# Patient Record
Sex: Male | Born: 1970 | Race: Black or African American | Hispanic: No | Marital: Married | Smoking: Never smoker
Health system: Southern US, Community
[De-identification: ages and names within clinical notes are randomized; demographics above are authoritative.]

## PROBLEM LIST (undated history)

## (undated) DIAGNOSIS — M1990 Unspecified osteoarthritis, unspecified site: Secondary | ICD-10-CM

## (undated) DIAGNOSIS — F17201 Nicotine dependence, unspecified, in remission: Secondary | ICD-10-CM

## (undated) DIAGNOSIS — E785 Hyperlipidemia, unspecified: Secondary | ICD-10-CM

## (undated) DIAGNOSIS — I350 Nonrheumatic aortic (valve) stenosis: Secondary | ICD-10-CM

## (undated) DIAGNOSIS — J90 Pleural effusion, not elsewhere classified: Secondary | ICD-10-CM

## (undated) DIAGNOSIS — J189 Pneumonia, unspecified organism: Secondary | ICD-10-CM

## (undated) DIAGNOSIS — N186 End stage renal disease: Secondary | ICD-10-CM

## (undated) DIAGNOSIS — I10 Essential (primary) hypertension: Secondary | ICD-10-CM

## (undated) DIAGNOSIS — I471 Supraventricular tachycardia, unspecified: Secondary | ICD-10-CM

## (undated) DIAGNOSIS — E109 Type 1 diabetes mellitus without complications: Secondary | ICD-10-CM

## (undated) DIAGNOSIS — K219 Gastro-esophageal reflux disease without esophagitis: Secondary | ICD-10-CM

## (undated) DIAGNOSIS — I5030 Unspecified diastolic (congestive) heart failure: Secondary | ICD-10-CM

## (undated) DIAGNOSIS — I509 Heart failure, unspecified: Secondary | ICD-10-CM

## (undated) DIAGNOSIS — Z992 Dependence on renal dialysis: Secondary | ICD-10-CM

## (undated) HISTORY — DX: Essential (primary) hypertension: I10

## (undated) HISTORY — DX: Unspecified diastolic (congestive) heart failure: I50.30

## (undated) HISTORY — DX: Supraventricular tachycardia: I47.1

## (undated) HISTORY — PX: EYE SURGERY: SHX253

## (undated) HISTORY — DX: Nonrheumatic aortic (valve) stenosis: I35.0

## (undated) HISTORY — DX: Nicotine dependence, unspecified, in remission: F17.201

## (undated) HISTORY — PX: INGUINAL HERNIA REPAIR: SUR1180

## (undated) HISTORY — DX: Hyperlipidemia, unspecified: E78.5

## (undated) HISTORY — DX: Supraventricular tachycardia, unspecified: I47.10

## (undated) HISTORY — DX: Unspecified osteoarthritis, unspecified site: M19.90

## (undated) HISTORY — DX: Heart failure, unspecified: I50.9

## (undated) HISTORY — DX: Type 1 diabetes mellitus without complications: E10.9

## (undated) HISTORY — PX: CARDIAC CATHETERIZATION: SHX172

---
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
## 1988-02-12 DIAGNOSIS — E109 Type 1 diabetes mellitus without complications: Secondary | ICD-10-CM

## 1988-02-12 HISTORY — DX: Type 1 diabetes mellitus without complications: E10.9

## 1998-02-11 DIAGNOSIS — E785 Hyperlipidemia, unspecified: Secondary | ICD-10-CM

## 1998-02-11 DIAGNOSIS — I10 Essential (primary) hypertension: Secondary | ICD-10-CM

## 1998-02-11 HISTORY — DX: Hyperlipidemia, unspecified: E78.5

## 1998-02-11 HISTORY — DX: Essential (primary) hypertension: I10

## 2005-06-25 ENCOUNTER — Encounter (HOSPITAL_COMMUNITY): Admission: RE | Admit: 2005-06-25 | Discharge: 2005-07-25 | Payer: Self-pay | Admitting: Family Medicine

## 2010-09-26 ENCOUNTER — Other Ambulatory Visit: Payer: Self-pay

## 2010-09-26 ENCOUNTER — Encounter: Payer: Self-pay | Admitting: *Deleted

## 2010-09-26 ENCOUNTER — Emergency Department (HOSPITAL_COMMUNITY): Payer: BC Managed Care – PPO

## 2010-09-26 ENCOUNTER — Observation Stay (HOSPITAL_COMMUNITY)
Admission: EM | Admit: 2010-09-26 | Discharge: 2010-09-27 | Disposition: A | Discharge status: Home / Self Care | Payer: BC Managed Care – PPO | Attending: Internal Medicine | Admitting: Internal Medicine

## 2010-09-26 DIAGNOSIS — R079 Chest pain, unspecified: Secondary | ICD-10-CM

## 2010-09-26 DIAGNOSIS — I059 Rheumatic mitral valve disease, unspecified: Secondary | ICD-10-CM

## 2010-09-26 DIAGNOSIS — I5032 Chronic diastolic (congestive) heart failure: Secondary | ICD-10-CM

## 2010-09-26 DIAGNOSIS — E785 Hyperlipidemia, unspecified: Secondary | ICD-10-CM | POA: Insufficient documentation

## 2010-09-26 DIAGNOSIS — Z794 Long term (current) use of insulin: Secondary | ICD-10-CM | POA: Insufficient documentation

## 2010-09-26 DIAGNOSIS — E782 Mixed hyperlipidemia: Secondary | ICD-10-CM

## 2010-09-26 DIAGNOSIS — N186 End stage renal disease: Secondary | ICD-10-CM

## 2010-09-26 DIAGNOSIS — E1065 Type 1 diabetes mellitus with hyperglycemia: Secondary | ICD-10-CM | POA: Insufficient documentation

## 2010-09-26 DIAGNOSIS — IMO0002 Reserved for concepts with insufficient information to code with codable children: Secondary | ICD-10-CM | POA: Insufficient documentation

## 2010-09-26 DIAGNOSIS — E1022 Type 1 diabetes mellitus with diabetic chronic kidney disease: Secondary | ICD-10-CM

## 2010-09-26 DIAGNOSIS — I509 Heart failure, unspecified: Secondary | ICD-10-CM | POA: Insufficient documentation

## 2010-09-26 DIAGNOSIS — E162 Hypoglycemia, unspecified: Secondary | ICD-10-CM

## 2010-09-26 DIAGNOSIS — R0789 Other chest pain: Principal | ICD-10-CM | POA: Insufficient documentation

## 2010-09-26 DIAGNOSIS — I10 Essential (primary) hypertension: Secondary | ICD-10-CM

## 2010-09-26 LAB — CARDIAC PANEL(CRET KIN+CKTOT+MB+TROPI)
CK, MB: 5.2 ng/mL — ABNORMAL HIGH (ref 0.3–4.0)
Relative Index: 2.4 (ref 0.0–2.5)
Relative Index: 2.5 (ref 0.0–2.5)
Total CK: 213 U/L (ref 7–232)
Total CK: 288 U/L — ABNORMAL HIGH (ref 7–232)
Troponin I: <0.3 ng/mL (ref <0.30)
Troponin I: <0.3 ng/mL (ref <0.30)

## 2010-09-26 LAB — BASIC METABOLIC PANEL
BUN: 25 mg/dL — ABNORMAL HIGH (ref 6–23)
Chloride: 105 mEq/L (ref 96–112)
Glucose, Bld: 26 mg/dL — CL (ref 70–99)
Potassium: 3.4 mEq/L — ABNORMAL LOW (ref 3.5–5.1)

## 2010-09-26 LAB — DIFFERENTIAL
Lymphocytes Relative: 40 % (ref 12–46)
Monocytes Absolute: 0.7 10*3/uL (ref 0.1–1.0)
Monocytes Relative: 8 % (ref 3–12)
Neutro Abs: 4.4 10*3/uL (ref 1.7–7.7)

## 2010-09-26 LAB — CBC
HCT: 42.6 % (ref 39.0–52.0)
Hemoglobin: 15 g/dL (ref 13.0–17.0)
MCHC: 35.2 g/dL (ref 30.0–36.0)
MCV: 86.4 fL (ref 78.0–100.0)
WBC: 9 10*3/uL (ref 4.0–10.5)

## 2010-09-26 LAB — GLUCOSE, CAPILLARY

## 2010-09-26 MED ORDER — MORPHINE SULFATE 2 MG/ML IJ SOLN: 1.0000 mg | INTRAMUSCULAR | Status: DC | PRN

## 2010-09-26 MED ORDER — ASPIRIN EC 81 MG PO TBEC
81.0000 mg | DELAYED_RELEASE_TABLET | Freq: Every day | ORAL | Status: DC
  Administered 2010-09-26 – 2010-09-27 (×2): 81 mg via ORAL
  Filled 2010-09-26 (×2): qty 1

## 2010-09-26 MED ORDER — ONDANSETRON HCL 4 MG/2ML IJ SOLN: 4.0000 mg | Freq: Four times a day (QID) | INTRAMUSCULAR | Status: DC | PRN

## 2010-09-26 MED ORDER — AMLODIPINE BESYLATE 5 MG PO TABS
10.0000 mg | ORAL_TABLET | Freq: Every day | ORAL | Status: DC
  Administered 2010-09-27: 10 mg via ORAL
  Filled 2010-09-26 (×2): qty 2

## 2010-09-26 MED ORDER — INSULIN ASPART 100 UNIT/ML ~~LOC~~ SOLN
4.0000 [IU] | Freq: Three times a day (TID) | SUBCUTANEOUS | Status: DC
  Administered 2010-09-26 – 2010-09-27 (×4): 4 [IU] via SUBCUTANEOUS

## 2010-09-26 MED ORDER — SIMVASTATIN 20 MG PO TABS
20.0000 mg | ORAL_TABLET | Freq: Every day | ORAL | Status: DC
  Administered 2010-09-26: 20 mg via ORAL
  Filled 2010-09-26: qty 1

## 2010-09-26 MED ORDER — ENOXAPARIN SODIUM 40 MG/0.4ML ~~LOC~~ SOLN
40.0000 mg | SUBCUTANEOUS | Status: DC
  Administered 2010-09-26: 40 mg via SUBCUTANEOUS
  Filled 2010-09-26: qty 0.4

## 2010-09-26 MED ORDER — ASPIRIN 325 MG PO TABS
324.0000 mg | ORAL_TABLET | Freq: Once | ORAL | Status: DC
  Administered 2010-09-26: 324 mg via ORAL

## 2010-09-26 MED ORDER — INSULIN GLARGINE 100 UNIT/ML ~~LOC~~ SOLN
20.0000 [IU] | Freq: Every day | SUBCUTANEOUS | Status: DC
  Administered 2010-09-26: 20 [IU] via SUBCUTANEOUS
  Filled 2010-09-26: qty 3

## 2010-09-26 MED ORDER — INSULIN ASPART 100 UNIT/ML ~~LOC~~ SOLN
0.0000 [IU] | Freq: Three times a day (TID) | SUBCUTANEOUS | Status: DC
  Administered 2010-09-26: 11 [IU] via SUBCUTANEOUS
  Administered 2010-09-26: 2 [IU] via SUBCUTANEOUS
  Administered 2010-09-27: 15 [IU] via SUBCUTANEOUS
  Administered 2010-09-27: 8 [IU] via SUBCUTANEOUS
  Filled 2010-09-26: qty 3

## 2010-09-26 MED ORDER — OXYCODONE HCL 5 MG PO TABS: 5.0000 mg | ORAL_TABLET | ORAL | Status: DC | PRN

## 2010-09-26 MED ORDER — ACETAMINOPHEN 325 MG PO TABS: 650.0000 mg | ORAL_TABLET | Freq: Four times a day (QID) | ORAL | Status: DC | PRN

## 2010-09-26 MED ORDER — AMLODIPINE-VALSARTAN-HCTZ 10-320-25 MG PO TABS
1.0000 | ORAL_TABLET | Freq: Every day | ORAL | Status: DC
Start: 2010-09-26 — End: 2010-09-26

## 2010-09-26 MED ORDER — ONDANSETRON HCL 4 MG PO TABS: 4.0000 mg | ORAL_TABLET | Freq: Four times a day (QID) | ORAL | Status: DC | PRN

## 2010-09-26 MED ORDER — SENNA 8.6 MG PO TABS: 2.0000 | ORAL_TABLET | Freq: Every day | ORAL | Status: DC | PRN

## 2010-09-26 MED ORDER — LABETALOL HCL 300 MG PO TABS: 450.0000 mg | ORAL_TABLET | Freq: Two times a day (BID) | ORAL | Status: DC

## 2010-09-26 MED ORDER — LABETALOL HCL 200 MG PO TABS
400.0000 mg | ORAL_TABLET | Freq: Two times a day (BID) | ORAL | Status: DC
  Administered 2010-09-26 – 2010-09-27 (×2): 400 mg via ORAL
  Filled 2010-09-26 (×3): qty 2

## 2010-09-26 MED ORDER — OLMESARTAN MEDOXOMIL 20 MG PO TABS
40.0000 mg | ORAL_TABLET | Freq: Every day | ORAL | Status: DC
  Administered 2010-09-27: 40 mg via ORAL
  Filled 2010-09-26 (×2): qty 2

## 2010-09-26 MED ORDER — ONDANSETRON HCL 4 MG/2ML IJ SOLN
4.0000 mg | Freq: Once | INTRAMUSCULAR | Status: AC
  Administered 2010-09-26: 4 mg via INTRAVENOUS
  Filled 2010-09-26: qty 2

## 2010-09-26 MED ORDER — HYDROCHLOROTHIAZIDE 25 MG PO TABS
25.0000 mg | ORAL_TABLET | Freq: Every day | ORAL | Status: DC
  Administered 2010-09-27: 25 mg via ORAL
  Filled 2010-09-26 (×2): qty 1

## 2010-09-26 MED ORDER — ASPIRIN 81 MG PO CHEW
CHEWABLE_TABLET | ORAL | Status: AC
  Administered 2010-09-26: 324 mg
  Filled 2010-09-26: qty 4

## 2010-09-26 MED ORDER — LABETALOL HCL 100 MG PO TABS
50.0000 mg | ORAL_TABLET | Freq: Two times a day (BID) | ORAL | Status: DC
  Administered 2010-09-26 – 2010-09-27 (×2): 50 mg via ORAL
  Filled 2010-09-26 (×5): qty 1

## 2010-09-26 MED ORDER — SODIUM CHLORIDE 0.9 % IV SOLN
INTRAVENOUS | Status: DC
  Administered 2010-09-26: 13:00:00 via INTRAVENOUS

## 2010-09-26 MED ORDER — DEXTROSE 50 % IV SOLN
INTRAVENOUS | Status: AC
  Administered 2010-09-26: 07:00:00
  Filled 2010-09-26: qty 50

## 2010-09-26 MED ORDER — ACETAMINOPHEN 650 MG RE SUPP: 650.0000 mg | Freq: Four times a day (QID) | RECTAL | Status: DC | PRN

## 2010-09-26 MED ORDER — FAMOTIDINE IN NACL 20-0.9 MG/50ML-% IV SOLN
20.0000 mg | Freq: Once | INTRAVENOUS | Status: AC
  Administered 2010-09-26: 07:00:00 via INTRAVENOUS
  Filled 2010-09-26: qty 50

## 2010-09-26 MED ORDER — MORPHINE SULFATE 4 MG/ML IJ SOLN
4.0000 mg | Freq: Once | INTRAMUSCULAR | Status: AC
  Administered 2010-09-26: 4 mg via INTRAVENOUS
  Filled 2010-09-26: qty 1

## 2010-09-26 NOTE — Progress Notes (Signed)
*  PRELIMINARY RESULTS* Echocardiogram 2D Echocardiogram has been performed.  Tera Partridge 09/26/2010, 2:04 PM

## 2010-09-26 NOTE — Progress Notes (Signed)
CRITICAL VALUE ALERT  Critical value received:  CKMB 6.9  Date of notification:  09/26/10  Time of notification:  1223  Critical value read back: yes  Nurse who received alert:  Rennie Plowman, RN  MD notified (1st page):  Dr. Jerilee Hoh  Time of first page:  84    MD notified (2nd page):  Time of second page:  Responding MD:  Dr. Jerilee Hoh  Time MD responded:  1230  No new orders given, will continue to monitor patient.

## 2010-09-26 NOTE — Progress Notes (Signed)
INITIAL ADULT NUTRITION ASSESSMENT Date: 09/26/2010   Time: 04:41 PM Reason for Assessment: diet education for uncontrolled diabetes  ASSESSMENT: Male 40 y.o.  Dx: Chest pain  Hx:  Past Medical History  Diagnosis Date  . Diabetes mellitus   . Hypertension   . Hypercholesterolemia     Related Meds:     amLODipine 10 mg Daily  And    olmesartan 40 mg Daily  And    hydrochlorothiazide 25 mg Daily  aspirin    aspirin EC 81 mg Daily  dextrose    enoxaparin (LOVENOX) injection 40 mg Q24H  famotidine 20 mg Once  insulin aspart 0-15 Units TID WC  insulin aspart 4 Units TID WC  insulin glargine 20 Units QHS  labetalol 400 mg BID  And    labetalol 50 mg BID  morphine injection 4 mg Once  ondansetron 4 mg Once  simvastatin 20 mg q1800  DISCONTD: Amlodipine-Valsartan-HCTZ 1 tablet Daily  DISCONTD: aspirin 324 mg Once  DISCONTD: labetalol 450 mg BID     Ht: 6\' 3"  (190.5 cm)  Wt: 235 lb 0.2 oz (106.6 kg)  Ideal Wt:  84.5 kg % Ideal Wt: 126%  Usual Wt: 235# % Usual Wt: 100%  Body mass index is 29.37 kg/(m^2).  Food/Nutrition Related Hx: Pt has had diabetes for 23 years. Pt is followed by endocrinologist, Dr, [name], in [place]. Denies wt loss. Pt denies following a special diet at home. Admits to decreasing his alcohol intake. Pt reports that his glucose levels have been increasing at home; reports readings of 80-120 in the AM and 200-300 in the evening. Pt reports that his long work hours are his biggest barrier to optimal glycemic control (pt works an an Chief Financial Officer at [employer] and workdays last from 5 AM- 6:30 PM. Pt has received diabetes education several times over the years. Hgb A1c pending. Pt with good understanding of diet principles.  Labs:  CBC    Component Value Date/Time   WBC 9.0 09/26/2010 0646   RBC 4.93 09/26/2010 0646   HGB 15.0 09/26/2010 0646   HCT 42.6 09/26/2010 0646   PLT 238 09/26/2010 0646   MCV 86.4 09/26/2010 0646   MCH 30.4 09/26/2010 0646   MCHC 35.2 09/26/2010 0646   RDW 13.6 09/26/2010 0646   LYMPHSABS 3.6 09/26/2010 0646   MONOABS 0.7 09/26/2010 0646   EOSABS 0.2 09/26/2010 0646   BASOSABS 0.1 09/26/2010 0646     CMET     Component Value Date/Time   NA 139 09/26/2010 0646   K 3.4* 09/26/2010 0646   CL 105 09/26/2010 0646   CO2 23 09/26/2010 0646   GLUCOSE 26* 09/26/2010 0646   BUN 25* 09/26/2010 0646   CREATININE 1.34 09/26/2010 0646   CALCIUM 10.1 09/26/2010 0646   GFRNONAA 59* 09/26/2010 0646   GFRAA >60 09/26/2010 0646      Intake:   I/O this shift: In: 120 [P.O.:120] Out: -     Output:   Diet Order: Carb Control  Supplements/Tube Feeding: None at this time  IVF:    sodium chloride Last Rate: 75 mL/hr at 09/26/10 1256    Estimated Nutritional Needs:   Kcal:2424-2645 kcals daily Protein:85-107 grams protein daily Fluid:2.4-2.6 L fluid daily  NUTRITION DIAGNOSIS: -Inconsistent carbohydrate intake Status:  Ongoing  RELATED TO: long work hours  AS EVIDENCE BY: variable glucose readings, per pt report.  MONITORING/EVALUATION(Goals): -Pt will maintain current wt of 235# -Pt will meet greater than or equal to 75% of estimated energy  needs -Pt will consume 3 meals a day and not go more than 5 hours without eating -Pt will consume 4-5 carbohydrate servings per meal  EDUCATION NEEDS: -Education needs addressed  INTERVENTION: Reviewed diabetic diet principles. Emphasized plate method, sources of carbohydrate, carb counting, and having a regimented meal schedule. Provided "Type 1 Diabetes Nutrition Therapy", "Managing Your Diabetes", "Plate Method", and "Nutrition In [employer]. Provided flyer for outpatient diabetes class at [facility]. Expect fair compliance.  Dietitian #: [phone]  [place] Per approved criteria  -Obesity Unspecified    Alene Mires 09/26/2010, 4:58 PM

## 2010-09-26 NOTE — Progress Notes (Signed)
Inpatient Diabetes Program Recommendations  AACE/ADA: New Consensus Statement on Inpatient Glycemic Control (2009)  Target Ranges:  Prepandial:   less than 140 mg/dL      Peak postprandial:   less than 180 mg/dL (1-2 hours)      Critically ill patients:  140 - 180 mg/dL   Reason for Visit: Consult for uncontrolled diabetes  Inpatient Diabetes Program Recommendations Insulin - Basal: Lantus 20 units ordered qhs:  Titrate towards home basal dose of approx. 46 units daily   Insulin - Meal Coverage: Novolog 4 units TID ordered:  Titrate towards home meal coverage dose of approx.  10 units TID HgbA1C: A1C pending:  Will continue to monitor to determine glycemic control prior to visit

## 2010-09-26 NOTE — ED Notes (Signed)
hospitalist here to eval pt

## 2010-09-26 NOTE — ED Notes (Signed)
Dr. Lissa Hoard) paged.

## 2010-09-26 NOTE — ED Provider Notes (Addendum)
History    The patient presents for evaluation of chest pain which he localizes to the retrosternal area, reporting no radiation, and rates the intensity of the pain to be a 4/10 without shortness of breath, but with diaphoresis and nausea and vomiting. He describes the chest pain as being like a pressure. Of note the patient is an insulin dependent diabetic and took his insulin this morning but did not eat breakfast. On initial evaluation he was mildly lethargic and a capillary blood glucose tests showed a glucose of 31 mg/dL. The patient was given IV dextrose with prompt improvement of his lethargy. He does maintain that he still is having chest discomfort. He is also treated for hypertension and hyperlipidemia by his physician Dr. Jeanann Lewandowsky. He has no history of heart disease, previous chest pain, and no early family history of chest pain. He is a nonsmoker.   Chest pain began approximately 15 minutes before arrival at the emergency department while the patient was driving his car. CSN: [id number] Arrival date & time: 09/26/2010  6:37 AM  Chief Complaint  Patient presents with  . Chest Pain   Patient is a 40 y.o. male presenting with chest pain. The history is provided by the patient.  Chest Pain The chest pain began less than 1 hour ago. Chest pain occurs constantly. The chest pain is unchanged. Associated with: Nothing. At its most intense, the pain is at 4/10. The pain is currently at 4/10. The severity of the pain is moderate. The quality of the pain is described as pressure-like. The pain does not radiate. Exacerbated by: Nothing. Primary symptoms include nausea, vomiting and altered mental status. Pertinent negatives for primary symptoms include no fever, no fatigue, no syncope, no shortness of breath, no cough, no wheezing, no palpitations, no abdominal pain and no dizziness.  Pertinent negatives for associated symptoms include no numbness.  His past medical history is significant for  diabetes, hyperlipidemia and hypertension.  Pertinent negatives for past medical history include no MI.  Pertinent negatives for family medical history include: no early MI in family.     Past Medical History  Diagnosis Date  . Diabetes mellitus   . Hypertension   . Hypercholesterolemia     History reviewed. No pertinent past surgical history.  No family history on file.  History  Substance Use Topics  . Smoking status: Never Smoker   . Smokeless tobacco: Not on file  . Alcohol Use: Yes      Review of Systems  Constitutional: Negative for fever and fatigue.  HENT: Negative.   Eyes: Negative.   Respiratory: Negative for cough, shortness of breath and wheezing.   Cardiovascular: Positive for chest pain. Negative for palpitations and syncope.  Gastrointestinal: Positive for nausea and vomiting. Negative for abdominal pain.  Musculoskeletal: Negative.   Skin: Negative for color change, pallor, rash and wound.  Neurological: Negative for dizziness, syncope, light-headedness, numbness and headaches.  Psychiatric/Behavioral: Positive for decreased concentration and altered mental status.    Physical Exam  There were no vitals taken for this visit.  Physical Exam  Nursing note and vitals reviewed. Constitutional: He is oriented to person, place, and time. He appears well-developed and well-nourished. No distress.  HENT:  Head: Normocephalic and atraumatic.  Mouth/Throat: Oropharynx is clear and moist.  Eyes: EOM are normal. Pupils are equal, round, and reactive to light.  Neck: Normal range of motion. Neck supple. No JVD present. No tracheal deviation present.  Cardiovascular: Normal rate, regular rhythm, normal heart  sounds and intact distal pulses.  Exam reveals no gallop and no friction rub.   No murmur heard. Pulmonary/Chest: Effort normal and breath sounds normal. No stridor. No respiratory distress. He has no wheezes. He has no rales. He exhibits no tenderness.    Abdominal: Soft. Bowel sounds are normal. He exhibits no distension. There is no tenderness. There is no rebound and no guarding.  Musculoskeletal: Normal range of motion. He exhibits no edema and no tenderness.  Neurological: He is alert and oriented to person, place, and time. No cranial nerve deficit. Coordination normal.       Neurologic and psychiatric evaluations as documented are after infusion of D50 to treat hypoglycemia  Skin: Skin is warm. No rash noted. He is diaphoretic. No erythema. No pallor.  Psychiatric: He has a normal mood and affect.    ED Course  Procedures  MDM Myocardial infarction, unstable angina, coronary artery disease, gastrointestinal chest pain, musculoskeletal chest pain, hypoglycemia, dietary noncompliance  As the patient is not having an ST elevation myocardial infarction or with other indications for an urgent catheter lab intervention, and given his hypoglycemia and inadequate oral intake after his insulin dose this morning, I have elected to see the patient to treat his hypoglycemia. I will be evaluating him for myocardial injury, and although he is young and has no history of coronary artery disease, his symptoms are worrisome and he does have multiple risk factors for coronary artery disease. I anticipate needing to admit the patient for further monitoring and testing to evaluate his chest pain.   Charlena Cross, MD 09/26/10 0700  ED ECG REPORT   Date: 09/26/2010  EKG Time: 7:08 AM  Rate: 88  Rhythm: normal sinus rhythm,  there are no previous tracings available for comparison, normal sinus rhythm, nonspecific ST and T waves changes  Axis: normal  Intervals:none  ST&T Change: nonspecific   Narrative Interpretation: reassuring ekg              Charlena Cross, MD 09/26/10 0710

## 2010-09-26 NOTE — ED Notes (Signed)
Pt lethargic and diaphoretic, pt reports  he is insulin diabetic and has not eaten this am, CBG taken at this time, EDP in room

## 2010-09-26 NOTE — Progress Notes (Signed)
Admission skin assessment done with Christinia Gully, RN. Findings include: Dry/flaky BLE, LUE & Left chest "brand"/scar, scar to left lateral knee. No other abnormalities noted.

## 2010-09-26 NOTE — H&P (Signed)
PCP:   [name], MD, MD   Chief Complaint:  Chest pain, nausea, vomiting.  HPI: Lance [name] is a very pleasant 40 year old African American man with past medical history significant for type 1 diabetes mellitus, hypertension, hyperlipidemia, who presents to the hospital today with complaints of substernal chest pain. He states that this morning at about 6 AM he was in the driveway to [place] to order some breakfast, when he suddenly experienced severe nausea, diaphoresis and vomiting. He subsequently proceeded to have substernal chest pain without radiation. He was then brought into the emergency department. His CBG was noted to be 23 and was rapidly given an amp of D50. However, the chest pain failed to resolve with resolution of his hypoglycemic episode. Because of his risk factors for coronary artery disease emergency department has asked the hospitalist service to admit him for further evaluation and management and possibly a stress test.  Allergies:  No Known Allergies    Past Medical History  Diagnosis Date  . Diabetes mellitus   . Hypertension   . Hypercholesterolemia     History reviewed. No pertinent past surgical history.  Prior to Admission medications   Medication Sig Start Date End Date Taking? Authorizing Provider  Amlodipine-Valsartan-HCTZ (EXFORGE HCT) 10-320-25 MG TABS Take 1 tablet by mouth daily.     Yes Historical Provider, MD  insulin lispro protamine-insulin lispro (HUMALOG 75/25) (75-25) 100 UNIT/ML SUSP Inject 58-80 Units into the skin daily. 80 -150  58 units 150-300  68 units 300-400  80 units    Yes Historical Provider, MD  insulin NPH-insulin regular (NOVOLIN 50/50) (50-50) 100 UNIT/ML injection Inject 30 Units into the skin daily before supper.     Yes Historical Provider, MD  labetalol (NORMODYNE) 300 MG tablet Take 450 mg by mouth 2 (two) times daily. He takes 1 1/2 tablet daily to equal 450 mg   Yes Historical Provider, MD  lovastatin (MEVACOR) 40 MG  tablet Take 40 mg by mouth at bedtime.     Yes Historical Provider, MD  LOVASTATIN PO Take by mouth once.     Yes Historical Provider, MD    Social History:  reports that he has never smoked. He does not have any smokeless tobacco history on file. He reports that he drinks alcohol. He reports that he does not use illicit drugs.  No family history on file.  Review of Systems:  Constitutional: Denies fever, chills, diaphoresis, appetite change and fatigue.  HEENT: Denies photophobia, eye pain, redness, hearing loss, ear pain, congestion, sore throat, rhinorrhea, sneezing, mouth sores, trouble swallowing, neck pain, neck stiffness and tinnitus.   Respiratory: Denies SOB, DOE, cough, chest tightness,  and wheezing.   Cardiovascular: Denies chest pain, palpitations and leg swelling.  Gastrointestinal: Denies nausea, vomiting, abdominal pain, diarrhea, constipation, blood in stool and abdominal distention.  Genitourinary: Denies dysuria, urgency, frequency, hematuria, flank pain and difficulty urinating.  Musculoskeletal: Denies myalgias, back pain, joint swelling, arthralgias and gait problem.  Skin: Denies pallor, rash and wound.  Neurological: Denies dizziness, seizures, syncope, weakness, light-headedness, numbness and headaches.  Hematological: Denies adenopathy. Easy bruising, personal or family bleeding history  Psychiatric/Behavioral: Denies suicidal ideation, mood changes, confusion, nervousness, sleep disturbance and agitation   Physical Exam: Gen.: Alert awake oriented x3 does not appear to be in acute distress. No current chest pain.  HEENT: Normocephalic atraumatic, pupils are equally round and reactive to light and accommodation, extraocular movements are intact. Neck: Supple, no JVD, no lymphadenopathy, no bruits, no goiter. Heart:  Regular rate and rhythm, no murmurs rubs or gallops are auscultated. Lungs: Clear to auscultation bilaterally. Abdomen: Soft, nontender,  nondistended, positive bowel sounds, no masses or organomegaly are noted. Extremities: No clubbing, no cyanosis, no edema. Positive pedal pulses. Neuro: Grossly intact and nonfocal.   Labs on Admission:  Results for orders placed during the hospital encounter of 09/26/10 (from the past 48 hour(s))  GLUCOSE, CAPILLARY     Status: Abnormal   Collection Time   09/26/10  6:39 AM      Component Value Range Comment   Glucose-Capillary 23 (*) 70 - 99 (mg/dL)    Comment 1 Repeat Test     GLUCOSE, CAPILLARY     Status: Abnormal   Collection Time   09/26/10  6:41 AM      Component Value Range Comment   Glucose-Capillary 31 (*) 70 - 99 (mg/dL)    Comment 1 Call MD NNP PA CNM     CBC     Status: Normal   Collection Time   09/26/10  6:46 AM      Component Value Range Comment   WBC 9.0  4.0 - 10.5 (K/uL)    RBC 4.93  4.22 - 5.81 (MIL/uL)    Hemoglobin 15.0  13.0 - 17.0 (g/dL)    HCT 42.6  39.0 - 52.0 (%)    MCV 86.4  78.0 - 100.0 (fL)    MCH 30.4  26.0 - 34.0 (pg)    MCHC 35.2  30.0 - 36.0 (g/dL)    RDW 13.6  11.5 - 15.5 (%)    Platelets 238  150 - 400 (K/uL)   DIFFERENTIAL     Status: Normal   Collection Time   09/26/10  6:46 AM      Component Value Range Comment   Neutrophils Relative 49  43 - 77 (%)    Neutro Abs 4.4  1.7 - 7.7 (K/uL)    Lymphocytes Relative 40  12 - 46 (%)    Lymphs Abs 3.6  0.7 - 4.0 (K/uL)    Monocytes Relative 8  3 - 12 (%)    Monocytes Absolute 0.7  0.1 - 1.0 (K/uL)    Eosinophils Relative 2  0 - 5 (%)    Eosinophils Absolute 0.2  0.0 - 0.7 (K/uL)    Basophils Relative 1  0 - 1 (%)    Basophils Absolute 0.1  0.0 - 0.1 (K/uL)   TROPONIN I     Status: Normal   Collection Time   09/26/10  6:46 AM      Component Value Range Comment   Troponin I <0.30  <0.30 (ng/mL)   BASIC METABOLIC PANEL     Status: Abnormal   Collection Time   09/26/10  6:46 AM      Component Value Range Comment   Sodium 139  135 - 145 (mEq/L)    Potassium 3.4 (*) 3.5 - 5.1 (mEq/L)     Chloride 105  96 - 112 (mEq/L)    CO2 23  19 - 32 (mEq/L)    Glucose, Bld 26 (*) 70 - 99 (mg/dL)    BUN 25 (*) 6 - 23 (mg/dL)    Creatinine, Ser 1.34  0.50 - 1.35 (mg/dL)    Calcium 10.1  8.4 - 10.5 (mg/dL)    GFR calc non Af Amer 59 (*) >60 (mL/min)    GFR calc Af Amer >60  >60 (mL/min)   GLUCOSE, CAPILLARY     Status: Abnormal  Collection Time   09/26/10  6:49 AM      Component Value Range Comment   Glucose-Capillary 140 (*) 70 - 99 (mg/dL)    Comment 1 Documented in Chart       Radiological Exams on Admission: CHEST - 1 VIEW  Comparison: None.  Findings: 0700 hours. There are low lung volumes with mild  bibasilar atelectasis. The lungs are otherwise clear. There is no  pleural effusion or pneumothorax. Heart size and mediastinal  contours are unremarkable for technique.  IMPRESSION:  Mild bibasilar atelectasis.    Assessment/Plan 1.  Chest pain: Most likely chest pain, nausea and vomiting are related to hypoglycemic episode. However, because of his multiple coronary artery disease risk factors, I believe it is prudent to admit him for 23 hour observation stay to rule out acute coronary syndrome. I will also consult cardiology for consideration of a stress test, be it inpatient or outpatient. Will place him on 81 mg of aspirin. 2.  DM (diabetes mellitus), type 1, uncontrolled: He seems to be on a very aggressive insulin regimen. At this point I will elect to transition him to a regimen of Lantus and to use only fast acting insulin as a sliding scale instead of a combination insulin. We'll see how his CBGs respond to this. 3.  Hypoglycemia: Please see above for details. 4.  HTN (hypertension): Currently well controlled. We'll continue his home medications. 5.  Hyperlipidemia: Check a fasting lipid profile and continue his home dose of statin. 6. DVT prophylaxis: We'll place him on Lovenox   Lance [name],Lance [name] 09/26/2010, 8:36 AM

## 2010-09-26 NOTE — ED Notes (Signed)
edp back in to re-eval and discuss plan of care. Pt to be admit to hospital. Pt and family aware

## 2010-09-26 NOTE — ED Notes (Signed)
Pt reports substernal cp starting while driving approx 15 min ago, pt also c/o nausea and vomiting

## 2010-09-27 ENCOUNTER — Other Ambulatory Visit: Payer: Self-pay

## 2010-09-27 DIAGNOSIS — I5032 Chronic diastolic (congestive) heart failure: Secondary | ICD-10-CM

## 2010-09-27 LAB — LIPID PANEL: Cholesterol: 322 mg/dL — ABNORMAL HIGH (ref 0–200)

## 2010-09-27 LAB — GLUCOSE, CAPILLARY
Glucose-Capillary: 408 mg/dL — ABNORMAL HIGH (ref 70–99)
Glucose-Capillary: 280 mg/dL — ABNORMAL HIGH (ref 70–99)

## 2010-09-27 LAB — GLUCOSE, RANDOM: Glucose, Bld: 391 mg/dL — ABNORMAL HIGH (ref 70–99)

## 2010-09-27 LAB — BASIC METABOLIC PANEL
BUN: 27 mg/dL — ABNORMAL HIGH (ref 6–23)
Calcium: 9.1 mg/dL (ref 8.4–10.5)
GFR calc non Af Amer: >60 mL/min (ref >60)
Glucose, Bld: 407 mg/dL — ABNORMAL HIGH (ref 70–99)
Sodium: 133 mEq/L — ABNORMAL LOW (ref 135–145)

## 2010-09-27 LAB — CBC
MCH: 30.2 pg (ref 26.0–34.0)
MCHC: 34.5 g/dL (ref 30.0–36.0)
Platelets: 194 10*3/uL (ref 150–400)

## 2010-09-27 MED ORDER — ASPIRIN 81 MG PO TBEC: 81.0000 mg | DELAYED_RELEASE_TABLET | Freq: Every day | ORAL | Status: AC

## 2010-09-27 MED ORDER — HYDRALAZINE HCL 20 MG/ML IJ SOLN
20.0000 mg | Freq: Once | INTRAMUSCULAR | Status: AC
  Administered 2010-09-27: 20 mg via INTRAVENOUS
  Filled 2010-09-27: qty 1

## 2010-09-27 NOTE — Progress Notes (Signed)
Reviewed discharge instructions with patient and family. Patient voiced understanding of discharge instructions. Patient escorted to car with Nurse Tech in stable condition.

## 2010-09-27 NOTE — Progress Notes (Signed)
Client's blood sugar reading was 408 before breakfast. RN ordered STAT venous glucose per protocol for verification. Dr. Jerilee Hoh was paged. MD returned page and gave orders to give NovoLog 15 units. Will continue to monitor patient.

## 2010-09-27 NOTE — Discharge Summary (Signed)
Physician Discharge Summary  Patient ID: Lance [name] MRN: [id number] DOB/AGE: [birth year] 40 y.o.  Admit date: 09/26/2010 Discharge date: 09/27/2010  Primary Care Physician:  [name], MD, MD   Discharge Diagnoses:    Patient Active Problem List  Diagnoses  . Chest pain  . DM (diabetes mellitus), type 1, uncontrolled  . Hypoglycemia  . HTN (hypertension)  . Hyperlipidemia  . Diastolic CHF, chronic    Current Discharge Medication List    START taking these medications   Details  aspirin EC 81 MG EC tablet Take 1 tablet (81 mg total) by mouth daily. Qty: 30 tablet, Refills: 11      CONTINUE these medications which have NOT CHANGED   Details  Amlodipine-Valsartan-HCTZ (EXFORGE HCT) 10-320-25 MG TABS Take 1 tablet by mouth daily.      insulin lispro protamine-insulin lispro (HUMALOG 75/25) (75-25) 100 UNIT/ML SUSP Inject 58-80 Units into the skin daily. 80 -150  58 units 150-300  68 units 300-400  80 units     insulin NPH-insulin regular (NOVOLIN 50/50) (50-50) 100 UNIT/ML injection Inject 30 Units into the skin daily before supper.      labetalol (NORMODYNE) 300 MG tablet Take 450 mg by mouth 2 (two) times daily. He takes 1 1/2 tablet daily to equal 450 mg    lovastatin (MEVACOR) 40 MG tablet Take 40 mg by mouth at bedtime.           Disposition and Follow-up: Patient will be discharged home today in stable and improved condition. Will need to followup with his primary care physician in one week to followup on blood pressure and blood sugar. He also has an appointment scheduled with the cardiologist on Monday, August 20 at 11 AM at that point they will decide on performing a stress test.  Consults:  none    Significant Diagnostic Studies:  Dg Chest 1 View  09/26/2010  *RADIOLOGY REPORT*  Clinical Data: Chest pain.  History of diabetes.  CHEST - 1 VIEW  Comparison: None.  Findings: 0700 hours.  There are low lung volumes with mild bibasilar atelectasis.   The lungs are otherwise clear.  There is no pleural effusion or pneumothorax.  Heart size and mediastinal contours are unremarkable for technique.  IMPRESSION: Mild bibasilar atelectasis.  Original Report Authenticated By: Vivia Ewing, M.D.   2D ECHO:  Left ventricle: The cavity size was normal. Wall thickness was increased in a pattern of mild LVH. Systolic function was vigorous. The estimated ejection fraction was in the range of 65% to 70%. Wall motion was normal; there were no regional wall motion abnormalities. Features are consistent with a pseudonormal left ventricular filling pattern, with concomitant abnormal relaxation and increased filling pressure (grade 2 diastolic dysfunction). - Aortic valve: Trileaflet; mildly calcified leaflets. - Mitral valve: Mild regurgitation. - Left atrium: The atrium was mildly dilated. - Tricuspid valve: Trivial regurgitation. - Pericardium, extracardiac: There was no pericardial effusion.     Hospital Course:   1.  Chest pain: Resolved. Has ruled out for acute coronary syndrome by the way of 3 sets of negative cardiac enzymes and EKGs that showed no acute ischemic changes. However, because he does have significant coronary artery disease risk factors including diabetes hypertension hyperlipidemia, I have scheduled him an outpatient appointment with cardiology for a possible stress test. I do believe however, that the chest pain nausea and vomiting were mostly caused by his hypoglycemic episode. 2.  Diastolic CHF, chronic: This is a new diagnosis as per  2-D echocardiogram ordered this admission. Main treatment for this problem is going to be tight and good blood pressure control. I have asked him to followup with his PCP within the next week to further titrate blood pressure medications to achieve good control. 3.  DM (diabetes mellitus), type 1, uncontrolled: With severe hypoglycemic episode on admission with blood sugars between 20 and 30. He is  on an interesting insulin regimen as dictated by his endocrinologist Dr. Jeanann Lewandowsky. On admission we stopped his home insulin and placed him on Lantus and sliding scale and he has had significant hyperglycemic episodes with sugars in the 300s and 400s. He is extremely reluctant to have his insulin regimen changed as he has been very well controlled with an A1c below 7. I have advised him that when he does his sliding scale he needs to eat within 15 minutes at the most, but otherwise have left his home insulin regimen unchanged. 4.  Hypoglycemia: See above for details 5.  HTN (hypertension): See above for detail 6.  Hyperlipidemia    Signed: HERNANDEZ ACOSTA,ESTELA 09/27/2010, 10:39 AM

## 2010-09-28 ENCOUNTER — Encounter: Payer: Self-pay | Admitting: Adult Health

## 2010-10-01 ENCOUNTER — Encounter: Payer: Self-pay | Admitting: Adult Health

## 2010-10-01 ENCOUNTER — Ambulatory Visit (INDEPENDENT_AMBULATORY_CARE_PROVIDER_SITE_OTHER): Payer: BC Managed Care – PPO | Admitting: Adult Health

## 2010-10-01 DIAGNOSIS — E785 Hyperlipidemia, unspecified: Secondary | ICD-10-CM

## 2010-10-01 DIAGNOSIS — I5032 Chronic diastolic (congestive) heart failure: Secondary | ICD-10-CM

## 2010-10-01 DIAGNOSIS — I10 Essential (primary) hypertension: Secondary | ICD-10-CM

## 2010-10-01 DIAGNOSIS — I509 Heart failure, unspecified: Secondary | ICD-10-CM

## 2010-10-01 DIAGNOSIS — R079 Chest pain, unspecified: Secondary | ICD-10-CM

## 2010-10-01 NOTE — Assessment & Plan Note (Signed)
His cholesterol status is not well controlled on current medication, lovastatin. Will start him on crestor 40mg  at HS. Samples of 20 mg two tables at [facility] are provided for him. He is to have follow-up labs in 6 weeks for reevaluation. He is counseled at length by Dr. Mar Daring who also saw this patient about cardiac risk factors and need to keep cholesterol controlled. We will see him on follow-up.

## 2010-10-01 NOTE — Patient Instructions (Signed)
Your physician recommends that you schedule a follow-up appointment in:  post test Your physician has recommended you make the following change in your medication: crestor 40mg  daily Your physician has requested that you have an exercise tolerance test. For further information please visit [email or web address]. Please also follow instruction sheet, as given.  Your physician recommends that you return for lab work in: 6 weeks

## 2010-10-01 NOTE — Assessment & Plan Note (Signed)
Review of echocardiogram completed on 09/26/2010 demonstrated EF of 65%-70% with vigorous wall motion and grade II diastolic dysfx.  I have discussed keeping BP well controlled and maybe adding additional medications. Dr. Verl Blalock wishes Dr. Jaci Standard to manage this.

## 2010-10-01 NOTE — Assessment & Plan Note (Signed)
Blood pressure is not currently controlled on medication regimen. We recommend use of alpha blocker, I.E clonidine or minoxidil as opposed to labetolol.  He is to follow-up with Dr. [name] as he is monitoring his BP.  He is advised to have a BP of 135 mmHg for diabetes range.

## 2010-10-01 NOTE — Assessment & Plan Note (Addendum)
Will plan GXT without nuclear evaluation at this time. If found to be positive, will plan for cardiac catheterization. This has been discussed with the patient who verbalizes understanding and is willing to proceed.

## 2010-10-01 NOTE — Progress Notes (Signed)
HPI: Lance [name] is a 40 y/o AAM who is here to be established in our clinic for multiple CVRF's to include diabetes, hypertension, hypercholesterolemia.  He was recently hospitalized on 09/26/2010 for complaints of chest pain that occurred after vomiting. He was seen in [facility] ED and was found to be hypertensive and hypoglycemic with BG of 30.  He was treated with medication adjustments and followed up with his PCP, Dr. Jeanann Lewandowsky.  On discharged he was asked to seek cardiac evaluation and possible stress test.  Since discharge, he has had no chest pain, no shortness of breath.  He is medically compliant.  He states his BP is not well controlled at home, with systolic ranges in 123456 range. He has been started on a new medication that he cannot remember the name of, by Dr. [name].  He is to finish out the normodyne first before starting has he has just gotten a new bottle.  No Known Allergies  Current Outpatient Prescriptions  Medication Sig Dispense Refill  . Amlodipine-Valsartan-HCTZ (EXFORGE HCT) 10-320-25 MG TABS Take 1 tablet by mouth daily.        Marland Kitchen aspirin EC 81 MG EC tablet Take 1 tablet (81 mg total) by mouth daily.  30 tablet  11  . insulin lispro protamine-insulin lispro (HUMALOG 75/25) (75-25) 100 UNIT/ML SUSP Inject 58-80 Units into the skin daily. 80 -150  58 units 150-300  68 units 300-400  80 units       . insulin NPH-insulin regular (NOVOLIN 50/50) (50-50) 100 UNIT/ML injection Inject 30 Units into the skin daily before supper.        . labetalol (NORMODYNE) 300 MG tablet Take 450 mg by mouth 2 (two) times daily. He takes 1 1/2 tablet daily to equal 450 mg      . lovastatin (MEVACOR) 40 MG tablet Take 40 mg by mouth at bedtime.          Past Medical History  Diagnosis Date  . Diabetes mellitus   . Hypertension   . Hypercholesterolemia   . Chest pain     Past Surgical History  Procedure Date  . No past surgeries     [id number] of systems complete and found to be  negative unless listed above   PHYSICAL EXAM BP 175/93  Pulse 86  Resp 18  Ht 6\' 3"  (1.905 m)  Wt 235 lb 12.8 oz (106.958 kg)  BMI 29.47 kg/m2  SpO2 99% General: Well developed, well nourished, in no acute distress Head: Eyes PERRLA, No xanthomas.   Normal cephalic and atramatic  Lungs: Clear bilaterally to auscultation and percussion. Heart: HRRR S1 S2.  Pulses are 2+ & equal.            No carotid bruit. No JVD.  No abdominal bruits. No femoral bruits. Abdomen: Bowel sounds are positive, abdomen soft and non-tender without masses or                  Hernia's noted. Msk:  Back normal, normal gait. Normal strength and tone for age. Extremities: No clubbing, cyanosis or edema.  DP +1 Neuro: Alert and oriented X 3. Psych:  Good affect, responds appropriately EKG: NSR with LVH.  ASSESSMENT AND PLAN

## 2010-10-08 ENCOUNTER — Ambulatory Visit (INDEPENDENT_AMBULATORY_CARE_PROVIDER_SITE_OTHER): Payer: BC Managed Care – PPO | Admitting: *Deleted

## 2010-10-08 ENCOUNTER — Encounter: Payer: Self-pay | Admitting: *Deleted

## 2010-10-08 ENCOUNTER — Encounter (HOSPITAL_COMMUNITY): Payer: Self-pay | Admitting: Cardiology

## 2010-10-08 DIAGNOSIS — R079 Chest pain, unspecified: Secondary | ICD-10-CM

## 2010-10-08 DIAGNOSIS — I10 Essential (primary) hypertension: Secondary | ICD-10-CM

## 2010-10-08 MED ORDER — CLONIDINE HCL 0.2 MG/24HR TD PTWK: 1.0000 | MEDICATED_PATCH | TRANSDERMAL | Status: DC

## 2010-10-08 NOTE — Progress Notes (Signed)
Stress Lab Nurses Notes - Lance [name]  Lance [name] 10/08/2010  Reason for doing test: Chest Pain and  and diabetic  Type of test: Regular GTX  Nurse performing test: Gerrit Halls, RN  Nuclear Medicine Tech: Not Applicable  Echo Tech: Not Applicable  MD performing test: R. Lattie Haw  Family MD: Mickie Hillier  Test explained and consent signed: yes  IV started: No IV started  Symptoms: Fatigue in legs  Treatment/Intervention: None  Reason test stopped: fatigue  After recovery IV was: [name]  Patient to return to [facility]. Med at :[name]  Patient discharged: Home  Patient's Condition upon discharge was: stable  Comments: Symptoms resolved in recovery.  Peak BP 228/78 HR 110.  Post recovery BP160/78 & HR 92  Geanie Cooley T

## 2010-10-12 ENCOUNTER — Ambulatory Visit (INDEPENDENT_AMBULATORY_CARE_PROVIDER_SITE_OTHER): Payer: BC Managed Care – PPO | Admitting: Adult Health

## 2010-10-12 ENCOUNTER — Encounter: Payer: Self-pay | Admitting: Adult Health

## 2010-10-12 VITALS — BP 161/93 | HR 93 | Resp 18 | Ht 75.0 in | Wt 238.1 lb

## 2010-10-12 DIAGNOSIS — I10 Essential (primary) hypertension: Secondary | ICD-10-CM

## 2010-10-12 MED ORDER — CLONIDINE HCL 0.3 MG/24HR TD PTWK: 1.0000 | MEDICATED_PATCH | TRANSDERMAL | Status: DC

## 2010-10-12 NOTE — Assessment & Plan Note (Signed)
His BP is still in the range of 99991111 systolic on his home recordings. He denies any chest pain or dizziness.  Basically asymptomatic.  I will increase the clonidine to 0.3 mg patch. I did ask him about the possibility of sleep apnea, as his BP is higher in the am.  He denies snoring, but he may need to be evaluated for this. He is now on Exforge and increased dose of clonidine.  He was taken off of normodyne to avoid bradycardia. Will have labs completed on next visit.

## 2010-10-12 NOTE — Patient Instructions (Signed)
Your physician has recommended you make the following change in your medication: increase Clonidine patch to 0.3 mg   Your physician recommends that you schedule a follow-up appointment in: 1 month

## 2010-10-12 NOTE — Progress Notes (Signed)
HPI: [name] is a 40 y/o AAM who is here to be established in our clinic for multiple CVRF's to include diabetes, hypertension, hypercholesterolemia.  He was recently hospitalized on 09/26/2010 for complaints of chest pain that occurred after vomiting. He was seen in [facility] ED and was found to be hypertensive and hypoglycemic with BG of 30.  He was treated with medication adjustments and followed up with his PCP, Dr. Jeanann Lewandowsky.  On discharged he was asked to seek cardiac evaluation and possible stress test.  Since discharge, he has had no chest pain, no shortness of breath.  He is medically compliant.  He states his BP is not well controlled at home, with systolic ranges in 123456 range. We scheduled him for a GXT with Dr. Lattie Haw on last visit. As a result of hypertensive response, he was started on clonidine 0.2 mg patch daily.  He is here for follow-up and evaluation of BP. He states that his BP is good in the afternoon but is high in the morning when he awakens. He has brought with him his BP diary. No Known Allergies  Current Outpatient Prescriptions  Medication Sig Dispense Refill  . Amlodipine-Valsartan-HCTZ (EXFORGE HCT) 10-320-25 MG TABS Take 1 tablet by mouth daily.        Marland Kitchen aspirin EC 81 MG EC tablet Take 1 tablet (81 mg total) by mouth daily.  30 tablet  11  . insulin lispro protamine-insulin lispro (HUMALOG 75/25) (75-25) 100 UNIT/ML SUSP Inject 58-80 Units into the skin daily. 80 -150  58 units 150-300  68 units 300-400  80 units       . insulin NPH-insulin regular (NOVOLIN 50/50) (50-50) 100 UNIT/ML injection Inject 30 Units into the skin daily before supper.        . rosuvastatin (CRESTOR) 40 MG tablet Take 40 mg by mouth daily.        . cloNIDine (CATAPRES - DOSED IN MG/24 HR) 0.3 mg/24hr Place 1 patch (0.3 mg total) onto the skin every 7 (seven) days.  4 patch  6    Past Medical History  Diagnosis Date  . Diabetes mellitus 2000    no insulin; A1c of 6.8 in 2009  .  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 Hyperlipidemia 2000  . Aortic stenosis     Very mild in 05/2007  . PSVT (paroxysmal supraventricular tachycardia)     Nonsustained; asymptomatic; diagnosed by event recorder in 2006  . Degenerative joint disease     S/p left TKA  . Tobacco abuse, in remission     20 pack years; discontinued in 1985; bronchitic changes on chest x-ray and 2009  . CHF (congestive heart failure)     Normal EF and 05/2007; attributed to Actos vs. diastolic dysfunction; chest x-ray and 2009- cardiomegaly and vascular redistribution    Past Surgical History  Procedure Date  . Total knee arthroplasty     Left  . Lumbar spine surgery     Multiple procedures    [id number] of systems complete and found to be negative unless listed above   PHYSICAL EXAM BP 161/93  Pulse 93  Resp 18  Ht 6\' 3"  (1.905 m)  Wt 238 lb 1.9 oz (108.011 kg)  BMI 29.76 kg/m2  SpO2 99% General: Well developed, well nourished, in no acute distress Head: Eyes PERRLA, No xanthomas.   Normal cephalic and atramatic  Lungs: Clear bilaterally to auscultation and percussion. Heart: HRRR S1 S2. Soft S4  Pulses are 2+ &  equal.            No carotid bruit. No JVD.  No abdominal bruits. No femoral bruits. Abdomen: Bowel sounds are positive, abdomen soft and non-tender without masses or                  Hernia's noted. Msk:  Back normal, normal gait. Normal strength and tone for age. Extremities: No clubbing, cyanosis or edema.  DP +1 Neuro: Alert and oriented X 3. Psych:  Good affect, responds appropriately EKG: NSR with LVH.  ASSESSMENT AND PLAN

## 2010-10-18 NOTE — Progress Notes (Signed)
Graded Exercise Test  Clinical data: 40 year old gentleman with multiple cardiovascular risk factors and chest pain.  Stress data: Treadmill exercise performed to a workload of 7 METs and a heart rate of 146 bpm, 81% of age-predicted maximum.  Exercise discontinued due to dyspnea and fatigue; no chest pain reported.  Blood pressure increased from a resting value of 170/92 210/80 during exercise and 230/80 in recovery, a hypertensive response from a hypertensive baseline.  No arrhythmias identified.  EKG: Normal sinus rhythm; left atrial abnormality; slightly delayed R-wave progression; prominent voltage. Stress EKG: 0.5-1.3 mm of upsloping ST segment depression noted in the inferior leads as well as V5-V6.  Impression: Negative graded exercise test revealing impaired exercise capacity, a hypertensive blood pressure response and no electrocardiographic evidence for myocardial ischemia.  [name], M.D.

## 2010-11-12 ENCOUNTER — Encounter: Payer: Self-pay | Admitting: Cardiology

## 2010-11-12 ENCOUNTER — Ambulatory Visit (INDEPENDENT_AMBULATORY_CARE_PROVIDER_SITE_OTHER): Payer: BC Managed Care – PPO | Admitting: Cardiology

## 2010-11-12 DIAGNOSIS — I10 Essential (primary) hypertension: Secondary | ICD-10-CM

## 2010-11-12 DIAGNOSIS — R079 Chest pain, unspecified: Secondary | ICD-10-CM

## 2010-11-12 DIAGNOSIS — I509 Heart failure, unspecified: Secondary | ICD-10-CM

## 2010-11-12 DIAGNOSIS — I5032 Chronic diastolic (congestive) heart failure: Secondary | ICD-10-CM

## 2010-11-12 DIAGNOSIS — E1065 Type 1 diabetes mellitus with hyperglycemia: Secondary | ICD-10-CM

## 2010-11-12 MED ORDER — VALSARTAN-HYDROCHLOROTHIAZIDE 320-25 MG PO TABS: 1.0000 | ORAL_TABLET | Freq: Every day | ORAL | Status: DC

## 2010-11-12 MED ORDER — METOPROLOL SUCCINATE ER 100 MG PO TB24: 50.0000 mg | ORAL_TABLET | Freq: Every day | ORAL | Status: DC

## 2010-11-12 NOTE — Assessment & Plan Note (Signed)
Recent hemoglobin A1c level reportedly 8.0 with a goal of 6.5-7.  Dr. [name] and patient are making good strides with medical therapy.

## 2010-11-12 NOTE — Progress Notes (Signed)
HPI : [name] returns the office as scheduled for continued assessment and treatment of severe hypertension requiring a multidrug regimen with a history of congestive heart failure.  The patient has been asymptomatic of late with good exercise tolerance.  He has been using medication as prescribed, but blood pressure control has been inadequate.  He returns a diary of 20 readings with systolics typically between 150 and 99991111 and diastolics of [id number].  Due to the cost of his previous medication, which included Exforge, the latter combination was divided into amlodipine and valsartan/HCT.  Current Outpatient Prescriptions on File Prior to Visit  Medication Sig Dispense Refill  . aspirin EC 81 MG EC tablet Take 1 tablet (81 mg total) by mouth daily.  30 tablet  11  . cloNIDine (CATAPRES - DOSED IN MG/24 HR) 0.3 mg/24hr Place 1 patch (0.3 mg total) onto the skin every 7 (seven) days.  4 patch  6  . insulin lispro protamine-insulin lispro (HUMALOG 75/25) (75-25) 100 UNIT/ML SUSP Inject 58-80 Units into the skin daily. 80 -150  58 units 150-300  68 units 300-400  80 units       . insulin NPH-insulin regular (NOVOLIN 50/50) (50-50) 100 UNIT/ML injection Inject 30 Units into the skin daily before supper.        . rosuvastatin (CRESTOR) 40 MG tablet Take 40 mg by mouth daily.           No Known Allergies    Past medical history, social history, and family history reviewed and updated.  ROS: Denies exertional dyspnea, chest discomfort, orthopnea, PND, palpitations, lightheadedness or syncope.  PHYSICAL EXAM: BP 173/94  Pulse 91  Resp 18  Ht 6\' 3"  (1.905 m)  Wt 234 lb (106.142 kg)  BMI 29.25 kg/m2  SpO2 96%  General-Well developed; no acute distress Body habitus-muscular build; mildly overweight Neck-No JVD; no carotid bruits Lungs-clear lung fields; resonant to percussion Cardiovascular-normal PMI; normal S1 and S2 Abdomen-normal bowel sounds; soft and non-tender without masses or  organomegaly Musculoskeletal-No deformities, no cyanosis or clubbing Neurologic-Normal cranial nerves; symmetric strength and tone Skin-Warm, no significant lesions; mild chronic stasis changes in the lower extremities Extremities-distal pulses intact; trace edema; clonidine patch in place  ASSESSMENT AND PLAN:

## 2010-11-12 NOTE — Assessment & Plan Note (Addendum)
No recent chest discomfort.  This problem appears to be quiescent at present.

## 2010-11-12 NOTE — Assessment & Plan Note (Signed)
CHF is compensated, perhaps primarily as a result of better control of hypertension.

## 2010-11-12 NOTE — Assessment & Plan Note (Signed)
Blood pressure remains high despite treatment with maximal doses of 4 drugs.  Patient experienced a possible mild adverse effect with labetalol in the past.  Toprol will be started at a dose of 50 mg q.d. And titrated upward as tolerated.  This nice gentleman will return for blood pressure check in 2 weeks and to see me in 2 months.

## 2010-11-12 NOTE — Patient Instructions (Signed)
Your physician recommends that you schedule a follow-up appointment in: 2 months with Dr Lattie Haw Your physician recommends that you schedule a follow-up appointment in: 2 weeks with a Nurse Your physician has recommended you make the following change in your medication: Start Metoprolol Succ 100 mg 1/2 tab daily Please continue home Blood Pressure readings Please walk 4 days a week for exercise

## 2010-11-26 ENCOUNTER — Telehealth: Payer: Self-pay | Admitting: *Deleted

## 2010-11-26 ENCOUNTER — Ambulatory Visit (INDEPENDENT_AMBULATORY_CARE_PROVIDER_SITE_OTHER): Payer: BC Managed Care – PPO

## 2010-11-26 VITALS — BP 158/93 | HR 93

## 2010-11-26 DIAGNOSIS — I10 Essential (primary) hypertension: Secondary | ICD-10-CM

## 2010-11-26 MED ORDER — CLONIDINE HCL 0.2 MG PO TABS: 0.2000 mg | ORAL_TABLET | Freq: Two times a day (BID) | ORAL | Status: DC

## 2010-11-26 MED ORDER — SPIRONOLACTONE 25 MG PO TABS: 25.0000 mg | ORAL_TABLET | Freq: Every day | ORAL | Status: DC

## 2010-11-26 NOTE — Progress Notes (Signed)
If transdermal clonidine will not be tolerated due to skin irritation, change to oral clonidine 0.2 mg b.i.d. Aldactone 25 mg per day. BMet in 2 weeks with a repeat blood pressure check at that time

## 2010-11-26 NOTE — Progress Notes (Signed)
Pt presents for BP check.  Per pt values have been running 180's - 170's over 90's in the am and 190's over 100's in the pm.  HR has been stable in the 80's, despite taking medications as directed.  In addition, catapress patch is leaving scars with an appearance, as if he has been branded.  No other complaints noted.

## 2010-11-27 ENCOUNTER — Telehealth: Payer: Self-pay | Admitting: *Deleted

## 2010-11-27 NOTE — Telephone Encounter (Signed)
Attempted to contact pt regarding new medication instructions.  Message left

## 2010-11-27 NOTE — Telephone Encounter (Signed)
Patient advised of medication changes and BMET that will be due on 10/29.  Pt verbalizes understanding.

## 2010-12-11 ENCOUNTER — Encounter: Payer: Self-pay | Admitting: *Deleted

## 2010-12-13 ENCOUNTER — Encounter: Payer: Self-pay | Admitting: Adult Health

## 2010-12-13 ENCOUNTER — Ambulatory Visit (INDEPENDENT_AMBULATORY_CARE_PROVIDER_SITE_OTHER): Payer: BC Managed Care – PPO | Admitting: Adult Health

## 2010-12-13 VITALS — BP 174/94 | HR 99 | Resp 18 | Ht 75.0 in | Wt 234.0 lb

## 2010-12-13 DIAGNOSIS — I10 Essential (primary) hypertension: Secondary | ICD-10-CM

## 2010-12-13 MED ORDER — TERAZOSIN HCL 5 MG PO CAPS: 5.0000 mg | ORAL_CAPSULE | Freq: Every day | ORAL | Status: DC

## 2010-12-13 NOTE — Assessment & Plan Note (Signed)
I have reviewed his medications and his blood pressure recordings. BP is elevated her in the office. I discussed this with Dr. Lattie Haw on site, as he has seen this patient last for further recommendations. We will add Hytrin, 5 mg to medication regimen and discontinue spironolactone.    Dr. Lattie Haw would like him to be referred to Dr. Alene Mires of the [facility] Vascular clinic in [place], [place]. He requests that he be considered for renal artery ablation since he meets criteria for greater than 3  Antihypertensive medications with poor response.  I have discussed this with the patient and he is willing to proceed with this. He is out of work until his BP is under control, per employer. I have called Dr. Christy Sartorius office to make appointment. They will call us back to schedule at which time, we will call Mr. Osmanski back to inform him.  I have given him directions and Dr. Christy Sartorius physician page printed out for his review.  We will send all records to [facility] once appointment is made. Dr. Lattie Haw is available to speak with Dr. Albertine Patricia as well, by phone.   I have spent 30 minutes with this patient explaining the need for referral and answering questions.

## 2010-12-13 NOTE — Progress Notes (Signed)
HPI: [name] is a 40 y/o patient of Dr. Lattie Haw we are following for ongoing treatment of severe hypertension requiring multiple antihypertensives, but with no improvement in readings. He has been on several medication regimens with no affect on his blood pressure. He was on clonidine patch at 0.3 mg which showed the most improvement, however, he was not able to tolerate this because it burned his skin, leaving several scars on his chest. He was then returned to po clonidine 0.2 mg BID by Dr. Lattie Haw, and spironolactone was started at 25mg  and metoprolol was added at 50 mg daily.  He has been diligent about checking his BP.  His BP is increasing to 123XX123 and 99991111 systolic with the above changes. He is concerned that the pressure is getting worse instead of better.    No Known Allergies  Current Outpatient Prescriptions  Medication Sig Dispense Refill  . amLODipine (NORVASC) 10 MG tablet Take 10 mg by mouth daily.        Marland Kitchen aspirin EC 81 MG EC tablet Take 1 tablet (81 mg total) by mouth daily.  30 tablet  11  . cloNIDine (CATAPRES) 0.2 MG tablet Take 1 tablet (0.2 mg total) by mouth 2 (two) times daily.  60 tablet  12  . insulin lispro protamine-insulin lispro (HUMALOG 75/25) (75-25) 100 UNIT/ML SUSP Inject 58-80 Units into the skin daily. 80 -150  58 units 150-300  68 units 300-400  80 units       . insulin NPH-insulin regular (NOVOLIN 50/50) (50-50) 100 UNIT/ML injection Inject 30 Units into the skin daily before supper.        . metoprolol (TOPROL XL) 100 MG 24 hr tablet Take 0.5 tablets (50 mg total) by mouth daily.  45 tablet  1  . rosuvastatin (CRESTOR) 40 MG tablet Take 40 mg by mouth daily.        . valsartan-hydrochlorothiazide (DIOVAN-HCT) 320-25 MG per tablet Take 1 tablet by mouth daily.  90 tablet  1  . terazosin (HYTRIN) 5 MG capsule Take 1 capsule (5 mg total) by mouth at bedtime.  30 capsule  3    Past Medical History  Diagnosis Date  . Diabetes mellitus 2000    no insulin;  A1c of 6.8 in 2009  . 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 Hyperlipidemia 2000  . Aortic stenosis     Very mild in 05/2007  . PSVT (paroxysmal supraventricular tachycardia)     Nonsustained; asymptomatic; diagnosed by event recorder in 2006  . Degenerative joint disease     S/p left TKA  . Tobacco abuse, in remission     20 pack years; discontinued in 1985; bronchitic changes on chest x-ray and 2009  . CHF (congestive heart failure)     Normal EF and 05/2007; attributed to Actos vs. diastolic dysfunction; chest x-ray and 2009- cardiomegaly and vascular redistribution    Past Surgical History  Procedure Date  . Total knee arthroplasty     Left  . Lumbar spine surgery     Multiple procedures    Family History  Problem Relation Age of Onset  . Diabetes Father   . Diabetes Mother     History   Social History  . Marital Status: Married    Spouse Name: N/A    Number of Children: N/A  . Years of Education: N/A   Occupational History  . Not on file.   Social History Main Topics  . Smoking status: Never Smoker   .  Smokeless tobacco: Not on file  . Alcohol Use: Yes  . Drug Use: No  . Sexually Active: Yes   Other Topics Concern  . Not on file   Social History Narrative  . No narrative on file   ECHO 09/26/2010 Left ventricle: The cavity size was normal. Wall thickness was increased in a pattern of mild LVH. Systolic function was vigorous. The estimated ejection fraction was in the range of 65% to 70%. Wall motion was normal; there were no regional wall motion abnormalities. Features are consistent with a pseudonormal left ventricular filling pattern, with concomitant abnormal relaxation and increased filling pressure (grade 2 diastolic dysfunction). - Aortic valve: Trileaflet; mildly calcified leaflets. - Mitral valve: Mild regurgitation. - Left atrium: The atrium was mildly dilated. - Tricuspid valve: Trivial regurgitation. - Pericardium, extracardiac: There was no pericardial  effusion.  Labs:September 27, 2010 Findings  Result Name                              Result     Abnl   Normal Range     Units      Perf. Loc.  Sodium (NA)                              133        l      135-145          mEq/L  Potassium (K)                            4.3               3.5-5.1          mEq/L    DELTA CHECK NOTED  Chloride                                      102               96-112           mEq/L  CO2                                              18         l      19-32            mEq/L  Glucose                                      407        h      70-99            mg/dL  BUN                                               27         h      6-23  mg/dL  Creatinine                                   1.19              0.50-1.35        mg/dL  GFR, Est Non African American            >60               >60              mL/min  GFR, Est African American                    >60               >60              mL/min    Oversized comment, see footnote  1  Calcium                                        9.1               8.4-10.5         mg/dL Hemoglobin A1C                           11.2       h      4.6-6.1          %    Oversized comment, see footnote  1  Mean Plasma Glucose                      275        h                       Mg/dL WBC                                               6.8               4.0-10.5         K/uL  RBC                                             4.41              4.22-5.81        MIL/uL  Hemoglobin (HGB)                         13.3              13.0-17.0        g/dL  Hematocrit (HCT)                         38.5       l      39.0-52.0        %  MCV  87.3              78.0-100.0       fL  MCH -                                            30.2              26.0-34.0        pg  MCHC                                            34.5              30.0-36.0        g/dL  RDW                                               13.6              11.5-15.5        %  Platelet Count (PLT)                       194               150-400          K/uL _________________________________________________  [id number] of systems complete and found to be negative unless listed above  PHYSICAL EXAM BP 174/94  Pulse 99  Resp 18  Ht 6\' 3"  (1.905 m)  Wt 234 lb (106.142 kg)  BMI 29.25 kg/m2  General: Well developed, well nourished, in no acute distress Head: Eyes PERRLA, No xanthomas.   Normal cephalic and atramatic  Lungs: Clear bilaterally to auscultation and percussion. Heart: HRRR S1 S2, with soft S4 murmur  Pulses are 2+ & equal.            No carotid bruit. No JVD.  No abdominal bruits. No femoral bruits. Abdomen: Bowel sounds are positive, abdomen soft and non-tender without masses or                  Hernia's noted. Msk:  Back normal, normal gait. Normal strength and tone for age. Extremities: No clubbing, cyanosis or edema.  DP +1 Neuro: Alert and oriented X 3. Psych:  Good affect, responds appropriately    ASSESSMENT AND PLAN

## 2010-12-13 NOTE — Patient Instructions (Addendum)
Your physician has recommended you make the following change in your medication: stop taking Spirolactone and start taking Hytrin 5 mg daily  Your physician recommends that you see Dr Albertine Patricia at the [facility] in [place], [place]. We will contact you with appointment date and time.  Your physician recommends that you schedule a follow-up appointment in: After you see Dr. Albertine Patricia

## 2010-12-17 ENCOUNTER — Encounter: Payer: Self-pay | Admitting: Adult Health

## 2010-12-17 ENCOUNTER — Telehealth: Payer: Self-pay

## 2010-12-17 NOTE — Telephone Encounter (Signed)
Yes he can return to work.  Continue current medications.    Jory Sims NP

## 2010-12-17 NOTE — Telephone Encounter (Signed)
Pt. came into office this morning for a BP check. BP is 149/91 this morning and on last OV on 12-13-10 his BP was 174/94. Pt. wants to know if he can return to work. Please advise./LV

## 2011-01-13 ENCOUNTER — Encounter: Payer: Self-pay | Admitting: Cardiology

## 2011-01-15 ENCOUNTER — Ambulatory Visit (INDEPENDENT_AMBULATORY_CARE_PROVIDER_SITE_OTHER): Payer: BC Managed Care – PPO | Admitting: Cardiology

## 2011-01-15 ENCOUNTER — Encounter: Payer: Self-pay | Admitting: *Deleted

## 2011-01-15 ENCOUNTER — Encounter: Payer: Self-pay | Admitting: Cardiology

## 2011-01-15 DIAGNOSIS — E785 Hyperlipidemia, unspecified: Secondary | ICD-10-CM

## 2011-01-15 DIAGNOSIS — E1065 Type 1 diabetes mellitus with hyperglycemia: Secondary | ICD-10-CM

## 2011-01-15 DIAGNOSIS — I5032 Chronic diastolic (congestive) heart failure: Secondary | ICD-10-CM

## 2011-01-15 DIAGNOSIS — I509 Heart failure, unspecified: Secondary | ICD-10-CM

## 2011-01-15 DIAGNOSIS — R079 Chest pain, unspecified: Secondary | ICD-10-CM

## 2011-01-15 MED ORDER — METOPROLOL SUCCINATE ER 100 MG PO TB24: 100.0000 mg | ORAL_TABLET | Freq: Every day | ORAL | Status: DC

## 2011-01-15 MED ORDER — TERAZOSIN HCL 10 MG PO CAPS: 10.0000 mg | ORAL_CAPSULE | Freq: Every day | ORAL | Status: DC

## 2011-01-15 NOTE — Patient Instructions (Signed)
Your physician recommends that you schedule a follow-up appointment in: 2 months with Dr Lattie Haw and 3 weeks with nurse for blood pressure check  Your physician has recommended you make the following change in your medication:  1 - Increase Hytrin to 10 mg daily 2 - Increase Metoprolol to 100 mg daily x 1 week.  If heart rate is above 60, increase dose to 150 mg daily  Your physician has requested that you regularly monitor and record your blood pressure readings at home. Please use the same machine at the same time of day to check your readings and record them to bring to your follow-up visit.

## 2011-01-15 NOTE — Assessment & Plan Note (Signed)
Blood pressure control is improved, but still quite suboptimal.  Patient has developed some sedation and dry mouth on clonidine.  Accordingly, dosage will not be increased.  Metoprolol will be titrated upward, initially to 100 mg per day and Hytrin to 10 mg per day.  Patient will continue to measure blood pressures at home and return in 2 weeks for reassessment by the cardiology nurses.

## 2011-01-15 NOTE — Assessment & Plan Note (Signed)
Diabetes is managed by Dr. Wolfgang Phoenix with progressive improvement in control.

## 2011-01-15 NOTE — Assessment & Plan Note (Signed)
Patient has substantial elevation of total and LDL cholesterol, but also has a high HDL.  He was started on maximum dose of rosuvastatin with a repeat lipid profile pending.

## 2011-01-15 NOTE — Assessment & Plan Note (Signed)
Patient is asymptomatic at present.

## 2011-01-15 NOTE — Assessment & Plan Note (Signed)
Patient may well have diastolic dysfunction, but it is not clear that he truly has CHF.  Chronic pedal edema has been exacerbated by treatment with amlodipine, but the degree of edema is tolerable at present.

## 2011-01-15 NOTE — Progress Notes (Signed)
Patient ID: Lance [name], male   DOB: 1970/10/01, 40 y.o.   MRN: [id number] HPI: Lance [name] is seen in the office as scheduled for continued assessment and treatment of severe hypertension.  Since his last visit, he was evaluated by Dr. Albertine Patricia at the [facility] but unfortunately found to be  ineligible for investigational renal artery ablation as a result of type 1 diabetes.  He remains asymptomatic with respect to hypertension, but control remains elusive.  He returns with a list of 8 blood pressures taken at home.  Heart rate has been elevated, typically between 95 and 115.  Pressure has been fairly well controlled in the morning with systolics between [id number] and 99991111 and diastolics between 85 and 96; however, in the afternoon systolics have ranged from 123XX123 and diastolics from 99991111.   Recently, concern has been raised about weight gain and the possible contribution of high-dose insulin therapy.  Oral hypoglycemics have been added to the patient's regime, but he does not recall the name of the drug provided to him.  Prior to Admission medications   Medication Sig Start Date End Date Taking? Authorizing Provider  amLODipine (NORVASC) 10 MG tablet Take 10 mg by mouth daily.     Yes Historical Provider, MD  aspirin EC 81 MG EC tablet Take 1 tablet (81 mg total) by mouth daily. 09/27/10 09/27/11 Yes Estela Isaac Bliss  cloNIDine (CATAPRES) 0.2 MG tablet Take 1 tablet (0.2 mg total) by mouth 2 (two) times daily. 11/26/10 11/26/11 Yes Cristopher Estimable. Jeannene Tschetter, MD  insulin lispro protamine-insulin lispro (HUMALOG 75/25) (75-25) 100 UNIT/ML SUSP Inject 58-80 Units into the skin daily. 80 -150  58 units 150-300  68 units 300-400  80 units    Yes Historical Provider, MD  insulin NPH-insulin regular (NOVOLIN 50/50) (50-50) 100 UNIT/ML injection Inject 30 Units into the skin daily before supper.     Yes Historical Provider, MD  rosuvastatin (CRESTOR) 40 MG tablet Take 40 mg by mouth daily.     Yes  Historical Provider, MD  valsartan-hydrochlorothiazide (DIOVAN-HCT) 320-25 MG per tablet Take 1 tablet by mouth daily. 11/12/10  Yes Cristopher Estimable. Raelene Trew, MD  metoprolol (TOPROL-XL) 100 MG 24 hr tablet Take 1 tablet (100 mg total) by mouth daily. 01/15/11 01/15/12  Cristopher Estimable. Jeilyn Reznik, MD  terazosin (HYTRIN) 10 MG capsule Take 1 capsule (10 mg total) by mouth at bedtime. 01/15/11 01/15/12  Cristopher Estimable. Lattie Haw, MD  No Known Allergies    Past medical history, social history, and family history reviewed and updated.  ROS: Denies chest discomfort, dyspnea on exertion, orthopnea or PND.  He does have mild chronic pedal edema.  Diet is low-sodium.  PHYSICAL EXAM: BP 161/96  Pulse 97  Ht 6\' 3"  (1.905 m)  Wt 110.678 kg (244 lb)  BMI 30.50 kg/m2   General-Well developed; no acute distress Body habitus-muscular, but mildly overweight. Neck-No JVD; no carotid bruits Lungs-clear lung fields; resonant to percussion Cardiovascular-normal PMI; normal S1 and S2; grade 1/6 systolic murmur at the cardiac base Abdomen-normal bowel sounds; soft and non-tender without masses or organomegaly Musculoskeletal-No deformities, no cyanosis or clubbing Neurologic-Normal cranial nerves; symmetric strength and tone Skin-Warm, no significant lesions Extremities-distal pulses intact; 1-2+ ankle and pretibial edema  ASSESSMENT AND PLAN:  [name], MD 01/15/2011 11:26 AM

## 2011-01-17 ENCOUNTER — Telehealth: Payer: Self-pay | Admitting: *Deleted

## 2011-01-17 NOTE — Telephone Encounter (Signed)
Message left regarding medication clarification.

## 2011-02-01 ENCOUNTER — Ambulatory Visit (INDEPENDENT_AMBULATORY_CARE_PROVIDER_SITE_OTHER): Payer: BC Managed Care – PPO

## 2011-02-01 VITALS — BP 152/87 | HR 81 | Ht 75.0 in | Wt 244.0 lb

## 2011-02-01 DIAGNOSIS — I10 Essential (primary) hypertension: Secondary | ICD-10-CM

## 2011-03-18 ENCOUNTER — Encounter: Payer: Self-pay | Admitting: *Deleted

## 2011-03-18 ENCOUNTER — Encounter: Payer: Self-pay | Admitting: Cardiology

## 2011-03-18 ENCOUNTER — Ambulatory Visit (INDEPENDENT_AMBULATORY_CARE_PROVIDER_SITE_OTHER): Payer: BC Managed Care – PPO | Admitting: Cardiology

## 2011-03-18 VITALS — BP 182/99 | HR 94 | Resp 16 | Ht 75.0 in | Wt 251.0 lb

## 2011-03-18 DIAGNOSIS — E785 Hyperlipidemia, unspecified: Secondary | ICD-10-CM

## 2011-03-18 DIAGNOSIS — I10 Essential (primary) hypertension: Secondary | ICD-10-CM

## 2011-03-18 MED ORDER — LABETALOL HCL 200 MG PO TABS: ORAL_TABLET | ORAL | Status: DC

## 2011-03-18 MED ORDER — VERAPAMIL HCL ER 120 MG PO CP24: 120.0000 mg | ORAL_CAPSULE | Freq: Every day | ORAL | Status: DC

## 2011-03-18 MED ORDER — TERAZOSIN HCL 10 MG PO CAPS: 10.0000 mg | ORAL_CAPSULE | Freq: Two times a day (BID) | ORAL | Status: DC

## 2011-03-18 MED ORDER — LOSARTAN POTASSIUM-HCTZ 100-25 MG PO TABS: 1.0000 | ORAL_TABLET | Freq: Every day | ORAL | Status: DC

## 2011-03-18 NOTE — Assessment & Plan Note (Signed)
Fasting lipid profile will be reassessed on maximal dose of rosuvastatin.

## 2011-03-18 NOTE — Patient Instructions (Signed)
Your physician recommends that you schedule a follow-up appointment in:  6 months with Dr Lattie Haw 2 months for Blood Pressure Check  Your physician has requested that you regularly monitor and record your blood pressure readings at home. Please use the same machine at the same time of day to check your readings and record them to bring to your follow-up visit.  Your physician recommends that you return for lab work in: On or around March 4 th - You will receive a letter  Your physician has recommended you make the following change in your medication:  1 - STOP Metoprolol 2 - STOP Valsartan/HCT  3 - INCREASE Terazosin to 10 mg twice daily  4 - DECREASE Amlodipine to 5 mg daily  5 - START Labetalol 400 mg (2 tabs) twice daily 6 - START Verapamil 120 mg daily 7 - START Losartan/HCT 100/25 mg daily

## 2011-03-18 NOTE — Assessment & Plan Note (Addendum)
Pedal edema has developed and is likely related to treatment with amlodipine.  Dosage of that medicine will be decreased to 5 mg per day and verapamil 120 mg per day added.  The price of valsartan is outrageous.  Losartan/HCT will be substituted at a dose of 100/25 mg.  A chemistry profile will be obtained in one month.  Patient reports fatigue with metoprolol.  Labetalol will be substituted at a dose of 400 mg b.i.d.  His dose of terazosin will be increased to 10 mg twice a day.  He will continue to monitor home blood pressure, return for reassessment by the cardiology nurses in 3 months and by me in 6 months.

## 2011-03-18 NOTE — Assessment & Plan Note (Signed)
No symptoms to suggest myocardial ischemia since hospital discharge some months ago.

## 2011-03-18 NOTE — Assessment & Plan Note (Signed)
CHF has been compensated with better control of hypertension and a modest dose of diuretic.

## 2011-03-18 NOTE — Progress Notes (Signed)
Patient ID: Lance [name], male   DOB: August 04, 1970, 41 y.o.   MRN: [id number] HPI: Scheduled return visit for this very nice young gentleman with severe hypertension.  He has diligently followed blood pressure at home and documented substantial improvement.  Virtually all diastolics are lower than 90 while systolics are typically below [id number]; however, systolics in the range of 160-190 are not uncommon.  He continues to be asymptomatic.  His cost for valsartan/HCT recently increased from $30 to $150 a month.  He experienced excessive fatigue when he attempted to increase his dose of metoprolol to 150 mg per day and returned to the initial dose of 100 mg a day.  Prior to Admission medications   Medication Sig Start Date End Date Taking? Authorizing Provider  amLODipine (NORVASC) 10 MG tablet Take 10 mg by mouth daily.     Yes Historical Provider, MD  aspirin EC 81 MG EC tablet Take 1 tablet (81 mg total) by mouth daily. 09/27/10 09/27/11 Yes Estela Isaac Bliss, MD  cloNIDine (CATAPRES) 0.2 MG tablet Take 1 tablet (0.2 mg total) by mouth 2 (two) times daily. 11/26/10 11/26/11 Yes Cristopher Estimable. Katleen Carraway, MD  insulin lispro protamine-insulin lispro (HUMALOG 75/25) (75-25) 100 UNIT/ML SUSP Inject 58-80 Units into the skin daily. 80 -150  58 units 150-300  68 units 300-400  80 units    Yes Historical Provider, MD  insulin NPH-insulin regular (NOVOLIN 50/50) (50-50) 100 UNIT/ML injection Inject 30 Units into the skin daily before supper.     Yes Historical Provider, MD  metoprolol (TOPROL-XL) 100 MG 24 hr tablet Take 1 tablet (100 mg total) by mouth daily. 01/15/11 01/15/12 Yes Cristopher Estimable. Gwendola Hornaday, MD  rosuvastatin (CRESTOR) 40 MG tablet Take 40 mg by mouth daily.     Yes Historical Provider, MD  terazosin (HYTRIN) 10 MG capsule Take 1 capsule (10 mg total) by mouth at bedtime. 01/15/11 01/15/12 Yes Cristopher Estimable. Orli Degrave, MD  valsartan-hydrochlorothiazide (DIOVAN-HCT) 320-25 MG per tablet Take 1 tablet by mouth  daily. 11/12/10  Yes Cristopher Estimable. Lattie Haw, MD  No Known Allergies    Past medical history, social history, and family history reviewed and updated.  ROS: See history of present illness.  PHYSICAL EXAM: BP 182/99  Pulse 94  Resp 16  Ht 6\' 3"  (1.905 m)  Wt 113.853 kg (251 lb)  BMI 31.37 kg/m2  General-Well developed; no acute distress Body habitus-mildly overweight Neck-No JVD; no carotid bruits Lungs-clear lung fields; resonant to percussion Cardiovascular-normal PMI; normal S1 and S2 Abdomen-normal bowel sounds; soft and non-tender without masses or organomegaly Musculoskeletal-No deformities, no cyanosis or clubbing Neurologic-Normal cranial nerves; symmetric strength and tone Skin-Warm, no significant lesions Extremities-distal pulses intact; 1-2+ ankle edema  ASSESSMENT AND PLAN:  [name], MD 03/18/2011 1:57 PM

## 2011-04-03 ENCOUNTER — Other Ambulatory Visit: Payer: Self-pay | Admitting: *Deleted

## 2011-04-03 DIAGNOSIS — E782 Mixed hyperlipidemia: Secondary | ICD-10-CM

## 2011-04-03 DIAGNOSIS — I10 Essential (primary) hypertension: Secondary | ICD-10-CM

## 2011-04-11 ENCOUNTER — Telehealth: Payer: Self-pay | Admitting: *Deleted

## 2011-04-11 DIAGNOSIS — E782 Mixed hyperlipidemia: Secondary | ICD-10-CM

## 2011-04-11 DIAGNOSIS — I10 Essential (primary) hypertension: Secondary | ICD-10-CM

## 2011-04-19 ENCOUNTER — Encounter: Payer: Self-pay | Admitting: *Deleted

## 2011-05-27 ENCOUNTER — Ambulatory Visit (INDEPENDENT_AMBULATORY_CARE_PROVIDER_SITE_OTHER): Payer: BC Managed Care – PPO | Admitting: *Deleted

## 2011-05-27 VITALS — BP 202/110 | HR 94 | Ht 75.0 in | Wt 241.0 lb

## 2011-05-27 DIAGNOSIS — I10 Essential (primary) hypertension: Secondary | ICD-10-CM

## 2011-05-27 NOTE — Patient Instructions (Signed)
Your physician recommends that you schedule a follow-up appointment in: Blood pressure check in 1 month  Your physician has recommended you make the following change in your medication:  Take terazosin 2 tablets = 20 mg in am only/ None in the pm

## 2011-05-27 NOTE — Progress Notes (Signed)
Presents for blood pressure check.  Medications reconciled by list and pt states taking as prescribed.  Home blood pressures reveal one teens to 120's over 70/80 range in the am and 150's 160's in the evenings.  Pt is hypertensive in office today without complaints of symptoms.  Dr Lattie Haw present for consultation and recommended taking terazosin 20 mg daily in the am, continue to monitor blood pressures, obtaining more afternoon readings, with blood pressure check in 1 month.  I will also contact the patient on Monday to follow up and see if this change has made a difference in his pm readings.

## 2011-06-25 ENCOUNTER — Ambulatory Visit (HOSPITAL_COMMUNITY)
Admission: RE | Admit: 2011-06-25 | Discharge: 2011-06-25 | Disposition: A | Discharge status: Home / Self Care | Payer: BC Managed Care – PPO | Source: Ambulatory Visit | Attending: Family Medicine | Admitting: Family Medicine

## 2011-06-25 ENCOUNTER — Other Ambulatory Visit: Payer: Self-pay | Admitting: Family Medicine

## 2011-06-25 DIAGNOSIS — M25559 Pain in unspecified hip: Secondary | ICD-10-CM

## 2011-07-01 ENCOUNTER — Ambulatory Visit (INDEPENDENT_AMBULATORY_CARE_PROVIDER_SITE_OTHER): Payer: BC Managed Care – PPO | Admitting: *Deleted

## 2011-07-01 VITALS — BP 203/110 | HR 98 | Ht 75.0 in | Wt 234.0 lb

## 2011-07-01 DIAGNOSIS — I10 Essential (primary) hypertension: Secondary | ICD-10-CM

## 2011-07-01 NOTE — Progress Notes (Signed)
Patient presents for a blood pressure check.  Medication list reviewed.  States taking all medications as prescribed, with the exception of terazosin. Last dose of terazosin on Friday.  Blood pressure list reveals sub optimal blood pressures, however states he takes his pressure within 5 minutes of taking medication, with evening blood pressures running on the higher side than evening.  Advised patient to fill terazosin today and take as recommended, 20 mg in the am, start taking amlodipine with his pm medications and start waiting 1 hour post medication consumption prior to taking blood pressures.  Advised him to keep a consistent list and I will call him on Friday to follow up.  If blood pressures remain high, will bring him in for a follow up visit, where he will be required to bring all medication bottles.  Verbalized understanding.

## 2011-09-26 ENCOUNTER — Ambulatory Visit (INDEPENDENT_AMBULATORY_CARE_PROVIDER_SITE_OTHER): Payer: BC Managed Care – PPO | Admitting: Cardiology

## 2011-09-26 ENCOUNTER — Encounter: Payer: Self-pay | Admitting: Cardiology

## 2011-09-26 VITALS — BP 168/100 | HR 88 | Ht 75.0 in | Wt 244.1 lb

## 2011-09-26 DIAGNOSIS — E785 Hyperlipidemia, unspecified: Secondary | ICD-10-CM

## 2011-09-26 DIAGNOSIS — E1065 Type 1 diabetes mellitus with hyperglycemia: Secondary | ICD-10-CM

## 2011-09-26 DIAGNOSIS — I10 Essential (primary) hypertension: Secondary | ICD-10-CM

## 2011-09-26 DIAGNOSIS — R079 Chest pain, unspecified: Secondary | ICD-10-CM

## 2011-09-26 DIAGNOSIS — IMO0002 Reserved for concepts with insufficient information to code with codable children: Secondary | ICD-10-CM

## 2011-09-26 DIAGNOSIS — N182 Chronic kidney disease, stage 2 (mild): Secondary | ICD-10-CM

## 2011-09-26 MED ORDER — VERAPAMIL HCL ER 120 MG PO TBCR: 120.0000 mg | EXTENDED_RELEASE_TABLET | Freq: Every day | ORAL | Status: DC

## 2011-09-26 MED ORDER — CARVEDILOL 25 MG PO TABS: 25.0000 mg | ORAL_TABLET | Freq: Two times a day (BID) | ORAL | Status: DC

## 2011-09-26 MED ORDER — CLONIDINE HCL 0.2 MG PO TABS: 0.2000 mg | ORAL_TABLET | Freq: Every evening | ORAL | Status: DC

## 2011-09-26 NOTE — Patient Instructions (Addendum)
Your physician recommends that you schedule a follow-up appointment in: 1 month  Your physician has recommended you make the following change in your medication:  1 - Take clonidine 0.2 mg in evening ONLY 2 - START Verapamil 120 mg daily 3 - START Coreg (Carvedilol) 25 mg twice a day after you take your last dose of Metoprolol

## 2011-09-26 NOTE — Assessment & Plan Note (Signed)
Patient denies any recent recurrent chest discomfort.

## 2011-09-26 NOTE — Assessment & Plan Note (Signed)
Patient's diabetologist, Dr. [name], has been working closely with him to achieve improved diabetic control.  FBS in 07/2011 was 122 with a fructosamine of 238, which is "within the normal range".

## 2011-09-26 NOTE — Assessment & Plan Note (Signed)
Blood pressure control remains suboptimal.  He may require treatment with minoxidil, but I am reluctant to start that medication due to its propensity to exacerbate his chronic diastolic congestive heart failure.  Verapamil will be added at a low dose, clonidine changed to once a day dosing in the evening and carvedilol substituted for metoprolol.  Patient will monitor blood pressure at home closely and return in one month for reassessment.

## 2011-09-26 NOTE — Progress Notes (Deleted)
Name: Lance [name]    DOB: [birth year]  Age: 41 y.o.  MR#: [id number]       PCP:  [name], MD      Insurance: @PAYORNAME @   CC:   No chief complaint on file.   VS BP 168/100  Pulse 88  Ht 6\' 3"  (1.905 m)  Wt 244 lb 1.9 oz (110.732 kg)  BMI 30.51 kg/m2  Weights Current Weight  09/26/11 244 lb 1.9 oz (110.732 kg)  07/01/11 234 lb (106.142 kg)  05/27/11 241 lb (109.317 kg)    Blood Pressure  BP Readings from Last 3 Encounters:  09/26/11 168/100  07/01/11 203/110  05/27/11 202/110     Admit date:  (Not on file) Last encounter with RMR:  Visit date not found   Allergy No Known Allergies  Current Outpatient Prescriptions  Medication Sig Dispense Refill  . amLODipine (NORVASC) 10 MG tablet Take 5 mg by mouth daily. 1/2 10 mg tab       . aspirin EC 81 MG EC tablet Take 1 tablet (81 mg total) by mouth daily.  30 tablet  11  . cloNIDine (CATAPRES) 0.2 MG tablet Take 1 tablet (0.2 mg total) by mouth 2 (two) times daily.  60 tablet  12  . insulin lispro protamine-insulin lispro (HUMALOG 75/25) (75-25) 100 UNIT/ML SUSP Inject 58-80 Units into the skin daily. 80 -150  58 units 150-300  68 units 300-400  80 units       . insulin NPH-insulin regular (NOVOLIN 50/50) (50-50) 100 UNIT/ML injection Inject 30 Units into the skin daily before supper.        . losartan-hydrochlorothiazide (HYZAAR) 100-25 MG per tablet Take 1 tablet by mouth daily.  30 tablet  12  . metoprolol succinate (TOPROL-XL) 100 MG 24 hr tablet Take 100 mg by mouth daily. Take with or immediately following a meal.      . rosuvastatin (CRESTOR) 40 MG tablet Take 40 mg by mouth daily.        Marland Kitchen terazosin (HYTRIN) 10 MG capsule Take 10 mg by mouth 2 (two) times daily.       Marland Kitchen DISCONTD: metoprolol (TOPROL XL) 100 MG 24 hr tablet Take 0.5 tablets (50 mg total) by mouth daily.  45 tablet  1  . DISCONTD: terazosin (HYTRIN) 5 MG capsule Take 1 capsule (5 mg total) by mouth at bedtime.  30 capsule  3    Discontinued  Meds:    Medications Discontinued During This Encounter  Medication Reason  . labetalol (NORMODYNE) 200 MG tablet Error  . verapamil (VERELAN PM) 120 MG 24 hr capsule Error    Patient Active Problem List  Diagnosis  . Chest pain  . Diabetes mellitus type 1, uncontrolled  . Hypoglycemia  . Hypertension, malignant  . Hyperlipidemia  . Diastolic CHF, chronic    LABS No visits with results within 3 Month(s) from this visit. Latest known visit with results is:  Admission on 09/26/2010, Discharged on 09/27/2010  Component Date Value  . Glucose-Capillary 09/26/2010 23*  . Comment 1 09/26/2010 Repeat Test   . Glucose-Capillary 09/26/2010 31*  . Comment 1 09/26/2010 Call MD NNP PA CNM   . WBC 09/26/2010 9.0   . RBC 09/26/2010 4.93   . Hemoglobin 09/26/2010 15.0   . HCT 09/26/2010 42.6   . MCV 09/26/2010 86.4   . [facility] 09/26/2010 30.4   . MCHC 09/26/2010 35.2   . RDW 09/26/2010 13.6   . Platelets 09/26/2010 238   .  Neutrophils Relative 09/26/2010 49   . Neutro Abs 09/26/2010 4.4   . Lymphocytes Relative 09/26/2010 40   . Lymphs Abs 09/26/2010 3.6   . Monocytes Relative 09/26/2010 8   . Monocytes Absolute 09/26/2010 0.7   . Eosinophils Relative 09/26/2010 2   . Eosinophils Absolute 09/26/2010 0.2   . Basophils Relative 09/26/2010 1   . Basophils Absolute 09/26/2010 0.1   . Troponin I 09/26/2010 <0.30   . Sodium 09/26/2010 139   . Potassium 09/26/2010 3.4*  . Chloride 09/26/2010 105   . CO2 09/26/2010 23   . Glucose, Bld 09/26/2010 26*  . BUN 09/26/2010 25*  . Creatinine, Ser 09/26/2010 1.34   . Calcium 09/26/2010 10.1   . GFR calc non Af Amer 09/26/2010 59*  . GFR calc Af Amer 09/26/2010 >60   . Glucose-Capillary 09/26/2010 140*  . Comment 1 09/26/2010 Documented in Chart   . Hemoglobin A1C 09/26/2010 11.2*  . Mean Plasma Glucose 09/26/2010 275*  . Total CK 09/26/2010 288*  . CK, MB 09/26/2010 6.9*  . Troponin I 09/26/2010 <0.30   . Relative Index 09/26/2010 2.4     . Glucose-Capillary 09/26/2010 123*  . Total CK 09/26/2010 237*  . CK, MB 09/26/2010 5.9*  . Troponin I 09/26/2010 <0.30   . Relative Index 09/26/2010 2.5   . Glucose-Capillary 09/26/2010 338*  . Total CK 09/26/2010 213   . CK, MB 09/26/2010 5.2*  . Troponin I 09/26/2010 <0.30   . Relative Index 09/26/2010 2.4   . Sodium 09/27/2010 133*  . Potassium 09/27/2010 4.3   . Chloride 09/27/2010 102   . CO2 09/27/2010 18*  . Glucose, Bld 09/27/2010 407*  . BUN 09/27/2010 27*  . Creatinine, Ser 09/27/2010 1.19   . Calcium 09/27/2010 9.1   . GFR calc non Af Amer 09/27/2010 >60   . GFR calc Af Amer 09/27/2010 >60   . WBC 09/27/2010 6.8   . RBC 09/27/2010 4.41   . Hemoglobin 09/27/2010 13.3   . HCT 09/27/2010 38.5*  . MCV 09/27/2010 87.3   . [facility] 09/27/2010 30.2   . MCHC 09/27/2010 34.5   . RDW 09/27/2010 13.6   . Platelets 09/27/2010 194   . Cholesterol 09/27/2010 322*  . Triglycerides 09/27/2010 329*  . HDL 09/27/2010 74   . Total CHOL/HDL Ratio 09/27/2010 4.4   . VLDL 09/27/2010 66*  . LDL Cholesterol 09/27/2010 182*  . Glucose-Capillary 09/26/2010 178*  . Glucose-Capillary 09/27/2010 408*  . Glucose, Bld 09/27/2010 391*  . Glucose-Capillary 09/27/2010 280*     Results for this Opt Visit:     Results for orders placed during the hospital encounter of 09/26/10  GLUCOSE, CAPILLARY      Component Value Range   Glucose-Capillary 23 (*) 70 - 99 mg/dL   Comment 1 Repeat Test    GLUCOSE, CAPILLARY      Component Value Range   Glucose-Capillary 31 (*) 70 - 99 mg/dL   Comment 1 Call MD NNP PA CNM    CBC      Component Value Range   WBC 9.0  4.0 - 10.5 K/uL   RBC 4.93  4.22 - 5.81 MIL/uL   Hemoglobin 15.0  13.0 - 17.0 g/dL   HCT 42.6  39.0 - 52.0 %   MCV 86.4  78.0 - 100.0 fL   MCH 30.4  26.0 - 34.0 pg   MCHC 35.2  30.0 - 36.0 g/dL   RDW 13.6  11.5 - 15.5 %   Platelets 238  150 - 400 K/uL  DIFFERENTIAL      Component Value Range   Neutrophils Relative 49  43 - 77 %    Neutro Abs 4.4  1.7 - 7.7 K/uL   Lymphocytes Relative 40  12 - 46 %   Lymphs Abs 3.6  0.7 - 4.0 K/uL   Monocytes Relative 8  3 - 12 %   Monocytes Absolute 0.7  0.1 - 1.0 K/uL   Eosinophils Relative 2  0 - 5 %   Eosinophils Absolute 0.2  0.0 - 0.7 K/uL   Basophils Relative 1  0 - 1 %   Basophils Absolute 0.1  0.0 - 0.1 K/uL  TROPONIN I      Component Value Range   Troponin I <0.30  <0.30 ng/mL  BASIC METABOLIC PANEL      Component Value Range   Sodium 139  135 - 145 mEq/L   Potassium 3.4 (*) 3.5 - 5.1 mEq/L   Chloride 105  96 - 112 mEq/L   CO2 23  19 - 32 mEq/L   Glucose, Bld 26 (*) 70 - 99 mg/dL   BUN 25 (*) 6 - 23 mg/dL   Creatinine, Ser 1.34  0.50 - 1.35 mg/dL   Calcium 10.1  8.4 - 10.5 mg/dL   GFR calc non Af Amer 59 (*) >60 mL/min   GFR calc Af Amer >60  >60 mL/min  GLUCOSE, CAPILLARY      Component Value Range   Glucose-Capillary 140 (*) 70 - 99 mg/dL   Comment 1 Documented in Chart    HEMOGLOBIN A1C      Component Value Range   Hemoglobin A1C 11.2 (*) <5.7 %   Mean Plasma Glucose 275 (*) <117 mg/dL  CARDIAC PANEL(CRET KIN+CKTOT+MB+TROPI)      Component Value Range   Total CK 288 (*) 7 - 232 U/L   CK, MB 6.9 (*) 0.3 - 4.0 ng/mL   Troponin I <0.30  <0.30 ng/mL   Relative Index 2.4  0.0 - 2.5  GLUCOSE, CAPILLARY      Component Value Range   Glucose-Capillary 123 (*) 70 - 99 mg/dL  CARDIAC PANEL(CRET KIN+CKTOT+MB+TROPI)      Component Value Range   Total CK 237 (*) 7 - 232 U/L   CK, MB 5.9 (*) 0.3 - 4.0 ng/mL   Troponin I <0.30  <0.30 ng/mL   Relative Index 2.5  0.0 - 2.5  GLUCOSE, CAPILLARY      Component Value Range   Glucose-Capillary 338 (*) 70 - 99 mg/dL  CARDIAC PANEL(CRET KIN+CKTOT+MB+TROPI)      Component Value Range   Total CK 213  7 - 232 U/L   CK, MB 5.2 (*) 0.3 - 4.0 ng/mL   Troponin I <0.30  <0.30 ng/mL   Relative Index 2.4  0.0 - 2.5  BASIC METABOLIC PANEL      Component Value Range   Sodium 133 (*) 135 - 145 mEq/L   Potassium 4.3  3.5 - 5.1  mEq/L   Chloride 102  96 - 112 mEq/L   CO2 18 (*) 19 - 32 mEq/L   Glucose, Bld 407 (*) 70 - 99 mg/dL   BUN 27 (*) 6 - 23 mg/dL   Creatinine, Ser 1.19  0.50 - 1.35 mg/dL   Calcium 9.1  8.4 - 10.5 mg/dL   GFR calc non Af Amer >60  >60 mL/min   GFR calc Af Amer >60  >60 mL/min  CBC  Component Value Range   WBC 6.8  4.0 - 10.5 K/uL   RBC 4.41  4.22 - 5.81 MIL/uL   Hemoglobin 13.3  13.0 - 17.0 g/dL   HCT 38.5 (*) 39.0 - 52.0 %   MCV 87.3  78.0 - 100.0 fL   MCH 30.2  26.0 - 34.0 pg   MCHC 34.5  30.0 - 36.0 g/dL   RDW 13.6  11.5 - 15.5 %   Platelets 194  150 - 400 K/uL  LIPID PANEL      Component Value Range   Cholesterol 322 (*) 0 - 200 mg/dL   Triglycerides 329 (*) <150 mg/dL   HDL 74  >39 mg/dL   Total CHOL/HDL Ratio 4.4     VLDL 66 (*) 0 - 40 mg/dL   LDL Cholesterol 182 (*) 0 - 99 mg/dL  GLUCOSE, CAPILLARY      Component Value Range   Glucose-Capillary 178 (*) 70 - 99 mg/dL  GLUCOSE, CAPILLARY      Component Value Range   Glucose-Capillary 408 (*) 70 - 99 mg/dL  GLUCOSE, RANDOM      Component Value Range   Glucose, Bld 391 (*) 70 - 99 mg/dL  GLUCOSE, CAPILLARY      Component Value Range   Glucose-Capillary 280 (*) 70 - 99 mg/dL    EKG Orders placed in visit on 10/08/10  . EXERCISE TOLERANCE TEST  . EXERCISE TOLERANCE TEST     Prior Assessment and Plan Problem List as of 09/26/2011            Cardiology Problems   Hypertension, malignant   Last Assessment & Plan Note   03/18/2011 Office Visit Addendum 03/23/2011  9:11 AM by Yehuda Savannah, MD    Pedal edema has developed and is likely related to treatment with amlodipine.  Dosage of that medicine will be decreased to 5 mg per day and verapamil 120 mg per day added.  The price of valsartan is outrageous.  Losartan/HCT will be substituted at a dose of 100/25 mg.  A chemistry profile will be obtained in one month.  Patient reports fatigue with metoprolol.  Labetalol will be substituted at a dose of 400 mg b.i.d.   His dose of terazosin will be increased to 10 mg twice a day.  He will continue to monitor home blood pressure, return for reassessment by the cardiology nurses in 3 months and by me in 6 months.    Hyperlipidemia   Last Assessment & Plan Note   03/18/2011 Office Visit Signed 03/18/2011  4:28 PM by Yehuda Savannah, MD    Fasting lipid profile will be reassessed on maximal dose of rosuvastatin.    Diastolic CHF, chronic   Last Assessment & Plan Note   03/18/2011 Office Visit Signed 03/18/2011  2:00 PM by Yehuda Savannah, MD    CHF has been compensated with better control of hypertension and a modest dose of diuretic.      Other   Chest pain   Last Assessment & Plan Note   03/18/2011 Office Visit Signed 03/18/2011  1:59 PM by Yehuda Savannah, MD    No symptoms to suggest myocardial ischemia since hospital discharge some months ago.    Diabetes mellitus type 1, uncontrolled   Last Assessment & Plan Note   01/15/2011 Office Visit Signed 01/15/2011 11:31 AM by Yehuda Savannah, MD    Diabetes is managed by Dr. Wolfgang Phoenix with progressive improvement in control.    Hypoglycemia  Imaging: No results found.   FRS Calculation: Score not calculated. Missing: Total Cholesterol

## 2011-09-26 NOTE — Progress Notes (Signed)
Patient ID: Lance [name], male   DOB: [birth year], 41 y.o.   MRN: [id number]  HPI: Scheduled return visit for this very nice young gentleman with severe hypertension.  With current therapy, blood pressure values determined at home have intermittently approached normality; however, he is experiencing excessive daytime somnolence, presumably related to treatment with clonidine.    Prior to Admission medications   Medication Sig Start Date End Date Taking? Authorizing Provider  amLODipine (NORVASC) 10 MG tablet Take 5 mg by mouth daily. 1/2 10 mg tab    Yes Historical Provider, MD  aspirin EC 81 MG EC tablet Take 1 tablet (81 mg total) by mouth daily. 09/27/10 09/27/11 Yes Estela Leonie Green, MD  cloNIDine (CATAPRES) 0.2 MG tablet Take 1 tablet (0.2 mg total) by mouth 2 (two) times daily. 11/26/10 11/26/11 Yes Yehuda Savannah, MD  insulin lispro protamine-insulin lispro (HUMALOG 75/25) (75-25) 100 UNIT/ML SUSP Inject 58-80 Units into the skin daily. 80 -150  58 units 150-300  68 units 300-400  80 units    Yes Historical Provider, MD  insulin NPH-insulin regular (NOVOLIN 50/50) (50-50) 100 UNIT/ML injection Inject 30 Units into the skin daily before supper.     Yes Historical Provider, MD  losartan-hydrochlorothiazide (HYZAAR) 100-25 MG per tablet Take 1 tablet by mouth daily. 03/18/11 03/17/12 Yes Yehuda Savannah, MD  rosuvastatin (CRESTOR) 40 MG tablet Take 40 mg by mouth daily.     Yes Historical Provider, MD  terazosin (HYTRIN) 10 MG capsule Take 10 mg by mouth 2 (two) times daily.  03/18/11 03/17/12 Yes Yehuda Savannah, MD   No Known Allergies    Past medical history, social history, and family history reviewed and updated.  ROS: Denies diaphoresis or headaches.  All other systems reviewed and are negative.  He has had recent weight gain which may be attributable to treatment with insulin.  He reports a tendency towards fluid retention, and has a history of diastolic congestive heart  failure.  PHYSICAL EXAM: BP 168/100  Pulse 88  Ht 6\' 3"  (1.905 m)  Wt 110.732 kg (244 lb 1.9 oz)  BMI 30.51 kg/m2  General-Well developed; no acute distress Body habitus-Moderately overweight Neck-No JVD; no carotid bruits Lungs-clear lung fields; resonant to percussion Cardiovascular-normal PMI; normal S1 and S2 Abdomen-normal bowel sounds; soft and non-tender without masses or organomegaly Musculoskeletal-No deformities, no cyanosis or clubbing Neurologic-Normal cranial nerves; symmetric strength and tone Skin-Warm, no significant lesions Extremities-distal pulses intact; no edema  ASSESSMENT AND PLAN:  [name], MD 09/26/2011 1:36 PM

## 2011-09-26 NOTE — Assessment & Plan Note (Addendum)
Patient developed adverse effects of high-dose rosuvastatin.  Dosage has been reduced to 10 mg 3 days per week by Dr. [name], but I doubt this will be effective in managing patients fairly severe hyperlipidemia.  A repeat lipid profile will be obtained.

## 2011-10-02 ENCOUNTER — Other Ambulatory Visit: Payer: Self-pay | Admitting: *Deleted

## 2011-10-02 ENCOUNTER — Telehealth: Payer: Self-pay | Admitting: *Deleted

## 2011-10-02 ENCOUNTER — Encounter: Payer: Self-pay | Admitting: *Deleted

## 2011-10-02 DIAGNOSIS — I10 Essential (primary) hypertension: Secondary | ICD-10-CM

## 2011-10-02 NOTE — Telephone Encounter (Signed)
lab needed      Lance Savannah, MD      Sent: Sun September 29, 2011 11:05 PM    To: Lance Age, RN     Lance [name]    MRN: [id number] DOB: 02/20/1970    Pt Work: [phone] Pt Home: [phone]    Message     Lab from PCP did not include a lipid profile, which will need to be drawn.  Also obtain BMet, CBC, A1c and TSH.     Letter sent to patient indicating addition of lab work to recommendations

## 2011-10-07 ENCOUNTER — Other Ambulatory Visit: Payer: Self-pay | Admitting: *Deleted

## 2011-10-07 DIAGNOSIS — I10 Essential (primary) hypertension: Secondary | ICD-10-CM

## 2011-10-25 ENCOUNTER — Encounter: Payer: Self-pay | Admitting: *Deleted

## 2011-10-25 ENCOUNTER — Other Ambulatory Visit: Payer: Self-pay | Admitting: *Deleted

## 2011-10-25 DIAGNOSIS — I10 Essential (primary) hypertension: Secondary | ICD-10-CM

## 2011-10-25 DIAGNOSIS — N182 Chronic kidney disease, stage 2 (mild): Secondary | ICD-10-CM

## 2011-10-25 DIAGNOSIS — I509 Heart failure, unspecified: Secondary | ICD-10-CM

## 2011-10-25 DIAGNOSIS — E139 Other specified diabetes mellitus without complications: Secondary | ICD-10-CM

## 2011-10-29 ENCOUNTER — Ambulatory Visit (INDEPENDENT_AMBULATORY_CARE_PROVIDER_SITE_OTHER): Payer: BC Managed Care – PPO | Admitting: Cardiology

## 2011-10-29 ENCOUNTER — Encounter: Payer: Self-pay | Admitting: Cardiology

## 2011-10-29 VITALS — BP 208/112 | HR 100 | Ht 75.0 in | Wt 245.0 lb

## 2011-10-29 DIAGNOSIS — I10 Essential (primary) hypertension: Secondary | ICD-10-CM

## 2011-10-29 MED ORDER — VERAPAMIL HCL ER 240 MG PO TBCR: 240.0000 mg | EXTENDED_RELEASE_TABLET | Freq: Every day | ORAL | Status: AC

## 2011-10-29 MED ORDER — CARVEDILOL 25 MG PO TABS: 50.0000 mg | ORAL_TABLET | Freq: Two times a day (BID) | ORAL | Status: DC

## 2011-10-29 NOTE — Assessment & Plan Note (Signed)
Blood pressure is about as good as it has been, but still suboptimally controlled.  I doubt that his current medical regime will be entirely effective, but will continue to titrate dosages upward.  Verapamil will be increased to 240 and then 360 mg per day and carvedilol to 50 mg twice a day.  Patient will continue to monitor heart rate and blood pressure.  RN visit in one month to include a rhythm strip.

## 2011-10-29 NOTE — Patient Instructions (Signed)
Your physician recommends that you schedule a follow-up appointment in:  1 - 3 weeks with nurse 2 - 6 weeks with provider  Your physician has recommended you make the following change in your medication:  1 - INCREASE Coreg (Carvedilol) to 50 mg twice daily 2 - INCREASE Verapamil to 240 mg daily when current bottle is empty  Your physician has requested that you regularly monitor and record your blood pressure readings at home. Please use the same machine at the same time of day to check your readings and record them to bring to your follow-up visit.

## 2011-10-29 NOTE — Progress Notes (Deleted)
Name: Lance [name]    DOB: 11-Jun-1970  Age: 41 y.o.  MR#: [id number]       PCP:  [name], MD      Insurance: @PAYORNAME @   CC:    Chief Complaint  Patient presents with  . Hypertension    VS BP 208/112  Pulse 100  Ht 6\' 3"  (1.905 m)  Wt 245 lb (111.131 kg)  BMI 30.62 kg/m2  Weights Current Weight  10/29/11 245 lb (111.131 kg)  09/26/11 244 lb 1.9 oz (110.732 kg)  07/01/11 234 lb (106.142 kg)    Blood Pressure  BP Readings from Last 3 Encounters:  10/29/11 208/112  09/26/11 168/100  07/01/11 203/110     Admit date:  (Not on file) Last encounter with RMR:  09/26/2011   Allergy No Known Allergies  Current Outpatient Prescriptions  Medication Sig Dispense Refill  . aspirin EC 81 MG tablet Take 81 mg by mouth daily.      . carvedilol (COREG) 25 MG tablet Take 1 tablet (25 mg total) by mouth 2 (two) times daily.  60 tablet  11  . cloNIDine (CATAPRES) 0.2 MG tablet Take 1 tablet (0.2 mg total) by mouth every evening.  30 tablet  11  . insulin lispro protamine-insulin lispro (HUMALOG 75/25) (75-25) 100 UNIT/ML SUSP Inject 58-80 Units into the skin daily. 80 -150  58 units 150-300  68 units 300-400  80 units       . insulin NPH-insulin regular (NOVOLIN 50/50) (50-50) 100 UNIT/ML injection Inject 30 Units into the skin daily before supper.        . losartan-hydrochlorothiazide (HYZAAR) 100-25 MG per tablet Take 1 tablet by mouth daily.  30 tablet  12  . rosuvastatin (CRESTOR) 40 MG tablet Take 40 mg by mouth daily.        Marland Kitchen terazosin (HYTRIN) 10 MG capsule Take 10 mg by mouth 2 (two) times daily.       . verapamil (CALAN-SR) 120 MG CR tablet Take 1 tablet (120 mg total) by mouth at bedtime.  20 tablet  11  . DISCONTD: terazosin (HYTRIN) 5 MG capsule Take 1 capsule (5 mg total) by mouth at bedtime.  30 capsule  3    Discontinued Meds:    Medications Discontinued During This Encounter  Medication Reason  . amLODipine (NORVASC) 10 MG tablet Patient has not taken in  last 30 days    Patient Active Problem List  Diagnosis  . Chest pain  . Diabetes mellitus type 1  . Hypoglycemia  . Hypertension, malignant  . Hyperlipidemia  . Diastolic CHF, chronic  . Chronic kidney disease, stage II (mild)    LABS No visits with results within 3 Month(s) from this visit. Latest known visit with results is:  Admission on 09/26/2010, Discharged on 09/27/2010  Component Date Value  . Glucose-Capillary 09/26/2010 23*  . Comment 1 09/26/2010 Repeat Test   . Glucose-Capillary 09/26/2010 31*  . Comment 1 09/26/2010 Call MD NNP PA CNM   . WBC 09/26/2010 9.0   . RBC 09/26/2010 4.93   . Hemoglobin 09/26/2010 15.0   . HCT 09/26/2010 42.6   . MCV 09/26/2010 86.4   . [facility] 09/26/2010 30.4   . MCHC 09/26/2010 35.2   . RDW 09/26/2010 13.6   . Platelets 09/26/2010 238   . Neutrophils Relative 09/26/2010 49   . Neutro Abs 09/26/2010 4.4   . Lymphocytes Relative 09/26/2010 40   . Lymphs Abs 09/26/2010 3.6   . Monocytes  Relative 09/26/2010 8   . Monocytes Absolute 09/26/2010 0.7   . Eosinophils Relative 09/26/2010 2   . Eosinophils Absolute 09/26/2010 0.2   . Basophils Relative 09/26/2010 1   . Basophils Absolute 09/26/2010 0.1   . Troponin I 09/26/2010 <0.30   . Sodium 09/26/2010 139   . Potassium 09/26/2010 3.4*  . Chloride 09/26/2010 105   . CO2 09/26/2010 23   . Glucose, Bld 09/26/2010 26*  . BUN 09/26/2010 25*  . Creatinine, Ser 09/26/2010 1.34   . Calcium 09/26/2010 10.1   . GFR calc non Af Amer 09/26/2010 59*  . GFR calc Af Amer 09/26/2010 >60   . Glucose-Capillary 09/26/2010 140*  . Comment 1 09/26/2010 Documented in Chart   . Hemoglobin A1C 09/26/2010 11.2*  . Mean Plasma Glucose 09/26/2010 275*  . Total CK 09/26/2010 288*  . CK, MB 09/26/2010 6.9*  . Troponin I 09/26/2010 <0.30   . Relative Index 09/26/2010 2.4   . Glucose-Capillary 09/26/2010 123*  . Total CK 09/26/2010 237*  . CK, MB 09/26/2010 5.9*  . Troponin I 09/26/2010 <0.30   .  Relative Index 09/26/2010 2.5   . Glucose-Capillary 09/26/2010 338*  . Total CK 09/26/2010 213   . CK, MB 09/26/2010 5.2*  . Troponin I 09/26/2010 <0.30   . Relative Index 09/26/2010 2.4   . Sodium 09/27/2010 133*  . Potassium 09/27/2010 4.3   . Chloride 09/27/2010 102   . CO2 09/27/2010 18*  . Glucose, Bld 09/27/2010 407*  . BUN 09/27/2010 27*  . Creatinine, Ser 09/27/2010 1.19   . Calcium 09/27/2010 9.1   . GFR calc non Af Amer 09/27/2010 >60   . GFR calc Af Amer 09/27/2010 >60   . WBC 09/27/2010 6.8   . RBC 09/27/2010 4.41   . Hemoglobin 09/27/2010 13.3   . HCT 09/27/2010 38.5*  . MCV 09/27/2010 87.3   . [facility] 09/27/2010 30.2   . MCHC 09/27/2010 34.5   . RDW 09/27/2010 13.6   . Platelets 09/27/2010 194   . Cholesterol 09/27/2010 322*  . Triglycerides 09/27/2010 329*  . HDL 09/27/2010 74   . Total CHOL/HDL Ratio 09/27/2010 4.4   . VLDL 09/27/2010 66*  . LDL Cholesterol 09/27/2010 182*  . Glucose-Capillary 09/26/2010 178*  . Glucose-Capillary 09/27/2010 408*  . Glucose, Bld 09/27/2010 391*  . Glucose-Capillary 09/27/2010 280*     Results for this Opt Visit:     Results for orders placed during the hospital encounter of 09/26/10  GLUCOSE, CAPILLARY      Component Value Range   Glucose-Capillary 23 (*) 70 - 99 mg/dL   Comment 1 Repeat Test    GLUCOSE, CAPILLARY      Component Value Range   Glucose-Capillary 31 (*) 70 - 99 mg/dL   Comment 1 Call MD NNP PA CNM    CBC      Component Value Range   WBC 9.0  4.0 - 10.5 K/uL   RBC 4.93  4.22 - 5.81 MIL/uL   Hemoglobin 15.0  13.0 - 17.0 g/dL   HCT 42.6  39.0 - 52.0 %   MCV 86.4  78.0 - 100.0 fL   MCH 30.4  26.0 - 34.0 pg   MCHC 35.2  30.0 - 36.0 g/dL   RDW 13.6  11.5 - 15.5 %   Platelets 238  150 - 400 K/uL  DIFFERENTIAL      Component Value Range   Neutrophils Relative 49  43 - 77 %   Neutro Abs 4.4  1.7 - 7.7 K/uL   Lymphocytes Relative 40  12 - 46 %   Lymphs Abs 3.6  0.7 - 4.0 K/uL   Monocytes Relative 8  3 -  12 %   Monocytes Absolute 0.7  0.1 - 1.0 K/uL   Eosinophils Relative 2  0 - 5 %   Eosinophils Absolute 0.2  0.0 - 0.7 K/uL   Basophils Relative 1  0 - 1 %   Basophils Absolute 0.1  0.0 - 0.1 K/uL  TROPONIN I      Component Value Range   Troponin I <0.30  <0.30 ng/mL  BASIC METABOLIC PANEL      Component Value Range   Sodium 139  135 - 145 mEq/L   Potassium 3.4 (*) 3.5 - 5.1 mEq/L   Chloride 105  96 - 112 mEq/L   CO2 23  19 - 32 mEq/L   Glucose, Bld 26 (*) 70 - 99 mg/dL   BUN 25 (*) 6 - 23 mg/dL   Creatinine, Ser 1.34  0.50 - 1.35 mg/dL   Calcium 10.1  8.4 - 10.5 mg/dL   GFR calc non Af Amer 59 (*) >60 mL/min   GFR calc Af Amer >60  >60 mL/min  GLUCOSE, CAPILLARY      Component Value Range   Glucose-Capillary 140 (*) 70 - 99 mg/dL   Comment 1 Documented in Chart    HEMOGLOBIN A1C      Component Value Range   Hemoglobin A1C 11.2 (*) <5.7 %   Mean Plasma Glucose 275 (*) <117 mg/dL  CARDIAC PANEL(CRET KIN+CKTOT+MB+TROPI)      Component Value Range   Total CK 288 (*) 7 - 232 U/L   CK, MB 6.9 (*) 0.3 - 4.0 ng/mL   Troponin I <0.30  <0.30 ng/mL   Relative Index 2.4  0.0 - 2.5  GLUCOSE, CAPILLARY      Component Value Range   Glucose-Capillary 123 (*) 70 - 99 mg/dL  CARDIAC PANEL(CRET KIN+CKTOT+MB+TROPI)      Component Value Range   Total CK 237 (*) 7 - 232 U/L   CK, MB 5.9 (*) 0.3 - 4.0 ng/mL   Troponin I <0.30  <0.30 ng/mL   Relative Index 2.5  0.0 - 2.5  GLUCOSE, CAPILLARY      Component Value Range   Glucose-Capillary 338 (*) 70 - 99 mg/dL  CARDIAC PANEL(CRET KIN+CKTOT+MB+TROPI)      Component Value Range   Total CK 213  7 - 232 U/L   CK, MB 5.2 (*) 0.3 - 4.0 ng/mL   Troponin I <0.30  <0.30 ng/mL   Relative Index 2.4  0.0 - 2.5  BASIC METABOLIC PANEL      Component Value Range   Sodium 133 (*) 135 - 145 mEq/L   Potassium 4.3  3.5 - 5.1 mEq/L   Chloride 102  96 - 112 mEq/L   CO2 18 (*) 19 - 32 mEq/L   Glucose, Bld 407 (*) 70 - 99 mg/dL   BUN 27 (*) 6 - 23 mg/dL     Creatinine, Ser 1.19  0.50 - 1.35 mg/dL   Calcium 9.1  8.4 - 10.5 mg/dL   GFR calc non Af Amer >60  >60 mL/min   GFR calc Af Amer >60  >60 mL/min  CBC      Component Value Range   WBC 6.8  4.0 - 10.5 K/uL   RBC 4.41  4.22 - 5.81 MIL/uL   Hemoglobin 13.3  13.0 - 17.0  g/dL   HCT 38.5 (*) 39.0 - 52.0 %   MCV 87.3  78.0 - 100.0 fL   MCH 30.2  26.0 - 34.0 pg   MCHC 34.5  30.0 - 36.0 g/dL   RDW 13.6  11.5 - 15.5 %   Platelets 194  150 - 400 K/uL  LIPID PANEL      Component Value Range   Cholesterol 322 (*) 0 - 200 mg/dL   Triglycerides 329 (*) <150 mg/dL   HDL 74  >39 mg/dL   Total CHOL/HDL Ratio 4.4     VLDL 66 (*) 0 - 40 mg/dL   LDL Cholesterol 182 (*) 0 - 99 mg/dL  GLUCOSE, CAPILLARY      Component Value Range   Glucose-Capillary 178 (*) 70 - 99 mg/dL  GLUCOSE, CAPILLARY      Component Value Range   Glucose-Capillary 408 (*) 70 - 99 mg/dL  GLUCOSE, RANDOM      Component Value Range   Glucose, Bld 391 (*) 70 - 99 mg/dL  GLUCOSE, CAPILLARY      Component Value Range   Glucose-Capillary 280 (*) 70 - 99 mg/dL    EKG Orders placed in visit on 10/08/10  . EXERCISE TOLERANCE TEST  . EXERCISE TOLERANCE TEST     Prior Assessment and Plan Problem List as of 10/29/2011            Cardiology Problems   Hypertension, malignant   Last Assessment & Plan Note   09/26/2011 Office Visit Signed 09/26/2011  1:43 PM by Yehuda Savannah, MD    Blood pressure control remains suboptimal.  He may require treatment with minoxidil, but I am reluctant to start that medication due to its propensity to exacerbate his chronic diastolic congestive heart failure.  Verapamil will be added at a low dose, clonidine changed to once a day dosing in the evening and carvedilol substituted for metoprolol.  Patient will monitor blood pressure at home closely and return in one month for reassessment.    Hyperlipidemia   Last Assessment & Plan Note   09/26/2011 Office Visit Addendum 09/29/2011 11:03 PM by  Yehuda Savannah, MD    Patient developed adverse effects of high-dose rosuvastatin.  Dosage has been reduced to 10 mg 3 days per week by Dr. [name], but I doubt this will be effective in managing patients fairly severe hyperlipidemia.  A repeat lipid profile will be obtained.    Diastolic CHF, chronic   Last Assessment & Plan Note   03/18/2011 Office Visit Signed 03/18/2011  2:00 PM by Yehuda Savannah, MD    CHF has been compensated with better control of hypertension and a modest dose of diuretic.      Other   Chest pain   Last Assessment & Plan Note   09/26/2011 Office Visit Signed 09/26/2011  1:39 PM by Yehuda Savannah, MD    Patient denies any recent recurrent chest discomfort.    Diabetes mellitus type 1   Last Assessment & Plan Note   09/26/2011 Office Visit Signed 09/26/2011  1:40 PM by Yehuda Savannah, MD    Patient's diabetologist, Dr. [name], has been working closely with him to achieve improved diabetic control.  FBS in 07/2011 was 122 with a fructosamine of 238, which is "within the normal range".    Hypoglycemia   Chronic kidney disease, stage II (mild)       Imaging: No results found.   FRS Calculation: Score not calculated. Missing: Total Cholesterol

## 2011-10-29 NOTE — Progress Notes (Signed)
Patient ID: Lance [name], male   DOB: 05-20-1970, 41 y.o.   MRN: [id number]  HPI: Scheduled return visit for this nice young gentleman with very severe hypertension.  Since his last visit, he has monitored blood pressure with generally improved results; however, systolics are not infrequently between 160 and [id number], while diastolics rarely exceed [id number].  He remains asymptomatic and appears to be entirely compliant with his complex medical regime.  He reports no apparent adverse reactions related to his many medications.  Sometime ago, he experienced mild orthostatic lightheadedness, but that issue has resolved spontaneously.  Prior to Admission medications   Medication Sig Start Date End Date Taking? Authorizing Provider  aspirin EC 81 MG tablet Take 81 mg by mouth daily.   Yes Historical Provider, MD  carvedilol (COREG) 25 MG tablet Take 2 tablets (50 mg total) by mouth 2 (two) times daily. 10/29/11 10/28/12 Yes Yehuda Savannah, MD  cloNIDine (CATAPRES) 0.2 MG tablet Take 1 tablet (0.2 mg total) by mouth every evening. 09/26/11 09/25/12 Yes Yehuda Savannah, MD  insulin lispro protamine-insulin lispro (HUMALOG 75/25) (75-25) 100 UNIT/ML SUSP Inject 58-80 Units into the skin daily. 80 -150  58 units 150-300  68 units 300-400  80 units    Yes Historical Provider, MD  insulin NPH-insulin regular (NOVOLIN 50/50) (50-50) 100 UNIT/ML injection Inject 30 Units into the skin daily before supper.     Yes Historical Provider, MD  losartan-hydrochlorothiazide (HYZAAR) 100-25 MG per tablet Take 1 tablet by mouth daily. 03/18/11 03/17/12 Yes Yehuda Savannah, MD  rosuvastatin (CRESTOR) 40 MG tablet Take 40 mg by mouth daily.     Yes Historical Provider, MD  terazosin (HYTRIN) 10 MG capsule Take 10 mg by mouth 2 (two) times daily.  03/18/11 03/17/12 Yes Yehuda Savannah, MD  verapamil (CALAN-SR) 240 MG CR tablet Take 1 tablet (240 mg total) by mouth at bedtime. 10/29/11 10/28/12 Yes Yehuda Savannah, MD   No Known  Allergies    Past medical history, social history, and family history reviewed and updated.  ROS: Denies dyspnea, orthopnea, PND, chest pain, lightheadedness or syncope.  All other systems reviewed and are negative.  PHYSICAL EXAM: BP 208/112  Pulse 100  Ht 6\' 3"  (1.905 m)  Wt 111.131 kg (245 lb)  BMI 30.62 kg/m2 ; repeat blood pressure 170/110 General-Well developed; no acute distress; robust-appearing Body habitus-mildly overweight Neck-No JVD; no carotid bruits Lungs-clear lung fields; resonant to percussion Cardiovascular-normal PMI; normal S1 and S2; S4 present Abdomen-normal bowel sounds; soft and non-tender without masses or organomegaly Musculoskeletal-No deformities, no cyanosis or clubbing Neurologic-Normal cranial nerves; symmetric strength and tone Skin-Warm, no significant lesions Extremities-distal pulses intact; no edema  ASSESSMENT AND PLAN:  [name], MD 10/29/2011 4:32 PM

## 2011-11-19 ENCOUNTER — Ambulatory Visit (INDEPENDENT_AMBULATORY_CARE_PROVIDER_SITE_OTHER): Payer: BC Managed Care – PPO | Admitting: *Deleted

## 2011-11-19 VITALS — BP 198/98 | HR 77 | Ht 75.0 in | Wt 244.0 lb

## 2011-11-19 DIAGNOSIS — I10 Essential (primary) hypertension: Secondary | ICD-10-CM

## 2011-11-19 NOTE — Progress Notes (Signed)
Patient presents with two sheets of blood pressures with adequate blood pressures one teens over 70's and sub optimal blood pressures in the evenings of 150 to 200 range.  States taking medications, as prescribed.  Advised him to start taking losartan at noon, instead of early am, to even out his coverage, continue to take pressures and report those to [place] on Friday the 11th.  I will call him with further recommendations from Dr Lattie Haw, once reviewed.  BP was 208/112 at LOV on 9/17, with HR of 100.

## 2011-11-25 NOTE — Progress Notes (Signed)
Patient ID: Lance [name], male   DOB: 09-Apr-1970, 41 y.o.   MRN: [id number]  Blood pressure control remains suboptimal. Minoxidil 2.5 mg every morning Furosemide 40 mg daily Bmet in 2 and 4 weeks Continue home blood pressure monitoring Treatment will be reassessed at upcoming office visit in 2 weeks.

## 2011-11-27 MED ORDER — FUROSEMIDE 40 MG PO TABS: 40.0000 mg | ORAL_TABLET | Freq: Every day | ORAL | Status: DC

## 2011-11-27 MED ORDER — MINOXIDIL 2.5 MG PO TABS: 2.5000 mg | ORAL_TABLET | Freq: Every day | ORAL | Status: DC

## 2011-11-27 NOTE — Progress Notes (Signed)
Attempted to contact patient regarding recommendations.  Message left for a return call.

## 2011-11-29 ENCOUNTER — Encounter: Payer: Self-pay | Admitting: *Deleted

## 2011-11-29 ENCOUNTER — Other Ambulatory Visit: Payer: Self-pay | Admitting: *Deleted

## 2011-11-29 DIAGNOSIS — I10 Essential (primary) hypertension: Secondary | ICD-10-CM

## 2011-11-29 NOTE — Progress Notes (Signed)
Patient made aware of recommendations and verbalizes understanding.

## 2011-12-04 ENCOUNTER — Ambulatory Visit: Payer: BC Managed Care – PPO | Admitting: Cardiology

## 2011-12-06 ENCOUNTER — Encounter: Payer: Self-pay | Admitting: Cardiology

## 2011-12-06 ENCOUNTER — Ambulatory Visit (INDEPENDENT_AMBULATORY_CARE_PROVIDER_SITE_OTHER): Payer: BC Managed Care – PPO | Admitting: Cardiology

## 2011-12-06 VITALS — BP 198/105 | HR 72 | Ht 75.0 in | Wt 244.0 lb

## 2011-12-06 DIAGNOSIS — R079 Chest pain, unspecified: Secondary | ICD-10-CM

## 2011-12-06 DIAGNOSIS — N182 Chronic kidney disease, stage 2 (mild): Secondary | ICD-10-CM

## 2011-12-06 DIAGNOSIS — I10 Essential (primary) hypertension: Secondary | ICD-10-CM

## 2011-12-06 DIAGNOSIS — E785 Hyperlipidemia, unspecified: Secondary | ICD-10-CM

## 2011-12-06 NOTE — Progress Notes (Signed)
Patient ID: Lance [name], male   DOB: 1970/04/29, 41 y.o.   MRN: [id number]  HPI: Scheduled return visit for this nice young gentleman with malignant hypertension.  Since his last visit, he has been generally well.  He purchased minoxidil, but has not yet initiated treatment with that medication.  Nonetheless, his blood pressure logs show marked improvement since his last visit.  Over the past 2 weeks, systolics have tip we then less than 150 with a maximum of 170 and 25% of measurements exceeding 150 mmHg.  Diastolics are all less than 90.  Prior to Admission medications   Medication Sig Start Date End Date Taking? Authorizing Provider  aspirin EC 81 MG tablet Take 81 mg by mouth daily.   Yes Historical Provider, MD  carvedilol (COREG) 25 MG tablet Take 2 tablets (50 mg total) by mouth 2 (two) times daily. 10/29/11 10/28/12 Yes Yehuda Savannah, MD  cloNIDine (CATAPRES) 0.2 MG tablet Take 1 tablet (0.2 mg total) by mouth every evening. 09/26/11 09/25/12 Yes Yehuda Savannah, MD  insulin lispro protamine-insulin lispro (HUMALOG 75/25) (75-25) 100 UNIT/ML SUSP Inject 58-80 Units into the skin daily. 80 -150  58 units 150-300  68 units 300-400  80 units    Yes Historical Provider, MD  insulin NPH-insulin regular (NOVOLIN 50/50) (50-50) 100 UNIT/ML injection Inject 30 Units into the skin daily before supper.     Yes Historical Provider, MD  losartan-hydrochlorothiazide (HYZAAR) 100-25 MG per tablet Take 1 tablet by mouth daily. 03/18/11 03/17/12 Yes Yehuda Savannah, MD  rosuvastatin (CRESTOR) 40 MG tablet Take 40 mg by mouth daily.     Yes Historical Provider, MD  terazosin (HYTRIN) 10 MG capsule Take 10 mg by mouth 2 (two) times daily.  03/18/11 03/17/12 Yes Yehuda Savannah, MD  verapamil (CALAN-SR) 240 MG CR tablet Take 1 tablet (240 mg total) by mouth at bedtime. 10/29/11 10/28/12 Yes Yehuda Savannah, MD  furosemide (LASIX) 40 MG tablet Take 1 tablet (40 mg total) by mouth daily. 11/27/11   Yehuda Savannah, MD  minoxidil (LONITEN) 2.5 MG tablet Take 1 tablet (2.5 mg total) by mouth daily. 11/27/11   Yehuda Savannah, MD   No Known Allergies    Past medical history, social history, and family history reviewed and updated.  ROS: Denies chest discomfort, orthopnea, PND, dyspnea, lightheadedness or syncope., all other systems reviewed and are negative.  PHYSICAL EXAM: BP 198/105  Pulse 72  Ht 6\' 3"  (1.905 m)  Wt 110.678 kg (244 lb)  BMI 30.50 kg/m2  SpO2 99% ; repeat BP-200/85 General-Well developed; no acute distress; muscular Body habitus-mildly overweight Neck-No JVD; no carotid bruits Lungs-clear lung fields; resonant to percussion Cardiovascular-normal PMI; normal S1 and S2; S4 present Abdomen-normal bowel sounds; soft and non-tender without masses or organomegaly Musculoskeletal-No deformities, no cyanosis or clubbing Neurologic-Normal cranial nerves; symmetric strength and tone Skin-Warm, no significant lesions Extremities-distal pulses intact; no edema  ASSESSMENT AND PLAN:  [name], MD 12/06/2011 7:59 PM

## 2011-12-06 NOTE — Progress Notes (Deleted)
Name: Lance [name]    DOB: Dec 13, 1970  Age: 41 y.o.  MR#: [id number]       PCP:  [name], MD      Insurance: @PAYORNAME @    MED BOTTLES REVIEWED  CC:    Chief Complaint  Patient presents with  . Follow-up    HTN    VS BP 198/105  Pulse 72  Ht 6\' 3"  (1.905 m)  Wt 244 lb (110.678 kg)  BMI 30.50 kg/m2  SpO2 99%  Weights Current Weight  12/06/11 244 lb (110.678 kg)  11/19/11 244 lb (110.678 kg)  10/29/11 245 lb (111.131 kg)    Blood Pressure  BP Readings from Last 3 Encounters:  12/06/11 198/105  11/19/11 198/98  10/29/11 208/112     Admit date:  (Not on file) Last encounter with RMR:  10/29/2011   Allergy No Known Allergies  Current Outpatient Prescriptions  Medication Sig Dispense Refill  . aspirin EC 81 MG tablet Take 81 mg by mouth daily.      . carvedilol (COREG) 25 MG tablet Take 2 tablets (50 mg total) by mouth 2 (two) times daily.  120 tablet  11  . cloNIDine (CATAPRES) 0.2 MG tablet Take 1 tablet (0.2 mg total) by mouth every evening.  30 tablet  11  . insulin lispro protamine-insulin lispro (HUMALOG 75/25) (75-25) 100 UNIT/ML SUSP Inject 58-80 Units into the skin daily. 80 -150  58 units 150-300  68 units 300-400  80 units       . insulin NPH-insulin regular (NOVOLIN 50/50) (50-50) 100 UNIT/ML injection Inject 30 Units into the skin daily before supper.        . losartan-hydrochlorothiazide (HYZAAR) 100-25 MG per tablet Take 1 tablet by mouth daily.  30 tablet  12  . rosuvastatin (CRESTOR) 40 MG tablet Take 40 mg by mouth daily.        Marland Kitchen terazosin (HYTRIN) 10 MG capsule Take 10 mg by mouth 2 (two) times daily.       . verapamil (CALAN-SR) 240 MG CR tablet Take 1 tablet (240 mg total) by mouth at bedtime.  30 tablet  11  . furosemide (LASIX) 40 MG tablet Take 1 tablet (40 mg total) by mouth daily.  30 tablet  11  . minoxidil (LONITEN) 2.5 MG tablet Take 1 tablet (2.5 mg total) by mouth daily.  30 tablet  11  . DISCONTD: terazosin (HYTRIN) 5 MG  capsule Take 1 capsule (5 mg total) by mouth at bedtime.  30 capsule  3    Discontinued Meds:   There are no discontinued medications.  Patient Active Problem List  Diagnosis  . Chest pain  . Diabetes mellitus type 1  . Hypoglycemia  . Hypertension, malignant  . Hyperlipidemia  . Diastolic CHF, chronic  . Chronic kidney disease, stage II (mild)    LABS No visits with results within 3 Month(s) from this visit. Latest known visit with results is:  Admission on 09/26/2010, Discharged on 09/27/2010  Component Date Value  . Glucose-Capillary 09/26/2010 23*  . Comment 1 09/26/2010 Repeat Test   . Glucose-Capillary 09/26/2010 31*  . Comment 1 09/26/2010 Call MD NNP PA CNM   . WBC 09/26/2010 9.0   . RBC 09/26/2010 4.93   . Hemoglobin 09/26/2010 15.0   . HCT 09/26/2010 42.6   . MCV 09/26/2010 86.4   . [facility] 09/26/2010 30.4   . MCHC 09/26/2010 35.2   . RDW 09/26/2010 13.6   . Platelets 09/26/2010 238   .  Neutrophils Relative 09/26/2010 49   . Neutro Abs 09/26/2010 4.4   . Lymphocytes Relative 09/26/2010 40   . Lymphs Abs 09/26/2010 3.6   . Monocytes Relative 09/26/2010 8   . Monocytes Absolute 09/26/2010 0.7   . Eosinophils Relative 09/26/2010 2   . Eosinophils Absolute 09/26/2010 0.2   . Basophils Relative 09/26/2010 1   . Basophils Absolute 09/26/2010 0.1   . Troponin I 09/26/2010 <0.30   . Sodium 09/26/2010 139   . Potassium 09/26/2010 3.4*  . Chloride 09/26/2010 105   . CO2 09/26/2010 23   . Glucose, Bld 09/26/2010 26*  . BUN 09/26/2010 25*  . Creatinine, Ser 09/26/2010 1.34   . Calcium 09/26/2010 10.1   . GFR calc non Af Amer 09/26/2010 59*  . GFR calc Af Amer 09/26/2010 >60   . Glucose-Capillary 09/26/2010 140*  . Comment 1 09/26/2010 Documented in Chart   . Hemoglobin A1C 09/26/2010 11.2*  . Mean Plasma Glucose 09/26/2010 275*  . Total CK 09/26/2010 288*  . CK, MB 09/26/2010 6.9*  . Troponin I 09/26/2010 <0.30   . Relative Index 09/26/2010 2.4   .  Glucose-Capillary 09/26/2010 123*  . Total CK 09/26/2010 237*  . CK, MB 09/26/2010 5.9*  . Troponin I 09/26/2010 <0.30   . Relative Index 09/26/2010 2.5   . Glucose-Capillary 09/26/2010 338*  . Total CK 09/26/2010 213   . CK, MB 09/26/2010 5.2*  . Troponin I 09/26/2010 <0.30   . Relative Index 09/26/2010 2.4   . Sodium 09/27/2010 133*  . Potassium 09/27/2010 4.3   . Chloride 09/27/2010 102   . CO2 09/27/2010 18*  . Glucose, Bld 09/27/2010 407*  . BUN 09/27/2010 27*  . Creatinine, Ser 09/27/2010 1.19   . Calcium 09/27/2010 9.1   . GFR calc non Af Amer 09/27/2010 >60   . GFR calc Af Amer 09/27/2010 >60   . WBC 09/27/2010 6.8   . RBC 09/27/2010 4.41   . Hemoglobin 09/27/2010 13.3   . HCT 09/27/2010 38.5*  . MCV 09/27/2010 87.3   . [facility] 09/27/2010 30.2   . MCHC 09/27/2010 34.5   . RDW 09/27/2010 13.6   . Platelets 09/27/2010 194   . Cholesterol 09/27/2010 322*  . Triglycerides 09/27/2010 329*  . HDL 09/27/2010 74   . Total CHOL/HDL Ratio 09/27/2010 4.4   . VLDL 09/27/2010 66*  . LDL Cholesterol 09/27/2010 182*  . Glucose-Capillary 09/26/2010 178*  . Glucose-Capillary 09/27/2010 408*  . Glucose, Bld 09/27/2010 391*  . Glucose-Capillary 09/27/2010 280*     Results for this Opt Visit:     Results for orders placed during the hospital encounter of 09/26/10  GLUCOSE, CAPILLARY      Component Value Range   Glucose-Capillary 23 (*) 70 - 99 mg/dL   Comment 1 Repeat Test    GLUCOSE, CAPILLARY      Component Value Range   Glucose-Capillary 31 (*) 70 - 99 mg/dL   Comment 1 Call MD NNP PA CNM    CBC      Component Value Range   WBC 9.0  4.0 - 10.5 K/uL   RBC 4.93  4.22 - 5.81 MIL/uL   Hemoglobin 15.0  13.0 - 17.0 g/dL   HCT 42.6  39.0 - 52.0 %   MCV 86.4  78.0 - 100.0 fL   MCH 30.4  26.0 - 34.0 pg   MCHC 35.2  30.0 - 36.0 g/dL   RDW 13.6  11.5 - 15.5 %   Platelets 238  150 - 400 K/uL  DIFFERENTIAL      Component Value Range   Neutrophils Relative 49  43 - 77 %    Neutro Abs 4.4  1.7 - 7.7 K/uL   Lymphocytes Relative 40  12 - 46 %   Lymphs Abs 3.6  0.7 - 4.0 K/uL   Monocytes Relative 8  3 - 12 %   Monocytes Absolute 0.7  0.1 - 1.0 K/uL   Eosinophils Relative 2  0 - 5 %   Eosinophils Absolute 0.2  0.0 - 0.7 K/uL   Basophils Relative 1  0 - 1 %   Basophils Absolute 0.1  0.0 - 0.1 K/uL  TROPONIN I      Component Value Range   Troponin I <0.30  <0.30 ng/mL  BASIC METABOLIC PANEL      Component Value Range   Sodium 139  135 - 145 mEq/L   Potassium 3.4 (*) 3.5 - 5.1 mEq/L   Chloride 105  96 - 112 mEq/L   CO2 23  19 - 32 mEq/L   Glucose, Bld 26 (*) 70 - 99 mg/dL   BUN 25 (*) 6 - 23 mg/dL   Creatinine, Ser 1.34  0.50 - 1.35 mg/dL   Calcium 10.1  8.4 - 10.5 mg/dL   GFR calc non Af Amer 59 (*) >60 mL/min   GFR calc Af Amer >60  >60 mL/min  GLUCOSE, CAPILLARY      Component Value Range   Glucose-Capillary 140 (*) 70 - 99 mg/dL   Comment 1 Documented in Chart    HEMOGLOBIN A1C      Component Value Range   Hemoglobin A1C 11.2 (*) <5.7 %   Mean Plasma Glucose 275 (*) <117 mg/dL  CARDIAC PANEL(CRET KIN+CKTOT+MB+TROPI)      Component Value Range   Total CK 288 (*) 7 - 232 U/L   CK, MB 6.9 (*) 0.3 - 4.0 ng/mL   Troponin I <0.30  <0.30 ng/mL   Relative Index 2.4  0.0 - 2.5  GLUCOSE, CAPILLARY      Component Value Range   Glucose-Capillary 123 (*) 70 - 99 mg/dL  CARDIAC PANEL(CRET KIN+CKTOT+MB+TROPI)      Component Value Range   Total CK 237 (*) 7 - 232 U/L   CK, MB 5.9 (*) 0.3 - 4.0 ng/mL   Troponin I <0.30  <0.30 ng/mL   Relative Index 2.5  0.0 - 2.5  GLUCOSE, CAPILLARY      Component Value Range   Glucose-Capillary 338 (*) 70 - 99 mg/dL  CARDIAC PANEL(CRET KIN+CKTOT+MB+TROPI)      Component Value Range   Total CK 213  7 - 232 U/L   CK, MB 5.2 (*) 0.3 - 4.0 ng/mL   Troponin I <0.30  <0.30 ng/mL   Relative Index 2.4  0.0 - 2.5  BASIC METABOLIC PANEL      Component Value Range   Sodium 133 (*) 135 - 145 mEq/L   Potassium 4.3  3.5 - 5.1  mEq/L   Chloride 102  96 - 112 mEq/L   CO2 18 (*) 19 - 32 mEq/L   Glucose, Bld 407 (*) 70 - 99 mg/dL   BUN 27 (*) 6 - 23 mg/dL   Creatinine, Ser 1.19  0.50 - 1.35 mg/dL   Calcium 9.1  8.4 - 10.5 mg/dL   GFR calc non Af Amer >60  >60 mL/min   GFR calc Af Amer >60  >60 mL/min  CBC  Component Value Range   WBC 6.8  4.0 - 10.5 K/uL   RBC 4.41  4.22 - 5.81 MIL/uL   Hemoglobin 13.3  13.0 - 17.0 g/dL   HCT 38.5 (*) 39.0 - 52.0 %   MCV 87.3  78.0 - 100.0 fL   MCH 30.2  26.0 - 34.0 pg   MCHC 34.5  30.0 - 36.0 g/dL   RDW 13.6  11.5 - 15.5 %   Platelets 194  150 - 400 K/uL  LIPID PANEL      Component Value Range   Cholesterol 322 (*) 0 - 200 mg/dL   Triglycerides 329 (*) <150 mg/dL   HDL 74  >39 mg/dL   Total CHOL/HDL Ratio 4.4     VLDL 66 (*) 0 - 40 mg/dL   LDL Cholesterol 182 (*) 0 - 99 mg/dL  GLUCOSE, CAPILLARY      Component Value Range   Glucose-Capillary 178 (*) 70 - 99 mg/dL  GLUCOSE, CAPILLARY      Component Value Range   Glucose-Capillary 408 (*) 70 - 99 mg/dL  GLUCOSE, RANDOM      Component Value Range   Glucose, Bld 391 (*) 70 - 99 mg/dL  GLUCOSE, CAPILLARY      Component Value Range   Glucose-Capillary 280 (*) 70 - 99 mg/dL    EKG Orders placed in visit on 10/08/10  . EXERCISE TOLERANCE TEST  . EXERCISE TOLERANCE TEST     Prior Assessment and Plan Problem List as of 12/06/2011            Cardiology Problems   Hypertension, malignant   Last Assessment & Plan Note   10/29/2011 Office Visit Signed 10/29/2011  4:42 PM by Yehuda Savannah, MD    Blood pressure is about as good as it has been, but still suboptimally controlled.  I doubt that his current medical regime will be entirely effective, but will continue to titrate dosages upward.  Verapamil will be increased to 240 and then 360 mg per day and carvedilol to 50 mg twice a day.  Patient will continue to monitor heart rate and blood pressure.  RN visit in one month to include a rhythm strip.     Hyperlipidemia   Last Assessment & Plan Note   09/26/2011 Office Visit Addendum 09/29/2011 11:03 PM by Yehuda Savannah, MD    Patient developed adverse effects of high-dose rosuvastatin.  Dosage has been reduced to 10 mg 3 days per week by Dr. [name], but I doubt this will be effective in managing patients fairly severe hyperlipidemia.  A repeat lipid profile will be obtained.    Diastolic CHF, chronic   Last Assessment & Plan Note   03/18/2011 Office Visit Signed 03/18/2011  2:00 PM by Yehuda Savannah, MD    CHF has been compensated with better control of hypertension and a modest dose of diuretic.      Other   Chest pain   Last Assessment & Plan Note   09/26/2011 Office Visit Signed 09/26/2011  1:39 PM by Yehuda Savannah, MD    Patient denies any recent recurrent chest discomfort.    Diabetes mellitus type 1   Last Assessment & Plan Note   09/26/2011 Office Visit Signed 09/26/2011  1:40 PM by Yehuda Savannah, MD    Patient's diabetologist, Dr. [name], has been working closely with him to achieve improved diabetic control.  FBS in 07/2011 was 122 with a fructosamine of 238, which is "within the normal  range".    Hypoglycemia   Chronic kidney disease, stage II (mild)       Imaging: No results found.   FRS Calculation: Score not calculated. Missing: Total Cholesterol

## 2011-12-06 NOTE — Assessment & Plan Note (Signed)
It is unclear whether previous lipid measurements were obtained while patient treated with current statin.  A repeat lipid profile will be drawn.

## 2011-12-06 NOTE — Patient Instructions (Addendum)
Your physician recommends that you schedule a follow-up appointment in: 4 months  Your physician has recommended you make the following change in your medication: DO NOT take Minoxidil  24 hour blood pressure monitor  Your physician recommends that you return for lab work in: Within the week

## 2011-12-06 NOTE — Assessment & Plan Note (Addendum)
Renal dysfunction-mild and stable.  We will continue to monitor electrolytes and renal function.

## 2011-12-06 NOTE — Assessment & Plan Note (Addendum)
Blood pressure control appears relatively good by home measurements, but not so in the office.  This may represent white coat hypertension superimposed on generally well-managed BP control.  24 hour monitoring will be obtained to exclude the effect of measurement in a healthcare setting.  Patient will defer taking minoxidil until he returns from a trip to [place].  Serial laboratory studies will be obtained to verify preservation of renal function and normal or near normal electrolytes.

## 2011-12-06 NOTE — Assessment & Plan Note (Signed)
No recent episodes of chest discomfort as of 11/2011.

## 2011-12-26 ENCOUNTER — Other Ambulatory Visit: Payer: Self-pay | Admitting: *Deleted

## 2011-12-26 ENCOUNTER — Encounter: Payer: Self-pay | Admitting: *Deleted

## 2011-12-26 DIAGNOSIS — I10 Essential (primary) hypertension: Secondary | ICD-10-CM

## 2011-12-26 DIAGNOSIS — E782 Mixed hyperlipidemia: Secondary | ICD-10-CM

## 2011-12-27 ENCOUNTER — Other Ambulatory Visit: Payer: Self-pay | Admitting: *Deleted

## 2011-12-27 DIAGNOSIS — I10 Essential (primary) hypertension: Secondary | ICD-10-CM

## 2011-12-30 ENCOUNTER — Ambulatory Visit (HOSPITAL_COMMUNITY)
Admission: RE | Admit: 2011-12-30 | Discharge: 2011-12-30 | Disposition: A | Discharge status: Home / Self Care | Payer: BC Managed Care – PPO | Source: Ambulatory Visit | Attending: Cardiology | Admitting: Cardiology

## 2011-12-30 DIAGNOSIS — I10 Essential (primary) hypertension: Secondary | ICD-10-CM

## 2011-12-30 DIAGNOSIS — N182 Chronic kidney disease, stage 2 (mild): Secondary | ICD-10-CM | POA: Insufficient documentation

## 2011-12-30 DIAGNOSIS — I129 Hypertensive chronic kidney disease with stage 1 through stage 4 chronic kidney disease, or unspecified chronic kidney disease: Secondary | ICD-10-CM | POA: Insufficient documentation

## 2011-12-30 NOTE — Progress Notes (Signed)
Ambulatory Blood Pressure Monitor in progress 24hrs.

## 2011-12-31 LAB — BASIC METABOLIC PANEL
BUN: 37 mg/dL — ABNORMAL HIGH (ref 6–23)
CO2: 23 mEq/L (ref 19–32)
Calcium: 8.7 mg/dL (ref 8.4–10.5)
Chloride: 106 mEq/L (ref 96–112)
Creat: 1.92 mg/dL — ABNORMAL HIGH (ref 0.50–1.35)

## 2011-12-31 LAB — LIPID PANEL
Cholesterol: 281 mg/dL — ABNORMAL HIGH (ref 0–200)
HDL: 46 mg/dL (ref >39)
LDL Cholesterol: 199 mg/dL — ABNORMAL HIGH (ref 0–99)
Triglycerides: 180 mg/dL — ABNORMAL HIGH (ref <150)
VLDL: 36 mg/dL (ref 0–40)

## 2012-01-01 ENCOUNTER — Other Ambulatory Visit: Payer: Self-pay | Admitting: *Deleted

## 2012-01-01 ENCOUNTER — Encounter: Payer: Self-pay | Admitting: *Deleted

## 2012-01-01 ENCOUNTER — Encounter: Payer: Self-pay | Admitting: Cardiology

## 2012-01-01 ENCOUNTER — Telehealth: Payer: Self-pay | Admitting: *Deleted

## 2012-01-01 DIAGNOSIS — I10 Essential (primary) hypertension: Secondary | ICD-10-CM

## 2012-01-01 DIAGNOSIS — G4730 Sleep apnea, unspecified: Secondary | ICD-10-CM

## 2012-01-01 DIAGNOSIS — E782 Mixed hyperlipidemia: Secondary | ICD-10-CM

## 2012-01-01 LAB — HEPATIC FUNCTION PANEL
ALT: 9 U/L (ref 0–53)
Bilirubin, Direct: 0.1 mg/dL (ref 0.0–0.3)
Indirect Bilirubin: 0.3 mg/dL (ref 0.0–0.9)
Total Bilirubin: 0.4 mg/dL (ref 0.3–1.2)

## 2012-01-01 NOTE — Telephone Encounter (Signed)
Blood pressure elevation was primarily at night received a question of obstructive sleep apnea. If patient has not previously undergone a sleep study, please arrange one for him.      Attempted to contact patient regarding lab results and above recommendations per Dr Lattie Haw

## 2012-01-03 NOTE — Telephone Encounter (Signed)
Spoke to patient and sleep study scheduled.

## 2012-01-17 ENCOUNTER — Telehealth: Payer: Self-pay | Admitting: Internal Medicine

## 2012-01-17 ENCOUNTER — Telehealth: Payer: Self-pay | Admitting: Cardiology

## 2012-01-17 NOTE — Telephone Encounter (Signed)
Pt was advised by MD Harrington Challenger to discontinue the Minoxidil, and take another lasix for today, also advised pt needs to have an apt set up with RR next week to discuss the next steps, this nurse gave verbal to staff scheduler to call pt with next available apt with RR next week, pt also advise MD Harrington Challenger that his sleep study had not been scheduled at this time, noted chart telephone notation on 01-01-12 stating the order was placed and pt was made aware of apt, scheduler will call respiratory to clarify apt/results or complication and was advised that the pt sleep study apt/information has been received in the office however no apt has been made at this time and the staff member in charge of scheduling those apts will be out of office til Monday however will be notified Monday as to the apt need for this pt to have sleep study performed per MD Harrington Challenger advised possible SA Dx may have some affects on pt BP issues, pt will be notified be scheduled for an apt with RR next week by our office scheduler today and informed someone will call him soon with the apt date and time for the sleep study

## 2012-01-17 NOTE — Telephone Encounter (Signed)
Noted pt has an upcoming apt scheduled with RR on 01-22-12 at 3:15pm, pt will address the concerns at that time and is aware to call office back with any further concerns prior to the office visit

## 2012-01-17 NOTE — Telephone Encounter (Signed)
Patient called Started minoxidl 2 wks ago   Now wt is up 10 lbs with edemia in ankles and thighs. Vision is intermitt blurry Does say BP is better but he is dizzy at times  Rec:  Stop minoxidil Take extra lasix today. Will see about f/u appt  Patient says he has not heard back about sched sleep study.

## 2012-01-17 NOTE — Telephone Encounter (Signed)
Pt states started new medication about two weeks ago, he has since gained about ten pounds. States that he is having trouble with his vision also.

## 2012-01-17 NOTE — Telephone Encounter (Signed)
Pt states that his eye sight blurs in and out to the point he only sees images, this nurse spoke to MD Harrington Challenger and call was transferred to MD Harrington Challenger to address

## 2012-01-22 ENCOUNTER — Ambulatory Visit (INDEPENDENT_AMBULATORY_CARE_PROVIDER_SITE_OTHER): Payer: BC Managed Care – PPO | Admitting: Cardiology

## 2012-01-22 ENCOUNTER — Encounter: Payer: Self-pay | Admitting: Cardiology

## 2012-01-22 VITALS — BP 140/96 | HR 80 | Ht 75.0 in | Wt 251.0 lb

## 2012-01-22 DIAGNOSIS — N182 Chronic kidney disease, stage 2 (mild): Secondary | ICD-10-CM

## 2012-01-22 DIAGNOSIS — I10 Essential (primary) hypertension: Secondary | ICD-10-CM

## 2012-01-22 MED ORDER — FUROSEMIDE 40 MG PO TABS: ORAL_TABLET | ORAL | Status: DC

## 2012-01-22 NOTE — Assessment & Plan Note (Signed)
Effect of increased diuresis on hypertension will be assessed before we proceed with additional pharmacologic therapy.

## 2012-01-22 NOTE — Patient Instructions (Addendum)
Stop Minoxidil  Increase Lasix 40 mg take 1 1/2 tablets daily.   Lab work in 2 weeks 02/06/12 ( bmet ) you may eat before you have done.   Need Blood Pressure Check in 2 weeks   Your physician recommends that you schedule a follow-up appointment in: 1 month.

## 2012-01-22 NOTE — Progress Notes (Deleted)
Name: Lance [name]    DOB: 05-20-1970  Age: 41 y.o.  MR#: [id number]       PCP:  [name], MD      Insurance: @PAYORNAME @   CC:   No chief complaint on file. BOTTLES   VS BP 140/96  Pulse 80  Ht 6\' 3"  (1.905 m)  Wt 251 lb (113.853 kg)  BMI 31.37 kg/m2  SpO2 94%  Weights Current Weight  01/22/12 251 lb (113.853 kg)  12/06/11 244 lb (110.678 kg)  11/19/11 244 lb (110.678 kg)    Blood Pressure  BP Readings from Last 3 Encounters:  01/22/12 140/96  12/06/11 198/105  11/19/11 198/98     Admit date:  (Not on file) Last encounter with RMR:  01/17/2012   Allergy No Known Allergies  Current Outpatient Prescriptions  Medication Sig Dispense Refill  . aspirin EC 81 MG tablet Take 81 mg by mouth daily.      . carvedilol (COREG) 25 MG tablet Take 2 tablets (50 mg total) by mouth 2 (two) times daily.  120 tablet  11  . cloNIDine (CATAPRES) 0.2 MG tablet Take 1 tablet (0.2 mg total) by mouth every evening.  30 tablet  11  . furosemide (LASIX) 40 MG tablet Take 1 tablet (40 mg total) by mouth daily.  30 tablet  11  . insulin lispro protamine-insulin lispro (HUMALOG 75/25) (75-25) 100 UNIT/ML SUSP Inject 58-80 Units into the skin daily. 80 -150  58 units 150-300  68 units 300-400  80 units       . insulin NPH-insulin regular (NOVOLIN 50/50) (50-50) 100 UNIT/ML injection Inject 30 Units into the skin daily before supper.        . losartan-hydrochlorothiazide (HYZAAR) 100-25 MG per tablet Take 1 tablet by mouth daily.  30 tablet  12  . minoxidil (LONITEN) 2.5 MG tablet Take 1 tablet (2.5 mg total) by mouth daily.  30 tablet  11  . rosuvastatin (CRESTOR) 40 MG tablet Take 40 mg by mouth daily.        Marland Kitchen terazosin (HYTRIN) 10 MG capsule Take 10 mg by mouth 2 (two) times daily.       . verapamil (CALAN-SR) 240 MG CR tablet Take 1 tablet (240 mg total) by mouth at bedtime.  30 tablet  11    Discontinued Meds:   There are no discontinued medications.  Patient Active Problem List   Diagnosis  . Chest pain  . Diabetes mellitus type 1  . Hypoglycemia  . Hypertension, malignant  . Hyperlipidemia  . Chronic kidney disease, stage II (mild)    LABS Orders Only on 12/26/2011  Component Date Value  . Total Bilirubin 12/31/2011 0.4   . Bilirubin, Direct 12/31/2011 0.1   . Indirect Bilirubin 12/31/2011 0.3   . Alkaline Phosphatase 12/31/2011 70   . AST 12/31/2011 10   . ALT 12/31/2011 9   . Total Protein 12/31/2011 5.9*  . Albumin 12/31/2011 2.9*  . Cholesterol 12/26/2011 281*  . Triglycerides 12/26/2011 180*  . HDL 12/26/2011 46   . Total CHOL/HDL Ratio 12/26/2011 6.1   . VLDL 12/26/2011 36   . LDL Cholesterol 12/26/2011 199*  . Sodium 12/31/2011 137   . Potassium 12/31/2011 4.6   . Chloride 12/31/2011 106   . CO2 12/31/2011 23   . Glucose, Bld 12/31/2011 201*  . BUN 12/31/2011 37*  . Creat 12/31/2011 1.92*  . Calcium 12/31/2011 8.7      Results for this Opt Visit:  Results for orders placed in visit on 12/26/11  HEPATIC FUNCTION PANEL      Component Value Range   Total Bilirubin 0.4  0.3 - 1.2 mg/dL   Bilirubin, Direct 0.1  0.0 - 0.3 mg/dL   Indirect Bilirubin 0.3  0.0 - 0.9 mg/dL   Alkaline Phosphatase 70  39 - 117 U/L   AST 10  0 - 37 U/L   ALT 9  0 - 53 U/L   Total Protein 5.9 (*) 6.0 - 8.3 g/dL   Albumin 2.9 (*) 3.5 - 5.2 g/dL  LIPID PANEL      Component Value Range   Cholesterol 281 (*) 0 - 200 mg/dL   Triglycerides 180 (*) <150 mg/dL   HDL 46  >39 mg/dL   Total CHOL/HDL Ratio 6.1     VLDL 36  0 - 40 mg/dL   LDL Cholesterol 199 (*) 0 - 99 mg/dL  BASIC METABOLIC PANEL      Component Value Range   Sodium 137  135 - 145 mEq/L   Potassium 4.6  3.5 - 5.3 mEq/L   Chloride 106  96 - 112 mEq/L   CO2 23  19 - 32 mEq/L   Glucose, Bld 201 (*) 70 - 99 mg/dL   BUN 37 (*) 6 - 23 mg/dL   Creat 1.92 (*) 0.50 - 1.35 mg/dL   Calcium 8.7  8.4 - 10.5 mg/dL    EKG Orders placed during the hospital encounter of 12/30/11  . AMB BP MONITORING   . AMB BP MONITORING     Prior Assessment and Plan Problem List as of 01/22/2012          Chest pain   Last Assessment & Plan Note   12/06/2011 Office Visit Signed 12/06/2011  8:05 PM by Yehuda Savannah, MD    No recent episodes of chest discomfort as of 11/2011.    Diabetes mellitus type 1   Last Assessment & Plan Note   09/26/2011 Office Visit Signed 09/26/2011  1:40 PM by Yehuda Savannah, MD    Patient's diabetologist, Dr. [name], has been working closely with him to achieve improved diabetic control.  FBS in 07/2011 was 122 with a fructosamine of 238, which is "within the normal range".    Hypoglycemia   Hypertension, malignant   Last Assessment & Plan Note   12/06/2011 Office Visit Addendum 12/14/2011 10:58 AM by Yehuda Savannah, MD    Blood pressure control appears relatively good by home measurements, but not so in the office.  This may represent white coat hypertension superimposed on generally well-managed BP control.  24 hour monitoring will be obtained to exclude the effect of measurement in a healthcare setting.  Patient will defer taking minoxidil until he returns from a trip to [place].  Serial laboratory studies will be obtained to verify preservation of renal function and normal or near normal electrolytes.    Hyperlipidemia   Last Assessment & Plan Note   12/06/2011 Office Visit Signed 12/06/2011  8:09 PM by Yehuda Savannah, MD    It is unclear whether previous lipid measurements were obtained while patient treated with current statin.  A repeat lipid profile will be drawn.    Chronic kidney disease, stage II (mild)   Last Assessment & Plan Note   12/06/2011 Office Visit Addendum 12/14/2011 10:58 AM by Yehuda Savannah, MD    Renal dysfunction-mild and stable.  We will continue to monitor electrolytes and renal function.  Imaging: No results found.   FRS Calculation: Score not calculated. Missing: Total Cholesterol

## 2012-01-22 NOTE — Progress Notes (Signed)
Patient ID: Lance [name], male   DOB: 08-07-1970, 41 y.o.   MRN: [id number]  HPI: One of multiple visits for this very nicely gentleman with difficult to control and malignant hypertension.  Immediately following his previous visit, [place].  On his return, blood pressure control was somewhat improved but not adequate.  An ambulatory blood pressure monitor verified inadequate control as well as an increase in blood pressure during the nocturnal hours.  Very low-dose minoxidil was added to his regimen plus furosemide and plans were made for a sleep study.  He subsequently noted progressive weight gain and swelling with edema in both lower extremities to the thighs, intermittent lightheadedness and weakness but no real dyspnea or chest discomfort.  Minoxidil was stopped and furosemide continued resulting in diuresis and improvement in edema, but also an increase in blood pressure, which had been well controlled while taking minoxidil.  As of this visit, he feels that his general sense of wellbeing has returned to baseline.  Prior to Admission medications   Medication Sig Start Date End Date Taking? Authorizing Provider  aspirin EC 81 MG tablet Take 81 mg by mouth daily.   Yes Historical Provider, MD  carvedilol (COREG) 25 MG tablet Take 2 tablets (50 mg total) by mouth 2 (two) times daily. 10/29/11 10/28/12 Yes Yehuda Savannah, MD  cloNIDine (CATAPRES) 0.2 MG tablet Take 1 tablet (0.2 mg total) by mouth every evening. 09/26/11 09/25/12 Yes Yehuda Savannah, MD  insulin lispro protamine-insulin lispro (HUMALOG 75/25) (75-25) 100 UNIT/ML SUSP Inject 58-80 Units into the skin daily. 80 -150  58 units 150-300  68 units 300-400  80 units    Yes Historical Provider, MD  insulin NPH-insulin regular (NOVOLIN 50/50) (50-50) 100 UNIT/ML injection Inject 30 Units into the skin daily before supper.     Yes Historical Provider, MD  losartan-hydrochlorothiazide (HYZAAR)  100-25 MG per tablet Take 1 tablet by mouth daily. 03/18/11 03/17/12 Yes Yehuda Savannah, MD  rosuvastatin (CRESTOR) 40 MG tablet Take 40 mg by mouth daily.     Yes Historical Provider, MD  terazosin (HYTRIN) 10 MG capsule Take 10 mg by mouth 2 (two) times daily.  03/18/11 03/17/12 Yes Yehuda Savannah, MD  verapamil (CALAN-SR) 240 MG CR tablet Take 1 tablet (240 mg total) by mouth at bedtime. 10/29/11 10/28/12 Yes Yehuda Savannah, MD  furosemide (LASIX) 40 MG tablet Take 11/2 tablets daily 01/22/12   Yehuda Savannah, MD  No Known Allergies    Past medical history, social history, and family history reviewed and updated.  ROS: Denies orthopnea, PND, palpitations or loss of consciousness.  He has not fallen, but has felt unsteady on his feet.  All other systems reviewed and are negative.  PHYSICAL EXAM: BP 140/96  Pulse 80  Ht 6\' 3"  (1.905 m)  Wt 113.853 kg (251 lb)  BMI 31.37 kg/m2  SpO2 94%  General-Well developed; no acute distress Body habitus-Overweight Neck-No JVD; no carotid bruits Lungs-clear lung fields; resonant to percussion Cardiovascular-normal PMI; normal S1 and S2 Abdomen-normal bowel sounds; soft and non-tender without masses or organomegaly Musculoskeletal-No deformities, no cyanosis or clubbing Neurologic-Normal cranial nerves; symmetric strength and tone Skin-Warm, no significant lesions Extremities-distal pulses intact; 1/2+ ankle edema  ASSESSMENT AND PLAN:  [name], MD 01/22/2012 9:23 PM

## 2012-01-22 NOTE — Assessment & Plan Note (Signed)
In the setting of chronic kidney disease, a loop diuretic may be necessary to achieve an optimal volume status.  Furosemide will be continued at a dose of 60 mg per day in addition to the patient's low-dose thiazide diuretic and renal function and electrolytes monitored.

## 2012-01-24 ENCOUNTER — Encounter: Payer: Self-pay | Admitting: *Deleted

## 2012-02-10 ENCOUNTER — Ambulatory Visit (INDEPENDENT_AMBULATORY_CARE_PROVIDER_SITE_OTHER): Payer: BC Managed Care – PPO | Admitting: *Deleted

## 2012-02-10 VITALS — BP 182/99 | HR 78 | Ht 75.0 in | Wt 242.0 lb

## 2012-02-10 DIAGNOSIS — I10 Essential (primary) hypertension: Secondary | ICD-10-CM

## 2012-02-10 NOTE — Progress Notes (Signed)
Presents for blood pressure check.  Continues to be hypertensive, at this visit.  States taking medications, as prescribed.  Also states that blood pressures are normal at home, however did not bring a list.  No complaints at this time.

## 2012-02-11 NOTE — Progress Notes (Signed)
Patient ID: Lance [name], male   DOB: August 23, 1970, 41 y.o.   MRN: [id number]  Increase furosemide to 80 mg per day Amlodipine 5 mg per day Blood pressure check 2 weeks BMet 2 and 4 weeks.

## 2012-02-13 MED ORDER — FUROSEMIDE 80 MG PO TABS: 80.0000 mg | ORAL_TABLET | Freq: Every day | ORAL | Status: DC

## 2012-02-13 MED ORDER — AMLODIPINE BESYLATE 5 MG PO TABS: 5.0000 mg | ORAL_TABLET | Freq: Every day | ORAL | Status: DC

## 2012-02-13 NOTE — Progress Notes (Signed)
Recommendations given to patient, who verbalizes understanding.  Has a follow up visit with Dr Lattie Haw on 1/13, therefore we will assess vital signs at that visit.  Patient will have BMET done, prior to that appointment.

## 2012-02-21 ENCOUNTER — Other Ambulatory Visit: Payer: Self-pay | Admitting: *Deleted

## 2012-02-21 DIAGNOSIS — E782 Mixed hyperlipidemia: Secondary | ICD-10-CM

## 2012-02-21 DIAGNOSIS — I10 Essential (primary) hypertension: Secondary | ICD-10-CM

## 2012-02-24 ENCOUNTER — Encounter: Payer: Self-pay | Admitting: Cardiology

## 2012-02-24 ENCOUNTER — Encounter: Payer: Self-pay | Admitting: *Deleted

## 2012-02-24 ENCOUNTER — Ambulatory Visit (INDEPENDENT_AMBULATORY_CARE_PROVIDER_SITE_OTHER): Payer: BC Managed Care – PPO | Admitting: Cardiology

## 2012-02-24 VITALS — BP 168/98 | HR 89 | Ht 75.0 in | Wt 242.0 lb

## 2012-02-24 DIAGNOSIS — I10 Essential (primary) hypertension: Secondary | ICD-10-CM

## 2012-02-24 DIAGNOSIS — N182 Chronic kidney disease, stage 2 (mild): Secondary | ICD-10-CM

## 2012-02-24 DIAGNOSIS — E785 Hyperlipidemia, unspecified: Secondary | ICD-10-CM

## 2012-02-24 MED ORDER — SPIRONOLACTONE 25 MG PO TABS: 25.0000 mg | ORAL_TABLET | Freq: Every day | ORAL | Status: DC

## 2012-02-24 MED ORDER — AMLODIPINE BESYLATE 10 MG PO TABS: 10.0000 mg | ORAL_TABLET | Freq: Every day | ORAL | Status: DC

## 2012-02-24 NOTE — Assessment & Plan Note (Signed)
In light of increased dose of furosemide, renal function and electrolytes will be reassessed.

## 2012-02-24 NOTE — Progress Notes (Signed)
Patient ID: Lance [name], male   DOB: Jan 20, 1971, 42 y.o.   MRN: [id number]  HPI: Scheduled return visit for this very nice gentleman with severe and resistant hypertension. Since his last visit, he has remained asymptomatic from a cardiopulmonary standpoint. Blood pressure measurements indicate typical blood pressure of 155/90 with values as high as 190/110. Blood pressures typically recorded later in the day.  Medication vials reviewed-all prescriptions refilled within the past month.  Prior to Admission medications   Medication Sig Start Date End Date Taking? Authorizing Provider  aspirin EC 81 MG tablet Take 81 mg by mouth daily.   Yes Historical Provider, MD  carvedilol (COREG) 25 MG tablet Take 2 tablets (50 mg total) by mouth 2 (two) times daily. 10/29/11 10/28/12 Yes Yehuda Savannah, MD  cloNIDine (CATAPRES) 0.2 MG tablet Take 1 tablet (0.2 mg total) by mouth every evening. 09/26/11 09/25/12 Yes Yehuda Savannah, MD  furosemide (LASIX) 80 MG tablet Take 1 tablet (80 mg total) by mouth daily. Take 11/2 tablets daily 02/13/12  Yes Yehuda Savannah, MD  insulin lispro protamine-insulin lispro (HUMALOG 75/25) (75-25) 100 UNIT/ML SUSP Inject 58-80 Units into the skin daily. 80 -150  58 units 150-300  68 units 300-400  80 units    Yes Historical Provider, MD  insulin NPH-insulin regular (NOVOLIN 50/50) (50-50) 100 UNIT/ML injection Inject 30 Units into the skin daily before supper.     Yes Historical Provider, MD  losartan-hydrochlorothiazide (HYZAAR) 100-25 MG per tablet Take 1 tablet by mouth daily. 03/18/11 03/17/12 Yes Yehuda Savannah, MD  terazosin (HYTRIN) 10 MG capsule Take 10 mg by mouth 2 (two) times daily.  03/18/11 03/17/12 Yes Yehuda Savannah, MD  verapamil (CALAN-SR) 240 MG CR tablet Take 1 tablet (240 mg total) by mouth at bedtime. 10/29/11 10/28/12 Yes Yehuda Savannah, MD  amLODipine (NORVASC) 10 MG tablet Take 1 tablet (10 mg total) by mouth daily. 02/24/12   Yehuda Savannah, MD    spironolactone (ALDACTONE) 25 MG tablet Take 1 tablet (25 mg total) by mouth daily. 02/24/12   Yehuda Savannah, MD   Allergies  Allergen Reactions  . Minoxidil Other (See Comments)    Fluid retention  Past medical history, social history, and family history reviewed and updated.  ROS: Denies dyspnea, orthopnea, PND or chest pain. All other systems reviewed and are negative.  PHYSICAL EXAM: BP 168/98  Pulse 89  Ht 6\' 3"  (1.905 m)  Wt 109.77 kg (242 lb)  BMI 30.25 kg/m2  SpO2 99%  General-Well developed; no acute distress  Body habitus-mildly overweight  Neck-No JVD; no carotid bruits  Lungs-clear lung fields; resonant to percussion  Cardiovascular-normal PMI; normal S1 and S2  Abdomen-normal bowel sounds; soft and non-tender without masses or organomegaly  Musculoskeletal-No deformities, no cyanosis or clubbing  Neurologic-Normal cranial nerves; symmetric strength and tone  Skin-Warm, no significant lesions  Extremities-distal pulses intact; 1/2+ ankle edema  ASSESSMENT AND PLAN:  [name], MD 02/24/2012 10:05 PM

## 2012-02-24 NOTE — Progress Notes (Deleted)
Name: Lance [name]    DOB: [birth year]  Age: 42 y.o.  MR#: [id number]       PCP:  [name], MD      Insurance: @PAYORNAME @   CC:   No chief complaint on file.  MEDICATION BOTTLES STILL HAS NOT DONE LABS!!!!!  VS BP 168/98  Pulse 89  Ht 6\' 3"  (1.905 m)  Wt 242 lb (109.77 kg)  BMI 30.25 kg/m2  SpO2 99%  Weights Current Weight  02/24/12 242 lb (109.77 kg)  02/10/12 242 lb (109.77 kg)  01/22/12 251 lb (113.853 kg)    Blood Pressure  BP Readings from Last 3 Encounters:  02/24/12 168/98  02/10/12 182/99  01/22/12 140/96     Admit date:  (Not on file) Last encounter with RMR:  01/22/2012   Allergy Allergies  Allergen Reactions  . Minoxidil Other (See Comments)    Fluid retention    Current Outpatient Prescriptions  Medication Sig Dispense Refill  . amLODipine (NORVASC) 5 MG tablet Take 1 tablet (5 mg total) by mouth daily.  30 tablet  6  . aspirin EC 81 MG tablet Take 81 mg by mouth daily.      . carvedilol (COREG) 25 MG tablet Take 2 tablets (50 mg total) by mouth 2 (two) times daily.  120 tablet  11  . cloNIDine (CATAPRES) 0.2 MG tablet Take 1 tablet (0.2 mg total) by mouth every evening.  30 tablet  11  . furosemide (LASIX) 80 MG tablet Take 1 tablet (80 mg total) by mouth daily. Take 11/2 tablets daily  30 tablet  6  . insulin lispro protamine-insulin lispro (HUMALOG 75/25) (75-25) 100 UNIT/ML SUSP Inject 58-80 Units into the skin daily. 80 -150  58 units 150-300  68 units 300-400  80 units       . insulin NPH-insulin regular (NOVOLIN 50/50) (50-50) 100 UNIT/ML injection Inject 30 Units into the skin daily before supper.        . losartan-hydrochlorothiazide (HYZAAR) 100-25 MG per tablet Take 1 tablet by mouth daily.  30 tablet  12  . terazosin (HYTRIN) 10 MG capsule Take 10 mg by mouth 2 (two) times daily.       . verapamil (CALAN-SR) 240 MG CR tablet Take 1 tablet (240 mg total) by mouth at bedtime.  30 tablet  11    Discontinued Meds:    Medications  Discontinued During This Encounter  Medication Reason  . rosuvastatin (CRESTOR) 40 MG tablet Error    Patient Active Problem List  Diagnosis  . Diabetes mellitus type 1  . Hypoglycemia  . Hypertension, malignant  . Hyperlipidemia  . Chronic kidney disease, stage II (mild)    LABS Orders Only on 12/26/2011  Component Date Value  . Total Bilirubin 12/31/2011 0.4   . Bilirubin, Direct 12/31/2011 0.1   . Indirect Bilirubin 12/31/2011 0.3   . Alkaline Phosphatase 12/31/2011 70   . AST 12/31/2011 10   . ALT 12/31/2011 9   . Total Protein 12/31/2011 5.9*  . Albumin 12/31/2011 2.9*  . Cholesterol 12/26/2011 281*  . Triglycerides 12/26/2011 180*  . HDL 12/26/2011 46   . Total CHOL/HDL Ratio 12/26/2011 6.1   . VLDL 12/26/2011 36   . LDL Cholesterol 12/26/2011 199*  . Sodium 12/31/2011 137   . Potassium 12/31/2011 4.6   . Chloride 12/31/2011 106   . CO2 12/31/2011 23   . Glucose, Bld 12/31/2011 201*  . BUN 12/31/2011 37*  . Creat 12/31/2011 1.92*  .  Calcium 12/31/2011 8.7      Results for this Opt Visit:     Results for orders placed in visit on 12/26/11  HEPATIC FUNCTION PANEL      Component Value Range   Total Bilirubin 0.4  0.3 - 1.2 mg/dL   Bilirubin, Direct 0.1  0.0 - 0.3 mg/dL   Indirect Bilirubin 0.3  0.0 - 0.9 mg/dL   Alkaline Phosphatase 70  39 - 117 U/L   AST 10  0 - 37 U/L   ALT 9  0 - 53 U/L   Total Protein 5.9 (*) 6.0 - 8.3 g/dL   Albumin 2.9 (*) 3.5 - 5.2 g/dL  LIPID PANEL      Component Value Range   Cholesterol 281 (*) 0 - 200 mg/dL   Triglycerides 180 (*) <150 mg/dL   HDL 46  >39 mg/dL   Total CHOL/HDL Ratio 6.1     VLDL 36  0 - 40 mg/dL   LDL Cholesterol 199 (*) 0 - 99 mg/dL  BASIC METABOLIC PANEL      Component Value Range   Sodium 137  135 - 145 mEq/L   Potassium 4.6  3.5 - 5.3 mEq/L   Chloride 106  96 - 112 mEq/L   CO2 23  19 - 32 mEq/L   Glucose, Bld 201 (*) 70 - 99 mg/dL   BUN 37 (*) 6 - 23 mg/dL   Creat 1.92 (*) 0.50 - 1.35 mg/dL    Calcium 8.7  8.4 - 10.5 mg/dL    EKG Orders placed during the hospital encounter of 12/30/11  . AMB BP MONITORING  . AMB BP MONITORING     Prior Assessment and Plan Problem List as of 02/24/2012            Cardiology Problems   Hypertension, malignant   Last Assessment & Plan Note   01/22/2012 Office Visit Signed 01/22/2012  9:39 PM by Yehuda Savannah, MD    Effect of increased diuresis on hypertension will be assessed before we proceed with additional pharmacologic therapy.    Hyperlipidemia   Last Assessment & Plan Note   12/06/2011 Office Visit Signed 12/06/2011  8:09 PM by Yehuda Savannah, MD    It is unclear whether previous lipid measurements were obtained while patient treated with current statin.  A repeat lipid profile will be drawn.      Other   Diabetes mellitus type 1   Last Assessment & Plan Note   09/26/2011 Office Visit Signed 09/26/2011  1:40 PM by Yehuda Savannah, MD    Patient's diabetologist, Dr. [name], has been working closely with him to achieve improved diabetic control.  FBS in 07/2011 was 122 with a fructosamine of 238, which is "within the normal range".    Hypoglycemia   Chronic kidney disease, stage II (mild)   Last Assessment & Plan Note   01/22/2012 Office Visit Signed 01/22/2012  9:29 PM by Yehuda Savannah, MD    In the setting of chronic kidney disease, a loop diuretic may be necessary to achieve an optimal volume status.  Furosemide will be continued at a dose of 60 mg per day in addition to the patient's low-dose thiazide diuretic and renal function and electrolytes monitored.        Imaging: No results found.   FRS Calculation: Score not calculated. Missing: Total Cholesterol

## 2012-02-24 NOTE — Assessment & Plan Note (Addendum)
Remarkably little progress in control of hypertension despite multidrug regimen. Amlodipine will be increased to 10 mg per day, which may exacerbate peripheral edema. Aldactone will be added at a dose of 25 mg per day with close monitoring of electrolytes and renal function.

## 2012-02-24 NOTE — Assessment & Plan Note (Signed)
Marked hyperlipidemia with total/LDL cholesterol of 281/199. Pharmacologic therapy will be necessary, but will be deferred until hypertension is well-controlled.

## 2012-02-24 NOTE — Patient Instructions (Addendum)
Your physician recommends that you schedule a follow-up appointment in: 6 weeks  Your physician has recommended you make the following change in your medication:  1 - START Aldactone 25 mg daily 2 - INCREASE Norvasc (Amlodipine) to 10 mg   Your physician recommends that you return for lab work in: Today, 2 weeks and 4 weeks

## 2012-02-25 LAB — BASIC METABOLIC PANEL
CO2: 25 mEq/L (ref 19–32)
Calcium: 8.4 mg/dL (ref 8.4–10.5)
Chloride: 103 mEq/L (ref 96–112)
Glucose, Bld: 81 mg/dL (ref 70–99)
Sodium: 137 mEq/L (ref 135–145)

## 2012-02-25 LAB — LIPID PANEL
HDL: 80 mg/dL (ref >39)
Total CHOL/HDL Ratio: 5 Ratio

## 2012-02-27 ENCOUNTER — Encounter: Payer: Self-pay | Admitting: Cardiology

## 2012-03-03 ENCOUNTER — Other Ambulatory Visit: Payer: Self-pay | Admitting: *Deleted

## 2012-03-03 ENCOUNTER — Encounter: Payer: Self-pay | Admitting: *Deleted

## 2012-03-03 DIAGNOSIS — E782 Mixed hyperlipidemia: Secondary | ICD-10-CM

## 2012-03-03 DIAGNOSIS — I10 Essential (primary) hypertension: Secondary | ICD-10-CM

## 2012-03-03 MED ORDER — ATORVASTATIN CALCIUM 80 MG PO TABS: 80.0000 mg | ORAL_TABLET | Freq: Every day | ORAL | Status: DC

## 2012-03-04 ENCOUNTER — Other Ambulatory Visit: Payer: Self-pay | Admitting: *Deleted

## 2012-03-19 ENCOUNTER — Other Ambulatory Visit: Payer: Self-pay | Admitting: Cardiology

## 2012-03-28 ENCOUNTER — Other Ambulatory Visit: Payer: Self-pay | Admitting: Cardiology

## 2012-04-01 ENCOUNTER — Other Ambulatory Visit: Payer: Self-pay | Admitting: *Deleted

## 2012-04-01 DIAGNOSIS — I10 Essential (primary) hypertension: Secondary | ICD-10-CM

## 2012-04-01 DIAGNOSIS — E782 Mixed hyperlipidemia: Secondary | ICD-10-CM

## 2012-04-08 ENCOUNTER — Ambulatory Visit: Payer: BC Managed Care – PPO | Admitting: Cardiology

## 2012-04-10 ENCOUNTER — Ambulatory Visit (INDEPENDENT_AMBULATORY_CARE_PROVIDER_SITE_OTHER): Payer: BC Managed Care – PPO | Admitting: Cardiology

## 2012-04-10 ENCOUNTER — Encounter: Payer: Self-pay | Admitting: Cardiology

## 2012-04-10 ENCOUNTER — Encounter: Payer: Self-pay | Admitting: *Deleted

## 2012-04-10 VITALS — BP 160/84 | HR 85 | Ht 75.0 in | Wt 241.1 lb

## 2012-04-10 DIAGNOSIS — I10 Essential (primary) hypertension: Secondary | ICD-10-CM

## 2012-04-10 DIAGNOSIS — E785 Hyperlipidemia, unspecified: Secondary | ICD-10-CM

## 2012-04-10 NOTE — Progress Notes (Deleted)
Name: Lance [name]    DOB: [birth year]  Age: 42 y.o.  MR#: [id number]       PCP:  [name], MD      Insurance: Payor: BLUE CROSS BLUE SHIELD  Plan: BCBS PPO OUT OF STATE  Product Type: *No Product type*    CC:   No chief complaint on file.  MEDICATION BOTTLES  VS Filed Vitals:   04/10/12 1519  BP: 160/84  Pulse: 85  Height: 6\' 3"  (1.905 m)  Weight: 241 lb 1.9 oz (109.371 kg)  SpO2: 99%    Weights Current Weight  04/10/12 241 lb 1.9 oz (109.371 kg)  02/24/12 242 lb (109.77 kg)  02/10/12 242 lb (109.77 kg)    Blood Pressure  BP Readings from Last 3 Encounters:  04/10/12 160/84  02/24/12 168/98  02/10/12 182/99     Admit date:  (Not on file) Last encounter with RMR:  04/08/2012   Allergy Minoxidil  Current Outpatient Prescriptions  Medication Sig Dispense Refill  . amLODipine (NORVASC) 10 MG tablet Take 1 tablet (10 mg total) by mouth daily.  180 tablet  3  . aspirin EC 81 MG tablet Take 81 mg by mouth daily.      Marland Kitchen atorvastatin (LIPITOR) 80 MG tablet Take 1 tablet (80 mg total) by mouth daily.  30 tablet  6  . carvedilol (COREG) 25 MG tablet Take 2 tablets (50 mg total) by mouth 2 (two) times daily.  120 tablet  11  . cloNIDine (CATAPRES) 0.2 MG tablet Take 1 tablet (0.2 mg total) by mouth every evening.  30 tablet  11  . furosemide (LASIX) 80 MG tablet Take 80 mg by mouth daily.      . insulin lispro protamine-insulin lispro (HUMALOG 75/25) (75-25) 100 UNIT/ML SUSP Inject 58-80 Units into the skin daily. 80 -150  58 units 150-300  68 units 300-400  80 units       . insulin NPH-insulin regular (NOVOLIN 50/50) (50-50) 100 UNIT/ML injection Inject 30 Units into the skin daily before supper.        . losartan-hydrochlorothiazide (HYZAAR) 100-25 MG per tablet TAKE 1 TABLET BY MOUTH DAILY.  30 tablet  9  . spironolactone (ALDACTONE) 25 MG tablet Take 1 tablet (25 mg total) by mouth daily.  30 tablet  6  . terazosin (HYTRIN) 10 MG capsule Take 10 mg by mouth 2 (two)  times daily.       Marland Kitchen terazosin (HYTRIN) 10 MG capsule TAKE ONE CAPSULE TWICE A DAY  60 capsule  8  . verapamil (CALAN-SR) 240 MG CR tablet Take 1 tablet (240 mg total) by mouth at bedtime.  30 tablet  11   No current facility-administered medications for this visit.    Discontinued Meds:    Medications Discontinued During This Encounter  Medication Reason  . furosemide (LASIX) 80 MG tablet     Patient Active Problem List  Diagnosis  . Diabetes mellitus type 1  . Hypoglycemia  . Hypertension, malignant  . Hyperlipidemia  . Chronic kidney disease, stage II (mild)    LABS    Component Value Date/Time   NA 137 02/24/2012 1615   NA 137 12/31/2011 0800   NA 133* 09/27/2010 0436   K 3.9 02/24/2012 1615   K 4.6 12/31/2011 0800   K 4.3 09/27/2010 0436   CL 103 02/24/2012 1615   CL 106 12/31/2011 0800   CL 102 09/27/2010 0436   CO2 25 02/24/2012 1615   CO2 23  12/31/2011 0800   CO2 18* 09/27/2010 0436   GLUCOSE 81 02/24/2012 1615   GLUCOSE 201* 12/31/2011 0800   GLUCOSE 391* 09/27/2010 0821   BUN 39* 02/24/2012 1615   BUN 37* 12/31/2011 0800   BUN 27* 09/27/2010 0436   CREATININE 1.94* 02/24/2012 1615   CREATININE 1.92* 12/31/2011 0800   CREATININE 1.19 09/27/2010 0436   CREATININE 1.34 09/26/2010 0646   CALCIUM 8.4 02/24/2012 1615   CALCIUM 8.7 12/31/2011 0800   CALCIUM 9.1 09/27/2010 0436   GFRNONAA >60 09/27/2010 0436   GFRNONAA 59* 09/26/2010 0646   GFRAA >60 09/27/2010 0436   GFRAA >60 09/26/2010 0646   CMP     Component Value Date/Time   NA 137 02/24/2012 1615   K 3.9 02/24/2012 1615   CL 103 02/24/2012 1615   CO2 25 02/24/2012 1615   GLUCOSE 81 02/24/2012 1615   BUN 39* 02/24/2012 1615   CREATININE 1.94* 02/24/2012 1615   CREATININE 1.19 09/27/2010 0436   CALCIUM 8.4 02/24/2012 1615   PROT 5.9* 12/31/2011 0800   ALBUMIN 2.9* 12/31/2011 0800   AST 10 12/31/2011 0800   ALT 9 12/31/2011 0800   ALKPHOS 70 12/31/2011 0800   BILITOT 0.4 12/31/2011 0800   GFRNONAA >60 09/27/2010 0436    GFRAA >60 09/27/2010 0436       Component Value Date/Time   WBC 6.8 09/27/2010 0436   WBC 9.0 09/26/2010 0646   HGB 13.3 09/27/2010 0436   HGB 15.0 09/26/2010 0646   HCT 38.5* 09/27/2010 0436   HCT 42.6 09/26/2010 0646   MCV 87.3 09/27/2010 0436   MCV 86.4 09/26/2010 0646    Lipid Panel     Component Value Date/Time   CHOL 398* 02/24/2012 1615   TRIG 431* 02/24/2012 1615   HDL 80 02/24/2012 1615   CHOLHDL 5.0 02/24/2012 1615   VLDL NOT CALC 02/24/2012 1615   LDLCALC Comment:   Not calculated due to Triglyceride >400. Suggest ordering Direct LDL (Unit Code: [phone]).   Total Cholesterol/HDL Ratio:CHD Risk                        Coronary Heart Disease Risk Table                                        Men       Women          1/2 Average Risk              3.4        3.3              Average Risk              5.0        4.4           2X Average Risk              9.6        7.1           3X Average Risk             23.4       11.0 Use the calculated Patient Ratio above and the CHD Risk table  to determine the patient's CHD Risk. ATP III Classification (LDL):       < 100        mg/dL  Optimal      100 - 129     mg/dL         Near or Above Optimal      130 - 159     mg/dL         Borderline High      160 - 189     mg/dL         High       > 190        mg/dL         Very High   02/24/2012 1615    ABG No results found for this basename: phart, pco2, pco2art, po2, po2art, hco3, tco2, acidbasedef, o2sat     No results found for this basename: TSH   BNP (last 3 results) No results found for this basename: PROBNP,  in the last 8760 hours Cardiac Panel (last 3 results) No results found for this basename: CKTOTAL, CKMB, TROPONINI, RELINDX,  in the last 72 hours  Iron/TIBC/Ferritin No results found for this basename: iron, tibc, ferritin     EKG Orders placed during the hospital encounter of 12/30/11  . AMB BP MONITORING  . AMB BP MONITORING     Prior Assessment and Plan Problem List as of  04/10/2012     ICD-9-CM     Cardiology Problems   Hypertension, malignant   Last Assessment & Plan   02/24/2012 Office Visit Edited 03/03/2012 10:07 PM by Yehuda Savannah, MD     Remarkably little progress in control of hypertension despite multidrug regimen. Amlodipine will be increased to 10 mg per day, which may exacerbate peripheral edema. Aldactone will be added at a dose of 25 mg per day with close monitoring of electrolytes and renal function.    Hyperlipidemia   Last Assessment & Plan   02/24/2012 Office Visit Written 02/24/2012 10:11 PM by Yehuda Savannah, MD     Marked hyperlipidemia with total/LDL cholesterol of 281/199. Pharmacologic therapy will be necessary, but will be deferred until hypertension is well-controlled.      Other   Diabetes mellitus type 1   Last Assessment & Plan   09/26/2011 Office Visit Written 09/26/2011  1:40 PM by Yehuda Savannah, MD     Patient's diabetologist, Dr. [name], has been working closely with him to achieve improved diabetic control.  FBS in 07/2011 was 122 with a fructosamine of 238, which is "within the normal range".    Hypoglycemia   Chronic kidney disease, stage II (mild)   Last Assessment & Plan   02/24/2012 Office Visit Written 02/24/2012 10:10 PM by Yehuda Savannah, MD     In light of increased dose of furosemide, renal function and electrolytes will be reassessed.        Imaging: No results found.

## 2012-04-10 NOTE — Patient Instructions (Addendum)
Your physician recommends that you schedule a follow-up appointment in: 6 weeks  Your physician recommends that you return for lab work in: 1 month  Your physician has recommended you make the following change in your medication:  1 - STOP Atorvastatin for now  Bring BP log to next visit

## 2012-04-10 NOTE — Progress Notes (Signed)
Patient ID: Kedus Drouhard, male   DOB: 18-Oct-1970, 42 y.o.   MRN: [id number]  HPI: Schedule return visit for this very nice young gentleman with very severe hypertension. Since his last visit, he has developed a number of adverse effects likely related to his medication. He describes back pain radiating around the right flank which is atypical but otherwise diffuse arthralgias. He also was noted retrograde ejaculation and erectile dysfunction, which occurs a relatively small percentage of the time. Energy level and general functional status are good. Blood pressure values at home have reportedly been normal approximately 50% of the time with elevated values noted in the evening, but no very high measurements.  Current Outpatient Prescriptions  Medication Sig Dispense Refill  . amLODipine (NORVASC) 10 MG tablet Take 1 tablet (10 mg total) by mouth daily.  180 tablet  3  . aspirin EC 81 MG tablet Take 81 mg by mouth daily.      Marland Kitchen atorvastatin (LIPITOR) 80 MG tablet Take 1 tablet (80 mg total) by mouth daily.  30 tablet  6  . carvedilol (COREG) 25 MG tablet Take 2 tablets (50 mg total) by mouth 2 (two) times daily.  120 tablet  11  . cloNIDine (CATAPRES) 0.2 MG tablet Take 1 tablet (0.2 mg total) by mouth every evening.  30 tablet  11  . furosemide (LASIX) 80 MG tablet Take 80 mg by mouth daily.      . insulin lispro protamine-insulin lispro (HUMALOG 75/25) (75-25) 100 UNIT/ML SUSP Inject 58-80 Units into the skin daily. 80 -150  58 units 150-300  68 units 300-400  80 units       . insulin NPH-insulin regular (NOVOLIN 50/50) (50-50) 100 UNIT/ML injection Inject 30 Units into the skin daily before supper.        . losartan-hydrochlorothiazide (HYZAAR) 100-25 MG per tablet TAKE 1 TABLET BY MOUTH DAILY.  30 tablet  9  . spironolactone (ALDACTONE) 25 MG tablet Take 1 tablet (25 mg total) by mouth daily.  30 tablet  6  . terazosin (HYTRIN) 10 MG capsule Take 10 mg by mouth 2 (two) times daily.        Marland Kitchen terazosin (HYTRIN) 10 MG capsule TAKE ONE CAPSULE TWICE A DAY  60 capsule  8  . verapamil (CALAN-SR) 240 MG CR tablet Take 1 tablet (240 mg total) by mouth at bedtime.  30 tablet  11   No current facility-administered medications for this visit.   Allergies  Allergen Reactions  . Minoxidil Other (See Comments)    Fluid retention     Past medical history, social history, and family history reviewed and updated.  ROS: Denies chest pain, dyspnea, orthopnea, PND, palpitations, lightheadedness or syncope. No nocturia or declining urine output. All other systems reviewed and are negative.  PHYSICAL EXAM: BP 160/84  Pulse 85  Ht 6\' 3"  (1.905 m)  Wt 109.371 kg (241 lb 1.9 oz)  BMI 30.14 kg/m2  SpO2 99%;  Body mass index is 30.14 kg/(m^2). General-Well developed; no acute distress Body habitus-muscular, but mildly overweight Neck-No JVD; no carotid bruits Lungs-clear lung fields; resonant to percussion Cardiovascular-normal PMI; normal S1 and S2; S4 present Abdomen-normal bowel sounds; soft and non-tender without masses or organomegaly Musculoskeletal-No deformities, no cyanosis or clubbing Neurologic-Normal cranial nerves; symmetric strength and tone Extremities-distal pulses intact; 1/2+ ankle and pretibial edema with chronic stasis changes  [name], MD 04/10/2012  3:47 PM  ASSESSMENT AND PLAN

## 2012-04-11 NOTE — Assessment & Plan Note (Signed)
Moderately severe chronic kidney disease, likely secondary to severe hypertension, has been stable. Additional information provided regarding appropriate diet. Continued monitoring appropriate.

## 2012-04-11 NOTE — Assessment & Plan Note (Signed)
Blood pressure control is markedly improved, but remains suboptimal. Patient will continue to monitor and return with a list to his next visit. He will call to report substantial elevations, if any.

## 2012-04-11 NOTE — Assessment & Plan Note (Signed)
Patient describes discomfort in back, flank and other arthralgias that could represent an adverse effect of high-dose statin therapy. Atorvastatin will be discontinued for the time being and symptoms reassessed in one month.

## 2012-04-13 ENCOUNTER — Encounter: Payer: Self-pay | Admitting: *Deleted

## 2012-05-05 ENCOUNTER — Other Ambulatory Visit: Payer: Self-pay | Admitting: *Deleted

## 2012-05-05 DIAGNOSIS — I10 Essential (primary) hypertension: Secondary | ICD-10-CM

## 2012-05-14 ENCOUNTER — Encounter: Payer: Self-pay | Admitting: *Deleted

## 2012-05-25 ENCOUNTER — Encounter: Payer: Self-pay | Admitting: *Deleted

## 2012-05-25 ENCOUNTER — Ambulatory Visit (INDEPENDENT_AMBULATORY_CARE_PROVIDER_SITE_OTHER): Payer: BC Managed Care – PPO | Admitting: Cardiology

## 2012-05-25 ENCOUNTER — Encounter: Payer: Self-pay | Admitting: Cardiology

## 2012-05-25 VITALS — BP 142/90 | HR 90 | Ht 75.0 in | Wt 245.0 lb

## 2012-05-25 DIAGNOSIS — E782 Mixed hyperlipidemia: Secondary | ICD-10-CM

## 2012-05-25 DIAGNOSIS — E785 Hyperlipidemia, unspecified: Secondary | ICD-10-CM

## 2012-05-25 DIAGNOSIS — I10 Essential (primary) hypertension: Secondary | ICD-10-CM

## 2012-05-25 MED ORDER — PRAVASTATIN SODIUM 20 MG PO TABS: 20.0000 mg | ORAL_TABLET | Freq: Every evening | ORAL | Status: DC

## 2012-05-25 NOTE — Assessment & Plan Note (Signed)
Renal function has been stable in recent months; a repeat chemistry profile will be obtained.

## 2012-05-25 NOTE — Patient Instructions (Addendum)
Your physician recommends that you schedule a follow-up appointment in: 3 months  Your physician has requested that you regularly monitor and record your blood pressure readings at home. Please use the same machine at the same time of day to check your readings and record them to bring to your follow-up visit.  Your physician recommends that you return for lab work in: NOW  & in 2 months  Your physician has recommended you make the following change in your medication:  1 - START Pravastatin 20 mg daily

## 2012-05-25 NOTE — Assessment & Plan Note (Signed)
Pharmacologic therapy will be instituted with atorvastatin 40 mg per day. Lipid profile will be repeated in 2 months.

## 2012-05-25 NOTE — Assessment & Plan Note (Signed)
Blood pressure is amazingly good today, which once again raises the question of patient compliance. We will not attempt to restart Aldactone at a lower dose. Patient is asked to monitor blood pressures return a list at his next visit in 3 months.

## 2012-05-25 NOTE — Progress Notes (Deleted)
Name: Lance [name]    DOB: August 10, 1970  Age: 42 y.o.  MR#: [id number]       PCP:  [name], MD      Insurance: Payor: BLUE CROSS BLUE [employer]IELD  Plan: BCBS PPO OUT OF STATE  Product Type: *No Product type*    CC:   No chief complaint on file.  MEDICATION BOTTLES DID NOT HAVE LABS DRAWN  VS Filed Vitals:   05/25/12 1523  BP: 142/90  Pulse: 90  Height: 6\' 3"  (1.905 m)  Weight: 245 lb (111.131 kg)  SpO2: 98%    Weights Current Weight  05/25/12 245 lb (111.131 kg)  04/10/12 241 lb 1.9 oz (109.371 kg)  02/24/12 242 lb (109.77 kg)    Blood Pressure  BP Readings from Last 3 Encounters:  05/25/12 142/90  04/10/12 160/84  02/24/12 168/98     Admit date:  (Not on file) Last encounter with RMR:  04/10/2012   Allergy Minoxidil  Current Outpatient Prescriptions  Medication Sig Dispense Refill  . amLODipine (NORVASC) 10 MG tablet Take 1 tablet (10 mg total) by mouth daily.  180 tablet  3  . aspirin EC 81 MG tablet Take 81 mg by mouth daily.      . carvedilol (COREG) 25 MG tablet Take 2 tablets (50 mg total) by mouth 2 (two) times daily.  120 tablet  11  . cloNIDine (CATAPRES) 0.2 MG tablet Take 1 tablet (0.2 mg total) by mouth every evening.  30 tablet  11  . furosemide (LASIX) 80 MG tablet Take 80 mg by mouth daily.      . insulin lispro protamine-insulin lispro (HUMALOG 75/25) (75-25) 100 UNIT/ML SUSP Inject 58-80 Units into the skin daily. 80 -150  58 units 150-300  68 units 300-400  80 units       . insulin NPH-insulin regular (NOVOLIN 50/50) (50-50) 100 UNIT/ML injection Inject 30 Units into the skin daily before supper.        . losartan-hydrochlorothiazide (HYZAAR) 100-25 MG per tablet TAKE 1 TABLET BY MOUTH DAILY.  30 tablet  9  . terazosin (HYTRIN) 10 MG capsule TAKE ONE CAPSULE TWICE A DAY  60 capsule  8  . verapamil (CALAN-SR) 240 MG CR tablet Take 1 tablet (240 mg total) by mouth at bedtime.  30 tablet  11   No current facility-administered medications for this  visit.    Discontinued Meds:    Medications Discontinued During This Encounter  Medication Reason  . terazosin (HYTRIN) 10 MG capsule Duplicate  . spironolactone (ALDACTONE) 25 MG tablet Completed Course    Patient Active Problem List  Diagnosis  . Diabetes mellitus type 1  . Hypertension, malignant  . Hyperlipidemia-severe  . Chronic kidney disease, stage III (moderate)    LABS    Component Value Date/Time   NA 137 02/24/2012 1615   NA 137 12/31/2011 0800   NA 133* 09/27/2010 0436   K 3.9 02/24/2012 1615   K 4.6 12/31/2011 0800   K 4.3 09/27/2010 0436   CL 103 02/24/2012 1615   CL 106 12/31/2011 0800   CL 102 09/27/2010 0436   CO2 25 02/24/2012 1615   CO2 23 12/31/2011 0800   CO2 18* 09/27/2010 0436   GLUCOSE 81 02/24/2012 1615   GLUCOSE 201* 12/31/2011 0800   GLUCOSE 391* 09/27/2010 0821   BUN 39* 02/24/2012 1615   BUN 37* 12/31/2011 0800   BUN 27* 09/27/2010 0436   CREATININE 1.94* 02/24/2012 1615   CREATININE 1.92*  12/31/2011 0800   CREATININE 1.19 09/27/2010 0436   CREATININE 1.34 09/26/2010 0646   CALCIUM 8.4 02/24/2012 1615   CALCIUM 8.7 12/31/2011 0800   CALCIUM 9.1 09/27/2010 0436   GFRNONAA >60 09/27/2010 0436   GFRNONAA 59* 09/26/2010 0646   GFRAA >60 09/27/2010 0436   GFRAA >60 09/26/2010 0646   CMP     Component Value Date/Time   NA 137 02/24/2012 1615   K 3.9 02/24/2012 1615   CL 103 02/24/2012 1615   CO2 25 02/24/2012 1615   GLUCOSE 81 02/24/2012 1615   BUN 39* 02/24/2012 1615   CREATININE 1.94* 02/24/2012 1615   CREATININE 1.19 09/27/2010 0436   CALCIUM 8.4 02/24/2012 1615   PROT 5.9* 12/31/2011 0800   ALBUMIN 2.9* 12/31/2011 0800   AST 10 12/31/2011 0800   ALT 9 12/31/2011 0800   ALKPHOS 70 12/31/2011 0800   BILITOT 0.4 12/31/2011 0800   GFRNONAA >60 09/27/2010 0436   GFRAA >60 09/27/2010 0436       Component Value Date/Time   WBC 6.8 09/27/2010 0436   WBC 9.0 09/26/2010 0646   HGB 13.3 09/27/2010 0436   HGB 15.0 09/26/2010 0646   HCT 38.5* 09/27/2010 0436    HCT 42.6 09/26/2010 0646   MCV 87.3 09/27/2010 0436   MCV 86.4 09/26/2010 0646    Lipid Panel     Component Value Date/Time   CHOL 398* 02/24/2012 1615   TRIG 431* 02/24/2012 1615   HDL 80 02/24/2012 1615   CHOLHDL 5.0 02/24/2012 1615   VLDL NOT CALC 02/24/2012 1615   LDLCALC Comment:   Not calculated due to Triglyceride >400. Suggest ordering Direct LDL (Unit Code: [phone]).   Total Cholesterol/HDL Ratio:CHD Risk                        Coronary Heart Disease Risk Table                                        Men       Women          1/2 Average Risk              3.4        3.3              Average Risk              5.0        4.4           2X Average Risk              9.6        7.1           3X Average Risk             23.4       11.0 Use the calculated Patient Ratio above and the CHD Risk table  to determine the patient's CHD Risk. ATP III Classification (LDL):       < 100        mg/dL         Optimal      100 - 129     mg/dL         Near or Above Optimal      130 - 159     mg/dL  Borderline High      160 - 189     mg/dL         High       > 190        mg/dL         Very High   02/24/2012 1615    ABG No results found for this basename: phart, pco2, pco2art, po2, po2art, hco3, tco2, acidbasedef, o2sat     No results found for this basename: TSH   BNP (last 3 results) No results found for this basename: PROBNP,  in the last 8760 hours Cardiac Panel (last 3 results) No results found for this basename: CKTOTAL, CKMB, TROPONINI, RELINDX,  in the last 72 hours  Iron/TIBC/Ferritin No results found for this basename: iron, tibc, ferritin     EKG Orders placed during the hospital encounter of 12/30/11  . AMB BP MONITORING  . AMB BP MONITORING     Prior Assessment and Plan Problem List as of 05/25/2012     ICD-9-CM     Cardiology Problems   Hypertension, malignant   Last Assessment & Plan   04/10/2012 Office Visit Written 04/11/2012  3:09 PM by Yehuda Savannah, MD     Blood pressure  control is markedly improved, but remains suboptimal. Patient will continue to monitor and return with a list to his next visit. He will call to report substantial elevations, if any.    Hyperlipidemia-severe   Last Assessment & Plan   04/10/2012 Office Visit Written 04/11/2012  3:08 PM by Yehuda Savannah, MD     Patient describes discomfort in back, flank and other arthralgias that could represent an adverse effect of high-dose statin therapy. Atorvastatin will be discontinued for the time being and symptoms reassessed in one month.      Other   Diabetes mellitus type 1   Last Assessment & Plan   09/26/2011 Office Visit Written 09/26/2011  1:40 PM by Yehuda Savannah, MD     Patient's diabetologist, Dr. [name], has been working closely with him to achieve improved diabetic control.  FBS in 07/2011 was 122 with a fructosamine of 238, which is "within the normal range".    Chronic kidney disease, stage III (moderate)   Last Assessment & Plan   04/10/2012 Office Visit Written 04/11/2012  3:08 PM by Yehuda Savannah, MD     Moderately severe chronic kidney disease, likely secondary to severe hypertension, has been stable. Additional information provided regarding appropriate diet. Continued monitoring appropriate.        Imaging: No results found.

## 2012-05-25 NOTE — Progress Notes (Signed)
Patient ID: Lance [name], male   DOB: [birth year], 42 y.o.   MRN: [id number]  HPI: Schedule return visit for this young gentleman with severe hypertension and chronic kidney disease, likely related to blood pressure elevation. At his last visit, spironolactone was added to his medical regime; however, he discontinued this medication as the result of urinary frequency. Nonetheless, blood pressures have been improved when assessed of late.  Patient does not have a list, as his sphygmomanometer is no longer functional. He plans to purchase a new one.  Current Outpatient Prescriptions  Medication Sig Dispense Refill  . amLODipine (NORVASC) 10 MG tablet Take 1 tablet (10 mg total) by mouth daily.  180 tablet  3  . aspirin EC 81 MG tablet Take 81 mg by mouth daily.      . carvedilol (COREG) 25 MG tablet Take 2 tablets (50 mg total) by mouth 2 (two) times daily.  120 tablet  11  . cloNIDine (CATAPRES) 0.2 MG tablet Take 1 tablet (0.2 mg total) by mouth every evening.  30 tablet  11  . furosemide (LASIX) 80 MG tablet Take 80 mg by mouth daily.      . insulin lispro protamine-insulin lispro (HUMALOG 75/25) (75-25) 100 UNIT/ML SUSP Inject 58-80 Units into the skin daily. 80 -150  58 units 150-300  68 units 300-400  80 units       . insulin NPH-insulin regular (NOVOLIN 50/50) (50-50) 100 UNIT/ML injection Inject 30 Units into the skin daily before supper.        . losartan-hydrochlorothiazide (HYZAAR) 100-25 MG per tablet TAKE 1 TABLET BY MOUTH DAILY.  30 tablet  9  . terazosin (HYTRIN) 10 MG capsule TAKE ONE CAPSULE TWICE A DAY  60 capsule  8  . verapamil (CALAN-SR) 240 MG CR tablet Take 1 tablet (240 mg total) by mouth at bedtime.  30 tablet  11   No current facility-administered medications for this visit.   Allergies  Allergen Reactions  . Minoxidil Other (See Comments)    Fluid retention     Past medical history, social history, and family history reviewed and updated.  ROS: Denies chest  pain, dyspnea, orthopnea, PND, palpitations, lightheadedness or syncope. All other systems reviewed and are negative.  PHYSICAL EXAM: BP 142/90  Pulse 90  Ht 6\' 3"  (1.905 m)  Wt 111.131 kg (245 lb)  BMI 30.62 kg/m2  SpO2 98%;  Body mass index is 30.62 kg/(m^2). General-Well developed; no acute distress Body habitus-mildly overweight Neck-No JVD; no carotid bruits Lungs-clear lung fields; resonant to percussion Cardiovascular-normal PMI; normal S1 and S2; fourth heart sound present Abdomen-normal bowel sounds; soft and non-tender without masses or organomegaly Musculoskeletal-No deformities, no cyanosis or clubbing Neurologic-Normal cranial nerves; symmetric strength and tone Skin-Warm, no significant lesions Extremities-distal pulses intact; trace edema  [name], MD 05/25/2012  4:09 PM  ASSESSMENT AND PLAN

## 2012-06-24 ENCOUNTER — Encounter: Payer: Self-pay | Admitting: *Deleted

## 2012-07-22 ENCOUNTER — Encounter: Payer: Self-pay | Admitting: *Deleted

## 2012-10-26 ENCOUNTER — Encounter: Payer: Self-pay | Admitting: Adult Health

## 2012-10-26 ENCOUNTER — Ambulatory Visit (INDEPENDENT_AMBULATORY_CARE_PROVIDER_SITE_OTHER): Payer: BC Managed Care – PPO | Admitting: Adult Health

## 2012-10-26 VITALS — BP 192/100 | HR 90 | Ht 75.0 in | Wt 239.8 lb

## 2012-10-26 DIAGNOSIS — E785 Hyperlipidemia, unspecified: Secondary | ICD-10-CM

## 2012-10-26 DIAGNOSIS — I10 Essential (primary) hypertension: Secondary | ICD-10-CM

## 2012-10-26 NOTE — Assessment & Plan Note (Signed)
He continues to be hypertensive with discontinuation of hytrin, so much so that he is taken out of work until he is controlled. He does not tolerate BID dosing of clonidine. He states his BP is better first thing in the am, but rises sharply during the day. I will start him on BiDil TID. I have given him samples of this from our office. He is told that he may have a headache with this, and can take tylenol with this. He will be seen in 4 days for BP check and follow up next week for appt.   I have requested labs from Dr. Foye Spurling office for evaluation of results.

## 2012-10-26 NOTE — Progress Notes (Signed)
HPI: [name] is a 42 year old former patient of Dr. [name] we are following for ongoing assessment and management of severe hypertension with history of chronic kidney disease. His last seen by Dr. Lattie Haw in April of 2014 for ongoing management after addition of spironolactone was added to his medical regimen. The patient discontinued this spironolactone on his own secondary to urinary frequency. On last visit the patient's blood pressure was 142/90. No further medications were adjusted. He was noted that he was found to be hypercholesterolemic and as atorvastatin 40 mg was added to his medical regimen.    He has been taken out of work due to hypertension. He is having BP up as high as [id number] systolic, and diastolic [id number]. He has been seen by Dr. Jeanann Lewandowsky endocrinologist who has completed labs and has ordered a renal ultrasound. He has taken himself off of hytrin due to ED and stopped taking the statin due to myalgia's. He complains of generalized weakness but no headache, blurred vision or chest pain. Clonidine is only being taken at HS due to drowsiness during the day.       Allergies  Allergen Reactions  . Atorvastatin Other (See Comments)    Myalgias  . Minoxidil Other (See Comments)    Fluid retention    Current Outpatient Prescriptions  Medication Sig Dispense Refill  . amLODipine (NORVASC) 10 MG tablet Take 1 tablet (10 mg total) by mouth daily.  180 tablet  3  . aspirin EC 81 MG tablet Take 81 mg by mouth daily.      . carvedilol (COREG) 25 MG tablet Take 2 tablets (50 mg total) by mouth 2 (two) times daily.  120 tablet  11  . cloNIDine (CATAPRES) 0.2 MG tablet Take 1 tablet (0.2 mg total) by mouth every evening.  30 tablet  11  . furosemide (LASIX) 80 MG tablet Take 80 mg by mouth daily.      . insulin lispro protamine-insulin lispro (HUMALOG 75/25) (75-25) 100 UNIT/ML SUSP Inject 58-80 Units into the skin daily. 80 -150  58 units 150-300  68 units 300-400  80 units        . insulin NPH-insulin regular (NOVOLIN 50/50) (50-50) 100 UNIT/ML injection Inject 30 Units into the skin daily before supper.        . losartan-hydrochlorothiazide (HYZAAR) 100-25 MG per tablet TAKE 1 TABLET BY MOUTH DAILY.  30 tablet  9  . verapamil (CALAN-SR) 240 MG CR tablet Take 1 tablet (240 mg total) by mouth at bedtime.  30 tablet  11  . pravastatin (PRAVACHOL) 20 MG tablet Take 1 tablet (20 mg total) by mouth every evening.  30 tablet  6  . terazosin (HYTRIN) 10 MG capsule TAKE ONE CAPSULE TWICE A DAY  60 capsule  8   No current facility-administered medications for this visit.    Past Medical History  Diagnosis Date  . Diabetes mellitus 1990    no insulin; A1c of 6.8 in 2009  . 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 Hyperlipidemia 2000  . Aortic stenosis     Very mild in 05/2007  . PSVT (paroxysmal supraventricular tachycardia)     Nonsustained; asymptomatic; diagnosed by event recorder in 2006  . Degenerative joint disease     S/p left TKA  . Tobacco abuse, in remission     20 pack years; discontinued in 1985; bronchitic changes on chest x-ray and 2009  . CHF (congestive heart failure)     Normal EF  and 05/2007; attributed to Actos vs. diastolic dysfunction; chest x-ray and 2009- cardiomegaly and vascular redistribution    Past Surgical History  Procedure Laterality Date  . Total knee arthroplasty      Left  . Lumbar spine surgery      Multiple procedures    [id number] of systems complete and found to be negative unless listed above  PHYSICAL EXAM Ht 6\' 3"  (1.905 m)  Wt 239 lb 12 oz (108.75 kg)  BMI 29.97 kg/m2  General: Well developed, well nourished, in no acute distress Head: Eyes PERRLA, No xanthomas.   Normal cephalic and atramatic  Lungs: Clear bilaterally to auscultation and percussion. Heart: HRRR S1 S2, S4 murmur.  Pulses are 2+ & equal.            No carotid bruit. No JVD.  No abdominal bruits. No femoral bruits. Abdomen: Bowel sounds are positive,  abdomen soft and non-tender without masses or                  Hernia's noted. Msk:  Back normal, normal gait. Normal strength and tone for age. Extremities: No clubbing, cyanosis or edema.  DP +1 Neuro: Alert and oriented X 3. Psych:  Good affect, responds appropriately    ASSESSMENT AND PLAN

## 2012-10-26 NOTE — Patient Instructions (Addendum)
Your physician recommends that you schedule a follow-up appointment in: 1 week  Please come to our office Friday for BP Check.

## 2012-10-26 NOTE — Assessment & Plan Note (Signed)
Being worked up by Dr. [name] in [place].Renal Ultrasound is pending.

## 2012-10-26 NOTE — Assessment & Plan Note (Signed)
He states that he is unable to take statins. He is advised on low cholesterol diet. We may need to discuss bile acid sequestrant or addition of Zetia once labs have been reviewed from Dr. Anell Barr office.

## 2012-10-26 NOTE — Progress Notes (Signed)
Name: Lance [name]    DOB: 1970-07-01  Age: 42 y.o.  MR#: [id number]       PCP:  [name], MD      Insurance: Payor: [employer] / Plan: [place] / Product Type: *No Product type* /   CC:    Chief Complaint  Patient presents with  . Hypertension   Pt STOPPED TAKING HYTRIN PER AFFECTED HIM SEXUALLY AND STOPPED PRAVASTATIN A FEW MONTHS AGO PER PAIN, NOTED HE HAS TRIED SEVERAL OTHERS WITH THE SAME AFFECT, PT ADVISED HE WAS TAKEN OUT OF WORK Friday PER BP WAS 194/112, PT HAS BEEN TAKING BP AT HOME BUT HAS NOT WRITTEN THEM, NOTED 154/80 IN THE AM AND NOTED IN THE EVENING TO ELEVATE  VS Filed Vitals:   10/26/12 1530  BP: 192/100  Pulse: 90  Height: 6\' 3"  (1.905 m)  Weight: 239 lb 12 oz (108.75 kg)    Weights Current Weight  10/26/12 239 lb 12 oz (108.75 kg)  05/25/12 245 lb (111.131 kg)  04/10/12 241 lb 1.9 oz (109.371 kg)    Blood Pressure  BP Readings from Last 3 Encounters:  10/26/12 192/100  05/25/12 142/90  04/10/12 160/84     Admit date:  (Not on file) Last encounter with RMR:  Visit date not found   Allergy Atorvastatin and Minoxidil  Current Outpatient Prescriptions  Medication Sig Dispense Refill  . amLODipine (NORVASC) 10 MG tablet Take 1 tablet (10 mg total) by mouth daily.  180 tablet  3  . aspirin EC 81 MG tablet Take 81 mg by mouth daily.      . carvedilol (COREG) 25 MG tablet Take 2 tablets (50 mg total) by mouth 2 (two) times daily.  120 tablet  11  . cloNIDine (CATAPRES) 0.2 MG tablet Take 1 tablet (0.2 mg total) by mouth every evening.  30 tablet  11  . furosemide (LASIX) 80 MG tablet Take 80 mg by mouth daily.      . insulin lispro protamine-insulin lispro (HUMALOG 75/25) (75-25) 100 UNIT/ML SUSP Inject 58-80 Units into the skin daily. 80 -150  58 units 150-300  68 units 300-400  80 units       . insulin NPH-insulin regular (NOVOLIN 50/50) (50-50) 100 UNIT/ML injection Inject 30 Units into the skin daily before supper.         . losartan-hydrochlorothiazide (HYZAAR) 100-25 MG per tablet TAKE 1 TABLET BY MOUTH DAILY.  30 tablet  9  . verapamil (CALAN-SR) 240 MG CR tablet Take 1 tablet (240 mg total) by mouth at bedtime.  30 tablet  11  . pravastatin (PRAVACHOL) 20 MG tablet Take 1 tablet (20 mg total) by mouth every evening.  30 tablet  6  . terazosin (HYTRIN) 10 MG capsule TAKE ONE CAPSULE TWICE A DAY  60 capsule  8   No current facility-administered medications for this visit.    Discontinued Meds:   There are no discontinued medications.  Patient Active Problem List   Diagnosis Date Noted  . Chronic kidney disease, stage III (moderate) 09/29/2011  . Diabetes mellitus type 1 09/26/2010  . Hypertension, malignant 09/26/2010  . Hyperlipidemia-severe 09/26/2010    LABS    Component Value Date/Time   NA 137 02/24/2012 1615   NA 137 12/31/2011 0800   NA 133* 09/27/2010 0436   K 3.9 02/24/2012 1615   K 4.6 12/31/2011 0800   K 4.3 09/27/2010 0436   CL 103 02/24/2012 1615  CL 106 12/31/2011 0800   CL 102 09/27/2010 0436   CO2 25 02/24/2012 1615   CO2 23 12/31/2011 0800   CO2 18* 09/27/2010 0436   GLUCOSE 81 02/24/2012 1615   GLUCOSE 201* 12/31/2011 0800   GLUCOSE 391* 09/27/2010 0821   BUN 39* 02/24/2012 1615   BUN 37* 12/31/2011 0800   BUN 27* 09/27/2010 0436   CREATININE 1.94* 02/24/2012 1615   CREATININE 1.92* 12/31/2011 0800   CREATININE 1.19 09/27/2010 0436   CREATININE 1.34 09/26/2010 0646   CALCIUM 8.4 02/24/2012 1615   CALCIUM 8.7 12/31/2011 0800   CALCIUM 9.1 09/27/2010 0436   GFRNONAA >60 09/27/2010 0436   GFRNONAA 59* 09/26/2010 0646   GFRAA >60 09/27/2010 0436   GFRAA >60 09/26/2010 0646   CMP     Component Value Date/Time   NA 137 02/24/2012 1615   K 3.9 02/24/2012 1615   CL 103 02/24/2012 1615   CO2 25 02/24/2012 1615   GLUCOSE 81 02/24/2012 1615   BUN 39* 02/24/2012 1615   CREATININE 1.94* 02/24/2012 1615   CREATININE 1.19 09/27/2010 0436   CALCIUM 8.4 02/24/2012 1615   PROT 5.9* 12/31/2011  0800   ALBUMIN 2.9* 12/31/2011 0800   AST 10 12/31/2011 0800   ALT 9 12/31/2011 0800   ALKPHOS 70 12/31/2011 0800   BILITOT 0.4 12/31/2011 0800   GFRNONAA >60 09/27/2010 0436   GFRAA >60 09/27/2010 0436       Component Value Date/Time   WBC 6.8 09/27/2010 0436   WBC 9.0 09/26/2010 0646   HGB 13.3 09/27/2010 0436   HGB 15.0 09/26/2010 0646   HCT 38.5* 09/27/2010 0436   HCT 42.6 09/26/2010 0646   MCV 87.3 09/27/2010 0436   MCV 86.4 09/26/2010 0646    Lipid Panel     Component Value Date/Time   CHOL 398* 02/24/2012 1615   TRIG 431* 02/24/2012 1615   HDL 80 02/24/2012 1615   CHOLHDL 5.0 02/24/2012 1615   VLDL NOT CALC 02/24/2012 1615   LDLCALC Comment:   Not calculated due to Triglyceride >400. Suggest ordering Direct LDL (Unit Code: [phone]).   Total Cholesterol/HDL Ratio:CHD Risk                        Coronary Heart Disease Risk Table                                        Men       Women          1/2 Average Risk              3.4        3.3              Average Risk              5.0        4.4           2X Average Risk              9.6        7.1           3X Average Risk             23.4       11.0 Use the calculated Patient Ratio above and the CHD Risk table  to determine the patient's CHD Risk.  ATP III Classification (LDL):       < 100        mg/dL         Optimal      100 - 129     mg/dL         Near or Above Optimal      130 - 159     mg/dL         Borderline High      160 - 189     mg/dL         High       > 190        mg/dL         Very High   02/24/2012 1615    ABG No results found for this basename: phart, pco2, pco2art, po2, po2art, hco3, tco2, acidbasedef, o2sat     No results found for this basename: TSH   BNP (last 3 results) No results found for this basename: PROBNP,  in the last 8760 hours Cardiac Panel (last 3 results) No results found for this basename: CKTOTAL, CKMB, TROPONINI, RELINDX,  in the last 72 hours  Iron/TIBC/Ferritin No results found for this basename: iron,  tibc, ferritin     EKG Orders placed in visit on 10/26/12  . EKG 12-LEAD     Prior Assessment and Plan Problem List as of 10/26/2012   Diabetes mellitus type 1   Last Assessment & Plan   09/26/2011 Office Visit Written 09/26/2011  1:40 PM by Yehuda Savannah, MD     Patient's diabetologist, Dr. [name], has been working closely with him to achieve improved diabetic control.  FBS in 07/2011 was 122 with a fructosamine of 238, which is "within the normal range".    Hypertension, malignant   Last Assessment & Plan   05/25/2012 Office Visit Written 05/25/2012  4:15 PM by Yehuda Savannah, MD     Blood pressure is amazingly good today, which once again raises the question of patient compliance. We will not attempt to restart Aldactone at a lower dose. Patient is asked to monitor blood pressures return a list at his next visit in 3 months.    Hyperlipidemia-severe   Last Assessment & Plan   05/25/2012 Office Visit Written 05/25/2012  4:14 PM by Yehuda Savannah, MD     Pharmacologic therapy will be instituted with atorvastatin 40 mg per day. Lipid profile will be repeated in 2 months.    Chronic kidney disease, stage III (moderate)   Last Assessment & Plan   05/25/2012 Office Visit Written 05/25/2012  4:13 PM by Yehuda Savannah, MD     Renal function has been stable in recent months; a repeat chemistry profile will be obtained.        Imaging: No results found.

## 2012-10-29 ENCOUNTER — Encounter: Payer: Self-pay | Admitting: Adult Health

## 2012-10-30 ENCOUNTER — Encounter: Payer: Self-pay | Admitting: Adult Health

## 2012-10-30 ENCOUNTER — Ambulatory Visit (INDEPENDENT_AMBULATORY_CARE_PROVIDER_SITE_OTHER): Payer: BC Managed Care – PPO | Admitting: *Deleted

## 2012-10-30 VITALS — BP 108/66 | HR 78 | Ht 75.0 in | Wt 242.0 lb

## 2012-10-30 DIAGNOSIS — I10 Essential (primary) hypertension: Secondary | ICD-10-CM

## 2012-10-30 NOTE — Progress Notes (Signed)
.  left message to have patient return my call.  

## 2012-10-30 NOTE — Progress Notes (Signed)
Pt presented to nurse visit today PER MEDICATION CHANGES NOTED AS FOLLOWS BiDil TID. Pt voiced no complaints todaY Please advise if any further instructions are neccessary   Joelene Millin A. Jerlyn Pain L.P.N.  Last OV D/C Summary and VS:  192/100 90 6\' 3"  (1.905 m) 239 lb 12 oz (108.75 kg) 29.97 kg/m2        Your physician recommends that you schedule a follow-up appointment in: 1 week  Please come to our office Friday for BP Check.

## 2012-10-30 NOTE — Progress Notes (Signed)
Reviewed home BP readings brought by the patient for 10/28/2012 and 10/30/2012. BP readings have improved significantly with use of BiDil TID. Range from 106/57 to 146/86. Plan for sleep study to evaluate for OSA.

## 2012-10-30 NOTE — Progress Notes (Signed)
Continue medications as directed. Avoid salt. Will follow up in the office.

## 2012-10-30 NOTE — Progress Notes (Signed)
I would like to schedule him for a sleep study to evaluate for OSA contributing to his ongoing hypertension. Please make arrangements for this.

## 2012-10-30 NOTE — Patient Instructions (Addendum)
Your physician recommends that you schedule a follow-up appointment in: TO BE DETERMINED   

## 2012-11-02 ENCOUNTER — Encounter: Payer: Self-pay | Admitting: Adult Health

## 2012-11-02 ENCOUNTER — Encounter: Payer: Self-pay | Admitting: *Deleted

## 2012-11-02 ENCOUNTER — Ambulatory Visit (INDEPENDENT_AMBULATORY_CARE_PROVIDER_SITE_OTHER): Payer: BC Managed Care – PPO | Admitting: Adult Health

## 2012-11-02 VITALS — BP 170/91 | HR 94 | Ht 75.0 in | Wt 236.0 lb

## 2012-11-02 DIAGNOSIS — I10 Essential (primary) hypertension: Secondary | ICD-10-CM

## 2012-11-02 DIAGNOSIS — G4730 Sleep apnea, unspecified: Secondary | ICD-10-CM

## 2012-11-02 DIAGNOSIS — I129 Hypertensive chronic kidney disease with stage 1 through stage 4 chronic kidney disease, or unspecified chronic kidney disease: Secondary | ICD-10-CM

## 2012-11-02 MED ORDER — ISOSORB DINITRATE-HYDRALAZINE 20-37.5 MG PO TABS: 1.0000 | ORAL_TABLET | Freq: Three times a day (TID) | ORAL | Status: DC

## 2012-11-02 MED ORDER — ISOSORB DINITRATE-HYDRALAZINE 20-37.5 MG PO TABS: 1.0000 | ORAL_TABLET | Freq: Two times a day (BID) | ORAL | Status: DC

## 2012-11-02 NOTE — Assessment & Plan Note (Signed)
I will continue the BiDil BID dosing. He will have a sleep study completed to ascertain OSA as etiology of difficult to control hypertension. I will not make any changes in his medication regimen at this time.  He will follow up with Dr. Harl Bowie or Bronson Ing in one month for discussion of results and ongoing hypertensive treatment.

## 2012-11-02 NOTE — Assessment & Plan Note (Signed)
Renal artery ultrasound is planned but not yet completed.

## 2012-11-02 NOTE — Patient Instructions (Addendum)
Your physician recommends that you schedule a follow-up appointment in: 1 month with Dr. Bronson Ing   Your physician has recommended you make the following change in your medication:  1. Take Bidil 1 tablet twice daily  Your physician has recommended that you have a sleep study. This test records several body functions during sleep, including: brain activity, eye movement, oxygen and carbon dioxide blood levels, heart rate and rhythm, breathing rate and rhythm, the flow of air through your mouth and nose, snoring, body muscle movements, and chest and belly movement.

## 2012-11-02 NOTE — Progress Notes (Signed)
HPI: [name] is a 42 y/o patient formerly of Dr.Rothabart we are following for ongoing assessment and treatment of malignant hypertension. On last visit he remained very elevated. He is on 5 separate antihypertensives. He is scheduled for a renal artery ultrasound. On last visit he was given samples of BiDil to take TID with follow up BP in one week. BP follow up revealed that he is hypotensive in the am, but by noon he is very hypertensive. BiDil brought BP down to normal range in the am and pm. He comes today feeling some better, but has stopped taking his am of dose of BiDil due to hypotension (0000000 systolic). Once he stopped the am dose, BP remained in normal range per records that he has brought with him today.  Allergies  Allergen Reactions  . Atorvastatin Other (See Comments)    Myalgias  . Minoxidil Other (See Comments)    Fluid retention    Current Outpatient Prescriptions  Medication Sig Dispense Refill  . amLODipine (NORVASC) 10 MG tablet Take 1 tablet (10 mg total) by mouth daily.  180 tablet  3  . aspirin EC 81 MG tablet Take 81 mg by mouth daily.      . furosemide (LASIX) 80 MG tablet Take 80 mg by mouth daily.      . insulin lispro protamine-insulin lispro (HUMALOG 75/25) (75-25) 100 UNIT/ML SUSP Inject 58-80 Units into the skin daily. 80 -150  58 units 150-300  68 units 300-400  80 units       . insulin NPH-insulin regular (NOVOLIN 50/50) (50-50) 100 UNIT/ML injection Inject 30 Units into the skin daily before supper.        . isosorbide-hydrALAZINE (BIDIL) 20-37.5 MG per tablet Take 1 tablet by mouth 3 (three) times daily.  90 tablet  3  . losartan-hydrochlorothiazide (HYZAAR) 100-25 MG per tablet TAKE 1 TABLET BY MOUTH DAILY.  30 tablet  9  . pravastatin (PRAVACHOL) 20 MG tablet Take 1 tablet (20 mg total) by mouth every evening.  30 tablet  6  . carvedilol (COREG) 25 MG tablet Take 2 tablets (50 mg total) by mouth 2 (two) times daily.  120 tablet  11  . cloNIDine  (CATAPRES) 0.2 MG tablet Take 1 tablet (0.2 mg total) by mouth every evening.  30 tablet  11   No current facility-administered medications for this visit.    Past Medical History  Diagnosis Date  . Diabetes mellitus 1990    no insulin; A1c of 6.8 in 2009  . 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 Hyperlipidemia 2000  . Aortic stenosis     Very mild in 05/2007  . PSVT (paroxysmal supraventricular tachycardia)     Nonsustained; asymptomatic; diagnosed by event recorder in 2006  . Degenerative joint disease     S/p left TKA  . Tobacco abuse, in remission     20 pack years; discontinued in 1985; bronchitic changes on chest x-ray and 2009  . CHF (congestive heart failure)     Normal EF and 05/2007; attributed to Actos vs. diastolic dysfunction; chest x-ray and 2009- cardiomegaly and vascular redistribution    Past Surgical History  Procedure Laterality Date  . Total knee arthroplasty      Left  . Lumbar spine surgery      Multiple procedures    [id number] of systems complete and found to be negative unless listed above  PHYSICAL EXAM BP 170/91  Pulse 94  Ht 6\' 3"  (1.905 m)  Wt 236 lb (107.049 kg)  BMI 29.5 kg/m2  General: Well developed, well nourished, in no acute distress Head: Eyes PERRLA, No xanthomas.   Normal cephalic and atramatic  Lungs: Clear bilaterally to auscultation and percussion. Heart: HRRR S1 S2, without MRG.  Pulses are 2+ & equal.            No carotid bruit. No JVD.  No abdominal bruits. No femoral bruits. Abdomen: Bowel sounds are positive, abdomen soft and non-tender without masses or                  Hernia's noted. Msk:  Back normal, normal gait. Normal strength and tone for age. Extremities: No clubbing, cyanosis or edema.  DP +1 Neuro: Alert and oriented X 3. Psych:  Good affect, responds appropriately    ASSESSMENT AND PLAN

## 2012-11-02 NOTE — Progress Notes (Deleted)
Name: Lance [name]    DOB: 05-04-1970  Age: 42 y.o.  MR#: [id number]       PCP:  [name], MD      Insurance: Payor: [place] / Plan: [place] / Product Type: *No Product type* /   CC:    Chief Complaint  Patient presents with  . Hypertension    VS Filed Vitals:   11/02/12 1503  BP: 170/91  Pulse: 94  Height: 6\' 3"  (1.905 m)  Weight: 236 lb (107.049 kg)    Weights Current Weight  11/02/12 236 lb (107.049 kg)  10/30/12 242 lb (109.77 kg)  10/26/12 239 lb 12 oz (108.75 kg)    Blood Pressure  BP Readings from Last 3 Encounters:  11/02/12 170/91  10/30/12 108/66  10/26/12 192/100     Admit date:  (Not on file) Last encounter with RMR:  10/26/2012   Allergy Atorvastatin and Minoxidil  Current Outpatient Prescriptions  Medication Sig Dispense Refill  . amLODipine (NORVASC) 10 MG tablet Take 1 tablet (10 mg total) by mouth daily.  180 tablet  3  . aspirin EC 81 MG tablet Take 81 mg by mouth daily.      . furosemide (LASIX) 80 MG tablet Take 80 mg by mouth daily.      . insulin lispro protamine-insulin lispro (HUMALOG 75/25) (75-25) 100 UNIT/ML SUSP Inject 58-80 Units into the skin daily. 80 -150  58 units 150-300  68 units 300-400  80 units       . insulin NPH-insulin regular (NOVOLIN 50/50) (50-50) 100 UNIT/ML injection Inject 30 Units into the skin daily before supper.        . losartan-hydrochlorothiazide (HYZAAR) 100-25 MG per tablet TAKE 1 TABLET BY MOUTH DAILY.  30 tablet  9  . pravastatin (PRAVACHOL) 20 MG tablet Take 1 tablet (20 mg total) by mouth every evening.  30 tablet  6  . carvedilol (COREG) 25 MG tablet Take 2 tablets (50 mg total) by mouth 2 (two) times daily.  120 tablet  11  . cloNIDine (CATAPRES) 0.2 MG tablet Take 1 tablet (0.2 mg total) by mouth every evening.  30 tablet  11   No current facility-administered medications for this visit.    Discontinued Meds:   There are no discontinued medications.  Patient  Active Problem List   Diagnosis Date Noted  . Chronic kidney disease, stage III (moderate) 09/29/2011  . Diabetes mellitus type 1 09/26/2010  . Hypertension, malignant 09/26/2010  . Hyperlipidemia-severe 09/26/2010    LABS    Component Value Date/Time   NA 137 02/24/2012 1615   NA 137 12/31/2011 0800   NA 133* 09/27/2010 0436   K 3.9 02/24/2012 1615   K 4.6 12/31/2011 0800   K 4.3 09/27/2010 0436   CL 103 02/24/2012 1615   CL 106 12/31/2011 0800   CL 102 09/27/2010 0436   CO2 25 02/24/2012 1615   CO2 23 12/31/2011 0800   CO2 18* 09/27/2010 0436   GLUCOSE 81 02/24/2012 1615   GLUCOSE 201* 12/31/2011 0800   GLUCOSE 391* 09/27/2010 0821   BUN 39* 02/24/2012 1615   BUN 37* 12/31/2011 0800   BUN 27* 09/27/2010 0436   CREATININE 1.94* 02/24/2012 1615   CREATININE 1.92* 12/31/2011 0800   CREATININE 1.19 09/27/2010 0436   CREATININE 1.34 09/26/2010 0646   CALCIUM 8.4 02/24/2012 1615   CALCIUM 8.7 12/31/2011 0800   CALCIUM 9.1 09/27/2010 0436   GFRNONAA >60 09/27/2010  0436   GFRNONAA 59* 09/26/2010 0646   GFRAA >60 09/27/2010 0436   GFRAA >60 09/26/2010 0646   CMP     Component Value Date/Time   NA 137 02/24/2012 1615   K 3.9 02/24/2012 1615   CL 103 02/24/2012 1615   CO2 25 02/24/2012 1615   GLUCOSE 81 02/24/2012 1615   BUN 39* 02/24/2012 1615   CREATININE 1.94* 02/24/2012 1615   CREATININE 1.19 09/27/2010 0436   CALCIUM 8.4 02/24/2012 1615   PROT 5.9* 12/31/2011 0800   ALBUMIN 2.9* 12/31/2011 0800   AST 10 12/31/2011 0800   ALT 9 12/31/2011 0800   ALKPHOS 70 12/31/2011 0800   BILITOT 0.4 12/31/2011 0800   GFRNONAA >60 09/27/2010 0436   GFRAA >60 09/27/2010 0436       Component Value Date/Time   WBC 6.8 09/27/2010 0436   WBC 9.0 09/26/2010 0646   HGB 13.3 09/27/2010 0436   HGB 15.0 09/26/2010 0646   HCT 38.5* 09/27/2010 0436   HCT 42.6 09/26/2010 0646   MCV 87.3 09/27/2010 0436   MCV 86.4 09/26/2010 0646    Lipid Panel     Component Value Date/Time   CHOL 398* 02/24/2012 1615   TRIG 431*  02/24/2012 1615   HDL 80 02/24/2012 1615   CHOLHDL 5.0 02/24/2012 1615   VLDL NOT CALC 02/24/2012 1615   LDLCALC Comment:   Not calculated due to Triglyceride >400. Suggest ordering Direct LDL (Unit Code: [id number]).   Total Cholesterol/HDL Ratio:CHD Risk                        Coronary Heart Disease Risk Table                                        Men       Women          1/2 Average Risk              3.4        3.3              Average Risk              5.0        4.4           2X Average Risk              9.6        7.1           3X Average Risk             23.4       11.0 Use the calculated Patient Ratio above and the CHD Risk table  to determine the patient's CHD Risk. ATP III Classification (LDL):       < 100        mg/dL         Optimal      100 - 129     mg/dL         Near or Above Optimal      130 - 159     mg/dL         Borderline High      160 - 189     mg/dL         High       > 190  mg/dL         Very High   02/24/2012 1615    ABG No results found for this basename: phart, pco2, pco2art, po2, po2art, hco3, tco2, acidbasedef, o2sat     No results found for this basename: TSH   BNP (last 3 results) No results found for this basename: PROBNP,  in the last 8760 hours Cardiac Panel (last 3 results) No results found for this basename: CKTOTAL, CKMB, TROPONINI, RELINDX,  in the last 72 hours  Iron/TIBC/Ferritin No results found for this basename: iron, tibc, ferritin     EKG Orders placed in visit on 10/26/12  . EKG 12-LEAD     Prior Assessment and Plan Problem List as of 11/02/2012     Cardiovascular and Mediastinum   Hypertension, malignant   Last Assessment & Plan   10/26/2012 Office Visit Written 10/26/2012  4:09 PM by Lendon Colonel, NP     He continues to be hypertensive with discontinuation of hytrin, so much so that he is taken out of work until he is controlled. He does not tolerate BID dosing of clonidine. He states his BP is better first thing in the am, but rises  sharply during the day. I will start him on BiDil TID. I have given him samples of this from our office. He is told that he may have a headache with this, and can take tylenol with this. He will be seen in 4 days for BP check and follow up next week for appt.   I have requested labs from Dr. Foye Spurling office for evaluation of results.       Endocrine   Diabetes mellitus type 1   Last Assessment & Plan   09/26/2011 Office Visit Written 09/26/2011  1:40 PM by Yehuda Savannah, MD     Patient's diabetologist, Dr. [name], has been working closely with him to achieve improved diabetic control.  FBS in 07/2011 was 122 with a fructosamine of 238, which is "within the normal range".      Genitourinary   Chronic kidney disease, stage III (moderate)   Last Assessment & Plan   10/26/2012 Office Visit Written 10/26/2012  4:11 PM by Lendon Colonel, NP     Being worked up by Dr. [name] in [place].Renal Ultrasound is pending.      Other   Hyperlipidemia-severe   Last Assessment & Plan   10/26/2012 Office Visit Written 10/26/2012  4:11 PM by Lendon Colonel, NP     He states that he is unable to take statins. He is advised on low cholesterol diet. We may need to discuss bile acid sequestrant or addition of Zetia once labs have been reviewed from Dr. Anell Barr office.         Imaging: No results found.

## 2012-11-02 NOTE — Progress Notes (Signed)
Noted KL NP discussed instructions with pt during office visit today 11-02-12

## 2012-11-16 ENCOUNTER — Ambulatory Visit: Payer: BC Managed Care – PPO | Attending: Adult Health | Admitting: Sleep Medicine

## 2012-11-16 DIAGNOSIS — G4710 Hypersomnia, unspecified: Secondary | ICD-10-CM | POA: Insufficient documentation

## 2012-11-16 DIAGNOSIS — G4730 Sleep apnea, unspecified: Secondary | ICD-10-CM

## 2012-11-21 ENCOUNTER — Other Ambulatory Visit: Payer: Self-pay | Admitting: Cardiology

## 2012-11-23 NOTE — Procedures (Signed)
[place] A. Merlene Laughter, MD     [email or web address]        NAME:  ARNO, TUAZON NO.:  [id number]  MEDICAL RECORD NO.:  [id number]          PATIENT TYPE:  OUT  LOCATION:  SLEEP LAB                     FACILITY:  [facility]  PHYSICIAN:  Lillian Ballester A. Merlene Laughter, M.D. DATE OF BIRTH:  [birth year]  DATE OF STUDY:                           NOCTURNAL POLYSOMNOGRAM  REFERRING PHYSICIAN:  Phill Myron. [place] Nails, NP  INDICATIONS:  A 42 year old who presents with hypersomnia, snoring and fatigue.   MEDICATIONS:  Aspirin, Hyzaar, pravastatin, Norvasc, Catapres, insulin, nitroglycerin, furosemide, Coreg.  EPWORTH SLEEPINESS SCALE:  14.  BMI:  29.  ARCHITECTURAL SUMMARY:  This is a full night recording.  The total recording time is 420 minutes, sleep efficiency 68%.  Sleep latency 90 minutes, REM latency 100 minutes.  Stage N1 7%, N2 62%, and N3 14%, and REM sleep 17%  RESPIRATORY SUMMARY:  Baseline oxygen saturation is 98, lowest saturation 93.  Diagnostic AHI 4 and RDI 5.  LIMB MOVEMENT SUMMARY:  PLM index 0.  ELECTROCARDIOGRAM SUMMARY:  Average heart rate is 85 with no significant dysrhythmias observed.  IMPRESSION:  Unremarkable nocturnal polysomnography.  Thanks for this referral.      Naseem Adler A. Merlene Laughter, M.D.    KAD/MEDQ  D:  11/23/2012 09:23:38  T:  11/23/2012 09:36:04  Job:  [id number]

## 2012-12-03 ENCOUNTER — Encounter: Payer: Self-pay | Admitting: Adult Health

## 2012-12-03 ENCOUNTER — Ambulatory Visit (INDEPENDENT_AMBULATORY_CARE_PROVIDER_SITE_OTHER): Payer: BC Managed Care – PPO | Admitting: Adult Health

## 2012-12-03 VITALS — BP 112/64 | HR 84 | Ht 75.0 in | Wt 234.0 lb

## 2012-12-03 DIAGNOSIS — E109 Type 1 diabetes mellitus without complications: Secondary | ICD-10-CM

## 2012-12-03 DIAGNOSIS — I10 Essential (primary) hypertension: Secondary | ICD-10-CM

## 2012-12-03 MED ORDER — HYDRALAZINE HCL 50 MG PO TABS: 50.0000 mg | ORAL_TABLET | Freq: Two times a day (BID) | ORAL | Status: DC

## 2012-12-03 MED ORDER — VERAPAMIL HCL 240 MG (CO) PO TB24: 240.0000 mg | ORAL_TABLET | Freq: Every day | ORAL | Status: DC

## 2012-12-03 NOTE — Assessment & Plan Note (Addendum)
The patient is on multiple medications for control. He is now tolerating BiDil she feels lightheaded and dizzy on this medication. He is negative for OSA. He is on both amlodipine, and verapamil, iodine, start and HCTZ, and carvedilol. Blood pressure log has blood pressures running in the 160s over 90s in the evening, and in the 110 in the am.  I will take him off the nitrate portion of BiDil but continue hydralazine at lower dose at 50 mg twice a day. May need to decrease dose of at bedtime verapamil for early morning hypotension. I will check a BMET in a week to evaluate kidney function. They need to consider ultrasound of renal artery to evaluate for renal artery stenosis.    It has been noted in the overview, that the patient was sent to Dr. Sabino Snipes at the [facility] clinic for consideration of RFA, but was occluded in the setting of type 1 diabetes.

## 2012-12-03 NOTE — Assessment & Plan Note (Signed)
Followup with primary care for continued management.

## 2012-12-03 NOTE — Assessment & Plan Note (Signed)
Followup BMET will be completed.

## 2012-12-03 NOTE — Progress Notes (Signed)
HPI: [name] is a former patient of Dr. Lattie Haw is now being followed by Dr. Domenic Polite that we are seeing today for ongoing assessment and management of malignant hypertension. He was placed on by BiDil3 times a day. He did stop taking his a.m. dose as this was causing him to be hypotensive in the a.m. and 94 and 96 systolic. He remained in normal range with noon and evening dose of BiDil. He was sent for sleep study to evaluate for obstructive sleep apnea in the setting of difficult to control hypertension. This was completed on 10 14 2014 and was negative for obstructive sleep apnea.  He complains of hypotension. He brings with him, a copy of his blood pressure checks over the last 2 weeks. He states in the morning he is very lightheaded and dizzy, with blood pressure low in the 1 teens. He has associated blurred vision. In the evening prior to bed his blood pressure is higher.   Allergies  Allergen Reactions  . Atorvastatin Other (See Comments)    Myalgias  . Minoxidil Other (See Comments)    Fluid retention    Current Outpatient Prescriptions  Medication Sig Dispense Refill  . amLODipine (NORVASC) 10 MG tablet Take 1 tablet (10 mg total) by mouth daily.  180 tablet  3  . aspirin EC 81 MG tablet Take 81 mg by mouth daily.      . cloNIDine (CATAPRES) 0.2 MG tablet TAKE 1 TABLET IN THE EVENING  30 tablet  6  . furosemide (LASIX) 80 MG tablet Take 80 mg by mouth daily.      . insulin lispro protamine-insulin lispro (HUMALOG 75/25) (75-25) 100 UNIT/ML SUSP Inject 58-80 Units into the skin daily. 80 -150  58 units 150-300  68 units 300-400  80 units       . insulin NPH-insulin regular (NOVOLIN 50/50) (50-50) 100 UNIT/ML injection Inject 30 Units into the skin daily before supper.        . losartan-hydrochlorothiazide (HYZAAR) 100-25 MG per tablet TAKE 1 TABLET BY MOUTH DAILY.  30 tablet  9  . pravastatin (PRAVACHOL) 20 MG tablet Take 1 tablet (20 mg total) by mouth every evening.  30  tablet  6  . verapamil (COVERA HS) 240 MG (CO) 24 hr tablet Take 1 tablet (240 mg total) by mouth at bedtime.  90 tablet  1  . carvedilol (COREG) 25 MG tablet Take 2 tablets (50 mg total) by mouth 2 (two) times daily.  120 tablet  11  . hydrALAZINE (APRESOLINE) 50 MG tablet Take 1 tablet (50 mg total) by mouth 2 (two) times daily.  180 tablet  1   No current facility-administered medications for this visit.    Past Medical History  Diagnosis Date  . Diabetes mellitus 1990    no insulin; A1c of 6.8 in 2009  . 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 Hyperlipidemia 2000  . Aortic stenosis     Very mild in 05/2007  . PSVT (paroxysmal supraventricular tachycardia)     Nonsustained; asymptomatic; diagnosed by event recorder in 2006  . Degenerative joint disease     S/p left TKA  . Tobacco abuse, in remission     20 pack years; discontinued in 1985; bronchitic changes on chest x-ray and 2009  . CHF (congestive heart failure)     Normal EF and 05/2007; attributed to Actos vs. diastolic dysfunction; chest x-ray and 2009- cardiomegaly and vascular redistribution    Past Surgical History  Procedure Laterality Date  . Total knee arthroplasty      Left  . Lumbar spine surgery      Multiple procedures    ROS: Review of systems complete and found to be negative unless listed above  PHYSICAL EXAM BP 112/64  Pulse 84  Ht 6\' 3"  (1.905 m)  Wt 234 lb (106.142 kg)  BMI 29.25 kg/m2  General: Well developed, well nourished, in no acute distress Head: Eyes PERRLA, No xanthomas.   Normal cephalic and atramatic  Lungs: Clear bilaterally to auscultation and percussion. Heart: HRRR S1 S2, without MRG.  Pulses are 2+ & equal.            No carotid bruit. No JVD.  No abdominal bruits. No femoral bruits. Abdomen: Bowel sounds are positive, abdomen soft and non-tender without masses or                  Hernia's noted. Msk:  Back normal, normal gait. Normal strength and tone for age. Extremities: No clubbing,  cyanosis or edema.  DP +1 Neuro: Alert and oriented X 3. Psych:  Good affect, responds appropriately    ASSESSMENT AND PLAN

## 2012-12-03 NOTE — Patient Instructions (Signed)
Your physician recommends that you schedule a follow-up appointment in: [place] has recommended you make the following change in your medication:   1) STOP TAKING BIDIL 2) START TAKING HYDRALAZINE 50MG  TWICE DAILY  Your physician has requested that you regularly monitor and record your blood pressure readings at home. Please use the same machine at the same time of day to check your readings and record them to bring to your follow-up visit.BRING THE READINGS INTO OUR OFFICE FOR REVIEW IN 2 WEEKS

## 2012-12-03 NOTE — Progress Notes (Deleted)
Name: Lance [name]    DOB: [birth year]  Age: 42 y.o.  MR#: [id number]       PCP:  [name], MD      Insurance: Payor: [place] / Plan: [place] / Product Type: *No Product type* /   CC:    Chief Complaint  Patient presents with  . Hypertension    VS Filed Vitals:   12/03/12 1603  BP: 112/64  Pulse: 84  Height: 6\' 3"  (1.905 m)  Weight: 234 lb (106.142 kg)    Weights Current Weight  12/03/12 234 lb (106.142 kg)  11/02/12 236 lb (107.049 kg)  10/30/12 242 lb (109.77 kg)    Blood Pressure  BP Readings from Last 3 Encounters:  12/03/12 112/64  11/02/12 170/91  10/30/12 108/66     Admit date:  (Not on file) Last encounter with RMR:  11/02/2012   Allergy Atorvastatin and Minoxidil  Current Outpatient Prescriptions  Medication Sig Dispense Refill  . amLODipine (NORVASC) 10 MG tablet Take 1 tablet (10 mg total) by mouth daily.  180 tablet  3  . aspirin EC 81 MG tablet Take 81 mg by mouth daily.      . cloNIDine (CATAPRES) 0.2 MG tablet TAKE 1 TABLET IN THE EVENING  30 tablet  6  . furosemide (LASIX) 80 MG tablet Take 80 mg by mouth daily.      . insulin lispro protamine-insulin lispro (HUMALOG 75/25) (75-25) 100 UNIT/ML SUSP Inject 58-80 Units into the skin daily. 80 -150  58 units 150-300  68 units 300-400  80 units       . insulin NPH-insulin regular (NOVOLIN 50/50) (50-50) 100 UNIT/ML injection Inject 30 Units into the skin daily before supper.        . isosorbide-hydrALAZINE (BIDIL) 20-37.5 MG per tablet Take 1 tablet by mouth 2 (two) times daily.  60 tablet  3  . losartan-hydrochlorothiazide (HYZAAR) 100-25 MG per tablet TAKE 1 TABLET BY MOUTH DAILY.  30 tablet  9  . pravastatin (PRAVACHOL) 20 MG tablet Take 1 tablet (20 mg total) by mouth every evening.  30 tablet  6  . carvedilol (COREG) 25 MG tablet Take 2 tablets (50 mg total) by mouth 2 (two) times daily.  120 tablet  11   No current facility-administered medications for this visit.     Discontinued Meds:   There are no discontinued medications.  Patient Active Problem List   Diagnosis Date Noted  . Chronic kidney disease, stage III (moderate) 09/29/2011  . Diabetes mellitus type 1 09/26/2010  . Hypertension, malignant 09/26/2010  . Hyperlipidemia-severe 09/26/2010    LABS    Component Value Date/Time   NA 137 02/24/2012 1615   NA 137 12/31/2011 0800   NA 133* 09/27/2010 0436   K 3.9 02/24/2012 1615   K 4.6 12/31/2011 0800   K 4.3 09/27/2010 0436   CL 103 02/24/2012 1615   CL 106 12/31/2011 0800   CL 102 09/27/2010 0436   CO2 25 02/24/2012 1615   CO2 23 12/31/2011 0800   CO2 18* 09/27/2010 0436   GLUCOSE 81 02/24/2012 1615   GLUCOSE 201* 12/31/2011 0800   GLUCOSE 391* 09/27/2010 0821   BUN 39* 02/24/2012 1615   BUN 37* 12/31/2011 0800   BUN 27* 09/27/2010 0436   CREATININE 1.94* 02/24/2012 1615   CREATININE 1.92* 12/31/2011 0800   CREATININE 1.19 09/27/2010 0436   CREATININE 1.34 09/26/2010 0646   CALCIUM 8.4 02/24/2012 1615  CALCIUM 8.7 12/31/2011 0800   CALCIUM 9.1 09/27/2010 0436   GFRNONAA >60 09/27/2010 0436   GFRNONAA 59* 09/26/2010 0646   GFRAA >60 09/27/2010 0436   GFRAA >60 09/26/2010 0646   CMP     Component Value Date/Time   NA 137 02/24/2012 1615   K 3.9 02/24/2012 1615   CL 103 02/24/2012 1615   CO2 25 02/24/2012 1615   GLUCOSE 81 02/24/2012 1615   BUN 39* 02/24/2012 1615   CREATININE 1.94* 02/24/2012 1615   CREATININE 1.19 09/27/2010 0436   CALCIUM 8.4 02/24/2012 1615   PROT 5.9* 12/31/2011 0800   ALBUMIN 2.9* 12/31/2011 0800   AST 10 12/31/2011 0800   ALT 9 12/31/2011 0800   ALKPHOS 70 12/31/2011 0800   BILITOT 0.4 12/31/2011 0800   GFRNONAA >60 09/27/2010 0436   GFRAA >60 09/27/2010 0436       Component Value Date/Time   WBC 6.8 09/27/2010 0436   WBC 9.0 09/26/2010 0646   HGB 13.3 09/27/2010 0436   HGB 15.0 09/26/2010 0646   HCT 38.5* 09/27/2010 0436   HCT 42.6 09/26/2010 0646   MCV 87.3 09/27/2010 0436   MCV 86.4 09/26/2010 0646    Lipid  Panel     Component Value Date/Time   CHOL 398* 02/24/2012 1615   TRIG 431* 02/24/2012 1615   HDL 80 02/24/2012 1615   CHOLHDL 5.0 02/24/2012 1615   VLDL NOT CALC 02/24/2012 1615   LDLCALC Comment:   Not calculated due to Triglyceride >400. Suggest ordering Direct LDL (Unit Code: [phone]).   Total Cholesterol/HDL Ratio:CHD Risk                        Coronary Heart Disease Risk Table                                        Men       Women          1/2 Average Risk              3.4        3.3              Average Risk              5.0        4.4           2X Average Risk              9.6        7.1           3X Average Risk             23.4       11.0 Use the calculated Patient Ratio above and the CHD Risk table  to determine the patient's CHD Risk. ATP III Classification (LDL):       < 100        mg/dL         Optimal      100 - 129     mg/dL         Near or Above Optimal      130 - 159     mg/dL         Borderline High      160 - 189     mg/dL  High       > 190        mg/dL         Very High   02/24/2012 1615    ABG No results found for this basename: phart, pco2, pco2art, po2, po2art, hco3, tco2, acidbasedef, o2sat     No results found for this basename: TSH   BNP (last 3 results) No results found for this basename: PROBNP,  in the last 8760 hours Cardiac Panel (last 3 results) No results found for this basename: CKTOTAL, CKMB, TROPONINI, RELINDX,  in the last 72 hours  Iron/TIBC/Ferritin No results found for this basename: iron, tibc, ferritin     EKG Orders placed in visit on 10/26/12  . EKG 12-LEAD     Prior Assessment and Plan Problem List as of 12/03/2012     Cardiovascular and Mediastinum   Hypertension, malignant   Last Assessment & Plan   11/02/2012 Office Visit Written 11/02/2012  3:29 PM by Lendon Colonel, NP     I will continue the BiDil BID dosing. He will have a sleep study completed to ascertain OSA as etiology of difficult to control hypertension. I will not make  any changes in his medication regimen at this time.  He will follow up with Dr. Harl Bowie or Bronson Ing in one month for discussion of results and ongoing hypertensive treatment.      Endocrine   Diabetes mellitus type 1   Last Assessment & Plan   09/26/2011 Office Visit Written 09/26/2011  1:40 PM by Yehuda Savannah, MD     Patient's diabetologist, Dr. [name], has been working closely with him to achieve improved diabetic control.  FBS in 07/2011 was 122 with a fructosamine of 238, which is "within the normal range".      Genitourinary   Chronic kidney disease, stage III (moderate)   Last Assessment & Plan   11/02/2012 Office Visit Written 11/02/2012  3:30 PM by Lendon Colonel, NP     Renal artery ultrasound is planned but not yet completed.      Other   Hyperlipidemia-severe   Last Assessment & Plan   10/26/2012 Office Visit Written 10/26/2012  4:11 PM by Lendon Colonel, NP     He states that he is unable to take statins. He is advised on low cholesterol diet. We may need to discuss bile acid sequestrant or addition of Zetia once labs have been reviewed from Dr. Anell Barr office.         Imaging: No results found.

## 2012-12-22 ENCOUNTER — Encounter (HOSPITAL_COMMUNITY): Payer: Self-pay | Admitting: Emergency Medicine

## 2012-12-22 ENCOUNTER — Emergency Department (HOSPITAL_COMMUNITY)
Admission: EM | Admit: 2012-12-22 | Discharge: 2012-12-22 | Disposition: A | Discharge status: Home / Self Care | Payer: BC Managed Care – PPO | Attending: Emergency Medicine | Admitting: Emergency Medicine

## 2012-12-22 ENCOUNTER — Emergency Department (HOSPITAL_COMMUNITY): Payer: BC Managed Care – PPO

## 2012-12-22 DIAGNOSIS — E119 Type 2 diabetes mellitus without complications: Secondary | ICD-10-CM | POA: Insufficient documentation

## 2012-12-22 DIAGNOSIS — J40 Bronchitis, not specified as acute or chronic: Secondary | ICD-10-CM

## 2012-12-22 DIAGNOSIS — Z79899 Other long term (current) drug therapy: Secondary | ICD-10-CM | POA: Insufficient documentation

## 2012-12-22 DIAGNOSIS — I509 Heart failure, unspecified: Secondary | ICD-10-CM | POA: Insufficient documentation

## 2012-12-22 DIAGNOSIS — M1990 Unspecified osteoarthritis, unspecified site: Secondary | ICD-10-CM | POA: Insufficient documentation

## 2012-12-22 DIAGNOSIS — E785 Hyperlipidemia, unspecified: Secondary | ICD-10-CM | POA: Insufficient documentation

## 2012-12-22 DIAGNOSIS — M7989 Other specified soft tissue disorders: Secondary | ICD-10-CM | POA: Insufficient documentation

## 2012-12-22 DIAGNOSIS — I10 Essential (primary) hypertension: Secondary | ICD-10-CM | POA: Insufficient documentation

## 2012-12-22 DIAGNOSIS — Z794 Long term (current) use of insulin: Secondary | ICD-10-CM | POA: Insufficient documentation

## 2012-12-22 DIAGNOSIS — Z87891 Personal history of nicotine dependence: Secondary | ICD-10-CM | POA: Insufficient documentation

## 2012-12-22 DIAGNOSIS — Z7982 Long term (current) use of aspirin: Secondary | ICD-10-CM | POA: Insufficient documentation

## 2012-12-22 DIAGNOSIS — J209 Acute bronchitis, unspecified: Secondary | ICD-10-CM | POA: Insufficient documentation

## 2012-12-22 LAB — CBC WITH DIFFERENTIAL/PLATELET
Eosinophils Absolute: 0.2 10*3/uL (ref 0.0–0.7)
Hemoglobin: 12.4 g/dL — ABNORMAL LOW (ref 13.0–17.0)
Lymphocytes Relative: 20 % (ref 12–46)
Lymphs Abs: 1.6 10*3/uL (ref 0.7–4.0)
Monocytes Relative: 9 % (ref 3–12)
Neutro Abs: 5.4 10*3/uL (ref 1.7–7.7)
Neutrophils Relative %: 68 % (ref 43–77)
Platelets: 349 10*3/uL (ref 150–400)
RBC: 3.94 MIL/uL — ABNORMAL LOW (ref 4.22–5.81)
WBC: 8 10*3/uL (ref 4.0–10.5)

## 2012-12-22 LAB — BASIC METABOLIC PANEL
Calcium: 8.8 mg/dL (ref 8.4–10.5)
GFR calc Af Amer: 41 mL/min — ABNORMAL LOW (ref >90)
GFR calc non Af Amer: 36 mL/min — ABNORMAL LOW (ref >90)
Glucose, Bld: 482 mg/dL — ABNORMAL HIGH (ref 70–99)
Potassium: 4.6 mEq/L (ref 3.5–5.1)
Sodium: 128 mEq/L — ABNORMAL LOW (ref 135–145)

## 2012-12-22 LAB — TROPONIN I: Troponin I: <0.3 ng/mL (ref <0.30)

## 2012-12-22 LAB — PRO B NATRIURETIC PEPTIDE: Pro B Natriuretic peptide (BNP): 416.8 pg/mL — ABNORMAL HIGH (ref 0–125)

## 2012-12-22 MED ORDER — ALBUTEROL SULFATE HFA 108 (90 BASE) MCG/ACT IN AERS: 1.0000 | INHALATION_SPRAY | Freq: Four times a day (QID) | RESPIRATORY_TRACT | Status: DC | PRN

## 2012-12-22 MED ORDER — AZITHROMYCIN 250 MG PO TABS: ORAL_TABLET | ORAL | Status: DC

## 2012-12-22 NOTE — ED Notes (Signed)
Pt reports having a head cold that has moved to his chest. States has a heaviness in his chest. Pt states cough is non productive. Denies fever, N/V/D

## 2012-12-22 NOTE — ED Provider Notes (Addendum)
CSN: [id number]     Arrival date & time 12/22/12  [id number] History  This chart was scribed for Nat Christen, MD by Eugenia Mcalpine, ED Scribe. This patient was seen in room APA10/[facility]10 and the patient's care was started at 7:11 AM.   Chief Complaint  Patient presents with  . Shortness of Breath   The history is provided by the patient. No language interpreter was used.  HPI Comments: Lance [name] is a 42 y.o. male who presents to the Emergency Department complaining of SOB with associated wheezing in the middle of his chest when he turns to his left. He states that when he bends over he feels short of breath, but not with exertion. Pt says that the symptoms began as a head cold that has gradually turned into a chest cold with associated non productive cough onset 3x weeks ago. Pt's wife states that he has been retaining fluid in his bilateral ankles. Pt saw Dr. [name] for the symptoms and was given antibiotics and 2 shots that he could not specify on oct 23rd with little relief. He has been taking mucinex with no relief. Pt denies, CP and fever.  Pt is an Recruitment consultant by trade. He has a hx of DM, high BP and cholesterol. He does not appear to be in any acute distress with no other complaints.    Past Medical History  Diagnosis Date  . Diabetes mellitus 1990    no insulin; A1c of 6.8 in 2009  . 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 Hyperlipidemia 2000  . Aortic stenosis     Very mild in 05/2007  . PSVT (paroxysmal supraventricular tachycardia)     Nonsustained; asymptomatic; diagnosed by event recorder in 2006  . Degenerative joint disease     S/p left TKA  . Tobacco abuse, in remission     20 pack years; discontinued in 1985; bronchitic changes on chest x-ray and 2009  . CHF (congestive heart failure)     Normal EF and 05/2007; attributed to Actos vs. diastolic dysfunction; chest x-ray and 2009- cardiomegaly and vascular redistribution   Past Surgical History  Procedure Laterality Date  . Total  knee arthroplasty      Left  . Lumbar spine surgery      Multiple procedures   Family History  Problem Relation Age of Onset  . Diabetes Father   . Diabetes Mother    History  Substance Use Topics  . Smoking status: Never Smoker   . Smokeless tobacco: Not on file  . Alcohol Use: Yes    Review of Systems  Cardiovascular: Positive for leg swelling.  Gastrointestinal: Negative for nausea and diarrhea.  All other systems reviewed and are negative.    Allergies  Atorvastatin and Minoxidil  Home Medications   Current Outpatient Rx  Name  Route  Sig  Dispense  Refill  . amLODipine (NORVASC) 10 MG tablet   Oral   Take 1 tablet (10 mg total) by mouth daily.   180 tablet   3   . aspirin EC 81 MG tablet   Oral   Take 81 mg by mouth daily.         . carvedilol (COREG) 25 MG tablet   Oral   Take 2 tablets (50 mg total) by mouth 2 (two) times daily.   120 tablet   11     Start after current metoprolol bottle is finished   . cloNIDine (CATAPRES) 0.2 MG tablet  TAKE 1 TABLET IN THE EVENING   30 tablet   6   . furosemide (LASIX) 80 MG tablet   Oral   Take 80 mg by mouth daily.         . hydrALAZINE (APRESOLINE) 50 MG tablet   Oral   Take 1 tablet (50 mg total) by mouth 2 (two) times daily.   180 tablet   1   . insulin lispro protamine-insulin lispro (HUMALOG 75/25) (75-25) 100 UNIT/ML SUSP   Subcutaneous   Inject 58-80 Units into the skin daily. 80 -150  58 units 150-300  68 units 300-400  80 units          . insulin NPH-insulin regular (NOVOLIN 50/50) (50-50) 100 UNIT/ML injection   Subcutaneous   Inject 30 Units into the skin daily before supper.           . losartan-hydrochlorothiazide (HYZAAR) 100-25 MG per tablet      TAKE 1 TABLET BY MOUTH DAILY.   30 tablet   9   . pravastatin (PRAVACHOL) 20 MG tablet   Oral   Take 1 tablet (20 mg total) by mouth every evening.   30 tablet   6   . rosuvastatin (CRESTOR) 40 MG tablet   Oral    Take 40 mg by mouth 2 (two) times a week.         . verapamil (COVERA HS) 240 MG (CO) 24 hr tablet   Oral   Take 1 tablet (240 mg total) by mouth at bedtime.   90 tablet   1    BP 167/82  Pulse 102  Temp(Src) 98.1 F (36.7 C) (Oral)  Ht 6\' 3"  (1.905 m)  Wt 240 lb (108.863 kg)  BMI 30.00 kg/m2  SpO2 94% Physical Exam  Nursing note and vitals reviewed. Constitutional: He is oriented to person, place, and time. He appears well-developed and well-nourished.  HENT:  Head: Normocephalic and atraumatic.  Eyes: Conjunctivae and EOM are normal. Pupils are equal, round, and reactive to light.  Neck: Normal range of motion. Neck supple.  Cardiovascular: Normal rate, regular rhythm and normal heart sounds.   Pulmonary/Chest: Effort normal and breath sounds normal.  Abdominal: Soft. Bowel sounds are normal.  Musculoskeletal: Normal range of motion.  Neurological: He is alert and oriented to person, place, and time.  Skin: Skin is warm and dry.  Psychiatric: He has a normal mood and affect.    ED Course  Procedures (including critical care time) DIAGNOSTIC STUDIES: Oxygen Saturation is 94% on RA, low by my interpretation.    COORDINATION OF CARE:  7:25 AM- Pt advised of plan for treatment including X-ray of his lungs and some blood work. Advised pt that the most likely explanation being some type of chest cold, or fluid in his lungs and pt agrees.  Labs Review Labs Reviewed  BASIC METABOLIC PANEL - Abnormal; Notable for the following:    Sodium 128 (*)    Glucose, Bld 482 (*)    BUN 38 (*)    Creatinine, Ser 2.16 (*)    GFR calc non Af Amer 36 (*)    GFR calc Af Amer 41 (*)    All other components within normal limits  CBC WITH DIFFERENTIAL - Abnormal; Notable for the following:    RBC 3.94 (*)    Hemoglobin 12.4 (*)    HCT 36.1 (*)    All other components within normal limits  PRO B NATRIURETIC PEPTIDE - Abnormal; Notable for the  following:    Pro B Natriuretic peptide  (BNP) 416.8 (*)    All other components within normal limits  TROPONIN I   Imaging Review Dg Chest 2 View  12/22/2012   CLINICAL DATA:  Shortness of breath, headache  EXAM: CHEST  2 VIEW  COMPARISON:  A 15 2012  FINDINGS: Mild cardiomegaly with slight interstitial prominence versus early edema. No effusion or pneumothorax. 2.6 cm nodular density projects along the left lower lobe infrahilar region and is posterior on the lateral view concerning for a medial left lower lobe lung nodule. This warrants further evaluation with chest CT. Trachea is midline.  IMPRESSION: Cardiomegaly with slight interstitial prominence versus developing mild edema  2.6 cm left lower lobe infrahilar nodular density versus nodule warrants further follow-up with chest CT.  These results will be called to the ordering clinician or representative by the Radiologist Assistant, and communication documented in the PACS Dashboard.   Electronically Signed   By: Daryll Brod M.D.   On: 12/22/2012 08:26   Ct Chest Wo Contrast  12/22/2012   CLINICAL DATA:  Assess pulmonary nodule  EXAM: CT CHEST WITHOUT CONTRAST  TECHNIQUE: Multidetector CT imaging of the chest was performed following the standard protocol without IV contrast.  COMPARISON:  Chest x-ray obtained earlier today at 7:52 a.m.; prior chest x-ray 09/26/2010  FINDINGS: Mediastinum:  Unremarkable CT appearance of the thyroid gland. No suspicious mediastinal or hilar adenopathy. No soft tissue mediastinal mass. The thoracic esophagus is unremarkable.  Heart/Vascular: Limited evaluation in the absence of IV contrast cardiomegaly. Atherosclerotic calcifications noted in the coronary arteries. No aneurysmal dilatation. No pericardial effusion.  Lungs/Pleura: Mild subpleural fat hypertrophy in the posterior aspect of the left lung. No definite pleural effusion. Trace dependent atelectasis in the bilateral lungs. The lungs are otherwise clear. No suspicious pulmonary nodule. The nodular  opacity identified on the chest x-ray corresponds with a prominent laterally directed osteophyte at T7-T8.  Bones/Soft Tissues: No acute fracture or aggressive appearing lytic or blastic osseous lesion.  Upper Abdomen: Unremarkable appearance of the partially visualized upper abdomen.  IMPRESSION: 1. No pulmonary nodule identified. The nodular opacity on the chest x-ray corresponds to a prominent lateral osteophyte at T7-T8. 2. Age advanced coronary artery atherosclerosis as identified by the presence of coronary artery calcifications. Recommend assessment of coronary risk factors and consideration of medical therapy. 3. Mild cardiomegaly.   Electronically Signed   By: Jacqulynn Cadet M.D.   On: 12/22/2012 11:00    EKG Interpretation   None       MDM  No diagnosis found. History and physical consistent with bronchitis. Initial chest x-ray shows concern for a density. CT scan does not confirm same.  Rx for Zithromax and albuterol inhaler. Patient has primary care followup.  I personally performed the services described in this documentation, which was scribed in my presence. The recorded information has been reviewed and is accurate.    Nat Christen, MD 12/22/12 1357  Nat Christen, MD 12/22/12 [phone]

## 2013-01-02 ENCOUNTER — Other Ambulatory Visit: Payer: Self-pay | Admitting: Cardiology

## 2013-01-04 ENCOUNTER — Encounter: Payer: BC Managed Care – PPO | Admitting: Adult Health

## 2013-01-04 NOTE — Progress Notes (Signed)
HPI: Lance [name] is a 42 y/o patient of  Dr. Domenic Polite that we are seeing today for ongoing assessment and management of malignant hypertension.  He remained in normal range with noon and evening dose of BiDil. He was sent for sleep study to evaluate for obstructive sleep apnea in the setting of difficult to control hypertension. This was completed on 10 14 2014 and was negative for obstructive sleep apnea. He was seen in the emergency room on 12/28/2012 for complaints of chest discomfort and congestion. Was started on antibiotics.   Allergies  Allergen Reactions  . Atorvastatin Other (See Comments)    Myalgias  . Minoxidil Other (See Comments)    Fluid retention    Current Outpatient Prescriptions  Medication Sig Dispense Refill  . albuterol (PROVENTIL HFA;VENTOLIN HFA) 108 (90 BASE) MCG/ACT inhaler Inhale 1-2 puffs into the lungs every 6 (six) hours as needed for wheezing or shortness of breath.  1 Inhaler  0  . amLODipine (NORVASC) 10 MG tablet Take 1 tablet (10 mg total) by mouth daily.  180 tablet  3  . aspirin EC 81 MG tablet Take 81 mg by mouth daily.      Marland Kitchen azithromycin (ZITHROMAX Z-PAK) 250 MG tablet 2 po day one, then 1 daily x 4 days  5 tablet  0  . carvedilol (COREG) 25 MG tablet Take 2 tablets (50 mg total) by mouth 2 (two) times daily.  120 tablet  11  . cloNIDine (CATAPRES) 0.2 MG tablet TAKE 1 TABLET IN THE EVENING  30 tablet  6  . furosemide (LASIX) 80 MG tablet Take 80 mg by mouth daily.      . hydrALAZINE (APRESOLINE) 50 MG tablet Take 1 tablet (50 mg total) by mouth 2 (two) times daily.  180 tablet  1  . insulin lispro protamine-insulin lispro (HUMALOG 75/25) (75-25) 100 UNIT/ML SUSP Inject 58-80 Units into the skin daily. 80 -150  58 units 150-300  68 units 300-400  80 units       . insulin NPH-insulin regular (NOVOLIN 50/50) (50-50) 100 UNIT/ML injection Inject 30 Units into the skin daily before supper.        . losartan-hydrochlorothiazide (HYZAAR) 100-25 MG per  tablet TAKE 1 TABLET BY MOUTH DAILY.  30 tablet  9  . pravastatin (PRAVACHOL) 20 MG tablet Take 1 tablet (20 mg total) by mouth every evening.  30 tablet  6  . rosuvastatin (CRESTOR) 40 MG tablet Take 40 mg by mouth 2 (two) times a week.      . verapamil (COVERA HS) 240 MG (CO) 24 hr tablet Take 1 tablet (240 mg total) by mouth at bedtime.  90 tablet  1   No current facility-administered medications for this visit.    Past Medical History  Diagnosis Date  . Diabetes mellitus 1990    no insulin; A1c of 6.8 in 2009  . 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 Hyperlipidemia 2000  . Aortic stenosis     Very mild in 05/2007  . PSVT (paroxysmal supraventricular tachycardia)     Nonsustained; asymptomatic; diagnosed by event recorder in 2006  . Degenerative joint disease     S/p left TKA  . Tobacco abuse, in remission     20 pack years; discontinued in 1985; bronchitic changes on chest x-ray and 2009  . CHF (congestive heart failure)     Normal EF and 05/2007; attributed to Actos vs. diastolic dysfunction; chest x-ray and 2009- cardiomegaly and vascular redistribution  Past Surgical History  Procedure Laterality Date  . Total knee arthroplasty      Left  . Lumbar spine surgery      Multiple procedures    ROS: PHYSICAL EXAM There were no vitals taken for this visit.  EKG:  ASSESSMENT AND PLAN

## 2013-01-15 ENCOUNTER — Ambulatory Visit: Payer: BC Managed Care – PPO | Admitting: Adult Health

## 2013-01-27 ENCOUNTER — Other Ambulatory Visit: Payer: Self-pay | Admitting: Cardiology

## 2013-02-08 ENCOUNTER — Encounter: Payer: Self-pay | Admitting: Adult Health

## 2013-02-08 ENCOUNTER — Ambulatory Visit (INDEPENDENT_AMBULATORY_CARE_PROVIDER_SITE_OTHER): Payer: BC Managed Care – PPO | Admitting: Adult Health

## 2013-02-08 VITALS — BP 142/70 | HR 77 | Ht 75.0 in | Wt 237.0 lb

## 2013-02-08 DIAGNOSIS — E785 Hyperlipidemia, unspecified: Secondary | ICD-10-CM

## 2013-02-08 DIAGNOSIS — I10 Essential (primary) hypertension: Secondary | ICD-10-CM

## 2013-02-08 NOTE — Assessment & Plan Note (Signed)
Recommend referral to nephrologist per PCP.

## 2013-02-08 NOTE — Assessment & Plan Note (Signed)
Blood pressure is well controlled currently. He will continue his medications as directed. He is on multiple medications for BP control with a history of RAS. He was not found to be a candidate for RFA ablation in the past due to diabetes. We may need to revisit this at some point.

## 2013-02-08 NOTE — Patient Instructions (Signed)
Your physician recommends that you schedule a follow-up appointment in: 6 months with Dr Bryna Colander will receive a reminder letter two months in advance reminding you to call and schedule your appointment. If you don't receive this letter, please contact our office.  Your physician recommends that you continue on your current medications as directed. Please refer to the Current Medication list given to you today.

## 2013-02-08 NOTE — Assessment & Plan Note (Signed)
He is followed by PCP for this. Remains on crestor.

## 2013-02-08 NOTE — Progress Notes (Signed)
HPI  Mr. Cirillo is a 42 year old patient of Dr. Domenic Polite that we are seeing for ongoing assessment and management of malignant hypertension. On last visit of October 2014 the patient was complaining of mild hypotension. He brought with him his blood pressure recordings at home. Office was 112/64 with a heart rate of 84. Patient was continued on hydralazine and 50 mg twice a day, (having been on BiDil). Of note the patient had been seen by Dr. Sabino Snipes at the [facility] clinic for consideration of RFA, but was disqualified in the setting of type 1 diabetes. Consideration for changing dose of verapamil will be made on next visit.  He is doing well. BP is stable..Slightly low in the am at 112/66 but has high as 140/76 by the afternoon.  He is medically compliant. He denies dizziness, headache or nausea. He remains on multiple medications. He is followed by Dr Jeanann Lewandowsky for diabetes. He is not followed by nephrologist at this time.  Allergies  Allergen Reactions  . Atorvastatin Other (See Comments)    Myalgias  . Minoxidil Other (See Comments)    Fluid retention    Current Outpatient Prescriptions  Medication Sig Dispense Refill  . albuterol (PROVENTIL HFA;VENTOLIN HFA) 108 (90 BASE) MCG/ACT inhaler Inhale 1-2 puffs into the lungs every 6 (six) hours as needed for wheezing or shortness of breath.  1 Inhaler  0  . amLODipine (NORVASC) 10 MG tablet Take 1 tablet (10 mg total) by mouth daily.  180 tablet  3  . aspirin EC 81 MG tablet Take 81 mg by mouth daily.      . carvedilol (COREG) 25 MG tablet TAKE 2 TABLETS (50 MG TOTAL) BY MOUTH 2 (TWO) TIMES DAILY.  120 tablet  7  . cloNIDine (CATAPRES) 0.2 MG tablet TAKE 1 TABLET IN THE EVENING  30 tablet  6  . furosemide (LASIX) 80 MG tablet Take 80 mg by mouth daily.      . hydrALAZINE (APRESOLINE) 50 MG tablet Take 1 tablet (50 mg total) by mouth 2 (two) times daily.  180 tablet  1  . insulin lispro protamine-insulin lispro (HUMALOG 75/25)  (75-25) 100 UNIT/ML SUSP Inject 58-80 Units into the skin daily. 80 -150  58 units 150-300  68 units 300-400  80 units       . insulin NPH-insulin regular (NOVOLIN 50/50) (50-50) 100 UNIT/ML injection Inject 30 Units into the skin daily before supper.        . losartan-hydrochlorothiazide (HYZAAR) 100-25 MG per tablet TAKE 1 TABLET BY MOUTH DAILY.  30 tablet  9  . pravastatin (PRAVACHOL) 20 MG tablet Take 1 tablet (20 mg total) by mouth every evening.  30 tablet  6  . rosuvastatin (CRESTOR) 40 MG tablet Take 40 mg by mouth 2 (two) times a week.      . verapamil (COVERA HS) 240 MG (CO) 24 hr tablet Take 1 tablet (240 mg total) by mouth at bedtime.  90 tablet  1   No current facility-administered medications for this visit.    Past Medical History  Diagnosis Date  . Diabetes mellitus 1990    no insulin; A1c of 6.8 in 2009  . 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 Hyperlipidemia 2000  . Aortic stenosis     Very mild in 05/2007  . PSVT (paroxysmal supraventricular tachycardia)     Nonsustained; asymptomatic; diagnosed by event recorder in 2006  . Degenerative joint disease     S/p left TKA  .  Tobacco abuse, in remission     20 pack years; discontinued in 1985; bronchitic changes on chest x-ray and 2009  . CHF (congestive heart failure)     Normal EF and 05/2007; attributed to Actos vs. diastolic dysfunction; chest x-ray and 2009- cardiomegaly and vascular redistribution    Past Surgical History  Procedure Laterality Date  . Total knee arthroplasty      Left  . Lumbar spine surgery      Multiple procedures    [id number] of systems complete and found to be negative unless listed above  PHYSICAL EXAM BP 142/70  Pulse 77  Ht 6\' 3"  (1.905 m)  Wt 237 lb (107.502 kg)  BMI 29.62 kg/m2   General: Well developed, well nourished, in no acute distress Head: Eyes PERRLA, No xanthomas.   Normal cephalic and atramatic  Lungs: Clear bilaterally to auscultation and percussion. Heart: HRRR S1 S2,  without MRG.  Pulses are 2+ & equal.            No carotid bruit. No JVD.  No abdominal bruits. No femoral bruits. Abdomen: Bowel sounds are positive, abdomen soft and non-tender without masses or                  Hernia's noted. Msk:  Back normal, normal gait. Normal strength and tone for age. Extremities: No clubbing, cyanosis or edema.  DP +1 Neuro: Alert and oriented X 3. Psych:  Good affect, responds appropriately    ASSESSMENT AND PLAN unless this in October of 2014

## 2013-02-08 NOTE — Progress Notes (Deleted)
Name: Lance [name]    DOB: [birth year]  Age: 42 y.o.  MR#: [id number]       PCP:  [name], MD      Insurance: Payor: [place] / Plan: BCBS PPO OUT OF STATE / Product Type: *No Product type* /   CC:    Chief Complaint  Patient presents with  . Hypertension  . Chronic Kidney Disease    VS Filed Vitals:   02/08/13 1445  BP: 142/70  Pulse: 77  Height: 6\' 3"  (1.905 m)  Weight: 237 lb (107.502 kg)    Weights Current Weight  02/08/13 237 lb (107.502 kg)  12/22/12 240 lb (108.863 kg)  12/03/12 234 lb (106.142 kg)    Blood Pressure  BP Readings from Last 3 Encounters:  02/08/13 142/70  12/22/12 170/84  12/03/12 112/64     Admit date:  (Not on file) Last encounter with RMR:  12/03/2012   Allergy Atorvastatin and Minoxidil  Current Outpatient Prescriptions  Medication Sig Dispense Refill  . albuterol (PROVENTIL HFA;VENTOLIN HFA) 108 (90 BASE) MCG/ACT inhaler Inhale 1-2 puffs into the lungs every 6 (six) hours as needed for wheezing or shortness of breath.  1 Inhaler  0  . amLODipine (NORVASC) 10 MG tablet Take 1 tablet (10 mg total) by mouth daily.  180 tablet  3  . aspirin EC 81 MG tablet Take 81 mg by mouth daily.      Marland Kitchen azithromycin (ZITHROMAX Z-PAK) 250 MG tablet 2 po day one, then 1 daily x 4 days  5 tablet  0  . carvedilol (COREG) 25 MG tablet TAKE 2 TABLETS (50 MG TOTAL) BY MOUTH 2 (TWO) TIMES DAILY.  120 tablet  7  . cloNIDine (CATAPRES) 0.2 MG tablet TAKE 1 TABLET IN THE EVENING  30 tablet  6  . furosemide (LASIX) 80 MG tablet Take 80 mg by mouth daily.      . furosemide (LASIX) 80 MG tablet TAKE 1 TABLET BY MOUTH DAILY.  30 tablet  6  . hydrALAZINE (APRESOLINE) 50 MG tablet Take 1 tablet (50 mg total) by mouth 2 (two) times daily.  180 tablet  1  . insulin lispro protamine-insulin lispro (HUMALOG 75/25) (75-25) 100 UNIT/ML SUSP Inject 58-80 Units into the skin daily. 80 -150  58 units 150-300  68 units 300-400  80 units       . insulin NPH-insulin  regular (NOVOLIN 50/50) (50-50) 100 UNIT/ML injection Inject 30 Units into the skin daily before supper.        . losartan-hydrochlorothiazide (HYZAAR) 100-25 MG per tablet TAKE 1 TABLET BY MOUTH DAILY.  30 tablet  9  . pravastatin (PRAVACHOL) 20 MG tablet Take 1 tablet (20 mg total) by mouth every evening.  30 tablet  6  . rosuvastatin (CRESTOR) 40 MG tablet Take 40 mg by mouth 2 (two) times a week.      . verapamil (COVERA HS) 240 MG (CO) 24 hr tablet Take 1 tablet (240 mg total) by mouth at bedtime.  90 tablet  1   No current facility-administered medications for this visit.    Discontinued Meds:   There are no discontinued medications.  Patient Active Problem List   Diagnosis Date Noted  . Chronic kidney disease, stage III (moderate) 09/29/2011  . Diabetes mellitus type 1 09/26/2010  . Hypertension, malignant 09/26/2010  . Hyperlipidemia-severe 09/26/2010    LABS    Component Value Date/Time   NA 128* 12/22/2012 0757   NA 137  02/24/2012 1615   NA 137 12/31/2011 0800   K 4.6 12/22/2012 0757   K 3.9 02/24/2012 1615   K 4.6 12/31/2011 0800   CL 98 12/22/2012 0757   CL 103 02/24/2012 1615   CL 106 12/31/2011 0800   CO2 21 12/22/2012 0757   CO2 25 02/24/2012 1615   CO2 23 12/31/2011 0800   GLUCOSE 482* 12/22/2012 0757   GLUCOSE 81 02/24/2012 1615   GLUCOSE 201* 12/31/2011 0800   BUN 38* 12/22/2012 0757   BUN 39* 02/24/2012 1615   BUN 37* 12/31/2011 0800   CREATININE 2.16* 12/22/2012 0757   CREATININE 1.94* 02/24/2012 1615   CREATININE 1.92* 12/31/2011 0800   CREATININE 1.19 09/27/2010 0436   CREATININE 1.34 09/26/2010 0646   CALCIUM 8.8 12/22/2012 0757   CALCIUM 8.4 02/24/2012 1615   CALCIUM 8.7 12/31/2011 0800   GFRNONAA 36* 12/22/2012 0757   GFRNONAA >60 09/27/2010 0436   GFRNONAA 59* 09/26/2010 0646   GFRAA 41* 12/22/2012 0757   GFRAA >60 09/27/2010 0436   GFRAA >60 09/26/2010 0646   CMP     Component Value Date/Time   NA 128* 12/22/2012 0757   K 4.6 12/22/2012 0757    CL 98 12/22/2012 0757   CO2 21 12/22/2012 0757   GLUCOSE 482* 12/22/2012 0757   BUN 38* 12/22/2012 0757   CREATININE 2.16* 12/22/2012 0757   CREATININE 1.94* 02/24/2012 1615   CALCIUM 8.8 12/22/2012 0757   PROT 5.9* 12/31/2011 0800   ALBUMIN 2.9* 12/31/2011 0800   AST 10 12/31/2011 0800   ALT 9 12/31/2011 0800   ALKPHOS 70 12/31/2011 0800   BILITOT 0.4 12/31/2011 0800   GFRNONAA 36* 12/22/2012 0757   GFRAA 41* 12/22/2012 0757       Component Value Date/Time   WBC 8.0 12/22/2012 0757   WBC 6.8 09/27/2010 0436   WBC 9.0 09/26/2010 0646   HGB 12.4* 12/22/2012 0757   HGB 13.3 09/27/2010 0436   HGB 15.0 09/26/2010 0646   HCT 36.1* 12/22/2012 0757   HCT 38.5* 09/27/2010 0436   HCT 42.6 09/26/2010 0646   MCV 91.6 12/22/2012 0757   MCV 87.3 09/27/2010 0436   MCV 86.4 09/26/2010 0646    Lipid Panel     Component Value Date/Time   CHOL 398* 02/24/2012 1615   TRIG 431* 02/24/2012 1615   HDL 80 02/24/2012 1615   CHOLHDL 5.0 02/24/2012 1615   VLDL NOT CALC 02/24/2012 1615   LDLCALC Comment:   Not calculated due to Triglyceride >400. Suggest ordering Direct LDL (Unit Code: [id number]).   Total Cholesterol/HDL Ratio:CHD Risk                        Coronary Heart Disease Risk Table                                        Men       Women          1/2 Average Risk              3.4        3.3              Average Risk              5.0        4.4  2X Average Risk              9.6        7.1           3X Average Risk             23.4       11.0 Use the calculated Patient Ratio above and the CHD Risk table  to determine the patient's CHD Risk. ATP III Classification (LDL):       < 100        mg/dL         Optimal      100 - 129     mg/dL         Near or Above Optimal      130 - 159     mg/dL         Borderline High      160 - 189     mg/dL         High       > 190        mg/dL         Very High   02/24/2012 1615    ABG No results found for this basename: phart, pco2, pco2art, po2, po2art, hco3, tco2,  acidbasedef, o2sat     No results found for this basename: TSH   BNP (last 3 results)  Recent Labs  12/22/12 0757  PROBNP 416.8*   Cardiac Panel (last 3 results) No results found for this basename: CKTOTAL, CKMB, TROPONINI, RELINDX,  in the last 72 hours  Iron/TIBC/Ferritin No results found for this basename: iron, tibc, ferritin     EKG Orders placed during the hospital encounter of 12/22/12  . ED EKG  . ED EKG  . EKG 12-LEAD  . EKG 12-LEAD  . EKG     Prior Assessment and Plan Problem List as of 02/08/2013   Diabetes mellitus type 1   Last Assessment & Plan   12/03/2012 Office Visit Written 12/03/2012  4:50 PM by Lendon Colonel, NP     Followup with primary care for continued management.    Hypertension, malignant   Last Assessment & Plan   12/03/2012 Office Visit Edited 12/03/2012  4:50 PM by Lendon Colonel, NP     The patient is on multiple medications for control. He is now tolerating BiDil she feels lightheaded and dizzy on this medication. He is negative for OSA. He is on both amlodipine, and verapamil, iodine, start and HCTZ, and carvedilol. Blood pressure log has blood pressures running in the 160s over 90s in the evening, and in the 110 in the am.  I will take him off the nitrate portion of BiDil but continue hydralazine at lower dose at 50 mg twice a day. May need to decrease dose of at bedtime verapamil for early morning hypotension. I will check a BMET in a week to evaluate kidney function. They need to consider ultrasound of renal artery to evaluate for renal artery stenosis.    It has been noted in the overview, that the patient was sent to Dr. Sabino Snipes at the [facility] clinic for consideration of RFA, but was occluded in the setting of type 1 diabetes.      Hyperlipidemia-severe   Last Assessment & Plan   10/26/2012 Office Visit Written 10/26/2012  4:11 PM by Lendon Colonel, NP     He states that he is unable to take statins. He is  advised on  low cholesterol diet. We may need to discuss bile acid sequestrant or addition of Zetia once labs have been reviewed from Dr. Anell Barr office.     Chronic kidney disease, stage III (moderate)   Last Assessment & Plan   12/03/2012 Office Visit Written 12/03/2012  4:49 PM by Lendon Colonel, NP     Followup BMET will be completed.        Imaging: No results found.

## 2013-02-10 ENCOUNTER — Other Ambulatory Visit: Payer: Self-pay | Admitting: Adult Health

## 2013-03-08 ENCOUNTER — Other Ambulatory Visit: Payer: Self-pay | Admitting: Cardiology

## 2013-03-26 ENCOUNTER — Ambulatory Visit (INDEPENDENT_AMBULATORY_CARE_PROVIDER_SITE_OTHER): Payer: BC Managed Care – PPO | Admitting: Adult Health

## 2013-03-26 ENCOUNTER — Encounter: Payer: Self-pay | Admitting: Adult Health

## 2013-03-26 VITALS — BP 158/82 | HR 81 | Ht 75.0 in | Wt 247.0 lb

## 2013-03-26 DIAGNOSIS — N183 Chronic kidney disease, stage 3 unspecified: Secondary | ICD-10-CM

## 2013-03-26 DIAGNOSIS — I10 Essential (primary) hypertension: Secondary | ICD-10-CM

## 2013-03-26 DIAGNOSIS — R600 Localized edema: Secondary | ICD-10-CM

## 2013-03-26 DIAGNOSIS — R609 Edema, unspecified: Secondary | ICD-10-CM

## 2013-03-26 MED ORDER — FUROSEMIDE 80 MG PO TABS: 80.0000 mg | ORAL_TABLET | Freq: Two times a day (BID) | ORAL | Status: DC

## 2013-03-26 NOTE — Assessment & Plan Note (Signed)
On assessment, the patient does have some lower extremity edema, which she states is worse over the last several days. He increased his Lasix to 80 mg twice a day but has not found any relief. He denies any dietary noncompliance, but is vague about what he is eating.  I have advised him to continue to take the Lasix 80 mg twice a day, avoid salt intake, I considered giving him 1 to 2 doses of metolazone however review of his labs shows that he has had some issues with hyponatremia in the past. I will check a BMET. He will followup with Dr. Harl Bowie next week for further assessment. May need to consider changing to torsemide for better bioavailability, or adding a couple days of metolazone should his labs allow.

## 2013-03-26 NOTE — Progress Notes (Deleted)
Name: Lance [name]    DOB: Sep 01, 1970  Age: 43 y.o.  MR#: [id number]       PCP:  [name], MD      Insurance: Payor: [place] / Plan: BCBS PPO OUT OF STATE / Product Type: *No Product type* /   CC:   No chief complaint on file.   VS Filed Vitals:   03/26/13 1433  BP: 158/82  Pulse: 81  Height: 6\' 3"  (1.905 m)  Weight: 247 lb (112.038 kg)  SpO2: 100%    Weights Current Weight  03/26/13 247 lb (112.038 kg)  02/08/13 237 lb (107.502 kg)  12/22/12 240 lb (108.863 kg)    Blood Pressure  BP Readings from Last 3 Encounters:  03/26/13 158/82  02/08/13 142/70  12/22/12 170/84     Admit date:  (Not on file) Last encounter with RMR:  02/10/2013   Allergy Atorvastatin and Minoxidil  Current Outpatient Prescriptions  Medication Sig Dispense Refill  . amLODipine (NORVASC) 10 MG tablet TAKE 1 TABLET EVERY DAY  90 tablet  6  . aspirin EC 81 MG tablet Take 81 mg by mouth daily.      . carvedilol (COREG) 25 MG tablet TAKE 2 TABLETS (50 MG TOTAL) BY MOUTH 2 (TWO) TIMES DAILY.  120 tablet  7  . cloNIDine (CATAPRES) 0.2 MG tablet TAKE 1 TABLET IN THE EVENING  30 tablet  6  . furosemide (LASIX) 80 MG tablet Take 80 mg by mouth daily.      . hydrALAZINE (APRESOLINE) 50 MG tablet TAKE 1 TABLET (50 MG TOTAL) BY MOUTH 2 (TWO) TIMES DAILY.  180 tablet  0  . insulin lispro protamine-insulin lispro (HUMALOG 75/25) (75-25) 100 UNIT/ML SUSP Inject 58-80 Units into the skin daily. 80 -150  58 units 150-300  68 units 300-400  80 units       . insulin NPH-insulin regular (NOVOLIN 50/50) (50-50) 100 UNIT/ML injection Inject 30 Units into the skin daily before supper.        . losartan-hydrochlorothiazide (HYZAAR) 100-25 MG per tablet TAKE 1 TABLET BY MOUTH DAILY.  30 tablet  9  . rosuvastatin (CRESTOR) 40 MG tablet Take 40 mg by mouth 2 (two) times a week.      . verapamil (COVERA HS) 240 MG (CO) 24 hr tablet Take 1 tablet (240 mg total) by mouth at bedtime.  90 tablet  1   No  current facility-administered medications for this visit.    Discontinued Meds:    Medications Discontinued During This Encounter  Medication Reason  . albuterol (PROVENTIL HFA;VENTOLIN HFA) 108 (90 BASE) MCG/ACT inhaler Error  . pravastatin (PRAVACHOL) 20 MG tablet Error    Patient Active Problem List   Diagnosis Date Noted  . Chronic kidney disease, stage III (moderate) 09/29/2011  . Diabetes mellitus type 1 09/26/2010  . Hypertension, malignant 09/26/2010  . Hyperlipidemia-severe 09/26/2010    LABS    Component Value Date/Time   NA 128* 12/22/2012 0757   NA 137 02/24/2012 1615   NA 137 12/31/2011 0800   K 4.6 12/22/2012 0757   K 3.9 02/24/2012 1615   K 4.6 12/31/2011 0800   CL 98 12/22/2012 0757   CL 103 02/24/2012 1615   CL 106 12/31/2011 0800   CO2 21 12/22/2012 0757   CO2 25 02/24/2012 1615   CO2 23 12/31/2011 0800   GLUCOSE 482* 12/22/2012 0757   GLUCOSE 81 02/24/2012 1615   GLUCOSE 201* 12/31/2011 0800   BUN  38* 12/22/2012 0757   BUN 39* 02/24/2012 1615   BUN 37* 12/31/2011 0800   CREATININE 2.16* 12/22/2012 0757   CREATININE 1.94* 02/24/2012 1615   CREATININE 1.92* 12/31/2011 0800   CREATININE 1.19 09/27/2010 0436   CREATININE 1.34 09/26/2010 0646   CALCIUM 8.8 12/22/2012 0757   CALCIUM 8.4 02/24/2012 1615   CALCIUM 8.7 12/31/2011 0800   GFRNONAA 36* 12/22/2012 0757   GFRNONAA >60 09/27/2010 0436   GFRNONAA 59* 09/26/2010 0646   GFRAA 41* 12/22/2012 0757   GFRAA >60 09/27/2010 0436   GFRAA >60 09/26/2010 0646   CMP     Component Value Date/Time   NA 128* 12/22/2012 0757   K 4.6 12/22/2012 0757   CL 98 12/22/2012 0757   CO2 21 12/22/2012 0757   GLUCOSE 482* 12/22/2012 0757   BUN 38* 12/22/2012 0757   CREATININE 2.16* 12/22/2012 0757   CREATININE 1.94* 02/24/2012 1615   CALCIUM 8.8 12/22/2012 0757   PROT 5.9* 12/31/2011 0800   ALBUMIN 2.9* 12/31/2011 0800   AST 10 12/31/2011 0800   ALT 9 12/31/2011 0800   ALKPHOS 70 12/31/2011 0800   BILITOT 0.4  12/31/2011 0800   GFRNONAA 36* 12/22/2012 0757   GFRAA 41* 12/22/2012 0757       Component Value Date/Time   WBC 8.0 12/22/2012 0757   WBC 6.8 09/27/2010 0436   WBC 9.0 09/26/2010 0646   HGB 12.4* 12/22/2012 0757   HGB 13.3 09/27/2010 0436   HGB 15.0 09/26/2010 0646   HCT 36.1* 12/22/2012 0757   HCT 38.5* 09/27/2010 0436   HCT 42.6 09/26/2010 0646   MCV 91.6 12/22/2012 0757   MCV 87.3 09/27/2010 0436   MCV 86.4 09/26/2010 0646    Lipid Panel     Component Value Date/Time   CHOL 398* 02/24/2012 1615   TRIG 431* 02/24/2012 1615   HDL 80 02/24/2012 1615   CHOLHDL 5.0 02/24/2012 1615   VLDL NOT CALC 02/24/2012 1615   LDLCALC Comment:   Not calculated due to Triglyceride >400. Suggest ordering Direct LDL (Unit Code: [phone]).   Total Cholesterol/HDL Ratio:CHD Risk                        Coronary Heart Disease Risk Table                                        Men       Women          1/2 Average Risk              3.4        3.3              Average Risk              5.0        4.4           2X Average Risk              9.6        7.1           3X Average Risk             23.4       11.0 Use the calculated Patient Ratio above and the CHD Risk table  to determine the patient's CHD Risk. ATP III Classification (LDL):       <  100        mg/dL         Optimal      100 - 129     mg/dL         Near or Above Optimal      130 - 159     mg/dL         Borderline High      160 - 189     mg/dL         High       > 190        mg/dL         Very High   02/24/2012 1615    ABG No results found for this basename: phart, pco2, pco2art, po2, po2art, hco3, tco2, acidbasedef, o2sat     No results found for this basename: TSH   BNP (last 3 results)  Recent Labs  12/22/12 0757  PROBNP 416.8*   Cardiac Panel (last 3 results) No results found for this basename: CKTOTAL, CKMB, TROPONINI, RELINDX,  in the last 72 hours  Iron/TIBC/Ferritin No results found for this basename: iron, tibc, ferritin     EKG Orders  placed during the hospital encounter of 12/22/12  . ED EKG  . ED EKG  . EKG 12-LEAD  . EKG 12-LEAD  . EKG     Prior Assessment and Plan Problem List as of 03/26/2013     Cardiovascular and Mediastinum   Hypertension, malignant   Last Assessment & Plan   02/08/2013 Office Visit Written 02/08/2013  3:18 PM by Lendon Colonel, NP     Blood pressure is well controlled currently. He will continue his medications as directed. He is on multiple medications for BP control with a history of RAS. He was not found to be a candidate for RFA ablation in the past due to diabetes. We may need to revisit this at some point.       Endocrine   Diabetes mellitus type 1   Last Assessment & Plan   12/03/2012 Office Visit Written 12/03/2012  4:50 PM by Lendon Colonel, NP     Followup with primary care for continued management.      Genitourinary   Chronic kidney disease, stage III (moderate)   Last Assessment & Plan   02/08/2013 Office Visit Written 02/08/2013  3:19 PM by Lendon Colonel, NP     Recommend referral to nephrologist per PCP.      Other   Hyperlipidemia-severe   Last Assessment & Plan   02/08/2013 Office Visit Written 02/08/2013  3:19 PM by Lendon Colonel, NP     He is followed by PCP for this. Remains on crestor.         Imaging: No results found.

## 2013-03-26 NOTE — Assessment & Plan Note (Addendum)
Blood pressure is moderately elevated today. He continues on amlodipine, carvedilol, clonidine, verapamil, hydralazine, and losartan HCTZ. I have advised him on a low-sodium diet. These medications can cause some lower extremity edema. As stated, a BMET will be drawn, and he is referred to nephrology.

## 2013-03-26 NOTE — Progress Notes (Signed)
HPI: [name] is a 43 year old male patient to be est. with Dr. Harl Bowie we are seeing for ongoing assessment and management of malignant hypertension, history of chronic kidney disease stage III, hyperlipidemia. He comes today having gained 10 pounds, and complaining of lower extremity edema. He states that the edema has worsened. He denies any increase salt intake, but is vague about it.  He is followed by Dr. Jeanann Lewandowsky for diabetes, but has not been est. with the nephrologist due to chronic kidney disease. He denies shortness of breath, PND, or orthopnea. Main complaint is the lower extremity edema and some constipation. He has increased his Lasix to 80 mg twice a day from 80 mg daily with the last 3 days without improvement in lower extremity edema. He is also taken one dose of a stool softener to assist with constipation. He remains medically compliant.  Allergies  Allergen Reactions  . Atorvastatin Other (See Comments)    Myalgias  . Minoxidil Other (See Comments)    Fluid retention    Current Outpatient Prescriptions  Medication Sig Dispense Refill  . amLODipine (NORVASC) 10 MG tablet TAKE 1 TABLET EVERY DAY  90 tablet  6  . aspirin EC 81 MG tablet Take 81 mg by mouth daily.      . carvedilol (COREG) 25 MG tablet TAKE 2 TABLETS (50 MG TOTAL) BY MOUTH 2 (TWO) TIMES DAILY.  120 tablet  7  . cloNIDine (CATAPRES) 0.2 MG tablet TAKE 1 TABLET IN THE EVENING  30 tablet  6  . furosemide (LASIX) 80 MG tablet Take 1 tablet (80 mg total) by mouth 2 (two) times daily.      . hydrALAZINE (APRESOLINE) 50 MG tablet TAKE 1 TABLET (50 MG TOTAL) BY MOUTH 2 (TWO) TIMES DAILY.  180 tablet  0  . insulin lispro protamine-insulin lispro (HUMALOG 75/25) (75-25) 100 UNIT/ML SUSP Inject 58-80 Units into the skin daily. 80 -150  58 units 150-300  68 units 300-400  80 units       . insulin NPH-insulin regular (NOVOLIN 50/50) (50-50) 100 UNIT/ML injection Inject 30 Units into the skin daily before supper.         . losartan-hydrochlorothiazide (HYZAAR) 100-25 MG per tablet TAKE 1 TABLET BY MOUTH DAILY.  30 tablet  9  . rosuvastatin (CRESTOR) 40 MG tablet Take 40 mg by mouth 2 (two) times a week.      . verapamil (COVERA HS) 240 MG (CO) 24 hr tablet Take 1 tablet (240 mg total) by mouth at bedtime.  90 tablet  1   No current facility-administered medications for this visit.    Past Medical History  Diagnosis Date  . Diabetes mellitus 1990    no insulin; A1c of 6.8 in 2009  . 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 Hyperlipidemia 2000  . Aortic stenosis     Very mild in 05/2007  . PSVT (paroxysmal supraventricular tachycardia)     Nonsustained; asymptomatic; diagnosed by event recorder in 2006  . Degenerative joint disease     S/p left TKA  . Tobacco abuse, in remission     20 pack years; discontinued in 1985; bronchitic changes on chest x-ray and 2009  . CHF (congestive heart failure)     Normal EF and 05/2007; attributed to Actos vs. diastolic dysfunction; chest x-ray and 2009- cardiomegaly and vascular redistribution    Past Surgical History  Procedure Laterality Date  . Total knee arthroplasty      Left  .  Lumbar spine surgery      Multiple procedures    ROS: Review of systems complete and found to be negative unless listed above  PHYSICAL EXAM BP 158/82  Pulse 81  Ht 6\' 3"  (1.905 m)  Wt 247 lb (112.038 kg)  BMI 30.87 kg/m2  SpO2 100%  General: Well developed, well nourished, in no acute distress Head: Eyes PERRLA, No xanthomas.   Normal cephalic and atramatic  Lungs: Clear bilaterally to auscultation and percussion. Heart: HRRR S1 S2, without MRG.  Pulses are 2+ & equal.            No carotid bruit. No JVD.  No abdominal bruits. No femoral bruits. Abdomen: Bowel sounds are positive, abdomen soft and non-tender without masses or                  Hernia's noted. Msk:  Back normal, normal gait. Normal strength and tone for age. Extremities: No clubbing, cyanosis 1+ right leg, and  2+ left leg edema.  DP +1 Neuro: Alert and oriented X 3. Psych:  Good affect, responds appropriately    ASSESSMENT AND PLAN

## 2013-03-26 NOTE — Patient Instructions (Addendum)
Your physician recommends that you schedule a follow-up appointment in: next week with Dr Harl Bowie  Your physician recommends that you return for lab work today. BMET, PRo BNP  Take Lasix 80 mg twice a day until seen by Dr Harl Bowie.  You have been referred to Dr Lowanda Foster

## 2013-03-26 NOTE — Assessment & Plan Note (Signed)
I have referred him to Dr. Lowanda Foster, nephrologist, for ongoing assessment and management of his chronic kidney disease. He has not yet been est. with the nephrologist. I feel that this would be very important for him to have a kidney specialist to follow him closely with further recommendations. He verbalizes understanding and is willing to followup with Dr. Lowanda Foster

## 2013-03-27 LAB — BASIC METABOLIC PANEL
BUN: 46 mg/dL — ABNORMAL HIGH (ref 6–23)
CO2: 23 mEq/L (ref 19–32)
Calcium: 8.5 mg/dL (ref 8.4–10.5)
Chloride: 103 mEq/L (ref 96–112)
Creat: 2.21 mg/dL — ABNORMAL HIGH (ref 0.50–1.35)
GLUCOSE: 266 mg/dL — AB (ref 70–99)
POTASSIUM: 4.5 meq/L (ref 3.5–5.3)
Sodium: 134 mEq/L — ABNORMAL LOW (ref 135–145)

## 2013-03-29 ENCOUNTER — Encounter: Payer: Self-pay | Admitting: Adult Health

## 2013-03-29 ENCOUNTER — Ambulatory Visit: Payer: BC Managed Care – PPO | Admitting: Cardiology

## 2013-03-29 ENCOUNTER — Ambulatory Visit (INDEPENDENT_AMBULATORY_CARE_PROVIDER_SITE_OTHER): Payer: BC Managed Care – PPO | Admitting: Adult Health

## 2013-03-29 VITALS — BP 151/75 | HR 94 | Ht 75.0 in | Wt 245.5 lb

## 2013-03-29 DIAGNOSIS — R609 Edema, unspecified: Secondary | ICD-10-CM

## 2013-03-29 DIAGNOSIS — R600 Localized edema: Secondary | ICD-10-CM

## 2013-03-29 DIAGNOSIS — N183 Chronic kidney disease, stage 3 unspecified: Secondary | ICD-10-CM

## 2013-03-29 MED ORDER — POTASSIUM CHLORIDE CRYS ER 20 MEQ PO TBCR: 20.0000 meq | EXTENDED_RELEASE_TABLET | Freq: Every day | ORAL | Status: DC

## 2013-03-29 MED ORDER — METOLAZONE 5 MG PO TABS: 5.0000 mg | ORAL_TABLET | Freq: Once | ORAL | Status: DC | PRN

## 2013-03-29 MED ORDER — TORSEMIDE 20 MG PO TABS: ORAL_TABLET | ORAL | Status: DC

## 2013-03-29 NOTE — Assessment & Plan Note (Signed)
Moderately controlled. No changes in antihypertensives until I see that he has diuresed, with changes in diuretics. Want to avoid hypotension.

## 2013-03-29 NOTE — Patient Instructions (Signed)
Your physician recommends that you schedule a follow-up appointment in: 2-3 weeks with Dr. Domenic Polite   Your physician recommends that you return for lab work in: [place] ([place] BMET)  [place] TEST RESULTS/INSTRUCTIONS/NEXT STEPS ONCE RECEIVED BY THE PROVIDER   Your physician has recommended you make the following change in your medication:   1) STOP TAKING LASIX 2) START TAKING TORSEMIDE 60MG  IN THE AM AND 40MG  IN THE PM (TAKE 40MG  TONIGHT WITH METOLAZONE 5MG ) 3) TAKE ONE TIME DOSAGE OF METOLAZONE 5MG  ONCE TONIGHT WITH TORSEMIDE 40MG  (EXTRA METOLAZONE SENT FOR POSSIBLE NEED LATER 4) START TAKING POTASSIUM 20MEQ ONCE DAILY  Your physician recommends THAT YOU CALL OUR OFFICE ON Thursday 04-01-13 AT [id number] TO ADVISE IF YOUR SWELLING OR WEIGHT HAS WENT DOWN  Your physician recommends THAT YOU IMPLEMENT A LOW SODIUM DIET OF 1500MG /1.5GRAMS DAILY LISTED BELOW   1.5 Gram Low Sodium Diet A 1.5 gram sodium diet restricts the amount of salt in your diet. You can have no more than 1.5 grams (1500 miligrams) in 1 day. This can help lessen your risk for developing high blood pressure. This diet may also reduce your chance of having a heart attack or stroke. It is important that you know what to look for when choosing foods and drinks.  HOME CARE   Do not add salt to food.  Avoid convenience items and fast food.  Choose unsalted snack foods.  Buy products labeled "low sodium" or "no salt added" when possible.  Check food labels to learn how much sodium is in 1 serving.  When eating at a restaurant, ask that your food be prepared with less salt or none, if possible. The nutrition facts label is a good place to find how much sodium is in foods. Look for products with no more than 400 mg of sodium per serving. Remember that 1.5 g = 1500 mg. CHOOSING FOODS Grains  Avoid: Salted crackers and snack items. Some cereals, including instant hot cereals. Bread stuffing  and biscuit mixes. Seasoned rice or pasta mixes.  Choose: Unsalted snack items. Low-sodium cereals, oats, puffed wheat and rice, shredded wheat. English muffins and bread. Pasta. Meats  Avoid:  Salted, canned, smoked, spiced, pickled meats, including fish and poultry. Bacon, ham, sausage, cold cuts, hot dogs, anchovies.  Choose: Low-sodium canned tuna and salmon. Fresh or frozen meat, poultry, and fish. Dairy  Avoid: Processed cheese and spreads. Cottage cheese. Buttermilk and condensed milk. Regular cheese.  Choose:  Milk. Low-sodium cottage cheese. Yogurt. Sour cream. Low-sodium cheese. Fruits and Vegetables  Avoid:  Regular canned vegetables. Regular canned tomato sauce and paste. Frozen vegetables in sauces. Olives. Angie Fava. Relishes. Sauerkraut.  Choose:  Low-sodium canned vegetables. Low-sodium tomato sauce and paste. Frozen or fresh vegetables. Fresh and frozen fruit. Condiments  Avoid:  Canned and packaged gravies. Worcestershire sauce. Tartar sauce. Barbecue sauce. Soy sauce. Steak sauce. Ketchup. Onion, garlic, and table salt. Meat flavorings and tenderizers.  Choose:  Fresh and dried herbs and spices. Low-sodium varieties of mustard and ketchup. Lemon juice. Tabasco sauce. Horseradish. Document Released: 03/02/2010 Document Revised: 04/22/2011 Document Reviewed: 03/02/2010 [facility] Patient Information 2014 [place], [place].

## 2013-03-29 NOTE — Progress Notes (Deleted)
Name: Lance [name]    DOB: 1970-11-27  Age: 43 y.o.  MR#: [id number]       PCP:  [name], MD      Insurance: Payor: [place] / Plan: BCBS PPO OUT OF STATE / Product Type: *No Product type* /   CC:    Chief Complaint  Patient presents with  . Edema  . Hypertension    VS Filed Vitals:   03/29/13 1211  BP: 151/75  Pulse: 94  Height: 6\' 3"  (1.905 m)  Weight: 245 lb 8 oz (111.358 kg)    Weights Current Weight  03/29/13 245 lb 8 oz (111.358 kg)  03/26/13 247 lb (112.038 kg)  02/08/13 237 lb (107.502 kg)    Blood Pressure  BP Readings from Last 3 Encounters:  03/29/13 151/75  03/26/13 158/82  02/08/13 142/70     Admit date:  (Not on file) Last encounter with RMR:  03/26/2013   Allergy Atorvastatin and Minoxidil  Current Outpatient Prescriptions  Medication Sig Dispense Refill  . amLODipine (NORVASC) 10 MG tablet TAKE 1 TABLET EVERY DAY  90 tablet  6  . aspirin EC 81 MG tablet Take 81 mg by mouth daily.      . carvedilol (COREG) 25 MG tablet TAKE 2 TABLETS (50 MG TOTAL) BY MOUTH 2 (TWO) TIMES DAILY.  120 tablet  7  . cloNIDine (CATAPRES) 0.2 MG tablet TAKE 1 TABLET IN THE EVENING  30 tablet  6  . furosemide (LASIX) 80 MG tablet Take 1 tablet (80 mg total) by mouth 2 (two) times daily.      . hydrALAZINE (APRESOLINE) 50 MG tablet TAKE 1 TABLET (50 MG TOTAL) BY MOUTH 2 (TWO) TIMES DAILY.  180 tablet  0  . insulin lispro protamine-insulin lispro (HUMALOG 75/25) (75-25) 100 UNIT/ML SUSP Inject 58-80 Units into the skin daily. 80 -150  58 units 150-300  68 units 300-400  80 units       . insulin NPH-insulin regular (NOVOLIN 50/50) (50-50) 100 UNIT/ML injection Inject 30 Units into the skin daily before supper.        . losartan-hydrochlorothiazide (HYZAAR) 100-25 MG per tablet TAKE 1 TABLET BY MOUTH DAILY.  30 tablet  9  . rosuvastatin (CRESTOR) 40 MG tablet Take 40 mg by mouth 2 (two) times a week.      . verapamil (COVERA HS) 240 MG (CO) 24 hr tablet Take 1  tablet (240 mg total) by mouth at bedtime.  90 tablet  1   No current facility-administered medications for this visit.    Discontinued Meds:   There are no discontinued medications.  Patient Active Problem List   Diagnosis Date Noted  . Bilateral leg edema 03/26/2013  . Chronic kidney disease, stage III (moderate) 09/29/2011  . Diabetes mellitus type 1 09/26/2010  . Hypertension, malignant 09/26/2010  . Hyperlipidemia-severe 09/26/2010    LABS    Component Value Date/Time   NA 134* 03/26/2013 1520   NA 128* 12/22/2012 0757   NA 137 02/24/2012 1615   K 4.5 03/26/2013 1520   K 4.6 12/22/2012 0757   K 3.9 02/24/2012 1615   CL 103 03/26/2013 1520   CL 98 12/22/2012 0757   CL 103 02/24/2012 1615   CO2 23 03/26/2013 1520   CO2 21 12/22/2012 0757   CO2 25 02/24/2012 1615   GLUCOSE 266* 03/26/2013 1520   GLUCOSE 482* 12/22/2012 0757   GLUCOSE 81 02/24/2012 1615   BUN 46* 03/26/2013 1520  BUN 38* 12/22/2012 0757   BUN 39* 02/24/2012 1615   CREATININE 2.21* 03/26/2013 1520   CREATININE 2.16* 12/22/2012 0757   CREATININE 1.94* 02/24/2012 1615   CREATININE 1.92* 12/31/2011 0800   CREATININE 1.19 09/27/2010 0436   CREATININE 1.34 09/26/2010 0646   CALCIUM 8.5 03/26/2013 1520   CALCIUM 8.8 12/22/2012 0757   CALCIUM 8.4 02/24/2012 1615   GFRNONAA 36* 12/22/2012 0757   GFRNONAA >60 09/27/2010 0436   GFRNONAA 59* 09/26/2010 0646   GFRAA 41* 12/22/2012 0757   GFRAA >60 09/27/2010 0436   GFRAA >60 09/26/2010 0646   CMP     Component Value Date/Time   NA 134* 03/26/2013 1520   K 4.5 03/26/2013 1520   CL 103 03/26/2013 1520   CO2 23 03/26/2013 1520   GLUCOSE 266* 03/26/2013 1520   BUN 46* 03/26/2013 1520   CREATININE 2.21* 03/26/2013 1520   CREATININE 2.16* 12/22/2012 0757   CALCIUM 8.5 03/26/2013 1520   PROT 5.9* 12/31/2011 0800   ALBUMIN 2.9* 12/31/2011 0800   AST 10 12/31/2011 0800   ALT 9 12/31/2011 0800   ALKPHOS 70 12/31/2011 0800   BILITOT 0.4 12/31/2011 0800   GFRNONAA 36* 12/22/2012  0757   GFRAA 41* 12/22/2012 0757       Component Value Date/Time   WBC 8.0 12/22/2012 0757   WBC 6.8 09/27/2010 0436   WBC 9.0 09/26/2010 0646   HGB 12.4* 12/22/2012 0757   HGB 13.3 09/27/2010 0436   HGB 15.0 09/26/2010 0646   HCT 36.1* 12/22/2012 0757   HCT 38.5* 09/27/2010 0436   HCT 42.6 09/26/2010 0646   MCV 91.6 12/22/2012 0757   MCV 87.3 09/27/2010 0436   MCV 86.4 09/26/2010 0646    Lipid Panel     Component Value Date/Time   CHOL 398* 02/24/2012 1615   TRIG 431* 02/24/2012 1615   HDL 80 02/24/2012 1615   CHOLHDL 5.0 02/24/2012 1615   VLDL NOT CALC 02/24/2012 1615   LDLCALC Comment:   Not calculated due to Triglyceride >400. Suggest ordering Direct LDL (Unit Code: [phone]).   Total Cholesterol/HDL Ratio:CHD Risk                        Coronary Heart Disease Risk Table                                        Men       Women          1/2 Average Risk              3.4        3.3              Average Risk              5.0        4.4           2X Average Risk              9.6        7.1           3X Average Risk             23.4       11.0 Use the calculated Patient Ratio above and the CHD Risk table  to determine the patient's CHD Risk. ATP III Classification (LDL):       <  100        mg/dL         Optimal      100 - 129     mg/dL         Near or Above Optimal      130 - 159     mg/dL         Borderline High      160 - 189     mg/dL         High       > 190        mg/dL         Very High   02/24/2012 1615    ABG No results found for this basename: phart, pco2, pco2art, po2, po2art, hco3, tco2, acidbasedef, o2sat     No results found for this basename: TSH   BNP (last 3 results)  Recent Labs  12/22/12 0757  PROBNP 416.8*   Cardiac Panel (last 3 results) No results found for this basename: CKTOTAL, CKMB, TROPONINI, RELINDX,  in the last 72 hours  Iron/TIBC/Ferritin No results found for this basename: iron, tibc, ferritin     EKG Orders placed during the hospital encounter of 12/22/12   . ED EKG  . ED EKG  . EKG 12-LEAD  . EKG 12-LEAD  . EKG     Prior Assessment and Plan Problem List as of 03/29/2013   Diabetes mellitus type 1   Last Assessment & Plan   12/03/2012 Office Visit Written 12/03/2012  4:50 PM by Lendon Colonel, NP     Followup with primary care for continued management.    Hypertension, malignant   Last Assessment & Plan   03/26/2013 Office Visit Edited 03/26/2013  3:28 PM by Lendon Colonel, NP     Blood pressure is moderately elevated today. He continues on amlodipine, carvedilol, clonidine, verapamil, hydralazine, and losartan HCTZ. I have advised him on a low-sodium diet. These medications can cause some lower extremity edema. As stated, a BMET will be drawn, and he is referred to nephrology.    Hyperlipidemia-severe   Last Assessment & Plan   02/08/2013 Office Visit Written 02/08/2013  3:19 PM by Lendon Colonel, NP     He is followed by PCP for this. Remains on crestor.     Chronic kidney disease, stage III (moderate)   Last Assessment & Plan   03/26/2013 Office Visit Written 03/26/2013  3:26 PM by Lendon Colonel, NP     I have referred him to Dr. Lowanda Foster, nephrologist, for ongoing assessment and management of his chronic kidney disease. He has not yet been est. with the nephrologist. I feel that this would be very important for him to have a kidney specialist to follow him closely with further recommendations. He verbalizes understanding and is willing to followup with Dr. Lowanda Foster    Bilateral leg edema   Last Assessment & Plan   03/26/2013 Office Visit Written 03/26/2013  3:25 PM by Lendon Colonel, NP     On assessment, the patient does have some lower extremity edema, which she states is worse over the last several days. He increased his Lasix to 80 mg twice a day but has not found any relief. He denies any dietary noncompliance, but is vague about what he is eating.  I have advised him to continue to take the Lasix 80 mg twice a  day, avoid salt intake, I considered giving him 1 to 2 doses of  metolazone however review of his labs shows that he has had some issues with hyponatremia in the past. I will check a BMET. He will followup with Dr. Harl Bowie next week for further assessment. May need to consider changing to torsemide for better bioavailability, or adding a couple days of metolazone should his labs allow.        Imaging: No results found.

## 2013-03-29 NOTE — Assessment & Plan Note (Signed)
Referral paperwork has been sent to Dr.Befakadu's office. Appt is to be scheduled.

## 2013-03-29 NOTE — Assessment & Plan Note (Signed)
He has not lost a significant amount of fluid or wt.  I will d/c lasix and change to torsemide 60 mg in am and 40 mg in pm. I will give one dose of metolazone 5 mg X 1. He will be placed on potassium 20 mEq daily. I will repeat his BMET in one week. He will see Dr, Domenic Polite in 2 weeks. He is given instructions on a 1500 mg sodium restricted diet.   He will call us on Thursday if he has not lost any wt or LEE continues or worsens.

## 2013-03-29 NOTE — Progress Notes (Signed)
HPI: Lance [name] is a 43 year old male patient to be est. with Dr. Harl Bowie we are seeing for ongoing assessment and management of malignant hypertension, history of chronic kidney disease stage III, hyperlipidemia. He comes today having gained 10 pounds, and complaining of lower extremity edema. He states that the edema has worsened. He denies any increase salt intake, but is vague about it.  I saw him in the office on 03/26/2013 with BMET and continued doses of lasix 80 mg BID. I considered adding one dose of metolazone but waited to do so due to previous history of hyponatremia. BMET completed on 03/26/2013 demonstrated sodium 134 potassium 4.5 chloride 103 CO2 23 BUN 46 creatinine 2.21 (most recent was 2.16). He is here for followup to evaluate weight and fluid retention.   He has only lost 2 lbs since Friday. He has eaten out this weekend, at [employer]. He continues to have LEE and now some mild abdominal distention.    Allergies  Allergen Reactions  . Atorvastatin Other (See Comments)    Myalgias  . Minoxidil Other (See Comments)    Fluid retention    Current Outpatient Prescriptions  Medication Sig Dispense Refill  . amLODipine (NORVASC) 10 MG tablet TAKE 1 TABLET EVERY DAY  90 tablet  6  . aspirin EC 81 MG tablet Take 81 mg by mouth daily.      . carvedilol (COREG) 25 MG tablet TAKE 2 TABLETS (50 MG TOTAL) BY MOUTH 2 (TWO) TIMES DAILY.  120 tablet  7  . cloNIDine (CATAPRES) 0.2 MG tablet TAKE 1 TABLET IN THE EVENING  30 tablet  6  . furosemide (LASIX) 80 MG tablet Take 1 tablet (80 mg total) by mouth 2 (two) times daily.      . hydrALAZINE (APRESOLINE) 50 MG tablet TAKE 1 TABLET (50 MG TOTAL) BY MOUTH 2 (TWO) TIMES DAILY.  180 tablet  0  . insulin lispro protamine-insulin lispro (HUMALOG 75/25) (75-25) 100 UNIT/ML SUSP Inject 58-80 Units into the skin daily. 80 -150  58 units 150-300  68 units 300-400  80 units       . insulin NPH-insulin regular (NOVOLIN 50/50) (50-50) 100 UNIT/ML  injection Inject 30 Units into the skin daily before supper.        . losartan-hydrochlorothiazide (HYZAAR) 100-25 MG per tablet TAKE 1 TABLET BY MOUTH DAILY.  30 tablet  9  . rosuvastatin (CRESTOR) 40 MG tablet Take 40 mg by mouth 2 (two) times a week.      . verapamil (COVERA HS) 240 MG (CO) 24 hr tablet Take 1 tablet (240 mg total) by mouth at bedtime.  90 tablet  1   No current facility-administered medications for this visit.    Past Medical History  Diagnosis Date  . Diabetes mellitus 1990    no insulin; A1c of 6.8 in 2009  . 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 Hyperlipidemia 2000  . Aortic stenosis     Very mild in 05/2007  . PSVT (paroxysmal supraventricular tachycardia)     Nonsustained; asymptomatic; diagnosed by event recorder in 2006  . Degenerative joint disease     S/p left TKA  . Tobacco abuse, in remission     20 pack years; discontinued in 1985; bronchitic changes on chest x-ray and 2009  . CHF (congestive heart failure)     Normal EF and 05/2007; attributed to Actos vs. diastolic dysfunction; chest x-ray and 2009- cardiomegaly and vascular redistribution    Past Surgical  History  Procedure Laterality Date  . Total knee arthroplasty      Left  . Lumbar spine surgery      Multiple procedures    [id number] of systems complete and found to be negative unless listed above  PHYSICAL EXAM BP 151/75  Pulse 94  Ht 6\' 3"  (1.905 m)  Wt 245 lb 8 oz (111.358 kg)  BMI 30.69 kg/m2  General: Well developed, well nourished, in no acute distress Head: Eyes PERRLA, No xanthomas.   Normal cephalic and atramatic  Lungs: Clear bilaterally to auscultation and percussion. Heart: HRRR S1 S2, without MRG.  Pulses are 2+ & equal.            No carotid bruit. No JVD.  No abdominal bruits. No femoral bruits. Abdomen: Bowel sounds are positive, abdomen mildly distended, and non-tender without masses or                  Hernia's noted. Msk:  Back normal, normal gait. Normal strength and  tone for age. Extremities: No clubbing, cyanosis 2+ pitting edema, L>R to the knees and 1+ mid-thigh of the left leg..  DP +1 Neuro: Alert and oriented X 3. Psych:  Good affect, responds appropriately    ASSESSMENT AND PLAN

## 2013-04-14 ENCOUNTER — Encounter: Payer: Self-pay | Admitting: Cardiology

## 2013-04-14 ENCOUNTER — Ambulatory Visit (INDEPENDENT_AMBULATORY_CARE_PROVIDER_SITE_OTHER): Payer: BC Managed Care – PPO | Admitting: Cardiology

## 2013-04-14 VITALS — BP 132/79 | HR 77 | Ht 75.0 in | Wt 242.8 lb

## 2013-04-14 DIAGNOSIS — N183 Chronic kidney disease, stage 3 unspecified: Secondary | ICD-10-CM

## 2013-04-14 DIAGNOSIS — I5032 Chronic diastolic (congestive) heart failure: Secondary | ICD-10-CM

## 2013-04-14 DIAGNOSIS — I10 Essential (primary) hypertension: Secondary | ICD-10-CM

## 2013-04-14 DIAGNOSIS — I471 Supraventricular tachycardia: Secondary | ICD-10-CM

## 2013-04-14 NOTE — Assessment & Plan Note (Signed)
He has had improvement in leg edema with decrease in weight following switch from Lasix to Demadex. Need to have followup BMET to reassess potassium and renal function. No changes being made today, may need to adjust based on lab work. I encouraged him to weigh daily, we discussed fluid and sodium restriction parameters.

## 2013-04-14 NOTE — Progress Notes (Signed)
Clinical Summary Lance [name] is a 43 y.o.male presenting for an office visit. He has been followed Dr. Lattie [name] as well as [name] NP, just recently seen on February 16. This is my first meeting with him in the office. I reviewed his records, he is been managed recently with evidence of weight gain and fluid overload. He was changed from Lasix to [facility] and placed on potassium supplements by [name] NP at the last visit. No followup lab work as yet. He was also scheduled to see Dr. Lowanda [name].  He tells me that he is doing much better. By his home scales he has lost 8 pounds, states that his weight was 138 pounds most recently unclothed. Reports improved leg edema. He tells that he will be seeing Dr. Lowanda [name] in early April.  Recent lab work from February 13 showed sodium 134, potassium 4.5, BUN 46, creatinine 2.2. His weight is down an additional 3 pounds since visit in February.  Last echocardiogram was in August 2012 demonstrating mild LVH with LVEF [id number], grade 2 diastolic dysfunction, mildly calcified aortic valve, mild mitral regurgitation, left atrial enlargement, trivial tricuspid regurgitation.   Allergies  Allergen Reactions  . Atorvastatin Other (See Comments)    Myalgias  . Minoxidil Other (See Comments)    Fluid retention    Current Outpatient Prescriptions  Medication Sig Dispense Refill  . amLODipine (NORVASC) 10 MG tablet TAKE 1 TABLET EVERY DAY  90 tablet  6  . aspirin EC 81 MG tablet Take 81 mg by mouth daily.      . carvedilol (COREG) 25 MG tablet TAKE 2 TABLETS (50 MG TOTAL) BY MOUTH 2 (TWO) TIMES DAILY.  120 tablet  7  . cloNIDine (CATAPRES) 0.2 MG tablet TAKE 1 TABLET IN THE EVENING  30 tablet  6  . furosemide (LASIX) 80 MG tablet Take 1 tablet (80 mg total) by mouth 2 (two) times daily.      . hydrALAZINE (APRESOLINE) 50 MG tablet TAKE 1 TABLET (50 MG TOTAL) BY MOUTH 2 (TWO) TIMES DAILY.  180 tablet  0  . insulin lispro protamine-insulin lispro (HUMALOG  75/25) (75-25) 100 UNIT/ML SUSP Inject 58-80 Units into the skin daily. 80 -150  58 units 150-300  68 units 300-400  80 units       . insulin NPH-insulin regular (NOVOLIN 50/50) (50-50) 100 UNIT/ML injection Inject 30 Units into the skin daily before supper.        . losartan-hydrochlorothiazide (HYZAAR) 100-25 MG per tablet TAKE 1 TABLET BY MOUTH DAILY.  30 tablet  9  . metolazone (ZAROXOLYN) 5 MG tablet Take 1 tablet (5 mg total) by mouth once as needed.  5 tablet  0  . potassium chloride SA (K-DUR,KLOR-CON) 20 MEQ tablet Take 1 tablet (20 mEq total) by mouth daily.  30 tablet  6  . rosuvastatin (CRESTOR) 40 MG tablet Take 40 mg by mouth 2 (two) times a week.      . torsemide (DEMADEX) 20 MG tablet TAKE 60MG  IN THE AM AND 40MG  IN THE PM  150 tablet  6  . verapamil (COVERA HS) 240 MG (CO) 24 hr tablet Take 1 tablet (240 mg total) by mouth at bedtime.  90 tablet  1   No current facility-administered medications for this visit.    Past Medical History  Diagnosis Date  . Type 1 diabetes mellitus 1990  . Essential hypertension, benign 2000    LVH  . Hyperlipidemia 2000  . Aortic stenosis  Very mild in 05/2007  . PSVT (paroxysmal supraventricular tachycardia)     Nonsustained; asymptomatic; diagnosed by event recorder in 2006  . Degenerative joint disease     Left TKA  . Tobacco abuse, in remission     20 pack years; discontinued in 1985; bronchitic changes on chest x-ray and 2009  . Diastolic heart failure     LVEF 65-70% with grade 2 diastolic dysfunction  . CKD (chronic kidney disease) stage 3, GFR 30-59 ml/min     Social History Lance [name] reports that he has never smoked. He does not have any smokeless tobacco history on file. Lance [name] reports that he drinks alcohol.  Review of Systems Reports no palpitations or chest pain. Stable NYHA class II dyspnea. Continues to work full-time at [employer]. Otherwise negative.  Physical Examination Filed Vitals:   04/14/13 0823  BP:  132/79  Pulse: 77   Filed Weights   04/14/13 0823  Weight: 242 lb 12.8 oz (110.133 kg)   Overweight male, appears comfortable at rest. HEENT: Conjunctiva and lids normal, oropharynx clear. Neck: Supple, no elevated JVP or carotid bruits, no thyromegaly. Lungs: Clear to auscultation, nonlabored breathing at rest. Cardiac: Regular rate and rhythm, no S3, soft systolic murmur, no pericardial rub. Abdomen: Soft, nontender, bowel sounds present, no guarding or rebound. Extremities: 1+ edema - appears chronic with thick skin, distal pulses 2+. Skin: Warm and dry. Musculoskeletal: No kyphosis. Neuropsychiatric: Alert and oriented x3, affect grossly appropriate.   Problem List and Plan   Chronic diastolic heart failure He has had improvement in leg edema with decrease in weight following switch from Lasix to Demadex. Need to have followup BMET to reassess potassium and renal function. No changes being made today, may need to adjust based on lab work. I encouraged him to weigh daily, we discussed fluid and sodium restriction parameters.  Essential hypertension, benign Blood pressure control is actually fairly good based on review of his history. No changes made to current regimen.  PSVT (paroxysmal supraventricular tachycardia) No complaints of palpitations or obvious recurrences.  Chronic kidney disease, stage III (moderate) Most recent creatinine 2.2. Followup BMET pending. Patient has visit with Dr. Lowanda [name] in April.    Satira Sark, M.D., F.A.C.C.

## 2013-04-14 NOTE — Patient Instructions (Signed)
Your physician recommends that you schedule a follow-up appointment in: 6 months with Dr Ferne Reus will receive a reminder letter two months in advance reminding you to call and schedule your appointment. If you don't receive this letter, please contact our office.  Your physician recommends that you return for lab work today. BMET

## 2013-04-14 NOTE — Assessment & Plan Note (Signed)
Most recent creatinine 2.2. Followup BMET pending. Patient has visit with Dr. Lowanda Foster in April.

## 2013-04-14 NOTE — Assessment & Plan Note (Signed)
No complaints of palpitations or obvious recurrences.

## 2013-04-14 NOTE — Assessment & Plan Note (Signed)
Blood pressure control is actually fairly good based on review of his history. No changes made to current regimen.

## 2013-05-10 ENCOUNTER — Other Ambulatory Visit: Payer: Self-pay

## 2013-05-10 MED ORDER — LOSARTAN POTASSIUM-HCTZ 100-25 MG PO TABS: ORAL_TABLET | ORAL | Status: DC

## 2013-05-23 ENCOUNTER — Other Ambulatory Visit: Payer: Self-pay | Admitting: Adult Health

## 2013-05-24 ENCOUNTER — Other Ambulatory Visit (HOSPITAL_COMMUNITY): Payer: Self-pay | Admitting: Nephrology

## 2013-05-24 DIAGNOSIS — N289 Disorder of kidney and ureter, unspecified: Secondary | ICD-10-CM

## 2013-06-07 ENCOUNTER — Ambulatory Visit (HOSPITAL_COMMUNITY): Payer: BC Managed Care – PPO | Attending: Nephrology

## 2013-08-19 ENCOUNTER — Other Ambulatory Visit: Payer: Self-pay | Admitting: Adult Health

## 2013-12-23 ENCOUNTER — Inpatient Hospital Stay (HOSPITAL_COMMUNITY)
Admission: EM | Admit: 2013-12-23 | Discharge: 2013-12-29 | DRG: 871 | Disposition: A | Discharge status: Home / Self Care | Payer: BC Managed Care – PPO | Attending: Internal Medicine | Admitting: Internal Medicine

## 2013-12-23 ENCOUNTER — Emergency Department (HOSPITAL_COMMUNITY): Payer: BC Managed Care – PPO

## 2013-12-23 ENCOUNTER — Encounter (HOSPITAL_COMMUNITY): Payer: Self-pay | Admitting: *Deleted

## 2013-12-23 ENCOUNTER — Ambulatory Visit: Payer: Self-pay | Admitting: Family Medicine

## 2013-12-23 DIAGNOSIS — N185 Chronic kidney disease, stage 5: Secondary | ICD-10-CM | POA: Diagnosis present

## 2013-12-23 DIAGNOSIS — E1110 Type 2 diabetes mellitus with ketoacidosis without coma: Secondary | ICD-10-CM | POA: Diagnosis present

## 2013-12-23 DIAGNOSIS — I129 Hypertensive chronic kidney disease with stage 1 through stage 4 chronic kidney disease, or unspecified chronic kidney disease: Secondary | ICD-10-CM | POA: Diagnosis present

## 2013-12-23 DIAGNOSIS — E10649 Type 1 diabetes mellitus with hypoglycemia without coma: Secondary | ICD-10-CM | POA: Diagnosis present

## 2013-12-23 DIAGNOSIS — R748 Abnormal levels of other serum enzymes: Secondary | ICD-10-CM | POA: Diagnosis present

## 2013-12-23 DIAGNOSIS — A419 Sepsis, unspecified organism: Principal | ICD-10-CM | POA: Diagnosis present

## 2013-12-23 DIAGNOSIS — E871 Hypo-osmolality and hyponatremia: Secondary | ICD-10-CM | POA: Diagnosis present

## 2013-12-23 DIAGNOSIS — E162 Hypoglycemia, unspecified: Secondary | ICD-10-CM | POA: Diagnosis not present

## 2013-12-23 DIAGNOSIS — I5033 Acute on chronic diastolic (congestive) heart failure: Secondary | ICD-10-CM | POA: Diagnosis present

## 2013-12-23 DIAGNOSIS — E1010 Type 1 diabetes mellitus with ketoacidosis without coma: Secondary | ICD-10-CM | POA: Diagnosis present

## 2013-12-23 DIAGNOSIS — I248 Other forms of acute ischemic heart disease: Secondary | ICD-10-CM | POA: Diagnosis present

## 2013-12-23 DIAGNOSIS — E872 Acidosis, unspecified: Secondary | ICD-10-CM | POA: Diagnosis present

## 2013-12-23 DIAGNOSIS — Z87891 Personal history of nicotine dependence: Secondary | ICD-10-CM | POA: Diagnosis not present

## 2013-12-23 DIAGNOSIS — E785 Hyperlipidemia, unspecified: Secondary | ICD-10-CM | POA: Diagnosis present

## 2013-12-23 DIAGNOSIS — I5032 Chronic diastolic (congestive) heart failure: Secondary | ICD-10-CM | POA: Diagnosis present

## 2013-12-23 DIAGNOSIS — J189 Pneumonia, unspecified organism: Secondary | ICD-10-CM | POA: Diagnosis present

## 2013-12-23 DIAGNOSIS — N179 Acute kidney failure, unspecified: Secondary | ICD-10-CM | POA: Diagnosis present

## 2013-12-23 DIAGNOSIS — I5031 Acute diastolic (congestive) heart failure: Secondary | ICD-10-CM | POA: Diagnosis not present

## 2013-12-23 DIAGNOSIS — R05 Cough: Secondary | ICD-10-CM | POA: Diagnosis not present

## 2013-12-23 DIAGNOSIS — I10 Essential (primary) hypertension: Secondary | ICD-10-CM | POA: Diagnosis present

## 2013-12-23 DIAGNOSIS — Z6829 Body mass index (BMI) 29.0-29.9, adult: Secondary | ICD-10-CM

## 2013-12-23 DIAGNOSIS — I350 Nonrheumatic aortic (valve) stenosis: Secondary | ICD-10-CM | POA: Diagnosis present

## 2013-12-23 DIAGNOSIS — I2510 Atherosclerotic heart disease of native coronary artery without angina pectoris: Secondary | ICD-10-CM | POA: Diagnosis present

## 2013-12-23 DIAGNOSIS — E669 Obesity, unspecified: Secondary | ICD-10-CM | POA: Diagnosis present

## 2013-12-23 DIAGNOSIS — D649 Anemia, unspecified: Secondary | ICD-10-CM | POA: Diagnosis present

## 2013-12-23 DIAGNOSIS — R0600 Dyspnea, unspecified: Secondary | ICD-10-CM | POA: Insufficient documentation

## 2013-12-23 DIAGNOSIS — I120 Hypertensive chronic kidney disease with stage 5 chronic kidney disease or end stage renal disease: Secondary | ICD-10-CM | POA: Diagnosis present

## 2013-12-23 DIAGNOSIS — Z794 Long term (current) use of insulin: Secondary | ICD-10-CM | POA: Diagnosis not present

## 2013-12-23 DIAGNOSIS — Z9119 Patient's noncompliance with other medical treatment and regimen: Secondary | ICD-10-CM | POA: Diagnosis present

## 2013-12-23 DIAGNOSIS — N049 Nephrotic syndrome with unspecified morphologic changes: Secondary | ICD-10-CM

## 2013-12-23 DIAGNOSIS — R059 Cough, unspecified: Secondary | ICD-10-CM

## 2013-12-23 LAB — BASIC METABOLIC PANEL
ANION GAP: 19 — AB (ref 5–15)
ANION GAP: 14 (ref 5–15)
ANION GAP: 12 (ref 5–15)
Anion gap: 24 — ABNORMAL HIGH (ref 5–15)
BUN: 43 mg/dL — ABNORMAL HIGH (ref 6–23)
BUN: 43 mg/dL — ABNORMAL HIGH (ref 6–23)
BUN: 42 mg/dL — ABNORMAL HIGH (ref 6–23)
BUN: 42 mg/dL — ABNORMAL HIGH (ref 6–23)
CALCIUM: 8.4 mg/dL (ref 8.4–10.5)
CALCIUM: 8.3 mg/dL — AB (ref 8.4–10.5)
CALCIUM: 8.3 mg/dL — AB (ref 8.4–10.5)
CO2: 13 mEq/L — ABNORMAL LOW (ref 19–32)
CO2: 16 mEq/L — ABNORMAL LOW (ref 19–32)
CO2: 19 meq/L (ref 19–32)
CO2: 20 meq/L (ref 19–32)
CREATININE: 3.33 mg/dL — AB (ref 0.50–1.35)
Calcium: 8.2 mg/dL — ABNORMAL LOW (ref 8.4–10.5)
Chloride: 92 mEq/L — ABNORMAL LOW (ref 96–112)
Chloride: 98 mEq/L (ref 96–112)
Chloride: 103 mEq/L (ref 96–112)
Chloride: 105 mEq/L (ref 96–112)
Creatinine, Ser: 3.4 mg/dL — ABNORMAL HIGH (ref 0.50–1.35)
Creatinine, Ser: 3.62 mg/dL — ABNORMAL HIGH (ref 0.50–1.35)
Creatinine, Ser: 3.73 mg/dL — ABNORMAL HIGH (ref 0.50–1.35)
GFR calc Af Amer: 24 mL/min — ABNORMAL LOW (ref >90)
GFR calc Af Amer: 24 mL/min — ABNORMAL LOW (ref >90)
GFR calc Af Amer: 22 mL/min — ABNORMAL LOW (ref >90)
GFR calc Af Amer: 21 mL/min — ABNORMAL LOW (ref >90)
GFR calc non Af Amer: 21 mL/min — ABNORMAL LOW (ref >90)
GFR calc non Af Amer: 20 mL/min — ABNORMAL LOW (ref >90)
GFR, EST NON AFRICAN AMERICAN: 19 mL/min — AB (ref >90)
GFR, EST NON AFRICAN AMERICAN: 18 mL/min — AB (ref >90)
GLUCOSE: 661 mg/dL — AB (ref 70–99)
GLUCOSE: 116 mg/dL — AB (ref 70–99)
Glucose, Bld: 438 mg/dL — ABNORMAL HIGH (ref 70–99)
Glucose, Bld: 215 mg/dL — ABNORMAL HIGH (ref 70–99)
POTASSIUM: 4.3 meq/L (ref 3.7–5.3)
POTASSIUM: 4 meq/L (ref 3.7–5.3)
Potassium: 5 mEq/L (ref 3.7–5.3)
Potassium: 4 mEq/L (ref 3.7–5.3)
SODIUM: 133 meq/L — AB (ref 137–147)
SODIUM: 136 meq/L — AB (ref 137–147)
SODIUM: 137 meq/L (ref 137–147)
Sodium: 129 mEq/L — ABNORMAL LOW (ref 137–147)

## 2013-12-23 LAB — CBG MONITORING, ED

## 2013-12-23 LAB — CBC WITH DIFFERENTIAL/PLATELET
BASOS PCT: 0 % (ref 0–1)
Basophils Absolute: 0 10*3/uL (ref 0.0–0.1)
EOS PCT: 0 % (ref 0–5)
Eosinophils Absolute: 0 10*3/uL (ref 0.0–0.7)
HCT: 38.6 % — ABNORMAL LOW (ref 39.0–52.0)
Hemoglobin: 12.7 g/dL — ABNORMAL LOW (ref 13.0–17.0)
Lymphocytes Relative: 6 % — ABNORMAL LOW (ref 12–46)
Lymphs Abs: 0.9 10*3/uL (ref 0.7–4.0)
MCH: 31.4 pg (ref 26.0–34.0)
MCHC: 32.9 g/dL (ref 30.0–36.0)
MCV: 95.3 fL (ref 78.0–100.0)
MONO ABS: 0.9 10*3/uL (ref 0.1–1.0)
Monocytes Relative: 7 % (ref 3–12)
Neutro Abs: 11.6 10*3/uL — ABNORMAL HIGH (ref 1.7–7.7)
Neutrophils Relative %: 87 % — ABNORMAL HIGH (ref 43–77)
PLATELETS: 252 10*3/uL (ref 150–400)
RBC: 4.05 MIL/uL — ABNORMAL LOW (ref 4.22–5.81)
RDW: 13.3 % (ref 11.5–15.5)
WBC: 13.3 10*3/uL — ABNORMAL HIGH (ref 4.0–10.5)

## 2013-12-23 LAB — CBC
HCT: 36.6 % — ABNORMAL LOW (ref 39.0–52.0)
Hemoglobin: 12.2 g/dL — ABNORMAL LOW (ref 13.0–17.0)
MCH: 30.7 pg (ref 26.0–34.0)
MCHC: 33.3 g/dL (ref 30.0–36.0)
MCV: 92 fL (ref 78.0–100.0)
Platelets: 278 10*3/uL (ref 150–400)
RBC: 3.98 MIL/uL — ABNORMAL LOW (ref 4.22–5.81)
RDW: 13.2 % (ref 11.5–15.5)
WBC: 14.8 10*3/uL — ABNORMAL HIGH (ref 4.0–10.5)

## 2013-12-23 LAB — I-STAT TROPONIN, ED: Troponin i, poc: 0.13 ng/mL (ref 0.00–0.08)

## 2013-12-23 LAB — MRSA PCR SCREENING: MRSA by PCR: NEGATIVE

## 2013-12-23 LAB — TROPONIN I

## 2013-12-23 LAB — PRO B NATRIURETIC PEPTIDE: PRO B NATRI PEPTIDE: 6963 pg/mL — AB (ref 0–125)

## 2013-12-23 LAB — GLUCOSE, CAPILLARY
GLUCOSE-CAPILLARY: 181 mg/dL — AB (ref 70–99)
GLUCOSE-CAPILLARY: 90 mg/dL (ref 70–99)
Glucose-Capillary: 497 mg/dL — ABNORMAL HIGH (ref 70–99)
Glucose-Capillary: 374 mg/dL — ABNORMAL HIGH (ref 70–99)
Glucose-Capillary: 241 mg/dL — ABNORMAL HIGH (ref 70–99)
Glucose-Capillary: 124 mg/dL — ABNORMAL HIGH (ref 70–99)

## 2013-12-23 MED ORDER — SODIUM CHLORIDE 0.9 % IV SOLN
INTRAVENOUS | Status: DC
  Administered 2013-12-23: 5.4 [IU]/h via INTRAVENOUS
  Filled 2013-12-23: qty 2.5

## 2013-12-23 MED ORDER — PNEUMOCOCCAL VAC POLYVALENT 25 MCG/0.5ML IJ INJ: 0.5000 mL | INJECTION | INTRAMUSCULAR | Status: DC | PRN

## 2013-12-23 MED ORDER — INFLUENZA VAC SPLIT QUAD 0.5 ML IM SUSY: 0.5000 mL | PREFILLED_SYRINGE | INTRAMUSCULAR | Status: DC | PRN

## 2013-12-23 MED ORDER — ONDANSETRON HCL 4 MG/2ML IJ SOLN
4.0000 mg | Freq: Once | INTRAMUSCULAR | Status: AC
  Administered 2013-12-23: 4 mg via INTRAVENOUS
  Filled 2013-12-23: qty 2

## 2013-12-23 MED ORDER — ONDANSETRON HCL 4 MG PO TABS: 4.0000 mg | ORAL_TABLET | Freq: Four times a day (QID) | ORAL | Status: DC | PRN

## 2013-12-23 MED ORDER — CLONIDINE HCL 0.2 MG PO TABS
0.2000 mg | ORAL_TABLET | Freq: Every day | ORAL | Status: DC
  Administered 2013-12-23 – 2013-12-24 (×2): 0.2 mg via ORAL
  Filled 2013-12-23 (×3): qty 1

## 2013-12-23 MED ORDER — ONDANSETRON HCL 4 MG/2ML IJ SOLN: 4.0000 mg | Freq: Four times a day (QID) | INTRAMUSCULAR | Status: DC | PRN

## 2013-12-23 MED ORDER — HYDRALAZINE HCL 50 MG PO TABS
50.0000 mg | ORAL_TABLET | Freq: Two times a day (BID) | ORAL | Status: DC
  Administered 2013-12-24 (×2): 50 mg via ORAL
  Filled 2013-12-23 (×5): qty 1

## 2013-12-23 MED ORDER — DEXTROSE 50 % IV SOLN: 25.0000 mL | INTRAVENOUS | Status: DC | PRN

## 2013-12-23 MED ORDER — DEXTROSE 5 % IV SOLN
500.0000 mg | Freq: Once | INTRAVENOUS | Status: AC
  Administered 2013-12-23: 500 mg via INTRAVENOUS
  Filled 2013-12-23: qty 500

## 2013-12-23 MED ORDER — VERAPAMIL HCL ER 240 MG PO TBCR
240.0000 mg | EXTENDED_RELEASE_TABLET | Freq: Every day | ORAL | Status: DC
  Administered 2013-12-23 – 2013-12-27 (×5): 240 mg via ORAL
  Filled 2013-12-23 (×6): qty 1

## 2013-12-23 MED ORDER — SODIUM CHLORIDE 0.9 % IV SOLN
INTRAVENOUS | Status: DC
  Administered 2013-12-23: 20:00:00 via INTRAVENOUS
  Administered 2013-12-23: 100 mL/h via INTRAVENOUS

## 2013-12-23 MED ORDER — SODIUM CHLORIDE 0.9 % IV BOLUS (SEPSIS)
1000.0000 mL | Freq: Once | INTRAVENOUS | Status: AC
  Administered 2013-12-23: 1000 mL via INTRAVENOUS

## 2013-12-23 MED ORDER — DEXTROSE 5 % IV SOLN
1.0000 g | INTRAVENOUS | Status: DC
  Administered 2013-12-24 – 2013-12-26 (×3): 1 g via INTRAVENOUS
  Filled 2013-12-23 (×5): qty 10

## 2013-12-23 MED ORDER — POTASSIUM CHLORIDE 10 MEQ/100ML IV SOLN
10.0000 meq | INTRAVENOUS | Status: AC
  Administered 2013-12-23 (×2): 10 meq via INTRAVENOUS
  Filled 2013-12-23 (×2): qty 100

## 2013-12-23 MED ORDER — HEPARIN SODIUM (PORCINE) 5000 UNIT/ML IJ SOLN
5000.0000 [IU] | Freq: Three times a day (TID) | INTRAMUSCULAR | Status: DC
  Administered 2013-12-23 – 2013-12-29 (×17): 5000 [IU] via SUBCUTANEOUS
  Filled 2013-12-23 (×20): qty 1

## 2013-12-23 MED ORDER — HYDRALAZINE HCL 20 MG/ML IJ SOLN
10.0000 mg | INTRAMUSCULAR | Status: DC | PRN
Start: 2013-12-23 — End: 2013-12-25
  Administered 2013-12-23: 10 mg via INTRAVENOUS
  Filled 2013-12-23: qty 1

## 2013-12-23 MED ORDER — CARVEDILOL 25 MG PO TABS
25.0000 mg | ORAL_TABLET | Freq: Two times a day (BID) | ORAL | Status: DC
  Administered 2013-12-23 – 2013-12-25 (×4): 25 mg via ORAL
  Filled 2013-12-23 (×7): qty 1

## 2013-12-23 MED ORDER — DEXTROSE 5 % IV SOLN
500.0000 mg | INTRAVENOUS | Status: DC
  Administered 2013-12-24 – 2013-12-26 (×3): 500 mg via INTRAVENOUS
  Filled 2013-12-23 (×5): qty 500

## 2013-12-23 MED ORDER — MORPHINE SULFATE 2 MG/ML IJ SOLN
2.0000 mg | INTRAMUSCULAR | Status: DC | PRN
  Administered 2013-12-23 – 2013-12-27 (×7): 2 mg via INTRAVENOUS
  Filled 2013-12-23 (×7): qty 1

## 2013-12-23 MED ORDER — ACETAMINOPHEN 650 MG RE SUPP: 650.0000 mg | Freq: Four times a day (QID) | RECTAL | Status: DC | PRN

## 2013-12-23 MED ORDER — DEXTROSE 5 % IV SOLN
1.0000 g | Freq: Once | INTRAVENOUS | Status: AC
  Administered 2013-12-23: 1 g via INTRAVENOUS
  Filled 2013-12-23: qty 10

## 2013-12-23 MED ORDER — ACETAMINOPHEN 325 MG PO TABS
650.0000 mg | ORAL_TABLET | Freq: Four times a day (QID) | ORAL | Status: DC | PRN
  Administered 2013-12-23 – 2013-12-24 (×2): 650 mg via ORAL
  Filled 2013-12-23 (×3): qty 2

## 2013-12-23 MED ORDER — SODIUM CHLORIDE 0.9 % IJ SOLN
3.0000 mL | Freq: Two times a day (BID) | INTRAMUSCULAR | Status: DC
  Administered 2013-12-24 – 2013-12-29 (×7): 3 mL via INTRAVENOUS

## 2013-12-23 MED ORDER — SODIUM CHLORIDE 0.9 % IV SOLN
INTRAVENOUS | Status: DC
  Administered 2013-12-23: 10.8 [IU]/h via INTRAVENOUS
  Filled 2013-12-23: qty 2.5

## 2013-12-23 MED ORDER — SODIUM CHLORIDE 0.9 % IV SOLN: INTRAVENOUS | Status: AC

## 2013-12-23 MED ORDER — DEXTROSE-NACL 5-0.45 % IV SOLN: INTRAVENOUS | Status: DC

## 2013-12-23 NOTE — ED Notes (Signed)
CBG reading critical high, Nurse as informed.

## 2013-12-23 NOTE — Plan of Care (Signed)
Problem: Phase I Progression Outcomes Goal: CBGs steadily decreasing on IV insulin drip Outcome: Completed/Met Date Met:  12/23/13 Goal: Monitor hydration status Outcome: Completed/Met Date Met:  12/23/13

## 2013-12-23 NOTE — ED Notes (Signed)
Nicki Reaper, RN and Christy Gentles, MD both notified of abnormal lab test results

## 2013-12-23 NOTE — H&P (Addendum)
[facility] History and Physical  Lance [name] [id number] DOB: 06/01/1970 DOA: 12/23/2013  Referring physician: Emergency Department PCP: [name], MD  Specialists:   Chief Complaint: Cough, sob  HPI: Lance [name] is a 43 y.o. male  With a hx of type 1 DM, HLD, HTN, grade 2 diastolic CHF who presents to the ED with sob and cough that began 1-2 days prior to admit. Pt denies subjective fevers. In the ED, the patient was noted to have leukocytosis of 13k with tachycardia and CXR revealing RUL pneumonia. The patient was also noted to have glucose in the Q000111Q with metabolic acidosis and GAP of 24. The patient was started on abx, insulin gtt, and hospitalist service consulted for admission.  Review of Systems:  Per above, the remainder of the 10pt ros reviewed and are neg  Past Medical History  Diagnosis Date  . Type 1 diabetes mellitus 1990  . Essential hypertension, benign 2000    LVH  . Hyperlipidemia 2000  . Aortic stenosis     Very mild in 05/2007  . PSVT (paroxysmal supraventricular tachycardia)     Nonsustained; asymptomatic; diagnosed by event recorder in 2006  . Degenerative joint disease     Left TKA  . Tobacco abuse, in remission     20 pack years; discontinued in 1985; bronchitic changes on chest x-ray and 2009  . Diastolic heart failure     LVEF 65-70% with grade 2 diastolic dysfunction  . CKD (chronic kidney disease) stage 3, GFR 30-59 ml/min    Past Surgical History  Procedure Laterality Date  . Total knee arthroplasty      Left  . Lumbar spine surgery      Multiple procedures   Social History:  reports that he has never smoked. He does not have any smokeless tobacco history on file. He reports that he does not drink alcohol or use illicit drugs.  where does patient live--home, ALF, SNF? and with whom if at home?  Can patient participate in ADLs?  Allergies  Allergen Reactions  . Atorvastatin Other (See Comments)    Myalgias  .  Minoxidil Other (See Comments)    Fluid retention    Family History  Problem Relation Age of Onset  . Diabetes Father   . Diabetes Mother     (be sure to complete)  Prior to Admission medications   Medication Sig Start Date End Date Taking? Authorizing Provider  amLODipine (NORVASC) 10 MG tablet TAKE 1 TABLET EVERY DAY 03/08/13  Yes Arnoldo Lenis, MD  carvedilol (COREG) 25 MG tablet TAKE 2 TABLETS (50 MG TOTAL) BY MOUTH 2 (TWO) TIMES DAILY. 01/02/13  Yes Lendon Colonel, NP  cloNIDine (CATAPRES) 0.2 MG tablet TAKE 1 TABLET IN THE EVENING 11/21/12  Yes Lendon Colonel, NP  furosemide (LASIX) 80 MG tablet Take 1 tablet (80 mg total) by mouth 2 (two) times daily. 03/26/13  Yes Lendon Colonel, NP  hydrALAZINE (APRESOLINE) 50 MG tablet TAKE 1 TABLET (50 MG TOTAL) BY MOUTH 2 (TWO) TIMES DAILY. 02/10/13  Yes Lendon Colonel, NP  insulin aspart (NOVOLOG) 100 UNIT/ML injection Inject 10 Units into the skin 3 (three) times daily before meals.   Yes Historical Provider, MD  insulin detemir (LEVEMIR) 100 UNIT/ML injection Inject 40 Units into the skin at bedtime.   Yes Historical Provider, MD  losartan-hydrochlorothiazide (HYZAAR) 100-25 MG per tablet TAKE 1 TABLET BY MOUTH DAILY. 05/10/13  Yes Satira Sark, MD  metolazone (ZAROXOLYN) 5  MG tablet Take 1 tablet (5 mg total) by mouth once as needed. Patient taking differently: Take 5 mg by mouth once as needed (for fluid).  03/29/13  Yes Lendon Colonel, NP  potassium chloride SA (K-DUR,KLOR-CON) 20 MEQ tablet Take 1 tablet (20 mEq total) by mouth daily. Patient taking differently: Take 20 mEq by mouth daily as needed (for low potassium). Take only when taking Metolazone 03/29/13  Yes Lendon Colonel, NP  verapamil (CALAN-SR) 240 MG CR tablet TAKE 1 TABLET (240 MG TOTAL) BY MOUTH AT BEDTIME. 08/19/13  Yes Satira Sark, MD  hydrALAZINE (APRESOLINE) 50 MG tablet TAKE 1 TABLET (50 MG TOTAL) BY MOUTH 2 (TWO) TIMES DAILY.    Lendon Colonel, NP  torsemide (DEMADEX) 20 MG tablet TAKE 60MG  IN THE AM AND 40MG  IN THE PM Patient not taking: Reported on 12/23/2013 03/29/13   Lendon Colonel, NP   Physical Exam: Filed Vitals:   12/23/13 1330  BP: 183/78  Pulse: 123  Temp: 98.3 F (36.8 C)  TempSrc: Oral  Resp: 26  SpO2: 95%     General:  Awake, in nad  Eyes: PERRL B  ENT: membranes moist, dentition fair  Neck: trachea midline, neck supple  Cardiovascular: tachycardic, s1, s2  Respiratory: normal resp effort, no wheezing  Abdomen: soft, nondistended  Skin: normal skin turgor, no abnormal skin lesions seen  Musculoskeletal: perfused,no clubbing  Psychiatric: mood/affect normal// no auditory/visual hallucinations  Neurologic: cn2-12 grossly intact, strength/sensation intact  Labs on Admission:  Basic Metabolic Panel:  Recent Labs Lab 12/23/13 1401  NA 129*  K 5.0  CL 92*  CO2 13*  GLUCOSE 661*  BUN 43*  CREATININE 3.33*  CALCIUM 8.4   Liver Function Tests: No results for input(s): AST, ALT, ALKPHOS, BILITOT, PROT, ALBUMIN in the last 168 hours. No results for input(s): LIPASE, AMYLASE in the last 168 hours. No results for input(s): AMMONIA in the last 168 hours. CBC:  Recent Labs Lab 12/23/13 1401  WBC 13.3*  NEUTROABS 11.6*  HGB 12.7*  HCT 38.6*  MCV 95.3  PLT 252   Cardiac Enzymes:  Recent Labs Lab 12/23/13 1401  TROPONINI <0.30    BNP (last 3 results)  Recent Labs  12/23/13 1338  PROBNP 6963.0*   CBG:  Recent Labs Lab 12/23/13 1408  GLUCAP >600*    Radiological Exams on Admission: Dg Chest Portable 1 View  12/23/2013   ADDENDUM REPORT: 12/23/2013 14:17  ADDENDUM: Impression should read left upper lobe pneumonia and not right   Electronically Signed   By: [name] M.D.   On: 12/23/2013 14:17   12/23/2013   CLINICAL DATA:  Cough and shortness of Breath  EXAM: PORTABLE CHEST - 1 VIEW  COMPARISON:  None.  FINDINGS: Cardiac shadow is mildly enlarged.  Rounded area of infiltrate is noted in the left upper lobe consistent with acute pneumonia. Right lung is clear. No bony abnormality is noted.  IMPRESSION: Right upper lobe pneumonia. Followup films following appropriate therapy are recommended.  Electronically Signed: By: [name] M.D. On: 12/23/2013 14:02    EKG: Independently reviewed. Sinus tach  Assessment/Plan Principal Problem:   DKA (diabetic ketoacidoses) Active Problems:   Essential hypertension, benign   CAP (community acquired pneumonia)   Sepsis due to pneumonia   Metabolic acidemia   Acute renal failure  1. DKA 1. GAP of 24 2. Will continue insulin gtt and aggressive IVF per DKA protocol 3. Plan to transition to subQ insulin when  anion gap closes 4. Admit to stepdown 2. HTN 1. Suboptimal currently 2. Will resume home meds, but hold diuretics and ARB secondary to renal failure, plan to titrate meds accordingly 3. Will add PRN hydralazine 3. CAP 1. Cont on rocephin and azithromycin IV 2. Pan cultures 4. Sepsis with pneumonia 1. Per above 2. Pt with leukocytosis and tachycardia 5. Metabolic acidosis 1. Secondary to DKA 2. Insulin gtt with IVF per above 6. ARF 1. Likely secondary to above 2. IVF as tolerated 7. DVT prophylaxis 1. Heparin subQ 8. Elevated troponin 1. Sinus tach on ekg 2. Suspect possible trop leak in the setting of florid DKA and sepsis with pneumonia 3. Will follow serial trop. If trop cont to trend upwards, would then consult Cardiology  Code Status: Full  Family Communication: Pt in room Disposition Plan: Pending   Time spent: 71min  Dessire Grimes, [facility] Pager [phone]  If 7PM-7AM, please contact night-coverage [email or web address] Password [email or web address] 12/23/2013, 4:05 PM

## 2013-12-23 NOTE — ED Notes (Signed)
Patient states cough and sob, patient states he will have coughing episodes and that will gag him, patient states he has not taken any of his meds due to symptoms, CBG high per EMS (greater than 600)

## 2013-12-23 NOTE — ED Provider Notes (Signed)
CSN: [id number]     Arrival date & time 12/23/13  1320 History   First MD Initiated Contact with Patient 12/23/13 1323     Chief Complaint  Patient presents with  . Cough  . Hyperglycemia      Patient is a 43 y.o. male presenting with cough and hyperglycemia. The history is provided by the patient.  Cough Cough characteristics:  Dry Severity:  Moderate Onset quality:  Gradual Duration:  1 day Timing:  Intermittent Progression:  Worsening Chronicity:  New Relieved by:  Nothing Worsened by:  Nothing tried Associated symptoms: no chest pain and no fever   Hyperglycemia Associated symptoms: fatigue   Associated symptoms: no chest pain, no fever and no vomiting   Patient reports he has had increased dry cough, fatigue, SOB for past 1 day No hemoptysis No CP/Back pain He reports not taking his diabetic medications for one day because he has not had an appetite    Past Medical History  Diagnosis Date  . Type 1 diabetes mellitus 1990  . Essential hypertension, benign 2000    LVH  . Hyperlipidemia 2000  . Aortic stenosis     Very mild in 05/2007  . PSVT (paroxysmal supraventricular tachycardia)     Nonsustained; asymptomatic; diagnosed by event recorder in 2006  . Degenerative joint disease     Left TKA  . Tobacco abuse, in remission     20 pack years; discontinued in 1985; bronchitic changes on chest x-ray and 2009  . Diastolic heart failure     LVEF 65-70% with grade 2 diastolic dysfunction  . CKD (chronic kidney disease) stage 3, GFR 30-59 ml/min    Past Surgical History  Procedure Laterality Date  . Total knee arthroplasty      Left  . Lumbar spine surgery      Multiple procedures   Family History  Problem Relation Age of Onset  . Diabetes Father   . Diabetes Mother    History  Substance Use Topics  . Smoking status: Never Smoker   . Smokeless tobacco: Not on file  . Alcohol Use: No    Review of Systems  Constitutional: Positive for fatigue. Negative  for fever.  Respiratory: Positive for cough.   Cardiovascular: Negative for chest pain.  Gastrointestinal: Negative for vomiting.  Neurological: Negative for syncope.  All other systems reviewed and are negative.     Allergies  Atorvastatin and Minoxidil  Home Medications   Prior to Admission medications   Medication Sig Start Date End Date Taking? Authorizing Provider  amLODipine (NORVASC) 10 MG tablet TAKE 1 TABLET EVERY DAY 03/08/13   Arnoldo Lenis, MD  aspirin EC 81 MG tablet Take 81 mg by mouth daily.    Historical Provider, MD  carvedilol (COREG) 25 MG tablet TAKE 2 TABLETS (50 MG TOTAL) BY MOUTH 2 (TWO) TIMES DAILY. 01/02/13   Lendon Colonel, NP  cloNIDine (CATAPRES) 0.2 MG tablet TAKE 1 TABLET IN THE EVENING 11/21/12   Lendon Colonel, NP  furosemide (LASIX) 80 MG tablet Take 1 tablet (80 mg total) by mouth 2 (two) times daily. 03/26/13   Lendon Colonel, NP  hydrALAZINE (APRESOLINE) 50 MG tablet TAKE 1 TABLET (50 MG TOTAL) BY MOUTH 2 (TWO) TIMES DAILY. 02/10/13   Lendon Colonel, NP  hydrALAZINE (APRESOLINE) 50 MG tablet TAKE 1 TABLET (50 MG TOTAL) BY MOUTH 2 (TWO) TIMES DAILY.    Lendon Colonel, NP  insulin lispro protamine-insulin lispro (HUMALOG 75/25) (75-25) 100  UNIT/ML SUSP Inject 58-80 Units into the skin daily. 80 -150  58 units 150-300  68 units 300-400  80 units     Historical Provider, MD  insulin NPH-insulin regular (NOVOLIN 50/50) (50-50) 100 UNIT/ML injection Inject 30 Units into the skin daily before supper.      Historical Provider, MD  losartan-hydrochlorothiazide (HYZAAR) 100-25 MG per tablet TAKE 1 TABLET BY MOUTH DAILY. 05/10/13   Satira Sark, MD  metolazone (ZAROXOLYN) 5 MG tablet Take 1 tablet (5 mg total) by mouth once as needed. 03/29/13   Lendon Colonel, NP  potassium chloride SA (K-DUR,KLOR-CON) 20 MEQ tablet Take 1 tablet (20 mEq total) by mouth daily. 03/29/13   Lendon Colonel, NP  rosuvastatin (CRESTOR) 40 MG tablet  Take 40 mg by mouth 2 (two) times a week.    Historical Provider, MD  torsemide (DEMADEX) 20 MG tablet TAKE 60MG  IN THE AM AND 40MG  IN THE PM 03/29/13   Lendon Colonel, NP  verapamil (CALAN-SR) 240 MG CR tablet TAKE 1 TABLET (240 MG TOTAL) BY MOUTH AT BEDTIME. 08/19/13   Satira Sark, MD   BP 183/78 mmHg  Pulse 123  Temp(Src) 98.3 F (36.8 C) (Oral)  Resp 26  SpO2 95% Physical Exam CONSTITUTIONAL: Well developed/well nourished HEAD: Normocephalic/atraumatic EYES: EOMI/PERRL ENMT: Mucous membranes moist NECK: supple no meningeal signs SPINE:entire spine nontender CV: S1/S2 noted, no murmurs/rubs/gallops noted LUNGS: bilateral crackles noted.  Mild tachypnea noted ABDOMEN: soft, nontender, no rebound or guarding GU:no cva tenderness NEURO: Pt is awake/alert, moves all extremitiesx4 EXTREMITIES: pulses normal, full ROM, no LE edema noted SKIN: warm, color normal PSYCH: no abnormalities of mood noted  ED Course  Procedures  CRITICAL CARE Performed by: Sharyon Cable Total critical care time: 35 Critical care time was exclusive of separately billable procedures and treating other patients. Critical care was necessary to treat or prevent imminent or life-threatening deterioration. Critical care was time spent personally by me on the following activities: development of treatment plan with patient and/or surrogate as well as nursing, discussions with consultants, evaluation of patient's response to treatment, examination of patient, obtaining history from patient or surrogate, ordering and performing treatments and interventions, ordering and review of laboratory studies, ordering and review of radiographic studies, pulse oximetry and re-evaluation of patient's condition. PATIENT IS NOTED TO BE IN WORSENED RENAL FAILURE, DKA, AND ALSO PNEUMONIA  Pt with recent cough/SOB He is noted to be in DKA.  He also has pneumonia (community acquired) Will need IV insulin.  IV antibiotics  have been ordered D/W DR CHIU, WILL ADMIT TO STEPDOWN UNIT  Labs Review Labs Reviewed  BASIC METABOLIC PANEL - Abnormal; Notable for the following:    Sodium 129 (*)    Chloride 92 (*)    CO2 13 (*)    Glucose, Bld 661 (*)    BUN 43 (*)    Creatinine, Ser 3.33 (*)    GFR calc non Af Amer 21 (*)    GFR calc Af Amer 24 (*)    Anion gap 24 (*)    All other components within normal limits  CBC WITH DIFFERENTIAL - Abnormal; Notable for the following:    WBC 13.3 (*)    RBC 4.05 (*)    Hemoglobin 12.7 (*)    HCT 38.6 (*)    Neutrophils Relative % 87 (*)    Neutro Abs 11.6 (*)    Lymphocytes Relative 6 (*)    All other components within normal limits  PRO B NATRIURETIC PEPTIDE - Abnormal; Notable for the following:    Pro B Natriuretic peptide (BNP) 6963.0 (*)    All other components within normal limits  CBG MONITORING, ED - Abnormal; Notable for the following:    Glucose-Capillary >600 (*)    All other components within normal limits  I-STAT TROPOININ, ED - Abnormal; Notable for the following:    Troponin i, poc 0.13 (*)    All other components within normal limits  TROPONIN I    Imaging Review Dg Chest Portable 1 View  12/23/2013   ADDENDUM REPORT: 12/23/2013 14:17  ADDENDUM: Impression should read left upper lobe pneumonia and not right   Electronically Signed   By: [name] M.D.   On: 12/23/2013 14:17   12/23/2013   CLINICAL DATA:  Cough and shortness of Breath  EXAM: PORTABLE CHEST - 1 VIEW  COMPARISON:  None.  FINDINGS: Cardiac shadow is mildly enlarged. Rounded area of infiltrate is noted in the left upper lobe consistent with acute pneumonia. Right lung is clear. No bony abnormality is noted.  IMPRESSION: Right upper lobe pneumonia. Followup films following appropriate therapy are recommended.  Electronically Signed: By: [name] M.D. On: 12/23/2013 14:02     EKG Interpretation   Date/Time:  Thursday December 23 2013 13:32:40 EST Ventricular Rate:  123 PR  Interval:  141 QRS Duration: 77 QT Interval:  333 QTC Calculation: 476 R Axis:   95 Text Interpretation:  Sinus tachycardia Probable left atrial enlargement  Left ventricular hypertrophy Probable anterior infarct, old Borderline T  abnormalities, inferior leads No significant change since last tracing  Confirmed by Christy Gentles  MD, [place] ([id number]) on 12/23/2013 1:37:01 PM      MDM   Final diagnoses:  Cough  Diabetic ketoacidosis without coma associated with type 1 diabetes mellitus  CAP (community acquired pneumonia)  Acute renal failure, unspecified acute renal failure type    Nursing notes including past medical history and social history reviewed and considered in documentation xrays reviewed and considered Labs/vital reviewed and considered Previous records reviewed and considered     Sharyon Cable, MD 12/23/13 1607

## 2013-12-24 DIAGNOSIS — N189 Chronic kidney disease, unspecified: Secondary | ICD-10-CM

## 2013-12-24 DIAGNOSIS — E0810 Diabetes mellitus due to underlying condition with ketoacidosis without coma: Secondary | ICD-10-CM

## 2013-12-24 DIAGNOSIS — R7989 Other specified abnormal findings of blood chemistry: Secondary | ICD-10-CM

## 2013-12-24 DIAGNOSIS — I248 Other forms of acute ischemic heart disease: Secondary | ICD-10-CM | POA: Diagnosis present

## 2013-12-24 DIAGNOSIS — J189 Pneumonia, unspecified organism: Secondary | ICD-10-CM

## 2013-12-24 DIAGNOSIS — A419 Sepsis, unspecified organism: Principal | ICD-10-CM

## 2013-12-24 DIAGNOSIS — N179 Acute kidney failure, unspecified: Secondary | ICD-10-CM

## 2013-12-24 LAB — CBC
HEMATOCRIT: 32.4 % — AB (ref 39.0–52.0)
Hemoglobin: 10.8 g/dL — ABNORMAL LOW (ref 13.0–17.0)
MCH: 30.5 pg (ref 26.0–34.0)
MCHC: 33.3 g/dL (ref 30.0–36.0)
MCV: 91.5 fL (ref 78.0–100.0)
Platelets: 247 10*3/uL (ref 150–400)
RBC: 3.54 MIL/uL — ABNORMAL LOW (ref 4.22–5.81)
RDW: 13.3 % (ref 11.5–15.5)
WBC: 14.8 10*3/uL — AB (ref 4.0–10.5)

## 2013-12-24 LAB — COMPREHENSIVE METABOLIC PANEL
ALBUMIN: 1.4 g/dL — AB (ref 3.5–5.2)
ALK PHOS: 78 U/L (ref 39–117)
ALT: 8 U/L (ref 0–53)
AST: 13 U/L (ref 0–37)
Anion gap: 12 (ref 5–15)
BUN: 41 mg/dL — ABNORMAL HIGH (ref 6–23)
CHLORIDE: 102 meq/L (ref 96–112)
CO2: 18 meq/L — AB (ref 19–32)
CREATININE: 3.95 mg/dL — AB (ref 0.50–1.35)
Calcium: 8.1 mg/dL — ABNORMAL LOW (ref 8.4–10.5)
GFR calc Af Amer: 20 mL/min — ABNORMAL LOW (ref >90)
GFR calc non Af Amer: 17 mL/min — ABNORMAL LOW (ref >90)
Glucose, Bld: 125 mg/dL — ABNORMAL HIGH (ref 70–99)
POTASSIUM: 4.2 meq/L (ref 3.7–5.3)
Sodium: 132 mEq/L — ABNORMAL LOW (ref 137–147)
Total Protein: 5.7 g/dL — ABNORMAL LOW (ref 6.0–8.3)

## 2013-12-24 LAB — GLUCOSE, CAPILLARY
GLUCOSE-CAPILLARY: 144 mg/dL — AB (ref 70–99)
GLUCOSE-CAPILLARY: 151 mg/dL — AB (ref 70–99)
GLUCOSE-CAPILLARY: 90 mg/dL (ref 70–99)
Glucose-Capillary: 175 mg/dL — ABNORMAL HIGH (ref 70–99)
Glucose-Capillary: 169 mg/dL — ABNORMAL HIGH (ref 70–99)
Glucose-Capillary: 85 mg/dL (ref 70–99)
Glucose-Capillary: 61 mg/dL — ABNORMAL LOW (ref 70–99)
Glucose-Capillary: 71 mg/dL (ref 70–99)
Glucose-Capillary: 101 mg/dL — ABNORMAL HIGH (ref 70–99)

## 2013-12-24 LAB — HIV ANTIBODY (ROUTINE TESTING W REFLEX): HIV 1&2 Ab, 4th Generation: NONREACTIVE

## 2013-12-24 LAB — LEGIONELLA ANTIGEN, URINE

## 2013-12-24 LAB — TROPONIN I
TROPONIN I: 1.63 ng/mL — AB (ref <0.30)
Troponin I: 1.01 ng/mL (ref <0.30)

## 2013-12-24 LAB — STREP PNEUMONIAE URINARY ANTIGEN: Strep Pneumo Urinary Antigen: NEGATIVE

## 2013-12-24 LAB — INFLUENZA PANEL BY PCR (TYPE A & B)
H1N1 flu by pcr: NOT DETECTED
INFLBPCR: NEGATIVE
Influenza A By PCR: NEGATIVE

## 2013-12-24 MED ORDER — GUAIFENESIN-DM 100-10 MG/5ML PO SYRP
5.0000 mL | ORAL_SOLUTION | ORAL | Status: DC | PRN
  Administered 2013-12-24 – 2013-12-28 (×9): 5 mL via ORAL
  Filled 2013-12-24 (×14): qty 5

## 2013-12-24 MED ORDER — INSULIN ASPART 100 UNIT/ML ~~LOC~~ SOLN
0.0000 [IU] | Freq: Every day | SUBCUTANEOUS | Status: DC
  Administered 2013-12-25: 2 [IU] via SUBCUTANEOUS

## 2013-12-24 MED ORDER — SODIUM CHLORIDE 0.9 % IV SOLN
INTRAVENOUS | Status: DC
  Administered 2013-12-24 (×2): via INTRAVENOUS

## 2013-12-24 MED ORDER — LEVALBUTEROL HCL 0.63 MG/3ML IN NEBU
0.6300 mg | INHALATION_SOLUTION | Freq: Four times a day (QID) | RESPIRATORY_TRACT | Status: DC | PRN
  Administered 2013-12-24: 0.63 mg via RESPIRATORY_TRACT
  Filled 2013-12-24: qty 3

## 2013-12-24 MED ORDER — ACETAMINOPHEN 325 MG PO TABS
650.0000 mg | ORAL_TABLET | ORAL | Status: DC | PRN
  Administered 2013-12-24 – 2013-12-25 (×4): 650 mg via ORAL
  Filled 2013-12-24 (×3): qty 2

## 2013-12-24 MED ORDER — INSULIN ASPART 100 UNIT/ML ~~LOC~~ SOLN
0.0000 [IU] | Freq: Three times a day (TID) | SUBCUTANEOUS | Status: DC
  Administered 2013-12-25: 3 [IU] via SUBCUTANEOUS
  Administered 2013-12-25: 5 [IU] via SUBCUTANEOUS
  Administered 2013-12-25: 3 [IU] via SUBCUTANEOUS

## 2013-12-24 MED ORDER — ACETAMINOPHEN 650 MG RE SUPP: 650.0000 mg | Freq: Four times a day (QID) | RECTAL | Status: DC | PRN

## 2013-12-24 MED ORDER — SODIUM CHLORIDE 0.9 % IV SOLN
INTRAVENOUS | Status: DC
  Administered 2013-12-24: 06:00:00 via INTRAVENOUS

## 2013-12-24 MED ORDER — INSULIN DETEMIR 100 UNIT/ML ~~LOC~~ SOLN
40.0000 [IU] | Freq: Every day | SUBCUTANEOUS | Status: DC
  Administered 2013-12-24 – 2013-12-25 (×2): 40 [IU] via SUBCUTANEOUS
  Filled 2013-12-24 (×4): qty 0.4

## 2013-12-24 NOTE — Progress Notes (Signed)
Pt transferred to room 6E05 via bed.  Pt alert and oriented. VSS. Tele # 2.  Pleasant. Denies pain at this time.

## 2013-12-24 NOTE — Progress Notes (Signed)
Spoke with patient about his diabetes. Was diagnosed 26 years ago.  Has always been on insulin.  Takes Levemir 40 units every HS and Novolog 10-15 units TID with meals depending on his blood sugars.  States that he had taken Levemir on 11/11 before bed and then had only taken Novolog 10 units the morning of 11/12.  He has never had blood sugars as high as when he was admitted this time.  Sees Dr. [name] for diabetes control and last visit was in October.  Checks CBGs at home twice a day.  Recommended that he talk with Dr. [name] about sick days and how to manage.  States that he had a low blood sugar about 2 months which was very frightening. Will continue to follow while in hospital. Harvel Ricks RN BSN CDE

## 2013-12-24 NOTE — Progress Notes (Signed)
PROGRESS NOTE  Lance [name] [id number] DOB: June 09, 1970 DOA: 12/23/2013 PCP: [name], MD  Assessment/Plan: DKA  insulin gtt and aggressive IVF per DKA protocol levemir and novolog- switched from Gtt last PM  HTN Suboptimal currently Will resume home meds, but hold diuretics and ARB secondary to renal failure, plan to titrate meds accordingly  PRN hydralazine  Sepsis due to CAP Cont on rocephin and azithromycin IV Pan culture leukocytosis and tachycardia  ARF on CKD (baseline 2.2) Likely secondary to above IVF as tolerated  Elevated troponin Sinus tach on ekg Suspect possible trop leak in the setting of florid DKA and sepsis with pneumonia- trending down -echo ordered Cardiology consult  Anemia  Probably CKD  Monitor and transfuse as needed   Code Status: full Family Communication: patient Disposition Plan:    Consultants:  cardiology  Procedures:      HPI/Subjective: Feeling better, no CP, no SOB  Objective: Filed Vitals:   12/24/13 0621  BP:   Pulse: 95  Temp: 101.5 F (38.6 C)  Resp: 24    Intake/Output Summary (Last 24 hours) at 12/24/13 0750 Last data filed at 12/24/13 0210  Gross per 24 hour  Intake  606.9 ml  Output    725 ml  Net -118.1 ml   Filed Weights   12/23/13 1600  Weight: 105.8 kg (233 lb 4 oz)    Exam:   General:  A+Ox3, NAD  Cardiovascular: rrr  Respiratory: no wheezing  Abdomen: +Bs, soft  Musculoskeletal: moves all ext   Data Reviewed: Basic Metabolic Panel:  Recent Labs Lab 12/23/13 1401 12/23/13 1850 12/23/13 2105 12/23/13 2258 12/24/13 0603  NA 129* 133* 136* 137 132*  K 5.0 4.0 4.3 4.0 4.2  CL 92* 98 103 105 102  CO2 13* 16* 19 20 18*  GLUCOSE 661* 438* 215* 116* 125*  BUN 43* 43* 42* 42* 41*  CREATININE 3.33* 3.40* 3.62* 3.73* 3.95*  CALCIUM 8.4 8.3* 8.3* 8.2* 8.1*   Liver Function Tests:  Recent Labs Lab 12/24/13 0603  AST 13  ALT 8  ALKPHOS 78  BILITOT <0.2*  PROT  5.7*  ALBUMIN 1.4*   No results for input(s): LIPASE, AMYLASE in the last 168 hours. No results for input(s): AMMONIA in the last 168 hours. CBC:  Recent Labs Lab 12/23/13 1401 12/23/13 1850 12/24/13 0603  WBC 13.3* 14.8* 14.8*  NEUTROABS 11.6*  --   --   HGB 12.7* 12.2* 10.8*  HCT 38.6* 36.6* 32.4*  MCV 95.3 92.0 91.5  PLT 252 278 247   Cardiac Enzymes:  Recent Labs Lab 12/23/13 1401 12/24/13 12/24/13 0603  TROPONINI <0.30 1.63* 1.01*   BNP (last 3 results)  Recent Labs  12/23/13 1338  PROBNP 6963.0*   CBG:  Recent Labs Lab 12/23/13 2247 12/23/13 2350 12/24/13 0102 12/24/13 0210 12/24/13 0304  GLUCAP 124* 90 144* 151* 175*    Recent Results (from the past 240 hour(s))  MRSA PCR Screening     Status: None   Collection Time: 12/23/13  2:12 PM  Result Value Ref Range Status   MRSA by PCR NEGATIVE NEGATIVE Final    Comment:        The GeneXpert MRSA Assay (FDA approved for NASAL specimens only), is one component of a comprehensive MRSA colonization surveillance program. It is not intended to diagnose MRSA infection nor to guide or monitor treatment for MRSA infections.      Studies: Dg Chest Portable 1 View  12/23/2013   ADDENDUM REPORT: 12/23/2013  14:17  ADDENDUM: Impression should read left upper lobe pneumonia and not right   Electronically Signed   By: [name] M.D.   On: 12/23/2013 14:17   12/23/2013   CLINICAL DATA:  Cough and shortness of Breath  EXAM: PORTABLE CHEST - 1 VIEW  COMPARISON:  None.  FINDINGS: Cardiac shadow is mildly enlarged. Rounded area of infiltrate is noted in the left upper lobe consistent with acute pneumonia. Right lung is clear. No bony abnormality is noted.  IMPRESSION: Right upper lobe pneumonia. Followup films following appropriate therapy are recommended.  Electronically Signed: By: [name] M.D. On: 12/23/2013 14:02    Scheduled Meds: . azithromycin  500 mg Intravenous Q24H  . carvedilol  25 mg Oral BID  WC  . cefTRIAXone (ROCEPHIN)  IV  1 g Intravenous Q24H  . cloNIDine  0.2 mg Oral QHS  . heparin  5,000 Units Subcutaneous 3 times per day  . hydrALAZINE  50 mg Oral BID  . insulin aspart  0-5 Units Subcutaneous QHS  . insulin aspart  0-9 Units Subcutaneous TID WC  . insulin detemir  40 Units Subcutaneous QHS  . sodium chloride  3 mL Intravenous Q12H  . verapamil  240 mg Oral QHS   Continuous Infusions: . sodium chloride 75 mL/hr at 12/24/13 [id number]   Antibiotics Given (last 72 hours)    None      Principal Problem:   DKA (diabetic ketoacidoses) Active Problems:   Essential hypertension, benign   CAP (community acquired pneumonia)   Sepsis due to pneumonia   Metabolic acidemia   Acute renal failure   Diabetic ketoacidosis    Time spent: 35 min    Donasia Wimes, [place] [facility] Pager [phone]. If 7PM-7AM, please contact night-coverage at [email or web address], password [facility] 12/24/2013, 7:50 AM  LOS: 1 day

## 2013-12-24 NOTE — Progress Notes (Signed)
Report called to [facility], pt transferring to [phone] via w/c with belongings and family.

## 2013-12-24 NOTE — Progress Notes (Signed)
CRITICAL VALUE ALERT  Critical value received:  Troponin 1.63  Date of notification:  12/24/2013  Time of notification:  0130  Critical value read back:Yes.    Nurse who received alert:  K. Royden Purl  MD notified (1st page):  Rogue Bussing, NP  Time of first page:  0130  MD notified (2nd page):  Time of second page:  Responding MD:  Rogue Bussing, NP  Time MD responded:  [phone]

## 2013-12-24 NOTE — Progress Notes (Signed)
md notified and tylenol given

## 2013-12-24 NOTE — Plan of Care (Signed)
Problem: Phase I Progression Outcomes Goal: Acidosis resolving Outcome: Progressing Goal: NPO or per MD order Outcome: Completed/Met Date Met:  12/24/13 Goal: Pain controlled with appropriate interventions Outcome: Completed/Met Date Met:  12/24/13 Goal: OOB as tolerated unless otherwise ordered Outcome: Progressing Goal: Voiding-avoid urinary catheter unless indicated Outcome: Completed/Met Date Met:  12/24/13  Problem: Phase II Progression Outcomes Goal: Tolerating PO clear liquid diet Outcome: Completed/Met Date Met:  12/24/13

## 2013-12-24 NOTE — Progress Notes (Signed)
Patient Name: Lance [name] Date of Encounter: 12/24/2013     Principal Problem:   DKA (diabetic ketoacidoses) Active Problems:   Essential hypertension, benign   CAP (community acquired pneumonia)   Sepsis due to pneumonia   Metabolic acidemia   Acute renal failure   Diabetic ketoacidosis    SUBJECTIVE  Feeling much better. No CP or SOB  CURRENT MEDS . azithromycin  500 mg Intravenous Q24H  . carvedilol  25 mg Oral BID WC  . cefTRIAXone (ROCEPHIN)  IV  1 g Intravenous Q24H  . cloNIDine  0.2 mg Oral QHS  . heparin  5,000 Units Subcutaneous 3 times per day  . hydrALAZINE  50 mg Oral BID  . insulin aspart  0-5 Units Subcutaneous QHS  . insulin aspart  0-9 Units Subcutaneous TID WC  . insulin detemir  40 Units Subcutaneous QHS  . sodium chloride  3 mL Intravenous Q12H  . verapamil  240 mg Oral QHS    OBJECTIVE  Filed Vitals:   12/24/13 0110 12/24/13 0305 12/24/13 0621 12/24/13 0745  BP: 96/56 109/66  114/60  Pulse:  85 95 91  Temp:  100.7 F (38.2 C) 101.5 F (38.6 C) 99.7 F (37.6 C)  TempSrc:  Oral Oral Oral  Resp:  26 24 26   Height:      Weight:      SpO2:  92%  95%    Intake/Output Summary (Last 24 hours) at 12/24/13 1006 Last data filed at 12/24/13 0500  Gross per 24 hour  Intake 609.92 ml  Output    725 ml  Net -115.08 ml   Filed Weights   12/23/13 1600  Weight: 233 lb 4 oz (105.8 kg)    PHYSICAL EXAM  General: Pleasant, NAD. Neuro: Alert and oriented X 3. Moves all extremities spontaneously. Psych: Normal affect. HEENT:  Normal  Neck: Supple without bruits or JVD. Lungs:  Resp regular and unlabored, CTA. Heart: RRR no s3, s4, or murmurs. Abdomen: Soft, non-tender, non-distended, BS + x 4.  Extremities: No clubbing, cyanosis or edema. DP/PT/Radials 2+ and equal bilaterally.  Accessory Clinical Findings  CBC  Recent Labs  12/23/13 1401 12/23/13 1850 12/24/13 0603  WBC 13.3* 14.8* 14.8*  NEUTROABS 11.6*  --   --   HGB 12.7*  12.2* 10.8*  HCT 38.6* 36.6* 32.4*  MCV 95.3 92.0 91.5  PLT 252 278 [id number]   Basic Metabolic Panel  Recent Labs  12/23/13 2258 12/24/13 0603  NA 137 132*  K 4.0 4.2  CL 105 102  CO2 20 18*  GLUCOSE 116* 125*  BUN 42* 41*  CREATININE 3.73* 3.95*  CALCIUM 8.2* 8.1*   Liver Function Tests  Recent Labs  12/24/13 0603  AST 13  ALT 8  ALKPHOS 78  BILITOT <0.2*  PROT 5.7*  ALBUMIN 1.4*   No results for input(s): LIPASE, AMYLASE in the last 72 hours. Cardiac Enzymes  Recent Labs  12/23/13 1401 12/24/13 12/24/13 0603  TROPONINI <0.30 1.63* 1.01*    TELE NSR  Radiology/Studies  Dg Chest Portable 1 View  12/23/2013   ADDENDUM REPORT: 12/23/2013 14:17  ADDENDUM: Impression should read left upper lobe pneumonia and not right   Electronically Signed   By: [name] M.D.   On: 12/23/2013 14:17   12/23/2013   CLINICAL DATA:  Cough and shortness of Breath  EXAM: PORTABLE CHEST - 1 VIEW  COMPARISON:  None.  FINDINGS: Cardiac shadow is mildly enlarged. Rounded area of infiltrate is noted  in the left upper lobe consistent with acute pneumonia. Right lung is clear. No bony abnormality is noted.  IMPRESSION: Right upper lobe pneumonia. Followup films following appropriate therapy are recommended.  Electronically Signed: By: [name] M.D. On: 12/23/2013 14:02    ASSESSMENT AND PLAN  Lance [name] is a 43 y.o. male with a history of T1DM, recent diagnosis of CAP, dyslipidemia, chronic kidney disease stage V  And HLD who presented to [facility] last night with DKA, hypertension and community acquire pneumonia. Cardiology Consulted for elevated troponin.   Elevated troponin- peak 1.63--> now trending downward to 1.01. -- Favor demand ischemia in the setting of DKA/PNA/Sepsis. No chest pain. However, he does have many RFs for CAD. He will need functional status vs. LHC when clinically stable or when he has stable creatinine/renal function (for cath) -- He is followed by Jory Sims NP in [place] and states that he had a ETT stress test approx a couple years ago. I see one that was done in 2012 with Dr. 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 Creat 3.33 on admission. (2.21 in 03/2013). Continuing to climb and now 3.95.  -- Cont to hold Hyzaar and lasix -- Has a hx of diastolic CHF, watch volume status closely.  HTN- continue clonidine 0.2mg  qd, verapamil Cr 240mg , coreg 25mg  BID and hydralazine 50mg  BID -- Holding hyzaar and lasix due to AKI  DKA with T1DM/ hyperglycemia -- Per IM   Sepsis/PNA -- Tmax 102. Now 99.7 -- Continue Abx per IM  We are not primary service but he is stable to be transferred to the floor.   Judy Pimple PA-C  Pager [phone]  I have personally seen and examined this patient with Angelena Form, PA-C.  I agree with the assessment and plan as outlined above. He is admitted with pneumonia, sepsis and renal failure. Troponin is elevated, likely due to demand ischemia although he may have underlyling CAD. No chest pain. EKG without ischemic changes. Poor R wave progression precordial leads. Will not be a candidate for cardiac cath with his renal failure. We will follow with you. May be able to arrange stress myoview before discharge.   Velmer Woelfel 12/24/2013 11:50 AM

## 2013-12-24 NOTE — Progress Notes (Signed)
Around 2100 patient raised concerns about getting a breathing treatment and Robitussin. MD was notified, and placed Robitussin and Xopenex as PRN medications. Patient remained unhappy, and requested to speak to the charge nurse. Lance Boston, RN (Camera operator) diffused the situation and provided Lance [name] with the breathing treatment and Robitussin. Lance [name] also contacted the respiratory team. A respiratory team member arrived on the unit shortly to assess the patient, and stated that crackles were heard in the left upper lobe. The respiratory team member did not recommend scheduled respiratory treatments, but suggested a continuous pulse ox. Will continue to monitor patient.

## 2013-12-24 NOTE — Progress Notes (Signed)
Notified dr Eliseo Squires re: pt's temp 103, new orders rec'd for temp. And md okay with pt transferring to [place].

## 2013-12-24 NOTE — Progress Notes (Signed)
Pt has a temp of 103. Tylenol given. MD notified. Will continue to monitor pt.

## 2013-12-24 NOTE — Consult Note (Signed)
Reason for Consult: elevated troponin Referring Physician: Dr. Rogue Bussing Cardiology:  Lance [name] is an 44 y.o. male.  HPI: Lance [name] is a 43 yo man with T1DM, dyslipidemia, hypertension and grade II diastolic heart failure who initially presented tot he ER with shortness of breath and cough. In the ER patient noted to have leukocytosis to 13k and RUL infiltrate on chest x-ray along with hyperglycemia and metabolic acidosis with GAP of 24. He also had acute renal failure. He denies chest pain or exercise intolerance at home - he tells me he can even run and no difficulties with walking recently. Cardiology consulted for Troponin 1.6.   Past Medical History  Diagnosis Date  . Type 1 diabetes mellitus 1990  . Essential hypertension, benign 2000    LVH  . Hyperlipidemia 2000  . Aortic stenosis     Very mild in 05/2007  . PSVT (paroxysmal supraventricular tachycardia)     Nonsustained; asymptomatic; diagnosed by event recorder in 2006  . Degenerative joint disease     Left TKA  . Tobacco abuse, in remission     20 pack years; discontinued in 1985; bronchitic changes on chest x-ray and 2009  . Diastolic heart failure     LVEF 65-70% with grade 2 diastolic dysfunction  . CKD (chronic kidney disease) stage 3, GFR 30-59 ml/min     Past Surgical History  Procedure Laterality Date  . Total knee arthroplasty      Left  . Lumbar spine surgery      Multiple procedures    Family History  Problem Relation Age of Onset  . Diabetes Father   . Diabetes Mother     Social History:  reports that he has never smoked. He does not have any smokeless tobacco history on file. He reports that he does not drink alcohol or use illicit drugs.  Allergies:  Allergies  Allergen Reactions  . Atorvastatin Other (See Comments)    Myalgias  . Minoxidil Other (See Comments)    Fluid retention    Medications:  I have reviewed the patient's current medications. Prior to Admission:    Prescriptions prior to admission  Medication Sig Dispense Refill Last Dose  . amLODipine (NORVASC) 10 MG tablet TAKE 1 TABLET EVERY DAY 90 tablet 6 12/22/2013 at Unknown time  . carvedilol (COREG) 25 MG tablet TAKE 2 TABLETS (50 MG TOTAL) BY MOUTH 2 (TWO) TIMES DAILY. 120 tablet 7 12/22/2013 at 7pm  . cloNIDine (CATAPRES) 0.2 MG tablet TAKE 1 TABLET IN THE EVENING 30 tablet 6 12/22/2013 at Unknown time  . furosemide (LASIX) 80 MG tablet Take 1 tablet (80 mg total) by mouth 2 (two) times daily.   12/22/2013 at Unknown time  . hydrALAZINE (APRESOLINE) 50 MG tablet TAKE 1 TABLET (50 MG TOTAL) BY MOUTH 2 (TWO) TIMES DAILY. 180 tablet 0 12/22/2013 at Unknown time  . insulin aspart (NOVOLOG) 100 UNIT/ML injection Inject 10 Units into the skin 3 (three) times daily before meals.   12/22/2013 at Unknown time  . insulin detemir (LEVEMIR) 100 UNIT/ML injection Inject 40 Units into the skin at bedtime.   12/22/2013 at Unknown time  . losartan-hydrochlorothiazide (HYZAAR) 100-25 MG per tablet TAKE 1 TABLET BY MOUTH DAILY. 30 tablet 9 12/22/2013 at Unknown time  . metolazone (ZAROXOLYN) 5 MG tablet Take 1 tablet (5 mg total) by mouth once as needed. (Patient taking differently: Take 5 mg by mouth once as needed (for fluid). ) 5 tablet 0 more than a month  .  potassium chloride SA (K-DUR,KLOR-CON) 20 MEQ tablet Take 1 tablet (20 mEq total) by mouth daily. (Patient taking differently: Take 20 mEq by mouth daily as needed (for low potassium). Take only when taking Metolazone) 30 tablet 6 more than a month at Unknown time  . verapamil (CALAN-SR) 240 MG CR tablet TAKE 1 TABLET (240 MG TOTAL) BY MOUTH AT BEDTIME. 30 tablet 3 12/22/2013 at Unknown time  . hydrALAZINE (APRESOLINE) 50 MG tablet TAKE 1 TABLET (50 MG TOTAL) BY MOUTH 2 (TWO) TIMES DAILY. 180 tablet 1   . torsemide (DEMADEX) 20 MG tablet TAKE 60MG IN THE AM AND 40MG IN THE PM (Patient not taking: Reported on 12/23/2013) 150 tablet 6 NO   Scheduled: .  azithromycin  500 mg Intravenous Q24H  . carvedilol  25 mg Oral BID WC  . cefTRIAXone (ROCEPHIN)  IV  1 g Intravenous Q24H  . cloNIDine  0.2 mg Oral QHS  . heparin  5,000 Units Subcutaneous 3 times per day  . hydrALAZINE  50 mg Oral BID  . sodium chloride  3 mL Intravenous Q12H  . verapamil  240 mg Oral QHS    Results for orders placed or performed during the hospital encounter of 12/23/13 (from the past 48 hour(s))  Pro b natriuretic peptide (BNP)     Status: Abnormal   Collection Time: 12/23/13  1:38 PM  Result Value Ref Range   Pro B Natriuretic peptide (BNP) 6963.0 (H) 0 - 125 pg/mL  Basic metabolic panel     Status: Abnormal   Collection Time: 12/23/13  2:01 PM  Result Value Ref Range   Sodium 129 (L) 137 - 147 mEq/L   Potassium 5.0 3.7 - 5.3 mEq/L   Chloride 92 (L) 96 - 112 mEq/L   CO2 13 (L) 19 - 32 mEq/L   Glucose, Bld 661 (HH) 70 - 99 mg/dL    Comment: CRITICAL RESULT CALLED TO, READ BACK BY AND VERIFIED WITH: M SCRUGGS,RN 1510 12/23/13 D BRADLEY    BUN 43 (H) 6 - 23 mg/dL   Creatinine, Ser 3.33 (H) 0.50 - 1.35 mg/dL   Calcium 8.4 8.4 - 10.5 mg/dL   GFR calc non Af Amer 21 (L) >90 mL/min   GFR calc Af Amer 24 (L) >90 mL/min    Comment: (NOTE) The eGFR has been calculated using the CKD EPI equation. This calculation has not been validated in all clinical situations. eGFR's persistently <90 mL/min signify possible Chronic Kidney Disease.    Anion gap 24 (H) 5 - 15  CBC with Differential     Status: Abnormal   Collection Time: 12/23/13  2:01 PM  Result Value Ref Range   WBC 13.3 (H) 4.0 - 10.5 K/uL   RBC 4.05 (L) 4.22 - 5.81 MIL/uL   Hemoglobin 12.7 (L) 13.0 - 17.0 g/dL   HCT 38.6 (L) 39.0 - 52.0 %   MCV 95.3 78.0 - 100.0 fL   MCH 31.4 26.0 - 34.0 pg   MCHC 32.9 30.0 - 36.0 g/dL   RDW 13.3 11.5 - 15.5 %   Platelets 252 150 - 400 K/uL   Neutrophils Relative % 87 (H) 43 - 77 %   Neutro Abs 11.6 (H) 1.7 - 7.7 K/uL   Lymphocytes Relative 6 (L) 12 - 46 %   Lymphs  Abs 0.9 0.7 - 4.0 K/uL   Monocytes Relative 7 3 - 12 %   Monocytes Absolute 0.9 0.1 - 1.0 K/uL   Eosinophils Relative 0 0 - 5 %  Eosinophils Absolute 0.0 0.0 - 0.7 K/uL   Basophils Relative 0 0 - 1 %   Basophils Absolute 0.0 0.0 - 0.1 K/uL  Troponin I     Status: None   Collection Time: 12/23/13  2:01 PM  Result Value Ref Range   Troponin I <0.30 <0.30 ng/mL    Comment:        Due to the release kinetics of cTnI, a negative result within the first hours of the onset of symptoms does not rule out myocardial infarction with certainty. If myocardial infarction is still suspected, repeat the test at appropriate intervals.   CBG monitoring, ED     Status: Abnormal   Collection Time: 12/23/13  2:08 PM  Result Value Ref Range   Glucose-Capillary >600 (HH) 70 - 99 mg/dL   Comment 1 Documented in Chart    Comment 2 Notify RN   MRSA PCR Screening     Status: None   Collection Time: 12/23/13  2:12 PM  Result Value Ref Range   MRSA by PCR NEGATIVE NEGATIVE    Comment:        The GeneXpert MRSA Assay (FDA approved for NASAL specimens only), is one component of a comprehensive MRSA colonization surveillance program. It is not intended to diagnose MRSA infection nor to guide or monitor treatment for MRSA infections.   I-stat troponin, ED     Status: Abnormal   Collection Time: 12/23/13  2:52 PM  Result Value Ref Range   Troponin i, poc 0.13 (HH) 0.00 - 0.08 ng/mL   Comment NOTIFIED PHYSICIAN    Comment 3            Comment: Due to the release kinetics of cTnI, a negative result within the first hours of the onset of symptoms does not rule out myocardial infarction with certainty. If myocardial infarction is still suspected, repeat the test at appropriate intervals.   Glucose, capillary     Status: Abnormal   Collection Time: 12/23/13  5:13 PM  Result Value Ref Range   Glucose-Capillary >600 (HH) 70 - 99 mg/dL  Glucose, capillary     Status: Abnormal   Collection Time:  12/23/13  6:23 PM  Result Value Ref Range   Glucose-Capillary 497 (H) 70 - 99 mg/dL  Basic metabolic panel (timed)     Status: Abnormal   Collection Time: 12/23/13  6:50 PM  Result Value Ref Range   Sodium 133 (L) 137 - 147 mEq/L   Potassium 4.0 3.7 - 5.3 mEq/L    Comment: DELTA CHECK NOTED   Chloride 98 96 - 112 mEq/L   CO2 16 (L) 19 - 32 mEq/L   Glucose, Bld 438 (H) 70 - 99 mg/dL   BUN 43 (H) 6 - 23 mg/dL   Creatinine, Ser 3.40 (H) 0.50 - 1.35 mg/dL   Calcium 8.3 (L) 8.4 - 10.5 mg/dL   GFR calc non Af Amer 20 (L) >90 mL/min   GFR calc Af Amer 24 (L) >90 mL/min    Comment: (NOTE) The eGFR has been calculated using the CKD EPI equation. This calculation has not been validated in all clinical situations. eGFR's persistently <90 mL/min signify possible Chronic Kidney Disease.    Anion gap 19 (H) 5 - 15  CBC     Status: Abnormal   Collection Time: 12/23/13  6:50 PM  Result Value Ref Range   WBC 14.8 (H) 4.0 - 10.5 K/uL   RBC 3.98 (L) 4.22 - 5.81 MIL/uL   Hemoglobin  12.2 (L) 13.0 - 17.0 g/dL   HCT 36.6 (L) 39.0 - 52.0 %   MCV 92.0 78.0 - 100.0 fL   MCH 30.7 26.0 - 34.0 pg   MCHC 33.3 30.0 - 36.0 g/dL   RDW 13.2 11.5 - 15.5 %   Platelets 278 150 - 400 K/uL  Glucose, capillary     Status: Abnormal   Collection Time: 12/23/13  7:20 PM  Result Value Ref Range   Glucose-Capillary 374 (H) 70 - 99 mg/dL  Glucose, capillary     Status: Abnormal   Collection Time: 12/23/13  8:23 PM  Result Value Ref Range   Glucose-Capillary 241 (H) 70 - 99 mg/dL  Basic metabolic panel (timed)     Status: Abnormal   Collection Time: 12/23/13  9:05 PM  Result Value Ref Range   Sodium 136 (L) 137 - 147 mEq/L   Potassium 4.3 3.7 - 5.3 mEq/L   Chloride 103 96 - 112 mEq/L   CO2 19 19 - 32 mEq/L   Glucose, Bld 215 (H) 70 - 99 mg/dL   BUN 42 (H) 6 - 23 mg/dL   Creatinine, Ser 3.62 (H) 0.50 - 1.35 mg/dL   Calcium 8.3 (L) 8.4 - 10.5 mg/dL   GFR calc non Af Amer 19 (L) >90 mL/min   GFR calc Af Amer  22 (L) >90 mL/min    Comment: (NOTE) The eGFR has been calculated using the CKD EPI equation. This calculation has not been validated in all clinical situations. eGFR's persistently <90 mL/min signify possible Chronic Kidney Disease.    Anion gap 14 5 - 15  Glucose, capillary     Status: Abnormal   Collection Time: 12/23/13  9:39 PM  Result Value Ref Range   Glucose-Capillary 181 (H) 70 - 99 mg/dL  Glucose, capillary     Status: Abnormal   Collection Time: 12/23/13 10:47 PM  Result Value Ref Range   Glucose-Capillary 124 (H) 70 - 99 mg/dL  Strep pneumoniae urinary antigen     Status: None   Collection Time: 12/23/13 10:54 PM  Result Value Ref Range   Strep Pneumo Urinary Antigen NEGATIVE NEGATIVE    Comment:        Infection due to S. pneumoniae cannot be absolutely ruled out since the antigen present may be below the detection limit of the test.   Basic metabolic panel (timed)     Status: Abnormal   Collection Time: 12/23/13 10:58 PM  Result Value Ref Range   Sodium 137 137 - 147 mEq/L   Potassium 4.0 3.7 - 5.3 mEq/L   Chloride 105 96 - 112 mEq/L   CO2 20 19 - 32 mEq/L   Glucose, Bld 116 (H) 70 - 99 mg/dL   BUN 42 (H) 6 - 23 mg/dL   Creatinine, Ser 3.73 (H) 0.50 - 1.35 mg/dL   Calcium 8.2 (L) 8.4 - 10.5 mg/dL   GFR calc non Af Amer 18 (L) >90 mL/min   GFR calc Af Amer 21 (L) >90 mL/min    Comment: (NOTE) The eGFR has been calculated using the CKD EPI equation. This calculation has not been validated in all clinical situations. eGFR's persistently <90 mL/min signify possible Chronic Kidney Disease.    Anion gap 12 5 - 15  Glucose, capillary     Status: None   Collection Time: 12/23/13 11:50 PM  Result Value Ref Range   Glucose-Capillary 90 70 - 99 mg/dL  Troponin I (q 6hr x 3)  Status: Abnormal   Collection Time: 12/24/13 12:00 AM  Result Value Ref Range   Troponin I 1.63 (HH) <0.30 ng/mL    Comment:        Due to the release kinetics of cTnI, a negative  result within the first hours of the onset of symptoms does not rule out myocardial infarction with certainty. If myocardial infarction is still suspected, repeat the test at appropriate intervals. CRITICAL RESULT CALLED TO, READ BACK BY AND VERIFIED WITH: Darla Lesches RN [phone] GREEN R QUESTIONABLE RESULTS, RECOMMEND RECOLLECT TO VERIFY   Glucose, capillary     Status: Abnormal   Collection Time: 12/24/13  1:02 AM  Result Value Ref Range   Glucose-Capillary 144 (H) 70 - 99 mg/dL    Dg Chest Portable 1 View  12/23/2013   ADDENDUM REPORT: 12/23/2013 14:17  ADDENDUM: Impression should read left upper lobe pneumonia and not right   Electronically Signed   By: [name] M.D.   On: 12/23/2013 14:17   12/23/2013   CLINICAL DATA:  Cough and shortness of Breath  EXAM: PORTABLE CHEST - 1 VIEW  COMPARISON:  None.  FINDINGS: Cardiac shadow is mildly enlarged. Rounded area of infiltrate is noted in the left upper lobe consistent with acute pneumonia. Right lung is clear. No bony abnormality is noted.  IMPRESSION: Right upper lobe pneumonia. Followup films following appropriate therapy are recommended.  Electronically Signed: By: [name] M.D. On: 12/23/2013 14:02    Review of Systems  Constitutional: Positive for malaise/fatigue. Negative for fever and chills.  HENT: Negative for tinnitus.   Eyes: Negative for blurred vision and double vision.  Respiratory: Positive for cough and shortness of breath.   Cardiovascular: Positive for palpitations. Negative for orthopnea and claudication.  Gastrointestinal: Negative for nausea and vomiting.  Genitourinary: Negative for dysuria and frequency.  Musculoskeletal: Negative for myalgias and neck pain.  Skin: Negative for rash.  Neurological: Negative for tingling, tremors and headaches.  Endo/Heme/Allergies: Negative for polydipsia. Bruises/bleeds easily.  Psychiatric/Behavioral: Negative for depression, suicidal ideas and substance abuse.    Blood pressure 96/56, pulse 89, temperature 100.3 F (37.9 C), temperature source Axillary, resp. rate 22, height _0  (1.905 m), weight 105.8 kg (233 lb 4 oz), SpO2 94 %. Physical Exam  Nursing note and vitals reviewed. Constitutional: He is oriented to person, place, and time. He appears well-developed and well-nourished. No distress.  HENT:  Head: Normocephalic and atraumatic.  Nose: Nose normal.  Mouth/Throat: Oropharynx is clear and moist. No oropharyngeal exudate.  Eyes: Conjunctivae and EOM are normal. Pupils are equal, round, and reactive to light.  Neck: Normal range of motion. Neck supple. No JVD present.  Cardiovascular: Normal rate, regular rhythm and intact distal pulses.   Murmur heard. Respiratory: Effort normal and breath sounds normal. No respiratory distress. He has no wheezes. He has no rales.  GI: Soft. Bowel sounds are normal. He exhibits no distension. There is no tenderness. There is no rebound.  Musculoskeletal: Normal range of motion. He exhibits no edema or tenderness.  Neurological: He is alert and oriented to person, place, and time. No cranial nerve deficit. Coordination normal.  Skin: Skin is warm and dry. No rash noted. He is not diaphoretic. No erythema.  Psychiatric: He has a normal mood and affect. His behavior is normal. Thought content normal.   Na 137, K 4.0, bun/cr 42/3.73, glucose 116, Trop 1.63 8/12 Echo: grade II DD, EF 65-70%  Assessment/Plan: 1. Acute on chronic renal failure 2.  DKA with T1DM/ hyperglycemia 3. Sepsis/PNA 4. Elevated Troponin 43 yo man with PMH of T1DM, recent diagnosis of CAP, dyslipidemia, chronic kidney disease stage V who presents with DKA, hypertension and community acquire pneumonia. Cardiology Consulted for elevated troponin. Differential diagnosis is musculoskeletal pain, esophageal spasm, GERD, pericarditis, ACS/NSTEMI among other etiologies. I favor a diagnosis of type II nSTEMI - demand ischemia. However, I believe  he likely has underlying CAD. He will need functional status vs. LHC when clinically stable or when he has stable creatinine/renal function if we consider LHC.  - trend cardiac markers - observation on telemetry  - continue home medications - echocardiogram in AM - already received aspirin - start heparin if development of chest pain, findings on ECG or significant rise in troponin  Code Status: Full   Alvester Eads 12/24/2013, 1:51 AM

## 2013-12-25 ENCOUNTER — Inpatient Hospital Stay (HOSPITAL_COMMUNITY): Payer: BC Managed Care – PPO

## 2013-12-25 DIAGNOSIS — I059 Rheumatic mitral valve disease, unspecified: Secondary | ICD-10-CM

## 2013-12-25 DIAGNOSIS — I10 Essential (primary) hypertension: Secondary | ICD-10-CM

## 2013-12-25 LAB — GLUCOSE, CAPILLARY
GLUCOSE-CAPILLARY: 263 mg/dL — AB (ref 70–99)
Glucose-Capillary: 110 mg/dL — ABNORMAL HIGH (ref 70–99)
Glucose-Capillary: 246 mg/dL — ABNORMAL HIGH (ref 70–99)
Glucose-Capillary: 203 mg/dL — ABNORMAL HIGH (ref 70–99)
Glucose-Capillary: 234 mg/dL — ABNORMAL HIGH (ref 70–99)

## 2013-12-25 LAB — BASIC METABOLIC PANEL
ANION GAP: 20 — AB (ref 5–15)
Anion gap: 20 — ABNORMAL HIGH (ref 5–15)
BUN: 50 mg/dL — ABNORMAL HIGH (ref 6–23)
BUN: 59 mg/dL — ABNORMAL HIGH (ref 6–23)
CHLORIDE: 93 meq/L — AB (ref 96–112)
CHLORIDE: 94 meq/L — AB (ref 96–112)
CO2: 13 meq/L — AB (ref 19–32)
CO2: 14 meq/L — AB (ref 19–32)
CREATININE: 4.99 mg/dL — AB (ref 0.50–1.35)
CREATININE: 5.32 mg/dL — AB (ref 0.50–1.35)
Calcium: 7.9 mg/dL — ABNORMAL LOW (ref 8.4–10.5)
Calcium: 8.2 mg/dL — ABNORMAL LOW (ref 8.4–10.5)
GFR calc Af Amer: 15 mL/min — ABNORMAL LOW (ref >90)
GFR calc Af Amer: 14 mL/min — ABNORMAL LOW (ref >90)
GFR calc non Af Amer: 13 mL/min — ABNORMAL LOW (ref >90)
GFR calc non Af Amer: 12 mL/min — ABNORMAL LOW (ref >90)
GLUCOSE: 290 mg/dL — AB (ref 70–99)
Glucose, Bld: 243 mg/dL — ABNORMAL HIGH (ref 70–99)
POTASSIUM: 5.1 meq/L (ref 3.7–5.3)
Potassium: 4.8 mEq/L (ref 3.7–5.3)
SODIUM: 128 meq/L — AB (ref 137–147)
Sodium: 126 mEq/L — ABNORMAL LOW (ref 137–147)

## 2013-12-25 LAB — CBC
HEMATOCRIT: 32.7 % — AB (ref 39.0–52.0)
HEMOGLOBIN: 10.7 g/dL — AB (ref 13.0–17.0)
MCH: 31.1 pg (ref 26.0–34.0)
MCHC: 32.7 g/dL (ref 30.0–36.0)
MCV: 95.1 fL (ref 78.0–100.0)
Platelets: 215 10*3/uL (ref 150–400)
RBC: 3.44 MIL/uL — ABNORMAL LOW (ref 4.22–5.81)
RDW: 13.5 % (ref 11.5–15.5)
WBC: 10.6 10*3/uL — AB (ref 4.0–10.5)

## 2013-12-25 LAB — EXPECTORATED SPUTUM ASSESSMENT W REFEX TO RESP CULTURE

## 2013-12-25 LAB — EXPECTORATED SPUTUM ASSESSMENT W GRAM STAIN, RFLX TO RESP C

## 2013-12-25 MED ORDER — PANTOPRAZOLE SODIUM 40 MG PO TBEC
40.0000 mg | DELAYED_RELEASE_TABLET | Freq: Every day | ORAL | Status: DC
  Administered 2013-12-25 – 2013-12-26 (×2): 40 mg via ORAL
  Filled 2013-12-25 (×2): qty 1

## 2013-12-25 MED ORDER — CLONIDINE HCL 0.1 MG PO TABS
0.1000 mg | ORAL_TABLET | Freq: Every day | ORAL | Status: DC
  Administered 2013-12-25 – 2013-12-27 (×3): 0.1 mg via ORAL
  Filled 2013-12-25 (×4): qty 1

## 2013-12-25 MED ORDER — FUROSEMIDE 10 MG/ML IJ SOLN
40.0000 mg | Freq: Once | INTRAMUSCULAR | Status: AC
  Administered 2013-12-25: 40 mg via INTRAVENOUS
  Filled 2013-12-25: qty 4

## 2013-12-25 MED ORDER — CALCIUM CARBONATE ANTACID 500 MG PO CHEW
1.0000 | CHEWABLE_TABLET | Freq: Two times a day (BID) | ORAL | Status: DC | PRN
  Administered 2013-12-25: 200 mg via ORAL
  Filled 2013-12-25 (×3): qty 1

## 2013-12-25 MED ORDER — HYDRALAZINE HCL 25 MG PO TABS
25.0000 mg | ORAL_TABLET | Freq: Two times a day (BID) | ORAL | Status: DC
  Filled 2013-12-25 (×2): qty 1

## 2013-12-25 NOTE — Progress Notes (Signed)
Echo Lab  2D Echocardiogram completed.  Vilas, RDCS 12/25/2013 8:27 AM

## 2013-12-25 NOTE — Progress Notes (Signed)
Echo has been performed but results are pending.  Cardiology to see as needed over the weekend. Please call with questions.

## 2013-12-25 NOTE — Progress Notes (Signed)
PROGRESS NOTE  Lance [name] [id number] DOB: 1970/02/20 DOA: 12/23/2013 PCP: [name], MD  Patient has type 1 DM, HLD, HTN, grade 2 diastolic CHF who presents to the ED with sob and cough that began 1-2 days prior to admit. Pt denies subjective fevers. In the ED, the patient was noted to have leukocytosis of 13k with tachycardia and CXR revealing RUL pneumonia. The patient was also noted to have glucose in the [id number] with metabolic acidosis and GAP of 24. The patient was started on abx, insulin gtt, and hospitalist service consulted for admission.  Assessment/Plan: DKA -resolved levemir and novolog- switched from Gtt  HTN -when placed on home meds, dropped BP- suspect patient was not taking all his meds at home, will d/c and place holding parameters on meds  Sepsis due to CAP Cont on rocephin and azithromycin IV Pan culture   ARF on CKD (baseline 2.2) Likely secondary to above IVF as tolerated- await today's labs  Elevated troponin Sinus tach on ekg Suspect possible trop leak in the setting of florid DKA and sepsis with pneumonia- trending down -echo ordered Cardiology consult  Anemia  Probably CKD   Monitor and transfuse as needed   Code Status: full Family Communication: patient Disposition Plan:    Consultants:  cardiology  Procedures:      HPI/Subjective: Feeling better, no CP, no SOB  Objective: Filed Vitals:   12/25/13 0450  BP: 98/60  Pulse: 91  Temp: 100.5 F (38.1 C)  Resp: 17    Intake/Output Summary (Last 24 hours) at 12/25/13 0933 Last data filed at 12/25/13 0542  Gross per 24 hour  Intake 2867.5 ml  Output      0 ml  Net 2867.5 ml   Filed Weights   12/23/13 1600 12/24/13 2050  Weight: 105.8 kg (233 lb 4 oz) 108.863 kg (240 lb)    Exam:   General:  A+Ox3, NAD  Cardiovascular: rrr  Respiratory: no wheezing  Abdomen: +Bs, soft  Musculoskeletal: moves all ext   Data Reviewed: Basic Metabolic  Panel:  Recent Labs Lab 12/23/13 1401 12/23/13 1850 12/23/13 2105 12/23/13 2258 12/24/13 0603  NA 129* 133* 136* 137 132*  K 5.0 4.0 4.3 4.0 4.2  CL 92* 98 103 105 102  CO2 13* 16* 19 20 18*  GLUCOSE 661* 438* 215* 116* 125*  BUN 43* 43* 42* 42* 41*  CREATININE 3.33* 3.40* 3.62* 3.73* 3.95*  CALCIUM 8.4 8.3* 8.3* 8.2* 8.1*   Liver Function Tests:  Recent Labs Lab 12/24/13 0603  AST 13  ALT 8  ALKPHOS 78  BILITOT <0.2*  PROT 5.7*  ALBUMIN 1.4*   No results for input(s): LIPASE, AMYLASE in the last 168 hours. No results for input(s): AMMONIA in the last 168 hours. CBC:  Recent Labs Lab 12/23/13 1401 12/23/13 1850 12/24/13 0603 12/25/13 0845  WBC 13.3* 14.8* 14.8* 10.6*  NEUTROABS 11.6*  --   --   --   HGB 12.7* 12.2* 10.8* 10.7*  HCT 38.6* 36.6* 32.4* 32.7*  MCV 95.3 92.0 91.5 95.1  PLT 252 278 247 215   Cardiac Enzymes:  Recent Labs Lab 12/23/13 1401 12/24/13 12/24/13 0603  TROPONINI <0.30 1.63* 1.01*   BNP (last 3 results)  Recent Labs  12/23/13 1338  PROBNP 6963.0*   CBG:  Recent Labs Lab 12/24/13 1652 12/24/13 1720 12/24/13 2050 12/25/13 0213 12/25/13 0802  GLUCAP 61* 71 101* 110* 246*    Recent Results (from the past 240 hour(s))  MRSA PCR Screening  Status: None   Collection Time: 12/23/13  2:12 PM  Result Value Ref Range Status   MRSA by PCR NEGATIVE NEGATIVE Final    Comment:        The GeneXpert MRSA Assay (FDA approved for NASAL specimens only), is one component of a comprehensive MRSA colonization surveillance program. It is not intended to diagnose MRSA infection nor to guide or monitor treatment for MRSA infections.   Culture, sputum-assessment     Status: None   Collection Time: 12/24/13 10:21 PM  Result Value Ref Range Status   Specimen Description SPUTUM  Final   Special Requests NONE  Final   Sputum evaluation   Final    MICROSCOPIC FINDINGS SUGGEST THAT THIS SPECIMEN IS NOT REPRESENTATIVE OF LOWER  RESPIRATORY SECRETIONS. PLEASE RECOLLECT. CALLED TO RN J.TEMBRINA AT 0150 BY L.PITT 12/25/13    Report Status 12/25/2013 FINAL  Final     Studies: Dg Chest Portable 1 View  12/23/2013   ADDENDUM REPORT: 12/23/2013 14:17  ADDENDUM: Impression should read left upper lobe pneumonia and not right   Electronically Signed   By: [name] M.D.   On: 12/23/2013 14:17   12/23/2013   CLINICAL DATA:  Cough and shortness of Breath  EXAM: PORTABLE CHEST - 1 VIEW  COMPARISON:  None.  FINDINGS: Cardiac shadow is mildly enlarged. Rounded area of infiltrate is noted in the left upper lobe consistent with acute pneumonia. Right lung is clear. No bony abnormality is noted.  IMPRESSION: Right upper lobe pneumonia. Followup films following appropriate therapy are recommended.  Electronically Signed: By: [name] M.D. On: 12/23/2013 14:02    Scheduled Meds: . azithromycin  500 mg Intravenous Q24H  . cefTRIAXone (ROCEPHIN)  IV  1 g Intravenous Q24H  . cloNIDine  0.1 mg Oral QHS  . heparin  5,000 Units Subcutaneous 3 times per day  . hydrALAZINE  25 mg Oral BID  . insulin aspart  0-5 Units Subcutaneous QHS  . insulin aspart  0-9 Units Subcutaneous TID WC  . insulin detemir  40 Units Subcutaneous QHS  . pantoprazole  40 mg Oral Daily  . sodium chloride  3 mL Intravenous Q12H  . verapamil  240 mg Oral QHS   Continuous Infusions:   Antibiotics Given (last 72 hours)    Date/Time Action Medication Dose Rate   12/24/13 1645 Given   cefTRIAXone (ROCEPHIN) 1 g in dextrose 5 % 50 mL IVPB 1 g 100 mL/hr   12/24/13 1735 Given   azithromycin (ZITHROMAX) 500 mg in dextrose 5 % 250 mL IVPB 500 mg 250 mL/hr      Principal Problem:   DKA (diabetic ketoacidoses) Active Problems:   Essential hypertension, benign   CAP (community acquired pneumonia)   Sepsis due to pneumonia   Metabolic acidemia   Acute renal failure   Diabetic ketoacidosis   Elevated troponin    Time spent: 25 min    Enas Winchel,  Seriah Brotzman  [facility] Pager [phone]. If 7PM-7AM, please contact night-coverage at [email or web address], password [facility] 12/25/2013, 9:33 AM  LOS: 2 days

## 2013-12-25 NOTE — Plan of Care (Signed)
Problem: Phase II Progression Outcomes Goal: CBGs stable on SQ insulin Outcome: Completed/Met Date Met:  12/25/13     

## 2013-12-26 DIAGNOSIS — E1310 Other specified diabetes mellitus with ketoacidosis without coma: Secondary | ICD-10-CM

## 2013-12-26 DIAGNOSIS — E1010 Type 1 diabetes mellitus with ketoacidosis without coma: Secondary | ICD-10-CM

## 2013-12-26 LAB — CBC
HCT: 32.4 % — ABNORMAL LOW (ref 39.0–52.0)
HEMOGLOBIN: 10.8 g/dL — AB (ref 13.0–17.0)
MCH: 31 pg (ref 26.0–34.0)
MCHC: 33.3 g/dL (ref 30.0–36.0)
MCV: 93.1 fL (ref 78.0–100.0)
Platelets: 242 10*3/uL (ref 150–400)
RBC: 3.48 MIL/uL — AB (ref 4.22–5.81)
RDW: 13.4 % (ref 11.5–15.5)
WBC: 11.5 10*3/uL — AB (ref 4.0–10.5)

## 2013-12-26 LAB — URINALYSIS, ROUTINE W REFLEX MICROSCOPIC
BILIRUBIN URINE: NEGATIVE
Glucose, UA: 100 mg/dL — AB
Ketones, ur: NEGATIVE mg/dL
Leukocytes, UA: NEGATIVE
Nitrite: NEGATIVE
PH: 5.5 (ref 5.0–8.0)
Protein, ur: >300 mg/dL — AB
SPECIFIC GRAVITY, URINE: 1.017 (ref 1.005–1.030)
Urobilinogen, UA: 0.2 mg/dL (ref 0.0–1.0)

## 2013-12-26 LAB — URINE MICROSCOPIC-ADD ON

## 2013-12-26 LAB — BASIC METABOLIC PANEL
Anion gap: 16 — ABNORMAL HIGH (ref 5–15)
BUN: 62 mg/dL — ABNORMAL HIGH (ref 6–23)
CALCIUM: 8.1 mg/dL — AB (ref 8.4–10.5)
CHLORIDE: 95 meq/L — AB (ref 96–112)
CO2: 16 mEq/L — ABNORMAL LOW (ref 19–32)
CREATININE: 5.24 mg/dL — AB (ref 0.50–1.35)
GFR calc Af Amer: 14 mL/min — ABNORMAL LOW (ref >90)
GFR calc non Af Amer: 12 mL/min — ABNORMAL LOW (ref >90)
GLUCOSE: 80 mg/dL (ref 70–99)
Potassium: 4.3 mEq/L (ref 3.7–5.3)
Sodium: 127 mEq/L — ABNORMAL LOW (ref 137–147)

## 2013-12-26 LAB — SODIUM, URINE, RANDOM

## 2013-12-26 LAB — TSH: TSH: 1.76 u[IU]/mL (ref 0.350–4.500)

## 2013-12-26 LAB — GLUCOSE, CAPILLARY
GLUCOSE-CAPILLARY: 41 mg/dL — AB (ref 70–99)
Glucose-Capillary: 72 mg/dL (ref 70–99)
Glucose-Capillary: 152 mg/dL — ABNORMAL HIGH (ref 70–99)
Glucose-Capillary: 92 mg/dL (ref 70–99)
Glucose-Capillary: 160 mg/dL — ABNORMAL HIGH (ref 70–99)

## 2013-12-26 LAB — HEMOGLOBIN A1C
Hgb A1c MFr Bld: 9.7 % — ABNORMAL HIGH (ref <5.7)
MEAN PLASMA GLUCOSE: 232 mg/dL — AB (ref <117)

## 2013-12-26 LAB — CREATININE, URINE, RANDOM: Creatinine, Urine: 184.47 mg/dL

## 2013-12-26 MED ORDER — INSULIN ASPART 100 UNIT/ML ~~LOC~~ SOLN
8.0000 [IU] | Freq: Three times a day (TID) | SUBCUTANEOUS | Status: DC
  Administered 2013-12-26: 8 [IU] via SUBCUTANEOUS

## 2013-12-26 MED ORDER — BENZONATATE 100 MG PO CAPS
200.0000 mg | ORAL_CAPSULE | Freq: Three times a day (TID) | ORAL | Status: DC
  Administered 2013-12-26 – 2013-12-29 (×10): 200 mg via ORAL
  Filled 2013-12-26 (×15): qty 2

## 2013-12-26 MED ORDER — SODIUM CHLORIDE 0.9 % IV SOLN
INTRAVENOUS | Status: DC
  Administered 2013-12-26 – 2013-12-28 (×3): via INTRAVENOUS

## 2013-12-26 MED ORDER — INSULIN DETEMIR 100 UNIT/ML ~~LOC~~ SOLN
35.0000 [IU] | Freq: Every day | SUBCUTANEOUS | Status: DC
  Administered 2013-12-26: 35 [IU] via SUBCUTANEOUS
  Filled 2013-12-26 (×2): qty 0.35

## 2013-12-26 MED ORDER — ACETAMINOPHEN 325 MG PO TABS
650.0000 mg | ORAL_TABLET | Freq: Three times a day (TID) | ORAL | Status: DC
  Administered 2013-12-26 – 2013-12-29 (×10): 650 mg via ORAL
  Filled 2013-12-26 (×10): qty 2

## 2013-12-26 NOTE — Progress Notes (Signed)
Chart reviewed.   PROGRESS NOTE  Lance [name] [id number] DOB: 03-Jul-1970 DOA: 12/23/2013 PCP: [name], MD  Patient has type 1 DM, HLD, HTN, grade 2 diastolic CHF who presents to the ED with sob and cough that began 1-2 days prior to admit. Pt denies subjective fevers. In the ED, the patient was noted to have leukocytosis of 13k with tachycardia and CXR revealing RUL pneumonia. The patient was also noted to have glucose in the Q000111Q with metabolic acidosis and GAP of 24. The patient was started on abx, insulin gtt, and hospitalist service consulted for admission.  Assessment/Plan: ARF on CKD likely stage III (baseline 2.2) Likely secondary to DKA, sepsis with pneumonia, hypotension,prerenal azotemia and ARB, thiazide, and loop diuretics that he had been taking prior to admission. Creatinine had trended up during hospitalization but seems to have plateaued. Will continue gentle IV hydration and recheck tomorrow. Check urinalysis, fractional excretion of sodium. Renal ultrasound shows no hydronephrosis, and does show medical renal disease. Patient reports that he has seen Dr. [name] once about a year ago and never returned.  If creatinine does not trend down by tomorrow significantly, will consult nephrology as an inpatient. Certainly will need referral back to nephrology as an outpatient.  DKA -resolved Due to noncompliance with insulin and pneumonia/sepsis Followed by Dr. [name] Check hgb A1C.   Has had lows in am. Will decrease Levemir to 35 units nightly. Stop nightly coverage of NovoLog. Will schedule NovoLog with meals at lower dose and stop sliding scale.  HTN Very high on admission, became hypotensive when placed on home meds, dropped BP- suspect patient was not taking all his meds at home, will d/c and place holding parameters on meds  Sepsis due to CAP Cont on rocephin and azithromycin IV Blood cultures remain negative. Urine legionella and strep pneumonia antigens  negative.  increase activity. Wean oxygen.  Elevated troponin No chest pain or significant EKG changes or echocardiogram changes. Has remained in normal sinus rhythm. Will discontinue telemetry. Not a candidate for cardiac catheterization due to chronic kidney disease. Further workup per cardiology.  Anemia  Probably CKD   stable  Code Status: full Family Communication: wife at bedside Disposition Plan:    Consultants:  cardiology  Procedures:    HPI/Subjective: Cough improving but complaining of abdominal soreness from cough. Has complaints about nursing staff.  no chest pain.  No dyspnea except during paroxysms of cough. Reports having lost his appetite 1-2 weeks ago and therefore stop taking his insulin altogether because he was worried about hypoglycemia. He confirms he is a type I diabetic.  Objective: Filed Vitals:   12/26/13 0521  BP: 117/70  Pulse: 87  Temp: 98.1 F (36.7 C)  Resp: 22    Intake/Output Summary (Last 24 hours) at 12/26/13 1002 Last data filed at 12/26/13 0747  Gross per 24 hour  Intake    720 ml  Output    775 ml  Net    -55 ml   Filed Weights   12/23/13 1600 12/24/13 2050 12/25/13 2147  Weight: 105.8 kg (233 lb 4 oz) 108.863 kg (240 lb) 107.548 kg (237 lb 1.6 oz)   telemetry: Normal sinus rhythm  Exam:   General:  A+Ox3, NAD  Cardiovascular: rrr Without murmurs gallops rubs  Respiratory: clear to auscultation no wheezing rhonchi or rales  Abdomen: +Bs, soft nontender nondistended  Extremities: No clubbing cyanosis or edema excellent  Psychiatric: Flat affect. Guarded.  Data Reviewed: Basic Metabolic Panel:  Recent  Labs Lab 12/23/13 2258 12/24/13 0603 12/25/13 0845 12/25/13 1940 12/26/13 0511  NA 137 132* 126* 128* 127*  K 4.0 4.2 5.1 4.8 4.3  CL 105 102 93* 94* 95*  CO2 20 18* 13* 14* 16*  GLUCOSE 116* 125* 290* 243* 80  BUN 42* 41* 50* 59* 62*  CREATININE 3.73* 3.95* 4.99* 5.32* 5.24*  CALCIUM 8.2* 8.1* 7.9* 8.2*  8.1*   Liver Function Tests:  Recent Labs Lab 12/24/13 0603  AST 13  ALT 8  ALKPHOS 78  BILITOT <0.2*  PROT 5.7*  ALBUMIN 1.4*   No results for input(s): LIPASE, AMYLASE in the last 168 hours. No results for input(s): AMMONIA in the last 168 hours. CBC:  Recent Labs Lab 12/23/13 1401 12/23/13 1850 12/24/13 0603 12/25/13 0845 12/26/13 0511  WBC 13.3* 14.8* 14.8* 10.6* 11.5*  NEUTROABS 11.6*  --   --   --   --   HGB 12.7* 12.2* 10.8* 10.7* 10.8*  HCT 38.6* 36.6* 32.4* 32.7* 32.4*  MCV 95.3 92.0 91.5 95.1 93.1  PLT 252 278 247 215 242   Cardiac Enzymes:  Recent Labs Lab 12/23/13 1401 12/24/13 12/24/13 0603  TROPONINI <0.30 1.63* 1.01*   BNP (last 3 results)  Recent Labs  12/23/13 1338  PROBNP 6963.0*   CBG:  Recent Labs Lab 12/25/13 1207 12/25/13 1701 12/25/13 2145 12/26/13 0744 12/26/13 0807  GLUCAP 263* 203* 234* 41* 72    Recent Results (from the past 240 hour(s))  MRSA PCR Screening     Status: None   Collection Time: 12/23/13  2:12 PM  Result Value Ref Range Status   MRSA by PCR NEGATIVE NEGATIVE Final    Comment:        The GeneXpert MRSA Assay (FDA approved for NASAL specimens only), is one component of a comprehensive MRSA colonization surveillance program. It is not intended to diagnose MRSA infection nor to guide or monitor treatment for MRSA infections.   Culture, blood (routine x 2) Call MD if unable to obtain prior to antibiotics being given     Status: None (Preliminary result)   Collection Time: 12/23/13  6:50 PM  Result Value Ref Range Status   Specimen Description BLOOD LEFT WRIST  Final   Special Requests BOTTLES DRAWN AEROBIC AND ANAEROBIC 10CC  Final   Culture  Setup Time   Final    12/24/2013 01:30 Performed at [facility]    Culture   Final           BLOOD CULTURE RECEIVED NO GROWTH TO DATE CULTURE WILL BE HELD FOR 5 DAYS BEFORE ISSUING A FINAL NEGATIVE REPORT Performed at [facility]     Report Status PENDING  Incomplete  Culture, blood (routine x 2) Call MD if unable to obtain prior to antibiotics being given     Status: None (Preliminary result)   Collection Time: 12/23/13  7:00 PM  Result Value Ref Range Status   Specimen Description BLOOD LEFT HAND  Final   Special Requests BOTTLES DRAWN AEROBIC AND ANAEROBIC 10CC  Final   Culture  Setup Time   Final    12/24/2013 01:30 Performed at [facility]    Culture   Final           BLOOD CULTURE RECEIVED NO GROWTH TO DATE CULTURE WILL BE HELD FOR 5 DAYS BEFORE ISSUING A FINAL NEGATIVE REPORT Performed at [facility]    Report Status PENDING  Incomplete  Culture, sputum-assessment  Status: None   Collection Time: 12/24/13 10:21 PM  Result Value Ref Range Status   Specimen Description SPUTUM  Final   Special Requests NONE  Final   Sputum evaluation   Final    MICROSCOPIC FINDINGS SUGGEST THAT THIS SPECIMEN IS NOT REPRESENTATIVE OF LOWER RESPIRATORY SECRETIONS. PLEASE RECOLLECT. CALLED TO RN J.TEMBRINA AT 0150 BY L.PITT 12/25/13    Report Status 12/25/2013 FINAL  Final     Studies: US Renal  12/25/2013   CLINICAL DATA:  Acute renal insufficiency, sepsis, pneumonia  EXAM: RENAL/URINARY TRACT ULTRASOUND COMPLETE  COMPARISON:  None.  FINDINGS: Right Kidney:  Length: 12.6 cm.  No hydronephrosis.  No renal calculi.  Left Kidney:  Length: 12.4 cm. No hydronephrosis. No renal calculi. Bilateral renal mild increased echogenicity suspicious for medical renal disease.  Bladder:  Appears normal for degree of bladder distention.  Bilateral pleural effusions.  IMPRESSION: 1. No hydronephrosis or renal calculi. Bilateral renal mild increased cortical echogenicity suspicious for medical renal disease. Unremarkable urinary bladder. Bilateral pleural effusion.   Electronically Signed   By: [name] M.D.   On: 12/25/2013 15:11    Scheduled Meds: . acetaminophen  650 mg Oral TID  . azithromycin  500 mg Intravenous Q24H   . benzonatate  200 mg Oral TID  . cefTRIAXone (ROCEPHIN)  IV  1 g Intravenous Q24H  . cloNIDine  0.1 mg Oral QHS  . heparin  5,000 Units Subcutaneous 3 times per day  . insulin aspart  8 Units Subcutaneous TID WC  . insulin detemir  35 Units Subcutaneous QHS  . pantoprazole  40 mg Oral Daily  . sodium chloride  3 mL Intravenous Q12H  . verapamil  240 mg Oral QHS   Continuous Infusions: . sodium chloride     Antibiotics Given (last 72 hours)    Date/Time Action Medication Dose Rate   12/24/13 1645 Given   cefTRIAXone (ROCEPHIN) 1 g in dextrose 5 % 50 mL IVPB 1 g 100 mL/hr   12/24/13 1735 Given   azithromycin (ZITHROMAX) 500 mg in dextrose 5 % 250 mL IVPB 500 mg 250 mL/hr   12/25/13 1629 Given   cefTRIAXone (ROCEPHIN) 1 g in dextrose 5 % 50 mL IVPB 1 g 100 mL/hr   12/25/13 1745 Given   azithromycin (ZITHROMAX) 500 mg in dextrose 5 % 250 mL IVPB 500 mg 250 mL/hr      Time spent: 35 min  [facility] Pager [phone]. If 7PM-7AM, please contact night-coverage at [email or web address], password [facility] 12/26/2013, 10:02 AM  LOS: 3 days

## 2013-12-26 NOTE — Plan of Care (Signed)
Problem: Phase I Progression Outcomes Goal: Diabetes Coordinator Consult Outcome: Completed/Met Date Met:  12/26/13 Goal: Pt. states reason for hospitalization Outcome: Completed/Met Date Met:  12/26/13

## 2013-12-26 NOTE — Plan of Care (Signed)
Problem: Phase I Progression Outcomes Goal: Acidosis resolving Outcome: Completed/Met Date Met:  12/26/13 Goal: K+ level approaching normal with therapy Outcome: Completed/Met Date Met:  12/26/13 Goal: Nausea/vomiting controlled with antiemetics Outcome: Completed/Met Date Met:  12/26/13 Goal: OOB as tolerated unless otherwise ordered Outcome: Completed/Met Date Met:  12/26/13 Goal: Initial discharge plan identified Outcome: Completed/Met Date Met:  12/26/13

## 2013-12-26 NOTE — Plan of Care (Signed)
Problem: Phase I Progression Outcomes Goal: Pain controlled with appropriate interventions Outcome: Completed/Met Date Met:  12/26/13     

## 2013-12-27 DIAGNOSIS — N179 Acute kidney failure, unspecified: Secondary | ICD-10-CM | POA: Diagnosis present

## 2013-12-27 DIAGNOSIS — E1010 Type 1 diabetes mellitus with ketoacidosis without coma: Secondary | ICD-10-CM | POA: Diagnosis present

## 2013-12-27 LAB — RENAL FUNCTION PANEL
Albumin: 1.3 g/dL — ABNORMAL LOW (ref 3.5–5.2)
Anion gap: 14 (ref 5–15)
BUN: 60 mg/dL — ABNORMAL HIGH (ref 6–23)
CALCIUM: 7.9 mg/dL — AB (ref 8.4–10.5)
CO2: 18 meq/L — AB (ref 19–32)
Chloride: 100 mEq/L (ref 96–112)
Creatinine, Ser: 4.54 mg/dL — ABNORMAL HIGH (ref 0.50–1.35)
GFR calc non Af Amer: 14 mL/min — ABNORMAL LOW (ref >90)
GFR, EST AFRICAN AMERICAN: 17 mL/min — AB (ref >90)
Glucose, Bld: 123 mg/dL — ABNORMAL HIGH (ref 70–99)
POTASSIUM: 3.9 meq/L (ref 3.7–5.3)
Phosphorus: 4.3 mg/dL (ref 2.3–4.6)
SODIUM: 132 meq/L — AB (ref 137–147)

## 2013-12-27 LAB — GLUCOSE, CAPILLARY
GLUCOSE-CAPILLARY: 101 mg/dL — AB (ref 70–99)
Glucose-Capillary: 96 mg/dL (ref 70–99)
Glucose-Capillary: 98 mg/dL (ref 70–99)
Glucose-Capillary: 59 mg/dL — ABNORMAL LOW (ref 70–99)
Glucose-Capillary: 67 mg/dL — ABNORMAL LOW (ref 70–99)
Glucose-Capillary: 82 mg/dL (ref 70–99)
Glucose-Capillary: 90 mg/dL (ref 70–99)

## 2013-12-27 MED ORDER — CEFUROXIME AXETIL 500 MG PO TABS
500.0000 mg | ORAL_TABLET | Freq: Two times a day (BID) | ORAL | Status: DC
  Administered 2013-12-27 – 2013-12-29 (×4): 500 mg via ORAL
  Filled 2013-12-27 (×6): qty 1

## 2013-12-27 MED ORDER — INSULIN DETEMIR 100 UNIT/ML ~~LOC~~ SOLN
30.0000 [IU] | Freq: Every day | SUBCUTANEOUS | Status: DC
  Filled 2013-12-27: qty 0.3

## 2013-12-27 MED ORDER — INSULIN ASPART 100 UNIT/ML ~~LOC~~ SOLN
5.0000 [IU] | Freq: Three times a day (TID) | SUBCUTANEOUS | Status: DC
  Administered 2013-12-27: 5 [IU] via SUBCUTANEOUS

## 2013-12-27 MED ORDER — INSULIN ASPART 100 UNIT/ML ~~LOC~~ SOLN: 5.0000 [IU] | Freq: Three times a day (TID) | SUBCUTANEOUS | Status: DC

## 2013-12-27 MED ORDER — INSULIN ASPART 100 UNIT/ML ~~LOC~~ SOLN: 2.0000 [IU] | Freq: Three times a day (TID) | SUBCUTANEOUS | Status: DC

## 2013-12-27 MED ORDER — INSULIN DETEMIR 100 UNIT/ML ~~LOC~~ SOLN
25.0000 [IU] | Freq: Every day | SUBCUTANEOUS | Status: DC
  Administered 2013-12-27: 25 [IU] via SUBCUTANEOUS
  Filled 2013-12-27: qty 0.25

## 2013-12-27 MED ORDER — AZITHROMYCIN 500 MG PO TABS
500.0000 mg | ORAL_TABLET | Freq: Every day | ORAL | Status: DC
  Administered 2013-12-27 – 2013-12-29 (×3): 500 mg via ORAL
  Filled 2013-12-27 (×3): qty 1

## 2013-12-27 NOTE — Plan of Care (Signed)
Problem: Phase III Progression Outcomes Goal: CBGs stable on SQ insulin Outcome: Completed/Met Date Met:  12/27/13 Goal: Tolerating PO carb modified diet Outcome: Completed/Met Date Met:  12/27/13     

## 2013-12-27 NOTE — Progress Notes (Signed)
PROGRESS NOTE  Lance [name] [id number] DOB: 16-Jul-1970 DOA: 12/23/2013 PCP: [name], MD  Patient has type 1 DM, HLD, HTN, grade 2 diastolic CHF who presents to the ED with sob and cough that began 1-2 days prior to admit. Pt denies subjective fevers. In the ED, the patient was noted to have leukocytosis of 13k with tachycardia and CXR revealing RUL pneumonia. The patient was also noted to have glucose in the Q000111Q with metabolic acidosis and GAP of 24. The patient was started on abx, insulin gtt, and hospitalist service consulted for admission.  Assessment/Plan: ARF on CKD likely stage III (baseline 2.2) Likely secondary to DKA, sepsis with pneumonia, hypotension,prerenal azotemia and ARB, thiazide, and loop diuretics that he had been taking prior to admission. Creatinine has plateau'ed and trending down. Will continue gentle IV hydration and monitor daily. Renal ultrasound shows no hydronephrosis, and does show medical renal disease. FENa >1%.  Has >300 protein in urine and low albumin. Will get 24 hour urine for protein r/o nephrotic syndrome. Patient reports that he has seen Dr. [name] once about a year ago and never returned.    DKA -resolved Due to noncompliance with insulin and pneumonia/sepsis Followed by Dr. [name] Hgb A1C 9.7.  Now with recurrent lows. Will hold meal coverage and decrease levemir to 25. Eating ok.  HTN Very high on admission, became hypotensive when placed on home meds, dropped BP- suspect patient was not taking all his meds at home. Normotensive now  Sepsis due to CAP Change to po abx Blood cultures remain negative. Urine legionella and strep pneumonia antigens negative.  Ambulating ok. Less cough and dyspnea  Elevated troponin No chest pain or significant EKG changes or echocardiogram changes. Has remained in normal sinus rhythm. Will discontinue telemetry. Not a candidate for cardiac catheterization due to chronic kidney disease. Further  workup per cardiology.  Anemia  Probably CKD   stable  Code Status: full Family Communication: wife at bedside Disposition Plan:    Consultants:  cardiology  Procedures:    HPI/Subjective: Cough, breathing better. Ambulating. Reports previous h/o proteinuria  Objective: Filed Vitals:   12/27/13 0929  BP: 135/66  Pulse: 78  Temp: 97.7 F (36.5 C)  Resp: 20    Intake/Output Summary (Last 24 hours) at 12/27/13 1556 Last data filed at 12/27/13 0931  Gross per 24 hour  Intake    490 ml  Output   1300 ml  Net   -810 ml   Filed Weights   12/24/13 2050 12/25/13 2147 12/26/13 2119  Weight: 108.863 kg (240 lb) 107.548 kg (237 lb 1.6 oz) 106.323 kg (234 lb 6.4 oz)   telemetry: Normal sinus rhythm  Exam:   General:  A+Ox3, NAD in chair  Cardiovascular: rrr Without murmurs gallops rubs  Respiratory: clear to auscultation no wheezing rhonchi or rales  Abdomen: +Bs, soft nontender nondistended  Extremities: trace edema  Psychiatric: affect normal today  Data Reviewed: Basic Metabolic Panel:  Recent Labs Lab 12/24/13 0603 12/25/13 0845 12/25/13 1940 12/26/13 0511 12/27/13 0423  NA 132* 126* 128* 127* 132*  K 4.2 5.1 4.8 4.3 3.9  CL 102 93* 94* 95* 100  CO2 18* 13* 14* 16* 18*  GLUCOSE 125* 290* 243* 80 123*  BUN 41* 50* 59* 62* 60*  CREATININE 3.95* 4.99* 5.32* 5.24* 4.54*  CALCIUM 8.1* 7.9* 8.2* 8.1* 7.9*  PHOS  --   --   --   --  4.3   Liver Function Tests:  Recent Labs Lab 12/24/13 0603 12/27/13 0423  AST 13  --   ALT 8  --   ALKPHOS 78  --   BILITOT <0.2*  --   PROT 5.7*  --   ALBUMIN 1.4* 1.3*   No results for input(s): LIPASE, AMYLASE in the last 168 hours. No results for input(s): AMMONIA in the last 168 hours. CBC:  Recent Labs Lab 12/23/13 1401 12/23/13 1850 12/24/13 0603 12/25/13 0845 12/26/13 0511  WBC 13.3* 14.8* 14.8* 10.6* 11.5*  NEUTROABS 11.6*  --   --   --   --   HGB 12.7* 12.2* 10.8* 10.7* 10.8*  HCT 38.6*  36.6* 32.4* 32.7* 32.4*  MCV 95.3 92.0 91.5 95.1 93.1  PLT 252 278 247 215 242   Cardiac Enzymes:  Recent Labs Lab 12/23/13 1401 12/24/13 12/24/13 0603  TROPONINI <0.30 1.63* 1.01*   BNP (last 3 results)  Recent Labs  12/23/13 1338  PROBNP 6963.0*   CBG:  Recent Labs Lab 12/27/13 0750 12/27/13 1222 12/27/13 1450 12/27/13 1504 12/27/13 1525  GLUCAP 96 98 59* 67* 82    Recent Results (from the past 240 hour(s))  MRSA PCR Screening     Status: None   Collection Time: 12/23/13  2:12 PM  Result Value Ref Range Status   MRSA by PCR NEGATIVE NEGATIVE Final    Comment:        The GeneXpert MRSA Assay (FDA approved for NASAL specimens only), is one component of a comprehensive MRSA colonization surveillance program. It is not intended to diagnose MRSA infection nor to guide or monitor treatment for MRSA infections.   Culture, blood (routine x 2) Call MD if unable to obtain prior to antibiotics being given     Status: None (Preliminary result)   Collection Time: 12/23/13  6:50 PM  Result Value Ref Range Status   Specimen Description BLOOD LEFT WRIST  Final   Special Requests BOTTLES DRAWN AEROBIC AND ANAEROBIC 10CC  Final   Culture  Setup Time   Final    12/24/2013 01:30 Performed at [facility]    Culture   Final           BLOOD CULTURE RECEIVED NO GROWTH TO DATE CULTURE WILL BE HELD FOR 5 DAYS BEFORE ISSUING A FINAL NEGATIVE REPORT Performed at [facility]    Report Status PENDING  Incomplete  Culture, blood (routine x 2) Call MD if unable to obtain prior to antibiotics being given     Status: None (Preliminary result)   Collection Time: 12/23/13  7:00 PM  Result Value Ref Range Status   Specimen Description BLOOD LEFT HAND  Final   Special Requests BOTTLES DRAWN AEROBIC AND ANAEROBIC 10CC  Final   Culture  Setup Time   Final    12/24/2013 01:30 Performed at [facility]    Culture   Final           BLOOD CULTURE RECEIVED NO  GROWTH TO DATE CULTURE WILL BE HELD FOR 5 DAYS BEFORE ISSUING A FINAL NEGATIVE REPORT Performed at [facility]    Report Status PENDING  Incomplete  Culture, sputum-assessment     Status: None   Collection Time: 12/24/13 10:21 PM  Result Value Ref Range Status   Specimen Description SPUTUM  Final   Special Requests NONE  Final   Sputum evaluation   Final    MICROSCOPIC FINDINGS SUGGEST THAT THIS SPECIMEN IS NOT REPRESENTATIVE OF LOWER RESPIRATORY SECRETIONS. PLEASE RECOLLECT. CALLED TO  RN J.TEMBRINA AT [id number] BY L.PITT 12/25/13    Report Status 12/25/2013 FINAL  Final     Studies: No results found.  Scheduled Meds: . acetaminophen  650 mg Oral TID  . azithromycin  500 mg Intravenous Q24H  . benzonatate  200 mg Oral TID  . cefTRIAXone (ROCEPHIN)  IV  1 g Intravenous Q24H  . cloNIDine  0.1 mg Oral QHS  . heparin  5,000 Units Subcutaneous 3 times per day  . insulin aspart  2 Units Subcutaneous TID WC  . insulin detemir  30 Units Subcutaneous QHS  . sodium chloride  3 mL Intravenous Q12H  . verapamil  240 mg Oral QHS   Continuous Infusions: . sodium chloride 50 mL/hr at 12/27/13 1034   Antibiotics Given (last 72 hours)    Date/Time Action Medication Dose Rate   12/24/13 1645 Given   cefTRIAXone (ROCEPHIN) 1 g in dextrose 5 % 50 mL IVPB 1 g 100 mL/hr   12/24/13 1735 Given   azithromycin (ZITHROMAX) 500 mg in dextrose 5 % 250 mL IVPB 500 mg 250 mL/hr   12/25/13 1629 Given   cefTRIAXone (ROCEPHIN) 1 g in dextrose 5 % 50 mL IVPB 1 g 100 mL/hr   12/25/13 1745 Given   azithromycin (ZITHROMAX) 500 mg in dextrose 5 % 250 mL IVPB 500 mg 250 mL/hr   12/26/13 1601 Given   azithromycin (ZITHROMAX) 500 mg in dextrose 5 % 250 mL IVPB 500 mg 250 mL/hr   12/26/13 1743 Given   cefTRIAXone (ROCEPHIN) 1 g in dextrose 5 % 50 mL IVPB 1 g 100 mL/hr      Time spent: 35 min  [facility] Pager [phone]. If 7PM-7AM, please contact night-coverage at  [email or web address], password [facility] 12/27/2013, 3:56 PM  LOS: 4 days

## 2013-12-27 NOTE — Progress Notes (Signed)
Inpatient Diabetes Program Recommendations  AACE/ADA: New Consensus Statement on Inpatient Glycemic Control (2013)  Target Ranges:  Prepandial:   less than 140 mg/dL      Peak postprandial:   less than 180 mg/dL (1-2 hours)      Critically ill patients:  140 - 180 mg/dL   Results for JELAN, BOWSHER (MRN [id number]) as of 12/27/2013 11:59  Ref. Range 12/26/2013 08:07 12/26/2013 11:45 12/26/2013 16:34 12/26/2013 21:17 12/27/2013 07:50  Glucose-Capillary Latest Range: 70-99 mg/dL 72 152 (H) 92 160 (H) 96    Reason for assessment: labile CBG  Diabetes history: Type 1  Outpatient Diabetes medications: Novolog 10 units tid with meals, Levemir 40 units daily Current orders for Inpatient glycemic control: Levemir 35 units daily qhs (ordered yesterday and started last night), Novolog 8 units tid with meals.    May want to consider deceasing mealtime insulin to Novolog 3 units and adding sensitive correction tid with meals.  Patient refused Novolog insulin this am because his CBG was 96mg /dl.  CBG 91mg /dl now, pre-lunch without any meal coverage.  Consider decreasing Levemir to 30 units qhs based on fasting CBG of 96mg /dl.    Spoke with RN who will notify MD and will discuss pre-meal Novolog.   Gentry Fitz, RN, BA, MHA, CDE Diabetes Coordinator Inpatient Diabetes Program  [phone] (Team Pager) [phone] [facility]) 12/27/2013 12:37 PM

## 2013-12-27 NOTE — Progress Notes (Signed)
Patient Profile: Lance [name] is a 43 y.o. male admitted 12-23-13 with DKA in setting of type I DM, CAP, acute on chronic renal failure, and found to have elevated troponin (peak 1.63) with normal echo.   SUBJECTIVE: The patient feels he is improving but still not ready for stress test.  Denies exertional chest pain prior to admission, no shortness of breath prior to acute illness. Non-productive cough. No fevers, chills. Wants to go home.   *Saw renal MD in [place] once, but did not like him and does not want to go back. Willing to see Renal MD in [place].  CURRENT MEDICATIONS: . acetaminophen  650 mg Oral TID  . azithromycin  500 mg Intravenous Q24H  . benzonatate  200 mg Oral TID  . cefTRIAXone (ROCEPHIN)  IV  1 g Intravenous Q24H  . cloNIDine  0.1 mg Oral QHS  . heparin  5,000 Units Subcutaneous 3 times per day  . insulin aspart  8 Units Subcutaneous TID WC  . insulin detemir  35 Units Subcutaneous QHS  . sodium chloride  3 mL Intravenous Q12H  . verapamil  240 mg Oral QHS   . sodium chloride 50 mL/hr at 12/26/13 1142    OBJECTIVE: Physical Exam: Filed Vitals:   12/26/13 1753 12/26/13 2119 12/27/13 0520 12/27/13 0929  BP: 130/84 152/82 144/72 135/66  Pulse: 93 107 83 78  Temp: 98.4 F (36.9 C) 98.4 F (36.9 C) 98.3 F (36.8 C) 97.7 F (36.5 C)  TempSrc: Oral Oral Oral Oral  Resp: 22 20 20 20   Height:  6\' 3"  (1.905 m)    Weight:  234 lb 6.4 oz (106.323 kg)    SpO2: 92% 92% 93% 95%    Intake/Output Summary (Last 24 hours) at 12/27/13 0931 Last data filed at 12/27/13 0931  Gross per 24 hour  Intake    610 ml  Output   1500 ml  Net   -890 ml   Telemetry - not currently on telemetry  GEN- The patient is well appearing, alert and oriented x 3 today.   Head- normocephalic, atraumatic Eyes-  Sclera clear, conjunctiva pink Ears- hearing intact Lungs- Coarse rhonchi on left, slightly increased work of breathing Heart- Regular rate and rhythm, no murmurs, rubs or  gallops, PMI not laterally displaced GI- soft, NT, ND, + BS Extremities- no clubbing, cyanosis, or edema Skin- no rash or lesion Psych- euthymic mood, full affect Neuro- strength and sensation are intact  LABS: Basic Metabolic Panel:  Recent Labs  12/26/13 0511 12/27/13 0423  NA 127* 132*  K 4.3 3.9  CL 95* 100  CO2 16* 18*  GLUCOSE 80 123*  BUN 62* 60*  CREATININE 5.24* 4.54*  CALCIUM 8.1* 7.9*  PHOS  --  4.3   Liver Function Tests:  Recent Labs  12/27/13 0423  ALBUMIN 1.3*   CBC:  Recent Labs  12/25/13 0845 12/26/13 0511  WBC 10.6* 11.5*  HGB 10.7* 10.8*  HCT 32.7* 32.4*  MCV 95.1 93.1  PLT 215 242   Hemoglobin A1C:  Recent Labs  12/26/13 0511  HGBA1C 9.7*   Thyroid Function Tests:  Recent Labs  12/26/13 1105  TSH 1.760    RADIOLOGY: US Renal 12/25/2013   CLINICAL DATA:  Acute renal insufficiency, sepsis, pneumonia  EXAM: RENAL/URINARY TRACT ULTRASOUND COMPLETE  COMPARISON:  None.  FINDINGS: Right Kidney:  Length: 12.6 cm.  No hydronephrosis.  No renal calculi.  Left Kidney:  Length: 12.4 cm. No hydronephrosis. No renal calculi.  Bilateral renal mild increased echogenicity suspicious for medical renal disease.  Bladder:  Appears normal for degree of bladder distention.  Bilateral pleural effusions.  IMPRESSION: 1. No hydronephrosis or renal calculi. Bilateral renal mild increased cortical echogenicity suspicious for medical renal disease. Unremarkable urinary bladder. Bilateral pleural effusion.   Electronically Signed   By: [name] M.D.   On: 12/25/2013 15:11    12/23/2013   CLINICAL DATA:  Cough and shortness of Breath  EXAM: PORTABLE CHEST - 1 VIEW  COMPARISON:  None.  FINDINGS: Cardiac shadow is mildly enlarged. Rounded area of infiltrate is noted in the left upper lobe consistent with acute pneumonia. Right lung is clear. No bony abnormality is noted.  IMPRESSION: Left upper lobe pneumonia. Followup films following appropriate therapy are  recommended.  Electronically Signed: By: [name] M.D. On: 12/23/2013 14:02    ASSESSMENT AND PLAN:  Principal Problem:   DKA (diabetic ketoacidoses) Active Problems:   Essential hypertension, benign   CAP (community acquired pneumonia)   Sepsis due to pneumonia   Metabolic acidemia   Acute renal failure   Diabetic ketoacidosis   Elevated troponin  1.  Elevated troponin No chest pain He has been active prior to admission with no chest pain or shortness of breath with activity.  Echo with normal EF and no RWMA (no diastolic heart failure noted on echo this admission - grade 2 diastolic dysfunction 0000000) Plan myoview prior to discharge  2. History of diastolic heart failure Home medications included Furosemide Weight trend unclear I/O +2.4L this admission, but may have been dry on admission Possible slight volume overloaded on exam  3.  Acute on chronic renal failure Creatinine now trending down but not at baseline (baseline 2.2) Hyzaar and Furosemide being held He was seen by nephrologist (Befekadu) in [place] approximately 1 year ago but has not had follow up.  He is interested in referral to group in [place].   4.  Hypertension BP average 130/70's Continue Clonidine and Verapamil  5.  DKA with Type 1 diabetes Management per IM  6.  Sepsis/CAP Management per IM  Chanetta Marshall, NP student 12/27/2013 9:48 AM  Patient seen and examined with NP-S Chanetta Marshall. Changes made where appropriate.  Rosaria Ferries, PA-C 12/27/2013 10:16 AM Beeper [phone]    I have examined the patient and reviewed assessment and plan and discussed with patient.  Agree with above as stated.  Still SHOB on exam.  When resp status better, will decide about pharmacologic stress test.  Hopefully, renal function will improve as well.  Patient with type 1 DM at age 80.  Normal LV function by echo.  Dreon Pineda S.

## 2013-12-27 NOTE — Clinical Documentation Improvement (Signed)
Please specify the diagnosis related to the below supporting information if appropriate.   Possible Clinical Conditions Lance [name]                                 Other Condition___________________                 Cannot Clinically Determine_________   Supporting Information:  Per 12/26/13 MD progress note =  ARF on CKD likely stage III (baseline 2.2)  Likely secondary to DKA, sepsis with pneumonia, hypotension,prerenal azotemia and ARB, thiazide, and loop diuretics that he had been taking prior to admission. Creatinine had trended up during hospitalization but seems to have plateaued. Will continue gentle IV hydration and recheck tomorrow. Check urinalysis, fractional excretion of sodium. Renal ultrasound shows no hydronephrosis, and does show medical renal disease. Patient reports that he has seen Dr. [name] once about a year ago and never returned. If creatinine does not trend down by tomorrow significantly, will consult nephrology as an inpatient. Certainly will need referral back to nephrology as an outpatient.    Patient's labs during this admission   Component     Latest Ref Rng 12/23/2013         2:01 PM  Sodium     137 - 147 mEq/L 129 (L)   Component     Latest Ref Rng 12/23/2013         6:50 PM  Sodium     137 - 147 mEq/L 133 (L)   Component     Latest Ref Rng 12/23/2013         9:05 PM  Sodium     137 - 147 mEq/L 136 (L)   Component     Latest Ref Rng 12/23/2013        10:58 PM  Sodium     137 - 147 mEq/L 137    Component     Latest Ref Rng 12/24/2013         6:03 AM  Sodium     137 - 147 mEq/L 132 (L)    Component     Latest Ref Rng 12/25/2013         8:45 AM  Sodium     137 - 147 mEq/L 126 (L)   Component     Latest Ref Rng 12/25/2013         7:40 PM  Sodium     137 - 147 mEq/L 128 (L)   Component     Latest Ref Rng 12/26/2013         5:11 AM  Sodium     137 - 147 mEq/L 127 (L)    Component     Latest Ref Rng 12/27/2013          Sodium     137 - 147 mEq/L 132 (L)   Treatment = IVF   Thank Kris Hartmann ,RN Clinical Documentation Specialist:  [place] Information Management

## 2013-12-27 NOTE — Plan of Care (Signed)
Problem: Phase II Progression Outcomes Goal: Monitor hydration status Outcome: Completed/Met Date Met:  12/27/13 Goal: Potassium level normalizing Outcome: Completed/Met Date Met:  12/27/13 Goal: Nausea & vomiting resolved Outcome: Completed/Met Date Met:  12/27/13 Goal: Progress activity as tolerated unless otherwise ordered Outcome: Completed/Met Date Met:  12/27/13 Goal: Discharge plan established Outcome: Completed/Met Date Met:  12/27/13 Goal: Other Phase II Outcomes/Goals Outcome: Completed/Met Date Met:  12/27/13

## 2013-12-28 ENCOUNTER — Inpatient Hospital Stay (HOSPITAL_COMMUNITY): Payer: BC Managed Care – PPO

## 2013-12-28 DIAGNOSIS — R0600 Dyspnea, unspecified: Secondary | ICD-10-CM | POA: Insufficient documentation

## 2013-12-28 DIAGNOSIS — I5031 Acute diastolic (congestive) heart failure: Secondary | ICD-10-CM

## 2013-12-28 DIAGNOSIS — E162 Hypoglycemia, unspecified: Secondary | ICD-10-CM

## 2013-12-28 DIAGNOSIS — E871 Hypo-osmolality and hyponatremia: Secondary | ICD-10-CM

## 2013-12-28 LAB — GLUCOSE, CAPILLARY
GLUCOSE-CAPILLARY: 71 mg/dL (ref 70–99)
Glucose-Capillary: 70 mg/dL (ref 70–99)
Glucose-Capillary: 68 mg/dL — ABNORMAL LOW (ref 70–99)
Glucose-Capillary: 62 mg/dL — ABNORMAL LOW (ref 70–99)
Glucose-Capillary: 114 mg/dL — ABNORMAL HIGH (ref 70–99)
Glucose-Capillary: 172 mg/dL — ABNORMAL HIGH (ref 70–99)
Glucose-Capillary: 221 mg/dL — ABNORMAL HIGH (ref 70–99)

## 2013-12-28 LAB — RENAL FUNCTION PANEL
Albumin: 1.1 g/dL — ABNORMAL LOW (ref 3.5–5.2)
Anion gap: 14 (ref 5–15)
BUN: 49 mg/dL — ABNORMAL HIGH (ref 6–23)
CALCIUM: 7.8 mg/dL — AB (ref 8.4–10.5)
CO2: 18 mEq/L — ABNORMAL LOW (ref 19–32)
CREATININE: 3.62 mg/dL — AB (ref 0.50–1.35)
Chloride: 101 mEq/L (ref 96–112)
GFR calc Af Amer: 22 mL/min — ABNORMAL LOW (ref >90)
GFR calc non Af Amer: 19 mL/min — ABNORMAL LOW (ref >90)
GLUCOSE: 75 mg/dL (ref 70–99)
PHOSPHORUS: 3.8 mg/dL (ref 2.3–4.6)
Potassium: 3.6 mEq/L — ABNORMAL LOW (ref 3.7–5.3)
SODIUM: 133 meq/L — AB (ref 137–147)

## 2013-12-28 LAB — CBC
HCT: 34.7 % — ABNORMAL LOW (ref 39.0–52.0)
HEMOGLOBIN: 11.6 g/dL — AB (ref 13.0–17.0)
MCH: 30.7 pg (ref 26.0–34.0)
MCHC: 33.4 g/dL (ref 30.0–36.0)
MCV: 91.8 fL (ref 78.0–100.0)
Platelets: 359 10*3/uL (ref 150–400)
RBC: 3.78 MIL/uL — ABNORMAL LOW (ref 4.22–5.81)
RDW: 13.2 % (ref 11.5–15.5)
WBC: 7.9 10*3/uL (ref 4.0–10.5)

## 2013-12-28 LAB — PROTEIN, URINE, 24 HOUR
COLLECTION INTERVAL-UPROT: 24 h
PROTEIN 24H UR: 13175 mg/d — AB (ref 50–100)
PROTEIN, URINE: 527 mg/dL
URINE TOTAL VOLUME-UPROT: 2500 mL

## 2013-12-28 LAB — CREATININE, URINE, 24 HOUR
Collection Interval-UCRE24: 24 hours
Creatinine, 24H Ur: 1997 mg/d (ref 800–2000)
Creatinine, Urine: 79.86 mg/dL
Urine Total Volume-UCRE24: 2500 mL

## 2013-12-28 LAB — LIPID PANEL
Cholesterol: 174 mg/dL (ref 0–200)
HDL: 39 mg/dL — ABNORMAL LOW (ref >39)
LDL Cholesterol: 89 mg/dL (ref 0–99)
TRIGLYCERIDES: 229 mg/dL — AB (ref <150)
Total CHOL/HDL Ratio: 4.5 RATIO
VLDL: 46 mg/dL — AB (ref 0–40)

## 2013-12-28 MED ORDER — CARVEDILOL 6.25 MG PO TABS
6.2500 mg | ORAL_TABLET | Freq: Two times a day (BID) | ORAL | Status: DC
  Administered 2013-12-28 – 2013-12-29 (×2): 6.25 mg via ORAL
  Filled 2013-12-28 (×4): qty 1

## 2013-12-28 MED ORDER — POTASSIUM CHLORIDE CRYS ER 20 MEQ PO TBCR
20.0000 meq | EXTENDED_RELEASE_TABLET | Freq: Once | ORAL | Status: AC
  Administered 2013-12-28: 20 meq via ORAL
  Filled 2013-12-28: qty 1

## 2013-12-28 MED ORDER — FUROSEMIDE 10 MG/ML IJ SOLN
40.0000 mg | Freq: Once | INTRAMUSCULAR | Status: AC
  Administered 2013-12-28: 40 mg via INTRAVENOUS
  Filled 2013-12-28: qty 4

## 2013-12-28 MED ORDER — CLONIDINE HCL 0.2 MG PO TABS
0.2000 mg | ORAL_TABLET | Freq: Every day | ORAL | Status: DC
  Administered 2013-12-28: 0.2 mg via ORAL
  Filled 2013-12-28 (×2): qty 1

## 2013-12-28 MED ORDER — INSULIN DETEMIR 100 UNIT/ML ~~LOC~~ SOLN
15.0000 [IU] | Freq: Every day | SUBCUTANEOUS | Status: DC
  Administered 2013-12-28: 15 [IU] via SUBCUTANEOUS
  Filled 2013-12-28 (×2): qty 0.15

## 2013-12-28 NOTE — Progress Notes (Signed)
Patient Name: Lance [name] Date of Encounter: 12/28/2013  Principal Problem:   DKA (diabetic ketoacidoses) Active Problems:   Essential hypertension, benign   CAP (community acquired pneumonia)   Sepsis due to pneumonia   Metabolic acidemia   Demand ischemia   Acute renal failure syndrome   Diabetic ketoacidosis without coma associated with type 1 diabetes mellitus   Hyponatremia   Primary Cardiologist: Dr Irish Lack  Patient Profile: Lance [name] is a 43 y.o. male admitted 12-23-13 with DKA in setting of type I DM, CAP, acute on chronic renal failure, and found to have elevated troponin (peak 1.63) with normal echo.   SUBJECTIVE: No chest pain, no shortness of breath, ambulating well. He is extremely reluctant to undergo a chemical stress test because he states he has heard really bad things about the medication. He is willing to do a treadmill test but does not feel his breathing is improved enough to do it at this time.  OBJECTIVE Filed Vitals:   12/27/13 0929 12/27/13 1803 12/27/13 2120 12/28/13 0458  BP: 135/66 140/72 166/89 130/82  Pulse: 78  98 86  Temp: 97.7 F (36.5 C) 98.4 F (36.9 C) 98.3 F (36.8 C) 98.6 F (37 C)  TempSrc: Oral Oral Oral Oral  Resp: 20 20 18 19   Height:      Weight:      SpO2: 95% 98% 90% 94%    Intake/Output Summary (Last 24 hours) at 12/28/13 0927 Last data filed at 12/28/13 0618  Gross per 24 hour  Intake   1530 ml  Output   1225 ml  Net    305 ml   Filed Weights   12/24/13 2050 12/25/13 2147 12/26/13 2119  Weight: 240 lb (108.863 kg) 237 lb 1.6 oz (107.548 kg) 234 lb 6.4 oz (106.323 kg)    PHYSICAL EXAM General: Well developed, well nourished, male in no acute distress. Head: Normocephalic, atraumatic.  Neck: Supple without bruits, JVD minimal elevation Lungs:  Resp regular and unlabored, Rales noted on the left. Heart: RRR, S1, S2, no S3, S4, or murmur; no rub. Abdomen: Soft, non-tender, non-distended, BS + x  4.  Extremities: No clubbing, cyanosis, 1+ edema.  Neuro: Alert and oriented X 3. Moves all extremities spontaneously. Psych: Normal affect.  LABS: CBC:  Recent Labs  12/26/13 0511 12/28/13 0707  WBC 11.5* 7.9  HGB 10.8* 11.6*  HCT 32.4* 34.7*  MCV 93.1 91.8  PLT 242 [id number]   Basic Metabolic Panel:  Recent Labs  12/27/13 0423 12/28/13 0707  NA 132* 133*  K 3.9 3.6*  CL 100 101  CO2 18* 18*  GLUCOSE 123* 75  BUN 60* 49*  CREATININE 4.54* 3.62*  CALCIUM 7.9* 7.8*  PHOS 4.3 3.8   Liver Function Tests:  Recent Labs  12/27/13 0423 12/28/13 0707  ALBUMIN 1.3* 1.1*   BNP: PRO B NATRIURETIC PEPTIDE (BNP)  Date/Time Value Ref Range Status  12/23/2013 01:38 PM 6963.0* 0 - 125 pg/mL Final  12/22/2012 07:57 AM 416.8* 0 - 125 pg/mL Final   Hemoglobin A1C:  Recent Labs  12/26/13 0511  HGBA1C 9.7*   Fasting Lipid Panel:  Recent Labs  12/28/13 0707  CHOL 174  HDL 39*  LDLCALC 89  TRIG 229*  CHOLHDL 4.5   Thyroid Function Tests:  Recent Labs  12/26/13 1105  TSH 1.760   TELE:  Not on    Current Medications:  . acetaminophen  650 mg Oral TID  . azithromycin  500  mg Oral Daily  . benzonatate  200 mg Oral TID  . cefUROXime  500 mg Oral BID WC  . cloNIDine  0.1 mg Oral QHS  . heparin  5,000 Units Subcutaneous 3 times per day  . insulin detemir  15 Units Subcutaneous QHS  . sodium chloride  3 mL Intravenous Q12H  . verapamil  240 mg Oral QHS   . sodium chloride 50 mL/hr at 12/28/13 0530    ASSESSMENT AND PLAN: Principal Problem:   DKA (diabetic ketoacidoses) Active Problems:   Essential hypertension, benign   CAP (community acquired pneumonia)   Sepsis due to pneumonia   Metabolic acidemia   Demand ischemia   Acute renal failure syndrome   Diabetic ketoacidosis without coma associated with type 1 diabetes mellitus   Hyponatremia  1. Demand ischemia - Elevated troponin No chest pain He has been active prior to admission with no chest pain  or shortness of breath with activity.  Echo with normal EF and no RWMA (no diastolic heart failure noted on echo this admission - grade 2 diastolic dysfunction 0000000) Plan myoview after to discharge, as he prefers a treadmill test. He is refusing a chemical stress test at this time.  2. History of diastolic heart failure Home medications included torosemide Weight trend unclear I/O +2.3L this admission, but may have been dry on admission Lower extremity edema noted on exam, need to follow daily weights  3. Acute on chronic renal failure Creatinine now trending down but not at baseline (baseline 2.2) Demadex and losartan/HCTZ being held He was seen by nephrologist in [place] approximately 1 year ago but has not had follow up. He is interested in referral to group in [place]. Per IM  4. Hypertension SBP range 130s-160s last 24 hours Per IM On Clonidine and Verapamil, however, clonidine is ordered QD, not BID, and verapamil may contribute to edema. Med changes per IM MD. Consider restart of Coreg, PTA prescribed 50 mg twice a day, but would restart at a much lower dose Reviewed with him that the less expensive medications tended to be twice a day and he understands this. Encouraged compliance.  5. DKA with Type 1 diabetes Management per IM  6. Sepsis/CAP Management per IM, improving  Signed, Rosaria Ferries , PA-C 9:27 AM 12/28/2013  I have examined the patient and reviewed assessment and plan and discussed with patient.  Agree with above as stated.  Patient is sure he does not want a chemical stress test.  Given his positive troponin, would plan on waiting 4-6 weeks for exercise nuclear stress test as OP.  Exercise stress now could worsen potential injury to the myocardium.    Increased swelling.  Plan on decreasing IV fluids.  Renal function improving. Restart low dose Coreg and titrate.  Orvilla Truett S.

## 2013-12-28 NOTE — Plan of Care (Signed)
Problem: Phase I Progression Outcomes Goal: Dyspnea controlled at rest Outcome: Completed/Met Date Met:  12/28/13 Goal: OOB as tolerated unless otherwise ordered Outcome: Completed/Met Date Met:  12/28/13 Goal: Voiding-avoid urinary catheter unless indicated Outcome: Completed/Met Date Met:  12/28/13     

## 2013-12-28 NOTE — Progress Notes (Signed)
Hypoglycemic Event  CBG: 68  Treatment: Cranberry Juice   Symptoms: None  Follow-up CBG: [id number] CBG Result:114  Possible Reasons for Event: Unknown  Comments/MD notified: MD aware     Augusta Hilbert A  Remember to initiate Hypoglycemia Order Set & complete

## 2013-12-28 NOTE — Progress Notes (Addendum)
PROGRESS NOTE  Lance [name] [id number] DOB: [birth year] DOA: 12/23/2013 PCP: [name], MD  Patient has type 1 DM, HLD, HTN, grade 2 diastolic CHF who presents to the ED with sob and cough that began 1-2 days prior to admit. Pt denies subjective fevers. In the ED, the patient was noted to have leukocytosis of 13k with tachycardia and CXR revealing RUL pneumonia. The patient was also noted to have glucose in the [id number] with metabolic acidosis and GAP of 24. The patient was started on abx, insulin gtt, and hospitalist service consulted for admission.  Assessment/Plan: ARF on CKD likely stage III (baseline 2.2) Likely secondary to DKA, sepsis with pneumonia, hypotension,prerenal azotemia and ARB, thiazide, and loop diuretics that he had been taking prior to admission. Improving.  Renal ultrasound shows no hydronephrosis, and does show medical renal disease. FENa <1%.  Has >300 protein in urine and low albumin. 24 hour urine for protein r/o nephrotic syndrome underway. Patient reports that he has seen Dr. [name] once about a year ago and never returned.  Developing swelling and orthopnea. D/c IVF. Give lasix IV x1, repeat CXR. sats ok.  DKA -resolved Due to noncompliance with insulin and pneumonia/sepsis Followed by Dr. [name] Hgb A1C 9.7.  still with recurrent lows despite cutting way back on insulin. Will decrease further and monitor  HTN Very high on admission, became hypotensive when placed on home meds, dropped BP- suspect patient was not taking all his meds at home. Normotensive now. Discussed with cardiology. Will stop verapamil due to chronic edema. And uptitrate coreg. No ACE or ARB for now  Sepsis/ CAP Changed to po abx Blood cultures remain negative. Urine legionella and strep pneumonia antigens negative.  Ambulating ok. Less cough and dyspnea  Elevated troponin No chest pain or significant EKG changes or echocardiogram changes. Has remained in normal sinus  rhythm. Will discontinue telemetry. Not a candidate for cardiac catheterization due to chronic kidney disease. Out patient treadmill nuclear stress test  Anemia  Probably CKD   stable  Code Status: full Family Communication:  Disposition Plan:    Consultants:  cardiology  Procedures:    HPI/Subjective: No cough. Some orthopnea last night. Leg swelling developing.  Objective: Filed Vitals:   12/28/13 1030  BP: 138/75  Pulse: 89  Temp: 97.7 F (36.5 C)  Resp: 19    Intake/Output Summary (Last 24 hours) at 12/28/13 1343 Last data filed at 12/28/13 1030  Gross per 24 hour  Intake   1600 ml  Output   1175 ml  Net    425 ml   Filed Weights   12/24/13 2050 12/25/13 2147 12/26/13 2119  Weight: 108.863 kg (240 lb) 107.548 kg (237 lb 1.6 oz) 106.323 kg (234 lb 6.4 oz)   telemetry: Normal sinus rhythm  Exam:   General:  A+Ox3, NAD in chair  Cardiovascular: rrr Without murmurs gallops rubs  Respiratory: rales at bases  Abdomen: +Bs, soft nontender nondistended  Extremities: 1+ ankle edema  Psychiatric: affect normal today  Data Reviewed: Basic Metabolic Panel:  Recent Labs Lab 12/25/13 0845 12/25/13 1940 12/26/13 0511 12/27/13 0423 12/28/13 0707  NA 126* 128* 127* 132* 133*  K 5.1 4.8 4.3 3.9 3.6*  CL 93* 94* 95* 100 101  CO2 13* 14* 16* 18* 18*  GLUCOSE 290* 243* 80 123* 75  BUN 50* 59* 62* 60* 49*  CREATININE 4.99* 5.32* 5.24* 4.54* 3.62*  CALCIUM 7.9* 8.2* 8.1* 7.9* 7.8*  PHOS  --   --   --  4.3 3.8   Liver Function Tests:  Recent Labs Lab 12/24/13 0603 12/27/13 0423 12/28/13 0707  AST 13  --   --   ALT 8  --   --   ALKPHOS 78  --   --   BILITOT <0.2*  --   --   PROT 5.7*  --   --   ALBUMIN 1.4* 1.3* 1.1*   No results for input(s): LIPASE, AMYLASE in the last 168 hours. No results for input(s): AMMONIA in the last 168 hours. CBC:  Recent Labs Lab 12/23/13 1401 12/23/13 1850 12/24/13 0603 12/25/13 0845 12/26/13 0511  12/28/13 0707  WBC 13.3* 14.8* 14.8* 10.6* 11.5* 7.9  NEUTROABS 11.6*  --   --   --   --   --   HGB 12.7* 12.2* 10.8* 10.7* 10.8* 11.6*  HCT 38.6* 36.6* 32.4* 32.7* 32.4* 34.7*  MCV 95.3 92.0 91.5 95.1 93.1 91.8  PLT 252 278 247 215 242 359   Cardiac Enzymes:  Recent Labs Lab 12/23/13 1401 12/24/13 12/24/13 0603  TROPONINI <0.30 1.63* 1.01*   BNP (last 3 results)  Recent Labs  12/23/13 1338  PROBNP 6963.0*   CBG:  Recent Labs Lab 12/28/13 0616 12/28/13 0724 12/28/13 0747 12/28/13 0848 12/28/13 1204  GLUCAP 70 68* 62* 114* 71    Recent Results (from the past 240 hour(s))  MRSA PCR Screening     Status: None   Collection Time: 12/23/13  2:12 PM  Result Value Ref Range Status   MRSA by PCR NEGATIVE NEGATIVE Final    Comment:        The GeneXpert MRSA Assay (FDA approved for NASAL specimens only), is one component of a comprehensive MRSA colonization surveillance program. It is not intended to diagnose MRSA infection nor to guide or monitor treatment for MRSA infections.   Culture, blood (routine x 2) Call MD if unable to obtain prior to antibiotics being given     Status: None (Preliminary result)   Collection Time: 12/23/13  6:50 PM  Result Value Ref Range Status   Specimen Description BLOOD LEFT WRIST  Final   Special Requests BOTTLES DRAWN AEROBIC AND ANAEROBIC 10CC  Final   Culture  Setup Time   Final    12/24/2013 01:30 Performed at [facility]    Culture   Final           BLOOD CULTURE RECEIVED NO GROWTH TO DATE CULTURE WILL BE HELD FOR 5 DAYS BEFORE ISSUING A FINAL NEGATIVE REPORT Performed at [facility]    Report Status PENDING  Incomplete  Culture, blood (routine x 2) Call MD if unable to obtain prior to antibiotics being given     Status: None (Preliminary result)   Collection Time: 12/23/13  7:00 PM  Result Value Ref Range Status   Specimen Description BLOOD LEFT HAND  Final   Special Requests BOTTLES DRAWN AEROBIC AND  ANAEROBIC 10CC  Final   Culture  Setup Time   Final    12/24/2013 01:30 Performed at [facility]    Culture   Final           BLOOD CULTURE RECEIVED NO GROWTH TO DATE CULTURE WILL BE HELD FOR 5 DAYS BEFORE ISSUING A FINAL NEGATIVE REPORT Performed at [facility]    Report Status PENDING  Incomplete  Culture, sputum-assessment     Status: None   Collection Time: 12/24/13 10:21 PM  Result Value Ref Range Status   Specimen Description SPUTUM  Final   Special Requests NONE  Final   Sputum evaluation   Final    MICROSCOPIC FINDINGS SUGGEST THAT THIS SPECIMEN IS NOT REPRESENTATIVE OF LOWER RESPIRATORY SECRETIONS. PLEASE RECOLLECT. CALLED TO RN J.TEMBRINA AT 0150 BY L.PITT 12/25/13    Report Status 12/25/2013 FINAL  Final     Studies: No results found.  Scheduled Meds: . acetaminophen  650 mg Oral TID  . azithromycin  500 mg Oral Daily  . benzonatate  200 mg Oral TID  . carvedilol  6.25 mg Oral BID WC  . cefUROXime  500 mg Oral BID WC  . cloNIDine  0.2 mg Oral QHS  . furosemide  40 mg Intravenous Once  . heparin  5,000 Units Subcutaneous 3 times per day  . insulin detemir  15 Units Subcutaneous QHS  . potassium chloride  20 mEq Oral Once  . sodium chloride  3 mL Intravenous Q12H   Continuous Infusions:   Antibiotics Given (last 72 hours)    Date/Time Action Medication Dose Rate   12/25/13 1629 Given   cefTRIAXone (ROCEPHIN) 1 g in dextrose 5 % 50 mL IVPB 1 g 100 mL/hr   12/25/13 1745 Given   azithromycin (ZITHROMAX) 500 mg in dextrose 5 % 250 mL IVPB 500 mg 250 mL/hr   12/26/13 1601 Given   azithromycin (ZITHROMAX) 500 mg in dextrose 5 % 250 mL IVPB 500 mg 250 mL/hr   12/26/13 1743 Given   cefTRIAXone (ROCEPHIN) 1 g in dextrose 5 % 50 mL IVPB 1 g 100 mL/hr   12/27/13 1732 Given   azithromycin (ZITHROMAX) tablet 500 mg 500 mg    12/27/13 1732 Given   cefUROXime (CEFTIN) tablet 500 mg 500 mg    12/28/13 0957 Given   azithromycin (ZITHROMAX) tablet 500  mg 500 mg    12/28/13 0957 Given   cefUROXime (CEFTIN) tablet 500 mg 500 mg       Time spent: 35 min  [facility] Pager [phone]. If 7PM-7AM, please contact night-coverage at [email or web address], password [facility] 12/28/2013, 1:43 PM  LOS: 5 days

## 2013-12-29 ENCOUNTER — Other Ambulatory Visit: Payer: Self-pay | Admitting: Physician Assistant

## 2013-12-29 DIAGNOSIS — N049 Nephrotic syndrome with unspecified morphologic changes: Secondary | ICD-10-CM

## 2013-12-29 DIAGNOSIS — I248 Other forms of acute ischemic heart disease: Secondary | ICD-10-CM

## 2013-12-29 LAB — BASIC METABOLIC PANEL
ANION GAP: 11 (ref 5–15)
BUN: 45 mg/dL — ABNORMAL HIGH (ref 6–23)
CO2: 19 meq/L (ref 19–32)
Calcium: 7.9 mg/dL — ABNORMAL LOW (ref 8.4–10.5)
Chloride: 102 mEq/L (ref 96–112)
Creatinine, Ser: 3.31 mg/dL — ABNORMAL HIGH (ref 0.50–1.35)
GFR calc non Af Amer: 21 mL/min — ABNORMAL LOW (ref >90)
GFR, EST AFRICAN AMERICAN: 24 mL/min — AB (ref >90)
Glucose, Bld: 347 mg/dL — ABNORMAL HIGH (ref 70–99)
POTASSIUM: 4.3 meq/L (ref 3.7–5.3)
Sodium: 132 mEq/L — ABNORMAL LOW (ref 137–147)

## 2013-12-29 LAB — GLUCOSE, CAPILLARY
GLUCOSE-CAPILLARY: 316 mg/dL — AB (ref 70–99)
GLUCOSE-CAPILLARY: 230 mg/dL — AB (ref 70–99)

## 2013-12-29 MED ORDER — TORSEMIDE 20 MG PO TABS
20.0000 mg | ORAL_TABLET | Freq: Every day | ORAL | Status: DC
  Administered 2013-12-29: 20 mg via ORAL
  Filled 2013-12-29: qty 1

## 2013-12-29 MED ORDER — INSULIN ASPART 100 UNIT/ML ~~LOC~~ SOLN: 10.0000 [IU] | Freq: Three times a day (TID) | SUBCUTANEOUS | Status: DC

## 2013-12-29 MED ORDER — TORSEMIDE 20 MG PO TABS: 60.0000 mg | ORAL_TABLET | Freq: Every day | ORAL | Status: DC

## 2013-12-29 MED ORDER — LOSARTAN POTASSIUM 100 MG PO TABS: 100.0000 mg | ORAL_TABLET | Freq: Every day | ORAL | Status: DC

## 2013-12-29 MED ORDER — BENZONATATE 100 MG PO CAPS: 100.0000 mg | ORAL_CAPSULE | Freq: Three times a day (TID) | ORAL | Status: DC | PRN

## 2013-12-29 MED ORDER — CEFUROXIME AXETIL 500 MG PO TABS: 500.0000 mg | ORAL_TABLET | Freq: Two times a day (BID) | ORAL | Status: DC

## 2013-12-29 MED ORDER — INSULIN ASPART 100 UNIT/ML ~~LOC~~ SOLN
5.0000 [IU] | Freq: Three times a day (TID) | SUBCUTANEOUS | Status: DC
  Administered 2013-12-29 (×2): 5 [IU] via SUBCUTANEOUS

## 2013-12-29 MED ORDER — INSULIN DETEMIR 100 UNIT/ML ~~LOC~~ SOLN: 20.0000 [IU] | Freq: Every day | SUBCUTANEOUS | Status: DC

## 2013-12-29 NOTE — Plan of Care (Signed)
Problem: Discharge Progression Outcomes Goal: Tolerating diet Outcome: Completed/Met Date Met:  12/29/13     

## 2013-12-29 NOTE — Consult Note (Signed)
Reason for Consult: Acute kidney injury on chronic kidney disease stage III Referring Physician: Doree Barthel MD [facility])  HPI:  43 year old African-American man with past medical history significant for insulin-dependent diabetes for the past 26 years complicated by retinopathy and nephropathy with consequent chronic kidney disease stage III (baseline creatinine appears to be around 2.2). He also has underlying hypertension for the past 15 or so years, grade 2 diastolic heart failure and obesity.  He was admitted to the hospital on 12/23/13 with shortness of breath and cough of about 2 days duration and was found to have leukocytosis, tachycardia and a chest x-ray that showed right upper lobe pneumonia. He also had evidence of diabetic ketoacidosis on labs. He was started on antibiotic therapy and insulin/fluids. About 2 days into his hospitalization, he developed acute renal failure there appears to be from ATN given his workup. Renal function is gradually improving and he has been recognized to have nephrotic range proteinuria. Prior to admission, it is noted that he was taking an ARB, thiazide and diuretic. He denies any regular use of nonsteroidal anti-inflammatory drugs. He used to follow-up with Dr. [name] in [place] for nephrology care however, wants to change providers. He denies any recurrent nausea, vomiting or diarrhea. Denies any hematuria and has intermittently foamy urine. He denies any familial history of chronic kidney disease/ESRD. He denies any personal history of autoimmune disorders.   12/31/2011  12/22/2012  03/26/2013  12/23/2013  12/24/2013  12/25/2013  12/26/2013  12/27/2013  12/28/2013  12/29/2013   BUN 37 (H) 38 (H) 46 (H) 42 (H) 41 (H) 59 (H) 62 (H) 60 (H) 49 (H) 45 (H)  Creatinine 1.92 (H) 2.16 (H) 2.21 (H) 3.62 (H) 3.95 (H) 5.32 (H) 5.24 (H) 4.54 (H) 3.62 (H) 3.31 (H)    Past Medical History  Diagnosis Date  . Type 1 diabetes mellitus 1990  . Essential hypertension,  benign 2000    LVH  . Hyperlipidemia 2000  . Aortic stenosis     Very mild in 05/2007  . PSVT (paroxysmal supraventricular tachycardia)     Nonsustained; asymptomatic; diagnosed by event recorder in 2006  . Degenerative joint disease     Left TKA  . Tobacco abuse, in remission     20 pack years; discontinued in 1985; bronchitic changes on chest x-ray and 2009  . Diastolic heart failure     LVEF 65-70% with grade 2 diastolic dysfunction  . CKD (chronic kidney disease) stage 3, GFR 30-59 ml/min     Past Surgical History  Procedure Laterality Date  . Total knee arthroplasty      Left  . Lumbar spine surgery      Multiple procedures    Family History  Problem Relation Age of Onset  . Diabetes Father   . Diabetes Mother     Social History:  reports that he has never smoked. He does not have any smokeless tobacco history on file. He reports that he does not drink alcohol or use illicit drugs.  Allergies:  Allergies  Allergen Reactions  . Atorvastatin Other (See Comments)    Myalgias  . Minoxidil Other (See Comments)    Fluid retention    Medications:  Scheduled: . acetaminophen  650 mg Oral TID  . azithromycin  500 mg Oral Daily  . benzonatate  200 mg Oral TID  . carvedilol  6.25 mg Oral BID WC  . cefUROXime  500 mg Oral BID WC  . cloNIDine  0.2 mg Oral QHS  .  heparin  5,000 Units Subcutaneous 3 times per day  . insulin aspart  5 Units Subcutaneous TID WC  . insulin detemir  15 Units Subcutaneous QHS  . sodium chloride  3 mL Intravenous Q12H  . torsemide  20 mg Oral Daily    Results for orders placed or performed during the hospital encounter of 12/23/13 (from the past 48 hour(s))  Glucose, capillary     Status: Abnormal   Collection Time: 12/27/13  2:50 PM  Result Value Ref Range   Glucose-Capillary 59 (L) 70 - 99 mg/dL  Glucose, capillary     Status: Abnormal   Collection Time: 12/27/13  3:04 PM  Result Value Ref Range   Glucose-Capillary 67 (L) 70 - 99  mg/dL  Glucose, capillary     Status: None   Collection Time: 12/27/13  3:25 PM  Result Value Ref Range   Glucose-Capillary 82 70 - 99 mg/dL  Glucose, capillary     Status: None   Collection Time: 12/27/13  4:17 PM  Result Value Ref Range   Glucose-Capillary 90 70 - 99 mg/dL   Comment 1 Notify RN    Comment 2 Documented in Chart   Protein, urine, 24 hour     Status: Abnormal   Collection Time: 12/27/13  8:10 PM  Result Value Ref Range   Urine Total Volume-UPROT 2500 mL   Collection Interval-UPROT 24 hours   Protein, Urine 527 mg/dL   Protein, 24H Urine 13175 (H) 50 - 100 mg/day  Creatinine, urine, 24 hour     Status: None   Collection Time: 12/27/13  8:10 PM  Result Value Ref Range   Urine Total Volume-UCRE24 2500 mL   Collection Interval-UCRE24 24 hours   Creatinine, Urine 79.86 mg/dL   Creatinine, 24H Ur 1997 800 - 2000 mg/day  Glucose, capillary     Status: Abnormal   Collection Time: 12/27/13  9:17 PM  Result Value Ref Range   Glucose-Capillary 101 (H) 70 - 99 mg/dL  Glucose, capillary     Status: None   Collection Time: 12/28/13  6:16 AM  Result Value Ref Range   Glucose-Capillary 70 70 - 99 mg/dL  Renal function panel     Status: Abnormal   Collection Time: 12/28/13  7:07 AM  Result Value Ref Range   Sodium 133 (L) 137 - 147 mEq/L   Potassium 3.6 (L) 3.7 - 5.3 mEq/L   Chloride 101 96 - 112 mEq/L   CO2 18 (L) 19 - 32 mEq/L   Glucose, Bld 75 70 - 99 mg/dL   BUN 49 (H) 6 - 23 mg/dL   Creatinine, Ser 3.62 (H) 0.50 - 1.35 mg/dL   Calcium 7.8 (L) 8.4 - 10.5 mg/dL   Phosphorus 3.8 2.3 - 4.6 mg/dL   Albumin 1.1 (L) 3.5 - 5.2 g/dL   GFR calc non Af Amer 19 (L) >90 mL/min   GFR calc Af Amer 22 (L) >90 mL/min    Comment: (NOTE) The eGFR has been calculated using the CKD EPI equation. This calculation has not been validated in all clinical situations. eGFR's persistently <90 mL/min signify possible Chronic Kidney Disease.    Anion gap 14 5 - 15  CBC     Status:  Abnormal   Collection Time: 12/28/13  7:07 AM  Result Value Ref Range   WBC 7.9 4.0 - 10.5 K/uL   RBC 3.78 (L) 4.22 - 5.81 MIL/uL   Hemoglobin 11.6 (L) 13.0 - 17.0 g/dL   HCT 34.7 (L)  39.0 - 52.0 %   MCV 91.8 78.0 - 100.0 fL   MCH 30.7 26.0 - 34.0 pg   MCHC 33.4 30.0 - 36.0 g/dL   RDW 13.2 11.5 - 15.5 %   Platelets 359 150 - 400 K/uL  Lipid panel     Status: Abnormal   Collection Time: 12/28/13  7:07 AM  Result Value Ref Range   Cholesterol 174 0 - 200 mg/dL   Triglycerides 229 (H) <150 mg/dL   HDL 39 (L) >39 mg/dL   Total CHOL/HDL Ratio 4.5 RATIO   VLDL 46 (H) 0 - 40 mg/dL   LDL Cholesterol 89 0 - 99 mg/dL    Comment:        Total Cholesterol/HDL:CHD Risk Coronary Heart Disease Risk Table                     Men   Women  1/2 Average Risk   3.4   3.3  Average Risk       5.0   4.4  2 X Average Risk   9.6   7.1  3 X Average Risk  23.4   11.0        Use the calculated Patient Ratio above and the CHD Risk Table to determine the patient's CHD Risk.        ATP III CLASSIFICATION (LDL):  <100     mg/dL   Optimal  100-129  mg/dL   Near or Above                    Optimal  130-159  mg/dL   Borderline  160-189  mg/dL   High  >190     mg/dL   Very High   Glucose, capillary     Status: Abnormal   Collection Time: 12/28/13  7:24 AM  Result Value Ref Range   Glucose-Capillary 68 (L) 70 - 99 mg/dL  Glucose, capillary     Status: Abnormal   Collection Time: 12/28/13  7:47 AM  Result Value Ref Range   Glucose-Capillary 62 (L) 70 - 99 mg/dL  Glucose, capillary     Status: Abnormal   Collection Time: 12/28/13  8:48 AM  Result Value Ref Range   Glucose-Capillary 114 (H) 70 - 99 mg/dL  Glucose, capillary     Status: None   Collection Time: 12/28/13 12:04 PM  Result Value Ref Range   Glucose-Capillary 71 70 - 99 mg/dL  Glucose, capillary     Status: Abnormal   Collection Time: 12/28/13  4:52 PM  Result Value Ref Range   Glucose-Capillary 172 (H) 70 - 99 mg/dL  Glucose,  capillary     Status: Abnormal   Collection Time: 12/28/13  9:25 PM  Result Value Ref Range   Glucose-Capillary 221 (H) 70 - 99 mg/dL  Basic metabolic panel     Status: Abnormal   Collection Time: 12/29/13  6:00 AM  Result Value Ref Range   Sodium 132 (L) 137 - 147 mEq/L   Potassium 4.3 3.7 - 5.3 mEq/L   Chloride 102 96 - 112 mEq/L   CO2 19 19 - 32 mEq/L   Glucose, Bld 347 (H) 70 - 99 mg/dL   BUN 45 (H) 6 - 23 mg/dL   Creatinine, Ser 3.31 (H) 0.50 - 1.35 mg/dL   Calcium 7.9 (L) 8.4 - 10.5 mg/dL   GFR calc non Af Amer 21 (L) >90 mL/min   GFR calc Af Amer 24 (L) >90 mL/min  Comment: (NOTE) The eGFR has been calculated using the CKD EPI equation. This calculation has not been validated in all clinical situations. eGFR's persistently <90 mL/min signify possible Chronic Kidney Disease.    Anion gap 11 5 - 15  Glucose, capillary     Status: Abnormal   Collection Time: 12/29/13  7:53 AM  Result Value Ref Range   Glucose-Capillary 316 (H) 70 - 99 mg/dL  Glucose, capillary     Status: Abnormal   Collection Time: 12/29/13 12:05 PM  Result Value Ref Range   Glucose-Capillary 230 (H) 70 - 99 mg/dL    Dg Chest 2 View  12/28/2013   CLINICAL DATA:  Dyspnea.  EXAM: CHEST  2 VIEW  COMPARISON:  December 23, 2013.  FINDINGS: The heart size and mediastinal contours are within normal limits. No pneumothorax is noted. Minimal left pleural effusion is noted. Right lung is clear. Increased left upper lobe airspace opacity with air bronchograms is noted consistent with worsening pneumonia. The visualized skeletal structures are unremarkable.  IMPRESSION: Worsening left upper lobe pneumonia. Followup radiographs are recommended to ensure resolution.   Electronically Signed   By: Sabino Dick M.D.   On: 12/28/2013 15:53    Review of Systems  Constitutional: Positive for malaise/fatigue. Negative for fever and chills.  HENT: Negative.   Eyes: Negative.   Respiratory: Positive for cough and sputum  production. Negative for hemoptysis and shortness of breath.   Cardiovascular: Negative.   Gastrointestinal: Negative.   Genitourinary: Negative.   Musculoskeletal: Negative.   Skin: Negative.   Neurological: Negative.   Endo/Heme/Allergies: Negative.   Psychiatric/Behavioral: Negative.    Blood pressure 155/80, pulse 85, temperature 98.6 F (37 C), temperature source Oral, resp. rate 17, height _0  (1.905 m), weight 106.323 kg (234 lb 6.4 oz), SpO2 100 %. Physical Exam  Nursing note and vitals reviewed. Constitutional: He is oriented to person, place, and time. He appears well-developed and well-nourished. No distress.  HENT:  Head: Normocephalic and atraumatic.  Nose: Nose normal.  Mouth/Throat: No oropharyngeal exudate.  Eyes: EOM are normal. Pupils are equal, round, and reactive to light. No scleral icterus.  Neck: Normal range of motion. Neck supple. JVD present.  7cm JVD  Cardiovascular: Normal rate, regular rhythm and normal heart sounds.   No murmur heard. Respiratory: Effort normal. No respiratory distress. He has no wheezes. He has rales.  Left base rales  GI: Soft. Bowel sounds are normal. He exhibits distension. There is no tenderness. There is no rebound and no guarding.  Musculoskeletal: Normal range of motion. He exhibits edema.  2-3+ LE edema  Lymphadenopathy:    He has no cervical adenopathy.  Neurological: He is alert and oriented to person, place, and time.  Skin: Skin is warm and dry. No erythema.  Psychiatric: He has a normal mood and affect. His behavior is normal.    Assessment/Plan: 1. Acute renal failure on chronic kidney disease stage III: data appears to be consistent with Evolution of prerenal azotemia into ATN (as seen by granular casts in his urinalysis). Fortunately, appears to be having renal recovery with consistent improvement of creatinine over the past 72 hours. He has nephrotic range proteinuria which most likely is from his underlying  diabetes and renal biopsy will not be undertaken at this time. He will need to restart his ARB (without the thiazide component) in the next 2-3 days as renal function continues to improve (would provide him with a prescription at discharge- #90 and instruct him to  start losartan 100 mg daily starting 11/20). Would recommend to discharge him on torsemide 40 mg daily as he remains clearly volume overloaded. Reeducated him regarding abstinence from nonsteroidal anti-inflammatory drugs and to follow a low-sodium diet. Renal ultrasound was negative for hydronephrosis and shows features consistent with chronic kidney disease. 2. Community-acquired pneumonia: On appropriate antibiotic therapy with improving markers of infection/symptomatically met. 3. Diabetic ketoacidosis: appears to have been successfully treated with intravenous fluids/insulin drip and treatment of the underlying infection. He has now been resumed on his usual doses of insulin. 4. Nephrotic range proteinuria: This is most probably due to his underlying diabetes mellitus given its chronicity and association with diabetic retinopathy. Renal biopsy will not be undertaken at this point unless any red flags identified. 5. Hyponatremia: hypervolemic hyponatremia likely secondary to congestive heart failure exacerbation (unfortunately a poor prognostic marker for readmission) as well as from impaired free water excretion with acute renal failure. 6. Hypertension:elevated blood pressures noted, increase torsemide as recommended in problem #1 and restart ARB-losartan 100 mg daily in 2 days.   Rylan Bernard K. 12/29/2013, 2:04 PM

## 2013-12-29 NOTE — Discharge Summary (Signed)
Physician Discharge Summary  Lance [name] [id number] DOB: [birth year] DOA: 12/23/2013  PCP: [name], MD  Admit date: 12/23/2013 Discharge date: 12/29/2013  Time spent: greater than 30 minutes  Recommendations for Outpatient Follow-up:  1. Monitor BMET 2. Optimize diabetes control  Discharge Diagnoses:  Principal Problem:   DKA (diabetic ketoacidoses) Active Problems:   Essential hypertension, benign   CAP (community acquired pneumonia)   Sepsis due to pneumonia   Metabolic acidemia   Demand ischemia   Acute renal failure syndrome   Diabetic ketoacidosis without coma associated with type 1 diabetes mellitus   Hyponatremia   Acute diastolic heart failure   Hypoglycemia   Dyspnea   Nephrotic syndrome   Discharge Condition: stable  Filed Weights   12/24/13 2050 12/25/13 2147 12/26/13 2119  Weight: 108.863 kg (240 lb) 107.548 kg (237 lb 1.6 oz) 106.323 kg (234 lb 6.4 oz)    History of present illness:  43 y.o. male  With a hx of type 1 DM, HLD, HTN, grade 2 diastolic CHF who presents to the ED with sob and cough that began 1-2 days prior to admit. Pt denies subjective fevers. In the ED, the patient was noted to have leukocytosis of 13k with tachycardia and CXR revealing RUL pneumonia. The patient was also noted to have glucose in the [id number] with metabolic acidosis and GAP of 24. The patient was started on abx, insulin gtt, and hospitalist service consulted for admission  Hospital Course:  DKA in type 1 diabetic Patient reported having stopped insulin PTA due to severe hypoglycemic episode.  Fluid rescussitated, started on insulin gtt. After gap closed, started on subcutaneous insulin.  Developed lows, likely related to worsening renal functioning. By discharge CBGs much better controlled with fewer lows. Encouraged patient to follow up with his endocrinologist, Dr. Naomie [name] Hgb A1C 9.7.   ARF on CKD likely stage III (baseline 2.2) Likely secondary to DKA,  sepsis with pneumonia, hypotension,prerenal azotemia and ARB, thiazide, and loop diuretics that he had been taking prior to admission.  Peaked above 5. Improved with IVF, but started developing edema, so lasix resumed Renal ultrasound shows no hydronephrosis, and does show medical renal disease. FENa <1%. Has >300 protein in urine and low albumin. 24 hour urine showed nephrotic range proteinuria.  Presumably secondary to diabetes. Patient reports that he has seen Dr. Theador [name] once about a year ago and never returned. Developing swelling and orthopnea.  Nephrology consulted and will follow up soon.  HTN Very high on admission, became hypotensive when placed on home meds, dropped BP- suspect patient was not taking all his meds at home. Normotensive now. Discussed with cardiology. Will stop verapamil, clonidine, uptitrate coreg. No ACE or ARB for 1 week, then resume per nephrology recommendations  Sepsis/ CAP Started on rocephin azithromycin. Blood cultures remain negative. Urine legionella and strep pneumonia antigens negative. Ambulating ok. Less cough and dyspnea  Elevated troponin No chest pain or significant EKG changes or echocardiogram changes. Cardiology consulted. Not a candidate for cardiac catheterization due to chronic kidney disease. Out patient treadmill nuclear stress test arranged.  Anemia Probably CKD   Code Status: full Family Communication:  Disposition Plan:    Consultants:  Cardiology  nephrology  Procedures:  none   Discharge Exam: Filed Vitals:   12/29/13 0929  BP: 155/80  Pulse: 85  Temp: 98.6 F (37 C)  Resp: 17    General: comfortable Cardiovascular: RRR Respiratory: CTA abd s, nt, nd Ext trace edema  Discharge Instructions  Diet - low sodium heart healthy    Complete by:  As directed      Diet Carb Modified    Complete by:  As directed      Discharge instructions    Complete by:  As directed   Monitor blood glucose 4 times  daily. Call Dr. Carlis [name] with insulin questions. Stop verapamil, hyzaar, hydralazine, clonidine. Start losartan on Monday 11/23. Change torsemide to 60 mg once daily only     Increase activity slowly    Complete by:  As directed           Current Discharge Medication List    START taking these medications   Details  benzonatate (TESSALON) 100 MG capsule Take 1 capsule (100 mg total) by mouth 3 (three) times daily as needed for cough. Qty: 20 capsule, Refills: 0    cefUROXime (CEFTIN) 500 MG tablet Take 1 tablet (500 mg total) by mouth 2 (two) times daily with a meal. Qty: 3 tablet, Refills: 0    losartan (COZAAR) 100 MG tablet Take 1 tablet (100 mg total) by mouth daily. Qty: 30 tablet, Refills: 0      CONTINUE these medications which have CHANGED   Details  insulin aspart (NOVOLOG) 100 UNIT/ML injection Inject 10 Units into the skin 3 (three) times daily before meals. Qty: 10 mL, Refills: 11    insulin detemir (LEVEMIR) 100 UNIT/ML injection Inject 0.2 mLs (20 Units total) into the skin at bedtime. Qty: 10 mL, Refills: 11    torsemide (DEMADEX) 20 MG tablet Take 3 tablets (60 mg total) by mouth daily. Qty: 150 tablet, Refills: 6      CONTINUE these medications which have NOT CHANGED   Details  carvedilol (COREG) 25 MG tablet TAKE 2 TABLETS (50 MG TOTAL) BY MOUTH 2 (TWO) TIMES DAILY. Qty: 120 tablet, Refills: 7    potassium chloride SA (K-DUR,KLOR-CON) 20 MEQ tablet Take 1 tablet (20 mEq total) by mouth daily. Qty: 30 tablet, Refills: 6      STOP taking these medications     amLODipine (NORVASC) 10 MG tablet      cloNIDine (CATAPRES) 0.2 MG tablet      furosemide (LASIX) 80 MG tablet      hydrALAZINE (APRESOLINE) 50 MG tablet      losartan-hydrochlorothiazide (HYZAAR) 100-25 MG per tablet      metolazone (ZAROXOLYN) 5 MG tablet      verapamil (CALAN-SR) 240 MG CR tablet      hydrALAZINE (APRESOLINE) 50 MG tablet        Allergies  Allergen Reactions  .  Atorvastatin Other (See Comments)    Myalgias  . Minoxidil Other (See Comments)    Fluid retention   Follow-up Information    Follow up with [facility] On 02/08/2014.   Why:  Check in at the radiology department for your stress test at 9:15am    Contact information:   [address] [place] [phone]       Follow up with Jory Sims, NP On 01/10/2014.   Specialty:  Nurse Practitioner   Why:  @ 3:30pm    Contact information:   [place] [address] [phone]       Follow up with Ulla Potash., MD.   Specialty:  Nephrology   Why:  his office will call you with appointment   Contact information:   [place] [address] [phone]        The results of  significant diagnostics from this hospitalization (including imaging, microbiology, ancillary and laboratory) are listed below for reference.    Significant Diagnostic Studies: Dg Chest 2 View  12/28/2013   CLINICAL DATA:  Dyspnea.  EXAM: CHEST  2 VIEW  COMPARISON:  December 23, 2013.  FINDINGS: The heart size and mediastinal contours are within normal limits. No pneumothorax is noted. Minimal left pleural effusion is noted. Right lung is clear. Increased left upper lobe airspace opacity with air bronchograms is noted consistent with worsening pneumonia. The visualized skeletal structures are unremarkable.  IMPRESSION: Worsening left upper lobe pneumonia. Followup radiographs are recommended to ensure resolution.   Electronically Signed   By: Sabino Dick M.D.   On: 12/28/2013 15:53   US Renal  12/25/2013   CLINICAL DATA:  Acute renal insufficiency, sepsis, pneumonia  EXAM: RENAL/URINARY TRACT ULTRASOUND COMPLETE  COMPARISON:  None.  FINDINGS: Right Kidney:  Length: 12.6 cm.  No hydronephrosis.  No renal calculi.  Left Kidney:  Length: 12.4 cm. No hydronephrosis. No renal calculi. Bilateral renal mild increased echogenicity suspicious for medical renal disease.   Bladder:  Appears normal for degree of bladder distention.  Bilateral pleural effusions.  IMPRESSION: 1. No hydronephrosis or renal calculi. Bilateral renal mild increased cortical echogenicity suspicious for medical renal disease. Unremarkable urinary bladder. Bilateral pleural effusion.   Electronically Signed   By: [name] M.D.   On: 12/25/2013 15:11   Dg Chest Portable 1 View  12/23/2013   ADDENDUM REPORT: 12/23/2013 14:17  ADDENDUM: Impression should read left upper lobe pneumonia and not right   Electronically Signed   By: [name] M.D.   On: 12/23/2013 14:17   12/23/2013   CLINICAL DATA:  Cough and shortness of Breath  EXAM: PORTABLE CHEST - 1 VIEW  COMPARISON:  None.  FINDINGS: Cardiac shadow is mildly enlarged. Rounded area of infiltrate is noted in the left upper lobe consistent with acute pneumonia. Right lung is clear. No bony abnormality is noted.  IMPRESSION: Right upper lobe pneumonia. Followup films following appropriate therapy are recommended.  Electronically Signed: By: [name] M.D. On: 12/23/2013 14:02   ekg Sinus tachycardia Probable left atrial enlargement Left ventricular hypertrophy Probable anterior infarct, old Borderline T abnormalities, inferior leads  Echo Left ventricle: The cavity size was normal. Wall thickness was increased in a pattern of mild LVH. Systolic function was normal. The estimated ejection fraction was in the range of 60% to 65%. Wall motion was normal; there were no regional wall motion abnormalities. - Mitral valve: There was mild regurgitation. - Left atrium: The atrium was mildly dilated.  Microbiology: Recent Results (from the past 240 hour(s))  MRSA PCR Screening     Status: None   Collection Time: 12/23/13  2:12 PM  Result Value Ref Range Status   MRSA by PCR NEGATIVE NEGATIVE Final    Comment:        The GeneXpert MRSA Assay (FDA approved for NASAL specimens only), is one component of a comprehensive MRSA  colonization surveillance program. It is not intended to diagnose MRSA infection nor to guide or monitor treatment for MRSA infections.   Culture, blood (routine x 2) Call MD if unable to obtain prior to antibiotics being given     Status: None (Preliminary result)   Collection Time: 12/23/13  6:50 PM  Result Value Ref Range Status   Specimen Description BLOOD LEFT WRIST  Final   Special Requests BOTTLES DRAWN AEROBIC AND ANAEROBIC 10CC  Final   Culture  Setup Time   Final    12/24/2013 01:30 Performed at [facility]    Culture   Final           BLOOD CULTURE RECEIVED NO GROWTH TO DATE CULTURE WILL BE HELD FOR 5 DAYS BEFORE ISSUING A FINAL NEGATIVE REPORT Performed at [facility]    Report Status PENDING  Incomplete  Culture, blood (routine x 2) Call MD if unable to obtain prior to antibiotics being given     Status: None (Preliminary result)   Collection Time: 12/23/13  7:00 PM  Result Value Ref Range Status   Specimen Description BLOOD LEFT HAND  Final   Special Requests BOTTLES DRAWN AEROBIC AND ANAEROBIC 10CC  Final   Culture  Setup Time   Final    12/24/2013 01:30 Performed at [facility]    Culture   Final           BLOOD CULTURE RECEIVED NO GROWTH TO DATE CULTURE WILL BE HELD FOR 5 DAYS BEFORE ISSUING A FINAL NEGATIVE REPORT Performed at [facility]    Report Status PENDING  Incomplete  Culture, sputum-assessment     Status: None   Collection Time: 12/24/13 10:21 PM  Result Value Ref Range Status   Specimen Description SPUTUM  Final   Special Requests NONE  Final   Sputum evaluation   Final    MICROSCOPIC FINDINGS SUGGEST THAT THIS SPECIMEN IS NOT REPRESENTATIVE OF LOWER RESPIRATORY SECRETIONS. PLEASE RECOLLECT. CALLED TO RN J.TEMBRINA AT 0150 BY L.PITT 12/25/13    Report Status 12/25/2013 FINAL  Final     Labs: Basic Metabolic Panel:  Recent Labs Lab 12/25/13 1940 12/26/13 0511 12/27/13 0423 12/28/13 0707  12/29/13 0600  NA 128* 127* 132* 133* 132*  K 4.8 4.3 3.9 3.6* 4.3  CL 94* 95* 100 101 102  CO2 14* 16* 18* 18* 19  GLUCOSE 243* 80 123* 75 347*  BUN 59* 62* 60* 49* 45*  CREATININE 5.32* 5.24* 4.54* 3.62* 3.31*  CALCIUM 8.2* 8.1* 7.9* 7.8* 7.9*  PHOS  --   --  4.3 3.8  --    Liver Function Tests:  Recent Labs Lab 12/24/13 0603 12/27/13 0423 12/28/13 0707  AST 13  --   --   ALT 8  --   --   ALKPHOS 78  --   --   BILITOT <0.2*  --   --   PROT 5.7*  --   --   ALBUMIN 1.4* 1.3* 1.1*   No results for input(s): LIPASE, AMYLASE in the last 168 hours. No results for input(s): AMMONIA in the last 168 hours. CBC:  Recent Labs Lab 12/23/13 1401 12/23/13 1850 12/24/13 0603 12/25/13 0845 12/26/13 0511 12/28/13 0707  WBC 13.3* 14.8* 14.8* 10.6* 11.5* 7.9  NEUTROABS 11.6*  --   --   --   --   --   HGB 12.7* 12.2* 10.8* 10.7* 10.8* 11.6*  HCT 38.6* 36.6* 32.4* 32.7* 32.4* 34.7*  MCV 95.3 92.0 91.5 95.1 93.1 91.8  PLT 252 278 247 215 242 359   Cardiac Enzymes:  Recent Labs Lab 12/23/13 1401 12/24/13 12/24/13 0603  TROPONINI <0.30 1.63* 1.01*   BNP: BNP (last 3 results)  Recent Labs  12/23/13 1338  PROBNP 6963.0*   CBG:  Recent Labs Lab 12/28/13 1204 12/28/13 1652 12/28/13 2125 12/29/13 0753 12/29/13 1205  GLUCAP 71 172* 221* 316* 230*      Signed:  [place]  [facility] 12/29/2013, 1:20  PM

## 2013-12-29 NOTE — Progress Notes (Signed)
Patient Name: Lance [name] Date of Encounter: 12/29/2013  Principal Problem:   DKA (diabetic ketoacidoses) Active Problems:   Essential hypertension, benign   CAP (community acquired pneumonia)   Sepsis due to pneumonia   Metabolic acidemia   Demand ischemia   Acute renal failure syndrome   Diabetic ketoacidosis without coma associated with type 1 diabetes mellitus   Hyponatremia   Acute diastolic heart failure   Hypoglycemia   Dyspnea   Primary Cardiologist: Dr Irish Lack  Patient Profile: Lance [name] is a 43 y.o. male admitted 12-23-13 with DKA in setting of type I DM, CAP, acute on chronic renal failure, and found to have elevated troponin (peak 1.63) with normal echo.   SUBJECTIVE: Feeling well and wanting to go home. Agrees with doing an ett stress test as an outpatient in 4-6 weeks. LE edema improving  OBJECTIVE Filed Vitals:   12/28/13 1030 12/28/13 1709 12/28/13 2129 12/29/13 0526  BP: 138/75 150/79 168/91 157/94  Pulse: 89 93 95 89  Temp: 97.7 F (36.5 C) 98 F (36.7 C) 98.2 F (36.8 C) 98 F (36.7 C)  TempSrc: Oral Oral Oral Oral  Resp: 19 19 18 17   Height:      Weight:      SpO2: 94% 98% 98% 99%    Intake/Output Summary (Last 24 hours) at 12/29/13 0904 Last data filed at 12/29/13 0600  Gross per 24 hour  Intake    720 ml  Output   1001 ml  Net   -281 ml   Filed Weights   12/24/13 2050 12/25/13 2147 12/26/13 2119  Weight: 240 lb (108.863 kg) 237 lb 1.6 oz (107.548 kg) 234 lb 6.4 oz (106.323 kg)    PHYSICAL EXAM General: Well developed, well nourished, male in no acute distress. Head: Normocephalic, atraumatic.  Neck: Supple without bruits, JVD minimal elevation Lungs:  Resp regular and unlabored, Rales noted on the left. Heart: RRR, S1, S2, no S3, S4, or murmur; no rub. Abdomen: Soft, non-tender, non-distended, BS + x 4.  Extremities: No clubbing, cyanosis, 1+ edema.  Neuro: Alert and oriented X 3. Moves all extremities  spontaneously. Psych: Normal affect.  LABS: CBC:  Recent Labs  12/28/13 0707  WBC 7.9  HGB 11.6*  HCT 34.7*  MCV 91.8  PLT [id number]   Basic Metabolic Panel:  Recent Labs  12/27/13 0423 12/28/13 0707 12/29/13 0600  NA 132* 133* 132*  K 3.9 3.6* 4.3  CL 100 101 102  CO2 18* 18* 19  GLUCOSE 123* 75 347*  BUN 60* 49* 45*  CREATININE 4.54* 3.62* 3.31*  CALCIUM 7.9* 7.8* 7.9*  PHOS 4.3 3.8  --    Liver Function Tests:  Recent Labs  12/27/13 0423 12/28/13 0707  ALBUMIN 1.3* 1.1*   BNP: PRO B NATRIURETIC PEPTIDE (BNP)  Date/Time Value Ref Range Status  12/23/2013 01:38 PM 6963.0* 0 - 125 pg/mL Final  12/22/2012 07:57 AM 416.8* 0 - 125 pg/mL Final   Hemoglobin A1C: No results for input(s): HGBA1C in the last 72 hours. Fasting Lipid Panel:  Recent Labs  12/28/13 0707  CHOL 174  HDL 39*  LDLCALC 89  TRIG 229*  CHOLHDL 4.5   Thyroid Function Tests:  Recent Labs  12/26/13 1105  TSH 1.760   TELE:  Not on    Current Medications:  . acetaminophen  650 mg Oral TID  . azithromycin  500 mg Oral Daily  . benzonatate  200 mg Oral TID  . carvedilol  6.25 mg Oral BID WC  . cefUROXime  500 mg Oral BID WC  . cloNIDine  0.2 mg Oral QHS  . heparin  5,000 Units Subcutaneous 3 times per day  . insulin aspart  5 Units Subcutaneous TID WC  . insulin detemir  15 Units Subcutaneous QHS  . sodium chloride  3 mL Intravenous Q12H  . torsemide  20 mg Oral Daily      ASSESSMENT AND PLAN: Principal Problem:   DKA (diabetic ketoacidoses) Active Problems:   Essential hypertension, benign   CAP (community acquired pneumonia)   Sepsis due to pneumonia   Metabolic acidemia   Demand ischemia   Acute renal failure syndrome   Diabetic ketoacidosis without coma associated with type 1 diabetes mellitus   Hyponatremia   Acute diastolic heart failure   Hypoglycemia   Dyspnea  Lance [name] is a 43 y.o. male admitted 12-23-13 with DKA in setting of type I DM, CAP, acute  on chronic renal failure, and found to have elevated troponin (peak 1.63) with normal echo.  1. Demand ischemia - Elevated troponin -- No chest pain -- He has been active prior to admission with no chest pain or shortness of breath with activity.  -- Echo with normal EF and no RWMA (no diastolic heart failure noted on echo this admission - grade 2 diastolic dysfunction 0000000) -- Plan myoview after to discharge, as he prefers a treadmill test. He is refusing a chemical stress test at this time.  Given his positive troponin, would plan on waiting 4-6 weeks for exercise nuclear stress test as OP.  Exercise stress now could worsen potential injury to the myocardium.   -- I have set up an appointment with Jory Sims NP on 01/10/14 and an ett nuclear stress test on 02/08/14  2. History of diastolic heart failure -- Home medications included torosemide -- I/O +2.4L. IVF have been discontinued in the setting of worsening LE edema.  -- Continue Torsemide 20mg   3. Acute on chronic renal failure -- Creatinine now trending down 5.24--> 4.54--> 3.31 but not at baseline (baseline 2.2) -- Demadex and losartan/HCTZ being held  4. Hypertension -- Verapamil discontinued due to chronic edema And uptitrated coreg to 6.25mg  BID. Continue Clonidine 0.2mg   -- No ACE or ARB for now with AKI  5. DKA with Type 1 diabetes -- Management per IM  6. Sepsis/CAP -- Management per IM, improving  Signed, THOMPSON, KATHRYN R , PA-C 9:04 AM 12/29/2013  I have examined the patient and reviewed assessment and plan and discussed with patient.  Agree with above as stated.  Consider outpatient stress test depending on sx. Increase carvedilol to 12.5 mg PO BID. Renal function improving.   Airica Schwartzkopf S.

## 2013-12-29 NOTE — Progress Notes (Signed)
Inpatient Diabetes Program Recommendations  AACE/ADA: New Consensus Statement on Inpatient Glycemic Control (2013)  Target Ranges:  Prepandial:   less than 140 mg/dL      Peak postprandial:   less than 180 mg/dL (1-2 hours)      Critically ill patients:  140 - 180 mg/dL   Results for Lance [name], Lance [name] (MRN [id number]) as of 12/29/2013 09:55  Ref. Range 12/28/2013 06:16 12/28/2013 07:24 12/28/2013 07:47 12/28/2013 08:48 12/28/2013 12:04 12/28/2013 16:52 12/28/2013 21:25 12/29/2013 07:53  Glucose-Capillary Latest Range: 70-99 mg/dL 70 68 (L) 62 (L) 114 (H) 71 172 (H) 221 (H) 316 (H)   Current orders for Inpatient glycemic control: Levemir 15 units QHS, Novolog 5 units TID with meals  Inpatient Diabetes Program Recommendations Insulin - Basal: Please consider increasing Levemir to 20 units QHS (based on 106 kg x 0.2 units which is 21.2 units for basal needs). Insulin - Meal Coverage: Please consider ordering Novolog correction scale ACHS (in addition to Novolog meal coverage).  Note: Noted hypoglycemia yesterday morning after patient received Levemir 25 units at bedtime on 12/27/13. Patient received Levemir 15 units last night at bedtime and fasting this morning is 316 mg/dl. Please consider increasing Levemir to 20 units QHS and ordering Novolog correction scale ACHS.  Thanks, [name], RN, MSN, CCRN, CDE Diabetes Coordinator Inpatient Diabetes Program [phone] (Team Pager) [phone] ([facility] office) [phone] [facility] office)

## 2013-12-29 NOTE — Plan of Care (Signed)
Problem: Discharge Progression Outcomes Goal: Activity appropriate for discharge plan Outcome: Completed/Met Date Met:  12/29/13

## 2013-12-29 NOTE — Plan of Care (Signed)
Problem: Discharge Progression Outcomes Goal: Complications resolved/controlled Outcome: Completed/Met Date Met:  12/29/13

## 2013-12-29 NOTE — Plan of Care (Signed)
Problem: Discharge Progression Outcomes Goal: Discharge plan in place and appropriate Outcome: Completed/Met Date Met:  12/29/13     

## 2013-12-29 NOTE — Progress Notes (Signed)
Discharge instructions reviewed with pt; allowing time for questions. Pt verbalized understanding. IV removed without issue. Prescriptions given to pt. Pt leaving unit via wheelchair accompanied by Probation officer.

## 2013-12-29 NOTE — Plan of Care (Signed)
Problem: Discharge Progression Outcomes Goal: Pt. states knowledge of home DM medications Outcome: Completed/Met Date Met:  12/29/13

## 2013-12-29 NOTE — Plan of Care (Signed)
Problem: Phase III Progression Outcomes Goal: Discharge plan remains appropriate-arrangements made Outcome: Completed/Met Date Met:  12/29/13

## 2013-12-29 NOTE — Plan of Care (Signed)
Problem: Phase III Progression Outcomes Goal: Tolerating diet Outcome: Completed/Met Date Met:  12/29/13     

## 2013-12-29 NOTE — Plan of Care (Signed)
Problem: Discharge Progression Outcomes Goal: CBGs controlled on DM discharge meds Outcome: Completed/Met Date Met:  12/29/13

## 2013-12-29 NOTE — Plan of Care (Signed)
Problem: Phase III Progression Outcomes Goal: Convert IV antibiotics to PO Outcome: Completed/Met Date Met:  12/29/13

## 2013-12-30 LAB — CULTURE, BLOOD (ROUTINE X 2)
CULTURE: NO GROWTH
Culture: NO GROWTH

## 2014-01-10 ENCOUNTER — Encounter: Payer: BC Managed Care – PPO | Admitting: Adult Health

## 2014-01-17 ENCOUNTER — Encounter: Payer: BC Managed Care – PPO | Admitting: Adult Health

## 2014-01-18 NOTE — Progress Notes (Signed)
   Error NO SHOW

## 2014-01-19 ENCOUNTER — Encounter: Payer: BC Managed Care – PPO | Admitting: Adult Health

## 2014-01-19 ENCOUNTER — Encounter: Payer: Self-pay | Admitting: Adult Health

## 2014-01-21 ENCOUNTER — Encounter (HOSPITAL_COMMUNITY): Payer: BC Managed Care – PPO

## 2014-01-24 ENCOUNTER — Other Ambulatory Visit: Payer: Self-pay | Admitting: Adult Health

## 2014-02-08 ENCOUNTER — Inpatient Hospital Stay (HOSPITAL_COMMUNITY): Admit: 2014-02-08 | Payer: BC Managed Care – PPO

## 2014-02-18 ENCOUNTER — Telehealth: Payer: Self-pay

## 2014-02-18 DIAGNOSIS — R0602 Shortness of breath: Secondary | ICD-10-CM

## 2014-02-18 DIAGNOSIS — I248 Other forms of acute ischemic heart disease: Secondary | ICD-10-CM

## 2014-02-18 NOTE — Telephone Encounter (Signed)
Orders placed.

## 2014-02-18 NOTE — Telephone Encounter (Signed)
-----   Message from Lendon Colonel, NP sent at 02/18/2014 10:36 AM EST ----- Regarding: RE: ? stress test NM Multi SPECT. Lexiscan if patient cannot walk. Lexiscan is used to increase HR as if he is walking in conjunction with NM study. Used for patients who have BKA or other issues preventing them from ambulating.  ----- Message -----    From: Bernita Raisin, RN    Sent: 02/18/2014   9:37 AM      To: Lendon Colonel, NP Subject: ? stress test                                  Who ordered and what kind of stress test does pt need?Lavella Lemons asked me to to place orders as test is Monday   thanks

## 2014-02-21 ENCOUNTER — Ambulatory Visit (HOSPITAL_COMMUNITY): Payer: MEDICAID

## 2014-02-21 ENCOUNTER — Inpatient Hospital Stay (HOSPITAL_COMMUNITY): Admission: RE | Admit: 2014-02-21 | Payer: BC Managed Care – PPO | Source: Ambulatory Visit

## 2014-02-21 ENCOUNTER — Encounter (HOSPITAL_COMMUNITY): Payer: Self-pay

## 2014-02-21 ENCOUNTER — Ambulatory Visit (HOSPITAL_COMMUNITY): Payer: Self-pay

## 2014-02-28 ENCOUNTER — Ambulatory Visit (HOSPITAL_COMMUNITY): Payer: MEDICAID

## 2014-02-28 ENCOUNTER — Ambulatory Visit (HOSPITAL_COMMUNITY): Payer: Self-pay

## 2014-02-28 ENCOUNTER — Encounter (HOSPITAL_COMMUNITY): Payer: Self-pay

## 2014-05-05 ENCOUNTER — Inpatient Hospital Stay (HOSPITAL_COMMUNITY)
Admission: EM | Admit: 2014-05-05 | Discharge: 2014-05-07 | DRG: 194 | Disposition: A | Discharge status: Home / Self Care | Payer: BLUE CROSS/BLUE SHIELD | Attending: Internal Medicine | Admitting: Internal Medicine

## 2014-05-05 ENCOUNTER — Encounter (HOSPITAL_COMMUNITY): Payer: Self-pay

## 2014-05-05 ENCOUNTER — Emergency Department (HOSPITAL_COMMUNITY): Payer: BLUE CROSS/BLUE SHIELD

## 2014-05-05 DIAGNOSIS — Z23 Encounter for immunization: Secondary | ICD-10-CM | POA: Diagnosis not present

## 2014-05-05 DIAGNOSIS — N179 Acute kidney failure, unspecified: Secondary | ICD-10-CM | POA: Diagnosis present

## 2014-05-05 DIAGNOSIS — Z833 Family history of diabetes mellitus: Secondary | ICD-10-CM | POA: Diagnosis not present

## 2014-05-05 DIAGNOSIS — M1990 Unspecified osteoarthritis, unspecified site: Secondary | ICD-10-CM | POA: Diagnosis present

## 2014-05-05 DIAGNOSIS — N183 Chronic kidney disease, stage 3 unspecified: Secondary | ICD-10-CM | POA: Diagnosis present

## 2014-05-05 DIAGNOSIS — E1021 Type 1 diabetes mellitus with diabetic nephropathy: Secondary | ICD-10-CM | POA: Diagnosis present

## 2014-05-05 DIAGNOSIS — N186 End stage renal disease: Secondary | ICD-10-CM

## 2014-05-05 DIAGNOSIS — Z8701 Personal history of pneumonia (recurrent): Secondary | ICD-10-CM | POA: Diagnosis not present

## 2014-05-05 DIAGNOSIS — Z96652 Presence of left artificial knee joint: Secondary | ICD-10-CM | POA: Diagnosis present

## 2014-05-05 DIAGNOSIS — J189 Pneumonia, unspecified organism: Principal | ICD-10-CM

## 2014-05-05 DIAGNOSIS — I129 Hypertensive chronic kidney disease with stage 1 through stage 4 chronic kidney disease, or unspecified chronic kidney disease: Secondary | ICD-10-CM | POA: Diagnosis present

## 2014-05-05 DIAGNOSIS — I10 Essential (primary) hypertension: Secondary | ICD-10-CM

## 2014-05-05 DIAGNOSIS — E1022 Type 1 diabetes mellitus with diabetic chronic kidney disease: Secondary | ICD-10-CM | POA: Diagnosis present

## 2014-05-05 DIAGNOSIS — Z87891 Personal history of nicotine dependence: Secondary | ICD-10-CM

## 2014-05-05 DIAGNOSIS — Z794 Long term (current) use of insulin: Secondary | ICD-10-CM | POA: Diagnosis not present

## 2014-05-05 DIAGNOSIS — N184 Chronic kidney disease, stage 4 (severe): Secondary | ICD-10-CM | POA: Diagnosis present

## 2014-05-05 DIAGNOSIS — I5032 Chronic diastolic (congestive) heart failure: Secondary | ICD-10-CM | POA: Diagnosis present

## 2014-05-05 DIAGNOSIS — R0602 Shortness of breath: Secondary | ICD-10-CM | POA: Diagnosis not present

## 2014-05-05 DIAGNOSIS — E782 Mixed hyperlipidemia: Secondary | ICD-10-CM | POA: Diagnosis present

## 2014-05-05 DIAGNOSIS — E785 Hyperlipidemia, unspecified: Secondary | ICD-10-CM | POA: Diagnosis present

## 2014-05-05 DIAGNOSIS — I160 Hypertensive urgency: Secondary | ICD-10-CM | POA: Diagnosis present

## 2014-05-05 LAB — TROPONIN I: Troponin I: <0.03 ng/mL (ref <0.031)

## 2014-05-05 LAB — MRSA PCR SCREENING: MRSA by PCR: NEGATIVE

## 2014-05-05 LAB — INFLUENZA PANEL BY PCR (TYPE A & B)
H1N1FLUPCR: NOT DETECTED
Influenza A By PCR: NEGATIVE
Influenza B By PCR: NEGATIVE

## 2014-05-05 LAB — CBC WITH DIFFERENTIAL/PLATELET
BASOS PCT: 1 % (ref 0–1)
Basophils Absolute: 0.1 10*3/uL (ref 0.0–0.1)
EOS PCT: 3 % (ref 0–5)
Eosinophils Absolute: 0.3 10*3/uL (ref 0.0–0.7)
HEMATOCRIT: 33.9 % — AB (ref 39.0–52.0)
Hemoglobin: 11 g/dL — ABNORMAL LOW (ref 13.0–17.0)
Lymphocytes Relative: 18 % (ref 12–46)
Lymphs Abs: 1.8 10*3/uL (ref 0.7–4.0)
MCH: 29.1 pg (ref 26.0–34.0)
MCHC: 32.4 g/dL (ref 30.0–36.0)
MCV: 89.7 fL (ref 78.0–100.0)
MONO ABS: 0.6 10*3/uL (ref 0.1–1.0)
MONOS PCT: 6 % (ref 3–12)
Neutro Abs: 7.3 10*3/uL (ref 1.7–7.7)
Neutrophils Relative %: 72 % (ref 43–77)
Platelets: 267 10*3/uL (ref 150–400)
RBC: 3.78 MIL/uL — ABNORMAL LOW (ref 4.22–5.81)
RDW: 14.9 % (ref 11.5–15.5)
WBC: 10.1 10*3/uL (ref 4.0–10.5)

## 2014-05-05 LAB — GLUCOSE, CAPILLARY
GLUCOSE-CAPILLARY: 149 mg/dL — AB (ref 70–99)
Glucose-Capillary: 178 mg/dL — ABNORMAL HIGH (ref 70–99)

## 2014-05-05 LAB — BASIC METABOLIC PANEL
Anion gap: 7 (ref 5–15)
BUN: 43 mg/dL — ABNORMAL HIGH (ref 6–23)
CO2: 19 mmol/L (ref 19–32)
Calcium: 7.8 mg/dL — ABNORMAL LOW (ref 8.4–10.5)
Chloride: 113 mmol/L — ABNORMAL HIGH (ref 96–112)
Creatinine, Ser: 4.61 mg/dL — ABNORMAL HIGH (ref 0.50–1.35)
GFR, EST AFRICAN AMERICAN: 16 mL/min — AB (ref >90)
GFR, EST NON AFRICAN AMERICAN: 14 mL/min — AB (ref >90)
Glucose, Bld: 88 mg/dL (ref 70–99)
Potassium: 3.7 mmol/L (ref 3.5–5.1)
Sodium: 139 mmol/L (ref 135–145)

## 2014-05-05 LAB — URINALYSIS, ROUTINE W REFLEX MICROSCOPIC
Bilirubin Urine: NEGATIVE
Glucose, UA: 100 mg/dL — AB
Ketones, ur: NEGATIVE mg/dL
LEUKOCYTES UA: NEGATIVE
NITRITE: NEGATIVE
Protein, ur: >300 mg/dL — AB
SPECIFIC GRAVITY, URINE: 1.02 (ref 1.005–1.030)
Urobilinogen, UA: 0.2 mg/dL (ref 0.0–1.0)
pH: 6.5 (ref 5.0–8.0)

## 2014-05-05 LAB — BRAIN NATRIURETIC PEPTIDE: B Natriuretic Peptide: 1035 pg/mL — ABNORMAL HIGH (ref 0.0–100.0)

## 2014-05-05 LAB — STREP PNEUMONIAE URINARY ANTIGEN: Strep Pneumo Urinary Antigen: NEGATIVE

## 2014-05-05 LAB — URINE MICROSCOPIC-ADD ON

## 2014-05-05 LAB — CBG MONITORING, ED: GLUCOSE-CAPILLARY: 89 mg/dL (ref 70–99)

## 2014-05-05 MED ORDER — INSULIN ASPART 100 UNIT/ML ~~LOC~~ SOLN
0.0000 [IU] | Freq: Three times a day (TID) | SUBCUTANEOUS | Status: DC
  Administered 2014-05-05: 3 [IU] via SUBCUTANEOUS
  Administered 2014-05-06: 2 [IU] via SUBCUTANEOUS
  Administered 2014-05-07: 3 [IU] via SUBCUTANEOUS

## 2014-05-05 MED ORDER — HYDRALAZINE HCL 20 MG/ML IJ SOLN
10.0000 mg | INTRAMUSCULAR | Status: DC | PRN
  Administered 2014-05-05 (×2): 10 mg via INTRAVENOUS
  Filled 2014-05-05 (×2): qty 1

## 2014-05-05 MED ORDER — INFLUENZA VAC SPLIT QUAD 0.5 ML IM SUSY
0.5000 mL | PREFILLED_SYRINGE | INTRAMUSCULAR | Status: AC
  Administered 2014-05-06: 0.5 mL via INTRAMUSCULAR
  Filled 2014-05-05: qty 0.5

## 2014-05-05 MED ORDER — SODIUM CHLORIDE 0.45 % IV SOLN
INTRAVENOUS | Status: AC
  Administered 2014-05-05: 13:00:00 via INTRAVENOUS

## 2014-05-05 MED ORDER — DEXTROSE 5 % IV SOLN
1.0000 g | INTRAVENOUS | Status: DC
  Administered 2014-05-06 – 2014-05-07 (×2): 1 g via INTRAVENOUS
  Filled 2014-05-05 (×4): qty 10

## 2014-05-05 MED ORDER — PNEUMOCOCCAL VAC POLYVALENT 25 MCG/0.5ML IJ INJ
0.5000 mL | INJECTION | INTRAMUSCULAR | Status: AC
  Administered 2014-05-06: 0.5 mL via INTRAMUSCULAR
  Filled 2014-05-05: qty 0.5

## 2014-05-05 MED ORDER — LORAZEPAM 2 MG/ML IJ SOLN
1.0000 mg | INTRAMUSCULAR | Status: DC | PRN
  Administered 2014-05-05: 1 mg via INTRAVENOUS
  Filled 2014-05-05: qty 1

## 2014-05-05 MED ORDER — DEXTROSE 5 % IV SOLN
500.0000 mg | INTRAVENOUS | Status: DC
  Administered 2014-05-06: 500 mg via INTRAVENOUS
  Filled 2014-05-05 (×3): qty 500

## 2014-05-05 MED ORDER — AMLODIPINE BESYLATE 5 MG PO TABS
10.0000 mg | ORAL_TABLET | Freq: Every day | ORAL | Status: DC
  Administered 2014-05-05 – 2014-05-07 (×3): 10 mg via ORAL
  Filled 2014-05-05 (×3): qty 2

## 2014-05-05 MED ORDER — ALBUTEROL SULFATE (2.5 MG/3ML) 0.083% IN NEBU: 2.5000 mg | INHALATION_SOLUTION | Freq: Four times a day (QID) | RESPIRATORY_TRACT | Status: DC

## 2014-05-05 MED ORDER — IPRATROPIUM-ALBUTEROL 0.5-2.5 (3) MG/3ML IN SOLN
3.0000 mL | Freq: Four times a day (QID) | RESPIRATORY_TRACT | Status: DC
  Administered 2014-05-05 – 2014-05-06 (×3): 3 mL via RESPIRATORY_TRACT
  Filled 2014-05-05 (×3): qty 3

## 2014-05-05 MED ORDER — BENZONATATE 100 MG PO CAPS
100.0000 mg | ORAL_CAPSULE | Freq: Three times a day (TID) | ORAL | Status: DC | PRN
Start: 2014-05-05 — End: 2014-05-07

## 2014-05-05 MED ORDER — GUAIFENESIN ER 600 MG PO TB12
1200.0000 mg | ORAL_TABLET | Freq: Two times a day (BID) | ORAL | Status: DC
  Administered 2014-05-05 – 2014-05-07 (×5): 1200 mg via ORAL
  Filled 2014-05-05 (×5): qty 2

## 2014-05-05 MED ORDER — HEPARIN SODIUM (PORCINE) 5000 UNIT/ML IJ SOLN
5000.0000 [IU] | Freq: Three times a day (TID) | INTRAMUSCULAR | Status: DC
  Administered 2014-05-05 – 2014-05-07 (×6): 5000 [IU] via SUBCUTANEOUS
  Filled 2014-05-05 (×6): qty 1

## 2014-05-05 MED ORDER — INSULIN ASPART 100 UNIT/ML ~~LOC~~ SOLN
10.0000 [IU] | Freq: Three times a day (TID) | SUBCUTANEOUS | Status: DC
  Administered 2014-05-05 – 2014-05-07 (×4): 10 [IU] via SUBCUTANEOUS

## 2014-05-05 MED ORDER — CARVEDILOL 12.5 MG PO TABS
50.0000 mg | ORAL_TABLET | Freq: Two times a day (BID) | ORAL | Status: DC
  Administered 2014-05-05 – 2014-05-07 (×5): 50 mg via ORAL
  Filled 2014-05-05 (×6): qty 4

## 2014-05-05 MED ORDER — INSULIN DETEMIR 100 UNIT/ML ~~LOC~~ SOLN
30.0000 [IU] | Freq: Every day | SUBCUTANEOUS | Status: DC
  Administered 2014-05-05 – 2014-05-06 (×2): 30 [IU] via SUBCUTANEOUS
  Filled 2014-05-05 (×4): qty 0.3

## 2014-05-05 MED ORDER — LEVOFLOXACIN IN D5W 500 MG/100ML IV SOLN
500.0000 mg | Freq: Once | INTRAVENOUS | Status: AC
  Administered 2014-05-05: 500 mg via INTRAVENOUS
  Filled 2014-05-05: qty 100

## 2014-05-05 MED ORDER — LORAZEPAM 1 MG PO TABS
1.0000 mg | ORAL_TABLET | Freq: Four times a day (QID) | ORAL | Status: DC | PRN
  Administered 2014-05-05 (×2): 1 mg via ORAL
  Filled 2014-05-05 (×2): qty 2

## 2014-05-05 MED ORDER — IPRATROPIUM BROMIDE 0.02 % IN SOLN: 0.5000 mg | Freq: Four times a day (QID) | RESPIRATORY_TRACT | Status: DC

## 2014-05-05 MED ORDER — ALBUTEROL SULFATE (2.5 MG/3ML) 0.083% IN NEBU
2.5000 mg | INHALATION_SOLUTION | RESPIRATORY_TRACT | Status: DC | PRN
  Administered 2014-05-05: 2.5 mg via RESPIRATORY_TRACT
  Filled 2014-05-05: qty 3

## 2014-05-05 MED ORDER — CEFTRIAXONE SODIUM IN DEXTROSE 20 MG/ML IV SOLN
1.0000 g | INTRAVENOUS | Status: DC
  Filled 2014-05-05: qty 50

## 2014-05-05 NOTE — ED Provider Notes (Signed)
CSN: [id number]     Arrival date & time 05/05/14  0615 History   First MD Initiated Contact with Patient 05/05/14 [phone]     Chief Complaint  Patient presents with  . Shortness of Breath     (Consider location/radiation/quality/duration/timing/severity/associated sxs/prior Treatment) HPI  This is a 44 year old male with history of diabetes, hypertension, hyperlipidemia, PSVT, diastolic heart failure, and chronic kidney disease who presents with shortness of breath. Patient reports that he woke up this morning with worsening shortness of breath. He states that he was diagnosed with pneumonia in November and since that time he has not felt any better. He states that several weeks ago he got put on a second course of antibiotics because his doctor thought he may have pneumonia coming back. Patient denies any fevers. He does report mild cough that is nonproductive. Patient states that he feels like "I'm gurgling." He denies any chest pain. Denies any history of blood clots, lower extremity swelling, recent travel or hospitalization. He is unsure whether his symptoms get worse when he lays flat because he hasn't lays flat. Denies any recent changes in medications.  Based on records review, patient was admitted for pneumonia in November to [facility]. At that time noted to be hyperglycemic. He also had an elevated troponin and was to follow-up with cardiology as an outpatient for stress testing. Stress test appears to have been ordered but patient was a no-show.   Past Medical History  Diagnosis Date  . Type 1 diabetes mellitus 1990  . Essential hypertension, benign 2000    LVH  . Hyperlipidemia 2000  . Aortic stenosis     Very mild in 05/2007  . PSVT (paroxysmal supraventricular tachycardia)     Nonsustained; asymptomatic; diagnosed by event recorder in 2006  . Degenerative joint disease     Left TKA  . Tobacco abuse, in remission     20 pack years; discontinued in 1985; bronchitic changes on  chest x-ray and 2009  . Diastolic heart failure     LVEF 65-70% with grade 2 diastolic dysfunction  . CKD (chronic kidney disease) stage 3, GFR 30-59 ml/min    Past Surgical History  Procedure Laterality Date  . Total knee arthroplasty      Left  . Lumbar spine surgery      Multiple procedures   Family History  Problem Relation Age of Onset  . Diabetes Father   . Diabetes Mother    History  Substance Use Topics  . Smoking status: Never Smoker   . Smokeless tobacco: Not on file  . Alcohol Use: No    Review of Systems  Constitutional: Negative.  Negative for fever.  Respiratory: Positive for cough and shortness of breath. Negative for chest tightness and wheezing.   Cardiovascular: Negative.  Negative for chest pain and leg swelling.  Gastrointestinal: Negative.  Negative for nausea, vomiting and abdominal pain.  Genitourinary: Negative.  Negative for dysuria.  Musculoskeletal: Negative for back pain.  Neurological: Negative for headaches.  All other systems reviewed and are negative.     Allergies  Atorvastatin and Minoxidil  Home Medications   Prior to Admission medications   Medication Sig Start Date End Date Taking? Authorizing Provider  amLODipine (NORVASC) 10 MG tablet Take 10 mg by mouth daily.   Yes Historical Provider, MD  carvedilol (COREG) 25 MG tablet TAKE 2 TABLETS BY MOUTH TWICE A DAY 01/24/14  Yes Satira Sark, MD  insulin aspart (NOVOLOG) 100 UNIT/ML injection Inject 10 Units  into the skin 3 (three) times daily before meals. 12/29/13  Yes Delfina Redwood, MD  losartan (COZAAR) 100 MG tablet Take 1 tablet (100 mg total) by mouth daily. 12/29/13  Yes Delfina Redwood, MD  Multiple Vitamin (MULTIVITAMIN) capsule Take 1 capsule by mouth daily.   Yes Historical Provider, MD  torsemide (DEMADEX) 20 MG tablet Take 3 tablets (60 mg total) by mouth daily. 12/29/13  Yes Delfina Redwood, MD  benzonatate (TESSALON) 100 MG capsule Take 1 capsule (100 mg  total) by mouth 3 (three) times daily as needed for cough. 12/29/13   Delfina Redwood, MD  cefUROXime (CEFTIN) 500 MG tablet Take 1 tablet (500 mg total) by mouth 2 (two) times daily with a meal. 12/29/13   Delfina Redwood, MD  insulin detemir (LEVEMIR) 100 UNIT/ML injection Inject 0.2 mLs (20 Units total) into the skin at bedtime. Patient taking differently: Inject 30 Units into the skin at bedtime.  12/29/13   Delfina Redwood, MD  potassium chloride SA (K-DUR,KLOR-CON) 20 MEQ tablet Take 1 tablet (20 mEq total) by mouth daily. Patient taking differently: Take 20 mEq by mouth daily as needed (for low potassium). Take only when taking Metolazone 03/29/13   Lendon Colonel, NP   There were no vitals taken for this visit. Physical Exam  Constitutional: He is oriented to person, place, and time. He appears well-developed and well-nourished. No distress.  HENT:  Head: Normocephalic and atraumatic.  Neck: Neck supple. No JVD present.  Cardiovascular: Normal rate, regular rhythm and normal heart sounds.   No murmur heard. Pulmonary/Chest: Effort normal and breath sounds normal. No respiratory distress. He has no wheezes.  Coarse breath sounds bilateral lower lobes  Abdominal: Soft. Bowel sounds are normal. There is no tenderness. There is no rebound.  Musculoskeletal: He exhibits edema.  1+ lower extremity edema symmetric bilaterally  Neurological: He is alert and oriented to person, place, and time.  Skin: Skin is warm and dry.  Multiple scars noted over the chest including a large trocar left upper chest in the shape of an Omega  Psychiatric: He has a normal mood and affect.  Nursing note and vitals reviewed.   ED Course  Procedures (including critical care time) Labs Review Labs Reviewed  CBC WITH DIFFERENTIAL/PLATELET  BASIC METABOLIC PANEL  BRAIN NATRIURETIC PEPTIDE  TROPONIN I    Imaging Review Dg Chest 2 View  05/05/2014   CLINICAL DATA:  Shortness of breath and  cough this morning.  EXAM: CHEST  2 VIEW  COMPARISON:  12/28/2013  FINDINGS: The previous left upper lobe consolidation has improved, minimal residual linear opacities persists likely scarring. There is new moderate patchy airspace consolidation in the right lower and right middle lobes. Cardiomediastinal contours are unchanged. Mild interstitial prominence and central bronchial thickening. Left costophrenic angle is excluded from the PA view. No large pleural effusion, no pneumothorax. No acute osseous abnormalities.  IMPRESSION: New consolidation in the right lower and right middle lobes consistent with pneumonia. This is superimposed on bronchial thickening suspicious for bronchitis.   Electronically Signed   By: Jeb Levering M.D.   On: 05/05/2014 06:44     EKG Interpretation   Date/Time:  Thursday May 05 2014 06:24:26 EDT Ventricular Rate:  91 PR Interval:  155 QRS Duration: 89 QT Interval:  390 QTC Calculation: 480 R Axis:   89 Text Interpretation:  Sinus rhythm Probable left atrial enlargement Left  ventricular hypertrophy ST elev, probable normal early repol pattern  Borderline prolonged QT interval Similar to prior Confirmed by HORTON  MD,  [place] ([id number]) on 05/05/2014 6:27:33 AM      MDM   Final diagnoses:  None    Patient presents with shortness of breath. History of pneumonia in November. Per chart review, patient was admitted for pneumonia, acute on chronic renal failure, and DKA. He is afebrile. Mildly tachypnea With increased breath sounds and coarse breath sounds bilaterally. Chest x-ray shows new consolidation in the right lower and middle lobes consistent with pneumonia. He was given azithromycin and Rocephin during admission and I am unsure if the antibiotics he was given several weeks ago. Given this, we'll give a dose of Levaquin. Blood cultures were obtained.  Lab work is pending. Patient also noted to be hypertensive. Has not taken his blood pressure medications  this morning. Suspect patient may need admission for further management of his pneumonia.  Merryl Hacker, MD 05/05/14 [phone]

## 2014-05-05 NOTE — ED Provider Notes (Addendum)
Pt seen and examined.  History reviewed.  Care discussed with Dr. Dina Rich.  Patient clinically and radiographically has a  right lower lobe pneumonia. Actually oxygenating well at 98% with dyspnea with conversation. Recheck BP 226/134, I have ordered IV  Hydralazine.  Patient with acute kidney injury on chronic renal insufficiency. Left the hospital in November with baseline creatinine 3.3. Elevated at 4.6 today.  Summary of acute problem list includes acute right lower lobe community acquired pneumonia. Has been given IV  Levaquin after blood cultures obtained. Also has hypertensive crisis with acute renal insufficiency and uncontrolled blood pressure. I have placed a call to the hospitalist regarding admission.  Tanna Furry, MD 05/05/14 [id number]  Tanna Furry, MD 05/05/14 [phone]

## 2014-05-05 NOTE — Progress Notes (Signed)
44 yo M admitted with CAP.  Acute on chronic renal insufficiency noted.  Patient received dose of Levaquin 500mg  IV in ED.  Rocephin/Zithromax do not require renal dose adjustment.  Since patient received Levaquin dose will delay Zithromax until tomorrow eventing to minimize risk of Torsades per hospital policy.   Pharmacy to sign off.  Please re-consult as needed.   Netta Cedars, PharmD, BCPS 05/05/2014@12 :50 PM

## 2014-05-05 NOTE — ED Notes (Signed)
Pt states he had pneumonia in November and since then he has not really felt any better, continues to be sob, mild cough

## 2014-05-05 NOTE — ED Notes (Signed)
RN called to room by patient. Patient states increasing shortness of breath. Patient found on end of bed in tripod position, anxious. RR 30 and shallow, oxygen sats 96%. RN coached patient to slow respirations and to take deeper breaths. Patient decreased RR to 24 with coaching. Patient remains hypertensive 257/124. MD consulted and in to room to re-assess patient. Further orders received. RT called for breathing treatment and primary RN updated on patient and plan of care.

## 2014-05-05 NOTE — H&P (Signed)
[facility] History and Physical  Lance [name] [id number] DOB: [birth year] DOA: 05/05/2014  Referring physician: Dr. Tanna Furry PCP: [name], MD   Chief Complaint: Shortness of breath  HPI: Lance [name] is a 44 y.o. male who presents to the emergency room with complaints of shortness of breath. He reports that he was last hospitalized in 12/2013 for pneumonia. He feels that he has not quite recovered in terms of his breathing since then. On further questioning, he reports that he's had intermittent episodes of shortness of breath and wheezing since that time. His current presentation is related to worsening shortness of breath that began last night. He's not had a significant cough. He has not really had any fever. He denies any worsening peripheral edema. No chest pain. No nausea, vomiting, diarrhea, dysuria. He was evaluated in the emergency room when he was noted to have increased work of breathing, despite adequate oxygen saturations on room air. Chest x-ray indicated developing right lower lobe pneumonia. He was also noted to markedly hypertensive with systolic blood pressures greater than 200. He is admitted for further treatments.   Review of Systems:  Pertinent positives as per history of present illness, otherwise negative  Past Medical History  Diagnosis Date  . Type 1 diabetes mellitus 1990  . Essential hypertension, benign 2000    LVH  . Hyperlipidemia 2000  . Aortic stenosis     Very mild in 05/2007  . PSVT (paroxysmal supraventricular tachycardia)     Nonsustained; asymptomatic; diagnosed by event recorder in 2006  . Degenerative joint disease     Left TKA  . Tobacco abuse, in remission     20 pack years; discontinued in 1985; bronchitic changes on chest x-ray and 2009  . Diastolic heart failure     LVEF 65-70% with grade 2 diastolic dysfunction  . CKD (chronic kidney disease) stage 3, GFR 30-59 ml/min    Past Surgical History  Procedure Laterality  Date  . Total knee arthroplasty      Left  . Lumbar spine surgery      Multiple procedures   Social History:  reports that he has never smoked. He does not have any smokeless tobacco history on file. He reports that he does not drink alcohol or use illicit drugs.  Allergies  Allergen Reactions  . Atorvastatin Other (See Comments)    Myalgias  . Minoxidil Other (See Comments)    Fluid retention    Family History  Problem Relation Age of Onset  . Diabetes Father   . Diabetes Mother     Prior to Admission medications   Medication Sig Start Date End Date Taking? Authorizing Provider  amLODipine (NORVASC) 10 MG tablet Take 10 mg by mouth daily.   Yes Historical Provider, MD  carvedilol (COREG) 25 MG tablet TAKE 2 TABLETS BY MOUTH TWICE A DAY 01/24/14  Yes Satira Sark, MD  insulin aspart (NOVOLOG) 100 UNIT/ML injection Inject 10 Units into the skin 3 (three) times daily before meals. 12/29/13  Yes Delfina Redwood, MD  insulin detemir (LEVEMIR) 100 UNIT/ML injection Inject 0.2 mLs (20 Units total) into the skin at bedtime. Patient taking differently: Inject 30 Units into the skin at bedtime.  12/29/13  Yes Delfina Redwood, MD  losartan (COZAAR) 100 MG tablet Take 1 tablet (100 mg total) by mouth daily. 12/29/13  Yes Delfina Redwood, MD  Multiple Vitamin (MULTIVITAMIN) capsule Take 1 capsule by mouth daily.   Yes Historical Provider, MD  torsemide (DEMADEX) 20 MG tablet Take 3 tablets (60 mg total) by mouth daily. 12/29/13  Yes Delfina Redwood, MD  benzonatate (TESSALON) 100 MG capsule Take 1 capsule (100 mg total) by mouth 3 (three) times daily as needed for cough. 12/29/13   Delfina Redwood, MD  cefUROXime (CEFTIN) 500 MG tablet Take 1 tablet (500 mg total) by mouth 2 (two) times daily with a meal. 12/29/13   Delfina Redwood, MD  potassium chloride SA (K-DUR,KLOR-CON) 20 MEQ tablet Take 1 tablet (20 mEq total) by mouth daily. Patient taking differently: Take 20  mEq by mouth daily as needed (for low potassium). Take only when taking Metolazone 03/29/13   Lendon Colonel, NP   Physical Exam: Filed Vitals:   05/05/14 0940 05/05/14 1000 05/05/14 1100 05/05/14 1115  BP:  168/92 148/84   Pulse:  90 79 84  Temp:      TempSrc:      Resp:  24 18 17   Height:      Weight:      SpO2: 100% 98% 93% 96%    Wt Readings from Last 3 Encounters:  05/05/14 108.863 kg (240 lb)  12/26/13 106.323 kg (234 lb 6.4 oz)  04/14/13 110.133 kg (242 lb 12.8 oz)    General:  Appears calm and comfortable Eyes: PERRL, normal lids, irises & conjunctiva ENT: grossly normal hearing, lips & tongue Neck: no LAD, masses or thyromegaly Cardiovascular: RRR, no m/r/g. 1+ LE edema. Telemetry: SR, no arrhythmias  Respiratory: diminished breath sounds bilaterally. Normal respiratory effort. Abdomen: soft, ntnd Skin: no rash or induration seen on limited exam Musculoskeletal: grossly normal tone BUE/BLE Psychiatric: grossly normal mood and affect, speech fluent and appropriate Neurologic: grossly non-focal.          Labs on Admission:  Basic Metabolic Panel:  Recent Labs Lab 05/05/14 0657  NA 139  K 3.7  CL 113*  CO2 19  GLUCOSE 88  BUN 43*  CREATININE 4.61*  CALCIUM 7.8*   Liver Function Tests: No results for input(s): AST, ALT, ALKPHOS, BILITOT, PROT, ALBUMIN in the last 168 hours. No results for input(s): LIPASE, AMYLASE in the last 168 hours. No results for input(s): AMMONIA in the last 168 hours. CBC:  Recent Labs Lab 05/05/14 0730  WBC 10.1  NEUTROABS 7.3  HGB 11.0*  HCT 33.9*  MCV 89.7  PLT 267   Cardiac Enzymes:  Recent Labs Lab 05/05/14 0657  TROPONINI <0.03    BNP (last 3 results)  Recent Labs  05/05/14 0657  BNP 1035.0*    ProBNP (last 3 results)  Recent Labs  12/23/13 1338  PROBNP 6963.0*    CBG:  Recent Labs Lab 05/05/14 0655  GLUCAP 89    Radiological Exams on Admission: Dg Chest 2 View  05/05/2014    CLINICAL DATA:  Shortness of breath and cough this morning.  EXAM: CHEST  2 VIEW  COMPARISON:  12/28/2013  FINDINGS: The previous left upper lobe consolidation has improved, minimal residual linear opacities persists likely scarring. There is new moderate patchy airspace consolidation in the right lower and right middle lobes. Cardiomediastinal contours are unchanged. Mild interstitial prominence and central bronchial thickening. Left costophrenic angle is excluded from the PA view. No large pleural effusion, no pneumothorax. No acute osseous abnormalities.  IMPRESSION: New consolidation in the right lower and right middle lobes consistent with pneumonia. This is superimposed on bronchial thickening suspicious for bronchitis.   Electronically Signed   By: Fonnie Birkenhead.D.  On: 05/05/2014 06:44    EKG: Independently reviewed. No acute changes  Assessment/Plan Principal Problem:   CAP (community acquired pneumonia) Active Problems:   Diabetes mellitus type 1   Hyperlipidemia   Chronic kidney disease, stage III (moderate)   Chronic diastolic heart failure   Acute renal failure   Hypertensive urgency   1. Community-acquired pneumonia. We'll start the patient on Rocephin and azithromycin. Blood cultures have been sent. We'll also check sputum culture. Check urinary antigens. Check flu PCR. Continue supportive treatments mucolytics and expectorants. Flutter valve. 2. Acute renal failure on chronic kidney disease stage III. Possibly related to dehydration from pneumonia versus elevated blood pressure. Will start on gentle hydration for the next 12 hours. Repeat labs in the morning. 3. Hypertensive urgency. He reports that he's been unable to take his regular scheduled medications today. He also reports his blood pressure is normally fairly controlled on these medications. We'll restart home regimen and supplement with intravenous medications as needed. Hold off on starting ARB in the setting of  worsening renal function. 4. Chronic diastolic congestive heart failure. He does have evidence of lower extremity edema, but does not have any JVP or other signs of pulmonary edema. He reports that the lower extremity edema is chronic. We'll hold off on restarting his Demadex at this time. 5. Diabetes. Continue home regimen and supplement with sliding scale insulin   Code Status: full code DVT Prophylaxis: heparin sq Family Communication: discussed with patient and family at the bedside Disposition Plan: discharge home once improved  Time spent: 81mins  Daivik Overley [facility] Pager [phone]

## 2014-05-06 LAB — CBC
HCT: 32.1 % — ABNORMAL LOW (ref 39.0–52.0)
HEMOGLOBIN: 10.5 g/dL — AB (ref 13.0–17.0)
MCH: 29.1 pg (ref 26.0–34.0)
MCHC: 32.7 g/dL (ref 30.0–36.0)
MCV: 88.9 fL (ref 78.0–100.0)
Platelets: 241 10*3/uL (ref 150–400)
RBC: 3.61 MIL/uL — ABNORMAL LOW (ref 4.22–5.81)
RDW: 14.8 % (ref 11.5–15.5)
WBC: 7.8 10*3/uL (ref 4.0–10.5)

## 2014-05-06 LAB — BASIC METABOLIC PANEL
Anion gap: 5 (ref 5–15)
BUN: 42 mg/dL — AB (ref 6–23)
CHLORIDE: 114 mmol/L — AB (ref 96–112)
CO2: 20 mmol/L (ref 19–32)
Calcium: 8.1 mg/dL — ABNORMAL LOW (ref 8.4–10.5)
Creatinine, Ser: 4.77 mg/dL — ABNORMAL HIGH (ref 0.50–1.35)
GFR, EST AFRICAN AMERICAN: 16 mL/min — AB (ref >90)
GFR, EST NON AFRICAN AMERICAN: 13 mL/min — AB (ref >90)
GLUCOSE: 106 mg/dL — AB (ref 70–99)
POTASSIUM: 3.8 mmol/L (ref 3.5–5.1)
SODIUM: 139 mmol/L (ref 135–145)

## 2014-05-06 LAB — HIV ANTIBODY (ROUTINE TESTING W REFLEX): HIV Screen 4th Generation wRfx: NONREACTIVE

## 2014-05-06 LAB — GLUCOSE, CAPILLARY
GLUCOSE-CAPILLARY: 111 mg/dL — AB (ref 70–99)
GLUCOSE-CAPILLARY: 136 mg/dL — AB (ref 70–99)
Glucose-Capillary: 110 mg/dL — ABNORMAL HIGH (ref 70–99)
Glucose-Capillary: 129 mg/dL — ABNORMAL HIGH (ref 70–99)

## 2014-05-06 LAB — LEGIONELLA ANTIGEN, URINE

## 2014-05-06 MED ORDER — HYDRALAZINE HCL 25 MG PO TABS
25.0000 mg | ORAL_TABLET | Freq: Three times a day (TID) | ORAL | Status: DC
  Administered 2014-05-06 – 2014-05-07 (×4): 25 mg via ORAL
  Filled 2014-05-06 (×4): qty 1

## 2014-05-06 MED ORDER — IPRATROPIUM-ALBUTEROL 0.5-2.5 (3) MG/3ML IN SOLN
3.0000 mL | Freq: Two times a day (BID) | RESPIRATORY_TRACT | Status: DC
  Administered 2014-05-06 – 2014-05-07 (×2): 3 mL via RESPIRATORY_TRACT
  Filled 2014-05-06 (×2): qty 3

## 2014-05-06 MED ORDER — SODIUM CHLORIDE 0.9 % IV SOLN
INTRAVENOUS | Status: DC
  Administered 2014-05-06 – 2014-05-07 (×2): via INTRAVENOUS

## 2014-05-06 MED ORDER — FUROSEMIDE 10 MG/ML IJ SOLN
80.0000 mg | Freq: Two times a day (BID) | INTRAMUSCULAR | Status: DC
  Administered 2014-05-06 – 2014-05-07 (×2): 80 mg via INTRAVENOUS
  Filled 2014-05-06 (×2): qty 8

## 2014-05-06 NOTE — Progress Notes (Signed)
[facility] PROGRESS NOTE  AIDON HERGENRADER [id number] DOB: 05-12-1970 DOA: 05/05/2014 PCP: [name], MD  Assessment/Plan: 1. Community acquired pneumonia. Patient started on Rocephin and azithromycin. He is afebrile and does not have any leukocytosis. Urinary antigens, HIV is currently in process. The patient is feeling significantly improved. He is an bleeding around his room without any oxygen requirement. Does not report any cough at this time. Continue current treatments 2. Acute on chronic kidney disease. Patient briefly received IV fluids overnight. Creatinine appears to be unchanged. Losartan and diuretics held on admission. He is also markedly hypertensive and is also likely playing a role. He follows with outpatient nephrologist and has an appointment next week. Will request nephrology consultation in house to determine if further workup/intervention is needed at this time. 3. Hypertensive urgency. Restarted on home medications with the exception of ARB and diuretics. Patient is still hypertensive. Will start on hydralazine. 4. Chronic diastolic congestive heart failure. Appears to be compensated at this time. 5. Diabetes. Continue home regimen  Code Status: full code Family Communication: discussed with patient Disposition Plan: discharge home once improved   Consultants:    Procedures:    Antibiotics:  Rocephin 3/24  Azithromycin 3/24  HPI/Subjective: Feels shortness of breath has improved. No coughing. Ambulating around room  Objective: Filed Vitals:   05/06/14 0805  BP:   Pulse: 87  Temp:   Resp: 20    Intake/Output Summary (Last 24 hours) at 05/06/14 1010 Last data filed at 05/06/14 0800  Gross per 24 hour  Intake    600 ml  Output   1325 ml  Net   -725 ml   Filed Weights   05/05/14 0636 05/05/14 1410 05/06/14 0500  Weight: 108.863 kg (240 lb) 108 kg (238 lb 1.6 oz) 110.1 kg (242 lb 11.6 oz)    Exam:   General:   NAD  Cardiovascular: S1, S2 RRR  Respiratory: CTA B  Abdomen: soft, nt, nd, bs+  Musculoskeletal: no edema b/l   Data Reviewed: Basic Metabolic Panel:  Recent Labs Lab 05/05/14 0657 05/06/14 0527  NA 139 139  K 3.7 3.8  CL 113* 114*  CO2 19 20  GLUCOSE 88 106*  BUN 43* 42*  CREATININE 4.61* 4.77*  CALCIUM 7.8* 8.1*   Liver Function Tests: No results for input(s): AST, ALT, ALKPHOS, BILITOT, PROT, ALBUMIN in the last 168 hours. No results for input(s): LIPASE, AMYLASE in the last 168 hours. No results for input(s): AMMONIA in the last 168 hours. CBC:  Recent Labs Lab 05/05/14 0730 05/06/14 0527  WBC 10.1 7.8  NEUTROABS 7.3  --   HGB 11.0* 10.5*  HCT 33.9* 32.1*  MCV 89.7 88.9  PLT 267 241   Cardiac Enzymes:  Recent Labs Lab 05/05/14 0657  TROPONINI <0.03   BNP (last 3 results)  Recent Labs  05/05/14 0657  BNP 1035.0*    ProBNP (last 3 results)  Recent Labs  12/23/13 1338  PROBNP 6963.0*    CBG:  Recent Labs Lab 05/05/14 0655 05/05/14 1627 05/05/14 2107 05/06/14 0733  GLUCAP 89 178* 149* 110*    Recent Results (from the past 240 hour(s))  Blood culture (routine x 2)     Status: None (Preliminary result)   Collection Time: 05/05/14  7:00 AM  Result Value Ref Range Status   Specimen Description Blood  Final   Special Requests NONE  Final   Culture NO GROWTH <24 HRS  Final   Report Status PENDING  Incomplete  Blood culture (routine x 2)     Status: None (Preliminary result)   Collection Time: 05/05/14  7:00 AM  Result Value Ref Range Status   Specimen Description Blood  Final   Special Requests NONE  Final   Culture NO GROWTH <24 HRS  Final   Report Status PENDING  Incomplete  MRSA PCR Screening     Status: None   Collection Time: 05/05/14 12:00 PM  Result Value Ref Range Status   MRSA by PCR NEGATIVE NEGATIVE Final    Comment:        The GeneXpert MRSA Assay (FDA approved for NASAL specimens only), is one component of  a comprehensive MRSA colonization surveillance program. It is not intended to diagnose MRSA infection nor to guide or monitor treatment for MRSA infections.      Studies: Dg Chest 2 View  05/05/2014   CLINICAL DATA:  Shortness of breath and cough this morning.  EXAM: CHEST  2 VIEW  COMPARISON:  12/28/2013  FINDINGS: The previous left upper lobe consolidation has improved, minimal residual linear opacities persists likely scarring. There is new moderate patchy airspace consolidation in the right lower and right middle lobes. Cardiomediastinal contours are unchanged. Mild interstitial prominence and central bronchial thickening. Left costophrenic angle is excluded from the PA view. No large pleural effusion, no pneumothorax. No acute osseous abnormalities.  IMPRESSION: New consolidation in the right lower and right middle lobes consistent with pneumonia. This is superimposed on bronchial thickening suspicious for bronchitis.   Electronically Signed   By: Jeb Levering M.D.   On: 05/05/2014 06:44    Scheduled Meds: . amLODipine  10 mg Oral Daily  . azithromycin  500 mg Intravenous Q24H  . carvedilol  50 mg Oral BID  . cefTRIAXone (ROCEPHIN)  IV  1 g Intravenous Q24H  . guaiFENesin  1,200 mg Oral BID  . heparin  5,000 Units Subcutaneous 3 times per day  . hydrALAZINE  25 mg Oral 3 times per day  . Influenza vac split quadrivalent PF  0.5 mL Intramuscular Tomorrow-1000  . insulin aspart  0-15 Units Subcutaneous TID WC  . insulin aspart  10 Units Subcutaneous TID AC  . insulin detemir  30 Units Subcutaneous QHS  . ipratropium-albuterol  3 mL Nebulization BID  . pneumococcal 23 valent vaccine  0.5 mL Intramuscular Tomorrow-1000   Continuous Infusions:   Principal Problem:   CAP (community acquired pneumonia) Active Problems:   Diabetes mellitus type 1   Hyperlipidemia   Chronic kidney disease, stage III (moderate)   Chronic diastolic heart failure   Acute renal failure    Hypertensive urgency    Time spent: 69mins    MEMON,JEHANZEB  [facility] Pager [phone]. If 7PM-7AM, please contact night-coverage at [email or web address], password [facility] 05/06/2014, 10:10 AM  LOS: 1 day

## 2014-05-06 NOTE — Progress Notes (Signed)
Patient transferred to room 327. Report given to Sharyn Blitz RN. Vital signs stable at transfer.

## 2014-05-06 NOTE — Care Management Utilization Note (Signed)
UR completed 

## 2014-05-06 NOTE — Progress Notes (Signed)
Inpatient Diabetes Program Recommendations  AACE/ADA: New Consensus Statement on Inpatient Glycemic Control (2013)  Target Ranges:  Prepandial:   less than 140 mg/dL      Peak postprandial:   less than 180 mg/dL (1-2 hours)      Critically ill patients:  140 - 180 mg/dL   Results for SIMONE, BURRINGTON (MRN [id number]) as of 05/06/2014 10:52  Ref. Range 05/05/2014 06:55 05/05/2014 16:27 05/05/2014 21:07 05/06/2014 07:33  Glucose-Capillary Latest Range: 70-99 mg/dL 89 178 (H) 149 (H) 110 (H)   Outpatient Diabetes medications: Levemir 30 units QHS, Novolog 10 units TID with meals Current orders for Inpatient glycemic control: Levemir 30 units QHS, Novolog 0-15 units TID with meals, Novolog 10 units TID with meals  Inpatient Diabetes Program Recommendations HgbA1C: Please consider adding an A1C to blood in lab or order A1C with next scheduled blood draw to evaluate glycemic control over the past 2-3 months.  Thanks, [name], RN, MSN, CCRN, CDE Diabetes Coordinator Inpatient Diabetes Program [phone] (Team Pager from [place] to [place]) [phone] ([facility] office) [phone] [facility] office)

## 2014-05-06 NOTE — Care Management Note (Signed)
    Page 1 of 1   05/06/2014     11:59:16 AM CARE MANAGEMENT NOTE 05/06/2014  Patient:  Lance [name], Lance [name]   Account Number:  [id number]  Date Initiated:  05/06/2014  Documentation initiated by:  CHILDRESS,JESSICA  Subjective/Objective Assessment:   Pt is from home, lives with wife and kids. Pt indpendent at baseline. Pt has no HH services or DME's. Pt plans to dicharge home with self care. No CM needs.     Action/Plan:   Anticipated DC Date:  05/07/2014   Anticipated DC Plan:  HOME/SELF CARE      DC Planning Services  CM consult      Choice offered to / List presented to:             Status of service:  Completed, signed off Medicare Important Message given?   (If response is "NO", the following Medicare IM given date fields will be blank) Date Medicare IM given:   Medicare IM given by:   Date Additional Medicare IM given:   Additional Medicare IM given by:    Discharge Disposition:  HOME/SELF CARE  Per UR Regulation:  Reviewed for med. necessity/level of care/duration of stay  If discussed at [place] of Stay Meetings, dates discussed:    Comments:  05/06/2014 [place], RN, MSN, CM

## 2014-05-06 NOTE — Consult Note (Signed)
Reason for Consult: Acute kidney injury superimposed on chronic Referring Physician: Dr. Namon Cirri is an 44 y.o. male.  HPI: He is a patient with history of diabetes, chronic renal failure stage IV, hypertension presently came with complaints of difficulty breathing. When he was evaluated a shunt was found to have accelerated hypertension and worsening of renal failure and hence consult is called. Presently patient denies any nausea or vomiting. Patient also states that he has minimal cough and no sputum production. He has been seen by nephrologist from before. He had an appointment next week.  Past Medical History  Diagnosis Date  . Type 1 diabetes mellitus 1990  . Essential hypertension, benign 2000    LVH  . Hyperlipidemia 2000  . Aortic stenosis     Very mild in 05/2007  . PSVT (paroxysmal supraventricular tachycardia)     Nonsustained; asymptomatic; diagnosed by event recorder in 2006  . Degenerative joint disease     Left TKA  . Tobacco abuse, in remission     20 pack years; discontinued in 1985; bronchitic changes on chest x-ray and 2009  . Diastolic heart failure     LVEF 65-70% with grade 2 diastolic dysfunction  . CKD (chronic kidney disease) stage 3, GFR 30-59 ml/min     Past Surgical History  Procedure Laterality Date  . Total knee arthroplasty      Left  . Lumbar spine surgery      Multiple procedures    Family History  Problem Relation Age of Onset  . Diabetes Father   . Diabetes Mother     Social History:  reports that he has never smoked. He does not have any smokeless tobacco history on file. He reports that he does not drink alcohol or use illicit drugs.  Allergies:  Allergies  Allergen Reactions  . Atorvastatin Other (See Comments)    Myalgias  . Minoxidil Other (See Comments)    Fluid retention    Medications: I have reviewed the patient's current medications.  Results for orders placed or performed during the hospital encounter of  05/05/14 (from the past 48 hour(s))  POC CBG, ED     Status: None   Collection Time: 05/05/14  6:55 AM  Result Value Ref Range   Glucose-Capillary 89 70 - 99 mg/dL  Basic metabolic panel     Status: Abnormal   Collection Time: 05/05/14  6:57 AM  Result Value Ref Range   Sodium 139 135 - 145 mmol/L   Potassium 3.7 3.5 - 5.1 mmol/L   Chloride 113 (H) 96 - 112 mmol/L   CO2 19 19 - 32 mmol/L   Glucose, Bld 88 70 - 99 mg/dL   BUN 43 (H) 6 - 23 mg/dL   Creatinine, Ser 4.61 (H) 0.50 - 1.35 mg/dL   Calcium 7.8 (L) 8.4 - 10.5 mg/dL   GFR calc non Af Amer 14 (L) >90 mL/min   GFR calc Af Amer 16 (L) >90 mL/min    Comment: (NOTE) The eGFR has been calculated using the CKD EPI equation. This calculation has not been validated in all clinical situations. eGFR's persistently <90 mL/min signify possible Chronic Kidney Disease.    Anion gap 7 5 - 15  Brain natriuretic peptide     Status: Abnormal   Collection Time: 05/05/14  6:57 AM  Result Value Ref Range   B Natriuretic Peptide 1035.0 (H) 0.0 - 100.0 pg/mL  Troponin I     Status: None   Collection  Time: 05/05/14  6:57 AM  Result Value Ref Range   Troponin I <0.03 <0.031 ng/mL    Comment:        NO INDICATION OF MYOCARDIAL INJURY.   Blood culture (routine x 2)     Status: None (Preliminary result)   Collection Time: 05/05/14  7:00 AM  Result Value Ref Range   Specimen Description Blood    Special Requests NONE    Culture NO GROWTH <24 HRS    Report Status PENDING   Blood culture (routine x 2)     Status: None (Preliminary result)   Collection Time: 05/05/14  7:00 AM  Result Value Ref Range   Specimen Description Blood    Special Requests NONE    Culture NO GROWTH <24 HRS    Report Status PENDING   CBC with Differential     Status: Abnormal   Collection Time: 05/05/14  7:30 AM  Result Value Ref Range   WBC 10.1 4.0 - 10.5 K/uL   RBC 3.78 (L) 4.22 - 5.81 MIL/uL   Hemoglobin 11.0 (L) 13.0 - 17.0 g/dL   HCT 33.9 (L) 39.0 - 52.0 %    MCV 89.7 78.0 - 100.0 fL   MCH 29.1 26.0 - 34.0 pg   MCHC 32.4 30.0 - 36.0 g/dL   RDW 14.9 11.5 - 15.5 %   Platelets 267 150 - 400 K/uL   Neutrophils Relative % 72 43 - 77 %   Neutro Abs 7.3 1.7 - 7.7 K/uL   Lymphocytes Relative 18 12 - 46 %   Lymphs Abs 1.8 0.7 - 4.0 K/uL   Monocytes Relative 6 3 - 12 %   Monocytes Absolute 0.6 0.1 - 1.0 K/uL   Eosinophils Relative 3 0 - 5 %   Eosinophils Absolute 0.3 0.0 - 0.7 K/uL   Basophils Relative 1 0 - 1 %   Basophils Absolute 0.1 0.0 - 0.1 K/uL  Urinalysis, Routine w reflex microscopic     Status: Abnormal   Collection Time: 05/05/14  9:04 AM  Result Value Ref Range   Color, Urine YELLOW YELLOW   APPearance CLEAR CLEAR   Specific Gravity, Urine 1.020 1.005 - 1.030   pH 6.5 5.0 - 8.0   Glucose, UA 100 (A) NEGATIVE mg/dL   Hgb urine dipstick LARGE (A) NEGATIVE   Bilirubin Urine NEGATIVE NEGATIVE   Ketones, ur NEGATIVE NEGATIVE mg/dL   Protein, ur >300 (A) NEGATIVE mg/dL   Urobilinogen, UA 0.2 0.0 - 1.0 mg/dL   Nitrite NEGATIVE NEGATIVE   Leukocytes, UA NEGATIVE NEGATIVE  Urine microscopic-add on     Status: None   Collection Time: 05/05/14  9:04 AM  Result Value Ref Range   Squamous Epithelial / LPF RARE RARE   RBC / HPF 7-10 <3 RBC/hpf   Bacteria, UA RARE RARE  MRSA PCR Screening     Status: None   Collection Time: 05/05/14 12:00 PM  Result Value Ref Range   MRSA by PCR NEGATIVE NEGATIVE    Comment:        The GeneXpert MRSA Assay (FDA approved for NASAL specimens only), is one component of a comprehensive MRSA colonization surveillance program. It is not intended to diagnose MRSA infection nor to guide or monitor treatment for MRSA infections.   Influenza panel by PCR (type A & B, H1N1)     Status: None   Collection Time: 05/05/14  1:15 PM  Result Value Ref Range   Influenza A By PCR NEGATIVE NEGATIVE  Influenza B By PCR NEGATIVE NEGATIVE   H1N1 flu by pcr NOT DETECTED NOT DETECTED    Comment:        The Xpert  Flu assay (FDA approved for nasal aspirates or washes and nasopharyngeal swab specimens), is intended as an aid in the diagnosis of influenza and should not be used as a sole basis for treatment.   Strep pneumoniae urinary antigen     Status: None   Collection Time: 05/05/14  2:45 PM  Result Value Ref Range   Strep Pneumo Urinary Antigen NEGATIVE NEGATIVE    Comment: Performed at [facility]        Infection due to S. pneumoniae cannot be absolutely ruled out since the antigen present may be below the detection limit of the test. Performed at [facility]   Glucose, capillary     Status: Abnormal   Collection Time: 05/05/14  4:27 PM  Result Value Ref Range   Glucose-Capillary 178 (H) 70 - 99 mg/dL   Comment 1 Notify RN   Glucose, capillary     Status: Abnormal   Collection Time: 05/05/14  9:07 PM  Result Value Ref Range   Glucose-Capillary 149 (H) 70 - 99 mg/dL   Comment 1 Notify RN   Basic metabolic panel     Status: Abnormal   Collection Time: 05/06/14  5:27 AM  Result Value Ref Range   Sodium 139 135 - 145 mmol/L   Potassium 3.8 3.5 - 5.1 mmol/L   Chloride 114 (H) 96 - 112 mmol/L   CO2 20 19 - 32 mmol/L   Glucose, Bld 106 (H) 70 - 99 mg/dL   BUN 42 (H) 6 - 23 mg/dL   Creatinine, Ser 4.77 (H) 0.50 - 1.35 mg/dL   Calcium 8.1 (L) 8.4 - 10.5 mg/dL   GFR calc non Af Amer 13 (L) >90 mL/min   GFR calc Af Amer 16 (L) >90 mL/min    Comment: (NOTE) The eGFR has been calculated using the CKD EPI equation. This calculation has not been validated in all clinical situations. eGFR's persistently <90 mL/min signify possible Chronic Kidney Disease.    Anion gap 5 5 - 15  CBC     Status: Abnormal   Collection Time: 05/06/14  5:27 AM  Result Value Ref Range   WBC 7.8 4.0 - 10.5 K/uL   RBC 3.61 (L) 4.22 - 5.81 MIL/uL   Hemoglobin 10.5 (L) 13.0 - 17.0 g/dL   HCT 32.1 (L) 39.0 - 52.0 %   MCV 88.9 78.0 - 100.0 fL   MCH 29.1 26.0 - 34.0 pg   MCHC 32.7 30.0 - 36.0  g/dL   RDW 14.8 11.5 - 15.5 %   Platelets 241 150 - 400 K/uL  Glucose, capillary     Status: Abnormal   Collection Time: 05/06/14  7:33 AM  Result Value Ref Range   Glucose-Capillary 110 (H) 70 - 99 mg/dL    Dg Chest 2 View  05/05/2014   CLINICAL DATA:  Shortness of breath and cough this morning.  EXAM: CHEST  2 VIEW  COMPARISON:  12/28/2013  FINDINGS: The previous left upper lobe consolidation has improved, minimal residual linear opacities persists likely scarring. There is new moderate patchy airspace consolidation in the right lower and right middle lobes. Cardiomediastinal contours are unchanged. Mild interstitial prominence and central bronchial thickening. Left costophrenic angle is excluded from the PA view. No large pleural effusion, no pneumothorax. No acute osseous abnormalities.  IMPRESSION: New  consolidation in the right lower and right middle lobes consistent with pneumonia. This is superimposed on bronchial thickening suspicious for bronchitis.   Electronically Signed   By: Jeb Levering M.D.   On: 05/05/2014 06:44    Review of Systems  Constitutional: Negative for fever and chills.  Respiratory: Positive for shortness of breath. Negative for cough.   Cardiovascular: Negative for orthopnea and leg swelling.  Gastrointestinal: Negative for nausea, vomiting and abdominal pain.   Blood pressure 185/101, pulse 87, temperature 97.1 F (36.2 C), temperature source Axillary, resp. rate 20, height '6\' 3"'  (1.905 m), weight 110.1 kg (242 lb 11.6 oz), SpO2 99 %. Physical Exam  Assessment/Plan: Problem #1 acute kidney injury superimposed on chronic. Presently his BUN and creatinine is higher than his baseline. Most likely prerenal syndrome / ATN/ARBS. Presently he is asymptomatic. Problem #2 chronic renal failure: Stage IV.  Taught  to be secondary to diabetic nephropathy/hypertension. 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 Since patient says that he has previous workup and has a follow-up we'll hold any additional workup.  Airlie Blumenberg S 05/06/2014, 10:43 AM

## 2014-05-07 LAB — CBC
HCT: 30.8 % — ABNORMAL LOW (ref 39.0–52.0)
Hemoglobin: 10.2 g/dL — ABNORMAL LOW (ref 13.0–17.0)
MCH: 29.3 pg (ref 26.0–34.0)
MCHC: 33.1 g/dL (ref 30.0–36.0)
MCV: 88.5 fL (ref 78.0–100.0)
Platelets: 251 10*3/uL (ref 150–400)
RBC: 3.48 MIL/uL — ABNORMAL LOW (ref 4.22–5.81)
RDW: 14.9 % (ref 11.5–15.5)
WBC: 8.4 10*3/uL (ref 4.0–10.5)

## 2014-05-07 LAB — GLUCOSE, CAPILLARY
GLUCOSE-CAPILLARY: 95 mg/dL (ref 70–99)
Glucose-Capillary: 194 mg/dL — ABNORMAL HIGH (ref 70–99)

## 2014-05-07 LAB — BASIC METABOLIC PANEL
Anion gap: 5 (ref 5–15)
BUN: 45 mg/dL — AB (ref 6–23)
CO2: 19 mmol/L (ref 19–32)
Calcium: 7.8 mg/dL — ABNORMAL LOW (ref 8.4–10.5)
Chloride: 115 mmol/L — ABNORMAL HIGH (ref 96–112)
Creatinine, Ser: 4.98 mg/dL — ABNORMAL HIGH (ref 0.50–1.35)
GFR calc Af Amer: 15 mL/min — ABNORMAL LOW (ref >90)
GFR calc non Af Amer: 13 mL/min — ABNORMAL LOW (ref >90)
Glucose, Bld: 113 mg/dL — ABNORMAL HIGH (ref 70–99)
Potassium: 3.5 mmol/L (ref 3.5–5.1)
SODIUM: 139 mmol/L (ref 135–145)

## 2014-05-07 LAB — PHOSPHORUS: PHOSPHORUS: 4.3 mg/dL (ref 2.3–4.6)

## 2014-05-07 MED ORDER — SODIUM BICARBONATE 650 MG PO TABS: 650.0000 mg | ORAL_TABLET | Freq: Three times a day (TID) | ORAL | Status: DC

## 2014-05-07 NOTE — Progress Notes (Signed)
Lance [name]  MRN: [id number]  DOB/AGE: [birth year] 44 y.o.  Primary Care [facility] S, MD  Admit date: 05/05/2014  Chief Complaint:  Chief Complaint  Patient presents with  . Shortness of Breath    S-Pt presented on  05/05/2014 with  Chief Complaint  Patient presents with  . Shortness of Breath  .    Pt today feels better    Pt offers no new complaints.    Pt main question is " when can I go home"  Meds . amLODipine  10 mg Oral Daily  . azithromycin  500 mg Intravenous Q24H  . carvedilol  50 mg Oral BID  . cefTRIAXone (ROCEPHIN)  IV  1 g Intravenous Q24H  . furosemide  80 mg Intravenous BID  . guaiFENesin  1,200 mg Oral BID  . heparin  5,000 Units Subcutaneous 3 times per day  . hydrALAZINE  25 mg Oral 3 times per day  . insulin aspart  0-15 Units Subcutaneous TID WC  . insulin aspart  10 Units Subcutaneous TID AC  . insulin detemir  30 Units Subcutaneous QHS  . ipratropium-albuterol  3 mL Nebulization BID       Physical Exam: Vital signs in last 24 hours: Temp:  [97.9 F (36.6 C)-98.4 F (36.9 C)] 98.4 F (36.9 C) (03/26 0535) Pulse Rate:  [90-103] 90 (03/26 0542) Resp:  [20] 20 (03/26 0535) BP: (143-156)/(72-87) 143/82 mmHg (03/26 0542) SpO2:  [94 %-99 %] 94 % (03/26 0736) Weight change:  Last BM Date: 05/06/14  Intake/Output from previous day: 03/25 0701 - 03/26 0700 In: 2013.3 [I.V.:1713.3; IV Piggyback:300] Out: 2200 [Urine:2200] Total I/O In: 240 [P.O.:240] Out: 1000 [Urine:1000]   Physical Exam: General- pt is awake,alert, oriented to time place and person Resp- No acute REsp distress, Rhonchi+ CVS- S1S2 regular in rate and rhythm GIT- BS+, soft, NT, ND EXT- NO LE Edema, Cyanosis   Lab Results: CBC  Recent Labs  05/06/14 0527 05/07/14 0603  WBC 7.8 8.4  HGB 10.5* 10.2*  HCT 32.1* 30.8*  PLT 241 251    BMET  Recent Labs  05/06/14 0527 05/07/14 0603  NA 139 139  K 3.8 3.5  CL 114* 115*  CO2 20 19  GLUCOSE  106* 113*  BUN 42* 45*  CREATININE 4.77* 4.98*  CALCIUM 8.1* 7.8*   Creat trend 2016 4.66=> 4.77=>4.98 2015 3.3--5.3 2014 1.9--2.1 2013  1.9     MICRO Recent Results (from the past 240 hour(s))  Blood culture (routine x 2)     Status: None (Preliminary result)   Collection Time: 05/05/14  7:00 AM  Result Value Ref Range Status   Specimen Description BLOOD RIGHT ARM  Final   Special Requests BOTTLES DRAWN AEROBIC AND ANAEROBIC 10CC  Final   Culture NO GROWTH 2 DAYS  Final   Report Status PENDING  Incomplete  MRSA PCR Screening     Status: None   Collection Time: 05/05/14 12:00 PM  Result Value Ref Range Status   MRSA by PCR NEGATIVE NEGATIVE Final    Comment:        The GeneXpert MRSA Assay (FDA approved for NASAL specimens only), is one component of a comprehensive MRSA colonization surveillance program. It is not intended to diagnose MRSA infection nor to guide or monitor treatment for MRSA infections.   Blood culture (routine x 2)     Status: None (Preliminary result)   Collection Time: 05/06/14  5:27 AM  Result Value Ref Range Status  Specimen Description BLOOD LEFT HAND  Final   Special Requests BOTTLES DRAWN AEROBIC AND ANAEROBIC 6CC  Final   Culture NO GROWTH 2 DAYS  Final   Report Status PENDING  Incomplete      Lab Results  Component Value Date   CALCIUM 7.8* 05/07/2014   PHOS 4.3 05/07/2014               Impression: 1)Renal  AKI secondary to Prerenal/ATN               AKI sec to ARB               AKI near plateau most likely.               AKI on CKD               CKD stage 4.               CKD since 2013               CKD secondary to DM/HTN                Progression of CKD marked with multiple AKI                Nephrotic range Proteinuria Present  2)HTN  Medication- On Diuretics On Calcium Channel Blockers On Alpha and beta Blockers   3)Anemia HGb at goal (9--11)  4)CKD Mineral-Bone Disorder Phosphorus at  goal. Calcium when corrected for low albumin isat goal.  5)ID-admitted with Pneumonia Primary MD following  6)Electrolytes Normokalemic NOrmonatremic   7)Acid base Co2 not at goal     Plan:  Will start Po bicarb will continue current tx Will follow bmet. Pt says he already has follow up with  nephrologist on 05/16/14.     Jordanne Elsbury S 05/07/2014, 12:26 PM

## 2014-05-07 NOTE — Progress Notes (Signed)
Patient left AMA risk was explained to him, MD talked to him also patient still wants to leave. Patient signed AMA paperwork and left floor walking.  [name] [employer]

## 2014-05-07 NOTE — Discharge Summary (Signed)
Physician Discharge Summary  Lance [name] [id number] DOB: [birth year] DOA: 05/05/2014  PCP: [name], MD  Admit date: 05/05/2014 Discharge date: 05/07/2014  Time spent: 30 minutes  [place]  Discharge Diagnoses:  Principal Problem:   CAP (community acquired pneumonia) Active Problems:   Diabetes mellitus type 1   Hyperlipidemia   Chronic kidney disease, stage III (moderate)   Chronic diastolic heart failure   Acute renal failure   Hypertensive urgency    Filed Weights   05/05/14 0636 05/05/14 1410 05/06/14 0500  Weight: 108.863 kg (240 lb) 108 kg (238 lb 1.6 oz) 110.1 kg (242 lb 11.6 oz)    History of present illness:  The patient presented to the hospital with shortness of breath. He was found to be markedly hypertensive with systolic pressure greater than 200. Chest x-ray indicated possible developing pneumonia. He was admitted for further treatments  Hospital Course:  Patient was started on intravenous antibiotics. He quickly improved and was breathing comfortably on room air following day. He also received nebulizer treatments. He is not febrile and has normal WBC count. Blood pressures better controlled with the addition of hydralazine. Losartan was discontinued due to worsening creatinine. Patient's baseline creatinine is between 3 and 3.5. During his admission, he presented with a creatinine of 4.7 which subsequently increased to 4.9. He was seen by nephrology who recommended continued IV fluids. It was recommended the patient stay in the hospital until his creatinine began to improve. He was insistent on leaving [place]. He was explained the risks of leaving the hospital at this time including worsening renal function and death. The patient signed himself out of the hospital.  Consultations:  nephrology   Allergies  Allergen Reactions  . Atorvastatin Other (See Comments)    Myalgias  . Minoxidil Other (See  Comments)    Fluid retention      The results of significant diagnostics from this hospitalization (including imaging, microbiology, ancillary and laboratory) are listed below for reference.    Significant Diagnostic Studies: Dg Chest 2 View  05/05/2014   CLINICAL DATA:  Shortness of breath and cough this morning.  EXAM: CHEST  2 VIEW  COMPARISON:  12/28/2013  FINDINGS: The previous left upper lobe consolidation has improved, minimal residual linear opacities persists likely scarring. There is new moderate patchy airspace consolidation in the right lower and right middle lobes. Cardiomediastinal contours are unchanged. Mild interstitial prominence and central bronchial thickening. Left costophrenic angle is excluded from the PA view. No large pleural effusion, no pneumothorax. No acute osseous abnormalities.  IMPRESSION: New consolidation in the right lower and right middle lobes consistent with pneumonia. This is superimposed on bronchial thickening suspicious for bronchitis.   Electronically Signed   By: Jeb Levering M.D.   On: 05/05/2014 06:44    Microbiology: Recent Results (from the past 240 hour(s))  Blood culture (routine x 2)     Status: None (Preliminary result)   Collection Time: 05/05/14  7:00 AM  Result Value Ref Range Status   Specimen Description BLOOD RIGHT ARM  Final   Special Requests BOTTLES DRAWN AEROBIC AND ANAEROBIC 10CC  Final   Culture NO GROWTH 2 DAYS  Final   Report Status PENDING  Incomplete  MRSA PCR Screening     Status: None   Collection Time: 05/05/14 12:00 PM  Result Value Ref Range Status   MRSA by PCR NEGATIVE NEGATIVE Final    Comment:  The GeneXpert MRSA Assay (FDA approved for NASAL specimens only), is one component of a comprehensive MRSA colonization surveillance program. It is not intended to diagnose MRSA infection nor to guide or monitor treatment for MRSA infections.   Blood culture (routine x 2)     Status: None (Preliminary  result)   Collection Time: 05/06/14  5:27 AM  Result Value Ref Range Status   Specimen Description BLOOD LEFT HAND  Final   Special Requests BOTTLES DRAWN AEROBIC AND ANAEROBIC 6CC  Final   Culture NO GROWTH 2 DAYS  Final   Report Status PENDING  Incomplete     Labs: Basic Metabolic Panel:  Recent Labs Lab 05/05/14 0657 05/06/14 0527 05/07/14 0603  NA 139 139 139  K 3.7 3.8 3.5  CL 113* 114* 115*  CO2 19 20 19   GLUCOSE 88 106* 113*  BUN 43* 42* 45*  CREATININE 4.61* 4.77* 4.98*  CALCIUM 7.8* 8.1* 7.8*  PHOS  --   --  4.3   Liver Function Tests: No results for input(s): AST, ALT, ALKPHOS, BILITOT, PROT, ALBUMIN in the last 168 hours. No results for input(s): LIPASE, AMYLASE in the last 168 hours. No results for input(s): AMMONIA in the last 168 hours. CBC:  Recent Labs Lab 05/05/14 0730 05/06/14 0527 05/07/14 0603  WBC 10.1 7.8 8.4  NEUTROABS 7.3  --   --   HGB 11.0* 10.5* 10.2*  HCT 33.9* 32.1* 30.8*  MCV 89.7 88.9 88.5  PLT 267 241 251   Cardiac Enzymes:  Recent Labs Lab 05/05/14 0657  TROPONINI <0.03   BNP: BNP (last 3 results)  Recent Labs  05/05/14 0657  BNP 1035.0*    ProBNP (last 3 results)  Recent Labs  12/23/13 1338  PROBNP 6963.0*    CBG:  Recent Labs Lab 05/06/14 1121 05/06/14 1703 05/06/14 2101 05/07/14 0725 05/07/14 1141  GLUCAP 129* 111* 136* 95 194*       Signed:  Serapio Edelson  [facility] 05/07/2014, 7:17 PM

## 2014-05-14 LAB — CULTURE, BLOOD (ROUTINE X 2)
Culture: NO GROWTH
Culture: NO GROWTH

## 2014-05-26 ENCOUNTER — Other Ambulatory Visit: Payer: Self-pay

## 2014-05-26 DIAGNOSIS — N183 Chronic kidney disease, stage 3 unspecified: Secondary | ICD-10-CM

## 2014-05-26 DIAGNOSIS — Z01810 Encounter for preprocedural cardiovascular examination: Secondary | ICD-10-CM

## 2014-06-02 ENCOUNTER — Encounter: Payer: Self-pay | Admitting: Vascular Surgery

## 2014-06-03 ENCOUNTER — Ambulatory Visit (HOSPITAL_COMMUNITY)
Admission: RE | Admit: 2014-06-03 | Discharge: 2014-06-03 | Disposition: A | Discharge status: Home / Self Care | Payer: BLUE CROSS/BLUE SHIELD | Source: Ambulatory Visit | Attending: Vascular Surgery | Admitting: Vascular Surgery

## 2014-06-03 ENCOUNTER — Ambulatory Visit (INDEPENDENT_AMBULATORY_CARE_PROVIDER_SITE_OTHER)
Admission: RE | Admit: 2014-06-03 | Discharge: 2014-06-03 | Disposition: A | Discharge status: Home / Self Care | Payer: BLUE CROSS/BLUE SHIELD | Source: Ambulatory Visit | Attending: Vascular Surgery | Admitting: Vascular Surgery

## 2014-06-03 ENCOUNTER — Ambulatory Visit (INDEPENDENT_AMBULATORY_CARE_PROVIDER_SITE_OTHER): Payer: BLUE CROSS/BLUE SHIELD | Admitting: Vascular Surgery

## 2014-06-03 ENCOUNTER — Encounter: Payer: Self-pay | Admitting: Vascular Surgery

## 2014-06-03 VITALS — BP 196/100 | HR 84 | Temp 98.0°F | Resp 16 | Ht 75.0 in | Wt 231.0 lb

## 2014-06-03 DIAGNOSIS — N183 Chronic kidney disease, stage 3 unspecified: Secondary | ICD-10-CM

## 2014-06-03 DIAGNOSIS — Z01810 Encounter for preprocedural cardiovascular examination: Secondary | ICD-10-CM | POA: Insufficient documentation

## 2014-06-03 NOTE — Progress Notes (Signed)
[facility] [place]  Patient name: TRAFTON [name] MRN: [id number] DOB: 22-May-1970 Sex: male  REASON FOR CONSULT: Evaluate for hemodialysis access  HPI: Lance [name] is a 44 y.o. male with stage III chronic kidney disease secondary to diabetes and hypertension. He is not yet on dialysis. He denies any recent uremic symptoms. Specifically he denies nausea, vomiting, fatigue, anorexia, or palpitations. He is right-handed.   Past Medical History  Diagnosis Date  . Type 1 diabetes mellitus 1990  . Essential hypertension, benign 2000    LVH  . Hyperlipidemia 2000  . Aortic stenosis     Very mild in 05/2007  . PSVT (paroxysmal supraventricular tachycardia)     Nonsustained; asymptomatic; diagnosed by event recorder in 2006  . Degenerative joint disease     Left TKA  . Tobacco abuse, in remission     20 pack years; discontinued in 1985; bronchitic changes on chest x-ray and 2009  . Diastolic heart failure     LVEF 65-70% with grade 2 diastolic dysfunction  . CKD (chronic kidney disease) stage 3, GFR 30-59 ml/min    Family History  Problem Relation Age of Onset  . Diabetes Father   . Hypertension Father   . Diabetes Mother   . Hypertension Mother    SOCIAL HISTORY: History  Substance Use Topics  . Smoking status: Never Smoker   . Smokeless tobacco: Not on file  . Alcohol Use: No   Allergies  Allergen Reactions  . Atorvastatin Other (See Comments)    Myalgias  . Minoxidil Other (See Comments)    Fluid retention   Current Outpatient Prescriptions  Medication Sig Dispense Refill  . amLODipine (NORVASC) 10 MG tablet Take 10 mg by mouth daily.    . benzonatate (TESSALON) 100 MG capsule Take 1 capsule (100 mg total) by mouth 3 (three) times daily as needed for cough. 20 capsule 0  . carvedilol (COREG) 25 MG tablet TAKE 2 TABLETS BY MOUTH TWICE A DAY 120 tablet 3  . insulin aspart (NOVOLOG) 100 UNIT/ML injection Inject 10 Units into the skin 3  (three) times daily before meals. 10 mL 11  . insulin detemir (LEVEMIR) 100 UNIT/ML injection Inject 0.2 mLs (20 Units total) into the skin at bedtime. (Patient taking differently: Inject 30 Units into the skin at bedtime. ) 10 mL 11  . Multiple Vitamin (MULTIVITAMIN) capsule Take 1 capsule by mouth daily.    . potassium chloride SA (K-DUR,KLOR-CON) 20 MEQ tablet Take 1 tablet (20 mEq total) by mouth daily. 30 tablet 6  . torsemide (DEMADEX) 20 MG tablet Take 3 tablets (60 mg total) by mouth daily. 150 tablet 6  . losartan (COZAAR) 100 MG tablet Take 1 tablet (100 mg total) by mouth daily. (Patient not taking: Reported on 06/03/2014) 30 tablet 0   No current facility-administered medications for this visit.   REVIEW OF SYSTEMS: Valu.Nieves ] denotes positive finding; [  ] denotes negative finding  CARDIOVASCULAR:  [ ]  chest pain   [ ]  chest pressure   [ ]  palpitations   [ ]  orthopnea   [ ]  dyspnea on exertion   [ ]  claudication   [ ]  rest pain   [ ]  DVT   [ ]  phlebitis PULMONARY:   [ ]  productive cough   [ ]  asthma   [ ]  wheezing NEUROLOGIC:   [ ]  weakness  [ ]  paresthesias  [ ]  aphasia  [ ]  amaurosis  [ ]  dizziness HEMATOLOGIC:   [ ]   bleeding problems   [ ]  clotting disorders MUSCULOSKELETAL:  [ ]  joint pain   [ ]  joint swelling [ ]  leg swelling GASTROINTESTINAL: [ ]   blood in stool  [ ]   hematemesis GENITOURINARY:  [ ]   dysuria  [ ]   hematuria PSYCHIATRIC:  [ ]  history of major depression INTEGUMENTARY:  [ ]  rashes  [ ]  ulcers CONSTITUTIONAL:  [ ]  fever   [ ]  chills  PHYSICAL EXAM: Filed Vitals:   06/03/14 1511  BP: 196/100  Pulse: 84  Temp: 98 F (36.7 C)  TempSrc: Oral  Resp: 16  Height: 6\' 3"  (1.905 m)  Weight: 231 lb (104.781 kg)  SpO2: 99%   GENERAL: The patient is a well-nourished male, in no acute distress. The vital signs are documented above. CARDIOVASCULAR: There is a regular rate and rhythm. I do not detect carotid bruits. He has palpable radial and brachial pulses  bilaterally. PULMONARY: There is good air exchange bilaterally without wheezing or rales. ABDOMEN: Soft and non-tender with normal pitched bowel sounds.  MUSCULOSKELETAL: There are no major deformities or cyanosis. NEUROLOGIC: No focal weakness or paresthesias are detected. SKIN: he has a branching in his left upper arm which I think could potentially compromise the cephalic vein in the upper arm on the left. PSYCHIATRIC: The patient has a normal affect.  DATA:  I have independently interpreted his vein mapping. His forearm cephalic vein looks reasonable on the left although the upper arm cephalic vein has an area that is fairly small. This could potentially be related to where he has the bran being on his upper arm. tthe forearm and upper arm cephalic vein on the right appear to be reasonable size.  I have independently interpreted his arterial Doppler study which shows triphasic Doppler signals in the radial and ulnar positions bilaterally.  MEDICAL ISSUES:  STAGE III CHRONIC KIDNEY DISEASE: I have recommended placement of an AV fistula in the right arm. Although he is right-handed and concerned that the branding on the left upper arm could've compromises upper arm cephalic vein. He is not yet on dialysis and therefore I would not recommend placement of a graft at this time but he appears to be a reasonable candidate for a fistula. I have explained the indications for placement of an AV fistula or AV graft. I've explained that if at all possible we will place an AV fistula.  I have reviewed the risks of placement of an AV fistula including but not limited to: failure of the fistula to mature, need for subsequent interventions, and thrombosis.  All the patient's questions were answered and they are agreeable to proceed with surgery. The surgery has been scheduled for 06/23/2014.  [facility] [place] Beeper: [phone]

## 2014-06-06 ENCOUNTER — Other Ambulatory Visit: Payer: Self-pay | Admitting: Cardiology

## 2014-06-06 ENCOUNTER — Other Ambulatory Visit: Payer: Self-pay

## 2014-06-06 MED ORDER — AMLODIPINE BESYLATE 10 MG PO TABS: 10.0000 mg | ORAL_TABLET | Freq: Every day | ORAL | Status: DC

## 2014-06-06 NOTE — Telephone Encounter (Signed)
Received fax refill request  Rx # [id number] Medication:  Amlodipine Besylate 10 mg tab Qty 90 Sig:  Take one tablet by mouth every day Physician:  Harl Bowie

## 2014-06-22 ENCOUNTER — Encounter (HOSPITAL_COMMUNITY): Payer: Self-pay | Admitting: *Deleted

## 2014-06-22 MED ORDER — SODIUM CHLORIDE 0.9 % IV SOLN
INTRAVENOUS | Status: DC
  Administered 2014-06-23 (×2): via INTRAVENOUS

## 2014-06-22 MED ORDER — DEXTROSE 5 % IV SOLN
1.5000 g | INTRAVENOUS | Status: AC
  Administered 2014-06-23: 1.5 g via INTRAVENOUS
  Filled 2014-06-22: qty 1.5

## 2014-06-22 MED ORDER — CHLORHEXIDINE GLUCONATE CLOTH 2 % EX PADS: 6.0000 | MEDICATED_PAD | Freq: Once | CUTANEOUS | Status: DC

## 2014-06-22 NOTE — Progress Notes (Signed)
Pt denies SOB and chest pain. Pt denies having a cardiac cath. Pt stated that he was instructed to take half of his insulin tonight and noon insulin the morning of procedure. Pt instructed to stop otc vitamins, herbal medications and NSAID's. Pt verbalized understanding of all pre-op instructions.

## 2014-06-22 NOTE — Progress Notes (Signed)
Anesthesia Chart Review:  Pt is 44 year old male scheduled for R AV fistula creation on 06/23/2014 with Dr. Scot Dock.   Pt is same day work up.   PMH includes: HTN, PSVT (nonsustained, asymptomatic, dx by event recorder in [id number]), diastolic HF, DM type 1, CKD (stage 3, not yet on dialysis), hyperlipidemia. Former smoker. BMI 29.  Medications include: amlodipine, carvedilol, insulin, losartan, demadex.   BP at [facility] on 06/03/2014 was 196/100.   Pt hospitalized 3/24-3/26/2016 for hypertensive urgency (systolic BP greater than [id number]) and found to have CAP. Hospitalization complicated by acute renal failure. Pt left hospital AMA.   Pt hospitalized 11/12-11/18/2015 for DKA. Had positive troponin but no chest pain or EKG changes. Cardiology consulted, thought to be demand ischemia. Pt not a candidate for cardiac cath due to CKD. Pt refused chemical stress test. An exercise tolerance test was scheduled for 02/08/2014, but pt did not keep this appointment.   Echo 12/25/2013: - Left ventricle: The cavity size was normal. Wall thickness wasincreased in a pattern of mild LVH. Systolic function was normal.The estimated ejection fraction was in the range of 60% to 65%.Wall motion was normal; there were no regional wall motionabnormalities. - Mitral valve: There was mild regurgitation. - Left atrium: The atrium was mildly dilated. - Structurally normal aortic valve.  Case reviewed with Dr. Conrad McCoy.   If no changes and pt asymptomatic for CV symptoms DOS, I anticipate pt can proceed with surgery as scheduled.   Willeen Cass, FNP-BC [facility]/Anesthesiology Phone: [phone] 06/22/2014 4:14 PM

## 2014-06-23 ENCOUNTER — Ambulatory Visit (HOSPITAL_COMMUNITY)
Admission: RE | Admit: 2014-06-23 | Discharge: 2014-06-23 | Disposition: A | Discharge status: Home / Self Care | Payer: BLUE CROSS/BLUE SHIELD | Source: Ambulatory Visit | Attending: Vascular Surgery | Admitting: Vascular Surgery

## 2014-06-23 ENCOUNTER — Other Ambulatory Visit: Payer: Self-pay

## 2014-06-23 ENCOUNTER — Ambulatory Visit (HOSPITAL_COMMUNITY): Payer: BLUE CROSS/BLUE SHIELD | Admitting: Vascular Surgery

## 2014-06-23 ENCOUNTER — Encounter (HOSPITAL_COMMUNITY): Payer: Self-pay | Admitting: *Deleted

## 2014-06-23 ENCOUNTER — Encounter (HOSPITAL_COMMUNITY)
Admission: RE | Disposition: A | Discharge status: Home / Self Care | Payer: Self-pay | Source: Ambulatory Visit | Attending: Vascular Surgery

## 2014-06-23 DIAGNOSIS — Z96652 Presence of left artificial knee joint: Secondary | ICD-10-CM | POA: Insufficient documentation

## 2014-06-23 DIAGNOSIS — I120 Hypertensive chronic kidney disease with stage 5 chronic kidney disease or end stage renal disease: Secondary | ICD-10-CM | POA: Diagnosis not present

## 2014-06-23 DIAGNOSIS — Z87891 Personal history of nicotine dependence: Secondary | ICD-10-CM | POA: Insufficient documentation

## 2014-06-23 DIAGNOSIS — Z794 Long term (current) use of insulin: Secondary | ICD-10-CM | POA: Insufficient documentation

## 2014-06-23 DIAGNOSIS — Z48812 Encounter for surgical aftercare following surgery on the circulatory system: Secondary | ICD-10-CM

## 2014-06-23 DIAGNOSIS — I5032 Chronic diastolic (congestive) heart failure: Secondary | ICD-10-CM | POA: Diagnosis not present

## 2014-06-23 DIAGNOSIS — M1990 Unspecified osteoarthritis, unspecified site: Secondary | ICD-10-CM | POA: Insufficient documentation

## 2014-06-23 DIAGNOSIS — N186 End stage renal disease: Secondary | ICD-10-CM | POA: Diagnosis not present

## 2014-06-23 DIAGNOSIS — E785 Hyperlipidemia, unspecified: Secondary | ICD-10-CM | POA: Insufficient documentation

## 2014-06-23 DIAGNOSIS — E1022 Type 1 diabetes mellitus with diabetic chronic kidney disease: Secondary | ICD-10-CM | POA: Diagnosis not present

## 2014-06-23 HISTORY — DX: Pneumonia, unspecified organism: J18.9

## 2014-06-23 HISTORY — PX: AV FISTULA PLACEMENT: SHX1204

## 2014-06-23 LAB — GLUCOSE, CAPILLARY
GLUCOSE-CAPILLARY: 381 mg/dL — AB (ref 65–99)
GLUCOSE-CAPILLARY: 163 mg/dL — AB (ref 65–99)
Glucose-Capillary: 359 mg/dL — ABNORMAL HIGH (ref 65–99)
Glucose-Capillary: 221 mg/dL — ABNORMAL HIGH (ref 65–99)

## 2014-06-23 SURGERY — ARTERIOVENOUS (AV) FISTULA CREATION
Anesthesia: Monitor Anesthesia Care | Site: Arm Upper | Laterality: Right

## 2014-06-23 MED ORDER — HYDROMORPHONE HCL 1 MG/ML IJ SOLN: 0.2500 mg | INTRAMUSCULAR | Status: DC | PRN

## 2014-06-23 MED ORDER — THROMBIN 20000 UNITS EX SOLR
CUTANEOUS | Status: AC
  Filled 2014-06-23: qty 20000

## 2014-06-23 MED ORDER — PROTAMINE SULFATE 10 MG/ML IV SOLN
INTRAVENOUS | Status: DC | PRN
  Administered 2014-06-23 (×4): 10 mg via INTRAVENOUS

## 2014-06-23 MED ORDER — LIDOCAINE HCL (PF) 1 % IJ SOLN
INTRAMUSCULAR | Status: AC
  Filled 2014-06-23: qty 30

## 2014-06-23 MED ORDER — 0.9 % SODIUM CHLORIDE (POUR BTL) OPTIME
TOPICAL | Status: DC | PRN
  Administered 2014-06-23: 1000 mL

## 2014-06-23 MED ORDER — FENTANYL CITRATE (PF) 100 MCG/2ML IJ SOLN
INTRAMUSCULAR | Status: DC | PRN
  Administered 2014-06-23 (×2): 50 ug via INTRAVENOUS

## 2014-06-23 MED ORDER — LIDOCAINE HCL (PF) 1 % IJ SOLN
INTRAMUSCULAR | Status: DC | PRN
  Administered 2014-06-23: 18 mL

## 2014-06-23 MED ORDER — FENTANYL CITRATE (PF) 250 MCG/5ML IJ SOLN
INTRAMUSCULAR | Status: AC
  Filled 2014-06-23: qty 5

## 2014-06-23 MED ORDER — PROPOFOL INFUSION 10 MG/ML OPTIME
INTRAVENOUS | Status: DC | PRN
  Administered 2014-06-23: 25 ug/kg/min via INTRAVENOUS

## 2014-06-23 MED ORDER — LIDOCAINE-EPINEPHRINE (PF) 1 %-1:200000 IJ SOLN
INTRAMUSCULAR | Status: AC
  Filled 2014-06-23: qty 10

## 2014-06-23 MED ORDER — HEPARIN SODIUM (PORCINE) 1000 UNIT/ML IJ SOLN
INTRAMUSCULAR | Status: DC | PRN
  Administered 2014-06-23: 7000 [IU] via INTRAVENOUS

## 2014-06-23 MED ORDER — DEXTROSE 5 % IV SOLN
INTRAVENOUS | Status: DC | PRN
  Administered 2014-06-23: 11:00:00 via INTRAVENOUS

## 2014-06-23 MED ORDER — MIDAZOLAM HCL 5 MG/5ML IJ SOLN
INTRAMUSCULAR | Status: DC | PRN
  Administered 2014-06-23 (×2): 1 mg via INTRAVENOUS

## 2014-06-23 MED ORDER — INSULIN ASPART 100 UNIT/ML ~~LOC~~ SOLN
10.0000 [IU] | Freq: Once | SUBCUTANEOUS | Status: AC
  Administered 2014-06-23: 10 [IU] via SUBCUTANEOUS

## 2014-06-23 MED ORDER — OXYCODONE HCL 5 MG PO TABS
ORAL_TABLET | ORAL | Status: DC
Start: 2014-06-23 — End: 2014-06-23
  Filled 2014-06-23: qty 1

## 2014-06-23 MED ORDER — ONDANSETRON HCL 4 MG/2ML IJ SOLN
INTRAMUSCULAR | Status: DC | PRN
  Administered 2014-06-23: 4 mg via INTRAVENOUS

## 2014-06-23 MED ORDER — SODIUM CHLORIDE 0.9 % IR SOLN
Status: DC | PRN
  Administered 2014-06-23: 11:00:00

## 2014-06-23 MED ORDER — OXYCODONE HCL 5 MG PO TABS: 5.0000 mg | ORAL_TABLET | Freq: Four times a day (QID) | ORAL | Status: DC | PRN

## 2014-06-23 MED ORDER — HEPARIN SODIUM (PORCINE) 1000 UNIT/ML IJ SOLN
INTRAMUSCULAR | Status: AC
  Filled 2014-06-23: qty 1

## 2014-06-23 MED ORDER — PAPAVERINE HCL 30 MG/ML IJ SOLN
INTRAMUSCULAR | Status: AC
  Filled 2014-06-23: qty 2

## 2014-06-23 MED ORDER — INSULIN ASPART 100 UNIT/ML ~~LOC~~ SOLN
SUBCUTANEOUS | Status: AC
  Filled 2014-06-23: qty 1

## 2014-06-23 MED ORDER — MIDAZOLAM HCL 2 MG/2ML IJ SOLN
INTRAMUSCULAR | Status: AC
  Filled 2014-06-23: qty 2

## 2014-06-23 MED ORDER — OXYCODONE HCL 5 MG PO TABS
5.0000 mg | ORAL_TABLET | Freq: Once | ORAL | Status: AC
  Administered 2014-06-23: 5 mg via ORAL

## 2014-06-23 MED ORDER — PROPOFOL 10 MG/ML IV BOLUS
INTRAVENOUS | Status: AC
  Filled 2014-06-23: qty 20

## 2014-06-23 MED ORDER — ONDANSETRON HCL 4 MG/2ML IJ SOLN
INTRAMUSCULAR | Status: AC
  Filled 2014-06-23: qty 2

## 2014-06-23 MED ORDER — PROTAMINE SULFATE 10 MG/ML IV SOLN
INTRAVENOUS | Status: AC
  Filled 2014-06-23: qty 5

## 2014-06-23 SURGICAL SUPPLY — 35 items
ARMBAND PINK RESTRICT EXTREMIT (MISCELLANEOUS) ×3 IMPLANT
CANISTER SUCTION 2500CC (MISCELLANEOUS) ×3 IMPLANT
CANNULA VESSEL 3MM 2 BLNT TIP (CANNULA) ×6 IMPLANT
CLIP TI MEDIUM 6 (CLIP) ×3 IMPLANT
CLIP TI WIDE RED SMALL 6 (CLIP) ×6 IMPLANT
DECANTER SPIKE VIAL GLASS SM (MISCELLANEOUS) ×3 IMPLANT
ELECT REM PT RETURN 9FT ADLT (ELECTROSURGICAL) ×3
ELECTRODE REM PT RTRN 9FT ADLT (ELECTROSURGICAL) ×1 IMPLANT
GLOVE BIO SURGEON STRL SZ 6.5 (GLOVE) ×4 IMPLANT
GLOVE BIO SURGEON STRL SZ7.5 (GLOVE) ×3 IMPLANT
GLOVE BIO SURGEONS STRL SZ 6.5 (GLOVE) ×2
GLOVE BIOGEL PI IND STRL 6.5 (GLOVE) ×1 IMPLANT
GLOVE BIOGEL PI IND STRL 7.0 (GLOVE) ×2 IMPLANT
GLOVE BIOGEL PI IND STRL 8 (GLOVE) ×1 IMPLANT
GLOVE BIOGEL PI INDICATOR 6.5 (GLOVE) ×2
GLOVE BIOGEL PI INDICATOR 7.0 (GLOVE) ×4
GLOVE BIOGEL PI INDICATOR 8 (GLOVE) ×2
GLOVE ECLIPSE 6.5 STRL STRAW (GLOVE) ×3 IMPLANT
GOWN STRL REUS W/ TWL LRG LVL3 (GOWN DISPOSABLE) ×3 IMPLANT
GOWN STRL REUS W/TWL LRG LVL3 (GOWN DISPOSABLE) ×6
KIT BASIN OR (CUSTOM PROCEDURE TRAY) ×3 IMPLANT
KIT ROOM TURNOVER OR (KITS) ×3 IMPLANT
LIQUID BAND (GAUZE/BANDAGES/DRESSINGS) ×3 IMPLANT
NS IRRIG 1000ML POUR BTL (IV SOLUTION) ×3 IMPLANT
PACK CV ACCESS (CUSTOM PROCEDURE TRAY) ×3 IMPLANT
PAD ARMBOARD 7.5X6 YLW CONV (MISCELLANEOUS) ×6 IMPLANT
SPONGE SURGIFOAM ABS GEL 100 (HEMOSTASIS) IMPLANT
SUT PROLENE 6 0 BV (SUTURE) ×3 IMPLANT
SUT VIC AB 3-0 SH 27 (SUTURE) ×2
SUT VIC AB 3-0 SH 27X BRD (SUTURE) ×1 IMPLANT
SUT VICRYL 4-0 PS2 18IN ABS (SUTURE) ×3 IMPLANT
SYR 20CC LL (SYRINGE) ×3 IMPLANT
TOWEL OR 17X24 6PK STRL BLUE (TOWEL DISPOSABLE) ×3 IMPLANT
UNDERPAD 30X30 INCONTINENT (UNDERPADS AND DIAPERS) ×3 IMPLANT
WATER STERILE IRR 1000ML POUR (IV SOLUTION) ×3 IMPLANT

## 2014-06-23 NOTE — Progress Notes (Signed)
Patient had labs drawn at [place] office yesterday, per Dr. Ola Spurr the labs from [place] office is okay to use for preop labs.  [place] and requested labs to fax to surgical short stay.

## 2014-06-23 NOTE — Anesthesia Preprocedure Evaluation (Addendum)
Anesthesia Evaluation  Patient identified by MRN, date of birth, ID band Patient awake    Reviewed: Allergy & Precautions, H&P , NPO status , Patient's Chart, lab work & pertinent test results, reviewed documented beta blocker date and time   Airway Mallampati: II  TM Distance: >3 FB Neck ROM: Full    Dental no notable dental hx. (+) Teeth Intact, Dental Advisory Given   Pulmonary neg pulmonary ROS,  breath sounds clear to auscultation  Pulmonary exam normal       Cardiovascular hypertension, Pt. on medications and Pt. on home beta blockers Rhythm:Regular Rate:Normal     Neuro/Psych negative neurological ROS  negative psych ROS   GI/Hepatic negative GI ROS, Neg liver ROS,   Endo/Other  diabetes, Poorly Controlled, Type 1, Insulin Dependent  Renal/GU CRFRenal disease  negative genitourinary   Musculoskeletal  (+) Arthritis -, Osteoarthritis,    Abdominal   Peds  Hematology negative hematology ROS (+)   Anesthesia Other Findings   Reproductive/Obstetrics negative OB ROS                          Anesthesia Physical Anesthesia Plan  ASA: III  Anesthesia Plan: MAC   Post-op Pain Management:    Induction: Intravenous  Airway Management Planned: Simple Face Mask  Additional Equipment:   Intra-op Plan:   Post-operative Plan:   Informed Consent: I have reviewed the patients History and Physical, chart, labs and discussed the procedure including the risks, benefits and alternatives for the proposed anesthesia with the patient or authorized representative who has indicated his/her understanding and acceptance.   Dental advisory given  Plan Discussed with: CRNA  Anesthesia Plan Comments:         Anesthesia Quick Evaluation

## 2014-06-23 NOTE — Interval H&P Note (Signed)
History and Physical Interval Note:  06/23/2014 10:23 AM  Lance [name]  has presented today for surgery, with the diagnosis of End Stage Renal Disease N18.6  The various methods of treatment have been discussed with the patient and family. After consideration of risks, benefits and other options for treatment, the patient has consented to  Procedure(s): ARTERIOVENOUS (AV) FISTULA CREATION (Right) as a surgical intervention .  The patient's history has been reviewed, patient examined, no change in status, stable for surgery.  I have reviewed the patient's chart and labs.  Questions were answered to the patient's satisfaction.     Deitra Mayo

## 2014-06-23 NOTE — Progress Notes (Signed)
Patient blood glucose elevated @ 381, notified anesthesiologist @ this time, new orders received. Will continue to monitor closely.

## 2014-06-23 NOTE — H&P (View-Only) (Signed)
[facility] and [facility] Specialist of [place]  Patient name: Lance [name] MRN: [id number] DOB: 26-Jan-1971 Sex: male  REASON FOR CONSULT: Evaluate for hemodialysis access  HPI: Lance [name] is a 44 y.o. male with stage III chronic kidney disease secondary to diabetes and hypertension. He is not yet on dialysis. He denies any recent uremic symptoms. Specifically he denies nausea, vomiting, fatigue, anorexia, or palpitations. He is right-handed.   Past Medical History  Diagnosis Date  . Type 1 diabetes mellitus 1990  . Essential hypertension, benign 2000    LVH  . Hyperlipidemia 2000  . Aortic stenosis     Very mild in 05/2007  . PSVT (paroxysmal supraventricular tachycardia)     Nonsustained; asymptomatic; diagnosed by event recorder in 2006  . Degenerative joint disease     Left TKA  . Tobacco abuse, in remission     20 pack years; discontinued in 1985; bronchitic changes on chest x-ray and 2009  . Diastolic heart failure     LVEF 65-70% with grade 2 diastolic dysfunction  . CKD (chronic kidney disease) stage 3, GFR 30-59 ml/min    Family History  Problem Relation Age of Onset  . Diabetes Father   . Hypertension Father   . Diabetes Mother   . Hypertension Mother    SOCIAL HISTORY: History  Substance Use Topics  . Smoking status: Never Smoker   . Smokeless tobacco: Not on file  . Alcohol Use: No   Allergies  Allergen Reactions  . Atorvastatin Other (See Comments)    Myalgias  . Minoxidil Other (See Comments)    Fluid retention   Current Outpatient Prescriptions  Medication Sig Dispense Refill  . amLODipine (NORVASC) 10 MG tablet Take 10 mg by mouth daily.    . benzonatate (TESSALON) 100 MG capsule Take 1 capsule (100 mg total) by mouth 3 (three) times daily as needed for cough. 20 capsule 0  . carvedilol (COREG) 25 MG tablet TAKE 2 TABLETS BY MOUTH TWICE A DAY 120 tablet 3  . insulin aspart (NOVOLOG) 100 UNIT/ML injection Inject 10 Units into the skin 3  (three) times daily before meals. 10 mL 11  . insulin detemir (LEVEMIR) 100 UNIT/ML injection Inject 0.2 mLs (20 Units total) into the skin at bedtime. (Patient taking differently: Inject 30 Units into the skin at bedtime. ) 10 mL 11  . Multiple Vitamin (MULTIVITAMIN) capsule Take 1 capsule by mouth daily.    . potassium chloride SA (K-DUR,KLOR-CON) 20 MEQ tablet Take 1 tablet (20 mEq total) by mouth daily. 30 tablet 6  . torsemide (DEMADEX) 20 MG tablet Take 3 tablets (60 mg total) by mouth daily. 150 tablet 6  . losartan (COZAAR) 100 MG tablet Take 1 tablet (100 mg total) by mouth daily. (Patient not taking: Reported on 06/03/2014) 30 tablet 0   No current facility-administered medications for this visit.   REVIEW OF SYSTEMS: Valu.Nieves ] denotes positive finding; [  ] denotes negative finding  CARDIOVASCULAR:  [ ]  chest pain   [ ]  chest pressure   [ ]  palpitations   [ ]  orthopnea   [ ]  dyspnea on exertion   [ ]  claudication   [ ]  rest pain   [ ]  DVT   [ ]  phlebitis PULMONARY:   [ ]  productive cough   [ ]  asthma   [ ]  wheezing NEUROLOGIC:   [ ]  weakness  [ ]  paresthesias  [ ]  aphasia  [ ]  amaurosis  [ ]  dizziness HEMATOLOGIC:   [ ]   bleeding problems   [ ]  clotting disorders MUSCULOSKELETAL:  [ ]  joint pain   [ ]  joint swelling [ ]  leg swelling GASTROINTESTINAL: [ ]   blood in stool  [ ]   hematemesis GENITOURINARY:  [ ]   dysuria  [ ]   hematuria PSYCHIATRIC:  [ ]  history of major depression INTEGUMENTARY:  [ ]  rashes  [ ]  ulcers CONSTITUTIONAL:  [ ]  fever   [ ]  chills  PHYSICAL EXAM: Filed Vitals:   06/03/14 1511  BP: 196/100  Pulse: 84  Temp: 98 F (36.7 C)  TempSrc: Oral  Resp: 16  Height: 6\' 3"  (1.905 m)  Weight: 231 lb (104.781 kg)  SpO2: 99%   GENERAL: The patient is a well-nourished male, in no acute distress. The vital signs are documented above. CARDIOVASCULAR: There is a regular rate and rhythm. I do not detect carotid bruits. He has palpable radial and brachial pulses  bilaterally. PULMONARY: There is good air exchange bilaterally without wheezing or rales. ABDOMEN: Soft and non-tender with normal pitched bowel sounds.  MUSCULOSKELETAL: There are no major deformities or cyanosis. NEUROLOGIC: No focal weakness or paresthesias are detected. SKIN: he has a branching in his left upper arm which I think could potentially compromise the cephalic vein in the upper arm on the left. PSYCHIATRIC: The patient has a normal affect.  DATA:  I have independently interpreted his vein mapping. His forearm cephalic vein looks reasonable on the left although the upper arm cephalic vein has an area that is fairly small. This could potentially be related to where he has the bran being on his upper arm. tthe forearm and upper arm cephalic vein on the right appear to be reasonable size.  I have independently interpreted his arterial Doppler study which shows triphasic Doppler signals in the radial and ulnar positions bilaterally.  MEDICAL ISSUES:  STAGE III CHRONIC KIDNEY DISEASE: I have recommended placement of an AV fistula in the right arm. Although he is right-handed and concerned that the branding on the left upper arm could've compromises upper arm cephalic vein. He is not yet on dialysis and therefore I would not recommend placement of a graft at this time but he appears to be a reasonable candidate for a fistula. I have explained the indications for placement of an AV fistula or AV graft. I've explained that if at all possible we will place an AV fistula.  I have reviewed the risks of placement of an AV fistula including but not limited to: failure of the fistula to mature, need for subsequent interventions, and thrombosis.  All the patient's questions were answered and they are agreeable to proceed with surgery. The surgery has been scheduled for 06/23/2014.  [facility] [place] Beeper: [phone]

## 2014-06-23 NOTE — Discharge Instructions (Signed)
° ° °  06/23/2014 Lance [name] [id number] 19-Sep-1970  Surgeon(s): Angelia Mould, MD  Procedure(s): ARTERIOVENOUS (AV) FISTULA CREATION-right brachiocephalic AVF  x Do not stick fistula for 12 weeks    General Anesthesia, Care After Refer to this sheet in the next few weeks. These instructions provide you with information on caring for yourself after your procedure. Your health care provider may also give you more specific instructions. Your treatment has been planned according to current medical practices, but problems sometimes occur. Call your health care provider if you have any problems or questions after your procedure. WHAT TO EXPECT AFTER THE PROCEDURE After the procedure, it is typical to experience:  Sleepiness.  Nausea and vomiting. HOME CARE INSTRUCTIONS  For the first 24 hours after general anesthesia:  Have a responsible person with you.  Do not drive a car. If you are alone, do not take public transportation.  Do not drink alcohol.  Do not take medicine that has not been prescribed by your health care provider.  Do not sign important papers or make important decisions.  You may resume a normal diet and activities as directed by your health care provider.  Change bandages (dressings) as directed.  If you have questions or problems that seem related to general anesthesia, call the hospital and ask for the anesthetist or anesthesiologist on call. SEEK MEDICAL CARE IF:  You have nausea and vomiting that continue the day after anesthesia.  You develop a rash. SEEK IMMEDIATE MEDICAL CARE IF:   You have difficulty breathing.  You have chest pain.  You have any allergic problems. Document Released: 05/06/2000 Document Revised: 02/02/2013 Document Reviewed: 08/13/2012 [facility] Patient Information 2015 [place], [place]. This information is not intended to replace advice given to you by your health care provider. Make sure you discuss any questions you  have with your health care provider.

## 2014-06-23 NOTE — Anesthesia Postprocedure Evaluation (Signed)
  Anesthesia Post-op Note  Patient: Lance [name]  Procedure(s) Performed: Procedure(s): ARTERIOVENOUS (AV) FISTULA CREATION (Right)  Patient Location: PACU  Anesthesia Type: MAC  Level of Consciousness: awake and alert   Airway and Oxygen Therapy: Patient Spontanous Breathing  Post-op Pain: mild  Post-op Assessment: Post-op Vital signs reviewed, Patient's Cardiovascular Status Stable and Respiratory Function Stable  Post-op Vital Signs: Reviewed  Filed Vitals:   06/23/14 1243  BP:   Pulse: 81  Temp: 36.1 C  Resp: 15    Complications: No apparent anesthesia complications

## 2014-06-23 NOTE — Transfer of Care (Signed)
Immediate Anesthesia Transfer of Care Note  Patient: Lance [name]  Procedure(s) Performed: Procedure(s): ARTERIOVENOUS (AV) FISTULA CREATION (Right)  Patient Location: PACU  Anesthesia Type:MAC  Level of Consciousness: awake, alert , oriented and patient cooperative  Airway & Oxygen Therapy: Patient Spontanous Breathing and Patient connected to nasal cannula oxygen  Post-op Assessment: Report given to RN, Post -op Vital signs reviewed and stable and Patient moving all extremities  Post vital signs: Reviewed and stable  Last Vitals:  Filed Vitals:   06/23/14 0923  BP: 177/91  Pulse: 81  Temp: 36.4 C  Resp: 18    Complications: No apparent anesthesia complications

## 2014-06-24 ENCOUNTER — Telehealth: Payer: Self-pay | Admitting: Vascular Surgery

## 2014-06-24 NOTE — Telephone Encounter (Signed)
LM for pt re appt, dpm °

## 2014-06-24 NOTE — Telephone Encounter (Signed)
-----   Message from Denman George, RN sent at 06/23/2014 12:16 PM EDT ----- Regarding: [facility] Daniel log; also needs 6 wk access duplex and f/u with Dr. Scot Dock   ----- Message -----    From: Gabriel Earing, PA-C    Sent: 06/23/2014  11:52 AM      To: [facility] Charge Pool  S/p right brachiocephalic AVF [id number].   F/u with [facility] in 6 weeks with duplex.  Thanks, Aldona Bar

## 2014-06-27 ENCOUNTER — Encounter (HOSPITAL_COMMUNITY): Payer: Self-pay | Admitting: Vascular Surgery

## 2014-07-14 ENCOUNTER — Other Ambulatory Visit: Payer: Self-pay | Admitting: Cardiology

## 2014-07-16 ENCOUNTER — Other Ambulatory Visit: Payer: Self-pay | Admitting: Family Medicine

## 2014-07-18 NOTE — Telephone Encounter (Signed)
Needs office visit.

## 2014-07-21 ENCOUNTER — Encounter: Payer: Self-pay | Admitting: Family Medicine

## 2014-07-21 ENCOUNTER — Ambulatory Visit (INDEPENDENT_AMBULATORY_CARE_PROVIDER_SITE_OTHER): Payer: BLUE CROSS/BLUE SHIELD | Admitting: Family Medicine

## 2014-07-21 VITALS — BP 112/70 | Temp 99.3°F | Ht 75.0 in | Wt 224.0 lb

## 2014-07-21 DIAGNOSIS — B349 Viral infection, unspecified: Secondary | ICD-10-CM

## 2014-07-21 DIAGNOSIS — J019 Acute sinusitis, unspecified: Secondary | ICD-10-CM

## 2014-07-21 DIAGNOSIS — B9689 Other specified bacterial agents as the cause of diseases classified elsewhere: Secondary | ICD-10-CM

## 2014-07-21 MED ORDER — AZITHROMYCIN 250 MG PO TABS: ORAL_TABLET | ORAL | Status: DC

## 2014-07-21 NOTE — Progress Notes (Signed)
   Subjective:    Patient ID: Lance [name], male    DOB: [birth year], 44 y.o.   MRN: [id number]  Fever  This is a new problem. The current episode started in the past 7 days. The problem occurs constantly. The problem has been unchanged. The maximum temperature noted was 101 to 101.9 F. Associated symptoms include congestion and coughing. Pertinent negatives include no chest pain, ear pain or wheezing. He has tried acetaminophen (tylenol cold and flu) for the symptoms. The treatment provided no relief.   Patient states that he has no other concerns at this time.   patient states started off with scratchy throat for several days then drainage and congestion and coughing then fever denies shortness of breath chest tightness pressure pain relates a lot of head congestion  Review of Systems  Constitutional: Positive for fever. Negative for activity change.  HENT: Positive for congestion and rhinorrhea. Negative for ear pain.   Eyes: Negative for discharge.  Respiratory: Positive for cough. Negative for wheezing.   Cardiovascular: Negative for chest pain.    patient was advised to rest over the next few days and if he is not better by Monday to call us follow-up sooner if problems    Objective:   Physical Exam  Constitutional: He appears well-developed.  HENT:  Head: Normocephalic.  Mouth/Throat: Oropharynx is clear and moist. No oropharyngeal exudate.  Neck: Normal range of motion.  Cardiovascular: Normal rate, regular rhythm and normal heart sounds.   No murmur heard. Pulmonary/Chest: Effort normal and breath sounds normal. He has no wheezes.  Lymphadenopathy:    He has no cervical adenopathy.  Neurological: He exhibits normal muscle tone.  Skin: Skin is warm and dry.  Nursing note and vitals reviewed.         Assessment & Plan:   viral syndrome has flulike illness this been coming on for the past several days but the fever just recently lungs sound great Will treat for  secondary sinusitis if ongoing troubles follow-up warnings discussed   Acute rhinosinusitis see above Zithromax

## 2014-07-29 ENCOUNTER — Encounter: Payer: Self-pay | Admitting: Vascular Surgery

## 2014-07-31 ENCOUNTER — Other Ambulatory Visit: Payer: Self-pay | Admitting: Cardiology

## 2014-08-03 ENCOUNTER — Encounter: Payer: BLUE CROSS/BLUE SHIELD | Admitting: Vascular Surgery

## 2014-08-03 ENCOUNTER — Encounter (HOSPITAL_COMMUNITY): Payer: BLUE CROSS/BLUE SHIELD

## 2014-08-08 ENCOUNTER — Telehealth: Payer: Self-pay

## 2014-08-08 NOTE — Telephone Encounter (Signed)
Phone call rec'd from pt.  Reported the right upper arm AVF site has a "bulge" that occurs above the incision when he bends his arm.  Reported he 1st noticed this approx. 1 wk. ago.  Stated the bulge  flattens out when he stretches the arm out.  Denied any redness, warmth, or pain.  Stated the area "feels awkward."  Also reported the fingers in the right hand go numb when he bends the arm.  Stated if he stretches out the arm, the numbness goes away.  Denied pain in the right hand.  Stated he is able to grip okay with right hand, but that "it doesn't feel as strong."  Denied any open sore in the area that bulges out.  Advised will contact him with an earlier appt., than the scheduled f/u on 7/27.  Agrees w/ plan.

## 2014-08-09 ENCOUNTER — Encounter: Payer: Self-pay | Admitting: Vascular Surgery

## 2014-08-10 ENCOUNTER — Encounter: Payer: Self-pay | Admitting: Vascular Surgery

## 2014-08-10 ENCOUNTER — Ambulatory Visit (INDEPENDENT_AMBULATORY_CARE_PROVIDER_SITE_OTHER): Payer: BLUE CROSS/BLUE SHIELD | Admitting: Vascular Surgery

## 2014-08-10 ENCOUNTER — Ambulatory Visit (HOSPITAL_COMMUNITY)
Admission: RE | Admit: 2014-08-10 | Discharge: 2014-08-10 | Disposition: A | Discharge status: Home / Self Care | Payer: BLUE CROSS/BLUE SHIELD | Source: Ambulatory Visit | Attending: Vascular Surgery | Admitting: Vascular Surgery

## 2014-08-10 VITALS — BP 201/101 | HR 91 | Ht 75.0 in | Wt 235.0 lb

## 2014-08-10 DIAGNOSIS — N186 End stage renal disease: Secondary | ICD-10-CM | POA: Insufficient documentation

## 2014-08-10 DIAGNOSIS — Z48812 Encounter for surgical aftercare following surgery on the circulatory system: Secondary | ICD-10-CM | POA: Diagnosis not present

## 2014-08-10 DIAGNOSIS — N184 Chronic kidney disease, stage 4 (severe): Secondary | ICD-10-CM

## 2014-08-10 NOTE — Progress Notes (Signed)
POST OPERATIVE OFFICE NOTE    CC:  F/u for surgery  HPI:  This is a 44 y.o. male who is s/p right brachiocephalic AVF placed by Dr. Scot Dock Jun 23, 2014.  He states that he get intermittent numbness if he bends his arm for too long or if he lays with it completely outstretched.  He continues to have sensation in his fingers and motor is in tact, however, he states it is has gotten a little bit worse over the past week.  He is not yet on dialysis.  His BP is elevated today.  He states that it normally runs 150's/70's and that Dr. Posey Pronto has been managing his medications.    Allergies  Allergen Reactions  . Atorvastatin Other (See Comments)    Myalgias  . Minoxidil Other (See Comments)    Fluid retention    Current Outpatient Prescriptions  Medication Sig Dispense Refill  . amLODipine (NORVASC) 10 MG tablet TAKE 1 TABLET BY MOUTH EVERY DAY 15 tablet 0  . azithromycin (ZITHROMAX Z-PAK) 250 MG tablet Take 2 tablets (500 mg) on  Day 1,  followed by 1 tablet (250 mg) once daily on Days 2 through 5. 6 each 0  . carvedilol (COREG) 25 MG tablet TAKE 2 TABLETS BY MOUTH TWICE A DAY (Patient taking differently: TAKE 1 TABLET BY MOUTH TWICE A DAY) 120 tablet 3  . insulin aspart (NOVOLOG) 100 UNIT/ML injection Inject 10 Units into the skin 3 (three) times daily before meals. 10 mL 11  . insulin detemir (LEVEMIR) 100 UNIT/ML injection Inject 0.2 mLs (20 Units total) into the skin at bedtime. (Patient taking differently: Inject 30 Units into the skin at bedtime. ) 10 mL 11  . Multiple Vitamin (MULTIVITAMIN) capsule Take 1 capsule by mouth once a week.     Marland Kitchen oxyCODONE (ROXICODONE) 5 MG immediate release tablet Take 1 tablet (5 mg total) by mouth every 6 (six) hours as needed. 20 tablet 0  . torsemide (DEMADEX) 20 MG tablet Take 3 tablets (60 mg total) by mouth daily. (Patient taking differently: Take 40 mg by mouth 2 (two) times daily. ) 150 tablet 6   No current facility-administered medications for  this visit.     ROS:  See HPI  Physical Exam:  Filed Vitals:   08/10/14 1530  BP: 201/101  Pulse:     Incision:  Well healed Extremities:  Palpable right radial pulse; easily palpable thrill within the fistula; + bruit present throughout.  Sensation is in tact in fingers of right hand.  Motor in tact right hand.  Biphasic radial, ulnar and palmar doppler signal, which slightly improves with compression of the fistula.   Assessment/Plan:  This is a 44 y.o. male who is s/p: Right brachiocephalic AVF placed [id number] now with intermittent numbness in his fingers with bending his arm  -duplex today reveals the fistula is patent with diameters ranging from 0.77cm-0.91cm and depth from 0.20cm in the distal brachium to 0.67cm at the proximal brachium vein.   -he does have biphasic doppler signals in the right radial/ulnar and palmar arch and slightly improves with compression of the fistula -the pt is encouraged to exercise his hand with a stress ball and avoid bending his arm for long periods of time or holding his arm over his head.  Hopefully, his symptoms will improve, otherwise, he may need a plication or ligation of his fistula.  (He is not yet on dialysis).   -he will f/u with Dr. Scot Dock in 3  weeks to see how he is doing.  He will call sooner if he has worsening symptoms. -his blood pressure was elevated today and repeat reading continued to be elevated.  His BP is managed by Dr. Posey Pronto.  Pt states his pressure normally runs 150's/70's.  He is advised to monitor and record his BP twice a day and call Dr. Serita Grit office to see if any adjustments need to be made to his medication.  He expressed understanding of this.    Leontine Locket, PA-C [facility] [phone]  Clinic MD:  Pt seen and examined with Dr. Scot Dock

## 2014-08-12 NOTE — Op Note (Signed)
    NAME: Lance [name]    MRN: [id number] DOB: 1970/03/25    DATE OF OPERATION: 08/12/2014  PREOP DIAGNOSIS: Stage IV chronic kidney disease  POSTOP DIAGNOSIS: Stage IV chronic kidney disease  PROCEDURE: Right brachiocephalic AV fistula  SURGEON: Judeth Cornfield. Scot Dock, MD, FACS  ASSIST: Leontine Locket, PA  ANESTHESIA: local with sedation   EBL: normal  INDICATIONS: Lance [name] is a 44 y.o. male who presents for new access.  TECHNIQUE: The patient was taken to the operating room and sedated by anesthesia. The right upper extremity was prepped and draped in usual sterile fashion.  After the skin was anesthetized with lidocaine, a transverse incision was made at the antecubital level. Cephalic vein was dissected free and ligated distally. It irrigated nicely with heparinized saline. The brachial artery was dissected free beneath the fascia. The patient was heparinized. The brachial artery was clamped proximally and distally and a longitudinal arteriotomy was made. The vein was mobilized over and sewn end-to-side to the artery using continuous 6-0 proline suture. At the completion was a good thrill in the fistula. Hemostasis was obtained in the wound. The heparin was partially reversed with protamine. The wound was closed with deeper 3-0 Vicryl and the skin closed with 4-0 Vicryl. Dermabond was applied. The patient tolerated the procedure well and was transferred to the recovery room in stable condition. All needle and sponge counts were correct.  Deitra Mayo, MD, FACS [facility]  DATE OF DICTATION:   08/12/2014

## 2014-08-17 ENCOUNTER — Encounter: Payer: Self-pay | Admitting: Nephrology

## 2014-08-29 ENCOUNTER — Encounter: Payer: Self-pay | Admitting: Vascular Surgery

## 2014-08-31 ENCOUNTER — Ambulatory Visit: Payer: BLUE CROSS/BLUE SHIELD | Admitting: Vascular Surgery

## 2014-09-01 ENCOUNTER — Telehealth: Payer: Self-pay

## 2014-09-01 NOTE — Telephone Encounter (Signed)
-----   Message from Orinda Kenner sent at 09/01/2014 10:16 AM EDT ----- Regarding: RE: needs apt Have left patient numerous messages to call office to schedule with no luck.  Coralyn Mark ----- Message -----    From: Bernita Raisin, RN    Sent: 07/15/2014   7:10 AM      To: Bertram Gala Goins Subject: needs apt                                      Wants refills long overdue

## 2014-09-01 NOTE — Telephone Encounter (Signed)
Mailed letter to pt stating we will not refill medications until he is seen by a Cardiology provider

## 2014-09-07 ENCOUNTER — Encounter: Payer: BLUE CROSS/BLUE SHIELD | Admitting: Vascular Surgery

## 2014-09-07 ENCOUNTER — Encounter (HOSPITAL_COMMUNITY): Payer: BLUE CROSS/BLUE SHIELD

## 2014-09-14 ENCOUNTER — Other Ambulatory Visit (HOSPITAL_COMMUNITY): Payer: Self-pay | Admitting: *Deleted

## 2014-09-15 ENCOUNTER — Encounter (HOSPITAL_COMMUNITY): Payer: BLUE CROSS/BLUE SHIELD

## 2014-09-16 ENCOUNTER — Encounter (HOSPITAL_COMMUNITY)
Admission: RE | Admit: 2014-09-16 | Discharge: 2014-09-16 | Disposition: A | Discharge status: Home / Self Care | Payer: BLUE CROSS/BLUE SHIELD | Source: Ambulatory Visit | Attending: Nephrology | Admitting: Nephrology

## 2014-09-16 DIAGNOSIS — D509 Iron deficiency anemia, unspecified: Secondary | ICD-10-CM | POA: Diagnosis present

## 2014-09-16 MED ORDER — SODIUM CHLORIDE 0.9 % IV SOLN
510.0000 mg | INTRAVENOUS | Status: DC
  Administered 2014-09-16: 510 mg via INTRAVENOUS
  Filled 2014-09-16: qty 17

## 2014-09-16 NOTE — Discharge Instructions (Signed)

## 2014-09-23 ENCOUNTER — Encounter (HOSPITAL_COMMUNITY)
Admission: RE | Admit: 2014-09-23 | Discharge: 2014-09-23 | Disposition: A | Discharge status: Home / Self Care | Payer: BLUE CROSS/BLUE SHIELD | Source: Ambulatory Visit | Attending: Nephrology | Admitting: Nephrology

## 2014-09-23 DIAGNOSIS — D509 Iron deficiency anemia, unspecified: Secondary | ICD-10-CM | POA: Diagnosis not present

## 2014-09-23 MED ORDER — FERUMOXYTOL INJECTION 510 MG/17 ML
510.0000 mg | INTRAVENOUS | Status: AC
  Administered 2014-09-23: 510 mg via INTRAVENOUS
  Filled 2014-09-23: qty 17

## 2015-03-21 ENCOUNTER — Other Ambulatory Visit: Payer: Self-pay | Admitting: Family Medicine

## 2015-03-21 NOTE — Telephone Encounter (Signed)
Sorry this needs to be refilled by his diabetes specialist

## 2015-03-22 ENCOUNTER — Other Ambulatory Visit: Payer: Self-pay | Admitting: Family Medicine

## 2015-05-15 DIAGNOSIS — N186 End stage renal disease: Secondary | ICD-10-CM | POA: Diagnosis not present

## 2015-05-15 DIAGNOSIS — Z23 Encounter for immunization: Secondary | ICD-10-CM | POA: Diagnosis not present

## 2015-05-17 DIAGNOSIS — Z23 Encounter for immunization: Secondary | ICD-10-CM | POA: Diagnosis not present

## 2015-05-17 DIAGNOSIS — N186 End stage renal disease: Secondary | ICD-10-CM | POA: Diagnosis not present

## 2015-05-19 DIAGNOSIS — N186 End stage renal disease: Secondary | ICD-10-CM | POA: Diagnosis not present

## 2015-05-19 DIAGNOSIS — Z23 Encounter for immunization: Secondary | ICD-10-CM | POA: Diagnosis not present

## 2015-05-22 DIAGNOSIS — N186 End stage renal disease: Secondary | ICD-10-CM | POA: Diagnosis not present

## 2015-05-24 DIAGNOSIS — N186 End stage renal disease: Secondary | ICD-10-CM | POA: Diagnosis not present

## 2015-05-26 DIAGNOSIS — N186 End stage renal disease: Secondary | ICD-10-CM | POA: Diagnosis not present

## 2015-05-29 DIAGNOSIS — N186 End stage renal disease: Secondary | ICD-10-CM | POA: Diagnosis not present

## 2015-05-31 DIAGNOSIS — N186 End stage renal disease: Secondary | ICD-10-CM | POA: Diagnosis not present

## 2015-06-02 DIAGNOSIS — N186 End stage renal disease: Secondary | ICD-10-CM | POA: Diagnosis not present

## 2015-06-05 DIAGNOSIS — N186 End stage renal disease: Secondary | ICD-10-CM | POA: Diagnosis not present

## 2015-06-07 DIAGNOSIS — N186 End stage renal disease: Secondary | ICD-10-CM | POA: Diagnosis not present

## 2015-06-09 DIAGNOSIS — N186 End stage renal disease: Secondary | ICD-10-CM | POA: Diagnosis not present

## 2015-06-11 DIAGNOSIS — E1022 Type 1 diabetes mellitus with diabetic chronic kidney disease: Secondary | ICD-10-CM | POA: Diagnosis not present

## 2015-06-11 DIAGNOSIS — Z992 Dependence on renal dialysis: Secondary | ICD-10-CM | POA: Diagnosis not present

## 2015-06-11 DIAGNOSIS — N186 End stage renal disease: Secondary | ICD-10-CM | POA: Diagnosis not present

## 2015-06-12 DIAGNOSIS — N186 End stage renal disease: Secondary | ICD-10-CM | POA: Diagnosis not present

## 2015-06-14 DIAGNOSIS — N186 End stage renal disease: Secondary | ICD-10-CM | POA: Diagnosis not present

## 2015-06-16 DIAGNOSIS — N186 End stage renal disease: Secondary | ICD-10-CM | POA: Diagnosis not present

## 2015-06-19 DIAGNOSIS — N186 End stage renal disease: Secondary | ICD-10-CM | POA: Diagnosis not present

## 2015-06-21 DIAGNOSIS — N186 End stage renal disease: Secondary | ICD-10-CM | POA: Diagnosis not present

## 2015-06-23 ENCOUNTER — Other Ambulatory Visit: Payer: Self-pay | Admitting: Nephrology

## 2015-06-23 DIAGNOSIS — N186 End stage renal disease: Secondary | ICD-10-CM | POA: Diagnosis not present

## 2015-06-23 DIAGNOSIS — Z9489 Other transplanted organ and tissue status: Secondary | ICD-10-CM

## 2015-06-26 DIAGNOSIS — N186 End stage renal disease: Secondary | ICD-10-CM | POA: Diagnosis not present

## 2015-06-28 DIAGNOSIS — N186 End stage renal disease: Secondary | ICD-10-CM | POA: Diagnosis not present

## 2015-06-30 DIAGNOSIS — N186 End stage renal disease: Secondary | ICD-10-CM | POA: Diagnosis not present

## 2015-07-03 DIAGNOSIS — N186 End stage renal disease: Secondary | ICD-10-CM | POA: Diagnosis not present

## 2015-07-06 ENCOUNTER — Other Ambulatory Visit: Payer: BLUE CROSS/BLUE SHIELD

## 2015-07-07 DIAGNOSIS — N186 End stage renal disease: Secondary | ICD-10-CM | POA: Diagnosis not present

## 2015-07-10 DIAGNOSIS — N186 End stage renal disease: Secondary | ICD-10-CM | POA: Diagnosis not present

## 2015-07-12 DIAGNOSIS — Z992 Dependence on renal dialysis: Secondary | ICD-10-CM | POA: Diagnosis not present

## 2015-07-12 DIAGNOSIS — N186 End stage renal disease: Secondary | ICD-10-CM | POA: Diagnosis not present

## 2015-07-12 DIAGNOSIS — E1022 Type 1 diabetes mellitus with diabetic chronic kidney disease: Secondary | ICD-10-CM | POA: Diagnosis not present

## 2015-07-13 DIAGNOSIS — Z01818 Encounter for other preprocedural examination: Secondary | ICD-10-CM | POA: Diagnosis not present

## 2015-07-14 DIAGNOSIS — N186 End stage renal disease: Secondary | ICD-10-CM | POA: Diagnosis not present

## 2015-07-17 DIAGNOSIS — N186 End stage renal disease: Secondary | ICD-10-CM | POA: Diagnosis not present

## 2015-07-18 ENCOUNTER — Telehealth (HOSPITAL_COMMUNITY): Payer: Self-pay

## 2015-07-18 NOTE — Telephone Encounter (Signed)
Left message on voicemail in reference to upcoming appointment scheduled for 07/20/15. Phone number given for a call back so details instructions can be given. C.Hasspacher,RN

## 2015-07-19 DIAGNOSIS — N186 End stage renal disease: Secondary | ICD-10-CM | POA: Diagnosis not present

## 2015-07-20 ENCOUNTER — Other Ambulatory Visit (HOSPITAL_COMMUNITY): Payer: Self-pay | Admitting: Nephrology

## 2015-07-20 ENCOUNTER — Ambulatory Visit (HOSPITAL_BASED_OUTPATIENT_CLINIC_OR_DEPARTMENT_OTHER): Payer: BLUE CROSS/BLUE SHIELD

## 2015-07-20 ENCOUNTER — Ambulatory Visit (HOSPITAL_COMMUNITY): Payer: BLUE CROSS/BLUE SHIELD | Attending: Cardiovascular Disease

## 2015-07-20 DIAGNOSIS — I120 Hypertensive chronic kidney disease with stage 5 chronic kidney disease or end stage renal disease: Secondary | ICD-10-CM | POA: Insufficient documentation

## 2015-07-20 DIAGNOSIS — E785 Hyperlipidemia, unspecified: Secondary | ICD-10-CM | POA: Diagnosis not present

## 2015-07-20 DIAGNOSIS — N186 End stage renal disease: Secondary | ICD-10-CM | POA: Diagnosis not present

## 2015-07-20 DIAGNOSIS — Z01818 Encounter for other preprocedural examination: Secondary | ICD-10-CM | POA: Diagnosis not present

## 2015-07-20 DIAGNOSIS — Z01810 Encounter for preprocedural cardiovascular examination: Secondary | ICD-10-CM | POA: Diagnosis present

## 2015-07-20 DIAGNOSIS — E1122 Type 2 diabetes mellitus with diabetic chronic kidney disease: Secondary | ICD-10-CM | POA: Diagnosis not present

## 2015-07-21 DIAGNOSIS — N186 End stage renal disease: Secondary | ICD-10-CM | POA: Diagnosis not present

## 2015-07-24 DIAGNOSIS — N186 End stage renal disease: Secondary | ICD-10-CM | POA: Diagnosis not present

## 2015-07-24 DIAGNOSIS — Z23 Encounter for immunization: Secondary | ICD-10-CM | POA: Diagnosis not present

## 2015-07-26 DIAGNOSIS — Z23 Encounter for immunization: Secondary | ICD-10-CM | POA: Diagnosis not present

## 2015-07-26 DIAGNOSIS — N186 End stage renal disease: Secondary | ICD-10-CM | POA: Diagnosis not present

## 2015-07-28 DIAGNOSIS — Z23 Encounter for immunization: Secondary | ICD-10-CM | POA: Diagnosis not present

## 2015-07-28 DIAGNOSIS — N186 End stage renal disease: Secondary | ICD-10-CM | POA: Diagnosis not present

## 2015-07-31 ENCOUNTER — Ambulatory Visit
Admission: RE | Admit: 2015-07-31 | Discharge: 2015-07-31 | Disposition: A | Discharge status: Home / Self Care | Payer: BLUE CROSS/BLUE SHIELD | Source: Ambulatory Visit | Attending: Nephrology | Admitting: Nephrology

## 2015-07-31 DIAGNOSIS — N186 End stage renal disease: Secondary | ICD-10-CM | POA: Diagnosis not present

## 2015-07-31 DIAGNOSIS — Z9489 Other transplanted organ and tissue status: Secondary | ICD-10-CM

## 2015-07-31 DIAGNOSIS — R197 Diarrhea, unspecified: Secondary | ICD-10-CM | POA: Diagnosis not present

## 2015-08-02 DIAGNOSIS — R197 Diarrhea, unspecified: Secondary | ICD-10-CM | POA: Diagnosis not present

## 2015-08-02 DIAGNOSIS — N186 End stage renal disease: Secondary | ICD-10-CM | POA: Diagnosis not present

## 2015-08-04 DIAGNOSIS — N186 End stage renal disease: Secondary | ICD-10-CM | POA: Diagnosis not present

## 2015-08-04 DIAGNOSIS — R197 Diarrhea, unspecified: Secondary | ICD-10-CM | POA: Diagnosis not present

## 2015-08-07 DIAGNOSIS — N186 End stage renal disease: Secondary | ICD-10-CM | POA: Diagnosis not present

## 2015-08-09 DIAGNOSIS — N186 End stage renal disease: Secondary | ICD-10-CM | POA: Diagnosis not present

## 2015-08-11 DIAGNOSIS — Z992 Dependence on renal dialysis: Secondary | ICD-10-CM | POA: Diagnosis not present

## 2015-08-11 DIAGNOSIS — E1022 Type 1 diabetes mellitus with diabetic chronic kidney disease: Secondary | ICD-10-CM | POA: Diagnosis not present

## 2015-08-11 DIAGNOSIS — N186 End stage renal disease: Secondary | ICD-10-CM | POA: Diagnosis not present

## 2015-08-14 DIAGNOSIS — N186 End stage renal disease: Secondary | ICD-10-CM | POA: Diagnosis not present

## 2015-08-16 DIAGNOSIS — N186 End stage renal disease: Secondary | ICD-10-CM | POA: Diagnosis not present

## 2015-08-18 DIAGNOSIS — N186 End stage renal disease: Secondary | ICD-10-CM | POA: Diagnosis not present

## 2015-08-21 DIAGNOSIS — Z23 Encounter for immunization: Secondary | ICD-10-CM | POA: Diagnosis not present

## 2015-08-21 DIAGNOSIS — N186 End stage renal disease: Secondary | ICD-10-CM | POA: Diagnosis not present

## 2015-08-23 DIAGNOSIS — Z23 Encounter for immunization: Secondary | ICD-10-CM | POA: Diagnosis not present

## 2015-08-23 DIAGNOSIS — N186 End stage renal disease: Secondary | ICD-10-CM | POA: Diagnosis not present

## 2015-08-25 DIAGNOSIS — N186 End stage renal disease: Secondary | ICD-10-CM | POA: Diagnosis not present

## 2015-08-25 DIAGNOSIS — Z23 Encounter for immunization: Secondary | ICD-10-CM | POA: Diagnosis not present

## 2015-08-28 DIAGNOSIS — E8779 Other fluid overload: Secondary | ICD-10-CM | POA: Diagnosis not present

## 2015-08-28 DIAGNOSIS — N186 End stage renal disease: Secondary | ICD-10-CM | POA: Diagnosis not present

## 2015-08-29 DIAGNOSIS — E8779 Other fluid overload: Secondary | ICD-10-CM | POA: Diagnosis not present

## 2015-08-29 DIAGNOSIS — N186 End stage renal disease: Secondary | ICD-10-CM | POA: Diagnosis not present

## 2015-08-31 DIAGNOSIS — E8779 Other fluid overload: Secondary | ICD-10-CM | POA: Diagnosis not present

## 2015-08-31 DIAGNOSIS — N186 End stage renal disease: Secondary | ICD-10-CM | POA: Diagnosis not present

## 2015-09-01 DIAGNOSIS — E8779 Other fluid overload: Secondary | ICD-10-CM | POA: Diagnosis not present

## 2015-09-01 DIAGNOSIS — N186 End stage renal disease: Secondary | ICD-10-CM | POA: Diagnosis not present

## 2015-09-04 DIAGNOSIS — N186 End stage renal disease: Secondary | ICD-10-CM | POA: Diagnosis not present

## 2015-09-06 DIAGNOSIS — N186 End stage renal disease: Secondary | ICD-10-CM | POA: Diagnosis not present

## 2015-09-08 DIAGNOSIS — N186 End stage renal disease: Secondary | ICD-10-CM | POA: Diagnosis not present

## 2015-09-11 DIAGNOSIS — Z992 Dependence on renal dialysis: Secondary | ICD-10-CM | POA: Diagnosis not present

## 2015-09-11 DIAGNOSIS — N186 End stage renal disease: Secondary | ICD-10-CM | POA: Diagnosis not present

## 2015-09-11 DIAGNOSIS — E1022 Type 1 diabetes mellitus with diabetic chronic kidney disease: Secondary | ICD-10-CM | POA: Diagnosis not present

## 2015-09-13 DIAGNOSIS — N186 End stage renal disease: Secondary | ICD-10-CM | POA: Diagnosis not present

## 2015-09-13 DIAGNOSIS — Z029 Encounter for administrative examinations, unspecified: Secondary | ICD-10-CM

## 2015-09-15 DIAGNOSIS — N186 End stage renal disease: Secondary | ICD-10-CM | POA: Diagnosis not present

## 2015-09-18 DIAGNOSIS — N186 End stage renal disease: Secondary | ICD-10-CM | POA: Diagnosis not present

## 2015-09-20 DIAGNOSIS — N186 End stage renal disease: Secondary | ICD-10-CM | POA: Diagnosis not present

## 2015-09-22 DIAGNOSIS — N186 End stage renal disease: Secondary | ICD-10-CM | POA: Diagnosis not present

## 2015-09-25 DIAGNOSIS — N186 End stage renal disease: Secondary | ICD-10-CM | POA: Diagnosis not present

## 2015-09-27 DIAGNOSIS — N186 End stage renal disease: Secondary | ICD-10-CM | POA: Diagnosis not present

## 2015-09-29 DIAGNOSIS — N186 End stage renal disease: Secondary | ICD-10-CM | POA: Diagnosis not present

## 2015-10-02 DIAGNOSIS — N186 End stage renal disease: Secondary | ICD-10-CM | POA: Diagnosis not present

## 2015-10-04 DIAGNOSIS — N186 End stage renal disease: Secondary | ICD-10-CM | POA: Diagnosis not present

## 2015-10-06 DIAGNOSIS — N186 End stage renal disease: Secondary | ICD-10-CM | POA: Diagnosis not present

## 2015-10-09 DIAGNOSIS — N186 End stage renal disease: Secondary | ICD-10-CM | POA: Diagnosis not present

## 2015-10-11 DIAGNOSIS — N186 End stage renal disease: Secondary | ICD-10-CM | POA: Diagnosis not present

## 2015-10-12 DIAGNOSIS — N186 End stage renal disease: Secondary | ICD-10-CM | POA: Diagnosis not present

## 2015-10-12 DIAGNOSIS — Z992 Dependence on renal dialysis: Secondary | ICD-10-CM | POA: Diagnosis not present

## 2015-10-12 DIAGNOSIS — E1022 Type 1 diabetes mellitus with diabetic chronic kidney disease: Secondary | ICD-10-CM | POA: Diagnosis not present

## 2015-10-13 DIAGNOSIS — N186 End stage renal disease: Secondary | ICD-10-CM | POA: Diagnosis not present

## 2015-10-16 DIAGNOSIS — N186 End stage renal disease: Secondary | ICD-10-CM | POA: Diagnosis not present

## 2015-10-18 DIAGNOSIS — N186 End stage renal disease: Secondary | ICD-10-CM | POA: Diagnosis not present

## 2015-10-20 DIAGNOSIS — N186 End stage renal disease: Secondary | ICD-10-CM | POA: Diagnosis not present

## 2015-10-23 DIAGNOSIS — N186 End stage renal disease: Secondary | ICD-10-CM | POA: Diagnosis not present

## 2015-10-25 DIAGNOSIS — N186 End stage renal disease: Secondary | ICD-10-CM | POA: Diagnosis not present

## 2015-10-27 DIAGNOSIS — N186 End stage renal disease: Secondary | ICD-10-CM | POA: Diagnosis not present

## 2015-10-30 DIAGNOSIS — Z23 Encounter for immunization: Secondary | ICD-10-CM | POA: Diagnosis not present

## 2015-10-30 DIAGNOSIS — N186 End stage renal disease: Secondary | ICD-10-CM | POA: Diagnosis not present

## 2015-11-01 DIAGNOSIS — Z23 Encounter for immunization: Secondary | ICD-10-CM | POA: Diagnosis not present

## 2015-11-01 DIAGNOSIS — N186 End stage renal disease: Secondary | ICD-10-CM | POA: Diagnosis not present

## 2015-11-03 DIAGNOSIS — N186 End stage renal disease: Secondary | ICD-10-CM | POA: Diagnosis not present

## 2015-11-03 DIAGNOSIS — Z23 Encounter for immunization: Secondary | ICD-10-CM | POA: Diagnosis not present

## 2015-11-06 DIAGNOSIS — N186 End stage renal disease: Secondary | ICD-10-CM | POA: Diagnosis not present

## 2015-11-08 DIAGNOSIS — N186 End stage renal disease: Secondary | ICD-10-CM | POA: Diagnosis not present

## 2015-11-10 DIAGNOSIS — N186 End stage renal disease: Secondary | ICD-10-CM | POA: Diagnosis not present

## 2015-11-11 DIAGNOSIS — Z992 Dependence on renal dialysis: Secondary | ICD-10-CM | POA: Diagnosis not present

## 2015-11-11 DIAGNOSIS — E1022 Type 1 diabetes mellitus with diabetic chronic kidney disease: Secondary | ICD-10-CM | POA: Diagnosis not present

## 2015-11-11 DIAGNOSIS — N186 End stage renal disease: Secondary | ICD-10-CM | POA: Diagnosis not present

## 2015-11-12 ENCOUNTER — Emergency Department (HOSPITAL_COMMUNITY): Payer: BLUE CROSS/BLUE SHIELD

## 2015-11-12 ENCOUNTER — Inpatient Hospital Stay (HOSPITAL_COMMUNITY)
Admission: EM | Admit: 2015-11-12 | Discharge: 2015-11-16 | DRG: 193 | Disposition: A | Discharge status: Home / Self Care | Payer: BLUE CROSS/BLUE SHIELD | Attending: Internal Medicine | Admitting: Internal Medicine

## 2015-11-12 ENCOUNTER — Encounter (HOSPITAL_COMMUNITY): Payer: Self-pay | Admitting: *Deleted

## 2015-11-12 DIAGNOSIS — I083 Combined rheumatic disorders of mitral, aortic and tricuspid valves: Secondary | ICD-10-CM | POA: Diagnosis not present

## 2015-11-12 DIAGNOSIS — Z96652 Presence of left artificial knee joint: Secondary | ICD-10-CM | POA: Diagnosis present

## 2015-11-12 DIAGNOSIS — R509 Fever, unspecified: Secondary | ICD-10-CM

## 2015-11-12 DIAGNOSIS — R112 Nausea with vomiting, unspecified: Secondary | ICD-10-CM | POA: Diagnosis present

## 2015-11-12 DIAGNOSIS — I129 Hypertensive chronic kidney disease with stage 1 through stage 4 chronic kidney disease, or unspecified chronic kidney disease: Secondary | ICD-10-CM | POA: Diagnosis not present

## 2015-11-12 DIAGNOSIS — N183 Chronic kidney disease, stage 3 (moderate): Secondary | ICD-10-CM | POA: Diagnosis not present

## 2015-11-12 DIAGNOSIS — M7521 Bicipital tendinitis, right shoulder: Secondary | ICD-10-CM | POA: Diagnosis present

## 2015-11-12 DIAGNOSIS — X58XXXA Exposure to other specified factors, initial encounter: Secondary | ICD-10-CM | POA: Diagnosis present

## 2015-11-12 DIAGNOSIS — Z794 Long term (current) use of insulin: Secondary | ICD-10-CM

## 2015-11-12 DIAGNOSIS — E785 Hyperlipidemia, unspecified: Secondary | ICD-10-CM | POA: Diagnosis present

## 2015-11-12 DIAGNOSIS — D649 Anemia, unspecified: Secondary | ICD-10-CM | POA: Diagnosis not present

## 2015-11-12 DIAGNOSIS — Z833 Family history of diabetes mellitus: Secondary | ICD-10-CM | POA: Diagnosis not present

## 2015-11-12 DIAGNOSIS — Z888 Allergy status to other drugs, medicaments and biological substances status: Secondary | ICD-10-CM | POA: Diagnosis not present

## 2015-11-12 DIAGNOSIS — Z87891 Personal history of nicotine dependence: Secondary | ICD-10-CM

## 2015-11-12 DIAGNOSIS — I132 Hypertensive heart and chronic kidney disease with heart failure and with stage 5 chronic kidney disease, or end stage renal disease: Secondary | ICD-10-CM | POA: Diagnosis present

## 2015-11-12 DIAGNOSIS — B999 Unspecified infectious disease: Secondary | ICD-10-CM

## 2015-11-12 DIAGNOSIS — M19011 Primary osteoarthritis, right shoulder: Secondary | ICD-10-CM | POA: Diagnosis present

## 2015-11-12 DIAGNOSIS — R197 Diarrhea, unspecified: Secondary | ICD-10-CM | POA: Diagnosis present

## 2015-11-12 DIAGNOSIS — M25519 Pain in unspecified shoulder: Secondary | ICD-10-CM | POA: Diagnosis present

## 2015-11-12 DIAGNOSIS — Z79899 Other long term (current) drug therapy: Secondary | ICD-10-CM

## 2015-11-12 DIAGNOSIS — S46811A Strain of other muscles, fascia and tendons at shoulder and upper arm level, right arm, initial encounter: Secondary | ICD-10-CM | POA: Diagnosis present

## 2015-11-12 DIAGNOSIS — E1022 Type 1 diabetes mellitus with diabetic chronic kidney disease: Secondary | ICD-10-CM | POA: Diagnosis present

## 2015-11-12 DIAGNOSIS — I471 Supraventricular tachycardia: Secondary | ICD-10-CM | POA: Diagnosis not present

## 2015-11-12 DIAGNOSIS — J154 Pneumonia due to other streptococci: Principal | ICD-10-CM | POA: Diagnosis present

## 2015-11-12 DIAGNOSIS — E876 Hypokalemia: Secondary | ICD-10-CM | POA: Diagnosis present

## 2015-11-12 DIAGNOSIS — M25511 Pain in right shoulder: Secondary | ICD-10-CM | POA: Diagnosis not present

## 2015-11-12 DIAGNOSIS — N049 Nephrotic syndrome with unspecified morphologic changes: Secondary | ICD-10-CM | POA: Diagnosis not present

## 2015-11-12 DIAGNOSIS — R0781 Pleurodynia: Secondary | ICD-10-CM | POA: Diagnosis not present

## 2015-11-12 DIAGNOSIS — E871 Hypo-osmolality and hyponatremia: Secondary | ICD-10-CM | POA: Diagnosis present

## 2015-11-12 DIAGNOSIS — Z8249 Family history of ischemic heart disease and other diseases of the circulatory system: Secondary | ICD-10-CM | POA: Diagnosis not present

## 2015-11-12 DIAGNOSIS — M898X9 Other specified disorders of bone, unspecified site: Secondary | ICD-10-CM | POA: Diagnosis present

## 2015-11-12 DIAGNOSIS — E875 Hyperkalemia: Secondary | ICD-10-CM | POA: Diagnosis present

## 2015-11-12 DIAGNOSIS — I10 Essential (primary) hypertension: Secondary | ICD-10-CM | POA: Diagnosis present

## 2015-11-12 DIAGNOSIS — I5032 Chronic diastolic (congestive) heart failure: Secondary | ICD-10-CM | POA: Diagnosis not present

## 2015-11-12 DIAGNOSIS — M50221 Other cervical disc displacement at C4-C5 level: Secondary | ICD-10-CM | POA: Diagnosis not present

## 2015-11-12 DIAGNOSIS — J90 Pleural effusion, not elsewhere classified: Secondary | ICD-10-CM | POA: Diagnosis not present

## 2015-11-12 DIAGNOSIS — A084 Viral intestinal infection, unspecified: Secondary | ICD-10-CM | POA: Diagnosis present

## 2015-11-12 DIAGNOSIS — M25411 Effusion, right shoulder: Secondary | ICD-10-CM

## 2015-11-12 DIAGNOSIS — R739 Hyperglycemia, unspecified: Secondary | ICD-10-CM

## 2015-11-12 DIAGNOSIS — M75111 Incomplete rotator cuff tear or rupture of right shoulder, not specified as traumatic: Secondary | ICD-10-CM | POA: Diagnosis not present

## 2015-11-12 DIAGNOSIS — Z992 Dependence on renal dialysis: Secondary | ICD-10-CM | POA: Diagnosis not present

## 2015-11-12 DIAGNOSIS — N186 End stage renal disease: Secondary | ICD-10-CM | POA: Diagnosis not present

## 2015-11-12 DIAGNOSIS — R52 Pain, unspecified: Secondary | ICD-10-CM

## 2015-11-12 LAB — BASIC METABOLIC PANEL
ANION GAP: 14 (ref 5–15)
ANION GAP: 17 — AB (ref 5–15)
Anion gap: 16 — ABNORMAL HIGH (ref 5–15)
Anion gap: 15 (ref 5–15)
Anion gap: 12 (ref 5–15)
BUN: 75 mg/dL — ABNORMAL HIGH (ref 6–20)
BUN: 80 mg/dL — ABNORMAL HIGH (ref 6–20)
BUN: 78 mg/dL — ABNORMAL HIGH (ref 6–20)
BUN: 80 mg/dL — ABNORMAL HIGH (ref 6–20)
BUN: 31 mg/dL — AB (ref 6–20)
CALCIUM: 8.2 mg/dL — AB (ref 8.9–10.3)
CALCIUM: 8.1 mg/dL — AB (ref 8.9–10.3)
CALCIUM: 8.2 mg/dL — AB (ref 8.9–10.3)
CALCIUM: 8 mg/dL — AB (ref 8.9–10.3)
CHLORIDE: 80 mmol/L — AB (ref 101–111)
CHLORIDE: 93 mmol/L — AB (ref 101–111)
CO2: 22 mmol/L (ref 22–32)
CO2: 24 mmol/L (ref 22–32)
CO2: 22 mmol/L (ref 22–32)
CO2: 24 mmol/L (ref 22–32)
CO2: 25 mmol/L (ref 22–32)
CREATININE: 12.35 mg/dL — AB (ref 0.61–1.24)
CREATININE: 13.53 mg/dL — AB (ref 0.61–1.24)
CREATININE: 6.82 mg/dL — AB (ref 0.61–1.24)
Calcium: 8 mg/dL — ABNORMAL LOW (ref 8.9–10.3)
Chloride: 83 mmol/L — ABNORMAL LOW (ref 101–111)
Chloride: 86 mmol/L — ABNORMAL LOW (ref 101–111)
Chloride: 87 mmol/L — ABNORMAL LOW (ref 101–111)
Creatinine, Ser: 12.88 mg/dL — ABNORMAL HIGH (ref 0.61–1.24)
Creatinine, Ser: 13.38 mg/dL — ABNORMAL HIGH (ref 0.61–1.24)
GFR calc Af Amer: 5 mL/min — ABNORMAL LOW (ref >60)
GFR calc Af Amer: 4 mL/min — ABNORMAL LOW (ref >60)
GFR calc Af Amer: 10 mL/min — ABNORMAL LOW (ref >60)
GFR calc non Af Amer: 4 mL/min — ABNORMAL LOW (ref >60)
GFR, EST AFRICAN AMERICAN: 5 mL/min — AB (ref >60)
GFR, EST AFRICAN AMERICAN: 4 mL/min — AB (ref >60)
GFR, EST NON AFRICAN AMERICAN: 4 mL/min — AB (ref >60)
GFR, EST NON AFRICAN AMERICAN: 4 mL/min — AB (ref >60)
GFR, EST NON AFRICAN AMERICAN: 4 mL/min — AB (ref >60)
GFR, EST NON AFRICAN AMERICAN: 9 mL/min — AB (ref >60)
Glucose, Bld: 595 mg/dL (ref 65–99)
Glucose, Bld: 444 mg/dL — ABNORMAL HIGH (ref 65–99)
Glucose, Bld: 295 mg/dL — ABNORMAL HIGH (ref 65–99)
Glucose, Bld: 190 mg/dL — ABNORMAL HIGH (ref 65–99)
Glucose, Bld: 222 mg/dL — ABNORMAL HIGH (ref 65–99)
Potassium: 6.2 mmol/L — ABNORMAL HIGH (ref 3.5–5.1)
Potassium: 5.9 mmol/L — ABNORMAL HIGH (ref 3.5–5.1)
Potassium: 5 mmol/L (ref 3.5–5.1)
Potassium: 4.8 mmol/L (ref 3.5–5.1)
Potassium: 4 mmol/L (ref 3.5–5.1)
SODIUM: 118 mmol/L — AB (ref 135–145)
SODIUM: 121 mmol/L — AB (ref 135–145)
SODIUM: 125 mmol/L — AB (ref 135–145)
SODIUM: 126 mmol/L — AB (ref 135–145)
SODIUM: 130 mmol/L — AB (ref 135–145)

## 2015-11-12 LAB — CBC WITH DIFFERENTIAL/PLATELET
BASOS ABS: 0 10*3/uL (ref 0.0–0.1)
Basophils Relative: 0 %
EOS ABS: 0 10*3/uL (ref 0.0–0.7)
EOS PCT: 0 %
HCT: 34.2 % — ABNORMAL LOW (ref 39.0–52.0)
Hemoglobin: 11.5 g/dL — ABNORMAL LOW (ref 13.0–17.0)
Lymphocytes Relative: 10 %
Lymphs Abs: 1.6 10*3/uL (ref 0.7–4.0)
MCH: 33.1 pg (ref 26.0–34.0)
MCHC: 33.6 g/dL (ref 30.0–36.0)
MCV: 98.6 fL (ref 78.0–100.0)
Monocytes Absolute: 0.8 10*3/uL (ref 0.1–1.0)
Monocytes Relative: 5 %
Neutro Abs: 13 10*3/uL — ABNORMAL HIGH (ref 1.7–7.7)
Neutrophils Relative %: 84 %
PLATELETS: 175 10*3/uL (ref 150–400)
RBC: 3.47 MIL/uL — AB (ref 4.22–5.81)
RDW: 14.5 % (ref 11.5–15.5)
WBC: 15.3 10*3/uL — AB (ref 4.0–10.5)

## 2015-11-12 LAB — TROPONIN I
TROPONIN I: 0.07 ng/mL — AB (ref <0.03)
Troponin I: 0.06 ng/mL (ref <0.03)
Troponin I: 0.2 ng/mL (ref <0.03)
Troponin I: 0.49 ng/mL (ref <0.03)

## 2015-11-12 LAB — CBG MONITORING, ED: Glucose-Capillary: 581 mg/dL (ref 65–99)

## 2015-11-12 LAB — COMPREHENSIVE METABOLIC PANEL
ALBUMIN: 3.3 g/dL — AB (ref 3.5–5.0)
ALK PHOS: 64 U/L (ref 38–126)
ALT: 16 U/L — AB (ref 17–63)
AST: 20 U/L (ref 15–41)
Anion gap: 16 — ABNORMAL HIGH (ref 5–15)
BILIRUBIN TOTAL: 0.9 mg/dL (ref 0.3–1.2)
BUN: 76 mg/dL — AB (ref 6–20)
CALCIUM: 7.9 mg/dL — AB (ref 8.9–10.3)
CO2: 22 mmol/L (ref 22–32)
CREATININE: 12.35 mg/dL — AB (ref 0.61–1.24)
Chloride: 78 mmol/L — ABNORMAL LOW (ref 101–111)
GFR calc Af Amer: 5 mL/min — ABNORMAL LOW (ref >60)
GFR, EST NON AFRICAN AMERICAN: 4 mL/min — AB (ref >60)
GLUCOSE: 721 mg/dL — AB (ref 65–99)
Potassium: 6.9 mmol/L (ref 3.5–5.1)
Sodium: 116 mmol/L — CL (ref 135–145)
TOTAL PROTEIN: 8 g/dL (ref 6.5–8.1)

## 2015-11-12 LAB — OSMOLALITY: Osmolality: 312 mOsm/kg — ABNORMAL HIGH (ref 275–295)

## 2015-11-12 LAB — GLUCOSE, CAPILLARY
GLUCOSE-CAPILLARY: 409 mg/dL — AB (ref 65–99)
GLUCOSE-CAPILLARY: 179 mg/dL — AB (ref 65–99)
Glucose-Capillary: 448 mg/dL — ABNORMAL HIGH (ref 65–99)
Glucose-Capillary: 239 mg/dL — ABNORMAL HIGH (ref 65–99)
Glucose-Capillary: 207 mg/dL — ABNORMAL HIGH (ref 65–99)

## 2015-11-12 LAB — MRSA PCR SCREENING: MRSA BY PCR: NEGATIVE

## 2015-11-12 LAB — OSMOLALITY, URINE: OSMOLALITY UR: 318 mosm/kg (ref 300–900)

## 2015-11-12 LAB — LACTIC ACID, PLASMA: Lactic Acid, Venous: 1.6 mmol/L (ref 0.5–1.9)

## 2015-11-12 MED ORDER — PIPERACILLIN-TAZOBACTAM IN DEX 2-0.25 GM/50ML IV SOLN
2.2500 g | Freq: Three times a day (TID) | INTRAVENOUS | Status: DC
  Filled 2015-11-12 (×4): qty 50

## 2015-11-12 MED ORDER — SODIUM CHLORIDE 0.9 % IV SOLN
INTRAVENOUS | Status: AC
  Administered 2015-11-12: 08:00:00 via INTRAVENOUS

## 2015-11-12 MED ORDER — PIPERACILLIN SOD-TAZOBACTAM SO 2.25 (2-0.25) G IV SOLR
2.2500 g | Freq: Three times a day (TID) | INTRAVENOUS | Status: DC
  Administered 2015-11-12 – 2015-11-13 (×3): 2.25 g via INTRAVENOUS
  Filled 2015-11-12 (×7): qty 2.25

## 2015-11-12 MED ORDER — SODIUM CHLORIDE 0.9% FLUSH: 3.0000 mL | INTRAVENOUS | Status: DC | PRN

## 2015-11-12 MED ORDER — DICLOFENAC SODIUM 1 % TD GEL
4.0000 g | Freq: Four times a day (QID) | TRANSDERMAL | Status: DC
  Administered 2015-11-12 – 2015-11-16 (×11): 4 g via TOPICAL
  Filled 2015-11-12: qty 100

## 2015-11-12 MED ORDER — SODIUM CHLORIDE 0.9% FLUSH
3.0000 mL | Freq: Two times a day (BID) | INTRAVENOUS | Status: DC
  Administered 2015-11-12 – 2015-11-15 (×5): 3 mL via INTRAVENOUS

## 2015-11-12 MED ORDER — SODIUM CHLORIDE 0.9 % IV SOLN
INTRAVENOUS | Status: DC
  Filled 2015-11-12: qty 2.5

## 2015-11-12 MED ORDER — VANCOMYCIN HCL 10 G IV SOLR
1500.0000 mg | Freq: Once | INTRAVENOUS | Status: AC
  Administered 2015-11-12: 1500 mg via INTRAVENOUS
  Filled 2015-11-12: qty 1500

## 2015-11-12 MED ORDER — CALCIUM GLUCONATE 10 % IV SOLN
1.0000 g | Freq: Once | INTRAVENOUS | Status: AC
  Administered 2015-11-12: 1 g via INTRAVENOUS
  Filled 2015-11-12: qty 10

## 2015-11-12 MED ORDER — SODIUM CHLORIDE 0.9 % IV SOLN: 250.0000 mL | INTRAVENOUS | Status: DC | PRN

## 2015-11-12 MED ORDER — LIDOCAINE-PRILOCAINE 2.5-2.5 % EX CREA
1.0000 "application " | TOPICAL_CREAM | CUTANEOUS | Status: DC | PRN
  Filled 2015-11-12: qty 5

## 2015-11-12 MED ORDER — PENTAFLUOROPROP-TETRAFLUOROETH EX AERO
1.0000 "application " | INHALATION_SPRAY | CUTANEOUS | Status: DC | PRN
  Filled 2015-11-12: qty 30

## 2015-11-12 MED ORDER — SODIUM CHLORIDE 0.9 % IV SOLN: 100.0000 mL | INTRAVENOUS | Status: DC | PRN

## 2015-11-12 MED ORDER — ONDANSETRON HCL 4 MG PO TABS: 4.0000 mg | ORAL_TABLET | Freq: Four times a day (QID) | ORAL | Status: DC | PRN

## 2015-11-12 MED ORDER — HYDROMORPHONE HCL 1 MG/ML IJ SOLN
1.0000 mg | Freq: Once | INTRAMUSCULAR | Status: AC
  Administered 2015-11-12: 1 mg via INTRAVENOUS
  Filled 2015-11-12: qty 1

## 2015-11-12 MED ORDER — TRAMADOL HCL 50 MG PO TABS
100.0000 mg | ORAL_TABLET | Freq: Two times a day (BID) | ORAL | Status: DC | PRN
  Administered 2015-11-12 – 2015-11-14 (×4): 100 mg via ORAL
  Filled 2015-11-12 (×5): qty 2

## 2015-11-12 MED ORDER — TRAMADOL HCL 50 MG PO TABS: 100.0000 mg | ORAL_TABLET | Freq: Four times a day (QID) | ORAL | Status: DC

## 2015-11-12 MED ORDER — IBUPROFEN 400 MG PO TABS
400.0000 mg | ORAL_TABLET | Freq: Four times a day (QID) | ORAL | Status: DC
  Administered 2015-11-12 – 2015-11-16 (×15): 400 mg via ORAL
  Filled 2015-11-12 (×16): qty 1

## 2015-11-12 MED ORDER — ACETAMINOPHEN 500 MG PO TABS
500.0000 mg | ORAL_TABLET | Freq: Four times a day (QID) | ORAL | Status: DC | PRN
  Administered 2015-11-12: 500 mg via ORAL
  Filled 2015-11-12: qty 1

## 2015-11-12 MED ORDER — VANCOMYCIN HCL IN DEXTROSE 1-5 GM/200ML-% IV SOLN
1000.0000 mg | Freq: Once | INTRAVENOUS | Status: AC
  Administered 2015-11-12: 1000 mg via INTRAVENOUS
  Filled 2015-11-12: qty 200

## 2015-11-12 MED ORDER — ALBUTEROL SULFATE (2.5 MG/3ML) 0.083% IN NEBU
5.0000 mg | INHALATION_SOLUTION | Freq: Once | RESPIRATORY_TRACT | Status: AC
  Administered 2015-11-12: 5 mg via RESPIRATORY_TRACT
  Filled 2015-11-12: qty 6

## 2015-11-12 MED ORDER — ONDANSETRON HCL 4 MG/2ML IJ SOLN
4.0000 mg | Freq: Four times a day (QID) | INTRAMUSCULAR | Status: DC | PRN
  Administered 2015-11-12 – 2015-11-14 (×2): 4 mg via INTRAVENOUS
  Filled 2015-11-12 (×2): qty 2

## 2015-11-12 MED ORDER — INSULIN ASPART 100 UNIT/ML IV SOLN
6.0000 [IU] | Freq: Once | INTRAVENOUS | Status: AC
  Administered 2015-11-12: 6 [IU] via INTRAVENOUS

## 2015-11-12 MED ORDER — INSULIN ASPART 100 UNIT/ML ~~LOC~~ SOLN
10.0000 [IU] | Freq: Three times a day (TID) | SUBCUTANEOUS | Status: DC
  Administered 2015-11-12 – 2015-11-13 (×3): 10 [IU] via SUBCUTANEOUS

## 2015-11-12 MED ORDER — AMLODIPINE BESYLATE 5 MG PO TABS
10.0000 mg | ORAL_TABLET | Freq: Every day | ORAL | Status: DC
  Administered 2015-11-12 – 2015-11-16 (×4): 10 mg via ORAL
  Filled 2015-11-12 (×4): qty 2

## 2015-11-12 MED ORDER — ONDANSETRON HCL 4 MG/2ML IJ SOLN
4.0000 mg | Freq: Once | INTRAMUSCULAR | Status: AC
  Administered 2015-11-12: 4 mg via INTRAVENOUS
  Filled 2015-11-12: qty 2

## 2015-11-12 MED ORDER — CARVEDILOL 12.5 MG PO TABS
25.0000 mg | ORAL_TABLET | Freq: Two times a day (BID) | ORAL | Status: DC
  Administered 2015-11-12 – 2015-11-16 (×8): 25 mg via ORAL
  Filled 2015-11-12 (×8): qty 2

## 2015-11-12 MED ORDER — DEXTROSE-NACL 5-0.45 % IV SOLN: INTRAVENOUS | Status: DC

## 2015-11-12 MED ORDER — HEPARIN SODIUM (PORCINE) 1000 UNIT/ML DIALYSIS
20.0000 [IU]/kg | INTRAMUSCULAR | Status: DC | PRN
  Filled 2015-11-12: qty 3

## 2015-11-12 MED ORDER — LIDOCAINE HCL (PF) 1 % IJ SOLN
5.0000 mL | INTRAMUSCULAR | Status: DC | PRN
Start: 2015-11-12 — End: 2015-11-16

## 2015-11-12 MED ORDER — SODIUM POLYSTYRENE SULFONATE 15 GM/60ML PO SUSP
30.0000 g | Freq: Once | ORAL | Status: AC
  Administered 2015-11-12: 30 g via ORAL
  Filled 2015-11-12: qty 120

## 2015-11-12 MED ORDER — SODIUM CHLORIDE 0.9% FLUSH
3.0000 mL | Freq: Two times a day (BID) | INTRAVENOUS | Status: DC
  Administered 2015-11-12 – 2015-11-15 (×4): 3 mL via INTRAVENOUS

## 2015-11-12 MED ORDER — SODIUM CHLORIDE 0.9 % IV BOLUS (SEPSIS)
500.0000 mL | Freq: Once | INTRAVENOUS | Status: AC
  Administered 2015-11-12: 500 mL via INTRAVENOUS

## 2015-11-12 MED ORDER — INSULIN ASPART 100 UNIT/ML IV SOLN
10.0000 [IU] | Freq: Once | INTRAVENOUS | Status: AC
  Administered 2015-11-12: 10 [IU] via INTRAVENOUS

## 2015-11-12 MED ORDER — INSULIN ASPART 100 UNIT/ML ~~LOC~~ SOLN
0.0000 [IU] | Freq: Three times a day (TID) | SUBCUTANEOUS | Status: DC
  Administered 2015-11-12: 2 [IU] via SUBCUTANEOUS
  Administered 2015-11-12: 9 [IU] via SUBCUTANEOUS
  Administered 2015-11-12: 3 [IU] via SUBCUTANEOUS
  Administered 2015-11-13: 7 [IU] via SUBCUTANEOUS
  Administered 2015-11-13: 3 [IU] via SUBCUTANEOUS
  Administered 2015-11-13: 9 [IU] via SUBCUTANEOUS
  Administered 2015-11-14: 1 [IU] via SUBCUTANEOUS
  Administered 2015-11-15: 9 [IU] via SUBCUTANEOUS

## 2015-11-12 MED ORDER — INSULIN DETEMIR 100 UNIT/ML ~~LOC~~ SOLN
30.0000 [IU] | Freq: Every day | SUBCUTANEOUS | Status: DC
  Administered 2015-11-12: 30 [IU] via SUBCUTANEOUS
  Filled 2015-11-12 (×2): qty 0.3

## 2015-11-12 NOTE — Procedures (Signed)
   HEMODIALYSIS TREATMENT NOTE:  4 hour low-heparin dialysis completed via right upper arm AVF (15g ante/retrograde). Goal met: 2.5 liters removed without interruption in ultrafiltration.  Low grade temp 99.7 pre HD up to 100.5 post HD.  Hemodynamically stable throughout HD session.  VAncomycin 1g given last hour of HD.  All blood was returned and hemostasis was achieved within 10 minutes. Report given to Tiffany, Therapist, sports.  Rockwell Alexandria, RN, CDN

## 2015-11-12 NOTE — Progress Notes (Signed)
   Lance [name] [id number] DOB: 24-Mar-1970 DOA: 11/12/2015 PCP: Mickie Hillier, MD  Brief narrative: 45 y/o ? 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 History of chronic diastolic heart failure PSVT Hyperlipidemia  Admitted as per history of present illness with nausea vomiting hyponatremia and hyperkalemia He has also had diarrhea States has been sick for the past 3 days with n/v R shoulder pain since 9/29 -unknown as to how this happened Took ibuprofen but the abd pain n/v started before this No cough No chills no rigors No sob Seems fairly well otherwise Notified that CBG elevated   Will follow sodium levels carefully-may need hypertonic saline if no improvement-probably 2/2 to vomiting as is hyperkalemia Control CBG aggressively Fever  Could be 2/2 to vomiting and potentitial aspiration vs diarrheal illness of gastroenteritis-no dysuria-still produces urine Will monitor on SDU  Nephrology to see  Verneita Griffes, MD [facility] (P) [phone]

## 2015-11-12 NOTE — ED Provider Notes (Signed)
[facility] DEPT Provider Note   CSN: [id number] Arrival date & time: 11/12/15  0232     History   Chief Complaint Chief Complaint  Patient presents with  . Shoulder Pain    HPI Lance [name] is a 45 y.o. male.  The history is provided by the patient.  Shoulder Pain    Patient has had pain in his right chest and shoulder since Friday after finishing dialysis. It is been dull. Worse with movement. States the hand is a week also. No fevers or chills. No difficulty breathing. States the pain is been getting worse. He has not had pain like this before. He has a dialysis access in that arm. No trauma.He states that he has had nausea vomiting diarrhea also. States he's had that for the last couple days. States that he is unable to check her sugar because of it. States he's not been able to keep his things down.  Past Medical History:  Diagnosis Date  . Aortic stenosis    Very mild in 05/2007  . CKD (chronic kidney disease) stage 3, GFR 30-59 ml/min   . Degenerative joint disease    Left TKA  . Diastolic heart failure (HCC)    LVEF 65-70% with grade 2 diastolic dysfunction  . Essential hypertension, benign 2000   LVH  . Hyperlipidemia 2000  . Pneumonia   . PSVT (paroxysmal supraventricular tachycardia) (HCC)    Nonsustained; asymptomatic; diagnosed by event recorder in 2006  . Tobacco abuse, in remission    20 pack years; discontinued in 1985; bronchitic changes on chest x-ray and 2009  . Type 1 diabetes mellitus ([place]) 1990    Patient Active Problem List   Diagnosis Date Noted  . Hypertensive urgency 05/05/2014  . Nephrotic syndrome 12/29/2013  . Hyponatremia 12/28/2013  . Acute diastolic heart failure ([place]) 12/28/2013  . Hypoglycemia 12/28/2013  . Dyspnea   . Acute renal failure syndrome ([place])   . Diabetic ketoacidosis without coma associated with type 1 diabetes mellitus ([place])   . Demand ischemia ([place]) 12/24/2013  . DKA (diabetic ketoacidoses) ([place]) 12/23/2013    . CAP (community acquired pneumonia) 12/23/2013  . Sepsis due to pneumonia ([place]) 12/23/2013  . Metabolic acidemia 96/78/9381  . Acute renal failure ([place]) 12/23/2013  . Diabetic ketoacidosis ([place]) 12/23/2013  . Chronic diastolic heart failure ([place]) 04/14/2013  . PSVT (paroxysmal supraventricular tachycardia) ([place]) 04/14/2013  . Bilateral leg edema 03/26/2013  . Chronic kidney disease, stage III (moderate) 09/29/2011  . Diabetes mellitus type 1 ([place]) 09/26/2010  . Essential hypertension, benign 09/26/2010  . Hyperlipidemia 09/26/2010    Past Surgical History:  Procedure Laterality Date  . AV FISTULA PLACEMENT Right 06/23/2014   Procedure: ARTERIOVENOUS (AV) FISTULA CREATION;  Surgeon: Angelia Mould, MD;  Location: [facility];  Service: Vascular;  Laterality: Right;  . EYE SURGERY    . HERNIA REPAIR     As a child  . WISDOM TOOTH EXTRACTION         Home Medications    Prior to Admission medications   Medication Sig Start Date End Date Taking? Authorizing Provider  amLODipine (NORVASC) 10 MG tablet TAKE 1 TABLET BY MOUTH EVERY DAY 07/18/14  Yes [name], MD  carvedilol (COREG) 25 MG tablet TAKE 2 TABLETS BY MOUTH TWICE A DAY Patient taking differently: TAKE 1 TABLET BY MOUTH TWICE A DAY 01/24/14  Yes Satira Sark, MD  insulin aspart (NOVOLOG) 100 UNIT/ML injection Inject 10 Units into the skin 3 (three)  times daily before meals. 12/29/13  Yes Delfina Redwood, MD  insulin detemir (LEVEMIR) 100 UNIT/ML injection Inject 0.2 mLs (20 Units total) into the skin at bedtime. Patient taking differently: Inject 30 Units into the skin at bedtime.  12/29/13  Yes Delfina Redwood, MD  Multiple Vitamin (MULTIVITAMIN) capsule Take 1 capsule by mouth once a week.    Yes Historical Provider, MD  azithromycin (ZITHROMAX Z-PAK) 250 MG tablet Take 2 tablets (500 mg) on  Day 1,  followed by 1 tablet (250 mg) once daily on Days 2 through 5. 07/21/14   Kathyrn Drown, MD  oxyCODONE  (ROXICODONE) 5 MG immediate release tablet Take 1 tablet (5 mg total) by mouth every 6 (six) hours as needed. 06/23/14   Samantha J Rhyne, PA-C  torsemide (DEMADEX) 20 MG tablet Take 3 tablets (60 mg total) by mouth daily. Patient taking differently: Take 60 mg by mouth 2 (two) times daily.  12/29/13   Delfina Redwood, MD    Family History Family History  Problem Relation Age of Onset  . Diabetes Father   . Hypertension Father   . Diabetes Mother   . Hypertension Mother     Social History Social History  Substance Use Topics  . Smoking status: Never Smoker  . Smokeless tobacco: Never Used  . Alcohol use No     Allergies   Atorvastatin and Minoxidil   Review of Systems Review of Systems  Constitutional: Negative for appetite change.  HENT: Negative for facial swelling.   Respiratory: Negative for shortness of breath.   Cardiovascular: Positive for chest pain.  Gastrointestinal: Negative for abdominal pain.  Genitourinary: Negative for flank pain.  Musculoskeletal: Negative for back pain.  Neurological: Positive for weakness.  Psychiatric/Behavioral: Negative for confusion.     Physical Exam Updated Vital Signs BP 127/72   Pulse 88   Temp 98.3 F (36.8 C) (Oral)   Resp 20   Ht 6\' 3"  (1.905 m)   Wt 225 lb (102.1 kg)   SpO2 98%   BMI 28.12 kg/m   Physical Exam  Constitutional: He appears well-developed.  HENT:  Head: Atraumatic.  Eyes: EOM are normal.  Neck: Neck supple.  Cardiovascular: Normal rate.   Pulmonary/Chest: Effort normal.  Abdominal: Soft.  Musculoskeletal:  Good thrill in the graft on right upper extremity. Good capillary refill and right hand. Right hand is warm. Tenderness over right shoulder. Pain with movement in right shoulder. Tenderness over clavicle and trapezius on right side. After pain medicine symptoms are much improved. Good grip strength on right side. More movement in the shoulder. Less tenderness over the trapezius and  clavicle.  Skin: Skin is warm.  Psychiatric: He has a normal mood and affect.     ED Treatments / Results  Labs (all labs ordered are listed, but only abnormal results are displayed) Labs Reviewed  COMPREHENSIVE METABOLIC PANEL - Abnormal; Notable for the following:       Result Value   Sodium 116 (*)    Potassium 6.9 (*)    Chloride 78 (*)    Glucose, Bld 721 (*)    BUN 76 (*)    Creatinine, Ser 12.35 (*)    Calcium 7.9 (*)    Albumin 3.3 (*)    ALT 16 (*)    GFR calc non Af Amer 4 (*)    GFR calc Af Amer 5 (*)    Anion gap 16 (*)    All other components within normal limits  CBC WITH DIFFERENTIAL/PLATELET - Abnormal; Notable for the following:    WBC 15.3 (*)    RBC 3.47 (*)    Hemoglobin 11.5 (*)    HCT 34.2 (*)    Neutro Abs 13.0 (*)    All other components within normal limits  TROPONIN I - Abnormal; Notable for the following:    Troponin I 0.06 (*)    All other components within normal limits  LACTIC ACID, PLASMA  LACTIC ACID, PLASMA    EKG  EKG Interpretation  Date/Time:  Sunday November 12 2015 03:01:47 EDT Ventricular Rate:  89 PR Interval:    QRS Duration: 115 QT Interval:  402 QTC Calculation: 490 R Axis:   93 Text Interpretation:  Sinus rhythm Prolonged PR interval Probable left atrial enlargement Left posterior fascicular block Nonspecific T abnrm, anterolateral leads T waves more peaked than prior. Confirmed by Alvino Chapel  MD, Ovid Curd [phone]) on 11/12/2015 3:04:39 AM       Radiology Dg Chest 2 View  Result Date: 11/12/2015 CLINICAL DATA:  Right rib pain starting yesterday, now moved to the right arm and neck. No injury EXAM: CHEST  2 VIEW COMPARISON:  05/05/2014 FINDINGS: The heart size and mediastinal contours are within normal limits. Both lungs are clear. The visualized skeletal structures are unremarkable. IMPRESSION: No active cardiopulmonary disease. Electronically Signed   By: Lucienne Capers M.D.   On: 11/12/2015 03:52   Dg Shoulder  Right  Result Date: 11/12/2015 CLINICAL DATA:  Rib pain starting yesterday, now moving to the right arm and neck. No injury. EXAM: RIGHT SHOULDER - 2+ VIEW COMPARISON:  None. FINDINGS: Degenerative changes in the acromioclavicular and glenohumeral joints with joint space narrowing and osteophyte formation. Subacromial spur formation is present. Decreased subacromial space may indicate chronic rotator cuff arthropathy. No evidence of acute fracture or dislocation of the right shoulder. Coracoclavicular spaces are normal. IMPRESSION: Degenerative changes in the right shoulder. Subacromial spurring with possible rotator cuff arthropathy. No acute bony abnormalities. Electronically Signed   By: Lucienne Capers M.D.   On: 11/12/2015 03:50    Procedures Procedures (including critical care time)  Medications Ordered in ED Medications  calcium gluconate inj 10% (1 g) URGENT USE ONLY! (not administered)  sodium chloride 0.9 % bolus 500 mL (500 mLs Intravenous New Bag/Given 11/12/15 0354)  HYDROmorphone (DILAUDID) injection 1 mg (1 mg Intravenous Given 11/12/15 0317)  ondansetron (ZOFRAN) injection 4 mg (4 mg Intravenous Given 11/12/15 0336)  insulin aspart (novoLOG) injection 6 Units (6 Units Intravenous Given 11/12/15 0355)     Initial Impression / Assessment and Plan / ED Course  I have reviewed the triage vital signs and the nursing notes.  Pertinent labs & imaging results that were available during my care of the patient were reviewed by me and considered in my medical decision making (see chart for details).  Clinical Course    Patient presents with right shoulder pain. Much improved after pain medicines. Found to have hyperkalemia hyponatremia and elevated glucose. Insulin and calcium were given for the hyperkalemia. Troponin also minimally elevated. Story is not ischemic and I think is probably just due to overall illness. Will require admission. EKG shows peaked T waves. IV fluid bolus also  given. Patient does not appear to be volume overloaded at this time. Likely has some volume depletion due to his nausea vomiting diarrhea.  Admit to internal medicine.   CRITICAL CARE Performed by: Mackie Pai Total critical care time: 30 minutes Critical care time was exclusive of  separately billable procedures and treating other patients. Critical care was necessary to treat or prevent imminent or life-threatening deterioration. Critical care was time spent personally by me on the following activities: development of treatment plan with patient and/or surrogate as well as nursing, discussions with consultants, evaluation of patient's response to treatment, examination of patient, obtaining history from patient or surrogate, ordering and performing treatments and interventions, ordering and review of laboratory studies, ordering and review of radiographic studies, pulse oximetry and re-evaluation of patient's condition.   Final Clinical Impressions(s) / ED Diagnoses   Final diagnoses:  Hyperglycemia  Hyponatremia  Hyperkalemia  End stage renal disease on dialysis [place])    New Prescriptions New Prescriptions   No medications on file     Davonna Belling, MD 11/12/15 [phone]

## 2015-11-12 NOTE — Consult Note (Signed)
Reason for Consult: Hyperkalemia and hyponatremia Referring Physician: Dr. Janeal Holmes is an 45 y.o. male.  HPI: He is a patient who has history of of hypertension, diabetes, history of mild aortic stenosis and end-stage renal disease on maintenance hemodialysis ,presently came to the emergency room with complaints of some nausea, vomiting and also right shoulder pain. When he was evaluated and was found to have hyperkalemia and hyponatremia hence admitted to the hospital. Patient is still complains of right shoulder pain with some radiation to his hand. Also he has some weakness but is seems to be getting better. He denies any nausea or vomiting. His last dialysis was on Friday.  Past Medical History:  Diagnosis Date  . Aortic stenosis    Very mild in 05/2007  . CKD (chronic kidney disease) stage 3, GFR 30-59 ml/min   . Degenerative joint disease    Left TKA  . Diastolic heart failure (HCC)    LVEF 65-70% with grade 2 diastolic dysfunction  . Essential hypertension, benign 2000   LVH  . Hyperlipidemia 2000  . Pneumonia   . PSVT (paroxysmal supraventricular tachycardia) (HCC)    Nonsustained; asymptomatic; diagnosed by event recorder in 2006  . Tobacco abuse, in remission    20 pack years; discontinued in 1985; bronchitic changes on chest x-ray and 2009  . Type 1 diabetes mellitus ([place]) 1990    Past Surgical History:  Procedure Laterality Date  . AV FISTULA PLACEMENT Right 06/23/2014   Procedure: ARTERIOVENOUS (AV) FISTULA CREATION;  Surgeon: Angelia Mould, MD;  Location: [facility];  Service: Vascular;  Laterality: Right;  . EYE SURGERY    . HERNIA REPAIR     As a child  . WISDOM TOOTH EXTRACTION      Family History  Problem Relation Age of Onset  . Diabetes Father   . Hypertension Father   . Diabetes Mother   . Hypertension Mother     Social History:  reports that he has never smoked. He has never used smokeless tobacco. He reports that he does not drink  alcohol or use drugs.  Allergies:  Allergies  Allergen Reactions  . Atorvastatin Other (See Comments)    Myalgias  . Minoxidil Other (See Comments)    Fluid retention    Medications: I have reviewed the patient's current medications.  Results for orders placed or performed during the hospital encounter of 11/12/15 (from the past 48 hour(s))  Comprehensive metabolic panel     Status: Abnormal   Collection Time: 11/12/15  3:05 AM  Result Value Ref Range   Sodium 116 (LL) 135 - 145 mmol/L    Comment: CRITICAL RESULT CALLED TO, READ BACK BY AND VERIFIED WITH:  HAYMORE,R @ 0340 ON 11/12/15 BY JUW    Potassium 6.9 (HH) 3.5 - 5.1 mmol/L    Comment: CRITICAL RESULT CALLED TO, READ BACK BY AND VERIFIED WITH:  HAYMORE,R @ 0340 ON 11/12/15 BY JUW    Chloride 78 (L) 101 - 111 mmol/L   CO2 22 22 - 32 mmol/L   Glucose, Bld 721 (HH) 65 - 99 mg/dL    Comment: CRITICAL RESULT CALLED TO, READ BACK BY AND VERIFIED WITH:  HAYMORE,R @ 0340 ON 11/12/15 BY JUW    BUN 76 (H) 6 - 20 mg/dL   Creatinine, Ser 12.35 (H) 0.61 - 1.24 mg/dL   Calcium 7.9 (L) 8.9 - 10.3 mg/dL   Total Protein 8.0 6.5 - 8.1 g/dL   Albumin 3.3 (L) 3.5 -  5.0 g/dL   AST 20 15 - 41 U/L   ALT 16 (L) 17 - 63 U/L   Alkaline Phosphatase 64 38 - 126 U/L   Total Bilirubin 0.9 0.3 - 1.2 mg/dL   GFR calc non Af Amer 4 (L) >60 mL/min   GFR calc Af Amer 5 (L) >60 mL/min    Comment: (NOTE) The eGFR has been calculated using the CKD EPI equation. This calculation has not been validated in all clinical situations. eGFR's persistently <60 mL/min signify possible Chronic Kidney Disease.    Anion gap 16 (H) 5 - 15  CBC with Differential     Status: Abnormal   Collection Time: 11/12/15  3:05 AM  Result Value Ref Range   WBC 15.3 (H) 4.0 - 10.5 K/uL   RBC 3.47 (L) 4.22 - 5.81 MIL/uL   Hemoglobin 11.5 (L) 13.0 - 17.0 g/dL   HCT 34.2 (L) 39.0 - 52.0 %   MCV 98.6 78.0 - 100.0 fL   MCH 33.1 26.0 - 34.0 pg   MCHC 33.6 30.0 - 36.0 g/dL    RDW 14.5 11.5 - 15.5 %   Platelets 175 150 - 400 K/uL   Neutrophils Relative % 84 %   Neutro Abs 13.0 (H) 1.7 - 7.7 K/uL   Lymphocytes Relative 10 %   Lymphs Abs 1.6 0.7 - 4.0 K/uL   Monocytes Relative 5 %   Monocytes Absolute 0.8 0.1 - 1.0 K/uL   Eosinophils Relative 0 %   Eosinophils Absolute 0.0 0.0 - 0.7 K/uL   Basophils Relative 0 %   Basophils Absolute 0.0 0.0 - 0.1 K/uL  Lactic acid, plasma     Status: None   Collection Time: 11/12/15  3:05 AM  Result Value Ref Range   Lactic Acid, Venous 1.6 0.5 - 1.9 mmol/L  Troponin I     Status: Abnormal   Collection Time: 11/12/15  3:05 AM  Result Value Ref Range   Troponin I 0.06 (HH) <0.03 ng/mL    Comment: CRITICAL RESULT CALLED TO, READ BACK BY AND VERIFIED WITH:  HAYMORE,R @ 0355 ON 11/12/15 BY JUW   Basic metabolic panel     Status: Abnormal   Collection Time: 11/12/15  4:50 AM  Result Value Ref Range   Sodium 118 (LL) 135 - 145 mmol/L    Comment: CRITICAL RESULT CALLED TO, READ BACK BY AND VERIFIED WITH: HAYMORE,R AT 0530 ON 10.1.17 BY MOSLEY,J    Potassium 6.2 (H) 3.5 - 5.1 mmol/L   Chloride 80 (L) 101 - 111 mmol/L   CO2 22 22 - 32 mmol/L   Glucose, Bld 595 (HH) 65 - 99 mg/dL    Comment: CRITICAL RESULT CALLED TO, READ BACK BY AND VERIFIED WITH: HAYMORE,R AT 0530 ON 10.1.17 BY MOSLEY,J    BUN 75 (H) 6 - 20 mg/dL   Creatinine, Ser 12.35 (H) 0.61 - 1.24 mg/dL   Calcium 8.2 (L) 8.9 - 10.3 mg/dL   GFR calc non Af Amer 4 (L) >60 mL/min   GFR calc Af Amer 5 (L) >60 mL/min    Comment: (NOTE) The eGFR has been calculated using the CKD EPI equation. This calculation has not been validated in all clinical situations. eGFR's persistently <60 mL/min signify possible Chronic Kidney Disease.    Anion gap 16 (H) 5 - 15  CBG monitoring, ED     Status: Abnormal   Collection Time: 11/12/15  5:00 AM  Result Value Ref Range   Glucose-Capillary 581 (HH) 65 -  99 mg/dL   Comment 1 Document in Chart   Basic metabolic panel     Status:  Abnormal   Collection Time: 11/12/15  7:15 AM  Result Value Ref Range   Sodium 121 (L) 135 - 145 mmol/L   Potassium 5.9 (H) 3.5 - 5.1 mmol/L   Chloride 83 (L) 101 - 111 mmol/L   CO2 24 22 - 32 mmol/L   Glucose, Bld 444 (H) 65 - 99 mg/dL   BUN 80 (H) 6 - 20 mg/dL   Creatinine, Ser 12.88 (H) 0.61 - 1.24 mg/dL   Calcium 8.1 (L) 8.9 - 10.3 mg/dL   GFR calc non Af Amer 4 (L) >60 mL/min   GFR calc Af Amer 5 (L) >60 mL/min    Comment: (NOTE) The eGFR has been calculated using the CKD EPI equation. This calculation has not been validated in all clinical situations. eGFR's persistently <60 mL/min signify possible Chronic Kidney Disease.    Anion gap 14 5 - 15  Troponin I     Status: Abnormal   Collection Time: 11/12/15  7:15 AM  Result Value Ref Range   Troponin I 0.07 (HH) <0.03 ng/mL    Comment: CRITICAL VALUE NOTED.  VALUE IS CONSISTENT WITH PREVIOUSLY REPORTED AND CALLED VALUE.  Glucose, capillary     Status: Abnormal   Collection Time: 11/12/15  7:36 AM  Result Value Ref Range   Glucose-Capillary 448 (H) 65 - 99 mg/dL  Glucose, capillary     Status: Abnormal   Collection Time: 11/12/15  8:27 AM  Result Value Ref Range   Glucose-Capillary 409 (H) 65 - 99 mg/dL    Dg Chest 2 View  Result Date: 11/12/2015 CLINICAL DATA:  Right rib pain starting yesterday, now moved to the right arm and neck. No injury EXAM: CHEST  2 VIEW COMPARISON:  05/05/2014 FINDINGS: The heart size and mediastinal contours are within normal limits. Both lungs are clear. The visualized skeletal structures are unremarkable. IMPRESSION: No active cardiopulmonary disease. Electronically Signed   By: Lucienne Capers M.D.   On: 11/12/2015 03:52   Dg Shoulder Right  Result Date: 11/12/2015 CLINICAL DATA:  Rib pain starting yesterday, now moving to the right arm and neck. No injury. EXAM: RIGHT SHOULDER - 2+ VIEW COMPARISON:  None. FINDINGS: Degenerative changes in the acromioclavicular and glenohumeral joints with  joint space narrowing and osteophyte formation. Subacromial spur formation is present. Decreased subacromial space may indicate chronic rotator cuff arthropathy. No evidence of acute fracture or dislocation of the right shoulder. Coracoclavicular spaces are normal. IMPRESSION: Degenerative changes in the right shoulder. Subacromial spurring with possible rotator cuff arthropathy. No acute bony abnormalities. Electronically Signed   By: Lucienne Capers M.D.   On: 11/12/2015 03:50    Review of Systems  Constitutional: Positive for malaise/fatigue. Negative for chills and fever.  Respiratory: Negative for shortness of breath.   Cardiovascular: Negative for orthopnea and leg swelling.  Gastrointestinal: Positive for diarrhea, nausea and vomiting. Negative for abdominal pain.  Musculoskeletal:       Patient came with complaints of right shoulder pain with some radiation to his hand and also some weakness  Neurological: Positive for weakness.   Blood pressure 155/72, pulse 103, temperature (!) 101.2 F (38.4 C), temperature source Oral, resp. rate (!) 28, height '6\' 3"'  (1.905 m), weight 103.5 kg (228 lb 2.8 oz), SpO2 98 %. Physical Exam  Constitutional: He is oriented to person, place, and time. No distress.  Eyes: No scleral icterus.  Neck: No JVD present.  Cardiovascular: Normal rate and regular rhythm.   No murmur heard. Respiratory: He has no wheezes. He has no rales.  GI: He exhibits no distension. There is no tenderness.  Musculoskeletal: He exhibits no edema.  Neurological: He is alert and oriented to person, place, and time.    Assessment/Plan: Problem #1 hyperkalemia: His potassium high. This is most likely from uncontrolled high potassium intake and hyperglycemia [[lack of insulin]. Presently his potassium is improving Problem #2 hyponatremia: Possibly a combination of hyperglycemia and hypovolemic hyponatremia. This is associated with diarrhea, nausea and vomiting and drinking lots  of  free water. His sodium seems to be improving Problem #3 history of end-stage renal disease: Presently his BUN and creatinine is reasonable. His last dialysis was on Friday for 4 hours Problem #4 hypertension: His blood pressure is reasonably controlled Problem #5 history of diabetes: Glucose is poorly controlled and came with severe hyperglycemia Problem #6 anemia: His hemoglobin is within target goal  Problem #7 metabolic bone disease: Calcium is range Plan: We'll make arrangements for patient to get dialysis today 2] will dialyze him for 4 hours 3] will remove 15/5 l systolic blood pressure stays above 90 4] will check renal panel and CBC in the morning. 5] patient advised to control his blood sugar.  Kiearra Oyervides S 11/12/2015, 10:02 AM

## 2015-11-12 NOTE — Progress Notes (Addendum)
Dr Verlon Au updated abnormal labs K=5.9 creat12.88 Cbg=409 na=121

## 2015-11-12 NOTE — ED Notes (Signed)
CRITICAL VALUE ALERT  Critical value received:  Na 116, potassium 6.9, glucose 721  Date of notification:  10/1/*17  Time of notification:  9037  Critical value read back:Yes.    Nurse who received alert:  Derek Mound, RN  MD notified (1st page):  Alvino Chapel  Time of first page:  [phone]  MD notified (2nd page):  Time of second page:  Responding MD:  Alvino Chapel  Time MD responded:  [phone]

## 2015-11-12 NOTE — ED Triage Notes (Signed)
Pt states rib pain started yesterday that has moved to his right arm & neck. Pt denies injury.

## 2015-11-12 NOTE — Progress Notes (Signed)
Patient vomiting now ; waiting to give neb.

## 2015-11-12 NOTE — ED Notes (Signed)
Pulse weak right radial, pt has good sensation,cap refill & warm hand. Pt has thrill in fistula.

## 2015-11-12 NOTE — H&P (Signed)
History and Physical    Lance [name] LZJ:[id number] DOB: 02/23/1970 DOA: 11/12/2015  PCP: Mickie Hillier, MD  Patient coming from: home Chief Complaint:   Shoulder pain  HPI: Lance [name] is a 45 y.o. male with medical history significant of IDDM, ESRD  Dialysis on M/W/F, HTN, CHF comes in with complaints of right shoulder pain.  Pt has been having problems moving his shoulder it is painful.  No injury to it recently.  Labs were checked and he found to have na of 116 and k of 6.9.  Pt also reports that yesterday he had several episodes on vomiting and diarrhea with no abdominal pain, this has resolved.  Denies any fevers.  He does feel overall weak.  Pt referred for admission for his hyponatremia and hyperkalemia.    Review of Systems: As per HPI otherwise 10 point review of systems negative.   Past Medical History:  Diagnosis Date  . Aortic stenosis    Very mild in 05/2007  . CKD (chronic kidney disease) stage 3, GFR 30-59 ml/min   . Degenerative joint disease    Left TKA  . Diastolic heart failure (HCC)    LVEF 65-70% with grade 2 diastolic dysfunction  . Essential hypertension, benign 2000   LVH  . Hyperlipidemia 2000  . Pneumonia   . PSVT (paroxysmal supraventricular tachycardia) (HCC)    Nonsustained; asymptomatic; diagnosed by event recorder in 2006  . Tobacco abuse, in remission    20 pack years; discontinued in 1985; bronchitic changes on chest x-ray and 2009  . Type 1 diabetes mellitus ([place]) 1990    Past Surgical History:  Procedure Laterality Date  . AV FISTULA PLACEMENT Right 06/23/2014   Procedure: ARTERIOVENOUS (AV) FISTULA CREATION;  Surgeon: Angelia Mould, MD;  Location: [facility];  Service: Vascular;  Laterality: Right;  . EYE SURGERY    . HERNIA REPAIR     As a child  . WISDOM TOOTH EXTRACTION       reports that he has never smoked. He has never used smokeless tobacco. He reports that he does not drink alcohol or use drugs.  Allergies  Allergen  Reactions  . Atorvastatin Other (See Comments)    Myalgias  . Minoxidil Other (See Comments)    Fluid retention    Family History  Problem Relation Age of Onset  . Diabetes Father   . Hypertension Father   . Diabetes Mother   . Hypertension Mother     Prior to Admission medications   Medication Sig Start Date End Date Taking? Authorizing Provider  amLODipine (NORVASC) 10 MG tablet TAKE 1 TABLET BY MOUTH EVERY DAY 07/18/14  Yes [name], MD  carvedilol (COREG) 25 MG tablet TAKE 2 TABLETS BY MOUTH TWICE A DAY Patient taking differently: TAKE 1 TABLET BY MOUTH TWICE A DAY 01/24/14  Yes Satira Sark, MD  insulin aspart (NOVOLOG) 100 UNIT/ML injection Inject 10 Units into the skin 3 (three) times daily before meals. 12/29/13  Yes Delfina Redwood, MD  insulin detemir (LEVEMIR) 100 UNIT/ML injection Inject 0.2 mLs (20 Units total) into the skin at bedtime. Patient taking differently: Inject 30 Units into the skin at bedtime.  12/29/13  Yes Delfina Redwood, MD  Multiple Vitamin (MULTIVITAMIN) capsule Take 1 capsule by mouth once a week.    Yes Historical Provider, MD  azithromycin (ZITHROMAX Z-PAK) 250 MG tablet Take 2 tablets (500 mg) on  Day 1,  followed by 1 tablet (250 mg) once  daily on Days 2 through 5. 07/21/14   Kathyrn Drown, MD  oxyCODONE (ROXICODONE) 5 MG immediate release tablet Take 1 tablet (5 mg total) by mouth every 6 (six) hours as needed. 06/23/14   Samantha J Rhyne, PA-C  torsemide (DEMADEX) 20 MG tablet Take 3 tablets (60 mg total) by mouth daily. Patient taking differently: Take 60 mg by mouth 2 (two) times daily.  12/29/13   Delfina Redwood, MD    Physical Exam: Vitals:   11/12/15 0238 11/12/15 0251 11/12/15 0401  BP:  127/72 146/68  Pulse:  88 91  Resp:  20 18  Temp:  98.3 F (36.8 C)   TempSrc:  Oral   SpO2:  98% 93%  Weight: 102.1 kg (225 lb)    Height: 6\' 3"  (1.905 m)     Constitutional: NAD, calm, comfortable  MMM dry Vitals:   11/12/15  0238 11/12/15 0251 11/12/15 0401  BP:  127/72 146/68  Pulse:  88 91  Resp:  20 18  Temp:  98.3 F (36.8 C)   TempSrc:  Oral   SpO2:  98% 93%  Weight: 102.1 kg (225 lb)    Height: 6\' 3"  (1.905 m)     Eyes: PERRL, lids and conjunctivae normal ENMT: Mucous membranes are moist. Posterior pharynx clear of any exudate or lesions.Normal dentition.  Neck: normal, supple, no masses, no thyromegaly Respiratory: clear to auscultation bilaterally, no wheezing, no crackles. Normal respiratory effort. No accessory muscle use.  Cardiovascular: Regular rate and rhythm, no murmurs / rubs / gallops. No extremity edema. 2+ pedal pulses. No carotid bruits.  Abdomen: no tenderness, no masses palpated. No hepatosplenomegaly. Bowel sounds positive.  Musculoskeletal: no clubbing / cyanosis. No joint deformity upper and lower extremities. Good ROM, no contractures. Normal muscle tone.  Limited ROM right shoulder due to pain Skin: no rashes, lesions, ulcers. No induration Neurologic: CN 2-12 grossly intact. Sensation intact, DTR normal. Strength 5/5 in all 4.  Psychiatric: Normal judgment and insight. Alert and oriented x 3. Normal mood.    Labs on Admission: I have personally reviewed following labs and imaging studies  CBC:  Recent Labs Lab 11/12/15 0305  WBC 15.3*  NEUTROABS 13.0*  HGB 11.5*  HCT 34.2*  MCV 98.6  PLT 510   Basic Metabolic Panel:  Recent Labs Lab 11/12/15 0305  NA 116*  K 6.9*  CL 78*  CO2 22  GLUCOSE 721*  BUN 76*  CREATININE 12.35*  CALCIUM 7.9*   GFR: Estimated Creatinine Clearance: 9.7 mL/min (by C-G formula based on SCr of 12.35 mg/dL (H)). Liver Function Tests:  Recent Labs Lab 11/12/15 0305  AST 20  ALT 16*  ALKPHOS 64  BILITOT 0.9  PROT 8.0  ALBUMIN 3.3*   Cardiac Enzymes:  Recent Labs Lab 11/12/15 0305  TROPONINI 0.06*   CBG:  Recent Labs Lab 11/12/15 0500  GLUCAP 581*   Urine analysis:    Component Value Date/Time   COLORURINE  YELLOW 05/05/2014 0904   APPEARANCEUR CLEAR 05/05/2014 0904   LABSPEC 1.020 05/05/2014 0904   PHURINE 6.5 05/05/2014 0904   GLUCOSEU 100 (A) 05/05/2014 0904   HGBUR LARGE (A) 05/05/2014 0904   BILIRUBINUR NEGATIVE 05/05/2014 0904   KETONESUR NEGATIVE 05/05/2014 0904   PROTEINUR >300 (A) 05/05/2014 0904   UROBILINOGEN 0.2 05/05/2014 0904   NITRITE NEGATIVE 05/05/2014 0904   LEUKOCYTESUR NEGATIVE 05/05/2014 0904   Radiological Exams on Admission: Dg Chest 2 View  Result Date: 11/12/2015 CLINICAL DATA:  Right rib  pain starting yesterday, now moved to the right arm and neck. No injury EXAM: CHEST  2 VIEW COMPARISON:  05/05/2014 FINDINGS: The heart size and mediastinal contours are within normal limits. Both lungs are clear. The visualized skeletal structures are unremarkable. IMPRESSION: No active cardiopulmonary disease. Electronically Signed   By: Lucienne Capers M.D.   On: 11/12/2015 03:52   Dg Shoulder Right  Result Date: 11/12/2015 CLINICAL DATA:  Rib pain starting yesterday, now moving to the right arm and neck. No injury. EXAM: RIGHT SHOULDER - 2+ VIEW COMPARISON:  None. FINDINGS: Degenerative changes in the acromioclavicular and glenohumeral joints with joint space narrowing and osteophyte formation. Subacromial spur formation is present. Decreased subacromial space may indicate chronic rotator cuff arthropathy. No evidence of acute fracture or dislocation of the right shoulder. Coracoclavicular spaces are normal. IMPRESSION: Degenerative changes in the right shoulder. Subacromial spurring with possible rotator cuff arthropathy. No acute bony abnormalities. Electronically Signed   By: Lucienne Capers M.D.   On: 11/12/2015 03:50    EKG: Independently reviewed. nsr peaked diffuse t waves  cxr reviewed no edema or infiltrate  Assessment/Plan 45 yo male ESRD on dialysis comes in with right shoulder pain incidentally found to have hyponatremia and hyperkalemia  Principal Problem:    Hyponatremia- given one liter of ivf bolus in ED.  Likely from volume losses with recent gi illness, seizure precautions. Cont gentle ivf pending nephro recommendations  Active Problems:   ESRD (end stage renal disease) on dialysis [facility])- consult nephrology   Hyperkalemia- given calcium gluconate, insulin in the ED, repeat k down to 6.2.  Repeat ca gluconate and insulin.  Monitor in [facility]   Chronic diastolic heart failure ([place])- noted   Diabetes mellitus type 1 ([place])- give another 10 units insulin, ssi   Essential hypertension, benign- stable   Shoulder pain, acute   Nausea & vomiting- resolved, abd exam benign. lfts normal   Diarrhea     DVT prophylaxis: scds  Code Status:  full Family Communication:  none Disposition Plan:  Per day team Consults called:  nephro Admission status:  admit   DAVID,RACHAL A MD [facility]  If 7PM-7AM, please contact night-coverage [email or web address] Password [email or web address]  11/12/2015, 5:06 AM

## 2015-11-12 NOTE — Progress Notes (Signed)
Pharmacy Antibiotic Note  Lance [name] is a 44 y.o. male admitted on 11/12/2015 with aspiration pneumonia/bacteremia.  Pharmacy has been consulted for Vancomycin and Zosyn dosing. HD patient  Plan: Vancomycin 1500 mg IV loading dose, then Vancomycin 1 GM IV after each dialysis Zosyn 2.25 GM IV every 8 hours Labs per protocol F/U Dialysis days  Height: 6\' 3"  (190.5 cm) Weight: 228 lb 2.8 oz (103.5 kg) IBW/kg (Calculated) : 84.5  Temp (24hrs), Avg:100.9 F (38.3 C), Min:98.3 F (36.8 C), Max:102.3 F (39.1 C)   Recent Labs Lab 11/12/15 0305 11/12/15 0450 11/12/15 0715  WBC 15.3*  --   --   CREATININE 12.35* 12.35* 12.88*  LATICACIDVEN 1.6  --   --     Estimated Creatinine Clearance: 9.3 mL/min (by C-G formula based on SCr of 12.88 mg/dL (H)).    Allergies  Allergen Reactions  . Atorvastatin Other (See Comments)    Myalgias  . Minoxidil Other (See Comments)    Fluid retention    Antimicrobials this admission: Vancomycin 10/1 >>  Zosyn 10/1 >>   Dose adjustments this admission:   Microbiology results:  BCx:   UCx:    Sputum:    MRSA PCR:   Thank you for allowing pharmacy to be a part of this patient's care.  Chriss Czar 11/12/2015 12:17 PM

## 2015-11-12 NOTE — ED Notes (Signed)
CRITICAL VALUE ALERT  Critical value received:  Troponin 0.06  Date of notification:  11/12/15  Time of notification:  0175  Critical value read back:Yes.    Nurse who received alert:  Derek Mound, RN  MD notified (1st page):  Alvino Chapel  Time of first page:  [phone]  MD notified (2nd page):  Time of second page:  Responding MD:  Alvino Chapel  Time MD responded:  [phone]

## 2015-11-12 NOTE — ED Notes (Signed)
CRITICAL VALUE ALERT  Critical value received:  Na 118, Potassium 6.2, Glucose 595  Date of notification:  11/12/15  Time of notification:  9980  Critical value read back:Yes.    Nurse who received alert:  Derek Mound, RN  MD notified (1st page):  Shanon Brow  Time of first page:  0532  MD notified (2nd page):  Time of second page:  Responding MD:  Shanon Brow  Time MD responded:  [phone]

## 2015-11-12 NOTE — ED Notes (Signed)
Pt had an episode of vomiting

## 2015-11-12 NOTE — ED Notes (Signed)
CRITICAL VALUE ALERT  Critical value received:  CBG 581  Date of notification:  11/12/15  Time of notification:  0502  Critical value read back:Yes.    Nurse who received alert:  Derek Mound, RN  MD notified (1st page):  Alvino Chapel  Time of first page:  0501  MD notified (2nd page):  Time of second page:  Responding MD:  Alvino Chapel  Time MD responded:  501

## 2015-11-13 ENCOUNTER — Inpatient Hospital Stay (HOSPITAL_COMMUNITY): Payer: BLUE CROSS/BLUE SHIELD

## 2015-11-13 DIAGNOSIS — R509 Fever, unspecified: Secondary | ICD-10-CM

## 2015-11-13 DIAGNOSIS — E871 Hypo-osmolality and hyponatremia: Secondary | ICD-10-CM

## 2015-11-13 LAB — RENAL FUNCTION PANEL
ALBUMIN: 2.6 g/dL — AB (ref 3.5–5.0)
ALBUMIN: 2.7 g/dL — AB (ref 3.5–5.0)
ANION GAP: 13 (ref 5–15)
ANION GAP: 13 (ref 5–15)
BUN: 38 mg/dL — AB (ref 6–20)
BUN: 52 mg/dL — AB (ref 6–20)
CALCIUM: 7.8 mg/dL — AB (ref 8.9–10.3)
CO2: 24 mmol/L (ref 22–32)
CO2: 27 mmol/L (ref 22–32)
Calcium: 8.1 mg/dL — ABNORMAL LOW (ref 8.9–10.3)
Chloride: 90 mmol/L — ABNORMAL LOW (ref 101–111)
Chloride: 90 mmol/L — ABNORMAL LOW (ref 101–111)
Creatinine, Ser: 8.57 mg/dL — ABNORMAL HIGH (ref 0.61–1.24)
Creatinine, Ser: 10.21 mg/dL — ABNORMAL HIGH (ref 0.61–1.24)
GFR calc Af Amer: 8 mL/min — ABNORMAL LOW (ref >60)
GFR calc non Af Amer: 5 mL/min — ABNORMAL LOW (ref >60)
GFR, EST AFRICAN AMERICAN: 6 mL/min — AB (ref >60)
GFR, EST NON AFRICAN AMERICAN: 7 mL/min — AB (ref >60)
Glucose, Bld: 354 mg/dL — ABNORMAL HIGH (ref 65–99)
Glucose, Bld: 280 mg/dL — ABNORMAL HIGH (ref 65–99)
PHOSPHORUS: 7.1 mg/dL — AB (ref 2.5–4.6)
PHOSPHORUS: 7.8 mg/dL — AB (ref 2.5–4.6)
POTASSIUM: 4.6 mmol/L (ref 3.5–5.1)
POTASSIUM: 4.5 mmol/L (ref 3.5–5.1)
SODIUM: 127 mmol/L — AB (ref 135–145)
SODIUM: 130 mmol/L — AB (ref 135–145)

## 2015-11-13 LAB — CBC
HEMATOCRIT: 31.6 % — AB (ref 39.0–52.0)
HEMATOCRIT: 32.1 % — AB (ref 39.0–52.0)
HEMOGLOBIN: 10.6 g/dL — AB (ref 13.0–17.0)
Hemoglobin: 10.8 g/dL — ABNORMAL LOW (ref 13.0–17.0)
MCH: 33 pg (ref 26.0–34.0)
MCH: 33.1 pg (ref 26.0–34.0)
MCHC: 33.5 g/dL (ref 30.0–36.0)
MCHC: 33.6 g/dL (ref 30.0–36.0)
MCV: 98.4 fL (ref 78.0–100.0)
MCV: 98.5 fL (ref 78.0–100.0)
Platelets: 147 10*3/uL — ABNORMAL LOW (ref 150–400)
Platelets: 150 10*3/uL (ref 150–400)
RBC: 3.21 MIL/uL — ABNORMAL LOW (ref 4.22–5.81)
RBC: 3.26 MIL/uL — ABNORMAL LOW (ref 4.22–5.81)
RDW: 14.8 % (ref 11.5–15.5)
RDW: 15 % (ref 11.5–15.5)
WBC: 13.7 10*3/uL — AB (ref 4.0–10.5)
WBC: 12.2 10*3/uL — AB (ref 4.0–10.5)

## 2015-11-13 LAB — C-REACTIVE PROTEIN: CRP: 35.4 mg/dL — ABNORMAL HIGH (ref <1.0)

## 2015-11-13 LAB — BLOOD CULTURE ID PANEL (REFLEXED)
Acinetobacter baumannii: NOT DETECTED
CANDIDA ALBICANS: NOT DETECTED
CANDIDA GLABRATA: NOT DETECTED
CANDIDA PARAPSILOSIS: NOT DETECTED
Candida krusei: NOT DETECTED
Candida tropicalis: NOT DETECTED
Carbapenem resistance: NOT DETECTED
ENTEROBACTER CLOACAE COMPLEX: NOT DETECTED
ENTEROBACTERIACEAE SPECIES: NOT DETECTED
ENTEROCOCCUS SPECIES: NOT DETECTED
ESCHERICHIA COLI: NOT DETECTED
HAEMOPHILUS INFLUENZAE: NOT DETECTED
KLEBSIELLA PNEUMONIAE: NOT DETECTED
Klebsiella oxytoca: NOT DETECTED
Listeria monocytogenes: NOT DETECTED
METHICILLIN RESISTANCE: NOT DETECTED
Neisseria meningitidis: NOT DETECTED
PSEUDOMONAS AERUGINOSA: NOT DETECTED
Proteus species: NOT DETECTED
STAPHYLOCOCCUS AUREUS BCID: NOT DETECTED
STREPTOCOCCUS AGALACTIAE: NOT DETECTED
STREPTOCOCCUS PNEUMONIAE: DETECTED — AB
Serratia marcescens: NOT DETECTED
Staphylococcus species: NOT DETECTED
Streptococcus pyogenes: NOT DETECTED
Streptococcus species: DETECTED — AB
VANCOMYCIN RESISTANCE: NOT DETECTED

## 2015-11-13 LAB — BASIC METABOLIC PANEL
ANION GAP: 16 — AB (ref 5–15)
ANION GAP: 13 (ref 5–15)
BUN: 34 mg/dL — ABNORMAL HIGH (ref 6–20)
BUN: 38 mg/dL — ABNORMAL HIGH (ref 6–20)
CALCIUM: 7.9 mg/dL — AB (ref 8.9–10.3)
CHLORIDE: 90 mmol/L — AB (ref 101–111)
CO2: 25 mmol/L (ref 22–32)
CO2: 22 mmol/L (ref 22–32)
Calcium: 7.8 mg/dL — ABNORMAL LOW (ref 8.9–10.3)
Chloride: 90 mmol/L — ABNORMAL LOW (ref 101–111)
Creatinine, Ser: 8.03 mg/dL — ABNORMAL HIGH (ref 0.61–1.24)
Creatinine, Ser: 8.6 mg/dL — ABNORMAL HIGH (ref 0.61–1.24)
GFR calc non Af Amer: 7 mL/min — ABNORMAL LOW (ref >60)
GFR, EST AFRICAN AMERICAN: 8 mL/min — AB (ref >60)
GFR, EST AFRICAN AMERICAN: 8 mL/min — AB (ref >60)
GFR, EST NON AFRICAN AMERICAN: 7 mL/min — AB (ref >60)
GLUCOSE: 332 mg/dL — AB (ref 65–99)
Glucose, Bld: 354 mg/dL — ABNORMAL HIGH (ref 65–99)
POTASSIUM: 4.6 mmol/L (ref 3.5–5.1)
Potassium: 4.4 mmol/L (ref 3.5–5.1)
Sodium: 128 mmol/L — ABNORMAL LOW (ref 135–145)
Sodium: 128 mmol/L — ABNORMAL LOW (ref 135–145)

## 2015-11-13 LAB — GLUCOSE, CAPILLARY
GLUCOSE-CAPILLARY: 330 mg/dL — AB (ref 65–99)
GLUCOSE-CAPILLARY: 125 mg/dL — AB (ref 65–99)
Glucose-Capillary: 383 mg/dL — ABNORMAL HIGH (ref 65–99)
Glucose-Capillary: 213 mg/dL — ABNORMAL HIGH (ref 65–99)

## 2015-11-13 LAB — POTASSIUM: Potassium: 4.8 mmol/L (ref 3.5–5.1)

## 2015-11-13 LAB — SEDIMENTATION RATE: Sed Rate: 120 mm/hr — ABNORMAL HIGH (ref 0–16)

## 2015-11-13 MED ORDER — SEVELAMER CARBONATE 800 MG PO TABS
1600.0000 mg | ORAL_TABLET | Freq: Three times a day (TID) | ORAL | Status: DC
  Administered 2015-11-13 – 2015-11-16 (×9): 1600 mg via ORAL
  Filled 2015-11-13 (×9): qty 2

## 2015-11-13 MED ORDER — INSULIN ASPART 100 UNIT/ML ~~LOC~~ SOLN
12.0000 [IU] | Freq: Three times a day (TID) | SUBCUTANEOUS | Status: DC
  Administered 2015-11-13 – 2015-11-16 (×4): 12 [IU] via SUBCUTANEOUS

## 2015-11-13 MED ORDER — HEPARIN SODIUM (PORCINE) 1000 UNIT/ML DIALYSIS: 20.0000 [IU]/kg | INTRAMUSCULAR | Status: DC | PRN

## 2015-11-13 MED ORDER — INSULIN DETEMIR 100 UNIT/ML ~~LOC~~ SOLN
35.0000 [IU] | Freq: Every day | SUBCUTANEOUS | Status: DC
  Administered 2015-11-13 – 2015-11-14 (×2): 35 [IU] via SUBCUTANEOUS
  Filled 2015-11-13 (×3): qty 0.35

## 2015-11-13 MED ORDER — DICLOFENAC SODIUM 1 % TD GEL
TRANSDERMAL | Status: AC
  Filled 2015-11-13: qty 100

## 2015-11-13 MED ORDER — HEPARIN SODIUM (PORCINE) 1000 UNIT/ML DIALYSIS: 1000.0000 [IU] | INTRAMUSCULAR | Status: DC | PRN

## 2015-11-13 MED ORDER — ALTEPLASE 2 MG IJ SOLR: 2.0000 mg | Freq: Once | INTRAMUSCULAR | Status: DC | PRN

## 2015-11-13 MED ORDER — SODIUM CHLORIDE 0.9 % IV SOLN: 100.0000 mL | INTRAVENOUS | Status: DC | PRN

## 2015-11-13 MED ORDER — DEXTROSE 5 % IV SOLN
2.0000 g | INTRAVENOUS | Status: DC
  Administered 2015-11-13 – 2015-11-14 (×2): 2 g via INTRAVENOUS
  Filled 2015-11-13 (×3): qty 2

## 2015-11-13 NOTE — Progress Notes (Signed)
CRITICAL VALUE ALERT  Critical value received:  6063  Date of notification:  11/13/15  Time of notification:  0315  Critical value read back:yes  Nurse who received alert:  Gerome Sam  MD notified (1st page): [phone]  Time of first page:  0315  MD notified (2nd page):  Time of second page:  Responding MD: Opyd   Time MD responded:  1093  3:28 AM  11/13/15  Meliyah Simon Rica Mote, RN

## 2015-11-13 NOTE — Progress Notes (Signed)
*  PRELIMINARY RESULTS* Echocardiogram 2D Echocardiogram has been performed.  Lance [name] 11/13/2015, 10:39 AM

## 2015-11-13 NOTE — Consult Note (Signed)
Reason for Consult:right shoulder pain  Referring Physician: Phyllis Whitefield is an 45 y.o. male.  HPI: 45 years old male multiple medical problems as listed below chronic renal failure on dialysis presented to the hospital right shoulder pain found to have pneumonia. X-ray of the shoulder showed glenohumeral joint arthritis before meals joint arthritis. MRI was done read as rotator cuff tear however, poor visualization of the before meals joint  Recommend repeat MRI with concentration over the acromioclavicular joint. Question if contrast can be used in this setting. Would be helpful  Mr. Joslyn indicates he had no shoulder problems until about 3 or 4 days ago when he started having severe aching shoulder pain over the "top bone" with chest pain loss of motion and inability to raise his right arm. He had trouble lying down or stop laying on his right side. Prior to that he had normal function in the arm. No pain.    Past Medical History:  Diagnosis Date  . Aortic stenosis    Very mild in 05/2007  . CKD (chronic kidney disease) stage 3, GFR 30-59 ml/min   . Degenerative joint disease    Left TKA  . Diastolic heart failure (HCC)    LVEF 65-70% with grade 2 diastolic dysfunction  . Essential hypertension, benign 2000   LVH  . Hyperlipidemia 2000  . Pneumonia   . PSVT (paroxysmal supraventricular tachycardia) (HCC)    Nonsustained; asymptomatic; diagnosed by event recorder in 2006  . Tobacco abuse, in remission    20 pack years; discontinued in 1985; bronchitic changes on chest x-ray and 2009  . Type 1 diabetes mellitus ([place]) 1990    Past Surgical History:  Procedure Laterality Date  . AV FISTULA PLACEMENT Right 06/23/2014   Procedure: ARTERIOVENOUS (AV) FISTULA CREATION;  Surgeon: Angelia Mould, MD;  Location: [facility];  Service: Vascular;  Laterality: Right;  . EYE SURGERY    . HERNIA REPAIR     As a child  . WISDOM TOOTH EXTRACTION      Family History  Problem  Relation Age of Onset  . Diabetes Father   . Hypertension Father   . Diabetes Mother   . Hypertension Mother     Social History:  reports that he has never smoked. He has never used smokeless tobacco. He reports that he does not drink alcohol or use drugs.  Allergies:  Allergies  Allergen Reactions  . Atorvastatin Other (See Comments)    Myalgias  . Minoxidil Other (See Comments)    Fluid retention    Medications: I have reviewed the patient's current medications.  Meds ordered this encounter  Medications  . HYDROmorphone (DILAUDID) injection 1 mg  . ondansetron (ZOFRAN) injection 4 mg  . insulin aspart (novoLOG) injection 6 Units  . calcium gluconate inj 10% (1 g) URGENT USE ONLY!  Marland Kitchen sodium chloride 0.9 % bolus 500 mL  . sodium polystyrene (KAYEXALATE) 15 GM/60ML suspension 30 g  . amLODipine (NORVASC) tablet 10 mg  . carvedilol (COREG) tablet 25 mg  . DISCONTD: insulin aspart (novoLOG) injection 10 Units  . DISCONTD: insulin detemir (LEVEMIR) injection 30 Units  . sodium chloride flush (NS) 0.9 % injection 3 mL  . sodium chloride flush (NS) 0.9 % injection 3 mL  . sodium chloride flush (NS) 0.9 % injection 3 mL  . 0.9 %  sodium chloride infusion  . OR Linked Order Group   . ondansetron (ZOFRAN) tablet 4 mg   . ondansetron (ZOFRAN) injection  4 mg  . insulin aspart (novoLOG) injection 0-9 Units    Order Specific Question:   Correction coverage:    Answer:   Sensitive (thin, NPO, renal)    Order Specific Question:   CBG < 70:    Answer:   implement hypoglycemia protocol    Order Specific Question:   CBG 70 - 120:    Answer:   0 units    Order Specific Question:   CBG 121 - 150:    Answer:   1 unit    Order Specific Question:   CBG 151 - 200:    Answer:   2 units    Order Specific Question:   CBG 201 - 250:    Answer:   3 units    Order Specific Question:   CBG 251 - 300:    Answer:   5 units    Order Specific Question:   CBG 301 - 350:    Answer:   7 units     Order Specific Question:   CBG 351 - 400    Answer:   9 units    Order Specific Question:   CBG > 400    Answer:   call MD and obtain STAT lab verification  . DISCONTD: dextrose 5 %-0.45 % sodium chloride infusion  . DISCONTD: insulin regular (NOVOLIN R,HUMULIN R) 250 Units in sodium chloride 0.9 % 250 mL (1 Units/mL) infusion    Order Specific Question:   GlucoStabilizer low target:    Answer:   140    Order Specific Question:   GlucoStabilizer high target:    Answer:   180    Order Specific Question:   Soil scientist:    Answer:   0.01  . insulin aspart (novoLOG) injection 10 Units  . calcium gluconate inj 10% (1 g) URGENT USE ONLY!  Marland Kitchen albuterol (PROVENTIL) (2.5 MG/3ML) 0.083% nebulizer solution 5 mg  . 0.9 %  sodium chloride infusion  . HYDROmorphone (DILAUDID) injection 1 mg  . pentafluoroprop-tetrafluoroeth (GEBAUERS) aerosol 1 application  . lidocaine (PF) (XYLOCAINE) 1 % injection 5 mL  . lidocaine-prilocaine (EMLA) cream 1 application  . 0.9 %  sodium chloride infusion  . 0.9 %  sodium chloride infusion  . heparin injection 2,100 Units  . diclofenac sodium (VOLTAREN) 1 % transdermal gel 4 g  . DISCONTD: piperacillin-tazobactam (ZOSYN) IVPB 2.25 g    Order Specific Question:   Antibiotic Indication:    Answer:   Aspiration Pneumonia  . vancomycin (VANCOCIN) 1,500 mg in sodium chloride 0.9 % 500 mL IVPB    Order Specific Question:   Indication:    Answer:   Bacteremia  . DISCONTD: piperacillin-tazobactam (ZOSYN) 2.25 g in dextrose 5 % 50 mL IVPB    Order Specific Question:   Antibiotic Indication:    Answer:   Aspiration Pneumonia  . vancomycin (VANCOCIN) IVPB 1000 mg/200 mL premix    Order Specific Question:   Indication:    Answer:   Bacteremia  . acetaminophen (TYLENOL) tablet 500 mg  . DISCONTD: traMADol (ULTRAM) tablet 100 mg  . traMADol (ULTRAM) tablet 100 mg  . ibuprofen (ADVIL,MOTRIN) tablet 400 mg  . sevelamer carbonate (RENVELA) tablet 1,600 mg  .  0.9 %  sodium chloride infusion  . 0.9 %  sodium chloride infusion  . heparin injection 1,000 Units  . alteplase (CATHFLO ACTIVASE) injection 2 mg  . heparin injection 2,100 Units  . cefTRIAXone (ROCEPHIN) 2 g in dextrose 5 % 50 mL IVPB  Order Specific Question:   Antibiotic Indication:    Answer:   Bacteremia  . insulin detemir (LEVEMIR) injection 35 Units  . insulin aspart (novoLOG) injection 12 Units     Dg Chest 2 View  Result Date: 11/12/2015 CLINICAL DATA:  Right rib pain starting yesterday, now moved to the right arm and neck. No injury EXAM: CHEST  2 VIEW COMPARISON:  05/05/2014 FINDINGS: The heart size and mediastinal contours are within normal limits. Both lungs are clear. The visualized skeletal structures are unremarkable. IMPRESSION: No active cardiopulmonary disease. Electronically Signed   By: Lucienne Capers M.D.   On: 11/12/2015 03:52   Dg Shoulder Right  Result Date: 11/12/2015 CLINICAL DATA:  Rib pain starting yesterday, now moving to the right arm and neck. No injury. EXAM: RIGHT SHOULDER - 2+ VIEW COMPARISON:  None. FINDINGS: Degenerative changes in the acromioclavicular and glenohumeral joints with joint space narrowing and osteophyte formation. Subacromial spur formation is present. Decreased subacromial space may indicate chronic rotator cuff arthropathy. No evidence of acute fracture or dislocation of the right shoulder. Coracoclavicular spaces are normal. IMPRESSION: Degenerative changes in the right shoulder. Subacromial spurring with possible rotator cuff arthropathy. No acute bony abnormalities. Electronically Signed   By: Lucienne Capers M.D.   On: 11/12/2015 03:50   Mr Cervical Spine Wo Contrast  Result Date: 11/13/2015 CLINICAL DATA:  45 year old diabetic hypertensive male with neck pain extending to right shoulder 2 weeks. Anemia. No known injury. EXAM: MRI CERVICAL SPINE WITHOUT CONTRAST TECHNIQUE: Multiplanar, multisequence MR imaging of the cervical  spine was performed. No intravenous contrast was administered. COMPARISON:  None. FINDINGS: Alignment: Normal. Vertebrae: Decreased signal intensity of bone marrow may be related to habitus or patient's underlying anemia. No focal osseous abnormality. 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 Posterior Fossa, vertebral arteries, paraspinal tissues: Slightly prominent size left level 2 lymph nodes measuring up to 1.2 cm short axis dimension of questionable significance/ etiology. Disc levels: C2-3:  Negative. C3-4:  Negative. C4-5: Minimal right uncinate hypertrophy with minimal right foraminal narrowing. C5-6:  Negative. C6-7: Mild bulge/shallow protrusion right paracentral position with slight indentation right ventral thecal sac approaching but not compressing the right ventral nerve root. C7-T1:  Mild facet degenerative changes. IMPRESSION: C6-7 mild bulge/shallow protrusion right paracentral position with slight indentation right ventral thecal sac approaching but not compressing the right ventral nerve root. C4-5 minimal right foraminal narrowing. 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 Decreased signal intense of bone marrow may be related to patient's habitus or underlying anemia. Slightly prominent size left level 2 lymph nodes measuring up to 1.2 cm short axis dimension of questionable significance or etiology. Electronically Signed   By: Genia Del M.D.   On: 11/13/2015 10:05   Mr Shoulder Right Wo Contrast  Result Date: 11/13/2015 CLINICAL DATA:  Neck pain radiating to the right shoulder. Pain for 2 weeks. EXAM: MRI OF THE RIGHT SHOULDER WITHOUT CONTRAST TECHNIQUE: Multiplanar, multisequence MR imaging of the shoulder was performed. No intravenous contrast was administered. COMPARISON:  None. FINDINGS: Rotator cuff: Complete tear of the supraspinatus tendon with 3.5 cm of retraction Infraspinatus tendon is intact.  Teres minor tendon is intact. Subscapularis tendon is intact. Muscles: No atrophy or fatty replacement of nor abnormal signal within, the muscles of the rotator cuff. Biceps long head: Mild tendinosis of the intraarticular portion of the long head of the biceps tendon. Acromioclavicular  Joint: Mild arthropathy of the acromioclavicular joint. Type I acromion. No subacromial/subdeltoid bursal fluid. Glenohumeral Joint: No joint effusion. No chondral defect. Labrum: Grossly intact, but evaluation is limited by lack of intraarticular fluid. Bones: Subcortical reactive marrow changes at the infraspinatus insertion. No other marrow abnormality, fracture or dislocation. Other: None. IMPRESSION: 1. Complete tear of the supraspinatus tendon with 3.5 cm of retraction 2. Mild tendinosis of the intraarticular portion of the long head of the biceps tendon. Electronically Signed   By: Kathreen Devoid   On: 11/13/2015 09:57    Review of Systems  Constitutional: Positive for chills, fever and malaise/fatigue.  Respiratory: Positive for shortness of breath.   Cardiovascular: Negative for chest pain.  Neurological: Negative for tingling.   Blood pressure 116/80, pulse 83, temperature 97.2 F (36.2 C), temperature source Oral, resp. rate 16, height _0  (1.905 m), weight 226 lb 13.7 oz (102.9 kg), SpO2 96 %. Physical Exam The patient is otherwise normal development grooming hygiene are normal  He's comfortable in bed.  He is awake alert and oriented person place and time  Mood and affect are normal.  He's lying in bed.  Gait not assessed  Passive range of motion of the right shoulder: External rotation 40 Painful abduction at 80, active abduction 60 No tenderness over the rotator interval or lateral deltoid, tenderness over the acromioclavicular joint. Neither joint is warm to touch. The right before meals joint appear swollen.  Right shoulder stable to inferior subluxation test. Not tested in abduction  external rotation because of pain and motion loss. Obvious weakness in abduction is primarily pain related. External rotators and internal rotators manual muscle testing grade 5 strength  Normal sensation in the right arm normal pulse. There is no axillary lymph node enlargement.  Plain films again show arthritis of the shoulder joint and the acromioclavicular joint.  The MRI seemed to me to have motion artifact although rotator cuff tear could be visualized glenohumeral joint arthritis could be visualized. There appeared to be fluid in the acromioclavicular joint but it is cut off on several of the images  I think it needs to be repeated with contrast if possible.   Assessment/Plan: Acromioclavicular joint inflammation, arthritis Asymptomatic rotator cuff tear  Repeat MRI with and without contrast if possible  CBC Latest Ref Rng & Units 11/13/2015 11/13/2015 11/12/2015  WBC 4.0 - 10.5 K/uL 12.2(H) 13.7(H) 15.3(H)  Hemoglobin 13.0 - 17.0 g/dL 10.8(L) 10.6(L) 11.5(L)  Hematocrit 39.0 - 52.0 % 32.1(L) 31.6(L) 34.2(L)  Platelets 150 - 400 K/uL 150 147(L) 175   Esr crp ?   Arther Abbott 11/13/2015, 5:33 PM

## 2015-11-13 NOTE — Progress Notes (Signed)
Lance [name] GQQ:[id number] DOB: 1970-11-05 DOA: 11/12/2015 PCP: Mickie Hillier, MD  Brief narrative: Brief narrative: 45 y/o ? 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 History of chronic diastolic heart failure PSVT Hyperlipidemia  Admitted as per history of present illness with nausea vomiting hyponatremia and hyperkalemia He has also had diarrhea States has been sick for the past 3 days with n/v R shoulder pain since 9/29 -unknown as to how this happened-the patient has never had a prior history of gout nor any arthritide Took ibuprofen but the abd pain n/v started before this No cough No chills no rigors No sob Seems fairly well otherwise Notified that CBG elevated   Past medical history-As per Problem list Chart reviewed as below-   Consultants:  Orthopedics Dr Aline Brochure  Procedures:   none  Antibiotics:   Vacnomycin  ZOsyn   Subjective  Still having persisting shoulder pain despite nonsteroidals, tramadol and cream Tolerating diet fairly well Feels maybe a little bit at her No chest pain No vomiting No further diarrhea   Objective    Interim History:   Telemetry: Sinus rhythm   Objective: Vitals:   11/13/15 0100 11/13/15 0400 11/13/15 0500 11/13/15 0803  BP: 106/61     Pulse: 85     Resp: 16     Temp:  97.9 F (36.6 C)  98.1 F (36.7 C)  TempSrc:  Oral  Oral  SpO2: 100%     Weight:   102.9 kg (226 lb 13.7 oz)   Height:        Intake/Output Summary (Last 24 hours) at 11/13/15 6712 Last data filed at 11/12/15 2035  Gross per 24 hour  Intake           753.75 ml  Output             2700 ml  Net         -1946.25 ml    Exam:  General: Alert pleasant oriented looks a little better than yesterday Cardiovascular: S1-S2 no murmur rub or  gallop Respiratory: Clinically clear no added sound Abdomen: Soft nontender nondistended no rebound no guarding no organomegaly Skin no lower extremity edema no rash Neuro moving all 4 limbs equally except for right upper extremity Musculoskeletal, patient has limitation to movement and finds it difficult to raise his right arm off the bed he has point tenderness over the acromioclavicular joint, he does not have any diffuse swelling  Data Reviewed: Basic Metabolic Panel:  Recent Labs Lab 11/12/15 0956 11/12/15 1350 11/12/15 2111 11/12/15 2355 11/13/15 0240 11/13/15 0649  NA 125* 126* 130* 128* 127*  128*  --   K 5.0 4.8 4.0 4.4 4.6  4.6 4.8  CL 86* 87* 93* 90* 90*  90*  --   CO2 22 24 25 22 24  25   --   GLUCOSE 295* 190* 222* 332* 354*  354*  --   BUN 78* 80* 31* 34* 38*  38*  --   CREATININE 13.38* 13.53* 6.82* 8.03* 8.57*  8.60*  --   CALCIUM 8.2* 8.0* 8.0* 7.8* 7.8*  7.9*  --   PHOS  --   --   --   --  7.1*  --    Liver Function Tests:  Recent Labs Lab 11/12/15 0305 11/13/15 0240  AST 20  --   ALT 16*  --  ALKPHOS 64  --   BILITOT 0.9  --   PROT 8.0  --   ALBUMIN 3.3* 2.6*   No results for input(s): LIPASE, AMYLASE in the last 168 hours. No results for input(s): AMMONIA in the last 168 hours. CBC:  Recent Labs Lab 11/12/15 0305 11/13/15 0240  WBC 15.3* 13.7*  NEUTROABS 13.0*  --   HGB 11.5* 10.6*  HCT 34.2* 31.6*  MCV 98.6 98.4  PLT 175 147*   Cardiac Enzymes:  Recent Labs Lab 11/12/15 0305 11/12/15 0715 11/12/15 1350 11/12/15 2111  TROPONINI 0.06* 0.07* 0.20* 0.49*   BNP: Invalid input(s): POCBNP CBG:  Recent Labs Lab 11/12/15 0827 11/12/15 1134 11/12/15 1552 11/12/15 2105 11/13/15 0739  GLUCAP 409* 239* 179* 207* 330*    Recent Results (from the past 240 hour(s))  MRSA PCR Screening     Status: None   Collection Time: 11/12/15  6:36 AM  Result Value Ref Range Status   MRSA by PCR NEGATIVE NEGATIVE Final    Comment:         The GeneXpert MRSA Assay (FDA approved for NASAL specimens only), is one component of a comprehensive MRSA colonization surveillance program. It is not intended to diagnose MRSA infection nor to guide or monitor treatment for MRSA infections.   Culture, blood (Routine X 2) w Reflex to ID Panel     Status: None (Preliminary result)   Collection Time: 11/12/15  9:56 AM  Result Value Ref Range Status   Specimen Description BLOOD BLOOD LEFT HAND  Final   Special Requests BOTTLES DRAWN AEROBIC AND ANAEROBIC 6CC  Final   Culture  Setup Time   Final    GRAM POSITIVE COCCI Gram Stain Report Called to,Read Back By and Verified With: ROBERTS,T. AT 2017 ON 11/12/2015 BY EVA OBSERVED IN AEROEBIC BOTTLE [facility] GRAM POSITIVE COCCI Gram Stain Report Called to,Read Back By and Verified With: WAGONER, R AT 2051 ON 11/12/2015 BY EVA OBSERVED IN ANAEROBIC BOTTLE Organism ID to follow Performed at [facility]    Culture PENDING  Incomplete   Report Status PENDING  Incomplete  Blood Culture ID Panel (Reflexed)     Status: Abnormal   Collection Time: 11/12/15  9:56 AM  Result Value Ref Range Status   Enterococcus species NOT DETECTED NOT DETECTED Final   Vancomycin resistance NOT DETECTED NOT DETECTED Final   Listeria monocytogenes NOT DETECTED NOT DETECTED Final   Staphylococcus species NOT DETECTED NOT DETECTED Final   Staphylococcus aureus NOT DETECTED NOT DETECTED Final   Methicillin resistance NOT DETECTED NOT DETECTED Final   Streptococcus species DETECTED (A) NOT DETECTED Final    Comment: CRITICAL RESULT CALLED TO, READ BACK BY AND VERIFIED WITH: TO TROBERTS(RN) BY TEC[place]AND 11/13/2015 AT 3:12AM    Streptococcus agalactiae NOT DETECTED NOT DETECTED Final   Streptococcus pneumoniae DETECTED (A) NOT DETECTED Final    Comment: CRITICAL RESULT CALLED TO, READ BACK BY AND VERIFIED WITH: TO TROBERTS(RN) BY TECLEVELAND 11/13/2015 AT 3:12AM    Streptococcus pyogenes NOT DETECTED NOT  DETECTED Final   Acinetobacter baumannii NOT DETECTED NOT DETECTED Final   Enterobacteriaceae species NOT DETECTED NOT DETECTED Final   Enterobacter cloacae complex NOT DETECTED NOT DETECTED Final   Escherichia coli NOT DETECTED NOT DETECTED Final   Klebsiella oxytoca NOT DETECTED NOT DETECTED Final   Klebsiella pneumoniae NOT DETECTED NOT DETECTED Final   Proteus species NOT DETECTED NOT DETECTED Final   Serratia marcescens NOT DETECTED NOT DETECTED Final   Carbapenem resistance NOT  DETECTED NOT DETECTED Final   Haemophilus influenzae NOT DETECTED NOT DETECTED Final   Neisseria meningitidis NOT DETECTED NOT DETECTED Final   Pseudomonas aeruginosa NOT DETECTED NOT DETECTED Final   Candida albicans NOT DETECTED NOT DETECTED Final   Candida glabrata NOT DETECTED NOT DETECTED Final   Candida krusei NOT DETECTED NOT DETECTED Final   Candida parapsilosis NOT DETECTED NOT DETECTED Final   Candida tropicalis NOT DETECTED NOT DETECTED Final    Comment: Performed at [facility]  Culture, blood (Routine X 2) w Reflex to ID Panel     Status: None (Preliminary result)   Collection Time: 11/12/15  1:50 PM  Result Value Ref Range Status   Specimen Description BLOOD LEFT HAND  Final   Special Requests BOTTLES DRAWN AEROBIC AND ANAEROBIC 4CC EACH  Final   Culture PENDING  Incomplete   Report Status PENDING  Incomplete     Studies:              All Imaging reviewed and is as per above notation   Scheduled Meds: . amLODipine  10 mg Oral Daily  . carvedilol  25 mg Oral BID  . diclofenac sodium  4 g Topical QID  . ibuprofen  400 mg Oral Q6H  . insulin aspart  0-9 Units Subcutaneous TID WC  . insulin aspart  10 Units Subcutaneous TID AC  . insulin detemir  30 Units Subcutaneous QHS  . piperacillin-tazobactam (ZOSYN)  IV  2.25 g Intravenous Q8H  . sevelamer carbonate  1,600 mg Oral TID WC  . sodium chloride flush  3 mL Intravenous Q12H  . sodium chloride flush  3 mL Intravenous Q12H    Continuous Infusions:    Assessment/Plan:  Possible septic arthritis shoulder? Complicated 5 may be viral illness -May benefit from arthroscopic management of the same. Consulted Dr. Aline Brochure of orthopedics to help delineate this I have obtained an MRI of the neck and of the spine and although x-rays are negative this is probably the quickest noninvasively of seeing it Continue vancomycin and Zosyn for now No other blood culture X1 = gram-positive cocci? Strep versus staph and we will await cultures Continue tramadol in addition to ibuprofen 400 every 6  Poorly controlled diabetes mellitus type 1 Continue Levemir 30 units daily at bedtime, aspart insulin 10 units 3 times a day We'll probably need sliding scale insulin in addition  History of PSVT Continue amlodipine 10, Coreg 25 twice a day  Metabolic bone disease continue sevelamer 1600 3 times a day  Severe abnormalities of electrolytes specifically hyponatremia + hyperkalemia Initial sodium 116, K6.9 Given Kayexalate Probably secondary to mild DKA on admission has blood sugars were elevated in the 400 range Now resolving  ESRD on dialysis Monday Wednesday Friday [address] Dialyzed 11/12/2015 Nephrology consulted who are assisting with management    No family at bedside Transfer to [facility]  Verneita Griffes, MD  [facility] Pager [phone] 11/13/2015, 8:42 AM    LOS: 1 day

## 2015-11-13 NOTE — Progress Notes (Signed)
OT Cancellation Note  Patient Details Name: ARTIE MCINTYRE MRN: [id number] DOB: 1970/12/13   Cancelled Treatment:    Reason Eval/Treat Not Completed: Patient at procedure or test/ unavailable. Will re-attempt OT evaluation when able.  Ailene Ravel, OTR/L,CBIS  [phone]  11/13/2015, 9:11 AM

## 2015-11-13 NOTE — Progress Notes (Signed)
.   PHARMACY - PHYSICIAN COMMUNICATION CRITICAL VALUE ALERT - BLOOD CULTURE IDENTIFICATION (BCID)  Results for orders placed or performed during the hospital encounter of 11/12/15  Blood Culture ID Panel (Reflexed) (Collected: 11/12/2015  9:56 AM)  Result Value Ref Range   Enterococcus species NOT DETECTED NOT DETECTED   Vancomycin resistance NOT DETECTED NOT DETECTED   Listeria monocytogenes NOT DETECTED NOT DETECTED   Staphylococcus species NOT DETECTED NOT DETECTED   Staphylococcus aureus NOT DETECTED NOT DETECTED   Methicillin resistance NOT DETECTED NOT DETECTED   Streptococcus species DETECTED (A) NOT DETECTED   Streptococcus agalactiae NOT DETECTED NOT DETECTED   Streptococcus pneumoniae DETECTED (A) NOT DETECTED   Streptococcus pyogenes NOT DETECTED NOT DETECTED   Acinetobacter baumannii NOT DETECTED NOT DETECTED   Enterobacteriaceae species NOT DETECTED NOT DETECTED   Enterobacter cloacae complex NOT DETECTED NOT DETECTED   Escherichia coli NOT DETECTED NOT DETECTED   Klebsiella oxytoca NOT DETECTED NOT DETECTED   Klebsiella pneumoniae NOT DETECTED NOT DETECTED   Proteus species NOT DETECTED NOT DETECTED   Serratia marcescens NOT DETECTED NOT DETECTED   Carbapenem resistance NOT DETECTED NOT DETECTED   Haemophilus influenzae NOT DETECTED NOT DETECTED   Neisseria meningitidis NOT DETECTED NOT DETECTED   Pseudomonas aeruginosa NOT DETECTED NOT DETECTED   Candida albicans NOT DETECTED NOT DETECTED   Candida glabrata NOT DETECTED NOT DETECTED   Candida krusei NOT DETECTED NOT DETECTED   Candida parapsilosis NOT DETECTED NOT DETECTED   Candida tropicalis NOT DETECTED NOT DETECTED    Name of physician (or Provider) Contacted: Dr. Verlon Au  Changes to prescribed antibiotics required: patient currently on Vancomycin and Zosyn, blood culture is positive for  Strep Pneumo . Discussed with Dr. Verlon Au and will transition antibiotics to ceftriaxone 2gm IV q24h   Isac Sarna, BS  [place], [place] Clinical Pharmacist Pager [phone] 11/13/2015  11:01 AM

## 2015-11-13 NOTE — Progress Notes (Signed)
Inpatient Diabetes Program Recommendations  AACE/ADA: New Consensus Statement on Inpatient Glycemic Control (2015)  Target Ranges:  Prepandial:   less than 140 mg/dL      Peak postprandial:   less than 180 mg/dL (1-2 hours)      Critically ill patients:  140 - 180 mg/dL   Results for Lance [name], Lance [name] (MRN [id number]) as of 11/13/2015 14:38  Ref. Range 11/12/2015 05:00 11/12/2015 07:36 11/12/2015 08:27 11/12/2015 11:34 11/12/2015 15:52 11/12/2015 21:05  Glucose-Capillary Latest Ref Range: 65 - 99 mg/dL 581 (HH) 448 (H) 409 (H) 239 (H) 179 (H) 207 (H)   Results for Lance [name], Lance [name] (MRN [id number]) as of 11/13/2015 14:38  Ref. Range 11/13/2015 07:39 11/13/2015 11:54  Glucose-Capillary Latest Ref Range: 65 - 99 mg/dL 330 (H) 383 (H)    Admit with: Hyponatremia/ Septic Shoulder  History: DM, ESRD, CHF  Home DM Meds: Levemir 30 units QHS       Novolog 10 units tidwc  Current Insulin Orders: Levemir 30 units QHS      Novolog 10 units tidwc      Novolog Sensitive Correction Scale/ SSI (0-9 units) TID AC      MD- Please consider the following in-hospital insulin adjustments:  1. Increase Levemir to 35 units QHS  2. Increase Novolog Meal Coverage to 12 units tid with meals (currently ordered as Novolog 10 units tidwc)    --Will follow patient during hospitalization--  Wyn Quaker RN, MSN, CDE Diabetes Coordinator Inpatient Glycemic Control Team Team Pager: [phone] (8a-5p)

## 2015-11-13 NOTE — Progress Notes (Signed)
Subjective: Interval History: none. and has no complaint of nausea or vomiting. Patient however still complains of right shoulder pain and numbness of his fingers. The pain is sharp and at times goes up to his neck. He denies any injury or trauma.  Objective: Vital signs in last 24 hours: Temp:  [97.9 F (36.6 C)-102.3 F (39.1 C)] 98.1 F (36.7 C) (10/02 0803) Pulse Rate:  [85-108] 85 (10/02 0100) Resp:  [16-21] 16 (10/02 0100) BP: (104-134)/(56-83) 106/61 (10/02 0100) SpO2:  [96 %-100 %] 100 % (10/02 0100) Weight:  [102.9 kg (226 lb 13.7 oz)-103.6 kg (228 lb 6.3 oz)] 102.9 kg (226 lb 13.7 oz) (10/02 0500) Weight change: 1.541 kg (3 lb 6.3 oz)  Intake/Output from previous day: 10/01 0701 - 10/02 0700 In: 753.8 [I.V.:688.8; IV Piggyback:50] Out: 2700 [Urine:200] Intake/Output this shift: No intake/output data recorded.  Generally patient is alert and in no apparent distress Chest is clear to auscultation Heart exam regular rate and rhythm. No murmur Extremities: No edema. He has some tenderness of his right shoulder. Pain also when trying to raise it. Patient has a fistula on the same but has this moment doesn't have any sign of inflammation.  Lab Results:  Recent Labs  11/12/15 0305 11/13/15 0240  WBC 15.3* 13.7*  HGB 11.5* 10.6*  HCT 34.2* 31.6*  PLT 175 147*   BMET:  Recent Labs  11/12/15 2355 11/13/15 0240 11/13/15 0649  NA 128* 127*  128*  --   K 4.4 4.6  4.6 4.8  CL 90* 90*  90*  --   CO2 22 24  25   --   GLUCOSE 332* 354*  354*  --   BUN 34* 38*  38*  --   CREATININE 8.03* 8.57*  8.60*  --   CALCIUM 7.8* 7.8*  7.9*  --    No results for input(s): PTH in the last 72 hours. Iron Studies: No results for input(s): IRON, TIBC, TRANSFERRIN, FERRITIN in the last 72 hours.  Studies/Results: Dg Chest 2 View  Result Date: 11/12/2015 CLINICAL DATA:  Right rib pain starting yesterday, now moved to the right arm and neck. No injury EXAM: CHEST  2 VIEW  COMPARISON:  05/05/2014 FINDINGS: The heart size and mediastinal contours are within normal limits. Both lungs are clear. The visualized skeletal structures are unremarkable. IMPRESSION: No active cardiopulmonary disease. Electronically Signed   By: Lucienne Capers M.D.   On: 11/12/2015 03:52   Dg Shoulder Right  Result Date: 11/12/2015 CLINICAL DATA:  Rib pain starting yesterday, now moving to the right arm and neck. No injury. EXAM: RIGHT SHOULDER - 2+ VIEW COMPARISON:  None. FINDINGS: Degenerative changes in the acromioclavicular and glenohumeral joints with joint space narrowing and osteophyte formation. Subacromial spur formation is present. Decreased subacromial space may indicate chronic rotator cuff arthropathy. No evidence of acute fracture or dislocation of the right shoulder. Coracoclavicular spaces are normal. IMPRESSION: Degenerative changes in the right shoulder. Subacromial spurring with possible rotator cuff arthropathy. No acute bony abnormalities. Electronically Signed   By: Lucienne Capers M.D.   On: 11/12/2015 03:50    I have reviewed the patient's current medications.  Assessment/Plan: Problem #1 right shoulder pain: Etiology at this moment not clear x-ray shows some degenerative joint changes but no other significant finding. Problem #2 end-stage renal disease: His status post hemodialysis yesterday. Presently he is asymptomatic Problem #3 hyperkalemia: His potassium has corrected  Problem #4 metabolic bone disease: His calcium  is in range but his  phosphorus is high. Problem #5 hypertension: His blood pressure is reasonably controlled Problem #6 diabetes: His blood sugar has improved but still high. Problem #7 history of anemia: His hemoglobin is within our target goal. Plan: We'll start patient on Dilantin 800 mg 2 tablets by mouth 3 times a day one with snack. 2] we'll check his renal panel in the morning 3] we'll make arrangements for patient to get dialysis  tomorrow.   LOS: 1 day   Emmaleigh Longo S 11/13/2015,8:22 AM

## 2015-11-13 NOTE — Progress Notes (Signed)
Initial Nutrition Assessment   INTERVENTION:  Recommend change Heart Healthy diet to Renal/ CHO Modified with 1200 ml fluid restriction due to pt ongoing dialysis.   Provide high protein snack between meals- TID  NUTRITION DIAGNOSIS:   Increased nutrient needs (protein and energy) related to ESRD-HD as evidenced by est needs.   GOAL:  Pt to meet >/= 90% of their estimated nutrition needs     MONITOR:  Po intake, labs, I/O's and wt trends     REASON FOR ASSESSMENT:   Malnutrition Screening Tool    ASSESSMENT:  Patient has multiple co- morbid conditions including ESRD with HD , DM type I and CHF.  His blood cultures + strep pneumo. He is eating well today 100% of lunch per pt. He would have eaten all his breakfast but had to leave for a test.  He's had no more episodes of vomiting.   Pt says he follows a renal diet at home and takes his binders daily.  Nutrition-Focused physical exam completed. Findings are no fat depletion, no muscle depletion, and no edema.   Recent Labs Lab 11/12/15 2355 11/13/15 0240 11/13/15 0649 11/13/15 1456  NA 128* 127*  128*  --  130*  K 4.4 4.6  4.6 4.8 4.5  CL 90* 90*  90*  --  90*  CO2 22 24  25   --  27  BUN 34* 38*  38*  --  52*  CREATININE 8.03* 8.57*  8.60*  --  10.21*  CALCIUM 7.8* 7.8*  7.9*  --  8.1*  PHOS  --  7.1*  --  7.8*  GLUCOSE 332* 354*  354*  --  280*    Abnormal Labs: sodium, Chloride, BUN/Cr, Phos and glucose  Meds: insulin, Renvela (with meals)  Diet Order:  Diet Heart Room service appropriate? Yes; Fluid consistency: Thin  Skin:  Reviewed, no issues  Last BM:  10/1   Height:   Ht Readings from Last 1 Encounters:  11/12/15 6\' 3"  (1.905 m)    Weight:   Wt Readings from Last 1 Encounters:  11/13/15 226 lb 13.7 oz (102.9 kg)  Dry wt (102 kg)  Ideal Body Weight:  89 kg  BMI:  Body mass index is 28.35 kg/m.  Estimated Nutritional Needs:   Kcal:  0100-7121  Protein:  106-115 gr  Fluid:  1200  ml daily  EDUCATION NEEDS: none at this time  Colman Cater MS,RD,CSG,LDN Office: #[phone] Pager: [phone]

## 2015-11-14 ENCOUNTER — Inpatient Hospital Stay (HOSPITAL_COMMUNITY): Payer: BLUE CROSS/BLUE SHIELD

## 2015-11-14 LAB — GLUCOSE, CAPILLARY
GLUCOSE-CAPILLARY: 95 mg/dL (ref 65–99)
GLUCOSE-CAPILLARY: 372 mg/dL — AB (ref 65–99)
Glucose-Capillary: 145 mg/dL — ABNORMAL HIGH (ref 65–99)
Glucose-Capillary: 84 mg/dL (ref 65–99)

## 2015-11-14 LAB — CBC
HEMATOCRIT: 30.5 % — AB (ref 39.0–52.0)
HEMOGLOBIN: 10.6 g/dL — AB (ref 13.0–17.0)
MCH: 33.8 pg (ref 26.0–34.0)
MCHC: 34.8 g/dL (ref 30.0–36.0)
MCV: 97.1 fL (ref 78.0–100.0)
Platelets: 160 10*3/uL (ref 150–400)
RBC: 3.14 MIL/uL — ABNORMAL LOW (ref 4.22–5.81)
RDW: 15 % (ref 11.5–15.5)
WBC: 12.5 10*3/uL — AB (ref 4.0–10.5)

## 2015-11-14 LAB — RENAL FUNCTION PANEL
ANION GAP: 16 — AB (ref 5–15)
Albumin: 2.5 g/dL — ABNORMAL LOW (ref 3.5–5.0)
BUN: 62 mg/dL — AB (ref 6–20)
CHLORIDE: 88 mmol/L — AB (ref 101–111)
CO2: 27 mmol/L (ref 22–32)
Calcium: 8.1 mg/dL — ABNORMAL LOW (ref 8.9–10.3)
Creatinine, Ser: 11.3 mg/dL — ABNORMAL HIGH (ref 0.61–1.24)
GFR, EST AFRICAN AMERICAN: 5 mL/min — AB (ref >60)
GFR, EST NON AFRICAN AMERICAN: 5 mL/min — AB (ref >60)
GLUCOSE: 139 mg/dL — AB (ref 65–99)
POTASSIUM: 4.1 mmol/L (ref 3.5–5.1)
Phosphorus: 8.6 mg/dL — ABNORMAL HIGH (ref 2.5–4.6)
Sodium: 131 mmol/L — ABNORMAL LOW (ref 135–145)

## 2015-11-14 LAB — CULTURE, BLOOD (ROUTINE X 2)

## 2015-11-14 LAB — HEPATITIS B SURFACE ANTIGEN: HEP B S AG: NEGATIVE

## 2015-11-14 MED ORDER — PROMETHAZINE HCL 25 MG/ML IJ SOLN
12.5000 mg | Freq: Four times a day (QID) | INTRAMUSCULAR | Status: DC | PRN
  Administered 2015-11-14: 12.5 mg via INTRAVENOUS
  Filled 2015-11-14: qty 1

## 2015-11-14 MED ORDER — LEVOFLOXACIN 750 MG PO TABS
750.0000 mg | ORAL_TABLET | ORAL | Status: DC
  Administered 2015-11-14: 750 mg via ORAL
  Filled 2015-11-14: qty 1

## 2015-11-14 NOTE — Progress Notes (Signed)
Subjective: Interval History: He shoulder pain is much better. Presently he denies any weakness. Patient overall  Feels  much better.  Objective: Vital signs in last 24 hours: Temp:  [97.1 F (36.2 C)-97.8 F (36.6 C)] 97.8 F (36.6 C) (10/03 0544) Pulse Rate:  [78-85] 78 (10/03 0544) Resp:  [20] 20 (10/03 0544) BP: (99-101)/(58-59) 101/58 (10/03 0544) SpO2:  [95 %-98 %] 98 % (10/03 0544) Weight change:   Intake/Output from previous day: 10/02 0701 - 10/03 0700 In: 290 [P.O.:240; IV Piggyback:50] Out: -  Intake/Output this shift: No intake/output data recorded.  Generally patient is alert and in no apparent distress Chest is clear to auscultation Heart exam regular rate and rhythm. No murmur Extremities: No edema. He has some tenderness of his right shoulder. Pain also when trying to raise it. Patient has a fistula on the same but has this moment doesn't have any sign of inflammation.  Lab Results:  Recent Labs  11/13/15 1456 11/14/15 0558  WBC 12.2* 12.5*  HGB 10.8* 10.6*  HCT 32.1* 30.5*  PLT 150 160   BMET:   Recent Labs  11/13/15 1456 11/14/15 0556  NA 130* 131*  K 4.5 4.1  CL 90* 88*  CO2 27 27  GLUCOSE 280* 139*  BUN 52* 62*  CREATININE 10.21* 11.30*  CALCIUM 8.1* 8.1*   No results for input(s): PTH in the last 72 hours. Iron Studies: No results for input(s): IRON, TIBC, TRANSFERRIN, FERRITIN in the last 72 hours.  Studies/Results: Mr Cervical Spine Wo Contrast  Result Date: 11/13/2015 CLINICAL DATA:  45 year old diabetic hypertensive male with neck pain extending to right shoulder 2 weeks. Anemia. No known injury. EXAM: MRI CERVICAL SPINE WITHOUT CONTRAST TECHNIQUE: Multiplanar, multisequence MR imaging of the cervical spine was performed. No intravenous contrast was administered. COMPARISON:  None. FINDINGS: Alignment: Normal. Vertebrae: Decreased signal intensity of bone marrow may be related to habitus or patient's underlying anemia. No focal  osseous abnormality. 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 Posterior Fossa, vertebral arteries, paraspinal tissues: Slightly prominent size left level 2 lymph nodes measuring up to 1.2 cm short axis dimension of questionable significance/ etiology. Disc levels: C2-3:  Negative. C3-4:  Negative. C4-5: Minimal right uncinate hypertrophy with minimal right foraminal narrowing. C5-6:  Negative. C6-7: Mild bulge/shallow protrusion right paracentral position with slight indentation right ventral thecal sac approaching but not compressing the right ventral nerve root. C7-T1:  Mild facet degenerative changes. IMPRESSION: C6-7 mild bulge/shallow protrusion right paracentral position with slight indentation right ventral thecal sac approaching but not compressing the right ventral nerve root. C4-5 minimal right foraminal narrowing. 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 Decreased signal intense of bone marrow may be related to patient's habitus or underlying anemia. Slightly prominent size left level 2 lymph nodes measuring up to 1.2 cm short axis dimension of questionable significance or etiology. Electronically Signed   By: Genia Del M.D.   On: 11/13/2015 10:05   Mr Shoulder Right Wo Contrast  Result Date: 11/13/2015 CLINICAL DATA:  Neck pain radiating to the right shoulder. Pain for 2 weeks. EXAM: MRI OF THE RIGHT SHOULDER WITHOUT CONTRAST TECHNIQUE: Multiplanar, multisequence MR imaging of the shoulder was performed. No intravenous contrast was administered. COMPARISON:  None. FINDINGS: Rotator cuff: Complete tear of the supraspinatus tendon with 3.5 cm of retraction Infraspinatus tendon is intact. Teres minor tendon is intact. Subscapularis tendon is intact. Muscles:  No atrophy or fatty replacement of nor abnormal signal within, the muscles of the rotator cuff. Biceps long head: Mild tendinosis of the intraarticular portion of  the long head of the biceps tendon. Acromioclavicular Joint: Mild arthropathy of the acromioclavicular joint. Type I acromion. No subacromial/subdeltoid bursal fluid. Glenohumeral Joint: No joint effusion. No chondral defect. Labrum: Grossly intact, but evaluation is limited by lack of intraarticular fluid. Bones: Subcortical reactive marrow changes at the infraspinatus insertion. No other marrow abnormality, fracture or dislocation. Other: None. IMPRESSION: 1. Complete tear of the supraspinatus tendon with 3.5 cm of retraction 2. Mild tendinosis of the intraarticular portion of the long head of the biceps tendon. Electronically Signed   By: Kathreen Devoid   On: 11/13/2015 09:57    I have reviewed the patient's current medications.  Assessment/Plan: Problem #1 right shoulder pain: Etiology at this moment not clear x-ray shows some degenerative joint changes but no other significant finding. Patient is seen by orthopedic surgery. At this moment the pain is much better. Problem #2 end-stage renal disease: His status post hemodialysis on Sunday. His regular dialysis days are Monday Wednesday Friday. Problem #3 hyperkalemia: His potassium has corrected  Problem #4 metabolic bone disease: His calcium  is in range but his phosphorus is high. Presently started on a binder. Problem #5 hypertension: His blood pressure is reasonably controlled Problem #6 diabetes: His blood sugar has improved but still high. Problem #7 history of anemia: His hemoglobin is within our target goal. Plan: We'll continue his present management. 2] we'll check his renal panel in the morning 3] we'll make arrangements for patient to get dialysis tomorrow.   LOS: 2 days   Liliah Dorian S 11/14/2015,9:04 AM

## 2015-11-14 NOTE — Progress Notes (Addendum)
Lance [name] QIO:[id number] DOB: [birth year] DOA: 11/12/2015 PCP: Mickie Hillier, MD  Brief narrative:  45 y/o ? 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 History of chronic diastolic heart failure PSVT Hyperlipidemia  Admitted as per history of present illness with nausea vomiting hyponatremia and hyperkalemia He has also had diarrhea States has been sick for the past 3 days with n/v R shoulder pain since 9/29 -unknown as to how this happened-the patient has never had a prior history of gout nor any arthritide Took ibuprofen but the abd pain n/v started before this No cough No chills no rigors No sob Seems fairly well otherwise Notified that CBG elevated   Past medical history-As per Problem list Chart reviewed as below-   Consultants:  Orthopedics Dr Aline Brochure  Procedures:   none  Antibiotics:   Vacnomycin  ZOsyn   Subjective   Tramadol seems to relieve the pain in the right shoulder Patient still cannot raise the shoulder above the head No nausea no vomiting No blurred vision no double vision at present No recent fevers over the past 24 hours    Objective    Interim History:   Telemetry: Sinus rhythm   Objective: Vitals:   11/13/15 1232 11/13/15 2055 11/14/15 0544 11/14/15 1000  BP:  (!) 99/59 (!) 101/58 102/60  Pulse:  85 78 74  Resp:  20 20   Temp: 97.2 F (36.2 C) 97.1 F (36.2 C) 97.8 F (36.6 C)   TempSrc: Oral Oral Oral   SpO2:  95% 98%   Weight:      Height:        Intake/Output Summary (Last 24 hours) at 11/14/15 1111 Last data filed at 11/14/15 0300  Gross per 24 hour  Intake              240 ml  Output                0 ml  Net              240 ml    Exam:  General: Alert pleasant oriented looks a little better than  yesterday Cardiovascular: S1-S2 no murmur rub or gallop Respiratory: Clinically clear no added sound Abdomen: Soft nontender nondistended no rebound no guarding no organomegaly Skin no lower extremity edema no rash Neuro moving all 4 limbs equally except for right upper extremity Musculoskeletal, patient has limitation to movement-he does not have any diffuse swellingt and finds it difficult to raise his right arm off the bed he has point tenderness over the acromioclavicular joint,   Data Reviewed: Basic Metabolic Panel:  Recent Labs Lab 11/12/15 2111 11/12/15 2355 11/13/15 0240  11/13/15 1456 11/14/15 0556  NA 130* 128* 127*  128*  --  130* 131*  K 4.0 4.4 4.6  4.6  < > 4.5 4.1  CL 93* 90* 90*  90*  --  90* 88*  CO2 25 22 24  25   --  27 27  GLUCOSE 222* 332* 354*  354*  --  280* 139*  BUN 31* 34* 38*  38*  --  52* 62*  CREATININE 6.82* 8.03* 8.57*  8.60*  --  10.21* 11.30*  CALCIUM 8.0* 7.8* 7.8*  7.9*  --  8.1* 8.1*  PHOS  --   --  7.1*  --  7.8* 8.6*  < > = values  in this interval not displayed. Liver Function Tests:  Recent Labs Lab 11/12/15 0305 11/13/15 0240 11/13/15 1456 11/14/15 0556  AST 20  --   --   --   ALT 16*  --   --   --   ALKPHOS 64  --   --   --   BILITOT 0.9  --   --   --   PROT 8.0  --   --   --   ALBUMIN 3.3* 2.6* 2.7* 2.5*   No results for input(s): LIPASE, AMYLASE in the last 168 hours. No results for input(s): AMMONIA in the last 168 hours. CBC:  Recent Labs Lab 11/12/15 0305 11/13/15 0240 11/13/15 1456 11/14/15 0558  WBC 15.3* 13.7* 12.2* 12.5*  NEUTROABS 13.0*  --   --   --   HGB 11.5* 10.6* 10.8* 10.6*  HCT 34.2* 31.6* 32.1* 30.5*  MCV 98.6 98.4 98.5 97.1  PLT 175 147* 150 160   Cardiac Enzymes:  Recent Labs Lab 11/12/15 0305 11/12/15 0715 11/12/15 1350 11/12/15 2111  TROPONINI 0.06* 0.07* 0.20* 0.49*   BNP: Invalid input(s): POCBNP CBG:  Recent Labs Lab 11/13/15 0739 11/13/15 1154 11/13/15 1611  11/13/15 2058 11/14/15 0741  GLUCAP 330* 383* 213* 125* 145*    Recent Results (from the past 240 hour(s))  MRSA PCR Screening     Status: None   Collection Time: 11/12/15  6:36 AM  Result Value Ref Range Status   MRSA by PCR NEGATIVE NEGATIVE Final    Comment:        The GeneXpert MRSA Assay (FDA approved for NASAL specimens only), is one component of a comprehensive MRSA colonization surveillance program. It is not intended to diagnose MRSA infection nor to guide or monitor treatment for MRSA infections.   Culture, blood (Routine X 2) w Reflex to ID Panel     Status: Abnormal   Collection Time: 11/12/15  9:56 AM  Result Value Ref Range Status   Specimen Description BLOOD LEFT HAND  Final   Special Requests BOTTLES DRAWN AEROBIC AND ANAEROBIC 6CC  Final   Culture  Setup Time   Final    GRAM POSITIVE COCCI Gram Stain Report Called to,Read Back By and Verified With: ROBERTS,T. AT 2017 ON 11/12/2015 BY EVA OBSERVED IN AEROBIC BOTTLE [facility] GRAM POSITIVE COCCI Gram Stain Report Called to,Read Back By and Verified With: WAGONER, R AT 2051 ON 11/12/2015 BY EVA OBSERVED IN ANAEROBIC BOTTLE    Culture STREPTOCOCCUS PNEUMONIAE (A)  Final   Report Status 11/14/2015 FINAL  Final   Organism ID, Bacteria STREPTOCOCCUS PNEUMONIAE  Final      Susceptibility   Streptococcus pneumoniae - MIC*    ERYTHROMYCIN <=0.12 SENSITIVE Sensitive     LEVOFLOXACIN 0.5 SENSITIVE Sensitive     PENICILLIN Value in next row Sensitive      SENSITIVE<=0.06    CEFTRIAXONE Value in next row Sensitive      SENSITIVE<=0.12    * STREPTOCOCCUS PNEUMONIAE  Blood Culture ID Panel (Reflexed)     Status: Abnormal   Collection Time: 11/12/15  9:56 AM  Result Value Ref Range Status   Enterococcus species NOT DETECTED NOT DETECTED Final   Vancomycin resistance NOT DETECTED NOT DETECTED Final   Listeria monocytogenes NOT DETECTED NOT DETECTED Final   Staphylococcus species NOT DETECTED NOT DETECTED Final    Staphylococcus aureus NOT DETECTED NOT DETECTED Final   Methicillin resistance NOT DETECTED NOT DETECTED Final   Streptococcus species DETECTED (A) NOT DETECTED Final  Comment: CRITICAL RESULT CALLED TO, READ BACK BY AND VERIFIED WITH: TO TROBERTS(RN) BY TECLEVELAND 11/13/2015 AT 3:12AM    Streptococcus agalactiae NOT DETECTED NOT DETECTED Final   Streptococcus pneumoniae DETECTED (A) NOT DETECTED Final    Comment: CRITICAL RESULT CALLED TO, READ BACK BY AND VERIFIED WITH: TO TROBERTS(RN) BY TECLEVELAND 11/13/2015 AT 3:12AM    Streptococcus pyogenes NOT DETECTED NOT DETECTED Final   Acinetobacter baumannii NOT DETECTED NOT DETECTED Final   Enterobacteriaceae species NOT DETECTED NOT DETECTED Final   Enterobacter cloacae complex NOT DETECTED NOT DETECTED Final   Escherichia coli NOT DETECTED NOT DETECTED Final   Klebsiella oxytoca NOT DETECTED NOT DETECTED Final   Klebsiella pneumoniae NOT DETECTED NOT DETECTED Final   Proteus species NOT DETECTED NOT DETECTED Final   Serratia marcescens NOT DETECTED NOT DETECTED Final   Carbapenem resistance NOT DETECTED NOT DETECTED Final   Haemophilus influenzae NOT DETECTED NOT DETECTED Final   Neisseria meningitidis NOT DETECTED NOT DETECTED Final   Pseudomonas aeruginosa NOT DETECTED NOT DETECTED Final   Candida albicans NOT DETECTED NOT DETECTED Final   Candida glabrata NOT DETECTED NOT DETECTED Final   Candida krusei NOT DETECTED NOT DETECTED Final   Candida parapsilosis NOT DETECTED NOT DETECTED Final   Candida tropicalis NOT DETECTED NOT DETECTED Final    Comment: Performed at [facility]  Culture, blood (Routine X 2) w Reflex to ID Panel     Status: None (Preliminary result)   Collection Time: 11/12/15  1:50 PM  Result Value Ref Range Status   Specimen Description BLOOD LEFT HAND  Final   Special Requests BOTTLES DRAWN AEROBIC AND ANAEROBIC 4CC EACH  Final   Culture NO GROWTH 2 DAYS  Final   Report Status PENDING  Incomplete      Studies:              All Imaging reviewed and is as per above notation   Scheduled Meds: . amLODipine  10 mg Oral Daily  . carvedilol  25 mg Oral BID  . cefTRIAXone (ROCEPHIN)  IV  2 g Intravenous Q24H  . diclofenac sodium  4 g Topical QID  . ibuprofen  400 mg Oral Q6H  . insulin aspart  0-9 Units Subcutaneous TID WC  . insulin aspart  12 Units Subcutaneous TID AC  . insulin detemir  35 Units Subcutaneous QHS  . sevelamer carbonate  1,600 mg Oral TID WC  . sodium chloride flush  3 mL Intravenous Q12H  . sodium chloride flush  3 mL Intravenous Q12H   Continuous Infusions:    Assessment/Plan:  Possible septic arthritis shoulder? Complicated maybe by a viral illness May benefit from arthroscopic management of the same. Consulted Dr. Aline Brochure of orthopedics to help delineate this I have obtained an MRI of the neck and of the spine and although x-rays are negative this is probably the quickest noninvasively of seeing it Repeating imaging with Contrast for shoulder Echocardiogram not really suggestive of infection Transitioned from vancomycin and Zosyn --ceftriaxone 2 g 11/13/15 No other blood culture X1 = gram-positive cocci? Strep versus staph and we will await cultures and MRI prior to further decisions  Rpt cultures 11/14/15 D/w Dr. Johnnye Sima who feels can transition to 1 mo of IV vancomycin on d/c pending work-up for spetic arthritis as long as BC rpt is clear 3:41 PM  Addendum: cxr rpt suggestive of developing PNA.  Change above Abx to po levaquin 750 q 48 If prelim blood cult neg, might only need  to Rx for PNA Continue tramadol in addition to ibuprofen 400 every 6  Poorly controlled diabetes mellitus type 1 Continue Levemir 30 units daily at bedtime, aspart insulin 10 units 3 times a day We'll probably need sliding scale insulin in addition Sugars ranging 125--213  History of PSVT Continue amlodipine 10, Coreg 25 twice a day  Metabolic bone disease continue sevelamer 1600  3 times a day  Severe abnormalities of electrolytes specifically hyponatremia + hyperkalemia Initial sodium 116, K6.9 Given Kayexalate Probably secondary to mild DKA on admission has blood sugars were elevated in the 400 range Now resolving  ESRD on dialysis Monday Wednesday Friday [address] Dialyzed 11/12/2015 Nephrology consulted who are assisting with management   No family at bedside Monitor on MedSurg Expect delineation of Abx in 24-48 hrs and may need them via dialysis on d/c  Verneita Griffes, MD  [facility] Pager [phone] 11/14/2015, 11:11 AM    LOS: 2 days

## 2015-11-14 NOTE — Progress Notes (Signed)
Pt. Vomited X2 around 1400. Zofran IV medication administered. Will continue to monitor.

## 2015-11-15 ENCOUNTER — Inpatient Hospital Stay (HOSPITAL_COMMUNITY): Payer: BLUE CROSS/BLUE SHIELD

## 2015-11-15 LAB — BASIC METABOLIC PANEL
ANION GAP: 15 (ref 5–15)
BUN: 79 mg/dL — AB (ref 6–20)
CO2: 27 mmol/L (ref 22–32)
Calcium: 8.1 mg/dL — ABNORMAL LOW (ref 8.9–10.3)
Chloride: 84 mmol/L — ABNORMAL LOW (ref 101–111)
Creatinine, Ser: 13.34 mg/dL — ABNORMAL HIGH (ref 0.61–1.24)
GFR, EST AFRICAN AMERICAN: 4 mL/min — AB (ref >60)
GFR, EST NON AFRICAN AMERICAN: 4 mL/min — AB (ref >60)
Glucose, Bld: 403 mg/dL — ABNORMAL HIGH (ref 65–99)
POTASSIUM: 5.3 mmol/L — AB (ref 3.5–5.1)
SODIUM: 126 mmol/L — AB (ref 135–145)

## 2015-11-15 LAB — GLUCOSE, CAPILLARY
GLUCOSE-CAPILLARY: 400 mg/dL — AB (ref 65–99)
GLUCOSE-CAPILLARY: 297 mg/dL — AB (ref 65–99)
GLUCOSE-CAPILLARY: 69 mg/dL (ref 65–99)
GLUCOSE-CAPILLARY: 74 mg/dL (ref 65–99)
Glucose-Capillary: 111 mg/dL — ABNORMAL HIGH (ref 65–99)
Glucose-Capillary: 366 mg/dL — ABNORMAL HIGH (ref 65–99)

## 2015-11-15 LAB — CBC WITH DIFFERENTIAL/PLATELET
BASOS ABS: 0 10*3/uL (ref 0.0–0.1)
BASOS PCT: 0 %
EOS ABS: 0.1 10*3/uL (ref 0.0–0.7)
EOS PCT: 1 %
HCT: 30 % — ABNORMAL LOW (ref 39.0–52.0)
HEMOGLOBIN: 10.2 g/dL — AB (ref 13.0–17.0)
Lymphocytes Relative: 17 %
Lymphs Abs: 1.9 10*3/uL (ref 0.7–4.0)
MCH: 32.6 pg (ref 26.0–34.0)
MCHC: 34 g/dL (ref 30.0–36.0)
MCV: 95.8 fL (ref 78.0–100.0)
Monocytes Absolute: 1.1 10*3/uL — ABNORMAL HIGH (ref 0.1–1.0)
Monocytes Relative: 9 %
NEUTROS PCT: 73 %
Neutro Abs: 8.5 10*3/uL — ABNORMAL HIGH (ref 1.7–7.7)
PLATELETS: 165 10*3/uL (ref 150–400)
RBC: 3.13 MIL/uL — AB (ref 4.22–5.81)
RDW: 15 % (ref 11.5–15.5)
WBC: 11.6 10*3/uL — AB (ref 4.0–10.5)

## 2015-11-15 LAB — ALBUMIN: ALBUMIN: 2.4 g/dL — AB (ref 3.5–5.0)

## 2015-11-15 LAB — PHOSPHORUS: PHOSPHORUS: 8.3 mg/dL — AB (ref 2.5–4.6)

## 2015-11-15 MED ORDER — VANCOMYCIN HCL IN DEXTROSE 1-5 GM/200ML-% IV SOLN: 1000.0000 mg | INTRAVENOUS | Status: DC

## 2015-11-15 MED ORDER — VANCOMYCIN HCL 10 G IV SOLR
1500.0000 mg | Freq: Once | INTRAVENOUS | Status: AC
  Administered 2015-11-15: 1500 mg via INTRAVENOUS
  Filled 2015-11-15: qty 1500

## 2015-11-15 MED ORDER — HYDROCODONE-ACETAMINOPHEN 5-325 MG PO TABS
1.0000 | ORAL_TABLET | Freq: Once | ORAL | Status: AC
  Administered 2015-11-15: 2 via ORAL
  Filled 2015-11-15: qty 2

## 2015-11-15 MED ORDER — ALUM & MAG HYDROXIDE-SIMETH 200-200-20 MG/5ML PO SUSP
30.0000 mL | Freq: Four times a day (QID) | ORAL | Status: DC | PRN
  Administered 2015-11-15: 30 mL via ORAL
  Filled 2015-11-15: qty 30

## 2015-11-15 MED ORDER — INSULIN DETEMIR 100 UNIT/ML ~~LOC~~ SOLN
18.0000 [IU] | Freq: Once | SUBCUTANEOUS | Status: AC
  Administered 2015-11-15: 18 [IU] via SUBCUTANEOUS
  Filled 2015-11-15: qty 0.18

## 2015-11-15 MED ORDER — HEPARIN SODIUM (PORCINE) 5000 UNIT/ML IJ SOLN
5000.0000 [IU] | Freq: Three times a day (TID) | INTRAMUSCULAR | Status: DC
  Administered 2015-11-15 – 2015-11-16 (×2): 5000 [IU] via SUBCUTANEOUS
  Filled 2015-11-15 (×2): qty 1

## 2015-11-15 MED ORDER — SODIUM CHLORIDE 0.9 % IV SOLN: 100.0000 mL | INTRAVENOUS | Status: DC | PRN

## 2015-11-15 MED ORDER — INSULIN DETEMIR 100 UNIT/ML ~~LOC~~ SOLN
35.0000 [IU] | Freq: Every day | SUBCUTANEOUS | Status: DC
  Filled 2015-11-15: qty 0.35

## 2015-11-15 MED ORDER — INSULIN ASPART 100 UNIT/ML ~~LOC~~ SOLN
0.0000 [IU] | Freq: Three times a day (TID) | SUBCUTANEOUS | Status: DC
  Administered 2015-11-16: 11 [IU] via SUBCUTANEOUS

## 2015-11-15 NOTE — Progress Notes (Addendum)
PROGRESS NOTE                                                                                                                                                                                                             Patient Demographics:    Lance [name], is a 45 y.o. male, DOB - 1970-11-15, WGN:[id number]  Admit date - 11/12/2015   Admitting Physician Phillips Grout, MD  Outpatient Primary MD for the patient is Mickie Hillier, MD  LOS - 3  Chief Complaint  Patient presents with  . Shoulder Pain       Brief Narrative    45 year old man with type 1 diabetes mellitus, ESRD on Monday, Wednesday and Friday dialysis schedule, nephrotic syndrome, SVT, dyslipidemia, who was admitted to the hospital with nausea vomiting which has since resolved, he also developed some right shoulder pain prior to his admission which per workup in the hospital appears to be due to supraspinatus tear and bicep tendonitis, he later developed fever which was found to be due to pneumonia. He has been placed on appropriate antibiotics with good results.   Subjective:    Lance [name] today has, No headache, No chest pain, No abdominal pain - No Nausea, No new weakness tingling or numbness, Improved Cough - SOB.     Assessment  & Plan :     1.Nausea vomiting initially due to viral gastroenteritis likely due to strep pneumonia. Initial chest x-ray was negative most likely has this was early in the disease, cultures noted, discussed with ID as he is a dialysis patient, per Dr. Johnnye Sima he will be placed on IV vancomycin for a total of 4 weeks to be given during his dialysis treatments, pharmacy will be consulted. Start date 12/16/2015.    2. Right shoulder pain. Appears to be due to supraspinatus tear along with biceps tendinitis, doubt this is septic arthritis, orthopedics following. Supportive care.  3. ESRD with metabolic bone disease, mild  hyponatremia and hypokalemia. Has right arm AV fistula, on M, W, F schedule. Renal on board. Due for dialysis later today.  4. Hypertension and history of PSVT. Continue Norvasc and Coreg.  5. Non ACS pattern Trop rise - chest pain free, likely ESRD and PNA related, EKG & TTE stable, Continue B blocker.  6.DM type I. On Levemir and sliding scale.  CBG (  last 3)   Recent Labs  11/15/15 0742 11/15/15 0935 11/15/15 1139  GLUCAP 400* 297* 111*       Family Communication  :  None  Code Status :  Full  Diet : Renal  Disposition Plan  :  Home in am  Consults  :  Renal  Procedures  :    TTE  - Upper normal LV chamber size with mild LVH and LVEF approximately 50%. Grossly normal diastolic function. Moderate biatrial enlargement. MAC with mild mitral regurgitation. Moderately sclerotic aortic valve without stenosis. Trivial tricuspid regurgitation. Elevated CVP. No obvious valvular vegetations.  DVT Prophylaxis  :   Heparin   Lab Results  Component Value Date   PLT 165 11/15/2015    Inpatient Medications  Scheduled Meds: . amLODipine  10 mg Oral Daily  . carvedilol  25 mg Oral BID  . diclofenac sodium  4 g Topical QID  . ibuprofen  400 mg Oral Q6H  . insulin aspart  0-9 Units Subcutaneous TID WC  . insulin aspart  12 Units Subcutaneous TID AC  . insulin detemir  35 Units Subcutaneous QHS  . levofloxacin  750 mg Oral Q48H  . sevelamer carbonate  1,600 mg Oral TID WC  . sodium chloride flush  3 mL Intravenous Q12H  . sodium chloride flush  3 mL Intravenous Q12H   Continuous Infusions:  PRN Meds:.sodium chloride, sodium chloride, sodium chloride, sodium chloride, sodium chloride, sodium chloride, sodium chloride, acetaminophen, alteplase, heparin, heparin, heparin, lidocaine (PF), lidocaine-prilocaine, ondansetron **OR** ondansetron (ZOFRAN) IV, pentafluoroprop-tetrafluoroeth, promethazine, sodium chloride flush, traMADol  Antibiotics  :    Anti-infectives    Start      Dose/Rate Route Frequency Ordered Stop   11/14/15 1545  levofloxacin (LEVAQUIN) tablet 750 mg     750 mg Oral Every 48 hours 11/14/15 1542     11/13/15 1200  cefTRIAXone (ROCEPHIN) 2 g in dextrose 5 % 50 mL IVPB  Status:  Discontinued     2 g 100 mL/hr over 30 Minutes Intravenous Every 24 hours 11/13/15 1106 11/14/15 1542   11/12/15 2000  vancomycin (VANCOCIN) IVPB 1000 mg/200 mL premix     1,000 mg 200 mL/hr over 60 Minutes Intravenous Once in dialysis 11/12/15 1216 11/12/15 2045   11/12/15 1400  piperacillin-tazobactam (ZOSYN) IVPB 2.25 g  Status:  Discontinued     2.25 g 100 mL/hr over 30 Minutes Intravenous Every 8 hours 11/12/15 1203 11/12/15 1212   11/12/15 1400  piperacillin-tazobactam (ZOSYN) 2.25 g in dextrose 5 % 50 mL IVPB  Status:  Discontinued     2.25 g 100 mL/hr over 30 Minutes Intravenous Every 8 hours 11/12/15 1212 11/13/15 1105   11/12/15 1215  vancomycin (VANCOCIN) 1,500 mg in sodium chloride 0.9 % 500 mL IVPB     1,500 mg 250 mL/hr over 120 Minutes Intravenous  Once 11/12/15 1205 11/12/15 1413         Objective:   Vitals:   11/14/15 1119 11/14/15 1523 11/14/15 2100 11/15/15 0609  BP: 118/69 106/63 139/71 121/62  Pulse: 70 80 84 81  Resp:  20 18 18   Temp:  98 F (36.7 C) 97.8 F (36.6 C) 97.8 F (36.6 C)  TempSrc:  Oral Oral Oral  SpO2:  90% 94% 92%  Weight:      Height:        Wt Readings from Last 3 Encounters:  11/13/15 102.9 kg (226 lb 13.7 oz)  09/23/14 108.9 kg (240 lb)  09/16/14 108.9 kg (  240 lb)    No intake or output data in the 24 hours ending 11/15/15 1204   Physical Exam  Awake Alert, Oriented X 3, No new F.N deficits, Normal affect [place].AT,PERRAL Supple Neck,No JVD, No cervical lymphadenopathy appriciated.  Symmetrical Chest wall movement, Good air movement bilaterally, LLL Rales RRR,No Gallops,Rubs or new Murmurs, No Parasternal Heave +ve B.Sounds, Abd Soft, No tenderness, No organomegaly appriciated, No rebound - guarding or  rigidity. No Cyanosis, Clubbing or edema, No new Rash or bruise      Data Review:    CBC  Recent Labs Lab 11/12/15 0305 11/13/15 0240 11/13/15 1456 11/14/15 0558 11/15/15 0745  WBC 15.3* 13.7* 12.2* 12.5* 11.6*  HGB 11.5* 10.6* 10.8* 10.6* 10.2*  HCT 34.2* 31.6* 32.1* 30.5* 30.0*  PLT 175 147* 150 160 165  MCV 98.6 98.4 98.5 97.1 95.8  MCH 33.1 33.0 33.1 33.8 32.6  MCHC 33.6 33.5 33.6 34.8 34.0  RDW 14.5 14.8 15.0 15.0 15.0  LYMPHSABS 1.6  --   --   --  1.9  MONOABS 0.8  --   --   --  1.1*  EOSABS 0.0  --   --   --  0.1  BASOSABS 0.0  --   --   --  0.0    Chemistries   Recent Labs Lab 11/12/15 0305  11/12/15 2355 11/13/15 0240 11/13/15 0649 11/13/15 1456 11/14/15 0556 11/15/15 0745  NA 116*  < > 128* 127*  128*  --  130* 131* 126*  K 6.9*  < > 4.4 4.6  4.6 4.8 4.5 4.1 5.3*  CL 78*  < > 90* 90*  90*  --  90* 88* 84*  CO2 22  < > 22 24  25   --  27 27 27   GLUCOSE 721*  < > 332* 354*  354*  --  280* 139* 403*  BUN 76*  < > 34* 38*  38*  --  52* 62* 79*  CREATININE 12.35*  < > 8.03* 8.57*  8.60*  --  10.21* 11.30* 13.34*  CALCIUM 7.9*  < > 7.8* 7.8*  7.9*  --  8.1* 8.1* 8.1*  AST 20  --   --   --   --   --   --   --   ALT 16*  --   --   --   --   --   --   --   ALKPHOS 64  --   --   --   --   --   --   --   BILITOT 0.9  --   --   --   --   --   --   --   < > = values in this interval not displayed. ------------------------------------------------------------------------------------------------------------------ No results for input(s): CHOL, HDL, LDLCALC, TRIG, CHOLHDL, LDLDIRECT in the last 72 hours.  Lab Results  Component Value Date   HGBA1C 9.7 (H) 12/26/2013   ------------------------------------------------------------------------------------------------------------------ No results for input(s): TSH, T4TOTAL, T3FREE, THYROIDAB in the last 72 hours.  Invalid input(s):  FREET3 ------------------------------------------------------------------------------------------------------------------ No results for input(s): VITAMINB12, FOLATE, FERRITIN, TIBC, IRON, RETICCTPCT in the last 72 hours.  Coagulation profile No results for input(s): INR, PROTIME in the last 168 hours.  No results for input(s): DDIMER in the last 72 hours.  Cardiac Enzymes  Recent Labs Lab 11/12/15 0715 11/12/15 1350 11/12/15 2111  TROPONINI 0.07* 0.20* 0.49*     Micro Results Recent Results (from the past 240 hour(s))  MRSA PCR  Screening     Status: None   Collection Time: 11/12/15  6:36 AM  Result Value Ref Range Status   MRSA by PCR NEGATIVE NEGATIVE Final    Comment:        The GeneXpert MRSA Assay (FDA approved for NASAL specimens only), is one component of a comprehensive MRSA colonization surveillance program. It is not intended to diagnose MRSA infection nor to guide or monitor treatment for MRSA infections.   Culture, blood (Routine X 2) w Reflex to ID Panel     Status: Abnormal   Collection Time: 11/12/15  9:56 AM  Result Value Ref Range Status   Specimen Description BLOOD LEFT HAND  Final   Special Requests BOTTLES DRAWN AEROBIC AND ANAEROBIC 6CC  Final   Culture  Setup Time   Final    GRAM POSITIVE COCCI Gram Stain Report Called to,Read Back By and Verified With: ROBERTS,T. AT 2017 ON 11/12/2015 BY EVA OBSERVED IN AEROBIC BOTTLE [facility] GRAM POSITIVE COCCI Gram Stain Report Called to,Read Back By and Verified With: WAGONER, R AT 2051 ON 11/12/2015 BY EVA OBSERVED IN ANAEROBIC BOTTLE    Culture STREPTOCOCCUS PNEUMONIAE (A)  Final   Report Status 11/14/2015 FINAL  Final   Organism ID, Bacteria STREPTOCOCCUS PNEUMONIAE  Final      Susceptibility   Streptococcus pneumoniae - MIC*    ERYTHROMYCIN <=0.12 SENSITIVE Sensitive     LEVOFLOXACIN 0.5 SENSITIVE Sensitive     PENICILLIN Value in next row Sensitive      SENSITIVE<=0.06    CEFTRIAXONE Value in next  row Sensitive      SENSITIVE<=0.12    * STREPTOCOCCUS PNEUMONIAE  Blood Culture ID Panel (Reflexed)     Status: Abnormal   Collection Time: 11/12/15  9:56 AM  Result Value Ref Range Status   Enterococcus species NOT DETECTED NOT DETECTED Final   Vancomycin resistance NOT DETECTED NOT DETECTED Final   Listeria monocytogenes NOT DETECTED NOT DETECTED Final   Staphylococcus species NOT DETECTED NOT DETECTED Final   Staphylococcus aureus NOT DETECTED NOT DETECTED Final   Methicillin resistance NOT DETECTED NOT DETECTED Final   Streptococcus species DETECTED (A) NOT DETECTED Final    Comment: CRITICAL RESULT CALLED TO, READ BACK BY AND VERIFIED WITH: TO TROBERTS(RN) BY TECLEVELAND 11/13/2015 AT 3:12AM    Streptococcus agalactiae NOT DETECTED NOT DETECTED Final   Streptococcus pneumoniae DETECTED (A) NOT DETECTED Final    Comment: CRITICAL RESULT CALLED TO, READ BACK BY AND VERIFIED WITH: TO TROBERTS(RN) BY TECLEVELAND 11/13/2015 AT 3:12AM    Streptococcus pyogenes NOT DETECTED NOT DETECTED Final   Acinetobacter baumannii NOT DETECTED NOT DETECTED Final   Enterobacteriaceae species NOT DETECTED NOT DETECTED Final   Enterobacter cloacae complex NOT DETECTED NOT DETECTED Final   Escherichia coli NOT DETECTED NOT DETECTED Final   Klebsiella oxytoca NOT DETECTED NOT DETECTED Final   Klebsiella pneumoniae NOT DETECTED NOT DETECTED Final   Proteus species NOT DETECTED NOT DETECTED Final   Serratia marcescens NOT DETECTED NOT DETECTED Final   Carbapenem resistance NOT DETECTED NOT DETECTED Final   Haemophilus influenzae NOT DETECTED NOT DETECTED Final   Neisseria meningitidis NOT DETECTED NOT DETECTED Final   Pseudomonas aeruginosa NOT DETECTED NOT DETECTED Final   Candida albicans NOT DETECTED NOT DETECTED Final   Candida glabrata NOT DETECTED NOT DETECTED Final   Candida krusei NOT DETECTED NOT DETECTED Final   Candida parapsilosis NOT DETECTED NOT DETECTED Final   Candida tropicalis NOT  DETECTED NOT DETECTED Final  Comment: Performed at [facility]  Culture, blood (Routine X 2) w Reflex to ID Panel     Status: None (Preliminary result)   Collection Time: 11/12/15  1:50 PM  Result Value Ref Range Status   Specimen Description BLOOD LEFT HAND  Final   Special Requests BOTTLES DRAWN AEROBIC AND ANAEROBIC 4CC EACH  Final   Culture NO GROWTH 3 DAYS  Final   Report Status PENDING  Incomplete  Culture, blood (Routine X 2) w Reflex to ID Panel     Status: None (Preliminary result)   Collection Time: 11/14/15 12:35 PM  Result Value Ref Range Status   Specimen Description BLOOD LEFT FOREARM  Final   Special Requests   Final    BOTTLES DRAWN AEROBIC AND ANAEROBIC AEB=12CC ANA=8CC   Culture NO GROWTH < 24 HOURS  Final   Report Status PENDING  Incomplete  Culture, blood (Routine X 2) w Reflex to ID Panel     Status: None (Preliminary result)   Collection Time: 11/14/15 12:40 PM  Result Value Ref Range Status   Specimen Description BLOOD LEFT HAND  Final   Special Requests BOTTLES DRAWN AEROBIC AND ANAEROBIC 6CC  Final   Culture NO GROWTH < 24 HOURS  Final   Report Status PENDING  Incomplete    Radiology Reports  Dg Chest 2 View  Result Date: 11/14/2015 CLINICAL DATA:  Three days of fever. Onset of vomiting today. History of aortic stenosis, CHF, diabetes, former smoker EXAM: CHEST  2 VIEW COMPARISON:  Chest x-ray of November 12, 2015 FINDINGS: The lungs are slightly less well inflated today. The interstitial markings remain mildly prominent. There is no alveolar infiltrate. There is no significant pleural effusion though traces of fluid may be blunting the posterior costophrenic angles. The cardiac silhouette is enlarged. The central pulmonary vascularity is engorged. The trachea is midline. The bony thorax exhibits no acute abnormality. IMPRESSION: Atelectasis or developing pneumonia in the left lower lobe. Superimposed mild CHF. Trace bilateral pleural effusions.  Electronically Signed   By: David  Martinique M.D.   On: 11/14/2015 14:43   Dg Chest 2 View  Result Date: 11/12/2015 CLINICAL DATA:  Right rib pain starting yesterday, now moved to the right arm and neck. No injury EXAM: CHEST  2 VIEW COMPARISON:  05/05/2014 FINDINGS: The heart size and mediastinal contours are within normal limits. Both lungs are clear. The visualized skeletal structures are unremarkable. IMPRESSION: No active cardiopulmonary disease. Electronically Signed   By: Lucienne Capers M.D.   On: 11/12/2015 03:52   Dg Shoulder Right  Result Date: 11/12/2015 CLINICAL DATA:  Rib pain starting yesterday, now moving to the right arm and neck. No injury. EXAM: RIGHT SHOULDER - 2+ VIEW COMPARISON:  None. FINDINGS: Degenerative changes in the acromioclavicular and glenohumeral joints with joint space narrowing and osteophyte formation. Subacromial spur formation is present. Decreased subacromial space may indicate chronic rotator cuff arthropathy. No evidence of acute fracture or dislocation of the right shoulder. Coracoclavicular spaces are normal. IMPRESSION: Degenerative changes in the right shoulder. Subacromial spurring with possible rotator cuff arthropathy. No acute bony abnormalities. Electronically Signed   By: Lucienne Capers M.D.   On: 11/12/2015 03:50   Mr Cervical Spine Wo Contrast  Result Date: 11/13/2015 CLINICAL DATA:  45 year old diabetic hypertensive male with neck pain extending to right shoulder 2 weeks. Anemia. No known injury. EXAM: MRI CERVICAL SPINE WITHOUT CONTRAST TECHNIQUE: Multiplanar, multisequence MR imaging of the cervical spine was performed. No intravenous contrast was administered.  COMPARISON:  None. FINDINGS: Alignment: Normal. Vertebrae: Decreased signal intensity of bone marrow may be related to habitus or patient's underlying anemia. No focal osseous abnormality. 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 Posterior Fossa,  vertebral arteries, paraspinal tissues: Slightly prominent size left level 2 lymph nodes measuring up to 1.2 cm short axis dimension of questionable significance/ etiology. Disc levels: C2-3:  Negative. C3-4:  Negative. C4-5: Minimal right uncinate hypertrophy with minimal right foraminal narrowing. C5-6:  Negative. C6-7: Mild bulge/shallow protrusion right paracentral position with slight indentation right ventral thecal sac approaching but not compressing the right ventral nerve root. C7-T1:  Mild facet degenerative changes. IMPRESSION: C6-7 mild bulge/shallow protrusion right paracentral position with slight indentation right ventral thecal sac approaching but not compressing the right ventral nerve root. C4-5 minimal right foraminal narrowing. 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 Decreased signal intense of bone marrow may be related to patient's habitus or underlying anemia. Slightly prominent size left level 2 lymph nodes measuring up to 1.2 cm short axis dimension of questionable significance or etiology. Electronically Signed   By: Genia Del M.D.   On: 11/13/2015 10:05   Mr Shoulder Right Wo Contrast  Result Date: 11/13/2015 CLINICAL DATA:  Neck pain radiating to the right shoulder. Pain for 2 weeks. EXAM: MRI OF THE RIGHT SHOULDER WITHOUT CONTRAST TECHNIQUE: Multiplanar, multisequence MR imaging of the shoulder was performed. No intravenous contrast was administered. COMPARISON:  None. FINDINGS: Rotator cuff: Complete tear of the supraspinatus tendon with 3.5 cm of retraction Infraspinatus tendon is intact. Teres minor tendon is intact. Subscapularis tendon is intact. Muscles: No atrophy or fatty replacement of nor abnormal signal within, the muscles of the rotator cuff. Biceps long head: Mild tendinosis of the intraarticular portion of the long head of the biceps tendon. Acromioclavicular Joint: Mild arthropathy of the acromioclavicular joint. Type I  acromion. No subacromial/subdeltoid bursal fluid. Glenohumeral Joint: No joint effusion. No chondral defect. Labrum: Grossly intact, but evaluation is limited by lack of intraarticular fluid. Bones: Subcortical reactive marrow changes at the infraspinatus insertion. No other marrow abnormality, fracture or dislocation. Other: None. IMPRESSION: 1. Complete tear of the supraspinatus tendon with 3.5 cm of retraction 2. Mild tendinosis of the intraarticular portion of the long head of the biceps tendon. Electronically Signed   By: Kathreen Devoid   On: 11/13/2015 09:57    Time Spent in minutes  30   Lala Lund K M.D on 11/15/2015 at 12:04 PM  Between 7am to 7pm - Pager - [phone]  After 7pm go to [email or web address] - password [facility]  [facility] -  Office  [phone]

## 2015-11-15 NOTE — Procedures (Signed)
   HEMODIALYSIS TREATMENT NOTE:  4 hour heparin-free dialysis completed via right upper arm AVF (15g ante/retrograde). Goal met: 2.4 liters removed without interruption in ultrafiltration.  All blood was returned and hemostasis was achieved within 10 minutes.   Vancomycin load was rescheduled/given at end of HD.  Report called to Gershon Crane, RN.  Rockwell Alexandria, RN, CDN

## 2015-11-15 NOTE — Care Management Note (Signed)
Case Management Note  Patient Details  Name: Lance [name] MRN: [id number] Date of Birth: July 23, 1970  Subjective/Objective:                  Pt is from home. He is on HD MWF. He goes to [facility] on [address] in [place]. Pt has PCP, transportation and no difficulty according medications. He will need 4 weeks of IV abx. [facility] has been made aware and can provide IV abx with HD sessions going forward. They are aware DC is planned for 11/16/2015.   Action/Plan: No further needs anticipated.   Expected Discharge Date:      11/16/2015            Expected Discharge Plan:  Home/Self Care  In-House Referral:  NA  Discharge planning Services  CM Consult  Post Acute Care Choice:  NA Choice offered to:  NA  DME Arranged:    DME Agency:     HH Arranged:    HH Agency:     Status of Service:  Completed, signed off  If discussed at [employer] of Stay Meetings, dates discussed:    Additional Comments:  Sherald Barge, RN 11/15/2015, 4:14 PM

## 2015-11-15 NOTE — Progress Notes (Signed)
Lance [name]  MRN: [id number]  DOB/AGE: 47/06/1970 47 y.o.  Primary Care Physician:[name], MD  Admit date: 11/12/2015  Chief Complaint:  Chief Complaint  Patient presents with  . Shoulder Pain    S-Pt presented on  11/12/2015 with  Chief Complaint  Patient presents with  . Shoulder Pain      Pt offers no new complaints.    Pt says " My shoulder is better"    Meds . amLODipine  10 mg Oral Daily  . carvedilol  25 mg Oral BID  . diclofenac sodium  4 g Topical QID  . ibuprofen  400 mg Oral Q6H  . insulin aspart  0-9 Units Subcutaneous TID WC  . insulin aspart  12 Units Subcutaneous TID AC  . insulin detemir  35 Units Subcutaneous QHS  . levofloxacin  750 mg Oral Q48H  . sevelamer carbonate  1,600 mg Oral TID WC  . sodium chloride flush  3 mL Intravenous Q12H  . sodium chloride flush  3 mL Intravenous Q12H       Physical Exam: Vital signs in last 24 hours: Temp:  [97.8 F (36.6 C)-98 F (36.7 C)] 97.8 F (36.6 C) (10/04 0609) Pulse Rate:  [70-84] 81 (10/04 0609) Resp:  [18-20] 18 (10/04 0609) BP: (106-139)/(62-71) 121/62 (10/04 0609) SpO2:  [90 %-94 %] 92 % (10/04 0609) Weight change:  Last BM Date: 11/12/15  Intake/Output from previous day: No intake/output data recorded. No intake/output data recorded.   Physical Exam: General- pt is awake,alert, follows commands Resp- No acute REsp distress, NO Rhonchi CVS- S1S2 regular in rate and rhythm GIT- BS+, soft, NT, ND EXT- NO LE Edema, NO Cyanosis Access- Right AVF +  Lab Results: CBC  Recent Labs  11/14/15 0558 11/15/15 0745  WBC 12.5* 11.6*  HGB 10.6* 10.2*  HCT 30.5* 30.0*  PLT 160 165    BMET  Recent Labs  11/14/15 0556 11/15/15 0745  NA 131* 126*  K 4.1 5.3*  CL 88* 84*  CO2 27 27  GLUCOSE 139* 403*  BUN 62* 79*  CREATININE 11.30* 13.34*  CALCIUM 8.1* 8.1*    MICRO Recent Results (from the past 240 hour(s))  MRSA PCR Screening     Status: None   Collection Time:  11/12/15  6:36 AM  Result Value Ref Range Status   MRSA by PCR NEGATIVE NEGATIVE Final    Comment:        The GeneXpert MRSA Assay (FDA approved for NASAL specimens only), is one component of a comprehensive MRSA colonization surveillance program. It is not intended to diagnose MRSA infection nor to guide or monitor treatment for MRSA infections.   Culture, blood (Routine X 2) w Reflex to ID Panel     Status: Abnormal   Collection Time: 11/12/15  9:56 AM  Result Value Ref Range Status   Specimen Description BLOOD LEFT HAND  Final   Special Requests BOTTLES DRAWN AEROBIC AND ANAEROBIC 6CC  Final   Culture  Setup Time   Final    GRAM POSITIVE COCCI Gram Stain Report Called to,Read Back By and Verified With: ROBERTS,T. AT 2017 ON 11/12/2015 BY EVA OBSERVED IN AEROBIC BOTTLE [facility] GRAM POSITIVE COCCI Gram Stain Report Called to,Read Back By and Verified With: WAGONER, R AT 2051 ON 11/12/2015 BY EVA OBSERVED IN ANAEROBIC BOTTLE    Culture STREPTOCOCCUS PNEUMONIAE (A)  Final   Report Status 11/14/2015 FINAL  Final   Organism ID, Bacteria STREPTOCOCCUS PNEUMONIAE  Final  Susceptibility   Streptococcus pneumoniae - MIC*    ERYTHROMYCIN <=0.12 SENSITIVE Sensitive     LEVOFLOXACIN 0.5 SENSITIVE Sensitive     PENICILLIN Value in next row Sensitive      SENSITIVE<=0.06    CEFTRIAXONE Value in next row Sensitive      SENSITIVE<=0.12    * STREPTOCOCCUS PNEUMONIAE  Blood Culture ID Panel (Reflexed)     Status: Abnormal   Collection Time: 11/12/15  9:56 AM  Result Value Ref Range Status   Enterococcus species NOT DETECTED NOT DETECTED Final   Vancomycin resistance NOT DETECTED NOT DETECTED Final   Listeria monocytogenes NOT DETECTED NOT DETECTED Final   Staphylococcus species NOT DETECTED NOT DETECTED Final   Staphylococcus aureus NOT DETECTED NOT DETECTED Final   Methicillin resistance NOT DETECTED NOT DETECTED Final   Streptococcus species DETECTED (A) NOT DETECTED Final     Comment: CRITICAL RESULT CALLED TO, READ BACK BY AND VERIFIED WITH: TO TROBERTS(RN) BY TECLEVELAND 11/13/2015 AT 3:12AM    Streptococcus agalactiae NOT DETECTED NOT DETECTED Final   Streptococcus pneumoniae DETECTED (A) NOT DETECTED Final    Comment: CRITICAL RESULT CALLED TO, READ BACK BY AND VERIFIED WITH: TO TROBERTS(RN) BY TECLEVELAND 11/13/2015 AT 3:12AM    Streptococcus pyogenes NOT DETECTED NOT DETECTED Final   Acinetobacter baumannii NOT DETECTED NOT DETECTED Final   Enterobacteriaceae species NOT DETECTED NOT DETECTED Final   Enterobacter cloacae complex NOT DETECTED NOT DETECTED Final   Escherichia coli NOT DETECTED NOT DETECTED Final   Klebsiella oxytoca NOT DETECTED NOT DETECTED Final   Klebsiella pneumoniae NOT DETECTED NOT DETECTED Final   Proteus species NOT DETECTED NOT DETECTED Final   Serratia marcescens NOT DETECTED NOT DETECTED Final   Carbapenem resistance NOT DETECTED NOT DETECTED Final   Haemophilus influenzae NOT DETECTED NOT DETECTED Final   Neisseria meningitidis NOT DETECTED NOT DETECTED Final   Pseudomonas aeruginosa NOT DETECTED NOT DETECTED Final   Candida albicans NOT DETECTED NOT DETECTED Final   Candida glabrata NOT DETECTED NOT DETECTED Final   Candida krusei NOT DETECTED NOT DETECTED Final   Candida parapsilosis NOT DETECTED NOT DETECTED Final   Candida tropicalis NOT DETECTED NOT DETECTED Final    Comment: Performed at [facility]  Culture, blood (Routine X 2) w Reflex to ID Panel     Status: None (Preliminary result)   Collection Time: 11/12/15  1:50 PM  Result Value Ref Range Status   Specimen Description BLOOD LEFT HAND  Final   Special Requests BOTTLES DRAWN AEROBIC AND ANAEROBIC 4CC EACH  Final   Culture NO GROWTH 3 DAYS  Final   Report Status PENDING  Incomplete  Culture, blood (Routine X 2) w Reflex to ID Panel     Status: None (Preliminary result)   Collection Time: 11/14/15 12:35 PM  Result Value Ref Range Status   Specimen  Description BLOOD LEFT FOREARM  Final   Special Requests   Final    BOTTLES DRAWN AEROBIC AND ANAEROBIC AEB=12CC ANA=8CC   Culture NO GROWTH < 24 HOURS  Final   Report Status PENDING  Incomplete  Culture, blood (Routine X 2) w Reflex to ID Panel     Status: None (Preliminary result)   Collection Time: 11/14/15 12:40 PM  Result Value Ref Range Status   Specimen Description BLOOD LEFT HAND  Final   Special Requests BOTTLES DRAWN AEROBIC AND ANAEROBIC 6CC  Final   Culture NO GROWTH < 24 HOURS  Final   Report Status PENDING  Incomplete  Lab Results  Component Value Date   CALCIUM 8.1 (L) 11/15/2015   PHOS 8.6 (H) 11/14/2015   Alb 2.5 Corrected calcium 8.1+ 1.2=9.3    Impression: 1)Renal  ESRD on HD                Pt is on Monday/Wednesday/Friday schedule                Pt will be dialyzed today   2)HTN  Medication- On Calcium Channel Blockers On Alpha and beta Blockers   3)Anemia In ESRD the goal for HGb is 9--11 On epo with HD   4)CKD Mineral-Bone Disorder Phosphorus not at goal.   On binders Calcium when corrected for low albumin isat goal.  5)ID-admitted with Pneumonia/Septic arthritis Primary MD following  6)Electrolytes  Hyperkalemic   Sec to ESRD + uncontrolled DM  Hyponatremic    Sec to ESRD + uncontrolled hyperglycemia  7)Acid base Co2  at goal     Plan:  Will dialyze today Will use 2 k bath     BHUTANI,MANPREET S 11/15/2015, 10:30 AM

## 2015-11-15 NOTE — Progress Notes (Signed)
Pharmacy Antibiotic Note  Lance [name] is a 45 y.o. male admitted on 11/12/2015 with Strep Pneumo bacteremia.  Pharmacy has been consulted for Vancomycin dosing.  ESR on HD. ID recommended Vancomycin for 4 weeks with HD. His regular dialysis days are Monday Wednesday Friday  Plan: Vancomycin 1563m loading dose, then Vancomycin 1gm IV each HD Labs per protocol Monitor clinical progress and Vancomycin trough as indicated  Height: _0  (190.5 cm) Weight: 226 lb 13.7 oz (102.9 kg) IBW/kg (Calculated) : 84.5  Temp (24hrs), Avg:97.9 F (36.6 C), Min:97.8 F (36.6 C), Max:98 F (36.7 C)   Recent Labs Lab 11/12/15 0305  11/12/15 2355 11/13/15 0240 11/13/15 1456 11/14/15 0556 11/14/15 0558 11/15/15 0745  WBC 15.3*  --   --  13.7* 12.2*  --  12.5* 11.6*  CREATININE 12.35*  < > 8.03* 8.57*  8.60* 10.21* 11.30*  --  13.34*  LATICACIDVEN 1.6  --   --   --   --   --   --   --   < > = values in this interval not displayed.  Estimated Creatinine Clearance: 9 mL/min (by C-G formula based on SCr of 13.34 mg/dL (H)).    Allergies  Allergen Reactions  . Atorvastatin Other (See Comments)    Myalgias  . Minoxidil Other (See Comments)    Fluid retention    Antimicrobials this admission: Vancomycin 10/1 >> 10/2 Zosyn 10/1 >> 10/2 Ceftriaxone 10/2>>10/4 Vancomycin 10/4>>  Dose adjustments this admission: n/a  Microbiology results: 10/1 BCx: strep pneumo S- ceftriaxone, PCN 10/1 MRSA PCR: neg   Thank you for allowing pharmacy to be a part of this patient's care. LIsac Sarna BS PVena [place] BCaliforniaClinical Pharmacist Pager #[phone] 11/15/2015 12:40 PM

## 2015-11-15 NOTE — Progress Notes (Signed)
Patient's blood sugar 69, juice and dinner given blood sugar 74. MD informed, orders given.

## 2015-11-15 NOTE — Evaluation (Signed)
Occupational Therapy Evaluation Patient Details Name: Lance [name] MRN: [id number] DOB: [birth year] Today's Date: 11/15/2015    History of Present Illness Lance [name] is a 45 y.o. male with medical history significant of IDDM, ESRD  Dialysis on M/W/F, HTN, CHF comes in with complaints of right shoulder pain.  Pt has been having problems moving his shoulder it is painful.  No injury to it recently.  Labs were checked and he found to have na of 116 and k of 6.9.  Pt also reports that yesterday he had several episodes on vomiting and diarrhea with no abdominal pain, this has resolved.  Denies any fevers.  He does feel overall weak.  Pt referred for admission for his hyponatremia and hyperkalemia.  MRI shows rotator cuff tear and tendonosis of biceps tendon.    Clinical Impression   Pt awake, alert, oriented x4 this am, reports pain has lessened since pain medication taken at approximately 5am. Pt experiencing right shoulder pain at acromioclavicular joint and across pectoralis region since Friday after dialysis. MRI shows rotator cuff tear and tendonosis of biceps tendon. Pt with limited range of motion and strength of RUE, educated pt on P/ROM exercises for stretching RUE and keeping mobile. Recommend pt follow up with orthopaedic MD post discharge and follow MD's recommendations for therapy versus alternative measures. No further OT services required at this time.     Follow Up Recommendations  Other (comment) (Defer to ortho recommendation for therapy post discharge)    Equipment Recommendations  None recommended by OT       Precautions / Restrictions Precautions Precautions: None Restrictions Weight Bearing Restrictions: No      Mobility Bed Mobility Overal bed mobility: Independent                        ADL Overall ADL's : Independent;At baseline                                             Vision Vision Assessment?: No apparent visual  deficits          Pertinent Vitals/Pain Pain Assessment: 0-10 Pain Score: 3  Pain Location: right shoulder Pain Descriptors / Indicators: Aching ("like a toothache") Pain Intervention(s): Limited activity within patient's tolerance;Monitored during session;Premedicated before session     Hand Dominance Right   Extremity/Trunk Assessment Upper Extremity Assessment Upper Extremity Assessment: RUE deficits/detail RUE Deficits / Details: Shoulder P/ROM and A/ROM: flexion to 50 degrees, abduction to 80 degrees, ER to 60 degrees, IR at 90 degrees. Pt strength is 3-/5 in all planes   Lower Extremity Assessment Lower Extremity Assessment: Overall WFL for tasks assessed   Cervical / Trunk Assessment Cervical / Trunk Assessment: Normal   Communication Communication Communication: No difficulties   Cognition Arousal/Alertness: Awake/alert Behavior During Therapy: WFL for tasks assessed/performed Overall Cognitive Status: Within Functional Limits for tasks assessed                                Home Living Family/patient expects to be discharged to:: Private residence Living Arrangements: Spouse/significant other Available Help at Discharge: Family;Available PRN/intermittently               Bathroom Shower/Tub: Teacher, early years/pre: Standard     Home Equipment: None  Prior Functioning/Environment Level of Independence: Independent        Comments: Pt reports independence in ADL and IADL tasks        OT Problem List: Impaired UE functional use;Pain;Decreased range of motion;Decreased strength    End of Session    Activity Tolerance: Patient tolerated treatment well Patient left: in bed;with call bell/phone within reach   Time: 0906-0918 OT Time Calculation (min): 12 min Charges:  OT General Charges $OT Visit: 1 Procedure OT Evaluation $OT Eval Low Complexity: 1 Procedure Guadelupe Sabin, OTR/L  [phone] 11/15/2015,  9:23 AM

## 2015-11-16 LAB — CBC
HEMATOCRIT: 31.5 % — AB (ref 39.0–52.0)
HEMOGLOBIN: 10.6 g/dL — AB (ref 13.0–17.0)
MCH: 32.3 pg (ref 26.0–34.0)
MCHC: 33.7 g/dL (ref 30.0–36.0)
MCV: 96 fL (ref 78.0–100.0)
Platelets: 252 10*3/uL (ref 150–400)
RBC: 3.28 MIL/uL — ABNORMAL LOW (ref 4.22–5.81)
RDW: 15 % (ref 11.5–15.5)
WBC: 11.6 10*3/uL — AB (ref 4.0–10.5)

## 2015-11-16 LAB — BASIC METABOLIC PANEL
ANION GAP: 12 (ref 5–15)
BUN: 45 mg/dL — ABNORMAL HIGH (ref 6–20)
CALCIUM: 8.8 mg/dL — AB (ref 8.9–10.3)
CHLORIDE: 89 mmol/L — AB (ref 101–111)
CO2: 28 mmol/L (ref 22–32)
Creatinine, Ser: 9.26 mg/dL — ABNORMAL HIGH (ref 0.61–1.24)
GFR calc non Af Amer: 6 mL/min — ABNORMAL LOW (ref >60)
GFR, EST AFRICAN AMERICAN: 7 mL/min — AB (ref >60)
GLUCOSE: 375 mg/dL — AB (ref 65–99)
Potassium: 5.2 mmol/L — ABNORMAL HIGH (ref 3.5–5.1)
Sodium: 129 mmol/L — ABNORMAL LOW (ref 135–145)

## 2015-11-16 LAB — GLUCOSE, CAPILLARY
GLUCOSE-CAPILLARY: 84 mg/dL (ref 65–99)
Glucose-Capillary: 345 mg/dL — ABNORMAL HIGH (ref 65–99)

## 2015-11-16 MED ORDER — DICLOFENAC SODIUM 1 % TD GEL: 4.0000 g | Freq: Four times a day (QID) | TRANSDERMAL | 0 refills | Status: DC

## 2015-11-16 MED ORDER — VANCOMYCIN HCL IN DEXTROSE 1-5 GM/200ML-% IV SOLN: 1000.0000 mg | INTRAVENOUS | 0 refills | Status: DC

## 2015-11-16 NOTE — Discharge Summary (Signed)
Lance [name]:[id number] DOB: 05-25-1970 DOA: 11/12/2015  PCP: Mickie Hillier, MD  Admit date: 11/12/2015  Discharge date: 11/16/2015  Admitted From: Home   Disposition:  Home   Recommendations for Outpatient Follow-up:   Follow up with PCP in 1-2 weeks  PCP Please obtain BMP/CBC, 2 view CXR in 1week,  (see Discharge instructions)   PCP Please follow up on the following pending results: Follow final blood culture results from the repeat cultures drawn on 11/14/2015 which are negative to date   Home Health: None Equipment/Devices: None Consultations: Renal, ID over the phone Discharge Condition: Stable   CODE STATUS: Full   Diet Recommendation: Renal, low carbohydrate   Chief Complaint  Patient presents with  . Shoulder Pain     Brief history of present illness from the day of admission and additional interim summary    45 year old man with type 1 diabetes mellitus, ESRD on Monday, Wednesday and Friday dialysis schedule, nephrotic syndrome, SVT, dyslipidemia, who was admitted to the hospital with nausea vomiting which has since resolved, he also developed some right shoulder pain prior to his admission which per workup in the hospital appears to be due to supraspinatus tear and bicep tendonitis, he later developed fever which was found to be due to pneumonia. He has been placed on appropriate antibiotics with good results.  Hospital issues addressed     1.Nausea vomiting initially due to viral gastroenteritis likely due to strep pneumonia. Initial chest x-ray was negative most likely has this was early in the disease, cultures noted, discussed with ID as he is a dialysis patient, per Dr. Johnnye Sima he will be placed on IV vancomycin for a total of 4 weeks to be given during his dialysis treatments, pharmacy  will be consulted. Start date 12/16/2015.  He is clinically now completely defervesced, afebrile, nontoxic appearance, echocardiogram transthoracic stable, repeat blood cultures drawn on 11/14/2015 are negative to date. Will be discharged on IV vancomycin for 1 month follow with PCP and nephrologist outpatient.  2. Right shoulder pain. Appears to be due to supraspinatus tear along with biceps tendinitis, doubt this is septic arthritis, orthopedics following. Supportive care. Outpatient orthopedics follow-up.  3. ESRD with metabolic bone disease, mild hyponatremia and hypokalemia. Has right arm AV fistula, on M, W, F schedule. Renal on board. Due for dialysis later today.  4. Hypertension and history of PSVT. Continue Norvasc and Coreg.  5. Non ACS pattern Trop rise - chest pain free, likely ESRD and PNA related, EKG & TTE stable, Continue B blocker.  6.DM type I. Continue home regimen and follow-up with PCP for glycemic control.     Discharge diagnosis     Principal Problem:   Hyponatremia Active Problems:   Diabetes mellitus type 1 ([place])   Essential hypertension, benign   Chronic diastolic heart failure (HCC)   ESRD (end stage renal disease) on dialysis ([facility])   Hyperkalemia   Shoulder pain, acute   Nausea & vomiting   Diarrhea    Discharge instructions    Discharge  Instructions    Discharge instructions    Complete by:  As directed    Follow with Primary MD Mickie Hillier, MD in 7 days   Get CBC, CMP, 2 view Chest X ray checked  by Primary MD or SNF MD in 5-7 days ( we routinely change or add medications that can affect your baseline labs and fluid status, therefore we recommend that you get the mentioned basic workup next visit with your PCP, your PCP may decide not to get them or add new tests based on their clinical decision)   Activity: As tolerated with Full fall precautions use walker/cane & assistance as needed   Disposition Home    Diet:  Renal low Carb. -  Check your Weight same time everyday, if you gain over 2 pounds, or you develop in leg swelling, experience more shortness of breath or chest pain, call your Primary MD immediately. Follow Cardiac Low Salt Diet and 1.2 lit/day fluid restriction.  Accuchecks 4 times/day, Once in AM empty stomach and then before each meal. Log in all results and show them to your Prim.MD in 3 days. If any glucose reading is under 80 or above 300 call your Prim MD immidiately. Follow Low glucose instructions for glucose under 80 as instructed.   On your next visit with your primary care physician please Get Medicines reviewed and adjusted.   Please request your Prim.MD to go over all Hospital Tests and Procedure/Radiological results at the follow up, please get all Hospital records sent to your Prim MD by signing hospital release before you go home.   If you experience worsening of your admission symptoms, develop shortness of breath, life threatening emergency, suicidal or homicidal thoughts you must seek medical attention immediately by calling 911 or calling your MD immediately  if symptoms less severe.  You Must read complete instructions/literature along with all the possible adverse reactions/side effects for all the Medicines you take and that have been prescribed to you. Take any new Medicines after you have completely understood and accpet all the possible adverse reactions/side effects.   Do not drive, operate heavy machinery, perform activities at heights, swimming or participation in water activities or provide baby sitting services if your were admitted for syncope or siezures until you have seen by Primary MD or a Neurologist and advised to do so again.  Do not drive when taking Pain medications.    Do not take more than prescribed Pain, Sleep and Anxiety Medications  Special Instructions: If you have smoked or chewed Tobacco  in the last 2 yrs please stop smoking, stop any regular Alcohol  and or  any Recreational drug use.  Wear Seat belts while driving.   Please note  You were cared for by a hospitalist during your hospital stay. If you have any questions about your discharge medications or the care you received while you were in the hospital after you are discharged, you can call the unit and asked to speak with the hospitalist on call if the hospitalist that took care of you is not available. Once you are discharged, your primary care physician will handle any further medical issues. Please note that NO REFILLS for any discharge medications will be authorized once you are discharged, as it is imperative that you return to your primary care physician (or establish a relationship with a primary care physician if you do not have one) for your aftercare needs so that they can reassess your need for medications and monitor your  lab values.   Increase activity slowly    Complete by:  As directed       Discharge Medications     Medication List    TAKE these medications   amLODipine 10 MG tablet Commonly known as:  NORVASC TAKE 1 TABLET BY MOUTH EVERY DAY   carvedilol 25 MG tablet Commonly known as:  COREG TAKE 2 TABLETS BY MOUTH TWICE A DAY What changed:  See the new instructions.   diclofenac sodium 1 % Gel Commonly known as:  VOLTAREN Apply 4 g topically 4 (four) times daily.   insulin aspart 100 UNIT/ML injection Commonly known as:  novoLOG Inject 10 Units into the skin 3 (three) times daily before meals.   insulin detemir 100 UNIT/ML injection Commonly known as:  LEVEMIR Inject 0.2 mLs (20 Units total) into the skin at bedtime. What changed:  how much to take   multivitamin capsule Take 1 capsule by mouth once a week.   vancomycin 1-5 GM/200ML-% Soln Commonly known as:  VANCOCIN Inject 200 mLs (1,000 mg total) into the vein every Monday, Wednesday, and Friday with hemodialysis. Start taking on:  11/17/2015       Follow-up Information    Mickie Hillier, MD.  Schedule an appointment as soon as possible for a visit in 1 week(s).   Specialty:  Family Medicine Contact information: [address] [place] [address] [phone]        DETERDING,JAMES L, MD. Schedule an appointment as soon as possible for a visit in 1 day(s).   Specialty:  Nephrology Why:  For dialysis, you need IV vancomycin with each dialysis treatment for the next 1 month stop date 12/16/2015 Contact information: [address] [place] [address] [phone]        Arther Abbott, MD. Schedule an appointment as soon as possible for a visit in 1 week(s).   Specialties:  Orthopedic Surgery, Radiology Why:  Right shoulder pain Contact information: [address] [place] [address] [phone]           Major procedures and Radiology Reports - PLEASE review detailed and final reports thoroughly  -       TTE  - Upper normal LV chamber size with mild LVH and LVEF approximately 50%. Grossly normal diastolic function. Moderate biatrialenlargement. MAC with mild mitral regurgitation. Moderatelysclerotic aortic valve without stenosis. Trivial tricuspidregurgitation. Elevated CVP. No obvious valvular vegetations.    Dg Chest 2 View  Result Date: 11/15/2015 CLINICAL DATA:  Nausea and vomiting. History of end-stage renal disease on dialysis. EXAM: CHEST  2 VIEW COMPARISON:  11/14/2015 FINDINGS: Enlargement of the cardiac silhouette is unchanged. The lungs remain hypoinflated with pulmonary vascular congestion and patchy bibasilar opacities which have increased from the prior study. Small bilateral pleural effusions are present. No pneumothorax is seen. No acute osseous abnormality is identified. IMPRESSION: Cardiomegaly with pulmonary vascular congestion and increased bibasilar opacities which may reflect edema or pneumonia. Small pleural effusions. Electronically Signed   By: Logan Bores M.D.   On: 11/15/2015 20:32   Dg Chest 2  View  Result Date: 11/14/2015 CLINICAL DATA:  Three days of fever. Onset of vomiting today. History of aortic stenosis, CHF, diabetes, former smoker EXAM: CHEST  2 VIEW COMPARISON:  Chest x-ray of November 12, 2015 FINDINGS: The lungs are slightly less well inflated today. The interstitial markings remain mildly prominent. There is no alveolar infiltrate. There is no significant pleural effusion though traces of fluid may be blunting the posterior  costophrenic angles. The cardiac silhouette is enlarged. The central pulmonary vascularity is engorged. The trachea is midline. The bony thorax exhibits no acute abnormality. IMPRESSION: Atelectasis or developing pneumonia in the left lower lobe. Superimposed mild CHF. Trace bilateral pleural effusions. Electronically Signed   By: David  Martinique M.D.   On: 11/14/2015 14:43   Dg Chest 2 View  Result Date: 11/12/2015 CLINICAL DATA:  Right rib pain starting yesterday, now moved to the right arm and neck. No injury EXAM: CHEST  2 VIEW COMPARISON:  05/05/2014 FINDINGS: The heart size and mediastinal contours are within normal limits. Both lungs are clear. The visualized skeletal structures are unremarkable. IMPRESSION: No active cardiopulmonary disease. Electronically Signed   By: Lucienne Capers M.D.   On: 11/12/2015 03:52   Dg Shoulder Right  Result Date: 11/12/2015 CLINICAL DATA:  Rib pain starting yesterday, now moving to the right arm and neck. No injury. EXAM: RIGHT SHOULDER - 2+ VIEW COMPARISON:  None. FINDINGS: Degenerative changes in the acromioclavicular and glenohumeral joints with joint space narrowing and osteophyte formation. Subacromial spur formation is present. Decreased subacromial space may indicate chronic rotator cuff arthropathy. No evidence of acute fracture or dislocation of the right shoulder. Coracoclavicular spaces are normal. IMPRESSION: Degenerative changes in the right shoulder. Subacromial spurring with possible rotator cuff arthropathy.  No acute bony abnormalities. Electronically Signed   By: Lucienne Capers M.D.   On: 11/12/2015 03:50   Mr Cervical Spine Wo Contrast  Result Date: 11/13/2015 CLINICAL DATA:  45 year old diabetic hypertensive male with neck pain extending to right shoulder 2 weeks. Anemia. No known injury. EXAM: MRI CERVICAL SPINE WITHOUT CONTRAST TECHNIQUE: Multiplanar, multisequence MR imaging of the cervical spine was performed. No intravenous contrast was administered. COMPARISON:  None. FINDINGS: Alignment: Normal. Vertebrae: Decreased signal intensity of bone marrow may be related to habitus or patient's underlying anemia. No focal osseous abnormality. 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 Posterior Fossa, vertebral arteries, paraspinal tissues: Slightly prominent size left level 2 lymph nodes measuring up to 1.2 cm short axis dimension of questionable significance/ etiology. Disc levels: C2-3:  Negative. C3-4:  Negative. C4-5: Minimal right uncinate hypertrophy with minimal right foraminal narrowing. C5-6:  Negative. C6-7: Mild bulge/shallow protrusion right paracentral position with slight indentation right ventral thecal sac approaching but not compressing the right ventral nerve root. C7-T1:  Mild facet degenerative changes. IMPRESSION: C6-7 mild bulge/shallow protrusion right paracentral position with slight indentation right ventral thecal sac approaching but not compressing the right ventral nerve root. C4-5 minimal right foraminal narrowing. 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 Decreased signal intense of bone marrow may be related to patient's habitus or underlying anemia. Slightly prominent size left level 2 lymph nodes measuring up to 1.2 cm short axis dimension of questionable significance or etiology. Electronically Signed   By: Genia Del M.D.   On: 11/13/2015 10:05   Mr Shoulder Right Wo Contrast  Result Date:  11/13/2015 CLINICAL DATA:  Neck pain radiating to the right shoulder. Pain for 2 weeks. EXAM: MRI OF THE RIGHT SHOULDER WITHOUT CONTRAST TECHNIQUE: Multiplanar, multisequence MR imaging of the shoulder was performed. No intravenous contrast was administered. COMPARISON:  None. FINDINGS: Rotator cuff: Complete tear of the supraspinatus tendon with 3.5 cm of retraction Infraspinatus tendon is intact. Teres minor tendon is intact. Subscapularis tendon is intact. Muscles: No atrophy or fatty replacement of nor abnormal signal within, the  muscles of the rotator cuff. Biceps long head: Mild tendinosis of the intraarticular portion of the long head of the biceps tendon. Acromioclavicular Joint: Mild arthropathy of the acromioclavicular joint. Type I acromion. No subacromial/subdeltoid bursal fluid. Glenohumeral Joint: No joint effusion. No chondral defect. Labrum: Grossly intact, but evaluation is limited by lack of intraarticular fluid. Bones: Subcortical reactive marrow changes at the infraspinatus insertion. No other marrow abnormality, fracture or dislocation. Other: None. IMPRESSION: 1. Complete tear of the supraspinatus tendon with 3.5 cm of retraction 2. Mild tendinosis of the intraarticular portion of the long head of the biceps tendon. Electronically Signed   By: Kathreen Devoid   On: 11/13/2015 09:57   Dg Abd 2 Views  Result Date: 11/15/2015 CLINICAL DATA:  Nausea and vomiting EXAM: ABDOMEN - 2 VIEW COMPARISON:  CT 07/31/2015 FINDINGS: No dilated loops of large or small bowel. No pathologic calcifications. Vascular calcification noted in the pelvis. IMPRESSION: No acute abdominal findings. Electronically Signed   By: Suzy Bouchard M.D.   On: 11/15/2015 20:30    Micro Results     Recent Results (from the past 240 hour(s))  MRSA PCR Screening     Status: None   Collection Time: 11/12/15  6:36 AM  Result Value Ref Range Status   MRSA by PCR NEGATIVE NEGATIVE Final    Comment:        The GeneXpert  MRSA Assay (FDA approved for NASAL specimens only), is one component of a comprehensive MRSA colonization surveillance program. It is not intended to diagnose MRSA infection nor to guide or monitor treatment for MRSA infections.   Culture, blood (Routine X 2) w Reflex to ID Panel     Status: Abnormal   Collection Time: 11/12/15  9:56 AM  Result Value Ref Range Status   Specimen Description BLOOD LEFT HAND  Final   Special Requests BOTTLES DRAWN AEROBIC AND ANAEROBIC 6CC  Final   Culture  Setup Time   Final    GRAM POSITIVE COCCI Gram Stain Report Called to,Read Back By and Verified With: ROBERTS,T. AT 2017 ON 11/12/2015 BY EVA OBSERVED IN AEROBIC BOTTLE [facility] GRAM POSITIVE COCCI Gram Stain Report Called to,Read Back By and Verified With: WAGONER, R AT 2051 ON 11/12/2015 BY EVA OBSERVED IN ANAEROBIC BOTTLE    Culture STREPTOCOCCUS PNEUMONIAE (A)  Final   Report Status 11/14/2015 FINAL  Final   Organism ID, Bacteria STREPTOCOCCUS PNEUMONIAE  Final      Susceptibility   Streptococcus pneumoniae - MIC*    ERYTHROMYCIN <=0.12 SENSITIVE Sensitive     LEVOFLOXACIN 0.5 SENSITIVE Sensitive     PENICILLIN Value in next row Sensitive      SENSITIVE<=0.06    CEFTRIAXONE Value in next row Sensitive      SENSITIVE<=0.12    * STREPTOCOCCUS PNEUMONIAE  Blood Culture ID Panel (Reflexed)     Status: Abnormal   Collection Time: 11/12/15  9:56 AM  Result Value Ref Range Status   Enterococcus species NOT DETECTED NOT DETECTED Final   Vancomycin resistance NOT DETECTED NOT DETECTED Final   Listeria monocytogenes NOT DETECTED NOT DETECTED Final   Staphylococcus species NOT DETECTED NOT DETECTED Final   Staphylococcus aureus NOT DETECTED NOT DETECTED Final   Methicillin resistance NOT DETECTED NOT DETECTED Final   Streptococcus species DETECTED (A) NOT DETECTED Final    Comment: CRITICAL RESULT CALLED TO, READ BACK BY AND VERIFIED WITH: TO TROBERTS(RN) BY TEC[place]AND 11/13/2015 AT 3:12AM     Streptococcus agalactiae NOT DETECTED NOT DETECTED  Final   Streptococcus pneumoniae DETECTED (A) NOT DETECTED Final    Comment: CRITICAL RESULT CALLED TO, READ BACK BY AND VERIFIED WITH: TO TROBERTS(RN) BY TECLEVELAND 11/13/2015 AT 3:12AM    Streptococcus pyogenes NOT DETECTED NOT DETECTED Final   Acinetobacter baumannii NOT DETECTED NOT DETECTED Final   Enterobacteriaceae species NOT DETECTED NOT DETECTED Final   Enterobacter cloacae complex NOT DETECTED NOT DETECTED Final   Escherichia coli NOT DETECTED NOT DETECTED Final   Klebsiella oxytoca NOT DETECTED NOT DETECTED Final   Klebsiella pneumoniae NOT DETECTED NOT DETECTED Final   Proteus species NOT DETECTED NOT DETECTED Final   Serratia marcescens NOT DETECTED NOT DETECTED Final   Carbapenem resistance NOT DETECTED NOT DETECTED Final   Haemophilus influenzae NOT DETECTED NOT DETECTED Final   Neisseria meningitidis NOT DETECTED NOT DETECTED Final   Pseudomonas aeruginosa NOT DETECTED NOT DETECTED Final   Candida albicans NOT DETECTED NOT DETECTED Final   Candida glabrata NOT DETECTED NOT DETECTED Final   Candida krusei NOT DETECTED NOT DETECTED Final   Candida parapsilosis NOT DETECTED NOT DETECTED Final   Candida tropicalis NOT DETECTED NOT DETECTED Final    Comment: Performed at [facility]  Culture, blood (Routine X 2) w Reflex to ID Panel     Status: None (Preliminary result)   Collection Time: 11/12/15  1:50 PM  Result Value Ref Range Status   Specimen Description BLOOD LEFT HAND  Final   Special Requests BOTTLES DRAWN AEROBIC AND ANAEROBIC 4CC EACH  Final   Culture NO GROWTH 4 DAYS  Final   Report Status PENDING  Incomplete  Culture, blood (Routine X 2) w Reflex to ID Panel     Status: None (Preliminary result)   Collection Time: 11/14/15 12:35 PM  Result Value Ref Range Status   Specimen Description BLOOD LEFT FOREARM  Final   Special Requests   Final    BOTTLES DRAWN AEROBIC AND ANAEROBIC AEB=12CC ANA=8CC    Culture NO GROWTH 2 DAYS  Final   Report Status PENDING  Incomplete  Culture, blood (Routine X 2) w Reflex to ID Panel     Status: None (Preliminary result)   Collection Time: 11/14/15 12:40 PM  Result Value Ref Range Status   Specimen Description BLOOD LEFT HAND  Final   Special Requests BOTTLES DRAWN AEROBIC AND ANAEROBIC 6CC  Final   Culture NO GROWTH 2 DAYS  Final   Report Status PENDING  Incomplete    Today   Subjective    Anita Mcadory today has no headache,no chest abdominal pain,no new weakness tingling or numbness, feels much better wants to go home today.     Objective   Blood pressure 115/63, pulse 88, temperature 98.2 F (36.8 C), temperature source Oral, resp. rate 15, height 6\' 3"  (1.905 m), weight 106.6 kg (235 lb 0.2 oz), SpO2 93 %.   Intake/Output Summary (Last 24 hours) at 11/16/15 0915 Last data filed at 11/15/15 1814  Gross per 24 hour  Intake              740 ml  Output             2400 ml  Net            -1660 ml    Exam Awake Alert, Oriented x 3, No new F.N deficits, Normal affect [place].AT,PERRAL Supple Neck,No JVD, No cervical lymphadenopathy appriciated.  Symmetrical Chest wall movement, Good air movement bilaterally, CTAB RRR,No Gallops,Rubs or new Murmurs, No Parasternal Heave +ve  B.Sounds, Abd Soft, Non tender, No organomegaly appriciated, No rebound -guarding or rigidity. No Cyanosis, Clubbing or edema, No new Rash or bruise   Data Review   CBC w Diff:  Lab Results  Component Value Date   WBC 11.6 (H) 11/16/2015   HGB 10.6 (L) 11/16/2015   HCT 31.5 (L) 11/16/2015   PLT 252 11/16/2015   LYMPHOPCT 17 11/15/2015   MONOPCT 9 11/15/2015   EOSPCT 1 11/15/2015   BASOPCT 0 11/15/2015    CMP:  Lab Results  Component Value Date   NA 129 (L) 11/16/2015   K 5.2 (H) 11/16/2015   CL 89 (L) 11/16/2015   CO2 28 11/16/2015   BUN 45 (H) 11/16/2015   CREATININE 9.26 (H) 11/16/2015   CREATININE 2.21 (H) 03/26/2013   PROT 8.0 11/12/2015    ALBUMIN 2.4 (L) 11/15/2015   BILITOT 0.9 11/12/2015   ALKPHOS 64 11/12/2015   AST 20 11/12/2015   ALT 16 (L) 11/12/2015  .   Total Time in preparing paper work, data evaluation and todays exam - 35 minutes  Thurnell Lose M.D on 11/16/2015 at [place]  [phone]

## 2015-11-16 NOTE — Progress Notes (Signed)
Subjective: Interval History: Patient is feeling much better. Presently he denies any difficulty breathing.  Objective: Vital signs in last 24 hours: Temp:  [97.7 F (36.5 C)-98.2 F (36.8 C)] 98.2 F (36.8 C) (10/05 0724) Pulse Rate:  [71-98] 88 (10/05 0724) Resp:  [15-20] 15 (10/05 0724) BP: (115-157)/(63-80) 115/63 (10/05 0724) SpO2:  [92 %-95 %] 93 % (10/05 0724) Weight:  [106.2 kg (234 lb 2.1 oz)-106.6 kg (235 lb 0.2 oz)] 106.6 kg (235 lb 0.2 oz) (10/05 0724) Weight change:   Intake/Output from previous day: 10/04 0701 - 10/05 0700 In: 740 [P.O.:240; IV Piggyback:500] Out: 2400  Intake/Output this shift: Total I/O In: 320 [P.O.:320] Out: -   Generally patient is alert and in no apparent distress Chest is clear to auscultation Heart exam regular rate and rhythm. No murmur Extremities: No edema. He has some tenderness of his right shoulder. Pain also when trying to raise it. Patient has a fistula on the same but has this moment doesn't have any sign of inflammation.  Lab Results:  Recent Labs  11/15/15 0745 11/16/15 0608  WBC 11.6* 11.6*  HGB 10.2* 10.6*  HCT 30.0* 31.5*  PLT 165 252   BMET:   Recent Labs  11/15/15 0745 11/16/15 0608  NA 126* 129*  K 5.3* 5.2*  CL 84* 89*  CO2 27 28  GLUCOSE 403* 375*  BUN 79* 45*  CREATININE 13.34* 9.26*  CALCIUM 8.1* 8.8*   No results for input(s): PTH in the last 72 hours. Iron Studies: No results for input(s): IRON, TIBC, TRANSFERRIN, FERRITIN in the last 72 hours.  Studies/Results: Dg Chest 2 View  Result Date: 11/15/2015 CLINICAL DATA:  Nausea and vomiting. History of end-stage renal disease on dialysis. EXAM: CHEST  2 VIEW COMPARISON:  11/14/2015 FINDINGS: Enlargement of the cardiac silhouette is unchanged. The lungs remain hypoinflated with pulmonary vascular congestion and patchy bibasilar opacities which have increased from the prior study. Small bilateral pleural effusions are present. No pneumothorax is  seen. No acute osseous abnormality is identified. IMPRESSION: Cardiomegaly with pulmonary vascular congestion and increased bibasilar opacities which may reflect edema or pneumonia. Small pleural effusions. Electronically Signed   By: Logan Bores M.D.   On: 11/15/2015 20:32   Dg Chest 2 View  Result Date: 11/14/2015 CLINICAL DATA:  Three days of fever. Onset of vomiting today. History of aortic stenosis, CHF, diabetes, former smoker EXAM: CHEST  2 VIEW COMPARISON:  Chest x-ray of November 12, 2015 FINDINGS: The lungs are slightly less well inflated today. The interstitial markings remain mildly prominent. There is no alveolar infiltrate. There is no significant pleural effusion though traces of fluid may be blunting the posterior costophrenic angles. The cardiac silhouette is enlarged. The central pulmonary vascularity is engorged. The trachea is midline. The bony thorax exhibits no acute abnormality. IMPRESSION: Atelectasis or developing pneumonia in the left lower lobe. Superimposed mild CHF. Trace bilateral pleural effusions. Electronically Signed   By: David  Martinique M.D.   On: 11/14/2015 14:43   Dg Abd 2 Views  Result Date: 11/15/2015 CLINICAL DATA:  Nausea and vomiting EXAM: ABDOMEN - 2 VIEW COMPARISON:  CT 07/31/2015 FINDINGS: No dilated loops of large or small bowel. No pathologic calcifications. Vascular calcification noted in the pelvis. IMPRESSION: No acute abdominal findings. Electronically Signed   By: Suzy Bouchard M.D.   On: 11/15/2015 20:30    I have reviewed the patient's current medications.  Assessment/Plan: Problem #1 right shoulder pain: Patient is feeling much better. Problem #2 end-stage  renal disease: His status post hemodialysis yesterday. He is asymptomatic. Problem #3 hyperkalemia: His potassium has corrected . Patient advised to cut down his potassium intake. Problem #4 metabolic bone disease: His calcium  is in range but his phosphorus is high. Presently started on a  binder. Problem #5 hypertension: His blood pressure is reasonably controlled Problem #6 diabetes: His blood sugar has improved but still high. Problem #7 history of anemia: His hemoglobin is within our target goal. Plan: Patient presently is stable. His next dialysis will be tomorrow and he will go to his regular center as an outpatient.   LOS: 4 days   Ladina Shutters S 11/16/2015,10:04 AM

## 2015-11-16 NOTE — Discharge Instructions (Signed)
Follow with Primary MD Mickie Hillier, MD in 7 days   Get CBC, CMP, 2 view Chest X ray checked  by Primary MD or SNF MD in 5-7 days ( we routinely change or add medications that can affect your baseline labs and fluid status, therefore we recommend that you get the mentioned basic workup next visit with your PCP, your PCP may decide not to get them or add new tests based on their clinical decision)   Activity: As tolerated with Full fall precautions use walker/cane & assistance as needed   Disposition Home    Diet:  Renal low Carb. - Check your Weight same time everyday, if you gain over 2 pounds, or you develop in leg swelling, experience more shortness of breath or chest pain, call your Primary MD immediately. Follow Cardiac Low Salt Diet and 1.2 lit/day fluid restriction.  Accuchecks 4 times/day, Once in AM empty stomach and then before each meal. Log in all results and show them to your Prim.MD in 3 days. If any glucose reading is under 80 or above 300 call your Prim MD immidiately. Follow Low glucose instructions for glucose under 80 as instructed.   On your next visit with your primary care physician please Get Medicines reviewed and adjusted.   Please request your Prim.MD to go over all Hospital Tests and Procedure/Radiological results at the follow up, please get all Hospital records sent to your Prim MD by signing hospital release before you go home.   If you experience worsening of your admission symptoms, develop shortness of breath, life threatening emergency, suicidal or homicidal thoughts you must seek medical attention immediately by calling 911 or calling your MD immediately  if symptoms less severe.  You Must read complete instructions/literature along with all the possible adverse reactions/side effects for all the Medicines you take and that have been prescribed to you. Take any new Medicines after you have completely understood and accpet all the possible adverse  reactions/side effects.   Do not drive, operate heavy machinery, perform activities at heights, swimming or participation in water activities or provide baby sitting services if your were admitted for syncope or siezures until you have seen by Primary MD or a Neurologist and advised to do so again.  Do not drive when taking Pain medications.    Do not take more than prescribed Pain, Sleep and Anxiety Medications  Special Instructions: If you have smoked or chewed Tobacco  in the last 2 yrs please stop smoking, stop any regular Alcohol  and or any Recreational drug use.  Wear Seat belts while driving.   Please note  You were cared for by a hospitalist during your hospital stay. If you have any questions about your discharge medications or the care you received while you were in the hospital after you are discharged, you can call the unit and asked to speak with the hospitalist on call if the hospitalist that took care of you is not available. Once you are discharged, your primary care physician will handle any further medical issues. Please note that NO REFILLS for any discharge medications will be authorized once you are discharged, as it is imperative that you return to your primary care physician (or establish a relationship with a primary care physician if you do not have one) for your aftercare needs so that they can reassess your need for medications and monitor your lab values.

## 2015-11-16 NOTE — Progress Notes (Signed)
Patient with orders to be discharge home. Discharge instructions given, patient verbalized understanding. Patient stable. Patient left in private vehicle with family.  

## 2015-11-17 DIAGNOSIS — N186 End stage renal disease: Secondary | ICD-10-CM | POA: Diagnosis not present

## 2015-11-17 LAB — CULTURE, BLOOD (ROUTINE X 2): Culture: NO GROWTH

## 2015-11-19 LAB — CULTURE, BLOOD (ROUTINE X 2)
CULTURE: NO GROWTH
CULTURE: NO GROWTH

## 2015-11-20 DIAGNOSIS — N186 End stage renal disease: Secondary | ICD-10-CM | POA: Diagnosis not present

## 2015-11-22 DIAGNOSIS — N186 End stage renal disease: Secondary | ICD-10-CM | POA: Diagnosis not present

## 2015-11-24 ENCOUNTER — Ambulatory Visit: Payer: BLUE CROSS/BLUE SHIELD | Admitting: Orthopedic Surgery

## 2015-11-24 DIAGNOSIS — N186 End stage renal disease: Secondary | ICD-10-CM | POA: Diagnosis not present

## 2015-11-27 DIAGNOSIS — N186 End stage renal disease: Secondary | ICD-10-CM | POA: Diagnosis not present

## 2015-11-28 ENCOUNTER — Ambulatory Visit (INDEPENDENT_AMBULATORY_CARE_PROVIDER_SITE_OTHER): Payer: BLUE CROSS/BLUE SHIELD | Admitting: Family Medicine

## 2015-11-28 ENCOUNTER — Inpatient Hospital Stay (HOSPITAL_COMMUNITY)
Admission: EM | Admit: 2015-11-28 | Discharge: 2015-12-05 | DRG: 186 | Disposition: A | Discharge status: Home / Self Care | Payer: Medicare Other | Attending: Internal Medicine | Admitting: Internal Medicine

## 2015-11-28 ENCOUNTER — Encounter: Payer: Self-pay | Admitting: Family Medicine

## 2015-11-28 ENCOUNTER — Emergency Department (HOSPITAL_COMMUNITY): Payer: Medicare Other

## 2015-11-28 ENCOUNTER — Other Ambulatory Visit (HOSPITAL_COMMUNITY)
Admission: RE | Admit: 2015-11-28 | Discharge: 2015-11-28 | Disposition: A | Discharge status: Home / Self Care | Payer: BLUE CROSS/BLUE SHIELD | Source: Ambulatory Visit | Attending: Family Medicine | Admitting: Family Medicine

## 2015-11-28 ENCOUNTER — Other Ambulatory Visit: Payer: Self-pay

## 2015-11-28 ENCOUNTER — Encounter (HOSPITAL_COMMUNITY): Payer: Self-pay | Admitting: Emergency Medicine

## 2015-11-28 ENCOUNTER — Ambulatory Visit (HOSPITAL_COMMUNITY)
Admission: RE | Admit: 2015-11-28 | Discharge: 2015-11-28 | Disposition: A | Discharge status: Home / Self Care | Payer: BLUE CROSS/BLUE SHIELD | Source: Ambulatory Visit | Attending: Family Medicine | Admitting: Family Medicine

## 2015-11-28 ENCOUNTER — Ambulatory Visit: Payer: BLUE CROSS/BLUE SHIELD | Admitting: Orthopedic Surgery

## 2015-11-28 VITALS — BP 148/80 | Temp 99.6°F | Ht 75.0 in | Wt 222.6 lb

## 2015-11-28 DIAGNOSIS — J869 Pyothorax without fistula: Secondary | ICD-10-CM | POA: Diagnosis present

## 2015-11-28 DIAGNOSIS — N2581 Secondary hyperparathyroidism of renal origin: Secondary | ICD-10-CM | POA: Diagnosis not present

## 2015-11-28 DIAGNOSIS — Z4682 Encounter for fitting and adjustment of non-vascular catheter: Secondary | ICD-10-CM | POA: Diagnosis not present

## 2015-11-28 DIAGNOSIS — E1022 Type 1 diabetes mellitus with diabetic chronic kidney disease: Secondary | ICD-10-CM | POA: Diagnosis not present

## 2015-11-28 DIAGNOSIS — J189 Pneumonia, unspecified organism: Secondary | ICD-10-CM | POA: Diagnosis present

## 2015-11-28 DIAGNOSIS — J690 Pneumonitis due to inhalation of food and vomit: Secondary | ICD-10-CM | POA: Insufficient documentation

## 2015-11-28 DIAGNOSIS — E8889 Other specified metabolic disorders: Secondary | ICD-10-CM | POA: Diagnosis not present

## 2015-11-28 DIAGNOSIS — I10 Essential (primary) hypertension: Secondary | ICD-10-CM | POA: Diagnosis not present

## 2015-11-28 DIAGNOSIS — I120 Hypertensive chronic kidney disease with stage 5 chronic kidney disease or end stage renal disease: Secondary | ICD-10-CM | POA: Diagnosis not present

## 2015-11-28 DIAGNOSIS — Z87891 Personal history of nicotine dependence: Secondary | ICD-10-CM | POA: Diagnosis not present

## 2015-11-28 DIAGNOSIS — E1029 Type 1 diabetes mellitus with other diabetic kidney complication: Secondary | ICD-10-CM | POA: Diagnosis not present

## 2015-11-28 DIAGNOSIS — E785 Hyperlipidemia, unspecified: Secondary | ICD-10-CM | POA: Diagnosis present

## 2015-11-28 DIAGNOSIS — E871 Hypo-osmolality and hyponatremia: Secondary | ICD-10-CM | POA: Diagnosis not present

## 2015-11-28 DIAGNOSIS — D631 Anemia in chronic kidney disease: Secondary | ICD-10-CM | POA: Diagnosis not present

## 2015-11-28 DIAGNOSIS — Z794 Long term (current) use of insulin: Secondary | ICD-10-CM

## 2015-11-28 DIAGNOSIS — Z96652 Presence of left artificial knee joint: Secondary | ICD-10-CM | POA: Diagnosis present

## 2015-11-28 DIAGNOSIS — Z8701 Personal history of pneumonia (recurrent): Secondary | ICD-10-CM

## 2015-11-28 DIAGNOSIS — I132 Hypertensive heart and chronic kidney disease with heart failure and with stage 5 chronic kidney disease, or end stage renal disease: Secondary | ICD-10-CM | POA: Diagnosis not present

## 2015-11-28 DIAGNOSIS — D649 Anemia, unspecified: Secondary | ICD-10-CM | POA: Diagnosis not present

## 2015-11-28 DIAGNOSIS — R05 Cough: Secondary | ICD-10-CM | POA: Diagnosis present

## 2015-11-28 DIAGNOSIS — Z888 Allergy status to other drugs, medicaments and biological substances status: Secondary | ICD-10-CM | POA: Diagnosis not present

## 2015-11-28 DIAGNOSIS — E108 Type 1 diabetes mellitus with unspecified complications: Secondary | ICD-10-CM | POA: Diagnosis not present

## 2015-11-28 DIAGNOSIS — J90 Pleural effusion, not elsewhere classified: Secondary | ICD-10-CM | POA: Insufficient documentation

## 2015-11-28 DIAGNOSIS — Z992 Dependence on renal dialysis: Secondary | ICD-10-CM

## 2015-11-28 DIAGNOSIS — E860 Dehydration: Secondary | ICD-10-CM | POA: Diagnosis present

## 2015-11-28 DIAGNOSIS — Z9689 Presence of other specified functional implants: Secondary | ICD-10-CM

## 2015-11-28 DIAGNOSIS — D7589 Other specified diseases of blood and blood-forming organs: Secondary | ICD-10-CM | POA: Diagnosis not present

## 2015-11-28 DIAGNOSIS — Y95 Nosocomial condition: Secondary | ICD-10-CM | POA: Diagnosis present

## 2015-11-28 DIAGNOSIS — K219 Gastro-esophageal reflux disease without esophagitis: Secondary | ICD-10-CM | POA: Diagnosis not present

## 2015-11-28 DIAGNOSIS — Z8249 Family history of ischemic heart disease and other diseases of the circulatory system: Secondary | ICD-10-CM | POA: Diagnosis not present

## 2015-11-28 DIAGNOSIS — I5032 Chronic diastolic (congestive) heart failure: Secondary | ICD-10-CM | POA: Diagnosis present

## 2015-11-28 DIAGNOSIS — Z833 Family history of diabetes mellitus: Secondary | ICD-10-CM

## 2015-11-28 DIAGNOSIS — N186 End stage renal disease: Secondary | ICD-10-CM | POA: Diagnosis present

## 2015-11-28 DIAGNOSIS — Z79899 Other long term (current) drug therapy: Secondary | ICD-10-CM

## 2015-11-28 DIAGNOSIS — Z9889 Other specified postprocedural states: Secondary | ICD-10-CM

## 2015-11-28 DIAGNOSIS — I959 Hypotension, unspecified: Secondary | ICD-10-CM | POA: Diagnosis not present

## 2015-11-28 HISTORY — DX: Dependence on renal dialysis: Z99.2

## 2015-11-28 HISTORY — DX: Pneumonia, unspecified organism: J18.9

## 2015-11-28 HISTORY — DX: Gastro-esophageal reflux disease without esophagitis: K21.9

## 2015-11-28 HISTORY — DX: Pleural effusion, not elsewhere classified: J90

## 2015-11-28 HISTORY — DX: Dependence on renal dialysis: N18.6

## 2015-11-28 HISTORY — DX: Unspecified osteoarthritis, unspecified site: M19.90

## 2015-11-28 LAB — CBC WITH DIFFERENTIAL/PLATELET
BASOS ABS: 0.1 10*3/uL (ref 0.0–0.1)
Basophils Absolute: 0.1 10*3/uL (ref 0.0–0.1)
Basophils Relative: 1 %
Basophils Relative: 1 %
Eosinophils Absolute: 0.2 10*3/uL (ref 0.0–0.7)
Eosinophils Absolute: 0.1 10*3/uL (ref 0.0–0.7)
Eosinophils Relative: 2 %
Eosinophils Relative: 1 %
HEMATOCRIT: 32.2 % — AB (ref 39.0–52.0)
HEMATOCRIT: 32 % — AB (ref 39.0–52.0)
HEMOGLOBIN: 10.7 g/dL — AB (ref 13.0–17.0)
Hemoglobin: 10.8 g/dL — ABNORMAL LOW (ref 13.0–17.0)
LYMPHS ABS: 2.2 10*3/uL (ref 0.7–4.0)
LYMPHS PCT: 19 %
LYMPHS PCT: 17 %
Lymphs Abs: 2.6 10*3/uL (ref 0.7–4.0)
MCH: 32.1 pg (ref 26.0–34.0)
MCH: 32.6 pg (ref 26.0–34.0)
MCHC: 33.2 g/dL (ref 30.0–36.0)
MCHC: 33.8 g/dL (ref 30.0–36.0)
MCV: 96.7 fL (ref 78.0–100.0)
MCV: 96.7 fL (ref 78.0–100.0)
MONO ABS: 1.4 10*3/uL — AB (ref 0.1–1.0)
MONO ABS: 1.8 10*3/uL — AB (ref 0.1–1.0)
MONOS PCT: 14 %
Monocytes Relative: 11 %
NEUTROS ABS: 9.3 10*3/uL — AB (ref 1.7–7.7)
NEUTROS ABS: 8.6 10*3/uL — AB (ref 1.7–7.7)
Neutrophils Relative %: 67 %
Neutrophils Relative %: 67 %
Platelets: 589 10*3/uL — ABNORMAL HIGH (ref 150–400)
Platelets: 538 10*3/uL — ABNORMAL HIGH (ref 150–400)
RBC: 3.33 MIL/uL — AB (ref 4.22–5.81)
RBC: 3.31 MIL/uL — ABNORMAL LOW (ref 4.22–5.81)
RDW: 15.3 % (ref 11.5–15.5)
RDW: 15.3 % (ref 11.5–15.5)
WBC: 13.7 10*3/uL — ABNORMAL HIGH (ref 4.0–10.5)
WBC: 12.9 10*3/uL — ABNORMAL HIGH (ref 4.0–10.5)

## 2015-11-28 LAB — COMPREHENSIVE METABOLIC PANEL
ALK PHOS: 119 U/L (ref 38–126)
ALT: 14 U/L — ABNORMAL LOW (ref 17–63)
ANION GAP: 15 (ref 5–15)
AST: 15 U/L (ref 15–41)
Albumin: 2.5 g/dL — ABNORMAL LOW (ref 3.5–5.0)
BILIRUBIN TOTAL: 1.3 mg/dL — AB (ref 0.3–1.2)
BUN: 41 mg/dL — AB (ref 6–20)
CALCIUM: 7.8 mg/dL — AB (ref 8.9–10.3)
CO2: 27 mmol/L (ref 22–32)
Chloride: 89 mmol/L — ABNORMAL LOW (ref 101–111)
Creatinine, Ser: 10.49 mg/dL — ABNORMAL HIGH (ref 0.61–1.24)
GFR calc Af Amer: 6 mL/min — ABNORMAL LOW (ref >60)
GFR, EST NON AFRICAN AMERICAN: 5 mL/min — AB (ref >60)
Glucose, Bld: 322 mg/dL — ABNORMAL HIGH (ref 65–99)
POTASSIUM: 4.6 mmol/L (ref 3.5–5.1)
Sodium: 131 mmol/L — ABNORMAL LOW (ref 135–145)
TOTAL PROTEIN: 7.9 g/dL (ref 6.5–8.1)

## 2015-11-28 LAB — LACTIC ACID, PLASMA: LACTIC ACID, VENOUS: 1.1 mmol/L (ref 0.5–1.9)

## 2015-11-28 LAB — GLUCOSE, CAPILLARY: Glucose-Capillary: 340 mg/dL — ABNORMAL HIGH (ref 65–99)

## 2015-11-28 MED ORDER — IOPAMIDOL (ISOVUE-300) INJECTION 61%
75.0000 mL | Freq: Once | INTRAVENOUS | Status: AC | PRN
  Administered 2015-11-28: 75 mL via INTRAVENOUS

## 2015-11-28 MED ORDER — INSULIN ASPART 100 UNIT/ML ~~LOC~~ SOLN
0.0000 [IU] | Freq: Every day | SUBCUTANEOUS | Status: DC
  Administered 2015-11-28: 4 [IU] via SUBCUTANEOUS
  Administered 2015-11-29: 3 [IU] via SUBCUTANEOUS
  Administered 2015-11-30: 2 [IU] via SUBCUTANEOUS
  Administered 2015-12-01: 4 [IU] via SUBCUTANEOUS
  Administered 2015-12-02: 3 [IU] via SUBCUTANEOUS
  Administered 2015-12-03: 2 [IU] via SUBCUTANEOUS

## 2015-11-28 MED ORDER — HYDROCODONE-ACETAMINOPHEN 5-325 MG PO TABS
1.0000 | ORAL_TABLET | ORAL | Status: DC | PRN
  Administered 2015-11-30 – 2015-12-03 (×10): 2 via ORAL
  Filled 2015-11-28 (×9): qty 2

## 2015-11-28 MED ORDER — CEFEPIME HCL 1 G IJ SOLR
1.0000 g | Freq: Once | INTRAMUSCULAR | Status: AC
  Administered 2015-11-28: 1 g via INTRAVENOUS
  Filled 2015-11-28: qty 1

## 2015-11-28 MED ORDER — PIPERACILLIN-TAZOBACTAM 3.375 G IVPB: 3.3750 g | Freq: Two times a day (BID) | INTRAVENOUS | Status: DC

## 2015-11-28 MED ORDER — PIPERACILLIN-TAZOBACTAM 3.375 G IVPB 30 MIN
3.3750 g | INTRAVENOUS | Status: AC
  Administered 2015-11-28: 3.375 g via INTRAVENOUS
  Filled 2015-11-28: qty 50

## 2015-11-28 MED ORDER — SODIUM CHLORIDE 0.9 % IV SOLN: 250.0000 mL | INTRAVENOUS | Status: DC | PRN

## 2015-11-28 MED ORDER — INSULIN ASPART 100 UNIT/ML ~~LOC~~ SOLN
4.0000 [IU] | Freq: Three times a day (TID) | SUBCUTANEOUS | Status: DC
  Administered 2015-11-29 – 2015-12-02 (×7): 4 [IU] via SUBCUTANEOUS

## 2015-11-28 MED ORDER — HEPARIN SODIUM (PORCINE) 5000 UNIT/ML IJ SOLN
5000.0000 [IU] | Freq: Three times a day (TID) | INTRAMUSCULAR | Status: DC
  Administered 2015-11-28 – 2015-11-29 (×2): 5000 [IU] via SUBCUTANEOUS
  Filled 2015-11-28 (×3): qty 1

## 2015-11-28 MED ORDER — VANCOMYCIN HCL 10 G IV SOLR
1500.0000 mg | Freq: Once | INTRAVENOUS | Status: AC
  Administered 2015-11-28: 1500 mg via INTRAVENOUS
  Filled 2015-11-28: qty 1500

## 2015-11-28 MED ORDER — SODIUM CHLORIDE 0.9% FLUSH
3.0000 mL | Freq: Two times a day (BID) | INTRAVENOUS | Status: DC
  Administered 2015-11-28 – 2015-12-04 (×11): 3 mL via INTRAVENOUS

## 2015-11-28 MED ORDER — CEFTRIAXONE SODIUM 1 G IJ SOLR
500.0000 mg | Freq: Once | INTRAMUSCULAR | Status: AC
  Administered 2015-11-28: 500 mg via INTRAMUSCULAR

## 2015-11-28 MED ORDER — SODIUM CHLORIDE 0.9 % IV BOLUS (SEPSIS)
500.0000 mL | Freq: Once | INTRAVENOUS | Status: AC
  Administered 2015-11-28: 500 mL via INTRAVENOUS

## 2015-11-28 MED ORDER — SODIUM CHLORIDE 0.9% FLUSH: 3.0000 mL | INTRAVENOUS | Status: DC | PRN

## 2015-11-28 MED ORDER — INSULIN ASPART 100 UNIT/ML ~~LOC~~ SOLN
0.0000 [IU] | Freq: Three times a day (TID) | SUBCUTANEOUS | Status: DC
  Administered 2015-11-29: 5 [IU] via SUBCUTANEOUS
  Administered 2015-11-29: 3 [IU] via SUBCUTANEOUS
  Administered 2015-11-30: 7 [IU] via SUBCUTANEOUS
  Administered 2015-11-30: 5 [IU] via SUBCUTANEOUS
  Administered 2015-11-30: 9 [IU] via SUBCUTANEOUS
  Administered 2015-12-01: 1 [IU] via SUBCUTANEOUS
  Administered 2015-12-01: 7 [IU] via SUBCUTANEOUS
  Administered 2015-12-02: 2 [IU] via SUBCUTANEOUS
  Administered 2015-12-02: 9 [IU] via SUBCUTANEOUS
  Administered 2015-12-03: 1 [IU] via SUBCUTANEOUS
  Administered 2015-12-03: 5 [IU] via SUBCUTANEOUS
  Administered 2015-12-04: 7 [IU] via SUBCUTANEOUS
  Administered 2015-12-04: 9 [IU] via SUBCUTANEOUS

## 2015-11-28 MED ORDER — ACETAMINOPHEN 325 MG PO TABS
650.0000 mg | ORAL_TABLET | Freq: Four times a day (QID) | ORAL | Status: DC | PRN
  Administered 2015-11-29: 650 mg via ORAL
  Filled 2015-11-28: qty 2

## 2015-11-28 MED ORDER — INSULIN GLARGINE 100 UNIT/ML ~~LOC~~ SOLN
10.0000 [IU] | Freq: Every day | SUBCUTANEOUS | Status: DC
  Administered 2015-11-28 – 2015-11-30 (×3): 10 [IU] via SUBCUTANEOUS
  Filled 2015-11-28 (×5): qty 0.1

## 2015-11-28 MED ORDER — GUAIFENESIN ER 600 MG PO TB12
600.0000 mg | ORAL_TABLET | Freq: Two times a day (BID) | ORAL | Status: DC
  Administered 2015-11-29 – 2015-12-04 (×12): 600 mg via ORAL
  Filled 2015-11-28 (×14): qty 1

## 2015-11-28 MED ORDER — ONDANSETRON 4 MG PO TBDP
4.0000 mg | ORAL_TABLET | Freq: Four times a day (QID) | ORAL | 0 refills | Status: DC | PRN
Start: 2015-11-28 — End: 2016-02-27

## 2015-11-28 NOTE — ED Provider Notes (Signed)
[facility] DEPT Provider Note   CSN: [id number] Arrival date & time: 11/28/15  1438     History   Chief Complaint Chief Complaint  Patient presents with  . Pulmonary infection    sent by Dr. Yehuda Mao office for admission for procedure    HPI Lance [name] is a 45 y.o. male.  Patient presents with cough and low-grade fever. He was recently admitted to the hospital from October 1 through October 5 with an initial diagnosis of hyponatremia and hyperkalemia. In mid hospital course, he developed a left lower lobe pneumonia. He is no longer on antibiotics. He saw his primary care doctor today and was sent to the emergency department for further evaluation. He has end-stage renal disease with dialysis on Monday Wednesday and Friday. Past medical history includes diabetes and hypertension.  He is able to walk      Past Medical History:  Diagnosis Date  . Aortic stenosis    Very mild in 05/2007  . CKD (chronic kidney disease) stage 3, GFR 30-59 ml/min   . Degenerative joint disease    Left TKA  . Diastolic heart failure (HCC)    LVEF 65-70% with grade 2 diastolic dysfunction  . Essential hypertension, benign 2000   LVH  . Hyperlipidemia 2000  . Pneumonia   . PSVT (paroxysmal supraventricular tachycardia) (HCC)    Nonsustained; asymptomatic; diagnosed by event recorder in 2006  . Tobacco abuse, in remission    20 pack years; discontinued in 1985; bronchitic changes on chest x-ray and 2009  . Type 1 diabetes mellitus ([place]) 1990    Patient Active Problem List   Diagnosis Date Noted  . ESRD (end stage renal disease) on dialysis ([place]) 11/12/2015  . Hyperkalemia 11/12/2015  . Shoulder pain, acute 11/12/2015  . Nausea & vomiting 11/12/2015  . Diarrhea 11/12/2015  . Hypertensive urgency 05/05/2014  . Nephrotic syndrome 12/29/2013  . Hyponatremia 12/28/2013  . Acute diastolic heart failure ([place]) 12/28/2013  . Hypoglycemia 12/28/2013  . Dyspnea   . Acute renal failure  syndrome ([place])   . Diabetic ketoacidosis without coma associated with type 1 diabetes mellitus ([place])   . Demand ischemia ([place]) 12/24/2013  . DKA (diabetic ketoacidoses) ([place]) 12/23/2013  . CAP (community acquired pneumonia) 12/23/2013  . Sepsis due to pneumonia ([place]) 12/23/2013  . Metabolic acidemia 08/65/7846  . Acute renal failure ([place]) 12/23/2013  . Diabetic ketoacidosis ([place]) 12/23/2013  . Chronic diastolic heart failure ([place]) 04/14/2013  . PSVT (paroxysmal supraventricular tachycardia) ([place]) 04/14/2013  . Bilateral leg edema 03/26/2013  . Chronic kidney disease, stage III (moderate) 09/29/2011  . Diabetes mellitus type 1 ([place]) 09/26/2010  . Essential hypertension, benign 09/26/2010  . Hyperlipidemia 09/26/2010    Past Surgical History:  Procedure Laterality Date  . AV FISTULA PLACEMENT Right 06/23/2014   Procedure: ARTERIOVENOUS (AV) FISTULA CREATION;  Surgeon: Angelia Mould, MD;  Location: [facility];  Service: Vascular;  Laterality: Right;  . EYE SURGERY    . HERNIA REPAIR     As a child  . WISDOM TOOTH EXTRACTION         Home Medications    Prior to Admission medications   Medication Sig Start Date End Date Taking? Authorizing Provider  amLODipine (NORVASC) 10 MG tablet TAKE 1 TABLET BY MOUTH EVERY DAY Patient taking differently: TAKE 1 TABLET BY MOUTH EVERY DAY IN THE EVENING 07/18/14  Yes [name], MD  carvedilol (COREG) 25 MG tablet TAKE 2 TABLETS BY MOUTH TWICE A DAY Patient  taking differently: TAKE 1 TABLET BY MOUTH TWICE A DAY 01/24/14  Yes Satira Sark, MD  insulin aspart (NOVOLOG) 100 UNIT/ML injection Inject 10 Units into the skin 3 (three) times daily before meals. Patient taking differently: Inject 10-14 Units into the skin 3 (three) times daily before meals. Per sliding scale 12/29/13  Yes Delfina Redwood, MD  ondansetron (ZOFRAN ODT) 4 MG disintegrating tablet Take 1 tablet (4 mg total) by mouth every 6 (six) hours as needed for nausea or  vomiting. 11/28/15  Yes [name], MD  diclofenac sodium (VOLTAREN) 1 % GEL Apply 4 g topically 4 (four) times daily. Patient not taking: Reported on 11/28/2015 11/16/15   Thurnell Lose, MD    Family History Family History  Problem Relation Age of Onset  . Diabetes Father   . Hypertension Father   . Diabetes Mother   . Hypertension Mother     Social History Social History  Substance Use Topics  . Smoking status: Never Smoker  . Smokeless tobacco: Never Used  . Alcohol use No     Allergies   Atorvastatin and Minoxidil   Review of Systems Review of Systems  All other systems reviewed and are negative.    Physical Exam Updated Vital Signs BP 146/85   Pulse 99   Temp 98.1 F (36.7 C) (Oral)   Resp 20   Ht 6\' 3"  (1.905 m)   Wt 222 lb (100.7 kg)   SpO2 96%   BMI 27.75 kg/m   Physical Exam  Constitutional: He is oriented to person, place, and time.  No obvious dyspnea.  HENT:  Head: Normocephalic and atraumatic.  Eyes: Conjunctivae are normal.  Neck: Neck supple.  Cardiovascular: Normal rate and regular rhythm.   Pulmonary/Chest: Effort normal and breath sounds normal.  Abdominal: Soft. Bowel sounds are normal.  Musculoskeletal: Normal range of motion.  Neurological: He is alert and oriented to person, place, and time.  Skin: Skin is warm and dry.  Psychiatric: He has a normal mood and affect. His behavior is normal.  Nursing note and vitals reviewed.    ED Treatments / Results  Labs (all labs ordered are listed, but only abnormal results are displayed) Labs Reviewed  CBC WITH DIFFERENTIAL/PLATELET - Abnormal; Notable for the following:       Result Value   WBC 12.9 (*)    RBC 3.31 (*)    Hemoglobin 10.8 (*)    HCT 32.0 (*)    Platelets 538 (*)    Neutro Abs 8.6 (*)    Monocytes Absolute 1.8 (*)    All other components within normal limits  COMPREHENSIVE METABOLIC PANEL - Abnormal; Notable for the following:    Sodium 131 (*)     Chloride 89 (*)    Glucose, Bld 322 (*)    BUN 41 (*)    Creatinine, Ser 10.49 (*)    Calcium 7.8 (*)    Albumin 2.5 (*)    ALT 14 (*)    Total Bilirubin 1.3 (*)    GFR calc non Af Amer 5 (*)    GFR calc Af Amer 6 (*)    All other components within normal limits  CULTURE, BLOOD (ROUTINE X 2)  CULTURE, BLOOD (ROUTINE X 2)    EKG  EKG Interpretation None       Radiology Dg Chest 2 View  Result Date: 11/28/2015 CLINICAL DATA:  Aspiration pneumonia EXAM: CHEST  2 VIEW COMPARISON:  11/15/2015 FINDINGS: Progression of right  pleural effusion which appears loculated. Possible developing empyema. There appears to be fluid in the major fissure superiorly as well as in the lung base on the right. Improving right lower lobe infiltrate Progression of left lower lobe infiltrate since the prior study. Possible small left effusion. Negative for heart failure.  No acute skeletal abnormality. IMPRESSION: Loculated right pleural effusion has developed since the prior study and may represent an empyema given the recent history of pneumonia. Chest CT with contrast recommended for further evaluation. Progression of left lower lobe infiltrate which may represent persistent pneumonia. Small left effusion. Electronically Signed   By: Franchot Gallo M.D.   On: 11/28/2015 11:31   Ct Chest W Contrast  Result Date: 11/28/2015 CLINICAL DATA:  Shortness of breath with pneumonia and low grade fever. Abnormal chest radiograph. EXAM: CT CHEST WITH CONTRAST TECHNIQUE: Multidetector CT imaging of the chest was performed during intravenous contrast administration. CONTRAST:  34mL ISOVUE-300 IOPAMIDOL (ISOVUE-300) INJECTION 61% COMPARISON:  11/28/2015.  11/15/2015.  11/12/2015. FINDINGS: Cardiovascular: Aortic atherosclerosis. No aneurysm or dissection. Coronary artery atherosclerosis. Heart at the upper limits of normal in size. No pericardial fluid. No large pulmonary arterial filling defects. The study was not done in  the form of a pulmonary arterial CTA. Mediastinum/Nodes: No enlarged or low-density nodes. No mediastinal mass. Lungs/Pleura: Bilateral pleural effusions with some loculation. This raises concern for the possibility of empyema. There is pulmonary volume loss in the lower lobes left more than right. Upper lobes are clear. No underlying emphysema. Upper Abdomen: Negative Musculoskeletal: No significant finding. IMPRESSION: Loculated bilateral pleural effusions with associated compressive pulmonary atelectasis in the lower lungs left more than right. The loculated appearance raises the possibility of empyema. This can be seen in persons with chronic pleural scarring. Areas in the lower lobes showing volume loss could be secondary to simple compressive atelectasis or atelectatic pneumonia. Aortic atherosclerosis.  Coronary artery atherosclerosis. Electronically Signed   By: Nelson Chimes M.D.   On: 11/28/2015 17:48    Procedures Procedures (including critical care time)  Medications Ordered in ED Medications  vancomycin (VANCOCIN) 1,500 mg in sodium chloride 0.9 % 500 mL IVPB (1,500 mg Intravenous New Bag/Given 11/28/15 1700)  sodium chloride 0.9 % bolus 500 mL (0 mLs Intravenous Stopped 11/28/15 1701)  piperacillin-tazobactam (ZOSYN) IVPB 3.375 g (0 g Intravenous Stopped 11/28/15 1701)  iopamidol (ISOVUE-300) 61 % injection 75 mL (75 mLs Intravenous Contrast Given 11/28/15 1725)     Initial Impression / Assessment and Plan / ED Course  I have reviewed the triage vital signs and the nursing notes.  Pertinent labs & imaging results that were available during my care of the patient were reviewed by me and considered in my medical decision making (see chart for details).  Clinical Course  CRITICAL CARE Performed by: Nat Christen Total critical care time: 30 minutes Critical care time was exclusive of separately billable procedures and treating other patients. Critical care was necessary to treat or  prevent imminent or life-threatening deterioration. Critical care was time spent personally by me on the following activities: development of treatment plan with patient and/or surrogate as well as nursing, discussions with consultants, evaluation of patient's response to treatment, examination of patient, obtaining history from patient or surrogate, ordering and performing treatments and interventions, ordering and review of laboratory studies, ordering and review of radiographic studies, pulse oximetry and re-evaluation of patient's condition.  Patient is stable. CT chest reveals loculated bilateral pleural effusions with the possibility of empyema. IV vancomycin, IV Zosyn. Admit  to general medicine.  Final Clinical Impressions(s) / ED Diagnoses   Final diagnoses:  Bilateral pleural effusion    New Prescriptions New Prescriptions   No medications on file     Nat Christen, MD 11/28/15 1842

## 2015-11-28 NOTE — Progress Notes (Signed)
Pharmacy Antibiotic Note  Lance [name] is a 45 y.o. male admitted on 11/28/2015 with pneumonia.  Pharmacy has been consulted for Vancomycin and Zosyn dosing.  Pt has elevated SCr.    Plan:  Vancomycin 1500mg  IV x 1 in ED now, no additional doses needed today due to renal fxn Zosyn 3.375gm x 1 now over 30 min then 3.375gm IV q12h, (4hr infusion)  Monitor labs, renal fxn, progress and c/s Deescalate ABX when improved / appropriate.    Height: 6\' 3"  (190.5 cm) Weight: 222 lb (100.7 kg) IBW/kg (Calculated) : 84.5  Temp (24hrs), Avg:98.9 F (37.2 C), Min:98.1 F (36.7 C), Max:99.6 F (37.6 C)   Recent Labs Lab 11/28/15 1105  WBC 13.7*    Estimated Creatinine Clearance: 11.9 mL/min (by C-G formula based on SCr of 9.26 mg/dL (H)).    Allergies  Allergen Reactions  . Atorvastatin Other (See Comments)    Myalgias  . Minoxidil Other (See Comments)    Fluid retention   Antimicrobials this admission: Vancomycin 10/17 >>  Zosyn 10/17 >>   Dose adjustments this admission:  Microbiology results: (11/28/2015)  BCx: pending  UCx: pending   Sputum: pending   MRSA PCR: pending  Thank you for allowing pharmacy to be a part of this patient's care.  Hart Robinsons A 11/28/2015 4:06 PM

## 2015-11-28 NOTE — ED Triage Notes (Signed)
Pt states he was sent by Dr. Yehuda Mao office for admission to the hospital for a procedure. Per pt, finding of pulmonary pocket of infection on CXR.

## 2015-11-28 NOTE — H&P (Signed)
History and Physical    Lance [name] RDE:[id number] DOB: [birth year] DOA: 11/28/2015  PCP: Lance Hillier, MD   Patient coming from: Home, by way of PCP office  Chief Complaint: Fevers, cough, right mid-back pleuritic pain   HPI: Lance [name] is a 45 y.o. male with medical history significant for end-stage renal disease on hemodialysis, type 1 diabetes, and hypertension who presents to the emergency department at the direction of his primary care physician for evaluation of fevers, cough, and concerning chest x-ray findings. Patient was admitted to the hospital earlier this month and treated for pneumonia with growth of strep pneumo from the initial blood cultures. He defervesced, was stable on room air, had negative repeat blood cultures, and was discharged home to continue 4 weeks of vancomycin with dialysis. He reports feeling as though the pneumonia had completely resolved and until he developed subjective fevers with nausea and nonbloody vomiting on 11/25/2015. He has had intermittent fevers and persistent cough since that time. He also reports the new development of pain at the right lateral mid back with deep inspiration or cough. He was seen today by his primary care physician for these complaints and a follow-up chest x-ray had been obtained given the recent pneumonia. X-ray was concerning for loculation and a right-sided pleural effusion and the patient was directed to the ED for further evaluation. He endorses dyspnea with exertion, but denies significant symptoms while at rest. He denies chest pain or palpitations. He reports completing his hemodialysis on 11/27/2015 without incident.  ED Course: Upon arrival to the ED, patient is found to be afebrile, saturating adequately on room air, tachycardic low 100s, and with blood pressure of 86/45. Chemistry panel is notable for a sodium of 131, chloride 89, bicarbonate 41, and creatinine 10.49. Serum glucose is elevated to 322. CBC features a  leukocytosis to 12,900 and a stable normocytic anemia with hemoglobin of 10.8. Also noted on CBC is a thrombocytosis with platelets of 538,000. Chest x-ray features a loculated right pleural effusion with question of empyema as well as progression of left lower lobe pneumonia. This was followed with a chest CT which demonstrates bilateral loculated pleural effusions and associated compressive atelectasis with question of empyema. Patient was given a 500 mL normal saline bolus with resolution of his hypotension. Blood cultures were obtained and he was treated with empiric vancomycin and Zosyn. Patient has since remained hemodynamically stable and is in no acute respiratory distress. He will be admitted to the telemetry unit at [facility] for ongoing evaluation and management of loculated pleural effusions and concern for empyema.  Review of Systems:  All other systems reviewed and apart from HPI, are negative.  Past Medical History:  Diagnosis Date  . Aortic stenosis    Very mild in 05/2007  . CKD (chronic kidney disease) stage 3, GFR 30-59 ml/min   . Degenerative joint disease    Left TKA  . Diastolic heart failure (HCC)    LVEF 65-70% with grade 2 diastolic dysfunction  . Essential hypertension, benign 2000   LVH  . Hyperlipidemia 2000  . Pneumonia   . PSVT (paroxysmal supraventricular tachycardia) (HCC)    Nonsustained; asymptomatic; diagnosed by event recorder in 2006  . Tobacco abuse, in remission    20 pack years; discontinued in 1985; bronchitic changes on chest x-ray and 2009  . Type 1 diabetes mellitus ([place]) 1990    Past Surgical History:  Procedure Laterality Date  . AV FISTULA PLACEMENT Right 06/23/2014  Procedure: ARTERIOVENOUS (AV) FISTULA CREATION;  Surgeon: Angelia Mould, MD;  Location: [place];  Service: Vascular;  Laterality: Right;  . EYE SURGERY    . HERNIA REPAIR     As a child  . WISDOM TOOTH EXTRACTION       reports that he has never smoked. He  has never used smokeless tobacco. He reports that he does not drink alcohol or use drugs.  Allergies  Allergen Reactions  . Atorvastatin Other (See Comments)    Myalgias  . Minoxidil Other (See Comments)    Fluid retention    Family History  Problem Relation Age of Onset  . Diabetes Father   . Hypertension Father   . Diabetes Mother   . Hypertension Mother      Prior to Admission medications   Medication Sig Start Date End Date Taking? Authorizing Provider  amLODipine (NORVASC) 10 MG tablet TAKE 1 TABLET BY MOUTH EVERY DAY Patient taking differently: TAKE 1 TABLET BY MOUTH EVERY DAY IN THE EVENING 07/18/14  Yes Lance Kirschner, MD  carvedilol (COREG) 25 MG tablet TAKE 2 TABLETS BY MOUTH TWICE A DAY Patient taking differently: TAKE 1 TABLET BY MOUTH TWICE A DAY 01/24/14  Yes Satira Sark, MD  insulin aspart (NOVOLOG) 100 UNIT/ML injection Inject 10 Units into the skin 3 (three) times daily before meals. Patient taking differently: Inject 10-14 Units into the skin 3 (three) times daily before meals. Per sliding scale 12/29/13  Yes Lance Redwood, MD  ondansetron (ZOFRAN ODT) 4 MG disintegrating tablet Take 1 tablet (4 mg total) by mouth every 6 (six) hours as needed for nausea or vomiting. 11/28/15  Yes Lance Kirschner, MD  diclofenac sodium (VOLTAREN) 1 % GEL Apply 4 g topically 4 (four) times daily. Patient not taking: Reported on 11/28/2015 11/16/15   Lance Lose, MD    Physical Exam: Vitals:   11/28/15 1816 11/28/15 1830 11/28/15 1900 11/28/15 1930  BP: 146/85 143/87 142/85 139/77  Pulse: 99 97 100 103  Resp: 20 20 24 21   Temp:      TempSrc:      SpO2: 96% 94% 93% 92%  Weight:      Height:          Constitutional: NAD, calm, comfortable Eyes: PERTLA, lids and conjunctivae normal ENMT: Mucous membranes are moist. Posterior pharynx clear of any exudate or lesions.   Neck: normal, supple, no masses, no thyromegaly Respiratory: Diminished at the bases and  rhonchi at b/l mid-lung zones. Normal respiratory effort. No accessory muscle use.  Cardiovascular: Rate ~100 and regular with soft systolic murmur at LSB. No carotid bruits. No significant JVD. Abdomen: No distension, no tenderness, no masses palpated. Bowel sounds normal.  Musculoskeletal: no clubbing / cyanosis. No joint deformity upper and lower extremities. Normal muscle tone.  Skin: no significant rashes, lesions, ulcers. Warm, dry, well-perfused. Neurologic: CN 2-12 grossly intact. Sensation intact, DTR normal. Strength 5/5 in all 4 limbs.  Psychiatric: Normal judgment and insight. Alert and oriented x 3. Normal mood and affect.     Labs on Admission: I have personally reviewed following labs and imaging studies  CBC:  Recent Labs Lab 11/28/15 1105 11/28/15 1618  WBC 13.7* 12.9*  NEUTROABS 9.3* 8.6*  HGB 10.7* 10.8*  HCT 32.2* 32.0*  MCV 96.7 96.7  PLT 589* 998*   Basic Metabolic Panel:  Recent Labs Lab 11/28/15 1618  NA 131*  K 4.6  CL 89*  CO2 27  GLUCOSE 322*  BUN 41*  CREATININE 10.49*  CALCIUM 7.8*   GFR: Estimated Creatinine Clearance: 10.5 mL/min (by C-G formula based on SCr of 10.49 mg/dL (H)). Liver Function Tests:  Recent Labs Lab 11/28/15 1618  AST 15  ALT 14*  ALKPHOS 119  BILITOT 1.3*  PROT 7.9  ALBUMIN 2.5*   No results for input(s): LIPASE, AMYLASE in the last 168 hours. No results for input(s): AMMONIA in the last 168 hours. Coagulation Profile: No results for input(s): INR, PROTIME in the last 168 hours. Cardiac Enzymes: No results for input(s): CKTOTAL, CKMB, CKMBINDEX, TROPONINI in the last 168 hours. BNP (last 3 results) No results for input(s): PROBNP in the last 8760 hours. HbA1C: No results for input(s): HGBA1C in the last 72 hours. CBG: No results for input(s): GLUCAP in the last 168 hours. Lipid Profile: No results for input(s): CHOL, HDL, LDLCALC, TRIG, CHOLHDL, LDLDIRECT in the last 72 hours. Thyroid Function  Tests: No results for input(s): TSH, T4TOTAL, FREET4, T3FREE, THYROIDAB in the last 72 hours. Anemia Panel: No results for input(s): VITAMINB12, FOLATE, FERRITIN, TIBC, IRON, RETICCTPCT in the last 72 hours. Urine analysis:    Component Value Date/Time   COLORURINE YELLOW 05/05/2014 [place] 05/05/2014 0904   LABSPEC 1.020 05/05/2014 0904   PHURINE 6.5 05/05/2014 0904   GLUCOSEU 100 (A) 05/05/2014 0904   HGBUR LARGE (A) 05/05/2014 0904   BILIRUBINUR NEGATIVE 05/05/2014 0904   KETONESUR NEGATIVE 05/05/2014 0904   PROTEINUR >300 (A) 05/05/2014 0904   UROBILINOGEN 0.2 05/05/2014 0904   NITRITE NEGATIVE 05/05/2014 0904   LEUKOCYTESUR NEGATIVE 05/05/2014 0904   Sepsis Labs: @LABRCNTIP (procalcitonin:4,lacticidven:4) ) Recent Results (from the past 240 hour(s))  Culture, blood (single)     Status: None (Preliminary result)   Collection Time: 11/28/15 11:05 AM  Result Value Ref Range Status   Specimen Description BLOOD LEFT HAND  Final   Special Requests   Final    BOTTLES DRAWN AEROBIC AND ANAEROBIC AEB=7CC ANA=6CC   Culture PENDING  Incomplete   Report Status PENDING  Incomplete  Blood culture (routine x 2)     Status: None (Preliminary result)   Collection Time: 11/28/15  4:18 PM  Result Value Ref Range Status   Specimen Description BLOOD RIGHT FOREARM  Final   Special Requests   Final    BOTTLES DRAWN AEROBIC AND ANAEROBIC AEB=8CC ANA=6CC   Culture PENDING  Incomplete   Report Status PENDING  Incomplete  Blood culture (routine x 2)     Status: None (Preliminary result)   Collection Time: 11/28/15  4:24 PM  Result Value Ref Range Status   Specimen Description BLOOD LEFT HAND  Final   Special Requests   Final    BOTTLES DRAWN AEROBIC AND ANAEROBIC AEB=6CC ANA=5CC   Culture PENDING  Incomplete   Report Status PENDING  Incomplete     Radiological Exams on Admission: Dg Chest 2 View  Result Date: 11/28/2015 CLINICAL DATA:  Aspiration pneumonia EXAM: CHEST   2 VIEW COMPARISON:  11/15/2015 FINDINGS: Progression of right pleural effusion which appears loculated. Possible developing empyema. There appears to be fluid in the major fissure superiorly as well as in the lung base on the right. Improving right lower lobe infiltrate Progression of left lower lobe infiltrate since the prior study. Possible small left effusion. Negative for heart failure.  No acute skeletal abnormality. IMPRESSION: Loculated right pleural effusion has developed since the prior study and may represent an empyema given the recent history of pneumonia. Chest CT  with contrast recommended for further evaluation. Progression of left lower lobe infiltrate which may represent persistent pneumonia. Small left effusion. Electronically Signed   By: Franchot Gallo M.D.   On: 11/28/2015 11:31   Ct Chest W Contrast  Result Date: 11/28/2015 CLINICAL DATA:  Shortness of breath with pneumonia and low grade fever. Abnormal chest radiograph. EXAM: CT CHEST WITH CONTRAST TECHNIQUE: Multidetector CT imaging of the chest was performed during intravenous contrast administration. CONTRAST:  69mL ISOVUE-300 IOPAMIDOL (ISOVUE-300) INJECTION 61% COMPARISON:  11/28/2015.  11/15/2015.  11/12/2015. FINDINGS: Cardiovascular: Aortic atherosclerosis. No aneurysm or dissection. Coronary artery atherosclerosis. Heart at the upper limits of normal in size. No pericardial fluid. No large pulmonary arterial filling defects. The study was not done in the form of a pulmonary arterial CTA. Mediastinum/Nodes: No enlarged or low-density nodes. No mediastinal mass. Lungs/Pleura: Bilateral pleural effusions with some loculation. This raises concern for the possibility of empyema. There is pulmonary volume loss in the lower lobes left more than right. Upper lobes are clear. No underlying emphysema. Upper Abdomen: Negative Musculoskeletal: No significant finding. IMPRESSION: Loculated bilateral pleural effusions with associated  compressive pulmonary atelectasis in the lower lungs left more than right. The loculated appearance raises the possibility of empyema. This can be seen in persons with chronic pleural scarring. Areas in the lower lobes showing volume loss could be secondary to simple compressive atelectasis or atelectatic pneumonia. Aortic atherosclerosis.  Coronary artery atherosclerosis. Electronically Signed   By: Nelson Chimes M.D.   On: 11/28/2015 17:48    EKG: Ordered and pending.    Assessment/Plan  1. HCAP with loculated pleural effusions - Was admitted earlier this month and treated for PNA; initial blood cultures grew strep pneumo and repeat cultures remained negative; he has been continued on vancomycin with HD since that time  - Now presents with recurrent fevers, cough, and pleuritic pain - CXR with loculated effusion on Rt; CT chest with b/l loculated effusions and ?empyema  - There is a leukocytosis, but no fever yet here; lactate pending  - He was given a 500 cc NS bolus with resolution of hypotension  - Blood cultures were collected in ED, sputum culture and gram-stain requested; check urine for strep pneumo antigen   - Started on empiric vancomycin and Zosyn in the ED, will continue with vancomycin and cefepime while awaiting culture data  - Admit to [facility] as may need chest tube or even VATS if fails to improve with IV abx   2. Hypotension, hx of hypertension  - BP 86/45 on presentation, normalized after 500 cc NS bolus x1  - Treated with Coreg and Norvasc at home; these are held on admission  3. ESRD on HD - Reports completing HD on 10/16 without incident  - Given a 500 cc NS bolus in ED for hypotension  - No urgent need for HD at time of admission  - Maintenance HD requested for 10/18   4. Type I DM - A1c 9.7% in 2015; serum glucose is 322 on admission - Managed with Novolog 10-14 units TID per sliding-scale at home - Check CBG with meals and qHS  - Start with 10 units Lantus  qHS, Novolog 4 units TID with meals, and a low-intensity sliding-scale correctional insulin - Update A1c, pending  5. Hyponatremia  - Serum sodium 131 on admission in setting of apparent dehydration  - A 500 cc NS bolus was given in ED  - Repeat chem panel in am   6. Normocytic anemia -  Hgb is 10.8 on admission and stable with no s/s of bleeding - Likely secondary to ESRD    DVT prophylaxis: sq heparin  Code Status: Full  Family Communication: Multiple family members updated at bedside at patient's request Disposition Plan: Admit to telemetry at [place] called: None Admission status: Inpatient    Vianne Bulls, MD [facility] Pager [phone]  If 7PM-7AM, please contact night-coverage [email or web address] Password [email or web address]  11/28/2015, 8:08 PM

## 2015-11-28 NOTE — Progress Notes (Signed)
   Subjective:    Patient ID: Lance [name], male    DOB: 1970/05/18, 45 y.o.   MRN: [id number]  Emesis   This is a new problem. Episode onset: vomiting on saturday and sunday morning. none since. feels weak, pain in lower back on right side when he coughs.  low grade fever today.   Just got out of [place] hospital about 2 weeks ago for pneumonia.   Patient has multiple risk factors for protracted illness including type 1 diabetes and chronic dialysis.  Also palpated by positive blood culture for strep pneumonia.  Was advised by infectious disease to maintain IV vancomycin during dialysis for the month. According to the patient this was stopped after 4 doses. Next  Has a fairly difficult weekend with progressive nausea. Low-grade fever chills aching sharp chest pain right posterior  Entire hospital record is reviewed in presence of patient. Along with results of tests    Review of Systems  Gastrointestinal: Positive for vomiting.   Positive cough. Generally nonproductive diminished energy otherwise see above    Objective:   Physical Exam  Alert moderate malaise hydration adequate HEENT normal lungs basilar crackles right greater than left also diminished breath sounds right base no wheezes no baseline to kidney heart rare rhythm ankles without edema  Due to concerning presentation sent for urgent blood work and x-ray    Assessment & Plan:  Impression status post hospitalization for pneumonia with reemergence of pneumonia symptoms along with x-ray suggestive of potential empyema. Also white blood count elevated now. All of this discussed with patient. I called emergency room. Need to get back towards the hospital. Will require further workup potential chest tube etc. discussed with patient. 35-40 minutes spent with the patient most in discussion assessing post hospitalization issues. Reconfirming and finding chronic issues. Diagnosing new issues. Discussing game plan approach  with patient and emergency room

## 2015-11-29 ENCOUNTER — Encounter (HOSPITAL_COMMUNITY): Payer: Self-pay | Admitting: Physician Assistant

## 2015-11-29 DIAGNOSIS — J90 Pleural effusion, not elsewhere classified: Secondary | ICD-10-CM

## 2015-11-29 DIAGNOSIS — N186 End stage renal disease: Secondary | ICD-10-CM

## 2015-11-29 LAB — CBC
HCT: 30.7 % — ABNORMAL LOW (ref 39.0–52.0)
HEMOGLOBIN: 10.2 g/dL — AB (ref 13.0–17.0)
MCH: 31 pg (ref 26.0–34.0)
MCHC: 33.2 g/dL (ref 30.0–36.0)
MCV: 93.3 fL (ref 78.0–100.0)
Platelets: 556 10*3/uL — ABNORMAL HIGH (ref 150–400)
RBC: 3.29 MIL/uL — AB (ref 4.22–5.81)
RDW: 15.3 % (ref 11.5–15.5)
WBC: 11.7 10*3/uL — ABNORMAL HIGH (ref 4.0–10.5)

## 2015-11-29 LAB — RENAL FUNCTION PANEL
ANION GAP: 14 (ref 5–15)
Albumin: 2.2 g/dL — ABNORMAL LOW (ref 3.5–5.0)
BUN: 44 mg/dL — ABNORMAL HIGH (ref 6–20)
CHLORIDE: 91 mmol/L — AB (ref 101–111)
CO2: 26 mmol/L (ref 22–32)
Calcium: 8.5 mg/dL — ABNORMAL LOW (ref 8.9–10.3)
Creatinine, Ser: 12.16 mg/dL — ABNORMAL HIGH (ref 0.61–1.24)
GFR calc non Af Amer: 4 mL/min — ABNORMAL LOW (ref >60)
GFR, EST AFRICAN AMERICAN: 5 mL/min — AB (ref >60)
Glucose, Bld: 239 mg/dL — ABNORMAL HIGH (ref 65–99)
Phosphorus: 6.3 mg/dL — ABNORMAL HIGH (ref 2.5–4.6)
Potassium: 5 mmol/L (ref 3.5–5.1)
Sodium: 131 mmol/L — ABNORMAL LOW (ref 135–145)

## 2015-11-29 LAB — STREP PNEUMONIAE URINARY ANTIGEN: STREP PNEUMO URINARY ANTIGEN: POSITIVE — AB

## 2015-11-29 LAB — GLUCOSE, CAPILLARY
Glucose-Capillary: 253 mg/dL — ABNORMAL HIGH (ref 65–99)
Glucose-Capillary: 241 mg/dL — ABNORMAL HIGH (ref 65–99)
Glucose-Capillary: 99 mg/dL (ref 65–99)
Glucose-Capillary: 252 mg/dL — ABNORMAL HIGH (ref 65–99)

## 2015-11-29 MED ORDER — SODIUM CHLORIDE 0.9 % IV SOLN: 100.0000 mL | INTRAVENOUS | Status: DC | PRN

## 2015-11-29 MED ORDER — VANCOMYCIN HCL IN DEXTROSE 1-5 GM/200ML-% IV SOLN
1000.0000 mg | INTRAVENOUS | Status: DC
  Filled 2015-11-29: qty 200

## 2015-11-29 MED ORDER — CALCITRIOL 0.25 MCG PO CAPS
ORAL_CAPSULE | ORAL | Status: AC
  Filled 2015-11-29: qty 5

## 2015-11-29 MED ORDER — CALCITRIOL 0.5 MCG PO CAPS
1.2500 ug | ORAL_CAPSULE | ORAL | Status: DC
  Administered 2015-11-29 – 2015-12-04 (×4): 1.25 ug via ORAL
  Filled 2015-11-29: qty 1
  Filled 2015-11-29: qty 2

## 2015-11-29 MED ORDER — DEXTROSE 5 % IV SOLN
1.0000 g | INTRAVENOUS | Status: DC
  Administered 2015-11-29 – 2015-12-03 (×5): 1 g via INTRAVENOUS
  Filled 2015-11-29 (×7): qty 1

## 2015-11-29 MED ORDER — HEPARIN SODIUM (PORCINE) 5000 UNIT/ML IJ SOLN
5000.0000 [IU] | Freq: Three times a day (TID) | INTRAMUSCULAR | Status: DC
  Administered 2015-11-30 – 2015-12-04 (×11): 5000 [IU] via SUBCUTANEOUS
  Filled 2015-11-29 (×11): qty 1

## 2015-11-29 MED ORDER — VANCOMYCIN HCL IN DEXTROSE 1-5 GM/200ML-% IV SOLN
1000.0000 mg | INTRAVENOUS | Status: DC
  Administered 2015-12-01 – 2015-12-04 (×2): 1000 mg via INTRAVENOUS
  Filled 2015-11-29 (×2): qty 200

## 2015-11-29 MED ORDER — LIDOCAINE HCL (PF) 1 % IJ SOLN: 5.0000 mL | INTRAMUSCULAR | Status: DC | PRN

## 2015-11-29 MED ORDER — PENTAFLUOROPROP-TETRAFLUOROETH EX AERO: 1.0000 "application " | INHALATION_SPRAY | CUTANEOUS | Status: DC | PRN

## 2015-11-29 NOTE — Consult Note (Addendum)
[address]       [place] [address]             [phone]        Lance [name] [facility] Medical Record #[id number] Date of Birth: 1970-12-21  Referring:  Dr Cruzita Lederer Primary Care: Mickie Hillier, MD  Chief Complaint:    Chief Complaint  Patient presents with  . Pulmonary infection    sent by Dr. Yehuda Mao office for admission for procedure    History of Present Illness:      Lance [name] is a 45 yo AA male with known history of ESRD on dialysis, Type 1 DM, and Hypertension.  He developed complaints of fevers, cough and went to his PCP for evaluation.  CXR was obtained and was concerning for pneumonia and he was sent to the ED for further care.  Earlier this month the patient was hospitalized with pneumonia.  He was found to be bacteremic with strep pneumoniae.  He improved, repeat cultures were negative and he was discharged home with a 4 week course of Vancomycin to be given in dialysis.  The patient states he thought he was doing well and his pneumonia had resolved.  However, he noticed he developed fevers, nausea, and vomiting on 11/25/2015.  Since that time he has been experiencing fevers intermittently and a persistent cough.  He also complains of pain in his right mid back with deep inspiration and has been having difficulty laying flat at night.  Workup in the ED showed a mild leukocytosis, the patient was afebrile and CT scan showed bilateral loculated pleural effusions concerning for empyema.  He was admitted via the medicine service and started on empiric Vanc and Zosyn.  TCTS consult was obtained for possible VATs procedure.  Currently the patient febrile with low temp of 99.  He has no specific complaints, but does states he has difficulty breathing when laying flat.   Current Activity/ Functional Status: Patient is independent with mobility/ambulation, transfers, ADL's, IADL's.   Zubrod Score: At the time of surgery this patient's most appropriate  activity status/level should be described as: []     0    Normal activity, no symptoms []     1    Restricted in physical strenuous activity but ambulatory, able to do out light work [x]     2    Ambulatory and capable of self care, unable to do work activities, up and about                 more than 50%  Of the time                            []     3    Only limited self care, in bed greater than 50% of waking hours []     4    Completely disabled, no self care, confined to bed or chair []     5    Moribund  Past Medical History:  Diagnosis Date  . Aortic stenosis    Very mild in 05/2007  . Arthritis    "right shoulder; right knee; feel like I've got it all over" (11/28/2015)  . Degenerative joint disease    Left TKA  . Diastolic heart failure (HCC)    LVEF 65-70% with grade 2 diastolic dysfunction  . ESRD (end stage renal disease) on dialysis ([place])    "MWF; [facility] on [address]" (11/28/2015)  .  Essential hypertension, benign 2000   LVH  . GERD (gastroesophageal reflux disease)   . HCAP (healthcare-associated pneumonia) 11/28/2015  . Hyperlipidemia 2000  . Loculated pleural effusion 11/28/2015   [name] 11/28/2015  . Pneumonia 12/2013; 05/2014  . PSVT (paroxysmal supraventricular tachycardia) (HCC)    Nonsustained; asymptomatic; diagnosed by event recorder in 2006  . Tobacco abuse, in remission    20 pack years; discontinued in 1985; bronchitic changes on chest x-ray and 2009  . Type 1 diabetes mellitus ([place]) 1990    Past Surgical History:  Procedure Laterality Date  . AV FISTULA PLACEMENT Right 06/23/2014   Procedure: ARTERIOVENOUS (AV) FISTULA CREATION;  Surgeon: Angelia Mould, MD;  Location: [facility];  Service: Vascular;  Laterality: Right;  . EYE SURGERY Bilateral    "for glaucoma"  . INGUINAL HERNIA REPAIR Right    As a child  . WISDOM TOOTH EXTRACTION  1987    History  Smoking Status  . Never Smoker  Smokeless Tobacco  . Never Used    History  Alcohol Use  .  Yes    Comment: 11/28/2015 "used to drink; stopped ~ 2 yr ago"    Social History   Social History  . Marital status: Married    Spouse name: N/A  . Number of children: N/A  . Years of education: N/A   Occupational History  . Not on file.   Social History Main Topics  . Smoking status: Never Smoker  . Smokeless tobacco: Never Used  . Alcohol use Yes     Comment: 11/28/2015 "used to drink; stopped ~ 2 yr ago"  . Drug use: No  . Sexual activity: Yes   Other Topics Concern  . Not on file   Social History Narrative  . No narrative on file    Allergies  Allergen Reactions  . Atorvastatin Other (See Comments)    Myalgias  . Minoxidil Other (See Comments)    Fluid retention    Current Facility-Administered Medications  Medication Dose Route Frequency Provider Last Rate Last Dose  . 0.9 %  sodium chloride infusion  250 mL Intravenous PRN Vianne Bulls, MD      . acetaminophen (TYLENOL) tablet 650 mg  650 mg Oral Q6H PRN Ilene Qua Opyd, MD      . ceFEPIme (MAXIPIME) 1 g in dextrose 5 % 50 mL IVPB  1 g Intravenous Q24H Franky Macho, RPH      . guaiFENesin (MUCINEX) 12 hr tablet 600 mg  600 mg Oral BID Gardiner Barefoot, NP   600 mg at 11/29/15 0049  . heparin injection 5,000 Units  5,000 Units Subcutaneous Q8H Vianne Bulls, MD   5,000 Units at 11/29/15 0608  . HYDROcodone-acetaminophen (NORCO/VICODIN) 5-325 MG per tablet 1-2 tablet  1-2 tablet Oral Q4H PRN Ilene Qua Opyd, MD      . insulin aspart (novoLOG) injection 0-5 Units  0-5 Units Subcutaneous QHS Vianne Bulls, MD   4 Units at 11/28/15 2331  . insulin aspart (novoLOG) injection 0-9 Units  0-9 Units Subcutaneous TID WC Vianne Bulls, MD   5 Units at 11/29/15 0813  . insulin aspart (novoLOG) injection 4 Units  4 Units Subcutaneous TID WC Vianne Bulls, MD   4 Units at 11/29/15 0813  . insulin glargine (LANTUS) injection 10 Units  10 Units Subcutaneous QHS Vianne Bulls, MD   10 Units at 11/28/15 2331  . sodium  chloride flush (NS) 0.9 % injection 3 mL  3 mL Intravenous Q12H Vianne Bulls, MD   3 mL at 11/28/15 2333  . sodium chloride flush (NS) 0.9 % injection 3 mL  3 mL Intravenous PRN Vianne Bulls, MD      . Derrill Memo ON 12/01/2015] vancomycin (VANCOCIN) IVPB 1000 mg/200 mL premix  1,000 mg Intravenous Q M,W,F-HD Costin Karlyne Greenspan, MD        Prescriptions Prior to Admission  Medication Sig Dispense Refill Last Dose  . amLODipine (NORVASC) 10 MG tablet TAKE 1 TABLET BY MOUTH EVERY DAY (Patient taking differently: TAKE 1 TABLET BY MOUTH EVERY DAY IN THE EVENING) 15 tablet 0 11/27/2015 at Unknown time  . carvedilol (COREG) 25 MG tablet TAKE 2 TABLETS BY MOUTH TWICE A DAY (Patient taking differently: TAKE 1 TABLET BY MOUTH TWICE A DAY) 120 tablet 3 11/28/2015 at 815a  . insulin aspart (NOVOLOG) 100 UNIT/ML injection Inject 10 Units into the skin 3 (three) times daily before meals. (Patient taking differently: Inject 10-14 Units into the skin 3 (three) times daily before meals. Per sliding scale) 10 mL 11 11/28/2015 at Unknown time  . ondansetron (ZOFRAN ODT) 4 MG disintegrating tablet Take 1 tablet (4 mg total) by mouth every 6 (six) hours as needed for nausea or vomiting. 20 tablet 0 UNKNOWN  . diclofenac sodium (VOLTAREN) 1 % GEL Apply 4 g topically 4 (four) times daily. (Patient not taking: Reported on 11/28/2015) 100 g 0 Not Taking    Family History  Problem Relation Age of Onset  . Diabetes Father   . Hypertension Father   . Diabetes Mother   . Hypertension Mother    Review of Systems:  Constitutional: positive for fevers Respiratory: positive for pleurisy/chest pain and short of breath when lying flat Cardiovascular: positive for chest pressure/discomfort and dyspnea Gastrointestinal: negative Neurological: negative     Cardiac Review of Systems: Y or N  Chest Pain [ n   ]  Resting SOB [ n  ] Exertional SOB  Blue.Reese  ]  Orthopnea Blue.Reese  ]   Pedal Edema [   ]    Palpitations [  ] Syncope  [  ]     Presyncope [   ]  General Review of Systems: [Y] = yes [  ]=no Constitional: recent weight change [  ]; anorexia [  ]; fatigue [  ]; nausea [  ]; night sweats [  ]; fever Blue.Reese  ]; or chills [  ]                                                               Dental: poor dentition[  ]; Last Dentist visit:   Eye : blurred vision [  ]; diplopia [   ]; vision changes [  ];  Amaurosis fugax[  ]; Resp: cough [ y ];  wheezing[  ];  hemoptysis[n  ]; shortness of breath[  ]; paroxysmal nocturnal dyspnea[ y ]; dyspnea on exertion[  ]; or orthopnea[  ];  GI:  gallstones[  ], vomiting[  ];  dysphagia[  ]; melena[  ];  hematochezia [  ]; heartburn[  ];   Hx of  Colonoscopy[  ]; GU: kidney stones [  ]; hematuria[  ];   dysuria [  ];  nocturia[  ];  history of     obstruction [  ]; urinary frequency [  ]             Skin: rash, swelling[  ];, hair loss[  ];  peripheral edema[  ];  or itching[  ]; Musculosketetal: myalgias[  ];  joint swelling[  ];  joint erythema[  ];  joint pain[  ];  back pain[  ];  Heme/Lymph: bruising[  ];  bleeding[  ];  anemia[  ];  Neuro: TIA[  ];  headaches[  ];  stroke[  ];  vertigo[  ];  seizures[  ];   paresthesias[  ];  difficulty walking[  ];  Psych:depression[  ]; anxiety[  ];  Endocrine: diabetes[y  ];  thyroid dysfunction[  ];  Immunizations: Flu [  ]; Pneumococcal[  ];  Other:  Physical Exam: BP (!) 162/77 (BP Location: Left Arm)   Pulse (!) 102   Temp 99.1 F (37.3 C) (Oral)   Resp 17   Ht 6\' 3"  (1.905 m)   Wt 222 lb 10.6 oz (101 kg)   SpO2 92%   BMI 27.83 kg/m   General appearance: alert, cooperative and no distress Head: Normocephalic, without obvious abnormality, atraumatic Resp: diminished breath sounds bibasilar and rhonchi bilaterally Cardio: regular rate and rhythm GI: soft, non-tender; bowel sounds normal; no masses,  no organomegaly Extremities: extremities normal, atraumatic, no cyanosis or edema Neurologic: Grossly normal Right upper arm fistula for  dialysis, no evidence of infection   Diagnostic Studies & Laboratory data:     Recent Radiology Findings:   Dg Chest 2 View  Result Date: 11/28/2015 CLINICAL DATA:  Aspiration pneumonia EXAM: CHEST  2 VIEW COMPARISON:  11/15/2015 FINDINGS: Progression of right pleural effusion which appears loculated. Possible developing empyema. There appears to be fluid in the major fissure superiorly as well as in the lung base on the right. Improving right lower lobe infiltrate Progression of left lower lobe infiltrate since the prior study. Possible small left effusion. Negative for heart failure.  No acute skeletal abnormality. IMPRESSION: Loculated right pleural effusion has developed since the prior study and may represent an empyema given the recent history of pneumonia. Chest CT with contrast recommended for further evaluation. Progression of left lower lobe infiltrate which may represent persistent pneumonia. Small left effusion. Electronically Signed   By: Franchot Gallo M.D.   On: 11/28/2015 11:31   Ct Chest W Contrast  Result Date: 11/28/2015 CLINICAL DATA:  Shortness of breath with pneumonia and low grade fever. Abnormal chest radiograph. EXAM: CT CHEST WITH CONTRAST TECHNIQUE: Multidetector CT imaging of the chest was performed during intravenous contrast administration. CONTRAST:  61mL ISOVUE-300 IOPAMIDOL (ISOVUE-300) INJECTION 61% COMPARISON:  11/28/2015.  11/15/2015.  11/12/2015. FINDINGS: Cardiovascular: Aortic atherosclerosis. No aneurysm or dissection. Coronary artery atherosclerosis. Heart at the upper limits of normal in size. No pericardial fluid. No large pulmonary arterial filling defects. The study was not done in the form of a pulmonary arterial CTA. Mediastinum/Nodes: No enlarged or low-density nodes. No mediastinal mass. Lungs/Pleura: Bilateral pleural effusions with some loculation. This raises concern for the possibility of empyema. There is pulmonary volume loss in the lower lobes left  more than right. Upper lobes are clear. No underlying emphysema. Upper Abdomen: Negative Musculoskeletal: No significant finding. IMPRESSION: Loculated bilateral pleural effusions with associated compressive pulmonary atelectasis in the lower lungs left more than right. The loculated appearance raises the possibility of empyema. This can be seen in persons with chronic pleural scarring. Areas in  the lower lobes showing volume loss could be secondary to simple compressive atelectasis or atelectatic pneumonia. Aortic atherosclerosis.  Coronary artery atherosclerosis. Electronically Signed   By: Nelson Chimes M.D.   On: 11/28/2015 17:48    I have independently reviewed the above radiology studies  and reviewed the findings with the patient.    Recent Lab Findings: Lab Results  Component Value Date   WBC 12.9 (H) 11/28/2015   HGB 10.8 (L) 11/28/2015   HCT 32.0 (L) 11/28/2015   PLT 538 (H) 11/28/2015   GLUCOSE 322 (H) 11/28/2015   CHOL 174 12/28/2013   TRIG 229 (H) 12/28/2013   HDL 39 (L) 12/28/2013   LDLCALC 89 12/28/2013   ALT 14 (L) 11/28/2015   AST 15 11/28/2015   NA 131 (L) 11/28/2015   K 4.6 11/28/2015   CL 89 (L) 11/28/2015   CREATININE 10.49 (H) 11/28/2015   BUN 41 (H) 11/28/2015   CO2 27 11/28/2015   TSH 1.760 12/26/2013   HGBA1C 9.7 (H) 12/26/2013      Assessment / Plan:      1. Small bilateral possibly  loculated pleural effusions-  with recent diagnosis of pneumonia  2. Plan- no surgical intervention needed at this time.  We would recommended IR CT Guided placement of pigtail drainage catheter to both left and right side... Orders have been placed, requested fluid culture if present  Discussed with Dr Cruzita Lederer  I  spent 30 minutes counseling the patient face to face and 50% or more the  time was spent in counseling and coordination of care. The total time spent in the appointment was 55 minutes.  Grace Isaac MD      [address] [place]  [address] Office [phone]   [place]

## 2015-11-29 NOTE — Procedures (Signed)
  I was present at this dialysis session, have reviewed the session itself and made  appropriate changes [name] MD [place] pager [phone]   11/29/2015, 4:38 PM

## 2015-11-29 NOTE — Progress Notes (Signed)
PROGRESS NOTE  Lance [name] LPF:[id number] DOB: [birth year] DOA: 11/28/2015 PCP: Mickie Hillier, MD   LOS: 1 day   Brief Narrative: 45 y.o. Male with a history of ESRD on hemodialysis, type 1 diabetes, and HTN who presented to the ED under instruction of primary care physician for low-grade fever, nonproductive cough, dyspnea, and abnormal chest XR findings. Patient was admitted to [facility] in the first week of October for pneumonia. Growth showed strep pneumo from blood cultures. He was discharged on vanomycin with dialysis for 4 weeks, although the patient reports only receiving 4 treatments. Chest x-ray on 11/15/15 showed loculated right pleural effusion concerning for empyema. He felt fine after discharge for about 1 week. He then developed nausea, vomiting, dyspnea, and a temp of 99. He also developed right lateral mid-back pain. Patient denies chest pain and palpitations.   Assessment & Plan: Principal Problem:   Loculated pleural effusion Active Problems:   Diabetes mellitus type 1 (HCC)   Essential hypertension, benign   Chronic diastolic heart failure (HCC)   Hyponatremia   ESRD (end stage renal disease) on dialysis (HCC)   Bilateral pleural effusion   HCAP (healthcare-associated pneumonia)    HCAP (healthcare-associated pneumonia) / Loculated pleural effusion  - Chest x-ray on 11/15/15 showed loculated right pleural effusion concerning for empyema. Cardiothoracic surgery consulted, appreciate input. Did not recommend VATS - discussed with Dr. Baxter Flattery from ID, once source control is achieved recommends 4 weeks of IV Vanomcycin with HD  Diabetes mellitus type 1  - A1c 9.7% in 2015; serum glucose is 322 on admission.  - continue Lantus / SSI. Suspect poorly controlled CBGs in the setting of active infection  Essential Hypertension - home BP meds on hold since admission - monitor BP post HD if remains elevated will resume   Hyponatremia  - HD today   ESRD (end stage  renal disease) on dialysis [facility])  - consulted nephrology, communicated with Dr. Jonnie Finner  Normocytic anemia - in the setting of ESRD   DVT prophylaxis: heparin  Code Status: Full Family Communication: Wife present at beside.  Disposition Plan: TBD  Consultants:   Cardiothoracic surgery   ID (Phone)  Nephrology   Procedures:   None   Antimicrobials:  Cefepime 10/17 >>  Vancomycin 10/17 >>  Subjective: Patient reports having low-grade fever, nausea, vomiting, dyspnea for the past few days. Does not complain of these symptoms today. Today he complains of mild right lateral mid-back pain that is worse with inspiration. Currently denies chest pain, palpitations and dyspnea.   Objective: Vitals:   11/28/15 2030 11/28/15 2100 11/28/15 2217 11/29/15 0432  BP: 151/83 148/84 (!) 163/79 (!) 162/77  Pulse: 105 105 (!) 103 (!) 102  Resp: 18 19 18 17   Temp:   99.6 F (37.6 C) 99.1 F (37.3 C)  TempSrc:   Oral Oral  SpO2: 94% 92% 98% 92%  Weight:   101 kg (222 lb 10.6 oz)   Height:   6\' 3"  (1.905 m)     Intake/Output Summary (Last 24 hours) at 11/29/15 1138 Last data filed at 11/29/15 0800  Gross per 24 hour  Intake              790 ml  Output                0 ml  Net              790 ml   Filed Weights   11/28/15 1516 11/28/15 2217  Weight: 100.7 kg (222 lb) 101 kg (222 lb 10.6 oz)    Examination: Constitutional: NAD Vitals:   11/28/15 2030 11/28/15 2100 11/28/15 2217 11/29/15 0432  BP: 151/83 148/84 (!) 163/79 (!) 162/77  Pulse: 105 105 (!) 103 (!) 102  Resp: 18 19 18 17   Temp:   99.6 F (37.6 C) 99.1 F (37.3 C)  TempSrc:   Oral Oral  SpO2: 94% 92% 98% 92%  Weight:   101 kg (222 lb 10.6 oz)   Height:   6\' 3"  (1.905 m)    Eyes:  lids and conjunctivae normal ENMT: Mucous membranes are moist.  Respiratory: Decreased breath sounds as bases bilaterally, left more decreased than right. Clear to auscultation bilaterally, no wheezing, no crackles. Normal  respiratory effort. No accessory muscle use.  Cardiovascular: Tachycardic at 102. Regular rhythm, no murmurs / rubs / gallops. No LE edema.  Abdomen: no tenderness.  Skin: no rashes, lesions, ulcers. No induration. LE skin dry.   Data Reviewed: I have personally reviewed following labs and imaging studies  CBC:  Recent Labs Lab 11/28/15 1105 11/28/15 1618  WBC 13.7* 12.9*  NEUTROABS 9.3* 8.6*  HGB 10.7* 10.8*  HCT 32.2* 32.0*  MCV 96.7 96.7  PLT 589* 970*   Basic Metabolic Panel:  Recent Labs Lab 11/28/15 1618  NA 131*  K 4.6  CL 89*  CO2 27  GLUCOSE 322*  BUN 41*  CREATININE 10.49*  CALCIUM 7.8*   GFR: Estimated Creatinine Clearance: 10.5 mL/min (by C-G formula based on SCr of 10.49 mg/dL (H)). Liver Function Tests:  Recent Labs Lab 11/28/15 1618  AST 15  ALT 14*  ALKPHOS 119  BILITOT 1.3*  PROT 7.9  ALBUMIN 2.5*   CBG:  Recent Labs Lab 11/28/15 2228 11/29/15 0611 11/29/15 1114  GLUCAP 340* 253* 241*   Urine analysis:    Component Value Date/Time   COLORURINE YELLOW 05/05/2014 0904   APPEARANCEUR CLEAR 05/05/2014 0904   LABSPEC 1.020 05/05/2014 0904   PHURINE 6.5 05/05/2014 0904   GLUCOSEU 100 (A) 05/05/2014 0904   HGBUR LARGE (A) 05/05/2014 0904   BILIRUBINUR NEGATIVE 05/05/2014 0904   KETONESUR NEGATIVE 05/05/2014 0904   PROTEINUR >300 (A) 05/05/2014 0904   UROBILINOGEN 0.2 05/05/2014 0904   NITRITE NEGATIVE 05/05/2014 0904   LEUKOCYTESUR NEGATIVE 05/05/2014 0904   Sepsis Labs: Invalid input(s): PROCALCITONIN, LACTICIDVEN  Recent Results (from the past 240 hour(s))  Culture, blood (single)     Status: None (Preliminary result)   Collection Time: 11/28/15 11:05 AM  Result Value Ref Range Status   Specimen Description BLOOD LEFT HAND  Final   Special Requests   Final    BOTTLES DRAWN AEROBIC AND ANAEROBIC AEB=7CC ANA=6CC   Culture NO GROWTH < 24 HOURS  Final   Report Status PENDING  Incomplete  Blood culture (routine x 2)      Status: None (Preliminary result)   Collection Time: 11/28/15  4:18 PM  Result Value Ref Range Status   Specimen Description BLOOD RIGHT FOREARM  Final   Special Requests   Final    BOTTLES DRAWN AEROBIC AND ANAEROBIC AEB=8CC ANA=6CC   Culture NO GROWTH < 24 HOURS  Final   Report Status PENDING  Incomplete  Blood culture (routine x 2)     Status: None (Preliminary result)   Collection Time: 11/28/15  4:24 PM  Result Value Ref Range Status   Specimen Description BLOOD LEFT HAND  Final   Special Requests   Final  BOTTLES DRAWN AEROBIC AND ANAEROBIC AEB=6CC ANA=5CC   Culture NO GROWTH < 24 HOURS  Final   Report Status PENDING  Incomplete      Radiology Studies: Dg Chest 2 View  Result Date: 11/28/2015 CLINICAL DATA:  Aspiration pneumonia EXAM: CHEST  2 VIEW COMPARISON:  11/15/2015 FINDINGS: Progression of right pleural effusion which appears loculated. Possible developing empyema. There appears to be fluid in the major fissure superiorly as well as in the lung base on the right. Improving right lower lobe infiltrate Progression of left lower lobe infiltrate since the prior study. Possible small left effusion. Negative for heart failure.  No acute skeletal abnormality. IMPRESSION: Loculated right pleural effusion has developed since the prior study and may represent an empyema given the recent history of pneumonia. Chest CT with contrast recommended for further evaluation. Progression of left lower lobe infiltrate which may represent persistent pneumonia. Small left effusion. Electronically Signed   By: Franchot Gallo M.D.   On: 11/28/2015 11:31   Ct Chest W Contrast  Result Date: 11/28/2015 CLINICAL DATA:  Shortness of breath with pneumonia and low grade fever. Abnormal chest radiograph. EXAM: CT CHEST WITH CONTRAST TECHNIQUE: Multidetector CT imaging of the chest was performed during intravenous contrast administration. CONTRAST:  30mL ISOVUE-300 IOPAMIDOL (ISOVUE-300) INJECTION 61%  COMPARISON:  11/28/2015.  11/15/2015.  11/12/2015. FINDINGS: Cardiovascular: Aortic atherosclerosis. No aneurysm or dissection. Coronary artery atherosclerosis. Heart at the upper limits of normal in size. No pericardial fluid. No large pulmonary arterial filling defects. The study was not done in the form of a pulmonary arterial CTA. Mediastinum/Nodes: No enlarged or low-density nodes. No mediastinal mass. Lungs/Pleura: Bilateral pleural effusions with some loculation. This raises concern for the possibility of empyema. There is pulmonary volume loss in the lower lobes left more than right. Upper lobes are clear. No underlying emphysema. Upper Abdomen: Negative Musculoskeletal: No significant finding. IMPRESSION: Loculated bilateral pleural effusions with associated compressive pulmonary atelectasis in the lower lungs left more than right. The loculated appearance raises the possibility of empyema. This can be seen in persons with chronic pleural scarring. Areas in the lower lobes showing volume loss could be secondary to simple compressive atelectasis or atelectatic pneumonia. Aortic atherosclerosis.  Coronary artery atherosclerosis. Electronically Signed   By: Nelson Chimes M.D.   On: 11/28/2015 17:48     Scheduled Meds: . ceFEPime (MAXIPIME) IV  1 g Intravenous Q24H  . guaiFENesin  600 mg Oral BID  . heparin  5,000 Units Subcutaneous Q8H  . insulin aspart  0-5 Units Subcutaneous QHS  . insulin aspart  0-9 Units Subcutaneous TID WC  . insulin aspart  4 Units Subcutaneous TID WC  . insulin glargine  10 Units Subcutaneous QHS  . sodium chloride flush  3 mL Intravenous Q12H  . [START ON 12/01/2015] vancomycin  1,000 mg Intravenous Q M,W,F-HD   Continuous Infusions:     Marzetta Board, MD, PhD [facility] Pager [phone]  If 7PM-7AM, please contact night-coverage [email or web address] Password [email or web address] 11/29/2015, 11:38 AM

## 2015-11-29 NOTE — H&P (Signed)
Chief Complaint: Loculated pleural effusions  Referring Physician(s): Ellwood Handler PA with [facility]  Supervising Physician: Jacqulynn Cadet  Patient Status: [facility] - In-pt  History of Present Illness: Lance [name] is a 45 y.o. male with medical history significant for end-stage renal disease on hemodialysis, type 1 diabetes, and hypertension who presented to the emergency department yesterday at the direction of his primary care physician for evaluation of fevers, cough, and concerning chest x-ray findings.  He was admitted to the hospital earlier this month and treated for pneumonia with growth of strep pneumo from the initial blood cultures.   He defervesced, was stable on room air, had negative repeat blood cultures, and was discharged home to continue 4 weeks of vancomycin with dialysis.   He reports feeling as though the pneumonia had completely resolved and until he developed subjective fevers with nausea and nonbloody vomiting on 11/25/2015.   He has had intermittent fevers and persistent cough since that time.   He also reports the new development of pain at the right lateral mid back with deep inspiration or cough.   X-ray was concerning for loculation and a right-sided pleural effusion  He c/o dyspnea with exertion, but denies significant symptoms while at rest.   He denies chest pain or palpitations.   Last hemodialysis on 11/27/2015 without incident.  We are asked to place bilateral pigtail chest catheters for management of the effusions.  He is not currently NPO so this will need be done tomorrow. He is currently stable  Past Medical History:  Diagnosis Date  . Aortic stenosis    Very mild in 05/2007  . Arthritis    "right shoulder; right knee; feel like I've got it all over" (11/28/2015)  . Degenerative joint disease    Left TKA  . Diastolic heart failure (HCC)    LVEF 65-70% with grade 2 diastolic dysfunction  . ESRD (end stage renal disease) on  dialysis ([place])    "MWF; [facility] on [address]" (11/28/2015)  . Essential hypertension, benign 2000   LVH  . GERD (gastroesophageal reflux disease)   . HCAP (healthcare-associated pneumonia) 11/28/2015  . Hyperlipidemia 2000  . Loculated pleural effusion 11/28/2015   [name] 11/28/2015  . Pneumonia 12/2013; 05/2014  . PSVT (paroxysmal supraventricular tachycardia) (HCC)    Nonsustained; asymptomatic; diagnosed by event recorder in 2006  . Tobacco abuse, in remission    20 pack years; discontinued in 1985; bronchitic changes on chest x-ray and 2009  . Type 1 diabetes mellitus ([place]) 1990    Past Surgical History:  Procedure Laterality Date  . AV FISTULA PLACEMENT Right 06/23/2014   Procedure: ARTERIOVENOUS (AV) FISTULA CREATION;  Surgeon: Angelia Mould, MD;  Location: [place];  Service: Vascular;  Laterality: Right;  . EYE SURGERY Bilateral    "for glaucoma"  . INGUINAL HERNIA REPAIR Right    As a child  . WISDOM TOOTH EXTRACTION  1987    Allergies: Atorvastatin and Minoxidil  Medications: Prior to Admission medications   Medication Sig Start Date End Date Taking? Authorizing Provider  amLODipine (NORVASC) 10 MG tablet TAKE 1 TABLET BY MOUTH EVERY DAY Patient taking differently: TAKE 1 TABLET BY MOUTH EVERY DAY IN THE EVENING 07/18/14  Yes [name], MD  carvedilol (COREG) 25 MG tablet TAKE 2 TABLETS BY MOUTH TWICE A DAY Patient taking differently: TAKE 1 TABLET BY MOUTH TWICE A DAY 01/24/14  Yes Satira Sark, MD  insulin aspart (NOVOLOG) 100 UNIT/ML injection Inject 10 Units into  the skin 3 (three) times daily before meals. Patient taking differently: Inject 10-14 Units into the skin 3 (three) times daily before meals. Per sliding scale 12/29/13  Yes Delfina Redwood, MD  ondansetron (ZOFRAN ODT) 4 MG disintegrating tablet Take 1 tablet (4 mg total) by mouth every 6 (six) hours as needed for nausea or vomiting. 11/28/15  Yes [name], MD  diclofenac sodium  (VOLTAREN) 1 % GEL Apply 4 g topically 4 (four) times daily. Patient not taking: Reported on 11/28/2015 11/16/15   Thurnell Lose, MD     Family History  Problem Relation Age of Onset  . Diabetes Father   . Hypertension Father   . Diabetes Mother   . Hypertension Mother     Social History   Social History  . Marital status: Married    Spouse name: N/A  . Number of children: N/A  . Years of education: N/A   Social History Main Topics  . Smoking status: Never Smoker  . Smokeless tobacco: Never Used  . Alcohol use Yes     Comment: 11/28/2015 "used to drink; stopped ~ 2 yr ago"  . Drug use: No  . Sexual activity: Yes   Other Topics Concern  . None   Social History Narrative  . None    Review of Systems: A 12 point ROS discussed  Review of Systems  Constitutional: Positive for activity change, fatigue and fever. Negative for appetite change and chills.  HENT: Negative.   Respiratory: Positive for cough and shortness of breath. Negative for wheezing.   Cardiovascular: Negative for chest pain.  Gastrointestinal: Negative.   Genitourinary: Negative.   Musculoskeletal: Positive for back pain.  Skin: Negative.   Neurological: Negative.   Hematological: Negative.   Psychiatric/Behavioral: Negative.     Vital Signs: BP (!) 158/81 (BP Location: Left Arm)   Pulse (!) 101   Temp 99.2 F (37.3 C) (Oral)   Resp 20   Ht 6\' 3"  (1.905 m)   Wt 222 lb 10.6 oz (101 kg)   SpO2 94%   BMI 27.83 kg/m   Physical Exam  Constitutional: He is oriented to person, place, and time. He appears well-developed and well-nourished.  HENT:  Head: Normocephalic and atraumatic.  Eyes: EOM are normal.  Neck: Normal range of motion.  Cardiovascular: Normal rate, regular rhythm and normal heart sounds.   Pulmonary/Chest: Effort normal.  Breath sounds diminished at bases  Abdominal: Soft. He exhibits no distension. There is no tenderness.  Musculoskeletal: Normal range of motion.    Neurological: He is alert and oriented to person, place, and time.  Skin: Skin is warm and dry.  Psychiatric: He has a normal mood and affect. His behavior is normal. Judgment and thought content normal.  Vitals reviewed.   Mallampati Score:  MD Evaluation Airway: WNL Heart: WNL Abdomen: WNL Chest/ Lungs: Other (comments) Chest/ lungs comments: Diminished breath sounds bilaterally at bases. ASA  Classification: 3 Mallampati/Airway Score: Two  Imaging: Dg Chest 2 View  Result Date: 11/28/2015 CLINICAL DATA:  Aspiration pneumonia EXAM: CHEST  2 VIEW COMPARISON:  11/15/2015 FINDINGS: Progression of right pleural effusion which appears loculated. Possible developing empyema. There appears to be fluid in the major fissure superiorly as well as in the lung base on the right. Improving right lower lobe infiltrate Progression of left lower lobe infiltrate since the prior study. Possible small left effusion. Negative for heart failure.  No acute skeletal abnormality. IMPRESSION: Loculated right pleural effusion has developed since  the prior study and may represent an empyema given the recent history of pneumonia. Chest CT with contrast recommended for further evaluation. Progression of left lower lobe infiltrate which may represent persistent pneumonia. Small left effusion. Electronically Signed   By: Franchot Gallo M.D.   On: 11/28/2015 11:31   Dg Chest 2 View  Result Date: 11/15/2015 CLINICAL DATA:  Nausea and vomiting. History of end-stage renal disease on dialysis. EXAM: CHEST  2 VIEW COMPARISON:  11/14/2015 FINDINGS: Enlargement of the cardiac silhouette is unchanged. The lungs remain hypoinflated with pulmonary vascular congestion and patchy bibasilar opacities which have increased from the prior study. Small bilateral pleural effusions are present. No pneumothorax is seen. No acute osseous abnormality is identified. IMPRESSION: Cardiomegaly with pulmonary vascular congestion and increased  bibasilar opacities which may reflect edema or pneumonia. Small pleural effusions. Electronically Signed   By: Logan Bores M.D.   On: 11/15/2015 20:32   Dg Chest 2 View  Result Date: 11/14/2015 CLINICAL DATA:  Three days of fever. Onset of vomiting today. History of aortic stenosis, CHF, diabetes, former smoker EXAM: CHEST  2 VIEW COMPARISON:  Chest x-ray of November 12, 2015 FINDINGS: The lungs are slightly less well inflated today. The interstitial markings remain mildly prominent. There is no alveolar infiltrate. There is no significant pleural effusion though traces of fluid may be blunting the posterior costophrenic angles. The cardiac silhouette is enlarged. The central pulmonary vascularity is engorged. The trachea is midline. The bony thorax exhibits no acute abnormality. IMPRESSION: Atelectasis or developing pneumonia in the left lower lobe. Superimposed mild CHF. Trace bilateral pleural effusions. Electronically Signed   By: David  Martinique M.D.   On: 11/14/2015 14:43   Dg Chest 2 View  Result Date: 11/12/2015 CLINICAL DATA:  Right rib pain starting yesterday, now moved to the right arm and neck. No injury EXAM: CHEST  2 VIEW COMPARISON:  05/05/2014 FINDINGS: The heart size and mediastinal contours are within normal limits. Both lungs are clear. The visualized skeletal structures are unremarkable. IMPRESSION: No active cardiopulmonary disease. Electronically Signed   By: Lucienne Capers M.D.   On: 11/12/2015 03:52   Dg Shoulder Right  Result Date: 11/12/2015 CLINICAL DATA:  Rib pain starting yesterday, now moving to the right arm and neck. No injury. EXAM: RIGHT SHOULDER - 2+ VIEW COMPARISON:  None. FINDINGS: Degenerative changes in the acromioclavicular and glenohumeral joints with joint space narrowing and osteophyte formation. Subacromial spur formation is present. Decreased subacromial space may indicate chronic rotator cuff arthropathy. No evidence of acute fracture or dislocation of the  right shoulder. Coracoclavicular spaces are normal. IMPRESSION: Degenerative changes in the right shoulder. Subacromial spurring with possible rotator cuff arthropathy. No acute bony abnormalities. Electronically Signed   By: Lucienne Capers M.D.   On: 11/12/2015 03:50   Ct Chest W Contrast  Result Date: 11/28/2015 CLINICAL DATA:  Shortness of breath with pneumonia and low grade fever. Abnormal chest radiograph. EXAM: CT CHEST WITH CONTRAST TECHNIQUE: Multidetector CT imaging of the chest was performed during intravenous contrast administration. CONTRAST:  26mL ISOVUE-300 IOPAMIDOL (ISOVUE-300) INJECTION 61% COMPARISON:  11/28/2015.  11/15/2015.  11/12/2015. FINDINGS: Cardiovascular: Aortic atherosclerosis. No aneurysm or dissection. Coronary artery atherosclerosis. Heart at the upper limits of normal in size. No pericardial fluid. No large pulmonary arterial filling defects. The study was not done in the form of a pulmonary arterial CTA. Mediastinum/Nodes: No enlarged or low-density nodes. No mediastinal mass. Lungs/Pleura: Bilateral pleural effusions with some loculation. This raises concern for the  possibility of empyema. There is pulmonary volume loss in the lower lobes left more than right. Upper lobes are clear. No underlying emphysema. Upper Abdomen: Negative Musculoskeletal: No significant finding. IMPRESSION: Loculated bilateral pleural effusions with associated compressive pulmonary atelectasis in the lower lungs left more than right. The loculated appearance raises the possibility of empyema. This can be seen in persons with chronic pleural scarring. Areas in the lower lobes showing volume loss could be secondary to simple compressive atelectasis or atelectatic pneumonia. Aortic atherosclerosis.  Coronary artery atherosclerosis. Electronically Signed   By: Nelson Chimes M.D.   On: 11/28/2015 17:48   Mr Cervical Spine Wo Contrast  Result Date: 11/13/2015 CLINICAL DATA:  45 year old diabetic  hypertensive male with neck pain extending to right shoulder 2 weeks. Anemia. No known injury. EXAM: MRI CERVICAL SPINE WITHOUT CONTRAST TECHNIQUE: Multiplanar, multisequence MR imaging of the cervical spine was performed. No intravenous contrast was administered. COMPARISON:  None. FINDINGS: Alignment: Normal. Vertebrae: Decreased signal intensity of bone marrow may be related to habitus or patient's underlying anemia. No focal osseous abnormality. 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 Posterior Fossa, vertebral arteries, paraspinal tissues: Slightly prominent size left level 2 lymph nodes measuring up to 1.2 cm short axis dimension of questionable significance/ etiology. Disc levels: C2-3:  Negative. C3-4:  Negative. C4-5: Minimal right uncinate hypertrophy with minimal right foraminal narrowing. C5-6:  Negative. C6-7: Mild bulge/shallow protrusion right paracentral position with slight indentation right ventral thecal sac approaching but not compressing the right ventral nerve root. C7-T1:  Mild facet degenerative changes. IMPRESSION: C6-7 mild bulge/shallow protrusion right paracentral position with slight indentation right ventral thecal sac approaching but not compressing the right ventral nerve root. C4-5 minimal right foraminal narrowing. 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 Decreased signal intense of bone marrow may be related to patient's habitus or underlying anemia. Slightly prominent size left level 2 lymph nodes measuring up to 1.2 cm short axis dimension of questionable significance or etiology. Electronically Signed   By: Genia Del M.D.   On: 11/13/2015 10:05   Mr Shoulder Right Wo Contrast  Result Date: 11/13/2015 CLINICAL DATA:  Neck pain radiating to the right shoulder. Pain for 2 weeks. EXAM: MRI OF THE RIGHT SHOULDER WITHOUT CONTRAST TECHNIQUE: Multiplanar, multisequence MR imaging of the shoulder  was performed. No intravenous contrast was administered. COMPARISON:  None. FINDINGS: Rotator cuff: Complete tear of the supraspinatus tendon with 3.5 cm of retraction Infraspinatus tendon is intact. Teres minor tendon is intact. Subscapularis tendon is intact. Muscles: No atrophy or fatty replacement of nor abnormal signal within, the muscles of the rotator cuff. Biceps long head: Mild tendinosis of the intraarticular portion of the long head of the biceps tendon. Acromioclavicular Joint: Mild arthropathy of the acromioclavicular joint. Type I acromion. No subacromial/subdeltoid bursal fluid. Glenohumeral Joint: No joint effusion. No chondral defect. Labrum: Grossly intact, but evaluation is limited by lack of intraarticular fluid. Bones: Subcortical reactive marrow changes at the infraspinatus insertion. No other marrow abnormality, fracture or dislocation. Other: None. IMPRESSION: 1. Complete tear of the supraspinatus tendon with 3.5 cm of retraction 2. Mild tendinosis of the intraarticular portion of the long head of the biceps tendon. Electronically Signed   By: Kathreen Devoid   On: 11/13/2015 09:57   Dg Abd 2 Views  Result Date: 11/15/2015 CLINICAL DATA:  Nausea and vomiting EXAM: ABDOMEN - 2 VIEW COMPARISON:  CT 07/31/2015 FINDINGS: No dilated loops of large or small bowel. No pathologic calcifications. Vascular calcification noted in the pelvis. IMPRESSION: No acute abdominal findings. Electronically Signed   By: Suzy Bouchard M.D.   On: 11/15/2015 20:30    Labs:  CBC:  Recent Labs  11/16/15 0608 11/28/15 1105 11/28/15 1618 11/29/15 1258  WBC 11.6* 13.7* 12.9* 11.7*  HGB 10.6* 10.7* 10.8* 10.2*  HCT 31.5* 32.2* 32.0* 30.7*  PLT 252 589* 538* 556*    COAGS: No results for input(s): INR, APTT in the last 8760 hours.  BMP:  Recent Labs  11/15/15 0745 11/16/15 0608 11/28/15 1618 11/29/15 1258  NA 126* 129* 131* 131*  K 5.3* 5.2* 4.6 5.0  CL 84* 89* 89* 91*  CO2 27 28 27 26     GLUCOSE 403* 375* 322* 239*  BUN 79* 45* 41* 44*  CALCIUM 8.1* 8.8* 7.8* 8.5*  CREATININE 13.34* 9.26* 10.49* 12.16*  GFRNONAA 4* 6* 5* 4*  GFRAA 4* 7* 6* 5*    LIVER FUNCTION TESTS:  Recent Labs  11/12/15 0305  11/14/15 0556 11/15/15 0745 11/28/15 1618 11/29/15 1258  BILITOT 0.9  --   --   --  1.3*  --   AST 20  --   --   --  15  --   ALT 16*  --   --   --  14*  --   ALKPHOS 64  --   --   --  119  --   PROT 8.0  --   --   --  7.9  --   ALBUMIN 3.3*  < > 2.5* 2.4* 2.5* 2.2*  < > = values in this interval not displayed.  TUMOR MARKERS: No results for input(s): AFPTM, CEA, CA199, CHROMGRNA in the last 8760 hours.  Assessment and Plan:  Bilateral loculated pleural effusions.  Will proceed with bilateral pigtail chest catheter placement tomorrow.  Risks and Benefits discussed with the patient including bleeding, infection, damage to adjacent structures, pneumothorax, and sepsis.  All of the patient's questions were answered, patient is agreeable to proceed. Consent signed and in chart.  Thank you for this interesting consult.  I greatly enjoyed meeting Lance [name] and look forward to participating in their care.  A copy of this report was sent to the requesting provider on this date.  Electronically Signed: Murrell Redden PA-C 11/29/2015, 2:25 PM   I spent a total of 40 Minutes in face to face in clinical consultation, greater than 50% of which was counseling/coordinating care for CT guided chest catheter.

## 2015-11-29 NOTE — Consult Note (Signed)
[facility] Renal Consultation Note    Indication for Consultation:  Management of ESRD/hemodialysis, anemia, hypertension/volume, and secondary hyperparathyroidism. PCP:  HPI: Lance [name] is a 45 y.o. male with ESRD, HTN, Type 1 DM who was admitted for ?recurrent pneumonia v. empyema. S/p AP admit earlier this month with N/V, found to have pneumonia. Was supposed to be discharged on Vancomycin x 1 mo, looks like he was given IV Vanc with HD through 10/13. Initially, felt ok after discharge, but then was feeling worse for the past few days with recurrent vomiting, cough, dyspnea. Saw his PCP on 10/17 and CXR showed pleural effusion v. empyema. He was sent to the [facility] ED for evaluation.  In the ED, was hypotensive which improved with fluid bolus. Has had low grade fever (~99.52F). Labs fine except WBC of 12.9. Chest CT confirmed loculated B pleural effusions with compressive atelectasis (L>R). He was started on Vanc/Zosyn, now changed to Vanc/Cefepime. Strep pneumo urinary antigen is positive. BCx 10/17 pending. Currently, dyspnea ok. Denies CP, abdominal pain, or diarrhea.   From renal standpoint, on HD MWF. Due for HD today. Has had issues with hypotension and vomiting while on HD recently.  Past Medical History:  Diagnosis Date  . Aortic stenosis    Very mild in 05/2007  . Arthritis    "right shoulder; right knee; feel like I've got it all over" (11/28/2015)  . Degenerative joint disease    Left TKA  . Diastolic heart failure (HCC)    LVEF 65-70% with grade 2 diastolic dysfunction  . ESRD (end stage renal disease) on dialysis ([place])    "MWF; [facility] on [address]" (11/28/2015)  . Essential hypertension, benign 2000   LVH  . GERD (gastroesophageal reflux disease)   . HCAP (healthcare-associated pneumonia) 11/28/2015  . Hyperlipidemia 2000  . Loculated pleural effusion 11/28/2015   [name] 11/28/2015  . Pneumonia 12/2013; 05/2014  . PSVT (paroxysmal supraventricular  tachycardia) (HCC)    Nonsustained; asymptomatic; diagnosed by event recorder in 2006  . Tobacco abuse, in remission    20 pack years; discontinued in 1985; bronchitic changes on chest x-ray and 2009  . Type 1 diabetes mellitus ([place]) 1990   Past Surgical History:  Procedure Laterality Date  . AV FISTULA PLACEMENT Right 06/23/2014   Procedure: ARTERIOVENOUS (AV) FISTULA CREATION;  Surgeon: Angelia Mould, MD;  Location: [facility];  Service: Vascular;  Laterality: Right;  . EYE SURGERY Bilateral    "for glaucoma"  . INGUINAL HERNIA REPAIR Right    As a child  . WISDOM TOOTH EXTRACTION  1987   Family History  Problem Relation Age of Onset  . Diabetes Father   . Hypertension Father   . Diabetes Mother   . Hypertension Mother    Social History:  reports that he has never smoked. He has never used smokeless tobacco. He reports that he drinks alcohol. He reports that he does not use drugs.  ROS: As per HPI otherwise negative.  Physical Exam: Vitals:   11/28/15 2030 11/28/15 2100 11/28/15 2217 11/29/15 0432  BP: 151/83 148/84 (!) 163/79 (!) 162/77  Pulse: 105 105 (!) 103 (!) 102  Resp: 18 19 18 17   Temp:   99.6 F (37.6 C) 99.1 F (37.3 C)  TempSrc:   Oral Oral  SpO2: 94% 92% 98% 92%  Weight:   101 kg (222 lb 10.6 oz)   Height:   6\' 3"  (1.905 m)      General: Well developed, well nourished, in  no acute distress. Head: Normocephalic, atraumatic, sclera non-icteric, mucus membranes are moist. Neck: Supple without lymphadenopathy/masses. JVD not elevated. Lungs: Clear bilaterally in upper lobes, faint crackles in B bases. Heart: RRR with normal S1, S2. No murmurs, rubs, or gallops appreciated. Abdomen: Soft, non-tender, non-distended with normoactive bowel sounds. No rebound/guarding.  Musculoskeletal:  Strength and tone appear normal for age. Lower extremities: Trace LE edema. Neuro: Alert and oriented X 3. Moves all extremities spontaneously. Psych:  Responds to questions  appropriately with a normal affect. Dialysis Access: RUE AVF + thrill/bruit  Allergies  Allergen Reactions  . Atorvastatin Other (See Comments)    Myalgias  . Minoxidil Other (See Comments)    Fluid retention   Prior to Admission medications   Medication Sig Start Date End Date Taking? Authorizing Provider  amLODipine (NORVASC) 10 MG tablet TAKE 1 TABLET BY MOUTH EVERY DAY Patient taking differently: TAKE 1 TABLET BY MOUTH EVERY DAY IN THE EVENING 07/18/14  Yes [name], MD  carvedilol (COREG) 25 MG tablet TAKE 2 TABLETS BY MOUTH TWICE A DAY Patient taking differently: TAKE 1 TABLET BY MOUTH TWICE A DAY 01/24/14  Yes Satira Sark, MD  insulin aspart (NOVOLOG) 100 UNIT/ML injection Inject 10 Units into the skin 3 (three) times daily before meals. Patient taking differently: Inject 10-14 Units into the skin 3 (three) times daily before meals. Per sliding scale 12/29/13  Yes Delfina Redwood, MD  ondansetron (ZOFRAN ODT) 4 MG disintegrating tablet Take 1 tablet (4 mg total) by mouth every 6 (six) hours as needed for nausea or vomiting. 11/28/15  Yes [name], MD  diclofenac sodium (VOLTAREN) 1 % GEL Apply 4 g topically 4 (four) times daily. Patient not taking: Reported on 11/28/2015 11/16/15   Thurnell Lose, MD   Current Facility-Administered Medications  Medication Dose Route Frequency Provider Last Rate Last Dose  . 0.9 %  sodium chloride infusion  250 mL Intravenous PRN Vianne Bulls, MD      . acetaminophen (TYLENOL) tablet 650 mg  650 mg Oral Q6H PRN Ilene Qua Opyd, MD      . ceFEPIme (MAXIPIME) 1 g in dextrose 5 % 50 mL IVPB  1 g Intravenous Q24H Franky Macho, RPH      . guaiFENesin (MUCINEX) 12 hr tablet 600 mg  600 mg Oral BID Gardiner Barefoot, NP   600 mg at 11/29/15 0049  . [START ON 11/30/2015] heparin injection 5,000 Units  5,000 Units Subcutaneous Q8H Ardis Rowan, PA-C      . HYDROcodone-acetaminophen (NORCO/VICODIN) 5-325 MG per tablet 1-2  tablet  1-2 tablet Oral Q4H PRN Ilene Qua Opyd, MD      . insulin aspart (novoLOG) injection 0-5 Units  0-5 Units Subcutaneous QHS Vianne Bulls, MD   4 Units at 11/28/15 2331  . insulin aspart (novoLOG) injection 0-9 Units  0-9 Units Subcutaneous TID WC Vianne Bulls, MD   5 Units at 11/29/15 0813  . insulin aspart (novoLOG) injection 4 Units  4 Units Subcutaneous TID WC Vianne Bulls, MD   4 Units at 11/29/15 0813  . insulin glargine (LANTUS) injection 10 Units  10 Units Subcutaneous QHS Vianne Bulls, MD   10 Units at 11/28/15 2331  . sodium chloride flush (NS) 0.9 % injection 3 mL  3 mL Intravenous Q12H Vianne Bulls, MD   3 mL at 11/28/15 2333  . sodium chloride flush (NS) 0.9 % injection 3 mL  3  mL Intravenous PRN Vianne Bulls, MD      . Derrill Memo ON 12/01/2015] vancomycin (VANCOCIN) IVPB 1000 mg/200 mL premix  1,000 mg Intravenous Q M,W,F-HD Caren Griffins, MD       Labs: Basic Metabolic Panel:  Recent Labs Lab 11/28/15 1618  NA 131*  K 4.6  CL 89*  CO2 27  GLUCOSE 322*  BUN 41*  CREATININE 10.49*  CALCIUM 7.8*   Liver Function Tests:  Recent Labs Lab 11/28/15 1618  AST 15  ALT 14*  ALKPHOS 119  BILITOT 1.3*  PROT 7.9  ALBUMIN 2.5*   CBC:  Recent Labs Lab 11/28/15 1105 11/28/15 1618  WBC 13.7* 12.9*  NEUTROABS 9.3* 8.6*  HGB 10.7* 10.8*  HCT 32.2* 32.0*  MCV 96.7 96.7  PLT 589* 538*   CBG:  Recent Labs Lab 11/28/15 2228 11/29/15 0611 11/29/15 1114  GLUCAP 340* 253* 241*   Studies/Results: Dg Chest 2 View  Result Date: 11/28/2015 CLINICAL DATA:  Aspiration pneumonia EXAM: CHEST  2 VIEW COMPARISON:  11/15/2015 FINDINGS: Progression of right pleural effusion which appears loculated. Possible developing empyema. There appears to be fluid in the major fissure superiorly as well as in the lung base on the right. Improving right lower lobe infiltrate Progression of left lower lobe infiltrate since the prior study. Possible small left effusion.  Negative for heart failure.  No acute skeletal abnormality. IMPRESSION: Loculated right pleural effusion has developed since the prior study and may represent an empyema given the recent history of pneumonia. Chest CT with contrast recommended for further evaluation. Progression of left lower lobe infiltrate which may represent persistent pneumonia. Small left effusion. Electronically Signed   By: Franchot Gallo M.D.   On: 11/28/2015 11:31   Ct Chest W Contrast  Result Date: 11/28/2015 CLINICAL DATA:  Shortness of breath with pneumonia and low grade fever. Abnormal chest radiograph. EXAM: CT CHEST WITH CONTRAST TECHNIQUE: Multidetector CT imaging of the chest was performed during intravenous contrast administration. CONTRAST:  60mL ISOVUE-300 IOPAMIDOL (ISOVUE-300) INJECTION 61% COMPARISON:  11/28/2015.  11/15/2015.  11/12/2015. FINDINGS: Cardiovascular: Aortic atherosclerosis. No aneurysm or dissection. Coronary artery atherosclerosis. Heart at the upper limits of normal in size. No pericardial fluid. No large pulmonary arterial filling defects. The study was not done in the form of a pulmonary arterial CTA. Mediastinum/Nodes: No enlarged or low-density nodes. No mediastinal mass. Lungs/Pleura: Bilateral pleural effusions with some loculation. This raises concern for the possibility of empyema. There is pulmonary volume loss in the lower lobes left more than right. Upper lobes are clear. No underlying emphysema. Upper Abdomen: Negative Musculoskeletal: No significant finding. IMPRESSION: Loculated bilateral pleural effusions with associated compressive pulmonary atelectasis in the lower lungs left more than right. The loculated appearance raises the possibility of empyema. This can be seen in persons with chronic pleural scarring. Areas in the lower lobes showing volume loss could be secondary to simple compressive atelectasis or atelectatic pneumonia. Aortic atherosclerosis.  Coronary artery atherosclerosis.  Electronically Signed   By: Nelson Chimes M.D.   On: 11/28/2015 17:48    Dialysis Orders:  MWF at [facility] 4h   100kg   2/2 bath  AVF RUA   Hep 5000 - Calcitriol 1.49mcg PO q HD - No venofer yet, but tsat 12% on 10/11 - No ESA currently.  Assessment/Plan: 1.  Pneumonia v. Empyema: Restarted on Vanc/Cefepime. Sputum Cx and BCx 10/17 pending. + Urine Strep antigen. Plan per primary. 2.  ESRD: Continue MWF schedule for now.  No heparin with HD in case needs procedure (thoracentesis, etc). 3.  Hypertension/volume: HyPOtensive on admit, improved with IV fluids. BP meds on hold for now. Will try to remove some fluid with HD. 4.  Anemia: Hgb 10.8. No ESA for now, likely resume soon. No IV iron for now since active infection. 5.  Metabolic bone disease: Ca 7.8. Continue binders/VDRA. Will check Phos. 6.  Nutrition:  Alb 2.5; start Nepro supplements. 7. Type I DM: Per primary.  Veneta Penton, PA-C 11/29/2015, 12:05 PM  [facility] Pager: [phone]  Pt seen, examined and agree w A/P as above. ESRD pt s/p recent PNA w pneumococcal bacteremia, rx'd with abx and dc'd on 10.3.17.  Returns now with recurrent N/V, cough , SOB.  CXR done by PCP showed pleural effusion vs empyema on the R.  Admitted here.  CT showed loculated bilat pleural effusions, started on IV abx.  Asked to see for HD.  On HD now.  [name] MD [employer] pager [phone]   11/29/2015, 4:34 PM

## 2015-11-29 NOTE — Progress Notes (Signed)
Pharmacy Antibiotic Note  Lance [name] is a 45 y.o. male admitted on 11/28/2015 with pneumonia.  Pharmacy has been consulted for Vancomycin and Cefepime dosing.  Pt with ESRD - HD on M/W/F as o/p.  Vancomycin 1.5gm and Zosyn 3.375gm IV given in ED at AP ~1700. Also received Cefepime 1gm ~0000.  Plan:  Vancomycin 1gm IV post HD Cefepime 1gm IV q24h Will f/u HD schedule and tolerance and micro data Vanc pre-HD level prn  Height: 6\' 3"  (190.5 cm) Weight: 222 lb 10.6 oz (101 kg) IBW/kg (Calculated) : 84.5  Temp (24hrs), Avg:99.1 F (37.3 C), Min:98.1 F (36.7 C), Max:99.6 F (37.6 C)   Recent Labs Lab 11/28/15 1105 11/28/15 1618  WBC 13.7* 12.9*  CREATININE  --  10.49*  LATICACIDVEN  --  1.1    Estimated Creatinine Clearance: 10.5 mL/min (by C-G formula based on SCr of 10.49 mg/dL (H)).    Allergies  Allergen Reactions  . Atorvastatin Other (See Comments)    Myalgias  . Minoxidil Other (See Comments)    Fluid retention   Antimicrobials this admission: Zosyn 10/17 x1 Vancomycin 10/17 >>  Cefepime 10/18 >>  Dose adjustments this admission:  Microbiology results: (11/29/2015) 10/17 BCx x2:  Sputum: pending   Thank you for allowing pharmacy to be a part of this patient's care.  Sherlon Handing, PharmD, BCPS Clinical pharmacist, pager [phone] 11/29/2015 3:16 AM

## 2015-11-30 ENCOUNTER — Inpatient Hospital Stay (HOSPITAL_COMMUNITY): Payer: Medicare Other

## 2015-11-30 LAB — GLUCOSE, CAPILLARY
GLUCOSE-CAPILLARY: 269 mg/dL — AB (ref 65–99)
GLUCOSE-CAPILLARY: 334 mg/dL — AB (ref 65–99)
GLUCOSE-CAPILLARY: 241 mg/dL — AB (ref 65–99)
Glucose-Capillary: 398 mg/dL — ABNORMAL HIGH (ref 65–99)

## 2015-11-30 LAB — HEMOGLOBIN A1C
Hgb A1c MFr Bld: 9.8 % — ABNORMAL HIGH (ref 4.8–5.6)
Mean Plasma Glucose: 235 mg/dL

## 2015-11-30 LAB — GRAM STAIN

## 2015-11-30 LAB — CBC
HEMATOCRIT: 30.4 % — AB (ref 39.0–52.0)
HEMOGLOBIN: 10.3 g/dL — AB (ref 13.0–17.0)
MCH: 31.6 pg (ref 26.0–34.0)
MCHC: 33.9 g/dL (ref 30.0–36.0)
MCV: 93.3 fL (ref 78.0–100.0)
Platelets: 535 10*3/uL — ABNORMAL HIGH (ref 150–400)
RBC: 3.26 MIL/uL — ABNORMAL LOW (ref 4.22–5.81)
RDW: 15.2 % (ref 11.5–15.5)
WBC: 11 10*3/uL — ABNORMAL HIGH (ref 4.0–10.5)

## 2015-11-30 LAB — BASIC METABOLIC PANEL
ANION GAP: 13 (ref 5–15)
BUN: 26 mg/dL — ABNORMAL HIGH (ref 6–20)
CO2: 27 mmol/L (ref 22–32)
Calcium: 8.2 mg/dL — ABNORMAL LOW (ref 8.9–10.3)
Chloride: 91 mmol/L — ABNORMAL LOW (ref 101–111)
Creatinine, Ser: 8.98 mg/dL — ABNORMAL HIGH (ref 0.61–1.24)
GFR calc Af Amer: 7 mL/min — ABNORMAL LOW (ref >60)
GFR, EST NON AFRICAN AMERICAN: 6 mL/min — AB (ref >60)
GLUCOSE: 381 mg/dL — AB (ref 65–99)
POTASSIUM: 4.7 mmol/L (ref 3.5–5.1)
Sodium: 131 mmol/L — ABNORMAL LOW (ref 135–145)

## 2015-11-30 LAB — PROTIME-INR
INR: 1.16
Prothrombin Time: 14.8 seconds (ref 11.4–15.2)

## 2015-11-30 LAB — APTT: APTT: 35 s (ref 24–36)

## 2015-11-30 LAB — VANCOMYCIN, RANDOM: Vancomycin Rm: 27

## 2015-11-30 LAB — HIV ANTIBODY (ROUTINE TESTING W REFLEX): HIV Screen 4th Generation wRfx: NONREACTIVE

## 2015-11-30 MED ORDER — MIDAZOLAM HCL 2 MG/2ML IJ SOLN
INTRAMUSCULAR | Status: AC
Start: 2015-11-30 — End: 2015-11-30
  Administered 2015-11-30: 11:00:00
  Filled 2015-11-30: qty 4

## 2015-11-30 MED ORDER — ONDANSETRON HCL 4 MG/2ML IJ SOLN
4.0000 mg | Freq: Four times a day (QID) | INTRAMUSCULAR | Status: DC | PRN
  Administered 2015-12-01: 4 mg via INTRAVENOUS
  Filled 2015-11-30: qty 2

## 2015-11-30 MED ORDER — FENTANYL CITRATE (PF) 100 MCG/2ML IJ SOLN
INTRAMUSCULAR | Status: DC | PRN
  Administered 2015-11-30 (×2): 50 ug via INTRAVENOUS

## 2015-11-30 MED ORDER — FENTANYL CITRATE (PF) 100 MCG/2ML IJ SOLN
INTRAMUSCULAR | Status: AC
  Administered 2015-11-30: 11:00:00
  Filled 2015-11-30: qty 4

## 2015-11-30 MED ORDER — MIDAZOLAM HCL 2 MG/2ML IJ SOLN
INTRAMUSCULAR | Status: DC | PRN
  Administered 2015-11-30 (×2): 1 mg via INTRAVENOUS

## 2015-11-30 NOTE — Progress Notes (Signed)
Inpatient Diabetes Program Recommendations  AACE/ADA: New Consensus Statement on Inpatient Glycemic Control (2015)  Target Ranges:  Prepandial:   less than 140 mg/dL      Peak postprandial:   less than 180 mg/dL (1-2 hours)      Critically ill patients:  140 - 180 mg/dL   Lab Results  Component Value Date   GLUCAP 269 (H) 11/30/2015   HGBA1C 9.8 (H) 11/28/2015    Review of Glycemic Control Results for MYRON, STANKOVICH (MRN [id number]) as of 11/30/2015 13:48  Ref. Range 11/29/2015 06:11 11/29/2015 11:14 11/29/2015 16:28 11/29/2015 20:59 11/30/2015 05:58 11/30/2015 12:20  Glucose-Capillary Latest Ref Range: 65 - 99 mg/dL 253 (H) 241 (H) 99 252 (H) 398 (H) 269 (H)   Diabetes history: DM1 Outpatient Diabetes medications: Lantus 30 units hs+ Novolog 10-14 units tid with meals per sliding scale Current orders for Inpatient glycemic control: Lantus 10 units hs + Novolog 4 units tid meal coverage + Novolog correction 0-9 units tid + 0-5 units hs  Inpatient Diabetes Program Recommendations:  Spoke with patient by phone and reviewed home insulin regimen. Patient states he takes @ home: Lantus 30 units q pm and Novolog 10-14 units tid with meals based on correction and # of carbohydrates. Noted elevated CBGs. -Increase Lantus to 25 units qd  -Increase meal coverage to 6 units tid with meals  Thank you, Nani Gasser. Allisyn Kunz, RN, MSN, CDE Inpatient Glycemic Control Team Team Pager [phone] (8am-5pm) 11/30/2015 2:01 PM

## 2015-11-30 NOTE — Sedation Documentation (Signed)
Left Chest tube 150CC removed , R chest tube 720CC removed

## 2015-11-30 NOTE — Progress Notes (Signed)
[facility] Progress Note   Subjective: had bilat chest tubes placed today, no c/o's is breathing better. Had bilat chest tubes placed this am.   Vitals:   11/30/15 1125 11/30/15 1140 11/30/15 1141 11/30/15 1222  BP: (!) 155/67 (!) 143/67 (!) 146/67 (!) 169/75  Pulse: (!) 106 (!) 110 (!) 108 (!) 107  Resp: (!) 28 (!) 28 (!) 24   Temp:    99.3 F (37.4 C)  TempSrc:    Oral  SpO2: 100% 100% 100% 95%  Weight:      Height:        Inpatient medications: . calcitRIOL  1.25 mcg Oral Q M,W,F-1800  . ceFEPime (MAXIPIME) IV  1 g Intravenous Q24H  . guaiFENesin  600 mg Oral BID  . heparin  5,000 Units Subcutaneous Q8H  . insulin aspart  0-5 Units Subcutaneous QHS  . insulin aspart  0-9 Units Subcutaneous TID WC  . insulin aspart  4 Units Subcutaneous TID WC  . insulin glargine  10 Units Subcutaneous QHS  . sodium chloride flush  3 mL Intravenous Q12H  . [START ON 12/01/2015] vancomycin  1,000 mg Intravenous Q M,W,F-HD     sodium chloride, acetaminophen, fentaNYL, HYDROcodone-acetaminophen, midazolam, ondansetron, sodium chloride flush  Exam: General: Well developed, well nourished, in no acute distress. Head: Normocephalic, atraumatic, sclera non-icteric, mucus membranes are moist. Neck: Supple without lymphadenopathy/masses. JVD not elevated. Lungs: Clear bilaterally in upper lobes, faint crackles in B bases. Heart: RRR with normal S1, S2. No murmurs, rubs, or gallops appreciated. Abdomen: Soft, non-tender, non-distended with normoactive bowel sounds. No rebound/guarding.  Musculoskeletal:  Strength and tone appear normal for age. Lower extremities: Trace LE edema. Neuro: Alert and oriented X 3. Moves all extremities spontaneously. Psych:  Responds to questions appropriately with a normal affect. Dialysis Access: RUE AVF + thrill/bruit   Dialysis Orders:  MWF at [facility] 4h   100kg   2/2 bath  AVF RUA   Hep 5000 - Calcitriol 1.44mcg PO q HD -  No venofer yet, but tsat 12% on 10/11 - No ESA currently.  Assessment: 1.  Pneumonia v. Empyema: Restarted on Vanc/Cefepime. Sputum Cx and BCx 10/17 pending. + Urine Strep antigen. Got two chest tubes placed today, one on each side.  2.  ESRD: Continue MWF schedule for now. No heparin with HD in case needs procedure 3.  Hypertension/volume: HyPOtensive on admit, improved with IV fluids. BP meds on hold for now. Will try to remove some fluid with HD. 4.  Anemia: Hgb 10.8. No ESA for now, likely resume soon. No IV iron for now since active infection. 5.  Metabolic bone disease: Ca 7.8. Continue binders/VDRA. Will check Phos. 6.  Nutrition:  Alb 2.5; start Nepro supplements. 7. Type I DM: Per primary.  Plan - HD Friday, abx/ chest drainage   [name] MD [place] pager [phone]   11/30/2015, 1:14 PM    Recent Labs Lab 11/28/15 1618 11/29/15 1258 11/30/15 0237  NA 131* 131* 131*  K 4.6 5.0 4.7  CL 89* 91* 91*  CO2 27 26 27   GLUCOSE 322* 239* 381*  BUN 41* 44* 26*  CREATININE 10.49* 12.16* 8.98*  CALCIUM 7.8* 8.5* 8.2*  PHOS  --  6.3*  --     Recent Labs Lab 11/28/15 1618 11/29/15 1258  AST 15  --   ALT 14*  --   ALKPHOS 119  --   BILITOT 1.3*  --   PROT 7.9  --  ALBUMIN 2.5* 2.2*    Recent Labs Lab 11/28/15 1105 11/28/15 1618 11/29/15 1258 11/30/15 0237  WBC 13.7* 12.9* 11.7* 11.0*  NEUTROABS 9.3* 8.6*  --   --   HGB 10.7* 10.8* 10.2* 10.3*  HCT 32.2* 32.0* 30.7* 30.4*  MCV 96.7 96.7 93.3 93.3  PLT 589* 538* 556* 535*   Iron/TIBC/Ferritin/ %Sat No results found for: IRON, TIBC, FERRITIN, IRONPCTSAT

## 2015-11-30 NOTE — Progress Notes (Signed)
PROGRESS NOTE  Lance [name] MVH:[id number] DOB: 01-Jun-1970 DOA: 11/28/2015 PCP: Mickie Hillier, MD   LOS: 2 days   Brief Narrative: 45 y.o. Male with a history of ESRD on hemodialysis, type 1 diabetes, and HTN who presented to the ED under instruction of PCP for low-grade fever, nonproductive cough, dyspnea, and abnormal chest XR findings. Patient was admitted to [facility] in the first week of October for pneumonia. Growth showed strep pneumo from blood cultures. He was discharged on vanomycin with dialysis for 4 weeks, although the patient reports only receiving 4 treatments. Chest x-ray on 11/15/15 showed loculated right pleural effusion concerning for empyema. He felt fine after discharge for about 1 week. He then developed nausea, vomiting, dyspnea, and a temp of 99. He also developed right lateral mid-back pain. Continues to have nonproductive cough, orthopnea and slight dyspnea on exertion. Patient denies chest pain and palpitations. Patient underwent CT guided pigtail catheter placement bilaterally 11/30/15.  Assessment & Plan: Principal Problem:   Loculated pleural effusion Active Problems:   Diabetes mellitus type 1 (HCC)   Essential hypertension, benign   Chronic diastolic heart failure (HCC)   Hyponatremia   ESRD (end stage renal disease) on dialysis (HCC)   Bilateral pleural effusion   HCAP (healthcare-associated pneumonia)   HCAP (healthcare-associated pneumonia) / Loculated pleural effusion  - Chest x-ray on 11/15/15 showed loculated right pleural effusion concerning for empyema.  - Patient underwent CT guided bilateral pigtail catheter placement 10/19 - Continue vancomycin  - Aspirate fluid culture results pending, unfortunately fluid labs cancelled inappropriately by laboratory division. Reordered again today  Diabetes mellitus type 1  - A1c 9.7% in 2015; serum glucose is 322 on admission.  - continue Lantus / SSI. Suspect poorly controlled CBGs in the setting of  active infection  Essential Hypertension - home BP meds on hold since admission - monitor BP post HD if remains elevated will resume   Hyponatremia  - HD yesterday without incident  ESRD (end stage renal disease) on dialysis [facility])  - consulted nephrology, communicated with Dr. Jonnie Finner  Normocytic anemia - in the setting of ESRD   DVT prophylaxis: heparin Code Status: Full Family Communication: Wife at bedside Disposition Plan: TBD  Consultants:   Cardiothoracic surgery  Procedures:   HD: 11/29/15 without incident  CT guided pigtail catheter placement bilaterally  Antimicrobials:  Cefepime   Vancomycin  Subjective: Patient complains of orthopnea, dyspnea on exertion, nonproductive cough, and nausea. Denies chest pain, palpitations, and edema.   Objective: Vitals:   11/30/15 1125 11/30/15 1140 11/30/15 1141 11/30/15 1222  BP: (!) 155/67 (!) 143/67 (!) 146/67 (!) 169/75  Pulse: (!) 106 (!) 110 (!) 108 (!) 107  Resp: (!) 28 (!) 28 (!) 24   Temp:    99.3 F (37.4 C)  TempSrc:    Oral  SpO2: 100% 100% 100% 95%  Weight:      Height:        Intake/Output Summary (Last 24 hours) at 11/30/15 1257 Last data filed at 11/30/15 1134  Gross per 24 hour  Intake              360 ml  Output             4270 ml  Net            -3910 ml   Filed Weights   11/29/15 1340 11/29/15 1800 11/30/15 0453  Weight: 102.4 kg (225 lb 12 oz) 99 kg (218 lb 4.1 oz) 99.2 kg (  218 lb 9.6 oz)    Examination: Constitutional: NAD Vitals:   11/30/15 1125 11/30/15 1140 11/30/15 1141 11/30/15 1222  BP: (!) 155/67 (!) 143/67 (!) 146/67 (!) 169/75  Pulse: (!) 106 (!) 110 (!) 108 (!) 107  Resp: (!) 28 (!) 28 (!) 24   Temp:    99.3 F (37.4 C)  TempSrc:    Oral  SpO2: 100% 100% 100% 95%  Weight:      Height:       Eyes: lids and conjunctivae normal ENMT: Mucous membranes are moist.  Respiratory: decreased breath sounds bilaterally, no wheezing, no crackles. Normal respiratory  effort. No accessory muscle use.  Cardiovascular: Regular rate and rhythm, no murmurs / rubs / gallops. No LE edema.   Skin: no rashes, lesions, ulcers.   Data Reviewed: I have personally reviewed following labs and imaging studies  CBC:  Recent Labs Lab 11/28/15 1105 11/28/15 1618 11/29/15 1258 11/30/15 0237  WBC 13.7* 12.9* 11.7* 11.0*  NEUTROABS 9.3* 8.6*  --   --   HGB 10.7* 10.8* 10.2* 10.3*  HCT 32.2* 32.0* 30.7* 30.4*  MCV 96.7 96.7 93.3 93.3  PLT 589* 538* 556* 573*   Basic Metabolic Panel:  Recent Labs Lab 11/28/15 1618 11/29/15 1258 11/30/15 0237  NA 131* 131* 131*  K 4.6 5.0 4.7  CL 89* 91* 91*  CO2 27 26 27   GLUCOSE 322* 239* 381*  BUN 41* 44* 26*  CREATININE 10.49* 12.16* 8.98*  CALCIUM 7.8* 8.5* 8.2*  PHOS  --  6.3*  --    GFR: Estimated Creatinine Clearance: 12.3 mL/min (by C-G formula based on SCr of 8.98 mg/dL (H)). Liver Function Tests:  Recent Labs Lab 11/28/15 1618 11/29/15 1258  AST 15  --   ALT 14*  --   ALKPHOS 119  --   BILITOT 1.3*  --   PROT 7.9  --   ALBUMIN 2.5* 2.2*   No results for input(s): LIPASE, AMYLASE in the last 168 hours. No results for input(s): AMMONIA in the last 168 hours. Coagulation Profile:  Recent Labs Lab 11/30/15 0237  INR 1.16   Cardiac Enzymes: No results for input(s): CKTOTAL, CKMB, CKMBINDEX, TROPONINI in the last 168 hours. BNP (last 3 results) No results for input(s): PROBNP in the last 8760 hours. HbA1C:  Recent Labs  11/28/15 1618  HGBA1C 9.8*   CBG:  Recent Labs Lab 11/29/15 1114 11/29/15 1628 11/29/15 2059 11/30/15 0558 11/30/15 1220  GLUCAP 241* 99 252* 398* 269*   Lipid Profile: No results for input(s): CHOL, HDL, LDLCALC, TRIG, CHOLHDL, LDLDIRECT in the last 72 hours. Thyroid Function Tests: No results for input(s): TSH, T4TOTAL, FREET4, T3FREE, THYROIDAB in the last 72 hours. Anemia Panel: No results for input(s): VITAMINB12, FOLATE, FERRITIN, TIBC, IRON, RETICCTPCT  in the last 72 hours. Urine analysis:    Component Value Date/Time   COLORURINE YELLOW 05/05/2014 0904   APPEARANCEUR CLEAR 05/05/2014 0904   LABSPEC 1.020 05/05/2014 0904   PHURINE 6.5 05/05/2014 0904   GLUCOSEU 100 (A) 05/05/2014 0904   HGBUR LARGE (A) 05/05/2014 0904   BILIRUBINUR NEGATIVE 05/05/2014 0904   KETONESUR NEGATIVE 05/05/2014 0904   PROTEINUR >300 (A) 05/05/2014 0904   UROBILINOGEN 0.2 05/05/2014 0904   NITRITE NEGATIVE 05/05/2014 0904   LEUKOCYTESUR NEGATIVE 05/05/2014 0904   Sepsis Labs: Invalid input(s): PROCALCITONIN, LACTICIDVEN  Recent Results (from the past 240 hour(s))  Culture, blood (single)     Status: None (Preliminary result)   Collection Time: 11/28/15 11:05 AM  Result Value Ref Range Status   Specimen Description BLOOD LEFT HAND  Final   Special Requests   Final    BOTTLES DRAWN AEROBIC AND ANAEROBIC AEB=7CC ANA=6CC   Culture NO GROWTH 2 DAYS  Final   Report Status PENDING  Incomplete  Blood culture (routine x 2)     Status: None (Preliminary result)   Collection Time: 11/28/15  4:18 PM  Result Value Ref Range Status   Specimen Description BLOOD RIGHT FOREARM  Final   Special Requests   Final    BOTTLES DRAWN AEROBIC AND ANAEROBIC AEB=8CC ANA=6CC   Culture NO GROWTH 2 DAYS  Final   Report Status PENDING  Incomplete  Blood culture (routine x 2)     Status: None (Preliminary result)   Collection Time: 11/28/15  4:24 PM  Result Value Ref Range Status   Specimen Description BLOOD LEFT HAND  Final   Special Requests   Final    BOTTLES DRAWN AEROBIC AND ANAEROBIC AEB=6CC ANA=5CC   Culture NO GROWTH 2 DAYS  Final   Report Status PENDING  Incomplete      Radiology Studies: Ct Chest W Contrast  Result Date: 11/28/2015 CLINICAL DATA:  Shortness of breath with pneumonia and low grade fever. Abnormal chest radiograph. EXAM: CT CHEST WITH CONTRAST TECHNIQUE: Multidetector CT imaging of the chest was performed during intravenous contrast  administration. CONTRAST:  46mL ISOVUE-300 IOPAMIDOL (ISOVUE-300) INJECTION 61% COMPARISON:  11/28/2015.  11/15/2015.  11/12/2015. FINDINGS: Cardiovascular: Aortic atherosclerosis. No aneurysm or dissection. Coronary artery atherosclerosis. Heart at the upper limits of normal in size. No pericardial fluid. No large pulmonary arterial filling defects. The study was not done in the form of a pulmonary arterial CTA. Mediastinum/Nodes: No enlarged or low-density nodes. No mediastinal mass. Lungs/Pleura: Bilateral pleural effusions with some loculation. This raises concern for the possibility of empyema. There is pulmonary volume loss in the lower lobes left more than right. Upper lobes are clear. No underlying emphysema. Upper Abdomen: Negative Musculoskeletal: No significant finding. IMPRESSION: Loculated bilateral pleural effusions with associated compressive pulmonary atelectasis in the lower lungs left more than right. The loculated appearance raises the possibility of empyema. This can be seen in persons with chronic pleural scarring. Areas in the lower lobes showing volume loss could be secondary to simple compressive atelectasis or atelectatic pneumonia. Aortic atherosclerosis.  Coronary artery atherosclerosis. Electronically Signed   By: Nelson Chimes M.D.   On: 11/28/2015 17:48     Scheduled Meds: . calcitRIOL  1.25 mcg Oral Q M,W,F-1800  . ceFEPime (MAXIPIME) IV  1 g Intravenous Q24H  . guaiFENesin  600 mg Oral BID  . heparin  5,000 Units Subcutaneous Q8H  . insulin aspart  0-5 Units Subcutaneous QHS  . insulin aspart  0-9 Units Subcutaneous TID WC  . insulin aspart  4 Units Subcutaneous TID WC  . insulin glargine  10 Units Subcutaneous QHS  . sodium chloride flush  3 mL Intravenous Q12H  . [START ON 12/01/2015] vancomycin  1,000 mg Intravenous Q M,W,F-HD   Continuous Infusions:     Marzetta Board, MD, PhD [facility] Pager [phone]  If 7PM-7AM, please contact  night-coverage [email or web address] Password [email or web address] 11/30/2015, 12:57 PM

## 2015-11-30 NOTE — Discharge Instructions (Signed)
Chest Tube A chest tube is a small, flexible drainage tube that is put into the chest. The tube drains fluid, blood, or extra air that has built up between the lungs and the inside of the chest wall (pleural space). Fluid or air can build up in this area for various reasons. When this occurs, it can prevent the lung from expanding completely and cause breathing problems. This can be dangerous. The chest tube allows the lung to re-expand. The procedure to put in the chest tube involves inserting the tube through the skin between the ribs and into the pleural space. LET [facility] CARE PROVIDER KNOW ABOUT:   Any allergies you have.  All medicines you are taking, including vitamins, herbs, eye drops, creams, and over-the-counter medicines.  Previous problems you or members of your family have had with the use of anesthetics.  Any blood disorders you have.  Previous surgeries you have had.  Medical conditions you have. RISKS AND COMPLICATIONS Generally, this is a safe procedure. However, as with any procedure, problems can occur. Possible problems include:  Bleeding.  Injury to the lung.  Infection.  Chest tube failing to work properly, usually due to leaking of air around the tube, or tube positioning in a place where all of the fluid or air cannot be drained.  Problems related to the use of anesthetics or pain medicines. BEFORE THE PROCEDURE  Ask your health care provider if there are any special preparatory instructions such as not eating before the procedure. Follow these instructions exactly. PROCEDURE  The area where the chest tube will be inserted is numbed with a medicine (local anesthetic). You may be given medicine for pain and medicine to help you relax (sedative). An incision is made between the ribs, and a small opening is made through the inner lining of the chest wall. The chest tube is inserted through this opening and into the pleural space. The other end of the chest  tube may be connected to a plastic container that collects the fluid drained from the pleural space and has sterile water to make a one-way seal, or "water seal," that prevents air from going back into the pleural space. Suction is sometimes attached to the system for drainage. A stitch (suture) or tape is used to keep the tube in place.  AFTER THE PROCEDURE  A chest X-ray will be done to check the position of the chest tube. You will be monitored for breathing difficulties, air leaks in the chest tube, and the need for additional oxygen. You will be encouraged to breathe deeply. You may be given antibiotic medicine to prevent or treat infection. The chest tube will stay in place until all the extra air or fluid has drained from the chest. You will likely need to stay in the hospital until the chest tube is removed. In rare cases, you may go home with the chest tube in place.   This information is not intended to replace advice given to you by your health care provider. Make sure you discuss any questions you have with your health care provider.   Document Released: 05/08/2006 Document Revised: 02/02/2013 Document Reviewed: 08/05/2012 Elsevier Interactive Patient Education 2016 [place]. Chest Tube, Care After Refer to this sheet in the next few weeks. These instructions provide you with information on caring for yourself after your procedure. Your health care provider may also give you more specific instructions. Your treatment has been planned according to current medical practices, but problems sometimes occur.  Call your health care provider if you have any problems or questions after your procedure.  WHAT TO EXPECT AFTER THE PROCEDURE After the procedure, you will have a thin tube that passes through the skin between your ribs and into your chest. It is normal to have some pain or discomfort at the site of the chest tube insertion.  HOME CARE INSTRUCTIONS If you go home with a chest tube still  in place, follow these instructions:   Be careful to avoid any activity that may cause your chest tube to become dislodged.  Keep two clamps nearby. If any tube gets disconnected, clamp the tubing closest to your body with both clamps. Call your health care provider for directions. You may need to go to the emergency department if the problem cannot be solved.   Check the tubing often to make sure it is not kinked. When the tubing is kinked, draining will not take place properly, and you may have some trouble breathing.   Take these steps if the chest tube falls out:   Do not panic.   Open a package of gauze coated with petroleum jelly.   Open a package of dry, square gauze.   Cover the wound first with the petroleum jelly gauze and then cover that with the dry, square gauze.   Tape the dry, square gauze in place.   After you have covered the site, call your health care provider for instructions. Your health care provider will decide if you need to go to the emergency department.   You may shower with the chest tube in place if your health care provider approves. Cover the area where the chest tube comes out with a small plastic bag and tape it well. Bring the drainage device to the shower area and leave it outside the shower curtain or door. The unit should never be under the direct flow of water.   Perform gentle exercise such as walking as directed by your health care provider. Talk to your health care provider about any other activity or exercise you want to do.   Only take over-the-counter or prescription medicines as directed by your health care provider.   Follow up with your health care provider as directed. SEEK MEDICAL CARE IF:  You have redness or swelling at the incision.   Your incision has opened (the edges are not staying together).  You have drainage from the incision area.   Your drainage from the chest tube changes color or becomes red or bloody.    Your pain is not controlled with the medicines prescribed.  SEEK IMMEDIATE MEDICAL CARE IF:  You have a fever.   You have any new trouble with breathing.   You develop any chest pain.   You develop unusual sweating.  You have weakness.  You feel lightheaded, or you faint.  Your connecting tubes become disconnected.  You develop red streaking of the skin that extends above or below the incision.  Your chest tube falls out.    This information is not intended to replace advice given to you by your health care provider. Make sure you discuss any questions you have with your health care provider.   Document Released: 11/18/2012 Document Reviewed: 11/18/2012 Elsevier Interactive Patient Education [employer].

## 2015-11-30 NOTE — Procedures (Signed)
Technically successful CT guided placed of a 12 Fr drainage catheter placement into the left pleural space yielding 150 cc of serous, amber color fluid.  Technically successful CT guided placed of a 12 Fr drainage catheter placement into the right pleural space yielding 700 cc of serous, amber color fluid.    A sample from each pleural space was sent to the laboratory for analysis.   EBL: None  No immediate post procedural complications.   Ronny Bacon, MD Pager #: [phone]

## 2015-12-01 ENCOUNTER — Inpatient Hospital Stay (HOSPITAL_COMMUNITY): Payer: Medicare Other

## 2015-12-01 LAB — GLUCOSE, CAPILLARY
GLUCOSE-CAPILLARY: 331 mg/dL — AB (ref 65–99)
GLUCOSE-CAPILLARY: 332 mg/dL — AB (ref 65–99)
Glucose-Capillary: 143 mg/dL — ABNORMAL HIGH (ref 65–99)
Glucose-Capillary: 325 mg/dL — ABNORMAL HIGH (ref 65–99)

## 2015-12-01 LAB — BODY FLUID CELL COUNT WITH DIFFERENTIAL
EOS FL: 0 %
Eos, Fluid: 1 %
LYMPHS FL: 85 %
LYMPHS FL: 79 %
MONOCYTE-MACROPHAGE-SEROUS FLUID: 8 % — AB (ref 50–90)
Monocyte-Macrophage-Serous Fluid: 15 % — ABNORMAL LOW (ref 50–90)
NEUTROPHIL FLUID: 7 % (ref 0–25)
NEUTROPHIL FLUID: 5 % (ref 0–25)
WBC FLUID: 168 uL (ref 0–1000)
WBC FLUID: 16 uL (ref 0–1000)

## 2015-12-01 LAB — CBC
HCT: 29.6 % — ABNORMAL LOW (ref 39.0–52.0)
HEMOGLOBIN: 10.1 g/dL — AB (ref 13.0–17.0)
MCH: 31.7 pg (ref 26.0–34.0)
MCHC: 34.1 g/dL (ref 30.0–36.0)
MCV: 92.8 fL (ref 78.0–100.0)
Platelets: 518 10*3/uL — ABNORMAL HIGH (ref 150–400)
RBC: 3.19 MIL/uL — AB (ref 4.22–5.81)
RDW: 15.2 % (ref 11.5–15.5)
WBC: 9.9 10*3/uL (ref 4.0–10.5)

## 2015-12-01 LAB — RENAL FUNCTION PANEL
ALBUMIN: 2.2 g/dL — AB (ref 3.5–5.0)
ANION GAP: 16 — AB (ref 5–15)
BUN: 40 mg/dL — ABNORMAL HIGH (ref 6–20)
CALCIUM: 8.6 mg/dL — AB (ref 8.9–10.3)
CO2: 21 mmol/L — ABNORMAL LOW (ref 22–32)
CREATININE: 12.1 mg/dL — AB (ref 0.61–1.24)
Chloride: 90 mmol/L — ABNORMAL LOW (ref 101–111)
GFR calc non Af Amer: 4 mL/min — ABNORMAL LOW (ref >60)
GFR, EST AFRICAN AMERICAN: 5 mL/min — AB (ref >60)
GLUCOSE: 345 mg/dL — AB (ref 65–99)
PHOSPHORUS: 8.7 mg/dL — AB (ref 2.5–4.6)
Potassium: 4.6 mmol/L (ref 3.5–5.1)
SODIUM: 127 mmol/L — AB (ref 135–145)

## 2015-12-01 LAB — PROTEIN, BODY FLUID
Total protein, fluid: 4.7 g/dL
Total protein, fluid: 5.1 g/dL

## 2015-12-01 LAB — LACTATE DEHYDROGENASE, PLEURAL OR PERITONEAL FLUID
LD FL: 191 U/L — AB (ref 3–23)
LD, Fluid: 159 U/L — ABNORMAL HIGH (ref 3–23)

## 2015-12-01 MED ORDER — CARVEDILOL 12.5 MG PO TABS
12.5000 mg | ORAL_TABLET | Freq: Two times a day (BID) | ORAL | Status: DC
  Administered 2015-12-01 – 2015-12-03 (×4): 12.5 mg via ORAL
  Filled 2015-12-01 (×4): qty 1

## 2015-12-01 MED ORDER — INSULIN GLARGINE 100 UNIT/ML ~~LOC~~ SOLN
15.0000 [IU] | Freq: Every day | SUBCUTANEOUS | Status: DC
  Administered 2015-12-01: 15 [IU] via SUBCUTANEOUS
  Filled 2015-12-01 (×2): qty 0.15

## 2015-12-01 MED ORDER — DOCUSATE SODIUM 100 MG PO CAPS
100.0000 mg | ORAL_CAPSULE | Freq: Two times a day (BID) | ORAL | Status: DC | PRN
  Administered 2015-12-01: 100 mg via ORAL
  Filled 2015-12-01: qty 1

## 2015-12-01 MED ORDER — VANCOMYCIN HCL IN DEXTROSE 1-5 GM/200ML-% IV SOLN
INTRAVENOUS | Status: AC
  Administered 2015-12-01: 1000 mg via INTRAVENOUS
  Filled 2015-12-01: qty 200

## 2015-12-01 MED ORDER — ONDANSETRON HCL 4 MG/2ML IJ SOLN
INTRAMUSCULAR | Status: AC
  Filled 2015-12-01: qty 2

## 2015-12-01 MED ORDER — HYDROCODONE-ACETAMINOPHEN 5-325 MG PO TABS
ORAL_TABLET | ORAL | Status: AC
  Filled 2015-12-01: qty 2

## 2015-12-01 NOTE — Progress Notes (Signed)
Patient ID: Lance [name], male   DOB: [birth year], 45 y.o.   MRN: [id number]    Referring Physician(s): Dr. Lanelle Bal  Supervising Physician: Aletta Edouard  Patient Status: [facility] - In-pt  Chief Complaint: Bilateral loculated pleural effusions  Subjective: Patient feels as if he can breath better today.  In HD upon evaluation.  Allergies: Atorvastatin and Minoxidil  Medications: Prior to Admission medications   Medication Sig Start Date End Date Taking? Authorizing Provider  amLODipine (NORVASC) 10 MG tablet TAKE 1 TABLET BY MOUTH EVERY DAY Patient taking differently: TAKE 1 TABLET BY MOUTH EVERY DAY IN THE EVENING 07/18/14  Yes [name], MD  carvedilol (COREG) 25 MG tablet TAKE 2 TABLETS BY MOUTH TWICE A DAY Patient taking differently: TAKE 1 TABLET BY MOUTH TWICE A DAY 01/24/14  Yes Satira Sark, MD  insulin aspart (NOVOLOG) 100 UNIT/ML injection Inject 10 Units into the skin 3 (three) times daily before meals. Patient taking differently: Inject 10-14 Units into the skin 3 (three) times daily before meals. Per sliding scale 12/29/13  Yes Delfina Redwood, MD  ondansetron (ZOFRAN ODT) 4 MG disintegrating tablet Take 1 tablet (4 mg total) by mouth every 6 (six) hours as needed for nausea or vomiting. 11/28/15  Yes [name], MD  diclofenac sodium (VOLTAREN) 1 % GEL Apply 4 g topically 4 (four) times daily. Patient not taking: Reported on 11/28/2015 11/16/15   Thurnell Lose, MD    Vital Signs: BP 133/65   Pulse (!) 105   Temp 98.6 F (37 C) (Oral)   Resp 16   Ht 6\' 3"  (1.905 m)   Wt 220 lb 14.4 oz (100.2 kg)   SpO2 96%   BMI 27.61 kg/m   Physical Exam: Chest: CTAB.  Bilateral chest tubes in place.  Right-sided site unable to be evaluate secondary to his HD hook up.  The left site is c/d/i.  Both sites do have stopcocks attached to the drains. No air leaks, both with serous drainage.  Left with 210cc.  Right with 760cc.  Imaging: Dg Chest 2  View  Result Date: 11/28/2015 CLINICAL DATA:  Aspiration pneumonia EXAM: CHEST  2 VIEW COMPARISON:  11/15/2015 FINDINGS: Progression of right pleural effusion which appears loculated. Possible developing empyema. There appears to be fluid in the major fissure superiorly as well as in the lung base on the right. Improving right lower lobe infiltrate Progression of left lower lobe infiltrate since the prior study. Possible small left effusion. Negative for heart failure.  No acute skeletal abnormality. IMPRESSION: Loculated right pleural effusion has developed since the prior study and may represent an empyema given the recent history of pneumonia. Chest CT with contrast recommended for further evaluation. Progression of left lower lobe infiltrate which may represent persistent pneumonia. Small left effusion. Electronically Signed   By: Franchot Gallo M.D.   On: 11/28/2015 11:31   Ct Chest W Contrast  Result Date: 11/28/2015 CLINICAL DATA:  Shortness of breath with pneumonia and low grade fever. Abnormal chest radiograph. EXAM: CT CHEST WITH CONTRAST TECHNIQUE: Multidetector CT imaging of the chest was performed during intravenous contrast administration. CONTRAST:  31mL ISOVUE-300 IOPAMIDOL (ISOVUE-300) INJECTION 61% COMPARISON:  11/28/2015.  11/15/2015.  11/12/2015. FINDINGS: Cardiovascular: Aortic atherosclerosis. No aneurysm or dissection. Coronary artery atherosclerosis. Heart at the upper limits of normal in size. No pericardial fluid. No large pulmonary arterial filling defects. The study was not done in the form of a pulmonary arterial CTA. Mediastinum/Nodes: No enlarged  or low-density nodes. No mediastinal mass. Lungs/Pleura: Bilateral pleural effusions with some loculation. This raises concern for the possibility of empyema. There is pulmonary volume loss in the lower lobes left more than right. Upper lobes are clear. No underlying emphysema. Upper Abdomen: Negative Musculoskeletal: No significant  finding. IMPRESSION: Loculated bilateral pleural effusions with associated compressive pulmonary atelectasis in the lower lungs left more than right. The loculated appearance raises the possibility of empyema. This can be seen in persons with chronic pleural scarring. Areas in the lower lobes showing volume loss could be secondary to simple compressive atelectasis or atelectatic pneumonia. Aortic atherosclerosis.  Coronary artery atherosclerosis. Electronically Signed   By: Nelson Chimes M.D.   On: 11/28/2015 17:48   Ct Perc Pleural Drain W/indwell Cath W/img Guide  Result Date: 11/30/2015 INDICATION: History of hospital acquired pneumonia, now with bilateral pleural effusions. Patient has been evaluated by the cardiothoracic surgery service and deemed a poor candidate for VATS procedure. As such, request made for placement of bilateral percutaneous drainage catheter placements. EXAM: 1. CT-GUIDED LEFT-SIDED CHEST TUBE DRAINAGE CATHETER PLACEMENT 2. CT-GUIDED RIGHT-SIDED CHEST TUBE DRAINAGE CATHETER PLACEMENT COMPARISON:  Chest CT - 11/28/2015 MEDICATIONS: The patient is currently admitted to the hospital and receiving intravenous antibiotics. The antibiotics were administered within an appropriate time frame prior to the initiation of the procedure. ANESTHESIA/SEDATION: Moderate (conscious) sedation was employed during this procedure. A total of Versed 1 mg and Fentanyl 50 mcg was administered intravenously. Moderate Sedation Time: 15 minutes. The patient's level of consciousness and vital signs were monitored continuously by radiology nursing throughout the procedure under my direct supervision. CONTRAST:  None COMPLICATIONS: None immediate. PROCEDURE: Informed written consent was obtained from the patient after a discussion of the risks, benefits and alternatives to treatment. The patient was placed supine on the CT gantry and a pre procedural CT was performed re-demonstrating the known partially loculated  bilateral pleural effusions. The procedure was planned. A timeout was performed prior to the initiation of the procedure. The skin overlying the inferior lateral aspects of the bilateral lower thoraces were prepped and draped in the usual sterile fashion. Beginning with the left-sided pleural effusion, the overlying soft tissues were anesthetized with 1% lidocaine with epinephrine. Appropriate trajectory was planned with the use of a 22 gauge spinal needle. An 18 gauge trocar needle was advanced into the abscess/fluid collection and a short Amplatz super stiff wire was coiled within the collection. Appropriate positioning was confirmed with a limited CT scan. The tract was serially dilated allowing placement of a 12 [place] all-purpose drainage catheter. Appropriate positioning was confirmed with a limited postprocedural CT scan. Approximately 150 cc of serous, amber colored fluid was aspirated following placement of the left-sided chest tube. Attention was now paid towards placement of the right-sided chest tube. The overlying soft tissues were anesthetized with 1% lidocaine with epinephrine. Appropriate trajectory was planned with the use of a 22 gauge spinal needle. An 18 gauge trocar needle was advanced into the abscess/fluid collection and a short Amplatz super stiff wire was coiled within the collection. Appropriate positioning was confirmed with a limited CT scan. The tract was serially dilated allowing placement of a 12 [place] all-purpose drainage catheter. Appropriate positioning was confirmed with a limited postprocedural CT scan. Approximately 700 cc of serous, amber colored fluid was aspirated following placement of the right-sided chest tube. The bilateral chest tubes connected to pleural vac devices and sutured in place. A dressing was placed. The patient tolerated the procedure well without immediate  post procedural complication. IMPRESSION: 1. Successful CT guided placement of a 12 [place] all purpose  drain catheter into the left pleural space with aspiration of 150 mL of serous, amber color fluid. 2. Successful CT-guided placement of a 77 [place] all-purpose drainage catheter into the right pleural space with aspiration of approximately 700 cc of serous, amber colored fluid. 3. Separate samples from each chest tube were sent to the laboratory as requested by the ordering clinical team. Electronically Signed   By: Sandi Mariscal M.D.   On: 11/30/2015 14:49   Ct Perc Pleural Drain W/indwell Cath W/img Guide  Result Date: 11/30/2015 INDICATION: History of hospital acquired pneumonia, now with bilateral pleural effusions. Patient has been evaluated by the cardiothoracic surgery service and deemed a poor candidate for VATS procedure. As such, request made for placement of bilateral percutaneous drainage catheter placements. EXAM: 1. CT-GUIDED LEFT-SIDED CHEST TUBE DRAINAGE CATHETER PLACEMENT 2. CT-GUIDED RIGHT-SIDED CHEST TUBE DRAINAGE CATHETER PLACEMENT COMPARISON:  Chest CT - 11/28/2015 MEDICATIONS: The patient is currently admitted to the hospital and receiving intravenous antibiotics. The antibiotics were administered within an appropriate time frame prior to the initiation of the procedure. ANESTHESIA/SEDATION: Moderate (conscious) sedation was employed during this procedure. A total of Versed 1 mg and Fentanyl 50 mcg was administered intravenously. Moderate Sedation Time: 15 minutes. The patient's level of consciousness and vital signs were monitored continuously by radiology nursing throughout the procedure under my direct supervision. CONTRAST:  None COMPLICATIONS: None immediate. PROCEDURE: Informed written consent was obtained from the patient after a discussion of the risks, benefits and alternatives to treatment. The patient was placed supine on the CT gantry and a pre procedural CT was performed re-demonstrating the known partially loculated bilateral pleural effusions. The procedure was planned. A  timeout was performed prior to the initiation of the procedure. The skin overlying the inferior lateral aspects of the bilateral lower thoraces were prepped and draped in the usual sterile fashion. Beginning with the left-sided pleural effusion, the overlying soft tissues were anesthetized with 1% lidocaine with epinephrine. Appropriate trajectory was planned with the use of a 22 gauge spinal needle. An 18 gauge trocar needle was advanced into the abscess/fluid collection and a short Amplatz super stiff wire was coiled within the collection. Appropriate positioning was confirmed with a limited CT scan. The tract was serially dilated allowing placement of a 12 [place] all-purpose drainage catheter. Appropriate positioning was confirmed with a limited postprocedural CT scan. Approximately 150 cc of serous, amber colored fluid was aspirated following placement of the left-sided chest tube. Attention was now paid towards placement of the right-sided chest tube. The overlying soft tissues were anesthetized with 1% lidocaine with epinephrine. Appropriate trajectory was planned with the use of a 22 gauge spinal needle. An 18 gauge trocar needle was advanced into the abscess/fluid collection and a short Amplatz super stiff wire was coiled within the collection. Appropriate positioning was confirmed with a limited CT scan. The tract was serially dilated allowing placement of a 12 [place] all-purpose drainage catheter. Appropriate positioning was confirmed with a limited postprocedural CT scan. Approximately 700 cc of serous, amber colored fluid was aspirated following placement of the right-sided chest tube. The bilateral chest tubes connected to pleural vac devices and sutured in place. A dressing was placed. The patient tolerated the procedure well without immediate post procedural complication. IMPRESSION: 1. Successful CT guided placement of a 12 French all purpose drain catheter into the left pleural space with aspiration  of 150 mL of serous,  amber color fluid. 2. Successful CT-guided placement of a 25 French all-purpose drainage catheter into the right pleural space with aspiration of approximately 700 cc of serous, amber colored fluid. 3. Separate samples from each chest tube were sent to the laboratory as requested by the ordering clinical team. Electronically Signed   By: Sandi Mariscal M.D.   On: 11/30/2015 14:49    Labs:  CBC:  Recent Labs  11/28/15 1618 11/29/15 1258 11/30/15 0237 12/01/15 0712  WBC 12.9* 11.7* 11.0* 9.9  HGB 10.8* 10.2* 10.3* 10.1*  HCT 32.0* 30.7* 30.4* 29.6*  PLT 538* 556* 535* 518*    COAGS:  Recent Labs  11/30/15 0237  INR 1.16  APTT 35    BMP:  Recent Labs  11/28/15 1618 11/29/15 1258 11/30/15 0237 12/01/15 0713  NA 131* 131* 131* 127*  K 4.6 5.0 4.7 4.6  CL 89* 91* 91* 90*  CO2 27 26 27  21*  GLUCOSE 322* 239* 381* 345*  BUN 41* 44* 26* 40*  CALCIUM 7.8* 8.5* 8.2* 8.6*  CREATININE 10.49* 12.16* 8.98* 12.10*  GFRNONAA 5* 4* 6* 4*  GFRAA 6* 5* 7* 5*    LIVER FUNCTION TESTS:  Recent Labs  11/12/15 0305  11/15/15 0745 11/28/15 1618 11/29/15 1258 12/01/15 0713  BILITOT 0.9  --   --  1.3*  --   --   AST 20  --   --  15  --   --   ALT 16*  --   --  14*  --   --   ALKPHOS 64  --   --  119  --   --   PROT 8.0  --   --  7.9  --   --   ALBUMIN 3.3*  < > 2.4* 2.5* 2.2* 2.2*  < > = values in this interval not displayed.  Assessment and Plan: 1. S/p bilateral chest tube drain placements 10/19 - [place] -cont both drains in place for now given output.  His breathing is improved -plans for TCTS -will follow   Electronically Signed: Bita Cartwright E 12/01/2015, 10:57 AM   I spent a total of 15 Minutes at the the patient's bedside AND on the patient's hospital floor or unit, greater than 50% of which was counseling/coordinating care for bilateral pleural effusions

## 2015-12-01 NOTE — Progress Notes (Signed)
[facility] Progress Note   Subjective: breathing much better after CT's placed.  BP's high.  At dry wt .   Vitals:   12/01/15 0900 12/01/15 0917 12/01/15 0930 12/01/15 0943  BP: (!) 146/80 (!) 150/82 (!) 148/88 (!) 159/85  Pulse: (!) 103 98 99 97  Resp:      Temp:      TempSrc:      SpO2:      Weight:      Height:        Inpatient medications: . vancomycin      . calcitRIOL  1.25 mcg Oral Q M,W,F-1800  . ceFEPime (MAXIPIME) IV  1 g Intravenous Q24H  . guaiFENesin  600 mg Oral BID  . heparin  5,000 Units Subcutaneous Q8H  . insulin aspart  0-5 Units Subcutaneous QHS  . insulin aspart  0-9 Units Subcutaneous TID WC  . insulin aspart  4 Units Subcutaneous TID WC  . insulin glargine  10 Units Subcutaneous QHS  . sodium chloride flush  3 mL Intravenous Q12H  . vancomycin  1,000 mg Intravenous Q M,W,F-HD     sodium chloride, acetaminophen, fentaNYL, HYDROcodone-acetaminophen, midazolam, ondansetron, sodium chloride flush  Exam: General: WDWN, no distress Neck: no JVD Lungs: faint bibasilar rales Heart: RRR no MRG Abdomen: Soft, non-tender, non-distended with normoactive bowel sounds Lower extremities: 1+ LE edema  Neuro: Alert and oriented X 3. Moves all extremities spontaneously. Psych:  Responds to questions appropriately with a normal affect. HD Access: RUE AVF + thrill/bruit   Dialysis Orders:  MWF at [facility]   2/2 bath  AVF RUA   Hep 5000 - Calcitriol 1.12mcg PO q HD - No venofer yet, but tsat 12% on 10/11 - No ESA currently.  Assessment: 1.  Dyspnea/ bilat pleural effusions: in setting recent admit Strep pneumo infection /bacteremia/ presumed PNA.  Cultures of fluid are pending, chemistries/ cell count appear not have been sent yet. Not clear if empyema/ exudate/ vol overload, etc, yet. Dyspnea better. 2. ESRD - continue MWF schedule for now. No heparin with HD in case needs procedure 3.  HTN/ vol - bp's up, try to  pull volume today 4.  Anemia: Hgb 10.8. No ESA for now, likely resume soon. No IV iron for now since active infection. 5.  Metabolic bone disease: Ca 7.8. Continue binders/VDRA. Will check Phos. 6.  Nutrition:  Alb 2.5; start Nepro supplements. 7. Type I DM: Per primary.  Plan - HD today, pull vol as tolerated   [name] MD [place] pager [phone]   12/01/2015, 9:54 AM    Recent Labs Lab 11/29/15 1258 11/30/15 0237 12/01/15 0713  NA 131* 131* 127*  K 5.0 4.7 4.6  CL 91* 91* 90*  CO2 26 27 21*  GLUCOSE 239* 381* 345*  BUN 44* 26* 40*  CREATININE 12.16* 8.98* 12.10*  CALCIUM 8.5* 8.2* 8.6*  PHOS 6.3*  --  8.7*    Recent Labs Lab 11/28/15 1618 11/29/15 1258 12/01/15 0713  AST 15  --   --   ALT 14*  --   --   ALKPHOS 119  --   --   BILITOT 1.3*  --   --   PROT 7.9  --   --   ALBUMIN 2.5* 2.2* 2.2*    Recent Labs Lab 11/28/15 1105 11/28/15 1618 11/29/15 1258 11/30/15 0237 12/01/15 0712  WBC 13.7* 12.9* 11.7* 11.0* 9.9  NEUTROABS 9.3* 8.6*  --   --   --  HGB 10.7* 10.8* 10.2* 10.3* 10.1*  HCT 32.2* 32.0* 30.7* 30.4* 29.6*  MCV 96.7 96.7 93.3 93.3 92.8  PLT 589* 538* 556* 535* 518*   Iron/TIBC/Ferritin/ %Sat No results found for: IRON, TIBC, FERRITIN, IRONPCTSAT

## 2015-12-01 NOTE — Progress Notes (Signed)
PROGRESS NOTE  RUSH SALCE FXT:[id number] DOB: [birth year] DOA: 11/28/2015 PCP: Mickie Hillier, MD   LOS: 3 days   Brief Narrative: 45 y.o. Male with a history of ESRD on hemodialysis, type 1 diabetes, and HTN who presented to the ED under instruction of PCP for low-grade fever, nonproductive cough, dyspnea, and abnormal chest XR findings. Patient was admitted to [facility] in the first week of October for pneumonia. Growth showed strep pneumo from blood cultures. He was discharged on vanomycin with dialysis for 4 weeks, although the patient reports only receiving 4 treatments. Chest x-ray on 11/15/15 showed loculated right pleural effusion concerning for empyema. He felt fine after discharge for about 1 week. He then developed nausea, vomiting, dyspnea, and a temp of 99. He also developed right lateral mid-back pain. Patient underwent CT guided pigtail catheter placement bilaterally 11/30/15. Gram stain showed WBC present, both PMN and mononuclear no organisms seen cytospin smear. Patient complains of headache and mild nausea. Patient denies chest pain, palpitations, edema, pleuritic chest pain, and vomiting.   Assessment & Plan: Principal Problem:   Loculated pleural effusion Active Problems:   Diabetes mellitus type 1 (HCC)   Essential hypertension, benign   Chronic diastolic heart failure (HCC)   Hyponatremia   ESRD (end stage renal disease) on dialysis (HCC)   Bilateral pleural effusion   HCAP (healthcare-associated pneumonia)    HCAP (healthcare-associated pneumonia) / Loculated pleural effusion - Chest x-ray on 11/15/15 showed loculated right pleural effusion concerning for empyema.  - Patient underwent CT guided bilateral pigtail catheter placement 10/19 - Continue vancomycin and cefepime  - Gram stain showed WBC present, both PMN and mononuclear no organisms seen cytospin smear - Aspirate fluid culture results pending, unfortunately rest of labs cancelled 10/19  inappropriately by laboratory division, repeat labs ordered however active, likely not sent  Diabetes mellitus type 1  - A1c 9.7% in 2015; serum glucose is 322 on admission.  - continue Lantus / SSI.  - elevated fasting CBG this morning, increase Lantus to 15 U.   Essential Hypertension - home BP meds on hold since admission, resume Coreg today at lower dose, continue to hold Norvasc  Hyponatremia  - HD today  ESRD (end stage renal disease) on dialysis [facility])  - consulted nephrology  Normocytic anemia - in the setting of ESRD   DVT prophylaxis: heparin Code Status: Full Family Communication: no family @ bedside Disposition Plan: TBD  Consultants:   Cardiothoracic surgery  Interventional radiology  Procedures:   HD: yes   CT guided pigtail catheter placement bilaterally 10/19  Antimicrobials:  Cefepime 10/17 >>  Vancomycin 10/17 >>  Subjective: Patient complains of headache and mild nausea. Patient denies chest pain, palpitations, edema, pleuritic chest pain, and vomiting.   Objective: Vitals:   12/01/15 1015 12/01/15 1030 12/01/15 1100 12/01/15 1115  BP: (!) 152/80 133/65 139/85 120/79  Pulse: (!) 102 (!) 105 (!) 108 (!) 108  Resp:      Temp:      TempSrc:      SpO2:      Weight:      Height:        Intake/Output Summary (Last 24 hours) at 12/01/15 1125 Last data filed at 12/01/15 0558  Gross per 24 hour  Intake              460 ml  Output             1070 ml  Net             -  610 ml   Filed Weights   11/30/15 0453 12/01/15 0419 12/01/15 0651  Weight: 99.2 kg (218 lb 9.6 oz) 99.9 kg (220 lb 4.8 oz) 100.2 kg (220 lb 14.4 oz)    Examination: Constitutional: NAD Vitals:   12/01/15 1015 12/01/15 1030 12/01/15 1100 12/01/15 1115  BP: (!) 152/80 133/65 139/85 120/79  Pulse: (!) 102 (!) 105 (!) 108 (!) 108  Resp:      Temp:      TempSrc:      SpO2:      Weight:      Height:       Eyes: lids and conjunctivae normal ENMT: Mucous membranes  are moist.  Respiratory: clear to auscultation bilaterally, no wheezing, no crackles. Normal respiratory effort.  Cardiovascular: Regular rate and rhythm, no murmurs / rubs / gallops. No LE edema. Skin: no rashes, lesions, ulcers.   Data Reviewed: I have personally reviewed following labs and imaging studies  CBC:  Recent Labs Lab 11/28/15 1105 11/28/15 1618 11/29/15 1258 11/30/15 0237 12/01/15 0712  WBC 13.7* 12.9* 11.7* 11.0* 9.9  NEUTROABS 9.3* 8.6*  --   --   --   HGB 10.7* 10.8* 10.2* 10.3* 10.1*  HCT 32.2* 32.0* 30.7* 30.4* 29.6*  MCV 96.7 96.7 93.3 93.3 92.8  PLT 589* 538* 556* 535* 213*   Basic Metabolic Panel:  Recent Labs Lab 11/28/15 1618 11/29/15 1258 11/30/15 0237 12/01/15 0713  NA 131* 131* 131* 127*  K 4.6 5.0 4.7 4.6  CL 89* 91* 91* 90*  CO2 27 26 27  21*  GLUCOSE 322* 239* 381* 345*  BUN 41* 44* 26* 40*  CREATININE 10.49* 12.16* 8.98* 12.10*  CALCIUM 7.8* 8.5* 8.2* 8.6*  PHOS  --  6.3*  --  8.7*   GFR: Estimated Creatinine Clearance: 9.1 mL/min (by C-G formula based on SCr of 12.1 mg/dL (H)). Liver Function Tests:  Recent Labs Lab 11/28/15 1618 11/29/15 1258 12/01/15 0713  AST 15  --   --   ALT 14*  --   --   ALKPHOS 119  --   --   BILITOT 1.3*  --   --   PROT 7.9  --   --   ALBUMIN 2.5* 2.2* 2.2*   No results for input(s): LIPASE, AMYLASE in the last 168 hours. No results for input(s): AMMONIA in the last 168 hours. Coagulation Profile:  Recent Labs Lab 11/30/15 0237  INR 1.16   Cardiac Enzymes: No results for input(s): CKTOTAL, CKMB, CKMBINDEX, TROPONINI in the last 168 hours. BNP (last 3 results) No results for input(s): PROBNP in the last 8760 hours. HbA1C:  Recent Labs  11/28/15 1618  HGBA1C 9.8*   CBG:  Recent Labs Lab 11/30/15 0558 11/30/15 1220 11/30/15 1625 11/30/15 2056 12/01/15 0551  GLUCAP 398* 269* 334* 241* 331*   Lipid Profile: No results for input(s): CHOL, HDL, LDLCALC, TRIG, CHOLHDL, LDLDIRECT  in the last 72 hours. Thyroid Function Tests: No results for input(s): TSH, T4TOTAL, FREET4, T3FREE, THYROIDAB in the last 72 hours. Anemia Panel: No results for input(s): VITAMINB12, FOLATE, FERRITIN, TIBC, IRON, RETICCTPCT in the last 72 hours.  Invalid input(s): PROCALCITONIN, LACTICIDVEN  Recent Results (from the past 240 hour(s))  Culture, blood (single)     Status: None (Preliminary result)   Collection Time: 11/28/15 11:05 AM  Result Value Ref Range Status   Specimen Description BLOOD LEFT HAND  Final   Special Requests   Final    BOTTLES DRAWN AEROBIC AND ANAEROBIC AEB=7CC ANA=6CC  Culture NO GROWTH 2 DAYS  Final   Report Status PENDING  Incomplete  Blood culture (routine x 2)     Status: None (Preliminary result)   Collection Time: 11/28/15  4:18 PM  Result Value Ref Range Status   Specimen Description BLOOD RIGHT FOREARM  Final   Special Requests   Final    BOTTLES DRAWN AEROBIC AND ANAEROBIC AEB=8CC ANA=6CC   Culture NO GROWTH 2 DAYS  Final   Report Status PENDING  Incomplete  Blood culture (routine x 2)     Status: None (Preliminary result)   Collection Time: 11/28/15  4:24 PM  Result Value Ref Range Status   Specimen Description BLOOD LEFT HAND  Final   Special Requests   Final    BOTTLES DRAWN AEROBIC AND ANAEROBIC AEB=6CC ANA=5CC   Culture NO GROWTH 2 DAYS  Final   Report Status PENDING  Incomplete  Culture, body fluid-bottle     Status: None (Preliminary result)   Collection Time: 11/30/15  2:25 PM  Result Value Ref Range Status   Specimen Description FLUID RIGHT PLEURAL  Final   Special Requests BOTTLES DRAWN AEROBIC AND ANAEROBIC 10CC  Final   Culture NO GROWTH < 24 HOURS  Final   Report Status PENDING  Incomplete  Gram stain     Status: None   Collection Time: 11/30/15  2:25 PM  Result Value Ref Range Status   Specimen Description FLUID RIGHT PLEURAL  Final   Special Requests NONE  Final   Gram Stain   Final    WBC PRESENT,BOTH PMN AND MONONUCLEAR NO  ORGANISMS SEEN CYTOSPIN SMEAR    Report Status 11/30/2015 FINAL  Final  Gram stain     Status: None   Collection Time: 11/30/15  2:26 PM  Result Value Ref Range Status   Specimen Description FLUID LEFT PLEURAL  Final   Special Requests NONE  Final   Gram Stain   Final    WBC PRESENT,BOTH PMN AND MONONUCLEAR NO ORGANISMS SEEN CYTOSPIN SMEAR    Report Status 11/30/2015 FINAL  Final      Radiology Studies: Ct Perc Pleural Drain W/indwell Cath W/img Guide  Result Date: 11/30/2015 INDICATION: History of hospital acquired pneumonia, now with bilateral pleural effusions. Patient has been evaluated by the cardiothoracic surgery service and deemed a poor candidate for VATS procedure. As such, request made for placement of bilateral percutaneous drainage catheter placements. EXAM: 1. CT-GUIDED LEFT-SIDED CHEST TUBE DRAINAGE CATHETER PLACEMENT 2. CT-GUIDED RIGHT-SIDED CHEST TUBE DRAINAGE CATHETER PLACEMENT COMPARISON:  Chest CT - 11/28/2015 MEDICATIONS: The patient is currently admitted to the hospital and receiving intravenous antibiotics. The antibiotics were administered within an appropriate time frame prior to the initiation of the procedure. ANESTHESIA/SEDATION: Moderate (conscious) sedation was employed during this procedure. A total of Versed 1 mg and Fentanyl 50 mcg was administered intravenously. Moderate Sedation Time: 15 minutes. The patient's level of consciousness and vital signs were monitored continuously by radiology nursing throughout the procedure under my direct supervision. CONTRAST:  None COMPLICATIONS: None immediate. PROCEDURE: Informed written consent was obtained from the patient after a discussion of the risks, benefits and alternatives to treatment. The patient was placed supine on the CT gantry and a pre procedural CT was performed re-demonstrating the known partially loculated bilateral pleural effusions. The procedure was planned. A timeout was performed prior to the  initiation of the procedure. The skin overlying the inferior lateral aspects of the bilateral lower thoraces were prepped and draped in the usual sterile  fashion. Beginning with the left-sided pleural effusion, the overlying soft tissues were anesthetized with 1% lidocaine with epinephrine. Appropriate trajectory was planned with the use of a 22 gauge spinal needle. An 18 gauge trocar needle was advanced into the abscess/fluid collection and a short Amplatz super stiff wire was coiled within the collection. Appropriate positioning was confirmed with a limited CT scan. The tract was serially dilated allowing placement of a 12 [place] all-purpose drainage catheter. Appropriate positioning was confirmed with a limited postprocedural CT scan. Approximately 150 cc of serous, amber colored fluid was aspirated following placement of the left-sided chest tube. Attention was now paid towards placement of the right-sided chest tube. The overlying soft tissues were anesthetized with 1% lidocaine with epinephrine. Appropriate trajectory was planned with the use of a 22 gauge spinal needle. An 18 gauge trocar needle was advanced into the abscess/fluid collection and a short Amplatz super stiff wire was coiled within the collection. Appropriate positioning was confirmed with a limited CT scan. The tract was serially dilated allowing placement of a 12 [place] all-purpose drainage catheter. Appropriate positioning was confirmed with a limited postprocedural CT scan. Approximately 700 cc of serous, amber colored fluid was aspirated following placement of the right-sided chest tube. The bilateral chest tubes connected to pleural vac devices and sutured in place. A dressing was placed. The patient tolerated the procedure well without immediate post procedural complication. IMPRESSION: 1. Successful CT guided placement of a 12 [place] all purpose drain catheter into the left pleural space with aspiration of 150 mL of serous, amber color  fluid. 2. Successful CT-guided placement of a 11 [place] all-purpose drainage catheter into the right pleural space with aspiration of approximately 700 cc of serous, amber colored fluid. 3. Separate samples from each chest tube were sent to the laboratory as requested by the ordering clinical team. Electronically Signed   By: Sandi Mariscal M.D.   On: 11/30/2015 14:49   Ct Perc Pleural Drain W/indwell Cath W/img Guide  Result Date: 11/30/2015 INDICATION: History of hospital acquired pneumonia, now with bilateral pleural effusions. Patient has been evaluated by the cardiothoracic surgery service and deemed a poor candidate for VATS procedure. As such, request made for placement of bilateral percutaneous drainage catheter placements. EXAM: 1. CT-GUIDED LEFT-SIDED CHEST TUBE DRAINAGE CATHETER PLACEMENT 2. CT-GUIDED RIGHT-SIDED CHEST TUBE DRAINAGE CATHETER PLACEMENT COMPARISON:  Chest CT - 11/28/2015 MEDICATIONS: The patient is currently admitted to the hospital and receiving intravenous antibiotics. The antibiotics were administered within an appropriate time frame prior to the initiation of the procedure. ANESTHESIA/SEDATION: Moderate (conscious) sedation was employed during this procedure. A total of Versed 1 mg and Fentanyl 50 mcg was administered intravenously. Moderate Sedation Time: 15 minutes. The patient's level of consciousness and vital signs were monitored continuously by radiology nursing throughout the procedure under my direct supervision. CONTRAST:  None COMPLICATIONS: None immediate. PROCEDURE: Informed written consent was obtained from the patient after a discussion of the risks, benefits and alternatives to treatment. The patient was placed supine on the CT gantry and a pre procedural CT was performed re-demonstrating the known partially loculated bilateral pleural effusions. The procedure was planned. A timeout was performed prior to the initiation of the procedure. The skin overlying the inferior  lateral aspects of the bilateral lower thoraces were prepped and draped in the usual sterile fashion. Beginning with the left-sided pleural effusion, the overlying soft tissues were anesthetized with 1% lidocaine with epinephrine. Appropriate trajectory was planned with the use of a 22 gauge spinal  needle. An 18 gauge trocar needle was advanced into the abscess/fluid collection and a short Amplatz super stiff wire was coiled within the collection. Appropriate positioning was confirmed with a limited CT scan. The tract was serially dilated allowing placement of a 12 [place] all-purpose drainage catheter. Appropriate positioning was confirmed with a limited postprocedural CT scan. Approximately 150 cc of serous, amber colored fluid was aspirated following placement of the left-sided chest tube. Attention was now paid towards placement of the right-sided chest tube. The overlying soft tissues were anesthetized with 1% lidocaine with epinephrine. Appropriate trajectory was planned with the use of a 22 gauge spinal needle. An 18 gauge trocar needle was advanced into the abscess/fluid collection and a short Amplatz super stiff wire was coiled within the collection. Appropriate positioning was confirmed with a limited CT scan. The tract was serially dilated allowing placement of a 12 [place] all-purpose drainage catheter. Appropriate positioning was confirmed with a limited postprocedural CT scan. Approximately 700 cc of serous, amber colored fluid was aspirated following placement of the right-sided chest tube. The bilateral chest tubes connected to pleural vac devices and sutured in place. A dressing was placed. The patient tolerated the procedure well without immediate post procedural complication. IMPRESSION: 1. Successful CT guided placement of a 12 [place] all purpose drain catheter into the left pleural space with aspiration of 150 mL of serous, amber color fluid. 2. Successful CT-guided placement of a 41 French  all-purpose drainage catheter into the right pleural space with aspiration of approximately 700 cc of serous, amber colored fluid. 3. Separate samples from each chest tube were sent to the laboratory as requested by the ordering clinical team. Electronically Signed   By: Sandi Mariscal M.D.   On: 11/30/2015 14:49     Scheduled Meds: . calcitRIOL  1.25 mcg Oral Q M,W,F-1800  . ceFEPime (MAXIPIME) IV  1 g Intravenous Q24H  . guaiFENesin  600 mg Oral BID  . heparin  5,000 Units Subcutaneous Q8H  . insulin aspart  0-5 Units Subcutaneous QHS  . insulin aspart  0-9 Units Subcutaneous TID WC  . insulin aspart  4 Units Subcutaneous TID WC  . insulin glargine  10 Units Subcutaneous QHS  . sodium chloride flush  3 mL Intravenous Q12H  . vancomycin  1,000 mg Intravenous Q M,W,F-HD    Marzetta Board, MD, PhD [facility] Pager [phone]  If 7PM-7AM, please contact night-coverage [email or web address] Password [email or web address] 12/01/2015, 11:25 AM

## 2015-12-01 NOTE — Progress Notes (Signed)
Pt returned to unit. CBG 143. Pt caught up on medications. Pt denies other needs at this time. CCMD notified. Call light within reach. Will continue to monitor.  Fritz Pickerel, RN

## 2015-12-02 ENCOUNTER — Inpatient Hospital Stay (HOSPITAL_COMMUNITY): Payer: Medicare Other

## 2015-12-02 LAB — BASIC METABOLIC PANEL
ANION GAP: 13 (ref 5–15)
BUN: 25 mg/dL — AB (ref 6–20)
CALCIUM: 8.3 mg/dL — AB (ref 8.9–10.3)
CO2: 27 mmol/L (ref 22–32)
Chloride: 90 mmol/L — ABNORMAL LOW (ref 101–111)
Creatinine, Ser: 8.19 mg/dL — ABNORMAL HIGH (ref 0.61–1.24)
GFR calc Af Amer: 8 mL/min — ABNORMAL LOW (ref >60)
GFR calc non Af Amer: 7 mL/min — ABNORMAL LOW (ref >60)
GLUCOSE: 355 mg/dL — AB (ref 65–99)
Potassium: 4.5 mmol/L (ref 3.5–5.1)
Sodium: 130 mmol/L — ABNORMAL LOW (ref 135–145)

## 2015-12-02 LAB — GLUCOSE, CAPILLARY
GLUCOSE-CAPILLARY: 266 mg/dL — AB (ref 65–99)
Glucose-Capillary: 382 mg/dL — ABNORMAL HIGH (ref 65–99)
Glucose-Capillary: 187 mg/dL — ABNORMAL HIGH (ref 65–99)

## 2015-12-02 LAB — LACTATE DEHYDROGENASE: LDH: 149 U/L (ref 98–192)

## 2015-12-02 MED ORDER — VANCOMYCIN HCL 500 MG IV SOLR
500.0000 mg | Freq: Once | INTRAVENOUS | Status: AC
  Administered 2015-12-02: 500 mg via INTRAVENOUS
  Filled 2015-12-02: qty 500

## 2015-12-02 MED ORDER — HEPARIN SODIUM (PORCINE) 1000 UNIT/ML DIALYSIS: 4000.0000 [IU] | Freq: Once | INTRAMUSCULAR | Status: DC

## 2015-12-02 MED ORDER — SODIUM CHLORIDE 0.9 % IV SOLN: 100.0000 mL | INTRAVENOUS | Status: DC | PRN

## 2015-12-02 MED ORDER — HEPARIN SODIUM (PORCINE) 1000 UNIT/ML DIALYSIS: 1000.0000 [IU] | INTRAMUSCULAR | Status: DC | PRN

## 2015-12-02 MED ORDER — INSULIN GLARGINE 100 UNIT/ML ~~LOC~~ SOLN
20.0000 [IU] | Freq: Every day | SUBCUTANEOUS | Status: DC
  Administered 2015-12-02: 20 [IU] via SUBCUTANEOUS
  Filled 2015-12-02 (×2): qty 0.2

## 2015-12-02 MED ORDER — PENTAFLUOROPROP-TETRAFLUOROETH EX AERO: 1.0000 "application " | INHALATION_SPRAY | CUTANEOUS | Status: DC | PRN

## 2015-12-02 MED ORDER — ALTEPLASE 2 MG IJ SOLR: 2.0000 mg | Freq: Once | INTRAMUSCULAR | Status: DC | PRN

## 2015-12-02 MED ORDER — LIDOCAINE HCL (PF) 1 % IJ SOLN: 5.0000 mL | INTRAMUSCULAR | Status: DC | PRN

## 2015-12-02 MED ORDER — LIDOCAINE-PRILOCAINE 2.5-2.5 % EX CREA: 1.0000 "application " | TOPICAL_CREAM | CUTANEOUS | Status: DC | PRN

## 2015-12-02 MED ORDER — INSULIN ASPART 100 UNIT/ML ~~LOC~~ SOLN
5.0000 [IU] | Freq: Three times a day (TID) | SUBCUTANEOUS | Status: DC
  Administered 2015-12-02 – 2015-12-04 (×6): 5 [IU] via SUBCUTANEOUS

## 2015-12-02 NOTE — Progress Notes (Signed)
[facility] Progress Note   Subjective: no c/o's.    Vitals:   12/02/15 1330 12/02/15 1400 12/02/15 1430 12/02/15 1500  BP: (!) 141/69 127/62 138/69   Pulse: 93 100 96 72  Resp:      Temp:      TempSrc:      SpO2:      Weight:      Height:        Inpatient medications: . calcitRIOL  1.25 mcg Oral Q M,W,F-1800  . carvedilol  12.5 mg Oral BID WC  . ceFEPime (MAXIPIME) IV  1 g Intravenous Q24H  . guaiFENesin  600 mg Oral BID  . heparin  5,000 Units Subcutaneous Q8H  . insulin aspart  0-5 Units Subcutaneous QHS  . insulin aspart  0-9 Units Subcutaneous TID WC  . insulin aspart  5 Units Subcutaneous TID WC  . insulin glargine  20 Units Subcutaneous QHS  . sodium chloride flush  3 mL Intravenous Q12H  . vancomycin  1,000 mg Intravenous Q M,W,F-HD     sodium chloride, acetaminophen, docusate sodium, fentaNYL, HYDROcodone-acetaminophen, midazolam, ondansetron, sodium chloride flush  Exam: General: WDWN, no distress Neck: no JVD Lungs: faint bibasilar rales Heart: RRR no MRG Abdomen: Soft, non-tender, non-distended with normoactive bowel sounds Lower extremities: 1+ LE edema  Neuro: Alert and oriented X 3. Moves all extremities spontaneously. Psych:  Responds to questions appropriately with a normal affect. HD Access: RUE AVF + thrill/bruit   Dialysis Orders:  MWF at [facility]   2/2 bath  AVF RUA   Hep 5000 - Calcitriol 1.18mcg PO q HD - No venofer yet, but tsat 12% on 10/11 - No ESA currently.  Assessment: 1.  Dyspnea/ bilat pleural effusions: in setting recent admit Strep pneumo infection /bacteremia/ presumed PNA.  Cell counts low , pleural fluid chemistry back, awaiting serum LDH/ total prot.  Not clear if empyema/ exudate/ vol overload, etc, yet. Dyspnea better.  Is on abx. 2. ESRD - continue MWF schedule for now. No heparin with HD in case needs procedure 3. Vol excess/ pulm edema - improving clinically, had bonafide  pulm edema on at least one CXR since admission.  4.  Anemia: Hgb 10.8. No ESA for now, likely resume soon. No IV iron for now since active infection. 5.  Metabolic bone disease: Ca 7.8. Continue binders/VDRA. Will check Phos. 6.  Nutrition:  Alb 2.5; start Nepro supplements. 7. Type I DM: Per primary.  Plan - extra HD today, get vol down further as tolerated.    [name] MD [facility] pager [phone]   12/02/2015, 6:29 PM    Recent Labs Lab 11/29/15 1258 11/30/15 0237 12/01/15 0713 12/02/15 0423  NA 131* 131* 127* 130*  K 5.0 4.7 4.6 4.5  CL 91* 91* 90* 90*  CO2 26 27 21* 27  GLUCOSE 239* 381* 345* 355*  BUN 44* 26* 40* 25*  CREATININE 12.16* 8.98* 12.10* 8.19*  CALCIUM 8.5* 8.2* 8.6* 8.3*  PHOS 6.3*  --  8.7*  --     Recent Labs Lab 11/28/15 1618 11/29/15 1258 12/01/15 0713  AST 15  --   --   ALT 14*  --   --   ALKPHOS 119  --   --   BILITOT 1.3*  --   --   PROT 7.9  --   --   ALBUMIN 2.5* 2.2* 2.2*    Recent Labs Lab 11/28/15 1105 11/28/15 1618 11/29/15  1258 11/30/15 0237 12/01/15 0712  WBC 13.7* 12.9* 11.7* 11.0* 9.9  NEUTROABS 9.3* 8.6*  --   --   --   HGB 10.7* 10.8* 10.2* 10.3* 10.1*  HCT 32.2* 32.0* 30.7* 30.4* 29.6*  MCV 96.7 96.7 93.3 93.3 92.8  PLT 589* 538* 556* 535* 518*   Iron/TIBC/Ferritin/ %Sat No results found for: IRON, TIBC, FERRITIN, IRONPCTSAT

## 2015-12-02 NOTE — Progress Notes (Signed)
PROGRESS NOTE  Lance [name] HEN:[id number] DOB: May 07, 1970 DOA: 11/28/2015 PCP: Mickie Hillier, MD   LOS: 4 days   Brief Narrative: 45 y.o. Male with a history of ESRD on hemodialysis, type 1 diabetes, and HTN who presented to the ED under instruction of PCP for low-grade fever, nonproductive cough, dyspnea, and abnormal chest XR findings. Patient was admitted to [facility] in the first week of October for pneumonia. Growth showed strep pneumo from blood cultures. He was discharged on vanomycin with dialysis for 4 weeks, although the patient reports only receiving 4 treatments. Chest x-ray on 11/15/15 showed loculated right pleural effusion concerning for empyema. He felt fine after discharge for about 1 week. He then developed nausea, vomiting, dyspnea, and a temp of 99. He also developed right lateral mid-back pain. Patient underwent CT guided pigtail catheter placement bilaterally 11/30/15. Gram stain showed WBC present, both PMN and mononuclear no organisms seen cytospin smear. Patient complains of headache and mild nausea. Patient denies chest pain, palpitations, edema, pleuritic chest pain, and vomiting.   Assessment & Plan: Principal Problem:   Loculated pleural effusion Active Problems:   Diabetes mellitus type 1 (HCC)   Essential hypertension, benign   Chronic diastolic heart failure (HCC)   Hyponatremia   ESRD (end stage renal disease) on dialysis (HCC)   Bilateral pleural effusion   HCAP (healthcare-associated pneumonia)    HCAP (healthcare-associated pneumonia) / Loculated pleural effusion - Chest x-ray on 11/15/15 showed loculated right pleural effusion concerning for empyema.  - Patient underwent CT guided bilateral pigtail catheter placement 10/19 - Continue vancomycin and cefepime  - Gram stain showed WBC present, both PMN and mononuclear no organisms seen cytospin smear - Aspirate fluid culture results pending, negative so far, fluid studies not overt for empyema -  tube management per TCV / IR, possible removal tomorrow  Diabetes mellitus type 1  - A1c 9.7% in 2015; serum glucose is 322 on admission.  - continue Lantus / SSI.  - elevated fasting CBGs again, increase Lantus and scheduled mealtime today  Essential Hypertension - home BP meds on hold since admission, resumed Coreg at lower dose, continue to hold Norvasc  Hyponatremia  - fluid status managed with HD  ESRD (end stage renal disease) on dialysis [facility])  - consulted nephrology  Normocytic anemia - in the setting of ESRD   DVT prophylaxis: heparin Code Status: Full Family Communication: no family @ bedside Disposition Plan: TBD  Consultants:   Cardiothoracic surgery  Interventional radiology  Procedures:   HD: yes   CT guided pigtail catheter placement bilaterally 10/19  Antimicrobials:  Cefepime 10/17 >>  Vancomycin 10/17 >>  Subjective: No complaints today, asking about going home   Objective: Vitals:   12/01/15 1406 12/01/15 1540 12/01/15 2129 12/02/15 0430  BP: 138/82  (!) 145/79 137/67  Pulse: (!) 106 (!) 105 (!) 103 98  Resp: 18  18   Temp: 98.7 F (37.1 C)  98.9 F (37.2 C) 99 F (37.2 C)  TempSrc: Oral  Oral Oral  SpO2: 95%  96% 96%  Weight:    98.6 kg (217 lb 4.8 oz)  Height:        Intake/Output Summary (Last 24 hours) at 12/02/15 1110 Last data filed at 12/02/15 0545  Gross per 24 hour  Intake              650 ml  Output             2595 ml  Net            -  1945 ml   Filed Weights   12/01/15 0651 12/01/15 1144 12/02/15 0430  Weight: 100.2 kg (220 lb 14.4 oz) 97.3 kg (214 lb 8.1 oz) 98.6 kg (217 lb 4.8 oz)    Examination: Constitutional: NAD Vitals:   12/01/15 1406 12/01/15 1540 12/01/15 2129 12/02/15 0430  BP: 138/82  (!) 145/79 137/67  Pulse: (!) 106 (!) 105 (!) 103 98  Resp: 18  18   Temp: 98.7 F (37.1 C)  98.9 F (37.2 C) 99 F (37.2 C)  TempSrc: Oral  Oral Oral  SpO2: 95%  96% 96%  Weight:    98.6 kg (217 lb 4.8 oz)   Height:       Eyes: lids and conjunctivae normal ENMT: Mucous membranes are moist.  Respiratory: clear to auscultation bilaterally, no wheezing, no crackles. Normal respiratory effort.  Cardiovascular: Regular rate and rhythm, no murmurs / rubs / gallops. No LE edema. Skin: no rashes, lesions, ulcers.   Data Reviewed: I have personally reviewed following labs and imaging studies  CBC:  Recent Labs Lab 11/28/15 1105 11/28/15 1618 11/29/15 1258 11/30/15 0237 12/01/15 0712  WBC 13.7* 12.9* 11.7* 11.0* 9.9  NEUTROABS 9.3* 8.6*  --   --   --   HGB 10.7* 10.8* 10.2* 10.3* 10.1*  HCT 32.2* 32.0* 30.7* 30.4* 29.6*  MCV 96.7 96.7 93.3 93.3 92.8  PLT 589* 538* 556* 535* 970*   Basic Metabolic Panel:  Recent Labs Lab 11/28/15 1618 11/29/15 1258 11/30/15 0237 12/01/15 0713 12/02/15 0423  NA 131* 131* 131* 127* 130*  K 4.6 5.0 4.7 4.6 4.5  CL 89* 91* 91* 90* 90*  CO2 27 26 27  21* 27  GLUCOSE 322* 239* 381* 345* 355*  BUN 41* 44* 26* 40* 25*  CREATININE 10.49* 12.16* 8.98* 12.10* 8.19*  CALCIUM 7.8* 8.5* 8.2* 8.6* 8.3*  PHOS  --  6.3*  --  8.7*  --    GFR: Estimated Creatinine Clearance: 13.5 mL/min (by C-G formula based on SCr of 8.19 mg/dL (H)). Liver Function Tests:  Recent Labs Lab 11/28/15 1618 11/29/15 1258 12/01/15 0713  AST 15  --   --   ALT 14*  --   --   ALKPHOS 119  --   --   BILITOT 1.3*  --   --   PROT 7.9  --   --   ALBUMIN 2.5* 2.2* 2.2*   No results for input(s): LIPASE, AMYLASE in the last 168 hours. No results for input(s): AMMONIA in the last 168 hours. Coagulation Profile:  Recent Labs Lab 11/30/15 0237  INR 1.16   Cardiac Enzymes: No results for input(s): CKTOTAL, CKMB, CKMBINDEX, TROPONINI in the last 168 hours. BNP (last 3 results) No results for input(s): PROBNP in the last 8760 hours. HbA1C: No results for input(s): HGBA1C in the last 72 hours. CBG:  Recent Labs Lab 12/01/15 0551 12/01/15 1215 12/01/15 1651 12/01/15 2122  12/02/15 0631  GLUCAP 331* 143* 332* 325* 382*   Lipid Profile: No results for input(s): CHOL, HDL, LDLCALC, TRIG, CHOLHDL, LDLDIRECT in the last 72 hours. Thyroid Function Tests: No results for input(s): TSH, T4TOTAL, FREET4, T3FREE, THYROIDAB in the last 72 hours. Anemia Panel: No results for input(s): VITAMINB12, FOLATE, FERRITIN, TIBC, IRON, RETICCTPCT in the last 72 hours.  Invalid input(s): PROCALCITONIN, LACTICIDVEN  Recent Results (from the past 240 hour(s))  Culture, blood (single)     Status: None (Preliminary result)   Collection Time: 11/28/15 11:05 AM  Result Value Ref Range Status  Specimen Description BLOOD LEFT HAND  Final   Special Requests   Final    BOTTLES DRAWN AEROBIC AND ANAEROBIC AEB=7CC ANA=6CC   Culture NO GROWTH 4 DAYS  Final   Report Status PENDING  Incomplete  Blood culture (routine x 2)     Status: None (Preliminary result)   Collection Time: 11/28/15  4:18 PM  Result Value Ref Range Status   Specimen Description BLOOD RIGHT FOREARM  Final   Special Requests   Final    BOTTLES DRAWN AEROBIC AND ANAEROBIC AEB=8CC ANA=6CC   Culture NO GROWTH 4 DAYS  Final   Report Status PENDING  Incomplete  Blood culture (routine x 2)     Status: None (Preliminary result)   Collection Time: 11/28/15  4:24 PM  Result Value Ref Range Status   Specimen Description BLOOD LEFT HAND  Final   Special Requests   Final    BOTTLES DRAWN AEROBIC AND ANAEROBIC AEB=6CC ANA=5CC   Culture NO GROWTH 4 DAYS  Final   Report Status PENDING  Incomplete  Culture, body fluid-bottle     Status: None (Preliminary result)   Collection Time: 11/30/15  2:25 PM  Result Value Ref Range Status   Specimen Description FLUID RIGHT PLEURAL  Final   Special Requests BOTTLES DRAWN AEROBIC AND ANAEROBIC 10CC  Final   Culture NO GROWTH < 24 HOURS  Final   Report Status PENDING  Incomplete  Gram stain     Status: None   Collection Time: 11/30/15  2:25 PM  Result Value Ref Range Status    Specimen Description FLUID RIGHT PLEURAL  Final   Special Requests NONE  Final   Gram Stain   Final    WBC PRESENT,BOTH PMN AND MONONUCLEAR NO ORGANISMS SEEN CYTOSPIN SMEAR    Report Status 11/30/2015 FINAL  Final  Gram stain     Status: None   Collection Time: 11/30/15  2:26 PM  Result Value Ref Range Status   Specimen Description FLUID LEFT PLEURAL  Final   Special Requests NONE  Final   Gram Stain   Final    WBC PRESENT,BOTH PMN AND MONONUCLEAR NO ORGANISMS SEEN CYTOSPIN SMEAR    Report Status 11/30/2015 FINAL  Final      Radiology Studies: Dg Chest 2 View  Result Date: 12/02/2015 CLINICAL DATA:  Evaluate chest tube placement EXAM: CHEST  2 VIEW COMPARISON:  12/01/2015 FINDINGS: Bilateral chest tubes are again identified and stable. There is persistent fluid within the fissure on the right causing pseudotumor. Bibasilar atelectatic changes are noted. IMPRESSION: Bibasilar pleural drainage catheters. Persistent fluid is noted within the major fissure on the right. Bibasilar changes remain and are stable from the prior exam. No pneumothorax is noted. Electronically Signed   By: [name] M.D.   On: 12/02/2015 08:52   Dg Chest Port 1 View  Result Date: 12/01/2015 CLINICAL DATA:  Bilateral chest tube placement EXAM: PORTABLE CHEST 1 VIEW COMPARISON:  11/30/2015 FINDINGS: Cardiomegaly again noted. Bilateral basal pleural drainage catheter. Mild right basilar atelectasis or infiltrate. Trace left pleural effusion. No pulmonary edema. No pneumothorax. IMPRESSION: Bilateral basal pleural drainage catheter. Mild right basilar atelectasis or infiltrate. Trace left pleural effusion. No pulmonary edema. No pneumothorax. Electronically Signed   By: [name] M.D.   On: 12/01/2015 19:07   Ct Perc Pleural Drain W/indwell Cath W/img Guide  Result Date: 11/30/2015 INDICATION: History of hospital acquired pneumonia, now with bilateral pleural effusions. Patient has been evaluated by the  cardiothoracic surgery  service and deemed a poor candidate for VATS procedure. As such, request made for placement of bilateral percutaneous drainage catheter placements. EXAM: 1. CT-GUIDED LEFT-SIDED CHEST TUBE DRAINAGE CATHETER PLACEMENT 2. CT-GUIDED RIGHT-SIDED CHEST TUBE DRAINAGE CATHETER PLACEMENT COMPARISON:  Chest CT - 11/28/2015 MEDICATIONS: The patient is currently admitted to the hospital and receiving intravenous antibiotics. The antibiotics were administered within an appropriate time frame prior to the initiation of the procedure. ANESTHESIA/SEDATION: Moderate (conscious) sedation was employed during this procedure. A total of Versed 1 mg and Fentanyl 50 mcg was administered intravenously. Moderate Sedation Time: 15 minutes. The patient's level of consciousness and vital signs were monitored continuously by radiology nursing throughout the procedure under my direct supervision. CONTRAST:  None COMPLICATIONS: None immediate. PROCEDURE: Informed written consent was obtained from the patient after a discussion of the risks, benefits and alternatives to treatment. The patient was placed supine on the CT gantry and a pre procedural CT was performed re-demonstrating the known partially loculated bilateral pleural effusions. The procedure was planned. A timeout was performed prior to the initiation of the procedure. The skin overlying the inferior lateral aspects of the bilateral lower thoraces were prepped and draped in the usual sterile fashion. Beginning with the left-sided pleural effusion, the overlying soft tissues were anesthetized with 1% lidocaine with epinephrine. Appropriate trajectory was planned with the use of a 22 gauge spinal needle. An 18 gauge trocar needle was advanced into the abscess/fluid collection and a short Amplatz super stiff wire was coiled within the collection. Appropriate positioning was confirmed with a limited CT scan. The tract was serially dilated allowing placement of a 12  [place] all-purpose drainage catheter. Appropriate positioning was confirmed with a limited postprocedural CT scan. Approximately 150 cc of serous, amber colored fluid was aspirated following placement of the left-sided chest tube. Attention was now paid towards placement of the right-sided chest tube. The overlying soft tissues were anesthetized with 1% lidocaine with epinephrine. Appropriate trajectory was planned with the use of a 22 gauge spinal needle. An 18 gauge trocar needle was advanced into the abscess/fluid collection and a short Amplatz super stiff wire was coiled within the collection. Appropriate positioning was confirmed with a limited CT scan. The tract was serially dilated allowing placement of a 12 [place] all-purpose drainage catheter. Appropriate positioning was confirmed with a limited postprocedural CT scan. Approximately 700 cc of serous, amber colored fluid was aspirated following placement of the right-sided chest tube. The bilateral chest tubes connected to pleural vac devices and sutured in place. A dressing was placed. The patient tolerated the procedure well without immediate post procedural complication. IMPRESSION: 1. Successful CT guided placement of a 12 [place] all purpose drain catheter into the left pleural space with aspiration of 150 mL of serous, amber color fluid. 2. Successful CT-guided placement of a 78 [place] all-purpose drainage catheter into the right pleural space with aspiration of approximately 700 cc of serous, amber colored fluid. 3. Separate samples from each chest tube were sent to the laboratory as requested by the ordering clinical team. Electronically Signed   By: Sandi Mariscal M.D.   On: 11/30/2015 14:49   Ct Perc Pleural Drain W/indwell Cath W/img Guide  Result Date: 11/30/2015 INDICATION: History of hospital acquired pneumonia, now with bilateral pleural effusions. Patient has been evaluated by the cardiothoracic surgery service and deemed a poor candidate  for VATS procedure. As such, request made for placement of bilateral percutaneous drainage catheter placements. EXAM: 1. CT-GUIDED LEFT-SIDED CHEST TUBE DRAINAGE CATHETER PLACEMENT  2. CT-GUIDED RIGHT-SIDED CHEST TUBE DRAINAGE CATHETER PLACEMENT COMPARISON:  Chest CT - 11/28/2015 MEDICATIONS: The patient is currently admitted to the hospital and receiving intravenous antibiotics. The antibiotics were administered within an appropriate time frame prior to the initiation of the procedure. ANESTHESIA/SEDATION: Moderate (conscious) sedation was employed during this procedure. A total of Versed 1 mg and Fentanyl 50 mcg was administered intravenously. Moderate Sedation Time: 15 minutes. The patient's level of consciousness and vital signs were monitored continuously by radiology nursing throughout the procedure under my direct supervision. CONTRAST:  None COMPLICATIONS: None immediate. PROCEDURE: Informed written consent was obtained from the patient after a discussion of the risks, benefits and alternatives to treatment. The patient was placed supine on the CT gantry and a pre procedural CT was performed re-demonstrating the known partially loculated bilateral pleural effusions. The procedure was planned. A timeout was performed prior to the initiation of the procedure. The skin overlying the inferior lateral aspects of the bilateral lower thoraces were prepped and draped in the usual sterile fashion. Beginning with the left-sided pleural effusion, the overlying soft tissues were anesthetized with 1% lidocaine with epinephrine. Appropriate trajectory was planned with the use of a 22 gauge spinal needle. An 18 gauge trocar needle was advanced into the abscess/fluid collection and a short Amplatz super stiff wire was coiled within the collection. Appropriate positioning was confirmed with a limited CT scan. The tract was serially dilated allowing placement of a 12 [place] all-purpose drainage catheter. Appropriate  positioning was confirmed with a limited postprocedural CT scan. Approximately 150 cc of serous, amber colored fluid was aspirated following placement of the left-sided chest tube. Attention was now paid towards placement of the right-sided chest tube. The overlying soft tissues were anesthetized with 1% lidocaine with epinephrine. Appropriate trajectory was planned with the use of a 22 gauge spinal needle. An 18 gauge trocar needle was advanced into the abscess/fluid collection and a short Amplatz super stiff wire was coiled within the collection. Appropriate positioning was confirmed with a limited CT scan. The tract was serially dilated allowing placement of a 12 [place] all-purpose drainage catheter. Appropriate positioning was confirmed with a limited postprocedural CT scan. Approximately 700 cc of serous, amber colored fluid was aspirated following placement of the right-sided chest tube. The bilateral chest tubes connected to pleural vac devices and sutured in place. A dressing was placed. The patient tolerated the procedure well without immediate post procedural complication. IMPRESSION: 1. Successful CT guided placement of a 12 [place] all purpose drain catheter into the left pleural space with aspiration of 150 mL of serous, amber color fluid. 2. Successful CT-guided placement of a 19 French all-purpose drainage catheter into the right pleural space with aspiration of approximately 700 cc of serous, amber colored fluid. 3. Separate samples from each chest tube were sent to the laboratory as requested by the ordering clinical team. Electronically Signed   By: Sandi Mariscal M.D.   On: 11/30/2015 14:49   Scheduled Meds: . calcitRIOL  1.25 mcg Oral Q M,W,F-1800  . carvedilol  12.5 mg Oral BID WC  . ceFEPime (MAXIPIME) IV  1 g Intravenous Q24H  . guaiFENesin  600 mg Oral BID  . heparin  5,000 Units Subcutaneous Q8H  . insulin aspart  0-5 Units Subcutaneous QHS  . insulin aspart  0-9 Units Subcutaneous TID  WC  . insulin aspart  5 Units Subcutaneous TID WC  . insulin glargine  20 Units Subcutaneous QHS  . sodium chloride flush  3 mL  Intravenous Q12H  . vancomycin  1,000 mg Intravenous Q M,W,F-HD    Marzetta Board, MD, PhD [facility] Pager [phone]  If 7PM-7AM, please contact night-coverage [email or web address] Password [email or web address] 12/02/2015, 11:10 AM

## 2015-12-02 NOTE — Progress Notes (Addendum)
Pharmacy Antibiotic Note  Lance [name] is a 45 y.o. male admitted on 11/28/2015 with pneumonia.  Pharmacy was consulted for Vancomycin and Cefepime dosing.  Pt with ESRD - HD on M/W/F as o/p. Continuing on HD qMWF for now. Currently in HD today, for 3 hr ,BFR 400+ WBC 11.7, Tc 99.4, TM 99.6  recent admit Strep pneumo infection /bacteremia/ presumed PNA ESRD on HD qMWF , Cultures of fluid are pending  10/20 Friday  HD 4hr BFR 400 , Vanc 1g given post HD @10 :10 10/21 Sat , extra HD 3hr, BFR 400+ , already finished with HD, Pt has a peripheral IV -thus I will give extra vanc dose today.    Plan:  Give Vancomycin 500mg  IV x1 after HD today (Sat.) then  continue Vancomycin 1gm IV post HD- qMWF with next HD on Monday. F/u HD schedule Cefepime 1gm IV q24h Will f/u HD schedule and tolerance and micro data Vanc pre-HD level prn  Height: 6\' 3"  (190.5 cm) Weight: 218 lb 4.1 oz (99 kg) IBW/kg (Calculated) : 84.5  Temp (24hrs), Avg:98.7 F (37.1 C), Min:98.1 F (36.7 C), Max:99 F (37.2 C)   Recent Labs Lab 11/28/15 1105 11/28/15 1618 11/29/15 1258 11/30/15 0237 12/01/15 0712 12/01/15 0713 12/02/15 0423  WBC 13.7* 12.9* 11.7* 11.0* 9.9  --   --   CREATININE  --  10.49* 12.16* 8.98*  --  12.10* 8.19*  LATICACIDVEN  --  1.1  --   --   --   --   --   VANCORANDOM  --   --   --  27  --   --   --     Estimated Creatinine Clearance: 13.5 mL/min (by C-G formula based on SCr of 8.19 mg/dL (H)).    Allergies  Allergen Reactions  . Atorvastatin Other (See Comments)    Myalgias  . Minoxidil Other (See Comments)    Fluid retention   Antimicrobials this admission: Zosyn 10/17 x1 Vancomycin 10/17 >>  Cefepime 10/18 >>  Dose adjustments this admission:  Microbiology results: (12/02/2015) 10/17 BCx x2: NGTD 10/18 Sputum: pending  10/19 pleural Urine strep pneum +    Thank you for allowing pharmacy to be a part of this patient's care. Nicole Cella, RPh Clinical  Pharmacist Pager: [phone]  12/02/2015 3:25 PM

## 2015-12-02 NOTE — Progress Notes (Addendum)
      [address]       [place] [address]             [phone]      Subjective:  Mr. Godshall has no complaints.  States he is breathing much better.  Wants to go home.  Objective: Vital signs in last 24 hours: Temp:  [98.5 F (36.9 C)-99 F (37.2 C)] 99 F (37.2 C) (10/21 0430) Pulse Rate:  [97-108] 98 (10/21 0430) Cardiac Rhythm: Sinus tachycardia (10/20 1900) Resp:  [15-18] 18 (10/20 2129) BP: (110-159)/(65-90) 137/67 (10/21 0430) SpO2:  [93 %-96 %] 96 % (10/21 0430) Weight:  [214 lb 8.1 oz (97.3 kg)-217 lb 4.8 oz (98.6 kg)] 217 lb 4.8 oz (98.6 kg) (10/21 0430)  Intake/Output from previous day: 10/20 0701 - 10/21 0700 In: 650 [P.O.:600; IV Piggyback:50] Out: 2595 [Chest Tube:95]  General appearance: alert, cooperative and no distress Heart: regular rate and rhythm, + systolic murmur Lungs: diminished breath sounds bibasilar Abdomen: soft, non-tender; bowel sounds normal; no masses,  no organomegaly Wound: clean and dry  Lab Results:  Recent Labs  11/30/15 0237 12/01/15 0712  WBC 11.0* 9.9  HGB 10.3* 10.1*  HCT 30.4* 29.6*  PLT 535* 518*   BMET:  Recent Labs  12/01/15 0713 12/02/15 0423  NA 127* 130*  K 4.6 4.5  CL 90* 90*  CO2 21* 27  GLUCOSE 345* 355*  BUN 40* 25*  CREATININE 12.10* 8.19*  CALCIUM 8.6* 8.3*    PT/INR:  Recent Labs  11/30/15 0237  LABPROT 14.8  INR 1.16   ABG No results found for: PHART, HCO3, TCO2, ACIDBASEDEF, O2SAT CBG (last 3)   Recent Labs  12/01/15 1651 12/01/15 2122 12/02/15 0631  GLUCAP 332* 325* 382*    Assessment/Plan:  1. Bilateral pleural effusions, concerning for empyema-- S/P Placement of Pigtail catheters by IR... Both are draining... Will continue tubes today, if output remains low can remove tomorrow ( both pleurovac levels at 100) 2. ID- low grade temp at 99, fluid cultures are negative to date.. On Vancomycin 3. ESRD- Nephrology managing dialysis 4. DM- sugars elevated at >  300.. Will need better glucose monitoring 5. Dispo- patients pigtail catheters draining appropriately, leave in place today, repeat CXR in AM.. If output is low can start to remove tubes   LOS: 4 days    BARRETT, ERIN 12/02/2015  I have seen and examined the patient and agree with the assessment and plan as outlined.  Patient currently in HD.  Leave chest tubes in for now.  Rexene Alberts, MD 12/02/2015 1:52 PM

## 2015-12-03 ENCOUNTER — Inpatient Hospital Stay (HOSPITAL_COMMUNITY): Payer: Medicare Other

## 2015-12-03 LAB — GLUCOSE, CAPILLARY
GLUCOSE-CAPILLARY: 299 mg/dL — AB (ref 65–99)
GLUCOSE-CAPILLARY: 82 mg/dL (ref 65–99)
GLUCOSE-CAPILLARY: 132 mg/dL — AB (ref 65–99)
GLUCOSE-CAPILLARY: 220 mg/dL — AB (ref 65–99)

## 2015-12-03 LAB — CULTURE, BLOOD (ROUTINE X 2)
CULTURE: NO GROWTH
Culture: NO GROWTH

## 2015-12-03 LAB — BASIC METABOLIC PANEL
ANION GAP: 13 (ref 5–15)
BUN: 19 mg/dL (ref 6–20)
CALCIUM: 8.8 mg/dL — AB (ref 8.9–10.3)
CO2: 26 mmol/L (ref 22–32)
Chloride: 94 mmol/L — ABNORMAL LOW (ref 101–111)
Creatinine, Ser: 7.23 mg/dL — ABNORMAL HIGH (ref 0.61–1.24)
GFR, EST AFRICAN AMERICAN: 9 mL/min — AB (ref >60)
GFR, EST NON AFRICAN AMERICAN: 8 mL/min — AB (ref >60)
Glucose, Bld: 246 mg/dL — ABNORMAL HIGH (ref 65–99)
Potassium: 4.2 mmol/L (ref 3.5–5.1)
Sodium: 133 mmol/L — ABNORMAL LOW (ref 135–145)

## 2015-12-03 LAB — CBC
HCT: 31 % — ABNORMAL LOW (ref 39.0–52.0)
Hemoglobin: 10.4 g/dL — ABNORMAL LOW (ref 13.0–17.0)
MCH: 31 pg (ref 26.0–34.0)
MCHC: 33.5 g/dL (ref 30.0–36.0)
MCV: 92.5 fL (ref 78.0–100.0)
PLATELETS: 439 10*3/uL — AB (ref 150–400)
RBC: 3.35 MIL/uL — ABNORMAL LOW (ref 4.22–5.81)
RDW: 14.9 % (ref 11.5–15.5)
WBC: 9.1 10*3/uL (ref 4.0–10.5)

## 2015-12-03 LAB — CULTURE, BLOOD (SINGLE): Culture: NO GROWTH

## 2015-12-03 MED ORDER — SUCROFERRIC OXYHYDROXIDE 500 MG PO CHEW
1000.0000 mg | CHEWABLE_TABLET | Freq: Three times a day (TID) | ORAL | Status: DC
  Administered 2015-12-03 – 2015-12-04 (×2): 1000 mg via ORAL
  Filled 2015-12-03 (×6): qty 2

## 2015-12-03 MED ORDER — CINACALCET HCL 30 MG PO TABS
60.0000 mg | ORAL_TABLET | Freq: Every day | ORAL | Status: DC
  Administered 2015-12-03: 60 mg via ORAL
  Filled 2015-12-03: qty 2

## 2015-12-03 MED ORDER — RENA-VITE PO TABS
1.0000 | ORAL_TABLET | Freq: Every day | ORAL | Status: DC
Start: 2015-12-03 — End: 2015-12-05
  Administered 2015-12-03: 1 via ORAL
  Filled 2015-12-03 (×2): qty 1

## 2015-12-03 MED ORDER — INSULIN GLARGINE 100 UNIT/ML ~~LOC~~ SOLN
24.0000 [IU] | Freq: Every day | SUBCUTANEOUS | Status: DC
  Administered 2015-12-03 – 2015-12-04 (×2): 24 [IU] via SUBCUTANEOUS
  Filled 2015-12-03 (×3): qty 0.24

## 2015-12-03 MED ORDER — BOOST / RESOURCE BREEZE PO LIQD
1.0000 | Freq: Three times a day (TID) | ORAL | Status: DC
  Administered 2015-12-03 – 2015-12-04 (×4): 1 via ORAL

## 2015-12-03 NOTE — Progress Notes (Addendum)
      [address]       [place] [address]             [phone]      Subjective:  Lance [name] has no complaints this morning.  Wants to go home.   Objective: Vital signs in last 24 hours: Temp:  [98.1 F (36.7 C)-98.8 F (37.1 C)] 98.8 F (37.1 C) (10/22 0422) Pulse Rate:  [70-100] 95 (10/22 0422) Cardiac Rhythm: Sinus tachycardia (10/21 1900) Resp:  [15-20] 20 (10/22 0422) BP: (119-150)/(62-80) 135/70 (10/22 0422) SpO2:  [97 %-99 %] 98 % (10/22 0422) Weight:  [213 lb 10 oz (96.9 kg)-218 lb 4.1 oz (99 kg)] 214 lb (97.1 kg) (10/22 0422)  Intake/Output from previous day: 10/21 0701 - 10/22 0700 In: -  Out: 2021 [Chest Tube:21]  General appearance: alert, cooperative and no distress Heart: regular rate and rhythm Lungs: clear to auscultation bilaterally Abdomen: soft, non-tender; bowel sounds normal; no masses,  no organomegaly Extremities: extremities normal, atraumatic, no cyanosis or edema Wound: clean and dry  Lab Results:  Recent Labs  12/01/15 0712 12/03/15 0246  WBC 9.9 9.1  HGB 10.1* 10.4*  HCT 29.6* 31.0*  PLT 518* 439*   BMET:  Recent Labs  12/02/15 0423 12/03/15 0246  NA 130* 133*  K 4.5 4.2  CL 90* 94*  CO2 27 26  GLUCOSE 355* 246*  BUN 25* 19  CREATININE 8.19* 7.23*  CALCIUM 8.3* 8.8*    PT/INR: No results for input(s): LABPROT, INR in the last 72 hours. ABG No results found for: PHART, HCO3, TCO2, ACIDBASEDEF, O2SAT CBG (last 3)   Recent Labs  12/02/15 1623 12/02/15 2034 12/03/15 0612  GLUCAP 187* 266* 299*    Assessment/Plan:  1. Bilateral pleural effusions, improving... Right pigtail catheter drained 6 cc yesterday, the left pigtail catheter had 12 cc output yesterday- leave in place today 2. ID- fluid cultures remain negative, no longer febrile, on ABX per medicine 3. ESRD, dialysis managed per nephrology 4. DM- sugars remain elevated 5. Dispo- patient stable, minimal chest tube output continue for  now, care per primary   LOS: 5 days    BARRETT, ERIN 12/03/2015  I have seen and examined the patient and agree with the assessment and plan as outlined.  Pleural fluid cultures remain no growth so far and minimal output.  Leave in place today but possibly d/c drains tomorrow.  Rexene Alberts, MD 12/03/2015 12:13 PM

## 2015-12-03 NOTE — Progress Notes (Signed)
PROGRESS NOTE  Lance [name] PYY:[id number] DOB: March 06, 1970 DOA: 11/28/2015 PCP: Mickie Hillier, MD   LOS: 5 days   Brief Narrative: 45 y.o. Male with a history of ESRD on hemodialysis, type 1 diabetes, and HTN who presented to the ED under instruction of PCP for low-grade fever, nonproductive cough, dyspnea, and abnormal chest XR findings. Patient was admitted to [facility] in the first week of October for pneumonia. Growth showed strep pneumo from blood cultures. He was discharged on vanomycin with dialysis for 4 weeks, although the patient reports only receiving 4 treatments. Chest x-ray on 11/15/15 showed loculated right pleural effusion concerning for empyema. He felt fine after discharge for about 1 week. He then developed nausea, vomiting, dyspnea, and a temp of 99. He also developed right lateral mid-back pain. Patient underwent CT guided pigtail catheter placement bilaterally 11/30/15. Gram stain showed WBC present, both PMN and mononuclear no organisms seen cytospin smear. Patient complains of headache and mild nausea. Patient denies chest pain, palpitations, edema, pleuritic chest pain, and vomiting.   Assessment & Plan: Principal Problem:   Loculated pleural effusion Active Problems:   Diabetes mellitus type 1 (HCC)   Essential hypertension, benign   Chronic diastolic heart failure (HCC)   Hyponatremia   ESRD (end stage renal disease) on dialysis (HCC)   Bilateral pleural effusion   HCAP (healthcare-associated pneumonia)    HCAP (healthcare-associated pneumonia) / Loculated pleural effusion - Chest x-ray on 11/15/15 showed loculated right pleural effusion concerning for empyema.  - Patient underwent CT guided bilateral pigtail catheter placement 10/19 - Continue vancomycin and cefepime  - Gram stain showed WBC present, both PMN and mononuclear no organisms seen cytospin smear - Aspirate fluid culture results pending, negative so far, fluid studies not overt for empyema -  tube management per TCV / IR, no removal today, possibly tomorrow, d/w TCV on the floor  Diabetes mellitus type 1  - A1c 9.7% in 2015; serum glucose is 322 on admission.  - continue Lantus / SSI.  - elevated fasting CBGs again, increase Lantus again today to 24 and keep scheduled mealtime same  Essential Hypertension - home BP meds on hold since admission, resumed Coreg at lower dose, continue to hold Norvasc - BP 135/70 this morning, continue current regimen   Hyponatremia  - fluid status managed with HD  ESRD (end stage renal disease) on dialysis [facility])  - consulted nephrology  Normocytic anemia - in the setting of ESRD   DVT prophylaxis: heparin Code Status: Full Family Communication: no family @ bedside Disposition Plan: TBD, home when chest tubes come out  Consultants:   Cardiothoracic surgery  Interventional radiology  Nephrology  Procedures:   HD: yes   CT guided pigtail catheter placement bilaterally 10/19  Antimicrobials:  Cefepime 10/17 >>  Vancomycin 10/17 >>  Subjective: No complaints today, wants to go home  Objective: Vitals:   12/02/15 1500 12/02/15 1523 12/02/15 1949 12/03/15 0422  BP: 122/68 (!) 150/70 119/71 135/70  Pulse: 72 88 98 95  Resp:  18 20 20   Temp:  98.5 F (36.9 C) 98.4 F (36.9 C) 98.8 F (37.1 C)  TempSrc:  Oral Oral Oral  SpO2:  99% 99% 98%  Weight:  96.9 kg (213 lb 10 oz)  97.1 kg (214 lb)  Height:        Intake/Output Summary (Last 24 hours) at 12/03/15 1038 Last data filed at 12/03/15 0600  Gross per 24 hour  Intake  0 ml  Output             2021 ml  Net            -2021 ml   Filed Weights   12/02/15 1215 12/02/15 1523 12/03/15 0422  Weight: 99 kg (218 lb 4.1 oz) 96.9 kg (213 lb 10 oz) 97.1 kg (214 lb)    Examination: Constitutional: NAD Vitals:   12/02/15 1500 12/02/15 1523 12/02/15 1949 12/03/15 0422  BP: 122/68 (!) 150/70 119/71 135/70  Pulse: 72 88 98 95  Resp:  18 20 20   Temp:   98.5 F (36.9 C) 98.4 F (36.9 C) 98.8 F (37.1 C)  TempSrc:  Oral Oral Oral  SpO2:  99% 99% 98%  Weight:  96.9 kg (213 lb 10 oz)  97.1 kg (214 lb)  Height:       Eyes: lids and conjunctivae normal ENMT: Mucous membranes are moist.  Respiratory: clear to auscultation bilaterally, no wheezing, no crackles. Normal respiratory effort.  Cardiovascular: Regular rate and rhythm, no murmurs / rubs / gallops. No LE edema. Skin: no rashes, lesions, ulcers.   Data Reviewed: I have personally reviewed following labs and imaging studies  CBC:  Recent Labs Lab 11/28/15 1105 11/28/15 1618 11/29/15 1258 11/30/15 0237 12/01/15 0712 12/03/15 0246  WBC 13.7* 12.9* 11.7* 11.0* 9.9 9.1  NEUTROABS 9.3* 8.6*  --   --   --   --   HGB 10.7* 10.8* 10.2* 10.3* 10.1* 10.4*  HCT 32.2* 32.0* 30.7* 30.4* 29.6* 31.0*  MCV 96.7 96.7 93.3 93.3 92.8 92.5  PLT 589* 538* 556* 535* 518* 371*   Basic Metabolic Panel:  Recent Labs Lab 11/29/15 1258 11/30/15 0237 12/01/15 0713 12/02/15 0423 12/03/15 0246  NA 131* 131* 127* 130* 133*  K 5.0 4.7 4.6 4.5 4.2  CL 91* 91* 90* 90* 94*  CO2 26 27 21* 27 26  GLUCOSE 239* 381* 345* 355* 246*  BUN 44* 26* 40* 25* 19  CREATININE 12.16* 8.98* 12.10* 8.19* 7.23*  CALCIUM 8.5* 8.2* 8.6* 8.3* 8.8*  PHOS 6.3*  --  8.7*  --   --    GFR: Estimated Creatinine Clearance: 15.3 mL/min (by C-G formula based on SCr of 7.23 mg/dL (H)). Liver Function Tests:  Recent Labs Lab 11/28/15 1618 11/29/15 1258 12/01/15 0713  AST 15  --   --   ALT 14*  --   --   ALKPHOS 119  --   --   BILITOT 1.3*  --   --   PROT 7.9  --   --   ALBUMIN 2.5* 2.2* 2.2*   No results for input(s): LIPASE, AMYLASE in the last 168 hours. No results for input(s): AMMONIA in the last 168 hours. Coagulation Profile:  Recent Labs Lab 11/30/15 0237  INR 1.16   Cardiac Enzymes: No results for input(s): CKTOTAL, CKMB, CKMBINDEX, TROPONINI in the last 168 hours. BNP (last 3 results) No  results for input(s): PROBNP in the last 8760 hours. HbA1C: No results for input(s): HGBA1C in the last 72 hours. CBG:  Recent Labs Lab 12/01/15 2122 12/02/15 0631 12/02/15 1623 12/02/15 2034 12/03/15 0612  GLUCAP 325* 382* 187* 266* 299*   Lipid Profile: No results for input(s): CHOL, HDL, LDLCALC, TRIG, CHOLHDL, LDLDIRECT in the last 72 hours. Thyroid Function Tests: No results for input(s): TSH, T4TOTAL, FREET4, T3FREE, THYROIDAB in the last 72 hours. Anemia Panel: No results for input(s): VITAMINB12, FOLATE, FERRITIN, TIBC, IRON, RETICCTPCT in the last 72 hours.  Invalid input(s): PROCALCITONIN, LACTICIDVEN  Recent Results (from the past 240 hour(s))  Culture, blood (single)     Status: None   Collection Time: 11/28/15 11:05 AM  Result Value Ref Range Status   Specimen Description BLOOD LEFT HAND  Final   Special Requests   Final    BOTTLES DRAWN AEROBIC AND ANAEROBIC AEB=7CC ANA=6CC   Culture NO GROWTH 5 DAYS  Final   Report Status 12/03/2015 FINAL  Final  Blood culture (routine x 2)     Status: None   Collection Time: 11/28/15  4:18 PM  Result Value Ref Range Status   Specimen Description BLOOD RIGHT FOREARM  Final   Special Requests   Final    BOTTLES DRAWN AEROBIC AND ANAEROBIC AEB=8CC ANA=6CC   Culture NO GROWTH 5 DAYS  Final   Report Status 12/03/2015 FINAL  Final  Blood culture (routine x 2)     Status: None   Collection Time: 11/28/15  4:24 PM  Result Value Ref Range Status   Specimen Description BLOOD LEFT HAND  Final   Special Requests   Final    BOTTLES DRAWN AEROBIC AND ANAEROBIC AEB=6CC ANA=5CC   Culture NO GROWTH 5 DAYS  Final   Report Status 12/03/2015 FINAL  Final  Culture, body fluid-bottle     Status: None (Preliminary result)   Collection Time: 11/30/15  2:25 PM  Result Value Ref Range Status   Specimen Description FLUID RIGHT PLEURAL  Final   Special Requests BOTTLES DRAWN AEROBIC AND ANAEROBIC 10CC  Final   Culture NO GROWTH 2 DAYS  Final     Report Status PENDING  Incomplete  Gram stain     Status: None   Collection Time: 11/30/15  2:25 PM  Result Value Ref Range Status   Specimen Description FLUID RIGHT PLEURAL  Final   Special Requests NONE  Final   Gram Stain   Final    WBC PRESENT,BOTH PMN AND MONONUCLEAR NO ORGANISMS SEEN CYTOSPIN SMEAR    Report Status 11/30/2015 FINAL  Final  Culture, body fluid-bottle     Status: None (Preliminary result)   Collection Time: 11/30/15  2:26 PM  Result Value Ref Range Status   Specimen Description FLUID LEFT PLEURAL  Final   Special Requests BOTTLES DRAWN AEROBIC AND ANAEROBIC 10CC  Final   Culture NO GROWTH 1 DAY  Final   Report Status PENDING  Incomplete  Gram stain     Status: None   Collection Time: 11/30/15  2:26 PM  Result Value Ref Range Status   Specimen Description FLUID LEFT PLEURAL  Final   Special Requests NONE  Final   Gram Stain   Final    WBC PRESENT,BOTH PMN AND MONONUCLEAR NO ORGANISMS SEEN CYTOSPIN SMEAR    Report Status 11/30/2015 FINAL  Final      Radiology Studies: Dg Chest 2 View  Result Date: 12/02/2015 CLINICAL DATA:  Evaluate chest tube placement EXAM: CHEST  2 VIEW COMPARISON:  12/01/2015 FINDINGS: Bilateral chest tubes are again identified and stable. There is persistent fluid within the fissure on the right causing pseudotumor. Bibasilar atelectatic changes are noted. IMPRESSION: Bibasilar pleural drainage catheters. Persistent fluid is noted within the major fissure on the right. Bibasilar changes remain and are stable from the prior exam. No pneumothorax is noted. Electronically Signed   By: [name] M.D.   On: 12/02/2015 08:52   Dg Chest Port 1 View  Result Date: 12/03/2015 CLINICAL DATA:  Bilateral pleural effusion EXAM: PORTABLE CHEST  1 VIEW COMPARISON:  12/02/2015 FINDINGS: Cardiomediastinal silhouette is stable. Central mild vascular congestion without convincing pulmonary edema. Hazy bilateral basilar atelectasis or infiltrate.  Trace bilateral pleural effusion. Persistent fluid in right major fissure. IMPRESSION: Central mild vascular congestion without convincing pulmonary edema. Hazy bilateral basilar atelectasis or infiltrate. Trace bilateral pleural effusion. Persistent fluid in right major fissure. Electronically Signed   By: [name] M.D.   On: 12/03/2015 09:21   Dg Chest Port 1 View  Result Date: 12/01/2015 CLINICAL DATA:  Bilateral chest tube placement EXAM: PORTABLE CHEST 1 VIEW COMPARISON:  11/30/2015 FINDINGS: Cardiomegaly again noted. Bilateral basal pleural drainage catheter. Mild right basilar atelectasis or infiltrate. Trace left pleural effusion. No pulmonary edema. No pneumothorax. IMPRESSION: Bilateral basal pleural drainage catheter. Mild right basilar atelectasis or infiltrate. Trace left pleural effusion. No pulmonary edema. No pneumothorax. Electronically Signed   By: [name] M.D.   On: 12/01/2015 19:07   Scheduled Meds: . calcitRIOL  1.25 mcg Oral Q M,W,F-1800  . carvedilol  12.5 mg Oral BID WC  . ceFEPime (MAXIPIME) IV  1 g Intravenous Q24H  . guaiFENesin  600 mg Oral BID  . heparin  5,000 Units Subcutaneous Q8H  . insulin aspart  0-5 Units Subcutaneous QHS  . insulin aspart  0-9 Units Subcutaneous TID WC  . insulin aspart  5 Units Subcutaneous TID WC  . insulin glargine  20 Units Subcutaneous QHS  . sodium chloride flush  3 mL Intravenous Q12H  . vancomycin  1,000 mg Intravenous Q M,W,F-HD    Marzetta Board, MD, PhD [facility] Pager [phone]  If 7PM-7AM, please contact night-coverage [email or web address] Password [facility] 12/03/2015, 10:38 AM

## 2015-12-03 NOTE — Progress Notes (Signed)
[facility] Progress Note   Subjective: no c/o's.    Vitals:   12/02/15 1500 12/02/15 1523 12/02/15 1949 12/03/15 0422  BP: 122/68 (!) 150/70 119/71 135/70  Pulse: 72 88 98 95  Resp:  18 20 20   Temp:  98.5 F (36.9 C) 98.4 F (36.9 C) 98.8 F (37.1 C)  TempSrc:  Oral Oral Oral  SpO2:  99% 99% 98%  Weight:  96.9 kg (213 lb 10 oz)  97.1 kg (214 lb)  Height:        Inpatient medications: . calcitRIOL  1.25 mcg Oral Q M,W,F-1800  . carvedilol  12.5 mg Oral BID WC  . ceFEPime (MAXIPIME) IV  1 g Intravenous Q24H  . guaiFENesin  600 mg Oral BID  . heparin  5,000 Units Subcutaneous Q8H  . insulin aspart  0-5 Units Subcutaneous QHS  . insulin aspart  0-9 Units Subcutaneous TID WC  . insulin aspart  5 Units Subcutaneous TID WC  . insulin glargine  24 Units Subcutaneous QHS  . sodium chloride flush  3 mL Intravenous Q12H  . vancomycin  1,000 mg Intravenous Q M,W,F-HD     sodium chloride, acetaminophen, docusate sodium, fentaNYL, HYDROcodone-acetaminophen, midazolam, ondansetron, sodium chloride flush  Exam: General: WDWN, no distress Neck: no JVD Lungs: faint bibasilar rales Heart: RRR no MRG Abdomen: Soft, non-tender, non-distended with normoactive bowel sounds Lower extremities: 1+ LE edema  Neuro: Alert and oriented X 3. Moves all extremities spontaneously. Psych:  Responds to questions appropriately with a normal affect. HD Access: RUE AVF + thrill/bruit   Dialysis Orders:  MWF at [place]   2/2 bath  AVF RUA   Hep 5000 - Calcitriol 1.63mcg PO q HD - No venofer yet, but tsat 12% on 10/11 - No ESA currently.  Assessment: 1.  Dyspnea/ bilat pleural effusions: in setting recent admit Strep pneumo bacteremia/ PNA. Loculated effusions, sp bilat pigtail chest tubes. F/b chest surg, on abx vanc/ cefipime 2. ESRD - continue MWF HD, lowering dry wt as tolerated 3. Vol excess/ pulm edema / vasc congestion - CXR today vasc  congestion 4. Anemia: Hgb 10.8. No ESA for now 5.  Metabolic bone disease: Ca 7.8. Continue binders/VDRA. Will check Phos. 6.  Nutrition:  Alb 2.5; start Nepro supplements. Pt requesting change to reg diet, supposed to be on high protein diet per OP center 7. Type I DM: Per primary.  Plan - HD tomorrow, lower vol further, dc coreg, abx/ chest tubes   [name] MD [place] pager [phone]   12/03/2015, 11:14 AM    Recent Labs Lab 11/29/15 1258  12/01/15 0713 12/02/15 0423 12/03/15 0246  NA 131*  < > 127* 130* 133*  K 5.0  < > 4.6 4.5 4.2  CL 91*  < > 90* 90* 94*  CO2 26  < > 21* 27 26  GLUCOSE 239*  < > 345* 355* 246*  BUN 44*  < > 40* 25* 19  CREATININE 12.16*  < > 12.10* 8.19* 7.23*  CALCIUM 8.5*  < > 8.6* 8.3* 8.8*  PHOS 6.3*  --  8.7*  --   --   < > = values in this interval not displayed.  Recent Labs Lab 11/28/15 1618 11/29/15 1258 12/01/15 0713  AST 15  --   --   ALT 14*  --   --   ALKPHOS 119  --   --   BILITOT 1.3*  --   --  PROT 7.9  --   --   ALBUMIN 2.5* 2.2* 2.2*    Recent Labs Lab 11/28/15 1105 11/28/15 1618  11/30/15 0237 12/01/15 0712 12/03/15 0246  WBC 13.7* 12.9*  < > 11.0* 9.9 9.1  NEUTROABS 9.3* 8.6*  --   --   --   --   HGB 10.7* 10.8*  < > 10.3* 10.1* 10.4*  HCT 32.2* 32.0*  < > 30.4* 29.6* 31.0*  MCV 96.7 96.7  < > 93.3 92.8 92.5  PLT 589* 538*  < > 535* 518* 439*  < > = values in this interval not displayed. Iron/TIBC/Ferritin/ %Sat No results found for: IRON, TIBC, FERRITIN, IRONPCTSAT

## 2015-12-03 NOTE — Progress Notes (Signed)
Patient ID: Lance [name], male   DOB: Aug 18, 1970, 45 y.o.   MRN: [id number]    Referring Physician(s): Dr. Lanelle Bal  Supervising Physician: Daryll Brod  Patient Status: [facility] - In-pt  Chief Complaint: Bilateral pleural effusions  Subjective: Patient feels well.  No complaints.  Allergies: Atorvastatin and Minoxidil  Medications: Prior to Admission medications   Medication Sig Start Date End Date Taking? Authorizing Provider  amLODipine (NORVASC) 10 MG tablet TAKE 1 TABLET BY MOUTH EVERY DAY Patient taking differently: TAKE 1 TABLET BY MOUTH EVERY DAY IN THE EVENING 07/18/14  Yes [name], MD  carvedilol (COREG) 25 MG tablet TAKE 2 TABLETS BY MOUTH TWICE A DAY Patient taking differently: TAKE 1 TABLET BY MOUTH TWICE A DAY 01/24/14  Yes Satira Sark, MD  insulin aspart (NOVOLOG) 100 UNIT/ML injection Inject 10 Units into the skin 3 (three) times daily before meals. Patient taking differently: Inject 10-14 Units into the skin 3 (three) times daily before meals. Per sliding scale 12/29/13  Yes Delfina Redwood, MD  ondansetron (ZOFRAN ODT) 4 MG disintegrating tablet Take 1 tablet (4 mg total) by mouth every 6 (six) hours as needed for nausea or vomiting. 11/28/15  Yes [name], MD  diclofenac sodium (VOLTAREN) 1 % GEL Apply 4 g topically 4 (four) times daily. Patient not taking: Reported on 11/28/2015 11/16/15   Thurnell Lose, MD    Vital Signs: BP 135/70 (BP Location: Left Arm)   Pulse 95   Temp 98.8 F (37.1 C) (Oral)   Resp 20   Ht 6\' 3"  (1.905 m)   Wt 214 lb (97.1 kg)   SpO2 98%   BMI 26.75 kg/m   Physical Exam: Chest: CTAB. Right chest tube with 19cc output yesterday and left with 2cc.  Both are serous.  Both sites are c/d/i  Imaging: Dg Chest 2 View  Result Date: 12/02/2015 CLINICAL DATA:  Evaluate chest tube placement EXAM: CHEST  2 VIEW COMPARISON:  12/01/2015 FINDINGS: Bilateral chest tubes are again identified and stable.  There is persistent fluid within the fissure on the right causing pseudotumor. Bibasilar atelectatic changes are noted. IMPRESSION: Bibasilar pleural drainage catheters. Persistent fluid is noted within the major fissure on the right. Bibasilar changes remain and are stable from the prior exam. No pneumothorax is noted. Electronically Signed   By: [name] M.D.   On: 12/02/2015 08:52   Dg Chest Port 1 View  Result Date: 12/03/2015 CLINICAL DATA:  Bilateral pleural effusion EXAM: PORTABLE CHEST 1 VIEW COMPARISON:  12/02/2015 FINDINGS: Cardiomediastinal silhouette is stable. Central mild vascular congestion without convincing pulmonary edema. Hazy bilateral basilar atelectasis or infiltrate. Trace bilateral pleural effusion. Persistent fluid in right major fissure. IMPRESSION: Central mild vascular congestion without convincing pulmonary edema. Hazy bilateral basilar atelectasis or infiltrate. Trace bilateral pleural effusion. Persistent fluid in right major fissure. Electronically Signed   By: [name] M.D.   On: 12/03/2015 09:21   Dg Chest Port 1 View  Result Date: 12/01/2015 CLINICAL DATA:  Bilateral chest tube placement EXAM: PORTABLE CHEST 1 VIEW COMPARISON:  11/30/2015 FINDINGS: Cardiomegaly again noted. Bilateral basal pleural drainage catheter. Mild right basilar atelectasis or infiltrate. Trace left pleural effusion. No pulmonary edema. No pneumothorax. IMPRESSION: Bilateral basal pleural drainage catheter. Mild right basilar atelectasis or infiltrate. Trace left pleural effusion. No pulmonary edema. No pneumothorax. Electronically Signed   By: [name] M.D.   On: 12/01/2015 19:07   Ct Perc Pleural Drain W/indwell Cath W/img  Guide  Result Date: 11/30/2015 INDICATION: History of hospital acquired pneumonia, now with bilateral pleural effusions. Patient has been evaluated by the cardiothoracic surgery service and deemed a poor candidate for VATS procedure. As such, request made for  placement of bilateral percutaneous drainage catheter placements. EXAM: 1. CT-GUIDED LEFT-SIDED CHEST TUBE DRAINAGE CATHETER PLACEMENT 2. CT-GUIDED RIGHT-SIDED CHEST TUBE DRAINAGE CATHETER PLACEMENT COMPARISON:  Chest CT - 11/28/2015 MEDICATIONS: The patient is currently admitted to the hospital and receiving intravenous antibiotics. The antibiotics were administered within an appropriate time frame prior to the initiation of the procedure. ANESTHESIA/SEDATION: Moderate (conscious) sedation was employed during this procedure. A total of Versed 1 mg and Fentanyl 50 mcg was administered intravenously. Moderate Sedation Time: 15 minutes. The patient's level of consciousness and vital signs were monitored continuously by radiology nursing throughout the procedure under my direct supervision. CONTRAST:  None COMPLICATIONS: None immediate. PROCEDURE: Informed written consent was obtained from the patient after a discussion of the risks, benefits and alternatives to treatment. The patient was placed supine on the CT gantry and a pre procedural CT was performed re-demonstrating the known partially loculated bilateral pleural effusions. The procedure was planned. A timeout was performed prior to the initiation of the procedure. The skin overlying the inferior lateral aspects of the bilateral lower thoraces were prepped and draped in the usual sterile fashion. Beginning with the left-sided pleural effusion, the overlying soft tissues were anesthetized with 1% lidocaine with epinephrine. Appropriate trajectory was planned with the use of a 22 gauge spinal needle. An 18 gauge trocar needle was advanced into the abscess/fluid collection and a short Amplatz super stiff wire was coiled within the collection. Appropriate positioning was confirmed with a limited CT scan. The tract was serially dilated allowing placement of a 12 [place] all-purpose drainage catheter. Appropriate positioning was confirmed with a limited postprocedural  CT scan. Approximately 150 cc of serous, amber colored fluid was aspirated following placement of the left-sided chest tube. Attention was now paid towards placement of the right-sided chest tube. The overlying soft tissues were anesthetized with 1% lidocaine with epinephrine. Appropriate trajectory was planned with the use of a 22 gauge spinal needle. An 18 gauge trocar needle was advanced into the abscess/fluid collection and a short Amplatz super stiff wire was coiled within the collection. Appropriate positioning was confirmed with a limited CT scan. The tract was serially dilated allowing placement of a 12 [place] all-purpose drainage catheter. Appropriate positioning was confirmed with a limited postprocedural CT scan. Approximately 700 cc of serous, amber colored fluid was aspirated following placement of the right-sided chest tube. The bilateral chest tubes connected to pleural vac devices and sutured in place. A dressing was placed. The patient tolerated the procedure well without immediate post procedural complication. IMPRESSION: 1. Successful CT guided placement of a 12 [place] all purpose drain catheter into the left pleural space with aspiration of 150 mL of serous, amber color fluid. 2. Successful CT-guided placement of a 36 [place] all-purpose drainage catheter into the right pleural space with aspiration of approximately 700 cc of serous, amber colored fluid. 3. Separate samples from each chest tube were sent to the laboratory as requested by the ordering clinical team. Electronically Signed   By: Sandi Mariscal M.D.   On: 11/30/2015 14:49   Ct Perc Pleural Drain W/indwell Cath W/img Guide  Result Date: 11/30/2015 INDICATION: History of hospital acquired pneumonia, now with bilateral pleural effusions. Patient has been evaluated by the cardiothoracic surgery service and deemed a poor candidate  for VATS procedure. As such, request made for placement of bilateral percutaneous drainage catheter  placements. EXAM: 1. CT-GUIDED LEFT-SIDED CHEST TUBE DRAINAGE CATHETER PLACEMENT 2. CT-GUIDED RIGHT-SIDED CHEST TUBE DRAINAGE CATHETER PLACEMENT COMPARISON:  Chest CT - 11/28/2015 MEDICATIONS: The patient is currently admitted to the hospital and receiving intravenous antibiotics. The antibiotics were administered within an appropriate time frame prior to the initiation of the procedure. ANESTHESIA/SEDATION: Moderate (conscious) sedation was employed during this procedure. A total of Versed 1 mg and Fentanyl 50 mcg was administered intravenously. Moderate Sedation Time: 15 minutes. The patient's level of consciousness and vital signs were monitored continuously by radiology nursing throughout the procedure under my direct supervision. CONTRAST:  None COMPLICATIONS: None immediate. PROCEDURE: Informed written consent was obtained from the patient after a discussion of the risks, benefits and alternatives to treatment. The patient was placed supine on the CT gantry and a pre procedural CT was performed re-demonstrating the known partially loculated bilateral pleural effusions. The procedure was planned. A timeout was performed prior to the initiation of the procedure. The skin overlying the inferior lateral aspects of the bilateral lower thoraces were prepped and draped in the usual sterile fashion. Beginning with the left-sided pleural effusion, the overlying soft tissues were anesthetized with 1% lidocaine with epinephrine. Appropriate trajectory was planned with the use of a 22 gauge spinal needle. An 18 gauge trocar needle was advanced into the abscess/fluid collection and a short Amplatz super stiff wire was coiled within the collection. Appropriate positioning was confirmed with a limited CT scan. The tract was serially dilated allowing placement of a 12 [place] all-purpose drainage catheter. Appropriate positioning was confirmed with a limited postprocedural CT scan. Approximately 150 cc of serous, amber colored  fluid was aspirated following placement of the left-sided chest tube. Attention was now paid towards placement of the right-sided chest tube. The overlying soft tissues were anesthetized with 1% lidocaine with epinephrine. Appropriate trajectory was planned with the use of a 22 gauge spinal needle. An 18 gauge trocar needle was advanced into the abscess/fluid collection and a short Amplatz super stiff wire was coiled within the collection. Appropriate positioning was confirmed with a limited CT scan. The tract was serially dilated allowing placement of a 12 [place] all-purpose drainage catheter. Appropriate positioning was confirmed with a limited postprocedural CT scan. Approximately 700 cc of serous, amber colored fluid was aspirated following placement of the right-sided chest tube. The bilateral chest tubes connected to pleural vac devices and sutured in place. A dressing was placed. The patient tolerated the procedure well without immediate post procedural complication. IMPRESSION: 1. Successful CT guided placement of a 12 [place] all purpose drain catheter into the left pleural space with aspiration of 150 mL of serous, amber color fluid. 2. Successful CT-guided placement of a 15 French all-purpose drainage catheter into the right pleural space with aspiration of approximately 700 cc of serous, amber colored fluid. 3. Separate samples from each chest tube were sent to the laboratory as requested by the ordering clinical team. Electronically Signed   By: Sandi Mariscal M.D.   On: 11/30/2015 14:49    Labs:  CBC:  Recent Labs  11/29/15 1258 11/30/15 0237 12/01/15 0712 12/03/15 0246  WBC 11.7* 11.0* 9.9 9.1  HGB 10.2* 10.3* 10.1* 10.4*  HCT 30.7* 30.4* 29.6* 31.0*  PLT 556* 535* 518* 439*    COAGS:  Recent Labs  11/30/15 0237  INR 1.16  APTT 35    BMP:  Recent Labs  11/30/15 0237 12/01/15  2902 12/02/15 0423 12/03/15 0246  NA 131* 127* 130* 133*  K 4.7 4.6 4.5 4.2  CL 91* 90* 90*  94*  CO2 27 21* 27 26  GLUCOSE 381* 345* 355* 246*  BUN 26* 40* 25* 19  CALCIUM 8.2* 8.6* 8.3* 8.8*  CREATININE 8.98* 12.10* 8.19* 7.23*  GFRNONAA 6* 4* 7* 8*  GFRAA 7* 5* 8* 9*    LIVER FUNCTION TESTS:  Recent Labs  11/12/15 0305  11/15/15 0745 11/28/15 1618 11/29/15 1258 12/01/15 0713  BILITOT 0.9  --   --  1.3*  --   --   AST 20  --   --  15  --   --   ALT 16*  --   --  14*  --   --   ALKPHOS 64  --   --  119  --   --   PROT 8.0  --   --  7.9  --   --   ALBUMIN 3.3*  < > 2.4* 2.5* 2.2* 2.2*  < > = values in this interval not displayed.  Assessment and Plan: 1. S/p bilateral chest tube placements secondary to pleural effusions, 10/19 -drains in place and doing well.  Minimal output in both drains. -TCTS recommended continued drains for now. -will follow  Electronically Signed: Brookie Wayment E 12/03/2015, 11:24 AM   I spent a total of 15 Minutes at the the patient's bedside AND on the patient's hospital floor or unit, greater than 50% of which was counseling/coordinating care for bilateral pleural effusions

## 2015-12-04 ENCOUNTER — Inpatient Hospital Stay (HOSPITAL_COMMUNITY): Payer: Medicare Other

## 2015-12-04 DIAGNOSIS — J90 Pleural effusion, not elsewhere classified: Principal | ICD-10-CM

## 2015-12-04 LAB — PATHOLOGIST SMEAR REVIEW

## 2015-12-04 LAB — IRON AND TIBC
Iron: 22 ug/dL — ABNORMAL LOW (ref 45–182)
Saturation Ratios: 16 % — ABNORMAL LOW (ref 17.9–39.5)
TIBC: 139 ug/dL — ABNORMAL LOW (ref 250–450)
UIBC: 117 ug/dL

## 2015-12-04 LAB — PHOSPHORUS: PHOSPHORUS: 6 mg/dL — AB (ref 2.5–4.6)

## 2015-12-04 LAB — CBC
HCT: 29.6 % — ABNORMAL LOW (ref 39.0–52.0)
HEMOGLOBIN: 10.1 g/dL — AB (ref 13.0–17.0)
MCH: 31.5 pg (ref 26.0–34.0)
MCHC: 34.1 g/dL (ref 30.0–36.0)
MCV: 92.2 fL (ref 78.0–100.0)
Platelets: 460 10*3/uL — ABNORMAL HIGH (ref 150–400)
RBC: 3.21 MIL/uL — ABNORMAL LOW (ref 4.22–5.81)
RDW: 14.7 % (ref 11.5–15.5)
WBC: 8.4 10*3/uL (ref 4.0–10.5)

## 2015-12-04 LAB — COMPREHENSIVE METABOLIC PANEL
ALT: 12 U/L — ABNORMAL LOW (ref 17–63)
ANION GAP: 13 (ref 5–15)
AST: 16 U/L (ref 15–41)
Albumin: 2.1 g/dL — ABNORMAL LOW (ref 3.5–5.0)
Alkaline Phosphatase: 85 U/L (ref 38–126)
BILIRUBIN TOTAL: 0.9 mg/dL (ref 0.3–1.2)
BUN: 33 mg/dL — AB (ref 6–20)
CO2: 24 mmol/L (ref 22–32)
Calcium: 8.8 mg/dL — ABNORMAL LOW (ref 8.9–10.3)
Chloride: 94 mmol/L — ABNORMAL LOW (ref 101–111)
Creatinine, Ser: 11.01 mg/dL — ABNORMAL HIGH (ref 0.61–1.24)
GFR calc Af Amer: 6 mL/min — ABNORMAL LOW (ref >60)
GFR, EST NON AFRICAN AMERICAN: 5 mL/min — AB (ref >60)
Glucose, Bld: 177 mg/dL — ABNORMAL HIGH (ref 65–99)
POTASSIUM: 4.8 mmol/L (ref 3.5–5.1)
Sodium: 131 mmol/L — ABNORMAL LOW (ref 135–145)
TOTAL PROTEIN: 7.6 g/dL (ref 6.5–8.1)

## 2015-12-04 LAB — GLUCOSE, CAPILLARY
GLUCOSE-CAPILLARY: 400 mg/dL — AB (ref 65–99)
GLUCOSE-CAPILLARY: 170 mg/dL — AB (ref 65–99)
GLUCOSE-CAPILLARY: 306 mg/dL — AB (ref 65–99)
GLUCOSE-CAPILLARY: 490 mg/dL — AB (ref 65–99)

## 2015-12-04 MED ORDER — VANCOMYCIN HCL IN DEXTROSE 1-5 GM/200ML-% IV SOLN: 1000.0000 mg | INTRAVENOUS | Status: DC

## 2015-12-04 MED ORDER — HYDROCODONE-ACETAMINOPHEN 5-325 MG PO TABS: 1.0000 | ORAL_TABLET | Freq: Four times a day (QID) | ORAL | 0 refills | Status: DC | PRN

## 2015-12-04 MED ORDER — PENTAFLUOROPROP-TETRAFLUOROETH EX AERO: 1.0000 "application " | INHALATION_SPRAY | CUTANEOUS | Status: DC | PRN

## 2015-12-04 MED ORDER — CALCITRIOL 0.25 MCG PO CAPS
ORAL_CAPSULE | ORAL | Status: AC
  Filled 2015-12-04: qty 1

## 2015-12-04 MED ORDER — INSULIN ASPART 100 UNIT/ML ~~LOC~~ SOLN
15.0000 [IU] | Freq: Once | SUBCUTANEOUS | Status: AC
  Administered 2015-12-04: 15 [IU] via SUBCUTANEOUS

## 2015-12-04 MED ORDER — ALTEPLASE 2 MG IJ SOLR: 2.0000 mg | Freq: Once | INTRAMUSCULAR | Status: DC | PRN

## 2015-12-04 MED ORDER — CALCITRIOL 0.5 MCG PO CAPS
ORAL_CAPSULE | ORAL | Status: AC
  Filled 2015-12-04: qty 2

## 2015-12-04 MED ORDER — HEPARIN SODIUM (PORCINE) 1000 UNIT/ML DIALYSIS
5000.0000 [IU] | Freq: Once | INTRAMUSCULAR | Status: DC
  Filled 2015-12-04: qty 5

## 2015-12-04 MED ORDER — LIDOCAINE-PRILOCAINE 2.5-2.5 % EX CREA: 1.0000 "application " | TOPICAL_CREAM | CUTANEOUS | Status: DC | PRN

## 2015-12-04 MED ORDER — VANCOMYCIN HCL IN DEXTROSE 1-5 GM/200ML-% IV SOLN
INTRAVENOUS | Status: AC
  Filled 2015-12-04: qty 200

## 2015-12-04 MED ORDER — LIDOCAINE HCL (PF) 1 % IJ SOLN: 5.0000 mL | INTRAMUSCULAR | Status: DC | PRN

## 2015-12-04 MED ORDER — SODIUM CHLORIDE 0.9 % IV SOLN: 100.0000 mL | INTRAVENOUS | Status: DC | PRN

## 2015-12-04 MED ORDER — DARBEPOETIN ALFA 100 MCG/0.5ML IJ SOSY
PREFILLED_SYRINGE | INTRAMUSCULAR | Status: AC
  Filled 2015-12-04: qty 0.5

## 2015-12-04 MED ORDER — HEPARIN SODIUM (PORCINE) 1000 UNIT/ML DIALYSIS: 1000.0000 [IU] | INTRAMUSCULAR | Status: DC | PRN

## 2015-12-04 MED ORDER — DARBEPOETIN ALFA 100 MCG/0.5ML IJ SOSY
100.0000 ug | PREFILLED_SYRINGE | INTRAMUSCULAR | Status: DC
  Administered 2015-12-04: 100 ug via INTRAVENOUS
  Filled 2015-12-04: qty 0.5

## 2015-12-04 NOTE — Progress Notes (Signed)
Pt okay, CXR stable Minimal output D/W CVTS Chest tubes removed at bedside without issue. CXR ordered. Probable DC home tonight  Ascencion Dike PA-C Interventional Radiology 12/04/2015 5:33 PM

## 2015-12-04 NOTE — Progress Notes (Addendum)
NP received report from attending, Dr. Cruzita Lederer, at change of shift about pt wanting to go home tonight. Apparently, he was told by CT that he could leave if his CXR was OK after removal of chest tubes. Chest tubes were removed earlier today, but CXR still hasn't been read by radiologist. Dr. Cruzita Lederer stated he had already spoken to the pt today, saying he (Dr.) did not like to send people home at night. Pt was adamant at that time that he wanted to leave even if it was late tonight. Per Dr. Cruzita Lederer, pt was aware that he wouldn't be officially discharged until CXR was cleared.  While waiting the CXR read, pt's blood sugar was 490. NP ordered extra Novolog insulin. Sugar recheck in an hour, and a stat BMP to made sure he is not acidotic given sugar level. Pt refused BMP. After this, RN called back stating pt had removed his tele leads and stated he was leaving. NP again iterated that the CXR hasn't been read (to ensure he doesn't have any complications from chest tubes/removal) and that NP does not feel comfortable letting pt leave with a sugar that high.  NP informed RN that pt will have to leave AMA if he is not willing to wait on these results. [facility], NP [facility]  Now, at 2320, RN informs NP that pt does not have a ride home and will stay the night.  [facility], NP [facility]

## 2015-12-04 NOTE — Progress Notes (Signed)
PROGRESS NOTE  Lance [name] EGB:[id number] DOB: Mar 31, 1970 DOA: 11/28/2015 PCP: Mickie Hillier, MD   LOS: 6 days   Brief Narrative: 45 y.o. Male with a history of ESRD on hemodialysis, type 1 diabetes, and HTN who presented to the ED under instruction of PCP for low-grade fever, nonproductive cough, dyspnea, and abnormal chest XR findings. Patient was admitted to [facility] in the first week of October for pneumonia. Growth showed strep pneumo from blood cultures. He was discharged on vanomycin with dialysis for 4 weeks, although the patient reports only receiving 4 treatments. Chest x-ray on 11/15/15 showed loculated right pleural effusion concerning for empyema. He felt fine after discharge for about 1 week. He then developed nausea, vomiting, dyspnea, and a temp of 99. He also developed right lateral mid-back pain. Patient underwent CT guided pigtail catheter placement bilaterally 11/30/15. Gram stain showed WBC present, both PMN and mononuclear no organisms seen cytospin smear. Patient complains of headache and mild nausea. Patient denies chest pain, palpitations, edema, pleuritic chest pain, and vomiting.   Assessment & Plan: Principal Problem:   Loculated pleural effusion Active Problems:   Diabetes mellitus type 1 (HCC)   Essential hypertension, benign   Chronic diastolic heart failure (HCC)   Hyponatremia   ESRD (end stage renal disease) on dialysis (HCC)   Bilateral pleural effusion   HCAP (healthcare-associated pneumonia)    HCAP (healthcare-associated pneumonia) / Loculated pleural effusion - Chest x-ray on 11/15/15 showed loculated right pleural effusion concerning for empyema.  - Patient underwent CT guided bilateral pigtail catheter placement 10/19 - Continue vancomycin and cefepime  - Gram stain showed WBC present, both PMN and mononuclear no organisms seen cytospin smear - Aspirate fluid culture results pending, negative so far, fluid studies not overt for empyema -  tube management per TCV / IR, remove today, repeat CXR 9 pm, if stable can go home tonight.  Diabetes mellitus type 1  - A1c 9.7% in 2015  Essential Hypertension - resume home BP meds on discharge  Hyponatremia  - fluid status managed with HD  ESRD (end stage renal disease) on dialysis [facility])  - consulted nephrology  Normocytic anemia - in the setting of ESRD   DVT prophylaxis: heparin Code Status: Full Family Communication: no family @ bedside Disposition Plan: TBD, home later tonight if follow up CXR stable  Consultants:   Cardiothoracic surgery  Interventional radiology  Nephrology  Procedures:   HD: yes   CT guided pigtail catheter placement bilaterally 10/19  Antimicrobials:  Cefepime 10/17 >>  Vancomycin 10/17 >>  Subjective: No complaints today, wants to go home  Objective: Vitals:   12/04/15 1212 12/04/15 1300 12/04/15 1330 12/04/15 1359  BP: (!) 157/105 (!) 160/88 (!) 158/87 (!) 161/89  Pulse: 88 93 94 96  Resp: 17     Temp:      TempSrc:      SpO2:      Weight:      Height:        Intake/Output Summary (Last 24 hours) at 12/04/15 1437 Last data filed at 12/04/15 1300  Gross per 24 hour  Intake              240 ml  Output                0 ml  Net              240 ml   Filed Weights   12/03/15 0422 12/04/15 0409 12/04/15 1208  Weight:  97.1 kg (214 lb) 98.8 kg (217 lb 12.8 oz) 99.4 kg (219 lb 2.2 oz)    Examination: Constitutional: NAD Vitals:   12/04/15 1212 12/04/15 1300 12/04/15 1330 12/04/15 1359  BP: (!) 157/105 (!) 160/88 (!) 158/87 (!) 161/89  Pulse: 88 93 94 96  Resp: 17     Temp:      TempSrc:      SpO2:      Weight:      Height:       Eyes: lids and conjunctivae normal ENMT: Mucous membranes are moist.  Respiratory: clear to auscultation bilaterally, no wheezing, no crackles. Normal respiratory effort.  Cardiovascular: Regular rate and rhythm, no murmurs / rubs / gallops. No LE edema. Skin: no rashes, lesions,  ulcers.   Data Reviewed: I have personally reviewed following labs and imaging studies  CBC:  Recent Labs Lab 11/28/15 1105 11/28/15 1618 11/29/15 1258 11/30/15 0237 12/01/15 0712 12/03/15 0246 12/04/15 1230  WBC 13.7* 12.9* 11.7* 11.0* 9.9 9.1 8.4  NEUTROABS 9.3* 8.6*  --   --   --   --   --   HGB 10.7* 10.8* 10.2* 10.3* 10.1* 10.4* 10.1*  HCT 32.2* 32.0* 30.7* 30.4* 29.6* 31.0* 29.6*  MCV 96.7 96.7 93.3 93.3 92.8 92.5 92.2  PLT 589* 538* 556* 535* 518* 439* 035*   Basic Metabolic Panel:  Recent Labs Lab 11/29/15 1258 11/30/15 0237 12/01/15 0713 12/02/15 0423 12/03/15 0246 12/04/15 1230  NA 131* 131* 127* 130* 133* 131*  K 5.0 4.7 4.6 4.5 4.2 4.8  CL 91* 91* 90* 90* 94* 94*  CO2 26 27 21* 27 26 24   GLUCOSE 239* 381* 345* 355* 246* 177*  BUN 44* 26* 40* 25* 19 33*  CREATININE 12.16* 8.98* 12.10* 8.19* 7.23* 11.01*  CALCIUM 8.5* 8.2* 8.6* 8.3* 8.8* 8.8*  PHOS 6.3*  --  8.7*  --   --  6.0*   GFR: Estimated Creatinine Clearance: 10 mL/min (by C-G formula based on SCr of 11.01 mg/dL (H)). Liver Function Tests:  Recent Labs Lab 11/28/15 1618 11/29/15 1258 12/01/15 0713 12/04/15 1230  AST 15  --   --  16  ALT 14*  --   --  12*  ALKPHOS 119  --   --  85  BILITOT 1.3*  --   --  0.9  PROT 7.9  --   --  7.6  ALBUMIN 2.5* 2.2* 2.2* 2.1*   No results for input(s): LIPASE, AMYLASE in the last 168 hours. No results for input(s): AMMONIA in the last 168 hours. Coagulation Profile:  Recent Labs Lab 11/30/15 0237  INR 1.16   Cardiac Enzymes: No results for input(s): CKTOTAL, CKMB, CKMBINDEX, TROPONINI in the last 168 hours. BNP (last 3 results) No results for input(s): PROBNP in the last 8760 hours. HbA1C: No results for input(s): HGBA1C in the last 72 hours. CBG:  Recent Labs Lab 12/03/15 1104 12/03/15 1617 12/03/15 2034 12/04/15 0643 12/04/15 1116  GLUCAP 82 132* 220* 400* 170*   Lipid Profile: No results for input(s): CHOL, HDL, LDLCALC, TRIG,  CHOLHDL, LDLDIRECT in the last 72 hours. Thyroid Function Tests: No results for input(s): TSH, T4TOTAL, FREET4, T3FREE, THYROIDAB in the last 72 hours. Anemia Panel:  Recent Labs  12/04/15 1230  TIBC 139*  IRON 22*    Invalid input(s): PROCALCITONIN, LACTICIDVEN  Recent Results (from the past 240 hour(s))  Culture, blood (single)     Status: None   Collection Time: 11/28/15 11:05 AM  Result Value  Ref Range Status   Specimen Description BLOOD LEFT HAND  Final   Special Requests   Final    BOTTLES DRAWN AEROBIC AND ANAEROBIC AEB=7CC ANA=6CC   Culture NO GROWTH 5 DAYS  Final   Report Status 12/03/2015 FINAL  Final  Blood culture (routine x 2)     Status: None   Collection Time: 11/28/15  4:18 PM  Result Value Ref Range Status   Specimen Description BLOOD RIGHT FOREARM  Final   Special Requests   Final    BOTTLES DRAWN AEROBIC AND ANAEROBIC AEB=8CC ANA=6CC   Culture NO GROWTH 5 DAYS  Final   Report Status 12/03/2015 FINAL  Final  Blood culture (routine x 2)     Status: None   Collection Time: 11/28/15  4:24 PM  Result Value Ref Range Status   Specimen Description BLOOD LEFT HAND  Final   Special Requests   Final    BOTTLES DRAWN AEROBIC AND ANAEROBIC AEB=6CC ANA=5CC   Culture NO GROWTH 5 DAYS  Final   Report Status 12/03/2015 FINAL  Final  Culture, body fluid-bottle     Status: None (Preliminary result)   Collection Time: 11/30/15  2:25 PM  Result Value Ref Range Status   Specimen Description FLUID RIGHT PLEURAL  Final   Special Requests BOTTLES DRAWN AEROBIC AND ANAEROBIC 10CC  Final   Culture NO GROWTH 3 DAYS  Final   Report Status PENDING  Incomplete  Gram stain     Status: None   Collection Time: 11/30/15  2:25 PM  Result Value Ref Range Status   Specimen Description FLUID RIGHT PLEURAL  Final   Special Requests NONE  Final   Gram Stain   Final    WBC PRESENT,BOTH PMN AND MONONUCLEAR NO ORGANISMS SEEN CYTOSPIN SMEAR    Report Status 11/30/2015 FINAL  Final    Culture, body fluid-bottle     Status: None (Preliminary result)   Collection Time: 11/30/15  2:26 PM  Result Value Ref Range Status   Specimen Description FLUID LEFT PLEURAL  Final   Special Requests BOTTLES DRAWN AEROBIC AND ANAEROBIC 10CC  Final   Culture NO GROWTH 2 DAYS  Final   Report Status PENDING  Incomplete  Gram stain     Status: None   Collection Time: 11/30/15  2:26 PM  Result Value Ref Range Status   Specimen Description FLUID LEFT PLEURAL  Final   Special Requests NONE  Final   Gram Stain   Final    WBC PRESENT,BOTH PMN AND MONONUCLEAR NO ORGANISMS SEEN CYTOSPIN SMEAR    Report Status 11/30/2015 FINAL  Final      Radiology Studies: Dg Chest Port 1 View  Result Date: 12/03/2015 CLINICAL DATA:  Bilateral pleural effusion EXAM: PORTABLE CHEST 1 VIEW COMPARISON:  12/02/2015 FINDINGS: Cardiomediastinal silhouette is stable. Central mild vascular congestion without convincing pulmonary edema. Hazy bilateral basilar atelectasis or infiltrate. Trace bilateral pleural effusion. Persistent fluid in right major fissure. IMPRESSION: Central mild vascular congestion without convincing pulmonary edema. Hazy bilateral basilar atelectasis or infiltrate. Trace bilateral pleural effusion. Persistent fluid in right major fissure. Electronically Signed   By: [name] M.D.   On: 12/03/2015 09:21   Scheduled Meds: . calcitRIOL      . calcitRIOL      . Darbepoetin Alfa      . vancomycin      . calcitRIOL  1.25 mcg Oral Q M,W,F-1800  . ceFEPime (MAXIPIME) IV  1 g Intravenous Q24H  . cinacalcet  60 mg Oral Q supper  . darbepoetin (ARANESP) injection - DIALYSIS  100 mcg Intravenous Q Mon-HD  . feeding supplement  1 Container Oral TID BM  . guaiFENesin  600 mg Oral BID  . heparin  5,000 Units Subcutaneous Q8H  . [START ON 12/05/2015] heparin  5,000 Units Dialysis Once in dialysis  . insulin aspart  0-5 Units Subcutaneous QHS  . insulin aspart  0-9 Units Subcutaneous TID WC  .  insulin aspart  5 Units Subcutaneous TID WC  . insulin glargine  24 Units Subcutaneous QHS  . multivitamin  1 tablet Oral QHS  . sodium chloride flush  3 mL Intravenous Q12H  . sucroferric oxyhydroxide  1,000 mg Oral TID WC  . vancomycin  1,000 mg Intravenous Q M,W,F-HD    Marzetta Board, MD, PhD [facility] Pager [phone]  If 7PM-7AM, please contact night-coverage [email or web address] Password [facility] 12/04/2015, 2:37 PM

## 2015-12-04 NOTE — Procedures (Signed)
I was present at this session.  I have reviewed the session itself and made appropriate changes.  HD via L arm, AVF. bp in 150s sys to start.  Lower vol.  Access press ok.  Sana Tessmer L 10/23/20171:35 PM

## 2015-12-04 NOTE — Care Management Note (Signed)
Case Management Note Marvetta Gibbons RN, BSN Unit 2W-Case Manager [phone]  Patient Details  Name: Lance [name] MRN: [id number] Date of Birth: 1970/06/16  Subjective/Objective:   Pt admitted with bil. Pleural effusions, Hx- HD- M/W/F- [facility]. location                 Action/Plan: PTA pt lived at home- independent- anticipate return home- CM to follow for any d/c needs  Expected Discharge Date:                  Expected Discharge Plan:  Home/Self Care  In-House Referral:  NA  Discharge planning Services  CM Consult  Post Acute Care Choice:  NA Choice offered to:  NA  DME Arranged:    DME Agency:     HH Arranged:    HH Agency:     Status of Service:  Completed, signed off  If discussed at [employer] of Stay Meetings, dates discussed:    Additional Comments:  Dawayne Patricia, RN 12/04/2015, 11:35 AM

## 2015-12-04 NOTE — Discharge Summary (Signed)
Physician Discharge Summary  Lance [name] NKN:[id number] DOB: 10-Mar-1970 DOA: 11/28/2015  PCP: Mickie Hillier, MD  Admit date: 11/28/2015 Discharge date: 12/05/2015  Admitted From: home  Disposition: home  Recommendations for Outpatient Follow-up:  1. Follow up with PCP in 1-2 weeks 2. Continue Vancomycin with HD for 4 weeks, end date 12/29/2015  Patient left the hospital prior to receiving AVS from the nurse.  Home Health: none Equipment/Devices: none  Discharge Condition: stable CODE STATUS: Full Diet recommendation: renal/diabetic  HPI: 45 y.o. Male with a history of ESRD on hemodialysis, type 1 diabetes, and HTN who presented to the ED under instruction of PCP for low-grade fever, nonproductive cough, dyspnea, and abnormal chest XR findings. Patient was admitted to [facility] in the first week of October for pneumonia. Growth showed strep pneumo from blood cultures. He was discharged on vanomycin with dialysis for 4 weeks, although the patient reports only receiving 4 treatments. Chest x-ray on 11/15/15 showed loculated right pleural effusion concerning for empyema. He felt fine after discharge for about 1 week. He then developed nausea, vomiting, dyspnea, and a temp of 99. He also developed right lateral mid-back pain. Patient underwent CT guided pigtail catheter placement bilaterally 11/30/15. Gram stain showed WBC present, both PMN and mononuclear no organisms seen cytospin smear. CXR 12/03/15 shows central mild vascular congestion without convincing pulmonary edema, hazy bilateral basilar atelectasis or infiltrate, trace bilateral pleural effusion, and persistent fluid in right major fissure.   Hospital Course: Discharge Diagnoses:  Principal Problem:   Loculated pleural effusion Active Problems:   Diabetes mellitus type 1 (HCC)   Essential hypertension, benign   Chronic diastolic heart failure (HCC)   Hyponatremia   ESRD (end stage renal disease) on dialysis (HCC)   Bilateral pleural effusion   HCAP (healthcare-associated pneumonia)   HCAP (healthcare-associated pneumonia) / Loculated pleural effusion Patient had a recent hospitalization with HCAP, and with blood cultures positive for Strep pneumoniae. He was placed on Vancomycin with HD on discharge per ID recommendations. He was initially feeling well however started to have low grade fevers again and presented again to the ER. CT on 11/28/15 showed loculated bilateral pleural effusions. TCV and IR were consulted and patient underwent placement of bilateral pigtail catheters on 10/19. Gram stain showed WBC present, both PMN and mononuclear no organisms. Once catheters stopped draining were subsequently removed, follow up CXR was stable, TCV cleared patient for discharge, and patient was discharged home in stable condition. He is to continue Vancomycin with HD for 4 weeks following drainage, with end date 12/29/2015 Diabetes mellitus type 1 - poorly controlled, will need close outpatient follow up for further medication adjustments Essential Hypertension - resume home medications Hyponatremia - His fluid status was managed with HD, Na improved ESRD (end stage renal disease) on dialysis ([place])  - HD M/W/F. Ensure Vancomycin as above. Normocytic anemia - stable   Discharge Instructions     Medication List    TAKE these medications   amLODipine 10 MG tablet Commonly known as:  NORVASC TAKE 1 TABLET BY MOUTH EVERY DAY What changed:  See the new instructions.   carvedilol 25 MG tablet Commonly known as:  COREG TAKE 2 TABLETS BY MOUTH TWICE A DAY What changed:  See the new instructions.   diclofenac sodium 1 % Gel Commonly known as:  VOLTAREN Apply 4 g topically 4 (four) times daily.   HYDROcodone-acetaminophen 5-325 MG tablet Commonly known as:  NORCO/VICODIN Take 1-2 tablets by mouth every 6 (six) hours  as needed for moderate pain or severe pain.   insulin aspart 100 UNIT/ML  injection Commonly known as:  novoLOG Inject 10 Units into the skin 3 (three) times daily before meals. What changed:  how much to take  additional instructions   ondansetron 4 MG disintegrating tablet Commonly known as:  ZOFRAN ODT Take 1 tablet (4 mg total) by mouth every 6 (six) hours as needed for nausea or vomiting.   vancomycin 1-5 GM/200ML-% Soln Commonly known as:  VANCOCIN Inject 200 mLs (1,000 mg total) into the vein every Monday, Wednesday, and Friday with hemodialysis. For 4 additional weeks, end date 12/29/2015       Allergies  Allergen Reactions  . Atorvastatin Other (See Comments)    Myalgias  . Minoxidil Other (See Comments)    Fluid retention    Consultations:  TCV  IR  Nephrology   Procedures/Studies:  Dg Chest 2 View  Result Date: 12/05/2015 CLINICAL DATA:  Bilateral chest tube removals. EXAM: CHEST  2 VIEW COMPARISON:  12/04/2015. FINDINGS: Mediastinum hilar structures are normal. Cardiomegaly with mild pulmonary vascular prominence and bilateral interstitial prominence. Small bilateral pleural effusions. Findings consistent mild congestive heart failure. Similar finding noted on prior exam. Small air collection noted over left costophrenic angle may represent a small collection of pleural air, this is in position from prior exam . IMPRESSION: 1. Findings again noted consistent with congestive heart failure with mild pulmonary interstitial edema and bilateral pleural effusions. 2. Stable tiny left costophrenic air collection, most likely tiny pleural air collection. No change from prior exam . Electronically Signed   By: Marcello Moores  Register   On: 12/05/2015 09:07   Dg Chest 2 View  Result Date: 12/05/2015 CLINICAL DATA:  45 year old male status post chest tube removal. EXAM: CHEST  2 VIEW COMPARISON:  Chest radiograph dated 12/03/2015 FINDINGS: There has been interval removal of bilateral chest tubes. A small focal lucency at the left lung base and left  costophrenic angle may be artifactual or represent a small residual hyrdopneumothorax with air-fluid level. Clinical correlation and follow-up recommended. There is no pneumothorax on the right. There small bilateral pleural effusions similar to prior radiograph. Small amount of fluid is noted tracking along the right major fissure as well as within the minor fissure. Bilateral mid to lower lung field streaky densities likely combination of atelectatic changes and vascular congestion. Infiltrate is not excluded. Stable cardiac silhouette. No acute osseous pathology. IMPRESSION: Interval removal of bilateral chest tubes. A small area of lucency at the left costophrenic angle may represent air within the pleural surface. Small bilateral pleural effusions with no significant interval change compared to the prior radiograph given the difference in patient positioning. Bilateral mid to lower lung field atelectatic changes versus vascular congestion versus infiltrate. Overall the appearance of line densities are similar or improved since the prior radiograph. Electronically Signed   By: Anner Crete M.D.   On: 12/05/2015 00:05   Dg Chest 2 View  Result Date: 12/02/2015 CLINICAL DATA:  Evaluate chest tube placement EXAM: CHEST  2 VIEW COMPARISON:  12/01/2015 FINDINGS: Bilateral chest tubes are again identified and stable. There is persistent fluid within the fissure on the right causing pseudotumor. Bibasilar atelectatic changes are noted. IMPRESSION: Bibasilar pleural drainage catheters. Persistent fluid is noted within the major fissure on the right. Bibasilar changes remain and are stable from the prior exam. No pneumothorax is noted. Electronically Signed   By: [name] M.D.   On: 12/02/2015 08:52   Dg  Chest 2 View  Result Date: 11/28/2015 CLINICAL DATA:  Aspiration pneumonia EXAM: CHEST  2 VIEW COMPARISON:  11/15/2015 FINDINGS: Progression of right pleural effusion which appears loculated. Possible  developing empyema. There appears to be fluid in the major fissure superiorly as well as in the lung base on the right. Improving right lower lobe infiltrate Progression of left lower lobe infiltrate since the prior study. Possible small left effusion. Negative for heart failure.  No acute skeletal abnormality. IMPRESSION: Loculated right pleural effusion has developed since the prior study and may represent an empyema given the recent history of pneumonia. Chest CT with contrast recommended for further evaluation. Progression of left lower lobe infiltrate which may represent persistent pneumonia. Small left effusion. Electronically Signed   By: Franchot Gallo M.D.   On: 11/28/2015 11:31   Dg Chest 2 View  Result Date: 11/15/2015 CLINICAL DATA:  Nausea and vomiting. History of end-stage renal disease on dialysis. EXAM: CHEST  2 VIEW COMPARISON:  11/14/2015 FINDINGS: Enlargement of the cardiac silhouette is unchanged. The lungs remain hypoinflated with pulmonary vascular congestion and patchy bibasilar opacities which have increased from the prior study. Small bilateral pleural effusions are present. No pneumothorax is seen. No acute osseous abnormality is identified. IMPRESSION: Cardiomegaly with pulmonary vascular congestion and increased bibasilar opacities which may reflect edema or pneumonia. Small pleural effusions. Electronically Signed   By: Logan Bores M.D.   On: 11/15/2015 20:32   Dg Chest 2 View  Result Date: 11/14/2015 CLINICAL DATA:  Three days of fever. Onset of vomiting today. History of aortic stenosis, CHF, diabetes, former smoker EXAM: CHEST  2 VIEW COMPARISON:  Chest x-ray of November 12, 2015 FINDINGS: The lungs are slightly less well inflated today. The interstitial markings remain mildly prominent. There is no alveolar infiltrate. There is no significant pleural effusion though traces of fluid may be blunting the posterior costophrenic angles. The cardiac silhouette is enlarged. The  central pulmonary vascularity is engorged. The trachea is midline. The bony thorax exhibits no acute abnormality. IMPRESSION: Atelectasis or developing pneumonia in the left lower lobe. Superimposed mild CHF. Trace bilateral pleural effusions. Electronically Signed   By: David  Martinique M.D.   On: 11/14/2015 14:43   Dg Chest 2 View  Result Date: 11/12/2015 CLINICAL DATA:  Right rib pain starting yesterday, now moved to the right arm and neck. No injury EXAM: CHEST  2 VIEW COMPARISON:  05/05/2014 FINDINGS: The heart size and mediastinal contours are within normal limits. Both lungs are clear. The visualized skeletal structures are unremarkable. IMPRESSION: No active cardiopulmonary disease. Electronically Signed   By: Lucienne Capers M.D.   On: 11/12/2015 03:52   Dg Shoulder Right  Result Date: 11/12/2015 CLINICAL DATA:  Rib pain starting yesterday, now moving to the right arm and neck. No injury. EXAM: RIGHT SHOULDER - 2+ VIEW COMPARISON:  None. FINDINGS: Degenerative changes in the acromioclavicular and glenohumeral joints with joint space narrowing and osteophyte formation. Subacromial spur formation is present. Decreased subacromial space may indicate chronic rotator cuff arthropathy. No evidence of acute fracture or dislocation of the right shoulder. Coracoclavicular spaces are normal. IMPRESSION: Degenerative changes in the right shoulder. Subacromial spurring with possible rotator cuff arthropathy. No acute bony abnormalities. Electronically Signed   By: Lucienne Capers M.D.   On: 11/12/2015 03:50   Ct Chest W Contrast  Result Date: 11/28/2015 CLINICAL DATA:  Shortness of breath with pneumonia and low grade fever. Abnormal chest radiograph. EXAM: CT CHEST WITH CONTRAST TECHNIQUE: Multidetector CT  imaging of the chest was performed during intravenous contrast administration. CONTRAST:  65mL ISOVUE-300 IOPAMIDOL (ISOVUE-300) INJECTION 61% COMPARISON:  11/28/2015.  11/15/2015.  11/12/2015. FINDINGS:  Cardiovascular: Aortic atherosclerosis. No aneurysm or dissection. Coronary artery atherosclerosis. Heart at the upper limits of normal in size. No pericardial fluid. No large pulmonary arterial filling defects. The study was not done in the form of a pulmonary arterial CTA. Mediastinum/Nodes: No enlarged or low-density nodes. No mediastinal mass. Lungs/Pleura: Bilateral pleural effusions with some loculation. This raises concern for the possibility of empyema. There is pulmonary volume loss in the lower lobes left more than right. Upper lobes are clear. No underlying emphysema. Upper Abdomen: Negative Musculoskeletal: No significant finding. IMPRESSION: Loculated bilateral pleural effusions with associated compressive pulmonary atelectasis in the lower lungs left more than right. The loculated appearance raises the possibility of empyema. This can be seen in persons with chronic pleural scarring. Areas in the lower lobes showing volume loss could be secondary to simple compressive atelectasis or atelectatic pneumonia. Aortic atherosclerosis.  Coronary artery atherosclerosis. Electronically Signed   By: Nelson Chimes M.D.   On: 11/28/2015 17:48   Mr Cervical Spine Wo Contrast  Result Date: 11/13/2015 CLINICAL DATA:  45 year old diabetic hypertensive male with neck pain extending to right shoulder 2 weeks. Anemia. No known injury. EXAM: MRI CERVICAL SPINE WITHOUT CONTRAST TECHNIQUE: Multiplanar, multisequence MR imaging of the cervical spine was performed. No intravenous contrast was administered. COMPARISON:  None. FINDINGS: Alignment: Normal. Vertebrae: Decreased signal intensity of bone marrow may be related to habitus or patient's underlying anemia. No focal osseous abnormality. 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 Posterior Fossa, vertebral arteries, paraspinal tissues: Slightly prominent size left level 2 lymph nodes measuring up to 1.2 cm short axis dimension of  questionable significance/ etiology. Disc levels: C2-3:  Negative. C3-4:  Negative. C4-5: Minimal right uncinate hypertrophy with minimal right foraminal narrowing. C5-6:  Negative. C6-7: Mild bulge/shallow protrusion right paracentral position with slight indentation right ventral thecal sac approaching but not compressing the right ventral nerve root. C7-T1:  Mild facet degenerative changes. IMPRESSION: C6-7 mild bulge/shallow protrusion right paracentral position with slight indentation right ventral thecal sac approaching but not compressing the right ventral nerve root. C4-5 minimal right foraminal narrowing. 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 Decreased signal intense of bone marrow may be related to patient's habitus or underlying anemia. Slightly prominent size left level 2 lymph nodes measuring up to 1.2 cm short axis dimension of questionable significance or etiology. Electronically Signed   By: Genia Del M.D.   On: 11/13/2015 10:05   Mr Shoulder Right Wo Contrast  Result Date: 11/13/2015 CLINICAL DATA:  Neck pain radiating to the right shoulder. Pain for 2 weeks. EXAM: MRI OF THE RIGHT SHOULDER WITHOUT CONTRAST TECHNIQUE: Multiplanar, multisequence MR imaging of the shoulder was performed. No intravenous contrast was administered. COMPARISON:  None. FINDINGS: Rotator cuff: Complete tear of the supraspinatus tendon with 3.5 cm of retraction Infraspinatus tendon is intact. Teres minor tendon is intact. Subscapularis tendon is intact. Muscles: No atrophy or fatty replacement of nor abnormal signal within, the muscles of the rotator cuff. Biceps long head: Mild tendinosis of the intraarticular portion of the long head of the biceps tendon. Acromioclavicular Joint: Mild arthropathy of the acromioclavicular joint. Type I acromion. No subacromial/subdeltoid bursal fluid. Glenohumeral Joint: No joint effusion. No chondral defect. Labrum: Grossly intact, but  evaluation  is limited by lack of intraarticular fluid. Bones: Subcortical reactive marrow changes at the infraspinatus insertion. No other marrow abnormality, fracture or dislocation. Other: None. IMPRESSION: 1. Complete tear of the supraspinatus tendon with 3.5 cm of retraction 2. Mild tendinosis of the intraarticular portion of the long head of the biceps tendon. Electronically Signed   By: Kathreen Devoid   On: 11/13/2015 09:57   Dg Chest Port 1 View  Result Date: 12/03/2015 CLINICAL DATA:  Bilateral pleural effusion EXAM: PORTABLE CHEST 1 VIEW COMPARISON:  12/02/2015 FINDINGS: Cardiomediastinal silhouette is stable. Central mild vascular congestion without convincing pulmonary edema. Hazy bilateral basilar atelectasis or infiltrate. Trace bilateral pleural effusion. Persistent fluid in right major fissure. IMPRESSION: Central mild vascular congestion without convincing pulmonary edema. Hazy bilateral basilar atelectasis or infiltrate. Trace bilateral pleural effusion. Persistent fluid in right major fissure. Electronically Signed   By: [name] M.D.   On: 12/03/2015 09:21   Dg Chest Port 1 View  Result Date: 12/01/2015 CLINICAL DATA:  Bilateral chest tube placement EXAM: PORTABLE CHEST 1 VIEW COMPARISON:  11/30/2015 FINDINGS: Cardiomegaly again noted. Bilateral basal pleural drainage catheter. Mild right basilar atelectasis or infiltrate. Trace left pleural effusion. No pulmonary edema. No pneumothorax. IMPRESSION: Bilateral basal pleural drainage catheter. Mild right basilar atelectasis or infiltrate. Trace left pleural effusion. No pulmonary edema. No pneumothorax. Electronically Signed   By: [name] M.D.   On: 12/01/2015 19:07   Dg Abd 2 Views  Result Date: 11/15/2015 CLINICAL DATA:  Nausea and vomiting EXAM: ABDOMEN - 2 VIEW COMPARISON:  CT 07/31/2015 FINDINGS: No dilated loops of large or small bowel. No pathologic calcifications. Vascular calcification noted in the pelvis. IMPRESSION: No  acute abdominal findings. Electronically Signed   By: Suzy Bouchard M.D.   On: 11/15/2015 20:30   Ct Perc Pleural Drain W/indwell Cath W/img Guide  Result Date: 11/30/2015 INDICATION: History of hospital acquired pneumonia, now with bilateral pleural effusions. Patient has been evaluated by the cardiothoracic surgery service and deemed a poor candidate for VATS procedure. As such, request made for placement of bilateral percutaneous drainage catheter placements. EXAM: 1. CT-GUIDED LEFT-SIDED CHEST TUBE DRAINAGE CATHETER PLACEMENT 2. CT-GUIDED RIGHT-SIDED CHEST TUBE DRAINAGE CATHETER PLACEMENT COMPARISON:  Chest CT - 11/28/2015 MEDICATIONS: The patient is currently admitted to the hospital and receiving intravenous antibiotics. The antibiotics were administered within an appropriate time frame prior to the initiation of the procedure. ANESTHESIA/SEDATION: Moderate (conscious) sedation was employed during this procedure. A total of Versed 1 mg and Fentanyl 50 mcg was administered intravenously. Moderate Sedation Time: 15 minutes. The patient's level of consciousness and vital signs were monitored continuously by radiology nursing throughout the procedure under my direct supervision. CONTRAST:  None COMPLICATIONS: None immediate. PROCEDURE: Informed written consent was obtained from the patient after a discussion of the risks, benefits and alternatives to treatment. The patient was placed supine on the CT gantry and a pre procedural CT was performed re-demonstrating the known partially loculated bilateral pleural effusions. The procedure was planned. A timeout was performed prior to the initiation of the procedure. The skin overlying the inferior lateral aspects of the bilateral lower thoraces were prepped and draped in the usual sterile fashion. Beginning with the left-sided pleural effusion, the overlying soft tissues were anesthetized with 1% lidocaine with epinephrine. Appropriate trajectory was planned  with the use of a 22 gauge spinal needle. An 18 gauge trocar needle was advanced into the abscess/fluid collection and a short Amplatz super stiff wire was coiled within the  collection. Appropriate positioning was confirmed with a limited CT scan. The tract was serially dilated allowing placement of a 12 [place] all-purpose drainage catheter. Appropriate positioning was confirmed with a limited postprocedural CT scan. Approximately 150 cc of serous, amber colored fluid was aspirated following placement of the left-sided chest tube. Attention was now paid towards placement of the right-sided chest tube. The overlying soft tissues were anesthetized with 1% lidocaine with epinephrine. Appropriate trajectory was planned with the use of a 22 gauge spinal needle. An 18 gauge trocar needle was advanced into the abscess/fluid collection and a short Amplatz super stiff wire was coiled within the collection. Appropriate positioning was confirmed with a limited CT scan. The tract was serially dilated allowing placement of a 12 [place] all-purpose drainage catheter. Appropriate positioning was confirmed with a limited postprocedural CT scan. Approximately 700 cc of serous, amber colored fluid was aspirated following placement of the right-sided chest tube. The bilateral chest tubes connected to pleural vac devices and sutured in place. A dressing was placed. The patient tolerated the procedure well without immediate post procedural complication. IMPRESSION: 1. Successful CT guided placement of a 12 [place] all purpose drain catheter into the left pleural space with aspiration of 150 mL of serous, amber color fluid. 2. Successful CT-guided placement of a 34 [place] all-purpose drainage catheter into the right pleural space with aspiration of approximately 700 cc of serous, amber colored fluid. 3. Separate samples from each chest tube were sent to the laboratory as requested by the ordering clinical team. Electronically Signed   By:  Sandi Mariscal M.D.   On: 11/30/2015 14:49   Ct Perc Pleural Drain W/indwell Cath W/img Guide  Result Date: 11/30/2015 INDICATION: History of hospital acquired pneumonia, now with bilateral pleural effusions. Patient has been evaluated by the cardiothoracic surgery service and deemed a poor candidate for VATS procedure. As such, request made for placement of bilateral percutaneous drainage catheter placements. EXAM: 1. CT-GUIDED LEFT-SIDED CHEST TUBE DRAINAGE CATHETER PLACEMENT 2. CT-GUIDED RIGHT-SIDED CHEST TUBE DRAINAGE CATHETER PLACEMENT COMPARISON:  Chest CT - 11/28/2015 MEDICATIONS: The patient is currently admitted to the hospital and receiving intravenous antibiotics. The antibiotics were administered within an appropriate time frame prior to the initiation of the procedure. ANESTHESIA/SEDATION: Moderate (conscious) sedation was employed during this procedure. A total of Versed 1 mg and Fentanyl 50 mcg was administered intravenously. Moderate Sedation Time: 15 minutes. The patient's level of consciousness and vital signs were monitored continuously by radiology nursing throughout the procedure under my direct supervision. CONTRAST:  None COMPLICATIONS: None immediate. PROCEDURE: Informed written consent was obtained from the patient after a discussion of the risks, benefits and alternatives to treatment. The patient was placed supine on the CT gantry and a pre procedural CT was performed re-demonstrating the known partially loculated bilateral pleural effusions. The procedure was planned. A timeout was performed prior to the initiation of the procedure. The skin overlying the inferior lateral aspects of the bilateral lower thoraces were prepped and draped in the usual sterile fashion. Beginning with the left-sided pleural effusion, the overlying soft tissues were anesthetized with 1% lidocaine with epinephrine. Appropriate trajectory was planned with the use of a 22 gauge spinal needle. An 18 gauge trocar  needle was advanced into the abscess/fluid collection and a short Amplatz super stiff wire was coiled within the collection. Appropriate positioning was confirmed with a limited CT scan. The tract was serially dilated allowing placement of a 12 [place] all-purpose drainage catheter. Appropriate positioning was confirmed with a  limited postprocedural CT scan. Approximately 150 cc of serous, amber colored fluid was aspirated following placement of the left-sided chest tube. Attention was now paid towards placement of the right-sided chest tube. The overlying soft tissues were anesthetized with 1% lidocaine with epinephrine. Appropriate trajectory was planned with the use of a 22 gauge spinal needle. An 18 gauge trocar needle was advanced into the abscess/fluid collection and a short Amplatz super stiff wire was coiled within the collection. Appropriate positioning was confirmed with a limited CT scan. The tract was serially dilated allowing placement of a 12 [place] all-purpose drainage catheter. Appropriate positioning was confirmed with a limited postprocedural CT scan. Approximately 700 cc of serous, amber colored fluid was aspirated following placement of the right-sided chest tube. The bilateral chest tubes connected to pleural vac devices and sutured in place. A dressing was placed. The patient tolerated the procedure well without immediate post procedural complication. IMPRESSION: 1. Successful CT guided placement of a 12 [place] all purpose drain catheter into the left pleural space with aspiration of 150 mL of serous, amber color fluid. 2. Successful CT-guided placement of a 61 [place] all-purpose drainage catheter into the right pleural space with aspiration of approximately 700 cc of serous, amber colored fluid. 3. Separate samples from each chest tube were sent to the laboratory as requested by the ordering clinical team. Electronically Signed   By: Sandi Mariscal M.D.   On: 11/30/2015 14:49      Subjective: - no chest pain, shortness of breath, no abdominal pain, nausea or vomiting.   Discharge Exam: Vitals:   12/04/15 1615 12/05/15 0414  BP: (!) 148/82 116/70  Pulse: 100 94  Resp: 18 17  Temp: 98.3 F (36.8 C) 98.6 F (37 C)   Vitals:   12/04/15 1530 12/04/15 1600 12/04/15 1615 12/05/15 0414  BP: (!) 156/89 (!) 155/97 (!) 148/82 116/70  Pulse: 100 100 100 94  Resp:   18 17  Temp:   98.3 F (36.8 C) 98.6 F (37 C)  TempSrc:   Oral Oral  SpO2:   99% 99%  Weight:   96.9 kg (213 lb 10 oz) 96.9 kg (213 lb 11.2 oz)  Height:        General: Pt is alert, awake, not in acute distress Cardiovascular: RRR, S1/S2 +, no rubs, no gallops Respiratory: CTA bilaterally, no wheezing, no rhonchi Abdominal: Soft, NT, ND, bowel sounds + Extremities: no edema, no cyanosis    The results of significant diagnostics from this hospitalization (including imaging, microbiology, ancillary and laboratory) are listed below for reference.     Microbiology: Recent Results (from the past 240 hour(s))  Culture, blood (single)     Status: None   Collection Time: 11/28/15 11:05 AM  Result Value Ref Range Status   Specimen Description BLOOD LEFT HAND  Final   Special Requests   Final    BOTTLES DRAWN AEROBIC AND ANAEROBIC AEB=7CC ANA=6CC   Culture NO GROWTH 5 DAYS  Final   Report Status 12/03/2015 FINAL  Final  Blood culture (routine x 2)     Status: None   Collection Time: 11/28/15  4:18 PM  Result Value Ref Range Status   Specimen Description BLOOD RIGHT FOREARM  Final   Special Requests   Final    BOTTLES DRAWN AEROBIC AND ANAEROBIC AEB=8CC ANA=6CC   Culture NO GROWTH 5 DAYS  Final   Report Status 12/03/2015 FINAL  Final  Blood culture (routine x 2)     Status: None  Collection Time: 11/28/15  4:24 PM  Result Value Ref Range Status   Specimen Description BLOOD LEFT HAND  Final   Special Requests   Final    BOTTLES DRAWN AEROBIC AND ANAEROBIC AEB=6CC ANA=5CC   Culture NO  GROWTH 5 DAYS  Final   Report Status 12/03/2015 FINAL  Final  Culture, body fluid-bottle     Status: None (Preliminary result)   Collection Time: 11/30/15  2:25 PM  Result Value Ref Range Status   Specimen Description FLUID RIGHT PLEURAL  Final   Special Requests BOTTLES DRAWN AEROBIC AND ANAEROBIC 10CC  Final   Culture NO GROWTH 4 DAYS  Final   Report Status PENDING  Incomplete  Gram stain     Status: None   Collection Time: 11/30/15  2:25 PM  Result Value Ref Range Status   Specimen Description FLUID RIGHT PLEURAL  Final   Special Requests NONE  Final   Gram Stain   Final    WBC PRESENT,BOTH PMN AND MONONUCLEAR NO ORGANISMS SEEN CYTOSPIN SMEAR    Report Status 11/30/2015 FINAL  Final  Culture, body fluid-bottle     Status: None (Preliminary result)   Collection Time: 11/30/15  2:26 PM  Result Value Ref Range Status   Specimen Description FLUID LEFT PLEURAL  Final   Special Requests BOTTLES DRAWN AEROBIC AND ANAEROBIC 10CC  Final   Culture NO GROWTH 3 DAYS  Final   Report Status PENDING  Incomplete  Gram stain     Status: None   Collection Time: 11/30/15  2:26 PM  Result Value Ref Range Status   Specimen Description FLUID LEFT PLEURAL  Final   Special Requests NONE  Final   Gram Stain   Final    WBC PRESENT,BOTH PMN AND MONONUCLEAR NO ORGANISMS SEEN CYTOSPIN SMEAR    Report Status 11/30/2015 FINAL  Final     Labs: BNP (last 3 results) No results for input(s): BNP in the last 8760 hours. Basic Metabolic Panel:  Recent Labs Lab 11/29/15 1258 11/30/15 0237 12/01/15 0713 12/02/15 0423 12/03/15 0246 12/04/15 1230  NA 131* 131* 127* 130* 133* 131*  K 5.0 4.7 4.6 4.5 4.2 4.8  CL 91* 91* 90* 90* 94* 94*  CO2 26 27 21* 27 26 24   GLUCOSE 239* 381* 345* 355* 246* 177*  BUN 44* 26* 40* 25* 19 33*  CREATININE 12.16* 8.98* 12.10* 8.19* 7.23* 11.01*  CALCIUM 8.5* 8.2* 8.6* 8.3* 8.8* 8.8*  PHOS 6.3*  --  8.7*  --   --  6.0*   Liver Function Tests:  Recent Labs Lab  11/28/15 1618 11/29/15 1258 12/01/15 0713 12/04/15 1230  AST 15  --   --  16  ALT 14*  --   --  12*  ALKPHOS 119  --   --  85  BILITOT 1.3*  --   --  0.9  PROT 7.9  --   --  7.6  ALBUMIN 2.5* 2.2* 2.2* 2.1*   No results for input(s): LIPASE, AMYLASE in the last 168 hours. No results for input(s): AMMONIA in the last 168 hours. CBC:  Recent Labs Lab 11/28/15 1105 11/28/15 1618 11/29/15 1258 11/30/15 0237 12/01/15 0712 12/03/15 0246 12/04/15 1230  WBC 13.7* 12.9* 11.7* 11.0* 9.9 9.1 8.4  NEUTROABS 9.3* 8.6*  --   --   --   --   --   HGB 10.7* 10.8* 10.2* 10.3* 10.1* 10.4* 10.1*  HCT 32.2* 32.0* 30.7* 30.4* 29.6* 31.0* 29.6*  MCV 96.7 96.7  93.3 93.3 92.8 92.5 92.2  PLT 589* 538* 556* 535* 518* 439* 460*   Cardiac Enzymes: No results for input(s): CKTOTAL, CKMB, CKMBINDEX, TROPONINI in the last 168 hours. BNP: Invalid input(s): POCBNP CBG:  Recent Labs Lab 12/04/15 1116 12/04/15 1858 12/04/15 2128 12/05/15 0406 12/05/15 0622  GLUCAP 170* 306* 490* 133* 114*   D-Dimer No results for input(s): DDIMER in the last 72 hours. Hgb A1c No results for input(s): HGBA1C in the last 72 hours. Lipid Profile No results for input(s): CHOL, HDL, LDLCALC, TRIG, CHOLHDL, LDLDIRECT in the last 72 hours. Thyroid function studies No results for input(s): TSH, T4TOTAL, T3FREE, THYROIDAB in the last 72 hours.  Invalid input(s): FREET3 Anemia work up  Recent Labs  12/04/15 1230  TIBC 139*  IRON 22*   Urinalysis    Component Value Date/Time   COLORURINE YELLOW 05/05/2014 [place] 05/05/2014 0904   LABSPEC 1.020 05/05/2014 0904   PHURINE 6.5 05/05/2014 0904   GLUCOSEU 100 (A) 05/05/2014 0904   HGBUR LARGE (A) 05/05/2014 0904   BILIRUBINUR NEGATIVE 05/05/2014 0904   KETONESUR NEGATIVE 05/05/2014 0904   PROTEINUR >300 (A) 05/05/2014 0904   UROBILINOGEN 0.2 05/05/2014 0904   NITRITE NEGATIVE 05/05/2014 0904   LEUKOCYTESUR NEGATIVE 05/05/2014 0904    Sepsis Labs Invalid input(s): PROCALCITONIN,  WBC,  LACTICIDVEN Microbiology Recent Results (from the past 240 hour(s))  Culture, blood (single)     Status: None   Collection Time: 11/28/15 11:05 AM  Result Value Ref Range Status   Specimen Description BLOOD LEFT HAND  Final   Special Requests   Final    BOTTLES DRAWN AEROBIC AND ANAEROBIC AEB=7CC ANA=6CC   Culture NO GROWTH 5 DAYS  Final   Report Status 12/03/2015 FINAL  Final  Blood culture (routine x 2)     Status: None   Collection Time: 11/28/15  4:18 PM  Result Value Ref Range Status   Specimen Description BLOOD RIGHT FOREARM  Final   Special Requests   Final    BOTTLES DRAWN AEROBIC AND ANAEROBIC AEB=8CC ANA=6CC   Culture NO GROWTH 5 DAYS  Final   Report Status 12/03/2015 FINAL  Final  Blood culture (routine x 2)     Status: None   Collection Time: 11/28/15  4:24 PM  Result Value Ref Range Status   Specimen Description BLOOD LEFT HAND  Final   Special Requests   Final    BOTTLES DRAWN AEROBIC AND ANAEROBIC AEB=6CC ANA=5CC   Culture NO GROWTH 5 DAYS  Final   Report Status 12/03/2015 FINAL  Final  Culture, body fluid-bottle     Status: None (Preliminary result)   Collection Time: 11/30/15  2:25 PM  Result Value Ref Range Status   Specimen Description FLUID RIGHT PLEURAL  Final   Special Requests BOTTLES DRAWN AEROBIC AND ANAEROBIC 10CC  Final   Culture NO GROWTH 4 DAYS  Final   Report Status PENDING  Incomplete  Gram stain     Status: None   Collection Time: 11/30/15  2:25 PM  Result Value Ref Range Status   Specimen Description FLUID RIGHT PLEURAL  Final   Special Requests NONE  Final   Gram Stain   Final    WBC PRESENT,BOTH PMN AND MONONUCLEAR NO ORGANISMS SEEN CYTOSPIN SMEAR    Report Status 11/30/2015 FINAL  Final  Culture, body fluid-bottle     Status: None (Preliminary result)   Collection Time: 11/30/15  2:26 PM  Result Value Ref Range Status  Specimen Description FLUID LEFT PLEURAL  Final   Special  Requests BOTTLES DRAWN AEROBIC AND ANAEROBIC 10CC  Final   Culture NO GROWTH 3 DAYS  Final   Report Status PENDING  Incomplete  Gram stain     Status: None   Collection Time: 11/30/15  2:26 PM  Result Value Ref Range Status   Specimen Description FLUID LEFT PLEURAL  Final   Special Requests NONE  Final   Gram Stain   Final    WBC PRESENT,BOTH PMN AND MONONUCLEAR NO ORGANISMS SEEN CYTOSPIN SMEAR    Report Status 11/30/2015 FINAL  Final     Time coordinating discharge: Over 30 minutes  SIGNED:  Marzetta Board, MD [facility] 12/05/2015, 9:50 AM Pager [phone]  If 7PM-7AM, please contact night-coverage [email or web address] Password [email or web address]

## 2015-12-04 NOTE — Progress Notes (Signed)
Subjective: Interval History: has complaints ready to go home , feeling better.  Objective: Vital signs in last 24 hours: Temp:  [98 F (36.7 C)-98.6 F (37 C)] 98 F (36.7 C) (10/23 0409) Pulse Rate:  [92-98] 96 (10/23 0409) Resp:  [18-20] 20 (10/23 0409) BP: (131-154)/(73-82) 154/82 (10/23 0409) SpO2:  [99 %-100 %] 99 % (10/23 0409) Weight:  [98.8 kg (217 lb 12.8 oz)] 98.8 kg (217 lb 12.8 oz) (10/23 0409) Weight change: -0.207 kg (-7.3 oz)  Intake/Output from previous day: 10/22 0701 - 10/23 0700 In: -  Out: 28 [Chest Tube:28] Intake/Output this shift: No intake/output data recorded.  General appearance: alert, cooperative and no distress Resp: rales bibasilar Chest wall: R & L sided CT Cardio: S1, S2 normal and systolic murmur: holosystolic 2/6, blowing and 2n M blowing and Gr 2/6 SEM ULSB GI: liver down 5 cm Extremities: AVF on L  Lab Results:  Recent Labs  12/01/15 0712 12/03/15 0246  WBC 9.9 9.1  HGB 10.1* 10.4*  HCT 29.6* 31.0*  PLT 518* 439*   BMET:  Recent Labs  12/02/15 0423 12/03/15 0246  NA 130* 133*  K 4.5 4.2  CL 90* 94*  CO2 27 26  GLUCOSE 355* 246*  BUN 25* 19  CREATININE 8.19* 7.23*  CALCIUM 8.3* 8.8*   No results for input(s): PTH in the last 72 hours. Iron Studies: No results for input(s): IRON, TIBC, TRANSFERRIN, FERRITIN in the last 72 hours.  Studies/Results: Dg Chest 2 View  Result Date: 12/02/2015 CLINICAL DATA:  Evaluate chest tube placement EXAM: CHEST  2 VIEW COMPARISON:  12/01/2015 FINDINGS: Bilateral chest tubes are again identified and stable. There is persistent fluid within the fissure on the right causing pseudotumor. Bibasilar atelectatic changes are noted. IMPRESSION: Bibasilar pleural drainage catheters. Persistent fluid is noted within the major fissure on the right. Bibasilar changes remain and are stable from the prior exam. No pneumothorax is noted. Electronically Signed   By: [name] M.D.   On: 12/02/2015  08:52   Dg Chest Port 1 View  Result Date: 12/03/2015 CLINICAL DATA:  Bilateral pleural effusion EXAM: PORTABLE CHEST 1 VIEW COMPARISON:  12/02/2015 FINDINGS: Cardiomediastinal silhouette is stable. Central mild vascular congestion without convincing pulmonary edema. Hazy bilateral basilar atelectasis or infiltrate. Trace bilateral pleural effusion. Persistent fluid in right major fissure. IMPRESSION: Central mild vascular congestion without convincing pulmonary edema. Hazy bilateral basilar atelectasis or infiltrate. Trace bilateral pleural effusion. Persistent fluid in right major fissure. Electronically Signed   By: [name] M.D.   On: 12/03/2015 09:21    I have reviewed the patient's current medications.  Assessment/Plan: 1 ESRD for HD.  Vol xs mild.  Needs monthly labs 2 Anemia start ESA, check Fe 3 HPTH. Check PTH 4 DM controlled 5 Effusions ?? Infected, on Vanco, drained 6 HTN not an issue P HD, ESA, check Fe, PTH    LOS: 6 days   Lance [name] L 12/04/2015,6:59 AM

## 2015-12-04 NOTE — Progress Notes (Signed)
      [name]       [place] [address]             [phone]      Subjective:  No complaints wants to go home.  Objective: Vital signs in last 24 hours: Temp:  [98 F (36.7 C)-98.6 F (37 C)] 98 F (36.7 C) (10/23 0409) Pulse Rate:  [92-98] 96 (10/23 0409) Cardiac Rhythm: Normal sinus rhythm (10/23 0700) Resp:  [18-20] 20 (10/23 0409) BP: (131-154)/(73-82) 154/82 (10/23 0409) SpO2:  [99 %-100 %] 99 % (10/23 0409) Weight:  [217 lb 12.8 oz (98.8 kg)] 217 lb 12.8 oz (98.8 kg) (10/23 0409)  Intake/Output from previous day: 10/22 0701 - 10/23 0700 In: -  Out: 28 [Chest Tube:28]  General appearance: alert, cooperative and no distress Heart: regular rate and rhythm Lungs: diminished breath sounds bibasilar Abdomen: soft, non-tender; bowel sounds normal; no masses,  no organomegaly Wound: clean and dry  Lab Results:  Recent Labs  12/03/15 0246  WBC 9.1  HGB 10.4*  HCT 31.0*  PLT 439*   BMET:  Recent Labs  12/02/15 0423 12/03/15 0246  NA 130* 133*  K 4.5 4.2  CL 90* 94*  CO2 27 26  GLUCOSE 355* 246*  BUN 25* 19  CREATININE 8.19* 7.23*  CALCIUM 8.3* 8.8*    PT/INR: No results for input(s): LABPROT, INR in the last 72 hours. ABG No results found for: PHART, HCO3, TCO2, ACIDBASEDEF, O2SAT CBG (last 3)   Recent Labs  12/03/15 1617 12/03/15 2034 12/04/15 0643  GLUCAP 132* 220* 400*    Assessment/Plan:  1. Chest tube- no output yesterday from right or left tube, no air leak... CXR shows improvement of pleural effusions- will d/c chest tubes today if ok with staff 2. ID- remains afebrile, cultures remain negative to date 3. Dispo- patient stable, will d/c chest tubes today.. Patient wants to go home today.. We can get a CXR this afternoon if remains stable would be okay from our standpoint to discharge   LOS: 6 days    Ahmed Prima, [facility] 12/04/2015

## 2015-12-05 ENCOUNTER — Inpatient Hospital Stay (HOSPITAL_COMMUNITY): Payer: Medicare Other

## 2015-12-05 LAB — GLUCOSE, CAPILLARY
GLUCOSE-CAPILLARY: 133 mg/dL — AB (ref 65–99)
GLUCOSE-CAPILLARY: 114 mg/dL — AB (ref 65–99)

## 2015-12-05 LAB — CULTURE, BODY FLUID-BOTTLE

## 2015-12-05 LAB — PARATHYROID HORMONE, INTACT (NO CA): PTH: 195 pg/mL — AB (ref 15–65)

## 2015-12-05 LAB — CULTURE, BODY FLUID W GRAM STAIN -BOTTLE: Culture: NO GROWTH

## 2015-12-05 LAB — PH, BODY FLUID: PH, BODY FLUID: 7.8

## 2015-12-05 MED ORDER — SODIUM CHLORIDE 0.9 % IV SOLN: 125.0000 mg | INTRAVENOUS | Status: DC

## 2015-12-05 NOTE — Progress Notes (Signed)
Spoke with patient around 9am this morning to tell him that paperwork for discharge was not ready yet. I paged Gherge MD to reconcile meds which when I went to his room he was not there. No discharge paperwork had been given or reviewed. CN-Nego notified.  I spoke with MD who said to shred the Rx.

## 2015-12-05 NOTE — Care Management Important Message (Signed)
Important Message  Patient Details  Name: Lance [name] MRN: [id number] Date of Birth: [birth year]   Medicare Important Message Given:  Yes    [name] 12/05/2015, 12:52 PM

## 2015-12-05 NOTE — Progress Notes (Signed)
Subjective: Interval History: has complaints wants to go home.  Objective: Vital signs in last 24 hours: Temp:  [98.1 F (36.7 C)-98.6 F (37 C)] 98.6 F (37 C) (10/24 0414) Pulse Rate:  [87-100] 94 (10/24 0414) Resp:  [17-18] 17 (10/24 0414) BP: (116-168)/(70-105) 116/70 (10/24 0414) SpO2:  [98 %-99 %] 99 % (10/24 0414) Weight:  [96.9 kg (213 lb 10 oz)-99.4 kg (219 lb 2.2 oz)] 96.9 kg (213 lb 11.2 oz) (10/24 0414) Weight change: 0.607 kg (1 lb 5.4 oz)  Intake/Output from previous day: 10/23 0701 - 10/24 0700 In: 690 [P.O.:690] Out: 2500  Intake/Output this shift: No intake/output data recorded.  General appearance: alert, cooperative and no distress Resp: rales bibasilar Cardio: S1, S2 normal and systolic murmur: systolic ejection 2/6, decrescendo at 2nd left intercostal space GI: soft, non-tender; bowel sounds normal; no masses,  no organomegaly Extremities: AVF RUA ,B&T  Lab Results:  Recent Labs  12/03/15 0246 12/04/15 1230  WBC 9.1 8.4  HGB 10.4* 10.1*  HCT 31.0* 29.6*  PLT 439* 460*   BMET:  Recent Labs  12/03/15 0246 12/04/15 1230  NA 133* 131*  K 4.2 4.8  CL 94* 94*  CO2 26 24  GLUCOSE 246* 177*  BUN 19 33*  CREATININE 7.23* 11.01*  CALCIUM 8.8* 8.8*   No results for input(s): PTH in the last 72 hours. Iron Studies:  Recent Labs  12/04/15 1230  IRON 22*  TIBC 139*    Studies/Results: Dg Chest 2 View  Result Date: 12/05/2015 CLINICAL DATA:  45 year old male status post chest tube removal. EXAM: CHEST  2 VIEW COMPARISON:  Chest radiograph dated 12/03/2015 FINDINGS: There has been interval removal of bilateral chest tubes. A small focal lucency at the left lung base and left costophrenic angle may be artifactual or represent a small residual hyrdopneumothorax with air-fluid level. Clinical correlation and follow-up recommended. There is no pneumothorax on the right. There small bilateral pleural effusions similar to prior radiograph. Small  amount of fluid is noted tracking along the right major fissure as well as within the minor fissure. Bilateral mid to lower lung field streaky densities likely combination of atelectatic changes and vascular congestion. Infiltrate is not excluded. Stable cardiac silhouette. No acute osseous pathology. IMPRESSION: Interval removal of bilateral chest tubes. A small area of lucency at the left costophrenic angle may represent air within the pleural surface. Small bilateral pleural effusions with no significant interval change compared to the prior radiograph given the difference in patient positioning. Bilateral mid to lower lung field atelectatic changes versus vascular congestion versus infiltrate. Overall the appearance of line densities are similar or improved since the prior radiograph. Electronically Signed   By: Anner Crete M.D.   On: 12/05/2015 00:05   Dg Chest Port 1 View  Result Date: 12/03/2015 CLINICAL DATA:  Bilateral pleural effusion EXAM: PORTABLE CHEST 1 VIEW COMPARISON:  12/02/2015 FINDINGS: Cardiomediastinal silhouette is stable. Central mild vascular congestion without convincing pulmonary edema. Hazy bilateral basilar atelectasis or infiltrate. Trace bilateral pleural effusion. Persistent fluid in right major fissure. IMPRESSION: Central mild vascular congestion without convincing pulmonary edema. Hazy bilateral basilar atelectasis or infiltrate. Trace bilateral pleural effusion. Persistent fluid in right major fissure. Electronically Signed   By: [name] M.D.   On: 12/03/2015 09:21    I have reviewed the patient's current medications.  Assessment/Plan: 1 ESRD still some vol xs on CXR  Will slowly lower dry 2 HTN better with lower vol 3 Pneu/??loculated effusions CTs out,   ?  Duration of AB 4 DM volatile 5 Anemia cont esa, needs Fe 6 HPTH vit D and cinnacalcet P HD in am, see about length of AB, Fe,     LOS: 7 days   Vikram Tillett L 12/05/2015,7:08 AM

## 2015-12-05 NOTE — Progress Notes (Signed)
      [address]       [place] [address]             [phone]         Subjective:  Wants to go home.  Objective: Vital signs in last 24 hours: Temp:  [98.1 F (36.7 C)-98.6 F (37 C)] 98.6 F (37 C) (10/24 0414) Pulse Rate:  [87-100] 94 (10/24 0414) Cardiac Rhythm: Sinus tachycardia (10/23 2010) Resp:  [17-18] 17 (10/24 0414) BP: (116-168)/(70-105) 116/70 (10/24 0414) SpO2:  [98 %-99 %] 99 % (10/24 0414) Weight:  [213 lb 10 oz (96.9 kg)-219 lb 2.2 oz (99.4 kg)] 213 lb 11.2 oz (96.9 kg) (10/24 0414)  Intake/Output from previous day: 10/23 0701 - 10/24 0700 In: 690 [P.O.:690] Out: 2500   General appearance: alert, cooperative and no distress Heart: regular rate and rhythm Lungs: diminished breath sounds bibasilar Abdomen: soft, non-tender; bowel sounds normal; no masses,  no organomegaly Wound: clean and dry  Lab Results:  Recent Labs  12/03/15 0246 12/04/15 1230  WBC 9.1 8.4  HGB 10.4* 10.1*  HCT 31.0* 29.6*  PLT 439* 460*   BMET:  Recent Labs  12/03/15 0246 12/04/15 1230  NA 133* 131*  K 4.2 4.8  CL 94* 94*  CO2 26 24  GLUCOSE 246* 177*  BUN 19 33*  CREATININE 7.23* 11.01*  CALCIUM 8.8* 8.8*    PT/INR: No results for input(s): LABPROT, INR in the last 72 hours. ABG No results found for: PHART, HCO3, TCO2, ACIDBASEDEF, O2SAT CBG (last 3)   Recent Labs  12/04/15 2128 12/05/15 0406 12/05/15 0622  GLUCAP 490* 133* 114*    Assessment/Plan:  1. Pulm- chest tubes removed yesterday... CXR without pneumothorax, small residual pleural effusion on right side 2. Dispo- ok to d/c home per our standpoint, care per primary   LOS: 7 days    Ahmed Prima, [facility] 12/05/2015

## 2015-12-05 NOTE — Progress Notes (Signed)
The pt's blood glucose had risen from 306 to 490 this evening.  NP was informed and ordered a one time Novolog 15 Units and a BMP lab draw.  The pt refused to have the BMP done but accepted the Novolog along with his nightly Lantus 24 Units.  Will continue to monitor pt. Lupita Dawn, RN

## 2015-12-06 DIAGNOSIS — N186 End stage renal disease: Secondary | ICD-10-CM | POA: Diagnosis not present

## 2015-12-06 LAB — CULTURE, BODY FLUID-BOTTLE: CULTURE: NO GROWTH

## 2015-12-06 LAB — CULTURE, BODY FLUID W GRAM STAIN -BOTTLE

## 2015-12-08 DIAGNOSIS — N186 End stage renal disease: Secondary | ICD-10-CM | POA: Diagnosis not present

## 2015-12-11 DIAGNOSIS — N186 End stage renal disease: Secondary | ICD-10-CM | POA: Diagnosis not present

## 2015-12-12 DIAGNOSIS — N186 End stage renal disease: Secondary | ICD-10-CM | POA: Diagnosis not present

## 2015-12-12 DIAGNOSIS — E1022 Type 1 diabetes mellitus with diabetic chronic kidney disease: Secondary | ICD-10-CM | POA: Diagnosis not present

## 2015-12-12 DIAGNOSIS — Z992 Dependence on renal dialysis: Secondary | ICD-10-CM | POA: Diagnosis not present

## 2015-12-13 DIAGNOSIS — N186 End stage renal disease: Secondary | ICD-10-CM | POA: Diagnosis not present

## 2015-12-15 DIAGNOSIS — N186 End stage renal disease: Secondary | ICD-10-CM | POA: Diagnosis not present

## 2015-12-18 DIAGNOSIS — N186 End stage renal disease: Secondary | ICD-10-CM | POA: Diagnosis not present

## 2015-12-20 DIAGNOSIS — N186 End stage renal disease: Secondary | ICD-10-CM | POA: Diagnosis not present

## 2015-12-22 DIAGNOSIS — N186 End stage renal disease: Secondary | ICD-10-CM | POA: Diagnosis not present

## 2015-12-25 DIAGNOSIS — N186 End stage renal disease: Secondary | ICD-10-CM | POA: Diagnosis not present

## 2015-12-27 DIAGNOSIS — N186 End stage renal disease: Secondary | ICD-10-CM | POA: Diagnosis not present

## 2015-12-29 DIAGNOSIS — N186 End stage renal disease: Secondary | ICD-10-CM | POA: Diagnosis not present

## 2015-12-31 DIAGNOSIS — N186 End stage renal disease: Secondary | ICD-10-CM | POA: Diagnosis not present

## 2016-01-02 DIAGNOSIS — N186 End stage renal disease: Secondary | ICD-10-CM | POA: Diagnosis not present

## 2016-01-05 DIAGNOSIS — N186 End stage renal disease: Secondary | ICD-10-CM | POA: Diagnosis not present

## 2016-01-08 DIAGNOSIS — N186 End stage renal disease: Secondary | ICD-10-CM | POA: Diagnosis not present

## 2016-01-10 DIAGNOSIS — N186 End stage renal disease: Secondary | ICD-10-CM | POA: Diagnosis not present

## 2016-01-11 DIAGNOSIS — N186 End stage renal disease: Secondary | ICD-10-CM | POA: Diagnosis not present

## 2016-01-11 DIAGNOSIS — E1022 Type 1 diabetes mellitus with diabetic chronic kidney disease: Secondary | ICD-10-CM | POA: Diagnosis not present

## 2016-01-11 DIAGNOSIS — Z992 Dependence on renal dialysis: Secondary | ICD-10-CM | POA: Diagnosis not present

## 2016-01-12 DIAGNOSIS — N186 End stage renal disease: Secondary | ICD-10-CM | POA: Diagnosis not present

## 2016-01-15 DIAGNOSIS — N186 End stage renal disease: Secondary | ICD-10-CM | POA: Diagnosis not present

## 2016-01-17 DIAGNOSIS — N186 End stage renal disease: Secondary | ICD-10-CM | POA: Diagnosis not present

## 2016-01-19 DIAGNOSIS — N186 End stage renal disease: Secondary | ICD-10-CM | POA: Diagnosis not present

## 2016-01-22 DIAGNOSIS — N186 End stage renal disease: Secondary | ICD-10-CM | POA: Diagnosis not present

## 2016-01-24 DIAGNOSIS — N186 End stage renal disease: Secondary | ICD-10-CM | POA: Diagnosis not present

## 2016-01-26 DIAGNOSIS — N186 End stage renal disease: Secondary | ICD-10-CM | POA: Diagnosis not present

## 2016-01-29 DIAGNOSIS — N186 End stage renal disease: Secondary | ICD-10-CM | POA: Diagnosis not present

## 2016-01-31 DIAGNOSIS — N186 End stage renal disease: Secondary | ICD-10-CM | POA: Diagnosis not present

## 2016-02-02 DIAGNOSIS — N186 End stage renal disease: Secondary | ICD-10-CM | POA: Diagnosis not present

## 2016-02-04 DIAGNOSIS — N186 End stage renal disease: Secondary | ICD-10-CM | POA: Diagnosis not present

## 2016-02-07 DIAGNOSIS — N186 End stage renal disease: Secondary | ICD-10-CM | POA: Diagnosis not present

## 2016-02-09 DIAGNOSIS — N186 End stage renal disease: Secondary | ICD-10-CM | POA: Diagnosis not present

## 2016-02-11 DIAGNOSIS — N186 End stage renal disease: Secondary | ICD-10-CM | POA: Diagnosis not present

## 2016-02-11 DIAGNOSIS — E1022 Type 1 diabetes mellitus with diabetic chronic kidney disease: Secondary | ICD-10-CM | POA: Diagnosis not present

## 2016-02-11 DIAGNOSIS — Z992 Dependence on renal dialysis: Secondary | ICD-10-CM | POA: Diagnosis not present

## 2016-02-14 DIAGNOSIS — N186 End stage renal disease: Secondary | ICD-10-CM | POA: Diagnosis not present

## 2016-02-16 DIAGNOSIS — N186 End stage renal disease: Secondary | ICD-10-CM | POA: Diagnosis not present

## 2016-02-19 DIAGNOSIS — N186 End stage renal disease: Secondary | ICD-10-CM | POA: Diagnosis not present

## 2016-02-19 DIAGNOSIS — R197 Diarrhea, unspecified: Secondary | ICD-10-CM | POA: Diagnosis not present

## 2016-02-19 DIAGNOSIS — N2581 Secondary hyperparathyroidism of renal origin: Secondary | ICD-10-CM | POA: Diagnosis not present

## 2016-02-21 DIAGNOSIS — N186 End stage renal disease: Secondary | ICD-10-CM | POA: Diagnosis not present

## 2016-02-21 DIAGNOSIS — N2581 Secondary hyperparathyroidism of renal origin: Secondary | ICD-10-CM | POA: Diagnosis not present

## 2016-02-21 DIAGNOSIS — R197 Diarrhea, unspecified: Secondary | ICD-10-CM | POA: Diagnosis not present

## 2016-02-23 DIAGNOSIS — N186 End stage renal disease: Secondary | ICD-10-CM | POA: Diagnosis not present

## 2016-02-23 DIAGNOSIS — R197 Diarrhea, unspecified: Secondary | ICD-10-CM | POA: Diagnosis not present

## 2016-02-23 DIAGNOSIS — N2581 Secondary hyperparathyroidism of renal origin: Secondary | ICD-10-CM | POA: Diagnosis not present

## 2016-02-26 DIAGNOSIS — N186 End stage renal disease: Secondary | ICD-10-CM | POA: Diagnosis not present

## 2016-02-26 DIAGNOSIS — N2581 Secondary hyperparathyroidism of renal origin: Secondary | ICD-10-CM | POA: Diagnosis not present

## 2016-02-27 ENCOUNTER — Encounter: Payer: Self-pay | Admitting: Family Medicine

## 2016-02-27 ENCOUNTER — Ambulatory Visit (INDEPENDENT_AMBULATORY_CARE_PROVIDER_SITE_OTHER): Payer: BLUE CROSS/BLUE SHIELD | Admitting: Family Medicine

## 2016-02-27 VITALS — BP 132/84 | Temp 99.2°F | Ht 75.0 in | Wt 217.0 lb

## 2016-02-27 DIAGNOSIS — R112 Nausea with vomiting, unspecified: Secondary | ICD-10-CM

## 2016-02-27 DIAGNOSIS — R05 Cough: Secondary | ICD-10-CM | POA: Diagnosis not present

## 2016-02-27 DIAGNOSIS — R059 Cough, unspecified: Secondary | ICD-10-CM

## 2016-02-27 DIAGNOSIS — R509 Fever, unspecified: Secondary | ICD-10-CM | POA: Diagnosis not present

## 2016-02-27 MED ORDER — AMOXICILLIN-POT CLAVULANATE 875-125 MG PO TABS: 1.0000 | ORAL_TABLET | Freq: Two times a day (BID) | ORAL | 0 refills | Status: DC

## 2016-02-27 MED ORDER — ONDANSETRON 4 MG PO TBDP: 4.0000 mg | ORAL_TABLET | Freq: Four times a day (QID) | ORAL | 2 refills | Status: DC | PRN

## 2016-02-27 MED ORDER — CEFTRIAXONE SODIUM 1 G IJ SOLR
500.0000 mg | Freq: Once | INTRAMUSCULAR | Status: AC
  Administered 2016-02-27: 500 mg via INTRAMUSCULAR

## 2016-02-27 NOTE — Progress Notes (Signed)
   Subjective:    Patient ID: Lance [name], male    DOB: 02-29-1970, 46 y.o.   MRN: [id number]  Cough  This is a new problem. Episode onset: one week. Associated symptoms comments: Congestion, low grade fever. Treatments tried: alka seltzer cold plus.   Started coughing this past weekend, but no phlegm  Low grade fever  dialyis 98.2  Nausea and vom with diarhea  Flu shot given  Cough started sat  vom on Sunday  enery level ok   Notes nusea after dialysis, PA though might be due to sensipar   Nausea ongoing for awhile after dialysis.   Review of Systems  Respiratory: Positive for cough.   No fever     Objective:   Physical Exam Alert active mild malaise good hydration lungs bronchial cough no wheezes no crackles no tachypnea heart regular in rhythm. Abdomen benign       Assessment & Plan:  Impression viral syndrome/bronchitis. Doubt pneumonia though need to be concerned this patient had challenges with pneumonia last fall. Plan to be aggressive with management. Plan Rocephin injection. Augmentin will maintain 875 twice a day with receiving dialysis every 2 d warning signs discussed carefully due to see his nephrologist for dialysis later this week

## 2016-02-28 DIAGNOSIS — N186 End stage renal disease: Secondary | ICD-10-CM | POA: Diagnosis not present

## 2016-02-28 DIAGNOSIS — N2581 Secondary hyperparathyroidism of renal origin: Secondary | ICD-10-CM | POA: Diagnosis not present

## 2016-03-01 DIAGNOSIS — N186 End stage renal disease: Secondary | ICD-10-CM | POA: Diagnosis not present

## 2016-03-01 DIAGNOSIS — N2581 Secondary hyperparathyroidism of renal origin: Secondary | ICD-10-CM | POA: Diagnosis not present

## 2016-03-04 DIAGNOSIS — N2581 Secondary hyperparathyroidism of renal origin: Secondary | ICD-10-CM | POA: Diagnosis not present

## 2016-03-04 DIAGNOSIS — N186 End stage renal disease: Secondary | ICD-10-CM | POA: Diagnosis not present

## 2016-03-06 DIAGNOSIS — N186 End stage renal disease: Secondary | ICD-10-CM | POA: Diagnosis not present

## 2016-03-06 DIAGNOSIS — N2581 Secondary hyperparathyroidism of renal origin: Secondary | ICD-10-CM | POA: Diagnosis not present

## 2016-03-08 DIAGNOSIS — N186 End stage renal disease: Secondary | ICD-10-CM | POA: Diagnosis not present

## 2016-03-08 DIAGNOSIS — N2581 Secondary hyperparathyroidism of renal origin: Secondary | ICD-10-CM | POA: Diagnosis not present

## 2016-03-11 DIAGNOSIS — N2581 Secondary hyperparathyroidism of renal origin: Secondary | ICD-10-CM | POA: Diagnosis not present

## 2016-03-11 DIAGNOSIS — N186 End stage renal disease: Secondary | ICD-10-CM | POA: Diagnosis not present

## 2016-03-13 DIAGNOSIS — Z992 Dependence on renal dialysis: Secondary | ICD-10-CM | POA: Diagnosis not present

## 2016-03-13 DIAGNOSIS — N186 End stage renal disease: Secondary | ICD-10-CM | POA: Diagnosis not present

## 2016-03-13 DIAGNOSIS — N2581 Secondary hyperparathyroidism of renal origin: Secondary | ICD-10-CM | POA: Diagnosis not present

## 2016-03-13 DIAGNOSIS — E1022 Type 1 diabetes mellitus with diabetic chronic kidney disease: Secondary | ICD-10-CM | POA: Diagnosis not present

## 2016-03-15 DIAGNOSIS — N186 End stage renal disease: Secondary | ICD-10-CM | POA: Diagnosis not present

## 2016-03-15 DIAGNOSIS — N2581 Secondary hyperparathyroidism of renal origin: Secondary | ICD-10-CM | POA: Diagnosis not present

## 2016-03-18 DIAGNOSIS — N186 End stage renal disease: Secondary | ICD-10-CM | POA: Diagnosis not present

## 2016-03-18 DIAGNOSIS — N2581 Secondary hyperparathyroidism of renal origin: Secondary | ICD-10-CM | POA: Diagnosis not present

## 2016-03-20 DIAGNOSIS — N186 End stage renal disease: Secondary | ICD-10-CM | POA: Diagnosis not present

## 2016-03-20 DIAGNOSIS — N2581 Secondary hyperparathyroidism of renal origin: Secondary | ICD-10-CM | POA: Diagnosis not present

## 2016-03-22 DIAGNOSIS — N2581 Secondary hyperparathyroidism of renal origin: Secondary | ICD-10-CM | POA: Diagnosis not present

## 2016-03-22 DIAGNOSIS — N186 End stage renal disease: Secondary | ICD-10-CM | POA: Diagnosis not present

## 2016-03-25 DIAGNOSIS — N2581 Secondary hyperparathyroidism of renal origin: Secondary | ICD-10-CM | POA: Diagnosis not present

## 2016-03-25 DIAGNOSIS — N186 End stage renal disease: Secondary | ICD-10-CM | POA: Diagnosis not present

## 2016-03-26 DIAGNOSIS — Z7682 Awaiting organ transplant status: Secondary | ICD-10-CM | POA: Diagnosis not present

## 2016-03-27 DIAGNOSIS — N186 End stage renal disease: Secondary | ICD-10-CM | POA: Diagnosis not present

## 2016-03-27 DIAGNOSIS — N2581 Secondary hyperparathyroidism of renal origin: Secondary | ICD-10-CM | POA: Diagnosis not present

## 2016-03-29 DIAGNOSIS — N2581 Secondary hyperparathyroidism of renal origin: Secondary | ICD-10-CM | POA: Diagnosis not present

## 2016-03-29 DIAGNOSIS — N186 End stage renal disease: Secondary | ICD-10-CM | POA: Diagnosis not present

## 2016-04-01 DIAGNOSIS — N186 End stage renal disease: Secondary | ICD-10-CM | POA: Diagnosis not present

## 2016-04-01 DIAGNOSIS — Z7682 Awaiting organ transplant status: Secondary | ICD-10-CM | POA: Diagnosis not present

## 2016-04-01 DIAGNOSIS — N2581 Secondary hyperparathyroidism of renal origin: Secondary | ICD-10-CM | POA: Diagnosis not present

## 2016-04-03 DIAGNOSIS — N2581 Secondary hyperparathyroidism of renal origin: Secondary | ICD-10-CM | POA: Diagnosis not present

## 2016-04-03 DIAGNOSIS — N186 End stage renal disease: Secondary | ICD-10-CM | POA: Diagnosis not present

## 2016-04-05 DIAGNOSIS — N186 End stage renal disease: Secondary | ICD-10-CM | POA: Diagnosis not present

## 2016-04-05 DIAGNOSIS — N2581 Secondary hyperparathyroidism of renal origin: Secondary | ICD-10-CM | POA: Diagnosis not present

## 2016-04-08 DIAGNOSIS — N2581 Secondary hyperparathyroidism of renal origin: Secondary | ICD-10-CM | POA: Diagnosis not present

## 2016-04-08 DIAGNOSIS — N186 End stage renal disease: Secondary | ICD-10-CM | POA: Diagnosis not present

## 2016-04-10 DIAGNOSIS — Z992 Dependence on renal dialysis: Secondary | ICD-10-CM | POA: Diagnosis not present

## 2016-04-10 DIAGNOSIS — N186 End stage renal disease: Secondary | ICD-10-CM | POA: Diagnosis not present

## 2016-04-10 DIAGNOSIS — N2581 Secondary hyperparathyroidism of renal origin: Secondary | ICD-10-CM | POA: Diagnosis not present

## 2016-04-10 DIAGNOSIS — E1022 Type 1 diabetes mellitus with diabetic chronic kidney disease: Secondary | ICD-10-CM | POA: Diagnosis not present

## 2016-04-12 DIAGNOSIS — N186 End stage renal disease: Secondary | ICD-10-CM | POA: Diagnosis not present

## 2016-04-12 DIAGNOSIS — N2581 Secondary hyperparathyroidism of renal origin: Secondary | ICD-10-CM | POA: Diagnosis not present

## 2016-04-15 DIAGNOSIS — N2581 Secondary hyperparathyroidism of renal origin: Secondary | ICD-10-CM | POA: Diagnosis not present

## 2016-04-15 DIAGNOSIS — N186 End stage renal disease: Secondary | ICD-10-CM | POA: Diagnosis not present

## 2016-04-17 DIAGNOSIS — N2581 Secondary hyperparathyroidism of renal origin: Secondary | ICD-10-CM | POA: Diagnosis not present

## 2016-04-17 DIAGNOSIS — N186 End stage renal disease: Secondary | ICD-10-CM | POA: Diagnosis not present

## 2016-04-19 DIAGNOSIS — N186 End stage renal disease: Secondary | ICD-10-CM | POA: Diagnosis not present

## 2016-04-19 DIAGNOSIS — N2581 Secondary hyperparathyroidism of renal origin: Secondary | ICD-10-CM | POA: Diagnosis not present

## 2016-04-22 DIAGNOSIS — N186 End stage renal disease: Secondary | ICD-10-CM | POA: Diagnosis not present

## 2016-04-22 DIAGNOSIS — N2581 Secondary hyperparathyroidism of renal origin: Secondary | ICD-10-CM | POA: Diagnosis not present

## 2016-04-24 DIAGNOSIS — N186 End stage renal disease: Secondary | ICD-10-CM | POA: Diagnosis not present

## 2016-04-24 DIAGNOSIS — N2581 Secondary hyperparathyroidism of renal origin: Secondary | ICD-10-CM | POA: Diagnosis not present

## 2016-04-26 DIAGNOSIS — N186 End stage renal disease: Secondary | ICD-10-CM | POA: Diagnosis not present

## 2016-04-26 DIAGNOSIS — N2581 Secondary hyperparathyroidism of renal origin: Secondary | ICD-10-CM | POA: Diagnosis not present

## 2016-04-29 DIAGNOSIS — N2581 Secondary hyperparathyroidism of renal origin: Secondary | ICD-10-CM | POA: Diagnosis not present

## 2016-04-29 DIAGNOSIS — N186 End stage renal disease: Secondary | ICD-10-CM | POA: Diagnosis not present

## 2016-05-01 DIAGNOSIS — N2581 Secondary hyperparathyroidism of renal origin: Secondary | ICD-10-CM | POA: Diagnosis not present

## 2016-05-01 DIAGNOSIS — N186 End stage renal disease: Secondary | ICD-10-CM | POA: Diagnosis not present

## 2016-05-03 DIAGNOSIS — N186 End stage renal disease: Secondary | ICD-10-CM | POA: Diagnosis not present

## 2016-05-03 DIAGNOSIS — N2581 Secondary hyperparathyroidism of renal origin: Secondary | ICD-10-CM | POA: Diagnosis not present

## 2016-05-06 DIAGNOSIS — N186 End stage renal disease: Secondary | ICD-10-CM | POA: Diagnosis not present

## 2016-05-06 DIAGNOSIS — N2581 Secondary hyperparathyroidism of renal origin: Secondary | ICD-10-CM | POA: Diagnosis not present

## 2016-05-08 DIAGNOSIS — N186 End stage renal disease: Secondary | ICD-10-CM | POA: Diagnosis not present

## 2016-05-08 DIAGNOSIS — N2581 Secondary hyperparathyroidism of renal origin: Secondary | ICD-10-CM | POA: Diagnosis not present

## 2016-05-10 DIAGNOSIS — N2581 Secondary hyperparathyroidism of renal origin: Secondary | ICD-10-CM | POA: Diagnosis not present

## 2016-05-10 DIAGNOSIS — N186 End stage renal disease: Secondary | ICD-10-CM | POA: Diagnosis not present

## 2016-05-11 DIAGNOSIS — Z992 Dependence on renal dialysis: Secondary | ICD-10-CM | POA: Diagnosis not present

## 2016-05-11 DIAGNOSIS — N186 End stage renal disease: Secondary | ICD-10-CM | POA: Diagnosis not present

## 2016-05-11 DIAGNOSIS — E1022 Type 1 diabetes mellitus with diabetic chronic kidney disease: Secondary | ICD-10-CM | POA: Diagnosis not present

## 2016-05-13 DIAGNOSIS — N186 End stage renal disease: Secondary | ICD-10-CM | POA: Diagnosis not present

## 2016-05-13 DIAGNOSIS — N2581 Secondary hyperparathyroidism of renal origin: Secondary | ICD-10-CM | POA: Diagnosis not present

## 2016-05-15 DIAGNOSIS — N2581 Secondary hyperparathyroidism of renal origin: Secondary | ICD-10-CM | POA: Diagnosis not present

## 2016-05-15 DIAGNOSIS — N186 End stage renal disease: Secondary | ICD-10-CM | POA: Diagnosis not present

## 2016-05-17 DIAGNOSIS — N186 End stage renal disease: Secondary | ICD-10-CM | POA: Diagnosis not present

## 2016-05-17 DIAGNOSIS — N2581 Secondary hyperparathyroidism of renal origin: Secondary | ICD-10-CM | POA: Diagnosis not present

## 2016-05-20 DIAGNOSIS — N2581 Secondary hyperparathyroidism of renal origin: Secondary | ICD-10-CM | POA: Diagnosis not present

## 2016-05-20 DIAGNOSIS — N186 End stage renal disease: Secondary | ICD-10-CM | POA: Diagnosis not present

## 2016-05-22 DIAGNOSIS — N2581 Secondary hyperparathyroidism of renal origin: Secondary | ICD-10-CM | POA: Diagnosis not present

## 2016-05-22 DIAGNOSIS — N186 End stage renal disease: Secondary | ICD-10-CM | POA: Diagnosis not present

## 2016-05-24 DIAGNOSIS — N2581 Secondary hyperparathyroidism of renal origin: Secondary | ICD-10-CM | POA: Diagnosis not present

## 2016-05-24 DIAGNOSIS — N186 End stage renal disease: Secondary | ICD-10-CM | POA: Diagnosis not present

## 2016-05-27 DIAGNOSIS — N186 End stage renal disease: Secondary | ICD-10-CM | POA: Diagnosis not present

## 2016-05-27 DIAGNOSIS — N2581 Secondary hyperparathyroidism of renal origin: Secondary | ICD-10-CM | POA: Diagnosis not present

## 2016-05-29 DIAGNOSIS — N186 End stage renal disease: Secondary | ICD-10-CM | POA: Diagnosis not present

## 2016-05-29 DIAGNOSIS — N2581 Secondary hyperparathyroidism of renal origin: Secondary | ICD-10-CM | POA: Diagnosis not present

## 2016-05-30 ENCOUNTER — Encounter: Payer: Self-pay | Admitting: Family Medicine

## 2016-05-30 DIAGNOSIS — H25013 Cortical age-related cataract, bilateral: Secondary | ICD-10-CM | POA: Diagnosis not present

## 2016-05-30 DIAGNOSIS — E11319 Type 2 diabetes mellitus with unspecified diabetic retinopathy without macular edema: Secondary | ICD-10-CM | POA: Diagnosis not present

## 2016-05-30 LAB — HM DIABETES EYE EXAM

## 2016-05-31 DIAGNOSIS — N2581 Secondary hyperparathyroidism of renal origin: Secondary | ICD-10-CM | POA: Diagnosis not present

## 2016-05-31 DIAGNOSIS — N186 End stage renal disease: Secondary | ICD-10-CM | POA: Diagnosis not present

## 2016-06-03 DIAGNOSIS — N2581 Secondary hyperparathyroidism of renal origin: Secondary | ICD-10-CM | POA: Diagnosis not present

## 2016-06-03 DIAGNOSIS — N186 End stage renal disease: Secondary | ICD-10-CM | POA: Diagnosis not present

## 2016-06-05 DIAGNOSIS — N186 End stage renal disease: Secondary | ICD-10-CM | POA: Diagnosis not present

## 2016-06-05 DIAGNOSIS — N2581 Secondary hyperparathyroidism of renal origin: Secondary | ICD-10-CM | POA: Diagnosis not present

## 2016-06-07 DIAGNOSIS — N2581 Secondary hyperparathyroidism of renal origin: Secondary | ICD-10-CM | POA: Diagnosis not present

## 2016-06-07 DIAGNOSIS — N186 End stage renal disease: Secondary | ICD-10-CM | POA: Diagnosis not present

## 2016-06-10 DIAGNOSIS — Z992 Dependence on renal dialysis: Secondary | ICD-10-CM | POA: Diagnosis not present

## 2016-06-10 DIAGNOSIS — N2581 Secondary hyperparathyroidism of renal origin: Secondary | ICD-10-CM | POA: Diagnosis not present

## 2016-06-10 DIAGNOSIS — M25562 Pain in left knee: Secondary | ICD-10-CM | POA: Diagnosis not present

## 2016-06-10 DIAGNOSIS — E1022 Type 1 diabetes mellitus with diabetic chronic kidney disease: Secondary | ICD-10-CM | POA: Diagnosis not present

## 2016-06-10 DIAGNOSIS — N186 End stage renal disease: Secondary | ICD-10-CM | POA: Diagnosis not present

## 2016-06-12 DIAGNOSIS — N2581 Secondary hyperparathyroidism of renal origin: Secondary | ICD-10-CM | POA: Diagnosis not present

## 2016-06-12 DIAGNOSIS — N186 End stage renal disease: Secondary | ICD-10-CM | POA: Diagnosis not present

## 2016-06-13 ENCOUNTER — Other Ambulatory Visit: Payer: Self-pay | Admitting: Vascular Surgery

## 2016-06-13 DIAGNOSIS — I70243 Atherosclerosis of native arteries of left leg with ulceration of ankle: Secondary | ICD-10-CM

## 2016-06-14 DIAGNOSIS — N2581 Secondary hyperparathyroidism of renal origin: Secondary | ICD-10-CM | POA: Diagnosis not present

## 2016-06-14 DIAGNOSIS — N186 End stage renal disease: Secondary | ICD-10-CM | POA: Diagnosis not present

## 2016-06-17 DIAGNOSIS — N2581 Secondary hyperparathyroidism of renal origin: Secondary | ICD-10-CM | POA: Diagnosis not present

## 2016-06-17 DIAGNOSIS — N186 End stage renal disease: Secondary | ICD-10-CM | POA: Diagnosis not present

## 2016-06-19 DIAGNOSIS — N2581 Secondary hyperparathyroidism of renal origin: Secondary | ICD-10-CM | POA: Diagnosis not present

## 2016-06-19 DIAGNOSIS — N186 End stage renal disease: Secondary | ICD-10-CM | POA: Diagnosis not present

## 2016-06-20 DIAGNOSIS — H2512 Age-related nuclear cataract, left eye: Secondary | ICD-10-CM | POA: Diagnosis not present

## 2016-06-20 DIAGNOSIS — H25013 Cortical age-related cataract, bilateral: Secondary | ICD-10-CM | POA: Diagnosis not present

## 2016-06-20 DIAGNOSIS — H25043 Posterior subcapsular polar age-related cataract, bilateral: Secondary | ICD-10-CM | POA: Diagnosis not present

## 2016-06-20 DIAGNOSIS — H2513 Age-related nuclear cataract, bilateral: Secondary | ICD-10-CM | POA: Diagnosis not present

## 2016-06-20 DIAGNOSIS — H401134 Primary open-angle glaucoma, bilateral, indeterminate stage: Secondary | ICD-10-CM | POA: Diagnosis not present

## 2016-06-21 DIAGNOSIS — N186 End stage renal disease: Secondary | ICD-10-CM | POA: Diagnosis not present

## 2016-06-21 DIAGNOSIS — N2581 Secondary hyperparathyroidism of renal origin: Secondary | ICD-10-CM | POA: Diagnosis not present

## 2016-06-24 DIAGNOSIS — Z7682 Awaiting organ transplant status: Secondary | ICD-10-CM | POA: Diagnosis not present

## 2016-06-24 DIAGNOSIS — N186 End stage renal disease: Secondary | ICD-10-CM | POA: Diagnosis not present

## 2016-06-24 DIAGNOSIS — N2581 Secondary hyperparathyroidism of renal origin: Secondary | ICD-10-CM | POA: Diagnosis not present

## 2016-06-26 DIAGNOSIS — N2581 Secondary hyperparathyroidism of renal origin: Secondary | ICD-10-CM | POA: Diagnosis not present

## 2016-06-26 DIAGNOSIS — S83242A Other tear of medial meniscus, current injury, left knee, initial encounter: Secondary | ICD-10-CM | POA: Diagnosis not present

## 2016-06-26 DIAGNOSIS — N186 End stage renal disease: Secondary | ICD-10-CM | POA: Diagnosis not present

## 2016-06-28 ENCOUNTER — Encounter: Payer: Self-pay | Admitting: Vascular Surgery

## 2016-06-28 DIAGNOSIS — N2581 Secondary hyperparathyroidism of renal origin: Secondary | ICD-10-CM | POA: Diagnosis not present

## 2016-06-28 DIAGNOSIS — N186 End stage renal disease: Secondary | ICD-10-CM | POA: Diagnosis not present

## 2016-07-01 DIAGNOSIS — N2581 Secondary hyperparathyroidism of renal origin: Secondary | ICD-10-CM | POA: Diagnosis not present

## 2016-07-01 DIAGNOSIS — N186 End stage renal disease: Secondary | ICD-10-CM | POA: Diagnosis not present

## 2016-07-01 DIAGNOSIS — R197 Diarrhea, unspecified: Secondary | ICD-10-CM | POA: Diagnosis not present

## 2016-07-02 DIAGNOSIS — M25262 Flail joint, left knee: Secondary | ICD-10-CM | POA: Diagnosis not present

## 2016-07-03 DIAGNOSIS — N186 End stage renal disease: Secondary | ICD-10-CM | POA: Diagnosis not present

## 2016-07-03 DIAGNOSIS — R197 Diarrhea, unspecified: Secondary | ICD-10-CM | POA: Diagnosis not present

## 2016-07-03 DIAGNOSIS — N2581 Secondary hyperparathyroidism of renal origin: Secondary | ICD-10-CM | POA: Diagnosis not present

## 2016-07-05 DIAGNOSIS — R197 Diarrhea, unspecified: Secondary | ICD-10-CM | POA: Diagnosis not present

## 2016-07-05 DIAGNOSIS — N2581 Secondary hyperparathyroidism of renal origin: Secondary | ICD-10-CM | POA: Diagnosis not present

## 2016-07-05 DIAGNOSIS — N186 End stage renal disease: Secondary | ICD-10-CM | POA: Diagnosis not present

## 2016-07-08 DIAGNOSIS — N2581 Secondary hyperparathyroidism of renal origin: Secondary | ICD-10-CM | POA: Diagnosis not present

## 2016-07-08 DIAGNOSIS — N186 End stage renal disease: Secondary | ICD-10-CM | POA: Diagnosis not present

## 2016-07-09 ENCOUNTER — Encounter: Payer: Medicare Other | Admitting: Vascular Surgery

## 2016-07-09 ENCOUNTER — Encounter (HOSPITAL_COMMUNITY): Payer: Medicare Other

## 2016-07-10 DIAGNOSIS — N2581 Secondary hyperparathyroidism of renal origin: Secondary | ICD-10-CM | POA: Diagnosis not present

## 2016-07-10 DIAGNOSIS — N186 End stage renal disease: Secondary | ICD-10-CM | POA: Diagnosis not present

## 2016-07-11 DIAGNOSIS — E1022 Type 1 diabetes mellitus with diabetic chronic kidney disease: Secondary | ICD-10-CM | POA: Diagnosis not present

## 2016-07-11 DIAGNOSIS — T82858A Stenosis of vascular prosthetic devices, implants and grafts, initial encounter: Secondary | ICD-10-CM | POA: Diagnosis not present

## 2016-07-11 DIAGNOSIS — N186 End stage renal disease: Secondary | ICD-10-CM | POA: Diagnosis not present

## 2016-07-11 DIAGNOSIS — Z992 Dependence on renal dialysis: Secondary | ICD-10-CM | POA: Diagnosis not present

## 2016-07-11 DIAGNOSIS — I871 Compression of vein: Secondary | ICD-10-CM | POA: Diagnosis not present

## 2016-07-12 DIAGNOSIS — N2581 Secondary hyperparathyroidism of renal origin: Secondary | ICD-10-CM | POA: Diagnosis not present

## 2016-07-12 DIAGNOSIS — N186 End stage renal disease: Secondary | ICD-10-CM | POA: Diagnosis not present

## 2016-07-15 DIAGNOSIS — N2581 Secondary hyperparathyroidism of renal origin: Secondary | ICD-10-CM | POA: Diagnosis not present

## 2016-07-15 DIAGNOSIS — N186 End stage renal disease: Secondary | ICD-10-CM | POA: Diagnosis not present

## 2016-07-16 DIAGNOSIS — H2512 Age-related nuclear cataract, left eye: Secondary | ICD-10-CM | POA: Diagnosis not present

## 2016-07-16 DIAGNOSIS — H25812 Combined forms of age-related cataract, left eye: Secondary | ICD-10-CM | POA: Diagnosis not present

## 2016-07-17 DIAGNOSIS — N186 End stage renal disease: Secondary | ICD-10-CM | POA: Diagnosis not present

## 2016-07-17 DIAGNOSIS — N2581 Secondary hyperparathyroidism of renal origin: Secondary | ICD-10-CM | POA: Diagnosis not present

## 2016-07-19 DIAGNOSIS — N2581 Secondary hyperparathyroidism of renal origin: Secondary | ICD-10-CM | POA: Diagnosis not present

## 2016-07-19 DIAGNOSIS — N186 End stage renal disease: Secondary | ICD-10-CM | POA: Diagnosis not present

## 2016-07-22 DIAGNOSIS — N186 End stage renal disease: Secondary | ICD-10-CM | POA: Diagnosis not present

## 2016-07-22 DIAGNOSIS — N2581 Secondary hyperparathyroidism of renal origin: Secondary | ICD-10-CM | POA: Diagnosis not present

## 2016-07-23 DIAGNOSIS — E119 Type 2 diabetes mellitus without complications: Secondary | ICD-10-CM | POA: Diagnosis not present

## 2016-07-23 DIAGNOSIS — Z7682 Awaiting organ transplant status: Secondary | ICD-10-CM | POA: Diagnosis not present

## 2016-07-24 DIAGNOSIS — S83242A Other tear of medial meniscus, current injury, left knee, initial encounter: Secondary | ICD-10-CM | POA: Diagnosis not present

## 2016-07-24 DIAGNOSIS — S83512A Sprain of anterior cruciate ligament of left knee, initial encounter: Secondary | ICD-10-CM | POA: Diagnosis not present

## 2016-07-24 DIAGNOSIS — N2581 Secondary hyperparathyroidism of renal origin: Secondary | ICD-10-CM | POA: Diagnosis not present

## 2016-07-24 DIAGNOSIS — N186 End stage renal disease: Secondary | ICD-10-CM | POA: Diagnosis not present

## 2016-07-24 DIAGNOSIS — S83282A Other tear of lateral meniscus, current injury, left knee, initial encounter: Secondary | ICD-10-CM | POA: Diagnosis not present

## 2016-07-26 DIAGNOSIS — N2581 Secondary hyperparathyroidism of renal origin: Secondary | ICD-10-CM | POA: Diagnosis not present

## 2016-07-26 DIAGNOSIS — N186 End stage renal disease: Secondary | ICD-10-CM | POA: Diagnosis not present

## 2016-07-29 DIAGNOSIS — N186 End stage renal disease: Secondary | ICD-10-CM | POA: Diagnosis not present

## 2016-07-29 DIAGNOSIS — N2581 Secondary hyperparathyroidism of renal origin: Secondary | ICD-10-CM | POA: Diagnosis not present

## 2016-07-31 DIAGNOSIS — N2581 Secondary hyperparathyroidism of renal origin: Secondary | ICD-10-CM | POA: Diagnosis not present

## 2016-07-31 DIAGNOSIS — N186 End stage renal disease: Secondary | ICD-10-CM | POA: Diagnosis not present

## 2016-08-01 DIAGNOSIS — H2511 Age-related nuclear cataract, right eye: Secondary | ICD-10-CM | POA: Diagnosis not present

## 2016-08-01 DIAGNOSIS — H25011 Cortical age-related cataract, right eye: Secondary | ICD-10-CM | POA: Diagnosis not present

## 2016-08-02 DIAGNOSIS — N2581 Secondary hyperparathyroidism of renal origin: Secondary | ICD-10-CM | POA: Diagnosis not present

## 2016-08-02 DIAGNOSIS — N186 End stage renal disease: Secondary | ICD-10-CM | POA: Diagnosis not present

## 2016-08-05 DIAGNOSIS — N186 End stage renal disease: Secondary | ICD-10-CM | POA: Diagnosis not present

## 2016-08-05 DIAGNOSIS — N2581 Secondary hyperparathyroidism of renal origin: Secondary | ICD-10-CM | POA: Diagnosis not present

## 2016-08-06 DIAGNOSIS — M25562 Pain in left knee: Secondary | ICD-10-CM | POA: Diagnosis not present

## 2016-08-06 DIAGNOSIS — M25662 Stiffness of left knee, not elsewhere classified: Secondary | ICD-10-CM | POA: Diagnosis not present

## 2016-08-06 DIAGNOSIS — R262 Difficulty in walking, not elsewhere classified: Secondary | ICD-10-CM | POA: Diagnosis not present

## 2016-08-06 DIAGNOSIS — M6281 Muscle weakness (generalized): Secondary | ICD-10-CM | POA: Diagnosis not present

## 2016-08-07 DIAGNOSIS — N186 End stage renal disease: Secondary | ICD-10-CM | POA: Diagnosis not present

## 2016-08-07 DIAGNOSIS — N2581 Secondary hyperparathyroidism of renal origin: Secondary | ICD-10-CM | POA: Diagnosis not present

## 2016-08-09 DIAGNOSIS — N186 End stage renal disease: Secondary | ICD-10-CM | POA: Diagnosis not present

## 2016-08-09 DIAGNOSIS — N2581 Secondary hyperparathyroidism of renal origin: Secondary | ICD-10-CM | POA: Diagnosis not present

## 2016-08-10 DIAGNOSIS — Z992 Dependence on renal dialysis: Secondary | ICD-10-CM | POA: Diagnosis not present

## 2016-08-10 DIAGNOSIS — N186 End stage renal disease: Secondary | ICD-10-CM | POA: Diagnosis not present

## 2016-08-10 DIAGNOSIS — E1022 Type 1 diabetes mellitus with diabetic chronic kidney disease: Secondary | ICD-10-CM | POA: Diagnosis not present

## 2016-08-12 DIAGNOSIS — N2581 Secondary hyperparathyroidism of renal origin: Secondary | ICD-10-CM | POA: Diagnosis not present

## 2016-08-12 DIAGNOSIS — N186 End stage renal disease: Secondary | ICD-10-CM | POA: Diagnosis not present

## 2016-08-13 DIAGNOSIS — H25811 Combined forms of age-related cataract, right eye: Secondary | ICD-10-CM | POA: Diagnosis not present

## 2016-08-13 DIAGNOSIS — R262 Difficulty in walking, not elsewhere classified: Secondary | ICD-10-CM | POA: Diagnosis not present

## 2016-08-13 DIAGNOSIS — M6281 Muscle weakness (generalized): Secondary | ICD-10-CM | POA: Diagnosis not present

## 2016-08-13 DIAGNOSIS — M25562 Pain in left knee: Secondary | ICD-10-CM | POA: Diagnosis not present

## 2016-08-13 DIAGNOSIS — M25662 Stiffness of left knee, not elsewhere classified: Secondary | ICD-10-CM | POA: Diagnosis not present

## 2016-08-13 DIAGNOSIS — H2511 Age-related nuclear cataract, right eye: Secondary | ICD-10-CM | POA: Diagnosis not present

## 2016-08-14 DIAGNOSIS — N2581 Secondary hyperparathyroidism of renal origin: Secondary | ICD-10-CM | POA: Diagnosis not present

## 2016-08-14 DIAGNOSIS — N186 End stage renal disease: Secondary | ICD-10-CM | POA: Diagnosis not present

## 2016-08-16 DIAGNOSIS — N186 End stage renal disease: Secondary | ICD-10-CM | POA: Diagnosis not present

## 2016-08-16 DIAGNOSIS — N2581 Secondary hyperparathyroidism of renal origin: Secondary | ICD-10-CM | POA: Diagnosis not present

## 2016-08-19 DIAGNOSIS — N186 End stage renal disease: Secondary | ICD-10-CM | POA: Diagnosis not present

## 2016-08-19 DIAGNOSIS — N2581 Secondary hyperparathyroidism of renal origin: Secondary | ICD-10-CM | POA: Diagnosis not present

## 2016-08-20 ENCOUNTER — Encounter (INDEPENDENT_AMBULATORY_CARE_PROVIDER_SITE_OTHER): Payer: BLUE CROSS/BLUE SHIELD | Admitting: Ophthalmology

## 2016-08-20 DIAGNOSIS — H43813 Vitreous degeneration, bilateral: Secondary | ICD-10-CM

## 2016-08-20 DIAGNOSIS — H35033 Hypertensive retinopathy, bilateral: Secondary | ICD-10-CM

## 2016-08-20 DIAGNOSIS — E113513 Type 2 diabetes mellitus with proliferative diabetic retinopathy with macular edema, bilateral: Secondary | ICD-10-CM

## 2016-08-20 DIAGNOSIS — I10 Essential (primary) hypertension: Secondary | ICD-10-CM | POA: Diagnosis not present

## 2016-08-20 DIAGNOSIS — E11311 Type 2 diabetes mellitus with unspecified diabetic retinopathy with macular edema: Secondary | ICD-10-CM

## 2016-08-21 ENCOUNTER — Encounter: Payer: Self-pay | Admitting: Vascular Surgery

## 2016-08-21 DIAGNOSIS — N2581 Secondary hyperparathyroidism of renal origin: Secondary | ICD-10-CM | POA: Diagnosis not present

## 2016-08-21 DIAGNOSIS — N186 End stage renal disease: Secondary | ICD-10-CM | POA: Diagnosis not present

## 2016-08-23 DIAGNOSIS — N186 End stage renal disease: Secondary | ICD-10-CM | POA: Diagnosis not present

## 2016-08-23 DIAGNOSIS — N2581 Secondary hyperparathyroidism of renal origin: Secondary | ICD-10-CM | POA: Diagnosis not present

## 2016-08-26 DIAGNOSIS — N2581 Secondary hyperparathyroidism of renal origin: Secondary | ICD-10-CM | POA: Diagnosis not present

## 2016-08-26 DIAGNOSIS — N186 End stage renal disease: Secondary | ICD-10-CM | POA: Diagnosis not present

## 2016-08-28 DIAGNOSIS — N2581 Secondary hyperparathyroidism of renal origin: Secondary | ICD-10-CM | POA: Diagnosis not present

## 2016-08-28 DIAGNOSIS — N186 End stage renal disease: Secondary | ICD-10-CM | POA: Diagnosis not present

## 2016-08-30 DIAGNOSIS — N2581 Secondary hyperparathyroidism of renal origin: Secondary | ICD-10-CM | POA: Diagnosis not present

## 2016-08-30 DIAGNOSIS — N186 End stage renal disease: Secondary | ICD-10-CM | POA: Diagnosis not present

## 2016-09-02 DIAGNOSIS — N2581 Secondary hyperparathyroidism of renal origin: Secondary | ICD-10-CM | POA: Diagnosis not present

## 2016-09-02 DIAGNOSIS — N186 End stage renal disease: Secondary | ICD-10-CM | POA: Diagnosis not present

## 2016-09-03 ENCOUNTER — Encounter (HOSPITAL_COMMUNITY): Payer: Medicare Other

## 2016-09-03 ENCOUNTER — Encounter: Payer: Medicare Other | Admitting: Vascular Surgery

## 2016-09-03 ENCOUNTER — Encounter (INDEPENDENT_AMBULATORY_CARE_PROVIDER_SITE_OTHER): Payer: BLUE CROSS/BLUE SHIELD | Admitting: Ophthalmology

## 2016-09-03 DIAGNOSIS — E11311 Type 2 diabetes mellitus with unspecified diabetic retinopathy with macular edema: Secondary | ICD-10-CM | POA: Diagnosis not present

## 2016-09-03 DIAGNOSIS — E113512 Type 2 diabetes mellitus with proliferative diabetic retinopathy with macular edema, left eye: Secondary | ICD-10-CM | POA: Diagnosis not present

## 2016-09-04 DIAGNOSIS — N186 End stage renal disease: Secondary | ICD-10-CM | POA: Diagnosis not present

## 2016-09-04 DIAGNOSIS — N2581 Secondary hyperparathyroidism of renal origin: Secondary | ICD-10-CM | POA: Diagnosis not present

## 2016-09-06 DIAGNOSIS — N2581 Secondary hyperparathyroidism of renal origin: Secondary | ICD-10-CM | POA: Diagnosis not present

## 2016-09-06 DIAGNOSIS — N186 End stage renal disease: Secondary | ICD-10-CM | POA: Diagnosis not present

## 2016-09-09 DIAGNOSIS — N186 End stage renal disease: Secondary | ICD-10-CM | POA: Diagnosis not present

## 2016-09-09 DIAGNOSIS — N2581 Secondary hyperparathyroidism of renal origin: Secondary | ICD-10-CM | POA: Diagnosis not present

## 2016-09-10 DIAGNOSIS — Z992 Dependence on renal dialysis: Secondary | ICD-10-CM | POA: Diagnosis not present

## 2016-09-10 DIAGNOSIS — E1022 Type 1 diabetes mellitus with diabetic chronic kidney disease: Secondary | ICD-10-CM | POA: Diagnosis not present

## 2016-09-10 DIAGNOSIS — N186 End stage renal disease: Secondary | ICD-10-CM | POA: Diagnosis not present

## 2016-09-11 DIAGNOSIS — N186 End stage renal disease: Secondary | ICD-10-CM | POA: Diagnosis not present

## 2016-09-11 DIAGNOSIS — N2581 Secondary hyperparathyroidism of renal origin: Secondary | ICD-10-CM | POA: Diagnosis not present

## 2016-09-13 DIAGNOSIS — N2581 Secondary hyperparathyroidism of renal origin: Secondary | ICD-10-CM | POA: Diagnosis not present

## 2016-09-13 DIAGNOSIS — N186 End stage renal disease: Secondary | ICD-10-CM | POA: Diagnosis not present

## 2016-09-16 DIAGNOSIS — N2581 Secondary hyperparathyroidism of renal origin: Secondary | ICD-10-CM | POA: Diagnosis not present

## 2016-09-16 DIAGNOSIS — N186 End stage renal disease: Secondary | ICD-10-CM | POA: Diagnosis not present

## 2016-09-17 ENCOUNTER — Encounter (INDEPENDENT_AMBULATORY_CARE_PROVIDER_SITE_OTHER): Payer: BLUE CROSS/BLUE SHIELD | Admitting: Ophthalmology

## 2016-09-17 DIAGNOSIS — I10 Essential (primary) hypertension: Secondary | ICD-10-CM

## 2016-09-17 DIAGNOSIS — H35033 Hypertensive retinopathy, bilateral: Secondary | ICD-10-CM

## 2016-09-17 DIAGNOSIS — H43813 Vitreous degeneration, bilateral: Secondary | ICD-10-CM | POA: Diagnosis not present

## 2016-09-17 DIAGNOSIS — E103513 Type 1 diabetes mellitus with proliferative diabetic retinopathy with macular edema, bilateral: Secondary | ICD-10-CM | POA: Diagnosis not present

## 2016-09-17 DIAGNOSIS — E10311 Type 1 diabetes mellitus with unspecified diabetic retinopathy with macular edema: Secondary | ICD-10-CM

## 2016-09-18 DIAGNOSIS — N186 End stage renal disease: Secondary | ICD-10-CM | POA: Diagnosis not present

## 2016-09-18 DIAGNOSIS — N2581 Secondary hyperparathyroidism of renal origin: Secondary | ICD-10-CM | POA: Diagnosis not present

## 2016-09-20 DIAGNOSIS — N2581 Secondary hyperparathyroidism of renal origin: Secondary | ICD-10-CM | POA: Diagnosis not present

## 2016-09-20 DIAGNOSIS — N186 End stage renal disease: Secondary | ICD-10-CM | POA: Diagnosis not present

## 2016-09-23 DIAGNOSIS — N186 End stage renal disease: Secondary | ICD-10-CM | POA: Diagnosis not present

## 2016-09-23 DIAGNOSIS — N2581 Secondary hyperparathyroidism of renal origin: Secondary | ICD-10-CM | POA: Diagnosis not present

## 2016-09-25 DIAGNOSIS — N2581 Secondary hyperparathyroidism of renal origin: Secondary | ICD-10-CM | POA: Diagnosis not present

## 2016-09-25 DIAGNOSIS — N186 End stage renal disease: Secondary | ICD-10-CM | POA: Diagnosis not present

## 2016-09-26 DIAGNOSIS — M25662 Stiffness of left knee, not elsewhere classified: Secondary | ICD-10-CM | POA: Diagnosis not present

## 2016-09-26 DIAGNOSIS — M6281 Muscle weakness (generalized): Secondary | ICD-10-CM | POA: Diagnosis not present

## 2016-09-26 DIAGNOSIS — M25562 Pain in left knee: Secondary | ICD-10-CM | POA: Diagnosis not present

## 2016-09-26 DIAGNOSIS — R262 Difficulty in walking, not elsewhere classified: Secondary | ICD-10-CM | POA: Diagnosis not present

## 2016-09-27 DIAGNOSIS — N186 End stage renal disease: Secondary | ICD-10-CM | POA: Diagnosis not present

## 2016-09-27 DIAGNOSIS — N2581 Secondary hyperparathyroidism of renal origin: Secondary | ICD-10-CM | POA: Diagnosis not present

## 2016-09-30 DIAGNOSIS — N186 End stage renal disease: Secondary | ICD-10-CM | POA: Diagnosis not present

## 2016-09-30 DIAGNOSIS — N2581 Secondary hyperparathyroidism of renal origin: Secondary | ICD-10-CM | POA: Diagnosis not present

## 2016-10-02 DIAGNOSIS — N2581 Secondary hyperparathyroidism of renal origin: Secondary | ICD-10-CM | POA: Diagnosis not present

## 2016-10-02 DIAGNOSIS — Z7682 Awaiting organ transplant status: Secondary | ICD-10-CM | POA: Diagnosis not present

## 2016-10-02 DIAGNOSIS — N186 End stage renal disease: Secondary | ICD-10-CM | POA: Diagnosis not present

## 2016-10-03 DIAGNOSIS — Z01810 Encounter for preprocedural cardiovascular examination: Secondary | ICD-10-CM | POA: Diagnosis not present

## 2016-10-03 DIAGNOSIS — N186 End stage renal disease: Secondary | ICD-10-CM | POA: Diagnosis not present

## 2016-10-04 DIAGNOSIS — N186 End stage renal disease: Secondary | ICD-10-CM | POA: Diagnosis not present

## 2016-10-04 DIAGNOSIS — N2581 Secondary hyperparathyroidism of renal origin: Secondary | ICD-10-CM | POA: Diagnosis not present

## 2016-10-07 DIAGNOSIS — N186 End stage renal disease: Secondary | ICD-10-CM | POA: Diagnosis not present

## 2016-10-07 DIAGNOSIS — N2581 Secondary hyperparathyroidism of renal origin: Secondary | ICD-10-CM | POA: Diagnosis not present

## 2016-10-09 DIAGNOSIS — N186 End stage renal disease: Secondary | ICD-10-CM | POA: Diagnosis not present

## 2016-10-09 DIAGNOSIS — N2581 Secondary hyperparathyroidism of renal origin: Secondary | ICD-10-CM | POA: Diagnosis not present

## 2016-10-10 ENCOUNTER — Encounter (INDEPENDENT_AMBULATORY_CARE_PROVIDER_SITE_OTHER): Payer: BLUE CROSS/BLUE SHIELD | Admitting: Ophthalmology

## 2016-10-10 DIAGNOSIS — M25662 Stiffness of left knee, not elsewhere classified: Secondary | ICD-10-CM | POA: Diagnosis not present

## 2016-10-10 DIAGNOSIS — E103511 Type 1 diabetes mellitus with proliferative diabetic retinopathy with macular edema, right eye: Secondary | ICD-10-CM | POA: Diagnosis not present

## 2016-10-10 DIAGNOSIS — M6281 Muscle weakness (generalized): Secondary | ICD-10-CM | POA: Diagnosis not present

## 2016-10-10 DIAGNOSIS — M25562 Pain in left knee: Secondary | ICD-10-CM | POA: Diagnosis not present

## 2016-10-10 DIAGNOSIS — R262 Difficulty in walking, not elsewhere classified: Secondary | ICD-10-CM | POA: Diagnosis not present

## 2016-10-10 DIAGNOSIS — E10311 Type 1 diabetes mellitus with unspecified diabetic retinopathy with macular edema: Secondary | ICD-10-CM | POA: Diagnosis not present

## 2016-10-11 DIAGNOSIS — N186 End stage renal disease: Secondary | ICD-10-CM | POA: Diagnosis not present

## 2016-10-11 DIAGNOSIS — E1022 Type 1 diabetes mellitus with diabetic chronic kidney disease: Secondary | ICD-10-CM | POA: Diagnosis not present

## 2016-10-11 DIAGNOSIS — N2581 Secondary hyperparathyroidism of renal origin: Secondary | ICD-10-CM | POA: Diagnosis not present

## 2016-10-11 DIAGNOSIS — Z992 Dependence on renal dialysis: Secondary | ICD-10-CM | POA: Diagnosis not present

## 2016-10-14 DIAGNOSIS — N186 End stage renal disease: Secondary | ICD-10-CM | POA: Diagnosis not present

## 2016-10-14 DIAGNOSIS — N2581 Secondary hyperparathyroidism of renal origin: Secondary | ICD-10-CM | POA: Diagnosis not present

## 2016-10-15 ENCOUNTER — Encounter (INDEPENDENT_AMBULATORY_CARE_PROVIDER_SITE_OTHER): Payer: BLUE CROSS/BLUE SHIELD | Admitting: Ophthalmology

## 2016-10-15 DIAGNOSIS — H35033 Hypertensive retinopathy, bilateral: Secondary | ICD-10-CM | POA: Diagnosis not present

## 2016-10-15 DIAGNOSIS — H43813 Vitreous degeneration, bilateral: Secondary | ICD-10-CM | POA: Diagnosis not present

## 2016-10-15 DIAGNOSIS — E10311 Type 1 diabetes mellitus with unspecified diabetic retinopathy with macular edema: Secondary | ICD-10-CM | POA: Diagnosis not present

## 2016-10-15 DIAGNOSIS — E103513 Type 1 diabetes mellitus with proliferative diabetic retinopathy with macular edema, bilateral: Secondary | ICD-10-CM

## 2016-10-15 DIAGNOSIS — I10 Essential (primary) hypertension: Secondary | ICD-10-CM

## 2016-10-16 DIAGNOSIS — N186 End stage renal disease: Secondary | ICD-10-CM | POA: Diagnosis not present

## 2016-10-16 DIAGNOSIS — N2581 Secondary hyperparathyroidism of renal origin: Secondary | ICD-10-CM | POA: Diagnosis not present

## 2016-10-17 DIAGNOSIS — M25562 Pain in left knee: Secondary | ICD-10-CM | POA: Diagnosis not present

## 2016-10-18 DIAGNOSIS — N186 End stage renal disease: Secondary | ICD-10-CM | POA: Diagnosis not present

## 2016-10-18 DIAGNOSIS — N2581 Secondary hyperparathyroidism of renal origin: Secondary | ICD-10-CM | POA: Diagnosis not present

## 2016-10-21 DIAGNOSIS — N186 End stage renal disease: Secondary | ICD-10-CM | POA: Diagnosis not present

## 2016-10-21 DIAGNOSIS — N2581 Secondary hyperparathyroidism of renal origin: Secondary | ICD-10-CM | POA: Diagnosis not present

## 2016-10-21 DIAGNOSIS — R197 Diarrhea, unspecified: Secondary | ICD-10-CM | POA: Diagnosis not present

## 2016-10-23 DIAGNOSIS — N186 End stage renal disease: Secondary | ICD-10-CM | POA: Diagnosis not present

## 2016-10-23 DIAGNOSIS — N2581 Secondary hyperparathyroidism of renal origin: Secondary | ICD-10-CM | POA: Diagnosis not present

## 2016-10-23 DIAGNOSIS — R197 Diarrhea, unspecified: Secondary | ICD-10-CM | POA: Diagnosis not present

## 2016-10-25 DIAGNOSIS — N2581 Secondary hyperparathyroidism of renal origin: Secondary | ICD-10-CM | POA: Diagnosis not present

## 2016-10-25 DIAGNOSIS — N186 End stage renal disease: Secondary | ICD-10-CM | POA: Diagnosis not present

## 2016-10-25 DIAGNOSIS — R197 Diarrhea, unspecified: Secondary | ICD-10-CM | POA: Diagnosis not present

## 2016-10-28 DIAGNOSIS — N186 End stage renal disease: Secondary | ICD-10-CM | POA: Diagnosis not present

## 2016-10-28 DIAGNOSIS — Z23 Encounter for immunization: Secondary | ICD-10-CM | POA: Diagnosis not present

## 2016-10-28 DIAGNOSIS — N2581 Secondary hyperparathyroidism of renal origin: Secondary | ICD-10-CM | POA: Diagnosis not present

## 2016-10-30 DIAGNOSIS — N2581 Secondary hyperparathyroidism of renal origin: Secondary | ICD-10-CM | POA: Diagnosis not present

## 2016-10-30 DIAGNOSIS — Z23 Encounter for immunization: Secondary | ICD-10-CM | POA: Diagnosis not present

## 2016-10-30 DIAGNOSIS — N186 End stage renal disease: Secondary | ICD-10-CM | POA: Diagnosis not present

## 2016-10-31 ENCOUNTER — Encounter (INDEPENDENT_AMBULATORY_CARE_PROVIDER_SITE_OTHER): Payer: BLUE CROSS/BLUE SHIELD | Admitting: Ophthalmology

## 2016-10-31 DIAGNOSIS — M25562 Pain in left knee: Secondary | ICD-10-CM | POA: Diagnosis not present

## 2016-10-31 DIAGNOSIS — M6281 Muscle weakness (generalized): Secondary | ICD-10-CM | POA: Diagnosis not present

## 2016-10-31 DIAGNOSIS — M25662 Stiffness of left knee, not elsewhere classified: Secondary | ICD-10-CM | POA: Diagnosis not present

## 2016-10-31 DIAGNOSIS — R262 Difficulty in walking, not elsewhere classified: Secondary | ICD-10-CM | POA: Diagnosis not present

## 2016-11-01 DIAGNOSIS — N2581 Secondary hyperparathyroidism of renal origin: Secondary | ICD-10-CM | POA: Diagnosis not present

## 2016-11-01 DIAGNOSIS — N186 End stage renal disease: Secondary | ICD-10-CM | POA: Diagnosis not present

## 2016-11-01 DIAGNOSIS — Z23 Encounter for immunization: Secondary | ICD-10-CM | POA: Diagnosis not present

## 2016-11-04 DIAGNOSIS — N2581 Secondary hyperparathyroidism of renal origin: Secondary | ICD-10-CM | POA: Diagnosis not present

## 2016-11-04 DIAGNOSIS — R197 Diarrhea, unspecified: Secondary | ICD-10-CM | POA: Diagnosis not present

## 2016-11-04 DIAGNOSIS — N186 End stage renal disease: Secondary | ICD-10-CM | POA: Diagnosis not present

## 2016-11-05 DIAGNOSIS — M25662 Stiffness of left knee, not elsewhere classified: Secondary | ICD-10-CM | POA: Diagnosis not present

## 2016-11-05 DIAGNOSIS — R262 Difficulty in walking, not elsewhere classified: Secondary | ICD-10-CM | POA: Diagnosis not present

## 2016-11-05 DIAGNOSIS — M6281 Muscle weakness (generalized): Secondary | ICD-10-CM | POA: Diagnosis not present

## 2016-11-05 DIAGNOSIS — M25562 Pain in left knee: Secondary | ICD-10-CM | POA: Diagnosis not present

## 2016-11-06 DIAGNOSIS — N186 End stage renal disease: Secondary | ICD-10-CM | POA: Diagnosis not present

## 2016-11-06 DIAGNOSIS — N2581 Secondary hyperparathyroidism of renal origin: Secondary | ICD-10-CM | POA: Diagnosis not present

## 2016-11-06 DIAGNOSIS — R197 Diarrhea, unspecified: Secondary | ICD-10-CM | POA: Diagnosis not present

## 2016-11-08 DIAGNOSIS — N2581 Secondary hyperparathyroidism of renal origin: Secondary | ICD-10-CM | POA: Diagnosis not present

## 2016-11-08 DIAGNOSIS — R197 Diarrhea, unspecified: Secondary | ICD-10-CM | POA: Diagnosis not present

## 2016-11-08 DIAGNOSIS — N186 End stage renal disease: Secondary | ICD-10-CM | POA: Diagnosis not present

## 2016-11-10 DIAGNOSIS — Z992 Dependence on renal dialysis: Secondary | ICD-10-CM | POA: Diagnosis not present

## 2016-11-10 DIAGNOSIS — E1022 Type 1 diabetes mellitus with diabetic chronic kidney disease: Secondary | ICD-10-CM | POA: Diagnosis not present

## 2016-11-10 DIAGNOSIS — N186 End stage renal disease: Secondary | ICD-10-CM | POA: Diagnosis not present

## 2016-11-11 DIAGNOSIS — N186 End stage renal disease: Secondary | ICD-10-CM | POA: Diagnosis not present

## 2016-11-11 DIAGNOSIS — N2581 Secondary hyperparathyroidism of renal origin: Secondary | ICD-10-CM | POA: Diagnosis not present

## 2016-11-13 DIAGNOSIS — N186 End stage renal disease: Secondary | ICD-10-CM | POA: Diagnosis not present

## 2016-11-13 DIAGNOSIS — N2581 Secondary hyperparathyroidism of renal origin: Secondary | ICD-10-CM | POA: Diagnosis not present

## 2016-11-14 DIAGNOSIS — M25562 Pain in left knee: Secondary | ICD-10-CM | POA: Diagnosis not present

## 2016-11-14 DIAGNOSIS — M25662 Stiffness of left knee, not elsewhere classified: Secondary | ICD-10-CM | POA: Diagnosis not present

## 2016-11-14 DIAGNOSIS — R262 Difficulty in walking, not elsewhere classified: Secondary | ICD-10-CM | POA: Diagnosis not present

## 2016-11-14 DIAGNOSIS — M6281 Muscle weakness (generalized): Secondary | ICD-10-CM | POA: Diagnosis not present

## 2016-11-15 DIAGNOSIS — N2581 Secondary hyperparathyroidism of renal origin: Secondary | ICD-10-CM | POA: Diagnosis not present

## 2016-11-15 DIAGNOSIS — N186 End stage renal disease: Secondary | ICD-10-CM | POA: Diagnosis not present

## 2016-11-18 DIAGNOSIS — N186 End stage renal disease: Secondary | ICD-10-CM | POA: Diagnosis not present

## 2016-11-18 DIAGNOSIS — N2581 Secondary hyperparathyroidism of renal origin: Secondary | ICD-10-CM | POA: Diagnosis not present

## 2016-11-20 DIAGNOSIS — N2581 Secondary hyperparathyroidism of renal origin: Secondary | ICD-10-CM | POA: Diagnosis not present

## 2016-11-20 DIAGNOSIS — N186 End stage renal disease: Secondary | ICD-10-CM | POA: Diagnosis not present

## 2016-11-22 DIAGNOSIS — N186 End stage renal disease: Secondary | ICD-10-CM | POA: Diagnosis not present

## 2016-11-22 DIAGNOSIS — N2581 Secondary hyperparathyroidism of renal origin: Secondary | ICD-10-CM | POA: Diagnosis not present

## 2016-11-25 DIAGNOSIS — E119 Type 2 diabetes mellitus without complications: Secondary | ICD-10-CM | POA: Diagnosis not present

## 2016-11-25 DIAGNOSIS — E7800 Pure hypercholesterolemia, unspecified: Secondary | ICD-10-CM | POA: Diagnosis not present

## 2016-11-25 DIAGNOSIS — N2581 Secondary hyperparathyroidism of renal origin: Secondary | ICD-10-CM | POA: Diagnosis not present

## 2016-11-25 DIAGNOSIS — N186 End stage renal disease: Secondary | ICD-10-CM | POA: Diagnosis not present

## 2016-11-25 DIAGNOSIS — I10 Essential (primary) hypertension: Secondary | ICD-10-CM | POA: Diagnosis not present

## 2016-11-27 DIAGNOSIS — N2581 Secondary hyperparathyroidism of renal origin: Secondary | ICD-10-CM | POA: Diagnosis not present

## 2016-11-27 DIAGNOSIS — N186 End stage renal disease: Secondary | ICD-10-CM | POA: Diagnosis not present

## 2016-11-29 DIAGNOSIS — N2581 Secondary hyperparathyroidism of renal origin: Secondary | ICD-10-CM | POA: Diagnosis not present

## 2016-11-29 DIAGNOSIS — N186 End stage renal disease: Secondary | ICD-10-CM | POA: Diagnosis not present

## 2016-12-02 DIAGNOSIS — N186 End stage renal disease: Secondary | ICD-10-CM | POA: Diagnosis not present

## 2016-12-02 DIAGNOSIS — N2581 Secondary hyperparathyroidism of renal origin: Secondary | ICD-10-CM | POA: Diagnosis not present

## 2016-12-04 DIAGNOSIS — N2581 Secondary hyperparathyroidism of renal origin: Secondary | ICD-10-CM | POA: Diagnosis not present

## 2016-12-04 DIAGNOSIS — N186 End stage renal disease: Secondary | ICD-10-CM | POA: Diagnosis not present

## 2016-12-06 DIAGNOSIS — N186 End stage renal disease: Secondary | ICD-10-CM | POA: Diagnosis not present

## 2016-12-06 DIAGNOSIS — N2581 Secondary hyperparathyroidism of renal origin: Secondary | ICD-10-CM | POA: Diagnosis not present

## 2016-12-09 DIAGNOSIS — N186 End stage renal disease: Secondary | ICD-10-CM | POA: Diagnosis not present

## 2016-12-09 DIAGNOSIS — N2581 Secondary hyperparathyroidism of renal origin: Secondary | ICD-10-CM | POA: Diagnosis not present

## 2016-12-11 DIAGNOSIS — N2581 Secondary hyperparathyroidism of renal origin: Secondary | ICD-10-CM | POA: Diagnosis not present

## 2016-12-11 DIAGNOSIS — N186 End stage renal disease: Secondary | ICD-10-CM | POA: Diagnosis not present

## 2016-12-11 DIAGNOSIS — Z992 Dependence on renal dialysis: Secondary | ICD-10-CM | POA: Diagnosis not present

## 2016-12-11 DIAGNOSIS — E1022 Type 1 diabetes mellitus with diabetic chronic kidney disease: Secondary | ICD-10-CM | POA: Diagnosis not present

## 2016-12-13 DIAGNOSIS — N186 End stage renal disease: Secondary | ICD-10-CM | POA: Diagnosis not present

## 2016-12-13 DIAGNOSIS — R197 Diarrhea, unspecified: Secondary | ICD-10-CM | POA: Diagnosis not present

## 2016-12-13 DIAGNOSIS — N2581 Secondary hyperparathyroidism of renal origin: Secondary | ICD-10-CM | POA: Diagnosis not present

## 2016-12-16 DIAGNOSIS — N2581 Secondary hyperparathyroidism of renal origin: Secondary | ICD-10-CM | POA: Diagnosis not present

## 2016-12-16 DIAGNOSIS — N186 End stage renal disease: Secondary | ICD-10-CM | POA: Diagnosis not present

## 2016-12-18 DIAGNOSIS — N2581 Secondary hyperparathyroidism of renal origin: Secondary | ICD-10-CM | POA: Diagnosis not present

## 2016-12-18 DIAGNOSIS — N186 End stage renal disease: Secondary | ICD-10-CM | POA: Diagnosis not present

## 2016-12-20 DIAGNOSIS — N186 End stage renal disease: Secondary | ICD-10-CM | POA: Diagnosis not present

## 2016-12-20 DIAGNOSIS — N2581 Secondary hyperparathyroidism of renal origin: Secondary | ICD-10-CM | POA: Diagnosis not present

## 2016-12-23 DIAGNOSIS — N2581 Secondary hyperparathyroidism of renal origin: Secondary | ICD-10-CM | POA: Diagnosis not present

## 2016-12-23 DIAGNOSIS — N186 End stage renal disease: Secondary | ICD-10-CM | POA: Diagnosis not present

## 2016-12-25 DIAGNOSIS — N2581 Secondary hyperparathyroidism of renal origin: Secondary | ICD-10-CM | POA: Diagnosis not present

## 2016-12-25 DIAGNOSIS — N186 End stage renal disease: Secondary | ICD-10-CM | POA: Diagnosis not present

## 2016-12-27 DIAGNOSIS — N2581 Secondary hyperparathyroidism of renal origin: Secondary | ICD-10-CM | POA: Diagnosis not present

## 2016-12-27 DIAGNOSIS — N186 End stage renal disease: Secondary | ICD-10-CM | POA: Diagnosis not present

## 2016-12-29 DIAGNOSIS — N186 End stage renal disease: Secondary | ICD-10-CM | POA: Diagnosis not present

## 2016-12-29 DIAGNOSIS — N2581 Secondary hyperparathyroidism of renal origin: Secondary | ICD-10-CM | POA: Diagnosis not present

## 2016-12-29 DIAGNOSIS — R197 Diarrhea, unspecified: Secondary | ICD-10-CM | POA: Diagnosis not present

## 2016-12-31 ENCOUNTER — Encounter: Payer: BLUE CROSS/BLUE SHIELD | Admitting: Family Medicine

## 2016-12-31 DIAGNOSIS — R197 Diarrhea, unspecified: Secondary | ICD-10-CM | POA: Diagnosis not present

## 2016-12-31 DIAGNOSIS — N186 End stage renal disease: Secondary | ICD-10-CM | POA: Diagnosis not present

## 2016-12-31 DIAGNOSIS — N2581 Secondary hyperparathyroidism of renal origin: Secondary | ICD-10-CM | POA: Diagnosis not present

## 2017-01-03 DIAGNOSIS — N186 End stage renal disease: Secondary | ICD-10-CM | POA: Diagnosis not present

## 2017-01-03 DIAGNOSIS — R197 Diarrhea, unspecified: Secondary | ICD-10-CM | POA: Diagnosis not present

## 2017-01-03 DIAGNOSIS — N2581 Secondary hyperparathyroidism of renal origin: Secondary | ICD-10-CM | POA: Diagnosis not present

## 2017-01-06 DIAGNOSIS — N186 End stage renal disease: Secondary | ICD-10-CM | POA: Diagnosis not present

## 2017-01-06 DIAGNOSIS — R197 Diarrhea, unspecified: Secondary | ICD-10-CM | POA: Diagnosis not present

## 2017-01-06 DIAGNOSIS — N2581 Secondary hyperparathyroidism of renal origin: Secondary | ICD-10-CM | POA: Diagnosis not present

## 2017-01-07 ENCOUNTER — Encounter: Payer: Self-pay | Admitting: Family Medicine

## 2017-01-07 ENCOUNTER — Ambulatory Visit (INDEPENDENT_AMBULATORY_CARE_PROVIDER_SITE_OTHER): Payer: BLUE CROSS/BLUE SHIELD | Admitting: Family Medicine

## 2017-01-07 VITALS — BP 120/94 | Ht 75.0 in | Wt 237.0 lb

## 2017-01-07 DIAGNOSIS — E1022 Type 1 diabetes mellitus with diabetic chronic kidney disease: Secondary | ICD-10-CM | POA: Diagnosis not present

## 2017-01-07 DIAGNOSIS — Z992 Dependence on renal dialysis: Secondary | ICD-10-CM

## 2017-01-07 DIAGNOSIS — N183 Chronic kidney disease, stage 3 unspecified: Secondary | ICD-10-CM

## 2017-01-07 DIAGNOSIS — I70243 Atherosclerosis of native arteries of left leg with ulceration of ankle: Secondary | ICD-10-CM

## 2017-01-07 DIAGNOSIS — I10 Essential (primary) hypertension: Secondary | ICD-10-CM | POA: Diagnosis not present

## 2017-01-07 DIAGNOSIS — Z0001 Encounter for general adult medical examination with abnormal findings: Secondary | ICD-10-CM

## 2017-01-07 DIAGNOSIS — Z0000 Encounter for general adult medical examination without abnormal findings: Secondary | ICD-10-CM

## 2017-01-07 DIAGNOSIS — Z125 Encounter for screening for malignant neoplasm of prostate: Secondary | ICD-10-CM | POA: Diagnosis not present

## 2017-01-07 DIAGNOSIS — Z13220 Encounter for screening for lipoid disorders: Secondary | ICD-10-CM | POA: Diagnosis not present

## 2017-01-07 DIAGNOSIS — N186 End stage renal disease: Secondary | ICD-10-CM | POA: Diagnosis not present

## 2017-01-07 DIAGNOSIS — Z79899 Other long term (current) drug therapy: Secondary | ICD-10-CM

## 2017-01-07 NOTE — Progress Notes (Signed)
   Subjective:    Patient ID: Lance [name], male    DOB: [birth year], 46 y.o.   MRN: [id number]  HPI  The patient comes in today for a wellness visit.    A review of their health history was completed.  A review of medications was also completed.  Any needed refills; No  Eating habits: Good  Falls/  MVA accidents in past few months: None  Regular exercise: Yes  Specialist pt sees on regular basis: Nephrologist [employer].  Preventative health issues were discussed.   Additional concerns: None   Does dialysis mon wed and fri   Day [phone]   Review of Systems  Constitutional: Negative for activity change, appetite change and fever.  HENT: Negative for congestion and rhinorrhea.   Eyes: Negative for discharge.  Respiratory: Negative for cough and wheezing.   Cardiovascular: Negative for chest pain.  Gastrointestinal: Negative for abdominal pain, blood in stool and vomiting.  Genitourinary: Negative for difficulty urinating and frequency.  Musculoskeletal: Negative for neck pain.  Skin: Negative for rash.  Allergic/Immunologic: Negative for environmental allergies and food allergies.  Neurological: Negative for weakness and headaches.  Psychiatric/Behavioral: Negative for agitation.  All other systems reviewed and are negative.      Objective:   Physical Exam  Constitutional: He appears well-developed and well-nourished.  HENT:  Head: Normocephalic and atraumatic.  Right Ear: External ear normal.  Left Ear: External ear normal.  Nose: Nose normal.  Mouth/Throat: Oropharynx is clear and moist.  Eyes: EOM are normal. Pupils are equal, round, and reactive to light.  Neck: Normal range of motion. Neck supple. No thyromegaly present.  Cardiovascular: Normal rate, regular rhythm and normal heart sounds.  No murmur heard. Pulmonary/Chest: Effort normal and breath sounds normal. No respiratory distress. He has no wheezes.  Abdominal: Soft. Bowel  sounds are normal. He exhibits no distension and no mass. There is no tenderness.  Genitourinary: Penis normal.  Musculoskeletal: Normal range of motion. He exhibits no edema.  Lymphadenopathy:    He has no cervical adenopathy.  Neurological: He is alert. He exhibits normal muscle tone.  Skin: Skin is warm and dry. No erythema.  Psychiatric: He has a normal mood and affect. His behavior is normal. Judgment normal.          Assessment & Plan:  Testing impression.  Wellness exam.  Patient up-to-date on vaccinations through dialysis.  Diet discussed.  Exercise discussed.  Mental health discussed.  2.  Hypertension ongoing compliant with meds occasional low blood pressures after dialysis  3.  Chronic renal failure on dialysis.  4.  Type 1 insulin-dependent diabetic patient.  His endocrinologist just retired.  Surprisingly they did not offer any type of referral for follow-up.  Patient is on some insulin shots per day.  Long discussion held.  Really with this type 1 diabetes he needs an ongoing endocrinologist.  We will refer him on 1

## 2017-01-08 DIAGNOSIS — N2581 Secondary hyperparathyroidism of renal origin: Secondary | ICD-10-CM | POA: Diagnosis not present

## 2017-01-08 DIAGNOSIS — R197 Diarrhea, unspecified: Secondary | ICD-10-CM | POA: Diagnosis not present

## 2017-01-08 DIAGNOSIS — N186 End stage renal disease: Secondary | ICD-10-CM | POA: Diagnosis not present

## 2017-01-10 DIAGNOSIS — N186 End stage renal disease: Secondary | ICD-10-CM | POA: Diagnosis not present

## 2017-01-10 DIAGNOSIS — Z992 Dependence on renal dialysis: Secondary | ICD-10-CM | POA: Diagnosis not present

## 2017-01-10 DIAGNOSIS — E1022 Type 1 diabetes mellitus with diabetic chronic kidney disease: Secondary | ICD-10-CM | POA: Diagnosis not present

## 2017-01-10 DIAGNOSIS — N2581 Secondary hyperparathyroidism of renal origin: Secondary | ICD-10-CM | POA: Diagnosis not present

## 2017-01-10 DIAGNOSIS — R197 Diarrhea, unspecified: Secondary | ICD-10-CM | POA: Diagnosis not present

## 2017-01-13 DIAGNOSIS — N186 End stage renal disease: Secondary | ICD-10-CM | POA: Diagnosis not present

## 2017-01-13 DIAGNOSIS — N2581 Secondary hyperparathyroidism of renal origin: Secondary | ICD-10-CM | POA: Diagnosis not present

## 2017-01-15 DIAGNOSIS — N186 End stage renal disease: Secondary | ICD-10-CM | POA: Diagnosis not present

## 2017-01-15 DIAGNOSIS — N2581 Secondary hyperparathyroidism of renal origin: Secondary | ICD-10-CM | POA: Diagnosis not present

## 2017-01-17 DIAGNOSIS — N2581 Secondary hyperparathyroidism of renal origin: Secondary | ICD-10-CM | POA: Diagnosis not present

## 2017-01-17 DIAGNOSIS — N186 End stage renal disease: Secondary | ICD-10-CM | POA: Diagnosis not present

## 2017-01-20 DIAGNOSIS — N2581 Secondary hyperparathyroidism of renal origin: Secondary | ICD-10-CM | POA: Diagnosis not present

## 2017-01-20 DIAGNOSIS — N186 End stage renal disease: Secondary | ICD-10-CM | POA: Diagnosis not present

## 2017-01-22 DIAGNOSIS — N186 End stage renal disease: Secondary | ICD-10-CM | POA: Diagnosis not present

## 2017-01-22 DIAGNOSIS — N2581 Secondary hyperparathyroidism of renal origin: Secondary | ICD-10-CM | POA: Diagnosis not present

## 2017-01-24 DIAGNOSIS — N2581 Secondary hyperparathyroidism of renal origin: Secondary | ICD-10-CM | POA: Diagnosis not present

## 2017-01-24 DIAGNOSIS — N186 End stage renal disease: Secondary | ICD-10-CM | POA: Diagnosis not present

## 2017-01-27 DIAGNOSIS — N2581 Secondary hyperparathyroidism of renal origin: Secondary | ICD-10-CM | POA: Diagnosis not present

## 2017-01-27 DIAGNOSIS — N186 End stage renal disease: Secondary | ICD-10-CM | POA: Diagnosis not present

## 2017-01-29 DIAGNOSIS — N2581 Secondary hyperparathyroidism of renal origin: Secondary | ICD-10-CM | POA: Diagnosis not present

## 2017-01-29 DIAGNOSIS — N186 End stage renal disease: Secondary | ICD-10-CM | POA: Diagnosis not present

## 2017-01-31 DIAGNOSIS — N2581 Secondary hyperparathyroidism of renal origin: Secondary | ICD-10-CM | POA: Diagnosis not present

## 2017-01-31 DIAGNOSIS — N186 End stage renal disease: Secondary | ICD-10-CM | POA: Diagnosis not present

## 2017-02-02 DIAGNOSIS — N186 End stage renal disease: Secondary | ICD-10-CM | POA: Diagnosis not present

## 2017-02-02 DIAGNOSIS — N2581 Secondary hyperparathyroidism of renal origin: Secondary | ICD-10-CM | POA: Diagnosis not present

## 2017-02-05 DIAGNOSIS — N2581 Secondary hyperparathyroidism of renal origin: Secondary | ICD-10-CM | POA: Diagnosis not present

## 2017-02-05 DIAGNOSIS — N186 End stage renal disease: Secondary | ICD-10-CM | POA: Diagnosis not present

## 2017-02-07 ENCOUNTER — Ambulatory Visit: Payer: BLUE CROSS/BLUE SHIELD | Admitting: "Endocrinology

## 2017-02-07 DIAGNOSIS — N186 End stage renal disease: Secondary | ICD-10-CM | POA: Diagnosis not present

## 2017-02-07 DIAGNOSIS — N2581 Secondary hyperparathyroidism of renal origin: Secondary | ICD-10-CM | POA: Diagnosis not present

## 2017-02-09 DIAGNOSIS — N2581 Secondary hyperparathyroidism of renal origin: Secondary | ICD-10-CM | POA: Diagnosis not present

## 2017-02-09 DIAGNOSIS — R197 Diarrhea, unspecified: Secondary | ICD-10-CM | POA: Diagnosis not present

## 2017-02-09 DIAGNOSIS — N186 End stage renal disease: Secondary | ICD-10-CM | POA: Diagnosis not present

## 2017-02-10 DIAGNOSIS — E1022 Type 1 diabetes mellitus with diabetic chronic kidney disease: Secondary | ICD-10-CM | POA: Diagnosis not present

## 2017-02-10 DIAGNOSIS — Z992 Dependence on renal dialysis: Secondary | ICD-10-CM | POA: Diagnosis not present

## 2017-02-10 DIAGNOSIS — N186 End stage renal disease: Secondary | ICD-10-CM | POA: Diagnosis not present

## 2017-02-12 DIAGNOSIS — N2581 Secondary hyperparathyroidism of renal origin: Secondary | ICD-10-CM | POA: Diagnosis not present

## 2017-02-12 DIAGNOSIS — N186 End stage renal disease: Secondary | ICD-10-CM | POA: Diagnosis not present

## 2017-02-12 DIAGNOSIS — R197 Diarrhea, unspecified: Secondary | ICD-10-CM | POA: Diagnosis not present

## 2017-02-14 DIAGNOSIS — N186 End stage renal disease: Secondary | ICD-10-CM | POA: Diagnosis not present

## 2017-02-14 DIAGNOSIS — R197 Diarrhea, unspecified: Secondary | ICD-10-CM | POA: Diagnosis not present

## 2017-02-14 DIAGNOSIS — N2581 Secondary hyperparathyroidism of renal origin: Secondary | ICD-10-CM | POA: Diagnosis not present

## 2017-02-17 DIAGNOSIS — N2581 Secondary hyperparathyroidism of renal origin: Secondary | ICD-10-CM | POA: Diagnosis not present

## 2017-02-17 DIAGNOSIS — N186 End stage renal disease: Secondary | ICD-10-CM | POA: Diagnosis not present

## 2017-02-18 ENCOUNTER — Encounter: Payer: Self-pay | Admitting: "Endocrinology

## 2017-02-18 ENCOUNTER — Other Ambulatory Visit: Payer: Self-pay | Admitting: "Endocrinology

## 2017-02-18 ENCOUNTER — Ambulatory Visit (INDEPENDENT_AMBULATORY_CARE_PROVIDER_SITE_OTHER): Payer: BLUE CROSS/BLUE SHIELD | Admitting: "Endocrinology

## 2017-02-18 VITALS — BP 140/83 | HR 86 | Ht 75.0 in | Wt 237.0 lb

## 2017-02-18 DIAGNOSIS — N186 End stage renal disease: Secondary | ICD-10-CM

## 2017-02-18 DIAGNOSIS — E1022 Type 1 diabetes mellitus with diabetic chronic kidney disease: Secondary | ICD-10-CM

## 2017-02-18 NOTE — Progress Notes (Signed)
Consult Note       02/18/2017, 5:28 PM   Subjective:    Patient ID: Lance [name], male    DOB: 08-Jan-1971.  Lance [name] is being seen in consultation for management of currently uncontrolled symptomatic diabetes requested by  [name], MD.   Past Medical History:  Diagnosis Date  . Aortic stenosis    Very mild in 05/2007  . Arthritis    "right shoulder; right knee; feel like I've got it all over" (11/28/2015)  . Degenerative joint disease    Left TKA  . Diastolic heart failure (HCC)    LVEF 65-70% with grade 2 diastolic dysfunction  . ESRD (end stage renal disease) on dialysis ([place])    "MWF; [facility] on [address]" (11/28/2015)  . Essential hypertension, benign 2000   LVH  . GERD (gastroesophageal reflux disease)   . HCAP (healthcare-associated pneumonia) 11/28/2015  . Hyperlipidemia 2000  . Loculated pleural effusion 11/28/2015   [name] 11/28/2015  . Pneumonia 12/2013; 05/2014  . PSVT (paroxysmal supraventricular tachycardia) (HCC)    Nonsustained; asymptomatic; diagnosed by event recorder in 2006  . Tobacco abuse, in remission    20 pack years; discontinued in 1985; bronchitic changes on chest x-ray and 2009  . Type 1 diabetes mellitus ([place]) 1990   Past Surgical History:  Procedure Laterality Date  . AV FISTULA PLACEMENT Right 06/23/2014   Procedure: ARTERIOVENOUS (AV) FISTULA CREATION;  Surgeon: Angelia Mould, MD;  Location: [facility];  Service: Vascular;  Laterality: Right;  . EYE SURGERY Bilateral    "for glaucoma"  . INGUINAL HERNIA REPAIR Right    As a child  . WISDOM TOOTH EXTRACTION  1987   Social History   Socioeconomic History  . Marital status: Married    Spouse name: None  . Number of children: None  . Years of education: None  . Highest education level: None  Social Needs  . Financial resource strain: None  . Food insecurity - worry: None  .  Food insecurity - inability: None  . Transportation needs - medical: None  . Transportation needs - non-medical: None  Occupational History  . None  Tobacco Use  . Smoking status: Never Smoker  . Smokeless tobacco: Never Used  Substance and Sexual Activity  . Alcohol use: Yes    Comment: 11/28/2015 "used to drink; stopped ~ 2 yr ago"  . Drug use: No  . Sexual activity: Yes  Other Topics Concern  . None  Social History Narrative  . None   Outpatient Encounter Medications as of 02/18/2017  Medication Sig  . lisinopril (PRINIVIL,ZESTRIL) 20 MG tablet Take by mouth.  Marland Kitchen amLODipine (NORVASC) 10 MG tablet TAKE 1 TABLET BY MOUTH EVERY DAY  . ferric citrate (AURYXIA) 1 GM 210 MG(Fe) tablet Take by mouth.  Marland Kitchen HUMALOG KWIKPEN 100 UNIT/ML KiwkPen Inject 10-16 Units into the skin at bedtime.  Marland Kitchen LANTUS 100 UNIT/ML injection Inject 30 Units into the skin at bedtime.  . [DISCONTINUED] insulin aspart (NOVOLOG) 100 UNIT/ML injection Inject 10 Units into the skin 3 (three) times daily before meals. (Patient taking differently: Inject 10-14 Units  into the skin 3 (three) times daily before meals. Per sliding scale)   No facility-administered encounter medications on file as of 02/18/2017.     ALLERGIES: Allergies  Allergen Reactions  . Atorvastatin Other (See Comments)    Myalgias  . Minoxidil Other (See Comments)    Fluid retention    VACCINATION STATUS: Immunization History  Administered Date(s) Administered  . Influenza,inj,Quad PF,6+ Mos 05/06/2014  . Pneumococcal Polysaccharide-23 05/06/2014    Diabetes  He presents for his initial diabetic visit. He has type 1 diabetes mellitus. Onset time: He was diagnosed at approximate age of 15 years with type 1 diabetes. His disease course has been worsening. There are no hypoglycemic associated symptoms. Pertinent negatives for hypoglycemia include no confusion, headaches, pallor or seizures. Associated symptoms include polydipsia and polyuria.  Pertinent negatives for diabetes include no chest pain, no fatigue, no polyphagia and no weakness. There are no hypoglycemic complications. Diabetic complications include nephropathy. (His diabetes completed about into space renal disease currently on hemodialysis for the last 4-5 years. Patient is also on  Kidney transplant list.) Risk factors for coronary artery disease include diabetes mellitus, hypertension and sedentary lifestyle. Current diabetic treatment includes insulin injections. His weight is increasing steadily. He is following a generally unhealthy diet. He has not had a previous visit with a dietitian. He never participates in exercise. (He did not bring any meter nor logs to review today. His most recent A1c was from verbal report from nephrology at 8.6%. It isn't electronic medical records show A1c's of 9.8%, 9.7%, and 11.2% between 2015 - 2017.) An ACE inhibitor/angiotensin II receptor blocker is being taken. Eye exam is current.  Hypertension  This is a chronic problem. The current episode started more than 1 year ago. The problem is controlled. Pertinent negatives include no chest pain, headaches, neck pain, palpitations or shortness of breath. Risk factors for coronary artery disease include diabetes mellitus and sedentary lifestyle. Past treatments include ACE inhibitors. Hypertensive end-organ damage includes kidney disease.      Review of Systems  Constitutional: Negative for chills, fatigue, fever and unexpected weight change.  HENT: Negative for dental problem, mouth sores and trouble swallowing.   Eyes: Negative for visual disturbance.  Respiratory: Negative for cough, choking, chest tightness, shortness of breath and wheezing.   Cardiovascular: Negative for chest pain, palpitations and leg swelling.  Gastrointestinal: Negative for abdominal distention, abdominal pain, constipation, diarrhea, nausea and vomiting.  Endocrine: Positive for polydipsia and polyuria. Negative for  polyphagia.  Genitourinary: Negative for dysuria, flank pain, hematuria and urgency.  Musculoskeletal: Negative for back pain, gait problem, myalgias and neck pain.  Skin: Negative for pallor, rash and wound.  Neurological: Negative for seizures, syncope, weakness, numbness and headaches.  Psychiatric/Behavioral: Negative for confusion and dysphoric mood.    Objective:    BP 140/83   Pulse 86   Ht 6\' 3"  (1.905 m)   Wt 237 lb (107.5 kg)   BMI 29.62 kg/m   Wt Readings from Last 3 Encounters:  02/18/17 237 lb (107.5 kg)  01/07/17 237 lb (107.5 kg)  02/27/16 217 lb (98.4 kg)     Physical Exam  Constitutional: He is oriented to person, place, and time. He appears well-developed. He is cooperative. No distress.  HENT:  Head: Normocephalic and atraumatic.  Eyes: EOM are normal.  Neck: Normal range of motion. Neck supple. No tracheal deviation present. No thyromegaly present.  Cardiovascular: Normal rate, S1 normal, S2 normal and normal heart sounds. Exam reveals no gallop.  No murmur heard. Pulses:      Dorsalis pedis pulses are 1+ on the right side, and 1+ on the left side.       Posterior tibial pulses are 1+ on the right side, and 1+ on the left side.  Pulmonary/Chest: Breath sounds normal. No respiratory distress. He has no wheezes.  Abdominal: Soft. Bowel sounds are normal. He exhibits no distension. There is no tenderness. There is no guarding and no CVA tenderness.  Musculoskeletal: He exhibits no edema.       Right shoulder: He exhibits no swelling and no deformity.  Neurological: He is alert and oriented to person, place, and time. He has normal strength and normal reflexes. No cranial nerve deficit or sensory deficit. Gait normal.  Skin: Skin is warm and dry. No rash noted. No cyanosis. Nails show no clubbing.  Psychiatric: He has a normal mood and affect. His speech is normal and behavior is normal. Cognition and memory are normal.   CMP ( most recent) CMP      Component Value Date/Time   NA 131 (L) 12/04/2015 1230   K 4.8 12/04/2015 1230   CL 94 (L) 12/04/2015 1230   CO2 24 12/04/2015 1230   GLUCOSE 177 (H) 12/04/2015 1230   BUN 33 (H) 12/04/2015 1230   CREATININE 11.01 (H) 12/04/2015 1230   CREATININE 2.21 (H) 03/26/2013 1520   CALCIUM 8.8 (L) 12/04/2015 1230   PROT 7.6 12/04/2015 1230   ALBUMIN 2.1 (L) 12/04/2015 1230   AST 16 12/04/2015 1230   ALT 12 (L) 12/04/2015 1230   ALKPHOS 85 12/04/2015 1230   BILITOT 0.9 12/04/2015 1230   GFRNONAA 5 (L) 12/04/2015 1230   GFRAA 6 (L) 12/04/2015 1230     Diabetic Labs (most recent): Lab Results  Component Value Date   HGBA1C 9.8 (H) 11/28/2015   HGBA1C 9.7 (H) 12/26/2013   HGBA1C 11.2 (H) 09/26/2010     Lipid Panel ( most recent) Lipid Panel     Component Value Date/Time   CHOL 174 12/28/2013 0707   TRIG 229 (H) 12/28/2013 0707   HDL 39 (L) 12/28/2013 0707   CHOLHDL 4.5 12/28/2013 0707   VLDL 46 (H) 12/28/2013 0707   LDLCALC 89 12/28/2013 0707      Lab Results  Component Value Date   TSH 1.760 12/26/2013     Assessment & Plan:   1. Type 1 diabetes mellitus with end-stage renal disease ([place])  - Lance [name] has currently uncontrolled symptomatic type 1 DM since  47 years of age,  with  most recent documented A1c of 9.7 %. - No recent labs to review, he is due for repeat labs, would be sent for new set of labs today. -his diabetes is complicated by end-stage renal disease on hemodialysis/kidney transplant list and Lance [name] remains at a high risk for more acute and chronic complications which include CAD, CVA, retinopathy, and neuropathy. These are all discussed in detail with the patient.  - I have counseled him on diet management  by adopting a carbohydrate restricted/protein rich diet.  - Suggestion is made for him to avoid simple carbohydrates  from his diet including Cakes, Sweet Desserts, Ice Cream, Soda (diet and regular), Sweet Tea, Candies,  Chips, Cookies, Store Bought Juices, Alcohol in Excess of  1-2 drinks a day, Artificial Sweeteners, and "Sugar-free" Products. This will help patient to have stable blood glucose profile and potentially avoid unintended weight gain.  - I encouraged him to  switch to  unprocessed or minimally processed complex starch and increased protein intake (animal or plant source), fruits, and vegetables.  - he is advised to stick to a routine mealtimes to eat 3 meals  a day and avoid unnecessary snacks ( to snack only to correct hypoglycemia).   - he will be scheduled with Jearld Fenton, RDN, CDE for individualized diabetes education.  - I have approached him with the following individualized plan to manage diabetes and patient agrees:   - Given his chronic glycemic burden , he will continue to require intensive treatment with basal/bolus insulin.   - Patient reports history of uncontrolled diabetes and hypertension as a cause of ESRD, currently appears to be motivated to control his diabetes to target.  -I  will proceed to readjust his basal insulin Lantus at 30 units daily at bedtime , and prandial insulin Humalog 10  units 3 times a day with meals  for pre-meal BG readings of 90-150mg /dl, plus patient specific correction dose for unexpected hyperglycemia above 150mg /dl, associated with strict monitoring of glucose 4 times a day-before meals and at bedtime. - Patient is warned not to take insulin without proper monitoring per orders. -Adjustment parameters are given for hypo and hyperglycemia in writing. -Patient is encouraged to call clinic for blood glucose levels less than 70 or above 300 mg /dl. - Insulin is discussing choice of therapy for him. He'll benefit from continuous glucose monitoring, will be considered for prescription for the Freestyle libre device after next visit.  - Patient specific target  A1c;  LDL, HDL, Triglycerides, and  Waist Circumference were discussed in detail.  2) BP/HTN:  Controlled. Continue current medications including ACEI/ARB. 3) Lipids/HPL: Recent  Lipid panel unknown. he is not on statins. 4)  Weight/Diet: CDE Consult will be initiated , exercise, and detailed carbohydrates information provided.  5) Chronic Care/Health Maintenance:  -he  is on ACEI medications and  is encouraged to continue to follow up with Ophthalmology, Dentist, nephrology,  Podiatrist at least yearly or according to recommendations, and advised to  stay away from smoking. I have recommended yearly flu vaccine and pneumonia vaccination at least every 5 years; moderate intensity exercise for up to 150 minutes weekly; and  sleep for at least 7 hours a day.  - I advised patient to maintain close follow up with [name], MD for primary care needs.  - Time spent with the patient: 1 hour, of which >50% was spent in obtaining information about his symptoms, reviewing his previous labs, evaluations, and treatments, counseling him about his  currently uncontrolled, because of type 1 diabetes, hypertension, and developing a plan for long term treatment; his  questions were answered to his satisfaction.  Follow up plan: - Return in about 1 week (around 02/25/2017) for meter, and logs, labs today.  Glade Lloyd, MD [facility] [address] [place], [place] [address] Phone: [phone]  Fax: [phone]    02/18/2017, 5:28 PM  This note was partially dictated with voice recognition software. Similar sounding words can be transcribed inadequately or may not  be corrected upon review.

## 2017-02-18 NOTE — Patient Instructions (Signed)

## 2017-02-19 DIAGNOSIS — N186 End stage renal disease: Secondary | ICD-10-CM | POA: Diagnosis not present

## 2017-02-19 DIAGNOSIS — N2581 Secondary hyperparathyroidism of renal origin: Secondary | ICD-10-CM | POA: Diagnosis not present

## 2017-02-19 LAB — COMPREHENSIVE METABOLIC PANEL
ALT: 24 IU/L (ref 0–44)
AST: 15 IU/L (ref 0–40)
Albumin/Globulin Ratio: 1.1 — ABNORMAL LOW (ref 1.2–2.2)
Albumin: 4.4 g/dL (ref 3.5–5.5)
Alkaline Phosphatase: 161 IU/L — ABNORMAL HIGH (ref 39–117)
BILIRUBIN TOTAL: 0.5 mg/dL (ref 0.0–1.2)
BUN/Creatinine Ratio: 4 — ABNORMAL LOW (ref 9–20)
BUN: 46 mg/dL — ABNORMAL HIGH (ref 6–24)
CALCIUM: 9 mg/dL (ref 8.7–10.2)
CHLORIDE: 88 mmol/L — AB (ref 96–106)
CO2: 24 mmol/L (ref 20–29)
Creatinine, Ser: 11.23 mg/dL — ABNORMAL HIGH (ref 0.76–1.27)
GFR, EST AFRICAN AMERICAN: 6 mL/min/{1.73_m2} — AB (ref >59)
GFR, EST NON AFRICAN AMERICAN: 5 mL/min/{1.73_m2} — AB (ref >59)
GLOBULIN, TOTAL: 3.9 g/dL (ref 1.5–4.5)
Glucose: 494 mg/dL — ABNORMAL HIGH (ref 65–99)
POTASSIUM: 5.7 mmol/L — AB (ref 3.5–5.2)
SODIUM: 131 mmol/L — AB (ref 134–144)
TOTAL PROTEIN: 8.3 g/dL (ref 6.0–8.5)

## 2017-02-19 LAB — SPECIMEN STATUS REPORT

## 2017-02-19 LAB — HGB A1C W/O EAG: Hgb A1c MFr Bld: 9.1 % — ABNORMAL HIGH (ref 4.8–5.6)

## 2017-02-19 LAB — T4, FREE: Free T4: 1 ng/dL (ref 0.82–1.77)

## 2017-02-19 LAB — TSH: TSH: 1.39 u[IU]/mL (ref 0.450–4.500)

## 2017-02-21 DIAGNOSIS — N2581 Secondary hyperparathyroidism of renal origin: Secondary | ICD-10-CM | POA: Diagnosis not present

## 2017-02-21 DIAGNOSIS — N186 End stage renal disease: Secondary | ICD-10-CM | POA: Diagnosis not present

## 2017-02-24 DIAGNOSIS — N186 End stage renal disease: Secondary | ICD-10-CM | POA: Diagnosis not present

## 2017-02-24 DIAGNOSIS — N2581 Secondary hyperparathyroidism of renal origin: Secondary | ICD-10-CM | POA: Diagnosis not present

## 2017-02-26 DIAGNOSIS — N2581 Secondary hyperparathyroidism of renal origin: Secondary | ICD-10-CM | POA: Diagnosis not present

## 2017-02-26 DIAGNOSIS — N186 End stage renal disease: Secondary | ICD-10-CM | POA: Diagnosis not present

## 2017-02-27 ENCOUNTER — Encounter: Payer: Self-pay | Admitting: "Endocrinology

## 2017-02-27 ENCOUNTER — Ambulatory Visit (INDEPENDENT_AMBULATORY_CARE_PROVIDER_SITE_OTHER): Payer: BLUE CROSS/BLUE SHIELD | Admitting: "Endocrinology

## 2017-02-27 VITALS — BP 108/74 | HR 103 | Ht 75.0 in | Wt 236.0 lb

## 2017-02-27 DIAGNOSIS — E1022 Type 1 diabetes mellitus with diabetic chronic kidney disease: Secondary | ICD-10-CM

## 2017-02-27 DIAGNOSIS — N186 End stage renal disease: Secondary | ICD-10-CM

## 2017-02-27 MED ORDER — FREESTYLE LIBRE SENSOR SYSTEM MISC: 2 refills | Status: DC

## 2017-02-27 MED ORDER — FREESTYLE LIBRE READER DEVI: 1.0000 | Freq: Once | 0 refills | Status: AC

## 2017-02-27 NOTE — Progress Notes (Signed)
Consult Note       02/27/2017, 5:15 PM   Subjective:    Patient ID: Lance [name], male    DOB: Dec 11, 1970.  Lance [name] is being seen in consultation for management of currently uncontrolled symptomatic diabetes requested by  [name], MD.   Past Medical History:  Diagnosis Date  . Aortic stenosis    Very mild in 05/2007  . Arthritis    "right shoulder; right knee; feel like I've got it all over" (11/28/2015)  . Degenerative joint disease    Left TKA  . Diastolic heart failure (HCC)    LVEF 65-70% with grade 2 diastolic dysfunction  . ESRD (end stage renal disease) on dialysis ([place])    "MWF; [facility] on [address]" (11/28/2015)  . Essential hypertension, benign 2000   LVH  . GERD (gastroesophageal reflux disease)   . HCAP (healthcare-associated pneumonia) 11/28/2015  . Hyperlipidemia 2000  . Loculated pleural effusion 11/28/2015   [name] 11/28/2015  . Pneumonia 12/2013; 05/2014  . PSVT (paroxysmal supraventricular tachycardia) (HCC)    Nonsustained; asymptomatic; diagnosed by event recorder in 2006  . Tobacco abuse, in remission    20 pack years; discontinued in 1985; bronchitic changes on chest x-ray and 2009  . Type 1 diabetes mellitus ([place]) 1990   Past Surgical History:  Procedure Laterality Date  . AV FISTULA PLACEMENT Right 06/23/2014   Procedure: ARTERIOVENOUS (AV) FISTULA CREATION;  Surgeon: Angelia Mould, MD;  Location: [facility];  Service: Vascular;  Laterality: Right;  . EYE SURGERY Bilateral    "for glaucoma"  . INGUINAL HERNIA REPAIR Right    As a child  . WISDOM TOOTH EXTRACTION  1987   Social History   Socioeconomic History  . Marital status: Married    Spouse name: None  . Number of children: None  . Years of education: None  . Highest education level: None  Social Needs  . Financial resource strain: None  . Food insecurity - worry: None  .  Food insecurity - inability: None  . Transportation needs - medical: None  . Transportation needs - non-medical: None  Occupational History  . None  Tobacco Use  . Smoking status: Never Smoker  . Smokeless tobacco: Never Used  Substance and Sexual Activity  . Alcohol use: Yes    Comment: 11/28/2015 "used to drink; stopped ~ 2 yr ago"  . Drug use: No  . Sexual activity: Yes  Other Topics Concern  . None  Social History Narrative  . None   Outpatient Encounter Medications as of 02/27/2017  Medication Sig  . amLODipine (NORVASC) 10 MG tablet TAKE 1 TABLET BY MOUTH EVERY DAY  . Continuous Blood Gluc Receiver (FREESTYLE LIBRE READER) DEVI 1 Piece by Does not apply route once for 1 dose.  . Continuous Blood Gluc Sensor (FREESTYLE LIBRE SENSOR SYSTEM) MISC Use one sensor every 10 days.  . ferric citrate (AURYXIA) 1 GM 210 MG(Fe) tablet Take by mouth.  Marland Kitchen HUMALOG KWIKPEN 100 UNIT/ML KiwkPen Inject 12-18 Units into the skin 3 (three) times daily before meals.  Marland Kitchen LANTUS 100 UNIT/ML injection Inject 30 Units  into the skin at bedtime.  Marland Kitchen lisinopril (PRINIVIL,ZESTRIL) 20 MG tablet Take by mouth.   No facility-administered encounter medications on file as of 02/27/2017.     ALLERGIES: Allergies  Allergen Reactions  . Atorvastatin Other (See Comments)    Myalgias  . Minoxidil Other (See Comments)    Fluid retention    VACCINATION STATUS: Immunization History  Administered Date(s) Administered  . Influenza,inj,Quad PF,6+ Mos 05/06/2014  . Pneumococcal Polysaccharide-23 05/06/2014    Diabetes  He presents for his follow-up diabetic visit. He has type 1 diabetes mellitus. Onset time: He was diagnosed at approximate age of 47 years with type 1 diabetes. His disease course has been worsening. There are no hypoglycemic associated symptoms. Pertinent negatives for hypoglycemia include no confusion, headaches, pallor or seizures. Associated symptoms include polydipsia and polyuria. Pertinent  negatives for diabetes include no chest pain, no fatigue, no polyphagia and no weakness. There are no hypoglycemic complications. Symptoms are improving. Diabetic complications include nephropathy. (His diabetes completed about into space renal disease currently on hemodialysis for the last 4-5 years. Patient is also on  Kidney transplant list.) Risk factors for coronary artery disease include diabetes mellitus, hypertension and sedentary lifestyle. Current diabetic treatment includes insulin injections. His weight is stable. He is following a generally unhealthy diet. He has not had a previous visit with a dietitian. He never participates in exercise. His breakfast blood glucose range is generally 140-180 mg/dl. His lunch blood glucose range is generally >200 mg/dl. His dinner blood glucose range is generally >200 mg/dl. His bedtime blood glucose range is generally >200 mg/dl. His overall blood glucose range is >200 mg/dl. ( his  electronic medical records show A1c's of 9.8%, 9.7%, and 11.2% between 2015 - 2017.) An ACE inhibitor/angiotensin II receptor blocker is being taken. Eye exam is current.  Hypertension  This is a chronic problem. The current episode started more than 1 year ago. The problem is controlled. Pertinent negatives include no chest pain, headaches, neck pain, palpitations or shortness of breath. Risk factors for coronary artery disease include diabetes mellitus and sedentary lifestyle. Past treatments include ACE inhibitors. Hypertensive end-organ damage includes kidney disease.     Review of Systems  Constitutional: Negative for chills, fatigue, fever and unexpected weight change.  HENT: Negative for dental problem, mouth sores and trouble swallowing.   Eyes: Negative for visual disturbance.  Respiratory: Negative for cough, choking, chest tightness, shortness of breath and wheezing.   Cardiovascular: Negative for chest pain, palpitations and leg swelling.  Gastrointestinal: Negative  for abdominal distention, abdominal pain, constipation, diarrhea, nausea and vomiting.  Endocrine: Positive for polydipsia and polyuria. Negative for polyphagia.  Genitourinary: Negative for dysuria, flank pain, hematuria and urgency.  Musculoskeletal: Negative for back pain, gait problem, myalgias and neck pain.  Skin: Negative for pallor, rash and wound.  Neurological: Negative for seizures, syncope, weakness, numbness and headaches.  Psychiatric/Behavioral: Negative for confusion and dysphoric mood.    Objective:    BP 108/74   Pulse (!) 103   Ht 6\' 3"  (1.905 m)   Wt 236 lb (107 kg)   BMI 29.50 kg/m   Wt Readings from Last 3 Encounters:  02/27/17 236 lb (107 kg)  02/18/17 237 lb (107.5 kg)  01/07/17 237 lb (107.5 kg)     Physical Exam  Constitutional: He is oriented to person, place, and time. He appears well-developed. He is cooperative. No distress.  HENT:  Head: Normocephalic and atraumatic.  Eyes: EOM are normal.  Neck: Normal range  of motion. Neck supple. No tracheal deviation present. No thyromegaly present.  Cardiovascular: Normal rate, S1 normal, S2 normal and normal heart sounds. Exam reveals no gallop.  No murmur heard. Pulses:      Dorsalis pedis pulses are 1+ on the right side, and 1+ on the left side.       Posterior tibial pulses are 1+ on the right side, and 1+ on the left side.  Pulmonary/Chest: Breath sounds normal. No respiratory distress. He has no wheezes.  Abdominal: Soft. Bowel sounds are normal. He exhibits no distension. There is no tenderness. There is no guarding and no CVA tenderness.  Musculoskeletal: He exhibits no edema.       Right shoulder: He exhibits no swelling and no deformity.  Neurological: He is alert and oriented to person, place, and time. He has normal strength and normal reflexes. No cranial nerve deficit or sensory deficit. Gait normal.  Skin: Skin is warm and dry. No rash noted. No cyanosis. Nails show no clubbing.  Psychiatric:  He has a normal mood and affect. His speech is normal and behavior is normal. Cognition and memory are normal.   CMP ( most recent) CMP     Component Value Date/Time   NA 131 (L) 02/18/2017 1438   K 5.7 (H) 02/18/2017 1438   CL 88 (L) 02/18/2017 1438   CO2 24 02/18/2017 1438   GLUCOSE 494 (H) 02/18/2017 1438   GLUCOSE 177 (H) 12/04/2015 1230   BUN 46 (H) 02/18/2017 1438   CREATININE 11.23 (H) 02/18/2017 1438   CREATININE 2.21 (H) 03/26/2013 1520   CALCIUM 9.0 02/18/2017 1438   PROT 8.3 02/18/2017 1438   ALBUMIN 4.4 02/18/2017 1438   AST 15 02/18/2017 1438   ALT 24 02/18/2017 1438   ALKPHOS 161 (H) 02/18/2017 1438   BILITOT 0.5 02/18/2017 1438   GFRNONAA 5 (L) 02/18/2017 1438   GFRAA 6 (L) 02/18/2017 1438     Diabetic Labs (most recent): Lab Results  Component Value Date   HGBA1C 9.1 (H) 02/18/2017   HGBA1C 9.8 (H) 11/28/2015   HGBA1C 9.7 (H) 12/26/2013     Lipid Panel ( most recent) Lipid Panel     Component Value Date/Time   CHOL 174 12/28/2013 0707   TRIG 229 (H) 12/28/2013 0707   HDL 39 (L) 12/28/2013 0707   CHOLHDL 4.5 12/28/2013 0707   VLDL 46 (H) 12/28/2013 0707   LDLCALC 89 12/28/2013 0707      Lab Results  Component Value Date   TSH 1.390 02/18/2017   TSH 1.760 12/26/2013   FREET4 1.00 02/18/2017     Assessment & Plan:   1. Type 1 diabetes mellitus with end-stage renal disease ([place])  - Lance [name] has currently uncontrolled symptomatic type 1 DM since  47 years of age,  with  most recent documented A1c of 9.6%. - he came with relatively controlled fasting glucose profile, still significantly above target postprandial blood glucose readings.  -his diabetes is complicated by end-stage renal disease on hemodialysis/kidney transplant list and Lance [name] remains at a high risk for more acute and chronic complications which include CAD, CVA, retinopathy, and neuropathy. These are all discussed in detail with the patient.  - I have  counseled him on diet management  by adopting a carbohydrate restricted/protein rich diet.  -  Suggestion is made for him to avoid simple carbohydrates  from his diet including Cakes, Sweet Desserts / Pastries, Ice Cream, Soda (diet and regular), Sweet  Tea, Candies, Chips, Cookies, Store Bought Juices, Alcohol in Excess of  1-2 drinks a day, Artificial Sweeteners, and "Sugar-free" Products. This will help patient to have stable blood glucose profile and potentially avoid unintended weight gain.   - I encouraged him to switch to  unprocessed or minimally processed complex starch and increased protein intake (animal or plant source), fruits, and vegetables.  - he is advised to stick to a routine mealtimes to eat 3 meals  a day and avoid unnecessary snacks ( to snack only to correct hypoglycemia).   - he will be scheduled with Jearld Fenton, RDN, CDE for individualized diabetes education- consult pending.  - I have approached him with the following individualized plan to manage diabetes and patient agrees:   - Given his chronic glycemic burden , he will continue to require intensive treatment with basal/bolus insulin.   - Patient reports history of uncontrolled diabetes and hypertension as a cause of ESRD, currently appears to be motivated to control his diabetes to target.  -I  will continue Lantus at 30 units daily at bedtime , and  increase his Humalog to 12  units 3 times a day with meals  for pre-meal BG readings of 90-150mg /dl, plus patient specific correction dose for unexpected hyperglycemia above 150mg /dl, associated with strict monitoring of glucose 4 times a day-before meals and at bedtime. - Patient is warned not to take insulin without proper monitoring per orders. -Adjustment parameters are given for hypo and hyperglycemia in writing. -Patient is encouraged to call clinic for blood glucose levels less than 70 or above 300 mg /dl.  - Insulin is discussing choice of therapy for him.  He'll benefit from continuous glucose monitoring,   I discussed and prescribed CGM DEVICE AND SENSOR FOR HIM.   - Patient specific target  A1c;  LDL, HDL, Triglycerides, and  Waist Circumference were discussed in detail.  2) BP/HTN:  His blood pressure is controlled to target. I advised him to continue his current blood pressure medications including ACEI/ARB.  3) Lipids/HPL: Recent  Lipid panel   shows LDL at 89. He he is marked as allergic to statins.   4)  Weight/Diet: CDE Consult has been  initiated , exercise, and detailed carbohydrates information provided.  5) Chronic Care/Health Maintenance:  -he  is on ACEI medications and  is encouraged to continue to follow up with Ophthalmology, Dentist, nephrology,  Podiatrist at least yearly or according to recommendations, and advised to  stay away from smoking. I have recommended yearly flu vaccine and pneumonia vaccination at least every 5 years; moderate intensity exercise for up to 150 minutes weekly; and  sleep for at least 7 hours a day.  - I advised patient to maintain close follow up with [name], MD for primary care needs.  - Time spent with the patient: 25 min, of which >50% was spent in reviewing his blood glucose logs , discussing his hypo- and hyper-glycemic episodes, reviewing his current and  previous labs and insulin doses and developing a plan to avoid hypo- and hyper-glycemia. Please refer to Patient Instructions for Blood Glucose Monitoring and Insulin/Medications Dosing Guide"  in media tab for additional information.   Follow up plan: - Return in about 3 months (around 05/28/2017) for meter, and logs.  Glade Lloyd, MD [facility] [address] [place], [place] [address] Phone: [phone]  Fax: [phone]    02/27/2017, 5:15 PM  This note was partially dictated with voice recognition  software. Similar sounding words can be transcribed inadequately or may  not  be corrected upon review.

## 2017-02-27 NOTE — Patient Instructions (Signed)

## 2017-02-28 DIAGNOSIS — N186 End stage renal disease: Secondary | ICD-10-CM | POA: Diagnosis not present

## 2017-02-28 DIAGNOSIS — N2581 Secondary hyperparathyroidism of renal origin: Secondary | ICD-10-CM | POA: Diagnosis not present

## 2017-03-03 DIAGNOSIS — N2581 Secondary hyperparathyroidism of renal origin: Secondary | ICD-10-CM | POA: Diagnosis not present

## 2017-03-03 DIAGNOSIS — N186 End stage renal disease: Secondary | ICD-10-CM | POA: Diagnosis not present

## 2017-03-04 ENCOUNTER — Ambulatory Visit: Payer: BLUE CROSS/BLUE SHIELD

## 2017-03-05 DIAGNOSIS — N2581 Secondary hyperparathyroidism of renal origin: Secondary | ICD-10-CM | POA: Diagnosis not present

## 2017-03-05 DIAGNOSIS — N186 End stage renal disease: Secondary | ICD-10-CM | POA: Diagnosis not present

## 2017-03-07 DIAGNOSIS — N186 End stage renal disease: Secondary | ICD-10-CM | POA: Diagnosis not present

## 2017-03-07 DIAGNOSIS — N2581 Secondary hyperparathyroidism of renal origin: Secondary | ICD-10-CM | POA: Diagnosis not present

## 2017-03-10 DIAGNOSIS — N2581 Secondary hyperparathyroidism of renal origin: Secondary | ICD-10-CM | POA: Diagnosis not present

## 2017-03-10 DIAGNOSIS — N186 End stage renal disease: Secondary | ICD-10-CM | POA: Diagnosis not present

## 2017-03-11 ENCOUNTER — Ambulatory Visit (INDEPENDENT_AMBULATORY_CARE_PROVIDER_SITE_OTHER): Payer: BLUE CROSS/BLUE SHIELD | Admitting: "Endocrinology

## 2017-03-11 VITALS — Ht 75.0 in | Wt 236.0 lb

## 2017-03-11 DIAGNOSIS — N186 End stage renal disease: Secondary | ICD-10-CM | POA: Diagnosis not present

## 2017-03-11 DIAGNOSIS — E1022 Type 1 diabetes mellitus with diabetic chronic kidney disease: Secondary | ICD-10-CM

## 2017-03-11 NOTE — Progress Notes (Signed)
Pt brings in his new Freestyle Libre today. He has the 10 day reader and sensors. Went over instructions on how to use the Chance system. Also went over instruction on how to properly apply the sensor to his arm. Libre sensor was placed on the back of pts upper left arm. He voices understanding on how to use the system and how to get BG reading for insulin injections and as needed. He was advised to contact the office if he has any questions or concerns.

## 2017-03-12 DIAGNOSIS — N186 End stage renal disease: Secondary | ICD-10-CM | POA: Diagnosis not present

## 2017-03-12 DIAGNOSIS — N2581 Secondary hyperparathyroidism of renal origin: Secondary | ICD-10-CM | POA: Diagnosis not present

## 2017-03-13 DIAGNOSIS — Z01818 Encounter for other preprocedural examination: Secondary | ICD-10-CM | POA: Diagnosis not present

## 2017-03-13 DIAGNOSIS — Z992 Dependence on renal dialysis: Secondary | ICD-10-CM | POA: Diagnosis not present

## 2017-03-13 DIAGNOSIS — I129 Hypertensive chronic kidney disease with stage 1 through stage 4 chronic kidney disease, or unspecified chronic kidney disease: Secondary | ICD-10-CM | POA: Diagnosis not present

## 2017-03-13 DIAGNOSIS — I10 Essential (primary) hypertension: Secondary | ICD-10-CM | POA: Diagnosis not present

## 2017-03-13 DIAGNOSIS — E785 Hyperlipidemia, unspecified: Secondary | ICD-10-CM | POA: Diagnosis not present

## 2017-03-13 DIAGNOSIS — I5032 Chronic diastolic (congestive) heart failure: Secondary | ICD-10-CM | POA: Diagnosis not present

## 2017-03-13 DIAGNOSIS — I5020 Unspecified systolic (congestive) heart failure: Secondary | ICD-10-CM | POA: Diagnosis not present

## 2017-03-13 DIAGNOSIS — N186 End stage renal disease: Secondary | ICD-10-CM | POA: Diagnosis not present

## 2017-03-13 DIAGNOSIS — E1022 Type 1 diabetes mellitus with diabetic chronic kidney disease: Secondary | ICD-10-CM | POA: Diagnosis not present

## 2017-03-14 DIAGNOSIS — E1022 Type 1 diabetes mellitus with diabetic chronic kidney disease: Secondary | ICD-10-CM | POA: Diagnosis not present

## 2017-03-14 DIAGNOSIS — N186 End stage renal disease: Secondary | ICD-10-CM | POA: Diagnosis not present

## 2017-03-14 DIAGNOSIS — Z992 Dependence on renal dialysis: Secondary | ICD-10-CM | POA: Diagnosis not present

## 2017-03-14 DIAGNOSIS — N2581 Secondary hyperparathyroidism of renal origin: Secondary | ICD-10-CM | POA: Diagnosis not present

## 2017-03-17 DIAGNOSIS — R197 Diarrhea, unspecified: Secondary | ICD-10-CM | POA: Diagnosis not present

## 2017-03-17 DIAGNOSIS — N2581 Secondary hyperparathyroidism of renal origin: Secondary | ICD-10-CM | POA: Diagnosis not present

## 2017-03-17 DIAGNOSIS — N186 End stage renal disease: Secondary | ICD-10-CM | POA: Diagnosis not present

## 2017-03-19 DIAGNOSIS — R197 Diarrhea, unspecified: Secondary | ICD-10-CM | POA: Diagnosis not present

## 2017-03-19 DIAGNOSIS — N2581 Secondary hyperparathyroidism of renal origin: Secondary | ICD-10-CM | POA: Diagnosis not present

## 2017-03-19 DIAGNOSIS — N186 End stage renal disease: Secondary | ICD-10-CM | POA: Diagnosis not present

## 2017-03-21 DIAGNOSIS — N2581 Secondary hyperparathyroidism of renal origin: Secondary | ICD-10-CM | POA: Diagnosis not present

## 2017-03-21 DIAGNOSIS — R197 Diarrhea, unspecified: Secondary | ICD-10-CM | POA: Diagnosis not present

## 2017-03-21 DIAGNOSIS — N186 End stage renal disease: Secondary | ICD-10-CM | POA: Diagnosis not present

## 2017-03-24 DIAGNOSIS — N186 End stage renal disease: Secondary | ICD-10-CM | POA: Diagnosis not present

## 2017-03-24 DIAGNOSIS — N2581 Secondary hyperparathyroidism of renal origin: Secondary | ICD-10-CM | POA: Diagnosis not present

## 2017-03-25 ENCOUNTER — Encounter: Payer: BLUE CROSS/BLUE SHIELD | Attending: Family Medicine | Admitting: Nutrition

## 2017-03-25 VITALS — Ht 75.0 in | Wt 244.0 lb

## 2017-03-25 DIAGNOSIS — N186 End stage renal disease: Secondary | ICD-10-CM | POA: Diagnosis not present

## 2017-03-25 DIAGNOSIS — E1021 Type 1 diabetes mellitus with diabetic nephropathy: Secondary | ICD-10-CM

## 2017-03-25 DIAGNOSIS — E1022 Type 1 diabetes mellitus with diabetic chronic kidney disease: Secondary | ICD-10-CM | POA: Insufficient documentation

## 2017-03-25 DIAGNOSIS — Z992 Dependence on renal dialysis: Secondary | ICD-10-CM

## 2017-03-25 DIAGNOSIS — IMO0002 Reserved for concepts with insufficient information to code with codable children: Secondary | ICD-10-CM

## 2017-03-25 DIAGNOSIS — Z713 Dietary counseling and surveillance: Secondary | ICD-10-CM | POA: Diagnosis not present

## 2017-03-25 DIAGNOSIS — E1065 Type 1 diabetes mellitus with hyperglycemia: Secondary | ICD-10-CM

## 2017-03-25 NOTE — Progress Notes (Signed)
Medical Nutrition Therapy:  Appt start time: 1500 end time:  1600.   Assessment:  Primary concerns today: DM Type 1. Lives with wife and son. He does shopping and cooking in home. Eat three meals per day. 30 Units lantus and 12 Humalog plus sliding TID with meals with sliding scale.  Walks twice  A week 30 minutes.  Wt is stable. Has a Elenor Legato and been using it for about 3 weeks. Downloaded reader. BS are still elevated with avg 200's. He notes she is afraid of BS dropping at times and so he will eat sweets in evening to prevent low blood sugars. He thinks his BS spikes after dialysis. However, it appears more related to excessive carb intake after dialysis rather than the dialysis. His largest food intake is in the evenings.   Current diet is excessive to meet his needs. He is on the list for a kidney transplant and needs to get his A1C down to 7% or below. DIet is higher in fat, sodium and CHO than his estimated needs contributing to elevated blood sugars.   He is willing to work on eating better and exercise to improve blood sugars. He is not following renal diet very well.  Lab Results  Component Value Date   HGBA1C 9.1 (H) 02/18/2017   CMP Latest Ref Rng & Units 02/18/2017 12/04/2015 12/03/2015  Glucose 65 - 99 mg/dL 494(H) 177(H) 246(H)  BUN 6 - 24 mg/dL 46(H) 33(H) 19  Creatinine 0.76 - 1.27 mg/dL 11.23(H) 11.01(H) 7.23(H)  Sodium 134 - 144 mmol/L 131(L) 131(L) 133(L)  Potassium 3.5 - 5.2 mmol/L 5.7(H) 4.8 4.2  Chloride 96 - 106 mmol/L 88(L) 94(L) 94(L)  CO2 20 - 29 mmol/L 24 24 26   Calcium 8.7 - 10.2 mg/dL 9.0 8.8(L) 8.8(L)  Total Protein 6.0 - 8.5 g/dL 8.3 7.6 -  Total Bilirubin 0.0 - 1.2 mg/dL 0.5 0.9 -  Alkaline Phos 39 - 117 IU/L 161(H) 85 -  AST 0 - 40 IU/L 15 16 -  ALT 0 - 44 IU/L 24 12(L) -   .  Preferred Learning Style:  No preference indicated   Learning Readiness:  Ready  Change in progress   MEDICATIONS:   DIETARY INTAKE: 24-hr recall:  B ( AM):  Sausage egg and cheese mcgriiddles 2, water  Snk ( AM):   L ( PM):  Pizza 4 slices and Diet soda Snk ( PM):  D ( PM): Fried chicken, mac/cheese, cabbage, Diet soda Snk ( PM): Ice cream sandwich Beverages:  Water, diet soda, half and half lemonade and tea  Usual physical activity: ADL  Estimated energy needs: 2000  calories 225 g carbohydrates 150 g protein 56 g fat  Progress Towards Goal(s):  In progress.   Nutritional Diagnosis:  NB-1.1 Food and nutrition-related knowledge deficit As related to Diabetes Type 1.  As evidenced by A1C 9.1%.    Intervention:  Nutrition and Diabetes education provided on My Plate, CHO counting, meal planning, portion sizes, timing of meals, avoiding snacks between meals unless having a low blood sugar, target ranges for A1C and blood sugars, signs/symptoms and treatment of hyper/hypoglycemia, monitoring blood sugars, taking medications as prescribed, benefits of exercising 30 minutes per day and prevention of complications of DM. Renal Diet restrictions.    Goals  1. Eat 45-60 grams of carbs per meal at times discussed. 2. Cut out fast foods and high fat high calorie fast foods 3. Cut out sweets.at night 4. Increase walking Increase lower carb vegetables. Keep  drinking water within your allowance. Get  A1C down to 7%. Follow Renal Diet restrictions.  Teaching Method Utilized:  Visual Auditory Hands on  Handouts given during visit include:  The Plate Method  Meal Plan Card  Diabetes Instructions.   Barriers to learning/adherence to lifestyle change: none  Demonstrated degree of understanding via:  Teach Back   Monitoring/Evaluation:  Dietary intake, exercise, meal planning, SBG, and body weight in 1 month(s).

## 2017-03-25 NOTE — Patient Instructions (Addendum)
Goals  1. Eat 45-60 grams of carbs per meal at times discussed. 2. Cut out fast foods and high fat high calorie fast foods 3. Cut out sweets.at night 4. Increase walking Increase lower carb vegetables. Keep drinking water within your allowance. Get  A1C down to 7%. Follow Renal Diet restrictions.

## 2017-03-26 DIAGNOSIS — N2581 Secondary hyperparathyroidism of renal origin: Secondary | ICD-10-CM | POA: Diagnosis not present

## 2017-03-26 DIAGNOSIS — N186 End stage renal disease: Secondary | ICD-10-CM | POA: Diagnosis not present

## 2017-03-28 DIAGNOSIS — N2581 Secondary hyperparathyroidism of renal origin: Secondary | ICD-10-CM | POA: Diagnosis not present

## 2017-03-28 DIAGNOSIS — N186 End stage renal disease: Secondary | ICD-10-CM | POA: Diagnosis not present

## 2017-03-31 DIAGNOSIS — N2581 Secondary hyperparathyroidism of renal origin: Secondary | ICD-10-CM | POA: Diagnosis not present

## 2017-03-31 DIAGNOSIS — N186 End stage renal disease: Secondary | ICD-10-CM | POA: Diagnosis not present

## 2017-04-01 ENCOUNTER — Encounter: Payer: Self-pay | Admitting: Nutrition

## 2017-04-01 DIAGNOSIS — Z01810 Encounter for preprocedural cardiovascular examination: Secondary | ICD-10-CM | POA: Diagnosis not present

## 2017-04-01 DIAGNOSIS — E1122 Type 2 diabetes mellitus with diabetic chronic kidney disease: Secondary | ICD-10-CM | POA: Diagnosis not present

## 2017-04-01 DIAGNOSIS — I132 Hypertensive heart and chronic kidney disease with heart failure and with stage 5 chronic kidney disease, or end stage renal disease: Secondary | ICD-10-CM | POA: Diagnosis not present

## 2017-04-01 DIAGNOSIS — N186 End stage renal disease: Secondary | ICD-10-CM | POA: Diagnosis not present

## 2017-04-01 DIAGNOSIS — Z7682 Awaiting organ transplant status: Secondary | ICD-10-CM | POA: Diagnosis not present

## 2017-04-01 DIAGNOSIS — Z794 Long term (current) use of insulin: Secondary | ICD-10-CM | POA: Diagnosis not present

## 2017-04-01 DIAGNOSIS — I5022 Chronic systolic (congestive) heart failure: Secondary | ICD-10-CM | POA: Diagnosis not present

## 2017-04-01 DIAGNOSIS — I2510 Atherosclerotic heart disease of native coronary artery without angina pectoris: Secondary | ICD-10-CM | POA: Diagnosis not present

## 2017-04-02 DIAGNOSIS — N2581 Secondary hyperparathyroidism of renal origin: Secondary | ICD-10-CM | POA: Diagnosis not present

## 2017-04-02 DIAGNOSIS — N186 End stage renal disease: Secondary | ICD-10-CM | POA: Diagnosis not present

## 2017-04-04 DIAGNOSIS — N186 End stage renal disease: Secondary | ICD-10-CM | POA: Diagnosis not present

## 2017-04-04 DIAGNOSIS — N2581 Secondary hyperparathyroidism of renal origin: Secondary | ICD-10-CM | POA: Diagnosis not present

## 2017-04-07 DIAGNOSIS — Z992 Dependence on renal dialysis: Secondary | ICD-10-CM | POA: Diagnosis not present

## 2017-04-07 DIAGNOSIS — T82858A Stenosis of vascular prosthetic devices, implants and grafts, initial encounter: Secondary | ICD-10-CM | POA: Diagnosis not present

## 2017-04-07 DIAGNOSIS — N2581 Secondary hyperparathyroidism of renal origin: Secondary | ICD-10-CM | POA: Diagnosis not present

## 2017-04-07 DIAGNOSIS — I871 Compression of vein: Secondary | ICD-10-CM | POA: Diagnosis not present

## 2017-04-07 DIAGNOSIS — N186 End stage renal disease: Secondary | ICD-10-CM | POA: Diagnosis not present

## 2017-04-09 DIAGNOSIS — N186 End stage renal disease: Secondary | ICD-10-CM | POA: Diagnosis not present

## 2017-04-09 DIAGNOSIS — N2581 Secondary hyperparathyroidism of renal origin: Secondary | ICD-10-CM | POA: Diagnosis not present

## 2017-04-11 DIAGNOSIS — N2581 Secondary hyperparathyroidism of renal origin: Secondary | ICD-10-CM | POA: Diagnosis not present

## 2017-04-11 DIAGNOSIS — N186 End stage renal disease: Secondary | ICD-10-CM | POA: Diagnosis not present

## 2017-04-11 DIAGNOSIS — R197 Diarrhea, unspecified: Secondary | ICD-10-CM | POA: Diagnosis not present

## 2017-04-14 DIAGNOSIS — N2581 Secondary hyperparathyroidism of renal origin: Secondary | ICD-10-CM | POA: Diagnosis not present

## 2017-04-14 DIAGNOSIS — N186 End stage renal disease: Secondary | ICD-10-CM | POA: Diagnosis not present

## 2017-04-16 DIAGNOSIS — N2581 Secondary hyperparathyroidism of renal origin: Secondary | ICD-10-CM | POA: Diagnosis not present

## 2017-04-16 DIAGNOSIS — N186 End stage renal disease: Secondary | ICD-10-CM | POA: Diagnosis not present

## 2017-04-18 DIAGNOSIS — N186 End stage renal disease: Secondary | ICD-10-CM | POA: Diagnosis not present

## 2017-04-18 DIAGNOSIS — N2581 Secondary hyperparathyroidism of renal origin: Secondary | ICD-10-CM | POA: Diagnosis not present

## 2017-04-21 DIAGNOSIS — N2581 Secondary hyperparathyroidism of renal origin: Secondary | ICD-10-CM | POA: Diagnosis not present

## 2017-04-21 DIAGNOSIS — N186 End stage renal disease: Secondary | ICD-10-CM | POA: Diagnosis not present

## 2017-04-23 DIAGNOSIS — N2581 Secondary hyperparathyroidism of renal origin: Secondary | ICD-10-CM | POA: Diagnosis not present

## 2017-04-23 DIAGNOSIS — N186 End stage renal disease: Secondary | ICD-10-CM | POA: Diagnosis not present

## 2017-04-25 DIAGNOSIS — N186 End stage renal disease: Secondary | ICD-10-CM | POA: Diagnosis not present

## 2017-04-25 DIAGNOSIS — N2581 Secondary hyperparathyroidism of renal origin: Secondary | ICD-10-CM | POA: Diagnosis not present

## 2017-04-28 DIAGNOSIS — N2581 Secondary hyperparathyroidism of renal origin: Secondary | ICD-10-CM | POA: Diagnosis not present

## 2017-04-28 DIAGNOSIS — N186 End stage renal disease: Secondary | ICD-10-CM | POA: Diagnosis not present

## 2017-04-30 DIAGNOSIS — N186 End stage renal disease: Secondary | ICD-10-CM | POA: Diagnosis not present

## 2017-04-30 DIAGNOSIS — N2581 Secondary hyperparathyroidism of renal origin: Secondary | ICD-10-CM | POA: Diagnosis not present

## 2017-05-02 DIAGNOSIS — N186 End stage renal disease: Secondary | ICD-10-CM | POA: Diagnosis not present

## 2017-05-02 DIAGNOSIS — N2581 Secondary hyperparathyroidism of renal origin: Secondary | ICD-10-CM | POA: Diagnosis not present

## 2017-05-05 DIAGNOSIS — N2581 Secondary hyperparathyroidism of renal origin: Secondary | ICD-10-CM | POA: Diagnosis not present

## 2017-05-05 DIAGNOSIS — R51 Headache: Secondary | ICD-10-CM | POA: Diagnosis not present

## 2017-05-05 DIAGNOSIS — N186 End stage renal disease: Secondary | ICD-10-CM | POA: Diagnosis not present

## 2017-05-07 DIAGNOSIS — N186 End stage renal disease: Secondary | ICD-10-CM | POA: Diagnosis not present

## 2017-05-07 DIAGNOSIS — R51 Headache: Secondary | ICD-10-CM | POA: Diagnosis not present

## 2017-05-07 DIAGNOSIS — N2581 Secondary hyperparathyroidism of renal origin: Secondary | ICD-10-CM | POA: Diagnosis not present

## 2017-05-08 DIAGNOSIS — Z992 Dependence on renal dialysis: Secondary | ICD-10-CM | POA: Diagnosis not present

## 2017-05-08 DIAGNOSIS — N186 End stage renal disease: Secondary | ICD-10-CM | POA: Diagnosis not present

## 2017-05-08 DIAGNOSIS — I871 Compression of vein: Secondary | ICD-10-CM | POA: Diagnosis not present

## 2017-05-09 DIAGNOSIS — N186 End stage renal disease: Secondary | ICD-10-CM | POA: Diagnosis not present

## 2017-05-09 DIAGNOSIS — R51 Headache: Secondary | ICD-10-CM | POA: Diagnosis not present

## 2017-05-09 DIAGNOSIS — N2581 Secondary hyperparathyroidism of renal origin: Secondary | ICD-10-CM | POA: Diagnosis not present

## 2017-05-11 DIAGNOSIS — E1022 Type 1 diabetes mellitus with diabetic chronic kidney disease: Secondary | ICD-10-CM | POA: Diagnosis not present

## 2017-05-11 DIAGNOSIS — N186 End stage renal disease: Secondary | ICD-10-CM | POA: Diagnosis not present

## 2017-05-11 DIAGNOSIS — Z992 Dependence on renal dialysis: Secondary | ICD-10-CM | POA: Diagnosis not present

## 2017-05-12 DIAGNOSIS — N2581 Secondary hyperparathyroidism of renal origin: Secondary | ICD-10-CM | POA: Diagnosis not present

## 2017-05-12 DIAGNOSIS — N186 End stage renal disease: Secondary | ICD-10-CM | POA: Diagnosis not present

## 2017-05-13 ENCOUNTER — Ambulatory Visit (HOSPITAL_COMMUNITY)
Admission: RE | Admit: 2017-05-13 | Discharge: 2017-05-13 | Disposition: A | Discharge status: Home / Self Care | Payer: BLUE CROSS/BLUE SHIELD | Source: Ambulatory Visit | Attending: Family Medicine | Admitting: Family Medicine

## 2017-05-13 ENCOUNTER — Encounter: Payer: Self-pay | Admitting: Family Medicine

## 2017-05-13 ENCOUNTER — Ambulatory Visit (INDEPENDENT_AMBULATORY_CARE_PROVIDER_SITE_OTHER): Payer: BLUE CROSS/BLUE SHIELD | Admitting: Family Medicine

## 2017-05-13 VITALS — BP 120/100 | Temp 99.1°F | Ht 75.0 in | Wt 242.2 lb

## 2017-05-13 DIAGNOSIS — S99921A Unspecified injury of right foot, initial encounter: Secondary | ICD-10-CM | POA: Diagnosis not present

## 2017-05-13 DIAGNOSIS — I739 Peripheral vascular disease, unspecified: Secondary | ICD-10-CM | POA: Diagnosis not present

## 2017-05-13 DIAGNOSIS — S9031XA Contusion of right foot, initial encounter: Secondary | ICD-10-CM

## 2017-05-13 DIAGNOSIS — E1159 Type 2 diabetes mellitus with other circulatory complications: Secondary | ICD-10-CM | POA: Insufficient documentation

## 2017-05-13 MED ORDER — CEPHALEXIN 500 MG PO CAPS: 500.0000 mg | ORAL_CAPSULE | Freq: Four times a day (QID) | ORAL | 0 refills | Status: DC

## 2017-05-13 MED ORDER — CEPHALEXIN 500 MG PO CAPS: 500.0000 mg | ORAL_CAPSULE | Freq: Four times a day (QID) | ORAL | 0 refills | Status: AC

## 2017-05-13 NOTE — Progress Notes (Signed)
   Subjective:    Patient ID: Lance [name], male    DOB: 12-27-1970, 47 y.o.   MRN: [id number]  HPI Patient is here today with complaints of a sore on the right foot great and the second toe. Fell on Thursday and noticed the toes on Sunday. They are not painful. He is diabetic and wanted to get checked out. This patient states that he fell he hit his toes in 1 of the toes became black it is not giving him any pain or discomfort it is not draining any.  He does have a history history of peripheral vascular disease and dialysis denies fever chills sweats wheezing difficulty breathing  Review of Systems Tenderness in the toe was noted no ankle pain pain no knee pain negative for cough vomiting fevers    Objective:   Physical Exam Respiratory rate normal lungs clear heart regular pulse normal Foot exam reveals dry cracked skin also reveals a black toe that is firm nontender Moderate neuropathy      Significant peripheral vascular disease Assessment & Plan:  Patient has what appears to be a necrotic toe I believe this patient needs to be seen by general surgery we will help set him up in [place].  Antibiotic prescribed for possible cellulitis  X-ray ordered await results

## 2017-05-14 ENCOUNTER — Encounter: Payer: Self-pay | Admitting: Family Medicine

## 2017-05-14 ENCOUNTER — Telehealth: Payer: Self-pay | Admitting: Family Medicine

## 2017-05-14 DIAGNOSIS — N2581 Secondary hyperparathyroidism of renal origin: Secondary | ICD-10-CM | POA: Diagnosis not present

## 2017-05-14 DIAGNOSIS — N186 End stage renal disease: Secondary | ICD-10-CM | POA: Diagnosis not present

## 2017-05-14 NOTE — Telephone Encounter (Signed)
Dr. Sharol Given to call Dr. Nicki Reaper to discuss this patient's case  Pt is scheduled for 05/15/17 with Dr. Sharol Given & pt is aware

## 2017-05-14 NOTE — Telephone Encounter (Signed)
I did discuss this with orthopedics.  Next Thursday appointment would be fine please notify the patient make sure he is aware of the appointment if he feels he is having any sign of infection the follow-up sooner

## 2017-05-15 ENCOUNTER — Ambulatory Visit (INDEPENDENT_AMBULATORY_CARE_PROVIDER_SITE_OTHER): Payer: BLUE CROSS/BLUE SHIELD | Admitting: Orthopedic Surgery

## 2017-05-15 ENCOUNTER — Encounter (INDEPENDENT_AMBULATORY_CARE_PROVIDER_SITE_OTHER): Payer: Self-pay | Admitting: Orthopedic Surgery

## 2017-05-15 VITALS — Ht 75.0 in | Wt 242.0 lb

## 2017-05-15 DIAGNOSIS — I96 Gangrene, not elsewhere classified: Secondary | ICD-10-CM

## 2017-05-15 DIAGNOSIS — E1142 Type 2 diabetes mellitus with diabetic polyneuropathy: Secondary | ICD-10-CM | POA: Diagnosis not present

## 2017-05-15 NOTE — Addendum Note (Signed)
Addended by: Pamella Pert on: 05/15/2017 11:06 AM   Modules accepted: Orders

## 2017-05-15 NOTE — Telephone Encounter (Signed)
Pt will see Dr. Sharol Given today, their office notified pt

## 2017-05-15 NOTE — Progress Notes (Signed)
Office Visit Note   Patient: Lance [name]           Date of Birth: 09/25/1970           MRN: [id number] Visit Date: 05/15/2017              Requested by: [name], [address], [place] [address] PCP: [name], MD  Chief Complaint  Patient presents with  . Right Foot - Injury    [place] 05/08/17 - small lacerations right great toe, dark-colored 2nd toe      HPI: Patient is a 47 year old gentleman is seen for initial evaluation for black gangrenous changes right foot second toe.  Patient denies any pain.  Patient's most recent hemoglobin A1c is 9.1.  Patient has seen vascular vein surgery in the past for dialysis access.  Assessment & Plan: Visit Diagnoses:  1. Gangrene of toe of right foot ([place])   2. Diabetic polyneuropathy associated with type 2 diabetes mellitus ([place])     Plan: We will follow-up in 2 weeks.  Discussed that if he has any acute changes we will follow-up immediately.  We will request consultation with vascular vein surgery for evaluation of the circulation to the right lower extremity.  Follow-Up Instructions: Return in about 2 weeks (around 05/29/2017).   Ortho Exam  Patient is alert, oriented, no adenopathy, well-dressed, normal affect, normal respiratory effort. Examination patient has an antalgic gait he does not have a palpable dorsalis pedis or posterior tibial pulse.  There is dry gangrenous changes to the tip of the second toe right foot there is no cellulitis no drainage no signs of infection.  There is no pain to palpation.  Imaging: No results found. No images are attached to the encounter.  Labs: Lab Results  Component Value Date   HGBA1C 9.1 (H) 02/18/2017   HGBA1C 9.8 (H) 11/28/2015   HGBA1C 9.7 (H) 12/26/2013   ESRSEDRATE 120 (H) 11/13/2015   CRP 35.4 (H) 11/13/2015   REPTSTATUS 12/06/2015 FINAL 11/30/2015   REPTSTATUS 11/30/2015 FINAL 11/30/2015   GRAMSTAIN  11/30/2015    WBC PRESENT,BOTH  PMN AND MONONUCLEAR NO ORGANISMS SEEN CYTOSPIN SMEAR    CULT NO GROWTH 5 DAYS 11/30/2015   LABORGA STREPTOCOCCUS PNEUMONIAE 11/12/2015    @LABSALLVALUES (HGBA1)@  Body mass index is 30.25 kg/m.  Orders:  No orders of the defined types were placed in this encounter.  No orders of the defined types were placed in this encounter.    Procedures: No procedures performed  Clinical Data: No additional findings.  ROS:  All other systems negative, except as noted in the HPI. Review of Systems  Objective: Vital Signs: Ht 6\' 3"  (1.905 m)   Wt 242 lb (109.8 kg)   BMI 30.25 kg/m   Specialty Comments:  No specialty comments available.  PMFS History: Patient Active Problem List   Diagnosis Date Noted  . PVD (peripheral vascular disease) ([place]) 05/13/2017  . Bilateral pleural effusion 11/28/2015  . Loculated pleural effusion 11/28/2015  . HCAP (healthcare-associated pneumonia) 11/28/2015  . ESRD (end stage renal disease) on dialysis ([place]) 11/12/2015  . Hyperkalemia 11/12/2015  . Shoulder pain, acute 11/12/2015  . Nausea & vomiting 11/12/2015  . Diarrhea 11/12/2015  . Hypertensive urgency 05/05/2014  . Nephrotic syndrome 12/29/2013  . Hyponatremia 12/28/2013  . Acute diastolic heart failure ([place]) 12/28/2013  . Hypoglycemia 12/28/2013  . Dyspnea   . Acute renal failure syndrome ([place])   . Diabetic ketoacidosis without  coma associated with type 1 diabetes mellitus ([place])   . Demand ischemia ([place]) 12/24/2013  . DKA (diabetic ketoacidoses) ([place]) 12/23/2013  . CAP (community acquired pneumonia) 12/23/2013  . Sepsis due to pneumonia ([place]) 12/23/2013  . Metabolic acidemia 57/02/7791  . Acute renal failure ([place]) 12/23/2013  . Diabetic ketoacidosis ([place]) 12/23/2013  . Chronic diastolic heart failure ([place]) 04/14/2013  . PSVT (paroxysmal supraventricular tachycardia) ([place]) 04/14/2013  . Bilateral leg edema 03/26/2013  . Chronic kidney disease, stage III (moderate) ([place])  09/29/2011  . 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 Essential hypertension, benign 09/26/2010  . Hyperlipidemia 09/26/2010   Past Medical History:  Diagnosis Date  . Aortic stenosis    Very mild in 05/2007  . Arthritis    "right shoulder; right knee; feel like I've got it all over" (11/28/2015)  . Degenerative joint disease    Left TKA  . Diastolic heart failure (HCC)    LVEF 65-70% with grade 2 diastolic dysfunction  . ESRD (end stage renal disease) on dialysis ([place])    "MWF; [facility] [address]" (11/28/2015)  . Essential hypertension, benign 2000   LVH  . GERD (gastroesophageal reflux disease)   . HCAP (healthcare-associated pneumonia) 11/28/2015  . Hyperlipidemia 2000  . Loculated pleural effusion 11/28/2015   [name] 11/28/2015  . Pneumonia 12/2013; 05/2014  . PSVT (paroxysmal supraventricular tachycardia) (HCC)    Nonsustained; asymptomatic; diagnosed by event recorder in 2006  . Tobacco abuse, in remission    20 pack years; discontinued in 1985; bronchitic changes on chest x-ray and 2009  . Type 1 diabetes mellitus ([place]) 1990    Family History  Problem Relation Age of Onset  . Diabetes Father   . Hypertension Father   . Diabetes Mother   . Hypertension Mother     Past Surgical History:  Procedure Laterality Date  . AV FISTULA PLACEMENT Right 06/23/2014   Procedure: ARTERIOVENOUS (AV) FISTULA CREATION;  Surgeon: Angelia Mould, MD;  Location: [facility];  Service: Vascular;  Laterality: Right;  . EYE SURGERY Bilateral    "for glaucoma"  . INGUINAL HERNIA REPAIR Right    As a child  . [place] EXTRACTION  1987   Social History   Occupational History  . Not on file  Tobacco Use  . Smoking status: Never Smoker  . Smokeless tobacco: Never Used  Substance and Sexual Activity  . Alcohol use: Yes    Comment: 11/28/2015 "used to drink; stopped ~ 2 yr ago"  . Drug use: No  . Sexual activity: Yes

## 2017-05-16 DIAGNOSIS — N186 End stage renal disease: Secondary | ICD-10-CM | POA: Diagnosis not present

## 2017-05-16 DIAGNOSIS — N2581 Secondary hyperparathyroidism of renal origin: Secondary | ICD-10-CM | POA: Diagnosis not present

## 2017-05-19 DIAGNOSIS — N186 End stage renal disease: Secondary | ICD-10-CM | POA: Diagnosis not present

## 2017-05-19 DIAGNOSIS — N2581 Secondary hyperparathyroidism of renal origin: Secondary | ICD-10-CM | POA: Diagnosis not present

## 2017-05-21 DIAGNOSIS — N186 End stage renal disease: Secondary | ICD-10-CM | POA: Diagnosis not present

## 2017-05-21 DIAGNOSIS — N2581 Secondary hyperparathyroidism of renal origin: Secondary | ICD-10-CM | POA: Diagnosis not present

## 2017-05-22 ENCOUNTER — Encounter: Payer: Self-pay | Admitting: *Deleted

## 2017-05-22 ENCOUNTER — Ambulatory Visit (INDEPENDENT_AMBULATORY_CARE_PROVIDER_SITE_OTHER): Payer: BLUE CROSS/BLUE SHIELD | Admitting: Vascular Surgery

## 2017-05-22 ENCOUNTER — Ambulatory Visit (INDEPENDENT_AMBULATORY_CARE_PROVIDER_SITE_OTHER): Payer: BLUE CROSS/BLUE SHIELD | Admitting: Orthopedic Surgery

## 2017-05-22 ENCOUNTER — Ambulatory Visit (HOSPITAL_COMMUNITY)
Admission: RE | Admit: 2017-05-22 | Discharge: 2017-05-22 | Disposition: A | Discharge status: Home / Self Care | Payer: BLUE CROSS/BLUE SHIELD | Source: Ambulatory Visit | Attending: Vascular Surgery | Admitting: Vascular Surgery

## 2017-05-22 ENCOUNTER — Other Ambulatory Visit: Payer: Self-pay | Admitting: *Deleted

## 2017-05-22 ENCOUNTER — Encounter: Payer: Self-pay | Admitting: Vascular Surgery

## 2017-05-22 ENCOUNTER — Other Ambulatory Visit: Payer: Self-pay

## 2017-05-22 ENCOUNTER — Other Ambulatory Visit: Payer: Self-pay | Admitting: Vascular Surgery

## 2017-05-22 VITALS — BP 137/82 | HR 97 | Temp 99.0°F | Resp 18 | Ht 75.0 in | Wt 238.0 lb

## 2017-05-22 DIAGNOSIS — I96 Gangrene, not elsewhere classified: Secondary | ICD-10-CM | POA: Diagnosis not present

## 2017-05-22 DIAGNOSIS — I70243 Atherosclerosis of native arteries of left leg with ulceration of ankle: Secondary | ICD-10-CM | POA: Diagnosis not present

## 2017-05-22 DIAGNOSIS — R9439 Abnormal result of other cardiovascular function study: Secondary | ICD-10-CM | POA: Insufficient documentation

## 2017-05-22 NOTE — H&P (View-Only) (Signed)
[facility] of [place]  Patient name: Lance [name] MRN: [id number] DOB: 11/26/1970 Sex: male  REASON FOR CONSULT: Gangrenous changes right second and third toes  HPI: Lance [name] is a 47 y.o. male, who is here today for evaluation of arterial flow to his right foot.  He is known to our practice from placement of a right upper arm AV fistula by Dr. Scot Dock several years ago.  He remains quite active.  He has done very well with hemodialysis.  Reports that he has had several episodes of outpatient intervention with his fistula.  He denies any claudication type symptoms.  He reports that he tripped and fell and scuffed his right knee and toes 2 weeks ago today.  3 days later he noted that he had darkening of his toes.  He denies any fevers and is not had any erythema around this.  He has had progression to dry gangrene of his second toe.  He saw Dr. Sharol Given several days ago and has been referred to [place] for evaluation of his arterial circulation.  He had no prior history of lower extremity tissue loss.  Does not have any arterial rest pain.  Past Medical History:  Diagnosis Date  . Aortic stenosis    Very mild in 05/2007  . Arthritis    "right shoulder; right knee; feel like I've got it all over" (11/28/2015)  . Degenerative joint disease    Left TKA  . Diastolic heart failure (HCC)    LVEF 65-70% with grade 2 diastolic dysfunction  . ESRD (end stage renal disease) on dialysis ([place])    "MWF; [facility] [address]" (11/28/2015)  . Essential hypertension, benign 2000   LVH  . GERD (gastroesophageal reflux disease)   . HCAP (healthcare-associated pneumonia) 11/28/2015  . Hyperlipidemia 2000  . Loculated pleural effusion 11/28/2015   [name] 11/28/2015  . Pneumonia 12/2013; 05/2014  . PSVT (paroxysmal supraventricular tachycardia) (HCC)    Nonsustained; asymptomatic; diagnosed by event recorder in 2006  . Tobacco abuse, in  remission    20 pack years; discontinued in 1985; bronchitic changes on chest x-ray and 2009  . Type 1 diabetes mellitus ([place]) 1990    Family History  Problem Relation Age of Onset  . Diabetes Father   . Hypertension Father   . Diabetes Mother   . Hypertension Mother     SOCIAL HISTORY: Social History   Socioeconomic History  . Marital status: Married    Spouse name: Not on file  . Number of children: Not on file  . Years of education: Not on file  . Highest education level: Not on file  Occupational History  . Not on file  Social Needs  . Financial resource strain: Not on file  . Food insecurity:    Worry: Not on file    Inability: Not on file  . Transportation needs:    Medical: Not on file    Non-medical: Not on file  Tobacco Use  . Smoking status: Never Smoker  . Smokeless tobacco: Never Used  Substance and Sexual Activity  . Alcohol use: Yes    Comment: 11/28/2015 "used to drink; stopped ~ 2 yr ago"  . Drug use: No  . Sexual activity: Yes  Lifestyle  . Physical activity:    Days per week: Not on file    Minutes per session: Not on file  . Stress: Not on file  Relationships  . Social connections:    Talks  on phone: Not on file    Gets together: Not on file    Attends religious service: Not on file    Active member of club or organization: Not on file    Attends meetings of clubs or organizations: Not on file    Relationship status: Not on file  . Intimate partner violence:    Fear of current or ex partner: Not on file    Emotionally abused: Not on file    Physically abused: Not on file    Forced sexual activity: Not on file  Other Topics Concern  . Not on file  Social History Narrative  . Not on file    Allergies  Allergen Reactions  . Atorvastatin Other (See Comments)    Myalgias Myalgias  . Minoxidil Other (See Comments)    Fluid retention Fluid retention    Current Outpatient Medications  Medication Sig Dispense Refill  . carvedilol  (COREG CR) 10 MG 24 hr capsule Take 10 mg by mouth daily.    . cephALEXin (KEFLEX) 500 MG capsule Take 1 capsule (500 mg total) by mouth 4 (four) times daily. 40 capsule 0  . Continuous Blood Gluc Sensor (FREESTYLE LIBRE SENSOR SYSTEM) MISC Use one sensor every 10 days. 3 each 2  . ferric citrate (AURYXIA) 1 GM 210 MG(Fe) tablet Take by mouth.    Marland Kitchen HUMALOG KWIKPEN 100 UNIT/ML KiwkPen Inject 12-18 Units into the skin 3 (three) times daily before meals.  0  . LANTUS 100 UNIT/ML injection Inject 30 Units into the skin at bedtime.  10  . lisinopril (PRINIVIL,ZESTRIL) 20 MG tablet Take by mouth.    Marland Kitchen amLODipine (NORVASC) 10 MG tablet TAKE 1 TABLET BY MOUTH EVERY DAY (Patient not taking: Reported on 05/22/2017) 15 tablet 0   No current facility-administered medications for this visit.     REVIEW OF SYSTEMS:  [X]  denotes positive finding, [ ]  denotes negative finding Cardiac  Comments:  Chest pain or chest pressure:    Shortness of breath upon exertion:    Short of breath when lying flat:    Irregular heart rhythm:        Vascular    Pain in calf, thigh, or hip brought on by ambulation:    Pain in feet at night that wakes you up from your sleep:     Blood clot in your veins:    Leg swelling:         Pulmonary    Oxygen at home:    Productive cough:     Wheezing:         Neurologic    Sudden weakness in arms or legs:     Sudden numbness in arms or legs:     Sudden onset of difficulty speaking or slurred speech:    Temporary loss of vision in one eye:     Problems with dizziness:         Gastrointestinal    Blood in stool:     Vomited blood:         Genitourinary    Burning when urinating:     Blood in urine:        Psychiatric    Major depression:         Hematologic    Bleeding problems:    Problems with blood clotting too easily:        Skin    Rashes or ulcers:        Constitutional    Fever or  chills:      PHYSICAL EXAM: Vitals:   05/22/17 0920 05/22/17 0923    BP: (!) 147/81 137/82  Pulse: 97 97  Resp: 18   Temp: 99 F (37.2 C)   TempSrc: Oral   SpO2: 97%   Weight: 238 lb (108 kg)   Height: 6\' 3"  (1.905 m)     GENERAL: The patient is a well-nourished male, in no acute distress. The vital signs are documented above. CARDIOVASCULAR: 2+ radial pulses bilaterally.  2+ left dorsalis pedis pulse.  He has 2+ femoral pulses bilaterally.  I did not palpate popliteal or distal pulses on the right.  He does have audible Doppler flow at the dorsalis pedis and posterior tibial on the right PULMONARY: There is good air exchange  ABDOMEN: Soft and non-tender  MUSCULOSKELETAL: There are no major deformities or cyanosis. NEUROLOGIC: No focal weakness or paresthesias are detected. SKIN: Has a ulceration which appears to be healing on the plantar aspect of his right great toe.  Has dry gangrene of half of his second toe and cyanosis of his third toe.  The fourth and fifth toe are normal PSYCHIATRIC: The patient has a normal affect.  DATA:  Noninvasive ankle arm index in our office today.  He has monophasic waveforms at the right foot and triphasic at the left foot.  His vessels are noncompressible therefore ankle arm index is not reliable.  He has markedly diminished toe brachial index on the right and normal on the left  MEDICAL ISSUES: Had long discussion with patient regarding this.  He appears to have right lower extremity arterial insufficiency related to SFA or popliteal disease.  I am concerned regarding any healing for toe amputation is required on the right.  Have recommended that he undergo arteriography for further evaluation.  He has dialysis on Monday Wednesday and Friday and therefore schedule this at his next nondialysis day on Tuesday, April 16.  He understands that if he has options for endovascular treatment, this will be done at the same setting.  He has scheduled follow-up with Dr. Sharol Given in 2 weeks regarding his toe gangrene.   Rosetta Posner,  MD FACS [facility] Tel [phone] Pager [phone]

## 2017-05-22 NOTE — Progress Notes (Signed)
Vitals:   05/22/17 0920  BP: (!) 147/81  Pulse: 97  Resp: 18  Temp: 99 F (37.2 C)  TempSrc: Oral  SpO2: 97%  Weight: 238 lb (108 kg)  Height: 6\' 3"  (1.905 m)

## 2017-05-22 NOTE — Progress Notes (Signed)
Vascular and Vein Specialist of [place]  Patient name: Lance [name] MRN: [id number] DOB: 1970-10-07 Sex: male  REASON FOR CONSULT: Gangrenous changes right second and third toes  HPI: Lance [name] is a 47 y.o. male, who is here today for evaluation of arterial flow to his right foot.  He is known to our practice from placement of a right upper arm AV fistula by Dr. Scot Dock several years ago.  He remains quite active.  He has done very well with hemodialysis.  Reports that he has had several episodes of outpatient intervention with his fistula.  He denies any claudication type symptoms.  He reports that he tripped and fell and scuffed his right knee and toes 2 weeks ago today.  3 days later he noted that he had darkening of his toes.  He denies any fevers and is not had any erythema around this.  He has had progression to dry gangrene of his second toe.  He saw Dr. Sharol Given several days ago and has been referred to [place] for evaluation of his arterial circulation.  He had no prior history of lower extremity tissue loss.  Does not have any arterial rest pain.  Past Medical History:  Diagnosis Date  . Aortic stenosis    Very mild in 05/2007  . Arthritis    "right shoulder; right knee; feel like I've got it all over" (11/28/2015)  . Degenerative joint disease    Left TKA  . Diastolic heart failure (HCC)    LVEF 65-70% with grade 2 diastolic dysfunction  . ESRD (end stage renal disease) on dialysis ([place])    "MWF; [facility] [address]" (11/28/2015)  . Essential hypertension, benign 2000   LVH  . GERD (gastroesophageal reflux disease)   . HCAP (healthcare-associated pneumonia) 11/28/2015  . Hyperlipidemia 2000  . Loculated pleural effusion 11/28/2015   [name] 11/28/2015  . Pneumonia 12/2013; 05/2014  . PSVT (paroxysmal supraventricular tachycardia) (HCC)    Nonsustained; asymptomatic; diagnosed by event recorder in 2006  . Tobacco abuse, in  remission    20 pack years; discontinued in 1985; bronchitic changes on chest x-ray and 2009  . Type 1 diabetes mellitus ([place]) 1990    Family History  Problem Relation Age of Onset  . Diabetes Father   . Hypertension Father   . Diabetes Mother   . Hypertension Mother     SOCIAL HISTORY: Social History   Socioeconomic History  . Marital status: Married    Spouse name: Not on file  . Number of children: Not on file  . Years of education: Not on file  . Highest education level: Not on file  Occupational History  . Not on file  Social Needs  . Financial resource strain: Not on file  . Food insecurity:    Worry: Not on file    Inability: Not on file  . Transportation needs:    Medical: Not on file    Non-medical: Not on file  Tobacco Use  . Smoking status: Never Smoker  . Smokeless tobacco: Never Used  Substance and Sexual Activity  . Alcohol use: Yes    Comment: 11/28/2015 "used to drink; stopped ~ 2 yr ago"  . Drug use: No  . Sexual activity: Yes  Lifestyle  . Physical activity:    Days per week: Not on file    Minutes per session: Not on file  . Stress: Not on file  Relationships  . Social connections:    Talks  on phone: Not on file    Gets together: Not on file    Attends religious service: Not on file    Active member of club or organization: Not on file    Attends meetings of clubs or organizations: Not on file    Relationship status: Not on file  . Intimate partner violence:    Fear of current or ex partner: Not on file    Emotionally abused: Not on file    Physically abused: Not on file    Forced sexual activity: Not on file  Other Topics Concern  . Not on file  Social History Narrative  . Not on file    Allergies  Allergen Reactions  . Atorvastatin Other (See Comments)    Myalgias Myalgias  . Minoxidil Other (See Comments)    Fluid retention Fluid retention    Current Outpatient Medications  Medication Sig Dispense Refill  . carvedilol  (COREG CR) 10 MG 24 hr capsule Take 10 mg by mouth daily.    . cephALEXin (KEFLEX) 500 MG capsule Take 1 capsule (500 mg total) by mouth 4 (four) times daily. 40 capsule 0  . Continuous Blood Gluc Sensor (FREESTYLE LIBRE SENSOR SYSTEM) MISC Use one sensor every 10 days. 3 each 2  . ferric citrate (AURYXIA) 1 GM 210 MG(Fe) tablet Take by mouth.    Marland Kitchen HUMALOG KWIKPEN 100 UNIT/ML KiwkPen Inject 12-18 Units into the skin 3 (three) times daily before meals.  0  . LANTUS 100 UNIT/ML injection Inject 30 Units into the skin at bedtime.  10  . lisinopril (PRINIVIL,ZESTRIL) 20 MG tablet Take by mouth.    Marland Kitchen amLODipine (NORVASC) 10 MG tablet TAKE 1 TABLET BY MOUTH EVERY DAY (Patient not taking: Reported on 05/22/2017) 15 tablet 0   No current facility-administered medications for this visit.     REVIEW OF SYSTEMS:  [X]  denotes positive finding, [ ]  denotes negative finding Cardiac  Comments:  Chest pain or chest pressure:    Shortness of breath upon exertion:    Short of breath when lying flat:    Irregular heart rhythm:        Vascular    Pain in calf, thigh, or hip brought on by ambulation:    Pain in feet at night that wakes you up from your sleep:     Blood clot in your veins:    Leg swelling:         Pulmonary    Oxygen at home:    Productive cough:     Wheezing:         Neurologic    Sudden weakness in arms or legs:     Sudden numbness in arms or legs:     Sudden onset of difficulty speaking or slurred speech:    Temporary loss of vision in one eye:     Problems with dizziness:         Gastrointestinal    Blood in stool:     Vomited blood:         Genitourinary    Burning when urinating:     Blood in urine:        Psychiatric    Major depression:         Hematologic    Bleeding problems:    Problems with blood clotting too easily:        Skin    Rashes or ulcers:        Constitutional    Fever or  chills:      PHYSICAL EXAM: Vitals:   05/22/17 0920 05/22/17 0923    BP: (!) 147/81 137/82  Pulse: 97 97  Resp: 18   Temp: 99 F (37.2 C)   TempSrc: Oral   SpO2: 97%   Weight: 238 lb (108 kg)   Height: 6\' 3"  (1.905 m)     GENERAL: The patient is a well-nourished male, in no acute distress. The vital signs are documented above. CARDIOVASCULAR: 2+ radial pulses bilaterally.  2+ left dorsalis pedis pulse.  He has 2+ femoral pulses bilaterally.  I did not palpate popliteal or distal pulses on the right.  He does have audible Doppler flow at the dorsalis pedis and posterior tibial on the right PULMONARY: There is good air exchange  ABDOMEN: Soft and non-tender  MUSCULOSKELETAL: There are no major deformities or cyanosis. NEUROLOGIC: No focal weakness or paresthesias are detected. SKIN: Has a ulceration which appears to be healing on the plantar aspect of his right great toe.  Has dry gangrene of half of his second toe and cyanosis of his third toe.  The fourth and fifth toe are normal PSYCHIATRIC: The patient has a normal affect.  DATA:  Noninvasive ankle arm index in our office today.  He has monophasic waveforms at the right foot and triphasic at the left foot.  His vessels are noncompressible therefore ankle arm index is not reliable.  He has markedly diminished toe brachial index on the right and normal on the left  MEDICAL ISSUES: Had long discussion with patient regarding this.  He appears to have right lower extremity arterial insufficiency related to SFA or popliteal disease.  I am concerned regarding any healing for toe amputation is required on the right.  Have recommended that he undergo arteriography for further evaluation.  He has dialysis on Monday Wednesday and Friday and therefore schedule this at his next nondialysis day on Tuesday, April 16.  He understands that if he has options for endovascular treatment, this will be done at the same setting.  He has scheduled follow-up with Dr. Sharol Given in 2 weeks regarding his toe gangrene.   Rosetta Posner,  MD FACS [facility] Tel [phone] Pager [phone]

## 2017-05-23 DIAGNOSIS — N2581 Secondary hyperparathyroidism of renal origin: Secondary | ICD-10-CM | POA: Diagnosis not present

## 2017-05-23 DIAGNOSIS — N186 End stage renal disease: Secondary | ICD-10-CM | POA: Diagnosis not present

## 2017-05-26 DIAGNOSIS — N186 End stage renal disease: Secondary | ICD-10-CM | POA: Diagnosis not present

## 2017-05-26 DIAGNOSIS — N2581 Secondary hyperparathyroidism of renal origin: Secondary | ICD-10-CM | POA: Diagnosis not present

## 2017-05-26 DIAGNOSIS — R197 Diarrhea, unspecified: Secondary | ICD-10-CM | POA: Diagnosis not present

## 2017-05-27 ENCOUNTER — Ambulatory Visit (HOSPITAL_COMMUNITY)
Admission: RE | Admit: 2017-05-27 | Discharge: 2017-05-27 | Disposition: A | Discharge status: Home / Self Care | Payer: BLUE CROSS/BLUE SHIELD | Source: Ambulatory Visit | Attending: Surgery | Admitting: Surgery

## 2017-05-27 ENCOUNTER — Ambulatory Visit (HOSPITAL_COMMUNITY)
Admission: RE | Disposition: A | Discharge status: Home / Self Care | Payer: Self-pay | Source: Ambulatory Visit | Attending: Surgery

## 2017-05-27 DIAGNOSIS — E10621 Type 1 diabetes mellitus with foot ulcer: Secondary | ICD-10-CM | POA: Diagnosis not present

## 2017-05-27 DIAGNOSIS — Z992 Dependence on renal dialysis: Secondary | ICD-10-CM | POA: Insufficient documentation

## 2017-05-27 DIAGNOSIS — I132 Hypertensive heart and chronic kidney disease with heart failure and with stage 5 chronic kidney disease, or end stage renal disease: Secondary | ICD-10-CM | POA: Diagnosis not present

## 2017-05-27 DIAGNOSIS — I70261 Atherosclerosis of native arteries of extremities with gangrene, right leg: Secondary | ICD-10-CM | POA: Diagnosis not present

## 2017-05-27 DIAGNOSIS — N186 End stage renal disease: Secondary | ICD-10-CM | POA: Diagnosis not present

## 2017-05-27 DIAGNOSIS — E785 Hyperlipidemia, unspecified: Secondary | ICD-10-CM | POA: Insufficient documentation

## 2017-05-27 DIAGNOSIS — E1052 Type 1 diabetes mellitus with diabetic peripheral angiopathy with gangrene: Secondary | ICD-10-CM | POA: Diagnosis not present

## 2017-05-27 DIAGNOSIS — I70242 Atherosclerosis of native arteries of left leg with ulceration of calf: Secondary | ICD-10-CM | POA: Diagnosis not present

## 2017-05-27 DIAGNOSIS — E1022 Type 1 diabetes mellitus with diabetic chronic kidney disease: Secondary | ICD-10-CM | POA: Diagnosis not present

## 2017-05-27 DIAGNOSIS — L97519 Non-pressure chronic ulcer of other part of right foot with unspecified severity: Secondary | ICD-10-CM | POA: Diagnosis not present

## 2017-05-27 DIAGNOSIS — I471 Supraventricular tachycardia: Secondary | ICD-10-CM | POA: Insufficient documentation

## 2017-05-27 DIAGNOSIS — I5032 Chronic diastolic (congestive) heart failure: Secondary | ICD-10-CM | POA: Insufficient documentation

## 2017-05-27 DIAGNOSIS — K219 Gastro-esophageal reflux disease without esophagitis: Secondary | ICD-10-CM | POA: Diagnosis not present

## 2017-05-27 HISTORY — PX: LOWER EXTREMITY ANGIOGRAPHY: CATH118251

## 2017-05-27 LAB — POCT I-STAT, CHEM 8
BUN: 37 mg/dL — AB (ref 6–20)
CHLORIDE: 95 mmol/L — AB (ref 101–111)
Calcium, Ion: 0.96 mmol/L — ABNORMAL LOW (ref 1.15–1.40)
Creatinine, Ser: 9.8 mg/dL — ABNORMAL HIGH (ref 0.61–1.24)
Glucose, Bld: 324 mg/dL — ABNORMAL HIGH (ref 65–99)
HCT: 43 % (ref 39.0–52.0)
Hemoglobin: 14.6 g/dL (ref 13.0–17.0)
POTASSIUM: 4.8 mmol/L (ref 3.5–5.1)
SODIUM: 133 mmol/L — AB (ref 135–145)
TCO2: 28 mmol/L (ref 22–32)

## 2017-05-27 LAB — GLUCOSE, CAPILLARY: Glucose-Capillary: 277 mg/dL — ABNORMAL HIGH (ref 65–99)

## 2017-05-27 SURGERY — LOWER EXTREMITY ANGIOGRAPHY
Anesthesia: LOCAL

## 2017-05-27 MED ORDER — SODIUM CHLORIDE 0.9% FLUSH: 3.0000 mL | Freq: Two times a day (BID) | INTRAVENOUS | Status: DC

## 2017-05-27 MED ORDER — HEPARIN (PORCINE) IN NACL 2-0.9 UNIT/ML-% IJ SOLN
INTRAMUSCULAR | Status: AC | PRN
  Administered 2017-05-27 (×2): 500 mL via INTRA_ARTERIAL

## 2017-05-27 MED ORDER — LIDOCAINE HCL (PF) 1 % IJ SOLN
INTRAMUSCULAR | Status: AC
  Filled 2017-05-27: qty 30

## 2017-05-27 MED ORDER — FENTANYL CITRATE (PF) 100 MCG/2ML IJ SOLN
INTRAMUSCULAR | Status: DC | PRN
  Administered 2017-05-27: 50 ug via INTRAVENOUS

## 2017-05-27 MED ORDER — HYDRALAZINE HCL 20 MG/ML IJ SOLN: 5.0000 mg | INTRAMUSCULAR | Status: DC | PRN

## 2017-05-27 MED ORDER — IODIXANOL 320 MG/ML IV SOLN
INTRAVENOUS | Status: DC | PRN
  Administered 2017-05-27: 118 mL via INTRA_ARTERIAL

## 2017-05-27 MED ORDER — ACETAMINOPHEN 325 MG PO TABS: 650.0000 mg | ORAL_TABLET | ORAL | Status: DC | PRN

## 2017-05-27 MED ORDER — ONDANSETRON HCL 4 MG/2ML IJ SOLN
4.0000 mg | Freq: Four times a day (QID) | INTRAMUSCULAR | Status: DC | PRN
Start: 2017-05-27 — End: 2017-05-27

## 2017-05-27 MED ORDER — MORPHINE SULFATE (PF) 10 MG/ML IV SOLN: 2.0000 mg | INTRAVENOUS | Status: DC | PRN

## 2017-05-27 MED ORDER — SODIUM CHLORIDE 0.9% FLUSH: 3.0000 mL | INTRAVENOUS | Status: DC | PRN

## 2017-05-27 MED ORDER — LABETALOL HCL 5 MG/ML IV SOLN
10.0000 mg | INTRAVENOUS | Status: DC | PRN
Start: 2017-05-27 — End: 2017-05-27

## 2017-05-27 MED ORDER — INSULIN ASPART 100 UNIT/ML ~~LOC~~ SOLN
10.0000 [IU] | Freq: Once | SUBCUTANEOUS | Status: AC
  Administered 2017-05-27: 10 [IU] via SUBCUTANEOUS

## 2017-05-27 MED ORDER — INSULIN ASPART 100 UNIT/ML ~~LOC~~ SOLN
SUBCUTANEOUS | Status: AC
  Filled 2017-05-27: qty 1

## 2017-05-27 MED ORDER — LIDOCAINE HCL (PF) 1 % IJ SOLN
INTRAMUSCULAR | Status: DC | PRN
  Administered 2017-05-27: 15 mL

## 2017-05-27 MED ORDER — MIDAZOLAM HCL 2 MG/2ML IJ SOLN
INTRAMUSCULAR | Status: DC | PRN
  Administered 2017-05-27: 2 mg via INTRAVENOUS

## 2017-05-27 MED ORDER — FENTANYL CITRATE (PF) 100 MCG/2ML IJ SOLN
INTRAMUSCULAR | Status: AC
  Filled 2017-05-27: qty 2

## 2017-05-27 MED ORDER — MIDAZOLAM HCL 2 MG/2ML IJ SOLN
INTRAMUSCULAR | Status: AC
  Filled 2017-05-27: qty 2

## 2017-05-27 MED ORDER — SODIUM CHLORIDE 0.9 % IV SOLN: 250.0000 mL | INTRAVENOUS | Status: DC | PRN

## 2017-05-27 MED ORDER — HEPARIN (PORCINE) IN NACL 2-0.9 UNIT/ML-% IJ SOLN
INTRAMUSCULAR | Status: AC
  Filled 2017-05-27: qty 1000

## 2017-05-27 SURGICAL SUPPLY — 13 items
CATH OMNI FLUSH 5F 65CM (CATHETERS) ×2 IMPLANT
CATH SOFT-VU 4F 65 STRAIGHT (CATHETERS) ×1 IMPLANT
CATH SOFT-VU STRAIGHT 4F 65CM (CATHETERS) ×1
COVER PRB 48X5XTLSCP FOLD TPE (BAG) ×1 IMPLANT
COVER PROBE 5X48 (BAG) ×1
DEVICE CLOSURE MYNXGRIP 5F (Vascular Products) ×2 IMPLANT
KIT MICROPUNCTURE NIT STIFF (SHEATH) ×2 IMPLANT
KIT PV (KITS) ×2 IMPLANT
SHEATH AVANTI 11CM 5FR (SHEATH) ×2 IMPLANT
SYR MEDRAD MARK V 150ML (SYRINGE) ×2 IMPLANT
TRANSDUCER W/STOPCOCK (MISCELLANEOUS) ×2 IMPLANT
TRAY PV CATH (CUSTOM PROCEDURE TRAY) ×2 IMPLANT
WIRE BENTSON .035X145CM (WIRE) ×2 IMPLANT

## 2017-05-27 NOTE — Research (Signed)
[employer] Research study: Dr. Trula Slade and I  Spoke with patient about potential candidate for research study. After reviewing Inclusion/Exclusion patient is pre~transplant and Creatinine is 11.20. I will email patient with notification for exclusion.

## 2017-05-27 NOTE — Discharge Instructions (Signed)

## 2017-05-27 NOTE — Op Note (Signed)
    Patient name: Lance [name] MRN: [id number] DOB: 1971/01/25 Sex: male  05/27/2017 Pre-operative Diagnosis: Right leg ulcer  Post-operative diagnosis:  Same Surgeon:  [name] Procedure Performed:  1.  ultrasound-guided access, left femoral artery  2.  Abdominal aortogram  3.  Bilateral lower extremity runoff  4.  Conscious sedation (38 minutes)  5.  Second-order catheterization  6.  Closure device     Indications: The patient comes in today for further evaluation of blood flow to his right foot in the setting of a nonhealing wound.  Procedure:  The patient was identified in the holding area and taken to room 8.  The patient was then placed supine on the table and prepped and draped in the usual sterile fashion.  A time out was called.  Conscious sedation was administered with the use of IV fentanyl and Versed.  Heart rate, blood pressure, and oxygen saturations were continuously monitored.  Ultrasound was used to evaluate the left common femoral artery.  It was patent .  A digital ultrasound image was acquired.  A micropuncture needle was used to access the left common femoral artery under ultrasound guidance.  An 018 wire was advanced without resistance and a micropuncture sheath was placed.  The 018 wire was removed and a benson wire was placed.  The micropuncture sheath was exchanged for a 5 french sheath.  An omniflush catheter was advanced over the wire to the level of L-1.  An abdominal angiogram was obtained.  Next, using the omniflush catheter and a benson wire, the aortic bifurcation was crossed and the catheter was placed into theright external iliac artery and right runoff was obtained.  left runoff was performed via retrograde sheath injections.  Findings:   Aortogram: No significant renal artery stenosis.  The infrarenal abdominal aorta is widely patent.  Bilateral common and external iliac arteries are widely patent.  Right Lower Extremity: Right common femoral  profunda femoral artery are widely patent.  The superficial femoral artery is widely patent but heavily calcified.  Popliteal artery is patent throughout its course.  There is two-vessel runoff via the posterior tibial and peroneal artery.  There is occlusion of the anterior tibial artery proximally.  Diffuse disease is seen out onto the foot.  Left Lower Extremity: Left common femoral and profunda femoral artery widely patent.  Superficial femoral artery is patent but heavily calcified.  Popliteal arteries widely patent.  Limited evaluation of the runoff was obtained secondary to patient movement.  Intervention: None  Impression:  #1  No options for revascularization.  The patient has a patent peroneal artery down to the ankle and a patent posterior tibial artery down to the ankle.  The majority of the disease is in the small vessels out onto the foot.   Theotis Burrow, M.D. [facility] Office: [phone] Pager:  [phone]

## 2017-05-28 ENCOUNTER — Encounter (HOSPITAL_COMMUNITY): Payer: Self-pay | Admitting: Surgery

## 2017-05-28 DIAGNOSIS — R197 Diarrhea, unspecified: Secondary | ICD-10-CM | POA: Diagnosis not present

## 2017-05-28 DIAGNOSIS — N186 End stage renal disease: Secondary | ICD-10-CM | POA: Diagnosis not present

## 2017-05-28 DIAGNOSIS — N2581 Secondary hyperparathyroidism of renal origin: Secondary | ICD-10-CM | POA: Diagnosis not present

## 2017-05-28 MED FILL — Heparin Sod (Porcine)-NaCl IV Soln 1000 Unit/500ML-0.9%: INTRAVENOUS | Qty: 1000 | Status: AC

## 2017-05-28 NOTE — Interval H&P Note (Signed)
History and Physical Interval Note:  05/28/2017 8:30 AM  Lance [name]  has presented today for surgery, with the diagnosis of pvd with ganegrene of Right foot  The various methods of treatment have been discussed with the patient and family. After consideration of risks, benefits and other options for treatment, the patient has consented to  Procedure(s): LOWER EXTREMITY ANGIOGRAPHY (N/A) as a surgical intervention .  The patient's history has been reviewed, patient examined, no change in status, stable for surgery.  I have reviewed the patient's chart and labs.  Questions were answered to the patient's satisfaction.     [name]

## 2017-05-29 ENCOUNTER — Ambulatory Visit (INDEPENDENT_AMBULATORY_CARE_PROVIDER_SITE_OTHER): Payer: BLUE CROSS/BLUE SHIELD | Admitting: Orthopedic Surgery

## 2017-05-29 ENCOUNTER — Ambulatory Visit (HOSPITAL_COMMUNITY)
Admission: RE | Admit: 2017-05-29 | Discharge: 2017-05-29 | Disposition: A | Discharge status: Home / Self Care | Payer: BLUE CROSS/BLUE SHIELD | Source: Ambulatory Visit | Attending: Orthopedic Surgery | Admitting: Orthopedic Surgery

## 2017-05-29 ENCOUNTER — Encounter (INDEPENDENT_AMBULATORY_CARE_PROVIDER_SITE_OTHER): Payer: Self-pay | Admitting: Orthopedic Surgery

## 2017-05-29 ENCOUNTER — Other Ambulatory Visit (INDEPENDENT_AMBULATORY_CARE_PROVIDER_SITE_OTHER): Payer: Self-pay | Admitting: Orthopedic Surgery

## 2017-05-29 ENCOUNTER — Other Ambulatory Visit (INDEPENDENT_AMBULATORY_CARE_PROVIDER_SITE_OTHER): Payer: Self-pay

## 2017-05-29 ENCOUNTER — Ambulatory Visit (INDEPENDENT_AMBULATORY_CARE_PROVIDER_SITE_OTHER): Payer: BLUE CROSS/BLUE SHIELD

## 2017-05-29 VITALS — Ht 75.0 in | Wt 240.0 lb

## 2017-05-29 DIAGNOSIS — M79661 Pain in right lower leg: Secondary | ICD-10-CM

## 2017-05-29 DIAGNOSIS — I70243 Atherosclerosis of native arteries of left leg with ulceration of ankle: Secondary | ICD-10-CM

## 2017-05-29 DIAGNOSIS — I96 Gangrene, not elsewhere classified: Secondary | ICD-10-CM

## 2017-05-29 NOTE — Progress Notes (Signed)
Office Visit Note   Patient: Lance [name]           Date of Birth: 21-May-1970           MRN: [id number] Visit Date: 05/29/2017              Requested by: [name], [address] [place], [place] [address] PCP: [name], MD  Chief Complaint  Patient presents with  . Right Foot - Follow-up    2nd and 3rd toe gangrene       HPI: Patient is a 47 year old gentleman diabetic insensate neuropathy on dialysis Monday Wednesday Friday with gangrene of the second and third toes right foot.  Patient is status post vascular studies with Dr. Trula Slade.  Patient's studies do not show any revascularization options.  Patient also states is been having some medial thigh and medial leg pain for the past several weeks that preceded his vascular study.  Assessment & Plan: Visit Diagnoses:  1. Gangrene of toe of right foot ([place])   2. Right calf pain     Plan: We will set him up for an ultrasound to rule out DVT in the right calf or thigh.  Discussed that with the circulation studies patient does not have any foot salvage options and we would need to proceed with a transtibial amputation.  Patient has dry gangrene of the toes there is no ascending cellulitis he has no pain we will set him up for transtibial amputation next week.  Will need to coordinate this with his dialysis.  Follow-Up Instructions: Return in about 2 weeks (around 06/12/2017).   Ortho Exam  Patient is alert, oriented, no adenopathy, well-dressed, normal affect, normal respiratory effort. Examination patient has an antalgic gait he does not have palpable pulses in the right foot.  Review of the vascular study shows no options for revascularization he has good circulation down to the ankle.  Patient is also tender to Palpation over the medial calf and medial thigh on the right there is no swelling but the tenderness is concerning for possible DVT.  Imaging: No results found. No images are attached to  the encounter.  Labs: Lab Results  Component Value Date   HGBA1C 9.1 (H) 02/18/2017   HGBA1C 9.8 (H) 11/28/2015   HGBA1C 9.7 (H) 12/26/2013   ESRSEDRATE 120 (H) 11/13/2015   CRP 35.4 (H) 11/13/2015   REPTSTATUS 12/06/2015 FINAL 11/30/2015   REPTSTATUS 11/30/2015 FINAL 11/30/2015   GRAMSTAIN  11/30/2015    WBC PRESENT,BOTH PMN AND MONONUCLEAR NO ORGANISMS SEEN CYTOSPIN SMEAR    CULT NO GROWTH 5 DAYS 11/30/2015   LABORGA STREPTOCOCCUS PNEUMONIAE 11/12/2015    @LABSALLVALUES (HGBA1)@  Body mass index is 30 kg/m.  Orders:  No orders of the defined types were placed in this encounter.  No orders of the defined types were placed in this encounter.    Procedures: No procedures performed  Clinical Data: No additional findings.  ROS:  All other systems negative, except as noted in the HPI. Review of Systems  Objective: Vital Signs: Ht 6\' 3"  (1.905 m)   Wt 240 lb (108.9 kg)   BMI 30.00 kg/m   Specialty Comments:  No specialty comments available.  PMFS History: Patient Active Problem List   Diagnosis Date Noted  . PVD (peripheral vascular disease) ([place]) 05/13/2017  . Bilateral pleural effusion 11/28/2015  . Loculated pleural effusion 11/28/2015  . HCAP (healthcare-associated pneumonia) 11/28/2015  . ESRD (end stage renal disease) on  dialysis ([place]) 11/12/2015  . Hyperkalemia 11/12/2015  . Shoulder pain, acute 11/12/2015  . Nausea & vomiting 11/12/2015  . Diarrhea 11/12/2015  . Hypertensive urgency 05/05/2014  . Nephrotic syndrome 12/29/2013  . Hyponatremia 12/28/2013  . Acute diastolic heart failure ([place]) 12/28/2013  . Hypoglycemia 12/28/2013  . Dyspnea   . Acute renal failure syndrome ([place])   . Diabetic ketoacidosis without coma associated with type 1 diabetes mellitus ([place])   . Demand ischemia ([place]) 12/24/2013  . DKA (diabetic ketoacidoses) ([place]) 12/23/2013  . CAP (community acquired pneumonia) 12/23/2013  . Sepsis due to pneumonia ([place]) 12/23/2013  .  Metabolic acidemia 12/05/8525  . Acute renal failure ([place]) 12/23/2013  . Diabetic ketoacidosis ([place]) 12/23/2013  . Chronic diastolic heart failure ([place]) 04/14/2013  . PSVT (paroxysmal supraventricular tachycardia) ([place]) 04/14/2013  . Bilateral leg edema 03/26/2013  . Chronic kidney disease, stage III (moderate) ([place]) 09/29/2011  . 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 Essential hypertension, benign 09/26/2010  . Hyperlipidemia 09/26/2010   Past Medical History:  Diagnosis Date  . Aortic stenosis    Very mild in 05/2007  . Arthritis    "right shoulder; right knee; feel like I've got it all over" (11/28/2015)  . Degenerative joint disease    Left TKA  . Diastolic heart failure (HCC)    LVEF 65-70% with grade 2 diastolic dysfunction  . ESRD (end stage renal disease) on dialysis ([place])    "MWF; [facility] [address]" (11/28/2015)  . Essential hypertension, benign 2000   LVH  . GERD (gastroesophageal reflux disease)   . HCAP (healthcare-associated pneumonia) 11/28/2015  . Hyperlipidemia 2000  . Loculated pleural effusion 11/28/2015   [name] 11/28/2015  . Pneumonia 12/2013; 05/2014  . PSVT (paroxysmal supraventricular tachycardia) (HCC)    Nonsustained; asymptomatic; diagnosed by event recorder in 2006  . Tobacco abuse, in remission    20 pack years; discontinued in 1985; bronchitic changes on chest x-ray and 2009  . Type 1 diabetes mellitus ([place]) 1990    Family History  Problem Relation Age of Onset  . Diabetes Father   . Hypertension Father   . Diabetes Mother   . Hypertension Mother     Past Surgical History:  Procedure Laterality Date  . AV FISTULA PLACEMENT Right 06/23/2014   Procedure: ARTERIOVENOUS (AV) FISTULA CREATION;  Surgeon: Angelia Mould, MD;  Location: [facility];  Service: Vascular;  Laterality: Right;  . EYE SURGERY Bilateral    "for glaucoma"  . INGUINAL HERNIA REPAIR Right    As a child  . LOWER EXTREMITY ANGIOGRAPHY  N/A 05/27/2017   Procedure: LOWER EXTREMITY ANGIOGRAPHY;  Surgeon: [name], MD;  Location: [facility];  Service: Cardiovascular;  Laterality: N/A;  . WISDOM TOOTH EXTRACTION  1987   Social History   Occupational History  . Not on file  Tobacco Use  . Smoking status: Never Smoker  . Smokeless tobacco: Never Used  Substance and Sexual Activity  . Alcohol use: Yes    Comment: 11/28/2015 "used to drink; stopped ~ 2 yr ago"  . Drug use: No  . Sexual activity: Yes

## 2017-05-29 NOTE — Progress Notes (Signed)
Right lower extremity venous duplex completed. Preliminary results. There is no evidence of DVT,superficial thrombosis, or Baker's cyst. Toma Copier, RVS  05/29/2017, 1:48 PM

## 2017-05-29 NOTE — Addendum Note (Signed)
Addended by: Pamella Pert on: 05/29/2017 10:15 AM   Modules accepted: Orders

## 2017-05-30 DIAGNOSIS — N186 End stage renal disease: Secondary | ICD-10-CM | POA: Diagnosis not present

## 2017-05-30 DIAGNOSIS — N2581 Secondary hyperparathyroidism of renal origin: Secondary | ICD-10-CM | POA: Diagnosis not present

## 2017-05-30 DIAGNOSIS — R197 Diarrhea, unspecified: Secondary | ICD-10-CM | POA: Diagnosis not present

## 2017-06-02 ENCOUNTER — Other Ambulatory Visit (INDEPENDENT_AMBULATORY_CARE_PROVIDER_SITE_OTHER): Payer: Self-pay | Admitting: Orthopedic Surgery

## 2017-06-02 ENCOUNTER — Telehealth (INDEPENDENT_AMBULATORY_CARE_PROVIDER_SITE_OTHER): Payer: Self-pay | Admitting: Orthopedic Surgery

## 2017-06-02 DIAGNOSIS — R52 Pain, unspecified: Secondary | ICD-10-CM | POA: Diagnosis not present

## 2017-06-02 DIAGNOSIS — R509 Fever, unspecified: Secondary | ICD-10-CM | POA: Diagnosis not present

## 2017-06-02 DIAGNOSIS — R6883 Chills (without fever): Secondary | ICD-10-CM | POA: Diagnosis not present

## 2017-06-02 DIAGNOSIS — N186 End stage renal disease: Secondary | ICD-10-CM | POA: Diagnosis not present

## 2017-06-02 DIAGNOSIS — N2581 Secondary hyperparathyroidism of renal origin: Secondary | ICD-10-CM | POA: Diagnosis not present

## 2017-06-02 MED ORDER — HYDROCODONE-ACETAMINOPHEN 5-325 MG PO TABS: 1.0000 | ORAL_TABLET | Freq: Four times a day (QID) | ORAL | 0 refills | Status: DC | PRN

## 2017-06-02 NOTE — Telephone Encounter (Signed)
Prescription written for Vicodin for pickup

## 2017-06-02 NOTE — Telephone Encounter (Signed)
Patient is asking for something for pain.  He does not have any pain meds right now.  [facility] [place].

## 2017-06-02 NOTE — Telephone Encounter (Signed)
Patient called and left voicemail asking for a refill on his pain medication. CB # [phone]

## 2017-06-02 NOTE — Telephone Encounter (Signed)
IC patient to clarify what medication he needs a refill of. LMVM for him to call and clarify.

## 2017-06-03 ENCOUNTER — Ambulatory Visit: Payer: BLUE CROSS/BLUE SHIELD | Admitting: Nutrition

## 2017-06-03 ENCOUNTER — Ambulatory Visit: Payer: BLUE CROSS/BLUE SHIELD | Admitting: "Endocrinology

## 2017-06-03 NOTE — Telephone Encounter (Signed)
Called pt to advise rx is at the front desk for pick up.

## 2017-06-03 NOTE — Telephone Encounter (Signed)
Do you have this Rx?  Can you call patient please?  Thanks.

## 2017-06-04 DIAGNOSIS — R509 Fever, unspecified: Secondary | ICD-10-CM | POA: Diagnosis not present

## 2017-06-04 DIAGNOSIS — R52 Pain, unspecified: Secondary | ICD-10-CM | POA: Diagnosis not present

## 2017-06-04 DIAGNOSIS — N186 End stage renal disease: Secondary | ICD-10-CM | POA: Diagnosis not present

## 2017-06-04 DIAGNOSIS — R6883 Chills (without fever): Secondary | ICD-10-CM | POA: Diagnosis not present

## 2017-06-04 DIAGNOSIS — N2581 Secondary hyperparathyroidism of renal origin: Secondary | ICD-10-CM | POA: Diagnosis not present

## 2017-06-05 ENCOUNTER — Other Ambulatory Visit (INDEPENDENT_AMBULATORY_CARE_PROVIDER_SITE_OTHER): Payer: Self-pay | Admitting: Orthopedic Surgery

## 2017-06-05 ENCOUNTER — Encounter (HOSPITAL_COMMUNITY): Payer: Self-pay | Admitting: *Deleted

## 2017-06-05 ENCOUNTER — Other Ambulatory Visit: Payer: Self-pay

## 2017-06-05 DIAGNOSIS — R6883 Chills (without fever): Secondary | ICD-10-CM | POA: Diagnosis not present

## 2017-06-05 DIAGNOSIS — I96 Gangrene, not elsewhere classified: Secondary | ICD-10-CM

## 2017-06-05 DIAGNOSIS — N186 End stage renal disease: Secondary | ICD-10-CM | POA: Diagnosis not present

## 2017-06-05 DIAGNOSIS — R509 Fever, unspecified: Secondary | ICD-10-CM | POA: Diagnosis not present

## 2017-06-05 DIAGNOSIS — R52 Pain, unspecified: Secondary | ICD-10-CM | POA: Diagnosis not present

## 2017-06-05 DIAGNOSIS — N2581 Secondary hyperparathyroidism of renal origin: Secondary | ICD-10-CM | POA: Diagnosis not present

## 2017-06-05 NOTE — Progress Notes (Addendum)
Lance [name] has a history of Type I diabetes, He reports that his CBG was > 300 a few days ago, "I wasn't eating or taking my medications, I'm taking medication and eating now and it was 190 this am."  Patient reports that A1C drawn at the kidney center in April was 8.1. I encouraged patient to try to keep CBG < 180 for healing and to prevent post op infections. I instructed patient to check CBG after awaking and every 2 hours until arrival  to the hospital.  I Instructed patient if CBG is less than 70 to take 4 Glucose Tablets or 1 tube of Glucose Gel. Recheck CBG in 15 minutes then call pre- op desk at [phone] for further instructions. If scheduled to receive Insulin, do not take Insulin. I insturcted him if CBG > 220 to take 1/2 dose of SS Insulin.  mitral regurgitation [place] is on dialysis MWF, he is being dialyzed today, since he is having surgery in am.

## 2017-06-06 ENCOUNTER — Encounter (HOSPITAL_COMMUNITY)
Admission: RE | Disposition: A | Discharge status: Rehabilitation Facility | Payer: Self-pay | Source: Ambulatory Visit | Attending: Orthopedic Surgery

## 2017-06-06 ENCOUNTER — Inpatient Hospital Stay (HOSPITAL_COMMUNITY): Payer: BLUE CROSS/BLUE SHIELD | Admitting: Anesthesiology

## 2017-06-06 ENCOUNTER — Encounter (HOSPITAL_COMMUNITY): Payer: Self-pay | Admitting: Surgery

## 2017-06-06 ENCOUNTER — Inpatient Hospital Stay (HOSPITAL_COMMUNITY)
Admission: RE | Admit: 2017-06-06 | Discharge: 2017-06-10 | DRG: 239 | Disposition: A | Discharge status: Rehabilitation Facility | Payer: BLUE CROSS/BLUE SHIELD | Source: Ambulatory Visit | Attending: Orthopedic Surgery | Admitting: Orthopedic Surgery

## 2017-06-06 DIAGNOSIS — L02611 Cutaneous abscess of right foot: Secondary | ICD-10-CM | POA: Diagnosis not present

## 2017-06-06 DIAGNOSIS — K5900 Constipation, unspecified: Secondary | ICD-10-CM | POA: Diagnosis not present

## 2017-06-06 DIAGNOSIS — R269 Unspecified abnormalities of gait and mobility: Secondary | ICD-10-CM | POA: Diagnosis not present

## 2017-06-06 DIAGNOSIS — Z794 Long term (current) use of insulin: Secondary | ICD-10-CM

## 2017-06-06 DIAGNOSIS — K219 Gastro-esophageal reflux disease without esophagitis: Secondary | ICD-10-CM | POA: Diagnosis present

## 2017-06-06 DIAGNOSIS — E1052 Type 1 diabetes mellitus with diabetic peripheral angiopathy with gangrene: Secondary | ICD-10-CM | POA: Diagnosis not present

## 2017-06-06 DIAGNOSIS — D638 Anemia in other chronic diseases classified elsewhere: Secondary | ICD-10-CM

## 2017-06-06 DIAGNOSIS — E1042 Type 1 diabetes mellitus with diabetic polyneuropathy: Secondary | ICD-10-CM | POA: Diagnosis not present

## 2017-06-06 DIAGNOSIS — E1065 Type 1 diabetes mellitus with hyperglycemia: Secondary | ICD-10-CM | POA: Diagnosis not present

## 2017-06-06 DIAGNOSIS — E1022 Type 1 diabetes mellitus with diabetic chronic kidney disease: Secondary | ICD-10-CM | POA: Diagnosis present

## 2017-06-06 DIAGNOSIS — N186 End stage renal disease: Secondary | ICD-10-CM | POA: Diagnosis not present

## 2017-06-06 DIAGNOSIS — R2689 Other abnormalities of gait and mobility: Secondary | ICD-10-CM | POA: Diagnosis present

## 2017-06-06 DIAGNOSIS — Z9181 History of falling: Secondary | ICD-10-CM

## 2017-06-06 DIAGNOSIS — I350 Nonrheumatic aortic (valve) stenosis: Secondary | ICD-10-CM | POA: Diagnosis present

## 2017-06-06 DIAGNOSIS — D62 Acute posthemorrhagic anemia: Secondary | ICD-10-CM | POA: Diagnosis not present

## 2017-06-06 DIAGNOSIS — R1112 Projectile vomiting: Secondary | ICD-10-CM | POA: Diagnosis not present

## 2017-06-06 DIAGNOSIS — S88111A Complete traumatic amputation at level between knee and ankle, right lower leg, initial encounter: Secondary | ICD-10-CM | POA: Diagnosis not present

## 2017-06-06 DIAGNOSIS — I120 Hypertensive chronic kidney disease with stage 5 chronic kidney disease or end stage renal disease: Secondary | ICD-10-CM | POA: Diagnosis not present

## 2017-06-06 DIAGNOSIS — N2581 Secondary hyperparathyroidism of renal origin: Secondary | ICD-10-CM | POA: Diagnosis not present

## 2017-06-06 DIAGNOSIS — I132 Hypertensive heart and chronic kidney disease with heart failure and with stage 5 chronic kidney disease, or end stage renal disease: Secondary | ICD-10-CM | POA: Diagnosis not present

## 2017-06-06 DIAGNOSIS — Z4781 Encounter for orthopedic aftercare following surgical amputation: Secondary | ICD-10-CM | POA: Diagnosis not present

## 2017-06-06 DIAGNOSIS — R000 Tachycardia, unspecified: Secondary | ICD-10-CM | POA: Diagnosis not present

## 2017-06-06 DIAGNOSIS — E8889 Other specified metabolic disorders: Secondary | ICD-10-CM | POA: Diagnosis not present

## 2017-06-06 DIAGNOSIS — I70201 Unspecified atherosclerosis of native arteries of extremities, right leg: Secondary | ICD-10-CM | POA: Diagnosis not present

## 2017-06-06 DIAGNOSIS — E1069 Type 1 diabetes mellitus with other specified complication: Secondary | ICD-10-CM | POA: Diagnosis not present

## 2017-06-06 DIAGNOSIS — Z992 Dependence on renal dialysis: Secondary | ICD-10-CM | POA: Diagnosis not present

## 2017-06-06 DIAGNOSIS — E1142 Type 2 diabetes mellitus with diabetic polyneuropathy: Secondary | ICD-10-CM | POA: Diagnosis not present

## 2017-06-06 DIAGNOSIS — Z87891 Personal history of nicotine dependence: Secondary | ICD-10-CM

## 2017-06-06 DIAGNOSIS — I471 Supraventricular tachycardia: Secondary | ICD-10-CM | POA: Diagnosis present

## 2017-06-06 DIAGNOSIS — Z79899 Other long term (current) drug therapy: Secondary | ICD-10-CM

## 2017-06-06 DIAGNOSIS — Z89511 Acquired absence of right leg below knee: Secondary | ICD-10-CM

## 2017-06-06 DIAGNOSIS — R0989 Other specified symptoms and signs involving the circulatory and respiratory systems: Secondary | ICD-10-CM | POA: Diagnosis not present

## 2017-06-06 DIAGNOSIS — D631 Anemia in chronic kidney disease: Secondary | ICD-10-CM | POA: Diagnosis present

## 2017-06-06 DIAGNOSIS — Z888 Allergy status to other drugs, medicaments and biological substances status: Secondary | ICD-10-CM

## 2017-06-06 DIAGNOSIS — M869 Osteomyelitis, unspecified: Secondary | ICD-10-CM | POA: Diagnosis not present

## 2017-06-06 DIAGNOSIS — E669 Obesity, unspecified: Secondary | ICD-10-CM | POA: Diagnosis present

## 2017-06-06 DIAGNOSIS — Z8679 Personal history of other diseases of the circulatory system: Secondary | ICD-10-CM | POA: Diagnosis not present

## 2017-06-06 DIAGNOSIS — S88111S Complete traumatic amputation at level between knee and ankle, right lower leg, sequela: Secondary | ICD-10-CM | POA: Diagnosis not present

## 2017-06-06 DIAGNOSIS — M86271 Subacute osteomyelitis, right ankle and foot: Secondary | ICD-10-CM | POA: Diagnosis not present

## 2017-06-06 DIAGNOSIS — Z96652 Presence of left artificial knee joint: Secondary | ICD-10-CM | POA: Diagnosis not present

## 2017-06-06 DIAGNOSIS — E119 Type 2 diabetes mellitus without complications: Secondary | ICD-10-CM

## 2017-06-06 DIAGNOSIS — Z833 Family history of diabetes mellitus: Secondary | ICD-10-CM

## 2017-06-06 DIAGNOSIS — M79661 Pain in right lower leg: Secondary | ICD-10-CM | POA: Diagnosis present

## 2017-06-06 DIAGNOSIS — I96 Gangrene, not elsewhere classified: Secondary | ICD-10-CM | POA: Diagnosis not present

## 2017-06-06 DIAGNOSIS — G8918 Other acute postprocedural pain: Secondary | ICD-10-CM

## 2017-06-06 DIAGNOSIS — Z8701 Personal history of pneumonia (recurrent): Secondary | ICD-10-CM | POA: Diagnosis not present

## 2017-06-06 DIAGNOSIS — E785 Hyperlipidemia, unspecified: Secondary | ICD-10-CM | POA: Diagnosis present

## 2017-06-06 DIAGNOSIS — E1169 Type 2 diabetes mellitus with other specified complication: Secondary | ICD-10-CM | POA: Diagnosis not present

## 2017-06-06 DIAGNOSIS — Z6829 Body mass index (BMI) 29.0-29.9, adult: Secondary | ICD-10-CM

## 2017-06-06 DIAGNOSIS — R7309 Other abnormal glucose: Secondary | ICD-10-CM | POA: Diagnosis not present

## 2017-06-06 DIAGNOSIS — Z8249 Family history of ischemic heart disease and other diseases of the circulatory system: Secondary | ICD-10-CM | POA: Diagnosis not present

## 2017-06-06 DIAGNOSIS — D72829 Elevated white blood cell count, unspecified: Secondary | ICD-10-CM | POA: Diagnosis not present

## 2017-06-06 DIAGNOSIS — I5032 Chronic diastolic (congestive) heart failure: Secondary | ICD-10-CM | POA: Diagnosis not present

## 2017-06-06 DIAGNOSIS — E10319 Type 1 diabetes mellitus with unspecified diabetic retinopathy without macular edema: Secondary | ICD-10-CM | POA: Diagnosis not present

## 2017-06-06 DIAGNOSIS — I2510 Atherosclerotic heart disease of native coronary artery without angina pectoris: Secondary | ICD-10-CM | POA: Diagnosis present

## 2017-06-06 DIAGNOSIS — E1165 Type 2 diabetes mellitus with hyperglycemia: Secondary | ICD-10-CM | POA: Diagnosis not present

## 2017-06-06 DIAGNOSIS — I10 Essential (primary) hypertension: Secondary | ICD-10-CM | POA: Diagnosis not present

## 2017-06-06 DIAGNOSIS — G546 Phantom limb syndrome with pain: Secondary | ICD-10-CM | POA: Diagnosis not present

## 2017-06-06 DIAGNOSIS — M19011 Primary osteoarthritis, right shoulder: Secondary | ICD-10-CM | POA: Diagnosis not present

## 2017-06-06 HISTORY — PX: AMPUTATION: SHX166

## 2017-06-06 LAB — BASIC METABOLIC PANEL
Anion gap: 20 — ABNORMAL HIGH (ref 5–15)
BUN: 23 mg/dL — AB (ref 6–20)
CALCIUM: 8.6 mg/dL — AB (ref 8.9–10.3)
CO2: 21 mmol/L — ABNORMAL LOW (ref 22–32)
Chloride: 90 mmol/L — ABNORMAL LOW (ref 101–111)
Creatinine, Ser: 7.08 mg/dL — ABNORMAL HIGH (ref 0.61–1.24)
GFR calc Af Amer: 9 mL/min — ABNORMAL LOW (ref >60)
GFR, EST NON AFRICAN AMERICAN: 8 mL/min — AB (ref >60)
GLUCOSE: 280 mg/dL — AB (ref 65–99)
Potassium: 3.8 mmol/L (ref 3.5–5.1)
Sodium: 131 mmol/L — ABNORMAL LOW (ref 135–145)

## 2017-06-06 LAB — GLUCOSE, CAPILLARY
GLUCOSE-CAPILLARY: 283 mg/dL — AB (ref 65–99)
GLUCOSE-CAPILLARY: 397 mg/dL — AB (ref 65–99)
GLUCOSE-CAPILLARY: 401 mg/dL — AB (ref 65–99)
GLUCOSE-CAPILLARY: 403 mg/dL — AB (ref 65–99)
GLUCOSE-CAPILLARY: 418 mg/dL — AB (ref 65–99)
GLUCOSE-CAPILLARY: 460 mg/dL — AB (ref 65–99)
GLUCOSE-CAPILLARY: 467 mg/dL — AB (ref 65–99)
Glucose-Capillary: 260 mg/dL — ABNORMAL HIGH (ref 65–99)
Glucose-Capillary: 454 mg/dL — ABNORMAL HIGH (ref 65–99)

## 2017-06-06 LAB — CBC
HCT: 34 % — ABNORMAL LOW (ref 39.0–52.0)
Hemoglobin: 11.5 g/dL — ABNORMAL LOW (ref 13.0–17.0)
MCH: 34 pg (ref 26.0–34.0)
MCHC: 33.8 g/dL (ref 30.0–36.0)
MCV: 100.6 fL — AB (ref 78.0–100.0)
PLATELETS: 489 10*3/uL — AB (ref 150–400)
RBC: 3.38 MIL/uL — ABNORMAL LOW (ref 4.22–5.81)
RDW: 14.8 % (ref 11.5–15.5)
WBC: 25 10*3/uL — ABNORMAL HIGH (ref 4.0–10.5)

## 2017-06-06 SURGERY — AMPUTATION BELOW KNEE
Anesthesia: Monitor Anesthesia Care | Laterality: Right

## 2017-06-06 MED ORDER — HYDROMORPHONE HCL 2 MG/ML IJ SOLN
0.5000 mg | INTRAMUSCULAR | Status: DC | PRN
  Administered 2017-06-07 – 2017-06-08 (×2): 0.5 mg via INTRAVENOUS
  Filled 2017-06-06 (×2): qty 1

## 2017-06-06 MED ORDER — DOCUSATE SODIUM 100 MG PO CAPS
100.0000 mg | ORAL_CAPSULE | Freq: Two times a day (BID) | ORAL | Status: DC
  Administered 2017-06-06 – 2017-06-10 (×7): 100 mg via ORAL
  Filled 2017-06-06 (×7): qty 1

## 2017-06-06 MED ORDER — METHOCARBAMOL 500 MG PO TABS
ORAL_TABLET | ORAL | Status: AC
  Administered 2017-06-06: 500 mg via ORAL
  Filled 2017-06-06: qty 1

## 2017-06-06 MED ORDER — INSULIN ASPART 100 UNIT/ML ~~LOC~~ SOLN
3.0000 [IU] | Freq: Three times a day (TID) | SUBCUTANEOUS | Status: DC
  Administered 2017-06-06 – 2017-06-10 (×10): 3 [IU] via SUBCUTANEOUS

## 2017-06-06 MED ORDER — SODIUM CHLORIDE 0.9 % IV SOLN
INTRAVENOUS | Status: DC
  Administered 2017-06-06 (×2): via INTRAVENOUS

## 2017-06-06 MED ORDER — MIDAZOLAM HCL 2 MG/2ML IJ SOLN
INTRAMUSCULAR | Status: AC
  Filled 2017-06-06: qty 2

## 2017-06-06 MED ORDER — FENTANYL CITRATE (PF) 100 MCG/2ML IJ SOLN
100.0000 ug | Freq: Once | INTRAMUSCULAR | Status: AC
  Administered 2017-06-06: 100 ug via INTRAVENOUS

## 2017-06-06 MED ORDER — ONDANSETRON HCL 4 MG/2ML IJ SOLN: 4.0000 mg | Freq: Four times a day (QID) | INTRAMUSCULAR | Status: DC | PRN

## 2017-06-06 MED ORDER — DEXAMETHASONE SODIUM PHOSPHATE 4 MG/ML IJ SOLN
INTRAMUSCULAR | Status: DC | PRN
  Administered 2017-06-06: 4 mg via INTRAVENOUS

## 2017-06-06 MED ORDER — ASPIRIN EC 325 MG PO TBEC
325.0000 mg | DELAYED_RELEASE_TABLET | Freq: Every day | ORAL | Status: DC
  Administered 2017-06-06 – 2017-06-10 (×4): 325 mg via ORAL
  Filled 2017-06-06 (×4): qty 1

## 2017-06-06 MED ORDER — POLYETHYLENE GLYCOL 3350 17 G PO PACK: 17.0000 g | PACK | Freq: Every day | ORAL | Status: DC | PRN

## 2017-06-06 MED ORDER — SUCROFERRIC OXYHYDROXIDE 500 MG PO CHEW
1500.0000 mg | CHEWABLE_TABLET | Freq: Three times a day (TID) | ORAL | Status: DC
  Administered 2017-06-06 – 2017-06-10 (×10): 1500 mg via ORAL
  Filled 2017-06-06 (×13): qty 3

## 2017-06-06 MED ORDER — OXYCODONE HCL 5 MG PO TABS: 5.0000 mg | ORAL_TABLET | Freq: Once | ORAL | Status: DC | PRN

## 2017-06-06 MED ORDER — PROMETHAZINE HCL 25 MG/ML IJ SOLN: 6.2500 mg | INTRAMUSCULAR | Status: DC | PRN

## 2017-06-06 MED ORDER — ONDANSETRON HCL 4 MG PO TABS: 4.0000 mg | ORAL_TABLET | Freq: Four times a day (QID) | ORAL | Status: DC | PRN

## 2017-06-06 MED ORDER — MAGNESIUM CITRATE PO SOLN: 1.0000 | Freq: Once | ORAL | Status: DC | PRN

## 2017-06-06 MED ORDER — SODIUM CHLORIDE 0.9 % IV SOLN
INTRAVENOUS | Status: DC
  Administered 2017-06-06: 16:00:00 via INTRAVENOUS

## 2017-06-06 MED ORDER — CEFAZOLIN SODIUM-DEXTROSE 2-4 GM/100ML-% IV SOLN
INTRAVENOUS | Status: AC
  Filled 2017-06-06: qty 100

## 2017-06-06 MED ORDER — CEFAZOLIN SODIUM-DEXTROSE 2-4 GM/100ML-% IV SOLN
2.0000 g | INTRAVENOUS | Status: AC
  Administered 2017-06-06: 2 g via INTRAVENOUS

## 2017-06-06 MED ORDER — INSULIN ASPART 100 UNIT/ML ~~LOC~~ SOLN
SUBCUTANEOUS | Status: AC
  Administered 2017-06-06: 5 [IU] via SUBCUTANEOUS
  Filled 2017-06-06: qty 1

## 2017-06-06 MED ORDER — CALCIUM ACETATE (PHOS BINDER) 667 MG PO CAPS
667.0000 mg | ORAL_CAPSULE | Freq: Three times a day (TID) | ORAL | Status: DC
  Administered 2017-06-06 – 2017-06-10 (×10): 667 mg via ORAL
  Filled 2017-06-06 (×10): qty 1

## 2017-06-06 MED ORDER — INSULIN GLARGINE 100 UNIT/ML ~~LOC~~ SOLN
20.0000 [IU] | Freq: Every day | SUBCUTANEOUS | Status: DC
  Administered 2017-06-06: 20 [IU] via SUBCUTANEOUS
  Filled 2017-06-06: qty 0.2

## 2017-06-06 MED ORDER — INSULIN ASPART 100 UNIT/ML ~~LOC~~ SOLN
0.0000 [IU] | Freq: Three times a day (TID) | SUBCUTANEOUS | Status: DC
  Administered 2017-06-06: 5 [IU] via SUBCUTANEOUS
  Administered 2017-06-06: 12 [IU] via SUBCUTANEOUS
  Administered 2017-06-07 (×2): 9 [IU] via SUBCUTANEOUS
  Administered 2017-06-07: 4 [IU] via SUBCUTANEOUS
  Administered 2017-06-08: 1 [IU] via SUBCUTANEOUS
  Administered 2017-06-08 (×2): 3 [IU] via SUBCUTANEOUS
  Administered 2017-06-09: 5 [IU] via SUBCUTANEOUS
  Administered 2017-06-10: 2 [IU] via SUBCUTANEOUS
  Administered 2017-06-10: 3 [IU] via SUBCUTANEOUS

## 2017-06-06 MED ORDER — LISINOPRIL 20 MG PO TABS
20.0000 mg | ORAL_TABLET | Freq: Every day | ORAL | Status: DC
  Administered 2017-06-07 – 2017-06-09 (×3): 20 mg via ORAL
  Filled 2017-06-06 (×4): qty 1

## 2017-06-06 MED ORDER — METOCLOPRAMIDE HCL 5 MG PO TABS: 5.0000 mg | ORAL_TABLET | Freq: Three times a day (TID) | ORAL | Status: DC | PRN

## 2017-06-06 MED ORDER — ACETAMINOPHEN 325 MG PO TABS
325.0000 mg | ORAL_TABLET | Freq: Four times a day (QID) | ORAL | Status: DC | PRN
  Administered 2017-06-08 – 2017-06-09 (×2): 650 mg via ORAL
  Filled 2017-06-06: qty 2

## 2017-06-06 MED ORDER — METHOCARBAMOL 1000 MG/10ML IJ SOLN
500.0000 mg | Freq: Four times a day (QID) | INTRAVENOUS | Status: DC | PRN
  Filled 2017-06-06: qty 5

## 2017-06-06 MED ORDER — METOCLOPRAMIDE HCL 5 MG/ML IJ SOLN: 5.0000 mg | Freq: Three times a day (TID) | INTRAMUSCULAR | Status: DC | PRN

## 2017-06-06 MED ORDER — CHLORHEXIDINE GLUCONATE 4 % EX LIQD: 60.0000 mL | Freq: Once | CUTANEOUS | Status: DC

## 2017-06-06 MED ORDER — MIDAZOLAM HCL 2 MG/2ML IJ SOLN
INTRAMUSCULAR | Status: AC
  Administered 2017-06-06: 2 mg via INTRAVENOUS
  Filled 2017-06-06: qty 2

## 2017-06-06 MED ORDER — MIDAZOLAM HCL 2 MG/2ML IJ SOLN
2.0000 mg | Freq: Once | INTRAMUSCULAR | Status: AC
  Administered 2017-06-06: 2 mg via INTRAVENOUS

## 2017-06-06 MED ORDER — BISACODYL 10 MG RE SUPP: 10.0000 mg | Freq: Every day | RECTAL | Status: DC | PRN

## 2017-06-06 MED ORDER — HYDROMORPHONE HCL 2 MG/ML IJ SOLN: 0.2500 mg | INTRAMUSCULAR | Status: DC | PRN

## 2017-06-06 MED ORDER — OXYCODONE HCL 5 MG PO TABS
5.0000 mg | ORAL_TABLET | ORAL | Status: DC | PRN
  Administered 2017-06-06 – 2017-06-09 (×3): 10 mg via ORAL
  Filled 2017-06-06 (×5): qty 2

## 2017-06-06 MED ORDER — CARVEDILOL PHOSPHATE ER 10 MG PO CP24: 5.0000 mg | ORAL_CAPSULE | Freq: Two times a day (BID) | ORAL | Status: DC

## 2017-06-06 MED ORDER — OXYCODONE HCL 5 MG PO TABS
10.0000 mg | ORAL_TABLET | ORAL | Status: DC | PRN
  Administered 2017-06-07: 10 mg via ORAL
  Administered 2017-06-08 (×5): 15 mg via ORAL
  Administered 2017-06-09: 10 mg via ORAL
  Filled 2017-06-06 (×6): qty 3

## 2017-06-06 MED ORDER — FENTANYL CITRATE (PF) 100 MCG/2ML IJ SOLN
INTRAMUSCULAR | Status: AC
  Administered 2017-06-06: 100 ug via INTRAVENOUS
  Filled 2017-06-06: qty 2

## 2017-06-06 MED ORDER — INSULIN ASPART 100 UNIT/ML ~~LOC~~ SOLN
15.0000 [IU] | Freq: Once | SUBCUTANEOUS | Status: AC
  Administered 2017-06-06: 15 [IU] via SUBCUTANEOUS

## 2017-06-06 MED ORDER — CEFAZOLIN SODIUM-DEXTROSE 1-4 GM/50ML-% IV SOLN: 1.0000 g | Freq: Two times a day (BID) | INTRAVENOUS | Status: DC

## 2017-06-06 MED ORDER — METHOCARBAMOL 500 MG PO TABS
500.0000 mg | ORAL_TABLET | Freq: Four times a day (QID) | ORAL | Status: DC | PRN
  Administered 2017-06-06 – 2017-06-09 (×5): 500 mg via ORAL
  Filled 2017-06-06 (×5): qty 1

## 2017-06-06 MED ORDER — CARVEDILOL 3.125 MG PO TABS
3.1250 mg | ORAL_TABLET | Freq: Two times a day (BID) | ORAL | Status: DC
  Administered 2017-06-06 – 2017-06-10 (×7): 3.125 mg via ORAL
  Filled 2017-06-06 (×7): qty 1

## 2017-06-06 MED ORDER — GABAPENTIN 300 MG PO CAPS
300.0000 mg | ORAL_CAPSULE | Freq: Three times a day (TID) | ORAL | Status: DC
  Administered 2017-06-06 – 2017-06-10 (×11): 300 mg via ORAL
  Filled 2017-06-06 (×11): qty 1

## 2017-06-06 MED ORDER — ROPIVACAINE HCL 5 MG/ML IJ SOLN
INTRAMUSCULAR | Status: DC | PRN
  Administered 2017-06-06: 30 mL via PERINEURAL

## 2017-06-06 MED ORDER — OXYCODONE HCL 5 MG/5ML PO SOLN: 5.0000 mg | Freq: Once | ORAL | Status: DC | PRN

## 2017-06-06 MED ORDER — 0.9 % SODIUM CHLORIDE (POUR BTL) OPTIME
TOPICAL | Status: DC | PRN
  Administered 2017-06-06: 1000 mL

## 2017-06-06 MED ORDER — ONDANSETRON HCL 4 MG/2ML IJ SOLN
INTRAMUSCULAR | Status: DC | PRN
  Administered 2017-06-06: 4 mg via INTRAVENOUS

## 2017-06-06 MED ORDER — PROPOFOL 500 MG/50ML IV EMUL
INTRAVENOUS | Status: DC | PRN
  Administered 2017-06-06: 75 ug/kg/min via INTRAVENOUS

## 2017-06-06 MED ORDER — FENTANYL CITRATE (PF) 250 MCG/5ML IJ SOLN
INTRAMUSCULAR | Status: AC
  Filled 2017-06-06: qty 5

## 2017-06-06 SURGICAL SUPPLY — 33 items
BLADE SAW RECIP 87.9 MT (BLADE) ×3 IMPLANT
BLADE SURG 21 STRL SS (BLADE) ×3 IMPLANT
BNDG COHESIVE 6X5 TAN STRL LF (GAUZE/BANDAGES/DRESSINGS) ×6 IMPLANT
BNDG GAUZE ELAST 4 BULKY (GAUZE/BANDAGES/DRESSINGS) ×6 IMPLANT
COVER SURGICAL LIGHT HANDLE (MISCELLANEOUS) ×3 IMPLANT
CUFF TOURNIQUET SINGLE 34IN LL (TOURNIQUET CUFF) IMPLANT
CUFF TOURNIQUET SINGLE 44IN (TOURNIQUET CUFF) IMPLANT
DRAPE INCISE IOBAN 66X45 STRL (DRAPES) IMPLANT
DRAPE U-SHAPE 47X51 STRL (DRAPES) ×3 IMPLANT
DRESSING PREVENA PLUS CUSTOM (GAUZE/BANDAGES/DRESSINGS) ×1 IMPLANT
DRSG PREVENA PLUS CUSTOM (GAUZE/BANDAGES/DRESSINGS) ×3
ELECT REM PT RETURN 9FT ADLT (ELECTROSURGICAL) ×3
ELECTRODE REM PT RTRN 9FT ADLT (ELECTROSURGICAL) ×1 IMPLANT
GLOVE BIOGEL PI IND STRL 9 (GLOVE) ×1 IMPLANT
GLOVE BIOGEL PI INDICATOR 9 (GLOVE) ×2
GLOVE SURG ORTHO 9.0 STRL STRW (GLOVE) ×3 IMPLANT
GOWN STRL REUS W/ TWL XL LVL3 (GOWN DISPOSABLE) ×2 IMPLANT
GOWN STRL REUS W/TWL XL LVL3 (GOWN DISPOSABLE) ×4
KIT BASIN OR (CUSTOM PROCEDURE TRAY) ×3 IMPLANT
KIT PREVENA INCISION MGT20CM45 (CANNISTER) ×3 IMPLANT
KIT TURNOVER KIT B (KITS) ×3 IMPLANT
MANIFOLD NEPTUNE II (INSTRUMENTS) ×3 IMPLANT
NS IRRIG 1000ML POUR BTL (IV SOLUTION) ×3 IMPLANT
PACK ORTHO EXTREMITY (CUSTOM PROCEDURE TRAY) ×3 IMPLANT
PAD ARMBOARD 7.5X6 YLW CONV (MISCELLANEOUS) ×3 IMPLANT
SPONGE LAP 18X18 X RAY DECT (DISPOSABLE) IMPLANT
STAPLER VISISTAT 35W (STAPLE) IMPLANT
STOCKINETTE IMPERVIOUS LG (DRAPES) ×3 IMPLANT
SUT SILK 2 0 (SUTURE) ×4
SUT SILK 2-0 18XBRD TIE 12 (SUTURE) ×2 IMPLANT
SUT VIC AB 1 CTX 27 (SUTURE) IMPLANT
TOWEL OR 17X26 10 PK STRL BLUE (TOWEL DISPOSABLE) ×3 IMPLANT
WND VAC CANISTER 500ML (MISCELLANEOUS) ×3 IMPLANT

## 2017-06-06 NOTE — Progress Notes (Signed)
Patient's blood sugar 401 before dinner. Rechecked and was 418. Notified Dr. Sharol Given. Patient to get 12 units regular insulin now, and lantus at bedtime. Will continue to monitor.

## 2017-06-06 NOTE — Anesthesia Postprocedure Evaluation (Signed)
Anesthesia Post Note  Patient: Lance [name]  Procedure(s) Performed: RIGHT BELOW KNEE AMPUTATION (Right )     Patient location during evaluation: PACU Anesthesia Type: General Level of consciousness: awake and alert Pain management: pain level controlled Vital Signs Assessment: post-procedure vital signs reviewed and stable Respiratory status: spontaneous breathing, nonlabored ventilation and respiratory function stable Cardiovascular status: blood pressure returned to baseline and stable Postop Assessment: no apparent nausea or vomiting Anesthetic complications: no    Last Vitals:  Vitals:   06/06/17 1210 06/06/17 1211  BP:  109/62  Pulse:  76  Resp:  13  Temp: (!) 36.2 C   SpO2:  94%    Last Pain:  Vitals:   06/06/17 1210  PainSc: 0-No pain                 Lynda Rainwater

## 2017-06-06 NOTE — Anesthesia Procedure Notes (Signed)
Procedure Name: LMA Insertion Date/Time: 06/06/2017 10:43 AM Performed by: Izora Gala, CRNA Pre-anesthesia Checklist: Patient identified, Emergency Drugs available, Suction available and Patient being monitored Patient Re-evaluated:Patient Re-evaluated prior to induction Oxygen Delivery Method: Circle system utilized Preoxygenation: Pre-oxygenation with 100% oxygen Induction Type: IV induction Ventilation: Mask ventilation without difficulty LMA: LMA inserted LMA Size: 4.0 Placement Confirmation: positive ETCO2 Tube secured with: Tape Dental Injury: Teeth and Oropharynx as per pre-operative assessment

## 2017-06-06 NOTE — Consult Note (Signed)
[facility] Renal Consultation Note    Indication for Consultation:  Management of ESRD/hemodialysis; anemia, hypertension/volume and secondary hyperparathyroidism  HPI: Lance [name] is a 47 y.o. male with ESRD on HD (MWF [place]). ESRD 2/2 diabetic nephropathy. HD started 11/2014.  PMH also significant for  DM Type 1, diabetic retinopathy/neuropathy, HTN, EF 35% 01/2017, S/p [facility] 03/2017 with calcified 3-v CAD.  He is admitted with R foot gangrene and underwent right transtibial amputation with Dr. Sharol Given this am.  Seen in room after procedure. Feels comfortable, pain controlled. No c/os.   Usual dialysis schedule MWF, had extra dialysis session on Thursday in anticipation of surgery.  Fever/chills noted at dialysis unit on Wednesday - IV Fortaz/Vanc were started and blood cultures were collected. Afebrile this am. Na 131, K 3.8, WBC 25, Glucose 280, Hgb 11.5  Past Medical History:  Diagnosis Date  . Aortic stenosis    Very mild in 05/2007  . Arthritis    "right shoulder; right knee; feel like I've got it all over" (11/28/2015)  . CHF (congestive heart failure) ([place])   . Degenerative joint disease    Left TKA  . Diastolic heart failure (HCC)    LVEF 65-70% with grade 2 diastolic dysfunction  . ESRD (end stage renal disease) on dialysis ([place])    "MWF; [facility] [address]" (11/28/2015)  . Essential hypertension, benign 2000   LVH  . GERD (gastroesophageal reflux disease)   . HCAP (healthcare-associated pneumonia) 11/28/2015  . Hyperlipidemia 2000  . Loculated pleural effusion 11/28/2015   [name] 11/28/2015  . Pneumonia 12/2013; 05/2014  . PSVT (paroxysmal supraventricular tachycardia) (HCC)    Nonsustained; asymptomatic; diagnosed by event recorder in 2006  . Tobacco abuse, in remission    20 pack years; discontinued in 1985; bronchitic changes on chest x-ray and 2009  . Type 1 diabetes mellitus ([place]) 1990   Past Surgical History:  Procedure  Laterality Date  . AV FISTULA PLACEMENT Right 06/23/2014   Procedure: ARTERIOVENOUS (AV) FISTULA CREATION;  Surgeon: Angelia Mould, MD;  Location: [facility];  Service: Vascular;  Laterality: Right;  . CARDIAC CATHETERIZATION     [facility]- evaluation for Kidney/ pancreas transplant  . EYE SURGERY Bilateral    "for glaucoma"  . INGUINAL HERNIA REPAIR Right    As a child  . LOWER EXTREMITY ANGIOGRAPHY N/A 05/27/2017   Procedure: LOWER EXTREMITY ANGIOGRAPHY;  Surgeon: [name], MD;  Location: [facility];  Service: Cardiovascular;  Laterality: N/A;  . WISDOM TOOTH EXTRACTION  1987   Family History  Problem Relation Age of Onset  . Diabetes Father   . Hypertension Father   . Diabetes Mother   . Hypertension Mother    Social History:  reports that he has never smoked. He has never used smokeless tobacco. He reports that he drinks alcohol. He reports that he does not use drugs. Allergies  Allergen Reactions  . Atorvastatin Other (See Comments)    Myalgias   . Minoxidil Other (See Comments)    Fluid retention    Prior to Admission medications   Medication Sig Start Date End Date Taking? Authorizing Provider  calcium acetate (PHOSLO) 667 MG capsule Take 1 capsule by mouth 3 (three) times daily. 05/07/17  Yes [provider]  carvedilol (COREG CR) 10 MG 24 hr capsule Take 5 mg by mouth 2 (two) times daily.    Yes Riccardo Dubin, MD  Continuous Blood Gluc Sensor ([place]) [place]  Use one sensor every 10 days. 02/27/17  Yes Nida, Marella Chimes, MD  HUMALOG KWIKPEN 100 UNIT/ML KiwkPen Inject 12-18 Units into the skin 3 (three) times daily before meals. 12/22/16  Yes [provider]  HYDROcodone-acetaminophen (NORCO/VICODIN) 5-325 MG tablet Take 1 tablet by mouth every 6 (six) hours as needed for moderate pain. 06/02/17  Yes [name], MD  LANTUS 100 UNIT/ML injection Inject 30 Units into the skin at bedtime. 02/02/17  Yes [provider]  lisinopril (PRINIVIL,ZESTRIL) 20 MG tablet Take 20 mg by mouth at bedtime.  11/29/16  Yes [provider]  ranitidine (ZANTAC) 75 MG tablet Take 75 mg by mouth daily as needed for heartburn.   Yes [provider]  sucroferric oxyhydroxide (VELPHORO) 500 MG chewable tablet Chew 3 tablets by mouth 3 (three) times daily. 11/06/16  Yes [provider]   Current Facility-Administered Medications  Medication Dose Route Frequency Provider Last Rate Last Dose  . 0.9 %  sodium chloride infusion   Intravenous Continuous Lynda Rainwater, MD 10 mL/hr at 06/06/17 [phone]    . aspirin EC tablet 325 mg  325 mg Oral Daily [name], MD      . bisacodyl (DULCOLAX) suppository 10 mg  10 mg Rectal Daily PRN [name], MD      . calcium acetate (PHOSLO) capsule 667 mg  667 mg Oral TID [name], MD      . carvedilol (COREG CR) 24 hr capsule 10 mg  10 mg Oral BID [name], MD      . ceFAZolin (ANCEF) IVPB 1 g/50 mL premix  1 g Intravenous Q12H [name], MD      . chlorhexidine (HIBICLENS) 4 % liquid 4 application  60 mL Topical Once [name], MD      . gabapentin (NEURONTIN) capsule 300 mg  300 mg Oral TID [name], MD      . HYDROmorphone (DILAUDID) injection 0.3-0.5 mg  0.3-0.5 mg Intravenous Q5 min PRN Lynda Rainwater, MD      . insulin aspart (novoLOG) injection 0-9 Units  0-9 Units Subcutaneous TID WC [name], MD   5 Units at 06/06/17 1139  . insulin aspart (novoLOG) injection 3 Units  3 Units Subcutaneous TID WC [name], MD      . methocarbamol (ROBAXIN) tablet 500 mg  500 mg Oral Q6H PRN [name], MD   500 mg at 06/06/17 1200   Or  . methocarbamol (ROBAXIN) 500 mg in dextrose 5 % 50 mL IVPB  500 mg Intravenous Q6H PRN [name], MD      . oxyCODONE (Oxy IR/ROXICODONE) immediate release tablet 5 mg  5 mg Oral Once PRN Lynda Rainwater, MD       Or  . oxyCODONE (ROXICODONE) 5 MG/5ML solution 5 mg  5  mg Oral Once PRN Lynda Rainwater, MD      . promethazine (PHENERGAN) injection 6.25-12.5 mg  6.25-12.5 mg Intravenous Q15 min PRN Lynda Rainwater, MD         ROS: As per HPI otherwise negative.  Physical Exam: Vitals:   06/06/17 1241 06/06/17 1256 06/06/17 1311 06/06/17 1315  BP: (!) 112/59 113/72 (!) 108/59   Pulse: 76 75 75 72  Resp: 12 14 14 13   Temp:      SpO2: 95% 98% 96% 96%  Weight:      Height:  General: WDWN male NAD  Head: NCAT sclera not icteric Neck: Supple. No JVD No masses Lungs: CTAB  Heart: RRR with S1 S2 Abdomen: soft NT + BS Lower extremities: R BKA wound VAC in place  Neuro: A & O  X 3. Moves all extremities spontaneously. Psych:  Responds to questions appropriately with a normal affect. Dialysis Access: LUE AVF +thrill   Labs: Basic Metabolic Panel: Recent Labs  Lab 06/06/17 0812  NA 131*  K 3.8  CL 90*  CO2 21*  GLUCOSE 280*  BUN 23*  CREATININE 7.08*  CALCIUM 8.6*   Liver Function Tests: No results for input(s): AST, ALT, ALKPHOS, BILITOT, PROT, ALBUMIN in the last 168 hours. No results for input(s): LIPASE, AMYLASE in the last 168 hours. No results for input(s): AMMONIA in the last 168 hours. CBC: Recent Labs  Lab 06/06/17 0812  WBC 25.0*  HGB 11.5*  HCT 34.0*  MCV 100.6*  PLT 489*   Cardiac Enzymes: No results for input(s): CKTOTAL, CKMB, CKMBINDEX, TROPONINI in the last 168 hours. CBG: Recent Labs  Lab 06/06/17 0751 06/06/17 1114  GLUCAP 260* 283*   Iron Studies: No results for input(s): IRON, TIBC, TRANSFERRIN, FERRITIN in the last 72 hours. Studies/Results: No results found.  Dialysis Orders:  [facility] MWF 4h 180NRe 500/800 EDW 105.5kg 2K/3Ca Profile 4 R AVF Hep 10000 U Parsabiv 5mg  IV TIW Calcitriol 0.28mcg PO TIW    Assessment/Plan: 1. 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 Sharol Given   2. ESRD -  MWF usual schedule. Had extra HD yesterday before surgery. No urgent HD needs today. Will continue to follow.   3. Hypertension/volume  - BP controlled/volume stable. Cont home meds.   4. Anemia  - Hgb 11.5 Follow trend post-op  5. Metabolic bone disease -  Continue calcitriol/Velphoro 3 tabs tid, Ca acetate 1 tab tid. Parsabiv not on hospital formulary  6. Nutrition - Renal diet/vitamins 7. DM Type I - insulin per primary  Lynnda Child PA-C [place] Pager [phone] 06/06/2017, 2:23 PM

## 2017-06-06 NOTE — H&P (Signed)
Lance [name] is an 47 y.o. male.   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  Patient is status post vascular studies with Dr. Trula Slade.  Patient's studies do not show any revascularization options.  Patient also states is been having some medial thigh and medial leg pain for the past several weeks that preceded his vascular study.    Past Medical History:  Diagnosis Date  . Aortic stenosis    Very mild in 05/2007  . Arthritis    "right shoulder; right knee; feel like I've got it all over" (11/28/2015)  . CHF (congestive heart failure) ([place])   . Degenerative joint disease    Left TKA  . Diastolic heart failure (HCC)    LVEF 65-70% with grade 2 diastolic dysfunction  . ESRD (end stage renal disease) on dialysis ([place])    "MWF; [facility] on [address]" (11/28/2015)  . Essential hypertension, benign 2000   LVH  . GERD (gastroesophageal reflux disease)   . HCAP (healthcare-associated pneumonia) 11/28/2015  . Hyperlipidemia 2000  . Loculated pleural effusion 11/28/2015   [name] 11/28/2015  . Pneumonia 12/2013; 05/2014  . PSVT (paroxysmal supraventricular tachycardia) (HCC)    Nonsustained; asymptomatic; diagnosed by event recorder in 2006  . Tobacco abuse, in remission    20 pack years; discontinued in 1985; bronchitic changes on chest x-ray and 2009  . Type 1 diabetes mellitus ([place]) 1990    Past Surgical History:  Procedure Laterality Date  . AV FISTULA PLACEMENT Right 06/23/2014   Procedure: ARTERIOVENOUS (AV) FISTULA CREATION;  Surgeon: Angelia Mould, MD;  Location: [facility];  Service: Vascular;  Laterality: Right;  . CARDIAC CATHETERIZATION     [facility]- evaluation for Kidney/ pancreas transplant  . EYE SURGERY Bilateral    "for glaucoma"  . INGUINAL HERNIA REPAIR Right    As a child  . LOWER EXTREMITY ANGIOGRAPHY N/A  05/27/2017   Procedure: LOWER EXTREMITY ANGIOGRAPHY;  Surgeon: [name], MD;  Location: [facility];  Service: Cardiovascular;  Laterality: N/A;  . WISDOM TOOTH EXTRACTION  1987    Family History  Problem Relation Age of Onset  . Diabetes Father   . Hypertension Father   . Diabetes Mother   . Hypertension Mother    Social History:  reports that he has never smoked. He has never used smokeless tobacco. He reports that he drinks alcohol. He reports that he does not use drugs.  Allergies:  Allergies  Allergen Reactions  . Atorvastatin Other (See Comments)    Myalgias   . Minoxidil Other (See Comments)    Fluid retention     No medications prior to admission.    No results found for this or any previous visit (from the past 48 hour(s)). No results found.  Review of Systems  All other systems reviewed and are negative.   There were no vitals taken for this visit. Physical Exam  Patient is alert, oriented, no adenopathy, well-dressed, normal affect, normal respiratory effort. Examination patient has an antalgic gait he does not have palpable pulses in the right foot.  Review of the vascular study shows no options for revascularization he has good circulation down to the ankle.  Patient is also tender to Palpation over the medial calf and medial thigh on the right there is no swelling but the tenderness is concerning for possible DVT. Ultrasound was  negative for DVT.   Assessment/Plan 1. Gangrene of toe of right foot ([place])   2. Right calf pain     Plan: We will set him up for an ultrasound to rule out DVT in the right calf or thigh.  Discussed that with the circulation studies patient does not have any foot salvage options and we would need to proceed with a transtibial amputation.  Patient has dry gangrene of the toes there is no ascending cellulitis he has no pain we will set him up for transtibial amputation next week.  Will need to coordinate this with his  dialysis.     [name], MD 06/06/2017, 6:36 AM

## 2017-06-06 NOTE — Anesthesia Procedure Notes (Addendum)
Anesthesia Regional Block: Popliteal block   Pre-Anesthetic Checklist: ,, timeout performed, Correct Patient, Correct Site, Correct Laterality, Correct Procedure, Correct Position, site marked, Risks and benefits discussed,  Surgical consent,  Pre-op evaluation,  At surgeon's request and post-op pain management  Laterality: Right  Prep: chloraprep       Needles:  Injection technique: Single-shot  Needle Type: Stimiplex     Needle Length: 9cm  Needle Gauge: 21     Additional Needles:   Procedures:,,,, ultrasound used (permanent image in chart),,,,  Narrative:  Start time: 06/06/2017 9:30 AM End time: 06/06/2017 9:35 AM Injection made incrementally with aspirations every 5 mL.  Performed by: Personally  Anesthesiologist: Lynda Rainwater, MD       0.5% Ropivacaine 8ml for Right femoral block

## 2017-06-06 NOTE — Progress Notes (Signed)
Pt placed on hold - no room assignment

## 2017-06-06 NOTE — Transfer of Care (Signed)
Immediate Anesthesia Transfer of Care Note  Patient: Lance [name]  Procedure(s) Performed: RIGHT BELOW KNEE AMPUTATION (Right )  Patient Location: PACU  Anesthesia Type:General  Level of Consciousness: awake, alert , oriented and patient cooperative  Airway & Oxygen Therapy: Patient Spontanous Breathing and Patient connected to nasal cannula oxygen  Post-op Assessment: Report given to RN and Post -op Vital signs reviewed and stable  Post vital signs: Reviewed and stable  Last Vitals:  Vitals Value Taken Time  BP 106/60 06/06/2017 11:11 AM  Temp    Pulse 77 06/06/2017 11:12 AM  Resp 15 06/06/2017 11:12 AM  SpO2 100 % 06/06/2017 11:12 AM  Vitals shown include unvalidated device data.  Last Pain:  Vitals:   06/06/17 0806  PainSc: 0-No pain      Patients Stated Pain Goal: 3 (19/41/74 0814)  Complications: No apparent anesthesia complications

## 2017-06-06 NOTE — Progress Notes (Signed)
Pt's CBG 467. MD was notified at 17. Per Dr. Lorin Mercy order- to administer 15 units of Novolog, and scheduled Lantus. Nursing will continue to monitor.

## 2017-06-06 NOTE — Anesthesia Preprocedure Evaluation (Addendum)
Anesthesia Evaluation  Patient identified by MRN, date of birth, ID band Patient awake    Reviewed: Allergy & Precautions, H&P , NPO status , Patient's Chart, lab work & pertinent test results, reviewed documented beta blocker date and time   Airway Mallampati: II  TM Distance: >3 FB Neck ROM: Full    Dental no notable dental hx. (+) Teeth Intact, Dental Advisory Given   Pulmonary neg pulmonary ROS,    Pulmonary exam normal breath sounds clear to auscultation       Cardiovascular hypertension, Pt. on medications and Pt. on home beta blockers  Rhythm:Regular Rate:Normal     Neuro/Psych negative neurological ROS  negative psych ROS   GI/Hepatic negative GI ROS, Neg liver ROS,   Endo/Other  diabetes, Poorly Controlled, Type 1, Insulin Dependent  Renal/GU CRFRenal disease  negative genitourinary   Musculoskeletal  (+) Arthritis , Osteoarthritis,    Abdominal   Peds  Hematology negative hematology ROS (+)   Anesthesia Other Findings   Reproductive/Obstetrics negative OB ROS                             Anesthesia Physical  Anesthesia Plan  ASA: IV  Anesthesia Plan: Regional and General   Post-op Pain Management:  Regional for Post-op pain   Induction: Intravenous  PONV Risk Score and Plan: 1 and Ondansetron  Airway Management Planned: LMA  Additional Equipment:   Intra-op Plan:   Post-operative Plan:   Informed Consent: I have reviewed the patients History and Physical, chart, labs and discussed the procedure including the risks, benefits and alternatives for the proposed anesthesia with the patient or authorized representative who has indicated his/her understanding and acceptance.   Dental advisory given  Plan Discussed with: CRNA  Anesthesia Plan Comments:        Anesthesia Quick Evaluation

## 2017-06-06 NOTE — Op Note (Signed)
   Date of Surgery: 06/06/2017  INDICATIONS: [name] is a 47 y.o.-year-old male who presents with abscess and osteomyelitis of right foot .  PREOPERATIVE DIAGNOSIS: abscess and osteomyelitis right foot  POSTOPERATIVE DIAGNOSIS: Same.  PROCEDURE: Transtibial amputation Application of Prevena wound VAC  SURGEON: Sharol Given, M.D.  ANESTHESIA:  general  IV FLUIDS AND URINE: See anesthesia.  ESTIMATED BLOOD LOSS: min mL.  COMPLICATIONS: None.  DESCRIPTION OF PROCEDURE: The patient was brought to the operating room and underwent a general anesthetic. After adequate levels of anesthesia were obtained patient's lower extremity was prepped using DuraPrep draped into a sterile field. A timeout was called. The foot was draped out of the sterile field with impervious stockinette. A transverse incision was made 11 cm distal to the tibial tubercle. This curved proximally and a large posterior flap was created. The tibia was transected 1 cm proximal to the skin incision. The fibula was transected just proximal to the tibial incision. The tibia was beveled anteriorly. A large posterior flap was created. The sciatic nerve was pulled cut and allowed to retract. The vascular bundles were suture ligated with 2-0 silk. The deep and superficial fascial layers were closed using #1 Vicryl. The skin was closed using staples and 2-0 nylon. The wound was covered with a Prevena wound VAC. There was a good suction fit. A prosthetic shrinker was applied. Patient was extubated taken to the PACU in stable condition.   DISCHARGE PLANNING:  Antibiotic duration:24 hours  Weightbearing: NWB right  Pain medication: ordered  Dressing care/ Wound TRR:[id number] VAC for 1 week,   Discharge to: home vs SNF  Follow-up: In the office 1 week post operative.  [name], MD [place] 11:14 AM

## 2017-06-07 ENCOUNTER — Encounter (HOSPITAL_COMMUNITY): Payer: Self-pay | Admitting: Orthopedic Surgery

## 2017-06-07 LAB — GLUCOSE, CAPILLARY
GLUCOSE-CAPILLARY: 409 mg/dL — AB (ref 65–99)
GLUCOSE-CAPILLARY: 395 mg/dL — AB (ref 65–99)
Glucose-Capillary: 353 mg/dL — ABNORMAL HIGH (ref 65–99)
Glucose-Capillary: 131 mg/dL — ABNORMAL HIGH (ref 65–99)
Glucose-Capillary: 172 mg/dL — ABNORMAL HIGH (ref 65–99)

## 2017-06-07 LAB — CBC
HCT: 32.2 % — ABNORMAL LOW (ref 39.0–52.0)
Hemoglobin: 10.9 g/dL — ABNORMAL LOW (ref 13.0–17.0)
MCH: 33.3 pg (ref 26.0–34.0)
MCHC: 33.9 g/dL (ref 30.0–36.0)
MCV: 98.5 fL (ref 78.0–100.0)
Platelets: 561 10*3/uL — ABNORMAL HIGH (ref 150–400)
RBC: 3.27 MIL/uL — ABNORMAL LOW (ref 4.22–5.81)
RDW: 14.8 % (ref 11.5–15.5)
WBC: 22.9 10*3/uL — ABNORMAL HIGH (ref 4.0–10.5)

## 2017-06-07 LAB — BASIC METABOLIC PANEL
Anion gap: 13 (ref 5–15)
BUN: 49 mg/dL — ABNORMAL HIGH (ref 6–20)
CO2: 27 mmol/L (ref 22–32)
Calcium: 7.8 mg/dL — ABNORMAL LOW (ref 8.9–10.3)
Chloride: 90 mmol/L — ABNORMAL LOW (ref 101–111)
Creatinine, Ser: 9.62 mg/dL — ABNORMAL HIGH (ref 0.61–1.24)
GFR calc Af Amer: 7 mL/min — ABNORMAL LOW (ref >60)
GFR calc non Af Amer: 6 mL/min — ABNORMAL LOW (ref >60)
Glucose, Bld: 354 mg/dL — ABNORMAL HIGH (ref 65–99)
Potassium: 3.7 mmol/L (ref 3.5–5.1)
Sodium: 130 mmol/L — ABNORMAL LOW (ref 135–145)

## 2017-06-07 MED ORDER — INSULIN GLARGINE 100 UNIT/ML ~~LOC~~ SOLN
10.0000 [IU] | Freq: Once | SUBCUTANEOUS | Status: AC
  Administered 2017-06-07: 10 [IU] via SUBCUTANEOUS
  Filled 2017-06-07: qty 0.1

## 2017-06-07 MED ORDER — INSULIN GLARGINE 100 UNIT/ML ~~LOC~~ SOLN
30.0000 [IU] | Freq: Every day | SUBCUTANEOUS | Status: DC
  Administered 2017-06-07: 30 [IU] via SUBCUTANEOUS
  Filled 2017-06-07 (×2): qty 0.3

## 2017-06-07 MED ORDER — INSULIN ASPART 100 UNIT/ML ~~LOC~~ SOLN
18.0000 [IU] | Freq: Once | SUBCUTANEOUS | Status: AC
  Administered 2017-06-07: 18 [IU] via SUBCUTANEOUS

## 2017-06-07 NOTE — Evaluation (Signed)
Physical Therapy Evaluation Patient Details Name: Lance [name] MRN: [id number] DOB: December 29, 1970 Today's Date: 06/07/2017   History of Present Illness  Pt is a 47 y/o male s/p R transtibial amputation. PMH including but not limited to CHF, ESRD and DM.  Clinical Impression  Pt presented supine in bed with HOB elevated, awake and willing to participate in therapy session. Prior to admission, pt reported that he was independent with all functional mobility and ADLs. Pt has good family support but once he goes home he would have approximately 2 hours during the day that he would be alone. Pt currently requires supervision for bed mobility, mod A for sit<>stand transfers with RW and was able to take a few lateral hops on L LE with mod A for stability. Pt is an excellent candidate for further intensive therapy services in CIR to maximize his independence with functional mobility prior to returning home with family support. PT will continue to follow acutely to progress mobility as tolerated.    Follow Up Recommendations CIR    Equipment Recommendations  Rolling walker with 5" wheels;3in1 (PT);Wheelchair (measurements PT);Wheelchair cushion (measurements PT)    Recommendations for Other Services Rehab consult     Precautions / Restrictions Precautions Precautions: Fall Precaution Comments: wound VAC Required Braces or Orthoses: Other Brace/Splint Other Brace/Splint: residual limb protector Restrictions Weight Bearing Restrictions: Yes RLE Weight Bearing: Non weight bearing      Mobility  Bed Mobility Overal bed mobility: Needs Assistance Bed Mobility: Supine to Sit;Sit to Supine     Supine to sit: Supervision Sit to supine: Supervision   General bed mobility comments: increased time and effort, supervision for safety  Transfers Overall transfer level: Needs assistance Equipment used: Rolling walker (2 wheeled) Transfers: Sit to/from Stand Sit to Stand: Mod assist          General transfer comment: increased time and effort, cueing for safe hand placement, slightly elevated bed, mod A to power into standing and for stability  Ambulation/Gait             General Gait Details: pt able to take 4 lateral hops on L LE at EOB with minimal foot clearance using RW with mod A for balance  Stairs            Wheelchair Mobility    Modified Rankin (Stroke Patients Only)       Balance Overall balance assessment: Needs assistance Sitting-balance support: Feet supported Sitting balance-Leahy Scale: Good     Standing balance support: During functional activity;Bilateral upper extremity supported Standing balance-Leahy Scale: Poor                               Pertinent Vitals/Pain Pain Assessment: No/denies pain    Home Living Family/patient expects to be discharged to:: Private residence Living Arrangements: Alone Available Help at Discharge: Family;Friend(s);Available PRN/intermittently;Other (Comment)(will only be alone for ~2 hrs) Type of Home: House Home Access: Stairs to enter;Other (comment)(working on getting a ramp) Entrance Stairs-Rails: Right;Left Entrance Stairs-Number of Steps: 5 Home Layout: Able to live on main level with bedroom/bathroom Home Equipment: Crutches      Prior Function Level of Independence: Independent               Hand Dominance        Extremity/Trunk Assessment   Upper Extremity Assessment Upper Extremity Assessment: Overall WFL for tasks assessed    Lower Extremity Assessment Lower Extremity Assessment: Overall  WFL for tasks assessed;RLE deficits/detail RLE Deficits / Details: pt with residual limb protector in place; MMT revealed 4/5 for hip flexion, hip extension, hip abduction and hip adduction RLE: Unable to fully assess due to immobilization    Cervical / Trunk Assessment Cervical / Trunk Assessment: Normal  Communication   Communication: No difficulties  Cognition  Arousal/Alertness: Lethargic Behavior During Therapy: WFL for tasks assessed/performed Overall Cognitive Status: Within Functional Limits for tasks assessed                                        General Comments General comments (skin integrity, edema, etc.): PT stressed importance of maintaining AROM at knee for future prosthesis    Exercises     Assessment/Plan    PT Assessment Patient needs continued PT services  PT Problem List Decreased activity tolerance;Decreased balance;Decreased mobility;Decreased coordination;Decreased knowledge of use of DME;Decreased safety awareness;Decreased knowledge of precautions       PT Treatment Interventions DME instruction;Gait training;Stair training;Functional mobility training;Therapeutic activities;Balance training;Neuromuscular re-education;Therapeutic exercise;Patient/family education    PT Goals (Current goals can be found in the Care Plan section)  Acute Rehab PT Goals Patient Stated Goal: return home, eventually get a prosthesis PT Goal Formulation: With patient Time For Goal Achievement: 06/21/17 Potential to Achieve Goals: Good    Frequency Min 5X/week   Barriers to discharge        Co-evaluation               AM-PAC PT "6 Clicks" Daily Activity  Outcome Measure Difficulty turning over in bed (including adjusting bedclothes, sheets and blankets)?: None Difficulty moving from lying on back to sitting on the side of the bed? : None Difficulty sitting down on and standing up from a chair with arms (e.g., wheelchair, bedside commode, etc,.)?: Unable Help needed moving to and from a bed to chair (including a wheelchair)?: A Lot Help needed walking in hospital room?: A Lot Help needed climbing 3-5 steps with a railing? : Total 6 Click Score: 14    End of Session Equipment Utilized During Treatment: Gait belt Activity Tolerance: Patient limited by fatigue;Patient limited by lethargy Patient left: in  bed;with call bell/phone within reach;with family/visitor present;with SCD's reapplied Nurse Communication: Mobility status PT Visit Diagnosis: Other abnormalities of gait and mobility (R26.89);Difficulty in walking, not elsewhere classified (R26.2)    Time: 8270-7867 PT Time Calculation (min) (ACUTE ONLY): 23 min   Charges:   PT Evaluation $PT Eval Moderate Complexity: 1 Mod PT Treatments $Therapeutic Activity: 8-22 mins   PT G Codes:        [place], PT, [place] 06/07/2017, 5:33 PM

## 2017-06-07 NOTE — Progress Notes (Signed)
[facility] Progress Note   Dialysis Orders: [facility] MWF 4h 180NRe 500/800 EDW 105.5kg 2K/3Ca Profile 4 R AVF Hep 10000 U Parsabiv 5mg  IV TIW Calcitriol 0.36mcg PO TIW    Assessment/Plan: 1. 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 Sharol Given   2. ESRD -  MWF usual schedule. Had HD [facility] this week.  Next HD due Monday. HAs not had any post op labs - plan check and if ok, defer HD until Monday 3. Hypertension/volume  - BP controlled/volume stable. Cont home meds.   4. Anemia  - Hgb 11.5 Follow trend post-op -check CBC today 5. Metabolic bone disease -  Continue calcitriol/Velphoro 3 tabs tid, Ca acetate 1 tab tid. Parsabiv not on hospital formulary  6. Nutrition - Renal diet/vitamins 7. DM Type I - insulin per primary - BS high - didn't get full Lantus dose last night per pt 8. Leukocytosis WBC 25 K - had fever and chills 4/24 at outpatient HD [facility] drawn - empiric Vanc and Fortaz given; afebrile here/ not on abtx - plan check CBC to trend WBC  Myriam Jacobson, PA-C [facility] [phone] 06/07/2017,10:40 AM  LOS: 1 day   Subjective:   Concerned about high BS. Possibly may go home Monday if mobilizing ok - to start home training home HD 5/6.  Objective Vitals:   06/06/17 1534 06/06/17 2028 06/07/17 0012 06/07/17 0521  BP: 108/60 115/64 119/63 113/67  Pulse: 70 70 63 62  Resp:      Temp: (!) 97.3 F (36.3 C) 98.6 F (37 C) (!) 97.5 F (36.4 C) 98.7 F (37.1 C)  TempSrc: Oral  Oral   SpO2: 99% 97% 98% 99%  Weight:      Height:       Physical Exam General: alert, NAD breathing easily Heart: RRR Lungs: grossly clear Abdomen: soft NT Extremities: right BKA in immobilizer no LE edema Dialysis Access: right AVF + bruit   Additional Objective Labs: Basic Metabolic Panel: Recent Labs  Lab 06/06/17 0812  NA 131*  K 3.8  CL 90*  CO2 21*  GLUCOSE 280*  BUN 23*  CREATININE 7.08*  CALCIUM 8.6*   Liver Function Tests: No results for input(s): AST,  ALT, ALKPHOS, BILITOT, PROT, ALBUMIN in the last 168 hours. No results for input(s): LIPASE, AMYLASE in the last 168 hours. CBC: Recent Labs  Lab 06/06/17 0812  WBC 25.0*  HGB 11.5*  HCT 34.0*  MCV 100.6*  PLT 489*   Blood Culture    Component Value Date/Time   SDES FLUID LEFT PLEURAL 11/30/2015 1426   SDES FLUID LEFT PLEURAL 11/30/2015 1426   SPECREQUEST BOTTLES DRAWN AEROBIC AND ANAEROBIC 10CC 11/30/2015 1426   SPECREQUEST NONE 11/30/2015 1426   CULT NO GROWTH 5 DAYS 11/30/2015 1426   REPTSTATUS 12/06/2015 FINAL 11/30/2015 1426   REPTSTATUS 11/30/2015 FINAL 11/30/2015 1426    Cardiac Enzymes: No results for input(s): CKTOTAL, CKMB, CKMBINDEX, TROPONINI in the last 168 hours. CBG: Recent Labs  Lab 06/06/17 2119 06/06/17 2245 06/06/17 2353 06/07/17 0635 06/07/17 0801  GLUCAP 454* 403* 397* 409* 395*   Iron Studies: No results for input(s): IRON, TIBC, TRANSFERRIN, FERRITIN in the last 72 hours. Lab Results  Component Value Date   INR 1.16 11/30/2015   Studies/Results: No results found. Medications: . sodium chloride 10 mL/hr at 06/06/17 1629  . methocarbamol (ROBAXIN)  IV     . aspirin EC  325 mg Oral Daily  . calcium acetate  667 mg  Oral TID WC  . carvedilol  3.125 mg Oral BID WC  . docusate sodium  100 mg Oral BID  . gabapentin  300 mg Oral TID  . insulin aspart  0-9 Units Subcutaneous TID WC  . insulin aspart  3 Units Subcutaneous TID WC  . insulin glargine  30 Units Subcutaneous QHS  . lisinopril  20 mg Oral QHS  . sucroferric oxyhydroxide  1,500 mg Oral TID WC

## 2017-06-07 NOTE — Progress Notes (Signed)
   Subjective: 1 Day Post-Op Procedure(s) (LRB): RIGHT BELOW KNEE AMPUTATION (Right) Patient reports pain as mild and moderate.    Objective: Vital signs in last 24 hours: Temp:  [97.5 F (36.4 C)-98.7 F (37.1 C)] 98.6 F (37 C) (04/27 1352) Pulse Rate:  [62-78] 78 (04/27 1352) Resp:  [18] 18 (04/27 1352) BP: (113-119)/(63-69) 118/69 (04/27 1352) SpO2:  [97 %-100 %] 100 % (04/27 1352)  Intake/Output from previous day: 04/26 0701 - 04/27 0700 In: 1617.2 [P.O.:1002; I.V.:515.2] Out: 150 [Blood:150] Intake/Output this shift: Total I/O In: 360 [P.O.:360] Out: -   Recent Labs    06/06/17 0812 06/07/17 1142  HGB 11.5* 10.9*   Recent Labs    06/06/17 0812 06/07/17 1142  WBC 25.0* 22.9*  RBC 3.38* 3.27*  HCT 34.0* 32.2*  PLT 489* 561*   Recent Labs    06/06/17 0812 06/07/17 1142  NA 131* 130*  K 3.8 3.7  CL 90* 90*  CO2 21* 27  BUN 23* 49*  CREATININE 7.08* 9.62*  GLUCOSE 280* 354*  CALCIUM 8.6* 7.8*   No results for input(s): LABPT, INR in the last 72 hours.  Dressing intact.  No results found.  Assessment/Plan: 1 Day Post-Op Procedure(s) (LRB): RIGHT BELOW KNEE AMPUTATION (Right) Up with therapy, home possibly Monday  Lance [name] 06/07/2017, 5:37 PM

## 2017-06-07 NOTE — Progress Notes (Signed)
Orthopedic Tech Progress Note Patient Details:  Lance [name] 02/21/1970 [id number]  Patient ID: [name], male   DOB: [birth year], 47 y.o.   MRN: [id number]   Hildred Priest 06/07/2017, 10:03 AM Called in hanger brace order; spoke with answering service

## 2017-06-07 NOTE — Progress Notes (Signed)
On call MD gave order for one time dose of Novolog 18 units for glucose of 409.

## 2017-06-07 NOTE — Progress Notes (Signed)
Rehab Admissions Coordinator Note:  Patient was screened by Cleatrice Burke for appropriateness for an Inpatient Acute Rehab Consult per PT recommendation..  At this time, we are recommending Inpatient Rehab consult as well as an OT order. Please place these orders if pt would like to be considered for admit.  Cleatrice Burke 06/07/2017, 7:32 PM  I can be reached at [phone].

## 2017-06-07 NOTE — Progress Notes (Signed)
Critical lab value this am.  Patient's cbg 409, called and left message for on call MD.  Waiting call back.

## 2017-06-07 NOTE — Progress Notes (Signed)
Pt's last CBG 403 and 397 despite 20 units of lantus and 15 units of Novolog. MD was called. Per Dr. Lorin Mercy- to administer extra 10 units of Lantus and recheck it in the morning.

## 2017-06-08 LAB — GLUCOSE, CAPILLARY
GLUCOSE-CAPILLARY: 219 mg/dL — AB (ref 65–99)
GLUCOSE-CAPILLARY: 130 mg/dL — AB (ref 65–99)
Glucose-Capillary: 242 mg/dL — ABNORMAL HIGH (ref 65–99)
Glucose-Capillary: 115 mg/dL — ABNORMAL HIGH (ref 65–99)

## 2017-06-08 MED ORDER — CALCITRIOL 0.25 MCG PO CAPS: 0.2500 ug | ORAL_CAPSULE | ORAL | Status: DC

## 2017-06-08 MED ORDER — INSULIN GLARGINE 100 UNIT/ML ~~LOC~~ SOLN
35.0000 [IU] | Freq: Every day | SUBCUTANEOUS | Status: DC
  Administered 2017-06-08 – 2017-06-09 (×2): 35 [IU] via SUBCUTANEOUS
  Filled 2017-06-08 (×3): qty 0.35

## 2017-06-08 NOTE — Progress Notes (Signed)
  [facility] Progress Note   Dialysis Orders: [facility] MWF 4h 180NRe 500/800 EDW 105.5kg 2K/3Ca Profile 4 R AVF Hep 10000 U Parsabiv 5mg  IV TIW Calcitriol 0.71mcg PO TIW  Assessment/Plan: 1. 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 Earney [employer] walker- being screened for rehab admission 2. ESRD -MWF usual schedule. Had HD [facility] last week.  Next HD due Monday. Labs ok yesterday to defer until Monday.- hold heparin for now - on 3 Ca as outpt - use 2.5 here 3. Hypertension/volume - BP controlled/volume stable. Cont home meds. 4. Anemia - Hgb 11.5 > 10.7  Follow trend Not on ESA 5. Metabolic bone disease -Continue calcitriol/Velphoro 3 tabs tid, Ca acetate 1 tab tid. Parsabiv not on hospital formulary 6. Nutrition -Renal diet/vitamins 7. DM Type I - insulin per primary - BS high - didn't get full Lantus dose last night per pt 8. Leukocytosis WBC 25 K > 22.9- had fever and chills 4/24 at outpatient HD [facility] drawn no growth so far - empiric Vanc and Fortaz given; afebrile here/ not on abtx -   Myriam Jacobson, PA-C [facility] Beeper [phone] 06/08/2017,10:18 AM  LOS: 2 days   Subjective:   [place] in bathroom last night while using walker - had sensation of still having foot  Objective Vitals:   06/07/17 0521 06/07/17 1352 06/07/17 2042 06/08/17 0435  BP: 113/67 118/69 128/76 120/78  Pulse: 62 78 76 78  Resp:  18 15 16   Temp: 98.7 F (37.1 C) 98.6 F (37 C) 97.7 F (36.5 C) 97.9 F (36.6 C)  TempSrc:   Oral Oral  SpO2: 99% 100% 100% 99%  Weight:      Height:       Physical Exam General: NAD Heart: RRR Lungs: no rales Abdomen: soft NT Extremities: right BKA immobilized- LLE no edema Dialysis Access: right AVF + bruit   Additional Objective Labs: Basic Metabolic Panel: Recent Labs  Lab 06/06/17 0812 06/07/17 1142  NA 131* 130*  K 3.8 3.7  CL 90* 90*  CO2 21* 27  GLUCOSE 280* 354*  BUN 23* 49*  CREATININE 7.08* 9.62*  CALCIUM 8.6* 7.8*    CBC: Recent Labs  Lab 06/06/17 0812 06/07/17 1142  WBC 25.0* 22.9*  HGB 11.5* 10.9*  HCT 34.0* 32.2*  MCV 100.6* 98.5  PLT 489* 561*  CBG: Recent Labs  Lab 06/07/17 0801 06/07/17 1111 06/07/17 1635 06/07/17 2142 06/08/17 0617  GLUCAP 395* 353* 131* 172* 242*   Iron Studies: No results for input(s): IRON, TIBC, TRANSFERRIN, FERRITIN in the last 72 hours. Lab Results  Component Value Date   INR 1.16 11/30/2015  Medications: . sodium chloride 10 mL/hr at 06/06/17 1629  . methocarbamol (ROBAXIN)  IV     . aspirin EC  325 mg Oral Daily  . calcium acetate  667 mg Oral TID WC  . carvedilol  3.125 mg Oral BID WC  . docusate sodium  100 mg Oral BID  . gabapentin  300 mg Oral TID  . insulin aspart  0-9 Units Subcutaneous TID WC  . insulin aspart  3 Units Subcutaneous TID WC  . insulin glargine  30 Units Subcutaneous QHS  . lisinopril  20 mg Oral QHS  . sucroferric oxyhydroxide  1,500 mg Oral TID WC

## 2017-06-08 NOTE — Progress Notes (Signed)
Physical Therapy Treatment Patient Details Name: Lance [name] MRN: [id number] DOB: June 07, 1970 Today's Date: 06/08/2017    History of Present Illness Pt is a 47 y/o male s/p R transtibial amputation. PMH including but not limited to CHF, ESRD and DM.    PT Comments    Patient is progressing very well towards their physical therapy goals. Upon entrance to the room, patient lying in bed with middle section of bed (directly under knee) elevated. PT flattened bed and explained in detail why patient should avoid elevating bed underneath knee or placing pillow below knee in order to prevent right knee flexion contracture; patient verbalized understanding. Session focused on gait training in hallway with RW and functional strength training. Patient able to progress ambulation to 25 feet using RW and min guard assist (chair follow utilized). Patient taking slow, steady hops. Patient continues with difficulty with sit to stand transfers due to unsteadiness, requiring up to moderate assistance. Practiced several sit to stands from recliner; patient would benefit from further training. Continue to highly recommend CIR; patient is extremely motivated to participate with therapy and has great potential for progressing mobility, strengthening, and range of motion.     Follow Up Recommendations  CIR     Equipment Recommendations  Rolling walker with 5" wheels;3in1 (PT);Wheelchair (measurements PT);Wheelchair cushion (measurements PT)    Recommendations for Other Services Rehab consult     Precautions / Restrictions Precautions Precautions: Fall Precaution Comments: wound VAC Required Braces or Orthoses: Other Brace/Splint Other Brace/Splint: residual limb protector Restrictions Weight Bearing Restrictions: Yes RLE Weight Bearing: Non weight bearing    Mobility  Bed Mobility Overal bed mobility: Needs Assistance Bed Mobility: Supine to Sit     Supine to sit: Supervision Sit to supine:  Supervision   General bed mobility comments: supervision for safety  Transfers Overall transfer level: Needs assistance Equipment used: Rolling walker (2 wheeled) Transfers: Sit to/from Stand Sit to Stand: Mod assist;+2 safety/equipment         General transfer comment: min verbal cueing for hand placement and scooting forward to edge. Moderate assistance to boost up and steady.   Ambulation/Gait Ambulation/Gait assistance: Min guard Ambulation Distance (Feet): 25 Feet Assistive device: Rolling walker (2 wheeled)       General Gait Details: Patient able to take slow, short hops using RW   Stairs             Wheelchair Mobility    Modified Rankin (Stroke Patients Only)       Balance Overall balance assessment: Needs assistance Sitting-balance support: Feet supported Sitting balance-Leahy Scale: Good     Standing balance support: During functional activity;Bilateral upper extremity supported Standing balance-Leahy Scale: Poor Standing balance comment: reliant on BUE support                            Cognition Arousal/Alertness: Awake/alert Behavior During Therapy: WFL for tasks assessed/performed Overall Cognitive Status: Within Functional Limits for tasks assessed                                        Exercises General Exercises - Lower Extremity Long Arc Quad: Right;10 reps;Seated Other Exercises Other Exercises: Instructed to keep bed flat to promote right knee extension Other exercises: 4 sit to stands from recliner to RW    General Comments  Pertinent Vitals/Pain Pain Assessment: No/denies pain    Home Living                      Prior Function            PT Goals (current goals can now be found in the care plan section) Acute Rehab PT Goals Patient Stated Goal: return home, eventually get a prosthesis PT Goal Formulation: With patient Time For Goal Achievement: 06/21/17 Potential to  Achieve Goals: Good Progress towards PT goals: Progressing toward goals    Frequency    Min 5X/week      PT Plan Current plan remains appropriate    Co-evaluation              AM-PAC PT "6 Clicks" Daily Activity  Outcome Measure  Difficulty turning over in bed (including adjusting bedclothes, sheets and blankets)?: None Difficulty moving from lying on back to sitting on the side of the bed? : None Difficulty sitting down on and standing up from a chair with arms (e.g., wheelchair, bedside commode, etc,.)?: Unable Help needed moving to and from a bed to chair (including a wheelchair)?: A Lot Help needed walking in hospital room?: A Little Help needed climbing 3-5 steps with a railing? : Total 6 Click Score: 15    End of Session Equipment Utilized During Treatment: Gait belt Activity Tolerance: Patient tolerated treatment well Patient left: with call bell/phone within reach;in chair Nurse Communication: Mobility status PT Visit Diagnosis: Other abnormalities of gait and mobility (R26.89);Difficulty in walking, not elsewhere classified (R26.2)     Time: 4665-9935 PT Time Calculation (min) (ACUTE ONLY): 26 min  Charges:  $Gait Training: 23-37 mins                    G Codes:       Ellamae Sia, PT, DPT Acute Rehabilitation Services  Pager: [phone]    Willy Eddy 06/08/2017, 4:20 PM

## 2017-06-08 NOTE — Progress Notes (Signed)
   Subjective: 2 Days Post-Op Procedure(s) (LRB): RIGHT BELOW KNEE AMPUTATION (Right) Patient reports pain as moderate.    Objective: Vital signs in last 24 hours: Temp:  [97.7 F (36.5 C)-98.6 F (37 C)] 97.9 F (36.6 C) (04/28 0435) Pulse Rate:  [76-78] 78 (04/28 0435) Resp:  [15-18] 16 (04/28 0435) BP: (118-128)/(69-78) 120/78 (04/28 0435) SpO2:  [99 %-100 %] 99 % (04/28 0435)  Intake/Output from previous day: 04/27 0701 - 04/28 0700 In: 600 [P.O.:600] Out: 1 [Stool:1] Intake/Output this shift: No intake/output data recorded.  Recent Labs    06/06/17 0812 06/07/17 1142  HGB 11.5* 10.9*   Recent Labs    06/06/17 0812 06/07/17 1142  WBC 25.0* 22.9*  RBC 3.38* 3.27*  HCT 34.0* 32.2*  PLT 489* 561*   Recent Labs    06/06/17 0812 06/07/17 1142  NA 131* 130*  K 3.8 3.7  CL 90* 90*  CO2 21* 27  BUN 23* 49*  CREATININE 7.08* 9.62*  GLUCOSE 280* 354*  CALCIUM 8.6* 7.8*   No results for input(s): LABPT, INR in the last 72 hours.  VAC good seal . working with PT ambulating in hall with assistance right now.  No results found.  Assessment/Plan: 2 Days Post-Op Procedure(s) (LRB): RIGHT BELOW KNEE AMPUTATION (Right) Up with therapy  Lance [name] 06/08/2017, 12:44 PM

## 2017-06-08 NOTE — Progress Notes (Signed)
Pt called to help him assist to the restroom for BM. RN offered [facility] this time but pt insisted to go the restroom. RN assisted pt to walk to the restroom with a walker. Pt lost his balance but has safely sat down to the toilet. Pt verbalized 'It just feels like my leg is still there touching the floor". RN and NT assisted back the patient to the bed with stedy. Pt was given oxycodone and robaxin for pain rate 8/10 on his right leg. Denies any other complaints this time.

## 2017-06-08 NOTE — Progress Notes (Signed)
Inpatient Diabetes Program Recommendations  AACE/ADA: New Consensus Statement on Inpatient Glycemic Control (2015)  Target Ranges:  Prepandial:   less than 140 mg/dL      Peak postprandial:   less than 180 mg/dL (1-2 hours)      Critically ill patients:  140 - 180 mg/dL   Results for Lance [name], Lance [name] (MRN [id number]) as of 06/08/2017 11:55  Ref. Range 06/07/2017 06:35 06/07/2017 08:01 06/07/2017 11:11 06/07/2017 16:35 06/07/2017 21:42  Glucose-Capillary Latest Ref Range: 65 - 99 mg/dL 409 (H)  18 units NOVOLOG  395 (H)  12 units NOVOLOG 353 (H)  12 units NOVOLOG 131 (H)  7 units NOVOLOG 172 (H)   30 units LANTUS   Results for [name], [name] (MRN [id number]) as of 06/08/2017 11:55  Ref. Range 06/08/2017 06:17 06/08/2017 11:17  Glucose-Capillary Latest Ref Range: 65 - 99 mg/dL 242 (H)  6 units NOVOLOG 219 (H)     Admit Gangrene Foot.   History: DM, ESRD  Home DM Meds: Lantus 30 units QHS       Humalog 12-18 units TID  Current Orders: Lantus 30 units QHS       Novolog Sensitive Correction Scale/ SSI (0-9 units) TID AC      Novolog 3 units TID with meals      Underwent Transtibial amputation with Wound Vac placement on 04/26.  CBGs labile.    MD- Please consider the following in-hospital insulin adjustments:  1. Increase Lantus to 35 units QHS  2. Increase Novolog Meal Coverage to: Novolog 6 units TID with meals (hold if pt eats <50% of meal)      --Will follow patient during hospitalization--  Wyn Quaker RN, MSN, CDE Diabetes Coordinator Inpatient Glycemic Control Team Team Pager: [phone] (8a-5p)

## 2017-06-09 DIAGNOSIS — Z992 Dependence on renal dialysis: Secondary | ICD-10-CM

## 2017-06-09 DIAGNOSIS — D62 Acute posthemorrhagic anemia: Secondary | ICD-10-CM

## 2017-06-09 DIAGNOSIS — N186 End stage renal disease: Secondary | ICD-10-CM

## 2017-06-09 DIAGNOSIS — S88111A Complete traumatic amputation at level between knee and ankle, right lower leg, initial encounter: Secondary | ICD-10-CM

## 2017-06-09 DIAGNOSIS — E119 Type 2 diabetes mellitus without complications: Secondary | ICD-10-CM

## 2017-06-09 DIAGNOSIS — D72829 Elevated white blood cell count, unspecified: Secondary | ICD-10-CM

## 2017-06-09 DIAGNOSIS — D638 Anemia in other chronic diseases classified elsewhere: Secondary | ICD-10-CM

## 2017-06-09 DIAGNOSIS — R000 Tachycardia, unspecified: Secondary | ICD-10-CM

## 2017-06-09 DIAGNOSIS — I5032 Chronic diastolic (congestive) heart failure: Secondary | ICD-10-CM

## 2017-06-09 DIAGNOSIS — Z8679 Personal history of other diseases of the circulatory system: Secondary | ICD-10-CM

## 2017-06-09 DIAGNOSIS — G8918 Other acute postprocedural pain: Secondary | ICD-10-CM

## 2017-06-09 LAB — RENAL FUNCTION PANEL
Albumin: 2 g/dL — ABNORMAL LOW (ref 3.5–5.0)
Anion gap: 16 — ABNORMAL HIGH (ref 5–15)
BUN: 68 mg/dL — AB (ref 6–20)
CALCIUM: 8.1 mg/dL — AB (ref 8.9–10.3)
CHLORIDE: 97 mmol/L — AB (ref 101–111)
CO2: 23 mmol/L (ref 22–32)
Creatinine, Ser: 13.86 mg/dL — ABNORMAL HIGH (ref 0.61–1.24)
GFR, EST AFRICAN AMERICAN: 4 mL/min — AB (ref >60)
GFR, EST NON AFRICAN AMERICAN: 4 mL/min — AB (ref >60)
Glucose, Bld: 76 mg/dL (ref 65–99)
Phosphorus: 3.8 mg/dL (ref 2.5–4.6)
Potassium: 3.2 mmol/L — ABNORMAL LOW (ref 3.5–5.1)
SODIUM: 136 mmol/L (ref 135–145)

## 2017-06-09 LAB — CBC
HCT: 31.7 % — ABNORMAL LOW (ref 39.0–52.0)
Hemoglobin: 10.9 g/dL — ABNORMAL LOW (ref 13.0–17.0)
MCH: 33.6 pg (ref 26.0–34.0)
MCHC: 34.4 g/dL (ref 30.0–36.0)
MCV: 97.8 fL (ref 78.0–100.0)
PLATELETS: 640 10*3/uL — AB (ref 150–400)
RBC: 3.24 MIL/uL — AB (ref 4.22–5.81)
RDW: 15 % (ref 11.5–15.5)
WBC: 19.8 10*3/uL — ABNORMAL HIGH (ref 4.0–10.5)

## 2017-06-09 LAB — GLUCOSE, CAPILLARY
GLUCOSE-CAPILLARY: 108 mg/dL — AB (ref 65–99)
GLUCOSE-CAPILLARY: 298 mg/dL — AB (ref 65–99)
GLUCOSE-CAPILLARY: 236 mg/dL — AB (ref 65–99)

## 2017-06-09 MED ORDER — LIDOCAINE-PRILOCAINE 2.5-2.5 % EX CREA
1.0000 "application " | TOPICAL_CREAM | CUTANEOUS | Status: DC | PRN
  Filled 2017-06-09: qty 5

## 2017-06-09 MED ORDER — LIDOCAINE HCL (PF) 1 % IJ SOLN: 5.0000 mL | INTRAMUSCULAR | Status: DC | PRN

## 2017-06-09 MED ORDER — SODIUM CHLORIDE 0.9 % IV SOLN: 100.0000 mL | INTRAVENOUS | Status: DC | PRN

## 2017-06-09 MED ORDER — OXYCODONE HCL 5 MG PO TABS
ORAL_TABLET | ORAL | Status: AC
  Filled 2017-06-09: qty 2

## 2017-06-09 MED ORDER — ALTEPLASE 2 MG IJ SOLR
2.0000 mg | Freq: Once | INTRAMUSCULAR | Status: DC | PRN
  Filled 2017-06-09: qty 2

## 2017-06-09 MED ORDER — HEPARIN SODIUM (PORCINE) 1000 UNIT/ML DIALYSIS
1000.0000 [IU] | INTRAMUSCULAR | Status: DC | PRN
  Filled 2017-06-09: qty 1

## 2017-06-09 MED ORDER — PENTAFLUOROPROP-TETRAFLUOROETH EX AERO: 1.0000 "application " | INHALATION_SPRAY | CUTANEOUS | Status: DC | PRN

## 2017-06-09 NOTE — Progress Notes (Signed)
OT Cancellation Note  Patient Details Name: Lance [name] MRN: [id number] DOB: [birth year]   Cancelled Treatment:    Reason Eval/Treat Not Completed: Patient at procedure or test/ unavailable(HD)  Parke Poisson B 06/09/2017, 9:33 AM

## 2017-06-09 NOTE — Progress Notes (Signed)
Inpatient Rehabilitation  Met with patient at bedside to discuss team's recommendation for IP Rehab.  Shared booklets and answered initial questions.  Have initiated insurance authorization.  Plan to follow for timing of medical readiness, insurance approval, and IP Rehab bed availability.  Of note, OT updates are needed for insurance.  Call if questions.    Carmelia Roller., CCC/SLP Admission Coordinator  [place]  Cell [phone]

## 2017-06-09 NOTE — Progress Notes (Signed)
Physical Therapy Treatment Patient Details Name: Lance [name] MRN: [id number] DOB: 08/10/1970 Today's Date: 06/09/2017    History of Present Illness Pt is a 47 y/o male s/p R transtibial amputation. PMH including but not limited to CHF, ESRD and DM.    PT Comments    Patient is progressing very well towards their physical therapy goals. Slight fatigue from HD session this AM, but still motivated and eager to participate fully in therapy session. Hopped 30 feet (15x15) with RW and min guard assist; one seated rest break required. Slight improvement with steadiness progressing from sit to stand but still requires up to moderate assistance. Patient presenting with decreased short term memory. Asked PT again today about using crutches (asked same question yesterday). PT explained crutches would be a progression from the RW and his balance is not quite adequate enough yet to trial crutches. Upon arrival, patient also found supine with foot of bed elevated so that knee was bent. This was after lengthy discussion yesterday about risks of contracture with knee resting in flexed position. PT locked bed out so that this feature is unavailable. Patient and patient family provided reinforced instruction about avoiding placing a pillow under the knee and keeping the right knee as flat as possible to prepare for future prosthetic use.     Follow Up Recommendations  CIR     Equipment Recommendations  Rolling walker with 5" wheels;3in1 (PT);Wheelchair (measurements PT);Wheelchair cushion (measurements PT)    Recommendations for Other Services       Precautions / Restrictions Precautions Precautions: Fall Precaution Comments: wound VAC Required Braces or Orthoses: Other Brace/Splint Other Brace/Splint: residual limb protector Restrictions Weight Bearing Restrictions: Yes RLE Weight Bearing: Non weight bearing    Mobility  Bed Mobility Overal bed mobility: Needs Assistance Bed Mobility:  Supine to Sit     Supine to sit: Supervision     General bed mobility comments: supine to sit with increased time  Transfers Overall transfer level: Needs assistance Equipment used: Rolling walker (2 wheeled) Transfers: Sit to/from Stand Sit to Stand: Mod assist;+2 safety/equipment         General transfer comment: min verbal cueing for hand placement and scooting forward to edge. Moderate assistance to boost up and steady.   Ambulation/Gait Ambulation/Gait assistance: Min guard Ambulation Distance (Feet): 30 Feet Assistive device: Rolling walker (2 wheeled)       General Gait Details: Hopped 30 feet (15x15) with RW; chair follow utilized.   Stairs             Wheelchair Mobility    Modified Rankin (Stroke Patients Only)       Balance Overall balance assessment: Needs assistance Sitting-balance support: Feet supported Sitting balance-Leahy Scale: Good     Standing balance support: During functional activity;Bilateral upper extremity supported Standing balance-Leahy Scale: Poor Standing balance comment: reliant on BUE support                            Cognition Arousal/Alertness: Awake/alert Behavior During Therapy: WFL for tasks assessed/performed Overall Cognitive Status: Within Functional Limits for tasks assessed                                        Exercises General Exercises - Lower Extremity Quad Sets: Right;10 reps;Supine Other Exercises Other Exercises: Instructed patient and patient family to keep bed flat  to promote right knee extension    General Comments        Pertinent Vitals/Pain Pain Assessment: No/denies pain    Home Living                      Prior Function            PT Goals (current goals can now be found in the care plan section) Acute Rehab PT Goals PT Goal Formulation: With patient Time For Goal Achievement: 06/21/17 Potential to Achieve Goals: Good Progress towards PT  goals: Progressing toward goals    Frequency    Min 5X/week      PT Plan Current plan remains appropriate    Co-evaluation              AM-PAC PT "6 Clicks" Daily Activity  Outcome Measure  Difficulty turning over in bed (including adjusting bedclothes, sheets and blankets)?: None Difficulty moving from lying on back to sitting on the side of the bed? : A Little Difficulty sitting down on and standing up from a chair with arms (e.g., wheelchair, bedside commode, etc,.)?: Unable Help needed moving to and from a bed to chair (including a wheelchair)?: A Lot Help needed walking in hospital room?: A Little Help needed climbing 3-5 steps with a railing? : Total 6 Click Score: 14    End of Session Equipment Utilized During Treatment: Gait belt Activity Tolerance: Patient tolerated treatment well Patient left: with call bell/phone within reach;in chair Nurse Communication: Mobility status PT Visit Diagnosis: Other abnormalities of gait and mobility (R26.89);Difficulty in walking, not elsewhere classified (R26.2)     Time: 0071-2197 PT Time Calculation (min) (ACUTE ONLY): 24 min  Charges:  $Gait Training: 23-37 mins                    G Codes:       Ellamae Sia, PT, DPT Acute Rehabilitation Services  Pager: [place] 06/09/2017, 4:42 PM

## 2017-06-09 NOTE — Progress Notes (Addendum)
Patient ID: Lance [name], male   DOB: November 15, 1970, 48 y.o.   MRN: [id number] Patient is making good progress with therapy and inpatient rehabilitation has been consulted.  Plan for discharge to inpatient rehabilitation if this is approved.  Patient will dialysis this morning.  No drainage in the wound VAC canister.  Patient has a stump protector in place from [place].

## 2017-06-09 NOTE — Progress Notes (Signed)
Report received- patient remains in dialysis

## 2017-06-09 NOTE — Progress Notes (Addendum)
Inpatient Diabetes Program Recommendations  AACE/ADA: New Consensus Statement on Inpatient Glycemic Control (2015)  Target Ranges:  Prepandial:   less than 140 mg/dL      Peak postprandial:   less than 180 mg/dL (1-2 hours)      Critically ill patients:  140 - 180 mg/dL   Lab Results  Component Value Date   GLUCAP 115 (H) 06/08/2017   HGBA1C 9.1 (H) 02/18/2017    Review of Glycemic Control Results for OSHAY, STRANAHAN (MRN [id number]) as of 06/09/2017 10:11  Ref. Range 06/08/2017 11:17 06/08/2017 16:43 06/08/2017 20:33  Glucose-Capillary Latest Ref Range: 65 - 99 mg/dL 219 (H) 130 (H) 115 (H)   Diabetes history: Type 1 DM Outpatient Diabetes medications: Humalog 12-18 units TID, Lantus 30 units QHS Current orders for Inpatient glycemic control: Novolog 0-9 units TID, Novolog 3 units TID, Lantus 35 units QHS  Inpatient Diabetes Program Recommendations:    Last A1C was 9.1% on 02/18/2017, consider repeating A1C to evaluate control?  Noted elevated post prandial today, patient missed meal coverage due to hemodialysis.   Addendum @1640 : Spoke with patient regarding diabetes home regimen. Patient is very savvy and has had diabetes since he was 47 years old. He is managed outpatient by Dr Dorris Fetch and plans to follow up with an appointment once discharge.  Reviewed patient's current A1c of 9.1%. Explained what a A1c is and what it measures. Also reviewed goal A1c with patient, importance of good glucose control @ home, and blood sugar goals. Reviewed long term co-morbidities, need for insulin and vascular changes that occur.  Patient is active with children. Has a [employer] on and used this for the last few weeks.  Attempting to gain better control during dialysis. Patient states, "My BS goes up during/at end of dialysis. He reports having a snack during dialysis, but covers the carbs appropriately. We reviewed his insulin administration schedule on days of dialysis, patient is not skipping  doses. Encouraged to follow up with Dr Dorris Fetch and take Freestyle meter to appointment as he may need adjustments to insulin admin on dialysis days.  At this time, patient does not have any further questions related to diabetes.    Thanks, Bronson Curb, MSN, RNC-OB Diabetes Coordinator [phone] (8a-5p)

## 2017-06-09 NOTE — Consult Note (Signed)
Physical Medicine and Rehabilitation Consult Reason for Consult: Decreased functional mobility Referring Physician: Dr. Sharol Given   HPI: Lance [name] is a 47 y.o. right-handed male with history of end-stage renal disease with hemodialysis, diastolic congestive heart failure, PSVT, diabetes mellitus, tobacco abuse as well as peripheral vascular disease.  Per chart review and patient, patient lives with wife and 32 year old daughter.  They have a 60 year old son in college.  One level home with planned ramp.  Wife works during the day.  He does have a mother close by who can assist.  Presented 06/06/2017 with gangrenous changes of the right foot.  Recent vascular studies by Dr. Trula Slade showed no revascularization options.  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 Sharol Given.  Wound VAC applied.  Hospital course pain management.  Hemodialysis ongoing as per renal services.  Acute on chronic anemia 10.9 and monitored.  Leukocytosis 22,000.  Physical therapy evaluation completed with recommendations of physical medicine rehab consult.  Review of Systems  Constitutional: Negative for chills and fever.  HENT: Negative for hearing loss.   Eyes: Negative for blurred vision and double vision.  Respiratory: Negative for cough and shortness of breath.   Cardiovascular: Positive for leg swelling. Negative for chest pain and palpitations.  Gastrointestinal: Positive for constipation. Negative for nausea.  Genitourinary: Negative for dysuria, flank pain and hematuria.  Musculoskeletal: Positive for joint pain and myalgias.  All other systems reviewed and are negative.  Past Medical History:  Diagnosis Date  . Aortic stenosis    Very mild in 05/2007  . Arthritis    "right shoulder; right knee; feel like I've got it all over" (11/28/2015)  . CHF (congestive heart failure) ([place])   . Degenerative joint disease    Left TKA  . Diastolic heart failure (HCC)    LVEF 65-70% with grade 2  diastolic dysfunction  . ESRD (end stage renal disease) on dialysis ([place])    "MWF; [facility] on [address]" (11/28/2015)  . Essential hypertension, benign 2000   LVH  . GERD (gastroesophageal reflux disease)   . HCAP (healthcare-associated pneumonia) 11/28/2015  . Hyperlipidemia 2000  . Loculated pleural effusion 11/28/2015   [name] 11/28/2015  . Pneumonia 12/2013; 05/2014  . PSVT (paroxysmal supraventricular tachycardia) (HCC)    Nonsustained; asymptomatic; diagnosed by event recorder in 2006  . Tobacco abuse, in remission    20 pack years; discontinued in 1985; bronchitic changes on chest x-ray and 2009  . Type 1 diabetes mellitus ([place]) 1990   Past Surgical History:  Procedure Laterality Date  . AMPUTATION Right 06/06/2017   Procedure: RIGHT BELOW KNEE AMPUTATION;  Surgeon: [name], MD;  Location: [facility];  Service: Orthopedics;  Laterality: Right;  . AV FISTULA PLACEMENT Right 06/23/2014   Procedure: ARTERIOVENOUS (AV) FISTULA CREATION;  Surgeon: Angelia Mould, MD;  Location: [facility];  Service: Vascular;  Laterality: Right;  . CARDIAC CATHETERIZATION     [facility]- evaluation for Kidney/ pancreas transplant  . EYE SURGERY Bilateral    "for glaucoma"  . INGUINAL HERNIA REPAIR Right    As a child  . LOWER EXTREMITY ANGIOGRAPHY N/A 05/27/2017   Procedure: LOWER EXTREMITY ANGIOGRAPHY;  Surgeon: [name], MD;  Location: [facility];  Service: Cardiovascular;  Laterality: N/A;  . WISDOM TOOTH EXTRACTION  1987   Family History  Problem Relation Age of Onset  . Diabetes Father   . Hypertension Father   . Diabetes Mother   . Hypertension Mother  Social History:  reports that he has never smoked. He has never used smokeless tobacco. He reports that he drinks alcohol. He reports that he does not use drugs. Allergies:  Allergies  Allergen Reactions  . Atorvastatin Other (See Comments)    Myalgias   . Minoxidil Other (See Comments)    Fluid retention     Medications Prior to Admission  Medication Sig Dispense Refill  . calcium acetate (PHOSLO) 667 MG capsule Take 1 capsule by mouth 3 (three) times daily.  10  . carvedilol (COREG CR) 10 MG 24 hr capsule Take 5 mg by mouth 2 (two) times daily.     . Continuous Blood Gluc Sensor (FREESTYLE LIBRE SENSOR SYSTEM) MISC Use one sensor every 10 days. 3 each 2  . HUMALOG KWIKPEN 100 UNIT/ML KiwkPen Inject 12-18 Units into the skin 3 (three) times daily before meals.  0  . HYDROcodone-acetaminophen (NORCO/VICODIN) 5-325 MG tablet Take 1 tablet by mouth every 6 (six) hours as needed for moderate pain. 20 tablet 0  . LANTUS 100 UNIT/ML injection Inject 30 Units into the skin at bedtime.  10  . lisinopril (PRINIVIL,ZESTRIL) 20 MG tablet Take 20 mg by mouth at bedtime.     . ranitidine (ZANTAC) 75 MG tablet Take 75 mg by mouth daily as needed for heartburn.    . sucroferric oxyhydroxide (VELPHORO) 500 MG chewable tablet Chew 3 tablets by mouth 3 (three) times daily.      Home: Home Living Family/patient expects to be discharged to:: Private residence Living Arrangements: Alone Available Help at Discharge: Family, Friend(s), Available PRN/intermittently, Other (Comment)(will only be alone for ~2 hrs) Type of Home: House Home Access: Stairs to enter, Other (comment)(working on getting a ramp) Entrance Stairs-Number of Steps: 5 Entrance Stairs-Rails: Right, Left Home Layout: Able to live on main level with bedroom/bathroom Bathroom Shower/Tub: Walk-in shower Home Equipment: Crutches  Functional History: Prior Function Level of Independence: Independent Functional Status:  Mobility: Bed Mobility Overal bed mobility: Needs Assistance Bed Mobility: Supine to Sit Supine to sit: Supervision Sit to supine: Supervision General bed mobility comments: supervision for safety Transfers Overall transfer level: Needs assistance Equipment used: Rolling walker (2 wheeled) Transfers: Sit to/from Stand Sit  to Stand: Mod assist, +2 safety/equipment General transfer comment: min verbal cueing for hand placement and scooting forward to edge. Moderate assistance to boost up and steady.  Ambulation/Gait Ambulation/Gait assistance: Min guard Ambulation Distance (Feet): 25 Feet Assistive device: Rolling walker (2 wheeled) General Gait Details: Patient able to take slow, short hops using RW    ADL:    Cognition: Cognition Overall Cognitive Status: Within Functional Limits for tasks assessed Orientation Level: Oriented X4 Cognition Arousal/Alertness: Awake/alert Behavior During Therapy: WFL for tasks assessed/performed Overall Cognitive Status: Within Functional Limits for tasks assessed  Blood pressure (!) 142/80, pulse 89, temperature 98.4 F (36.9 C), temperature source Oral, resp. rate 17, height 6\' 3"  (1.905 m), weight 106.8 kg (235 lb 7.2 oz), SpO2 100 %. Physical Exam  Vitals reviewed. Constitutional: He is oriented to person, place, and time. He appears well-developed and well-nourished.  HENT:  Head: Normocephalic and atraumatic.  Eyes: EOM are normal. Right eye exhibits no discharge. Left eye exhibits no discharge.  Neck: Normal range of motion. Neck supple. No thyromegaly present.  Cardiovascular: Normal rate, regular rhythm and normal heart sounds.  Respiratory: Effort normal. No respiratory distress.  GI: Soft. Bowel sounds are normal. He exhibits no distension.  Musculoskeletal: He exhibits tenderness (RLE).  Neurological: He is  alert and oriented to person, place, and time.  Motor: 5/5 B/l UE, LLE, RLE HF Sensation intact to light touch  Skin:  BKA site is dressed with limb guard in place wound VAC  Psychiatric: He has a normal mood and affect. His behavior is normal.    Results for orders placed or performed during the hospital encounter of 06/06/17 (from the past 24 hour(s))  Glucose, capillary     Status: Abnormal   Collection Time: 06/08/17 11:17 AM  Result Value  Ref Range   Glucose-Capillary 219 (H) 65 - 99 mg/dL  Glucose, capillary     Status: Abnormal   Collection Time: 06/08/17  4:43 PM  Result Value Ref Range   Glucose-Capillary 130 (H) 65 - 99 mg/dL  Glucose, capillary     Status: Abnormal   Collection Time: 06/08/17  8:33 PM  Result Value Ref Range   Glucose-Capillary 115 (H) 65 - 99 mg/dL   No results found.   Assessment/Plan: Diagnosis: Right BKA Labs independently reviewed.  Records reviewed and summated above. Clean amputation daily with soap and water Monitor incision site for signs of infection or impending skin breakdown. Staples to remain in place for 3-4 weeks Stump shrinker, for edema control  Scar mobilization massaging to prevent soft tissue adherence Stump protector during therapies Prevent flexion contractures by implementing the following:   Encourage prone lying for 20-30 mins per day BID to avoid hip flexion  Contractures if medically appropriate;  Avoid pillow under knees when patient is lying in bed in order to prevent both  knee and hip flexion contractures;  Avoid prolonged sitting Post surgical pain control with oral medication Phantom limb pain control with physical modalities including desensitization techniques (gentle self massage to the residual stump,hot packs if sensation intact, [place]) and mirror therapy, TENS. If ineffective, consider pharmacological treatment for neuropathic pain (e.g gabapentin, pregabalin, amytriptalyine, duloxetine).  When using wheelchair, patient should have knee on amputated side fully extended with board under the seat cushion. Avoid injury to contralateral side  1. Does the need for close, 24 hr/day medical supervision in concert with the patient's rehab needs make it unreasonable for this patient to be served in a less intensive setting? Yes  2. Co-Morbidities requiring supervision/potential complications: end-stage renal disease (cont HD, recs per Nephro), diastolic congestive  heart failure (monitor for signs/symptoms of fluid overload), PSVT, diabetes mellitus (Monitor in accordance with exercise and adjust meds as necessary), tobacco abuse (counsel), peripheral vascular disease, post-op pain (Biofeedback training with therapies to help reduce reliance on opiate pain medications, monitor pain control during therapies, and sedation at rest and titrate to maximum efficacy to ensure participation and gains in therapies), Acute on chronic anemia (transfuse if necessary to ensure appropriate perfusion for increased activity tolerance), leukocytosis (cont to monitor for signs and symptoms of infection, further workup if indicated), Tachycardia (monitor in accordance with pain and increasing activity) 3. Due to safety, skin/wound care, disease management, pain management and patient education, does the patient require 24 hr/day rehab nursing? Yes 4. Does the patient require coordinated care of a physician, rehab nurse, PT (1-2 hrs/day, 5 days/week) and OT (1-2 hrs/day, 5 days/week) to address physical and functional deficits in the context of the above medical diagnosis(es)? Yes Addressing deficits in the following areas: balance, endurance, locomotion, strength, transferring, bathing, dressing, toileting and psychosocial support 5. Can the patient actively participate in an intensive therapy program of at least 3 hrs of therapy per day at least 5 days per  week? Yes 6. The potential for patient to make measurable gains while on inpatient rehab is excellent 7. Anticipated functional outcomes upon discharge from inpatient rehab are modified independent  with PT, modified independent with OT, n/a with SLP. 8. Estimated rehab length of stay to reach the above functional goals is: 5-7 days. 9. Anticipated D/C setting: Home 10. Anticipated post D/C treatments: HH therapy and Home excercise program 11. Overall Rehab/Functional Prognosis: good  RECOMMENDATIONS: This patient's condition is  appropriate for continued rehabilitative care in the following setting: CIR Patient has agreed to participate in recommended program. Yes Note that insurance prior authorization may be required for reimbursement for recommended care.  Comment: Rehab Admissions Coordinator to follow up.  I have personally performed a face to face diagnostic evaluation, including, but not limited to relevant history and physical exam findings, of this patient and developed relevant assessment and plan.  Additionally, I have reviewed and concur with the physician assistant's documentation above.   Delice Lesch, MD, ABPMR Lavon Paganini Angiulli, PA-C 06/09/2017

## 2017-06-09 NOTE — Progress Notes (Signed)
Patient completed HD trmt at 1133.  His system clotted at 1021, with clots in art and venous chambers.  Unable to return circuit blood to patient.  New system hung and trmt resumed at 1035am.  Otherwise tolerated HD trmt.  D/W Dr. Jonnie Finner need for heparin w/ next trmt (no wound drainage), and post weight 102.0 kg.  Pt's pre-surgical dry weight was 107kg.  Tolerated fluid removal without difficulty.

## 2017-06-09 NOTE — Progress Notes (Signed)
[facility] Progress Note  Subjective: may be going to inpt rehab, on dialysis w/o c/o  Vitals:   06/09/17 0900 06/09/17 0930 06/09/17 1000 06/09/17 1035  BP: 113/77 103/67 92/68 101/69  Pulse: (!) 111 (!) 103 100 96  Resp: 20 18 18 18   Temp:      TempSrc:      SpO2:      Weight:      Height:        Inpatient medications: . aspirin EC  325 mg Oral Daily  . calcitRIOL  0.25 mcg Oral Q M,W,F-HD  . calcium acetate  667 mg Oral TID WC  . carvedilol  3.125 mg Oral BID WC  . docusate sodium  100 mg Oral BID  . gabapentin  300 mg Oral TID  . insulin aspart  0-9 Units Subcutaneous TID WC  . insulin aspart  3 Units Subcutaneous TID WC  . insulin glargine  35 Units Subcutaneous QHS  . lisinopril  20 mg Oral QHS  . oxyCODONE      . sucroferric oxyhydroxide  1,500 mg Oral TID WC   . sodium chloride 10 mL/hr at 06/06/17 1629  . methocarbamol (ROBAXIN)  IV     acetaminophen, bisacodyl, HYDROmorphone (DILAUDID) injection, magnesium citrate, methocarbamol **OR** methocarbamol (ROBAXIN)  IV, metoCLOPramide **OR** metoCLOPramide (REGLAN) injection, ondansetron **OR** ondansetron (ZOFRAN) IV, oxyCODONE, oxyCODONE, polyethylene glycol  Exam: Alert, no distress No jvd Chest cta bilat RRR Abd obese soft ntnd Ext right bka, no edema lle Nf, ox 3 Right arm avf  Dialysis: [facility] 4h  500/800  105.5kg  2k/3calc bath  p4  R avf   Hep 10000 -parsabiv 5 mg tiw iv -calcitriol 0.25 ug tiw po      Impression: 1  R foot gangrene sp bka 4/26 dr duda, wbc coming down not on any abx 2  esrd hd mwf 3  Htn/ vol will lower dry wt today is poss 4  Anemia ckd / abl from surg not on esa 5  mbd ckd cont vdra/ velphoro/ phoslo 6  diab type 1 per primary   Plan - dialysis today and lower dry wt approp for bka   [name] MD [facility] pager [phone]   06/09/2017, 12:06 PM   Recent Labs  Lab 06/06/17 0812 06/07/17 1142 06/09/17 0737  NA 131* 130* 136   K 3.8 3.7 3.2*  CL 90* 90* 97*  CO2 21* 27 23  GLUCOSE 280* 354* 76  BUN 23* 49* 68*  CREATININE 7.08* 9.62* 13.86*  CALCIUM 8.6* 7.8* 8.1*  PHOS  --   --  3.8   Recent Labs  Lab 06/09/17 0737  ALBUMIN 2.0*   Recent Labs  Lab 06/06/17 0812 06/07/17 1142 06/09/17 0737  WBC 25.0* 22.9* 19.8*  HGB 11.5* 10.9* 10.9*  HCT 34.0* 32.2* 31.7*  MCV 100.6* 98.5 97.8  PLT 489* 561* 640*   Iron/TIBC/Ferritin/ %Sat    Component Value Date/Time   IRON 22 (L) 12/04/2015 1230   TIBC 139 (L) 12/04/2015 1230   IRONPCTSAT 16 (L) 12/04/2015 1230

## 2017-06-10 ENCOUNTER — Other Ambulatory Visit: Payer: Self-pay

## 2017-06-10 ENCOUNTER — Inpatient Hospital Stay (HOSPITAL_COMMUNITY)
Admission: RE | Admit: 2017-06-10 | Discharge: 2017-06-18 | DRG: 559 | Disposition: A | Discharge status: Home Health | Payer: BLUE CROSS/BLUE SHIELD | Source: Intra-hospital | Attending: Physical Medicine & Rehabilitation | Admitting: Physical Medicine & Rehabilitation

## 2017-06-10 ENCOUNTER — Encounter (HOSPITAL_COMMUNITY): Payer: Self-pay | Admitting: *Deleted

## 2017-06-10 DIAGNOSIS — Z992 Dependence on renal dialysis: Secondary | ICD-10-CM

## 2017-06-10 DIAGNOSIS — E10649 Type 1 diabetes mellitus with hypoglycemia without coma: Secondary | ICD-10-CM | POA: Diagnosis not present

## 2017-06-10 DIAGNOSIS — E669 Obesity, unspecified: Secondary | ICD-10-CM | POA: Diagnosis present

## 2017-06-10 DIAGNOSIS — K219 Gastro-esophageal reflux disease without esophagitis: Secondary | ICD-10-CM | POA: Diagnosis not present

## 2017-06-10 DIAGNOSIS — Z79891 Long term (current) use of opiate analgesic: Secondary | ICD-10-CM | POA: Diagnosis not present

## 2017-06-10 DIAGNOSIS — K5900 Constipation, unspecified: Secondary | ICD-10-CM | POA: Diagnosis present

## 2017-06-10 DIAGNOSIS — R0989 Other specified symptoms and signs involving the circulatory and respiratory systems: Secondary | ICD-10-CM

## 2017-06-10 DIAGNOSIS — S88111S Complete traumatic amputation at level between knee and ankle, right lower leg, sequela: Secondary | ICD-10-CM | POA: Diagnosis not present

## 2017-06-10 DIAGNOSIS — S88111A Complete traumatic amputation at level between knee and ankle, right lower leg, initial encounter: Secondary | ICD-10-CM

## 2017-06-10 DIAGNOSIS — Z794 Long term (current) use of insulin: Secondary | ICD-10-CM | POA: Diagnosis not present

## 2017-06-10 DIAGNOSIS — I5032 Chronic diastolic (congestive) heart failure: Secondary | ICD-10-CM | POA: Diagnosis not present

## 2017-06-10 DIAGNOSIS — Z8701 Personal history of pneumonia (recurrent): Secondary | ICD-10-CM

## 2017-06-10 DIAGNOSIS — Z79899 Other long term (current) drug therapy: Secondary | ICD-10-CM

## 2017-06-10 DIAGNOSIS — I10 Essential (primary) hypertension: Secondary | ICD-10-CM | POA: Diagnosis not present

## 2017-06-10 DIAGNOSIS — R112 Nausea with vomiting, unspecified: Secondary | ICD-10-CM | POA: Diagnosis not present

## 2017-06-10 DIAGNOSIS — Z8249 Family history of ischemic heart disease and other diseases of the circulatory system: Secondary | ICD-10-CM

## 2017-06-10 DIAGNOSIS — E1142 Type 2 diabetes mellitus with diabetic polyneuropathy: Secondary | ICD-10-CM

## 2017-06-10 DIAGNOSIS — R7309 Other abnormal glucose: Secondary | ICD-10-CM | POA: Diagnosis not present

## 2017-06-10 DIAGNOSIS — Z716 Tobacco abuse counseling: Secondary | ICD-10-CM

## 2017-06-10 DIAGNOSIS — E1165 Type 2 diabetes mellitus with hyperglycemia: Secondary | ICD-10-CM | POA: Diagnosis not present

## 2017-06-10 DIAGNOSIS — N2581 Secondary hyperparathyroidism of renal origin: Secondary | ICD-10-CM | POA: Diagnosis present

## 2017-06-10 DIAGNOSIS — E785 Hyperlipidemia, unspecified: Secondary | ICD-10-CM | POA: Diagnosis present

## 2017-06-10 DIAGNOSIS — I132 Hypertensive heart and chronic kidney disease with heart failure and with stage 5 chronic kidney disease, or end stage renal disease: Secondary | ICD-10-CM | POA: Diagnosis present

## 2017-06-10 DIAGNOSIS — Z89511 Acquired absence of right leg below knee: Secondary | ICD-10-CM | POA: Diagnosis not present

## 2017-06-10 DIAGNOSIS — Z6828 Body mass index (BMI) 28.0-28.9, adult: Secondary | ICD-10-CM

## 2017-06-10 DIAGNOSIS — Z4781 Encounter for orthopedic aftercare following surgical amputation: Secondary | ICD-10-CM | POA: Diagnosis not present

## 2017-06-10 DIAGNOSIS — Z96652 Presence of left artificial knee joint: Secondary | ICD-10-CM | POA: Diagnosis present

## 2017-06-10 DIAGNOSIS — N186 End stage renal disease: Secondary | ICD-10-CM | POA: Diagnosis not present

## 2017-06-10 DIAGNOSIS — Z888 Allergy status to other drugs, medicaments and biological substances status: Secondary | ICD-10-CM | POA: Diagnosis not present

## 2017-06-10 DIAGNOSIS — E1042 Type 1 diabetes mellitus with diabetic polyneuropathy: Secondary | ICD-10-CM | POA: Diagnosis present

## 2017-06-10 DIAGNOSIS — I471 Supraventricular tachycardia: Secondary | ICD-10-CM | POA: Diagnosis present

## 2017-06-10 DIAGNOSIS — E1022 Type 1 diabetes mellitus with diabetic chronic kidney disease: Secondary | ICD-10-CM | POA: Diagnosis present

## 2017-06-10 DIAGNOSIS — M19011 Primary osteoarthritis, right shoulder: Secondary | ICD-10-CM | POA: Diagnosis present

## 2017-06-10 DIAGNOSIS — R1110 Vomiting, unspecified: Secondary | ICD-10-CM

## 2017-06-10 DIAGNOSIS — I350 Nonrheumatic aortic (valve) stenosis: Secondary | ICD-10-CM | POA: Diagnosis not present

## 2017-06-10 DIAGNOSIS — R269 Unspecified abnormalities of gait and mobility: Secondary | ICD-10-CM

## 2017-06-10 DIAGNOSIS — G546 Phantom limb syndrome with pain: Secondary | ICD-10-CM | POA: Diagnosis present

## 2017-06-10 DIAGNOSIS — Z87891 Personal history of nicotine dependence: Secondary | ICD-10-CM

## 2017-06-10 DIAGNOSIS — D631 Anemia in chronic kidney disease: Secondary | ICD-10-CM | POA: Diagnosis present

## 2017-06-10 DIAGNOSIS — D72829 Elevated white blood cell count, unspecified: Secondary | ICD-10-CM | POA: Diagnosis not present

## 2017-06-10 DIAGNOSIS — I120 Hypertensive chronic kidney disease with stage 5 chronic kidney disease or end stage renal disease: Secondary | ICD-10-CM | POA: Diagnosis not present

## 2017-06-10 DIAGNOSIS — Z833 Family history of diabetes mellitus: Secondary | ICD-10-CM

## 2017-06-10 LAB — CBC
HEMATOCRIT: 31.8 % — AB (ref 39.0–52.0)
Hemoglobin: 10.4 g/dL — ABNORMAL LOW (ref 13.0–17.0)
MCH: 32.8 pg (ref 26.0–34.0)
MCHC: 32.7 g/dL (ref 30.0–36.0)
MCV: 100.3 fL — AB (ref 78.0–100.0)
Platelets: 543 10*3/uL — ABNORMAL HIGH (ref 150–400)
RBC: 3.17 MIL/uL — ABNORMAL LOW (ref 4.22–5.81)
RDW: 15 % (ref 11.5–15.5)
WBC: 20.8 10*3/uL — ABNORMAL HIGH (ref 4.0–10.5)

## 2017-06-10 LAB — GLUCOSE, CAPILLARY
GLUCOSE-CAPILLARY: 226 mg/dL — AB (ref 65–99)
Glucose-Capillary: 223 mg/dL — ABNORMAL HIGH (ref 65–99)
Glucose-Capillary: 191 mg/dL — ABNORMAL HIGH (ref 65–99)
Glucose-Capillary: 269 mg/dL — ABNORMAL HIGH (ref 65–99)
Glucose-Capillary: 191 mg/dL — ABNORMAL HIGH (ref 65–99)

## 2017-06-10 MED ORDER — SUCROFERRIC OXYHYDROXIDE 500 MG PO CHEW
1500.0000 mg | CHEWABLE_TABLET | Freq: Three times a day (TID) | ORAL | Status: DC
  Administered 2017-06-10 – 2017-06-18 (×21): 1500 mg via ORAL
  Filled 2017-06-10 (×25): qty 3

## 2017-06-10 MED ORDER — GABAPENTIN 300 MG PO CAPS
300.0000 mg | ORAL_CAPSULE | Freq: Three times a day (TID) | ORAL | Status: DC
  Administered 2017-06-10 – 2017-06-11 (×4): 300 mg via ORAL
  Filled 2017-06-10 (×5): qty 1

## 2017-06-10 MED ORDER — SORBITOL 70 % SOLN: 30.0000 mL | Freq: Every day | Status: DC | PRN

## 2017-06-10 MED ORDER — LISINOPRIL 20 MG PO TABS
20.0000 mg | ORAL_TABLET | Freq: Every day | ORAL | Status: DC
  Administered 2017-06-10: 20 mg via ORAL
  Filled 2017-06-10: qty 1

## 2017-06-10 MED ORDER — INSULIN GLARGINE 100 UNIT/ML ~~LOC~~ SOLN
35.0000 [IU] | Freq: Every day | SUBCUTANEOUS | Status: DC
  Administered 2017-06-10 – 2017-06-13 (×4): 35 [IU] via SUBCUTANEOUS
  Filled 2017-06-10 (×4): qty 0.35

## 2017-06-10 MED ORDER — INSULIN ASPART 100 UNIT/ML ~~LOC~~ SOLN: 0.0000 [IU] | Freq: Three times a day (TID) | SUBCUTANEOUS | 11 refills | Status: DC

## 2017-06-10 MED ORDER — GABAPENTIN 300 MG PO CAPS: 300.0000 mg | ORAL_CAPSULE | Freq: Three times a day (TID) | ORAL | 0 refills | Status: DC

## 2017-06-10 MED ORDER — METHOCARBAMOL 500 MG PO TABS
500.0000 mg | ORAL_TABLET | Freq: Four times a day (QID) | ORAL | Status: DC | PRN
  Administered 2017-06-11 – 2017-06-17 (×6): 500 mg via ORAL
  Filled 2017-06-10 (×6): qty 1

## 2017-06-10 MED ORDER — INSULIN ASPART 100 UNIT/ML ~~LOC~~ SOLN
0.0000 [IU] | Freq: Three times a day (TID) | SUBCUTANEOUS | Status: DC
  Administered 2017-06-10: 5 [IU] via SUBCUTANEOUS
  Administered 2017-06-11: 2 [IU] via SUBCUTANEOUS
  Administered 2017-06-11: 1 [IU] via SUBCUTANEOUS
  Administered 2017-06-12: 3 [IU] via SUBCUTANEOUS
  Administered 2017-06-13: 1 [IU] via SUBCUTANEOUS

## 2017-06-10 MED ORDER — OXYCODONE HCL 5 MG PO TABS
5.0000 mg | ORAL_TABLET | ORAL | Status: DC | PRN
  Administered 2017-06-11: 5 mg via ORAL
  Administered 2017-06-12 – 2017-06-17 (×15): 10 mg via ORAL
  Filled 2017-06-10 (×5): qty 2
  Filled 2017-06-10: qty 1
  Filled 2017-06-10 (×12): qty 2

## 2017-06-10 MED ORDER — POLYETHYLENE GLYCOL 3350 17 G PO PACK
17.0000 g | PACK | Freq: Every day | ORAL | Status: DC | PRN
  Administered 2017-06-12: 17 g via ORAL
  Filled 2017-06-10: qty 1

## 2017-06-10 MED ORDER — INSULIN GLARGINE 100 UNIT/ML ~~LOC~~ SOLN: 35.0000 [IU] | Freq: Every day | SUBCUTANEOUS | 11 refills | Status: DC

## 2017-06-10 MED ORDER — CALCITRIOL 0.25 MCG PO CAPS
0.2500 ug | ORAL_CAPSULE | ORAL | Status: DC
  Administered 2017-06-13 – 2017-06-18 (×3): 0.25 ug via ORAL
  Filled 2017-06-10 (×2): qty 1

## 2017-06-10 MED ORDER — ACETAMINOPHEN 325 MG PO TABS: 325.0000 mg | ORAL_TABLET | Freq: Four times a day (QID) | ORAL | Status: DC | PRN

## 2017-06-10 MED ORDER — ASPIRIN EC 325 MG PO TBEC
325.0000 mg | DELAYED_RELEASE_TABLET | Freq: Every day | ORAL | Status: DC
  Administered 2017-06-11 – 2017-06-18 (×8): 325 mg via ORAL
  Filled 2017-06-10 (×8): qty 1

## 2017-06-10 MED ORDER — METHOCARBAMOL 1000 MG/10ML IJ SOLN
500.0000 mg | Freq: Four times a day (QID) | INTRAVENOUS | Status: DC | PRN
  Filled 2017-06-10: qty 5

## 2017-06-10 MED ORDER — INSULIN ASPART 100 UNIT/ML ~~LOC~~ SOLN
3.0000 [IU] | Freq: Three times a day (TID) | SUBCUTANEOUS | Status: DC
  Administered 2017-06-10 – 2017-06-17 (×16): 3 [IU] via SUBCUTANEOUS

## 2017-06-10 MED ORDER — OXYCODONE HCL 10 MG PO TABS: 10.0000 mg | ORAL_TABLET | ORAL | 0 refills | Status: DC | PRN

## 2017-06-10 MED ORDER — DOCUSATE SODIUM 100 MG PO CAPS
100.0000 mg | ORAL_CAPSULE | Freq: Two times a day (BID) | ORAL | Status: DC
  Administered 2017-06-10 – 2017-06-17 (×15): 100 mg via ORAL
  Filled 2017-06-10 (×15): qty 1

## 2017-06-10 MED ORDER — ONDANSETRON HCL 4 MG/2ML IJ SOLN: 4.0000 mg | Freq: Four times a day (QID) | INTRAMUSCULAR | Status: DC | PRN

## 2017-06-10 MED ORDER — ONDANSETRON HCL 4 MG/2ML IJ SOLN
4.0000 mg | Freq: Four times a day (QID) | INTRAMUSCULAR | Status: DC | PRN
  Administered 2017-06-11: 4 mg via INTRAVENOUS
  Filled 2017-06-10 (×2): qty 2

## 2017-06-10 MED ORDER — BISACODYL 10 MG RE SUPP: 10.0000 mg | Freq: Every day | RECTAL | Status: DC | PRN

## 2017-06-10 MED ORDER — ONDANSETRON HCL 4 MG PO TABS: 4.0000 mg | ORAL_TABLET | Freq: Four times a day (QID) | ORAL | Status: DC | PRN

## 2017-06-10 MED ORDER — CARVEDILOL 3.125 MG PO TABS
3.1250 mg | ORAL_TABLET | Freq: Two times a day (BID) | ORAL | Status: DC
  Administered 2017-06-10 – 2017-06-17 (×12): 3.125 mg via ORAL
  Filled 2017-06-10 (×13): qty 1

## 2017-06-10 MED ORDER — CALCIUM ACETATE (PHOS BINDER) 667 MG PO CAPS
667.0000 mg | ORAL_CAPSULE | Freq: Three times a day (TID) | ORAL | Status: DC
  Administered 2017-06-10 – 2017-06-18 (×21): 667 mg via ORAL
  Filled 2017-06-10 (×21): qty 1

## 2017-06-10 MED ORDER — ONDANSETRON HCL 4 MG PO TABS
4.0000 mg | ORAL_TABLET | Freq: Four times a day (QID) | ORAL | Status: DC | PRN
  Administered 2017-06-11 – 2017-06-15 (×2): 4 mg via ORAL
  Filled 2017-06-10 (×2): qty 1

## 2017-06-10 NOTE — Progress Notes (Signed)
Report given to [facility].  Patient was transferred to 4MW #6 via bed.

## 2017-06-10 NOTE — Progress Notes (Signed)
Pt admitted to 4M06. Family not at bedside at time of transfer. Pt alert and oriented and complains of no pain. Continue plan of care.

## 2017-06-10 NOTE — Progress Notes (Signed)
Patient ID: Lance [name], male   DOB: 1970/11/15, 47 y.o.   MRN: [id number] Patient with wound VAC and stump shrinker and stump protector in place.  No drainage in the wound VAC canister.  Anticipate discharge to inpatient rehabilitation.  Paperwork completed in case transfer occurs today.  Patient's morning CBG is in the low 200s he is on 35 units of Lantus.

## 2017-06-10 NOTE — H&P (Signed)
Physical Medicine and Rehabilitation Admission H&P    Chief complaint: Stump pain  HPI: [name] is a 47 year old right-handed male with history of end-stage renal disease with hemodialysis, diastolic congestive heart failure, PSVT, diabetes mellitus, tobacco abuse as well as peripheral vascular disease.  He lives with his wife and 90 year old daughter.  They have a 51 year old son who is in college.  One level home with planned ramped entrance.  Wife works during the day.  He does have a mother close by who can assist.  Presented 06/06/2017 with gangrenous changes of the right foot.  Recent vascular studies by Dr. Trula Slade showed no revascularization options.  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 Sharol Given.  Wound VAC applied.  Hospital course pain management.  Hemodialysis ongoing as per renal services.  Acute on chronic anemia 10.4 and monitored.  Leukocytosis 20,800.  Physical and occupational therapy evaluations completed with recommendations of physical medicine rehab consult.  Patient was admitted for a comprehensive rehab program  Review of Systems  Constitutional: Negative for chills, diaphoresis and fever.  HENT: Negative for hearing loss and tinnitus.   Eyes: Negative for blurred vision and double vision.  Cardiovascular: Positive for leg swelling. Negative for chest pain and palpitations.  Gastrointestinal: Positive for constipation. Negative for nausea and vomiting.  Genitourinary: Negative for hematuria.  Musculoskeletal: Positive for joint pain and myalgias.  Neurological: Negative for seizures.  All other systems reviewed and are negative.      Past Medical History:  Diagnosis Date  . Aortic stenosis    Very mild in 05/2007  . Arthritis    "right shoulder; right knee; feel like I've got it all over" (11/28/2015)  . CHF (congestive heart failure) ([place])   . Degenerative joint disease    Left TKA  . Diastolic heart failure (HCC)     LVEF 65-70% with grade 2 diastolic dysfunction  . ESRD (end stage renal disease) on dialysis ([place])    "MWF; [facility] on [address]" (11/28/2015)  . Essential hypertension, benign 2000   LVH  . GERD (gastroesophageal reflux disease)   . HCAP (healthcare-associated pneumonia) 11/28/2015  . Hyperlipidemia 2000  . Loculated pleural effusion 11/28/2015   [name] 11/28/2015  . Pneumonia 12/2013; 05/2014  . PSVT (paroxysmal supraventricular tachycardia) (HCC)    Nonsustained; asymptomatic; diagnosed by event recorder in 2006  . Tobacco abuse, in remission    20 pack years; discontinued in 1985; bronchitic changes on chest x-ray and 2009  . Type 1 diabetes mellitus ([place]) 1990        Past Surgical History:  Procedure Laterality Date  . AMPUTATION Right 06/06/2017   Procedure: RIGHT BELOW KNEE AMPUTATION;  Surgeon: [name], MD;  Location: [facility];  Service: Orthopedics;  Laterality: Right;  . AV FISTULA PLACEMENT Right 06/23/2014   Procedure: ARTERIOVENOUS (AV) FISTULA CREATION;  Surgeon: Angelia Mould, MD;  Location: [facility];  Service: Vascular;  Laterality: Right;  . CARDIAC CATHETERIZATION     [facility]- evaluation for Kidney/ pancreas transplant  . EYE SURGERY Bilateral    "for glaucoma"  . INGUINAL HERNIA REPAIR Right    As a child  . LOWER EXTREMITY ANGIOGRAPHY N/A 05/27/2017   Procedure: LOWER EXTREMITY ANGIOGRAPHY;  Surgeon: [name], MD;  Location: [facility];  Service: Cardiovascular;  Laterality: N/A;  . WISDOM TOOTH EXTRACTION  1987        Family History  Problem Relation Age of Onset  . Diabetes Father   .  Hypertension Father   . Diabetes Mother   . Hypertension Mother    Social History:  reports that he has never smoked. He has never used smokeless tobacco. He reports that he drinks alcohol. He reports that he does not use drugs. Allergies:       Allergies  Allergen Reactions  . Atorvastatin Other (See Comments)     Myalgias   . Minoxidil Other (See Comments)    Fluid retention    Medications Prior to Admission  Medication Sig Dispense Refill  . calcium acetate (PHOSLO) 667 MG capsule Take 1 capsule by mouth 3 (three) times daily.  10  . carvedilol (COREG CR) 10 MG 24 hr capsule Take 5 mg by mouth 2 (two) times daily.     . Continuous Blood Gluc Sensor (FREESTYLE LIBRE SENSOR SYSTEM) MISC Use one sensor every 10 days. 3 each 2  . HUMALOG KWIKPEN 100 UNIT/ML KiwkPen Inject 12-18 Units into the skin 3 (three) times daily before meals.  0  . HYDROcodone-acetaminophen (NORCO/VICODIN) 5-325 MG tablet Take 1 tablet by mouth every 6 (six) hours as needed for moderate pain. 20 tablet 0  . LANTUS 100 UNIT/ML injection Inject 30 Units into the skin at bedtime.  10  . lisinopril (PRINIVIL,ZESTRIL) 20 MG tablet Take 20 mg by mouth at bedtime.     . ranitidine (ZANTAC) 75 MG tablet Take 75 mg by mouth daily as needed for heartburn.    . sucroferric oxyhydroxide (VELPHORO) 500 MG chewable tablet Chew 3 tablets by mouth 3 (three) times daily.      Drug Regimen Review Drug regimen was reviewed and remains appropriate with no significant issues identified  Home: Home Living Family/patient expects to be discharged to:: Private residence Living Arrangements: Alone Available Help at Discharge: Family, Friend(s), Available PRN/intermittently, Other (Comment)(will only be alone for ~2 hrs) Type of Home: House Home Access: Stairs to enter, Other (comment)(working on getting a ramp) Entrance Stairs-Number of Steps: 5 Entrance Stairs-Rails: Right, Left Home Layout: Able to live on main level with bedroom/bathroom Bathroom Shower/Tub: Walk-in shower Home Equipment: Crutches   Functional History: Prior Function Level of Independence: Independent  Functional Status:  Mobility: Bed Mobility Overal bed mobility: Needs Assistance Bed Mobility: Supine to Sit Supine to sit: Supervision Sit to  supine: Supervision General bed mobility comments: supine to sit with increased time Transfers Overall transfer level: Needs assistance Equipment used: Rolling walker (2 wheeled) Transfers: Sit to/from Stand Sit to Stand: Mod assist, +2 safety/equipment General transfer comment: min verbal cueing for hand placement and scooting forward to edge. Moderate assistance to boost up and steady.  Ambulation/Gait Ambulation/Gait assistance: Min guard Ambulation Distance (Feet): 30 Feet Assistive device: Rolling walker (2 wheeled) General Gait Details: Hopped 30 feet (15x15) with RW; chair follow utilized.  ADL:  Cognition: Cognition Overall Cognitive Status: Within Functional Limits for tasks assessed Orientation Level: Oriented X4 Cognition Arousal/Alertness: Awake/alert Behavior During Therapy: WFL for tasks assessed/performed Overall Cognitive Status: Within Functional Limits for tasks assessed  Physical Exam: Blood pressure 117/71, pulse 93, temperature 99 F (37.2 C), temperature source Oral, resp. rate 20, height '6\' 3"'  (1.905 m), weight 106.2 kg (234 lb 2.1 oz), SpO2 97 %. Physical Exam  Vitals reviewed. Constitutional: He is oriented to person, place, and time. He appears well-developed.  obese  HENT:  Head: Normocephalic.  Eyes: EOM are normal. Right eye exhibits no discharge. Left eye exhibits no discharge.  Neck: Normal range of motion. Neck supple. No thyromegaly present.  Cardiovascular:  Normal rate, regular rhythm and normal heart sounds.  Respiratory: Effort normal and breath sounds normal. No respiratory distress.  GI: Soft. Bowel sounds are normal. He exhibits no distension.  Musculoskeletal: He exhibits edema.  Right leg tender to palpation and ROM  Neurological: He is alert and oriented to person, place, and time. He has normal reflexes. No cranial nerve deficit.  UE 5/5 prox to distal. RLE limited due to pain. LLE 4/5.   Skin:  Right BK in limb guard. Vac  attached to wound, minimal drainage through cannister.   Psychiatric: He has a normal mood and affect. His behavior is normal.      LabResultsLast48Hours        Results for orders placed or performed during the hospital encounter of 06/06/17 (from the past 48 hour(s))  Glucose, capillary     Status: Abnormal   Collection Time: 06/08/17 11:17 AM  Result Value Ref Range   Glucose-Capillary 219 (H) 65 - 99 mg/dL  Glucose, capillary     Status: Abnormal   Collection Time: 06/08/17  4:43 PM  Result Value Ref Range   Glucose-Capillary 130 (H) 65 - 99 mg/dL  Glucose, capillary     Status: Abnormal   Collection Time: 06/08/17  8:33 PM  Result Value Ref Range   Glucose-Capillary 115 (H) 65 - 99 mg/dL  Renal function panel     Status: Abnormal   Collection Time: 06/09/17  7:37 AM  Result Value Ref Range   Sodium 136 135 - 145 mmol/L   Potassium 3.2 (L) 3.5 - 5.1 mmol/L   Chloride 97 (L) 101 - 111 mmol/L   CO2 23 22 - 32 mmol/L   Glucose, Bld 76 65 - 99 mg/dL   BUN 68 (H) 6 - 20 mg/dL   Creatinine, Ser 13.86 (H) 0.61 - 1.24 mg/dL   Calcium 8.1 (L) 8.9 - 10.3 mg/dL   Phosphorus 3.8 2.5 - 4.6 mg/dL   Albumin 2.0 (L) 3.5 - 5.0 g/dL   GFR calc non Af Amer 4 (L) >60 mL/min   GFR calc Af Amer 4 (L) >60 mL/min    Comment: (NOTE) The eGFR has been calculated using the CKD EPI equation. This calculation has not been validated in all clinical situations. eGFR's persistently <60 mL/min signify possible Chronic Kidney Disease.    Anion gap 16 (H) 5 - 15    Comment: Performed at [facility], [place] [address] [place], [place] [address]  CBC     Status: Abnormal   Collection Time: 06/09/17  7:37 AM  Result Value Ref Range   WBC 19.8 (H) 4.0 - 10.5 K/uL   RBC 3.24 (L) 4.22 - 5.81 MIL/uL   Hemoglobin 10.9 (L) 13.0 - 17.0 g/dL   HCT 31.7 (L) 39.0 - 52.0 %   MCV 97.8 78.0 - 100.0 fL   MCH 33.6 26.0 - 34.0 pg   MCHC 34.4 30.0 - 36.0 g/dL   RDW 15.0  11.5 - 15.5 %   Platelets 640 (H) 150 - 400 K/uL    Comment: Performed at [facility], [place] [address], [place], [place] [address]  Glucose, capillary     Status: Abnormal   Collection Time: 06/09/17 12:14 PM  Result Value Ref Range   Glucose-Capillary 108 (H) 65 - 99 mg/dL  Glucose, capillary     Status: Abnormal   Collection Time: 06/09/17  4:26 PM  Result Value Ref Range   Glucose-Capillary 298 (H) 65 - 99 mg/dL  Glucose, capillary     Status: Abnormal   Collection Time: 06/09/17  9:39 PM  Result Value Ref Range   Glucose-Capillary 236 (H) 65 - 99 mg/dL  CBC     Status: Abnormal   Collection Time: 06/10/17  3:07 AM  Result Value Ref Range   WBC 20.8 (H) 4.0 - 10.5 K/uL   RBC 3.17 (L) 4.22 - 5.81 MIL/uL   Hemoglobin 10.4 (L) 13.0 - 17.0 g/dL   HCT 31.8 (L) 39.0 - 52.0 %   MCV 100.3 (H) 78.0 - 100.0 fL   MCH 32.8 26.0 - 34.0 pg   MCHC 32.7 30.0 - 36.0 g/dL   RDW 15.0 11.5 - 15.5 %   Platelets 543 (H) 150 - 400 K/uL    Comment: Performed at [facility], [address], [place], [place] [address]  Glucose, capillary     Status: Abnormal   Collection Time: 06/10/17  4:03 AM  Result Value Ref Range   Glucose-Capillary 223 (H) 65 - 99 mg/dL  Glucose, capillary     Status: Abnormal   Collection Time: 06/10/17  6:46 AM  Result Value Ref Range   Glucose-Capillary 226 (H) 65 - 99 mg/dL     ImagingResults(Last48hours)  No results found.       Medical Problem List and Plan: 1.  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  Wound VAC x1 week             -admit to inpatient rehab 2.  DVT Prophylaxis/Anticoagulation: SCDs left lower extremity 3. Pain Management: Neurontin 300 mg 3 times daily, Robaxin and oxycodone as needed 4. Mood: Provide emotional support 5. Neuropsych: This patient is capable of making decisions on his own behalf. 6. Skin/Wound Care: Routine skin checks 7.  Fluids/Electrolytes/Nutrition: Routine I and Os with follow-up chemistries 8.  End-stage renal disease.  Continue hemodialysis as directed             -HD MWF after therapy day to allow for participation in therapies 9.  Acute on chronic anemia.  Follow-up CBC 10.  Hypertension.  Coreg 3.125 mg twice daily, lisinopril 20 mg daily 11.  Diastolic congestive heart failure.  Monitor for any signs of fluid overload 12,.  Diabetes mellitus with peripheral neuropathy.  Hemoglobin A1c 9.1.  Lantus insulin 35 units nightly, NovoLog 3 units 3 times daily.  Check blood sugars before meals and at bedtime.  Diabetic teaching 13.  Tobacco abuse.  Counseling 14.  Constipation.  Laxative assistance  Post Admission Physician Evaluation: 1. Functional deficits secondary  to right BKA. 2. Patient is admitted to receive collaborative, interdisciplinary care between the physiatrist, rehab nursing staff, and therapy team. 3. Patient's level of medical complexity and substantial therapy needs in context of that medical necessity cannot be provided at a lesser intensity of care such as a SNF. 4. Patient has experienced substantial functional loss from his/her baseline which was documented above under the "Functional History" and "Functional Status" headings.  Judging by the patient's diagnosis, physical exam, and functional history, the patient has potential for functional progress which will result in measurable gains while on inpatient rehab.  These gains will be of substantial and practical use upon discharge  in facilitating mobility and self-care at the household level. 5. Physiatrist will provide 24 hour management of medical needs as well as oversight of the therapy plan/treatment and provide guidance as appropriate regarding the interaction of the two. 6. The Preadmission Screening has been reviewed  and patient status is unchanged unless otherwise stated above. 7. 24 hour rehab nursing will assist with bladder  management, bowel management, safety, skin/wound care, disease management, medication administration, pain management and patient education  and help integrate therapy concepts, techniques,education, etc. 8. PT will assess and treat for/with: Lower extremity strength, range of motion, stamina, balance, functional mobility, safety, adaptive techniques and equipment, pre-prosthetic education.   Goals are: mod I. 9. OT will assess and treat for/with: ADL's, functional mobility, safety, upper extremity strength, adaptive techniques and equipment, pain mgt, ego support, community reentry.   Goals are: mod I. Therapy may not yet proceed with showering this patient. 10. SLP will assess and treat for/with: n/a.  Goals are: n/a. 11. Case Management and Social Worker will assess and treat for psychological issues and discharge planning. 12. Team conference will be held weekly to assess progress toward goals and to determine barriers to discharge. 13. Patient will receive at least 3 hours of therapy per day at least 5 days per week. 14. ELOS: 5-7 days       15. Prognosis:  excellent    I have personally performed a face to face diagnostic evaluation of this patient and formulated the key components of the plan.  Additionally, I have personally reviewed laboratory data, imaging studies, as well as relevant notes and concur with the physician assistant's documentation above.  Meredith Staggers, MD, FAAPMR   Lavon Paganini Maili, PA-C 06/10/2017

## 2017-06-10 NOTE — Discharge Summary (Signed)
Discharge Diagnoses:  Active Problems:   Gangrene of right foot ([place])   History of right below knee amputation (HCC)   Unilateral complete BKA, right, initial encounter ([place])   ESRD on dialysis ([place])   Chronic diastolic congestive heart failure ([place])   History of PSVT (paroxysmal supraventricular tachycardia)   Diabetes mellitus type 2 in nonobese [facility])   Post-operative pain   Acute blood loss anemia   Anemia of chronic disease   Leukocytosis   Tachycardia   Surgeries: Procedure(s): RIGHT BELOW KNEE AMPUTATION on 06/06/2017    Consultants: Treatment Team:  Estanislado Emms, MD Roney Jaffe, MD  Discharged Condition: Improved  Hospital Course: Lance [name] is an 47 y.o. male who was admitted 06/06/2017 with a chief complaint of gangrene right foot, with a final diagnosis of Gangrene Right Foot.  Patient was brought to the operating room on 06/06/2017 and underwent Procedure(s): RIGHT BELOW KNEE AMPUTATION.    Patient was given perioperative antibiotics:  Anti-infectives (From admission, onward)   Start     Dose/Rate Route Frequency Ordered Stop   06/06/17 1130  ceFAZolin (ANCEF) IVPB 1 g/50 mL premix  Status:  Discontinued     1 g 100 mL/hr over 30 Minutes Intravenous Every 12 hours 06/06/17 1127 06/06/17 1523   06/06/17 0748  ceFAZolin (ANCEF) 2-4 GM/100ML-% IVPB    Note to Pharmacy:  Tamsen Snider   : cabinet override      06/06/17 0748 06/06/17 1033   06/06/17 0745  ceFAZolin (ANCEF) IVPB 2g/100 mL premix     2 g 200 mL/hr over 30 Minutes Intravenous On call to O.R. 06/06/17 1610 06/06/17 1048    .  Patient was given sequential compression devices, early ambulation, and aspirin for DVT prophylaxis.  Recent vital signs:  Patient Vitals for the past 24 hrs:  BP Temp Temp src Pulse Resp SpO2 Weight  06/10/17 0402 117/71 99 F (37.2 C) Oral 93 20 97 % -  06/09/17 1728 121/70 - - 100 - 100 % -  06/09/17 1223 121/70 - - 100 20 100 % -  06/09/17 1150 119/69  98.2 F (36.8 C) Oral (!) 101 18 100 % -  06/09/17 1130 100/62 - - (!) 101 18 - -  06/09/17 1100 (!) 97/56 - - (!) 101 18 - -  06/09/17 1035 101/69 - - 96 18 - -  06/09/17 1000 92/68 - - 100 18 - -  06/09/17 0930 103/67 - - (!) 103 18 - -  06/09/17 0900 113/77 - - (!) 111 20 - -  06/09/17 0830 (!) 113/46 - - (!) 103 20 - -  06/09/17 0825 120/72 - - (!) 103 - - -  06/09/17 0820 (!) 87/46 - - (!) 106 18 - -  06/09/17 0800 140/80 - - (!) 102 18 - -  06/09/17 0730 140/80 - - (!) 101 18 - -  06/09/17 0713 (!) 183/88 - - (!) 105 18 - -  06/09/17 0700 (!) 162/89 98.1 F (36.7 C) Oral 99 18 100 % 234 lb 2.1 oz (106.2 kg)  .  Recent laboratory studies: No results found.  Discharge Medications:   Allergies as of 06/10/2017      Reactions   Atorvastatin Other (See Comments)   Myalgias   Minoxidil Other (See Comments)   Fluid retention      Medication List    STOP taking these medications   [place] 100 UNIT/ML KiwkPen  Generic drug:  insulin lispro   HYDROcodone-acetaminophen 5-325 MG tablet Commonly known as:  NORCO/VICODIN     TAKE these medications   calcium acetate 667 MG capsule Commonly known as:  PHOSLO Take 1 capsule by mouth 3 (three) times daily.   carvedilol 10 MG 24 hr capsule Commonly known as:  COREG CR Take 5 mg by mouth 2 (two) times daily.   gabapentin 300 MG capsule Commonly known as:  NEURONTIN Take 1 capsule (300 mg total) by mouth 3 (three) times daily.   insulin aspart 100 UNIT/ML injection Commonly known as:  novoLOG Inject 0-9 Units into the skin 3 (three) times daily with meals.   insulin glargine 100 UNIT/ML injection Commonly known as:  LANTUS Inject 0.35 mLs (35 Units total) into the skin at bedtime. What changed:  how much to take   lisinopril 20 MG tablet Commonly known as:  PRINIVIL,ZESTRIL Take 20 mg by mouth at bedtime.   Oxycodone HCl 10 MG Tabs Take 1-1.5 tablets (10-15 mg total) by  mouth every 4 (four) hours as needed for severe pain (pain score 7-10).   ranitidine 75 MG tablet Commonly known as:  ZANTAC Take 75 mg by mouth daily as needed for heartburn.   VELPHORO 500 MG chewable tablet Generic drug:  sucroferric oxyhydroxide Chew 3 tablets by mouth 3 (three) times daily.       Diagnostic Studies: Dg Foot Complete Right  Result Date: 05/13/2017 CLINICAL DATA:  RIGHT foot injury, struck RIGHT foot/toe on sidewalk on Thursday, second toe is black and swollen, diabetes mellitus EXAM: RIGHT FOOT COMPLETE - 3+ VIEW COMPARISON:  None FINDINGS: Osseous mineralization normal. Joint spaces preserved. No acute fracture, dislocation, or bone destruction. Soft tissue swelling second toe. Extensive small vessel vascular calcifications consistent with history of diabetes mellitus. IMPRESSION: No acute osseous abnormalities. Electronically Signed   By: Lavonia Dana M.D.   On: 05/13/2017 11:29    Patient benefited maximally from their hospital stay and there were no complications.     Disposition: Discharge disposition: 02-Transferred to [facility]      Discharge Instructions    Call MD / Call 911   Complete by:  As directed    If you experience chest pain or shortness of breath, CALL 911 and be transported to the hospital emergency room.  If you develope a fever above 101 F, pus (white drainage) or increased drainage or redness at the wound, or calf pain, call your surgeon's office.   Constipation Prevention   Complete by:  As directed    Drink plenty of fluids.  Prune juice may be helpful.  You may use a stool softener, such as Colace (over the counter) 100 mg twice a day.  Use MiraLax (over the counter) for constipation as needed.   Diet - low sodium heart healthy   Complete by:  As directed    Increase activity slowly as tolerated   Complete by:  As directed      Follow-up Information    [name], MD In 1 week.   Specialty:  Orthopedic  Surgery Contact information: [place] [address] [phone]            Signed: [name] 06/10/2017, 6:59 AM

## 2017-06-10 NOTE — Progress Notes (Addendum)
Inpatient Rehabilitation  I have receive insurance approval for an IP Rehab admission and have a bed available to offer today.  Plan to update patient shortly and plan to proceed with admission today.  Notified nurse case Freight forwarder.  Call if questions.   Update: Reviewed insurance verification letter with patient, who is in agreement with admission today.  Notified bedside nurse.  Call if questions.   Carmelia Roller., CCC/SLP Admission Coordinator  [facility]  Cell [phone]

## 2017-06-10 NOTE — PMR Pre-admission (Signed)
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 Date: 04/02/15       Deduct: $900      Out of Pocket Max: $3100      Life Max: N/A CIR: 80%/20%      SNF: 80%/20% Outpatient: 90 PT/OT combined visits, 20 SLP visits      Co-Pay: $30-45 per visit  Home Health: 80%      Co-Pay: 20% DME: 80%     Co-Pay: 20% Providers: In-network   SECONDARY: Medicare A & B      Policy#: [id number]      Subscriber: Self CM Name:       Phone#:      Fax#:  Pre-Cert#: Eligible      Employer: Disabled Benefits:  Phone #: Verified online     Name: [employer]  Eff. Date: A:02/12/15 B:09/12/15     Deduct: $1364      Out of Pocket Max:       Life Max:  CIR: Covered per [facility] guidelines         Emergency Contact Information Contact Information    Name Relation Home Work [place] Spouse [phone]  [place] Father [phone]       Current Medical History  Patient Admitting Diagnosis: Right BKA  History of Present Illness: [name] is a 47 year old right-handed male with history of end-stage renal disease with hemodialysis M,W,F, diastolic congestive heart failure, PSVT, diabetes mellitus, tobacco abuse as well as peripheral vascular disease.  He lives with his wife and 37 year old daughter.  They have a 73 year old son who is in college at [employer].  One level home with ramped entrance.  Wife works during the  day.  He does have a mother and father close by who can assist.  Presented 06/06/2017 with gangrenous changes of the right foot.  Recent vascular studies by Dr. Trula Slade showed no revascularization options.  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 Sharol Given.  Wound VAC applied.  Hospital course pain management.  Hemodialysis ongoing as per renal services.  Acute on chronic anemia 10.4 and monitored.  Leukocytosis 20,800.  Physical and occupational therapy evaluations completed with recommendations of physical medicine rehab consult.  Patient was admitted for a comprehensive rehab program 06/10/17.      Past Medical History  Past Medical History:  Diagnosis Date  . Aortic stenosis    Very mild in 05/2007  . Arthritis    "right shoulder; right knee; feel like I've got it all over" (11/28/2015)  . CHF (congestive heart failure) ([place])   . Degenerative joint disease    Left TKA  . Diastolic heart failure ([place])  LVEF 65-70% with grade 2 diastolic dysfunction  . ESRD (end stage renal disease) on dialysis ([place])    "MWF; [facility] on [address]" (11/28/2015)  . Essential hypertension, benign 2000   LVH  . GERD (gastroesophageal reflux disease)   . HCAP (healthcare-associated pneumonia) 11/28/2015  . Hyperlipidemia 2000  . Loculated pleural effusion 11/28/2015   [name] 11/28/2015  . Pneumonia 12/2013; 05/2014  . PSVT (paroxysmal supraventricular tachycardia) ([facility])    Nonsustained; asymptomatic; diagnosed by event recorder in 2006  . Tobacco abuse, in remission    20 pack years; discontinued in 1985; bronchitic changes on chest x-ray and 2009  . Type 1 diabetes mellitus ([place]) 1990    Family History  family history includes Diabetes in his father and mother; Hypertension in his father and mother.  Prior Rehab/Hospitalizations:  Has the patient had major surgery during 100 days prior to admission? No  Current Medications   Current Facility-Administered Medications:  .  0.9 %   sodium chloride infusion, , Intravenous, Continuous, [name], MD, Last Rate: 10 mL/hr at 06/06/17 1629 .  acetaminophen (TYLENOL) tablet 325-650 mg, 325-650 mg, Oral, Q6H PRN, [name], MD, 650 mg at 06/09/17 1715 .  aspirin EC tablet 325 mg, 325 mg, Oral, Daily, [name], MD, 325 mg at 06/10/17 0751 .  bisacodyl (DULCOLAX) suppository 10 mg, 10 mg, Rectal, Daily PRN, [name], MD .  calcitRIOL (ROCALTROL) capsule 0.25 mcg, 0.25 mcg, Oral, Q M,W,F-HD, Alric Seton, PA-C .  calcium acetate (PHOSLO) capsule 667 mg, 667 mg, Oral, TID WC, [name], MD, 667 mg at 06/10/17 0750 .  carvedilol (COREG) tablet 3.125 mg, 3.125 mg, Oral, BID WC, [name], MD, 3.125 mg at 06/10/17 0750 .  docusate sodium (COLACE) capsule 100 mg, 100 mg, Oral, BID, [name], MD, 100 mg at 06/09/17 2145 .  gabapentin (NEURONTIN) capsule 300 mg, 300 mg, Oral, TID, [name], MD, 300 mg at 06/09/17 2145 .  HYDROmorphone (DILAUDID) injection 0.5-1 mg, 0.5-1 mg, Intravenous, Q4H PRN, [name], MD, 0.5 mg at 06/08/17 0550 .  insulin aspart (novoLOG) injection 0-9 Units, 0-9 Units, Subcutaneous, TID WC, [name], MD, 3 Units at 06/10/17 [phone] .  insulin aspart (novoLOG) injection 3 Units, 3 Units, Subcutaneous, TID WC, [name], MD, 3 Units at 06/10/17 [phone] .  insulin glargine (LANTUS) injection 35 Units, 35 Units, Subcutaneous, QHS, [name], MD, 35 Units at 06/09/17 2146 .  lisinopril (PRINIVIL,ZESTRIL) tablet 20 mg, 20 mg, Oral, QHS, [name], MD, 20 mg at 06/09/17 2147 .  magnesium citrate solution 1 Bottle, 1 Bottle, Oral, Once PRN, [name], MD .  methocarbamol (ROBAXIN) tablet 500 mg, 500 mg, Oral, Q6H PRN, 500 mg at 06/09/17 1543 **OR** methocarbamol (ROBAXIN) 500 mg in dextrose 5 % 50 mL IVPB, 500 mg, Intravenous, Q6H PRN, [name], MD .  metoCLOPramide (REGLAN) tablet 5-10 mg, 5-10 mg, Oral, Q8H PRN **OR** metoCLOPramide (REGLAN)  injection 5-10 mg, 5-10 mg, Intravenous, Q8H PRN, [name], MD .  ondansetron (ZOFRAN) tablet 4 mg, 4 mg, Oral, Q6H PRN **OR** ondansetron (ZOFRAN) injection 4 mg, 4 mg, Intravenous, Q6H PRN, [name], MD .  oxyCODONE (Oxy IR/ROXICODONE) immediate release tablet 10-15 mg, 10-15 mg, Oral, Q4H PRN, [name], MD, 10 mg at 06/09/17 2144 .  oxyCODONE (Oxy IR/ROXICODONE) immediate release tablet 5-10 mg, 5-10 mg, Oral, Q4H PRN, [name], MD, 10 mg  at 06/09/17 1016 .  polyethylene glycol (MIRALAX / GLYCOLAX) packet 17 g, 17 g, Oral, Daily PRN, [name], MD .  sucroferric oxyhydroxide [facility]) chewable tablet 1,500 mg, 1,500 mg, Oral, TID WC, [name], MD, 1,500 mg at 06/10/17 8295  Patients Current Diet:  Diet Order           Diet - low sodium heart healthy        Diet renal with fluid restriction Fluid restriction: 1200 mL Fluid; Room service appropriate? Yes; Fluid consistency: Thin  Diet effective now          Precautions / Restrictions Precautions Precautions: Fall Precaution Comments: wound VAC Other Brace/Splint: residual limb protector Restrictions Weight Bearing Restrictions: Yes RLE Weight Bearing: Non weight bearing   Has the patient had 2 or more falls or a fall with injury in the past year?Yes, 1 fall that patient reports has resulted in all this  Prior Activity Level Community (5-7x/wk): Prior to admission patient was out in the community daily, driving himself to HD, and actively following his daughter's traveling basketball team.    Development worker, international aid / [place] Devices/Equipment: CBG Meter Home Equipment: Crutches  Prior Device Use: Indicate devices/aids used by the patient prior to current illness, exacerbation or injury? None of the above  Prior Functional Level Prior Function Level of Independence: Independent  Self Care: Did the patient need help bathing, dressing, using the toilet or eating?  Independent  Indoor Mobility: Did the patient need assistance with walking from room to room (with or without device)? Independent  Stairs: Did the patient need assistance with internal or external stairs (with or without device)? Independent  Functional Cognition: Did the patient need help planning regular tasks such as shopping or remembering to take medications? Independent  Current Functional Level Cognition  Overall Cognitive Status: Impaired/Different from baseline Orientation Level: Oriented X4 Safety/Judgement: Decreased awareness of safety General Comments: pt needs education on fall risk and the reason that he can not transfer without (A) at this time    Extremity Assessment (includes Sensation/Coordination)  Upper Extremity Assessment: RUE deficits/detail, Overall WFL for tasks assessed RUE Deficits / Details: HD port present  Lower Extremity Assessment: Defer to PT evaluation RLE Deficits / Details: pt with residual limb protector in place; MMT revealed 4/5 for hip flexion, hip extension, hip abduction and hip adduction RLE: Unable to fully assess due to immobilization    ADLs  Overall ADL's : Needs assistance/impaired Eating/Feeding: Independent Grooming: Independent Grooming Details (indicate cue type and reason): brushing teeth this session Upper Body Bathing: Set up Lower Body Bathing: Moderate assistance, Sit to/from stand Lower Body Bathing Details (indicate cue type and reason): pt requires total Dependence on RW in static standing. pt able to wash static sitting peri area anterior and thighs. Pt is unable to complete posterior peri Upper Body Dressing : Set up Lower Body Dressing: Maximal assistance Lower Body Dressing Details (indicate cue type and reason): pt able to verbalize the reason for residual limb guard as protect the leg from injury. Pt also educated on knee extension  Toilet Transfer: Minimal assistance, +2 for safety/equipment Toilet Transfer  Details (indicate cue type and reason): pt requires (A) to power up from elevated bed but able to do so. Min v/c for safety with hand placement Toileting - Clothing Manipulation Details (indicate cue type and reason): pt expressed interest to go to the bathroom alone for all transfer but educated on the need to have staff due  to wound vac / fall risk General ADL Comments: pt motivated and willing to get OOB at this time.     Mobility  Overal bed mobility: Needs Assistance Bed Mobility: Supine to Sit Supine to sit: Supervision Sit to supine: Supervision General bed mobility comments: pt requires bed rail on Right side and incr time. Pt with posterior LOB x1 but self corrected    Transfers  Overall transfer level: Needs assistance Equipment used: Rolling walker (2 wheeled) Transfers: Sit to/from Stand Sit to Stand: Min assist, +2 safety/equipment General transfer comment: bed elevated and cues for hand placment    Ambulation / Gait / Stairs / Wheelchair Mobility  Ambulation/Gait Ambulation/Gait assistance: Min guard Ambulation Distance (Feet): 30 Feet Assistive device: Rolling walker (2 wheeled) General Gait Details: Hopped 30 feet (15x15) with RW; chair follow utilized.    Posture / Balance Balance Overall balance assessment: Needs assistance Sitting-balance support: Feet supported Sitting balance-Leahy Scale: Good Standing balance support: Bilateral upper extremity supported, During functional activity Standing balance-Leahy Scale: Poor Standing balance comment: total relience of bil UE on RW in static standing    Special needs/care consideration BiPAP/CPAP: No CPM: No Continuous Drip IV: No Dialysis: Yes        Days: M,W,F Life Vest: No Oxygen: No Special Bed: No Trach Size: No Wound Vac (area): Yes      Location: Right lower extremity  Skin: Dry, Skin tear to right knee                               Bowel mgmt: Continent, last BM 06/08/17 Bladder mgmt: Continent   Diabetic mgmt: Yes, with implanted device in right upper extremity that he would scan and get CBG level. PTA took [place] in the pm and sliding scale am     Previous Home Environment Living Arrangements: Alone Available Help at Discharge: Family, Friend(s), Available PRN/intermittently, Other (Comment)(will only be alone for ~2 hrs) Type of Home: House Home Layout: Able to live on main level with bedroom/bathroom Home Access: Stairs to enter, Other (comment)(working on getting a ramp) Entrance Stairs-Rails: Right, Left Entrance Stairs-Number of Steps: 5 Bathroom Shower/Tub: Walk-in shower Additional Comments: pt travels with daughter to all her [employer] basketball games/ tournments throughout the country. Pt is very active in his daughters sport and very motivated to return to being by her side. Daughter is in the 10 grade with active scouts for college looking at her for potential scholarship. Pt very very much wants to return to this and part of the reason patient asking about "crutches"  Discharge Living Setting Plans for Discharge Living Setting: Patient's home, Lives with (comment)(Spouse and daughter, son is in college at [employer]) Type of Home at Discharge: House Discharge Home Layout: Able to live on main level with bedroom/bathroom Discharge Home Access: Ramped entrance Discharge Bathroom Shower/Tub: Walk-in shower Discharge Bathroom Toilet: Standard Discharge Bathroom Accessibility: Yes How Accessible: Accessible via wheelchair, Accessible via walker(per pt. report ) Does the patient have any problems obtaining your medications?: No  Social/Family/Support Systems Patient Roles: Spouse, Parent, Other (Comment)(Son) Contact Information: Spouse: Engineer, manufacturing  Anticipated Caregiver: Spouse and parents Anticipated Ambulance person Information: see above  Ability/Limitations of Caregiver: Spouse works  Careers adviser: Other (Comment)(most of the time, but not 24/7 ) Discharge  Plan Discussed with Primary Caregiver: Yes Is Caregiver In Agreement with Plan?: Yes Does Caregiver/Family have Issues with Lodging/Transportation while Pt is in Rehab?: No  Goals/Additional Needs  Patient/Family Goal for Rehab: PT/OT: Mod I  Expected length of stay: 5-7 days  Cultural Considerations: None Dietary Needs: Renal diet restrictions and 1200 fluid restriction  Equipment Needs: TBD Pt/Family Agrees to Admission and willing to participate: Yes Program Orientation Provided & Reviewed with Pt/Caregiver Including Roles  & Responsibilities: Yes Additional Information Needs: Pt. with HD: M,W,F PTA  Information Needs to be Provided By: Team FYI  Decrease burden of Care through IP rehab admission: No  Possible need for SNF placement upon discharge: No  Patient Condition: This patient's condition remains as documented in the consult dated 06/09/17, in which the Rehabilitation Physician determined and documented that the patient's condition is appropriate for intensive rehabilitative care in an inpatient rehabilitation facility. Will admit to inpatient rehab today.  Preadmission Screen Completed By:  Gunnar Fusi, 06/10/2017 11:36 AM ______________________________________________________________________   Discussed status with Dr. Naaman Plummer on 06/10/17 at 40 and received telephone approval for admission today.  Admission Coordinator:  Gunnar Fusi, time 1230/Date 06/10/17

## 2017-06-10 NOTE — Progress Notes (Addendum)
[facility] Progress Note  Subjective:  No c/os.  For IP rehab HD yesterday net UF 3.5L   Vitals:   06/09/17 1223 06/09/17 1728 06/10/17 0402 06/10/17 0750  BP: 121/70 121/70 117/71 117/70  Pulse: 100 100 93 93  Resp: 20  20   Temp:   99 F (37.2 C)   TempSrc:   Oral   SpO2: 100% 100% 97%   Weight:      Height:        Inpatient medications: . aspirin EC  325 mg Oral Daily  . calcitRIOL  0.25 mcg Oral Q M,W,F-HD  . calcium acetate  667 mg Oral TID WC  . carvedilol  3.125 mg Oral BID WC  . docusate sodium  100 mg Oral BID  . gabapentin  300 mg Oral TID  . insulin aspart  0-9 Units Subcutaneous TID WC  . insulin aspart  3 Units Subcutaneous TID WC  . insulin glargine  35 Units Subcutaneous QHS  . lisinopril  20 mg Oral QHS  . sucroferric oxyhydroxide  1,500 mg Oral TID WC   . sodium chloride 10 mL/hr at 06/06/17 1629  . methocarbamol (ROBAXIN)  IV     acetaminophen, bisacodyl, HYDROmorphone (DILAUDID) injection, magnesium citrate, methocarbamol **OR** methocarbamol (ROBAXIN)  IV, metoCLOPramide **OR** metoCLOPramide (REGLAN) injection, ondansetron **OR** ondansetron (ZOFRAN) IV, oxyCODONE, oxyCODONE, polyethylene glycol  Exam: Alert, no distress No jvd Chest cta bilat RRR Abd obese soft ntnd Ext right bka, no edema lle Nf, ox 3 Right arm avf  Dialysis: [facility] 4h  500/800  105.5kg  2k/3calc bath  p4  R avf   Hep 10000 -parsabiv 5 mg tiw iv -calcitriol 0.25 ug tiw po      Impression: 1  R foot gangrene sp bka 4/26 dr duda, wbc coming down, not on abx 2  esrd hd mwf 3  Htn/ vol will lower dry wt w/ bka 4  Anemia ckd / abl from surg not on esa 5  mbd ckd cont vdra/ velphoro/ phoslo 6  diab type 1 per primary   Plan - Next HD 5/1 Lower EDW s/p BKA   Ogechi Larina Earthly PA-C [facility] Pager [phone] 06/10/2017,11:31 AM  Pt seen, examined and agree w A/P as above.  [name] MD [facility] pager  [phone]   06/10/2017, 12:33 PM     Recent Labs  Lab 06/06/17 0812 06/07/17 1142 06/09/17 0737  NA 131* 130* 136  K 3.8 3.7 3.2*  CL 90* 90* 97*  CO2 21* 27 23  GLUCOSE 280* 354* 76  BUN 23* 49* 68*  CREATININE 7.08* 9.62* 13.86*  CALCIUM 8.6* 7.8* 8.1*  PHOS  --   --  3.8   Recent Labs  Lab 06/09/17 0737  ALBUMIN 2.0*   Recent Labs  Lab 06/07/17 1142 06/09/17 0737 06/10/17 0307  WBC 22.9* 19.8* 20.8*  HGB 10.9* 10.9* 10.4*  HCT 32.2* 31.7* 31.8*  MCV 98.5 97.8 100.3*  PLT 561* 640* 543*   Iron/TIBC/Ferritin/ %Sat    Component Value Date/Time   IRON 22 (L) 12/04/2015 1230   TIBC 139 (L) 12/04/2015 1230   IRONPCTSAT 16 (L) 12/04/2015 1230

## 2017-06-10 NOTE — Progress Notes (Signed)
0100 RN to room, wound vac alarming. RN checked tubing, restarted vac, working now. Pt denies pain. NAD, fall precautions in place, WCTM.

## 2017-06-10 NOTE — Evaluation (Signed)
Occupational Therapy Evaluation Patient Details Name: Lance [name] MRN: [id number] DOB: 13-Mar-1970 Today's Date: 06/10/2017    History of Present Illness Pt is a 47 y/o male s/p R transtibial amputation. PMH including but not limited to CHF, ESRD and DM.   Clinical Impression   Patient is s/p R BKA surgery resulting in functional limitations due to the deficits listed below (see OT problem list). Pt currently total +2 min (A) from chair height with RW and cues for safe use of RW. Pt very motivated and requires max (A) for LB dressing. Pt has strong support from mother.  Patient will benefit from skilled OT acutely to increase independence and safety with ADLS to allow discharge CIR.  Recommend Peer to peer amputee support group and patient agreeable. (emailed support group pending patients status acutely and CIR admission)     Follow Up Recommendations  CIR    Equipment Recommendations  3 in 1 bedside commode;Wheelchair (measurements OT);Wheelchair cushion (measurements OT)    Recommendations for Other Services Rehab consult     Precautions / Restrictions Precautions Precautions: Fall Precaution Comments: wound VAC Required Braces or Orthoses: Other Brace/Splint Other Brace/Splint: residual limb protector Restrictions Weight Bearing Restrictions: Yes RLE Weight Bearing: Non weight bearing      Mobility Bed Mobility Overal bed mobility: Needs Assistance Bed Mobility: Supine to Sit     Supine to sit: Supervision     General bed mobility comments: pt requires bed rail on Right side and incr time. Pt with posterior LOB x1 but self corrected  Transfers Overall transfer level: Needs assistance Equipment used: Rolling walker (2 wheeled) Transfers: Sit to/from Stand Sit to Stand: Min assist;+2 safety/equipment         General transfer comment: bed elevated and cues for hand placment    Balance           Standing balance support: Bilateral upper extremity  supported;During functional activity Standing balance-Leahy Scale: Poor Standing balance comment: total relience of bil UE on RW in static standing                           ADL either performed or assessed with clinical judgement   ADL Overall ADL's : Needs assistance/impaired Eating/Feeding: Independent   Grooming: Independent Grooming Details (indicate cue type and reason): brushing teeth this session Upper Body Bathing: Set up   Lower Body Bathing: Moderate assistance;Sit to/from stand Lower Body Bathing Details (indicate cue type and reason): pt requires total Dependence on RW in static standing. pt able to wash static sitting peri area anterior and thighs. Pt is unable to complete posterior peri Upper Body Dressing : Set up   Lower Body Dressing: Maximal assistance Lower Body Dressing Details (indicate cue type and reason): pt able to verbalize the reason for residual limb guard as protect the leg from injury. Pt also educated on knee extension  Toilet Transfer: Minimal assistance;+2 for safety/equipment Toilet Transfer Details (indicate cue type and reason): pt requires (A) to power up from elevated bed but able to do so. Min v/c for safety with hand placement   Toileting - Clothing Manipulation Details (indicate cue type and reason): pt expressed interest to go to the bathroom alone for all transfer but educated on the need to have staff due to wound vac / fall risk       General ADL Comments: pt motivated and willing to get OOB at this time.  Vision   Vision Assessment?: No apparent visual deficits     Perception     Praxis      Pertinent Vitals/Pain Pain Assessment: No/denies pain     Hand Dominance Right   Extremity/Trunk Assessment Upper Extremity Assessment Upper Extremity Assessment: RUE deficits/detail;Overall WFL for tasks assessed RUE Deficits / Details: HD port present   Lower Extremity Assessment Lower Extremity Assessment: Defer to  PT evaluation   Cervical / Trunk Assessment Cervical / Trunk Assessment: Normal   Communication Communication Communication: No difficulties   Cognition Arousal/Alertness: Awake/alert Behavior During Therapy: WFL for tasks assessed/performed Overall Cognitive Status: Impaired/Different from baseline Area of Impairment: Safety/judgement                         Safety/Judgement: Decreased awareness of safety     General Comments: pt needs education on fall risk and the reason that he can not transfer without (A) at this time   General Comments  brace on R LE correctly and pt tolerating well. Pt has residual limb protector from "hanger"     Exercises     Shoulder Instructions      Home Living Family/patient expects to be discharged to:: Inpatient rehab                                 Additional Comments: pt travels with daughter to all her [employer] basketball games/ tournments throughout the country. Pt is very active in his daughters sport and very motivated to return to being by her side. Daughter is in the 10 grade with active scouts for college looking at her for potential scholarship. Pt very very much wants to return to this and part of the reason patient asking about "crutches"      Prior Functioning/Environment Level of Independence: Independent                 OT Problem List: Decreased activity tolerance;Decreased cognition;Decreased knowledge of precautions;Decreased knowledge of use of DME or AE;Decreased range of motion;Decreased safety awareness;Decreased strength;Impaired balance (sitting and/or standing);Pain      OT Treatment/Interventions: Self-care/ADL training;Therapeutic activities;Therapeutic exercise;DME and/or AE instruction;Energy conservation;Cognitive remediation/compensation;Patient/family education;Balance training    OT Goals(Current goals can be found in the care plan section) Acute Rehab OT Goals Patient Stated Goal: to  get prosthesis to travel with daughter to all her games OT Goal Formulation: With patient Time For Goal Achievement: 06/24/17 Potential to Achieve Goals: Good  OT Frequency: Min 2X/week   Barriers to D/C:            Co-evaluation              AM-PAC PT "6 Clicks" Daily Activity     Outcome Measure Help from another person eating meals?: None Help from another person taking care of personal grooming?: None Help from another person toileting, which includes using toliet, bedpan, or urinal?: A Little Help from another person bathing (including washing, rinsing, drying)?: A Lot Help from another person to put on and taking off regular upper body clothing?: A Little Help from another person to put on and taking off regular lower body clothing?: A Lot 6 Click Score: 18   End of Session Equipment Utilized During Treatment: Gait belt;Rolling walker Nurse Communication: Mobility status;Precautions  Activity Tolerance: Patient tolerated treatment well Patient left: in chair;with call bell/phone within reach  OT Visit Diagnosis: Unsteadiness on feet (R26.81)  Time: 1423-9532 OT Time Calculation (min): 23 min Charges:  OT General Charges $OT Visit: 1 Visit OT Evaluation $OT Eval Moderate Complexity: 1 Mod G-Codes:      Jeri Modena   OTR/L Pager: [phone] Office: [phone] .   Parke Poisson B 06/10/2017, 9:40 AM

## 2017-06-10 NOTE — IPOC Note (Signed)
Overall Plan of Care [facility]) Patient Details Name: Lance [name] MRN: [id number] DOB: 09-Dec-1970  Admitting Diagnosis: Right BKA  Hospital Problems: Active Problems:   Amputation of right lower extremity below knee ([place])   Poorly controlled type 2 diabetes mellitus with peripheral neuropathy ([place])   Essential hypertension   Unilateral complete BKA, right, sequela ([place])     Functional Problem List: Nursing Edema, Endurance, Medication Management, Motor, Pain, Sensory, Skin Integrity  PT Balance, Endurance, Pain, Safety, Sensory, Skin Integrity  OT Balance, Safety, Endurance, Sensory, Motor  SLP    TR         Basic ADL's: OT Bathing, Dressing, Toileting     Advanced  ADL's: OT Simple Meal Preparation, Light Housekeeping     Transfers: PT Bed Mobility, Bed to Chair, Car, [employer], Futures trader, Metallurgist: PT Ambulation, Wheelchair Mobility     Additional Impairments: OT None  SLP        TR      Anticipated Outcomes Item Anticipated Outcome  Self Feeding mod I  Swallowing      Basic self-care  mod I  Toileting  mod I   Bathroom Transfers mod I  Bowel/Bladder  Pt will manage bowel and bladder with min assist at discharge.   Transfers  Mod I with LRAD   Locomotion  Mod I for household distances with LRAD   Communication     Cognition     Pain  Pt will manage pain at 3 or less on a scale of 0-10.   Safety/Judgment  Pt will remain free of falls with injury while in rehab with min assist    Therapy Plan: PT Intensity: Minimum of 1-2 x/day ,45 to 90 minutes PT Frequency: 5 out of 7 days PT Duration Estimated Length of Stay: 7-10days  OT Intensity: Minimum of 1-2 x/day, 45 to 90 minutes OT Duration/Estimated Length of Stay: 7-10 days      Team Interventions: Nursing Interventions Patient/Family Education, Pain Management, Disease Management/Prevention, Medication Management, Discharge Planning, Skin Care/Wound Management   PT interventions Ambulation/gait training, Training and development officer, Community reintegration, Discharge planning, Disease management/prevention, DME/adaptive equipment instruction, Functional electrical stimulation, Functional mobility training, Pain management, Neuromuscular re-education, Patient/family education, Psychosocial support, Skin care/wound management, Splinting/orthotics, Stair training, Therapeutic Activities, Therapeutic Exercise, UE/LE Coordination activities, UE/LE Strength taining/ROM, Visual/perceptual remediation/compensation, Wheelchair propulsion/positioning  OT Interventions Training and development officer, Community reintegration, Disease mangement/prevention, Barrister's clerk education, Self Care/advanced ADL retraining, Therapeutic Exercise, Wheelchair propulsion/positioning, UE/LE Coordination activities, Discharge planning, DME/adaptive equipment instruction, Functional mobility training, Pain management, Psychosocial support, Therapeutic Activities, UE/LE Strength taining/ROM  SLP Interventions    TR Interventions    SW/CM Interventions Discharge Planning, Psychosocial Support, Patient/Family Education   Barriers to Discharge MD  Medical stability, Lack of/limited family support, Hemodialysis and Weight bearing restrictions  Nursing      PT Decreased caregiver support, Medical stability, Home environment access/layout, Wound Care    OT      SLP      SW       Team Discharge Planning: Destination: PT-Home ,OT- Home , SLP-  Projected Follow-up: PT-Home health PT, OT-  None, SLP-  Projected Equipment Needs: PT-Wheelchair cushion (measurements), Wheelchair (measurements), Rolling walker with 5" wheels, OT- To be determined, SLP-  Equipment Details: PT- , OT-  Patient/family involved in discharge planning: PT- Patient,  OT-Patient, SLP-   MD ELOS: 6-9 days. Medical Rehab Prognosis:  Good  Assessment: 47 year old right-handed male with history of end-stage renal  disease with hemodialysis, diastolic congestive heart failure, PSVT, diabetes mellitus, tobacco abuse as well as peripheral vascular disease. Presented 06/06/2017 with gangrenous changes of the right foot. Recent vascular studies by Dr. Trula Slade showed no revascularization options. 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 Sharol Given. Wound VAC applied. Hospital course pain management. Hemodialysis ongoing as per renal services. Acute on chronic anemia monitored. Leukocytosis. Patient with resulting functional deficits with mobility, transfers, self-care.  Will set goals for Mod I with PT/OT.  See Team Conference Notes for weekly updates to the plan of care

## 2017-06-10 NOTE — Progress Notes (Signed)
1915 Bedside shift report. Pt resting in recliner. NAD, no complaints. Wife at bedside. Fall precautions in place, [facility].   2005 Pt assessed, see flow sheet. No complaints.   2145 Pt back in bed, medicated per MAR. Pt c/o pain in right lower extremity. Wound vac alarming. Shrinker and stump protector removed. Reinforced dressing to wound vac. Shrinker and stump protector placed back on right BKA. Pt tolerated poorly, severe pain noted. No drainage in wound vac container. Fall precautions in place, [facility].

## 2017-06-10 NOTE — H&P (Signed)
Physical Medicine and Rehabilitation Admission H&P    Chief complaint: Stump pain  HPI: [name]. Lance [name] is a 47 year old right-handed male with history of end-stage renal disease with hemodialysis, diastolic congestive heart failure, PSVT, diabetes mellitus, tobacco abuse as well as peripheral vascular disease.  He lives with his wife and 37 year old daughter.  They have a 18 year old son who is in college.  One level home with planned ramped entrance.  Wife works during the day.  He does have a mother close by who can assist.  Presented 06/06/2017 with gangrenous changes of the right foot.  Recent vascular studies by Dr. Trula Slade showed no revascularization options.  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 Sharol Given.  Wound VAC applied.  Hospital course pain management.  Hemodialysis ongoing as per renal services.  Acute on chronic anemia 10.4 and monitored.  Leukocytosis 20,800.  Physical and occupational therapy evaluations completed with recommendations of physical medicine rehab consult.  Patient was admitted for a comprehensive rehab program  Review of Systems  Constitutional: Negative for chills, diaphoresis and fever.  HENT: Negative for hearing loss and tinnitus.   Eyes: Negative for blurred vision and double vision.  Cardiovascular: Positive for leg swelling. Negative for chest pain and palpitations.  Gastrointestinal: Positive for constipation. Negative for nausea and vomiting.  Genitourinary: Negative for hematuria.  Musculoskeletal: Positive for joint pain and myalgias.  Neurological: Negative for seizures.  All other systems reviewed and are negative.  Past Medical History:  Diagnosis Date  . Aortic stenosis    Very mild in 05/2007  . Arthritis    "right shoulder; right knee; feel like I've got it all over" (11/28/2015)  . CHF (congestive heart failure) ([place])   . Degenerative joint disease    Left TKA  . Diastolic heart failure (HCC)    LVEF 65-70%  with grade 2 diastolic dysfunction  . ESRD (end stage renal disease) on dialysis ([place])    "MWF; [facility] on [address]" (11/28/2015)  . Essential hypertension, benign 2000   LVH  . GERD (gastroesophageal reflux disease)   . HCAP (healthcare-associated pneumonia) 11/28/2015  . Hyperlipidemia 2000  . Loculated pleural effusion 11/28/2015   [name] 11/28/2015  . Pneumonia 12/2013; 05/2014  . PSVT (paroxysmal supraventricular tachycardia) (HCC)    Nonsustained; asymptomatic; diagnosed by event recorder in 2006  . Tobacco abuse, in remission    20 pack years; discontinued in 1985; bronchitic changes on chest x-ray and 2009  . Type 1 diabetes mellitus ([place]) 1990   Past Surgical History:  Procedure Laterality Date  . AMPUTATION Right 06/06/2017   Procedure: RIGHT BELOW KNEE AMPUTATION;  Surgeon: [name], MD;  Location: [place];  Service: Orthopedics;  Laterality: Right;  . AV FISTULA PLACEMENT Right 06/23/2014   Procedure: ARTERIOVENOUS (AV) FISTULA CREATION;  Surgeon: Angelia Mould, MD;  Location: [place];  Service: Vascular;  Laterality: Right;  . CARDIAC CATHETERIZATION     [facility]- evaluation for Kidney/ pancreas transplant  . EYE SURGERY Bilateral    "for glaucoma"  . INGUINAL HERNIA REPAIR Right    As a child  . LOWER EXTREMITY ANGIOGRAPHY N/A 05/27/2017   Procedure: LOWER EXTREMITY ANGIOGRAPHY;  Surgeon: [name], MD;  Location: [place] [facility];  Service: Cardiovascular;  Laterality: N/A;  . WISDOM TOOTH EXTRACTION  1987   Family History  Problem Relation Age of Onset  . Diabetes Father   . Hypertension Father   . Diabetes Mother   . Hypertension Mother  Social History:  reports that he has never smoked. He has never used smokeless tobacco. He reports that he drinks alcohol. He reports that he does not use drugs. Allergies:  Allergies  Allergen Reactions  . Atorvastatin Other (See Comments)    Myalgias   . Minoxidil Other (See Comments)    Fluid  retention    Medications Prior to Admission  Medication Sig Dispense Refill  . calcium acetate (PHOSLO) 667 MG capsule Take 1 capsule by mouth 3 (three) times daily.  10  . carvedilol (COREG CR) 10 MG 24 hr capsule Take 5 mg by mouth 2 (two) times daily.     . Continuous Blood Gluc Sensor (FREESTYLE LIBRE SENSOR SYSTEM) MISC Use one sensor every 10 days. 3 each 2  . HUMALOG KWIKPEN 100 UNIT/ML KiwkPen Inject 12-18 Units into the skin 3 (three) times daily before meals.  0  . HYDROcodone-acetaminophen (NORCO/VICODIN) 5-325 MG tablet Take 1 tablet by mouth every 6 (six) hours as needed for moderate pain. 20 tablet 0  . LANTUS 100 UNIT/ML injection Inject 30 Units into the skin at bedtime.  10  . lisinopril (PRINIVIL,ZESTRIL) 20 MG tablet Take 20 mg by mouth at bedtime.     . ranitidine (ZANTAC) 75 MG tablet Take 75 mg by mouth daily as needed for heartburn.    . sucroferric oxyhydroxide (VELPHORO) 500 MG chewable tablet Chew 3 tablets by mouth 3 (three) times daily.      Drug Regimen Review Drug regimen was reviewed and remains appropriate with no significant issues identified  Home: Home Living Family/patient expects to be discharged to:: Private residence Living Arrangements: Alone Available Help at Discharge: Family, Friend(s), Available PRN/intermittently, Other (Comment)(will only be alone for ~2 hrs) Type of Home: House Home Access: Stairs to enter, Other (comment)(working on getting a ramp) Entrance Stairs-Number of Steps: 5 Entrance Stairs-Rails: Right, Left Home Layout: Able to live on main level with bedroom/bathroom Bathroom Shower/Tub: Walk-in shower Home Equipment: Crutches   Functional History: Prior Function Level of Independence: Independent  Functional Status:  Mobility: Bed Mobility Overal bed mobility: Needs Assistance Bed Mobility: Supine to Sit Supine to sit: Supervision Sit to supine: Supervision General bed mobility comments: supine to sit with  increased time Transfers Overall transfer level: Needs assistance Equipment used: Rolling walker (2 wheeled) Transfers: Sit to/from Stand Sit to Stand: Mod assist, +2 safety/equipment General transfer comment: min verbal cueing for hand placement and scooting forward to edge. Moderate assistance to boost up and steady.  Ambulation/Gait Ambulation/Gait assistance: Min guard Ambulation Distance (Feet): 30 Feet Assistive device: Rolling walker (2 wheeled) General Gait Details: Hopped 30 feet (15x15) with RW; chair follow utilized.    ADL:    Cognition: Cognition Overall Cognitive Status: Within Functional Limits for tasks assessed Orientation Level: Oriented X4 Cognition Arousal/Alertness: Awake/alert Behavior During Therapy: WFL for tasks assessed/performed Overall Cognitive Status: Within Functional Limits for tasks assessed  Physical Exam: Blood pressure 117/71, pulse 93, temperature 99 F (37.2 C), temperature source Oral, resp. rate 20, height _0  (1.905 m), weight 106.2 kg (234 lb 2.1 oz), SpO2 97 %. Physical Exam  Vitals reviewed. Constitutional: He is oriented to person, place, and time. He appears well-developed.  obese  HENT:  Head: Normocephalic.  Eyes: EOM are normal. Right eye exhibits no discharge. Left eye exhibits no discharge.  Neck: Normal range of motion. Neck supple. No thyromegaly present.  Cardiovascular: Normal rate, regular rhythm and normal heart sounds.  Respiratory: Effort normal and breath sounds normal.  No respiratory distress.  GI: Soft. Bowel sounds are normal. He exhibits no distension.  Musculoskeletal: He exhibits edema.  Right leg tender to palpation and ROM  Neurological: He is alert and oriented to person, place, and time. He has normal reflexes. No cranial nerve deficit.  UE 5/5 prox to distal. RLE limited due to pain. LLE 4/5.   Skin:  Right BK in limb guard. Vac attached to wound, minimal drainage through cannister.   Psychiatric:  He has a normal mood and affect. His behavior is normal.      Results for orders placed or performed during the hospital encounter of 06/06/17 (from the past 48 hour(s))  Glucose, capillary     Status: Abnormal   Collection Time: 06/08/17 11:17 AM  Result Value Ref Range   Glucose-Capillary 219 (H) 65 - 99 mg/dL  Glucose, capillary     Status: Abnormal   Collection Time: 06/08/17  4:43 PM  Result Value Ref Range   Glucose-Capillary 130 (H) 65 - 99 mg/dL  Glucose, capillary     Status: Abnormal   Collection Time: 06/08/17  8:33 PM  Result Value Ref Range   Glucose-Capillary 115 (H) 65 - 99 mg/dL  Renal function panel     Status: Abnormal   Collection Time: 06/09/17  7:37 AM  Result Value Ref Range   Sodium 136 135 - 145 mmol/L   Potassium 3.2 (L) 3.5 - 5.1 mmol/L   Chloride 97 (L) 101 - 111 mmol/L   CO2 23 22 - 32 mmol/L   Glucose, Bld 76 65 - 99 mg/dL   BUN 68 (H) 6 - 20 mg/dL   Creatinine, Ser 13.86 (H) 0.61 - 1.24 mg/dL   Calcium 8.1 (L) 8.9 - 10.3 mg/dL   Phosphorus 3.8 2.5 - 4.6 mg/dL   Albumin 2.0 (L) 3.5 - 5.0 g/dL   GFR calc non Af Amer 4 (L) >60 mL/min   GFR calc Af Amer 4 (L) >60 mL/min    Comment: (NOTE) The eGFR has been calculated using the CKD EPI equation. This calculation has not been validated in all clinical situations. eGFR's persistently <60 mL/min signify possible Chronic Kidney Disease.    Anion gap 16 (H) 5 - 15    Comment: Performed at [facility], [place] [address], [place], [place] [address]  CBC     Status: Abnormal   Collection Time: 06/09/17  7:37 AM  Result Value Ref Range   WBC 19.8 (H) 4.0 - 10.5 K/uL   RBC 3.24 (L) 4.22 - 5.81 MIL/uL   Hemoglobin 10.9 (L) 13.0 - 17.0 g/dL   HCT 31.7 (L) 39.0 - 52.0 %   MCV 97.8 78.0 - 100.0 fL   MCH 33.6 26.0 - 34.0 pg   MCHC 34.4 30.0 - 36.0 g/dL   RDW 15.0 11.5 - 15.5 %   Platelets 640 (H) 150 - 400 K/uL    Comment: Performed at [facility], [place] [address] [place], [place] [address]   Glucose, capillary     Status: Abnormal   Collection Time: 06/09/17 12:14 PM  Result Value Ref Range   Glucose-Capillary 108 (H) 65 - 99 mg/dL  Glucose, capillary     Status: Abnormal   Collection Time: 06/09/17  4:26 PM  Result Value Ref Range   Glucose-Capillary 298 (H) 65 - 99 mg/dL  Glucose, capillary     Status: Abnormal   Collection Time: 06/09/17  9:39 PM  Result Value Ref Range  Glucose-Capillary 236 (H) 65 - 99 mg/dL  CBC     Status: Abnormal   Collection Time: 06/10/17  3:07 AM  Result Value Ref Range   WBC 20.8 (H) 4.0 - 10.5 K/uL   RBC 3.17 (L) 4.22 - 5.81 MIL/uL   Hemoglobin 10.4 (L) 13.0 - 17.0 g/dL   HCT 31.8 (L) 39.0 - 52.0 %   MCV 100.3 (H) 78.0 - 100.0 fL   MCH 32.8 26.0 - 34.0 pg   MCHC 32.7 30.0 - 36.0 g/dL   RDW 15.0 11.5 - 15.5 %   Platelets 543 (H) 150 - 400 K/uL    Comment: Performed at [facility], [place] [address] [place], [place] [address]  Glucose, capillary     Status: Abnormal   Collection Time: 06/10/17  4:03 AM  Result Value Ref Range   Glucose-Capillary 223 (H) 65 - 99 mg/dL  Glucose, capillary     Status: Abnormal   Collection Time: 06/10/17  6:46 AM  Result Value Ref Range   Glucose-Capillary 226 (H) 65 - 99 mg/dL   No results found.     Medical Problem List and Plan: 1.  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  Wound VAC x1 week  -admit to inpatient rehab 2.  DVT Prophylaxis/Anticoagulation: SCDs left lower extremity 3. Pain Management: Neurontin 300 mg 3 times daily, Robaxin and oxycodone as needed 4. Mood: Provide emotional support 5. Neuropsych: This patient is capable of making decisions on his own behalf. 6. Skin/Wound Care: Routine skin checks 7. Fluids/Electrolytes/Nutrition: Routine I and Os with follow-up chemistries 8.  End-stage renal disease.  Continue hemodialysis as directed  -HD MWF after therapy day to allow for participation in therapies 9.  Acute on chronic  anemia.  Follow-up CBC 10.  Hypertension.  Coreg 3.125 mg twice daily, lisinopril 20 mg daily 11.  Diastolic congestive heart failure.  Monitor for any signs of fluid overload 12,.  Diabetes mellitus with peripheral neuropathy.  Hemoglobin A1c 9.1.  Lantus insulin 35 units nightly, NovoLog 3 units 3 times daily.  Check blood sugars before meals and at bedtime.  Diabetic teaching 13.  Tobacco abuse.  Counseling 14.  Constipation.  Laxative assistance  Post Admission Physician Evaluation: 1. Functional deficits secondary  to right BKA. 2. Patient is admitted to receive collaborative, interdisciplinary care between the physiatrist, rehab nursing staff, and therapy team. 3. Patient's level of medical complexity and substantial therapy needs in context of that medical necessity cannot be provided at a lesser intensity of care such as a SNF. 4. Patient has experienced substantial functional loss from his/her baseline which was documented above under the "Functional History" and "Functional Status" headings.  Judging by the patient's diagnosis, physical exam, and functional history, the patient has potential for functional progress which will result in measurable gains while on inpatient rehab.  These gains will be of substantial and practical use upon discharge  in facilitating mobility and self-care at the household level. 5. Physiatrist will provide 24 hour management of medical needs as well as oversight of the therapy plan/treatment and provide guidance as appropriate regarding the interaction of the two. 6. The Preadmission Screening has been reviewed and patient status is unchanged unless otherwise stated above. 7. 24 hour rehab nursing will assist with bladder management, bowel management, safety, skin/wound care, disease management, medication administration, pain management and patient education  and help integrate therapy concepts, techniques,education, etc. 8. PT will assess and treat for/with:  Lower extremity  strength, range of motion, stamina, balance, functional mobility, safety, adaptive techniques and equipment, pre-prosthetic education.   Goals are: mod I. 9. OT will assess and treat for/with: ADL's, functional mobility, safety, upper extremity strength, adaptive techniques and equipment, pain mgt, ego support, community reentry.   Goals are: mod I. Therapy may not yet proceed with showering this patient. 10. SLP will assess and treat for/with: n/a.  Goals are: n/a. 11. Case Management and Social Worker will assess and treat for psychological issues and discharge planning. 12. Team conference will be held weekly to assess progress toward goals and to determine barriers to discharge. 13. Patient will receive at least 3 hours of therapy per day at least 5 days per week. 14. ELOS: 5-7 days       15. Prognosis:  excellent    I have personally performed a face to face diagnostic evaluation of this patient and formulated the key components of the plan.  Additionally, I have personally reviewed laboratory data, imaging studies, as well as relevant notes and concur with the physician assistant's documentation above.  Meredith Staggers, MD, FAAPMR   Lavon Paganini [place], PA-C 06/10/2017

## 2017-06-11 ENCOUNTER — Ambulatory Visit (INDEPENDENT_AMBULATORY_CARE_PROVIDER_SITE_OTHER): Payer: BLUE CROSS/BLUE SHIELD

## 2017-06-11 ENCOUNTER — Inpatient Hospital Stay (HOSPITAL_COMMUNITY): Payer: BLUE CROSS/BLUE SHIELD

## 2017-06-11 ENCOUNTER — Inpatient Hospital Stay (HOSPITAL_COMMUNITY): Payer: Medicare Other | Admitting: Physical Therapy

## 2017-06-11 ENCOUNTER — Inpatient Hospital Stay (HOSPITAL_COMMUNITY): Payer: Medicare Other

## 2017-06-11 DIAGNOSIS — E1142 Type 2 diabetes mellitus with diabetic polyneuropathy: Secondary | ICD-10-CM

## 2017-06-11 DIAGNOSIS — I5032 Chronic diastolic (congestive) heart failure: Secondary | ICD-10-CM

## 2017-06-11 DIAGNOSIS — D72829 Elevated white blood cell count, unspecified: Secondary | ICD-10-CM

## 2017-06-11 DIAGNOSIS — S88111S Complete traumatic amputation at level between knee and ankle, right lower leg, sequela: Secondary | ICD-10-CM

## 2017-06-11 DIAGNOSIS — Z992 Dependence on renal dialysis: Secondary | ICD-10-CM

## 2017-06-11 DIAGNOSIS — E1165 Type 2 diabetes mellitus with hyperglycemia: Secondary | ICD-10-CM

## 2017-06-11 DIAGNOSIS — D638 Anemia in other chronic diseases classified elsewhere: Secondary | ICD-10-CM

## 2017-06-11 DIAGNOSIS — D62 Acute posthemorrhagic anemia: Secondary | ICD-10-CM

## 2017-06-11 DIAGNOSIS — N186 End stage renal disease: Secondary | ICD-10-CM

## 2017-06-11 DIAGNOSIS — I10 Essential (primary) hypertension: Secondary | ICD-10-CM

## 2017-06-11 LAB — CBC
HCT: 30.6 % — ABNORMAL LOW (ref 39.0–52.0)
Hemoglobin: 10 g/dL — ABNORMAL LOW (ref 13.0–17.0)
MCH: 33.2 pg (ref 26.0–34.0)
MCHC: 32.7 g/dL (ref 30.0–36.0)
MCV: 101.7 fL — ABNORMAL HIGH (ref 78.0–100.0)
PLATELETS: 592 10*3/uL — AB (ref 150–400)
RBC: 3.01 MIL/uL — ABNORMAL LOW (ref 4.22–5.81)
RDW: 15.7 % — AB (ref 11.5–15.5)
WBC: 19.8 10*3/uL — AB (ref 4.0–10.5)

## 2017-06-11 LAB — RENAL FUNCTION PANEL
ALBUMIN: 2.1 g/dL — AB (ref 3.5–5.0)
Anion gap: 15 (ref 5–15)
BUN: 61 mg/dL — AB (ref 6–20)
CO2: 28 mmol/L (ref 22–32)
CREATININE: 13.1 mg/dL — AB (ref 0.61–1.24)
Calcium: 8.3 mg/dL — ABNORMAL LOW (ref 8.9–10.3)
Chloride: 90 mmol/L — ABNORMAL LOW (ref 101–111)
GFR calc Af Amer: 5 mL/min — ABNORMAL LOW (ref >60)
GFR, EST NON AFRICAN AMERICAN: 4 mL/min — AB (ref >60)
GLUCOSE: 115 mg/dL — AB (ref 65–99)
PHOSPHORUS: 4.2 mg/dL (ref 2.5–4.6)
Potassium: 4.4 mmol/L (ref 3.5–5.1)
Sodium: 133 mmol/L — ABNORMAL LOW (ref 135–145)

## 2017-06-11 LAB — GLUCOSE, CAPILLARY
GLUCOSE-CAPILLARY: 196 mg/dL — AB (ref 65–99)
GLUCOSE-CAPILLARY: 131 mg/dL — AB (ref 65–99)
GLUCOSE-CAPILLARY: 91 mg/dL (ref 65–99)
Glucose-Capillary: 189 mg/dL — ABNORMAL HIGH (ref 65–99)

## 2017-06-11 MED ORDER — PENTAFLUOROPROP-TETRAFLUOROETH EX AERO: 1.0000 "application " | INHALATION_SPRAY | CUTANEOUS | Status: DC | PRN

## 2017-06-11 MED ORDER — HYDROCERIN EX CREA
TOPICAL_CREAM | Freq: Two times a day (BID) | CUTANEOUS | Status: DC
  Administered 2017-06-11 – 2017-06-17 (×13): via TOPICAL
  Filled 2017-06-11: qty 113

## 2017-06-11 MED ORDER — HEPARIN SODIUM (PORCINE) 1000 UNIT/ML DIALYSIS
1000.0000 [IU] | INTRAMUSCULAR | Status: DC | PRN
  Filled 2017-06-11: qty 1

## 2017-06-11 MED ORDER — SODIUM CHLORIDE 0.9 % IV SOLN: 100.0000 mL | INTRAVENOUS | Status: DC | PRN

## 2017-06-11 MED ORDER — PANTOPRAZOLE SODIUM 40 MG PO TBEC
40.0000 mg | DELAYED_RELEASE_TABLET | Freq: Every day | ORAL | Status: DC
  Administered 2017-06-11 – 2017-06-18 (×8): 40 mg via ORAL
  Filled 2017-06-11 (×8): qty 1

## 2017-06-11 MED ORDER — LIDOCAINE HCL (PF) 1 % IJ SOLN: 5.0000 mL | INTRAMUSCULAR | Status: DC | PRN

## 2017-06-11 MED ORDER — LIDOCAINE-PRILOCAINE 2.5-2.5 % EX CREA
1.0000 "application " | TOPICAL_CREAM | CUTANEOUS | Status: DC | PRN
  Filled 2017-06-11: qty 5

## 2017-06-11 MED ORDER — HEPARIN SODIUM (PORCINE) 1000 UNIT/ML DIALYSIS
20.0000 [IU]/kg | INTRAMUSCULAR | Status: DC | PRN
  Filled 2017-06-11: qty 3

## 2017-06-11 MED ORDER — HYDROCERIN EX CREA
TOPICAL_CREAM | CUTANEOUS | Status: DC | PRN
  Filled 2017-06-11: qty 113

## 2017-06-11 NOTE — Progress Notes (Signed)
Patient information reviewed and entered into eRehab system by Ceriah Kohler, RN, CRRN, PPS Coordinator.  Information including medical coding and functional independence measure will be reviewed and updated through discharge.    

## 2017-06-11 NOTE — Evaluation (Addendum)
Occupational Therapy Assessment and Plan  Patient Details  Name: Lance [name] MRN: [id number] Date of Birth: [birth year]  OT Diagnosis: R BKA Rehab Potential: Rehab Potential (ACUTE ONLY): Excellent ELOS: 7-10 days   Today's Date: 06/11/2017 OT Individual Time: 1324-4010 OT Individual Time Calculation (min): 74 min     Problem List:  Patient Active Problem List   Diagnosis Date Noted  . Poorly controlled type 2 diabetes mellitus with peripheral neuropathy ([place])   . Essential hypertension   . Unilateral complete BKA, right, sequela ([place])   . Amputation of right lower extremity below knee ([place]) 06/10/2017  . Unilateral complete BKA, right, initial encounter ([place])   . ESRD on dialysis ([place])   . Chronic diastolic congestive heart failure ([place])   . History of PSVT (paroxysmal supraventricular tachycardia)   . Diabetes mellitus type 2 in nonobese (HCC)   . Post-operative pain   . Acute blood loss anemia   . Anemia of chronic disease   . Leukocytosis   . Tachycardia   . History of right below knee amputation ([place]) 06/06/2017  . Gangrene of right foot ([place])   . PVD (peripheral vascular disease) ([place]) 05/13/2017  . Bilateral pleural effusion 11/28/2015  . Loculated pleural effusion 11/28/2015  . HCAP (healthcare-associated pneumonia) 11/28/2015  . ESRD (end stage renal disease) on dialysis ([place]) 11/12/2015  . Hyperkalemia 11/12/2015  . Shoulder pain, acute 11/12/2015  . Nausea & vomiting 11/12/2015  . Diarrhea 11/12/2015  . Hypertensive urgency 05/05/2014  . Nephrotic syndrome 12/29/2013  . Hyponatremia 12/28/2013  . Acute diastolic heart failure ([place]) 12/28/2013  . Hypoglycemia 12/28/2013  . Dyspnea   . Acute renal failure syndrome ([place])   . Diabetic ketoacidosis without coma associated with type 1 diabetes mellitus ([place])   . Demand ischemia ([place]) 12/24/2013  . DKA (diabetic ketoacidoses) ([place]) 12/23/2013  . CAP (community acquired pneumonia) 12/23/2013  . Sepsis due  to pneumonia ([place]) 12/23/2013  . Metabolic acidemia 27/25/3664  . Acute renal failure ([place]) 12/23/2013  . Diabetic ketoacidosis ([place]) 12/23/2013  . Chronic diastolic heart failure ([place]) 04/14/2013  . PSVT (paroxysmal supraventricular tachycardia) ([place]) 04/14/2013  . Bilateral leg edema 03/26/2013  . Chronic kidney disease, stage III (moderate) ([place]) 09/29/2011  . 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 Essential hypertension, benign 09/26/2010  . Hyperlipidemia 09/26/2010    Past Medical History:  Past Medical History:  Diagnosis Date  . Aortic stenosis    Very mild in 05/2007  . Arthritis    "right shoulder; right knee; feel like I've got it all over" (11/28/2015)  . CHF (congestive heart failure) ([place])   . Degenerative joint disease    Left TKA  . Diastolic heart failure (HCC)    LVEF 65-70% with grade 2 diastolic dysfunction  . ESRD (end stage renal disease) on dialysis ([place])    "MWF; [facility] on [address]" (11/28/2015)  . Essential hypertension, benign 2000   LVH  . GERD (gastroesophageal reflux disease)   . HCAP (healthcare-associated pneumonia) 11/28/2015  . Hyperlipidemia 2000  . Loculated pleural effusion 11/28/2015   [name] 11/28/2015  . Pneumonia 12/2013; 05/2014  . PSVT (paroxysmal supraventricular tachycardia) (HCC)    Nonsustained; asymptomatic; diagnosed by event recorder in 2006  . Tobacco abuse, in remission    20 pack years; discontinued in 1985; bronchitic changes on chest x-ray and 2009  . Type 1 diabetes mellitus ([place]) 1990   Past Surgical History:  Past Surgical History:  Procedure Laterality  Date  . AMPUTATION Right 06/06/2017   Procedure: RIGHT BELOW KNEE AMPUTATION;  Surgeon: [name], MD;  Location: [facility];  Service: Orthopedics;  Laterality: Right;  . AV FISTULA PLACEMENT Right 06/23/2014   Procedure: ARTERIOVENOUS (AV) FISTULA CREATION;  Surgeon: Angelia Mould, MD;  Location: [facility];  Service: Vascular;   Laterality: Right;  . CARDIAC CATHETERIZATION     [facility]- evaluation for Kidney/ pancreas transplant  . EYE SURGERY Bilateral    "for glaucoma"  . INGUINAL HERNIA REPAIR Right    As a child  . LOWER EXTREMITY ANGIOGRAPHY N/A 05/27/2017   Procedure: LOWER EXTREMITY ANGIOGRAPHY;  Surgeon: [name], MD;  Location: [facility];  Service: Cardiovascular;  Laterality: N/A;  . WISDOM TOOTH EXTRACTION  1987    Assessment & Plan Clinical Impression: [name]. Careaga III is a 47 year old right-handed male with history of end-stage renal disease with hemodialysis, diastolic congestive heart failure, PSVT, diabetes mellitus, tobacco abuse as well as peripheral vascular disease. He lives with his wife and 8 year old daughter. They have a 13 year old son who is in college. One level home with planned ramped entrance. Wife works during the day. He does have a mother close by who can assist. Presented 06/06/2017 with gangrenous changes of the right foot. Recent vascular studies by Dr. Trula Slade showed no revascularization options. 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 Sharol Given. Wound VAC applied. Hospital course pain management. Hemodialysis ongoing as per renal services. Acute on chronic anemia 10.4 and monitored. Leukocytosis 20,800. Physical and occupational therapy evaluations completed with recommendations of physical medicine rehab consult. Patient was admitted for a comprehensive rehab program   Patient transferred to CIR on 06/10/2017 .    Patient currently requires min with basic self-care skills secondary to R BKA and decreased standing balance and decreased balance strategies.  Prior to hospitalization, patient could complete ADLs with independent .  Patient will benefit from skilled intervention to increase independence with basic self-care skills and increase level of independence with iADL prior to discharge home with care partner.  Anticipate patient  will require intermittent supervision and no further OT follow recommended.  OT - End of Session Activity Tolerance: Tolerates 30+ min activity with multiple rests Endurance Deficit: Yes OT Assessment Rehab Potential (ACUTE ONLY): Excellent OT Patient demonstrates impairments in the following area(s): Balance;Safety;Endurance;Sensory;Motor OT Basic ADL's Functional Problem(s): Bathing;Dressing;Toileting OT Advanced ADL's Functional Problem(s): Simple Meal Preparation;Light Housekeeping OT Transfers Functional Problem(s): Toilet;Tub/Shower OT Additional Impairment(s): None OT Plan OT Intensity: Minimum of 1-2 x/day, 45 to 90 minutes OT frequency: 5 out of 7 days OT Duration/Estimated Length of Stay: 7-10 days OT Treatment/Interventions: Balance/vestibular training;Community reintegration;Disease mangement/prevention;Patient/family education;Self Care/advanced ADL retraining;Therapeutic Exercise;Wheelchair propulsion/positioning;UE/LE Coordination activities;Discharge planning;DME/adaptive equipment instruction;Functional mobility training;Pain management;Psychosocial support;Therapeutic Activities;UE/LE Strength taining/ROM OT Self Feeding Anticipated Outcome(s): mod I OT Basic Self-Care Anticipated Outcome(s): mod I OT Toileting Anticipated Outcome(s): mod I OT Bathroom Transfers Anticipated Outcome(s): mod I OT Recommendation Patient destination: Home Follow Up Recommendations: None Equipment Recommended: To be determined   Skilled Therapeutic Intervention Pt received siting up in w/c agreeable to therapy with no c/o pain. During initial conversation with pt, pt gestures to feeling nauseus, and vomited several times. RN summoned to assess n/v. Discussion with pt re R BKA rehab progression, OT POC, establishing pt centered goals, and d/c planning. Pt propelled w/c to sink level and performed UB bathing/dressing with set up, but refused to perform LB this session. Demo/edu provided re UE  placement on RW/w/c  during ADL transfers. Pt completed sit to stand transfer with min A for initiation of transfers but mod A for stabilizing balance once upright. Pt demo poor safety awareness/condition insight during session, stating that he could perform transfers/mobility independently. Edu provided re importance of (S) with all mobility in rehab and fall risk reduction. Pt ambulated into bathroom with min A and vc for Rw management. Pt completed toilet and tub transfer with min A and heavy vc for sequencing transfer. Pt simulated LB dressing with theraband, with min A estimated to be required for distal LB dressing. Pt propelled w/c 100 ft x2 intervals with demo and vc for w/c management and UE placement provided. Pt left in room with chair alarm set and all needs met.   OT Evaluation Precautions/Restrictions  Precautions Precautions: Fall Precaution Comments: wound VAC Required Braces or Orthoses: Other Brace/Splint Other Brace/Splint: residual limb protector Restrictions Weight Bearing Restrictions: Yes RLE Weight Bearing: Non weight bearing Other Position/Activity Restrictions: R LE General Chart Reviewed: Yes Family/Caregiver Present: No Vital Signs Therapy Vitals Temp: 98.7 F (37.1 C) Temp Source: Oral Pulse Rate: 92 BP: 125/69 Patient Position (if appropriate): Lying Oxygen Therapy SpO2: 93 % Pain Pain Assessment Pain Scale: 0-10 Pain Score: 0-No pain Home Living/Prior Functioning Home Living Family/patient expects to be discharged to:: Private residence Living Arrangements: Alone Available Help at Discharge: Family, Friend(s), Available PRN/intermittently Type of Home: House Home Access: Ramped entrance Entrance Stairs-Number of Steps: 5 Entrance Stairs-Rails: Right, Left Home Layout: Two level, Able to live on main level with bedroom/bathroom Alternate Level Stairs-Number of Steps: 12 Alternate Level Stairs-Rails: Right Bathroom Shower/Tub: Tub/shower unit,  Multimedia programmer: Standard Bathroom Accessibility: Yes Additional Comments: pt travels with daughter to all her [employer] basketball games/ tournments throughout the country. Pt is very active in his daughters sport and very motivated to return to being by her side. Daughter is in the 10 grade with active scouts for college looking at her for potential scholarship. 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  ADL ADL ADL Comments: Please see functional navigator Vision Baseline Vision/History: No visual deficits Patient Visual Report: No change from baseline Vision Assessment?: No apparent visual deficits Perception  Perception: Within Functional Limits Praxis Praxis: Intact Cognition Overall Cognitive Status: Within Functional Limits for tasks assessed Arousal/Alertness: Awake/alert Orientation Level: Person;Place;Situation Person: Oriented Place: Oriented Situation: Oriented Year: 2019 Month: May Day of Week:  Correct Memory: Appears intact Immediate Memory Recall: Sock;Blue;Bed Attention: Alternating Awareness: Appears intact Problem Solving: Appears intact Safety/Judgment: Impaired Comments: Pt unaware of deficits, claiming he could get to bathroom with no A, despite requiring min/mod A to stand Sensation Sensation Light Touch: Impaired Detail Additional Comments: poor light touch in the distal  LLE. phantom sensations and pain in the RLE Coordination Gross Motor Movements are Fluid and Coordinated: Yes Fine Motor Movements are Fluid and Coordinated: Yes Finger Nose Finger Test: [facility] Motor  Motor Motor: Within Functional Limits Mobility  Transfers Transfers: Sit to Stand;Stand to Sit Sit to Stand: 4: Min assist;3: Mod assist Sit to Stand Details: Verbal cues for precautions/safety;Manual facilitation for weight shifting;Verbal cues for safe use of DME/AE Stand to Sit: 4: Min assist Stand to Sit Details (indicate cue type and reason): Verbal cues for technique;Verbal cues for precautions/safety Stand to Sit Details: Pt has difficulty controlling descent without vc  Trunk/Postural Assessment  Cervical Assessment Cervical Assessment: Within Functional Limits Thoracic Assessment Thoracic Assessment: Within Functional Limits Lumbar Assessment Lumbar Assessment: Within Functional Limits Postural Control Postural Control: Within Functional Limits  Balance Balance Balance Assessed: No Static Sitting Balance Static Sitting - Level of Assistance: 6: Modified independent (Device/Increase time) Dynamic Sitting Balance Dynamic Sitting - Level of Assistance: 5: Stand by assistance Static Standing Balance Static Standing - Level of Assistance: 4: Min assist Dynamic Standing Balance Dynamic Standing - Level of Assistance: 3: Mod assist Extremity/Trunk Assessment RUE Assessment RUE Assessment: Within Functional Limits LUE Assessment LUE Assessment: Within Functional Limits   See  Function Navigator for Current Functional Status.   Refer to Care Plan for Long Term Goals  Recommendations for other services: None    Discharge Criteria: Patient will be discharged from OT if patient refuses treatment 3 consecutive times without medical reason, if treatment goals not met, if there is a change in medical status, if patient makes no progress towards goals or if patient is discharged from hospital.  The above assessment, treatment plan, treatment alternatives and goals were discussed and mutually agreed upon: by patient  Curtis Sites 06/11/2017, 2:01 PM

## 2017-06-11 NOTE — Progress Notes (Signed)
Physical Medicine and Rehabilitation Consult Reason for Consult: Decreased functional mobility Referring Physician: Dr. Sharol Given   HPI: Lance [name] is a 47 y.o. right-handed male with history of end-stage renal disease with hemodialysis, diastolic congestive heart failure, PSVT, diabetes mellitus, tobacco abuse as well as peripheral vascular disease.  Per chart review and patient, patient lives with wife and 37 year old daughter.  They have a 40 year old son in college.  One level home with planned ramp.  Wife works during the day.  He does have a mother close by who can assist.  Presented 06/06/2017 with gangrenous changes of the right foot.  Recent vascular studies by Dr. Trula Slade showed no revascularization options.  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 Sharol Given.  Wound VAC applied.  Hospital course pain management.  Hemodialysis ongoing as per renal services.  Acute on chronic anemia 10.9 and monitored.  Leukocytosis 22,000.  Physical therapy evaluation completed with recommendations of physical medicine rehab consult.  Review of Systems  Constitutional: Negative for chills and fever.  HENT: Negative for hearing loss.   Eyes: Negative for blurred vision and double vision.  Respiratory: Negative for cough and shortness of breath.   Cardiovascular: Positive for leg swelling. Negative for chest pain and palpitations.  Gastrointestinal: Positive for constipation. Negative for nausea.  Genitourinary: Negative for dysuria, flank pain and hematuria.  Musculoskeletal: Positive for joint pain and myalgias.  All other systems reviewed and are negative.      Past Medical History:  Diagnosis Date  . Aortic stenosis    Very mild in 05/2007  . Arthritis    "right shoulder; right knee; feel like I've got it all over" (11/28/2015)  . CHF (congestive heart failure) ([place])   . Degenerative joint disease    Left TKA  . Diastolic heart failure (HCC)    LVEF 65-70% with  grade 2 diastolic dysfunction  . ESRD (end stage renal disease) on dialysis ([place])    "MWF; [facility] on [address]" (11/28/2015)  . Essential hypertension, benign 2000   LVH  . GERD (gastroesophageal reflux disease)   . HCAP (healthcare-associated pneumonia) 11/28/2015  . Hyperlipidemia 2000  . Loculated pleural effusion 11/28/2015   [name] 11/28/2015  . Pneumonia 12/2013; 05/2014  . PSVT (paroxysmal supraventricular tachycardia) (HCC)    Nonsustained; asymptomatic; diagnosed by event recorder in 2006  . Tobacco abuse, in remission    20 pack years; discontinued in 1985; bronchitic changes on chest x-ray and 2009  . Type 1 diabetes mellitus ([place]) 1990        Past Surgical History:  Procedure Laterality Date  . AMPUTATION Right 06/06/2017   Procedure: RIGHT BELOW KNEE AMPUTATION;  Surgeon: [name], MD;  Location: [facility];  Service: Orthopedics;  Laterality: Right;  . AV FISTULA PLACEMENT Right 06/23/2014   Procedure: ARTERIOVENOUS (AV) FISTULA CREATION;  Surgeon: Angelia Mould, MD;  Location: [facility];  Service: Vascular;  Laterality: Right;  . CARDIAC CATHETERIZATION     [facility]- evaluation for Kidney/ pancreas transplant  . EYE SURGERY Bilateral    "for glaucoma"  . INGUINAL HERNIA REPAIR Right    As a child  . LOWER EXTREMITY ANGIOGRAPHY N/A 05/27/2017   Procedure: LOWER EXTREMITY ANGIOGRAPHY;  Surgeon: [name], MD;  Location: [facility];  Service: Cardiovascular;  Laterality: N/A;  . WISDOM TOOTH EXTRACTION  1987        Family History  Problem Relation Age of Onset  . Diabetes Father   . Hypertension Father   .  Diabetes Mother   . Hypertension Mother    Social History:  reports that he has never smoked. He has never used smokeless tobacco. He reports that he drinks alcohol. He reports that he does not use drugs. Allergies:       Allergies  Allergen Reactions  . Atorvastatin Other (See Comments)    Myalgias   .  Minoxidil Other (See Comments)    Fluid retention          Medications Prior to Admission  Medication Sig Dispense Refill  . calcium acetate (PHOSLO) 667 MG capsule Take 1 capsule by mouth 3 (three) times daily.  10  . carvedilol (COREG CR) 10 MG 24 hr capsule Take 5 mg by mouth 2 (two) times daily.     . Continuous Blood Gluc Sensor (FREESTYLE LIBRE SENSOR SYSTEM) MISC Use one sensor every 10 days. 3 each 2  . HUMALOG KWIKPEN 100 UNIT/ML KiwkPen Inject 12-18 Units into the skin 3 (three) times daily before meals.  0  . HYDROcodone-acetaminophen (NORCO/VICODIN) 5-325 MG tablet Take 1 tablet by mouth every 6 (six) hours as needed for moderate pain. 20 tablet 0  . LANTUS 100 UNIT/ML injection Inject 30 Units into the skin at bedtime.  10  . lisinopril (PRINIVIL,ZESTRIL) 20 MG tablet Take 20 mg by mouth at bedtime.     . ranitidine (ZANTAC) 75 MG tablet Take 75 mg by mouth daily as needed for heartburn.    . sucroferric oxyhydroxide (VELPHORO) 500 MG chewable tablet Chew 3 tablets by mouth 3 (three) times daily.      Home: Home Living Family/patient expects to be discharged to:: Private residence Living Arrangements: Alone Available Help at Discharge: Family, Friend(s), Available PRN/intermittently, Other (Comment)(will only be alone for ~2 hrs) Type of Home: House Home Access: Stairs to enter, Other (comment)(working on getting a ramp) Entrance Stairs-Number of Steps: 5 Entrance Stairs-Rails: Right, Left Home Layout: Able to live on main level with bedroom/bathroom Bathroom Shower/Tub: Walk-in shower Home Equipment: Crutches  Functional History: Prior Function Level of Independence: Independent Functional Status:  Mobility: Bed Mobility Overal bed mobility: Needs Assistance Bed Mobility: Supine to Sit Supine to sit: Supervision Sit to supine: Supervision General bed mobility comments: supervision for safety Transfers Overall transfer level: Needs  assistance Equipment used: Rolling walker (2 wheeled) Transfers: Sit to/from Stand Sit to Stand: Mod assist, +2 safety/equipment General transfer comment: min verbal cueing for hand placement and scooting forward to edge. Moderate assistance to boost up and steady.  Ambulation/Gait Ambulation/Gait assistance: Min guard Ambulation Distance (Feet): 25 Feet Assistive device: Rolling walker (2 wheeled) General Gait Details: Patient able to take slow, short hops using RW  ADL:  Cognition: Cognition Overall Cognitive Status: Within Functional Limits for tasks assessed Orientation Level: Oriented X4 Cognition Arousal/Alertness: Awake/alert Behavior During Therapy: WFL for tasks assessed/performed Overall Cognitive Status: Within Functional Limits for tasks assessed  Blood pressure (!) 142/80, pulse 89, temperature 98.4 F (36.9 C), temperature source Oral, resp. rate 17, height 6\' 3"  (1.905 m), weight 106.8 kg (235 lb 7.2 oz), SpO2 100 %. Physical Exam  Vitals reviewed. Constitutional: He is oriented to person, place, and time. He appears well-developed and well-nourished.  HENT:  Head: Normocephalic and atraumatic.  Eyes: EOM are normal. Right eye exhibits no discharge. Left eye exhibits no discharge.  Neck: Normal range of motion. Neck supple. No thyromegaly present.  Cardiovascular: Normal rate, regular rhythm and normal heart sounds.  Respiratory: Effort normal. No respiratory distress.  GI: Soft. Bowel  sounds are normal. He exhibits no distension.  Musculoskeletal: He exhibits tenderness (RLE).  Neurological: He is alert and oriented to person, place, and time.  Motor: 5/5 B/l UE, LLE, RLE HF Sensation intact to light touch  Skin:  BKA site is dressed with limb guard in place wound VAC  Psychiatric: He has a normal mood and affect. His behavior is normal.   Assessment/Plan: Diagnosis: Right BKA Labs independently reviewed.  Records reviewed and summated above. Clean  amputation daily with soap and water Monitor incision site for signs of infection or impending skin breakdown. Staples to remain in place for 3-4 weeks Stump shrinker, for edema control  Scar mobilization massaging to prevent soft tissue adherence Stump protector during therapies Prevent flexion contractures by implementing the following:              Encourage prone lying for 20-30 mins per day BID to avoid hip flexion       Contractures if medically appropriate;             Avoid pillow under knees when patient is lying in bed in order to prevent both        knee and hip flexion contractures;             Avoid prolonged sitting Post surgical pain control with oral medication Phantom limb pain control with physical modalities including desensitization techniques (gentle self massage to the residual stump,hot packs if sensation intact, [place]) and mirror therapy, TENS. If ineffective, consider pharmacological treatment for neuropathic pain (e.g gabapentin, pregabalin, amytriptalyine, duloxetine).  When using wheelchair, patient should have knee on amputated side fully extended with board under the seat cushion. Avoid injury to contralateral side  1. Does the need for close, 24 hr/day medical supervision in concert with the patient's rehab needs make it unreasonable for this patient to be served in a less intensive setting? Yes  2. Co-Morbidities requiring supervision/potential complications: end-stage renal disease (cont HD, recs per Nephro), diastolic congestive heart failure (monitor for signs/symptoms of fluid overload), PSVT, diabetes mellitus (Monitor in accordance with exercise and adjust meds as necessary), tobacco abuse (counsel), peripheral vascular disease, post-op pain (Biofeedback training with therapies to help reduce reliance on opiate pain medications, monitor pain control during therapies, and sedation at rest and titrate to maximum efficacy to ensure participation and gains in  therapies), Acute on chronic anemia (transfuse if necessary to ensure appropriate perfusion for increased activity tolerance), leukocytosis (cont to monitor for signs and symptoms of infection, further workup if indicated), Tachycardia (monitor in accordance with pain and increasing activity) 3. Due to safety, skin/wound care, disease management, pain management and patient education, does the patient require 24 hr/day rehab nursing? Yes 4. Does the patient require coordinated care of a physician, rehab nurse, PT (1-2 hrs/day, 5 days/week) and OT (1-2 hrs/day, 5 days/week) to address physical and functional deficits in the context of the above medical diagnosis(es)? Yes Addressing deficits in the following areas: balance, endurance, locomotion, strength, transferring, bathing, dressing, toileting and psychosocial support 5. Can the patient actively participate in an intensive therapy program of at least 3 hrs of therapy per day at least 5 days per week? Yes 6. The potential for patient to make measurable gains while on inpatient rehab is excellent 7. Anticipated functional outcomes upon discharge from inpatient rehab are modified independent  with PT, modified independent with OT, n/a with SLP. 8. Estimated rehab length of stay to reach the above functional goals is: 5-7 days. 9. Anticipated  D/C setting: Home 10. Anticipated post D/C treatments: HH therapy and Home excercise program 11. Overall Rehab/Functional Prognosis: good  RECOMMENDATIONS: This patient's condition is appropriate for continued rehabilitative care in the following setting: CIR Patient has agreed to participate in recommended program. Yes Note that insurance prior authorization may be required for reimbursement for recommended care.  Comment: Rehab Admissions Coordinator to follow up.  I have personally performed a face to face diagnostic evaluation, including, but not limited to relevant history and physical exam findings,  of this patient and developed relevant assessment and plan.  Additionally, I have reviewed and concur with the physician assistant's documentation above.   Delice Lesch, MD, ABPMR Lavon Paganini Angiulli, PA-C 06/09/2017          Revision History                        Routing History

## 2017-06-11 NOTE — Progress Notes (Signed)
[facility] Progress Note  Subjective: on hd no c/o  Vitals:   06/10/17 1520 06/10/17 2117 06/11/17 0517 06/11/17 1318  BP:  115/71 (!) 108/37 125/69  Pulse:  92 88 92  Resp:   18   Temp:   98.4 F (36.9 C) 98.7 F (37.1 C)  TempSrc:   Oral Oral  SpO2:  100% 100% 93%  Weight: 104.2 kg (229 lb 11.5 oz)     Height: 6\' 3"  (1.905 m)       Inpatient medications: . aspirin EC  325 mg Oral Daily  . calcitRIOL  0.25 mcg Oral Q M,W,F-HD  . calcium acetate  667 mg Oral TID WC  . carvedilol  3.125 mg Oral BID WC  . docusate sodium  100 mg Oral BID  . gabapentin  300 mg Oral TID  . hydrocerin   Topical BID  . insulin aspart  0-9 Units Subcutaneous TID WC  . insulin aspart  3 Units Subcutaneous TID WC  . insulin glargine  35 Units Subcutaneous QHS  . lisinopril  20 mg Oral QHS  . pantoprazole  40 mg Oral Daily  . sucroferric oxyhydroxide  1,500 mg Oral TID WC   . methocarbamol (ROBAXIN)  IV     acetaminophen, bisacodyl, methocarbamol **OR** methocarbamol (ROBAXIN)  IV, ondansetron **OR** ondansetron (ZOFRAN) IV, oxyCODONE, polyethylene glycol, sorbitol  Exam: Alert, no distress No jvd Chest cta bilat RRR Abd obese soft ntnd Ext right bka, no edema lle Nf, ox 3 Right arm avf  Dialysis: [facility] 4h  500/800  105.5kg  2k/3calc bath  p4  R avf   Hep 10000 -parsabiv 5 mg tiw iv -calcitriol 0.25 ug tiw po      Impression: 1 right foot gangrene sp bka 4/26 2 debility post bka - on CIR now 3 esrd hd mwf 4 htn - bp on low side will dc acei, cont coreg 5 anemia ckd and abl  from surg: hb 10 no need esa yet 6 mbd ckd cont vdra/ velphoro/ phoslo 7 diab type 1 per primary 8 volume - lower dry wt to around 103kg  Plan - dialysis today, dc acei  [name] MD [facility] pager [phone]   06/11/2017, 3:34 PM     Recent Labs  Lab 06/06/17 0812 06/07/17 1142 06/09/17 0737  NA 131* 130* 136  K 3.8 3.7 3.2*  CL 90* 90* 97*  CO2 21* 27  23  GLUCOSE 280* 354* 76  BUN 23* 49* 68*  CREATININE 7.08* 9.62* 13.86*  CALCIUM 8.6* 7.8* 8.1*  PHOS  --   --  3.8   Recent Labs  Lab 06/09/17 0737  ALBUMIN 2.0*   Recent Labs  Lab 06/07/17 1142 06/09/17 0737 06/10/17 0307  WBC 22.9* 19.8* 20.8*  HGB 10.9* 10.9* 10.4*  HCT 32.2* 31.7* 31.8*  MCV 98.5 97.8 100.3*  PLT 561* 640* 543*   Iron/TIBC/Ferritin/ %Sat    Component Value Date/Time   IRON 22 (L) 12/04/2015 1230   TIBC 139 (L) 12/04/2015 1230   IRONPCTSAT 16 (L) 12/04/2015 1230

## 2017-06-11 NOTE — Progress Notes (Signed)
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 Date: 04/02/15       Deduct: $900      Out of Pocket Max: $3100      Life Max: N/A CIR: 80%/20%      SNF: 80%/20% Outpatient: 90 PT/OT combined visits, 20 SLP visits      Co-Pay: $30-45 per visit  Home Health: 80%      Co-Pay: 20% DME: 80%     Co-Pay: 20% Providers: In-network   SECONDARY: Medicare A & B      Policy#: [id number]      Subscriber: Self CM Name:       Phone#:      Fax#:  Pre-Cert#: Eligible      Employer: Disabled Benefits:  Phone #: Verified online     Name: [employer]  Eff. Date: A:02/12/15 B:09/12/15     Deduct: $1364      Out of Pocket Max:       Life Max:  CIR: Covered per [facility] guidelines         Emergency Contact Information         Contact Information    Name Relation Home Work [place] Spouse [phone]  [place] Father [phone]       Current Medical History  Patient Admitting Diagnosis: Right BKA  History of Present Illness: Lance [name] is a 47 year old right-handed male with history of end-stage renal disease with hemodialysis M,W,F, diastolic congestive heart failure, PSVT, diabetes mellitus, tobacco abuse as well as peripheral vascular disease. He lives with his  wife and 65 year old daughter. They have a 79 year old son who is in college at [employer]. One level home with ramped entrance. Wife works during the day. He does have a mother and father close by who can assist. Presented 06/06/2017 with gangrenous changes of the right foot. Recent vascular studies by Dr. Trula Slade showed  no revascularization options. 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 Sharol Given. Wound VAC applied. Hospital course pain management. Hemodialysis ongoing as per renal services. Acute on chronic anemia 10.4 and monitored. Leukocytosis 20,800. Physical and occupational therapy evaluations completed with recommendations of physical medicine rehab consult. Patient was admitted for a comprehensive rehab program 06/10/17.  Past Medical History      Past Medical History:  Diagnosis Date  . Aortic stenosis    Very mild in 05/2007  . Arthritis    "right shoulder; right knee; feel like I've got it all over" (11/28/2015)  . CHF (congestive heart failure) ([place])   . Degenerative joint disease    Left TKA  . Diastolic heart failure (HCC)    LVEF 65-70% with grade 2 diastolic dysfunction  . ESRD (end stage renal disease) on dialysis ([place])    "MWF; [facility] on [address]" (11/28/2015)  . Essential hypertension, benign 2000   LVH  . GERD (gastroesophageal reflux disease)   . HCAP (healthcare-associated pneumonia) 11/28/2015  . Hyperlipidemia 2000  . Loculated pleural effusion 11/28/2015   Lance [name] 11/28/2015  . Pneumonia 12/2013; 05/2014  . PSVT (paroxysmal supraventricular tachycardia) (HCC)    Nonsustained; asymptomatic; diagnosed by event recorder in 2006  . Tobacco abuse, in remission    20 pack years; discontinued in 1985; bronchitic changes on chest x-ray and 2009  . Type 1 diabetes mellitus ([place]) 1990    Family History  family history includes Diabetes in his father and mother; Hypertension in his father and mother.  Prior  Rehab/Hospitalizations:  Has the patient had major surgery during 100 days prior to admission? No  Current Medications   Current Facility-Administered Medications:  .  0.9 %  sodium chloride infusion, , Intravenous, Continuous, [name], MD, Last Rate: 10 mL/hr at 06/06/17 1629 .  acetaminophen (TYLENOL) tablet 325-650 mg, 325-650 mg, Oral, Q6H PRN, [name], MD, 650 mg at 06/09/17 1715 .  aspirin EC tablet 325 mg, 325 mg, Oral, Daily, [name], MD, 325 mg at 06/10/17 0751 .  bisacodyl (DULCOLAX) suppository 10 mg, 10 mg, Rectal, Daily PRN, [name], MD .  calcitRIOL (ROCALTROL) capsule 0.25 mcg, 0.25 mcg, Oral, Q M,W,F-HD, Alric Seton, PA-C .  calcium acetate (PHOSLO) capsule 667 mg, 667 mg, Oral, TID WC, [name], MD, 667 mg at 06/10/17 0750 .  carvedilol (COREG) tablet 3.125 mg, 3.125 mg, Oral, BID WC, [name], MD, 3.125 mg at 06/10/17 0750 .  docusate sodium (COLACE) capsule 100 mg, 100 mg, Oral, BID, [name], MD, 100 mg at 06/09/17 2145 .  gabapentin (NEURONTIN) capsule 300 mg, 300 mg, Oral, TID, [name], MD, 300 mg at 06/09/17 2145 .  HYDROmorphone (DILAUDID) injection 0.5-1 mg, 0.5-1 mg, Intravenous, Q4H PRN, [name], MD, 0.5 mg at 06/08/17 0550 .  insulin aspart (novoLOG) injection 0-9 Units, 0-9 Units, Subcutaneous, TID WC, [name], MD, 3 Units at 06/10/17 [phone] .  insulin aspart (novoLOG) injection 3 Units, 3 Units, Subcutaneous, TID WC, [name], MD, 3 Units at 06/10/17 [phone] .  insulin glargine (LANTUS) injection 35 Units, 35 Units, Subcutaneous, QHS, [name], MD, 35 Units at 06/09/17 2146 .  lisinopril (PRINIVIL,ZESTRIL) tablet 20 mg, 20 mg, Oral, QHS, [name], MD, 20 mg at 06/09/17 2147 .  magnesium citrate solution 1 Bottle, 1 Bottle, Oral, Once PRN, [name], MD .  methocarbamol (ROBAXIN) tablet 500 mg, 500 mg, Oral, Q6H PRN,  500 mg at 06/09/17 1543 **OR** methocarbamol (ROBAXIN) 500 mg  in dextrose 5 % 50 mL IVPB, 500 mg, Intravenous, Q6H PRN, [name], MD .  metoCLOPramide (REGLAN) tablet 5-10 mg, 5-10 mg, Oral, Q8H PRN **OR** metoCLOPramide (REGLAN) injection 5-10 mg, 5-10 mg, Intravenous, Q8H PRN, [name], MD .  ondansetron (ZOFRAN) tablet 4 mg, 4 mg, Oral, Q6H PRN **OR** ondansetron (ZOFRAN) injection 4 mg, 4 mg, Intravenous, Q6H PRN, [name], MD .  oxyCODONE (Oxy IR/ROXICODONE) immediate release tablet 10-15 mg, 10-15 mg, Oral, Q4H PRN, [name], MD, 10 mg at 06/09/17 2144 .  oxyCODONE (Oxy IR/ROXICODONE) immediate release tablet 5-10 mg, 5-10 mg, Oral, Q4H PRN, [name], MD, 10 mg at 06/09/17 1016 .  polyethylene glycol (MIRALAX / GLYCOLAX) packet 17 g, 17 g, Oral, Daily PRN, [name], MD .  sucroferric oxyhydroxide [facility]) chewable tablet 1,500 mg, 1,500 mg, Oral, TID WC, [name], MD, 1,500 mg at 06/10/17 1610  Patients Current Diet:       Diet Order           Diet - low sodium heart healthy        Diet renal with fluid restriction Fluid restriction: 1200 mL Fluid; Room service appropriate? Yes; Fluid consistency: Thin  Diet effective now          Precautions / Restrictions Precautions Precautions: Fall Precaution Comments: wound VAC Other Brace/Splint: residual limb protector Restrictions Weight Bearing Restrictions: Yes RLE Weight Bearing: Non weight bearing   Has the patient had 2 or more falls or a fall with injury in the past year?Yes, 1 fall that patient reports has resulted in all this  Prior Activity Level Community (5-7x/wk): Prior to admission patient was out in the community daily, driving himself to HD, and actively following his daughter's traveling basketball team.    Development worker, international aid / [place] Devices/Equipment: CBG Meter Home Equipment: Crutches  Prior Device Use: Indicate devices/aids used by the patient prior to current illness, exacerbation or injury?  None of the above  Prior Functional Level Prior Function Level of Independence: Independent  Self Care: Did the patient need help bathing, dressing, using the toilet or eating? Independent  Indoor Mobility: Did the patient need assistance with walking from room to room (with or without device)? Independent  Stairs: Did the patient need assistance with internal or external stairs (with or without device)? Independent  Functional Cognition: Did the patient need help planning regular tasks such as shopping or remembering to take medications? Independent  Current Functional Level Cognition  Overall Cognitive Status: Impaired/Different from baseline Orientation Level: Oriented X4 Safety/Judgement: Decreased awareness of safety General Comments: pt needs education on fall risk and the reason that he can not transfer without (A) at this time    Extremity Assessment (includes Sensation/Coordination)  Upper Extremity Assessment: RUE deficits/detail, Overall WFL for tasks assessed RUE Deficits / Details: HD port present  Lower Extremity Assessment: Defer to PT evaluation RLE Deficits / Details: pt with residual limb protector in place; MMT revealed 4/5 for hip flexion, hip extension, hip abduction and hip adduction RLE: Unable to fully assess due to immobilization    ADLs  Overall ADL's : Needs assistance/impaired Eating/Feeding: Independent Grooming: Independent Grooming Details (indicate cue type and reason): brushing teeth this session Upper Body Bathing: Set up Lower Body Bathing: Moderate assistance, Sit to/from stand Lower Body Bathing Details (indicate cue type and reason): pt requires total Dependence on RW in  static standing. pt able to wash static sitting peri area anterior and thighs. Pt is unable to complete posterior peri Upper Body Dressing : Set up Lower Body Dressing: Maximal assistance Lower Body Dressing Details (indicate cue type and reason): pt able to  verbalize the reason for residual limb guard as protect the leg from injury. Pt also educated on knee extension  Toilet Transfer: Minimal assistance, +2 for safety/equipment Toilet Transfer Details (indicate cue type and reason): pt requires (A) to power up from elevated bed but able to do so. Min v/c for safety with hand placement Toileting - Clothing Manipulation Details (indicate cue type and reason): pt expressed interest to go to the bathroom alone for all transfer but educated on the need to have staff due to wound vac / fall risk General ADL Comments: pt motivated and willing to get OOB at this time.     Mobility  Overal bed mobility: Needs Assistance Bed Mobility: Supine to Sit Supine to sit: Supervision Sit to supine: Supervision General bed mobility comments: pt requires bed rail on Right side and incr time. Pt with posterior LOB x1 but self corrected    Transfers  Overall transfer level: Needs assistance Equipment used: Rolling walker (2 wheeled) Transfers: Sit to/from Stand Sit to Stand: Min assist, +2 safety/equipment General transfer comment: bed elevated and cues for hand placment    Ambulation / Gait / Stairs / Wheelchair Mobility  Ambulation/Gait Ambulation/Gait assistance: Min guard Ambulation Distance (Feet): 30 Feet Assistive device: Rolling walker (2 wheeled) General Gait Details: Hopped 30 feet (15x15) with RW; chair follow utilized.    Posture / Balance Balance Overall balance assessment: Needs assistance Sitting-balance support: Feet supported Sitting balance-Leahy Scale: Good Standing balance support: Bilateral upper extremity supported, During functional activity Standing balance-Leahy Scale: Poor Standing balance comment: total relience of bil UE on RW in static standing    Special needs/care consideration BiPAP/CPAP: No CPM: No Continuous Drip IV: No Dialysis: Yes        Days: M,W,F Life Vest: No Oxygen: No Special Bed: No Trach Size:  No Wound Vac (area): Yes      Location: Right lower extremity  Skin: Dry, Skin tear to right knee                               Bowel mgmt: Continent, last BM 06/08/17 Bladder mgmt: Continent  Diabetic mgmt: Yes, with implanted device in right upper extremity that he would scan and get CBG level. PTA took [place] in the pm and sliding scale am     Previous Home Environment Living Arrangements: Alone Available Help at Discharge: Family, Friend(s), Available PRN/intermittently, Other (Comment)(will only be alone for ~2 hrs) Type of Home: House Home Layout: Able to live on main level with bedroom/bathroom Home Access: Stairs to enter, Other (comment)(working on getting a ramp) Entrance Stairs-Rails: Right, Left Entrance Stairs-Number of Steps: 5 Bathroom Shower/Tub: Walk-in shower Additional Comments: pt travels with daughter to all her [employer] basketball games/ tournments throughout the country. Pt is very active in his daughters sport and very motivated to return to being by her side. Daughter is in the 10 grade with active scouts for college looking at her for potential scholarship. Pt very very much wants to return to this and part of the reason patient asking about "crutches"  Discharge Living Setting Plans for Discharge Living Setting: Patient's home, Lives with (comment)(Spouse and daughter, son is in college at  [employer]) Type of Home at Discharge: House Discharge Home Layout: Able to live on main level with bedroom/bathroom Discharge Home Access: Ramped entrance Discharge Bathroom Shower/Tub: Walk-in shower Discharge Bathroom Toilet: Standard Discharge Bathroom Accessibility: Yes How Accessible: Accessible via wheelchair, Accessible via walker(per pt. report ) Does the patient have any problems obtaining your medications?: No  Social/Family/Support Systems Patient Roles: Spouse, Parent, Other (Comment)(Son) Contact Information: Spouse: Engineer, manufacturing  Anticipated Caregiver: Spouse and  parents Anticipated Ambulance person Information: see above  Ability/Limitations of Caregiver: Spouse works  Careers adviser: Other (Comment)(most of the time, but not 24/7 ) Discharge Plan Discussed with Primary Caregiver: Yes Is Caregiver In Agreement with Plan?: Yes Does Caregiver/Family have Issues with Lodging/Transportation while Pt is in Rehab?: No  Goals/Additional Needs Patient/Family Goal for Rehab: PT/OT: Mod I  Expected length of stay: 5-7 days  Cultural Considerations: None Dietary Needs: Renal diet restrictions and 1200 fluid restriction  Equipment Needs: TBD Pt/Family Agrees to Admission and willing to participate: Yes Program Orientation Provided & Reviewed with Pt/Caregiver Including Roles  & Responsibilities: Yes Additional Information Needs: Pt. with HD: M,W,F PTA  Information Needs to be Provided By: Team FYI  Decrease burden of Care through IP rehab admission: No  Possible need for SNF placement upon discharge: No  Patient Condition: This patient's condition remains as documented in the consult dated 06/09/17, in which the Rehabilitation Physician determined and documented that the patient's condition is appropriate for intensive rehabilitative care in an inpatient rehabilitation facility. Will admit to inpatient rehab today.  Preadmission Screen Completed By:  Gunnar Fusi, 06/10/2017 11:36 AM ______________________________________________________________________   Discussed status with Dr. Naaman Plummer on 06/10/17 at 82 and received telephone approval for admission today.  Admission Coordinator:  Gunnar Fusi, time 1230/Date 06/10/17             Cosigned by: Meredith Staggers, MD at 06/10/2017 12:54 PM  Revision History

## 2017-06-11 NOTE — Progress Notes (Signed)
Pt vomited undigested food with therapy at 0930. Pt given oral zofran with relief. Pt called at 1100 and patient was vomiting again. Emesis was green and patient complains of nausea and feels shaky. Pt given IV zofran and PA notified. PA ordered KUB and examined pt at bedside. BS is 131. Will continue to monitor.

## 2017-06-11 NOTE — Progress Notes (Signed)
[facility]     PROGRESS NOTE  Subjective/Complaints:  Patient seen lying in bed this morning. He states he slept well overnight except for being woken up from time to temperature things. He is motivated and ready to begin therapies today. Discussed low battery on VAC with nursing.  ROS: denies CP, SOB, nausea, vomiting, diarrhea.  Objective: Vital Signs: Blood pressure (!) 108/37, pulse 88, temperature 98.4 F (36.9 C), temperature source Oral, resp. rate 18, height 6\' 3"  (1.905 m), weight 104.2 kg (229 lb 11.5 oz), SpO2 100 %. No results found. Recent Labs    06/09/17 0737 06/10/17 0307  WBC 19.8* 20.8*  HGB 10.9* 10.4*  HCT 31.7* 31.8*  PLT 640* 543*   Recent Labs    06/09/17 0737  NA 136  K 3.2*  CL 97*  GLUCOSE 76  BUN 68*  CREATININE 13.86*  CALCIUM 8.1*   CBG (last 3)  Recent Labs    06/10/17 1715 06/10/17 2049 06/11/17 0637  GLUCAP 269* 191* 196*    Wt Readings from Last 3 Encounters:  06/10/17 104.2 kg (229 lb 11.5 oz)  06/09/17 106.2 kg (234 lb 2.1 oz)  05/29/17 108.9 kg (240 lb)    Physical Exam:  BP (!) 108/37 (BP Location: Right Arm)   Pulse 88   Temp 98.4 F (36.9 C) (Oral)   Resp 18   Ht 6\' 3"  (1.905 m)   Wt 104.2 kg (229 lb 11.5 oz)   SpO2 100%   BMI 28.71 kg/m  Constitutional: No distress . Vital signs reviewed. Obese.  HENT: Normocephalic.  Atraumatic. Eyes: EOMI. No discharge. Cardiovascular: RRR. No JVD. Respiratory: CTA Bilaterally. Normal effort. GI: BS +. Non-distended. Musc: No edema. +tenderness in right BKA Neurological: He isalertand oriented  Motor: B/l UE 5/5 prox to distal.  LLE 5/5. RLE: HF 5/5 Skin: Right BKA with VAC Psychiatric: He has anormal mood and affect. Hisbehavior is normal  Assessment/Plan: 1. Functional deficits secondary to right BKA which require 3+ hours per day of interdisciplinary therapy in a comprehensive inpatient rehab setting. Physiatrist is  providing close team supervision and 24 hour management of active medical problems listed below. Physiatrist and rehab team continue to assess barriers to discharge/monitor patient progress toward functional and medical goals.  Function:  Bathing Bathing position      Bathing parts      Bathing assist        Upper Body Dressing/Undressing Upper body dressing                    Upper body assist        Lower Body Dressing/Undressing Lower body dressing                                  Lower body assist        Toileting Toileting          Toileting assist     Transfers Chair/bed transfer             Locomotion Ambulation           Wheelchair          Cognition Comprehension    Expression    Social Interaction    Problem Solving    Memory      Medical Problem List and Plan: 1.Decreased functional mobilitysecondary to right transtibial amputation 06/06/2017.   Begin CIR 2.  DVT Prophylaxis/Anticoagulation: SCDs left lower extremity 3. Pain Management:Neurontin 300 mg 3 times daily, Robaxin and oxycodone as needed 4. Mood:Provide emotional support 5. Neuropsych: This patientiscapable of making decisions on hisown behalf. 6. Skin/Wound Care:Routine skin checks  DC VAC on 5/3 7. Fluids/Electrolytes/Nutrition:Routine I/Os  8.End-stage renal disease. Continue hemodialysis as directed -HD MWF after therapy day to allow for participation in therapies 9.Acute on chronic anemia.   Hemoglobin 10.4 on 4/30   Continue to monitor 10.Hypertension. Coreg 3.125 mg twice daily, lisinopril 20 mg daily  Monitor with increased mobility 11.Diastolic congestive heart failure. Monitor for any signs of fluid overload Filed Weights   06/10/17 1520  Weight: 104.2 kg (229 lb 11.5 oz)   12,.Diabetes mellitus with peripheral neuropathy. Hemoglobin A1c 9.1. Lantus insulin 35 units nightly, NovoLog 3 units 3 times  daily. Check blood sugars before meals and at bedtime. Diabetic teaching  Monitor with increased mobility 13.Tobacco abuse. Counseling 14.Constipation. Laxative assistance 15. Leukocytosis  WBCs 20.8 on 4/30   Afebrile  Continue to monitor  LOS (Days) 1 A FACE TO FACE EVALUATION WAS PERFORMED  Ankit Lorie Phenix 06/11/2017 8:19 AM

## 2017-06-11 NOTE — Evaluation (Signed)
Physical Therapy Assessment and Plan  Patient Details  Name: Lance [name] MRN: [id number] Date of Birth: 1970/06/16  PT Diagnosis: Difficulty walking, Muscle weakness and Pain in joint Rehab Potential: Excellent ELOS: 7-10days    Today's Date: 06/11/2017 PT Individual Time: 3664-4034 AND 1135-1150 PT Individual Time Calculation (min): 60 min  AND 15 min   Problem List:  Patient Active Problem List   Diagnosis Date Noted  . Poorly controlled type 2 diabetes mellitus with peripheral neuropathy ([place])   . Essential hypertension   . Unilateral complete BKA, right, sequela ([place])   . Amputation of right lower extremity below knee ([place]) 06/10/2017  . Unilateral complete BKA, right, initial encounter ([place])   . ESRD on dialysis ([place])   . Chronic diastolic congestive heart failure ([place])   . History of PSVT (paroxysmal supraventricular tachycardia)   . Diabetes mellitus type 2 in nonobese (HCC)   . Post-operative pain   . Acute blood loss anemia   . Anemia of chronic disease   . Leukocytosis   . Tachycardia   . History of right below knee amputation ([place]) 06/06/2017  . Gangrene of right foot ([place])   . PVD (peripheral vascular disease) ([place]) 05/13/2017  . Bilateral pleural effusion 11/28/2015  . Loculated pleural effusion 11/28/2015  . HCAP (healthcare-associated pneumonia) 11/28/2015  . ESRD (end stage renal disease) on dialysis ([place]) 11/12/2015  . Hyperkalemia 11/12/2015  . Shoulder pain, acute 11/12/2015  . Nausea & vomiting 11/12/2015  . Diarrhea 11/12/2015  . Hypertensive urgency 05/05/2014  . Nephrotic syndrome 12/29/2013  . Hyponatremia 12/28/2013  . Acute diastolic heart failure ([place]) 12/28/2013  . Hypoglycemia 12/28/2013  . Dyspnea   . Acute renal failure syndrome ([place])   . Diabetic ketoacidosis without coma associated with type 1 diabetes mellitus ([place])   . Demand ischemia ([place]) 12/24/2013  . DKA (diabetic ketoacidoses) ([place]) 12/23/2013  . CAP (community acquired  pneumonia) 12/23/2013  . Sepsis due to pneumonia ([place]) 12/23/2013  . Metabolic acidemia 74/25/9563  . Acute renal failure ([place]) 12/23/2013  . Diabetic ketoacidosis ([place]) 12/23/2013  . Chronic diastolic heart failure ([place]) 04/14/2013  . PSVT (paroxysmal supraventricular tachycardia) ([place]) 04/14/2013  . Bilateral leg edema 03/26/2013  . Chronic kidney disease, stage III (moderate) ([place]) 09/29/2011  . 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 Essential hypertension, benign 09/26/2010  . Hyperlipidemia 09/26/2010    Past Medical History:  Past Medical History:  Diagnosis Date  . Aortic stenosis    Very mild in 05/2007  . Arthritis    "right shoulder; right knee; feel like I've got it all over" (11/28/2015)  . CHF (congestive heart failure) ([place])   . Degenerative joint disease    Left TKA  . Diastolic heart failure (HCC)    LVEF 65-70% with grade 2 diastolic dysfunction  . ESRD (end stage renal disease) on dialysis ([place])    "MWF; [facility] on [address]" (11/28/2015)  . Essential hypertension, benign 2000   LVH  . GERD (gastroesophageal reflux disease)   . HCAP (healthcare-associated pneumonia) 11/28/2015  . Hyperlipidemia 2000  . Loculated pleural effusion 11/28/2015   [name] 11/28/2015  . Pneumonia 12/2013; 05/2014  . PSVT (paroxysmal supraventricular tachycardia) (HCC)    Nonsustained; asymptomatic; diagnosed by event recorder in 2006  . Tobacco abuse, in remission    20 pack years; discontinued in 1985; bronchitic changes on chest x-ray and 2009  . Type 1 diabetes mellitus ([place]) 1990   Past Surgical History:  Past Surgical History:  Procedure Laterality Date  . AMPUTATION Right 06/06/2017   Procedure: RIGHT BELOW KNEE AMPUTATION;  Surgeon: [name], MD;  Location: [place];  Service: Orthopedics;  Laterality: Right;  . AV FISTULA PLACEMENT Right 06/23/2014   Procedure: ARTERIOVENOUS (AV) FISTULA CREATION;  Surgeon: Angelia Mould, MD;   Location: [place];  Service: Vascular;  Laterality: Right;  . CARDIAC CATHETERIZATION     [facility]- evaluation for Kidney/ pancreas transplant  . EYE SURGERY Bilateral    "for glaucoma"  . INGUINAL HERNIA REPAIR Right    As a child  . LOWER EXTREMITY ANGIOGRAPHY N/A 05/27/2017   Procedure: LOWER EXTREMITY ANGIOGRAPHY;  Surgeon: [name], MD;  Location: [place] [facility];  Service: Cardiovascular;  Laterality: N/A;  . WISDOM TOOTH EXTRACTION  1987    Assessment & Plan Clinical Impression: Patient is a 47 year old right-handed male with history of end-stage renal disease with hemodialysis, diastolic congestive heart failure, PSVT, diabetes mellitus, tobacco abuse as well as peripheral vascular disease.  He lives with his wife and 77 year old daughter.  They have a 35 year old son who is in college.  One level home with planned ramped entrance.  Wife works during the day.  He does have a mother close by who can assist.  Presented 06/06/2017 with gangrenous changes of the right foot.  Recent vascular studies by Dr. Trula Slade showed no revascularization options.  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 Sharol Given.  Wound VAC applied.  Hospital course pain management.  Hemodialysis ongoing as per renal services.  Acute on chronic anemia 10.4 and monitored.  Patient transferred to CIR on 06/10/2017 .   Patient currently requires mod with mobility secondary to muscle weakness and muscle joint tightness, decreased cardiorespiratoy endurance and decreased oxygen support and decreased standing balance, decreased balance strategies and difficulty maintaining precautions.  Prior to hospitalization, patient was independent  with mobility and lived with Spouse, Daughter in a House home.  Home access is  Ramped entrance.  Patient will benefit from skilled PT intervention to maximize safe functional mobility, minimize fall risk and decrease caregiver burden for planned discharge home with intermittent  assist.  Anticipate patient will benefit from follow up [facility] at discharge.  PT Assessment Rehab Potential (ACUTE/IP ONLY): Excellent PT Barriers to Discharge: Decreased caregiver support;Medical stability;Home environment access/layout;Wound Care PT Patient demonstrates impairments in the following area(s): Balance;Endurance;Pain;Safety;Sensory;Skin Integrity PT Transfers Functional Problem(s): Bed Mobility;Bed to Chair;Car;Furniture;Floor PT Locomotion Functional Problem(s): Ambulation;Wheelchair Mobility PT Plan PT Intensity: Minimum of 1-2 x/day ,45 to 90 minutes PT Frequency: 5 out of 7 days PT Duration Estimated Length of Stay: 7-10days  PT Treatment/Interventions: Ambulation/gait training;Balance/vestibular training;Community reintegration;Discharge planning;Disease management/prevention;DME/adaptive equipment instruction;Functional electrical stimulation;Functional mobility training;Pain management;Neuromuscular re-education;Patient/family education;Psychosocial support;Skin care/wound management;Splinting/orthotics;Stair training;Therapeutic Activities;Therapeutic Exercise;UE/LE Coordination activities;UE/LE Strength taining/ROM;Visual/perceptual remediation/compensation;Wheelchair propulsion/positioning PT Transfers Anticipated Outcome(s): Mod I with LRAD  PT Locomotion Anticipated Outcome(s): Mod I for household distances with LRAD  PT Recommendation Recommendations for Other Services: Therapeutic Recreation consult Therapeutic Recreation Interventions: Outing/community reintergration;Kitchen group;Pet therapy Follow Up Recommendations: Home health PT Patient destination: Home Equipment Recommended: Wheelchair cushion (measurements);Wheelchair (measurements);Rolling walker with 5" wheels  Skilled Therapeutic Intervention Pt received supine in bed and agreeable to PT. Supine>sit transfer with Supervision assis and min cues for safety. PT instructed patient in PT Evaluation and initiated  treatment intervention; see below for results. PT educated patient in [place], rehab potential, rehab goals, and discharge recommendations.  Patient returned to room and left sitting in [facility] with call bell  in reach and all needs met.    Session 2.  Pt received sitting in WC with NT and RN present for recent n/v. PT returned after 35 min to attempt treatment and pt agreeable. PT instructed car transfer with min assist and squat pivot technique to Car. Min cues for set up safety. WC mobility back to room x 123f. After 1064f pt reports sudden nausea and need for emesis bag. [facility] returned to [facility] and [facility] present of pt condition.    PT Evaluation Precautions/Restrictions   General   Vital SignsTherapy Vitals Temp: 98.4 F (36.9 C) Temp Source: Oral Pulse Rate: 88 Resp: 18 BP: (!) 108/37 Patient Position (if appropriate): Lying Oxygen Therapy SpO2: 100 % O2 Device: Room Air  Miss Minutes: 45 min: pt ill ( N/V) Pain   denies at rest Home Living/Prior FuDumontvailable Help at Discharge: Family;Friend(s);Available PRN/intermittently Type of Home: House Home Access: Ramped entrance Home Layout: Two level;Able to live on main level with bedroom/bathroom Alternate Level Stairs-Number of Steps: 12 Alternate Level Stairs-Rails: Right Bathroom Shower/Tub: Tub/shower unit;Walk-in shower Bathroom Toilet: Standard Bathroom Accessibility: Yes  Lives With: Spouse;Daughter Prior Function Level of Independence: Independent with basic ADLs;Independent with homemaking with ambulation;Independent with homemaking with wheelchair;Independent with gait  Able to Take Stairs?: Yes Driving: Yes Vocation: On disability Leisure: Hobbies-yes (Comment) Comments: does yard work.  Vision/Perception  Perception Perception: Within Functional Limits Praxis Praxis: Intact  Cognition Overall Cognitive Status: Within Functional Limits for tasks assessed Orientation Level:  Oriented X4 Memory: Appears intact Awareness: Appears intact Problem Solving: Appears intact Safety/Judgment: Appears intact Sensation Sensation Light Touch: Impaired Detail Light Touch Impaired Details: Impaired LUE;Impaired RLE Additional Comments: poor light touch in the distal LLE. phantom sensations and pain in the RLE Coordination Gross Motor Movements are Fluid and Coordinated: Yes Fine Motor Movements are Fluid and Coordinated: Yes Finger Nose Finger Test: [facility]  Motor Motor: Within Functional Limits  Mobility Bed Mobility Bed Mobility: Rolling Right;Rolling Left;Sit to Supine;Supine to Sit Rolling Right: 5: Supervision Rolling Right Details: Visual cues/gestures for precautions/safety Rolling Left: 5: Supervision Rolling Left Details: Verbal cues for precautions/safety Supine to Sit: 5: Supervision Supine to Sit Details: Verbal cues for precautions/safety Sit to Supine: 5: Supervision Sit to Supine - Details: Verbal cues for precautions/safety Transfers Transfers: Yes Sit to Stand: 4: Min assist;3: Mod assist Stand to Sit: 4: Min assist Stand to Sit Details (indicate cue type and reason): Verbal cues for technique;Verbal cues for precautions/safety Squat Pivot Transfers: 4: Min asTeacher, English as a foreign languageransfer Details: Verbal cues for technique;Verbal cues for precautions/safety Locomotion  Ambulation Ambulation: Yes Ambulation/Gait Assistance: 4: Min assist Ambulation Distance (Feet): 40 Feet Assistive device: Rolling walker Gait Gait: Yes Gait Pattern: Step-to pattern(swing to) Wheelchair Mobility Wheelchair Mobility: Yes Wheelchair Assistance: 5: Supervision Wheelchair Propulsion: Both upper extremities Distance: 15076fTrunk/Postural Assessment  Cervical Assessment Cervical Assessment: Within Functional Limits Thoracic Assessment Thoracic Assessment: Within Functional Limits Lumbar Assessment Lumbar Assessment: Within Functional Limits Postural  Control Postural Control: Within Functional Limits  Balance Balance Balance Assessed: Yes Static Sitting Balance Static Sitting - Level of Assistance: 6: Modified independent (Device/Increase time) Dynamic Sitting Balance Dynamic Sitting - Level of Assistance: 5: Stand by assistance Static Standing Balance Static Standing - Level of Assistance: 4: Min assist(BUE support ) Dynamic Standing Balance Dynamic Standing - Level of Assistance: 3: Mod assist(1 UE support ) Extremity Assessment      RLE Assessment RLE Assessment: Exceptions to  WFL RLE AROM (degrees) RLE Overall AROM Comments: knee flexion ~80degrees full knee extension RLE Strength RLE Overall Strength Comments: 4+/5 hip flexion, adduction, abduction, and extension. at least 3/5 kne flexion/extension.  LLE Assessment LLE Assessment: Within Functional Limits   See Function Navigator for Current Functional Status.   Refer to Care Plan for Long Term Goals  Recommendations for other services: Neuropsych and Therapeutic Recreation  Kitchen group and Outing/community reintegration  Discharge Criteria: Patient will be discharged from PT if patient refuses treatment 3 consecutive times without medical reason, if treatment goals not met, if there is a change in medical status, if patient makes no progress towards goals or if patient is discharged from hospital.  The above assessment, treatment plan, treatment alternatives and goals were discussed and mutually agreed upon: by patient  Lorie Phenix 06/11/2017, 9:00 AM

## 2017-06-12 ENCOUNTER — Inpatient Hospital Stay (HOSPITAL_COMMUNITY): Payer: BLUE CROSS/BLUE SHIELD | Admitting: Occupational Therapy

## 2017-06-12 ENCOUNTER — Inpatient Hospital Stay (HOSPITAL_COMMUNITY): Payer: Medicare Other | Admitting: Physical Therapy

## 2017-06-12 ENCOUNTER — Ambulatory Visit (INDEPENDENT_AMBULATORY_CARE_PROVIDER_SITE_OTHER): Payer: BLUE CROSS/BLUE SHIELD

## 2017-06-12 DIAGNOSIS — R1112 Projectile vomiting: Secondary | ICD-10-CM

## 2017-06-12 DIAGNOSIS — R1110 Vomiting, unspecified: Secondary | ICD-10-CM

## 2017-06-12 DIAGNOSIS — G546 Phantom limb syndrome with pain: Secondary | ICD-10-CM

## 2017-06-12 LAB — GLUCOSE, CAPILLARY
GLUCOSE-CAPILLARY: 109 mg/dL — AB (ref 65–99)
GLUCOSE-CAPILLARY: 74 mg/dL (ref 65–99)
GLUCOSE-CAPILLARY: 236 mg/dL — AB (ref 65–99)
Glucose-Capillary: 233 mg/dL — ABNORMAL HIGH (ref 65–99)

## 2017-06-12 MED ORDER — GABAPENTIN 100 MG PO CAPS
100.0000 mg | ORAL_CAPSULE | Freq: Every day | ORAL | Status: DC
  Administered 2017-06-12 – 2017-06-18 (×7): 100 mg via ORAL
  Filled 2017-06-12 (×7): qty 1

## 2017-06-12 MED ORDER — GABAPENTIN 300 MG PO CAPS
300.0000 mg | ORAL_CAPSULE | ORAL | Status: DC
  Administered 2017-06-13 – 2017-06-18 (×3): 300 mg via ORAL
  Filled 2017-06-12 (×4): qty 1

## 2017-06-12 NOTE — Progress Notes (Signed)
Physical Therapy Session Note  Patient Details  Name: Lance [name] MRN: [id number] Date of Birth: 1971-01-27  Today's Date: 06/12/2017 PT Individual Time: 0805-0905 AND 7001-7494 PT Individual Time Calculation (min): 60 min AND 70 min  Short Term Goals: Week 1:  PT Short Term Goal 1 (Week 1): STG=LTG due to ELOS   Skilled Therapeutic Interventions/Progress Updates:   Pt received supine in bed and agreeable to PT. Supine>sit transfer without assist or cues   PT re-wrapped R residual limb. Squat pivot transfer to [facility] with supervision assist from PT.   WC mobility to and from rehab gym with supervision assist. Min cues for [facility] management of tight space in room.   Sit<>stand from [facility] with supervision assist x 10 throughout treatment. Min cues for set up and safety with transfers.   Gait training 2 x 35 ft with supervision assist and min cues for improved use of UE to reduce stress on sound LE  Standing balance to perform lateral and cross body reach x 10 BUE. Supervision assist from PT and min cues for proper use of UE support to maintain balance.   Patient returned to room and left sitting in [facility] with call bell in reach and all needs met.     Session 2.   Pt received sitting in WC and agreeable to PT. WC mobility x 175f with supervision assist from PT. .   Endurance training on Nustep for BUE and LLE. 8 min +5 min with 1 rest break. Min cues for full rom and proper speed, level 4 and 7.   Stand pivot transfer and sit<>stand transfers from various services with supervision assist throuhgout treatmnet. Gait training 332fx 2 with supervision assist from PT. Min cues for AD management in turns and for increased step length as tolerated.   UE strengthening/endurance ball tap with 5lb weight bar, 1 min x 3. Trunk rotation with therapy ball 2 x 1 min. Diagonal trunk rotation 2 x 30sec bil.    Pt returned to room and performed stand pivot transfer to bed with supervision assist.  Sit>supine completed with out assist, and left supine in bed with call bell in reach and all needs met.          Therapy Documentation Precautions:  Precautions Precautions: Fall Precaution Comments: wound VAC Required Braces or Orthoses: Other Brace/Splint Other Brace/Splint: residual limb protector Restrictions Weight Bearing Restrictions: Yes RLE Weight Bearing: Non weight bearing Other Position/Activity Restrictions: R LE Vital Signs: Therapy Vitals Temp: 98.7 F (37.1 C) Temp Source: Oral Pulse Rate: 87 Resp: 18 BP: 130/76 Patient Position (if appropriate): Lying Oxygen Therapy SpO2: 99 % O2 Device: Room Air Pain: Denies.   See Function Navigator for Current Functional Status.   Therapy/Group: Individual Therapy  AuLorie Phenix/03/2017, 9:08 AM

## 2017-06-12 NOTE — Patient Care Conference (Signed)
Inpatient RehabilitationTeam Conference and Plan of Care Update Date: 06/11/2017   Time: 2:40 PM    Patient Name: Lance [name]      Medical Record Number: [id number]  Date of Birth: 10-11-1970 Sex: Male         Room/Bed: 4M06C/4M06C-01 Payor Info: Payor: BLUE CROSS BLUE SHIELD / Plan: BCBS OTHER / Product Type: *No Product type* /    Admitting Diagnosis: rt bka  Admit Date/Time:  06/10/2017  3:12 PM Admission Comments: No comment available   Primary Diagnosis:  <principal problem not specified> Principal Problem: <principal problem not specified>  Patient Active Problem List   Diagnosis Date Noted  . Vomiting   . Phantom limb pain ([place])   . Poorly controlled type 2 diabetes mellitus with peripheral neuropathy ([place])   . Essential hypertension   . Unilateral complete BKA, right, sequela ([place])   . Amputation of right lower extremity below knee ([place]) 06/10/2017  . Unilateral complete BKA, right, initial encounter ([place])   . ESRD on dialysis ([place])   . Chronic diastolic congestive heart failure ([place])   . History of PSVT (paroxysmal supraventricular tachycardia)   . Diabetes mellitus type 2 in nonobese (HCC)   . Post-operative pain   . Acute blood loss anemia   . Anemia of chronic disease   . Leukocytosis   . Tachycardia   . History of right below knee amputation ([place]) 06/06/2017  . Gangrene of right foot ([place])   . PVD (peripheral vascular disease) ([place]) 05/13/2017  . Bilateral pleural effusion 11/28/2015  . Loculated pleural effusion 11/28/2015  . HCAP (healthcare-associated pneumonia) 11/28/2015  . ESRD (end stage renal disease) on dialysis ([place]) 11/12/2015  . Hyperkalemia 11/12/2015  . Shoulder pain, acute 11/12/2015  . Nausea & vomiting 11/12/2015  . Diarrhea 11/12/2015  . Hypertensive urgency 05/05/2014  . Nephrotic syndrome 12/29/2013  . Hyponatremia 12/28/2013  . Acute diastolic heart failure ([place]) 12/28/2013  . Hypoglycemia 12/28/2013  . Dyspnea   . Acute renal  failure syndrome ([place])   . Diabetic ketoacidosis without coma associated with type 1 diabetes mellitus ([place])   . Demand ischemia ([place]) 12/24/2013  . DKA (diabetic ketoacidoses) ([place]) 12/23/2013  . CAP (community acquired pneumonia) 12/23/2013  . Sepsis due to pneumonia ([place]) 12/23/2013  . Metabolic acidemia 26/37/8588  . Acute renal failure ([place]) 12/23/2013  . Diabetic ketoacidosis ([place]) 12/23/2013  . Chronic diastolic heart failure ([place]) 04/14/2013  . PSVT (paroxysmal supraventricular tachycardia) ([place]) 04/14/2013  . Bilateral leg edema 03/26/2013  . Chronic kidney disease, stage [name] (moderate) ([place]) 09/29/2011  . 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 Essential hypertension, benign 09/26/2010  . Hyperlipidemia 09/26/2010    Expected Discharge Date: Expected Discharge Date: 06/19/17  Team Members Present: Physician leading conference: Dr. Delice Lesch Social Worker Present: Alfonse Alpers, LCSW Nurse Present: Rayetta Pigg, RN PT Present: Barrie Folk, PT OT Present: Simonne Come, OT SLP Present: Windell Moulding, SLP PPS Coordinator present : Daiva Nakayama, RN, CRRN     Current Status/Progress Goal Weekly Team Focus  Medical   Decreased functional mobility secondary to right transtibial amputation 06/06/2017  Improve mobility, transfers, leukocytosis, DM/HTN, anemia, ESRD  See above   Bowel/Bladder   Continent of Bladder/Bowel, LBM 06/10/2017, Patient is Oliguria ,ESRD Hemodialysis M-W-F  Remain continent of Bladder/Bowel  Assess QS and prn   Swallow/Nutrition/ Hydration             ADL's   Min assist transfers, supervision/setup UB  bathing/dressing, min assist standing balance  Mod I overall  ADL retraining, endurance   Mobility   Min-mod assist sit<>stand transfer and min assist squat pivot transfers. Min assist gait up to 87ft. Supervision assist WC mobility.   Mod I transfers with LRAD. Supervision assist gait up to 3ft with LRAD   balance,  endurance, safety with gait and transfers.    Communication             Safety/Cognition/ Behavioral Observations            Pain   Denies pain s/p Right BKA  Pain < 3  QS assess pain with f/u, administer prn medication as required by patient upon request, notify MD/PA if concerns or questions     Skin   Surgical incision to Rt. BKA with Wound Vac scanty amt. of drainage,   No signs/symptoms of injuries or infection -healing process remains intact  Assess QS and prn Educate with teach back to patient and Families on s/s of infection, wound care,complications, etc    Rehab Goals Patient on target to meet rehab goals: Yes Rehab Goals Revised: none *See Care Plan and progress notes for long and short-term goals.     Barriers to Discharge  Current Status/Progress Possible Resolutions Date Resolved   Physician    Medical stability;Wound Care;Weight bearing restrictions     See above  Therapies, follow labs, optimize DM/HTN meds, dialysis      Nursing                  PT  Decreased caregiver support;Medical stability;Home environment access/layout;Wound Care                 OT                  SLP                SW                Discharge Planning/Teaching Needs:  Pt plans to return to his home with intermittent assistance from his family.  Family education to be offered closer to pt's d/c.   Team Discussion:  Pt with chronic anemia, on dialysis, DM, needing BP control, slightly elevated white count and now with new right BKA.  Dr. Posey Pronto is watching his labs and making medication adjustments.  VAC to likely come off on Friday.  RN stated that pt has been vomiting all day and Zofran is helping; KUB negative.  IV started and this may be helping pt feel better.  Legs are wrapped in ACE bandage.  Pt doesn't like to take pain medication.  Pt is doing well with PT at mod A to stand and then min A by end of evaluation.  Min A for squat pivot tx and 40' ambulation; S for bed mobility.  Pt  fatigues easily.  OT said pt is min A txs, refused LB bathing and dressing; has mod I goals.  Revisions to Treatment Plan:  none    Continued Need for Acute Rehabilitation Level of Care: The patient requires daily medical management by a physician with specialized training in physical medicine and rehabilitation for the following conditions: Daily direction of a multidisciplinary physical rehabilitation program to ensure safe treatment while eliciting the highest outcome that is of practical value to the patient.: Yes Daily medical management of patient stability for increased activity during participation in an intensive rehabilitation regime.: Yes Daily analysis of laboratory values and/or radiology  reports with any subsequent need for medication adjustment of medical intervention for : Wound care problems;Diabetes problems;Blood pressure problems;Renal problems;Other  Kaela Beitz, Silvestre Mesi 06/12/2017, 1:03 PM

## 2017-06-12 NOTE — Progress Notes (Signed)
[facility] PHYSICAL MEDICINE & REHABILITATION     PROGRESS NOTE  Subjective/Complaints:  Patient seen lying in bed this morning. He states he slept well overnight. He states he had a rough first in therapies yesterday because he had nausea and vomiting. He states he feels back to normal this morning. KUB reviewed, unremarkable. He does complain of phantom limb pain.  ROS: + Phantom limb pain. Denies CP, SOB, nausea, vomiting, diarrhea.  Objective: Vital Signs: Blood pressure 130/76, pulse 87, temperature 98.7 F (37.1 C), temperature source Oral, resp. rate 18, height 6\' 3"  (1.905 m), weight 104.6 kg (230 lb 9.6 oz), SpO2 99 %. Dg Abd 1 View  Result Date: 06/11/2017 CLINICAL DATA:  Nausea, vomiting. EXAM: ABDOMEN - 1 VIEW COMPARISON:  Radiographs November 15, 2015. FINDINGS: The bowel gas pattern is normal. No radio-opaque calculi or other significant radiographic abnormality are seen. IMPRESSION: No evidence of bowel obstruction or ileus. Electronically Signed   By: Marijo Conception, M.D.   On: 06/11/2017 13:45   Recent Labs    06/10/17 0307 06/11/17 1639  WBC 20.8* 19.8*  HGB 10.4* 10.0*  HCT 31.8* 30.6*  PLT 543* 592*   Recent Labs    06/11/17 1639  NA 133*  K 4.4  CL 90*  GLUCOSE 115*  BUN 61*  CREATININE 13.10*  CALCIUM 8.3*   CBG (last 3)  Recent Labs    06/11/17 1812 06/11/17 2131 06/12/17 0635  GLUCAP 91 189* 233*    Wt Readings from Last 3 Encounters:  06/12/17 104.6 kg (230 lb 9.6 oz)  06/09/17 106.2 kg (234 lb 2.1 oz)  05/29/17 108.9 kg (240 lb)    Physical Exam:  BP 130/76 (BP Location: Left Arm)   Pulse 87   Temp 98.7 F (37.1 C) (Oral)   Resp 18   Ht 6\' 3"  (1.905 m)   Wt 104.6 kg (230 lb 9.6 oz)   SpO2 99%   BMI 28.82 kg/m  Constitutional: No distress . Vital signs reviewed. Well-developed. HENT: Normocephalic.  Atraumatic. Eyes: EOMI. No discharge. Cardiovascular: RRR. No JVD. Respiratory: CTA Bilaterally. Normal effort. GI: BS +.  Non-distended. Musc: No edema. +tenderness in right BKA Neurological: He isalertand oriented  Motor: B/l UE 5/5 prox to distal.  LLE 5/5. RLE: HF 4+/5 (pain inhibition) Skin: Right BKA with VAC Psychiatric: He has anormal mood and affect. Hisbehavior is normal  Assessment/Plan: 1. Functional deficits secondary to right BKA which require 3+ hours per day of interdisciplinary therapy in a comprehensive inpatient rehab setting. Physiatrist is providing close team supervision and 24 hour management of active medical problems listed below. Physiatrist and rehab team continue to assess barriers to discharge/monitor patient progress toward functional and medical goals.  Function:  Bathing Bathing position      Bathing parts Body parts bathed by patient: Right arm, Left arm, Chest, Abdomen    Bathing assist Assist Level: Supervision or verbal cues      Upper Body Dressing/Undressing Upper body dressing   What is the patient wearing?: Pull over shirt/dress     Pull over shirt/dress - Perfomed by patient: Thread/unthread right sleeve, Thread/unthread left sleeve, Put head through opening, Pull shirt over trunk          Upper body assist Assist Level: Set up      Lower Body Dressing/Undressing Lower body dressing Lower body dressing/undressing activity did not occur: Refused  Lower body assist Assist for lower body dressing: Touching or steadying assistance (Pt > 75%)      Toileting Toileting   Toileting steps completed by patient: Adjust clothing prior to toileting, Performs perineal hygiene, Adjust clothing after toileting      Toileting assist Assist level: Touching or steadying assistance (Pt.75%)(per OT note)   Transfers Chair/bed transfer   Chair/bed transfer method: Squat pivot Chair/bed transfer assist level: Touching or steadying assistance (Pt > 75%) Chair/bed transfer assistive device: Armrests      Locomotion Ambulation     Max distance: 10' Assist level: Touching or steadying assistance (Pt > 75%)   Wheelchair   Type: Manual Max wheelchair distance: 150 Assist Level: Supervision or verbal cues  Cognition Comprehension Comprehension assist level: Follows complex conversation/direction with no assist  Expression Expression assist level: Expresses complex ideas: With no assist  Social Interaction Social Interaction assist level: Interacts appropriately with others - No medications needed.  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   Continue CIR 2. DVT Prophylaxis/Anticoagulation: SCDs left lower extremity 3. Pain Management/Phantom limb pain:Robaxin and oxycodone as needed  Gabapentin changed to 100 mg daily and 300 mg after HD on 5/2 4. Mood:Provide emotional support 5. Neuropsych: This patientiscapable of making decisions on hisown behalf. 6. Skin/Wound Care:Routine skin checks  DC VAC on 5/3 7. Fluids/Electrolytes/Nutrition:Routine I/Os  8.End-stage renal disease. Continue hemodialysis as directed -HD MWF after therapy day to allow for participation in therapies 9.Acute on chronic anemia.   Hemoglobin 10.0 on 5/1   Continue to monitor 10.Hypertension. Coreg 3.125 mg twice daily, lisinopril 20 mg daily  Controlled on 5/2 11.Diastolic congestive heart failure. Monitor for any signs of fluid overload Filed Weights   06/11/17 1415 06/11/17 1732 06/12/17 0543  Weight: 103.4 kg (227 lb 15.3 oz) 102.2 kg (225 lb 5 oz) 104.6 kg (230 lb 9.6 oz)   ?Trending up  Management per nephro 12,.Diabetes mellitus with peripheral neuropathy. Hemoglobin A1c 9.1.   Lantus insulin 35 units nightly,  Will consider further increase  tomorrow  NovoLog 3 units 3 times daily.   Check blood sugars before meals and at bedtime. Diabetic teaching  Labile on 5/2  Monitor with increased mobility 13.Tobacco abuse. Counseling 14.Constipation. Laxative assistance 15. Leukocytosis  WBCs 19.8 on 5/1   Afebrile  Continue to monitor 16. Nausea/vomiting  Appears to have improved  KUB reviewed, unremarkable  LOS (Days) 2 A FACE TO FACE EVALUATION WAS PERFORMED  Ankit Lorie Phenix 06/12/2017 8:32 AM

## 2017-06-12 NOTE — Progress Notes (Signed)
[facility] Individual Statement of Services  Patient Name:  Lance [name]  Date:  06/12/2017  Welcome to the [facility].  Our goal is to provide you with an individualized program based on your diagnosis and situation, designed to meet your specific needs.  With this comprehensive rehabilitation program, you will be expected to participate in at least 3 hours of rehabilitation therapies Monday-Friday, with modified therapy programming on the weekends.  Your rehabilitation program will include the following services:  Physical Therapy (PT), Occupational Therapy (OT), 24 hour per day rehabilitation nursing, Case Management (Social Worker), Rehabilitation Medicine, Nutrition Services and Pharmacy Services  Weekly team conferences will be held on Wednesdays to discuss your progress.  Your Social Worker will talk with you frequently to get your input and to update you on team discussions.  Team conferences with you and your family in attendance may also be held.  Expected length of stay:  7 to 10 days  Overall anticipated outcome:  Modified independent with minimal assistance for stairs  Depending on your progress and recovery, your program may change. Your Social Worker will coordinate services and will keep you informed of any changes. Your Social Worker's name and contact numbers are listed  below.  The following services may also be recommended but are not provided by the [facility] will be made to provide these services after discharge if needed.  Arrangements include referral to agencies that provide these services.  Your insurance has been verified to be:  [employer] Your primary doctor is:  Dr. Baltazar Apo  Pertinent information will be shared with your doctor and your  insurance company.  Social Worker:  Alfonse Alpers, LCSW  [phone] or [phone]  Information discussed with and copy given to patient by: Trey Sailors, 06/12/2017, 3:13 PM

## 2017-06-12 NOTE — Progress Notes (Signed)
Occupational Therapy Session Note  Patient Details  Name: Lance [name] MRN: [id number] Date of Birth: September 02, 1970  Today's Date: 06/12/2017 OT Individual Time: 1105-1200 OT Individual Time Calculation (min): 55 min    Short Term Goals: STG = LTGs  Skilled Therapeutic Interventions/Progress Updates:    Treatment session with focus on dynamic standing balance and RLE ROM.  Pt received upright in w/c declining bathing and dressing, stating that his mom was bringing him clothes this afternoon and would do it later.  Discussed plan to engage in bathing at shower level once wound vac removed with pt agreeable to plan.  Pt propelled w/c 150' supervision for BUE strengthening and endurance.  Ambulated short distance with RW to therapy mat with min guard.  Engaged in standing activity to challenge standing balance and endurance while alternating UE support on RW or table surface.  Min guard for standing with exception of min assist one instance when pt removed BUE for support leaving him only standing on LLE.  Engaged in RLE ROM exercises 2 sets of 10 each to maintain ROM to allow for later fit for prosthesis.  Engaged in bridging, hip flexion, hamstring stretch in supine and hip flexion/extension and abduction in sidelying.  Pt returned to room as above and left upright in w/c and setup for lunch.  Therapy Documentation Precautions:  Precautions Precautions: Fall Precaution Comments: wound VAC Required Braces or Orthoses: Other Brace/Splint Other Brace/Splint: residual limb protector Restrictions Weight Bearing Restrictions: Yes RLE Weight Bearing: Non weight bearing Other Position/Activity Restrictions: R LE General:   Vital Signs:   Pain: Pain Assessment Pain Scale: 0-10 Pain Score: 5  Pain Type: Acute pain;Surgical pain Pain Location: Knee Pain Orientation: Right Pain Intervention(s): Medication (See eMAR)  See Function Navigator for Current Functional  Status.   Therapy/Group: Individual Therapy  Simonne Come 06/12/2017, 12:12 PM

## 2017-06-12 NOTE — Progress Notes (Signed)
[facility] Progress Note  Subjective: seen in room  Vitals:   06/11/17 1815 06/11/17 2035 06/12/17 0543 06/12/17 1409  BP: (!) 122/51 109/66 130/76 128/67  Pulse: (!) 102 97 87 91  Resp: 16  18   Temp: 98.4 F (36.9 C)  98.7 F (37.1 C) 98.6 F (37 C)  TempSrc: Oral  Oral Oral  SpO2: 100% 100% 99% 100%  Weight:   104.6 kg (230 lb 9.6 oz)   Height:        Inpatient medications: . aspirin EC  325 mg Oral Daily  . calcitRIOL  0.25 mcg Oral Q M,W,F-HD  . calcium acetate  667 mg Oral TID WC  . carvedilol  3.125 mg Oral BID WC  . docusate sodium  100 mg Oral BID  . gabapentin  100 mg Oral Daily  . [START ON 06/13/2017] gabapentin  300 mg Oral Q M,W,F-HD  . hydrocerin   Topical BID  . insulin aspart  0-9 Units Subcutaneous TID WC  . insulin aspart  3 Units Subcutaneous TID WC  . insulin glargine  35 Units Subcutaneous QHS  . pantoprazole  40 mg Oral Daily  . sucroferric oxyhydroxide  1,500 mg Oral TID WC   . sodium chloride    . sodium chloride    . methocarbamol (ROBAXIN)  IV     sodium chloride, sodium chloride, acetaminophen, bisacodyl, heparin, heparin, hydrocerin, lidocaine (PF), lidocaine-prilocaine, methocarbamol **OR** methocarbamol (ROBAXIN)  IV, ondansetron **OR** ondansetron (ZOFRAN) IV, oxyCODONE, pentafluoroprop-tetrafluoroeth, polyethylene glycol, sorbitol  Exam: Alert, no distress No jvd Chest cta bilat RRR Abd obese soft ntnd Ext right bka, no edema lle Nf, ox 3 Right arm avf  Dialysis: [facility] 4h  500/800  105.5kg  2k/3calc bath  p4  R avf   Hep 10000 -parsabiv 5 mg tiw iv -calcitriol 0.25 ug tiw po      Impression: 1 right foot gangrene sp bka 4/26 2 debility post bka - on CIR now 3 esrd hd mwf 4 htn - bp on low side will dc acei, cont coreg 5 anemia ckd and abl  from surg -hb 10 no need esa 6 mbd ckd - cont vdra/ velphoro/ phoslo 7 diab type 1- per primary 8 volume - lower dry wt to around 103kg  Plan - dialysis friday    [name] MD [facility] pager [phone]   06/12/2017, 4:43 PM     Recent Labs  Lab 06/07/17 1142 06/09/17 0737 06/11/17 1639  NA 130* 136 133*  K 3.7 3.2* 4.4  CL 90* 97* 90*  CO2 27 23 28   GLUCOSE 354* 76 115*  BUN 49* 68* 61*  CREATININE 9.62* 13.86* 13.10*  CALCIUM 7.8* 8.1* 8.3*  PHOS  --  3.8 4.2   Recent Labs  Lab 06/09/17 0737 06/11/17 1639  ALBUMIN 2.0* 2.1*   Recent Labs  Lab 06/09/17 0737 06/10/17 0307 06/11/17 1639  WBC 19.8* 20.8* 19.8*  HGB 10.9* 10.4* 10.0*  HCT 31.7* 31.8* 30.6*  MCV 97.8 100.3* 101.7*  PLT 640* 543* 592*   Iron/TIBC/Ferritin/ %Sat    Component Value Date/Time   IRON 22 (L) 12/04/2015 1230   TIBC 139 (L) 12/04/2015 1230   IRONPCTSAT 16 (L) 12/04/2015 1230

## 2017-06-12 NOTE — Progress Notes (Signed)
Social Work Assessment and Plan  Patient Details  Name: Lance [name] MRN: [id number] Date of Birth: 11-Feb-1971  Today's Date: 06/12/2017  Problem List:  Patient Active Problem List   Diagnosis Date Noted  . Vomiting   . Phantom limb pain ([place])   . Poorly controlled type 2 diabetes mellitus with peripheral neuropathy ([place])   . Essential hypertension   . Unilateral complete BKA, right, sequela ([place])   . Amputation of right lower extremity below knee ([place]) 06/10/2017  . Unilateral complete BKA, right, initial encounter ([place])   . ESRD on dialysis ([place])   . Chronic diastolic congestive heart failure ([place])   . History of PSVT (paroxysmal supraventricular tachycardia)   . Diabetes mellitus type 2 in nonobese (HCC)   . Post-operative pain   . Acute blood loss anemia   . Anemia of chronic disease   . Leukocytosis   . Tachycardia   . History of right below knee amputation ([place]) 06/06/2017  . Gangrene of right foot ([place])   . PVD (peripheral vascular disease) ([place]) 05/13/2017  . Bilateral pleural effusion 11/28/2015  . Loculated pleural effusion 11/28/2015  . HCAP (healthcare-associated pneumonia) 11/28/2015  . ESRD (end stage renal disease) on dialysis ([place]) 11/12/2015  . Hyperkalemia 11/12/2015  . Shoulder pain, acute 11/12/2015  . Nausea & vomiting 11/12/2015  . Diarrhea 11/12/2015  . Hypertensive urgency 05/05/2014  . Nephrotic syndrome 12/29/2013  . Hyponatremia 12/28/2013  . Acute diastolic heart failure ([place]) 12/28/2013  . Hypoglycemia 12/28/2013  . Dyspnea   . Acute renal failure syndrome ([place])   . Diabetic ketoacidosis without coma associated with type 1 diabetes mellitus ([place])   . Demand ischemia ([place]) 12/24/2013  . DKA (diabetic ketoacidoses) ([place]) 12/23/2013  . CAP (community acquired pneumonia) 12/23/2013  . Sepsis due to pneumonia ([place]) 12/23/2013  . Metabolic acidemia 29/79/8921  . Acute renal failure ([place]) 12/23/2013  . Diabetic ketoacidosis ([place])  12/23/2013  . Chronic diastolic heart failure ([place]) 04/14/2013  . PSVT (paroxysmal supraventricular tachycardia) ([place]) 04/14/2013  . Bilateral leg edema 03/26/2013  . Chronic kidney disease, stage III (moderate) ([place]) 09/29/2011  . Type 1 diabetes mellitus with end-stage renal disease ([place]) 09/26/2010  . Essential hypertension, benign 09/26/2010  . Hyperlipidemia 09/26/2010   Past Medical History:  Past Medical History:  Diagnosis Date  . Aortic stenosis    Very mild in 05/2007  . Arthritis    "right shoulder; right knee; feel like I've got it all over" (11/28/2015)  . CHF (congestive heart failure) ([place])   . Degenerative joint disease    Left TKA  . Diastolic heart failure (HCC)    LVEF 65-70% with grade 2 diastolic dysfunction  . ESRD (end stage renal disease) on dialysis ([place])    "MWF; [facility] [address]" (11/28/2015)  . Essential hypertension, benign 2000   LVH  . GERD (gastroesophageal reflux disease)   . HCAP (healthcare-associated pneumonia) 11/28/2015  . Hyperlipidemia 2000  . Loculated pleural effusion 11/28/2015   [name] 11/28/2015  . Pneumonia 12/2013; 05/2014  . PSVT (paroxysmal supraventricular tachycardia) (HCC)    Nonsustained; asymptomatic; diagnosed by event recorder in 2006  . Tobacco abuse, in remission    20 pack years; discontinued in 1985; bronchitic changes on chest x-ray and 2009  . Type 1 diabetes mellitus ([place]) 1990   Past Surgical History:  Past Surgical History:  Procedure Laterality Date  . AMPUTATION Right 06/06/2017   Procedure: RIGHT BELOW KNEE AMPUTATION;  Surgeon: [name], MD;  Location: [facility];  Service: Orthopedics;  Laterality: Right;  . AV FISTULA PLACEMENT Right 06/23/2014   Procedure: ARTERIOVENOUS (AV) FISTULA CREATION;  Surgeon: Angelia Mould, MD;  Location: [facility];  Service: Vascular;  Laterality: Right;  . CARDIAC CATHETERIZATION     [facility]- evaluation for Kidney/ pancreas transplant  . EYE SURGERY Bilateral    "for  glaucoma"  . INGUINAL HERNIA REPAIR Right    As a child  . LOWER EXTREMITY ANGIOGRAPHY N/A 05/27/2017   Procedure: LOWER EXTREMITY ANGIOGRAPHY;  Surgeon: [name], MD;  Location: [facility];  Service: Cardiovascular;  Laterality: N/A;  . WISDOM TOOTH EXTRACTION  1987   Social History:  reports that he has never smoked. He has never used smokeless tobacco. He reports that he drinks alcohol. He reports that he does not use drugs.  Family / Support Systems Marital Status: Married How Long?: 20 years Patient Roles: Spouse, Parent, Other (Comment)(son; friend; neighbor) Spouse/Significant Other: Lance [name] - wife - [phone] (h); [phone] (m) Other Supports: Lance and Lance [name] - parents - [phone] Anticipated Caregiver: Spouse and parents Ability/Limitations of Caregiver: Spouse works  Careers adviser: Other (Comment)(help available most of the time, but not someone with him 24/7) Family Dynamics: close, supportive family  Social History Preferred language: English Religion: Baptist Education: college Read: Yes Write: Yes Employment Status: Disabled Date Retired/Disabled/Unemployed: about 2 years ago Public relations account executive Issues: none reported Guardian/Conservator: N/A - MD has determined that pt is capable of making his own decisions.   Abuse/Neglect Abuse/Neglect Assessment Can Be Completed: Yes Physical Abuse: Denies Verbal Abuse: Denies Sexual Abuse: Denies Exploitation of patient/patient's resources: Denies Self-Neglect: Denies  Emotional Status Pt's affect, behavior and adjustment status: Pt reports staying upbeat and motivated to get better and get his prosthesis.  Pt played sports and is used to being competitive.  He is using this competitive spirit to help him get through this recovery. Recent Psychosocial Issues: Pt has been going to hemodialysis and was preparing for to start PD training on May 6th, which is now  on hold and he is very disappointed about this.  He will continue to advocate for himself to get this process back on track. Psychiatric History: none reported Substance Abuse History: none reported  Patient / Family Perceptions, Expectations & Goals Pt/Family understanding of illness & functional limitations: Pt has a good understanding of his condition and is a great self advocate.  He prides himself on staying up on his care and is upset with some of his doctors for making decisions and not involving /updating him. Premorbid pt/family roles/activities: Pt likes to be with friends/family; attends church; watches his dtr play basketball. Anticipated changes in roles/activities/participation: Pt plans to resume these activities as he is able. Pt/family expectations/goals: Pt wants to be able to care for himself at home and have his independence back.  Community Resources [employer]: Other (Comment) Premorbid Home Care/DME Agencies: None Transportation available at discharge: family Resource referrals recommended: Neuropsychology, Support group (specify)  Discharge Planning Living Arrangements: Spouse/significant other, Children Support Systems: Spouse/significant other, Children, Parent, Other relatives, Friends/neighbors, Church/faith community Type of Residence: Private residence Insurance Resources: Multimedia programmer (specify), Medicare(Blue Cross [employer]) Museum/gallery curator Resources: Family Support Financial Screen Referred: No Money Management: Patient, Spouse Does the patient have any problems obtaining your medications?: No Home Management: Pt and wife take care of the home.  Pt has friends/neighbors who plan to help him with yardwork. Patient/Family Preliminary Plans: Pt  plans to return to his home with his wife, children, parents to assist, as needed, although pt's goals are mainly mod I and pt wants to be independent. Social Work Anticipated Follow Up Needs: HH/OP,  Support Group Expected length of stay: 7 to 10 days  Clinical Impression CSW met with pt and his parents for a little while (with pt's permission) to introduce self and role of CSW, as well as to complete assessment.  Pt is very pleasant and motivated and a good advocate for himself and others.  Pt has great family support and feels good going home with only PRN assistance, as his wife works during the day and dtr is in school.  Son to be home from [facility] [employer] next week and parents are only 5-10 minutes away.  Pt is an athlete and is up to the challenge of reclaiming his independence.  Pt is on dialysis M, W, F at [facility] and he feels his family can pull together to transport him to and from.   CSW updated pt on team conference discussion and targeted d/c date of 06-19-17.  He is pleased with this and has worked hard with therapies the last two days.  Pt had a neighbor already install a ramp and someone can come to install grab bars, as needed.  He has access to a shower chair.  CSW will assist with other DME and f/u therapies closer to d/c.  CSW will continue to follow and assist as needed.  Esau Fridman, Silvestre Mesi 06/12/2017, 3:04 PM

## 2017-06-13 ENCOUNTER — Inpatient Hospital Stay (HOSPITAL_COMMUNITY): Payer: Medicare Other | Admitting: Physical Therapy

## 2017-06-13 ENCOUNTER — Inpatient Hospital Stay (HOSPITAL_COMMUNITY): Payer: BLUE CROSS/BLUE SHIELD | Admitting: Occupational Therapy

## 2017-06-13 DIAGNOSIS — R7309 Other abnormal glucose: Secondary | ICD-10-CM

## 2017-06-13 DIAGNOSIS — R0989 Other specified symptoms and signs involving the circulatory and respiratory systems: Secondary | ICD-10-CM

## 2017-06-13 LAB — CBC
HCT: 28.9 % — ABNORMAL LOW (ref 39.0–52.0)
HEMOGLOBIN: 9.3 g/dL — AB (ref 13.0–17.0)
MCH: 32.5 pg (ref 26.0–34.0)
MCHC: 32.2 g/dL (ref 30.0–36.0)
MCV: 101 fL — ABNORMAL HIGH (ref 78.0–100.0)
Platelets: 547 10*3/uL — ABNORMAL HIGH (ref 150–400)
RBC: 2.86 MIL/uL — ABNORMAL LOW (ref 4.22–5.81)
RDW: 15 % (ref 11.5–15.5)
WBC: 14.6 10*3/uL — ABNORMAL HIGH (ref 4.0–10.5)

## 2017-06-13 LAB — GLUCOSE, CAPILLARY
GLUCOSE-CAPILLARY: 125 mg/dL — AB (ref 65–99)
GLUCOSE-CAPILLARY: 384 mg/dL — AB (ref 65–99)
GLUCOSE-CAPILLARY: 49 mg/dL — AB (ref 65–99)
GLUCOSE-CAPILLARY: 96 mg/dL (ref 65–99)
Glucose-Capillary: 52 mg/dL — ABNORMAL LOW (ref 65–99)
Glucose-Capillary: 191 mg/dL — ABNORMAL HIGH (ref 65–99)
Glucose-Capillary: 342 mg/dL — ABNORMAL HIGH (ref 65–99)

## 2017-06-13 LAB — RENAL FUNCTION PANEL
ALBUMIN: 2.3 g/dL — AB (ref 3.5–5.0)
ANION GAP: 15 (ref 5–15)
BUN: 59 mg/dL — AB (ref 6–20)
CALCIUM: 7.9 mg/dL — AB (ref 8.9–10.3)
CO2: 26 mmol/L (ref 22–32)
Chloride: 92 mmol/L — ABNORMAL LOW (ref 101–111)
Creatinine, Ser: 13.02 mg/dL — ABNORMAL HIGH (ref 0.61–1.24)
GFR calc Af Amer: 5 mL/min — ABNORMAL LOW (ref >60)
GFR calc non Af Amer: 4 mL/min — ABNORMAL LOW (ref >60)
GLUCOSE: 194 mg/dL — AB (ref 65–99)
PHOSPHORUS: 4 mg/dL (ref 2.5–4.6)
Potassium: 4.6 mmol/L (ref 3.5–5.1)
SODIUM: 133 mmol/L — AB (ref 135–145)

## 2017-06-13 MED ORDER — HEPARIN SODIUM (PORCINE) 1000 UNIT/ML DIALYSIS: 5000.0000 [IU] | INTRAMUSCULAR | Status: DC | PRN

## 2017-06-13 MED ORDER — INSULIN ASPART 100 UNIT/ML ~~LOC~~ SOLN
0.0000 [IU] | Freq: Three times a day (TID) | SUBCUTANEOUS | Status: DC
  Administered 2017-06-13: 9 [IU] via SUBCUTANEOUS
  Administered 2017-06-14: 2 [IU] via SUBCUTANEOUS
  Administered 2017-06-14: 3 [IU] via SUBCUTANEOUS
  Administered 2017-06-15: 1 [IU] via SUBCUTANEOUS
  Administered 2017-06-16: 2 [IU] via SUBCUTANEOUS
  Administered 2017-06-16 – 2017-06-17 (×2): 1 [IU] via SUBCUTANEOUS
  Administered 2017-06-17 (×2): 2 [IU] via SUBCUTANEOUS
  Administered 2017-06-18: 1 [IU] via SUBCUTANEOUS

## 2017-06-13 NOTE — Progress Notes (Signed)
Occupational Therapy Session Note  Patient Details  Name: Lance [name] MRN: [id number] Date of Birth: 04/18/1970  Today's Date: 06/13/2017 OT Individual Time: 2330-0762 OT Individual Time Calculation (min): 55 min    Short Term Goals: STG = LTGs  Skilled Therapeutic Interventions/Progress Updates:    Treatment session with focus on functional mobility and RLE ROM.  Pt received supine in bed reporting fatigue and pain in RLE at surgical site, premedicated.  Pt completed bed mobility and squat pivot transfer to w/c with setup assist.  Pt propelled w/c 150' with supervision for BUE strengthening and endurance.  Engaged in squat pivot transfers with supervision and min cues for positioning of w/c.  Engaged in RLE ROM exercises  maintain ROM.  Engaged in bridging 2 sets of 10, hip flexion 3 sets of 10, hamstring stretch in supine x5, hip flexion/extension and abduction in sidelying 3 sets of 10 each. Ambulated 25' with RW x2 with supervision, min guard when turning to sit in w/c.  Pt returned to room as above and left upright in w/c with all needs in reach.   Therapy Documentation Precautions:  Precautions Precautions: Fall Precaution Comments: wound VAC Required Braces or Orthoses: Other Brace/Splint Other Brace/Splint: residual limb protector Restrictions Weight Bearing Restrictions: Yes RLE Weight Bearing: Non weight bearing Other Position/Activity Restrictions: R LE General:   Vital Signs: Therapy Vitals Temp: 98.7 F (37.1 C) Temp Source: Oral Pulse Rate: 98 Resp: 18 BP: (!) 151/74 Patient Position (if appropriate): Lying Oxygen Therapy SpO2: 98 % O2 Device: Room Air Pain: Pain Assessment Pain Scale: 0-10 Pain Score: 5 Pain Location: Leg Pain Orientation: Right Pain Radiating Towards: knee surgical Rt BKA Pain Descriptors / Indicators: Throbbing;Sharp;Aching;Discomfort  Pain Intervention(s): exercise;premedicated  See Function Navigator for Current  Functional Status.   Therapy/Group: Individual Therapy  Simonne Come 06/13/2017, 9:42 AM

## 2017-06-13 NOTE — Progress Notes (Signed)
[place] PHYSICAL MEDICINE & REHABILITATION     PROGRESS NOTE  Subjective/Complaints:  Patient seen lying in bed this morning. He states he slept well overnight. He states he had a better day of therapies yesterday. He notes improvement in pain. He would like to know what time his VAC is going to be DC'd  ROS: Denies CP, SOB, nausea, vomiting, diarrhea.  Objective: Vital Signs: Blood pressure (!) 151/74, pulse 98, temperature 98.7 F (37.1 C), temperature source Oral, resp. rate 18, height 6\' 3"  (1.905 m), weight 104.6 kg (230 lb 9.6 oz), SpO2 98 %. Dg Abd 1 View  Result Date: 06/11/2017 CLINICAL DATA:  Nausea, vomiting. EXAM: ABDOMEN - 1 VIEW COMPARISON:  Radiographs November 15, 2015. FINDINGS: The bowel gas pattern is normal. No radio-opaque calculi or other significant radiographic abnormality are seen. IMPRESSION: No evidence of bowel obstruction or ileus. Electronically Signed   By: Marijo Conception, M.D.   On: 06/11/2017 13:45   Recent Labs    06/11/17 1639  WBC 19.8*  HGB 10.0*  HCT 30.6*  PLT 592*   Recent Labs    06/11/17 1639  NA 133*  K 4.4  CL 90*  GLUCOSE 115*  BUN 61*  CREATININE 13.10*  CALCIUM 8.3*   CBG (last 3)  Recent Labs    06/12/17 1640 06/12/17 2120 06/13/17 0602  GLUCAP 74 236* 125*    Wt Readings from Last 3 Encounters:  06/12/17 104.6 kg (230 lb 9.6 oz)  06/09/17 106.2 kg (234 lb 2.1 oz)  05/29/17 108.9 kg (240 lb)    Physical Exam:  BP (!) 151/74 (BP Location: Left Arm)   Pulse 98   Temp 98.7 F (37.1 C) (Oral)   Resp 18   Ht 6\' 3"  (1.905 m)   Wt 104.6 kg (230 lb 9.6 oz)   SpO2 98%   BMI 28.82 kg/m  Constitutional: No distress . Vital signs reviewed. Well-developed. HENT: Normocephalic.  Atraumatic. Eyes: EOMI. No discharge. Cardiovascular: RRR. No JVD. Respiratory: CTA Bilaterally. Normal effort. GI: BS +. Non-distended. Musc: No edema. +tenderness in right BKA Neurological: He isalertand oriented  Motor: B/l UE 5/5  prox to distal.  LLE 5/5. RLE: HF 4+/5 (stable, pain inhibition) Skin: Right BKA with VAC Psychiatric: He has anormal mood and affect. Hisbehavior is normal  Assessment/Plan: 1. Functional deficits secondary to right BKA which require 3+ hours per day of interdisciplinary therapy in a comprehensive inpatient rehab setting. Physiatrist is providing close team supervision and 24 hour management of active medical problems listed below. Physiatrist and rehab team continue to assess barriers to discharge/monitor patient progress toward functional and medical goals.  Function:  Bathing Bathing position      Bathing parts Body parts bathed by patient: Right arm, Left arm, Chest, Abdomen    Bathing assist Assist Level: Supervision or verbal cues      Upper Body Dressing/Undressing Upper body dressing   What is the patient wearing?: Pull over shirt/dress     Pull over shirt/dress - Perfomed by patient: Thread/unthread right sleeve, Thread/unthread left sleeve, Put head through opening, Pull shirt over trunk          Upper body assist Assist Level: Set up      Lower Body Dressing/Undressing Lower body dressing Lower body dressing/undressing activity did not occur: Refused  Lower body assist Assist for lower body dressing: Touching or steadying assistance (Pt > 75%)      Toileting Toileting   Toileting steps completed by patient: Adjust clothing prior to toileting, Performs perineal hygiene, Adjust clothing after toileting      Toileting assist Assist level: Touching or steadying assistance (Pt.75%)(per OT note)   Transfers Chair/bed transfer   Chair/bed transfer method: Squat pivot Chair/bed transfer assist level: Touching or steadying assistance (Pt > 75%) Chair/bed transfer assistive device: Armrests     Locomotion Ambulation     Max distance: 10' Assist level: Touching or steadying assistance (Pt > 75%)    Wheelchair   Type: Manual Max wheelchair distance: 150 Assist Level: Supervision or verbal cues  Cognition Comprehension Comprehension assist level: Follows complex conversation/direction with no assist  Expression Expression assist level: Expresses complex ideas: With no assist  Social Interaction Social Interaction assist level: Interacts appropriately with others - No medications needed.  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   Continue CIR 2. DVT Prophylaxis/Anticoagulation: SCDs left lower extremity 3. Pain Management/Phantom limb pain:Robaxin and oxycodone as needed  Gabapentin changed to 100 mg daily and 300 mg after HD on 5/2 4. Mood:Provide emotional support 5. Neuropsych: This patientiscapable of making decisions on hisown behalf. 6. Skin/Wound Care:Routine skin checks  DC VAC today 7. Fluids/Electrolytes/Nutrition:Routine I/Os  8.End-stage renal disease. Continue hemodialysis as directed -HD MWF after therapy day to allow for participation in therapies 9.Acute on chronic anemia.   Hemoglobin 10.0 on 5/1   Continue to monitor 10.Hypertension. Coreg 3.125 mg twice daily, lisinopril 20 mg daily  Labile on 5/3 11.Diastolic congestive heart failure. Monitor for any signs of fluid overload Filed Weights   06/11/17 1415 06/11/17 1732 06/12/17 0543  Weight: 103.4 kg (227 lb 15.3 oz) 102.2 kg (225 lb 5 oz) 104.6 kg (230 lb 9.6 oz)   ?Trending up  Management per nephro 12,.Diabetes mellitus with peripheral neuropathy. Hemoglobin A1c 9.1.   Lantus insulin 35 units nightly,    NovoLog 3 units 3 times daily.   Check blood sugars before meals and at bedtime. Diabetic teaching  Remains labile on 5/3  Monitor with  increased mobility 13.Tobacco abuse. Counseling 14.Constipation. Laxative assistance 15. Leukocytosis  WBCs 19.8 on 5/1   Afebrile  Continue to monitor 16. Nausea/vomiting  improved  KUB reviewed, unremarkable  LOS (Days) 3 A FACE TO FACE EVALUATION WAS PERFORMED  Lance [name] 06/13/2017 8:20 AM

## 2017-06-13 NOTE — Progress Notes (Signed)
Physical Therapy Note  Patient Details  Name: Lance [name] MRN: [id number] Date of Birth: 1970/09/22 Today's Date: 06/13/2017    Time: [phone] 40 minutes  1:1 Pt c/o fatigue and residual limb pain, RN gives meds during session.  Pt propels w/c throughout unit with mod I for propulsion, cuing for parts management.  Transfers squat pivot and sit to stand with supervision.  Sit to stand 2 x 10 for LE strengthening with 1 UE support with supervision.  Standing balance task with 1 UE support for horseshoe toss with pt requiring occasional steadying assist.  Pt then states he feels nauseas.  Pt with episode of large amounts of vomit.  RN aware. Pt back to bed with supervision, left with needs at hand.  Pt missed 35 minutes of skilled PT   Hussam Muniz 06/13/2017, 10:19 AM

## 2017-06-14 ENCOUNTER — Inpatient Hospital Stay (HOSPITAL_COMMUNITY): Payer: Medicare Other | Admitting: Physical Therapy

## 2017-06-14 ENCOUNTER — Inpatient Hospital Stay (HOSPITAL_COMMUNITY): Payer: Medicare Other

## 2017-06-14 LAB — GLUCOSE, CAPILLARY
Glucose-Capillary: 93 mg/dL (ref 65–99)
Glucose-Capillary: 65 mg/dL (ref 65–99)
Glucose-Capillary: 188 mg/dL — ABNORMAL HIGH (ref 65–99)
Glucose-Capillary: 218 mg/dL — ABNORMAL HIGH (ref 65–99)

## 2017-06-14 MED ORDER — DARBEPOETIN ALFA 40 MCG/0.4ML IJ SOSY
40.0000 ug | PREFILLED_SYRINGE | INTRAMUSCULAR | Status: DC
  Administered 2017-06-16: 40 ug via INTRAVENOUS
  Filled 2017-06-14: qty 0.4

## 2017-06-14 MED ORDER — PRO-STAT SUGAR FREE PO LIQD
30.0000 mL | Freq: Two times a day (BID) | ORAL | Status: DC
  Administered 2017-06-14 – 2017-06-17 (×8): 30 mL via ORAL
  Filled 2017-06-14 (×8): qty 30

## 2017-06-14 MED ORDER — INSULIN GLARGINE 100 UNIT/ML ~~LOC~~ SOLN
25.0000 [IU] | Freq: Every day | SUBCUTANEOUS | Status: DC
  Administered 2017-06-14 – 2017-06-17 (×4): 25 [IU] via SUBCUTANEOUS
  Filled 2017-06-14 (×4): qty 0.25

## 2017-06-14 NOTE — Progress Notes (Signed)
Occupational Therapy Session Note  Patient Details  Name: Lance [name] MRN: [id number] Date of Birth: [birth year]  Today's Date: 06/14/2017 OT Individual Time: 7543-6067 and 7034-0352 OT Individual Time Calculation (min): 41 min and 30 min    Short Term Goals: Week 1:     Skilled Therapeutic Interventions/Progress Updates:    27 min 1;1. Pt supine in bed with wife and son present throughout session. Pt with no c/o pain. Pt agreeable to discussing home shower set up options and DME. Pt propels to tx gym with MOD I. OT demo shower stall transfer with posterior hopping and TTB transfer and potential modifications to "soaking tub" such as hand held shower head. Pt prefer TTB transfer and completes squat pivot transfer to TTB with CGA and VC for w/c parts management. Exited session with pt seated in bed with call light in reach and all needs met. Wife going to assist with wash up and pt verbalizes understanding to not stand unless staff is present.  41 min 1:1. Pt completes squat pivot transfer EOB>w/c. Pt propels w/c throughout unit with increased time. In dayroom, pt plays game of wii bowling at sit to stand level with min A for balance while stabilizing with  1UE on the walker. Pt completes 1x15 shoulder ab/adduciton, flex/ext, horizontal ab/adduct, int/ext rotation, elbow flex/ext, w/c push ups, and chest/shoulder press with 3 # weights to improve BUE strength/endurance for BADLs with min instructional  cues. Exited session with pt seated in w/c, call light in reach and all needs met.  Therapy Documentation Precautions:  Precautions Precautions: Fall Precaution Comments: wound VAC Required Braces or Orthoses: Other Brace/Splint Other Brace/Splint: residual limb protector Restrictions Weight Bearing Restrictions: Yes RLE Weight Bearing: Non weight bearing Other Position/Activity Restrictions: R LE  See Function Navigator for Current Functional Status.   Therapy/Group: Individual  Therapy  Tonny Branch 06/14/2017, 1:56 PM

## 2017-06-14 NOTE — Progress Notes (Addendum)
[facility] Progress Note   Subjective:  Seen in room, s/p rehab this morning. No CP/dyspnea. Still with leg pain, but improving and rehab going well.  Objective Vitals:   06/13/17 1815 06/13/17 1845 06/13/17 1924 06/14/17 0504  BP: (!) 107/56 125/62 124/71 (!) 103/58  Pulse: 96 (!) 101 (!) 102 97  Resp:  17 18 18   Temp:  98.3 F (36.8 C) 98.2 F (36.8 C) 99.3 F (37.4 C)  TempSrc:  Oral Oral Oral  SpO2:  99% 99% 96%  Weight:  102 kg (224 lb 13.9 oz)    Height:       Physical Exam General: Well appearing, NAD Heart: Slightly tachycardic (s/p therapy), no murmur Lungs: CTAB Extremities: No LLE. R BKA stump in large brace Dialysis Access: AVF + thrill  Additional Objective Labs: Basic Metabolic Panel: Recent Labs  Lab 06/09/17 0737 06/11/17 1639 06/13/17 1712  NA 136 133* 133*  K 3.2* 4.4 4.6  CL 97* 90* 92*  CO2 23 28 26   GLUCOSE 76 115* 194*  BUN 68* 61* 59*  CREATININE 13.86* 13.10* 13.02*  CALCIUM 8.1* 8.3* 7.9*  PHOS 3.8 4.2 4.0   Liver Function Tests: Recent Labs  Lab 06/09/17 0737 06/11/17 1639 06/13/17 1712  ALBUMIN 2.0* 2.1* 2.3*   CBC: Recent Labs  Lab 06/07/17 1142 06/09/17 0737 06/10/17 0307 06/11/17 1639 06/13/17 1712  WBC 22.9* 19.8* 20.8* 19.8* 14.6*  HGB 10.9* 10.9* 10.4* 10.0* 9.3*  HCT 32.2* 31.7* 31.8* 30.6* 28.9*  MCV 98.5 97.8 100.3* 101.7* 101.0*  PLT 561* 640* 543* 592* 547*   Blood Culture    Component Value Date/Time   SDES FLUID LEFT PLEURAL 11/30/2015 1426   SDES FLUID LEFT PLEURAL 11/30/2015 1426   SPECREQUEST BOTTLES DRAWN AEROBIC AND ANAEROBIC 10CC 11/30/2015 1426   SPECREQUEST NONE 11/30/2015 1426   CULT NO GROWTH 5 DAYS 11/30/2015 1426   REPTSTATUS 12/06/2015 FINAL 11/30/2015 1426   REPTSTATUS 11/30/2015 FINAL 11/30/2015 1426   CBG: Recent Labs  Lab 06/13/17 1316 06/13/17 1940 06/13/17 2102 06/13/17 2333 06/14/17 0624  GLUCAP 96 191* 342* 384* 93   Medications: . sodium chloride     . sodium chloride    . methocarbamol (ROBAXIN)  IV     . aspirin EC  325 mg Oral Daily  . calcitRIOL  0.25 mcg Oral Q M,W,F-HD  . calcium acetate  667 mg Oral TID WC  . carvedilol  3.125 mg Oral BID WC  . docusate sodium  100 mg Oral BID  . gabapentin  100 mg Oral Daily  . gabapentin  300 mg Oral Q M,W,F-HD  . hydrocerin   Topical BID  . insulin aspart  0-9 Units Subcutaneous TID WC & HS  . insulin aspart  3 Units Subcutaneous TID WC  . insulin glargine  35 Units Subcutaneous QHS  . pantoprazole  40 mg Oral Daily  . sucroferric oxyhydroxide  1,500 mg Oral TID WC    Dialysis Orders: MWF at [facility] 4h  500/800  105.5kg  2k/3calc bath  p4  R avf   Hep 10000 -parsabiv 5 mg tiw iv -calcitriol 0.25 ug tiw po  Assessment/Plan: 1. 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 2. ESRD: Continue HD per MWF schedule, next 5/6. 3. HTN/volume: BP low side, ACE-I d/c'd. On low dose Coreg only. EDW lowered to 102kg. 4. Anemia: Hgb 9.3, starting Aranesp 49mcg weekly with next HD. 5. Secondary hyperparathyroidism:  Good labs, continue Velphoro/Phoslo as binder.  6. Nutrition: Alb low, adding Pro-stat. 7. Type 1 DM: On insulin, per primary.  Veneta Penton, PA-C 06/14/2017, 10:17 AM  [facility] Pager: [phone]  Pt seen, examined and agree w A/P as above.  [name] MD [employer] pager [phone]   06/14/2017, 12:51 PM

## 2017-06-14 NOTE — Progress Notes (Signed)
[place] PHYSICAL MEDICINE & REHABILITATION     PROGRESS NOTE  Subjective/Complaints:  Patient seen lying in bed this morning.  He states his CBGs were labile yesterday with symptomatic hypoglycemia.  ROS: Denies CP, SOB, nausea, vomiting, diarrhea.  Objective: Vital Signs: Blood pressure (!) 103/58, pulse 97, temperature 99.3 F (37.4 C), temperature source Oral, resp. rate 18, height 6\' 3"  (1.905 m), weight 102 kg (224 lb 13.9 oz), SpO2 96 %. No results found. Recent Labs    06/11/17 1639 06/13/17 1712  WBC 19.8* 14.6*  HGB 10.0* 9.3*  HCT 30.6* 28.9*  PLT 592* 547*   Recent Labs    06/11/17 1639 06/13/17 1712  NA 133* 133*  K 4.4 4.6  CL 90* 92*  GLUCOSE 115* 194*  BUN 61* 59*  CREATININE 13.10* 13.02*  CALCIUM 8.3* 7.9*   CBG (last 3)  Recent Labs    06/13/17 2333 06/14/17 0624 06/14/17 1146  GLUCAP 384* 93 65    Wt Readings from Last 3 Encounters:  06/13/17 102 kg (224 lb 13.9 oz)  06/09/17 106.2 kg (234 lb 2.1 oz)  05/29/17 108.9 kg (240 lb)    Physical Exam:  BP (!) 103/58 (BP Location: Left Arm)   Pulse 97   Temp 99.3 F (37.4 C) (Oral)   Resp 18   Ht 6\' 3"  (1.905 m)   Wt 102 kg (224 lb 13.9 oz)   SpO2 96%   BMI 28.11 kg/m  Constitutional: No distress . Vital signs reviewed. Well-developed. HENT: Normocephalic.  Atraumatic. Eyes: EOMI. No discharge. Cardiovascular: RRR. No JVD. Respiratory: CTA Bilaterally.  Normal effort. GI: BS +. Non-distended. Musc: No edema. +tenderness in right BKA (stable) Neurological: He isalertand oriented  Motor: B/l UE 5/5 prox to distal.  LLE 5/5. RLE: HF 4+/5 (unchanged, pain inhibition) Skin: Right BKA with sanguinous drainage along central and central lateral incision Psychiatric: He has anormal mood and affect. Hisbehavior is normal  Assessment/Plan: 1. Functional deficits secondary to right BKA which require 3+ hours per day of interdisciplinary therapy in a comprehensive inpatient rehab  setting. Physiatrist is providing close team supervision and 24 hour management of active medical problems listed below. Physiatrist and rehab team continue to assess barriers to discharge/monitor patient progress toward functional and medical goals.  Function:  Bathing Bathing position      Bathing parts Body parts bathed by patient: Right arm, Left arm, Chest, Abdomen    Bathing assist Assist Level: Supervision or verbal cues      Upper Body Dressing/Undressing Upper body dressing   What is the patient wearing?: Pull over shirt/dress     Pull over shirt/dress - Perfomed by patient: Thread/unthread right sleeve, Thread/unthread left sleeve, Put head through opening, Pull shirt over trunk          Upper body assist Assist Level: Set up      Lower Body Dressing/Undressing Lower body dressing Lower body dressing/undressing activity did not occur: Refused                                Lower body assist Assist for lower body dressing: Touching or steadying assistance (Pt > 75%)      Toileting Toileting   Toileting steps completed by patient: Performs perineal hygiene, Adjust clothing prior to toileting Toileting steps completed by helper: Adjust clothing after toileting Toileting Assistive Devices: Grab bar or rail  Toileting assist Assist level: Touching or steadying assistance (  Pt.75%)   Transfers Chair/bed transfer   Chair/bed transfer method: Squat pivot Chair/bed transfer assist level: Touching or steadying assistance (Pt > 75%) Chair/bed transfer assistive device: Armrests     Locomotion Ambulation     Max distance: 10' Assist level: Touching or steadying assistance (Pt > 75%)   Wheelchair   Type: Manual Max wheelchair distance: 150 Assist Level: Supervision or verbal cues  Cognition Comprehension Comprehension assist level: Follows complex conversation/direction with no assist  Expression Expression assist level: Expresses complex ideas:  With extra time/assistive device  Social Interaction Social Interaction assist level: Interacts appropriately with others - No medications needed.  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   Continue CIR 2. DVT Prophylaxis/Anticoagulation: SCDs left lower extremity 3. Pain Management/Phantom limb pain:Robaxin and oxycodone as needed  Gabapentin changed to 100 mg daily and 300 mg after HD on 5/2  Encouraged desensitization techniques 4. Mood:Provide emotional support 5. Neuropsych: This patientiscapable of making decisions on hisown behalf. 6. Skin/Wound Care:Routine skin checks  DCed VAC  7. Fluids/Electrolytes/Nutrition:Routine I/Os  8.End-stage renal disease. Continue hemodialysis as directed -HD MWF after therapy day to allow for participation in therapies 9.Acute on chronic anemia.   Hemoglobin 9.3 on 5/3   Continue to monitor 10.Hypertension. Coreg 3.125 mg twice daily, lisinopril 20 mg daily  Relatively controlled on 5/4 11.Diastolic congestive heart failure. Monitor for any signs of fluid overload Filed Weights   06/12/17 0543 06/13/17 1445 06/13/17 1845  Weight: 104.6 kg (230 lb 9.6 oz) 104.3 kg (229 lb 15 oz) 102 kg (224 lb 13.9 oz)   Stable on 5/4  Management per nephro 12,.Diabetes mellitus with peripheral neuropathy. Hemoglobin A1c 9.1.   Lantus insulin 35 units nightly, decreased to 25 units on 5/4,    NovoLog 3 units 3 times daily.   Check blood sugars before meals and at bedtime. Diabetic teaching  Extremely labile with hypoglycemia and elevations greater than 350 on 5/4  Monitor with increased mobility 13.Tobacco abuse. Counseling 14.Constipation. Laxative assistance 15. Leukocytosis  WBCs 14.6 on  5/3, improving   Afebrile  Continue to monitor 16. Nausea/vomiting  improved  KUB reviewed, unremarkable  LOS (Days) 4 A FACE TO FACE EVALUATION WAS PERFORMED  Francisca Harbuck Lorie Phenix 06/14/2017 1:46 PM

## 2017-06-14 NOTE — Progress Notes (Signed)
Physical Therapy Session Note  Patient Details  Name: Lance [name] MRN: [id number] Date of Birth: [birth year]  Today's Date: 06/14/2017 PT Individual Time: 1020-1100 AND 1505-1605 PT Individual Time Calculation (min): 40 min  And 60 min   Short Term Goals: Week 1:  PT Short Term Goal 1 (Week 1): STG=LTG due to ELOS   Skilled Therapeutic Interventions/Progress Updates:  Session 1 Pt received supine in bed and agreeable to PT. Supine>sit transfer without assist or cues   WC mobility instructed by PT with distant supervision assist from PT   Gait training with RW x 4f with supervision assist. Min cues for gait pattern and AD management in turns. Stair management trainning x 4 on 4" step and x 4 on 6" step in parallel bars. 6" stepX 2 on stair case with min assist for safety. Moderate cues for proper use of UE and technique for posterior Ascent.   [facility] returned to [facility] and [facility] reach and all needs met.      Session 2.   Pt received supine in bed and agreeable to PT. Supine>sit transfer without assist or cues.   Squat [facility] supervision assist from PT.   WC mobility to rehab gym with distant supervision assist from PT 2 x 1538f PT required to provide moderate cues for [facility]art management to prepare for transfer to mate table. Squat pivot to the R with supervision assist from PT. Sit<>supine without cues or assist from PT .   PT instructed pt in Supine, sidelying and prone therex  Prone: HS curls and hip extension x 12 BLE  Sidelying: hip extension and abduction x 12 BLE  Supine: SAQx 12, SLR x 12, bridge through bolster x 8 with 3 sec hold, reciprocal marches x 15 BLE Cues from PT for proper speed of movement with emphasis on decreased eccentric movement speed to improve strengthening aspects  Of movement.   Gait training with RW [id number] 2 with sueprvision assist from PT. Min cues for posture and increased step  length as tolerated to reduce overall energy expenditure.   Patient returned to room and left sitting in [facility] call [facility] in reach and all needs met.              Therapy Documentation Precautions:  Precautions Precautions: Fall Precaution Comments: wound VAC Required Braces or Orthoses: Other Brace/Splint Other Brace/Splint: residual limb protector Restrictions Weight Bearing Restrictions: Yes RLE Weight Bearing: Non weight bearing Other Position/Activity Restrictions: R LE Vital Signs: Therapy Vitals Temp: 98.4 F (36.9 C) Temp Source: Oral Pulse Rate: 99 Resp: 16 BP: 113/68 Patient Position (if appropriate): Sitting Oxygen Therapy SpO2: 100 % O2 Device: Room Air Pain: Denies   See Function Navigator for Current Functional Status.   Therapy/Group: Individual Therapy  AusLorie Phenix4/2019, 5:28 PM

## 2017-06-14 NOTE — Progress Notes (Signed)
Physical Therapy Session Note  Patient Details  Name: Lance [name] MRN: [id number] Date of Birth: 10/17/1970  Today's Date: 06/14/2017 PT Individual Time: 1100-1145   32mn  Short Term Goals: Week 1:  PT Short Term Goal 1 (Week 1): STG=LTG due to ELOS   Skilled Therapeutic Interventions/Progress Updates:   Per RN pt, has had significant N/V prior to therapy. Pt received supine in bed and agreeable to PT. Pt reports that his stomach is still very uneasy and that he has been cramping all morning in all 4 extremities and abs. PT instructed pt in Supine and sidelying therex. SLR, SAQ, heel slides, hip abduction, bridge through thighs, sidelying hip extension, ankle pumps. All completed 15x 2 BLE when possible. Pt noted to fall asleep between exercises and intermittently fall asleep during exercises. Pt left asleep in bed with call bell in reach and all needs met.      Therapy Documentation Precautions:  Precautions Precautions: Fall Precaution Comments: wound VAC Required Braces or Orthoses: Other Brace/Splint Other Brace/Splint: residual limb protector Restrictions Weight Bearing Restrictions: Yes RLE Weight Bearing: Non weight bearing Other Position/Activity Restrictions: R LE General:  missed time: 15/min n/v  Vital Signs: Therapy Vitals Temp: 99.3 F (37.4 C) Temp Source: Oral Pulse Rate: 97 Resp: 18 BP: (!) 103/58 Patient Position (if appropriate): Lying Oxygen Therapy SpO2: 96 % O2 Device: Room Air Pain: Pain Assessment Pain Score: 0-No pain   See Function Navigator for Current Functional Status.   Therapy/Group: Individual Therapy  ALorie Phenix5/05/2017, 6:04 AM

## 2017-06-14 NOTE — Progress Notes (Signed)
Occupational Therapy Session Note  Patient Details  Name: Lance [name] MRN: [id number] Date of Birth: 09-06-1970  Today's Date: 06/14/2017 OT Individual Time: 8527-7824 OT Individual Time Calculation (min): 56 min    Skilled Therapeutic Interventions/Progress Updates:    1:1. Pt agreeable to tx, reporting no pain and feeling better than yesterday. Pt completes squat pivot transfers throughout session with supervision and VC for w/c parts management. Pt grooms at sink with set up to wash face and brush teeth. Pt with significant amount of blood in toothpaste but pt reports this as normal. Pt propels w/c with MOD I throughout unit. Pt completes 2x10 bean bag toss in standing with CGA-MIN A for standing balance with RW for reaching laterally/crossing midline to obtain bean bags. Pt completes 3x30 ball tosses (chest, bounce and overhead pass) with 3# wrist weights for BUE strengthening and endurance required for BADLs/functional transfers. Exited session with pt seated in w/c, call light in reach and all needs met  Therapy Documentation Precautions:  Precautions Precautions: Fall Precaution Comments: wound VAC Required Braces or Orthoses: Other Brace/Splint Other Brace/Splint: residual limb protector Restrictions Weight Bearing Restrictions: Yes RLE Weight Bearing: Non weight bearing Other Position/Activity Restrictions: R LE General:    See Function Navigator for Current Functional Status.   Therapy/Group: Individual Therapy  Tonny Branch 06/14/2017, 9:11 AM

## 2017-06-15 ENCOUNTER — Inpatient Hospital Stay (HOSPITAL_COMMUNITY): Payer: Medicare Other

## 2017-06-15 ENCOUNTER — Inpatient Hospital Stay (HOSPITAL_COMMUNITY): Payer: BLUE CROSS/BLUE SHIELD | Admitting: Physical Therapy

## 2017-06-15 LAB — GLUCOSE, CAPILLARY
GLUCOSE-CAPILLARY: 128 mg/dL — AB (ref 65–99)
GLUCOSE-CAPILLARY: 85 mg/dL (ref 65–99)
GLUCOSE-CAPILLARY: 118 mg/dL — AB (ref 65–99)
Glucose-Capillary: 104 mg/dL — ABNORMAL HIGH (ref 65–99)

## 2017-06-15 NOTE — Progress Notes (Addendum)
  [facility] Progress Note   Subjective:  Seen in room. No issues overnight, no dyspnea.  Objective Vitals:   06/13/17 1924 06/14/17 0504 06/14/17 1659 06/15/17 0536  BP: 124/71 (!) 103/58 113/68 126/69  Pulse: (!) 102 97 99 91  Resp: 18 18 16 16   Temp: 98.2 F (36.8 C) 99.3 F (37.4 C) 98.4 F (36.9 C) 99.3 F (37.4 C)  TempSrc: Oral Oral Oral Oral  SpO2: 99% 96% 100% 96%  Weight:      Height:       Physical Exam General: Well appearing, NAD Heart: RRR; no murmur Lungs: CTAB Extremities: No LLE. R BKA stump in large brace Dialysis Access: AVF + thrill  Additional Objective Labs: Basic Metabolic Panel: Recent Labs  Lab 06/09/17 0737 06/11/17 1639 06/13/17 1712  NA 136 133* 133*  K 3.2* 4.4 4.6  CL 97* 90* 92*  CO2 23 28 26   GLUCOSE 76 115* 194*  BUN 68* 61* 59*  CREATININE 13.86* 13.10* 13.02*  CALCIUM 8.1* 8.3* 7.9*  PHOS 3.8 4.2 4.0   Liver Function Tests: Recent Labs  Lab 06/09/17 0737 06/11/17 1639 06/13/17 1712  ALBUMIN 2.0* 2.1* 2.3*   CBC: Recent Labs  Lab 06/09/17 0737 06/10/17 0307 06/11/17 1639 06/13/17 1712  WBC 19.8* 20.8* 19.8* 14.6*  HGB 10.9* 10.4* 10.0* 9.3*  HCT 31.7* 31.8* 30.6* 28.9*  MCV 97.8 100.3* 101.7* 101.0*  PLT 640* 543* 592* 547*   CBG: Recent Labs  Lab 06/14/17 0624 06/14/17 1146 06/14/17 1649 06/14/17 2110 06/15/17 0610  GLUCAP 93 65 188* 218* 128*   Medications: . sodium chloride    . sodium chloride    . methocarbamol (ROBAXIN)  IV     . aspirin EC  325 mg Oral Daily  . calcitRIOL  0.25 mcg Oral Q M,W,F-HD  . calcium acetate  667 mg Oral TID WC  . carvedilol  3.125 mg Oral BID WC  . [START ON 06/16/2017] darbepoetin (ARANESP) injection - DIALYSIS  40 mcg Intravenous Q Mon-HD  . docusate sodium  100 mg Oral BID  . feeding supplement (PRO-STAT SUGAR FREE 64)  30 mL Oral BID  . gabapentin  100 mg Oral Daily  . gabapentin  300 mg Oral Q M,W,F-HD  . hydrocerin   Topical BID  . insulin  aspart  0-9 Units Subcutaneous TID WC & HS  . insulin aspart  3 Units Subcutaneous TID WC  . insulin glargine  25 Units Subcutaneous QHS  . pantoprazole  40 mg Oral Daily  . sucroferric oxyhydroxide  1,500 mg Oral TID WC    Dialysis Orders: MWF at [facility] 4h 500/800 105.5kg 2k/3calc bath p4 R avf Hep 10000 -parsabiv 5 mg tiw iv -calcitriol 0.25 ug tiw po  Assessment/Plan: 1. 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 2. ESRD: Continue HD per MWF schedule, next 5/6. 3. HTN/volume: BP controlled, ACE-I d/c'd. On low dose Coreg only. EDW lowered to 102kg. 4. Anemia: Hgb 9.3, starting Aranesp 78mcg weekly with next HD. 5. Secondary hyperparathyroidism:  Good labs, continue Velphoro/Phoslo as binder. 6. Nutrition: Alb low, but improving, continue Pro-stat. 7. Type 1 DM: On insulin, per primary.    Veneta Penton, PA-C 06/15/2017, 9:37 AM  [facility] Pager: [phone]  Pt seen, examined and agree w A/P as above.  [name] MD [employer] pager [phone]   06/15/2017, 2:29 PM

## 2017-06-15 NOTE — Progress Notes (Signed)
Occupational Therapy Session Note  Patient Details  Name: Lance [name] MRN: [id number] Date of Birth: 04-24-1970  Today's Date: 06/15/2017 OT Individual Time: 0973-5329 OT Individual Time Calculation (min): 41 min    Short Term Goals: Week 1:     Skilled Therapeutic Interventions/Progress Updates:    1;1. Pt greeted supine ready to go with no c/o pain. Pt supine>sit>w/c with set up. Pt dons shoe with supervision and Vc to elevate leg onto standard chair to tie. Pt propels w/c throuhgout session with MOD I. Pt completes recliner transfer from ambualtory level with CGA. OT discusses kitchen mobility, energy conservation and w/c positioning while reaching and completing kitchen search activity. Ot educates on hemi technique of w/c propulsion to use of pt needs to hold items in one hand to transport. Pt stands to play checkers at high low table with 1UE support for balance with supervision. Exited session with pt seated in w/c and all needs in reach  Therapy Documentation Precautions:  Precautions Precautions: Fall Precaution Comments: wound VAC Required Braces or Orthoses: Other Brace/Splint Other Brace/Splint: residual limb protector Restrictions Weight Bearing Restrictions: Yes RLE Weight Bearing: Non weight bearing Other Position/Activity Restrictions: R LE General:   Vital Signs: Therapy Vitals Temp: 98.9 F (37.2 C) Temp Source: Oral Pulse Rate: 95 Resp: 16 BP: 126/64 Patient Position (if appropriate): Lying Oxygen Therapy SpO2: 98 % O2 Device: Room Air  See Function Navigator for Current Functional Status.   Therapy/Group: Individual Therapy  Tonny Branch 06/15/2017, 4:53 PM

## 2017-06-15 NOTE — Progress Notes (Signed)
Physical Therapy Session Note  Patient Details  Name: Lance [name] MRN: [id number] Date of Birth: [birth year]  Today's Date: 06/15/2017 PT Individual Time: 1100-1159 PT Individual Time Calculation (min): 59 min   Short Term Goals: Week 1:  PT Short Term Goal 1 (Week 1): STG=LTG due to ELOS   Skilled Therapeutic Interventions/Progress Updates:  Pt performed all bed mobility and mat mobility with no assist and increased time. Pt performed all squat pivot transfers with S and verbal cues. Pt propelled w/c 150 feet x 2 with B UEs and S. In gym treatment focused on LE strengthening. Pt performed LE exercises 3 sets x 10 reps each: supine: heel slides, SAQs, bridging, hip abd/add, prone: hip ext, knee flex: side lying: hip abd, hip flex. Pt returned to room. Pt left sitting up in bed with bed alarm on and call bell within reach.   Therapy Documentation Precautions:  Precautions Precautions: Fall Precaution Comments: wound VAC Required Braces or Orthoses: Other Brace/Splint Other Brace/Splint: residual limb protector Restrictions Weight Bearing Restrictions: Yes RLE Weight Bearing: Non weight bearing Other Position/Activity Restrictions: R LE General:   Vital Signs:   Pain: No c/o pain.   See Function Navigator for Current Functional Status.   Therapy/Group: Individual Therapy  Dub Amis 06/15/2017, 12:02 PM

## 2017-06-15 NOTE — Progress Notes (Signed)
[place] PHYSICAL MEDICINE & REHABILITATION     PROGRESS NOTE  Subjective/Complaints:  Patient seen lying in bed this morning. He states he slept well overnight. He denies complaints this morning.  ROS: denies CP, SOB, nausea, vomiting, diarrhea.  Objective: Vital Signs: Blood pressure 126/64, pulse 95, temperature 98.9 F (37.2 C), temperature source Oral, resp. rate 16, height 6\' 3"  (1.905 m), weight 102 kg (224 lb 13.9 oz), SpO2 98 %. No results found. Recent Labs    06/13/17 1712  WBC 14.6*  HGB 9.3*  HCT 28.9*  PLT 547*   Recent Labs    06/13/17 1712  NA 133*  K 4.6  CL 92*  GLUCOSE 194*  BUN 59*  CREATININE 13.02*  CALCIUM 7.9*   CBG (last 3)  Recent Labs    06/15/17 0610 06/15/17 1158 06/15/17 1649  GLUCAP 128* 85 104*    Wt Readings from Last 3 Encounters:  06/13/17 102 kg (224 lb 13.9 oz)  06/09/17 106.2 kg (234 lb 2.1 oz)  05/29/17 108.9 kg (240 lb)    Physical Exam:  BP 126/64 (BP Location: Left Arm)   Pulse 95   Temp 98.9 F (37.2 C) (Oral)   Resp 16   Ht 6\' 3"  (1.905 m)   Wt 102 kg (224 lb 13.9 oz)   SpO2 98%   BMI 28.11 kg/m  Constitutional: No distress . Vital signs reviewed. Well-developed. HENT: Normocephalic.  Atraumatic. Eyes: EOMI. No discharge. Cardiovascular: RRR. No JVD. Respiratory: CTA Bilaterally.  Normal effort. GI: BS +. Non-distended. Musc: No edema. +tenderness in right BKA (unchanged) Neurological: He isalertand oriented  Motor: B/l UE 5/5 prox to distal.  LLE 5/5. RLE: HF 4+/5 (stable, pain inhibition) Skin: Right BKA with dressing C/D/I Psychiatric: He has anormal mood and affect. Hisbehavior is normal  Assessment/Plan: 1. Functional deficits secondary to right BKA which require 3+ hours per day of interdisciplinary therapy in a comprehensive inpatient rehab setting. Physiatrist is providing close team supervision and 24 hour management of active medical problems listed below. Physiatrist and rehab  team continue to assess barriers to discharge/monitor patient progress toward functional and medical goals.  Function:  Bathing Bathing position      Bathing parts Body parts bathed by patient: Right arm, Left arm, Chest, Abdomen    Bathing assist Assist Level: Supervision or verbal cues      Upper Body Dressing/Undressing Upper body dressing   What is the patient wearing?: Pull over shirt/dress     Pull over shirt/dress - Perfomed by patient: Thread/unthread right sleeve, Thread/unthread left sleeve, Put head through opening, Pull shirt over trunk          Upper body assist Assist Level: Set up      Lower Body Dressing/Undressing Lower body dressing Lower body dressing/undressing activity did not occur: Refused                                Lower body assist Assist for lower body dressing: Touching or steadying assistance (Pt > 75%)      Toileting Toileting   Toileting steps completed by patient: Performs perineal hygiene, Adjust clothing prior to toileting Toileting steps completed by helper: Adjust clothing after toileting Toileting Assistive Devices: Grab bar or rail  Toileting assist Assist level: Touching or steadying assistance (Pt.75%)   Transfers Chair/bed transfer   Chair/bed transfer method: Squat pivot Chair/bed transfer assist level: Supervision or verbal cues Chair/bed transfer  assistive device: Armrests, Bedrails     Locomotion Ambulation     Max distance: 16ft  Assist level: Supervision or verbal cues   Wheelchair   Type: Manual Max wheelchair distance: 150 Assist Level: Supervision or verbal cues  Cognition Comprehension Comprehension assist level: Follows complex conversation/direction with no assist  Expression Expression assist level: Expresses complex ideas: With extra time/assistive device  Social Interaction Social Interaction assist level: Interacts appropriately with others with medication or extra time (anti-anxiety,  antidepressant).  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   Continue CIR 2. DVT Prophylaxis/Anticoagulation: SCDs left lower extremity 3. Pain Management/Phantom limb pain:Robaxin and oxycodone as needed  Gabapentin changed to 100 mg daily and 300 mg after HD on 5/2  Encouraged desensitization techniques 4. Mood:Provide emotional support 5. Neuropsych: This patientiscapable of making decisions on hisown behalf. 6. Skin/Wound Care:Routine skin checks  DCed VAC  7. Fluids/Electrolytes/Nutrition:Routine I/Os  8.End-stage renal disease. Continue hemodialysis as directed -HD MWF after therapy day to allow for participation in therapies 9.Acute on chronic anemia.   Hemoglobin 9.3 on 5/3   Continue to monitor 10.Hypertension. Coreg 3.125 mg twice daily, lisinopril 20 mg daily  Controlled on 5/5 11.Diastolic congestive heart failure. Monitor for any signs of fluid overload Filed Weights   06/12/17 0543 06/13/17 1445 06/13/17 1845  Weight: 104.6 kg (230 lb 9.6 oz) 104.3 kg (229 lb 15 oz) 102 kg (224 lb 13.9 oz)   Stable on 5/5  Management per nephro 12,.Diabetes mellitus with peripheral neuropathy. Hemoglobin A1c 9.1.   Lantus insulin 35 units nightly, decreased to 25 units on 5/4    NovoLog 3 units 3 times daily.   Check blood sugars before meals and at bedtime. Diabetic teaching  Labile and 5/5  Monitor with increased mobility 13.Tobacco abuse. Counseling 14.Constipation. Laxative assistance 15. Leukocytosis  WBCs 14.6 on 5/3, improving   Afebrile  Continue to monitor 16. Nausea/vomiting  improved  KUB reviewed, unremarkable  LOS (Days) 5 A FACE TO FACE EVALUATION WAS PERFORMED  Helena Sardo Lorie Phenix 06/15/2017 5:44 PM

## 2017-06-16 ENCOUNTER — Inpatient Hospital Stay (HOSPITAL_COMMUNITY): Payer: BLUE CROSS/BLUE SHIELD | Admitting: Occupational Therapy

## 2017-06-16 ENCOUNTER — Inpatient Hospital Stay (HOSPITAL_COMMUNITY): Payer: Medicare Other

## 2017-06-16 ENCOUNTER — Inpatient Hospital Stay (HOSPITAL_COMMUNITY): Payer: Medicare Other | Admitting: Physical Therapy

## 2017-06-16 DIAGNOSIS — I5032 Chronic diastolic (congestive) heart failure: Secondary | ICD-10-CM

## 2017-06-16 DIAGNOSIS — I10 Essential (primary) hypertension: Secondary | ICD-10-CM

## 2017-06-16 LAB — RENAL FUNCTION PANEL
ALBUMIN: 2.2 g/dL — AB (ref 3.5–5.0)
ANION GAP: 16 — AB (ref 5–15)
BUN: 73 mg/dL — AB (ref 6–20)
CO2: 28 mmol/L (ref 22–32)
Calcium: 8.3 mg/dL — ABNORMAL LOW (ref 8.9–10.3)
Chloride: 90 mmol/L — ABNORMAL LOW (ref 101–111)
Creatinine, Ser: 14.38 mg/dL — ABNORMAL HIGH (ref 0.61–1.24)
GFR calc Af Amer: 4 mL/min — ABNORMAL LOW (ref >60)
GFR calc non Af Amer: 3 mL/min — ABNORMAL LOW (ref >60)
GLUCOSE: 99 mg/dL (ref 65–99)
PHOSPHORUS: 4 mg/dL (ref 2.5–4.6)
POTASSIUM: 4.9 mmol/L (ref 3.5–5.1)
Sodium: 134 mmol/L — ABNORMAL LOW (ref 135–145)

## 2017-06-16 LAB — GLUCOSE, CAPILLARY
GLUCOSE-CAPILLARY: 68 mg/dL (ref 65–99)
GLUCOSE-CAPILLARY: 155 mg/dL — AB (ref 65–99)
Glucose-Capillary: 115 mg/dL — ABNORMAL HIGH (ref 65–99)
Glucose-Capillary: 102 mg/dL — ABNORMAL HIGH (ref 65–99)
Glucose-Capillary: 181 mg/dL — ABNORMAL HIGH (ref 65–99)

## 2017-06-16 LAB — CBC
HEMATOCRIT: 27.3 % — AB (ref 39.0–52.0)
Hemoglobin: 8.7 g/dL — ABNORMAL LOW (ref 13.0–17.0)
MCH: 32.3 pg (ref 26.0–34.0)
MCHC: 31.9 g/dL (ref 30.0–36.0)
MCV: 101.5 fL — AB (ref 78.0–100.0)
Platelets: 471 10*3/uL — ABNORMAL HIGH (ref 150–400)
RBC: 2.69 MIL/uL — ABNORMAL LOW (ref 4.22–5.81)
RDW: 15 % (ref 11.5–15.5)
WBC: 16.2 10*3/uL — ABNORMAL HIGH (ref 4.0–10.5)

## 2017-06-16 MED ORDER — DARBEPOETIN ALFA 40 MCG/0.4ML IJ SOSY
PREFILLED_SYRINGE | INTRAMUSCULAR | Status: AC
  Filled 2017-06-16: qty 0.4

## 2017-06-16 NOTE — Progress Notes (Signed)
Occupational Therapy Session Note  Patient Details  Name: Lance [name] MRN: [id number] Date of Birth: Jun 07, 1970  Today's Date: 06/16/2017 OT Individual Time: 4401-0272 OT Individual Time Calculation (min): 55 min    Short Term Goals: STG = LTGs   Skilled Therapeutic Interventions/Progress Updates:    Treatment session with focus on functional mobility, dynamic standing balance, and BUE strengthening.  Pt received seated EOB having just finished PT session.  Pt declined bathing/dressing reporting that his mom assists him prior to HD.  Discussed recommendation to complete bathing at shower level with therapy prior to d/c home with pt agreeable to complete tomorrow.  Pt propelled w/c to therapy gym without assist.  Engaged in dynamic standing balance activity alternating UE support on table while reaching across midline and outside BOS to challenge balance reactions and endurance.  Educated on [place] with level 2 theraband with chest presses, overhead tricep pulls, and PNF pattern diagonals 3 sets of 10 each.  Ambulated x3 navigating around cones to simulate ambulating around furniture in home setup with pt completing supervision with RW until 3rd attempt with min guard as he fatigued.  Pt returned to room as above and left upright in w/c for PT.  Therapy Documentation Precautions:  Precautions Precautions: Fall Precaution Comments: wound VAC Required Braces or Orthoses: Other Brace/Splint Other Brace/Splint: residual limb protector Restrictions Weight Bearing Restrictions: Yes RLE Weight Bearing: Non weight bearing Other Position/Activity Restrictions: R LE Pain: Pain Assessment Pain Scale: 0-10 Pain Score: 2  Faces Pain Scale: Hurts a little bit Pain Type: Acute pain Pain Location: Back Pain Orientation: Posterior Pain Radiating Towards: Lleg Pain Descriptors / Indicators: Aching Pain Frequency: Intermittent Pain Onset: Sudden Pain Intervention(s):  Repositioned  See Function Navigator for Current Functional Status.   Therapy/Group: Individual Therapy  Simonne Come 06/16/2017, 9:58 AM

## 2017-06-16 NOTE — Plan of Care (Signed)
  Problem: Consults Goal: RH LIMB LOSS PATIENT EDUCATION Description Description: See Patient Education module for eduction specifics. Outcome: Progressing   Problem: RH SKIN INTEGRITY Goal: RH STG SKIN FREE OF INFECTION/BREAKDOWN Description No new breakdown with mod I assist   Outcome: Progressing Goal: RH STG ABLE TO PERFORM INCISION/WOUND CARE W/ASSISTANCE Description STG Able To Perform Incision/Wound Care With mod I Assistance.  Outcome: Progressing Flowsheets (Taken 06/16/2017 1427) STG: Pt will be able to perform incision/wound care with assistance: 6-Modified independent   Problem: RH SAFETY Goal: RH STG ADHERE TO SAFETY PRECAUTIONS W/ASSISTANCE/DEVICE Description STG Adhere to Safety Precautions With Mod I Assistance/Device.  Outcome: Progressing Flowsheets (Taken 06/16/2017 1427) STG:Pt will adhere to safety precautions with assistance/device: 6-Modified independent   Problem: RH PAIN MANAGEMENT Goal: RH STG PAIN MANAGED AT OR BELOW PT'S PAIN GOAL Description <3 out of 10.   Outcome: Progressing   Problem: RH BOWEL ELIMINATION Goal: RH STG MANAGE BOWEL W/MEDICATION W/ASSISTANCE Description STG Manage Bowel with Medication with min Assistance.  Outcome: Progressing Flowsheets (Taken 06/16/2017 1427) STG: Pt will manage bowels with medication with assistance: 4-Minimal assistance   Problem: RH KNOWLEDGE DEFICIT GENERAL Goal: RH STG INCREASE KNOWLEDGE OF SELF CARE AFTER HOSPITALIZATION Description Increase knowledge on self-care after hospitaization with min assist  Outcome: Progressing

## 2017-06-16 NOTE — Progress Notes (Signed)
Physical Therapy Session Note  Patient Details  Name: Lance [name] MRN: [id number] Date of Birth: 07-31-1970  Today's Date: 06/16/2017 PT Individual Time: 0930-1015 PT Individual Time Calculation (min): 45 min   Short Term Goals: Week 1:  PT Short Term Goal 1 (Week 1): STG=LTG due to ELOS   Skilled Therapeutic Interventions/Progress Updates:    Focused on ramp negotiation for home entry practice w/c level and using RW with education on cues for efficiency and safety. Pt close supervision to steadying assist with RW to ascend/descend ramp - mainly with steadying assist to negotiation threshold part of ramp not actual ramp. Discussed safe placement of RW and positioning. In w/c initially pt requires min assist to navigate and then able to do with supervision. Cues for anterior weightshift when ascending hill and hand placement for slowing descent. Educated on purpose of anti-tippers and importance of being on when in w/c (unless someone bumping him up a curb). Pt verbalized understanding. Performed basic transfers with supervision approaching modified independent for squat pivot including set up of w/c parts. Instructed in quadruped R leg raises and R hip abduction with knee flexed x 10 reps each and rest break in prone after each exercise. W/c mobility mod I on unit.   Therapy Documentation Precautions:  Precautions Precautions: Fall Precaution Comments: wound VAC Required Braces or Orthoses: Other Brace/Splint Other Brace/Splint: residual limb protector Restrictions Weight Bearing Restrictions: Yes RLE Weight Bearing: Non weight bearing Other Position/Activity Restrictions: R LE   Pain: Denies pain.    See Function Navigator for Current Functional Status.   Therapy/Group: Individual Therapy  Canary Brim Ivory Broad, PT, DPT  06/16/2017, 10:44 AM

## 2017-06-16 NOTE — Progress Notes (Signed)
Physical Therapy Session Note  Patient Details  Name: Lance [name] MRN: [id number] Date of Birth: 1970/06/23  Today's Date: 06/16/2017 PT Individual Time: 1030-1125 PT Individual Time Calculation (min): 55 min   Short Term Goals: Week 1:  PT Short Term Goal 1 (Week 1): STG=LTG due to ELOS   Skilled Therapeutic Interventions/Progress Updates: Pt presented in w/c agreeable to therapy. Session focused on activity tolerance and strengthening. Pt propelled to rehab gym distant supervision and participated in standing tolerance with RW. Pt participated in several games of connect four, also using single UE to clear pieces after each round. Pt with c/o mild fatigue after standing apporx 10 min. Pt then participated in [place] steps in parallel bars forwards/backwards 2 x 6 for strengthening. Participated in w/c propulsion throughout unit for endurance and BUE strengthening, propelling distances up to 433f before requiring brief rest. Pt returned to room at end of session and remained in w/c with call bell with reach and needs met.      Therapy Documentation Precautions:  Precautions Precautions: Fall Precaution Comments: wound VAC Required Braces or Orthoses: Other Brace/Splint Other Brace/Splint: residual limb protector Restrictions Weight Bearing Restrictions: Yes RLE Weight Bearing: Non weight bearing Other Position/Activity Restrictions: R LE General:   Vital Signs: Therapy Vitals Temp: 98.9 F (37.2 C) Temp Source: Oral Pulse Rate: 87 Resp: 18 BP: 122/63 Patient Position (if appropriate): Lying Oxygen Therapy SpO2: 93 % O2 Device: Room Air Pain: Pain Assessment Pain Scale: 0-10 Pain Score: 0-No pain   See Function Navigator for Current Functional Status.   Therapy/Group: Individual Therapy  Lance [name]  Willetta York, PTA  06/16/2017, 3:17 PM

## 2017-06-16 NOTE — Progress Notes (Signed)
Physical Therapy Session Note  Patient Details  Name: Lance [name] MRN: [id number] Date of Birth: [birth year]  Today's Date: 06/16/2017 PT Individual Time: 0800-0835 PT Individual Time Calculation (min): 35 min   Short Term Goals: Week 1:  PT Short Term Goal 1 (Week 1): STG=LTG due to ELOS   Skilled Therapeutic Interventions/Progress Updates:    Focused on issuing handout for HEP for functional strengthening and ROM and added a few core exercises and stretching exercises. Pt mod I for bed mobility including getting into prone and sidelying. D/c planning discussed in regards to home mobility (w/c vs walking), HD schedule, densitization techniques and overall process for prosthesis. Pt denies concerns in regards to d/c. Pt instructed in seated core PNF diagonals and trunk rotation x 10 reps each x 2 sets with weighted medicine ball as this was new exercise per pt report.   Exercises  Supine Quad Set (BKA) - 10 reps - 3 sets - 1x daily  Supine Knee to Chest (BKA) - 10 reps - 3 sets - 1x daily  Sidelying Hip Abduction (BKA) - 10 reps - 3 sets - 1x daily  Prone Hip Extension with Residual Limb (BKA) - 10 reps - 3 sets - 1x daily  Supine Single Leg Bridge with Sound Leg (BKA) - 10 reps - 3 sets - 1x daily  Supine Abdominal Crunch - Hands Behind Head (BKA) - 10 reps - 3 sets - 1x daily  Lower Trunk Rotations - 10 reps - 3 sets - 1x daily  Seated Diagonal Chops with Medicine Ball - 10 reps - 3 sets - 1x daily  Seated Trunk Rotation - 10 reps - 3 sets - 1x daily  Seated Hip Adduction Isometrics with Ball - 10 reps - 3 sets - 1x daily - 7x weekly  Seated Knee Extension (BKA) - 10 reps - 3 sets - 1x daily    Therapy Documentation Precautions:  Precautions Precautions: Fall Precaution Comments: wound VAC Required Braces or Orthoses: Other Brace/Splint Other Brace/Splint: residual limb protector Restrictions Weight Bearing Restrictions: Yes RLE Weight Bearing: Non weight bearing Other  Position/Activity Restrictions: R LE   Pain: 6/10 R hip pain - premedicated.    See Function Navigator for Current Functional Status.   Therapy/Group: Individual Therapy  Canary Brim Ivory Broad, PT, DPT  06/16/2017, 8:42 AM

## 2017-06-16 NOTE — Progress Notes (Addendum)
[facility] Progress Note   Subjective: Patient states he may be able to go home. Was supposed to be seen at home training today. Anxious to begin home training. Says emotionally he is OK with amputation. He has been doing well with Rehab. No C/Os.    Objective Vitals:   06/14/17 1659 06/15/17 0536 06/15/17 1421 06/16/17 0506  BP: 113/68 126/69 126/64 122/70  Pulse: 99 91 95 84  Resp: 16 16 16 16   Temp: 98.4 F (36.9 C) 99.3 F (37.4 C) 98.9 F (37.2 C) 98.9 F (37.2 C)  TempSrc: Oral Oral Oral Oral  SpO2: 100% 96% 98% 97%  Weight:      Height:       Physical Exam General: WN, WD NAD Heart: X9,B7 2/6 systolic M.  Lungs: CTAB A/P  Abdomen: active BS Extremities: R.BKA in brace. No edema LLE.  Dialysis Access: RUA AVF + bruit  Additional Objective Labs: Basic Metabolic Panel: Recent Labs  Lab 06/11/17 1639 06/13/17 1712  NA 133* 133*  K 4.4 4.6  CL 90* 92*  CO2 28 26  GLUCOSE 115* 194*  BUN 61* 59*  CREATININE 13.10* 13.02*  CALCIUM 8.3* 7.9*  PHOS 4.2 4.0   Liver Function Tests: Recent Labs  Lab 06/11/17 1639 06/13/17 1712  ALBUMIN 2.1* 2.3*   No results for input(s): LIPASE, AMYLASE in the last 168 hours. CBC: Recent Labs  Lab 06/10/17 0307 06/11/17 1639 06/13/17 1712  WBC 20.8* 19.8* 14.6*  HGB 10.4* 10.0* 9.3*  HCT 31.8* 30.6* 28.9*  MCV 100.3* 101.7* 101.0*  PLT 543* 592* 547*   Blood Culture    Component Value Date/Time   SDES FLUID LEFT PLEURAL 11/30/2015 1426   SDES FLUID LEFT PLEURAL 11/30/2015 1426   SPECREQUEST BOTTLES DRAWN AEROBIC AND ANAEROBIC 10CC 11/30/2015 1426   SPECREQUEST NONE 11/30/2015 1426   CULT NO GROWTH 5 DAYS 11/30/2015 1426   REPTSTATUS 12/06/2015 FINAL 11/30/2015 1426   REPTSTATUS 11/30/2015 FINAL 11/30/2015 1426    Cardiac Enzymes: No results for input(s): CKTOTAL, CKMB, CKMBINDEX, TROPONINI in the last 168 hours. CBG: Recent Labs  Lab 06/15/17 1158 06/15/17 1649 06/15/17 2107  06/16/17 0638 06/16/17 1131  GLUCAP 85 104* 118* 115* 68   Iron Studies: No results for input(s): IRON, TIBC, TRANSFERRIN, FERRITIN in the last 72 hours. @lablastinr3 @ Studies/Results: No results found. Medications: . sodium chloride    . sodium chloride    . methocarbamol (ROBAXIN)  IV     . aspirin EC  325 mg Oral Daily  . calcitRIOL  0.25 mcg Oral Q M,W,F-HD  . calcium acetate  667 mg Oral TID WC  . carvedilol  3.125 mg Oral BID WC  . darbepoetin (ARANESP) injection - DIALYSIS  40 mcg Intravenous Q Mon-HD  . docusate sodium  100 mg Oral BID  . feeding supplement (PRO-STAT SUGAR FREE 64)  30 mL Oral BID  . gabapentin  100 mg Oral Daily  . gabapentin  300 mg Oral Q M,W,F-HD  . hydrocerin   Topical BID  . insulin aspart  0-9 Units Subcutaneous TID WC & HS  . insulin aspart  3 Units Subcutaneous TID WC  . insulin glargine  25 Units Subcutaneous QHS  . pantoprazole  40 mg Oral Daily  . 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 2. ESRD:Continue HD per MWF schedule, next HD today.  3.HTN/volume:BP controlled, ACE-I d/c'd. On low dose Coreg only. EDW lowered to 102kg. 4. Anemia:Hgb 9.3, starting Aranesp 31mcg weekly with next HD. 5. Secondary hyperparathyroidism:Good labs, continue Velphoro/Phoslo as binder. 6. Nutrition:Alb low, but improving, continue Pro-stat. 7. Type 1 DM: On insulin, per primary.  Disposition: Possible DC 06/18/17. Will need HD 1st shift so he will be ready for discharge. He is anxious to begin home hemodialysis-is worried that his plans may be pushed back.      Rita H. Brown NP-C 06/16/2017, 12:12 PM  [employer] [phone]  I was present at this dialysis session. I have reviewed the session itself and made appropriate changes. I have seen and examined this patient and agree with plan and assessment in the above note with renal recommendations/intervention highlighted.  Hopeful discharge today after HD and f/u with home training/[facility]. Broadus John A Malon Siddall,MD 06/16/2017 2:11 PM    Filed Weights   06/13/17 1445 06/13/17 1845 06/16/17 1325  Weight: 104.3 kg (229 lb 15 oz) 102 kg (224 lb 13.9 oz) 104.6 kg (230 lb 9.6 oz)    Recent Labs  Lab 06/13/17 1712  NA 133*  K 4.6  CL 92*  CO2 26  GLUCOSE 194*  BUN 59*  CREATININE 13.02*  CALCIUM 7.9*  PHOS 4.0    Recent Labs  Lab 06/11/17 1639 06/13/17 1712 06/16/17 1349  WBC 19.8* 14.6* 16.2*  HGB 10.0* 9.3* 8.7*  HCT 30.6* 28.9* 27.3*  MCV 101.7* 101.0* 101.5*  PLT 592* 547* 471*    Scheduled Meds: . aspirin EC  325 mg Oral Daily  . calcitRIOL  0.25 mcg Oral Q M,W,F-HD  . calcium acetate  667 mg Oral TID WC  . carvedilol  3.125 mg Oral BID WC  . darbepoetin (ARANESP) injection - DIALYSIS  40 mcg Intravenous Q Mon-HD  . docusate sodium  100 mg Oral BID  . feeding supplement (PRO-STAT SUGAR FREE 64)  30 mL Oral BID  . gabapentin  100 mg Oral Daily  . gabapentin  300 mg Oral Q M,W,F-HD  . hydrocerin   Topical BID  . insulin aspart  0-9 Units Subcutaneous TID WC & HS  . insulin aspart  3 Units Subcutaneous TID WC  . insulin glargine  25 Units Subcutaneous QHS  . pantoprazole  40 mg Oral Daily  . sucroferric oxyhydroxide  1,500 mg Oral TID WC   Continuous Infusions: . sodium chloride    . sodium chloride    . methocarbamol (ROBAXIN)  IV     PRN Meds:.sodium chloride, sodium chloride, acetaminophen, bisacodyl, heparin, heparin, heparin, hydrocerin, lidocaine (PF), lidocaine-prilocaine, methocarbamol **OR** methocarbamol (ROBAXIN)  IV, ondansetron **OR** ondansetron (ZOFRAN) IV, oxyCODONE, pentafluoroprop-tetrafluoroeth,  polyethylene glycol, sorbitol   Donetta Potts,  MD 06/16/2017, 2:11 PM

## 2017-06-16 NOTE — Progress Notes (Signed)
Lance [name]     PROGRESS NOTE  Subjective/Complaints:  Pt seen lying in bed this AM.  He slept well overnight.  He notes improvement in CBGs.  He is ready to resume therapies.   ROS: Denies CP, SOB, nausea, vomiting, diarrhea.  Objective: Vital Signs: Blood pressure 122/70, pulse 84, temperature 98.9 F (37.2 C), temperature source Oral, resp. rate 16, height 6\' 3"  (1.905 m), weight 102 kg (224 lb 13.9 oz), SpO2 97 %. No results found. Recent Labs    06/13/17 1712  WBC 14.6*  HGB 9.3*  HCT 28.9*  PLT 547*   Recent Labs    06/13/17 1712  NA 133*  K 4.6  CL 92*  GLUCOSE 194*  BUN 59*  CREATININE 13.02*  CALCIUM 7.9*   CBG (last 3)  Recent Labs    06/15/17 1649 06/15/17 2107 06/16/17 0638  GLUCAP 104* 118* 115*    Wt Readings from Last 3 Encounters:  06/13/17 102 kg (224 lb 13.9 oz)  06/09/17 106.2 kg (234 lb 2.1 oz)  05/29/17 108.9 kg (240 lb)    Physical Exam:  BP 122/70 (BP Location: Left Arm)   Pulse 84   Temp 98.9 F (37.2 C) (Oral)   Resp 16   Ht 6\' 3"  (1.905 m)   Wt 102 kg (224 lb 13.9 oz)   SpO2 97%   BMI 28.11 kg/m  Constitutional: No distress . Vital signs reviewed. Well-developed. HENT: Normocephalic.  Atraumatic. Eyes: EOMI. No discharge. Cardiovascular: RRR. No JVD. Respiratory: CTA Bilaterally.  Normal effort. GI: BS +. Non-distended. Musc: No edema. +tenderness in right BKA (improving) Neurological: He isalertand oriented  Motor: B/l UE 5/5 prox to distal.  LLE 5/5. RLE: HF 4+/5 (unchanged, pain inhibition) Skin: Right BKA with dressing C/D/I Abrasion on knee from initial fall healing Psychiatric: He has anormal mood and affect. Hisbehavior is normal  Assessment/Plan: 1. Functional deficits secondary to right BKA which require 3+ hours per day of interdisciplinary therapy in a comprehensive inpatient rehab setting. Physiatrist is providing close team supervision and 24 hour management of  active medical problems listed below. Physiatrist and rehab team continue to assess barriers to discharge/monitor patient progress toward functional and medical goals.  Function:  Bathing Bathing position      Bathing parts Body parts bathed by patient: Right arm, Left arm, Chest, Abdomen    Bathing assist Assist Level: Supervision or verbal cues      Upper Body Dressing/Undressing Upper body dressing   What is the patient wearing?: Pull over shirt/dress     Pull over shirt/dress - Perfomed by patient: Thread/unthread right sleeve, Thread/unthread left sleeve, Put head through opening, Pull shirt over trunk          Upper body assist Assist Level: Set up      Lower Body Dressing/Undressing Lower body dressing Lower body dressing/undressing activity did not occur: Refused                                Lower body assist Assist for lower body dressing: Touching or steadying assistance (Pt > 75%)      Toileting Toileting   Toileting steps completed by patient: Performs perineal hygiene, Adjust clothing prior to toileting Toileting steps completed by helper: Adjust clothing after toileting, Adjust clothing prior to toileting Toileting Assistive Devices: Grab bar or rail  Toileting assist Assist level: Touching or steadying assistance (Pt.75%)  Transfers Chair/bed transfer   Chair/bed transfer method: Squat pivot Chair/bed transfer assist level: Supervision or verbal cues Chair/bed transfer assistive device: Armrests, Bedrails     Locomotion Ambulation     Max distance: 40ft  Assist level: Supervision or verbal cues   Wheelchair   Type: Manual Max wheelchair distance: 150 Assist Level: Supervision or verbal cues  Cognition Comprehension Comprehension assist level: Follows complex conversation/direction with no assist  Expression Expression assist level: Expresses complex ideas: With extra time/assistive device  Social Interaction Social Interaction  assist level: Interacts appropriately with others - No medications needed.  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   Continue CIR 2. DVT Prophylaxis/Anticoagulation: SCDs left lower extremity 3. Pain Management/Phantom limb pain:Robaxin and oxycodone as needed  Gabapentin changed to 100 mg daily and 300 mg after HD on 5/2  Encouraged desensitization techniques  Improving 4. Mood:Provide emotional support 5. Neuropsych: This patientiscapable of making decisions on hisown behalf. 6. Skin/Wound Care:Routine skin checks  DCed VAC  7. Fluids/Electrolytes/Nutrition:Routine I/Os  8.End-stage renal disease. Continue hemodialysis as directed -HD MWF after therapy day to allow for participation in therapies 9.Acute on chronic anemia.   Hemoglobin 9.3 on 5/3   Continue to monitor 10.Hypertension. Coreg 3.125 mg twice daily, lisinopril 20 mg daily  Controlled on 5/6 11.Diastolic congestive heart failure. Monitor for any signs of fluid overload Filed Weights   06/12/17 0543 06/13/17 1445 06/13/17 1845  Weight: 104.6 kg (230 lb 9.6 oz) 104.3 kg (229 lb 15 oz) 102 kg (224 lb 13.9 oz)   Stable on 5/5  Management per nephro 12,.Diabetes mellitus with peripheral neuropathy. Hemoglobin A1c 9.1.   Lantus insulin 35 units nightly, decreased to 25 units on 5/4    NovoLog 3 units 3 times daily.   Check blood sugars before meals and at bedtime. Diabetic teaching  Controlled on 5/6  Monitor with increased mobility 13.Tobacco abuse. Counseling 14.Constipation. Laxative assistance 15. Leukocytosis  WBCs 14.6 on 5/3, improving  Labs with HD   Afebrile  Continue to monitor 16. Nausea/vomiting  Resolved  KUB reviewed,  unremarkable  LOS (Days) 6 A FACE TO FACE EVALUATION WAS PERFORMED  Lance [name] Lance [name] 06/16/2017 8:19 AM

## 2017-06-17 ENCOUNTER — Inpatient Hospital Stay (HOSPITAL_COMMUNITY): Payer: Medicare Other | Admitting: Physical Therapy

## 2017-06-17 ENCOUNTER — Inpatient Hospital Stay (HOSPITAL_COMMUNITY): Payer: BLUE CROSS/BLUE SHIELD | Admitting: Occupational Therapy

## 2017-06-17 ENCOUNTER — Encounter (HOSPITAL_COMMUNITY): Payer: Medicare Other | Admitting: Psychology

## 2017-06-17 LAB — GLUCOSE, CAPILLARY
GLUCOSE-CAPILLARY: 199 mg/dL — AB (ref 65–99)
Glucose-Capillary: 166 mg/dL — ABNORMAL HIGH (ref 65–99)
Glucose-Capillary: 53 mg/dL — ABNORMAL LOW (ref 65–99)
Glucose-Capillary: 65 mg/dL (ref 65–99)
Glucose-Capillary: 104 mg/dL — ABNORMAL HIGH (ref 65–99)
Glucose-Capillary: 126 mg/dL — ABNORMAL HIGH (ref 65–99)

## 2017-06-17 MED ORDER — DARBEPOETIN ALFA 60 MCG/0.3ML IJ SOSY: 60.0000 ug | PREFILLED_SYRINGE | INTRAMUSCULAR | Status: DC

## 2017-06-17 NOTE — Progress Notes (Addendum)
[facility] Progress Note   Subjective: Up in wheelchair, having issues with hypoglycemia, BS dropped to 53. Slightly diaphoretic, BS now 65 after snack. No C/Os. Excited to being going home tomorrow. Discussed new lower EDW.   Objective Vitals:   06/16/17 2052 06/16/17 2122 06/17/17 0531 06/17/17 0803  BP: 137/77 (!) 145/74 122/71 124/73  Pulse: (!) 118 99 82 84  Resp: 15     Temp: 99.2 F (37.3 C)     TempSrc: Oral     SpO2: 99%     Weight:      Height:       Physical Exam General: WN, WD NAD Heart: J0,D3 2/6 systolic M.  Lungs: CTAB A/P  Abdomen: active BS Extremities: R.BKA in brace. No edema LLE.  Dialysis Access: RUA AVF + bruit   Additional Objective Labs: Basic Metabolic Panel: Recent Labs  Lab 06/11/17 1639 06/13/17 1712 06/16/17 1349  NA 133* 133* 134*  K 4.4 4.6 4.9  CL 90* 92* 90*  CO2 28 26 28   GLUCOSE 115* 194* 99  BUN 61* 59* 73*  CREATININE 13.10* 13.02* 14.38*  CALCIUM 8.3* 7.9* 8.3*  PHOS 4.2 4.0 4.0   Liver Function Tests: Recent Labs  Lab 06/11/17 1639 06/13/17 1712 06/16/17 1349  ALBUMIN 2.1* 2.3* 2.2*   No results for input(s): LIPASE, AMYLASE in the last 168 hours. CBC: Recent Labs  Lab 06/11/17 1639 06/13/17 1712 06/16/17 1349  WBC 19.8* 14.6* 16.2*  HGB 10.0* 9.3* 8.7*  HCT 30.6* 28.9* 27.3*  MCV 101.7* 101.0* 101.5*  PLT 592* 547* 471*   Blood Culture    Component Value Date/Time   SDES FLUID LEFT PLEURAL 11/30/2015 1426   SDES FLUID LEFT PLEURAL 11/30/2015 1426   SPECREQUEST BOTTLES DRAWN AEROBIC AND ANAEROBIC 10CC 11/30/2015 1426   SPECREQUEST NONE 11/30/2015 1426   CULT NO GROWTH 5 DAYS 11/30/2015 1426   REPTSTATUS 12/06/2015 FINAL 11/30/2015 1426   REPTSTATUS 11/30/2015 FINAL 11/30/2015 1426    Cardiac Enzymes: No results for input(s): CKTOTAL, CKMB, CKMBINDEX, TROPONINI in the last 168 hours. CBG: Recent Labs  Lab 06/16/17 1828 06/16/17 2050 06/17/17 0604 06/17/17 1124 06/17/17 1150   GLUCAP 155* 181* 166* 53* 65   Iron Studies: No results for input(s): IRON, TIBC, TRANSFERRIN, FERRITIN in the last 72 hours. @lablastinr3 @ Studies/Results: No results found. Medications: . methocarbamol (ROBAXIN)  IV     . aspirin EC  325 mg Oral Daily  . calcitRIOL  0.25 mcg Oral Q M,W,F-HD  . calcium acetate  667 mg Oral TID WC  . carvedilol  3.125 mg Oral BID WC  . darbepoetin (ARANESP) injection - DIALYSIS  40 mcg Intravenous Q Mon-HD  . docusate sodium  100 mg Oral BID  . feeding supplement (PRO-STAT SUGAR FREE 64)  30 mL Oral BID  . gabapentin  100 mg Oral Daily  . gabapentin  300 mg Oral Q M,W,F-HD  . hydrocerin   Topical BID  . insulin aspart  0-9 Units Subcutaneous TID WC & HS  . insulin aspart  3 Units Subcutaneous TID WC  . insulin glargine  25 Units Subcutaneous QHS  . pantoprazole  40 mg Oral Daily  . 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 DC home 06/18/17 2. ESRD:Continue HD per MWF schedule, next HD 06/18/17 1st shift. K+ 4.9  3.HTN/volume:BPcontrolled, ACE-I d/c'd. On low dose Coreg only. Lower EDW to 100.5 kg. May need to adjust lower upon DC.  4. Anemia:Hgb 8.7,declining. Increase Aranesp 60 mcg tomorrow with HD HD. 5. Secondary hyperparathyroidism:Good labs, continue Velphoro/Phoslo as binder. 6. Nutrition:Alb low 2.2,Pro-stat. Renal vit.  7. Type 1 DM: On insulin, per primary. Having issues with hypoglycemia this AM.   Disposition: Possible DC 06/18/17. Will need HD 1st shift so he will be ready for discharge. He is anxious to begin home hemodialysis-is worried that his plans may be pushed back.     Rita H. Brown  NP-C 06/17/2017, 11:53 AM  [employer] [phone]  I have seen and examined this patient and agree with plan and assessment in the above note with renal recommendations/intervention highlighted.  Pt feels well and is happy to be going home.  Will f/u with Dr. Jimmy Footman about home training after discharge.  Broadus John A Jimmi Sidener,MD 06/17/2017 2:11 PM

## 2017-06-17 NOTE — Progress Notes (Signed)
[place] [facility]     PROGRESS NOTE  Subjective/Complaints:  Patient seen lying in bed this morning. He states he slept well overnight. He is making good progress in therapies. He is happy about early discharge.  ROS: denies CP, SOB, nausea, vomiting, diarrhea.  Objective: Vital Signs: Blood pressure 122/71, pulse 82, temperature 99.2 F (37.3 C), temperature source Oral, resp. rate 15, height 6\' 3"  (1.905 m), weight 100.6 kg (221 lb 12.5 oz), SpO2 99 %. No results found. Recent Labs    06/16/17 1349  WBC 16.2*  HGB 8.7*  HCT 27.3*  PLT 471*   Recent Labs    06/16/17 1349  NA 134*  K 4.9  CL 90*  GLUCOSE 99  BUN 73*  CREATININE 14.38*  CALCIUM 8.3*   CBG (last 3)  Recent Labs    06/16/17 1828 06/16/17 2050 06/17/17 0604  GLUCAP 155* 181* 166*    Wt Readings from Last 3 Encounters:  06/16/17 100.6 kg (221 lb 12.5 oz)  06/09/17 106.2 kg (234 lb 2.1 oz)  05/29/17 108.9 kg (240 lb)    Physical Exam:  BP 122/71   Pulse 82   Temp 99.2 F (37.3 C) (Oral)   Resp 15   Ht 6\' 3"  (1.905 m)   Wt 100.6 kg (221 lb 12.5 oz)   SpO2 99%   BMI 27.72 kg/m  Constitutional: No distress . Vital signs reviewed. Well-developed. HENT: Normocephalic.  Atraumatic. Eyes: EOMI. No discharge. Cardiovascular: RRR. No JVD. Respiratory: CTA Bilaterally.  Normal effort. GI: BS +. Non-distended. Musc: No edema. +tenderness in right BKA (improving) Neurological: He isalertand oriented  Motor: B/l UE 5/5 prox to distal.  LLE 5/5. RLE: HF 4+/5 (stable, pain inhibition) Skin: Right BKA with dressing C/D/I Abrasion on knee from initial fall healing Psychiatric: He has anormal mood and affect. Hisbehavior is normal  Assessment/Plan: 1. Functional deficits secondary to right BKA which require 3+ hours per day of interdisciplinary therapy in a comprehensive inpatient rehab setting. Physiatrist is providing close team supervision and 24 hour  management of active medical problems listed below. Physiatrist and rehab team continue to assess barriers to discharge/monitor patient progress toward functional and medical goals.  Function:  Bathing Bathing position      Bathing parts Body parts bathed by patient: Right arm, Left arm, Chest, Abdomen    Bathing assist Assist Level: Supervision or verbal cues      Upper Body Dressing/Undressing Upper body dressing   What is the patient wearing?: Pull over shirt/dress     Pull over shirt/dress - Perfomed by patient: Thread/unthread right sleeve, Thread/unthread left sleeve, Put head through opening, Pull shirt over trunk          Upper body assist Assist Level: Set up      Lower Body Dressing/Undressing Lower body dressing Lower body dressing/undressing activity did not occur: Refused                                Lower body assist Assist for lower body dressing: Touching or steadying assistance (Pt > 75%)      Toileting Toileting   Toileting steps completed by patient: Performs perineal hygiene, Adjust clothing prior to toileting Toileting steps completed by helper: Adjust clothing after toileting, Adjust clothing prior to toileting Toileting Assistive Devices: Grab bar or rail  Toileting assist Assist level: Touching or steadying assistance (Pt.75%)   Transfers Chair/bed transfer  Chair/bed transfer method: Squat pivot Chair/bed transfer assist level: Supervision or verbal cues Chair/bed transfer assistive device: Armrests, Orthosis     Locomotion Ambulation     Max distance: 30' Assist level: Supervision or verbal cues   Wheelchair   Type: Manual Max wheelchair distance: 150 Assist Level: No help, No cues, assistive device, takes more than reasonable amount of time  Cognition Comprehension Comprehension assist level: Follows complex conversation/direction with no assist  Expression Expression assist level: Expresses complex ideas: With no  assist  Social Interaction Social Interaction assist level: Interacts appropriately with others - No medications needed.  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   Continue CIR, plan for DC tomorrow 2. DVT Prophylaxis/Anticoagulation: SCDs left lower extremity 3. Pain Management/Phantom limb pain:Robaxin and oxycodone as needed  Gabapentin changed to 100 mg daily and 300 mg after HD on 5/2  Encouraged desensitization techniques  Continues to improve 4. Mood:Provide emotional support 5. Neuropsych: This patientiscapable of making decisions on hisown behalf. 6. Skin/Wound Care:Routine skin checks  DCed VAC  7. Fluids/Electrolytes/Nutrition:Routine I/Os  8.End-stage renal disease. Continue hemodialysis as directed -HD MWF after therapy day to allow for participation in therapies 9.Acute on chronic anemia.   Hemoglobin 8.7 on 5/6   Continue to monitor 10.Hypertension. Coreg 3.125 mg twice daily, lisinopril 20 mg daily  Relatively controlled on 5/7 11.Diastolic congestive heart failure. Monitor for any signs of fluid overload Filed Weights   06/13/17 1845 06/16/17 1325 06/16/17 1743  Weight: 102 kg (224 lb 13.9 oz) 104.6 kg (230 lb 9.6 oz) 100.6 kg (221 lb 12.5 oz)   ? Reliability  Management per nephro 12,.Diabetes mellitus with peripheral neuropathy. Hemoglobin A1c 9.1.   Lantus insulin 35 units nightly, decreased to 25 units on 5/4    NovoLog 3 units 3 times daily.   Check blood sugars before meals and at bedtime. Diabetic teaching  Slightly labile on 5/7  Monitor with increased mobility 13.Tobacco abuse. Counseling 14.Constipation. Laxative assistance 15. Leukocytosis  WBCs 15.2 on 5/6  Labs with HD    Afebrile  Continue to monitor 16. Nausea/vomiting  Resolved  KUB reviewed, unremarkable  LOS (Days) 7 A FACE TO FACE EVALUATION WAS PERFORMED  Ankit Lorie Phenix 06/17/2017 7:58 AM

## 2017-06-17 NOTE — Plan of Care (Signed)
  Problem: Consults Goal: RH LIMB LOSS PATIENT EDUCATION Description Description: See Patient Education module for eduction specifics. Outcome: Progressing   Problem: RH SKIN INTEGRITY Goal: RH STG SKIN FREE OF INFECTION/BREAKDOWN Description No new breakdown with mod I assist   Outcome: Progressing Goal: RH STG ABLE TO PERFORM INCISION/WOUND CARE W/ASSISTANCE Description STG Able To Perform Incision/Wound Care With mod I Assistance.  Outcome: Progressing   Problem: RH SAFETY Goal: RH STG ADHERE TO SAFETY PRECAUTIONS W/ASSISTANCE/DEVICE Description STG Adhere to Safety Precautions With Mod I Assistance/Device.  Outcome: Progressing   Problem: RH PAIN MANAGEMENT Goal: RH STG PAIN MANAGED AT OR BELOW PT'S PAIN GOAL Description <3 out of 10.   Outcome: Progressing   Problem: RH BOWEL ELIMINATION Goal: RH STG MANAGE BOWEL W/MEDICATION W/ASSISTANCE Description STG Manage Bowel with Medication with min Assistance.  Outcome: Progressing   Problem: RH KNOWLEDGE DEFICIT GENERAL Goal: RH STG INCREASE KNOWLEDGE OF SELF CARE AFTER HOSPITALIZATION Description Increase knowledge on self-care after hospitaization with min assist  Outcome: Progressing

## 2017-06-17 NOTE — Progress Notes (Signed)
Hypoglycemic Event  CBG: 56  Treatment: 15 GM carbohydrate snack  Symptoms: None  Follow-up CBG: Time:1150 CBG Result:65  Possible Reasons for Event: Unknown  Comments/MD notified:yes  Rechecked at 1242 after lunch, CBG=104  Milus Mallick

## 2017-06-17 NOTE — Consult Note (Signed)
Neuropsychological Consultation   Patient:   Lance [name]   DOB:   1970/05/30  MR Number:  [id number]  Location:  [place] [address] [facility] [address] [id number] [place] [address] Dept: [place]: [phone]           Date of Service:   06/17/2017  Start Time:   2:30 PM End Time:   3:30 PM  Provider/Observer:  Ilean Skill, Psy.D.       Clinical Neuropsychologist       Billing Code/Service: [phone] 4 Units  Chief Complaint:    Lance [name] is a 47 year old male with history of ESRD with hemodialysis, diastolic congestive heart failure, PSVT, diabetes mellitus, and peripheral vascular disease.  PResent on 06/06/2017 with gangrenous changes in right foot.  Underwent right transtibial amputation on 06/06/2017.  Initially, patient had difficulty coping with amputation and impact it would have on life going forward.  He is coping better now that he is healing and looking forward to having rehab and learning to cope.  Patient reports that mood really improved over past few days.  Patient is to be discharged home tomorrow.  Reason for Service:  Lance [name] was referred for neuropsychological consultation to deal with adjustment and coping issues that had developed.  Below is the HPI for the current admission.  HPI:Lance [name] is a 47 year old right-handed male with history of end-stage renal disease with hemodialysis, diastolic congestive heart failure, PSVT, diabetes mellitus, tobacco abuse as well as peripheral vascular disease. He lives with his wife and 76 year old daughter. They have a 82 year old son who is in college. One level home with planned ramped entrance. Wife works during the day. He does have a mother close by who can assist. Presented 06/06/2017 with gangrenous changes of the right foot. Recent vascular studies by Dr. Trula Slade showed no revascularization options.  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 Sharol Given. Wound VAC applied. Hospital course pain management. Hemodialysis ongoing as per renal services. Acute on chronic anemia 10.4 and monitored. Leukocytosis 20,800. Physical and occupational therapy evaluations completed with recommendations of physical medicine rehab consult. Patient was admitted for a comprehensive rehab program    Current Status:  The patient reports that he has been coping and adjusting much better over the past couple of days.  He reports that he really had a hard time initially after the surgery and was very scared after having a fall on the first or second day after surgery.  The patient reports that as he has begun to heal and feeling more confident about transfers that he is adjusting better and looking forward to learning how to use a prosthetic.   Behavioral Observation: Lance [name]  presents as a 47 y.o.-year-old Right African American Male who appeared his stated age. his dress was Appropriate and he was Well Groomed and his manners were Appropriate to the situation.  his participation was indicative of Appropriate and Attentive behaviors.  There were any physical disabilities noted.  he displayed an appropriate level of cooperation and motivation.     Interactions:    Active Appropriate and Attentive  Attention:   within normal limits and attention span and concentration were age appropriate  Memory:   within normal limits; recent and remote memory intact  Visuo-spatial:  not examined  Speech (Volume):  normal  Speech:   normal; normal  Thought Process:  Coherent and Relevant  Though  Content:  WNL; not suicidal and not homicidal  Orientation:   person, place, time/date and situation  Judgment:   Good  Planning:   Good  Affect:    Appropriate  Mood:    NA  Insight:   Good  Intelligence:   high  Medical History:   Past Medical History:  Diagnosis Date  . Aortic stenosis     Very mild in 05/2007  . Arthritis    "right shoulder; right knee; feel like I've got it all over" (11/28/2015)  . CHF (congestive heart failure) ([place])   . Degenerative joint disease    Left TKA  . Diastolic heart failure (H[facility])    LVEF 65-70% with grade 2 diastolic dysfunction  . ESRD (end stage renal disease) on dialysis ([place])    "MWF; [facility] on [address]" (11/28/2015)  . Essential hypertension, benign 2000   LVH  . GERD (gastroesophageal reflux disease)   . HCAP (healthcare-associated pneumonia) 11/28/2015  . Hyperlipidemia 2000  . Loculated pleural effusion 11/28/2015   [name] 11/28/2015  . Pneumonia 12/2013; 05/2014  . PSVT (paroxysmal supraventricular tachycardia) (HCC)    Nonsustained; asymptomatic; diagnosed by event recorder in 2006  . Tobacco abuse, in remission    20 pack years; discontinued in 1985; bronchitic changes on chest x-ray and 2009  . Type 1 diabetes mellitus ([place]) 1990   Psychiatric History:  No prior history of depression or anxiety.  Family Med/Psych History:  Family History  Problem Relation Age of Onset  . Diabetes Father   . Hypertension Father   . Diabetes Mother   . Hypertension Mother     Risk of Suicide/Violence: virtually non-existent   Impression/DX:  Lance [name] is a 47 year old male with history of ESRD with hemodialysis, diastolic congestive heart failure, PSVT, diabetes mellitus, and peripheral vascular disease.  PResent on 06/06/2017 with gangrenous changes in right foot.  Underwent right transtibial amputation on 06/06/2017.  Initially, patient had difficulty coping with amputation and impact it would have on life going forward.  He is coping better now that he is healing and looking forward to having rehab and learning to cope.  Patient reports that mood really improved over past few days.  Patient is to be discharged home tomorrow.  The patient reports that he has been coping and adjusting much better over the past couple of days.   He reports that he really had a hard time initially after the surgery and was very scared after having a fall on the first or second day after surgery.  The patient reports that as he has begun to heal and feeling more confident about transfers that he is adjusting better and looking forward to learning how to use a prosthetic.   Diagnosis:    Amputation of right lower extremity below knee ([place]) - Plan: Ambulatory referral to Physical Medicine Rehab  Vomiting - Plan: DG Abd 1 View, DG Abd 1 View         Electronically Signed   _______________________ Ilean Skill, Psy.D.

## 2017-06-17 NOTE — Progress Notes (Signed)
Physical Therapy Session Note  Patient Details  Name: Lance [name] MRN: [id number] Date of Birth: 1970-10-18  Today's Date: 06/17/2017 PT Individual Time: 3612-2449 PT Individual Time Calculation (min): 60 min   Short Term Goals: Week 1:  PT Short Term Goal 1 (Week 1): STG=LTG due to ELOS   Skilled Therapeutic Interventions/Progress Updates: Tx1: Pt presented in bed agreeable to therapy. Performed squat pivot transfer to w/c mod I. Pt participated in various functional mobility activities at supervision or mod I level (see function tab). Pt verbalized feeling comfortable regarding d/c planning and HEP provided. Pt participated in seated core/UE therex with use weighted ball. Pt returned to room at end of session and remained in w/c with call bell within reach and needs met.    Tx2: Pt presented in bed agreeable to therapy. Performed bed mobility mod I and performed squat pivot transfer to w/c mod I. Pt participated in propulsion to [employer]	[place] waiting area for endurance and UE strengthening. Pt returned to unit and participated in core strengthening and residual limb strengthening. Pt required intermittent breaks due to fatigue. Pt returned to room and returned to w/c in same manner as prior. Pt left in bed at end of session with all needs met.      Therapy Documentation Precautions:  Precautions Precautions: Fall Precaution Comments: wound VAC Required Braces or Orthoses: Other Brace/Splint Other Brace/Splint: residual limb protector Restrictions Weight Bearing Restrictions: Yes RLE Weight Bearing: Non weight bearing Other Position/Activity Restrictions: R LE General:   Vital Signs: Therapy Vitals Temp: 98 F (36.7 C) Temp Source: Oral Pulse Rate: 82 Resp: 19 BP: 135/88 Patient Position (if appropriate): Lying Oxygen Therapy SpO2: 100 % O2 Device: Room Air Pain: Pain Assessment Pain Scale: 0-10 Pain Score: 6  Pain Type: Surgical pain Pain Location: Leg Pain  Orientation: Right Pain Descriptors / Indicators: Aching Pain Frequency: Intermittent Pain Onset: On-going Patients Stated Pain Goal: 4 Pain Intervention(s): Medication (See eMAR) Mobility: Bed Mobility Bed Mobility: Rolling Right;Rolling Left;Sit to Supine;Supine to Sit Rolling Right: 6: Modified independent (Device/Increase time) Rolling Left: 6: Modified independent (Device/Increase time) Supine to Sit: 6: Modified independent (Device/Increase time) Sit to Supine: 6: Modified independent (Device/Increase time) Transfers Transfers: Yes Sit to Stand: 6: Modified independent (Device/Increase time) Stand to Sit: 6: Modified independent (Device/Increase time) Squat Pivot Transfers: 6: Modified independent (Device/Increase time) Locomotion : Ambulation Ambulation: Yes Ambulation/Gait Assistance: 5: Supervision Ambulation Distance (Feet): 53 Feet Assistive device: Rolling walker Gait Gait: Yes Gait Pattern: Impaired Gait Pattern: Step-to pattern Stairs / Additional Locomotion Stairs: Yes Stairs Assistance: 5: Supervision Stair Management Technique: Two rails Number of Stairs: 4 Product manager Assistance: 6: Modified independent (Device/Increase time) Environmental health practitioner: Both upper extremities Wheelchair Parts Management: Independent Distance: 180f  Trunk/Postural Assessment : Cervical Assessment Cervical Assessment: Within Functional Limits Thoracic Assessment Thoracic Assessment: Within Functional Limits Lumbar Assessment Lumbar Assessment: Within Functional Limits Postural Control Postural Control: Within Functional Limits  Balance: Balance Balance Assessed: Yes Static Sitting Balance Static Sitting - Level of Assistance: 6: Modified independent (Device/Increase time) Dynamic Sitting Balance Dynamic Sitting - Level of Assistance: 6: Modified independent (Device/Increase time) Static Standing Balance Static Standing - Level of Assistance: 6:  Modified independent (Device/Increase time) Dynamic Standing Balance Dynamic Standing - Level of Assistance: 5: Stand by assistance Exercises:   Other Treatments:     See Function Navigator for Current Functional Status.   Therapy/Group: Individual Therapy  Harley Mccartney 06/17/2017, 4:25 PM

## 2017-06-17 NOTE — Progress Notes (Signed)
Physical Therapy Discharge Summary  Patient Details  Name: Lance [name] MRN: [id number] Date of Birth: 15-Dec-1970  Today's Date: 06/17/2017 PT Individual Time: 9937-1696 PT Individual Time Calculation (min): 60 min    Patient has met 10 of 10 long term goals due to improved activity tolerance, improved balance, increased strength, decreased pain, improved attention, improved awareness and improved coordination.  Patient to discharge at a wheelchair level Modified Independent.   Patient's care partner is independent to provide the necessary physical assistance at discharge.  Reasons goals not met: all PT goals met  Recommendation:  Patient will benefit from ongoing skilled PT services in outpatient setting to continue to advance safe functional mobility, address ongoing impairments in balance, sensation, coordination, gait, strength, endurance, and minimize fall risk.  Equipment: RW  Reasons for discharge: treatment goals met  Patient/family agrees with progress made and goals achieved: Yes  PT Discharge Precautions/Restrictions Precautions Precautions: Fall Required Braces or Orthoses: Other Brace/Splint Other Brace/Splint: residual limb protector Restrictions Weight Bearing Restrictions: Yes RLE Weight Bearing: Non weight bearing Vital Signs Therapy Vitals Temp: 98 F (36.7 C) Temp Source: Oral Pulse Rate: 82 Resp: 19 BP: 135/88 Patient Position (if appropriate): Lying Oxygen Therapy SpO2: 100 % O2 Device: Room Air Pain Pain Assessment Pain Scale: 0-10 Pain Score: 6  Pain Type: Surgical pain Pain Location: Leg Pain Orientation: Right Pain Descriptors / Indicators: Aching Pain Frequency: Intermittent Pain Onset: On-going Patients Stated Pain Goal: 4 Pain Intervention(s): Medication (See eMAR) Vision/Perception  Perception Perception: Within Functional Limits Praxis Praxis: Intact  Cognition Overall Cognitive Status: Within Functional Limits for  tasks assessed Arousal/Alertness: Awake/alert Orientation Level: Oriented X4 Attention: Alternating Alternating Attention: Appears intact Memory: Appears intact Awareness: Appears intact Problem Solving: Appears intact Safety/Judgment: Appears intact Sensation Sensation Light Touch: Impaired Detail Light Touch Impaired Details: Impaired RLE;Impaired LLE Additional Comments: poor light touch in the distal LLE. 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 Significantly improved from Eval.  Mobility Bed Mobility Bed Mobility: Rolling Right;Rolling Left;Sit to Supine;Supine to Sit Rolling Right: 6: Modified independent (Device/Increase time) Rolling Left: 6: Modified independent (Device/Increase time) Supine to Sit: 6: Modified independent (Device/Increase time) Sit to Supine: 6: Modified independent (Device/Increase time) Transfers Transfers: Yes Sit to Stand: 6: Modified independent (Device/Increase time) Stand to Sit: 6: Modified independent (Device/Increase time) Squat Pivot Transfers: 6: Modified independent (Device/Increase time) Locomotion  Ambulation Ambulation: Yes Ambulation/Gait Assistance: 5: Supervision Ambulation Distance (Feet): 53 Feet Assistive device: Rolling walker Gait Gait: Yes Gait Pattern: Impaired Gait Pattern: Step-to pattern Stairs / Additional Locomotion Stairs: Yes Stairs Assistance: 5: Supervision Stair Management Technique: Two rails Number of Stairs: 4 Product manager Assistance: 6: Modified independent (Device/Increase time) Environmental health practitioner: Both upper extremities Wheelchair Parts Management: Independent Distance: 117f  Trunk/Postural Assessment  Cervical Assessment Cervical Assessment: Within Functional Limits Thoracic Assessment Thoracic  Assessment: Within Functional Limits Lumbar Assessment Lumbar Assessment: Within Functional Limits Postural Control Postural Control: Within Functional Limits  Balance Balance Balance Assessed: Yes Static Sitting Balance Static Sitting - Level of Assistance: 6: Modified independent (Device/Increase time) Dynamic Sitting Balance Dynamic Sitting - Level of Assistance: 6: Modified independent (Device/Increase time) Static Standing Balance Static Standing - Level of Assistance: 6: Modified independent (Device/Increase time) Dynamic Standing Balance Dynamic Standing - Level of Assistance: 5: Stand by assistance Extremity Assessment  RUE Assessment  RUE Assessment: Within Functional Limits LUE Assessment LUE Assessment: Within Functional Limits RLE Assessment RLE Assessment: Exceptions to [facility] RLE AROM (degrees) RLE Overall AROM Comments: approx 0-90 degrees flexion RLE Strength RLE Overall Strength Comments: overall 4+/5 hip flexion, abd/add LLE Assessment LLE Assessment: Within Functional Limits   See Function Navigator for Current Functional Status.  Lance [name]  Lance [name], PTA 06/17/2017, 4:28 PM

## 2017-06-17 NOTE — Discharge Summary (Signed)
Discharge summary job [phone]

## 2017-06-17 NOTE — Progress Notes (Signed)
Occupational Therapy Discharge Summary  Patient Details  Name: Lance [name] MRN: [id number] Date of Birth: 1970/03/01  Patient has met 7 of 8 long term goals due to improved activity tolerance, improved balance, postural control, ability to compensate for deficits and improved awareness.  Patient to discharge at overall Modified Independent level.  Patient's care partner is independent to provide the necessary intermittent assistance at discharge.    Reasons goals not met: Pt requires supervision for tub/shower transfers due to setup of home shower.  Recommendation:  Patient will benefit from ongoing skilled OT services in home health setting to continue to advance functional skills in the area of BADL and Reduce care partner burden.  Equipment: tub transfer bench  Reasons for discharge: treatment goals met and discharge from hospital  Patient/family agrees with progress made and goals achieved: Yes  OT Discharge Precautions/Restrictions  Precautions Precautions: Fall Required Braces or Orthoses: Other Brace/Splint Other Brace/Splint: residual limb protector Restrictions Weight Bearing Restrictions: Yes RLE Weight Bearing: Non weight bearing Pain  Pt with no c/o pain ADL ADL ADL Comments: Please see functional navigator Vision Baseline Vision/History: No visual deficits Patient Visual Report: No change from baseline Vision Assessment?: No apparent visual deficits Perception  Perception: Within Functional Limits Praxis Praxis: Intact Cognition Overall Cognitive Status: Within Functional Limits for tasks assessed Arousal/Alertness: Awake/alert Orientation Level: Oriented X4 Attention: Alternating Alternating Attention: Appears intact Memory: Appears intact Awareness: Appears intact Problem Solving: Appears intact Safety/Judgment: Appears intact Sensation Sensation Light Touch: Impaired Detail Light Touch Impaired Details: Impaired RLE;Impaired  LLE Additional Comments: poor light touch in the distal LLE. phantom sensations and pain in the RLE Coordination Gross Motor Movements are Fluid and Coordinated: Yes Fine Motor Movements are Fluid and Coordinated: Yes Finger Nose Finger Test: [facility] Extremity/Trunk Assessment RUE Assessment RUE Assessment: Within Functional Limits LUE Assessment LUE Assessment: Within Functional Limits   See Function Navigator for Current Functional Status.  Simonne Come 06/17/2017, 1:48 PM

## 2017-06-17 NOTE — Progress Notes (Signed)
Occupational Therapy Session Note  Patient Details  Name: Lance [name] MRN: [id number] Date of Birth: February 14, 1970  Today's Date: 06/17/2017 OT Individual Time: 0998-3382 OT Individual Time Calculation (min): 85 min    Short Term Goals: STG = LTGs  Skilled Therapeutic Interventions/Progress Updates:    Completed ADL retraining at overall Mod I level.  Pt's mother and father present for family education during bathing/dressing at shower level.  Pt completed stand pivot transfer into room shower with supervision/setup.  Bathing completed with lateral leans to wash buttocks at Mod I level.  Pt completed dressing Mod I at sit > stand level.  Educated on residual limb wrapping and provided pt with handout for wrapping technique.  Pt able to verbalize understanding, but states that his wife will most likely be doing the wrapping (and she was not present).  Provided pt with inspection mirror and education on limb inspection, desensitization strategies, and massage with pt and family reporting understanding.  Pt propelled w/c to ADL tubroom Mod I and positioned w/c next to tub/shower without cues.  Recommend supervision for transfers as pt reports bathroom setup somewhat different from simulated, reports wife will be present when he bathes at shower level.  Discussed follow up recommendations with pt understanding and reporting ready for d/c.  Therapy Documentation Precautions:  Precautions Precautions: Fall Precaution Comments: wound VAC Required Braces or Orthoses: Other Brace/Splint Other Brace/Splint: residual limb protector Restrictions Weight Bearing Restrictions: Yes RLE Weight Bearing: Non weight bearing Other Position/Activity Restrictions: R LE Pain: Pain Assessment Pain Scale: 0-10 Pain Score: 2   See Function Navigator for Current Functional Status.   Therapy/Group: Individual Therapy  Simonne Come 06/17/2017, 12:24 PM

## 2017-06-17 NOTE — Discharge Summary (Signed)
NAME: Lance [name], Lance [name] MEDICAL RECORD TI:[id number] ACCOUNT [id number] DATE OF BIRTH:May 25, 1970 FACILITY: [facility] LOC[facility] PHYSICIAN:ANKIT PATEL, MD  DISCHARGE SUMMARY  DATE OF DISCHARGE:  06/18/2017  DISCHARGE DIAGNOSES: 1.  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 2. Sequential compression devices, left lower extremity for deep venous thrombosis prophylaxis.   3.  Pain management.   4.  End-stage renal disease.   5.  Acute on chronic anemia.   6.  Hypertension. 7.  Diastolic congestive heart failure. 8.  Diabetes mellitus. 9.  Peripheral neuropathy. 10.  Tobacco abuse.   11.  Constipation.  HISTORY OF PRESENT ILLNESS:  This is a 47 year old right-handed male with a history of end-stage renal disease, diastolic congestive heart failure, diabetes mellitus, tobacco abuse.  He lives with his wife and 107 year old daughter.  He has a son in  college.  Wife works during the day.  Presented 06/06/2017 with gangrenous changes of the right foot.  Recent vascular study showed no revascularization options.  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  Sharol Given.  Wound VAC applied.  HOSPITAL COURSE:  Pain management.  Hemodialysis as per renal services.  Acute on chronic anemia 10.4 and monitored.  Leukocytosis 20,800.  Physical and occupational therapy ongoing.  The patient was admitted for comprehensive rehabilitation program.  PAST MEDICAL HISTORY:  See discharge diagnoses.  SOCIAL HISTORY:  Lives with wife and 49 year old daughter.  FUNCTIONAL STATUS:  Upon admission to rehabilitation services, minimal guard 30 feet rolling walker, min/mod assist with activities of daily living.  PHYSICAL EXAMINATION: VITAL SIGNS:  Blood pressure 117/71, pulse 93, temperature 99, respirations 20. GENERAL:  Alert male in no acute distress.  EOMs intact. NECK:  Supple, nontender.  No JVD. CARDIOVASCULAR:  Rate controlled. ABDOMEN:  Soft, nontender, good bowel sounds. LUNGS:  Clear  to auscultation without wheeze. EXTREMITIES:  Right below knee amputation in a limb guard with a wound VAC.  REHABILITATION HOSPITAL COURSE:  The patient was admitted to inpatient rehabilitation services.  Therapies initiated on a 3-hour daily basis, consisting of physical therapy, occupational therapy and rehabilitation nursing.  The following issues were  addressed during patient's rehabilitation stay:  Pertaining to the patient's right transtibial amputation 06/06/2017.  Limb guard in place.  Follow up per orthopedic services Dr. Sharol Given.  Pain management with the use of Robaxin, oxycodone as well as  Neurontin 100 mg daily and 300 mg Monday, Wednesday, Friday with dialysis.  He will continue hemodialysis as per renal services.  Acute on chronic anemia 9.3 and monitored.  Blood pressure is controlled with Coreg as well as lisinopril.  The patient  exhibited no other signs of fluid overload.  Diabetes mellitus, peripheral neuropathy.  Hemoglobin A1c of 9.1.  Lantus insulin as advised with NovoLog and diabetic teaching.  The patient did have a history of tobacco abuse, receiving full counseling  regards to cessation of nicotine products.  Bouts of nausea and vomiting, improved.  Continued on Protonix.  KUB unremarkable.  The patient received weekly collaborative interdisciplinary team conferences to discuss estimated length of stay, family  teaching, any barriers to discharge.  Sessions focused on activity tolerance, propelled his wheelchair to the gymnasium with supervision and participated in standing with a rolling walker.  Participated in 6-inch steps with parallel bars, propelling his  wheelchair greater than 400 feet.  Activities of daily living and homemaking.  Patient able to bathe at dress and sink side.  Engaged in dynamic standing balance.  Full family teaching completed and planned discharge to home.  DISCHARGE MEDICATIONS:  Included aspirin 325 mg p.o. daily, Rocaltrol 0.25 mcg p.o. Monday,  Wednesday, Friday, PhosLo 667 mg p.o. t.i.d., Coreg 3.125 mg p.o. b.i.d., Aranesp weekly with dialysis.  Colace 100 mg p.o. b.i.d., Neurontin 100 mg p.o. daily  and 300 mg Monday, Wednesday, Friday dialysis.  NovoLog 3 units t.i.d., Lantus insulin 25 units at bedtime, Protonix 40 mg p.o. daily, Velphoro 1500 mg p.o. t.i.d., Robaxin 500 mg p.o. every 6 hours as needed for muscle spasms, oxycodone 5-10 mg p.o.  every 4 hours as needed for pain.    His diet was a diabetic diet.    The patient would follow up with Dr. Delice Lesch at outpatient rehabilitation service office as advised, Dr. Roney Jaffe, renal service - call for appointment.  Dr. [name] -  call for appointment 2 weeks.  Dr. Baltazar Apo, medical management.  SPECIAL INSTRUCTIONS:  Continue hemodialysis as directed.  No smoking.  AN/NUANCE D:06/17/2017 T:06/17/2017 JOB:[id number]

## 2017-06-18 DIAGNOSIS — S88111A Complete traumatic amputation at level between knee and ankle, right lower leg, initial encounter: Secondary | ICD-10-CM | POA: Diagnosis not present

## 2017-06-18 LAB — RENAL FUNCTION PANEL
Albumin: 2 g/dL — ABNORMAL LOW (ref 3.5–5.0)
Anion gap: 13 (ref 5–15)
BUN: 53 mg/dL — ABNORMAL HIGH (ref 6–20)
CO2: 30 mmol/L (ref 22–32)
Calcium: 8.3 mg/dL — ABNORMAL LOW (ref 8.9–10.3)
Chloride: 93 mmol/L — ABNORMAL LOW (ref 101–111)
Creatinine, Ser: 11.04 mg/dL — ABNORMAL HIGH (ref 0.61–1.24)
GFR calc Af Amer: 6 mL/min — ABNORMAL LOW (ref >60)
GFR calc non Af Amer: 5 mL/min — ABNORMAL LOW (ref >60)
Glucose, Bld: 87 mg/dL (ref 65–99)
Phosphorus: 3.6 mg/dL (ref 2.5–4.6)
Potassium: 4 mmol/L (ref 3.5–5.1)
Sodium: 136 mmol/L (ref 135–145)

## 2017-06-18 LAB — CBC
HCT: 27.5 % — ABNORMAL LOW (ref 39.0–52.0)
Hemoglobin: 8.8 g/dL — ABNORMAL LOW (ref 13.0–17.0)
MCH: 32 pg (ref 26.0–34.0)
MCHC: 32 g/dL (ref 30.0–36.0)
MCV: 100 fL (ref 78.0–100.0)
Platelets: 401 K/uL — ABNORMAL HIGH (ref 150–400)
RBC: 2.75 MIL/uL — ABNORMAL LOW (ref 4.22–5.81)
RDW: 14.8 % (ref 11.5–15.5)
WBC: 11.3 10*3/uL — ABNORMAL HIGH (ref 4.0–10.5)

## 2017-06-18 LAB — GLUCOSE, CAPILLARY
GLUCOSE-CAPILLARY: 144 mg/dL — AB (ref 65–99)
Glucose-Capillary: 91 mg/dL (ref 65–99)

## 2017-06-18 MED ORDER — DOCUSATE SODIUM 100 MG PO CAPS: 100.0000 mg | ORAL_CAPSULE | Freq: Two times a day (BID) | ORAL | 0 refills | Status: DC

## 2017-06-18 MED ORDER — INSULIN ASPART 100 UNIT/ML FLEXPEN: 3.0000 [IU] | PEN_INJECTOR | Freq: Three times a day (TID) | SUBCUTANEOUS | 11 refills | Status: DC

## 2017-06-18 MED ORDER — INSULIN GLARGINE 100 UNITS/ML SOLOSTAR PEN: 25.0000 [IU] | PEN_INJECTOR | Freq: Every day | SUBCUTANEOUS | 11 refills | Status: DC

## 2017-06-18 MED ORDER — PENTAFLUOROPROP-TETRAFLUOROETH EX AERO: 1.0000 "application " | INHALATION_SPRAY | CUTANEOUS | Status: DC | PRN

## 2017-06-18 MED ORDER — SUCROFERRIC OXYHYDROXIDE 500 MG PO CHEW: 1500.0000 mg | CHEWABLE_TABLET | Freq: Three times a day (TID) | ORAL | 0 refills | Status: AC

## 2017-06-18 MED ORDER — OXYCODONE HCL 5 MG PO TABS: 5.0000 mg | ORAL_TABLET | ORAL | 0 refills | Status: DC | PRN

## 2017-06-18 MED ORDER — CALCIUM ACETATE (PHOS BINDER) 667 MG PO CAPS: 667.0000 mg | ORAL_CAPSULE | Freq: Three times a day (TID) | ORAL | 10 refills | Status: DC

## 2017-06-18 MED ORDER — PANTOPRAZOLE SODIUM 40 MG PO TBEC: 40.0000 mg | DELAYED_RELEASE_TABLET | Freq: Every day | ORAL | 0 refills | Status: DC

## 2017-06-18 MED ORDER — CALCITRIOL 0.25 MCG PO CAPS: 0.2500 ug | ORAL_CAPSULE | ORAL | 0 refills | Status: AC

## 2017-06-18 MED ORDER — HEPARIN SODIUM (PORCINE) 1000 UNIT/ML DIALYSIS
20.0000 [IU]/kg | INTRAMUSCULAR | Status: DC | PRN
  Administered 2017-06-18: 2000 [IU] via INTRAVENOUS_CENTRAL
  Filled 2017-06-18 (×2): qty 2

## 2017-06-18 MED ORDER — SODIUM CHLORIDE 0.9 % IV SOLN: 100.0000 mL | INTRAVENOUS | Status: DC | PRN

## 2017-06-18 MED ORDER — METHOCARBAMOL 500 MG PO TABS: 500.0000 mg | ORAL_TABLET | Freq: Four times a day (QID) | ORAL | 0 refills | Status: DC | PRN

## 2017-06-18 MED ORDER — GABAPENTIN 300 MG PO CAPS: 300.0000 mg | ORAL_CAPSULE | ORAL | 0 refills | Status: DC

## 2017-06-18 MED ORDER — CALCITRIOL 0.25 MCG PO CAPS
ORAL_CAPSULE | ORAL | Status: AC
  Filled 2017-06-18: qty 1

## 2017-06-18 MED ORDER — CARVEDILOL 3.125 MG PO TABS: 3.1250 mg | ORAL_TABLET | Freq: Two times a day (BID) | ORAL | 1 refills | Status: DC

## 2017-06-18 MED ORDER — ASPIRIN 325 MG PO TBEC: 325.0000 mg | DELAYED_RELEASE_TABLET | Freq: Every day | ORAL | 0 refills | Status: DC

## 2017-06-18 MED ORDER — GABAPENTIN 100 MG PO CAPS: 100.0000 mg | ORAL_CAPSULE | Freq: Every day | ORAL | 1 refills | Status: DC

## 2017-06-18 MED ORDER — DARBEPOETIN ALFA 60 MCG/0.3ML IJ SOSY: 60.0000 ug | PREFILLED_SYRINGE | INTRAMUSCULAR | Status: DC

## 2017-06-18 MED ORDER — LIDOCAINE-PRILOCAINE 2.5-2.5 % EX CREA
1.0000 "application " | TOPICAL_CREAM | CUTANEOUS | Status: DC | PRN
  Filled 2017-06-18: qty 5

## 2017-06-18 MED ORDER — LIDOCAINE HCL (PF) 1 % IJ SOLN: 5.0000 mL | INTRAMUSCULAR | Status: DC | PRN

## 2017-06-18 NOTE — Discharge Instructions (Signed)
Inpatient Rehab Discharge Instructions  Lance [name] Discharge date and time: No discharge date for patient encounter.   Activities/Precautions/ Functional Status: Activity: activity as tolerated Diet: renal diet Wound Care: keep wound clean and dry Functional status:  ___ No restrictions     ___ Walk up steps independently ___ 24/7 supervision/assistance   ___ Walk up steps with assistance ___ Intermittent supervision/assistance  ___ Bathe/dress independently ___ Walk with walker     _x__ Bathe/dress with assistance ___ Walk Independently    ___ Shower independently ___ Walk with assistance    ___ Shower with assistance ___ No alcohol     ___ Return to work/school ________  COMMUNITY REFERRALS UPON DISCHARGE:   Home Health:   PT     OT     RN     Agency:  [facility]:  [phone] Medical Equipment/Items Ordered:  18"x18" wheelchair with right amputee support pad and basic cushion; tub transfer bench; rolling walker  Agency/Supplier:  [facility]        Phone:  [phone]  GENERAL COMMUNITY RESOURCES FOR PATIENT/FAMILY: Support Groups:  [facility] [employer]                              Meets the second Tuesday of every month from 7-8:30 PM                               [facility]                              For information, call [phone]  Special Instructions: Continue hemodialysis as directed  No smoking   My questions have been answered and I understand these instructions. I will adhere to these goals and the provided educational materials after my discharge from the hospital.  Patient/Caregiver Signature _______________________________ Date __________  Clinician Signature _______________________________________ Date __________  Please bring this form and your medication list with you to all your follow-up doctor's appointments.

## 2017-06-18 NOTE — Progress Notes (Signed)
Social Work Discharge Note  The overall goal for the admission was met for:   Discharge location: Yes - home with family  Length of Stay: Yes - 8 days  Discharge activity level: Yes - modified independent w/c level  Home/community participation: Yes  Services provided included: MD, RD, PT, OT, RN, Pharmacy, Neuropsych and SW  Financial Services: Medicare and Private Insurance: [employer]  Follow-up services arranged: Home Health: PT/OT/RN from [facility], DME: 18"x18" w/c with basic cushion and right amputee support pad; rolling walker; tub bench from [place] and Patient/Family has no preference for HH/DME agencies  Comments (or additional information): Pt's mother came for family education and father was often present, too.  Pt is independent to direct his own care.  Gave him signed handicap placard application and other community resources.  Patient/Family verbalized understanding of follow-up arrangements: Yes  Individual responsible for coordination of the follow-up plan: pt and his family  Confirmed correct DME delivered: Trey Sailors 06/18/2017    Jakiyah Stepney, Silvestre Mesi

## 2017-06-18 NOTE — Progress Notes (Signed)
[facility] PHYSICAL MEDICINE & REHABILITATION     PROGRESS NOTE  Subjective/Complaints:  Patient seen lying in bed in HD today. He states he slept well overnight. He is ready for discharge.  ROS: denies CP, SOB, nausea, vomiting, diarrhea.  Objective: Vital Signs: Blood pressure (!) 147/82, pulse 88, temperature 98.8 F (37.1 C), temperature source Oral, resp. rate 18, height 6\' 3"  (1.905 m), weight (P) 101.6 kg (223 lb 15.8 oz), SpO2 99 %. No results found. Recent Labs    06/16/17 1349  WBC 16.2*  HGB 8.7*  HCT 27.3*  PLT 471*   Recent Labs    06/16/17 1349  NA 134*  K 4.9  CL 90*  GLUCOSE 99  BUN 73*  CREATININE 14.38*  CALCIUM 8.3*   CBG (last 3)  Recent Labs    06/17/17 1652 06/17/17 2132 06/18/17 0631  GLUCAP 199* 126* 91    Wt Readings from Last 3 Encounters:  06/17/17 (P) 101.6 kg (223 lb 15.8 oz)  06/09/17 106.2 kg (234 lb 2.1 oz)  05/29/17 108.9 kg (240 lb)    Physical Exam:  BP (!) 147/82 (BP Location: Left Arm)   Pulse 88   Temp 98.8 F (37.1 C) (Oral)   Resp 18   Ht 6\' 3"  (1.905 m)   Wt (P) 101.6 kg (223 lb 15.8 oz)   SpO2 99%   BMI (P) 28.00 kg/m  Constitutional: No distress . Vital signs reviewed. Well-developed. HENT: Normocephalic.  Atraumatic. Eyes: EOMI. No discharge. Cardiovascular: RRR. No JVD. Respiratory: CTA Bilaterally.  Normal effort. GI: BS +. Non-distended. Musc: No edema. +tenderness in right BKA (improving) Neurological: He isalertand oriented  Motor: B/l UE 5/5 prox to distal.  LLE 5/5. RLE: HF 4+/5 (unchanged, pain inhibition) Skin: Right BKA with dressing C/D/I Abrasion on knee from initial fall healing Psychiatric: He has anormal mood and affect. Hisbehavior is normal  Assessment/Plan: 1. Functional deficits secondary to right BKA which require 3+ hours per day of interdisciplinary therapy in a comprehensive inpatient rehab setting. Physiatrist is providing close team supervision and 24 hour management  of active medical problems listed below. Physiatrist and rehab team continue to assess barriers to discharge/monitor patient progress toward functional and medical goals.  Function:  Bathing Bathing position   Position: Shower  Bathing parts Body parts bathed by patient: Right arm, Chest, Left arm, Abdomen, Front perineal area, Buttocks, Right upper leg, Left upper leg, Left lower leg, Back    Bathing assist Assist Level: More than reasonable time      Upper Body Dressing/Undressing Upper body dressing   What is the patient wearing?: Pull over shirt/dress     Pull over shirt/dress - Perfomed by patient: Thread/unthread right sleeve, Thread/unthread left sleeve, Put head through opening, Pull shirt over trunk          Upper body assist Assist Level: No help, No cues      Lower Body Dressing/Undressing Lower body dressing Lower body dressing/undressing activity did not occur: Refused What is the patient wearing?: Underwear, Pants, Socks, Shoes Underwear - Performed by patient: Thread/unthread right underwear leg, Thread/unthread left underwear leg, Pull underwear up/down   Pants- Performed by patient: Thread/unthread right pants leg, Thread/unthread left pants leg, Pull pants up/down       Socks - Performed by patient: Don/doff left sock   Shoes - Performed by patient: Don/doff left shoe            Lower body assist Assist for lower body dressing: More  than reasonable time      Toileting Toileting   Toileting steps completed by patient: Adjust clothing prior to toileting, Performs perineal hygiene, Adjust clothing after toileting Toileting steps completed by helper: Adjust clothing after toileting, Adjust clothing prior to toileting Toileting Assistive Devices: Grab bar or rail  Toileting assist Assist level: Supervision or verbal cues   Transfers Chair/bed transfer   Chair/bed transfer method: Squat pivot Chair/bed transfer assist level: No Help, no cues,  assistive device, takes more than a reasonable amount of time Chair/bed transfer assistive device: Armrests, Orthosis     Locomotion Ambulation     Max distance: 5ft Assist level: Supervision or verbal cues   Wheelchair   Type: Manual Max wheelchair distance: 150 Assist Level: No help, No cues, assistive device, takes more than reasonable amount of time  Cognition Comprehension Comprehension assist level: Follows complex conversation/direction with no assist  Expression Expression assist level: Expresses complex ideas: With no assist  Social Interaction Social Interaction assist level: Interacts appropriately with others - No medications needed.  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   D/c today  Will see patient for transitional care management in 1-2 weeks 2. DVT Prophylaxis/Anticoagulation: SCDs left lower extremity 3. Pain Management/Phantom limb pain:Robaxin and oxycodone as needed  Gabapentin changed to 100 mg daily and 300 mg after HD on 5/2  Encouraged desensitization techniques  Improved 4. Mood:Provide emotional support 5. Neuropsych: This patientiscapable of making decisions on hisown behalf. 6. Skin/Wound Care:Routine skin checks  DCed VAC  7. Fluids/Electrolytes/Nutrition:Routine I/Os  8.End-stage renal disease. Continue hemodialysis as directed -HD MWF after therapy day to allow for participation in therapies 9.Acute on chronic anemia.   Hemoglobin 8.7 on 5/6   Continue to monitor 10.Hypertension. Coreg 3.125 mg twice daily, lisinopril 20 mg daily  Labile with HD on 5/8 11.Diastolic congestive heart failure. Monitor for any signs of fluid overload Filed Weights   06/16/17 1325 06/16/17 1743 06/17/17  2300  Weight: 104.6 kg (230 lb 9.6 oz) 100.6 kg (221 lb 12.5 oz) (P) 101.6 kg (223 lb 15.8 oz)   ? Reliability  Management per nephro 12,.Diabetes mellitus with peripheral neuropathy. Hemoglobin A1c 9.1.   Lantus insulin 35 units nightly, decreased to 25 units on 5/4    NovoLog 3 units 3 times daily, DC'd on 5/8.   Check blood sugars before meals and at bedtime. Diabetic teaching  Labile with hypotension on 5/8  Monitor with increased mobility 13.Tobacco abuse. Counseling 14.Constipation. Laxative assistance 15. Leukocytosis  WBCs 15.2 on 5/6  Labs with HD   Afebrile  Continue to monitor 16. Nausea/vomiting  Resolved  KUB reviewed, unremarkable  LOS (Days) 8 A FACE TO FACE EVALUATION WAS PERFORMED  Lance [name] Lorie Phenix 06/18/2017 8:16 AM

## 2017-06-18 NOTE — Progress Notes (Signed)
Patient discharged to home accompanied by family

## 2017-06-18 NOTE — Progress Notes (Signed)
I was present at this dialysis session. I have reviewed the session itself and made appropriate changes.  He is doing well and tolerating HD.  He was below his edw so UF goal 1 liter, no edema or SOB.  Feels well and anxious to go home.   Filed Weights   06/16/17 1743 06/17/17 2300 06/18/17 0725  Weight: 100.6 kg (221 lb 12.5 oz) 101.6 kg (223 lb 15.8 oz) 99.2 kg (218 lb 11.1 oz)    Recent Labs  Lab 06/18/17 0807  NA 136  K 4.0  CL 93*  CO2 30  GLUCOSE 87  BUN 53*  CREATININE 11.04*  CALCIUM 8.3*  PHOS 3.6    Recent Labs  Lab 06/13/17 1712 06/16/17 1349 06/18/17 0807  WBC 14.6* 16.2* 11.3*  HGB 9.3* 8.7* 8.8*  HCT 28.9* 27.3* 27.5*  MCV 101.0* 101.5* 100.0  PLT 547* 471* 401*    Scheduled Meds: . aspirin EC  325 mg Oral Daily  . calcitRIOL  0.25 mcg Oral Q M,W,F-HD  . calcium acetate  667 mg Oral TID WC  . carvedilol  3.125 mg Oral BID WC  . [START ON 06/23/2017] darbepoetin (ARANESP) injection - DIALYSIS  60 mcg Intravenous Q Mon-HD  . docusate sodium  100 mg Oral BID  . feeding supplement (PRO-STAT SUGAR FREE 64)  30 mL Oral BID  . gabapentin  100 mg Oral Daily  . gabapentin  300 mg Oral Q M,W,F-HD  . hydrocerin   Topical BID  . insulin aspart  0-9 Units Subcutaneous TID WC & HS  . insulin glargine  25 Units Subcutaneous QHS  . pantoprazole  40 mg Oral Daily  . sucroferric oxyhydroxide  1,500 mg Oral TID WC   Continuous Infusions: . sodium chloride    . sodium chloride    . methocarbamol (ROBAXIN)  IV     PRN Meds:.sodium chloride, sodium chloride, acetaminophen, bisacodyl, heparin, hydrocerin, lidocaine (PF), lidocaine-prilocaine, methocarbamol **OR** methocarbamol (ROBAXIN)  IV, ondansetron **OR** ondansetron (ZOFRAN) IV, oxyCODONE, pentafluoroprop-tetrafluoroeth, polyethylene glycol, sorbitol     Dialysis Orders: MWF at [facility] 4h 500/800 105.5kg (new edw is 98kg s/p LBKA and he is below edw prior to HD today, will weight post HD)2k/3calc bath p4 R avf  Hep 10000 units IV -parsabiv 5 mg tiw iv -calcitriol 0.25 ug tiw po  Assessment/Plan: 1.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 DC home 06/18/17 2. ESRD:Continue HD per MWF schedule, nextHD 06/18/17 1st shift. K+ 4.9 3.HTN/volume:BPcontrolled, ACE-I d/c'd. On low dose Coreg only. Lower EDW to 100.5 kg. May need to adjust lower upon DC.  4. Anemia:Hgb 8.7,declining. Increase Aranesp 60 mcg tomorrow with HD HD. 5. Secondary hyperparathyroidism:Good labs, continue Velphoro/Phoslo as binder. 6. Nutrition:Alb low 2.2,Pro-stat. Renal vit.  7. Type 1 DM: On insulin, per primary. Having issues with hypoglycemia this AM.  8.  Disposition: Possible DC 06/18/17.   Donetta Potts,  MD 06/18/2017, 8:46 AM

## 2017-06-20 ENCOUNTER — Telehealth: Payer: Self-pay | Admitting: Registered Nurse

## 2017-06-20 ENCOUNTER — Telehealth: Payer: Self-pay | Admitting: Family Medicine

## 2017-06-20 DIAGNOSIS — N2581 Secondary hyperparathyroidism of renal origin: Secondary | ICD-10-CM | POA: Diagnosis not present

## 2017-06-20 DIAGNOSIS — N186 End stage renal disease: Secondary | ICD-10-CM | POA: Diagnosis not present

## 2017-06-20 NOTE — Telephone Encounter (Signed)
Lance [name] wanted to let Dr. Richardson Landry know that they received the referral for home health services.  They are going to start his home health tomorrow.

## 2017-06-20 NOTE — Telephone Encounter (Signed)
Transitional Care call Transitional Care Call Completed, Appointment Confirmed, Address Confirmed, New Patient Packet Mailed  Patient name: Lance [name]  DOB: 02/04/1971 1. Are you/is patient experiencing any problems since coming home? No a. Are there any questions regarding any aspect of care? No 2. Are there any questions regarding medications administration/dosing? No a. Are meds being taken as prescribed? Yes b. "Patient should review meds with caller to confirm"  Medication List reviewed 3. Have there been any falls? No a. Has Home Health been to the house and/or have they contacted you? Yes, [facility] If not, have you tried to contact them? NA b. Can we help you contact them? NA 4. Are bowels and bladder emptying properly? ESRD: Mon/Wed/ Friday. Bowels moving Properly a. Are there any unexpected incontinence issues? NA b. If applicable, is patient following bowel/bladder programs? NA 5. Any fevers, problems with breathing, unexpected pain? No 6. Are there any skin problems or new areas of breakdown? No 7. Has the patient/family member arranged specialty MD follow up (ie cardiology/neurology/renal/surgical/etc.)?  Mr. Batterton will schedule follow up appointments he states. a. Can we help arrange? NA 8. Does the patient need any other services or support that we can help arrange? No 9. Are caregivers following through as expected in assisting the patient? Yes 10. Has the patient quit smoking, drinking alcohol, or using drugs as recommended? Mr. Gallon denies smoking, drinking alcohol or using illicit drugs.   Appointment date/time 06/26/2017, arrival time 2:00 for 2:20 appointment with Dr. Posey Pronto. At [facility]

## 2017-06-21 DIAGNOSIS — Z992 Dependence on renal dialysis: Secondary | ICD-10-CM | POA: Diagnosis not present

## 2017-06-21 DIAGNOSIS — I132 Hypertensive heart and chronic kidney disease with heart failure and with stage 5 chronic kidney disease, or end stage renal disease: Secondary | ICD-10-CM | POA: Diagnosis not present

## 2017-06-21 DIAGNOSIS — M1711 Unilateral primary osteoarthritis, right knee: Secondary | ICD-10-CM | POA: Diagnosis not present

## 2017-06-21 DIAGNOSIS — F17200 Nicotine dependence, unspecified, uncomplicated: Secondary | ICD-10-CM | POA: Diagnosis not present

## 2017-06-21 DIAGNOSIS — E1022 Type 1 diabetes mellitus with diabetic chronic kidney disease: Secondary | ICD-10-CM | POA: Diagnosis not present

## 2017-06-21 DIAGNOSIS — I5032 Chronic diastolic (congestive) heart failure: Secondary | ICD-10-CM | POA: Diagnosis not present

## 2017-06-21 DIAGNOSIS — Z4781 Encounter for orthopedic aftercare following surgical amputation: Secondary | ICD-10-CM | POA: Diagnosis not present

## 2017-06-21 DIAGNOSIS — D631 Anemia in chronic kidney disease: Secondary | ICD-10-CM | POA: Diagnosis not present

## 2017-06-21 DIAGNOSIS — E1042 Type 1 diabetes mellitus with diabetic polyneuropathy: Secondary | ICD-10-CM | POA: Diagnosis not present

## 2017-06-21 DIAGNOSIS — E1051 Type 1 diabetes mellitus with diabetic peripheral angiopathy without gangrene: Secondary | ICD-10-CM | POA: Diagnosis not present

## 2017-06-21 DIAGNOSIS — M19011 Primary osteoarthritis, right shoulder: Secondary | ICD-10-CM | POA: Diagnosis not present

## 2017-06-21 DIAGNOSIS — N186 End stage renal disease: Secondary | ICD-10-CM | POA: Diagnosis not present

## 2017-06-21 DIAGNOSIS — I471 Supraventricular tachycardia: Secondary | ICD-10-CM | POA: Diagnosis not present

## 2017-06-23 ENCOUNTER — Telehealth (INDEPENDENT_AMBULATORY_CARE_PROVIDER_SITE_OTHER): Payer: Self-pay | Admitting: Orthopedic Surgery

## 2017-06-23 DIAGNOSIS — N2581 Secondary hyperparathyroidism of renal origin: Secondary | ICD-10-CM | POA: Diagnosis not present

## 2017-06-23 DIAGNOSIS — N186 End stage renal disease: Secondary | ICD-10-CM | POA: Diagnosis not present

## 2017-06-23 NOTE — Telephone Encounter (Signed)
Called and sw amanda and advised verbal for orders below as per Dr. Sharol Given.

## 2017-06-23 NOTE — Telephone Encounter (Signed)
Estill Bamberg, PT, from [facility] called to request VO for the following:  2x a week for 6 weeks 2 PRN  CB#[phone].  Thank you

## 2017-06-24 ENCOUNTER — Other Ambulatory Visit: Payer: Self-pay | Admitting: "Endocrinology

## 2017-06-24 DIAGNOSIS — D631 Anemia in chronic kidney disease: Secondary | ICD-10-CM | POA: Diagnosis not present

## 2017-06-24 DIAGNOSIS — Z4781 Encounter for orthopedic aftercare following surgical amputation: Secondary | ICD-10-CM | POA: Diagnosis not present

## 2017-06-24 DIAGNOSIS — E1022 Type 1 diabetes mellitus with diabetic chronic kidney disease: Secondary | ICD-10-CM | POA: Diagnosis not present

## 2017-06-24 DIAGNOSIS — I132 Hypertensive heart and chronic kidney disease with heart failure and with stage 5 chronic kidney disease, or end stage renal disease: Secondary | ICD-10-CM | POA: Diagnosis not present

## 2017-06-24 DIAGNOSIS — Z992 Dependence on renal dialysis: Secondary | ICD-10-CM | POA: Diagnosis not present

## 2017-06-24 DIAGNOSIS — F17200 Nicotine dependence, unspecified, uncomplicated: Secondary | ICD-10-CM | POA: Diagnosis not present

## 2017-06-24 DIAGNOSIS — E1042 Type 1 diabetes mellitus with diabetic polyneuropathy: Secondary | ICD-10-CM | POA: Diagnosis not present

## 2017-06-24 DIAGNOSIS — M1711 Unilateral primary osteoarthritis, right knee: Secondary | ICD-10-CM | POA: Diagnosis not present

## 2017-06-24 DIAGNOSIS — I5032 Chronic diastolic (congestive) heart failure: Secondary | ICD-10-CM | POA: Diagnosis not present

## 2017-06-24 DIAGNOSIS — M19011 Primary osteoarthritis, right shoulder: Secondary | ICD-10-CM | POA: Diagnosis not present

## 2017-06-24 DIAGNOSIS — N186 End stage renal disease: Secondary | ICD-10-CM | POA: Diagnosis not present

## 2017-06-24 DIAGNOSIS — E1051 Type 1 diabetes mellitus with diabetic peripheral angiopathy without gangrene: Secondary | ICD-10-CM | POA: Diagnosis not present

## 2017-06-24 DIAGNOSIS — I471 Supraventricular tachycardia: Secondary | ICD-10-CM | POA: Diagnosis not present

## 2017-06-25 ENCOUNTER — Ambulatory Visit: Payer: BLUE CROSS/BLUE SHIELD | Admitting: "Endocrinology

## 2017-06-25 DIAGNOSIS — N186 End stage renal disease: Secondary | ICD-10-CM | POA: Diagnosis not present

## 2017-06-25 DIAGNOSIS — N2581 Secondary hyperparathyroidism of renal origin: Secondary | ICD-10-CM | POA: Diagnosis not present

## 2017-06-26 ENCOUNTER — Ambulatory Visit (INDEPENDENT_AMBULATORY_CARE_PROVIDER_SITE_OTHER): Payer: BLUE CROSS/BLUE SHIELD | Admitting: Orthopedic Surgery

## 2017-06-26 ENCOUNTER — Telehealth (INDEPENDENT_AMBULATORY_CARE_PROVIDER_SITE_OTHER): Payer: Self-pay | Admitting: Orthopedic Surgery

## 2017-06-26 ENCOUNTER — Encounter: Payer: BLUE CROSS/BLUE SHIELD | Admitting: Physical Medicine & Rehabilitation

## 2017-06-26 ENCOUNTER — Encounter (INDEPENDENT_AMBULATORY_CARE_PROVIDER_SITE_OTHER): Payer: Self-pay | Admitting: Orthopedic Surgery

## 2017-06-26 DIAGNOSIS — I5032 Chronic diastolic (congestive) heart failure: Secondary | ICD-10-CM | POA: Diagnosis not present

## 2017-06-26 DIAGNOSIS — I132 Hypertensive heart and chronic kidney disease with heart failure and with stage 5 chronic kidney disease, or end stage renal disease: Secondary | ICD-10-CM | POA: Diagnosis not present

## 2017-06-26 DIAGNOSIS — D631 Anemia in chronic kidney disease: Secondary | ICD-10-CM | POA: Diagnosis not present

## 2017-06-26 DIAGNOSIS — F17200 Nicotine dependence, unspecified, uncomplicated: Secondary | ICD-10-CM | POA: Diagnosis not present

## 2017-06-26 DIAGNOSIS — M19011 Primary osteoarthritis, right shoulder: Secondary | ICD-10-CM | POA: Diagnosis not present

## 2017-06-26 DIAGNOSIS — Z89511 Acquired absence of right leg below knee: Secondary | ICD-10-CM

## 2017-06-26 DIAGNOSIS — E1051 Type 1 diabetes mellitus with diabetic peripheral angiopathy without gangrene: Secondary | ICD-10-CM | POA: Diagnosis not present

## 2017-06-26 DIAGNOSIS — Z4781 Encounter for orthopedic aftercare following surgical amputation: Secondary | ICD-10-CM | POA: Diagnosis not present

## 2017-06-26 DIAGNOSIS — N186 End stage renal disease: Secondary | ICD-10-CM | POA: Diagnosis not present

## 2017-06-26 DIAGNOSIS — Z992 Dependence on renal dialysis: Secondary | ICD-10-CM | POA: Diagnosis not present

## 2017-06-26 DIAGNOSIS — I471 Supraventricular tachycardia: Secondary | ICD-10-CM | POA: Diagnosis not present

## 2017-06-26 DIAGNOSIS — M1711 Unilateral primary osteoarthritis, right knee: Secondary | ICD-10-CM | POA: Diagnosis not present

## 2017-06-26 DIAGNOSIS — E1022 Type 1 diabetes mellitus with diabetic chronic kidney disease: Secondary | ICD-10-CM | POA: Diagnosis not present

## 2017-06-26 DIAGNOSIS — E1042 Type 1 diabetes mellitus with diabetic polyneuropathy: Secondary | ICD-10-CM | POA: Diagnosis not present

## 2017-06-26 MED ORDER — PENTOXIFYLLINE ER 400 MG PO TBCR: 400.0000 mg | EXTENDED_RELEASE_TABLET | Freq: Three times a day (TID) | ORAL | 3 refills | Status: DC

## 2017-06-26 MED ORDER — OXYCODONE-ACETAMINOPHEN 5-325 MG PO TABS: 1.0000 | ORAL_TABLET | Freq: Three times a day (TID) | ORAL | 0 refills | Status: DC | PRN

## 2017-06-26 NOTE — Progress Notes (Signed)
Office Visit Note   Patient: Lance [name]           Date of Birth: 1970/12/12           MRN: [id number] Visit Date: 06/26/2017              Requested by: [name], [address] [place], [place] [address] PCP: [name], MD  Chief Complaint  Patient presents with  . Right Leg - Follow-up, Routine Post Op      HPI: Patient presents about 3 weeks status post right knee amputation.  Patient was not placed in the compression shrinker while he was at skilled nursing he has been sitting with his leg dependent he has increased ischemic ulceration and swelling.  Assessment & Plan: Visit Diagnoses:  1. Acquired absence of right leg below knee (HCC)     Plan: Discussed the importance of protein supplement he will get some weight powder.  Recommended elevation recommend wearing the compression stocking.  Harvest the staples at follow-up.  Follow-Up Instructions: Return in about 1 week (around 07/03/2017).   Ortho Exam  Patient is alert, oriented, no adenopathy, well-dressed, normal affect, normal respiratory effort. Examination patient has some thin ischemic changes with black eschar around the surgical incision there is a small amount of clear serosanguineous drainage.  Patient does have swelling he has not been elevating his leg he has not been wearing compression.  Patient has severe protein caloric malnutrition.  Imaging: No results found. No images are attached to the encounter.  Labs: Lab Results  Component Value Date   HGBA1C 9.1 (H) 02/18/2017   HGBA1C 9.8 (H) 11/28/2015   HGBA1C 9.7 (H) 12/26/2013   ESRSEDRATE 120 (H) 11/13/2015   CRP 35.4 (H) 11/13/2015   REPTSTATUS 12/06/2015 FINAL 11/30/2015   REPTSTATUS 11/30/2015 FINAL 11/30/2015   GRAMSTAIN  11/30/2015    WBC PRESENT,BOTH PMN AND MONONUCLEAR NO ORGANISMS SEEN CYTOSPIN SMEAR    CULT NO GROWTH 5 DAYS 11/30/2015   LABORGA STREPTOCOCCUS PNEUMONIAE 11/12/2015     Lab  Results  Component Value Date   ALBUMIN 2.0 (L) 06/18/2017   ALBUMIN 2.2 (L) 06/16/2017   ALBUMIN 2.3 (L) 06/13/2017    There is no height or weight on file to calculate BMI.  Orders:  No orders of the defined types were placed in this encounter.  No orders of the defined types were placed in this encounter.    Procedures: No procedures performed  Clinical Data: No additional findings.  ROS:  All other systems negative, except as noted in the HPI. Review of Systems  Objective: Vital Signs: There were no vitals taken for this visit.  Specialty Comments:  No specialty comments available.  PMFS History: Patient Active Problem List   Diagnosis Date Noted  . Benign essential HTN   . Chronic diastolic (congestive) heart failure ([place])   . Labile blood glucose   . Labile blood pressure   . Vomiting   . Phantom limb pain ([place])   . Poorly controlled type 2 diabetes mellitus with peripheral neuropathy ([place])   . Essential hypertension   . Unilateral complete BKA, right, sequela ([place])   . Amputation of right lower extremity below knee ([place]) 06/10/2017  . Unilateral complete BKA, right, initial encounter ([place])   . ESRD on dialysis ([place])   . Chronic diastolic congestive heart failure ([place])   . History of PSVT (paroxysmal supraventricular tachycardia)   . Diabetes mellitus type 2 in nonobese (  HCC)   . Post-operative pain   . Acute blood loss anemia   . Anemia of chronic disease   . Leukocytosis   . Tachycardia   . History of right below knee amputation ([place]) 06/06/2017  . Gangrene of right foot ([place])   . PVD (peripheral vascular disease) ([place]) 05/13/2017  . Bilateral pleural effusion 11/28/2015  . Loculated pleural effusion 11/28/2015  . HCAP (healthcare-associated pneumonia) 11/28/2015  . ESRD (end stage renal disease) on dialysis ([place]) 11/12/2015  . Hyperkalemia 11/12/2015  . Shoulder pain, acute 11/12/2015  . Nausea & vomiting 11/12/2015  . Diarrhea 11/12/2015  .  Hypertensive urgency 05/05/2014  . Nephrotic syndrome 12/29/2013  . Hyponatremia 12/28/2013  . Acute diastolic heart failure ([place]) 12/28/2013  . Hypoglycemia 12/28/2013  . Dyspnea   . Acute renal failure syndrome ([place])   . Diabetic ketoacidosis without coma associated with type 1 diabetes mellitus ([place])   . Demand ischemia ([place]) 12/24/2013  . DKA (diabetic ketoacidoses) ([place]) 12/23/2013  . CAP (community acquired pneumonia) 12/23/2013  . Sepsis due to pneumonia ([place]) 12/23/2013  . Metabolic acidemia 34/19/6222  . Acute renal failure ([place]) 12/23/2013  . Diabetic ketoacidosis ([place]) 12/23/2013  . Chronic diastolic heart failure ([place]) 04/14/2013  . PSVT (paroxysmal supraventricular tachycardia) ([place]) 04/14/2013  . Bilateral leg edema 03/26/2013  . Chronic kidney disease, stage III (moderate) ([place]) 09/29/2011  . 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 Essential hypertension, benign 09/26/2010  . Hyperlipidemia 09/26/2010   Past Medical History:  Diagnosis Date  . Aortic stenosis    Very mild in 05/2007  . Arthritis    "right shoulder; right knee; feel like I've got it all over" (11/28/2015)  . CHF (congestive heart failure) ([place])   . Degenerative joint disease    Left TKA  . Diastolic heart failure (HCC)    LVEF 65-70% with grade 2 diastolic dysfunction  . ESRD (end stage renal disease) on dialysis ([place])    "MWF; [facility] on [address]" (11/28/2015)  . Essential hypertension, benign 2000   LVH  . GERD (gastroesophageal reflux disease)   . HCAP (healthcare-associated pneumonia) 11/28/2015  . Hyperlipidemia 2000  . Loculated pleural effusion 11/28/2015   [name] 11/28/2015  . Pneumonia 12/2013; 05/2014  . PSVT (paroxysmal supraventricular tachycardia) (HCC)    Nonsustained; asymptomatic; diagnosed by event recorder in 2006  . Tobacco abuse, in remission    20 pack years; discontinued in 1985; bronchitic changes on chest x-ray and 2009  . Type 1  diabetes mellitus ([place]) 1990    Family History  Problem Relation Age of Onset  . Diabetes Father   . Hypertension Father   . Diabetes Mother   . Hypertension Mother     Past Surgical History:  Procedure Laterality Date  . AMPUTATION Right 06/06/2017   Procedure: RIGHT BELOW KNEE AMPUTATION;  Surgeon: [name], MD;  Location: [facility];  Service: Orthopedics;  Laterality: Right;  . AV FISTULA PLACEMENT Right 06/23/2014   Procedure: ARTERIOVENOUS (AV) FISTULA CREATION;  Surgeon: Angelia Mould, MD;  Location: [facility];  Service: Vascular;  Laterality: Right;  . CARDIAC CATHETERIZATION     [facility]- evaluation for Kidney/ pancreas transplant  . EYE SURGERY Bilateral    "for glaucoma"  . INGUINAL HERNIA REPAIR Right    As a child  . LOWER EXTREMITY ANGIOGRAPHY N/A 05/27/2017   Procedure: LOWER EXTREMITY ANGIOGRAPHY;  Surgeon: [name], MD;  Location: [facility];  Service: Cardiovascular;  Laterality: N/A;  . WISDOM TOOTH EXTRACTION  1987   Social History   Occupational History  . Not on file  Tobacco Use  . Smoking status: Never Smoker  . Smokeless tobacco: Never Used  Substance and Sexual Activity  . Alcohol use: Yes    Comment: 11/28/2015 "used to drink; stopped ~ 2 yr ago"  . Drug use: No  . Sexual activity: Yes

## 2017-06-26 NOTE — Telephone Encounter (Signed)
Flor, PT, with [facility] called stating that she saw the patient for eval.  She did not see any limitations for the patient as far a transfers in and out of the bed, etc.  Lance [name] also states that he is doing his exercises and is doing very well.  She would like a call back if there are any concerns and to confirm that you received her message.  CB#[phone].  Thank you.

## 2017-06-26 NOTE — Telephone Encounter (Signed)
Called and sw Flor and she states that the pt is independent on his own in his ome and demonstrated the ability to do all activities safely. She states that they will place his HHPT on hold until he requires for prosthetic gait training.

## 2017-06-26 NOTE — Telephone Encounter (Signed)
[facility]  [phone]    Clarification on wound care orders

## 2017-06-26 NOTE — Addendum Note (Signed)
Addended by: [name] on: 06/26/2017 08:42 AM   Modules accepted: Orders

## 2017-06-27 DIAGNOSIS — N186 End stage renal disease: Secondary | ICD-10-CM | POA: Diagnosis not present

## 2017-06-27 DIAGNOSIS — N2581 Secondary hyperparathyroidism of renal origin: Secondary | ICD-10-CM | POA: Diagnosis not present

## 2017-06-27 NOTE — Telephone Encounter (Signed)
Called and lm on vm to advise that the pt was in the office yesterday and is to wear his shrinker. No wound care orders and he will follow up next week for staple removal. To call with questions.

## 2017-06-30 ENCOUNTER — Telehealth (INDEPENDENT_AMBULATORY_CARE_PROVIDER_SITE_OTHER): Payer: Self-pay | Admitting: Orthopedic Surgery

## 2017-06-30 DIAGNOSIS — N186 End stage renal disease: Secondary | ICD-10-CM | POA: Diagnosis not present

## 2017-06-30 DIAGNOSIS — N2581 Secondary hyperparathyroidism of renal origin: Secondary | ICD-10-CM | POA: Diagnosis not present

## 2017-06-30 NOTE — Telephone Encounter (Signed)
Patient recently had surgery with Dr. Sharol Given for an amputation and he is needing another RX for some sleeves for his stump to be sent over to [facility] by 3 today so he can go pick those up on his way home from Dialysis. CB # [phone]

## 2017-07-01 ENCOUNTER — Ambulatory Visit (INDEPENDENT_AMBULATORY_CARE_PROVIDER_SITE_OTHER): Payer: BLUE CROSS/BLUE SHIELD | Admitting: Family Medicine

## 2017-07-01 ENCOUNTER — Ambulatory Visit (INDEPENDENT_AMBULATORY_CARE_PROVIDER_SITE_OTHER): Payer: BLUE CROSS/BLUE SHIELD | Admitting: "Endocrinology

## 2017-07-01 ENCOUNTER — Encounter: Payer: Self-pay | Admitting: Family Medicine

## 2017-07-01 ENCOUNTER — Encounter: Payer: Self-pay | Admitting: "Endocrinology

## 2017-07-01 VITALS — BP 148/92 | Ht 75.0 in

## 2017-07-01 VITALS — BP 154/74 | HR 97 | Ht 75.0 in

## 2017-07-01 DIAGNOSIS — Z89511 Acquired absence of right leg below knee: Secondary | ICD-10-CM | POA: Diagnosis not present

## 2017-07-01 DIAGNOSIS — Z125 Encounter for screening for malignant neoplasm of prostate: Secondary | ICD-10-CM | POA: Diagnosis not present

## 2017-07-01 DIAGNOSIS — F17200 Nicotine dependence, unspecified, uncomplicated: Secondary | ICD-10-CM | POA: Diagnosis not present

## 2017-07-01 DIAGNOSIS — E1051 Type 1 diabetes mellitus with diabetic peripheral angiopathy without gangrene: Secondary | ICD-10-CM | POA: Diagnosis not present

## 2017-07-01 DIAGNOSIS — E1022 Type 1 diabetes mellitus with diabetic chronic kidney disease: Secondary | ICD-10-CM

## 2017-07-01 DIAGNOSIS — I70243 Atherosclerosis of native arteries of left leg with ulceration of ankle: Secondary | ICD-10-CM

## 2017-07-01 DIAGNOSIS — Z13220 Encounter for screening for lipoid disorders: Secondary | ICD-10-CM

## 2017-07-01 DIAGNOSIS — M1711 Unilateral primary osteoarthritis, right knee: Secondary | ICD-10-CM | POA: Diagnosis not present

## 2017-07-01 DIAGNOSIS — I132 Hypertensive heart and chronic kidney disease with heart failure and with stage 5 chronic kidney disease, or end stage renal disease: Secondary | ICD-10-CM | POA: Diagnosis not present

## 2017-07-01 DIAGNOSIS — I471 Supraventricular tachycardia: Secondary | ICD-10-CM | POA: Diagnosis not present

## 2017-07-01 DIAGNOSIS — I739 Peripheral vascular disease, unspecified: Secondary | ICD-10-CM | POA: Diagnosis not present

## 2017-07-01 DIAGNOSIS — N186 End stage renal disease: Secondary | ICD-10-CM

## 2017-07-01 DIAGNOSIS — Z992 Dependence on renal dialysis: Secondary | ICD-10-CM | POA: Diagnosis not present

## 2017-07-01 DIAGNOSIS — I5032 Chronic diastolic (congestive) heart failure: Secondary | ICD-10-CM | POA: Diagnosis not present

## 2017-07-01 DIAGNOSIS — I10 Essential (primary) hypertension: Secondary | ICD-10-CM | POA: Diagnosis not present

## 2017-07-01 DIAGNOSIS — Z4781 Encounter for orthopedic aftercare following surgical amputation: Secondary | ICD-10-CM | POA: Diagnosis not present

## 2017-07-01 DIAGNOSIS — D631 Anemia in chronic kidney disease: Secondary | ICD-10-CM | POA: Diagnosis not present

## 2017-07-01 DIAGNOSIS — M19011 Primary osteoarthritis, right shoulder: Secondary | ICD-10-CM | POA: Diagnosis not present

## 2017-07-01 DIAGNOSIS — E1042 Type 1 diabetes mellitus with diabetic polyneuropathy: Secondary | ICD-10-CM | POA: Diagnosis not present

## 2017-07-01 MED ORDER — INSULIN GLARGINE 100 UNITS/ML SOLOSTAR PEN: 25.0000 [IU] | PEN_INJECTOR | Freq: Every day | SUBCUTANEOUS | 11 refills | Status: DC

## 2017-07-01 MED ORDER — ONDANSETRON 4 MG PO TBDP: 4.0000 mg | ORAL_TABLET | Freq: Three times a day (TID) | ORAL | 5 refills | Status: DC | PRN

## 2017-07-01 NOTE — Patient Instructions (Signed)

## 2017-07-01 NOTE — Progress Notes (Signed)
   Subjective:    Patient ID: Lance [name], male    DOB: [birth year], 47 y.o.   MRN: [id number]  HPI Patient arrives for a follow up on a recent hospitalization for right leg amputation.  Doing well since being released from hospital.   eeling some nausea wonders if related to te trental  Complete hospital record and discharge summary reviewed in presence of patient and family.  Patient status post right leg below the knee amputation.  Developed a gangrene S infection of the foot which could not be successfully treated.  Vascular surgeons assessed patient and had severe compromise of arterial blood flow.  Unfortunately it was not amenable to revascularization.  Patient notes some pain though overall has improved.  Using oxycodone as needed for pain.  Also notes nausea and queasiness intermittently.  Wonders if it may be related to Trileptal that he is now on 4 wound healing purposes.  Past years worth of assessments reviewed and patient has not had a lipid profile and PSA this was discussed      Review of Systems No headache, no major weight loss or weight gain, no chest pain no back pain abdominal pain no change in bowel habits complete ROS otherwise negative     Objective:   Physical Exam   Alert and oriented, vitals reviewed and stable, NAD ENT-TM's and ext canals WNL bilat via otoscopic exam Soft palate, tonsils and post pharynx WNL via oropharyngeal exam Neck-symmetric, no masses; thyroid nonpalpable and nontender Pulmonary-no tachypnea or accessory muscle use; Clear without wheezes via auscultation Card--no abnrml murmurs, rhythm reg and rate WNL Carotid pulses symmetric, without bruits Right below the knee amputation.  Wound not assessed as dressing just applied.     Assessment & Plan:  #1 impression status post right below-knee amputation.  Patient working closely with physical therapy and with specialist regarding future artificial limb.  Patient is not  decided that Zofran as needed.  Pain control adequate with current medications.  Will add appropriate blood work.  Patient followed by all his specialists including his diabetes specialist/follow-up.  Recommend  Greater than 50% of this 25 minute face to face visit was spent in counseling and discussion and coordination of care regarding the above diagnosis/diagnosies

## 2017-07-01 NOTE — Progress Notes (Signed)
Endocrinology follow-up note       07/01/2017, 1:20 PM   Subjective:    Patient ID: Lance [name], male    DOB: 1970/09/01.  Lance [name] is being seen in follow-up  for management of currently uncontrolled symptomatic diabetes requested by  [name], MD.   Past Medical History:  Diagnosis Date  . Aortic stenosis    Very mild in 05/2007  . Arthritis    "right shoulder; right knee; feel like I've got it all over" (11/28/2015)  . CHF (congestive heart failure) ([place])   . Degenerative joint disease    Left TKA  . Diastolic heart failure (HCC)    LVEF 65-70% with grade 2 diastolic dysfunction  . ESRD (end stage renal disease) on dialysis ([place])    "MWF; [facility] [address]" (11/28/2015)  . Essential hypertension, benign 2000   LVH  . GERD (gastroesophageal reflux disease)   . HCAP (healthcare-associated pneumonia) 11/28/2015  . Hyperlipidemia 2000  . Loculated pleural effusion 11/28/2015   [name] 11/28/2015  . Pneumonia 12/2013; 05/2014  . PSVT (paroxysmal supraventricular tachycardia) (HCC)    Nonsustained; asymptomatic; diagnosed by event recorder in 2006  . Tobacco abuse, in remission    20 pack years; discontinued in 1985; bronchitic changes on chest x-ray and 2009  . Type 1 diabetes mellitus ([place]) 1990   Past Surgical History:  Procedure Laterality Date  . AMPUTATION Right 06/06/2017   Procedure: RIGHT BELOW KNEE AMPUTATION;  Surgeon: [name], MD;  Location: [facility];  Service: Orthopedics;  Laterality: Right;  . AV FISTULA PLACEMENT Right 06/23/2014   Procedure: ARTERIOVENOUS (AV) FISTULA CREATION;  Surgeon: Angelia Mould, MD;  Location: [facility];  Service: Vascular;  Laterality: Right;  . CARDIAC CATHETERIZATION     [facility]- evaluation for Kidney/ pancreas transplant  . EYE SURGERY Bilateral    "for glaucoma"  . INGUINAL HERNIA REPAIR Right    As a child  . LOWER  EXTREMITY ANGIOGRAPHY N/A 05/27/2017   Procedure: LOWER EXTREMITY ANGIOGRAPHY;  Surgeon: [name], MD;  Location: [facility];  Service: Cardiovascular;  Laterality: N/A;  . WISDOM TOOTH EXTRACTION  1987   Social History   Socioeconomic History  . Marital status: Married    Spouse name: Not on file  . Number of children: Not on file  . Years of education: Not on file  . Highest education level: Not on file  Occupational History  . Not on file  Social Needs  . Financial resource strain: Not on file  . Food insecurity:    Worry: Not on file    Inability: Not on file  . Transportation needs:    Medical: Not on file    Non-medical: Not on file  Tobacco Use  . Smoking status: Never Smoker  . Smokeless tobacco: Never Used  Substance and Sexual Activity  . Alcohol use: Yes    Comment: 11/28/2015 "used to drink; stopped ~ 2 yr ago"  . Drug use: No  . Sexual activity: Yes  Lifestyle  . Physical activity:    Days per week: Not on file  Minutes per session: Not on file  . Stress: Not on file  Relationships  . Social connections:    Talks on phone: Not on file    Gets together: Not on file    Attends religious service: Not on file    Active member of club or organization: Not on file    Attends meetings of clubs or organizations: Not on file    Relationship status: Not on file  Other Topics Concern  . Not on file  Social History Narrative  . Not on file   Outpatient Encounter Medications as of 07/01/2017  Medication Sig  . Insulin Lispro (HUMALOG KWIKPEN [place]) Inject 15-21 Units into the skin 3 (three) times daily with meals.  Marland Kitchen aspirin EC 325 MG EC tablet Take 1 tablet (325 mg total) by mouth daily.  . calcitRIOL (ROCALTROL) 0.25 MCG capsule Take 1 capsule (0.25 mcg total) by mouth every Monday, Wednesday, and Friday with hemodialysis.  Marland Kitchen calcium acetate (PHOSLO) 667 MG capsule Take 1 capsule (667 mg total) by mouth 3 (three) times daily.  . carvedilol (COREG)  3.125 MG tablet Take 1 tablet (3.125 mg total) by mouth 2 (two) times daily with a meal.  . Continuous Blood Gluc Sensor (FREESTYLE LIBRE SENSOR SYSTEM) MISC USE 1 SENSOR EVERY 10 DAYS  . Darbepoetin Alfa (ARANESP) 60 MCG/0.3ML SOSY injection Inject 0.3 mLs (60 mcg total) into the vein every Monday with hemodialysis.  Marland Kitchen docusate sodium (COLACE) 100 MG capsule Take 1 capsule (100 mg total) by mouth 2 (two) times daily.  Marland Kitchen gabapentin (NEURONTIN) 100 MG capsule Take 1 capsule (100 mg total) by mouth daily.  Marland Kitchen gabapentin (NEURONTIN) 300 MG capsule Take 1 capsule (300 mg total) by mouth every Monday, Wednesday, and Friday with hemodialysis.  Marland Kitchen insulin glargine (LANTUS) 100 unit/mL SOPN Inject 0.25 mLs (25 Units total) into the skin at bedtime.  . methocarbamol (ROBAXIN) 500 MG tablet Take 1 tablet (500 mg total) by mouth every 6 (six) hours as needed for muscle spasms.  Marland Kitchen oxyCODONE (OXY IR/ROXICODONE) 5 MG immediate release tablet Take 1-2 tablets (5-10 mg total) by mouth every 4 (four) hours as needed for moderate pain (pain score 4-6).  Marland Kitchen oxyCODONE-acetaminophen (PERCOCET/ROXICET) 5-325 MG tablet Take 1 tablet by mouth every 8 (eight) hours as needed.  . pantoprazole (PROTONIX) 40 MG tablet Take 1 tablet (40 mg total) by mouth daily.  . pentoxifylline (TRENTAL) 400 MG CR tablet Take 1 tablet (400 mg total) by mouth 3 (three) times daily with meals.  . sucroferric oxyhydroxide (VELPHORO) 500 MG chewable tablet Chew 3 tablets (1,500 mg total) by mouth 3 (three) times daily.  . [DISCONTINUED] insulin aspart (NOVOLOG) 100 UNIT/ML FlexPen Inject 3 Units into the skin 3 (three) times daily with meals.  . [DISCONTINUED] insulin glargine (LANTUS) 100 unit/mL SOPN Inject 0.25 mLs (25 Units total) into the skin at bedtime.   No facility-administered encounter medications on file as of 07/01/2017.     ALLERGIES: Allergies  Allergen Reactions  . Atorvastatin Other (See Comments)    Myalgias   . Minoxidil  Other (See Comments)    Fluid retention     VACCINATION STATUS: Immunization History  Administered Date(s) Administered  . Influenza,inj,Quad PF,6+ Mos 05/06/2014  . Pneumococcal Polysaccharide-23 05/06/2014    Diabetes  He presents for his follow-up diabetic visit. He has type 1 diabetes mellitus. Onset time: He was diagnosed at approximate age of 13 years with type 1 diabetes. His disease course has been fluctuating. There  are no hypoglycemic associated symptoms. Pertinent negatives for hypoglycemia include no confusion, headaches, pallor or seizures. Pertinent negatives for diabetes include no chest pain, no fatigue, no polydipsia, no polyphagia, no polyuria and no weakness. There are no hypoglycemic complications. Symptoms are worsening. Diabetic complications include nephropathy and PVD. (His diabetes is complicated by end-stage renal disease on  hemodialysis for the last 5 years.  Since last visit, he underwent right below-knee amputation due to peripheral arterial disease.  Patient is  on  Kidney transplant list.) Risk factors for coronary artery disease include diabetes mellitus, hypertension and sedentary lifestyle. Current diabetic treatment includes insulin injections. His weight is decreasing steadily (Status post right BKA.). He is following a generally unhealthy diet. He has had a previous visit with a dietitian. He never participates in exercise. His home blood glucose trend is fluctuating dramatically. His breakfast blood glucose range is generally 110-130 mg/dl. His lunch blood glucose range is generally 180-200 mg/dl. His dinner blood glucose range is generally 180-200 mg/dl. His bedtime blood glucose range is generally 180-200 mg/dl. His overall blood glucose range is 180-200 mg/dl. ( his  electronic medical records show A1c's of 9.8%, 9.7%, and 11.2% between 2015 - 2017.) An ACE inhibitor/angiotensin II receptor blocker is being taken. Eye exam is current.  Hypertension  This is a  chronic problem. The current episode started more than 1 year ago. The problem is controlled. Pertinent negatives include no chest pain, headaches, neck pain, palpitations or shortness of breath. Risk factors for coronary artery disease include diabetes mellitus and sedentary lifestyle. Past treatments include ACE inhibitors. Hypertensive end-organ damage includes kidney disease and PVD.     Review of Systems  Constitutional: Negative for chills, fatigue, fever and unexpected weight change.  HENT: Negative for dental problem, mouth sores and trouble swallowing.   Eyes: Negative for visual disturbance.  Respiratory: Negative for cough, choking, chest tightness, shortness of breath and wheezing.   Cardiovascular: Negative for chest pain, palpitations and leg swelling.  Gastrointestinal: Negative for abdominal distention, abdominal pain, constipation, diarrhea, nausea and vomiting.  Endocrine: Negative for polydipsia, polyphagia and polyuria.  Genitourinary: Negative for dysuria, flank pain, hematuria and urgency.  Musculoskeletal: Negative for back pain, gait problem, myalgias and neck pain.  Skin: Negative for pallor, rash and wound.  Neurological: Negative for seizures, syncope, weakness, numbness and headaches.  Psychiatric/Behavioral: Negative for confusion and dysphoric mood.    Objective:    BP (!) 154/74   Pulse 97   Ht 6\' 3"  (1.905 m)   BMI 27.06 kg/m   Wt Readings from Last 3 Encounters:  06/18/17 216 lb 7.9 oz (98.2 kg)  06/09/17 234 lb 2.1 oz (106.2 kg)  05/29/17 240 lb (108.9 kg)     Physical Exam  Constitutional: He is oriented to person, place, and time. He appears well-developed. He is cooperative. No distress.  HENT:  Head: Normocephalic and atraumatic.  Eyes: EOM are normal.  Neck: Normal range of motion. Neck supple. No tracheal deviation present. No thyromegaly present.  Cardiovascular: Normal rate, S1 normal, S2 normal and normal heart sounds. Exam reveals no  gallop.  No murmur heard. Pulses:      Dorsalis pedis pulses are 1+ on the right side, and 1+ on the left side.       Posterior tibial pulses are 1+ on the right side, and 1+ on the left side.  Pulmonary/Chest: Breath sounds normal. No respiratory distress. He has no wheezes.  Abdominal: Soft. Bowel sounds are normal. He exhibits  no distension. There is no tenderness. There is no guarding and no CVA tenderness.  Musculoskeletal: He exhibits no edema.       Right shoulder: He exhibits no swelling and no deformity.  Neurological: He is alert and oriented to person, place, and time. He has normal strength and normal reflexes. No cranial nerve deficit or sensory deficit. Gait normal.  Skin: Skin is warm and dry. No rash noted. No cyanosis. Nails show no clubbing.  Psychiatric: He has a normal mood and affect. His speech is normal and behavior is normal. Cognition and memory are normal.   CMP ( most recent) CMP     Component Value Date/Time   NA 136 06/18/2017 0807   NA 131 (L) 02/18/2017 1438   K 4.0 06/18/2017 0807   CL 93 (L) 06/18/2017 0807   CO2 30 06/18/2017 0807   GLUCOSE 87 06/18/2017 0807   BUN 53 (H) 06/18/2017 0807   BUN 46 (H) 02/18/2017 1438   CREATININE 11.04 (H) 06/18/2017 0807   CREATININE 2.21 (H) 03/26/2013 1520   CALCIUM 8.3 (L) 06/18/2017 0807   PROT 8.3 02/18/2017 1438   ALBUMIN 2.0 (L) 06/18/2017 0807   ALBUMIN 4.4 02/18/2017 1438   AST 15 02/18/2017 1438   ALT 24 02/18/2017 1438   ALKPHOS 161 (H) 02/18/2017 1438   BILITOT 0.5 02/18/2017 1438   GFRNONAA 5 (L) 06/18/2017 0807   GFRAA 6 (L) 06/18/2017 0807     Diabetic Labs (most recent): Lab Results  Component Value Date   HGBA1C 9.1 (H) 02/18/2017   HGBA1C 9.8 (H) 11/28/2015   HGBA1C 9.7 (H) 12/26/2013     Lipid Panel ( most recent) Lipid Panel     Component Value Date/Time   CHOL 174 12/28/2013 0707   TRIG 229 (H) 12/28/2013 0707   HDL 39 (L) 12/28/2013 0707   CHOLHDL 4.5 12/28/2013 0707    VLDL 46 (H) 12/28/2013 0707   LDLCALC 89 12/28/2013 0707      Lab Results  Component Value Date   TSH 1.390 02/18/2017   TSH 1.760 12/26/2013   FREET4 1.00 02/18/2017     Assessment & Plan:   1. Type 1 diabetes mellitus with end-stage renal disease ([place])  - Lance [name] has currently uncontrolled symptomatic type 1 DM since  47 years of age. -He does not have recent A1c to review.  His diabetes course is getting more complicated,   most recent documented A1c of 9.6%. -In the interim, he underwent a right BKA. -His glycemic profile still dramatically fluctuating. -His CGM analysis reveals 35% time range, 59% above range, and 6% hypoglycemia.   -his diabetes is complicated by end-stage renal disease on hemodialysis/kidney transplant list, PAD, sedentary life and SERJIO DEUPREE [name] remains at a high risk for more acute and chronic complications which include CAD, CVA, retinopathy, and neuropathy. These are all discussed in detail with the patient.  - I have counseled him on diet management  by adopting a carbohydrate restricted/protein rich diet.  -  Suggestion is made for him to avoid simple carbohydrates  from his diet including Cakes, Sweet Desserts / Pastries, Ice Cream, Soda (diet and regular), Sweet Tea, Candies, Chips, Cookies, Store Bought Juices, Alcohol in Excess of  1-2 drinks a day, Artificial Sweeteners, and "Sugar-free" Products. This will help patient to have stable blood glucose profile and potentially avoid unintended weight gain.  - I encouraged him to switch to  unprocessed or minimally processed complex starch and increased  protein intake (animal or plant source), fruits, and vegetables.  - he is advised to stick to a routine mealtimes to eat 3 meals  a day and avoid unnecessary snacks ( to snack only to correct hypoglycemia).   - he has been scheduled with Jearld Fenton, RDN, CDE for individualized diabetes education- consult pending.  - I have  approached him with the following individualized plan to manage diabetes and patient agrees:   - Given his chronic glycemic burden , he will continue to require intensive treatment with basal/bolus insulin.   - Patient reports history of uncontrolled diabetes and hypertension as a cause of ESRD.  -Due to fasting hypoglycemia, I lowered his Lantus to 25 units nightly, and due to postprandial hyperglycemia I approach him to increase his Humalog to 15  units 3 times a day with meals  for pre-meal BG readings of 90-150mg /dl, plus patient specific correction dose for unexpected hyperglycemia above 150mg /dl, associated with strict documenting of  glucose 4 times a day-before meals and at bedtime. - Patient is warned not to take insulin without proper monitoring per orders. -Adjustment parameters are given for hypo and hyperglycemia in writing. -Patient is encouraged to call clinic for blood glucose levels less than 70 or above 300 mg /dl.  - Insulin is the only choice of therapy for him. He is benefiting from CGM, I advised him to wear it at all times.   - Patient specific target  A1c;  LDL, HDL, Triglycerides, and  Waist Circumference were discussed in detail.  2) BP/HTN:  His blood pressure is controlled to target.  I advised him to continue his current blood pressure medications including ACEI/ARB.  3) Lipids/HPL: Recent  Lipid panel   shows LDL at 89. He he is marked as allergic to statins.   4)  Weight/Diet: CDE Consult has been  initiated , exercise, and detailed carbohydrates information provided.  5) Chronic Care/Health Maintenance:  -he  is on ACEI medications and  is encouraged to continue to follow up with Ophthalmology, Dentist, nephrology,  Podiatrist at least yearly or according to recommendations, and advised to  stay away from smoking. I have recommended yearly flu vaccine and pneumonia vaccination at least every 5 years; moderate intensity exercise for up to 150 minutes weekly; and   sleep for at least 7 hours a day.  - I advised patient to maintain close follow up with [name], MD for primary care needs.  - Time spent with the patient: 25 min, of which >50% was spent in reviewing his blood glucose logs , discussing his hypo- and hyper-glycemic episodes, reviewing his current and  previous labs and insulin doses and developing a plan to avoid hypo- and hyper-glycemia. Please refer to Patient Instructions for Blood Glucose Monitoring and Insulin/Medications Dosing Guide"  in media tab for additional information. Lance [name] participated in the discussions, expressed understanding, and voiced agreement with the above plans.  All questions were answered to his satisfaction. he is encouraged to contact clinic should he have any questions or concerns prior to his return visit.   Follow up plan: - Return in about 2 weeks (around 07/15/2017) for follow up with meter and logs- no labs.  Glade Lloyd, MD [facility] [address] [place], [place] [address] Phone: [phone]  Fax: [phone]    07/01/2017, 1:20 PM  This note was partially dictated with voice recognition software. Similar sounding words can be transcribed inadequately or may not  be corrected upon review.

## 2017-07-01 NOTE — Telephone Encounter (Signed)
Written and faxed to [employer]

## 2017-07-02 ENCOUNTER — Encounter (INDEPENDENT_AMBULATORY_CARE_PROVIDER_SITE_OTHER): Payer: Self-pay | Admitting: Family

## 2017-07-02 ENCOUNTER — Ambulatory Visit (INDEPENDENT_AMBULATORY_CARE_PROVIDER_SITE_OTHER): Payer: BLUE CROSS/BLUE SHIELD | Admitting: Family

## 2017-07-02 VITALS — Ht 75.0 in | Wt 216.0 lb

## 2017-07-02 DIAGNOSIS — I96 Gangrene, not elsewhere classified: Secondary | ICD-10-CM

## 2017-07-02 DIAGNOSIS — Z89511 Acquired absence of right leg below knee: Secondary | ICD-10-CM

## 2017-07-02 DIAGNOSIS — N2581 Secondary hyperparathyroidism of renal origin: Secondary | ICD-10-CM | POA: Diagnosis not present

## 2017-07-02 DIAGNOSIS — S88111A Complete traumatic amputation at level between knee and ankle, right lower leg, initial encounter: Secondary | ICD-10-CM

## 2017-07-02 DIAGNOSIS — N186 End stage renal disease: Secondary | ICD-10-CM | POA: Diagnosis not present

## 2017-07-02 NOTE — Progress Notes (Signed)
Office Visit Note   Patient: Lance [name]           Date of Birth: 11-29-1970           MRN: [id number] Visit Date: 07/02/2017              Requested by: [name], [address] [place], [place] [address] PCP: [name], MD  Chief Complaint  Patient presents with  . Right Leg - Routine Post Op    06/06/17 right BKA      HPI: Patient presents about 4 weeks status post right knee amputation.  Patient is in the compression shrinker today from [employer]. Also is in extension splint from [place]. Doing well. Is at skilled nursing.   Assessment & Plan: Visit Diagnoses:  1. Amputation of right lower extremity below knee (HCC)   2. Gangrene of right foot ([place])     Plan: Would like to continue with staples for one more week.  Recommended elevation recommend wearing the compression stocking.  Harvest the staples at follow-up.  Follow-Up Instructions: Return in about 1 week (around 07/09/2017).   Ortho Exam  Patient is alert, oriented, no adenopathy, well-dressed, normal affect, normal respiratory effort. Incision well approximated with sutures. Is healing slowly. patient has some thin ischemic changes around center of the surgical incision there is a small amount of clear serosanguineous drainage.  Patient has reduced swelling.  Imaging: No results found. No images are attached to the encounter.  Labs: Lab Results  Component Value Date   HGBA1C 9.1 (H) 02/18/2017   HGBA1C 9.8 (H) 11/28/2015   HGBA1C 9.7 (H) 12/26/2013   ESRSEDRATE 120 (H) 11/13/2015   CRP 35.4 (H) 11/13/2015   REPTSTATUS 12/06/2015 FINAL 11/30/2015   REPTSTATUS 11/30/2015 FINAL 11/30/2015   GRAMSTAIN  11/30/2015    WBC PRESENT,BOTH PMN AND MONONUCLEAR NO ORGANISMS SEEN CYTOSPIN SMEAR    CULT NO GROWTH 5 DAYS 11/30/2015   LABORGA STREPTOCOCCUS PNEUMONIAE 11/12/2015     Lab Results  Component Value Date   ALBUMIN 2.0 (L) 06/18/2017   ALBUMIN 2.2 (L) 06/16/2017   ALBUMIN 2.3 (L) 06/13/2017    Body mass index is 27 kg/m.  Orders:  No orders of the defined types were placed in this encounter.  No orders of the defined types were placed in this encounter.    Procedures: No procedures performed  Clinical Data: No additional findings.  ROS:  All other systems negative, except as noted in the HPI. Review of Systems  Constitutional: Negative for chills and fever.    Objective: Vital Signs: Ht 6\' 3"  (1.905 m)   Wt 216 lb (98 kg)   BMI 27.00 kg/m   Specialty Comments:  No specialty comments available.  PMFS History: Patient Active Problem List   Diagnosis Date Noted  . Benign essential HTN   . Chronic diastolic (congestive) heart failure ([place])   . Labile blood glucose   . Labile blood pressure   . Vomiting   . Phantom limb pain ([place])   . Poorly controlled type 2 diabetes mellitus with peripheral neuropathy ([place])   . Essential hypertension   . Unilateral complete BKA, right, sequela ([place])   . Amputation of right lower extremity below knee ([place]) 06/10/2017  . Unilateral complete BKA, right, initial encounter ([place])   . ESRD on dialysis ([place])   . Chronic diastolic congestive heart failure ([place])   . History of PSVT (paroxysmal supraventricular tachycardia)   . Diabetes mellitus  type 2 in nonobese ([place])   . Post-operative pain   . Acute blood loss anemia   . Anemia of chronic disease   . Leukocytosis   . Tachycardia   . History of right below knee amputation ([place]) 06/06/2017  . Gangrene of right foot ([place])   . PVD (peripheral vascular disease) ([place]) 05/13/2017  . Bilateral pleural effusion 11/28/2015  . Loculated pleural effusion 11/28/2015  . HCAP (healthcare-associated pneumonia) 11/28/2015  . ESRD (end stage renal disease) on dialysis ([place]) 11/12/2015  . Hyperkalemia 11/12/2015  . Shoulder pain, acute 11/12/2015  . Nausea & vomiting 11/12/2015  . Diarrhea 11/12/2015  . Hypertensive urgency 05/05/2014  . Nephrotic  syndrome 12/29/2013  . Hyponatremia 12/28/2013  . Acute diastolic heart failure ([place]) 12/28/2013  . Hypoglycemia 12/28/2013  . Dyspnea   . Acute renal failure syndrome ([place])   . Diabetic ketoacidosis without coma associated with type 1 diabetes mellitus ([place])   . Demand ischemia ([place]) 12/24/2013  . DKA (diabetic ketoacidoses) ([place]) 12/23/2013  . CAP (community acquired pneumonia) 12/23/2013  . Sepsis due to pneumonia ([place]) 12/23/2013  . Metabolic acidemia 45/80/9983  . Acute renal failure ([place]) 12/23/2013  . Diabetic ketoacidosis ([place]) 12/23/2013  . Chronic diastolic heart failure ([place]) 04/14/2013  . PSVT (paroxysmal supraventricular tachycardia) ([place]) 04/14/2013  . Bilateral leg edema 03/26/2013  . Chronic kidney disease, stage III (moderate) ([place]) 09/29/2011  . Type 1 diabetes mellitus with end-stage renal disease ([place]) 09/26/2010  . Essential hypertension, benign 09/26/2010  . Hyperlipidemia 09/26/2010   Past Medical History:  Diagnosis Date  . Aortic stenosis    Very mild in 05/2007  . Arthritis    "right shoulder; right knee; feel like I've got it all over" (11/28/2015)  . CHF (congestive heart failure) ([place])   . Degenerative joint disease    Left TKA  . Diastolic heart failure (HCC)    LVEF 65-70% with grade 2 diastolic dysfunction  . ESRD (end stage renal disease) on dialysis ([place])    "MWF; [facility] [address]" (11/28/2015)  . Essential hypertension, benign 2000   LVH  . GERD (gastroesophageal reflux disease)   . HCAP (healthcare-associated pneumonia) 11/28/2015  . Hyperlipidemia 2000  . Loculated pleural effusion 11/28/2015   [name] 11/28/2015  . Pneumonia 12/2013; 05/2014  . PSVT (paroxysmal supraventricular tachycardia) (HCC)    Nonsustained; asymptomatic; diagnosed by event recorder in 2006  . Tobacco abuse, in remission    20 pack years; discontinued in 1985; bronchitic changes on chest x-ray and 2009  . Type 1 diabetes mellitus ([place]) 1990    Family History   Problem Relation Age of Onset  . Diabetes Father   . Hypertension Father   . Diabetes Mother   . Hypertension Mother     Past Surgical History:  Procedure Laterality Date  . AMPUTATION Right 06/06/2017   Procedure: RIGHT BELOW KNEE AMPUTATION;  Surgeon: [name], MD;  Location: [facility];  Service: Orthopedics;  Laterality: Right;  . AV FISTULA PLACEMENT Right 06/23/2014   Procedure: ARTERIOVENOUS (AV) FISTULA CREATION;  Surgeon: Angelia Mould, MD;  Location: [facility];  Service: Vascular;  Laterality: Right;  . CARDIAC CATHETERIZATION     [facility]- evaluation for Kidney/ pancreas transplant  . EYE SURGERY Bilateral    "for glaucoma"  . INGUINAL HERNIA REPAIR Right    As a child  . LOWER EXTREMITY ANGIOGRAPHY N/A 05/27/2017   Procedure: LOWER EXTREMITY ANGIOGRAPHY;  Surgeon: [name], MD;  Location: [facility];  Service: Cardiovascular;  Laterality: N/A;  . WISDOM TOOTH EXTRACTION  1987   Social History   Occupational History  . Not on file  Tobacco Use  . Smoking status: Never Smoker  . Smokeless tobacco: Never Used  Substance and Sexual Activity  . Alcohol use: Yes    Comment: 11/28/2015 "used to drink; stopped ~ 2 yr ago"  . Drug use: No  . Sexual activity: Yes

## 2017-07-03 ENCOUNTER — Encounter: Payer: Self-pay | Admitting: Physical Medicine & Rehabilitation

## 2017-07-03 ENCOUNTER — Encounter
Payer: BLUE CROSS/BLUE SHIELD | Attending: Physical Medicine & Rehabilitation | Admitting: Physical Medicine & Rehabilitation

## 2017-07-03 VITALS — BP 160/93 | HR 95 | Ht 75.0 in | Wt 217.0 lb

## 2017-07-03 DIAGNOSIS — Z992 Dependence on renal dialysis: Secondary | ICD-10-CM

## 2017-07-03 DIAGNOSIS — E1051 Type 1 diabetes mellitus with diabetic peripheral angiopathy without gangrene: Secondary | ICD-10-CM | POA: Diagnosis not present

## 2017-07-03 DIAGNOSIS — K5900 Constipation, unspecified: Secondary | ICD-10-CM | POA: Insufficient documentation

## 2017-07-03 DIAGNOSIS — Z833 Family history of diabetes mellitus: Secondary | ICD-10-CM | POA: Insufficient documentation

## 2017-07-03 DIAGNOSIS — R269 Unspecified abnormalities of gait and mobility: Secondary | ICD-10-CM | POA: Diagnosis not present

## 2017-07-03 DIAGNOSIS — M1711 Unilateral primary osteoarthritis, right knee: Secondary | ICD-10-CM | POA: Diagnosis not present

## 2017-07-03 DIAGNOSIS — G546 Phantom limb syndrome with pain: Secondary | ICD-10-CM | POA: Diagnosis not present

## 2017-07-03 DIAGNOSIS — E1165 Type 2 diabetes mellitus with hyperglycemia: Secondary | ICD-10-CM | POA: Diagnosis not present

## 2017-07-03 DIAGNOSIS — S88111S Complete traumatic amputation at level between knee and ankle, right lower leg, sequela: Secondary | ICD-10-CM

## 2017-07-03 DIAGNOSIS — K5903 Drug induced constipation: Secondary | ICD-10-CM

## 2017-07-03 DIAGNOSIS — I10 Essential (primary) hypertension: Secondary | ICD-10-CM | POA: Diagnosis not present

## 2017-07-03 DIAGNOSIS — E1122 Type 2 diabetes mellitus with diabetic chronic kidney disease: Secondary | ICD-10-CM | POA: Diagnosis not present

## 2017-07-03 DIAGNOSIS — Z8249 Family history of ischemic heart disease and other diseases of the circulatory system: Secondary | ICD-10-CM | POA: Diagnosis not present

## 2017-07-03 DIAGNOSIS — I70243 Atherosclerosis of native arteries of left leg with ulceration of ankle: Secondary | ICD-10-CM

## 2017-07-03 DIAGNOSIS — Z09 Encounter for follow-up examination after completed treatment for conditions other than malignant neoplasm: Secondary | ICD-10-CM | POA: Diagnosis not present

## 2017-07-03 DIAGNOSIS — Z4781 Encounter for orthopedic aftercare following surgical amputation: Secondary | ICD-10-CM | POA: Diagnosis not present

## 2017-07-03 DIAGNOSIS — E1142 Type 2 diabetes mellitus with diabetic polyneuropathy: Secondary | ICD-10-CM

## 2017-07-03 DIAGNOSIS — Z89511 Acquired absence of right leg below knee: Secondary | ICD-10-CM | POA: Insufficient documentation

## 2017-07-03 DIAGNOSIS — N186 End stage renal disease: Secondary | ICD-10-CM | POA: Insufficient documentation

## 2017-07-03 DIAGNOSIS — F17200 Nicotine dependence, unspecified, uncomplicated: Secondary | ICD-10-CM | POA: Diagnosis not present

## 2017-07-03 DIAGNOSIS — D631 Anemia in chronic kidney disease: Secondary | ICD-10-CM | POA: Diagnosis not present

## 2017-07-03 DIAGNOSIS — I5032 Chronic diastolic (congestive) heart failure: Secondary | ICD-10-CM | POA: Diagnosis not present

## 2017-07-03 DIAGNOSIS — I132 Hypertensive heart and chronic kidney disease with heart failure and with stage 5 chronic kidney disease, or end stage renal disease: Secondary | ICD-10-CM | POA: Diagnosis not present

## 2017-07-03 DIAGNOSIS — M19011 Primary osteoarthritis, right shoulder: Secondary | ICD-10-CM | POA: Diagnosis not present

## 2017-07-03 DIAGNOSIS — I471 Supraventricular tachycardia: Secondary | ICD-10-CM | POA: Diagnosis not present

## 2017-07-03 DIAGNOSIS — Z9889 Other specified postprocedural states: Secondary | ICD-10-CM | POA: Diagnosis not present

## 2017-07-03 DIAGNOSIS — E1022 Type 1 diabetes mellitus with diabetic chronic kidney disease: Secondary | ICD-10-CM | POA: Diagnosis not present

## 2017-07-03 DIAGNOSIS — G8918 Other acute postprocedural pain: Secondary | ICD-10-CM

## 2017-07-03 DIAGNOSIS — E1042 Type 1 diabetes mellitus with diabetic polyneuropathy: Secondary | ICD-10-CM | POA: Diagnosis not present

## 2017-07-03 MED ORDER — POLYETHYLENE GLYCOL 3350 17 G PO PACK: 17.0000 g | PACK | Freq: Every day | ORAL | 1 refills | Status: DC

## 2017-07-03 NOTE — Progress Notes (Signed)
Subjective:    Patient ID: Lance [name], male    DOB: Aug 05, 1970, 47 y.o.   MRN: [id number]  HPI 47 year old right-handed male with a history of end-stage renal disease, diastolic congestive heart failure, diabetes mellitus, tobacco abuse presents for hospital follow up after receiving CIR for right BKA.  DATE OF ADMISSION 06/10/2017.  DATE OF DISCHARGE: 06/18/2017    At discharge, he was instructed to follow up with Ortho, which he did, notes reviewed.  He still has staples, with plans to remove next week. He is following up with Nephrology. He saw his PCP. Overall, his pain has been controlled. His BP is elevated, but states it is normally WNL. His CBGs have been elevated; his Endo recently adjusted insulin.  Therapies: None at present DME: Shower chair Mobility: Walker at home and wheelchair in community  Pain Inventory Average Pain 6 Pain Right Now 6 My pain is intermittent, sharp and tingling  In the last 24 hours, has pain interfered with the following? General activity 0 Relation with others 0 Enjoyment of life 0 What TIME of day is your pain at its worst? night Sleep (in general) Poor  Pain is worse with: standing Pain improves with: medication Relief from Meds: 6  Mobility use a walker ability to climb steps?  no do you drive?  no use a wheelchair  Function disabled: date disabled 2017 I need assistance with the following:  bathing  Neuro/Psych spasms  Prior Studies Any changes since last visit?  no  Physicians involved in your care Any changes since last visit?  no   Family History  Problem Relation Age of Onset  . Diabetes Father   . Hypertension Father   . Diabetes Mother   . Hypertension Mother    Social History   Socioeconomic History  . Marital status: Married    Spouse name: Not on file  . Number of children: Not on file  . Years of education: Not on file  . Highest education level: Not on file  Occupational History  . Not  on file  Social Needs  . Financial resource strain: Not on file  . Food insecurity:    Worry: Not on file    Inability: Not on file  . Transportation needs:    Medical: Not on file    Non-medical: Not on file  Tobacco Use  . Smoking status: Never Smoker  . Smokeless tobacco: Never Used  Substance and Sexual Activity  . Alcohol use: Yes    Comment: 11/28/2015 "used to drink; stopped ~ 2 yr ago"  . Drug use: No  . Sexual activity: Yes  Lifestyle  . Physical activity:    Days per week: Not on file    Minutes per session: Not on file  . Stress: Not on file  Relationships  . Social connections:    Talks on phone: Not on file    Gets together: Not on file    Attends religious service: Not on file    Active member of club or organization: Not on file    Attends meetings of clubs or organizations: Not on file    Relationship status: Not on file  Other Topics Concern  . Not on file  Social History Narrative  . Not on file   Past Surgical History:  Procedure Laterality Date  . AMPUTATION Right 06/06/2017   Procedure: RIGHT BELOW KNEE AMPUTATION;  Surgeon: [name], MD;  Location: [place];  Service: Orthopedics;  Laterality:  Right;  . AV FISTULA PLACEMENT Right 06/23/2014   Procedure: ARTERIOVENOUS (AV) FISTULA CREATION;  Surgeon: Angelia Mould, MD;  Location: [place];  Service: Vascular;  Laterality: Right;  . CARDIAC CATHETERIZATION     [facility]- evaluation for Kidney/ pancreas transplant  . EYE SURGERY Bilateral    "for glaucoma"  . INGUINAL HERNIA REPAIR Right    As a child  . LOWER EXTREMITY ANGIOGRAPHY N/A 05/27/2017   Procedure: LOWER EXTREMITY ANGIOGRAPHY;  Surgeon: [name], MD;  Location: [place] [facility];  Service: Cardiovascular;  Laterality: N/A;  . WISDOM TOOTH EXTRACTION  1987   Past Medical History:  Diagnosis Date  . Aortic stenosis    Very mild in 05/2007  . Arthritis    "right shoulder; right knee; feel like I've got it all over"  (11/28/2015)  . CHF (congestive heart failure) ([place])   . Degenerative joint disease    Left TKA  . Diastolic heart failure (HCC)    LVEF 65-70% with grade 2 diastolic dysfunction  . ESRD (end stage renal disease) on dialysis ([place])    "MWF; [facility] [address]" (11/28/2015)  . Essential hypertension, benign 2000   LVH  . GERD (gastroesophageal reflux disease)   . HCAP (healthcare-associated pneumonia) 11/28/2015  . Hyperlipidemia 2000  . Loculated pleural effusion 11/28/2015   [name] 11/28/2015  . Pneumonia 12/2013; 05/2014  . PSVT (paroxysmal supraventricular tachycardia) (HCC)    Nonsustained; asymptomatic; diagnosed by event recorder in 2006  . Tobacco abuse, in remission    20 pack years; discontinued in 1985; bronchitic changes on chest x-ray and 2009  . Type 1 diabetes mellitus (HCC) 1990   BP (!) 160/93   Pulse 95   Ht 6\' 3"  (1.905 m)   Wt 217 lb (98.4 kg)   SpO2 98%   BMI 27.12 kg/m   Opioid Risk Score:   Fall Risk Score:  `1  Depression screen PHQ 2/9  Depression screen [facility] 2/9 07/01/2017 04/01/2017 03/25/2017 02/18/2017 01/07/2017 01/07/2017  Decreased Interest 0 0 0 0 0 0  Down, Depressed, Hopeless 0 0 0 0 0 0  PHQ - 2 Score 0 0 0 0 0 0     Review of Systems  Constitutional: Negative.   HENT: Negative.   Eyes: Negative.   Respiratory: Negative.   Cardiovascular: Negative.   Gastrointestinal: Positive for constipation, nausea and vomiting.  Endocrine: Negative.        High/low blood sugar  Genitourinary: Negative.   Musculoskeletal: Positive for arthralgias, gait problem and myalgias.  Skin: Negative.   Allergic/Immunologic: Negative.   Hematological: Negative.   Psychiatric/Behavioral: Negative.   All other systems reviewed and are negative.      Objective:   Physical Exam Constitutional: No distress . Vital signs reviewed. Well-developed. HENT: Normocephalic.  Atraumatic. Eyes: EOMI. No discharge. Cardiovascular: RRR. No JVD. Respiratory: CTA  Bilaterally.  Normal effort. GI: BS +. Non-distended. Musc: No edema. Tenderness in right BKA (improving) Neurological: He is alert and oriented  Motor: B/l UE 5/5 prox to distal.  LLE 5/5.   RLE: HF 4+/5  Skin: Right BKA with dressing C/D/I Abrasion on knee from initial fall healing Psychiatric: He has a normal mood and affect. His behavior is normal    Assessment & Plan:  48 year old right-handed male with a history of end-stage renal disease, diastolic congestive heart failure, diabetes mellitus, tobacco abuse presents for hospital follow up after receiving CIR for right BKA.  1.  Decreased functional mobility secondary to  right transtibial amputation 06/06/2017.    Resume therapies when closer to prosthetic time  Cont follow up with Dr. Sharol Given  2. Pain Management/Phantom limb pain:   Oxy being prescribed by Ortho  Cont Robaxin PRN  Cont Gabapentin  3. End-stage renal disease.    Cont recs per Neprho  4. Hypertension.    Cont meds  Elevated today, however normally controlled per pt  5. Diabetes mellitus with peripheral neuropathy.    Cont meds  Cont follow up with Endo  6. Constipation.    Miralax daily ordered  7. Gait abnormality  Cont walker/wheelchair for safety  Resume therapies when appropriate  Meds reviewed Referrals reviewed All questions answered

## 2017-07-04 DIAGNOSIS — N2581 Secondary hyperparathyroidism of renal origin: Secondary | ICD-10-CM | POA: Diagnosis not present

## 2017-07-04 DIAGNOSIS — N186 End stage renal disease: Secondary | ICD-10-CM | POA: Diagnosis not present

## 2017-07-06 DIAGNOSIS — F17200 Nicotine dependence, unspecified, uncomplicated: Secondary | ICD-10-CM | POA: Diagnosis not present

## 2017-07-06 DIAGNOSIS — M19011 Primary osteoarthritis, right shoulder: Secondary | ICD-10-CM | POA: Diagnosis not present

## 2017-07-06 DIAGNOSIS — E1051 Type 1 diabetes mellitus with diabetic peripheral angiopathy without gangrene: Secondary | ICD-10-CM | POA: Diagnosis not present

## 2017-07-06 DIAGNOSIS — Z4781 Encounter for orthopedic aftercare following surgical amputation: Secondary | ICD-10-CM | POA: Diagnosis not present

## 2017-07-06 DIAGNOSIS — I132 Hypertensive heart and chronic kidney disease with heart failure and with stage 5 chronic kidney disease, or end stage renal disease: Secondary | ICD-10-CM | POA: Diagnosis not present

## 2017-07-06 DIAGNOSIS — E1042 Type 1 diabetes mellitus with diabetic polyneuropathy: Secondary | ICD-10-CM | POA: Diagnosis not present

## 2017-07-06 DIAGNOSIS — D631 Anemia in chronic kidney disease: Secondary | ICD-10-CM | POA: Diagnosis not present

## 2017-07-06 DIAGNOSIS — I471 Supraventricular tachycardia: Secondary | ICD-10-CM | POA: Diagnosis not present

## 2017-07-06 DIAGNOSIS — M1711 Unilateral primary osteoarthritis, right knee: Secondary | ICD-10-CM | POA: Diagnosis not present

## 2017-07-06 DIAGNOSIS — I5032 Chronic diastolic (congestive) heart failure: Secondary | ICD-10-CM | POA: Diagnosis not present

## 2017-07-06 DIAGNOSIS — N186 End stage renal disease: Secondary | ICD-10-CM | POA: Diagnosis not present

## 2017-07-06 DIAGNOSIS — E1022 Type 1 diabetes mellitus with diabetic chronic kidney disease: Secondary | ICD-10-CM | POA: Diagnosis not present

## 2017-07-06 DIAGNOSIS — Z992 Dependence on renal dialysis: Secondary | ICD-10-CM | POA: Diagnosis not present

## 2017-07-07 DIAGNOSIS — N2581 Secondary hyperparathyroidism of renal origin: Secondary | ICD-10-CM | POA: Diagnosis not present

## 2017-07-07 DIAGNOSIS — N186 End stage renal disease: Secondary | ICD-10-CM | POA: Diagnosis not present

## 2017-07-08 DIAGNOSIS — E1022 Type 1 diabetes mellitus with diabetic chronic kidney disease: Secondary | ICD-10-CM | POA: Diagnosis not present

## 2017-07-08 DIAGNOSIS — M1711 Unilateral primary osteoarthritis, right knee: Secondary | ICD-10-CM | POA: Diagnosis not present

## 2017-07-08 DIAGNOSIS — Z992 Dependence on renal dialysis: Secondary | ICD-10-CM | POA: Diagnosis not present

## 2017-07-08 DIAGNOSIS — Z4781 Encounter for orthopedic aftercare following surgical amputation: Secondary | ICD-10-CM | POA: Diagnosis not present

## 2017-07-08 DIAGNOSIS — E1051 Type 1 diabetes mellitus with diabetic peripheral angiopathy without gangrene: Secondary | ICD-10-CM | POA: Diagnosis not present

## 2017-07-08 DIAGNOSIS — I132 Hypertensive heart and chronic kidney disease with heart failure and with stage 5 chronic kidney disease, or end stage renal disease: Secondary | ICD-10-CM | POA: Diagnosis not present

## 2017-07-08 DIAGNOSIS — N186 End stage renal disease: Secondary | ICD-10-CM | POA: Diagnosis not present

## 2017-07-08 DIAGNOSIS — I5032 Chronic diastolic (congestive) heart failure: Secondary | ICD-10-CM | POA: Diagnosis not present

## 2017-07-08 DIAGNOSIS — F17200 Nicotine dependence, unspecified, uncomplicated: Secondary | ICD-10-CM | POA: Diagnosis not present

## 2017-07-08 DIAGNOSIS — D631 Anemia in chronic kidney disease: Secondary | ICD-10-CM | POA: Diagnosis not present

## 2017-07-08 DIAGNOSIS — E1042 Type 1 diabetes mellitus with diabetic polyneuropathy: Secondary | ICD-10-CM | POA: Diagnosis not present

## 2017-07-08 DIAGNOSIS — M19011 Primary osteoarthritis, right shoulder: Secondary | ICD-10-CM | POA: Diagnosis not present

## 2017-07-08 DIAGNOSIS — I471 Supraventricular tachycardia: Secondary | ICD-10-CM | POA: Diagnosis not present

## 2017-07-09 ENCOUNTER — Encounter (INDEPENDENT_AMBULATORY_CARE_PROVIDER_SITE_OTHER): Payer: Self-pay | Admitting: Family

## 2017-07-09 ENCOUNTER — Ambulatory Visit (INDEPENDENT_AMBULATORY_CARE_PROVIDER_SITE_OTHER): Payer: BLUE CROSS/BLUE SHIELD | Admitting: Family

## 2017-07-09 DIAGNOSIS — N186 End stage renal disease: Secondary | ICD-10-CM | POA: Diagnosis not present

## 2017-07-09 DIAGNOSIS — S88111S Complete traumatic amputation at level between knee and ankle, right lower leg, sequela: Secondary | ICD-10-CM

## 2017-07-09 DIAGNOSIS — S88111A Complete traumatic amputation at level between knee and ankle, right lower leg, initial encounter: Secondary | ICD-10-CM

## 2017-07-09 DIAGNOSIS — E119 Type 2 diabetes mellitus without complications: Secondary | ICD-10-CM

## 2017-07-09 DIAGNOSIS — N2581 Secondary hyperparathyroidism of renal origin: Secondary | ICD-10-CM | POA: Diagnosis not present

## 2017-07-09 MED ORDER — OXYCODONE-ACETAMINOPHEN 5-325 MG PO TABS: 1.0000 | ORAL_TABLET | Freq: Two times a day (BID) | ORAL | 0 refills | Status: AC | PRN

## 2017-07-09 MED ORDER — SILVER SULFADIAZINE 1 % EX CREA: 1.0000 "application " | TOPICAL_CREAM | Freq: Every day | CUTANEOUS | 0 refills | Status: DC

## 2017-07-09 NOTE — Progress Notes (Signed)
Office Visit Note   Patient: Lance [name]           Date of Birth: 01/18/1971           MRN: [id number] Visit Date: 07/09/2017              Requested by: [name], [address], [place] [address] PCP: [name], MD  Chief Complaint  Patient presents with  . Right Leg - Follow-up, Routine Post Op      HPI: Patient presents status post right knee amputation on 06/06/17.  Patient is in the compression shrinker today from [employer]. Also is in extension splint from [employer]. Doing well. Is at skilled nursing.   Assessment & Plan: Visit Diagnoses:  1. Unilateral complete BKA, right, initial encounter ([place])   2. Unilateral complete BKA, right, sequela ([place])   3. Diabetes mellitus type 2 in nonobese [facility])     Plan: Staples harvested. May apply silvadene to the eschar to center of incision.  Recommended elevation recommend wearing the compression stocking.  Will has HH to assist with dressing changes and incision care. [facility] currently caring for patient.  Follow-Up Instructions: Return in about 2 weeks (around 07/23/2017).   Ortho Exam  Patient is alert, oriented, no adenopathy, well-dressed, normal affect, normal respiratory effort. Incision well approximated with sutures. Is healing slowly. patient has some thin ischemic changes around center of the surgical incision, a length of 4 cm. Maceration along incision. No significant swelling.  Imaging: No results found. No images are attached to the encounter.  Labs: Lab Results  Component Value Date   HGBA1C 9.1 (H) 02/18/2017   HGBA1C 9.8 (H) 11/28/2015   HGBA1C 9.7 (H) 12/26/2013   ESRSEDRATE 120 (H) 11/13/2015   CRP 35.4 (H) 11/13/2015   REPTSTATUS 12/06/2015 FINAL 11/30/2015   REPTSTATUS 11/30/2015 FINAL 11/30/2015   GRAMSTAIN  11/30/2015    WBC PRESENT,BOTH PMN AND MONONUCLEAR NO ORGANISMS SEEN CYTOSPIN SMEAR    CULT NO GROWTH 5 DAYS 11/30/2015   LABORGA STREPTOCOCCUS  PNEUMONIAE 11/12/2015     Lab Results  Component Value Date   ALBUMIN 2.0 (L) 06/18/2017   ALBUMIN 2.2 (L) 06/16/2017   ALBUMIN 2.3 (L) 06/13/2017    There is no height or weight on file to calculate BMI.  Orders:  No orders of the defined types were placed in this encounter.  Meds ordered this encounter  Medications  . silver sulfADIAZINE (SILVADENE) 1 % cream    Sig: Apply 1 application topically daily.    Dispense:  50 g    Refill:  0  . oxyCODONE-acetaminophen (PERCOCET/ROXICET) 5-325 MG tablet    Sig: Take 1 tablet by mouth 2 (two) times daily as needed for up to 7 days.    Dispense:  14 tablet    Refill:  0     Procedures: No procedures performed  Clinical Data: No additional findings.  ROS:  All other systems negative, except as noted in the HPI. Review of Systems  Constitutional: Negative for chills and fever.    Objective: Vital Signs: There were no vitals taken for this visit.  Specialty Comments:  No specialty comments available.  PMFS History: Patient Active Problem List   Diagnosis Date Noted  . Benign essential HTN   . Chronic diastolic (congestive) heart failure ([place])   . Labile blood glucose   . Labile blood pressure   . Vomiting   . Phantom limb pain ([place])   .  Poorly controlled type 2 diabetes mellitus with peripheral neuropathy ([place])   . Essential hypertension   . Unilateral complete BKA, right, sequela ([place])   . Amputation of right lower extremity below knee ([place]) 06/10/2017  . ESRD on dialysis ([place])   . Chronic diastolic congestive heart failure ([place])   . History of PSVT (paroxysmal supraventricular tachycardia)   . Diabetes mellitus type 2 in nonobese (HCC)   . Post-operative pain   . Acute blood loss anemia   . Anemia of chronic disease   . Leukocytosis   . Tachycardia   . History of right below knee amputation ([place]) 06/06/2017  . Gangrene of right foot ([place])   . PVD (peripheral vascular disease) ([place]) 05/13/2017  .  Bilateral pleural effusion 11/28/2015  . Loculated pleural effusion 11/28/2015  . HCAP (healthcare-associated pneumonia) 11/28/2015  . ESRD (end stage renal disease) on dialysis ([place]) 11/12/2015  . Hyperkalemia 11/12/2015  . Shoulder pain, acute 11/12/2015  . Nausea & vomiting 11/12/2015  . Diarrhea 11/12/2015  . Hypertensive urgency 05/05/2014  . Nephrotic syndrome 12/29/2013  . Hyponatremia 12/28/2013  . Acute diastolic heart failure ([place]) 12/28/2013  . Hypoglycemia 12/28/2013  . Dyspnea   . Acute renal failure syndrome ([place])   . Diabetic ketoacidosis without coma associated with type 1 diabetes mellitus ([place])   . Demand ischemia ([place]) 12/24/2013  . DKA (diabetic ketoacidoses) ([place]) 12/23/2013  . CAP (community acquired pneumonia) 12/23/2013  . Sepsis due to pneumonia ([place]) 12/23/2013  . Metabolic acidemia 84/13/2440  . Acute renal failure ([place]) 12/23/2013  . Diabetic ketoacidosis ([place]) 12/23/2013  . Chronic diastolic heart failure ([place]) 04/14/2013  . PSVT (paroxysmal supraventricular tachycardia) ([place]) 04/14/2013  . Bilateral leg edema 03/26/2013  . Chronic kidney disease, stage III (moderate) ([place]) 09/29/2011  . Type 1 diabetes mellitus with end-stage renal disease ([place]) 09/26/2010  . Essential hypertension, benign 09/26/2010  . Hyperlipidemia 09/26/2010   Past Medical History:  Diagnosis Date  . Aortic stenosis    Very mild in 05/2007  . Arthritis    "right shoulder; right knee; feel like I've got it all over" (11/28/2015)  . CHF (congestive heart failure) ([place])   . Degenerative joint disease    Left TKA  . Diastolic heart failure (HCC)    LVEF 65-70% with grade 2 diastolic dysfunction  . ESRD (end stage renal disease) on dialysis ([place])    "MWF; [facility] [address]" (11/28/2015)  . Essential hypertension, benign 2000   LVH  . GERD (gastroesophageal reflux disease)   . HCAP (healthcare-associated pneumonia) 11/28/2015  . Hyperlipidemia 2000  . Loculated pleural  effusion 11/28/2015   [name] 11/28/2015  . Pneumonia 12/2013; 05/2014  . PSVT (paroxysmal supraventricular tachycardia) (HCC)    Nonsustained; asymptomatic; diagnosed by event recorder in 2006  . Tobacco abuse, in remission    20 pack years; discontinued in 1985; bronchitic changes on chest x-ray and 2009  . Type 1 diabetes mellitus ([place]) 1990    Family History  Problem Relation Age of Onset  . Diabetes Father   . Hypertension Father   . Diabetes Mother   . Hypertension Mother     Past Surgical History:  Procedure Laterality Date  . AMPUTATION Right 06/06/2017   Procedure: RIGHT BELOW KNEE AMPUTATION;  Surgeon: [name], MD;  Location: [place];  Service: Orthopedics;  Laterality: Right;  . AV FISTULA PLACEMENT Right 06/23/2014   Procedure: ARTERIOVENOUS (AV) FISTULA CREATION;  Surgeon: Angelia Mould, MD;  Location: [place];  Service:  Vascular;  Laterality: Right;  . CARDIAC CATHETERIZATION     [facility]- evaluation for Kidney/ pancreas transplant  . EYE SURGERY Bilateral    "for glaucoma"  . INGUINAL HERNIA REPAIR Right    As a child  . LOWER EXTREMITY ANGIOGRAPHY N/A 05/27/2017   Procedure: LOWER EXTREMITY ANGIOGRAPHY;  Surgeon: [name], MD;  Location: [facility];  Service: Cardiovascular;  Laterality: N/A;  . WISDOM TOOTH EXTRACTION  1987   Social History   Occupational History  . Not on file  Tobacco Use  . Smoking status: Never Smoker  . Smokeless tobacco: Never Used  Substance and Sexual Activity  . Alcohol use: Yes    Comment: 11/28/2015 "used to drink; stopped ~ 2 yr ago"  . Drug use: No  . Sexual activity: Yes

## 2017-07-10 ENCOUNTER — Telehealth (INDEPENDENT_AMBULATORY_CARE_PROVIDER_SITE_OTHER): Payer: Self-pay

## 2017-07-10 DIAGNOSIS — M19011 Primary osteoarthritis, right shoulder: Secondary | ICD-10-CM | POA: Diagnosis not present

## 2017-07-10 DIAGNOSIS — M1711 Unilateral primary osteoarthritis, right knee: Secondary | ICD-10-CM | POA: Diagnosis not present

## 2017-07-10 DIAGNOSIS — Z4781 Encounter for orthopedic aftercare following surgical amputation: Secondary | ICD-10-CM | POA: Diagnosis not present

## 2017-07-10 DIAGNOSIS — I5032 Chronic diastolic (congestive) heart failure: Secondary | ICD-10-CM | POA: Diagnosis not present

## 2017-07-10 DIAGNOSIS — I471 Supraventricular tachycardia: Secondary | ICD-10-CM | POA: Diagnosis not present

## 2017-07-10 DIAGNOSIS — I132 Hypertensive heart and chronic kidney disease with heart failure and with stage 5 chronic kidney disease, or end stage renal disease: Secondary | ICD-10-CM | POA: Diagnosis not present

## 2017-07-10 DIAGNOSIS — E1051 Type 1 diabetes mellitus with diabetic peripheral angiopathy without gangrene: Secondary | ICD-10-CM | POA: Diagnosis not present

## 2017-07-10 DIAGNOSIS — E1022 Type 1 diabetes mellitus with diabetic chronic kidney disease: Secondary | ICD-10-CM | POA: Diagnosis not present

## 2017-07-10 DIAGNOSIS — F17200 Nicotine dependence, unspecified, uncomplicated: Secondary | ICD-10-CM | POA: Diagnosis not present

## 2017-07-10 DIAGNOSIS — N2581 Secondary hyperparathyroidism of renal origin: Secondary | ICD-10-CM | POA: Diagnosis not present

## 2017-07-10 DIAGNOSIS — Z992 Dependence on renal dialysis: Secondary | ICD-10-CM | POA: Diagnosis not present

## 2017-07-10 DIAGNOSIS — D631 Anemia in chronic kidney disease: Secondary | ICD-10-CM | POA: Diagnosis not present

## 2017-07-10 DIAGNOSIS — E1042 Type 1 diabetes mellitus with diabetic polyneuropathy: Secondary | ICD-10-CM | POA: Diagnosis not present

## 2017-07-10 DIAGNOSIS — N186 End stage renal disease: Secondary | ICD-10-CM | POA: Diagnosis not present

## 2017-07-10 NOTE — Telephone Encounter (Signed)
-----   Message from Suzan Slick, NP sent at 07/09/2017 10:20 AM EDT ----- Will you update orders with [facility]

## 2017-07-10 NOTE — Telephone Encounter (Signed)
I called and sw Sonia Side with [facility] and they called the pt to apologize for the miscommunication and interruption in his care. He is back and the sch and has the nurse case managers phone number as a contact with any questions or concerns. He will continue with [facility] and call with any questions.

## 2017-07-11 DIAGNOSIS — Z992 Dependence on renal dialysis: Secondary | ICD-10-CM | POA: Diagnosis not present

## 2017-07-11 DIAGNOSIS — N186 End stage renal disease: Secondary | ICD-10-CM | POA: Diagnosis not present

## 2017-07-11 DIAGNOSIS — E1022 Type 1 diabetes mellitus with diabetic chronic kidney disease: Secondary | ICD-10-CM | POA: Diagnosis not present

## 2017-07-11 DIAGNOSIS — N2581 Secondary hyperparathyroidism of renal origin: Secondary | ICD-10-CM | POA: Diagnosis not present

## 2017-07-12 DIAGNOSIS — E1022 Type 1 diabetes mellitus with diabetic chronic kidney disease: Secondary | ICD-10-CM | POA: Diagnosis not present

## 2017-07-12 DIAGNOSIS — I471 Supraventricular tachycardia: Secondary | ICD-10-CM | POA: Diagnosis not present

## 2017-07-12 DIAGNOSIS — F17200 Nicotine dependence, unspecified, uncomplicated: Secondary | ICD-10-CM | POA: Diagnosis not present

## 2017-07-12 DIAGNOSIS — Z992 Dependence on renal dialysis: Secondary | ICD-10-CM | POA: Diagnosis not present

## 2017-07-12 DIAGNOSIS — M19011 Primary osteoarthritis, right shoulder: Secondary | ICD-10-CM | POA: Diagnosis not present

## 2017-07-12 DIAGNOSIS — M1711 Unilateral primary osteoarthritis, right knee: Secondary | ICD-10-CM | POA: Diagnosis not present

## 2017-07-12 DIAGNOSIS — I132 Hypertensive heart and chronic kidney disease with heart failure and with stage 5 chronic kidney disease, or end stage renal disease: Secondary | ICD-10-CM | POA: Diagnosis not present

## 2017-07-12 DIAGNOSIS — E1042 Type 1 diabetes mellitus with diabetic polyneuropathy: Secondary | ICD-10-CM | POA: Diagnosis not present

## 2017-07-12 DIAGNOSIS — I5032 Chronic diastolic (congestive) heart failure: Secondary | ICD-10-CM | POA: Diagnosis not present

## 2017-07-12 DIAGNOSIS — E1051 Type 1 diabetes mellitus with diabetic peripheral angiopathy without gangrene: Secondary | ICD-10-CM | POA: Diagnosis not present

## 2017-07-12 DIAGNOSIS — D631 Anemia in chronic kidney disease: Secondary | ICD-10-CM | POA: Diagnosis not present

## 2017-07-12 DIAGNOSIS — N186 End stage renal disease: Secondary | ICD-10-CM | POA: Diagnosis not present

## 2017-07-12 DIAGNOSIS — Z4781 Encounter for orthopedic aftercare following surgical amputation: Secondary | ICD-10-CM | POA: Diagnosis not present

## 2017-07-13 ENCOUNTER — Encounter: Payer: Self-pay | Admitting: Family Medicine

## 2017-07-14 DIAGNOSIS — N2581 Secondary hyperparathyroidism of renal origin: Secondary | ICD-10-CM | POA: Diagnosis not present

## 2017-07-14 DIAGNOSIS — N186 End stage renal disease: Secondary | ICD-10-CM | POA: Diagnosis not present

## 2017-07-15 DIAGNOSIS — F17200 Nicotine dependence, unspecified, uncomplicated: Secondary | ICD-10-CM | POA: Diagnosis not present

## 2017-07-15 DIAGNOSIS — M19011 Primary osteoarthritis, right shoulder: Secondary | ICD-10-CM | POA: Diagnosis not present

## 2017-07-15 DIAGNOSIS — I471 Supraventricular tachycardia: Secondary | ICD-10-CM | POA: Diagnosis not present

## 2017-07-15 DIAGNOSIS — Z4781 Encounter for orthopedic aftercare following surgical amputation: Secondary | ICD-10-CM | POA: Diagnosis not present

## 2017-07-15 DIAGNOSIS — D631 Anemia in chronic kidney disease: Secondary | ICD-10-CM | POA: Diagnosis not present

## 2017-07-15 DIAGNOSIS — Z992 Dependence on renal dialysis: Secondary | ICD-10-CM | POA: Diagnosis not present

## 2017-07-15 DIAGNOSIS — E1051 Type 1 diabetes mellitus with diabetic peripheral angiopathy without gangrene: Secondary | ICD-10-CM | POA: Diagnosis not present

## 2017-07-15 DIAGNOSIS — M1711 Unilateral primary osteoarthritis, right knee: Secondary | ICD-10-CM | POA: Diagnosis not present

## 2017-07-15 DIAGNOSIS — I132 Hypertensive heart and chronic kidney disease with heart failure and with stage 5 chronic kidney disease, or end stage renal disease: Secondary | ICD-10-CM | POA: Diagnosis not present

## 2017-07-15 DIAGNOSIS — E1022 Type 1 diabetes mellitus with diabetic chronic kidney disease: Secondary | ICD-10-CM | POA: Diagnosis not present

## 2017-07-15 DIAGNOSIS — E1042 Type 1 diabetes mellitus with diabetic polyneuropathy: Secondary | ICD-10-CM | POA: Diagnosis not present

## 2017-07-15 DIAGNOSIS — N2581 Secondary hyperparathyroidism of renal origin: Secondary | ICD-10-CM | POA: Diagnosis not present

## 2017-07-15 DIAGNOSIS — I5032 Chronic diastolic (congestive) heart failure: Secondary | ICD-10-CM | POA: Diagnosis not present

## 2017-07-15 DIAGNOSIS — N186 End stage renal disease: Secondary | ICD-10-CM | POA: Diagnosis not present

## 2017-07-16 ENCOUNTER — Telehealth (INDEPENDENT_AMBULATORY_CARE_PROVIDER_SITE_OTHER): Payer: Self-pay

## 2017-07-16 DIAGNOSIS — E1051 Type 1 diabetes mellitus with diabetic peripheral angiopathy without gangrene: Secondary | ICD-10-CM | POA: Diagnosis not present

## 2017-07-16 DIAGNOSIS — I5032 Chronic diastolic (congestive) heart failure: Secondary | ICD-10-CM | POA: Diagnosis not present

## 2017-07-16 DIAGNOSIS — E1022 Type 1 diabetes mellitus with diabetic chronic kidney disease: Secondary | ICD-10-CM | POA: Diagnosis not present

## 2017-07-16 DIAGNOSIS — F17200 Nicotine dependence, unspecified, uncomplicated: Secondary | ICD-10-CM | POA: Diagnosis not present

## 2017-07-16 DIAGNOSIS — M1711 Unilateral primary osteoarthritis, right knee: Secondary | ICD-10-CM | POA: Diagnosis not present

## 2017-07-16 DIAGNOSIS — E1042 Type 1 diabetes mellitus with diabetic polyneuropathy: Secondary | ICD-10-CM | POA: Diagnosis not present

## 2017-07-16 DIAGNOSIS — D631 Anemia in chronic kidney disease: Secondary | ICD-10-CM | POA: Diagnosis not present

## 2017-07-16 DIAGNOSIS — N186 End stage renal disease: Secondary | ICD-10-CM | POA: Diagnosis not present

## 2017-07-16 DIAGNOSIS — M19011 Primary osteoarthritis, right shoulder: Secondary | ICD-10-CM | POA: Diagnosis not present

## 2017-07-16 DIAGNOSIS — Z992 Dependence on renal dialysis: Secondary | ICD-10-CM | POA: Diagnosis not present

## 2017-07-16 DIAGNOSIS — I471 Supraventricular tachycardia: Secondary | ICD-10-CM | POA: Diagnosis not present

## 2017-07-16 DIAGNOSIS — Z4781 Encounter for orthopedic aftercare following surgical amputation: Secondary | ICD-10-CM | POA: Diagnosis not present

## 2017-07-16 DIAGNOSIS — I132 Hypertensive heart and chronic kidney disease with heart failure and with stage 5 chronic kidney disease, or end stage renal disease: Secondary | ICD-10-CM | POA: Diagnosis not present

## 2017-07-16 NOTE — Telephone Encounter (Signed)
I called and sw HHN and pt. Made appt for him to come in for eval tomorrow at 3:30. Can you please open a spot on the sch for him to come in? thanks

## 2017-07-16 NOTE — Telephone Encounter (Signed)
Ashley Mariner with [facility] called stating that the edges of the wound have separated and patient has a little yellow and bloody drainage.  CB# is [phone].  Please advise.  Thank you.

## 2017-07-17 ENCOUNTER — Ambulatory Visit: Payer: BLUE CROSS/BLUE SHIELD | Admitting: "Endocrinology

## 2017-07-17 ENCOUNTER — Ambulatory Visit (INDEPENDENT_AMBULATORY_CARE_PROVIDER_SITE_OTHER): Payer: BLUE CROSS/BLUE SHIELD | Admitting: Orthopedic Surgery

## 2017-07-17 ENCOUNTER — Encounter (INDEPENDENT_AMBULATORY_CARE_PROVIDER_SITE_OTHER): Payer: Self-pay | Admitting: Orthopedic Surgery

## 2017-07-17 VITALS — Ht 75.0 in | Wt 217.0 lb

## 2017-07-17 DIAGNOSIS — N2581 Secondary hyperparathyroidism of renal origin: Secondary | ICD-10-CM | POA: Diagnosis not present

## 2017-07-17 DIAGNOSIS — N186 End stage renal disease: Secondary | ICD-10-CM | POA: Diagnosis not present

## 2017-07-17 DIAGNOSIS — T8781 Dehiscence of amputation stump: Secondary | ICD-10-CM | POA: Insufficient documentation

## 2017-07-17 NOTE — Progress Notes (Signed)
Office Visit Note   Patient: Lance [name]           Date of Birth: [birth year]           MRN: [id number] Visit Date: 07/17/2017              Requested by: [name], [address], [place] [address] PCP: [name], MD  Chief Complaint  Patient presents with  . Right Leg - Routine Post Op    06/06/17 right BKA      HPI: Patient is a 47 year old gentleman type II diabetic with end-stage renal disease on dialysis currently at home on Monday Wednesday Thursday and Friday.  Patient has had progressive dehiscence of the transtibial amputation.  He is about 5 weeks out from surgery.  Assessment & Plan: Visit Diagnoses:  1. Dehiscence of amputation stump ([place])     Plan: With the progressive dehiscence we will need to plan for revision surgery patient would like to proceed with surgery a week from Friday.  He prefers outpatient surgery he will be able to coordinate this with his dialysis at home.  Plan for discharge with a Praveena wound VAC.  Patient will need protein nutritional supplements.  Follow-Up Instructions: Return in about 2 weeks (around 07/31/2017).   Ortho Exam  Patient is alert, oriented, no adenopathy, well-dressed, normal affect, normal respiratory effort. Examination patient has progressive dehiscence of the surgical incision.  There is drainage there is gangrenous necrotic tissue.  The wound is approximately 2 cm wide entire length of the incision there is no depth to the wound.  There is no ascending cellulitis.  There is no purulent drainage.  No exposed bone or tendon.  Imaging: No results found. No images are attached to the encounter.  Labs: Lab Results  Component Value Date   HGBA1C 9.1 (H) 02/18/2017   HGBA1C 9.8 (H) 11/28/2015   HGBA1C 9.7 (H) 12/26/2013   ESRSEDRATE 120 (H) 11/13/2015   CRP 35.4 (H) 11/13/2015   REPTSTATUS 12/06/2015 FINAL 11/30/2015   REPTSTATUS 11/30/2015 FINAL 11/30/2015   GRAMSTAIN   11/30/2015    WBC PRESENT,BOTH PMN AND MONONUCLEAR NO ORGANISMS SEEN CYTOSPIN SMEAR    CULT NO GROWTH 5 DAYS 11/30/2015   LABORGA STREPTOCOCCUS PNEUMONIAE 11/12/2015     Lab Results  Component Value Date   ALBUMIN 2.0 (L) 06/18/2017   ALBUMIN 2.2 (L) 06/16/2017   ALBUMIN 2.3 (L) 06/13/2017    Body mass index is 27.12 kg/m.  Orders:  No orders of the defined types were placed in this encounter.  No orders of the defined types were placed in this encounter.    Procedures: No procedures performed  Clinical Data: No additional findings.  ROS:  All other systems negative, except as noted in the HPI. Review of Systems  Objective: Vital Signs: Ht 6\' 3"  (1.905 m)   Wt 217 lb (98.4 kg)   BMI 27.12 kg/m   Specialty Comments:  No specialty comments available.  PMFS History: Patient Active Problem List   Diagnosis Date Noted  . Dehiscence of amputation stump ([place]) 07/17/2017  . Benign essential HTN   . Chronic diastolic (congestive) heart failure ([place])   . Labile blood glucose   . Labile blood pressure   . Vomiting   . Phantom limb pain ([place])   . Poorly controlled type 2 diabetes mellitus with peripheral neuropathy ([place])   . Essential hypertension   . Unilateral complete BKA, right, sequela ([place])   .  Amputation of right lower extremity below knee ([place]) 06/10/2017  . ESRD on dialysis ([place])   . Chronic diastolic congestive heart failure ([place])   . History of PSVT (paroxysmal supraventricular tachycardia)   . Diabetes mellitus type 2 in nonobese (HCC)   . Post-operative pain   . Acute blood loss anemia   . Anemia of chronic disease   . Leukocytosis   . Tachycardia   . History of right below knee amputation ([place]) 06/06/2017  . Gangrene of right foot ([place])   . PVD (peripheral vascular disease) ([place]) 05/13/2017  . Bilateral pleural effusion 11/28/2015  . Loculated pleural effusion 11/28/2015  . HCAP (healthcare-associated pneumonia) 11/28/2015  . ESRD (end  stage renal disease) on dialysis ([place]) 11/12/2015  . Hyperkalemia 11/12/2015  . Shoulder pain, acute 11/12/2015  . Nausea & vomiting 11/12/2015  . Diarrhea 11/12/2015  . Hypertensive urgency 05/05/2014  . Nephrotic syndrome 12/29/2013  . Hyponatremia 12/28/2013  . Acute diastolic heart failure ([place]) 12/28/2013  . Hypoglycemia 12/28/2013  . Dyspnea   . Acute renal failure syndrome ([place])   . Diabetic ketoacidosis without coma associated with type 1 diabetes mellitus ([place])   . Demand ischemia ([place]) 12/24/2013  . DKA (diabetic ketoacidoses) ([place]) 12/23/2013  . CAP (community acquired pneumonia) 12/23/2013  . Sepsis due to pneumonia ([place]) 12/23/2013  . Metabolic acidemia 96/28/3662  . Acute renal failure ([place]) 12/23/2013  . Diabetic ketoacidosis ([place]) 12/23/2013  . Chronic diastolic heart failure ([place]) 04/14/2013  . PSVT (paroxysmal supraventricular tachycardia) ([place]) 04/14/2013  . Bilateral leg edema 03/26/2013  . Chronic kidney disease, stage III (moderate) ([place]) 09/29/2011  . Type 1 diabetes mellitus with end-stage renal disease ([place]) 09/26/2010  . Essential hypertension, benign 09/26/2010  . Hyperlipidemia 09/26/2010   Past Medical History:  Diagnosis Date  . Aortic stenosis    Very mild in 05/2007  . Arthritis    "right shoulder; right knee; feel like I've got it all over" (11/28/2015)  . CHF (congestive heart failure) ([place])   . Degenerative joint disease    Left TKA  . Diastolic heart failure (HCC)    LVEF 65-70% with grade 2 diastolic dysfunction  . ESRD (end stage renal disease) on dialysis ([place])    "MWF; [facility] [address]" (11/28/2015)  . Essential hypertension, benign 2000   LVH  . GERD (gastroesophageal reflux disease)   . HCAP (healthcare-associated pneumonia) 11/28/2015  . Hyperlipidemia 2000  . Loculated pleural effusion 11/28/2015   [name] 11/28/2015  . Pneumonia 12/2013; 05/2014  . PSVT (paroxysmal supraventricular tachycardia) (HCC)    Nonsustained;  asymptomatic; diagnosed by event recorder in 2006  . Tobacco abuse, in remission    20 pack years; discontinued in 1985; bronchitic changes on chest x-ray and 2009  . Type 1 diabetes mellitus ([place]) 1990    Family History  Problem Relation Age of Onset  . Diabetes Father   . Hypertension Father   . Diabetes Mother   . Hypertension Mother     Past Surgical History:  Procedure Laterality Date  . AMPUTATION Right 06/06/2017   Procedure: RIGHT BELOW KNEE AMPUTATION;  Surgeon: [name], MD;  Location: [place];  Service: Orthopedics;  Laterality: Right;  . AV FISTULA PLACEMENT Right 06/23/2014   Procedure: ARTERIOVENOUS (AV) FISTULA CREATION;  Surgeon: Angelia Mould, MD;  Location: [place];  Service: Vascular;  Laterality: Right;  . CARDIAC CATHETERIZATION     [facility]- evaluation for Kidney/ pancreas transplant  . EYE SURGERY Bilateral    "for  glaucoma"  . INGUINAL HERNIA REPAIR Right    As a child  . LOWER EXTREMITY ANGIOGRAPHY N/A 05/27/2017   Procedure: LOWER EXTREMITY ANGIOGRAPHY;  Surgeon: [name], MD;  Location: [facility];  Service: Cardiovascular;  Laterality: N/A;  . WISDOM TOOTH EXTRACTION  1987   Social History   Occupational History  . Not on file  Tobacco Use  . Smoking status: Never Smoker  . Smokeless tobacco: Never Used  Substance and Sexual Activity  . Alcohol use: Yes    Comment: 11/28/2015 "used to drink; stopped ~ 2 yr ago"  . Drug use: No  . Sexual activity: Yes

## 2017-07-18 DIAGNOSIS — N2581 Secondary hyperparathyroidism of renal origin: Secondary | ICD-10-CM | POA: Diagnosis not present

## 2017-07-18 DIAGNOSIS — N186 End stage renal disease: Secondary | ICD-10-CM | POA: Diagnosis not present

## 2017-07-19 DIAGNOSIS — Z4781 Encounter for orthopedic aftercare following surgical amputation: Secondary | ICD-10-CM | POA: Diagnosis not present

## 2017-07-19 DIAGNOSIS — N186 End stage renal disease: Secondary | ICD-10-CM | POA: Diagnosis not present

## 2017-07-19 DIAGNOSIS — E1051 Type 1 diabetes mellitus with diabetic peripheral angiopathy without gangrene: Secondary | ICD-10-CM | POA: Diagnosis not present

## 2017-07-19 DIAGNOSIS — M19011 Primary osteoarthritis, right shoulder: Secondary | ICD-10-CM | POA: Diagnosis not present

## 2017-07-19 DIAGNOSIS — M1711 Unilateral primary osteoarthritis, right knee: Secondary | ICD-10-CM | POA: Diagnosis not present

## 2017-07-19 DIAGNOSIS — E1022 Type 1 diabetes mellitus with diabetic chronic kidney disease: Secondary | ICD-10-CM | POA: Diagnosis not present

## 2017-07-19 DIAGNOSIS — D631 Anemia in chronic kidney disease: Secondary | ICD-10-CM | POA: Diagnosis not present

## 2017-07-19 DIAGNOSIS — E1042 Type 1 diabetes mellitus with diabetic polyneuropathy: Secondary | ICD-10-CM | POA: Diagnosis not present

## 2017-07-19 DIAGNOSIS — I132 Hypertensive heart and chronic kidney disease with heart failure and with stage 5 chronic kidney disease, or end stage renal disease: Secondary | ICD-10-CM | POA: Diagnosis not present

## 2017-07-19 DIAGNOSIS — Z992 Dependence on renal dialysis: Secondary | ICD-10-CM | POA: Diagnosis not present

## 2017-07-19 DIAGNOSIS — F17200 Nicotine dependence, unspecified, uncomplicated: Secondary | ICD-10-CM | POA: Diagnosis not present

## 2017-07-19 DIAGNOSIS — I471 Supraventricular tachycardia: Secondary | ICD-10-CM | POA: Diagnosis not present

## 2017-07-19 DIAGNOSIS — I5032 Chronic diastolic (congestive) heart failure: Secondary | ICD-10-CM | POA: Diagnosis not present

## 2017-07-21 ENCOUNTER — Other Ambulatory Visit: Payer: Self-pay

## 2017-07-21 DIAGNOSIS — N2581 Secondary hyperparathyroidism of renal origin: Secondary | ICD-10-CM | POA: Diagnosis not present

## 2017-07-21 DIAGNOSIS — N186 End stage renal disease: Secondary | ICD-10-CM | POA: Diagnosis not present

## 2017-07-21 MED ORDER — FREESTYLE LIBRE 14 DAY SENSOR MISC: 1.0000 | 5 refills | Status: DC

## 2017-07-22 DIAGNOSIS — E1042 Type 1 diabetes mellitus with diabetic polyneuropathy: Secondary | ICD-10-CM | POA: Diagnosis not present

## 2017-07-22 DIAGNOSIS — I471 Supraventricular tachycardia: Secondary | ICD-10-CM | POA: Diagnosis not present

## 2017-07-22 DIAGNOSIS — I132 Hypertensive heart and chronic kidney disease with heart failure and with stage 5 chronic kidney disease, or end stage renal disease: Secondary | ICD-10-CM | POA: Diagnosis not present

## 2017-07-22 DIAGNOSIS — E1022 Type 1 diabetes mellitus with diabetic chronic kidney disease: Secondary | ICD-10-CM | POA: Diagnosis not present

## 2017-07-22 DIAGNOSIS — F17200 Nicotine dependence, unspecified, uncomplicated: Secondary | ICD-10-CM | POA: Diagnosis not present

## 2017-07-22 DIAGNOSIS — Z992 Dependence on renal dialysis: Secondary | ICD-10-CM | POA: Diagnosis not present

## 2017-07-22 DIAGNOSIS — E1051 Type 1 diabetes mellitus with diabetic peripheral angiopathy without gangrene: Secondary | ICD-10-CM | POA: Diagnosis not present

## 2017-07-22 DIAGNOSIS — D631 Anemia in chronic kidney disease: Secondary | ICD-10-CM | POA: Diagnosis not present

## 2017-07-22 DIAGNOSIS — N186 End stage renal disease: Secondary | ICD-10-CM | POA: Diagnosis not present

## 2017-07-22 DIAGNOSIS — M1711 Unilateral primary osteoarthritis, right knee: Secondary | ICD-10-CM | POA: Diagnosis not present

## 2017-07-22 DIAGNOSIS — I5032 Chronic diastolic (congestive) heart failure: Secondary | ICD-10-CM | POA: Diagnosis not present

## 2017-07-22 DIAGNOSIS — N2581 Secondary hyperparathyroidism of renal origin: Secondary | ICD-10-CM | POA: Diagnosis not present

## 2017-07-22 DIAGNOSIS — Z4781 Encounter for orthopedic aftercare following surgical amputation: Secondary | ICD-10-CM | POA: Diagnosis not present

## 2017-07-22 DIAGNOSIS — M19011 Primary osteoarthritis, right shoulder: Secondary | ICD-10-CM | POA: Diagnosis not present

## 2017-07-23 ENCOUNTER — Other Ambulatory Visit (INDEPENDENT_AMBULATORY_CARE_PROVIDER_SITE_OTHER): Payer: Self-pay | Admitting: Family

## 2017-07-23 ENCOUNTER — Ambulatory Visit (INDEPENDENT_AMBULATORY_CARE_PROVIDER_SITE_OTHER): Payer: BLUE CROSS/BLUE SHIELD | Admitting: "Endocrinology

## 2017-07-23 ENCOUNTER — Encounter: Payer: Self-pay | Admitting: "Endocrinology

## 2017-07-23 VITALS — BP 173/99 | HR 106

## 2017-07-23 DIAGNOSIS — N186 End stage renal disease: Secondary | ICD-10-CM

## 2017-07-23 DIAGNOSIS — I10 Essential (primary) hypertension: Secondary | ICD-10-CM

## 2017-07-23 DIAGNOSIS — E1022 Type 1 diabetes mellitus with diabetic chronic kidney disease: Secondary | ICD-10-CM

## 2017-07-23 DIAGNOSIS — E782 Mixed hyperlipidemia: Secondary | ICD-10-CM

## 2017-07-23 DIAGNOSIS — I70243 Atherosclerosis of native arteries of left leg with ulceration of ankle: Secondary | ICD-10-CM

## 2017-07-23 NOTE — Patient Instructions (Signed)

## 2017-07-23 NOTE — Progress Notes (Signed)
Endocrinology follow-up note       07/23/2017, 4:58 PM   Subjective:    Patient ID: Lance [name], male    DOB: 1970/11/08.  Lance [name] is being seen in follow-up  for management of currently uncontrolled symptomatic diabetes requested by  [name], MD.   Past Medical History:  Diagnosis Date  . Aortic stenosis    Very mild in 05/2007  . Arthritis    "right shoulder; right knee; feel like I've got it all over" (11/28/2015)  . CHF (congestive heart failure) ([place])   . Degenerative joint disease    Left TKA  . Diastolic heart failure (HCC)    LVEF 65-70% with grade 2 diastolic dysfunction  . ESRD (end stage renal disease) on dialysis ([place])    "MWF; [facility] on [address]" (11/28/2015)  . Essential hypertension, benign 2000   LVH  . GERD (gastroesophageal reflux disease)   . HCAP (healthcare-associated pneumonia) 11/28/2015  . Hyperlipidemia 2000  . Loculated pleural effusion 11/28/2015   Lance [name] 11/28/2015  . Pneumonia 12/2013; 05/2014  . PSVT (paroxysmal supraventricular tachycardia) (HCC)    Nonsustained; asymptomatic; diagnosed by event recorder in 2006  . Tobacco abuse, in remission    20 pack years; discontinued in 1985; bronchitic changes on chest x-ray and 2009  . Type 1 diabetes mellitus ([place]) 1990   Past Surgical History:  Procedure Laterality Date  . AMPUTATION Right 06/06/2017   Procedure: RIGHT BELOW KNEE AMPUTATION;  Surgeon: [name], MD;  Location: [facility];  Service: Orthopedics;  Laterality: Right;  . AV FISTULA PLACEMENT Right 06/23/2014   Procedure: ARTERIOVENOUS (AV) FISTULA CREATION;  Surgeon: Angelia Mould, MD;  Location: [facility];  Service: Vascular;  Laterality: Right;  . CARDIAC CATHETERIZATION     [facility]- evaluation for Kidney/ pancreas transplant  . EYE SURGERY Bilateral    "for glaucoma"  . INGUINAL HERNIA REPAIR Right    As a child  . LOWER  EXTREMITY ANGIOGRAPHY N/A 05/27/2017   Procedure: LOWER EXTREMITY ANGIOGRAPHY;  Surgeon: [name], MD;  Location: [facility];  Service: Cardiovascular;  Laterality: N/A;  . WISDOM TOOTH EXTRACTION  1987   Social History   Socioeconomic History  . Marital status: Married    Spouse name: Not on file  . Number of children: Not on file  . Years of education: Not on file  . Highest education level: Not on file  Occupational History  . Not on file  Social Needs  . Financial resource strain: Not on file  . Food insecurity:    Worry: Not on file    Inability: Not on file  . Transportation needs:    Medical: Not on file    Non-medical: Not on file  Tobacco Use  . Smoking status: Never Smoker  . Smokeless tobacco: Never Used  Substance and Sexual Activity  . Alcohol use: Yes    Comment: 11/28/2015 "used to drink; stopped ~ 2 yr ago"  . Drug use: No  . Sexual activity: Yes  Lifestyle  . Physical activity:    Days per week: Not on file  Minutes per session: Not on file  . Stress: Not on file  Relationships  . Social connections:    Talks on phone: Not on file    Gets together: Not on file    Attends religious service: Not on file    Active member of club or organization: Not on file    Attends meetings of clubs or organizations: Not on file    Relationship status: Not on file  Other Topics Concern  . Not on file  Social History Narrative  . Not on file   Outpatient Encounter Medications as of 07/23/2017  Medication Sig  . aspirin EC 325 MG EC tablet Take 1 tablet (325 mg total) by mouth daily.  . calcitRIOL (ROCALTROL) 0.25 MCG capsule Take 1 capsule (0.25 mcg total) by mouth every Monday, Wednesday, and Friday with hemodialysis.  Marland Kitchen calcium acetate (PHOSLO) 667 MG capsule Take 1 capsule (667 mg total) by mouth 3 (three) times daily.  . carvedilol (COREG) 3.125 MG tablet Take 1 tablet (3.125 mg total) by mouth 2 (two) times daily with a meal.  . Continuous  Blood Gluc Sensor (FREESTYLE LIBRE 14 DAY SENSOR) MISC 1 each by Does not apply route every 14 (fourteen) days.  . Darbepoetin Alfa (ARANESP) 60 MCG/0.3ML SOSY injection Inject 0.3 mLs (60 mcg total) into the vein every Monday with hemodialysis. (Patient not taking: Reported on 07/22/2017)  . docusate sodium (COLACE) 100 MG capsule Take 1 capsule (100 mg total) by mouth 2 (two) times daily. (Patient taking differently: Take 200 mg by mouth daily. )  . gabapentin (NEURONTIN) 100 MG capsule Take 1 capsule (100 mg total) by mouth daily.  Marland Kitchen gabapentin (NEURONTIN) 300 MG capsule Take 1 capsule (300 mg total) by mouth every Monday, Wednesday, and Friday with hemodialysis.  Marland Kitchen insulin glargine (LANTUS) 100 unit/mL SOPN Inject 0.25 mLs (25 Units total) into the skin at bedtime.  . Insulin Lispro (HUMALOG KWIKPEN [place]) Inject 15-20 Units into the skin 3 (three) times daily before meals.   . methocarbamol (ROBAXIN) 500 MG tablet Take 1 tablet (500 mg total) by mouth every 6 (six) hours as needed for muscle spasms. (Patient not taking: Reported on 07/22/2017)  . ondansetron (ZOFRAN ODT) 4 MG disintegrating tablet Take 1 tablet (4 mg total) by mouth every 8 (eight) hours as needed for nausea or vomiting.  Marland Kitchen oxyCODONE-acetaminophen (PERCOCET/ROXICET) 5-325 MG tablet Take 1 tablet by mouth daily as needed for severe pain.  . pantoprazole (PROTONIX) 40 MG tablet Take 1 tablet (40 mg total) by mouth daily.  . pentoxifylline (TRENTAL) 400 MG CR tablet Take 1 tablet (400 mg total) by mouth 3 (three) times daily with meals.  . polyethylene glycol (MIRALAX) packet Take 17 g by mouth daily.  . silver sulfADIAZINE (SILVADENE) 1 % cream Apply 1 application topically daily. (Patient taking differently: Apply 1 application topically every other day. )  . sucroferric oxyhydroxide (VELPHORO) 500 MG chewable tablet Chew 3 tablets (1,500 mg total) by mouth 3 (three) times daily.   No facility-administered encounter medications on  file as of 07/23/2017.     ALLERGIES: Allergies  Allergen Reactions  . Atorvastatin Other (See Comments)    Myalgias   . Minoxidil Other (See Comments)    Fluid retention     VACCINATION STATUS: Immunization History  Administered Date(s) Administered  . Influenza,inj,Quad PF,6+ Mos 05/06/2014  . Pneumococcal Polysaccharide-23 05/06/2014    Diabetes  He presents for his follow-up diabetic visit. He has type 1 diabetes mellitus. Onset  time: He was diagnosed at approximate age of 33 years with type 1 diabetes. His disease course has been improving. There are no hypoglycemic associated symptoms. Pertinent negatives for hypoglycemia include no confusion, headaches, pallor or seizures. Pertinent negatives for diabetes include no chest pain, no fatigue, no polydipsia, no polyphagia, no polyuria and no weakness. There are no hypoglycemic complications. Symptoms are improving. Diabetic complications include nephropathy and PVD. (His diabetes is complicated by end-stage renal disease on  hemodialysis for the last 5 years.  Since last visit, he underwent right below-knee amputation due to peripheral arterial disease.  Patient is  on  Kidney transplant list.) Risk factors for coronary artery disease include diabetes mellitus, hypertension and sedentary lifestyle. Current diabetic treatment includes insulin injections. His weight is decreasing steadily (Status post right BKA.). He is following a generally unhealthy diet. He has had a previous visit with a dietitian. He never participates in exercise. His home blood glucose trend is fluctuating minimally. His breakfast blood glucose range is generally 140-180 mg/dl. His lunch blood glucose range is generally 140-180 mg/dl. His dinner blood glucose range is generally 140-180 mg/dl. His bedtime blood glucose range is generally 140-180 mg/dl. His overall blood glucose range is 140-180 mg/dl. ( his  electronic medical records show A1c's of 9.8%, 9.7%, and 11.2%  between 2015 - 2017.) An ACE inhibitor/angiotensin II receptor blocker is being taken. Eye exam is current.  Hypertension  This is a chronic problem. The current episode started more than 1 year ago. The problem is controlled. Pertinent negatives include no chest pain, headaches, neck pain, palpitations or shortness of breath. Risk factors for coronary artery disease include diabetes mellitus and sedentary lifestyle. Past treatments include ACE inhibitors. Hypertensive end-organ damage includes kidney disease and PVD.     Review of Systems  Constitutional: Negative for chills, fatigue, fever and unexpected weight change.  HENT: Negative for dental problem, mouth sores and trouble swallowing.   Eyes: Negative for visual disturbance.  Respiratory: Negative for cough, choking, chest tightness, shortness of breath and wheezing.   Cardiovascular: Negative for chest pain, palpitations and leg swelling.  Gastrointestinal: Negative for abdominal distention, abdominal pain, constipation, diarrhea, nausea and vomiting.  Endocrine: Negative for polydipsia, polyphagia and polyuria.  Genitourinary: Negative for dysuria, flank pain, hematuria and urgency.  Musculoskeletal: Negative for back pain, gait problem, myalgias and neck pain.  Skin: Negative for pallor, rash and wound.  Neurological: Negative for seizures, syncope, weakness, numbness and headaches.  Psychiatric/Behavioral: Negative for confusion and dysphoric mood.    Objective:    BP (!) 173/99   Pulse (!) 106   Wt Readings from Last 3 Encounters:  07/17/17 217 lb (98.4 kg)  07/03/17 217 lb (98.4 kg)  07/02/17 216 lb (98 kg)     Physical Exam  Constitutional: He is oriented to person, place, and time. He appears well-developed. He is cooperative. No distress.  HENT:  Head: Normocephalic and atraumatic.  Eyes: EOM are normal.  Neck: Normal range of motion. Neck supple. No tracheal deviation present. No thyromegaly present.   Cardiovascular: Normal rate, S1 normal and S2 normal. Exam reveals no gallop.  No murmur heard. Pulses:      Dorsalis pedis pulses are 1+ on the right side, and 1+ on the left side.       Posterior tibial pulses are 1+ on the right side, and 1+ on the left side.  Pulmonary/Chest: Effort normal. No respiratory distress. He has no wheezes.  Abdominal: He exhibits no distension. There  is no tenderness. There is no guarding and no CVA tenderness.  Musculoskeletal: He exhibits no edema.       Right shoulder: He exhibits no swelling and no deformity.  Patient on a wheelchair due to right below-knee amputation from peripheral arterial disease as a complication of diabetes.   Neurological: He is alert and oriented to person, place, and time. He has normal strength. No cranial nerve deficit or sensory deficit. Gait normal.  Skin: Skin is warm and dry. No rash noted. No cyanosis. Nails show no clubbing.  Psychiatric: He has a normal mood and affect. His speech is normal and behavior is normal. Cognition and memory are normal.   CMP ( most recent) CMP     Component Value Date/Time   NA 136 06/18/2017 0807   NA 131 (L) 02/18/2017 1438   K 4.0 06/18/2017 0807   CL 93 (L) 06/18/2017 0807   CO2 30 06/18/2017 0807   GLUCOSE 87 06/18/2017 0807   BUN 53 (H) 06/18/2017 0807   BUN 46 (H) 02/18/2017 1438   CREATININE 11.04 (H) 06/18/2017 0807   CREATININE 2.21 (H) 03/26/2013 1520   CALCIUM 8.3 (L) 06/18/2017 0807   PROT 8.3 02/18/2017 1438   ALBUMIN 2.0 (L) 06/18/2017 0807   ALBUMIN 4.4 02/18/2017 1438   AST 15 02/18/2017 1438   ALT 24 02/18/2017 1438   ALKPHOS 161 (H) 02/18/2017 1438   BILITOT 0.5 02/18/2017 1438   GFRNONAA 5 (L) 06/18/2017 0807   GFRAA 6 (L) 06/18/2017 0807     Diabetic Labs (most recent): Lab Results  Component Value Date   HGBA1C 9.1 (H) 02/18/2017   HGBA1C 9.8 (H) 11/28/2015   HGBA1C 9.7 (H) 12/26/2013     Lipid Panel ( most recent) Lipid Panel     Component  Value Date/Time   CHOL 174 12/28/2013 0707   TRIG 229 (H) 12/28/2013 0707   HDL 39 (L) 12/28/2013 0707   CHOLHDL 4.5 12/28/2013 0707   VLDL 46 (H) 12/28/2013 0707   LDLCALC 89 12/28/2013 0707      Lab Results  Component Value Date   TSH 1.390 02/18/2017   TSH 1.760 12/26/2013   FREET4 1.00 02/18/2017     Assessment & Plan:   1. Type 1 diabetes mellitus with end-stage renal disease ([place])  - Lance [name] has currently uncontrolled symptomatic type 1 DM since  47 years of age. - His diabetes course is getting more complicated,   most recent documented A1c of 9.6%. -Recently he underwent right BKA due to peripheral arterial disease.   -He returns with improved glycemic profile, CGM glucose patterns summary shows 68% of the time he is in range, 21% above range, and 11% hypoglycemic range.  His hypoglycemia usually happens in the evening hours.  -his diabetes is complicated by end-stage renal disease on hemodialysis/kidney transplant list, PAD, sedentary life and Lance [name] remains at a high risk for more acute and chronic complications which include CAD, CVA, retinopathy, and neuropathy. These are all discussed in detail with the patient.  - I have counseled him on diet management  by adopting a carbohydrate restricted/protein rich diet.  -  Suggestion is made for him to avoid simple carbohydrates  from his diet including Cakes, Sweet Desserts / Pastries, Ice Cream, Soda (diet and regular), Sweet Tea, Candies, Chips, Cookies, Store Bought Juices, Alcohol in Excess of  1-2 drinks a day, Artificial Sweeteners, and "Sugar-free" Products. This will help patient to have stable blood  glucose profile and potentially avoid unintended weight gain.   - I encouraged him to switch to  unprocessed or minimally processed complex starch and increased protein intake (animal or plant source), fruits, and vegetables.  - he is advised to stick to a routine mealtimes to eat 3 meals  a day  and avoid unnecessary snacks ( to snack only to correct hypoglycemia).   - he has been scheduled with Lance [name], RDN, CDE for individualized diabetes education- consult pending.  - I have approached him with the following individualized plan to manage diabetes and patient agrees:   - Given his chronic glycemic burden , he will continue to require intensive treatment with basal/bolus insulin.   - Patient reports history of uncontrolled diabetes and hypertension as a cause of ESRD.  -His fasting blood glucose profile has improved, advised to continue Lantus 25 units nightly.   -Due to evening hypoglycemic episodes, I advised him to lower his supper time Humalog to 10 units, continue breakfast and lunch Humalog doses at 15 units for pre-meal BG readings of 90-150mg /dl, plus patient specific correction dose for unexpected hyperglycemia above 150mg /dl, associated with strict documenting of  glucose 4 times a day-before meals and at bedtime. - Patient is warned not to take insulin without proper monitoring per orders. -Adjustment parameters are given for hypo and hyperglycemia in writing. -Patient is encouraged to call clinic for blood glucose levels less than 70 or above 300 mg /dl.  - Insulin is the only choice of therapy for him. He is benefiting from CGM, I advised him to wear it at all times.   - Patient specific target  A1c;  LDL, HDL, Triglycerides, and  Waist Circumference were discussed in detail.  2) BP/HTN: His blood pressure is not controlled to target.  He is advised to continue his Coreg 3.125 mg p.o. twice daily, and address at dialysis for fluid balance.   3) Lipids/HPL: Recent  Lipid panel   shows LDL at 89. He  is marked as allergic to statins.   4)  Weight/Diet: CDE Consult has been  initiated , exercise, and detailed carbohydrates information provided.  5) Chronic Care/Health Maintenance:  -he  is on ACEI medications and  is encouraged to continue to follow up with  Ophthalmology, Dentist, nephrology,  Podiatrist at least yearly or according to recommendations, and advised to  stay away from smoking. I have recommended yearly flu vaccine and pneumonia vaccination at least every 5 years; moderate intensity exercise for up to 150 minutes weekly; and  sleep for at least 7 hours a day.  - I advised patient to maintain close follow up with [name], MD for primary care needs.  - Time spent with the patient: 25 min, of which >50% was spent in reviewing his blood glucose logs , discussing his hypo- and hyper-glycemic episodes, reviewing his current and  previous labs and insulin doses and developing a plan to avoid hypo- and hyper-glycemia. Please refer to Patient Instructions for Blood Glucose Monitoring and Insulin/Medications Dosing Guide"  in media tab for additional information. Lance [name] participated in the discussions, expressed understanding, and voiced agreement with the above plans.  All questions were answered to his satisfaction. he is encouraged to contact clinic should he have any questions or concerns prior to his return visit.  Follow up plan: - Return in about 3 months (around 10/23/2017) for follow up with pre-visit labs, meter, and logs.  Glade Lloyd, MD [facility] [address] [place], [place] [address] Phone: [phone]  Fax: [phone]    07/23/2017, 4:58 PM  This note was partially dictated with voice recognition software. Similar sounding words can be transcribed inadequately or may not  be corrected upon review.

## 2017-07-24 ENCOUNTER — Ambulatory Visit (INDEPENDENT_AMBULATORY_CARE_PROVIDER_SITE_OTHER): Payer: BLUE CROSS/BLUE SHIELD | Admitting: Orthopedic Surgery

## 2017-07-24 DIAGNOSIS — Z992 Dependence on renal dialysis: Secondary | ICD-10-CM | POA: Diagnosis not present

## 2017-07-24 DIAGNOSIS — I5032 Chronic diastolic (congestive) heart failure: Secondary | ICD-10-CM | POA: Diagnosis not present

## 2017-07-24 DIAGNOSIS — Z4781 Encounter for orthopedic aftercare following surgical amputation: Secondary | ICD-10-CM | POA: Diagnosis not present

## 2017-07-24 DIAGNOSIS — I132 Hypertensive heart and chronic kidney disease with heart failure and with stage 5 chronic kidney disease, or end stage renal disease: Secondary | ICD-10-CM | POA: Diagnosis not present

## 2017-07-24 DIAGNOSIS — D631 Anemia in chronic kidney disease: Secondary | ICD-10-CM | POA: Diagnosis not present

## 2017-07-24 DIAGNOSIS — E1051 Type 1 diabetes mellitus with diabetic peripheral angiopathy without gangrene: Secondary | ICD-10-CM | POA: Diagnosis not present

## 2017-07-24 DIAGNOSIS — E1022 Type 1 diabetes mellitus with diabetic chronic kidney disease: Secondary | ICD-10-CM | POA: Diagnosis not present

## 2017-07-24 DIAGNOSIS — F17200 Nicotine dependence, unspecified, uncomplicated: Secondary | ICD-10-CM | POA: Diagnosis not present

## 2017-07-24 DIAGNOSIS — I471 Supraventricular tachycardia: Secondary | ICD-10-CM | POA: Diagnosis not present

## 2017-07-24 DIAGNOSIS — N2581 Secondary hyperparathyroidism of renal origin: Secondary | ICD-10-CM | POA: Diagnosis not present

## 2017-07-24 DIAGNOSIS — S88111A Complete traumatic amputation at level between knee and ankle, right lower leg, initial encounter: Secondary | ICD-10-CM | POA: Diagnosis not present

## 2017-07-24 DIAGNOSIS — E1042 Type 1 diabetes mellitus with diabetic polyneuropathy: Secondary | ICD-10-CM | POA: Diagnosis not present

## 2017-07-24 DIAGNOSIS — M19011 Primary osteoarthritis, right shoulder: Secondary | ICD-10-CM | POA: Diagnosis not present

## 2017-07-24 DIAGNOSIS — M1711 Unilateral primary osteoarthritis, right knee: Secondary | ICD-10-CM | POA: Diagnosis not present

## 2017-07-24 DIAGNOSIS — N186 End stage renal disease: Secondary | ICD-10-CM | POA: Diagnosis not present

## 2017-07-25 ENCOUNTER — Ambulatory Visit (HOSPITAL_COMMUNITY): Payer: BLUE CROSS/BLUE SHIELD | Admitting: Anesthesiology

## 2017-07-25 ENCOUNTER — Other Ambulatory Visit: Payer: Self-pay

## 2017-07-25 ENCOUNTER — Encounter (HOSPITAL_COMMUNITY): Payer: Self-pay | Admitting: *Deleted

## 2017-07-25 ENCOUNTER — Ambulatory Visit (HOSPITAL_COMMUNITY)
Admission: RE | Admit: 2017-07-25 | Discharge: 2017-07-25 | Disposition: A | Discharge status: Home / Self Care | Payer: BLUE CROSS/BLUE SHIELD | Source: Ambulatory Visit | Attending: Orthopedic Surgery | Admitting: Orthopedic Surgery

## 2017-07-25 ENCOUNTER — Encounter (HOSPITAL_COMMUNITY)
Admission: RE | Disposition: A | Discharge status: Home / Self Care | Payer: Self-pay | Source: Ambulatory Visit | Attending: Orthopedic Surgery

## 2017-07-25 DIAGNOSIS — E1022 Type 1 diabetes mellitus with diabetic chronic kidney disease: Secondary | ICD-10-CM | POA: Diagnosis not present

## 2017-07-25 DIAGNOSIS — I5032 Chronic diastolic (congestive) heart failure: Secondary | ICD-10-CM | POA: Insufficient documentation

## 2017-07-25 DIAGNOSIS — Z794 Long term (current) use of insulin: Secondary | ICD-10-CM | POA: Insufficient documentation

## 2017-07-25 DIAGNOSIS — Z79899 Other long term (current) drug therapy: Secondary | ICD-10-CM | POA: Insufficient documentation

## 2017-07-25 DIAGNOSIS — E1051 Type 1 diabetes mellitus with diabetic peripheral angiopathy without gangrene: Secondary | ICD-10-CM | POA: Diagnosis not present

## 2017-07-25 DIAGNOSIS — I5033 Acute on chronic diastolic (congestive) heart failure: Secondary | ICD-10-CM | POA: Diagnosis not present

## 2017-07-25 DIAGNOSIS — Z7982 Long term (current) use of aspirin: Secondary | ICD-10-CM | POA: Insufficient documentation

## 2017-07-25 DIAGNOSIS — Z89511 Acquired absence of right leg below knee: Secondary | ICD-10-CM | POA: Insufficient documentation

## 2017-07-25 DIAGNOSIS — T8781 Dehiscence of amputation stump: Secondary | ICD-10-CM | POA: Insufficient documentation

## 2017-07-25 DIAGNOSIS — I132 Hypertensive heart and chronic kidney disease with heart failure and with stage 5 chronic kidney disease, or end stage renal disease: Secondary | ICD-10-CM | POA: Insufficient documentation

## 2017-07-25 DIAGNOSIS — K219 Gastro-esophageal reflux disease without esophagitis: Secondary | ICD-10-CM | POA: Diagnosis not present

## 2017-07-25 DIAGNOSIS — N186 End stage renal disease: Secondary | ICD-10-CM | POA: Insufficient documentation

## 2017-07-25 DIAGNOSIS — Z96652 Presence of left artificial knee joint: Secondary | ICD-10-CM | POA: Diagnosis not present

## 2017-07-25 DIAGNOSIS — Z87891 Personal history of nicotine dependence: Secondary | ICD-10-CM | POA: Insufficient documentation

## 2017-07-25 DIAGNOSIS — Z992 Dependence on renal dialysis: Secondary | ICD-10-CM | POA: Insufficient documentation

## 2017-07-25 DIAGNOSIS — Y835 Amputation of limb(s) as the cause of abnormal reaction of the patient, or of later complication, without mention of misadventure at the time of the procedure: Secondary | ICD-10-CM | POA: Insufficient documentation

## 2017-07-25 HISTORY — PX: APPLICATION OF WOUND VAC: SHX5189

## 2017-07-25 HISTORY — PX: STUMP REVISION: SHX6102

## 2017-07-25 LAB — POCT I-STAT 4, (NA,K, GLUC, HGB,HCT)
Glucose, Bld: 53 mg/dL — ABNORMAL LOW (ref 65–99)
HCT: 35 % — ABNORMAL LOW (ref 39.0–52.0)
Hemoglobin: 11.9 g/dL — ABNORMAL LOW (ref 13.0–17.0)
Potassium: 4.1 mmol/L (ref 3.5–5.1)
Sodium: 135 mmol/L (ref 135–145)

## 2017-07-25 LAB — GLUCOSE, CAPILLARY
GLUCOSE-CAPILLARY: 86 mg/dL (ref 65–99)
GLUCOSE-CAPILLARY: 109 mg/dL — AB (ref 65–99)
GLUCOSE-CAPILLARY: 86 mg/dL (ref 65–99)
Glucose-Capillary: 75 mg/dL (ref 65–99)
Glucose-Capillary: 45 mg/dL — ABNORMAL LOW (ref 65–99)

## 2017-07-25 LAB — HEMOGLOBIN A1C
Hgb A1c MFr Bld: 6.7 % — ABNORMAL HIGH (ref 4.8–5.6)
MEAN PLASMA GLUCOSE: 145.59 mg/dL

## 2017-07-25 SURGERY — REVISION, AMPUTATION SITE
Anesthesia: General | Site: Leg Lower | Laterality: Right

## 2017-07-25 MED ORDER — EPHEDRINE SULFATE 50 MG/ML IJ SOLN
INTRAMUSCULAR | Status: AC
  Filled 2017-07-25: qty 1

## 2017-07-25 MED ORDER — EPHEDRINE SULFATE-NACL 50-0.9 MG/10ML-% IV SOSY
PREFILLED_SYRINGE | INTRAVENOUS | Status: DC | PRN
  Administered 2017-07-25 (×2): 5 mg via INTRAVENOUS

## 2017-07-25 MED ORDER — LIDOCAINE 2% (20 MG/ML) 5 ML SYRINGE
INTRAMUSCULAR | Status: DC | PRN
  Administered 2017-07-25: 100 mg via INTRAVENOUS

## 2017-07-25 MED ORDER — DEXAMETHASONE SODIUM PHOSPHATE 10 MG/ML IJ SOLN
INTRAMUSCULAR | Status: AC
  Filled 2017-07-25: qty 1

## 2017-07-25 MED ORDER — ONDANSETRON HCL 4 MG/2ML IJ SOLN
INTRAMUSCULAR | Status: AC
  Filled 2017-07-25: qty 2

## 2017-07-25 MED ORDER — GLYCOPYRROLATE PF 0.2 MG/ML IJ SOSY
PREFILLED_SYRINGE | INTRAMUSCULAR | Status: DC | PRN
  Administered 2017-07-25: .1 mg via INTRAVENOUS

## 2017-07-25 MED ORDER — DEXTROSE 50 % IV SOLN
INTRAVENOUS | Status: AC
  Administered 2017-07-25: 25 mL
  Filled 2017-07-25: qty 50

## 2017-07-25 MED ORDER — CEFAZOLIN SODIUM-DEXTROSE 2-4 GM/100ML-% IV SOLN
2.0000 g | INTRAVENOUS | Status: AC
  Administered 2017-07-25: 2 g via INTRAVENOUS
  Filled 2017-07-25: qty 100

## 2017-07-25 MED ORDER — HYDROMORPHONE HCL 2 MG/ML IJ SOLN
0.2500 mg | INTRAMUSCULAR | Status: DC | PRN
  Administered 2017-07-25: 0.5 mg via INTRAVENOUS
  Administered 2017-07-25: 1 mg via INTRAVENOUS
  Administered 2017-07-25: 0.5 mg via INTRAVENOUS

## 2017-07-25 MED ORDER — FENTANYL CITRATE (PF) 100 MCG/2ML IJ SOLN
INTRAMUSCULAR | Status: DC | PRN
  Administered 2017-07-25: 50 ug via INTRAVENOUS

## 2017-07-25 MED ORDER — PHENYLEPHRINE 40 MCG/ML (10ML) SYRINGE FOR IV PUSH (FOR BLOOD PRESSURE SUPPORT)
PREFILLED_SYRINGE | INTRAVENOUS | Status: AC
  Filled 2017-07-25: qty 10

## 2017-07-25 MED ORDER — ONDANSETRON HCL 4 MG/2ML IJ SOLN
INTRAMUSCULAR | Status: DC | PRN
  Administered 2017-07-25: 4 mg via INTRAVENOUS

## 2017-07-25 MED ORDER — SODIUM CHLORIDE 0.9 % IV SOLN
Freq: Once | INTRAVENOUS | Status: AC
  Administered 2017-07-25: 08:00:00 via INTRAVENOUS

## 2017-07-25 MED ORDER — DEXAMETHASONE SODIUM PHOSPHATE 4 MG/ML IJ SOLN
INTRAMUSCULAR | Status: DC | PRN
  Administered 2017-07-25: 4 mg via INTRAVENOUS

## 2017-07-25 MED ORDER — DEXTROSE 50 % IV SOLN
INTRAVENOUS | Status: AC
  Filled 2017-07-25: qty 50

## 2017-07-25 MED ORDER — 0.9 % SODIUM CHLORIDE (POUR BTL) OPTIME
TOPICAL | Status: DC | PRN
Start: 2017-07-25 — End: 2017-07-25
  Administered 2017-07-25: 1000 mL

## 2017-07-25 MED ORDER — FENTANYL CITRATE (PF) 250 MCG/5ML IJ SOLN
INTRAMUSCULAR | Status: AC
  Filled 2017-07-25: qty 5

## 2017-07-25 MED ORDER — SODIUM CHLORIDE 0.9 % IV SOLN
INTRAVENOUS | Status: DC | PRN
  Administered 2017-07-25: 08:00:00 via INTRAVENOUS

## 2017-07-25 MED ORDER — MIDAZOLAM HCL 5 MG/5ML IJ SOLN
INTRAMUSCULAR | Status: DC | PRN
  Administered 2017-07-25: 2 mg via INTRAVENOUS

## 2017-07-25 MED ORDER — PROPOFOL 10 MG/ML IV BOLUS
INTRAVENOUS | Status: AC
  Filled 2017-07-25: qty 20

## 2017-07-25 MED ORDER — HYDROMORPHONE HCL 2 MG/ML IJ SOLN
INTRAMUSCULAR | Status: AC
  Filled 2017-07-25: qty 1

## 2017-07-25 MED ORDER — DEXTROSE 50 % IV SOLN
25.0000 mL | Freq: Once | INTRAVENOUS | Status: AC
  Administered 2017-07-25: 25 mL via INTRAVENOUS
  Filled 2017-07-25: qty 50

## 2017-07-25 MED ORDER — CHLORHEXIDINE GLUCONATE 4 % EX LIQD: 60.0000 mL | Freq: Once | CUTANEOUS | Status: DC

## 2017-07-25 MED ORDER — PROMETHAZINE HCL 25 MG/ML IJ SOLN: 6.2500 mg | INTRAMUSCULAR | Status: DC | PRN

## 2017-07-25 MED ORDER — OXYCODONE HCL 5 MG PO TABS
ORAL_TABLET | ORAL | Status: AC
  Administered 2017-07-25: 5 mg
  Filled 2017-07-25: qty 1

## 2017-07-25 MED ORDER — MIDAZOLAM HCL 2 MG/2ML IJ SOLN
INTRAMUSCULAR | Status: AC
  Filled 2017-07-25: qty 2

## 2017-07-25 MED ORDER — PROPOFOL 10 MG/ML IV BOLUS
INTRAVENOUS | Status: DC | PRN
  Administered 2017-07-25: 200 mg via INTRAVENOUS

## 2017-07-25 MED ORDER — OXYCODONE-ACETAMINOPHEN 5-325 MG PO TABS: 1.0000 | ORAL_TABLET | ORAL | 0 refills | Status: DC | PRN

## 2017-07-25 MED ORDER — PHENYLEPHRINE 40 MCG/ML (10ML) SYRINGE FOR IV PUSH (FOR BLOOD PRESSURE SUPPORT)
PREFILLED_SYRINGE | INTRAVENOUS | Status: DC | PRN
  Administered 2017-07-25 (×4): 80 ug via INTRAVENOUS
  Administered 2017-07-25 (×2): 120 ug via INTRAVENOUS

## 2017-07-25 MED ORDER — LIDOCAINE 2% (20 MG/ML) 5 ML SYRINGE
INTRAMUSCULAR | Status: AC
  Filled 2017-07-25: qty 5

## 2017-07-25 SURGICAL SUPPLY — 31 items
BLADE SAW RECIP 87.9 MT (BLADE) IMPLANT
BLADE SURG 21 STRL SS (BLADE) ×4 IMPLANT
COVER SURGICAL LIGHT HANDLE (MISCELLANEOUS) ×4 IMPLANT
DRAPE EXTREMITY T 121X128X90 (DRAPE) ×4 IMPLANT
DRAPE HALF SHEET 40X57 (DRAPES) ×4 IMPLANT
DRAPE INCISE IOBAN 66X45 STRL (DRAPES) ×4 IMPLANT
DRAPE U-SHAPE 47X51 STRL (DRAPES) ×8 IMPLANT
DRESSING PREVENA PLUS CUSTOM (GAUZE/BANDAGES/DRESSINGS) ×2 IMPLANT
DRSG PREVENA PLUS CUSTOM (GAUZE/BANDAGES/DRESSINGS) ×4
DRSG VAC ATS MED SENSATRAC (GAUZE/BANDAGES/DRESSINGS) ×4 IMPLANT
DURAPREP 26ML APPLICATOR (WOUND CARE) ×4 IMPLANT
ELECT REM PT RETURN 9FT ADLT (ELECTROSURGICAL) ×4
ELECTRODE REM PT RTRN 9FT ADLT (ELECTROSURGICAL) ×2 IMPLANT
GLOVE BIOGEL PI IND STRL 9 (GLOVE) ×2 IMPLANT
GLOVE BIOGEL PI INDICATOR 9 (GLOVE) ×2
GLOVE SURG ORTHO 9.0 STRL STRW (GLOVE) ×4 IMPLANT
GOWN STRL REUS W/ TWL XL LVL3 (GOWN DISPOSABLE) ×4 IMPLANT
GOWN STRL REUS W/TWL XL LVL3 (GOWN DISPOSABLE) ×4
KIT BASIN OR (CUSTOM PROCEDURE TRAY) ×4 IMPLANT
KIT DRSG PREVENA PLUS 7DAY 125 (MISCELLANEOUS) ×4 IMPLANT
KIT TURNOVER KIT B (KITS) ×4 IMPLANT
MANIFOLD NEPTUNE II (INSTRUMENTS) ×4 IMPLANT
NS IRRIG 1000ML POUR BTL (IV SOLUTION) ×4 IMPLANT
PACK GENERAL/GYN (CUSTOM PROCEDURE TRAY) ×4 IMPLANT
PAD ARMBOARD 7.5X6 YLW CONV (MISCELLANEOUS) ×4 IMPLANT
PAD NEG PRESSURE SENSATRAC (MISCELLANEOUS) IMPLANT
STAPLER VISISTAT 35W (STAPLE) ×8 IMPLANT
SUT ETHILON 2 0 PSLX (SUTURE) ×12 IMPLANT
SUT SILK 2 0 (SUTURE)
SUT SILK 2-0 18XBRD TIE 12 (SUTURE) IMPLANT
TOWEL OR 17X26 10 PK STRL BLUE (TOWEL DISPOSABLE) ×4 IMPLANT

## 2017-07-25 NOTE — Anesthesia Preprocedure Evaluation (Signed)
Anesthesia Evaluation  Patient identified by MRN, date of birth, ID band Patient awake    Reviewed: Allergy & Precautions, H&P , NPO status , Patient's Chart, lab work & pertinent test results, reviewed documented beta blocker date and time   Airway Mallampati: II  TM Distance: >3 FB Neck ROM: Full    Dental no notable dental hx. (+) Teeth Intact, Dental Advisory Given   Pulmonary shortness of breath,    Pulmonary exam normal breath sounds clear to auscultation       Cardiovascular hypertension, Pt. on medications and Pt. on home beta blockers + Peripheral Vascular Disease   Rhythm:Regular Rate:Normal     Neuro/Psych negative neurological ROS  negative psych ROS   GI/Hepatic Neg liver ROS, GERD  ,  Endo/Other  diabetes, Poorly Controlled, Type 1, Insulin Dependent  Renal/GU CRFRenal disease  negative genitourinary   Musculoskeletal  (+) Arthritis , Osteoarthritis,    Abdominal   Peds  Hematology negative hematology ROS (+)   Anesthesia Other Findings   Reproductive/Obstetrics negative OB ROS                             Anesthesia Physical  Anesthesia Plan  ASA: IV  Anesthesia Plan: Regional and General   Post-op Pain Management:  Regional for Post-op pain   Induction: Intravenous  PONV Risk Score and Plan: 1 and Ondansetron  Airway Management Planned: LMA  Additional Equipment:   Intra-op Plan:   Post-operative Plan:   Informed Consent: I have reviewed the patients History and Physical, chart, labs and discussed the procedure including the risks, benefits and alternatives for the proposed anesthesia with the patient or authorized representative who has indicated his/her understanding and acceptance.   Dental advisory given  Plan Discussed with: CRNA  Anesthesia Plan Comments:         Anesthesia Quick Evaluation

## 2017-07-25 NOTE — Transfer of Care (Signed)
Immediate Anesthesia Transfer of Care Note  Patient: Lance [name]  Procedure(s) Performed: REVISION RIGHT BELOW KNEE AMPUTATION; (Right ) APPLICATION OF WOUND VAC (Right Leg Lower)  Patient Location: PACU  Anesthesia Type:General  Level of Consciousness: sedated and drowsy  Airway & Oxygen Therapy: Patient Spontanous Breathing and Patient connected to face mask oxygen  Post-op Assessment: Report given to RN and Post -op Vital signs reviewed and stable  Post vital signs: Reviewed and stable  Last Vitals:  Vitals Value Taken Time  BP 91/63 07/25/2017  9:11 AM  Temp    Pulse 95 07/25/2017  9:11 AM  Resp 18 07/25/2017  9:11 AM  SpO2 100 % 07/25/2017  9:11 AM  Vitals shown include unvalidated device data.  Last Pain:  Vitals:   07/25/17 0730  TempSrc:   PainSc: 0-No pain      Patients Stated Pain Goal: 6 (09/47/09 6283)  Complications: No apparent anesthesia complications

## 2017-07-25 NOTE — Anesthesia Postprocedure Evaluation (Signed)
Anesthesia Post Note  Patient: Lance [name]  Procedure(s) Performed: REVISION RIGHT BELOW KNEE AMPUTATION; (Right ) APPLICATION OF WOUND VAC (Right Leg Lower)     Patient location during evaluation: PACU Anesthesia Type: General Level of consciousness: sedated Pain management: pain level controlled Vital Signs Assessment: post-procedure vital signs reviewed and stable Respiratory status: spontaneous breathing and respiratory function stable Cardiovascular status: stable Postop Assessment: no apparent nausea or vomiting Anesthetic complications: no    Last Vitals:  Vitals:   07/25/17 0956 07/25/17 1018  BP: 95/63 128/81  Pulse: 87 92  Resp: 10 16  Temp:  (!) 36.2 C  SpO2: 97% 97%    Last Pain:  Vitals:   07/25/17 1018  TempSrc:   PainSc: 2                  Saryah Loper DANIEL

## 2017-07-25 NOTE — H&P (Signed)
Lance [name] is an 47 y.o. male.   Chief Complaint: Dehiscence right transtibial amputation. 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  Past Medical History:  Diagnosis Date  . Aortic stenosis    Very mild in 05/2007  . Arthritis    "right shoulder; right knee; feel like I've got it all over" (11/28/2015)  . CHF (congestive heart failure) ([place])   . Degenerative joint disease    Left TKA  . Diastolic heart failure (HCC)    LVEF 65-70% with grade 2 diastolic dysfunction  . ESRD (end stage renal disease) on dialysis ([place])    "MWF; [facility] [address]" (11/28/2015)  . Essential hypertension, benign 2000   LVH  . GERD (gastroesophageal reflux disease)   . HCAP (healthcare-associated pneumonia) 11/28/2015  . Hyperlipidemia 2000  . Loculated pleural effusion 11/28/2015   [name] 11/28/2015  . Pneumonia 12/2013; 05/2014  . PSVT (paroxysmal supraventricular tachycardia) (HCC)    Nonsustained; asymptomatic; diagnosed by event recorder in 2006  . Tobacco abuse, in remission    20 pack years; discontinued in 1985; bronchitic changes on chest x-ray and 2009  . Type 1 diabetes mellitus ([place]) 1990    Past Surgical History:  Procedure Laterality Date  . AMPUTATION Right 06/06/2017   Procedure: RIGHT BELOW KNEE AMPUTATION;  Surgeon: [name], MD;  Location: [place];  Service: Orthopedics;  Laterality: Right;  . AV FISTULA PLACEMENT Right 06/23/2014   Procedure: ARTERIOVENOUS (AV) FISTULA CREATION;  Surgeon: Angelia Mould, MD;  Location: [place];  Service: Vascular;  Laterality: Right;  . CARDIAC CATHETERIZATION     [facility]- evaluation for Kidney/ pancreas transplant  . EYE SURGERY Bilateral    "for glaucoma"  . INGUINAL HERNIA REPAIR Right    As a child  . LOWER EXTREMITY ANGIOGRAPHY N/A 05/27/2017   Procedure: LOWER EXTREMITY  ANGIOGRAPHY;  Surgeon: [name], MD;  Location: [place] [facility];  Service: Cardiovascular;  Laterality: N/A;  . WISDOM TOOTH EXTRACTION  1987    Family History  Problem Relation Age of Onset  . Diabetes Father   . Hypertension Father   . Diabetes Mother   . Hypertension Mother    Social History:  reports that he has never smoked. He has never used smokeless tobacco. He reports that he drinks alcohol. He reports that he does not use drugs.  Allergies:  Allergies  Allergen Reactions  . Atorvastatin Other (See Comments)    Myalgias   . Minoxidil Other (See Comments)    Fluid retention     Medications Prior to Admission  Medication Sig Dispense Refill  . aspirin EC 325 MG EC tablet Take 1 tablet (325 mg total) by mouth daily. 30 tablet 0  . calcitRIOL (ROCALTROL) 0.25 MCG capsule Take 1 capsule (0.25 mcg total) by mouth every Monday, Wednesday, and Friday with hemodialysis. 60 capsule 0  . calcium acetate (PHOSLO) 667 MG capsule Take 1 capsule (667 mg total) by mouth 3 (three) times daily. 90 capsule 10  . carvedilol (COREG) 3.125 MG tablet Take 1 tablet (3.125 mg total) by mouth 2 (two) times daily with a meal. 60 tablet 1  . docusate sodium (COLACE) 100 MG capsule Take 1 capsule (100 mg total) by mouth 2 (two) times daily. (Patient taking differently: Take 200 mg by mouth daily. ) 10 capsule 0  . gabapentin (NEURONTIN) 100 MG  capsule Take 1 capsule (100 mg total) by mouth daily. 30 capsule 1  . gabapentin (NEURONTIN) 300 MG capsule Take 1 capsule (300 mg total) by mouth every Monday, Wednesday, and Friday with hemodialysis. 90 capsule 0  . insulin glargine (LANTUS) 100 unit/mL SOPN Inject 0.25 mLs (25 Units total) into the skin at bedtime. 15 mL 11  . Insulin Lispro (HUMALOG KWIKPEN [place]) Inject 15-20 Units into the skin 3 (three) times daily before meals.     . ondansetron (ZOFRAN ODT) 4 MG disintegrating tablet Take 1 tablet (4 mg total) by mouth every 8 (eight) hours as  needed for nausea or vomiting. 30 tablet 5  . oxyCODONE-acetaminophen (PERCOCET/ROXICET) 5-325 MG tablet Take 1 tablet by mouth daily as needed for severe pain.    . pantoprazole (PROTONIX) 40 MG tablet Take 1 tablet (40 mg total) by mouth daily. 30 tablet 0  . pentoxifylline (TRENTAL) 400 MG CR tablet Take 1 tablet (400 mg total) by mouth 3 (three) times daily with meals. 90 tablet 3  . polyethylene glycol (MIRALAX) packet Take 17 g by mouth daily. 30 each 1  . silver sulfADIAZINE (SILVADENE) 1 % cream Apply 1 application topically daily. (Patient taking differently: Apply 1 application topically every other day. ) 50 g 0  . sucroferric oxyhydroxide (VELPHORO) 500 MG chewable tablet Chew 3 tablets (1,500 mg total) by mouth 3 (three) times daily. 120 tablet 0  . Continuous Blood Gluc Sensor (FREESTYLE LIBRE 14 DAY SENSOR) MISC 1 each by Does not apply route every 14 (fourteen) days. 2 each 5  . Darbepoetin Alfa (ARANESP) 60 MCG/0.3ML SOSY injection Inject 0.3 mLs (60 mcg total) into the vein every Monday with hemodialysis. (Patient not taking: Reported on 07/22/2017) 4.2 mL   . methocarbamol (ROBAXIN) 500 MG tablet Take 1 tablet (500 mg total) by mouth every 6 (six) hours as needed for muscle spasms. (Patient not taking: Reported on 07/22/2017) 60 tablet 0    Results for orders placed or performed during the hospital encounter of 07/25/17 (from the past 48 hour(s))  Glucose, capillary     Status: None   Collection Time: 07/25/17  6:24 AM  Result Value Ref Range   Glucose-Capillary 75 65 - 99 mg/dL   No results found.  Review of Systems  All other systems reviewed and are negative.   Blood pressure (!) 149/82, pulse (!) 102, temperature 98.3 F (36.8 C), temperature source Oral, resp. rate 20, height 6\' 3"  (1.905 m), weight 227 lb 1.2 oz (103 kg), SpO2 99 %. Physical Exam   Assessment/Plan Assessment: Dehiscence right transtibial amputation.  Plan: We will plan for revision of the right  transtibial amputation.  Risks and benefits were discussed including risk of the wound not healing.  Patient wishes to be discharged to home postoperatively will discharge with a Praveena portable wound VAC.  [name], MD 07/25/2017, 6:31 AM

## 2017-07-25 NOTE — Progress Notes (Signed)
Hypoglycemic Event  CBG: I-stat result glucose 53  Treatment: D50 IV 25 mL  Symptoms: Hungry  Follow-up CBG: Time: 0539 CBG Result: 86  Possible Reasons for Event: Inadequate meal intake  Comments/MD notified: Called Dr. Tobias Alexander to make aware, orders for 1/2 amp given. Pt states he "feels better" and also has "frequent hypo events." Pt reports he is working with endocrinologist to adjust insulin regimen.     Jakelin Taussig, Jaynie Bream

## 2017-07-25 NOTE — Anesthesia Procedure Notes (Signed)
Procedure Name: LMA Insertion Date/Time: 07/25/2017 8:34 AM Performed by: Orlie Dakin, CRNA Pre-anesthesia Checklist: Emergency Drugs available, Suction available, Patient identified, Patient being monitored and Timeout performed Patient Re-evaluated:Patient Re-evaluated prior to induction Oxygen Delivery Method: Circle system utilized Preoxygenation: Pre-oxygenation with 100% oxygen Induction Type: IV induction Ventilation: Mask ventilation without difficulty LMA: LMA inserted LMA Size: 5.0 Tube type: Oral Number of attempts: 1 Placement Confirmation: positive ETCO2 Tube secured with: Tape Dental Injury: Teeth and Oropharynx as per pre-operative assessment  Comments: LMA placed by [facility] CRNA

## 2017-07-25 NOTE — Op Note (Signed)
07/25/2017  9:10 AM  PATIENT:  Lance [name]    PRE-OPERATIVE DIAGNOSIS:  Dehiscence Right Below Knee Amputation  POST-OPERATIVE DIAGNOSIS:  Same  PROCEDURE:  REVISION RIGHT BELOW KNEE AMPUTATION;, APPLICATION OF WOUND VAC  SURGEON:  [name], MD  PHYSICIAN ASSISTANT:None ANESTHESIA:   General  PREOPERATIVE INDICATIONS:  [name] is a  47 y.o. male with a diagnosis of Dehiscence Right Below Knee Amputation who failed conservative measures and elected for surgical management.    The risks benefits and alternatives were discussed with the patient preoperatively including but not limited to the risks of infection, bleeding, nerve injury, cardiopulmonary complications, the need for revision surgery, among others, and the patient was willing to proceed.  OPERATIVE IMPLANTS: Praveena wound VAC  @ENCIMAGES @  OPERATIVE FINDINGS: Necrotic muscle  OPERATIVE PROCEDURE: Patient was brought the operating room and underwent a general anesthetic.  After adequate levels of anesthesia were obtained patient's right lower extremity was prepped using DuraPrep draped into a sterile field a timeout was called patient received preoperative antibiotics.  A fishmouth incision was made around the necrotic tissue this was carried sharply down to bone.  The distal 2 cm of the tibia and fibula were resected.  2-0 silk was used to suture the vessels.  Electrocautery was used for further hemostasis.  The wound was irrigated with normal saline.  The deep and superficial fascial layers and skin was closed using 2-0 nylon.  Staples were also used.  The Praveena wound VAC was applied this had a good suction fit.  Patient was extubated taken to the PACU in stable condition.   DISCHARGE PLANNING:  Antibiotic duration: Preoperative  Weightbearing: Nonweightbearing on the right  Pain medication: Prescription for Percocet  Dressing care/ Wound VAC: Continue wound VAC for 1 week  Ambulatory  devices: Walker  Discharge to: Home  Follow-up: In the office 1 week post operative.

## 2017-07-26 ENCOUNTER — Encounter (HOSPITAL_COMMUNITY): Payer: Self-pay | Admitting: Orthopedic Surgery

## 2017-07-26 DIAGNOSIS — N186 End stage renal disease: Secondary | ICD-10-CM | POA: Diagnosis not present

## 2017-07-26 DIAGNOSIS — N2581 Secondary hyperparathyroidism of renal origin: Secondary | ICD-10-CM | POA: Diagnosis not present

## 2017-07-28 ENCOUNTER — Telehealth (INDEPENDENT_AMBULATORY_CARE_PROVIDER_SITE_OTHER): Payer: Self-pay | Admitting: Orthopedic Surgery

## 2017-07-28 DIAGNOSIS — N2581 Secondary hyperparathyroidism of renal origin: Secondary | ICD-10-CM | POA: Diagnosis not present

## 2017-07-28 DIAGNOSIS — N186 End stage renal disease: Secondary | ICD-10-CM | POA: Diagnosis not present

## 2017-07-28 NOTE — Telephone Encounter (Signed)
Patient called stating that he had surgery with Dr. Sharol Given on Friday, June 14th and he is suppose to have home health nurses come out but they have not received any orders for wound care, so they have not seen him as of today.  CB#[phone].  Thank you.

## 2017-07-28 NOTE — Telephone Encounter (Signed)
I called pt and questioned if he was still wearing his wound vac and he is. Advised that this will stay on until hispost op appt on Wednesday. HHN not needed at this point because they will not be able to remove the vac. Order can be given if after his appt Lance [name] finds that he needs skilled dressing changes. Pt voiced understanding and will call with questions.

## 2017-07-29 ENCOUNTER — Other Ambulatory Visit: Payer: Self-pay

## 2017-07-29 DIAGNOSIS — N186 End stage renal disease: Secondary | ICD-10-CM | POA: Diagnosis not present

## 2017-07-29 DIAGNOSIS — N2581 Secondary hyperparathyroidism of renal origin: Secondary | ICD-10-CM | POA: Diagnosis not present

## 2017-07-29 MED ORDER — FREESTYLE LIBRE 14 DAY READER DEVI: 1.0000 | 0 refills | Status: DC

## 2017-07-30 ENCOUNTER — Ambulatory Visit (INDEPENDENT_AMBULATORY_CARE_PROVIDER_SITE_OTHER): Payer: BLUE CROSS/BLUE SHIELD | Admitting: Family

## 2017-07-30 ENCOUNTER — Ambulatory Visit (INDEPENDENT_AMBULATORY_CARE_PROVIDER_SITE_OTHER): Payer: BLUE CROSS/BLUE SHIELD | Admitting: Orthopedic Surgery

## 2017-07-30 ENCOUNTER — Encounter (INDEPENDENT_AMBULATORY_CARE_PROVIDER_SITE_OTHER): Payer: Self-pay | Admitting: Family

## 2017-07-30 VITALS — Ht 75.0 in | Wt 227.0 lb

## 2017-07-30 DIAGNOSIS — S88111A Complete traumatic amputation at level between knee and ankle, right lower leg, initial encounter: Secondary | ICD-10-CM

## 2017-07-30 DIAGNOSIS — Z89511 Acquired absence of right leg below knee: Secondary | ICD-10-CM

## 2017-07-31 ENCOUNTER — Encounter (INDEPENDENT_AMBULATORY_CARE_PROVIDER_SITE_OTHER): Payer: Self-pay | Admitting: Family

## 2017-07-31 ENCOUNTER — Telehealth (INDEPENDENT_AMBULATORY_CARE_PROVIDER_SITE_OTHER): Payer: Self-pay | Admitting: Orthopedic Surgery

## 2017-07-31 DIAGNOSIS — D631 Anemia in chronic kidney disease: Secondary | ICD-10-CM | POA: Diagnosis not present

## 2017-07-31 DIAGNOSIS — I132 Hypertensive heart and chronic kidney disease with heart failure and with stage 5 chronic kidney disease, or end stage renal disease: Secondary | ICD-10-CM | POA: Diagnosis not present

## 2017-07-31 DIAGNOSIS — Z992 Dependence on renal dialysis: Secondary | ICD-10-CM | POA: Diagnosis not present

## 2017-07-31 DIAGNOSIS — I5032 Chronic diastolic (congestive) heart failure: Secondary | ICD-10-CM | POA: Diagnosis not present

## 2017-07-31 DIAGNOSIS — F17200 Nicotine dependence, unspecified, uncomplicated: Secondary | ICD-10-CM | POA: Diagnosis not present

## 2017-07-31 DIAGNOSIS — E1051 Type 1 diabetes mellitus with diabetic peripheral angiopathy without gangrene: Secondary | ICD-10-CM | POA: Diagnosis not present

## 2017-07-31 DIAGNOSIS — N186 End stage renal disease: Secondary | ICD-10-CM | POA: Diagnosis not present

## 2017-07-31 DIAGNOSIS — I471 Supraventricular tachycardia: Secondary | ICD-10-CM | POA: Diagnosis not present

## 2017-07-31 DIAGNOSIS — N2581 Secondary hyperparathyroidism of renal origin: Secondary | ICD-10-CM | POA: Diagnosis not present

## 2017-07-31 DIAGNOSIS — E1042 Type 1 diabetes mellitus with diabetic polyneuropathy: Secondary | ICD-10-CM | POA: Diagnosis not present

## 2017-07-31 DIAGNOSIS — M19011 Primary osteoarthritis, right shoulder: Secondary | ICD-10-CM | POA: Diagnosis not present

## 2017-07-31 DIAGNOSIS — E1022 Type 1 diabetes mellitus with diabetic chronic kidney disease: Secondary | ICD-10-CM | POA: Diagnosis not present

## 2017-07-31 DIAGNOSIS — Z4781 Encounter for orthopedic aftercare following surgical amputation: Secondary | ICD-10-CM | POA: Diagnosis not present

## 2017-07-31 DIAGNOSIS — M1711 Unilateral primary osteoarthritis, right knee: Secondary | ICD-10-CM | POA: Diagnosis not present

## 2017-07-31 NOTE — Telephone Encounter (Signed)
Levada Dy from [facility] called wanting to know if any orders have changed for patients stump? CB # [phone]

## 2017-07-31 NOTE — Progress Notes (Signed)
Post-Op Visit Note   Patient: Lance [name]           Date of Birth: 1970/09/21           MRN: [id number] Visit Date: 07/30/2017 PCP: [name], MD  Chief Complaint:  Chief Complaint  Patient presents with  . Right Leg - Routine Post Op    07/25/17 revision right BKA     HPI:  HPI To the patient is a 47 year old gentleman seen today 1 week status post revision of right below the knee amputation.  He wound VAC that was removed today.  There continues to be some swelling.  Is wearing a shrinker over the VAC dressing. Ortho Exam Lance [name] is well approximated with sutures.  There is minimal serous drainage.  Minimal Swelling no erythema odor.  No sign of infection.  Visit Diagnoses:  1. Amputation of right lower extremity below knee (HCC)     Plan: Begin daily Dial soap cleansing.  Apply dry dressings.  Wear shrinker daily.  Follow-up in the office and to evaluate for suture removal  Follow-Up Instructions: Return in about 2 weeks (around 08/13/2017).   Imaging: No results found.  Orders:  No orders of the defined types were placed in this encounter.  No orders of the defined types were placed in this encounter.    PMFS History: Patient Active Problem List   Diagnosis Date Noted  . Dehiscence of amputation stump ([place]) 07/17/2017  . Benign essential HTN   . Chronic diastolic (congestive) heart failure ([place])   . Labile blood glucose   . Labile blood pressure   . Vomiting   . Phantom limb pain ([place])   . Poorly controlled type 2 diabetes mellitus with peripheral neuropathy ([place])   . Unilateral complete BKA, right, sequela ([place])   . Amputation of right lower extremity below knee ([place]) 06/10/2017  . ESRD on dialysis ([place])   . Chronic diastolic congestive heart failure ([place])   . History of PSVT (paroxysmal supraventricular tachycardia)   . Diabetes mellitus type 2 in nonobese (HCC)   . Post-operative pain   . Acute blood loss anemia   . Anemia of chronic  disease   . Leukocytosis   . Tachycardia   . History of right below knee amputation ([place]) 06/06/2017  . PVD (peripheral vascular disease) ([name]) 05/13/2017  . Bilateral pleural effusion 11/28/2015  . Loculated pleural effusion 11/28/2015  . HCAP (healthcare-associated pneumonia) 11/28/2015  . ESRD (end stage renal disease) on dialysis ([place]) 11/12/2015  . Hyperkalemia 11/12/2015  . Shoulder pain, acute 11/12/2015  . Nausea & vomiting 11/12/2015  . Diarrhea 11/12/2015  . Hypertensive urgency 05/05/2014  . Nephrotic syndrome 12/29/2013  . Hyponatremia 12/28/2013  . Acute diastolic heart failure ([place]) 12/28/2013  . Hypoglycemia 12/28/2013  . Dyspnea   . Acute renal failure syndrome ([place])   . Diabetic ketoacidosis without coma associated with type 1 diabetes mellitus ([place])   . Demand ischemia ([place]) 12/24/2013  . DKA (diabetic ketoacidoses) ([place]) 12/23/2013  . CAP (community acquired pneumonia) 12/23/2013  . Sepsis due to pneumonia ([place]) 12/23/2013  . Metabolic acidemia 26/20/3559  . Acute renal failure ([place]) 12/23/2013  . Diabetic ketoacidosis ([place]) 12/23/2013  . Chronic diastolic heart failure ([place]) 04/14/2013  . PSVT (paroxysmal supraventricular tachycardia) ([place]) 04/14/2013  . Bilateral leg edema 03/26/2013  . Chronic kidney disease, stage III (moderate) ([place]) 09/29/2011  . Type 1 diabetes mellitus with end-stage renal disease ([place]) 09/26/2010  . Mixed hyperlipidemia 09/26/2010  Past Medical History:  Diagnosis Date  . Aortic stenosis    Very mild in 05/2007  . Arthritis    "right shoulder; right knee; feel like I've got it all over" (11/28/2015)  . CHF (congestive heart failure) ([place])   . Degenerative joint disease    Left TKA  . Diastolic heart failure (HCC)    LVEF 65-70% with grade 2 diastolic dysfunction  . ESRD (end stage renal disease) on dialysis ([place])    "MWF; [facility] [address]" (11/28/2015)  . Essential hypertension, benign 2000   LVH  . GERD  (gastroesophageal reflux disease)   . HCAP (healthcare-associated pneumonia) 11/28/2015  . Hyperlipidemia 2000  . Loculated pleural effusion 11/28/2015   [name] 11/28/2015  . Pneumonia 12/2013; 05/2014  . PSVT (paroxysmal supraventricular tachycardia) (HCC)    Nonsustained; asymptomatic; diagnosed by event recorder in 2006  . Tobacco abuse, in remission    20 pack years; discontinued in 1985; bronchitic changes on chest x-ray and 2009  . Type 1 diabetes mellitus ([place]) 1990    Family History  Problem Relation Age of Onset  . Diabetes Father   . Hypertension Father   . Diabetes Mother   . Hypertension Mother     Past Surgical History:  Procedure Laterality Date  . AMPUTATION Right 06/06/2017   Procedure: RIGHT BELOW KNEE AMPUTATION;  Surgeon: [name], MD;  Location: [facility];  Service: Orthopedics;  Laterality: Right;  . APPLICATION OF WOUND VAC Right 07/25/2017   Procedure: APPLICATION OF WOUND VAC;  Surgeon: [name], MD;  Location: [facility];  Service: Orthopedics;  Laterality: Right;  . AV FISTULA PLACEMENT Right 06/23/2014   Procedure: ARTERIOVENOUS (AV) FISTULA CREATION;  Surgeon: Angelia Mould, MD;  Location: [place];  Service: Vascular;  Laterality: Right;  . CARDIAC CATHETERIZATION     [facility]- evaluation for Kidney/ pancreas transplant  . EYE SURGERY Bilateral    "for glaucoma"  . INGUINAL HERNIA REPAIR Right    As a child  . LOWER EXTREMITY ANGIOGRAPHY N/A 05/27/2017   Procedure: LOWER EXTREMITY ANGIOGRAPHY;  Surgeon: [name], MD;  Location: [place] [facility];  Service: Cardiovascular;  Laterality: N/A;  . STUMP REVISION Right 07/25/2017   Procedure: REVISION RIGHT BELOW KNEE AMPUTATION;;  Surgeon: [name], MD;  Location: [facility];  Service: Orthopedics;  Laterality: Right;  . [place] EXTRACTION  1987   Social History   Occupational History  . Not on file  Tobacco Use  . Smoking status: Never Smoker  . Smokeless tobacco: Never Used    Substance and Sexual Activity  . Alcohol use: Yes    Comment: 11/28/2015 "used to drink; stopped ~ 2 yr ago"  . Drug use: No  . Sexual activity: Yes

## 2017-07-31 NOTE — Telephone Encounter (Signed)
Called to advise that the pt should wash the limb with dial soap and water, apply a dry dressing and a shrinker. Three times a week.

## 2017-08-01 DIAGNOSIS — N2581 Secondary hyperparathyroidism of renal origin: Secondary | ICD-10-CM | POA: Diagnosis not present

## 2017-08-01 DIAGNOSIS — N186 End stage renal disease: Secondary | ICD-10-CM | POA: Diagnosis not present

## 2017-08-02 DIAGNOSIS — E1051 Type 1 diabetes mellitus with diabetic peripheral angiopathy without gangrene: Secondary | ICD-10-CM | POA: Diagnosis not present

## 2017-08-02 DIAGNOSIS — D631 Anemia in chronic kidney disease: Secondary | ICD-10-CM | POA: Diagnosis not present

## 2017-08-02 DIAGNOSIS — I5032 Chronic diastolic (congestive) heart failure: Secondary | ICD-10-CM | POA: Diagnosis not present

## 2017-08-02 DIAGNOSIS — I132 Hypertensive heart and chronic kidney disease with heart failure and with stage 5 chronic kidney disease, or end stage renal disease: Secondary | ICD-10-CM | POA: Diagnosis not present

## 2017-08-02 DIAGNOSIS — M19011 Primary osteoarthritis, right shoulder: Secondary | ICD-10-CM | POA: Diagnosis not present

## 2017-08-02 DIAGNOSIS — Z4781 Encounter for orthopedic aftercare following surgical amputation: Secondary | ICD-10-CM | POA: Diagnosis not present

## 2017-08-02 DIAGNOSIS — N186 End stage renal disease: Secondary | ICD-10-CM | POA: Diagnosis not present

## 2017-08-02 DIAGNOSIS — E1042 Type 1 diabetes mellitus with diabetic polyneuropathy: Secondary | ICD-10-CM | POA: Diagnosis not present

## 2017-08-02 DIAGNOSIS — E1022 Type 1 diabetes mellitus with diabetic chronic kidney disease: Secondary | ICD-10-CM | POA: Diagnosis not present

## 2017-08-02 DIAGNOSIS — Z992 Dependence on renal dialysis: Secondary | ICD-10-CM | POA: Diagnosis not present

## 2017-08-02 DIAGNOSIS — F17200 Nicotine dependence, unspecified, uncomplicated: Secondary | ICD-10-CM | POA: Diagnosis not present

## 2017-08-02 DIAGNOSIS — I471 Supraventricular tachycardia: Secondary | ICD-10-CM | POA: Diagnosis not present

## 2017-08-02 DIAGNOSIS — M1711 Unilateral primary osteoarthritis, right knee: Secondary | ICD-10-CM | POA: Diagnosis not present

## 2017-08-04 DIAGNOSIS — N2581 Secondary hyperparathyroidism of renal origin: Secondary | ICD-10-CM | POA: Diagnosis not present

## 2017-08-04 DIAGNOSIS — I471 Supraventricular tachycardia: Secondary | ICD-10-CM | POA: Diagnosis not present

## 2017-08-04 DIAGNOSIS — F17200 Nicotine dependence, unspecified, uncomplicated: Secondary | ICD-10-CM | POA: Diagnosis not present

## 2017-08-04 DIAGNOSIS — N186 End stage renal disease: Secondary | ICD-10-CM | POA: Diagnosis not present

## 2017-08-04 DIAGNOSIS — E1022 Type 1 diabetes mellitus with diabetic chronic kidney disease: Secondary | ICD-10-CM | POA: Diagnosis not present

## 2017-08-04 DIAGNOSIS — D631 Anemia in chronic kidney disease: Secondary | ICD-10-CM | POA: Diagnosis not present

## 2017-08-04 DIAGNOSIS — E1042 Type 1 diabetes mellitus with diabetic polyneuropathy: Secondary | ICD-10-CM | POA: Diagnosis not present

## 2017-08-04 DIAGNOSIS — I5032 Chronic diastolic (congestive) heart failure: Secondary | ICD-10-CM | POA: Diagnosis not present

## 2017-08-04 DIAGNOSIS — I132 Hypertensive heart and chronic kidney disease with heart failure and with stage 5 chronic kidney disease, or end stage renal disease: Secondary | ICD-10-CM | POA: Diagnosis not present

## 2017-08-04 DIAGNOSIS — M19011 Primary osteoarthritis, right shoulder: Secondary | ICD-10-CM | POA: Diagnosis not present

## 2017-08-04 DIAGNOSIS — E1051 Type 1 diabetes mellitus with diabetic peripheral angiopathy without gangrene: Secondary | ICD-10-CM | POA: Diagnosis not present

## 2017-08-04 DIAGNOSIS — M1711 Unilateral primary osteoarthritis, right knee: Secondary | ICD-10-CM | POA: Diagnosis not present

## 2017-08-04 DIAGNOSIS — Z992 Dependence on renal dialysis: Secondary | ICD-10-CM | POA: Diagnosis not present

## 2017-08-04 DIAGNOSIS — Z4781 Encounter for orthopedic aftercare following surgical amputation: Secondary | ICD-10-CM | POA: Diagnosis not present

## 2017-08-05 ENCOUNTER — Other Ambulatory Visit: Payer: Self-pay | Admitting: Family Medicine

## 2017-08-05 DIAGNOSIS — N186 End stage renal disease: Secondary | ICD-10-CM | POA: Diagnosis not present

## 2017-08-05 DIAGNOSIS — N2581 Secondary hyperparathyroidism of renal origin: Secondary | ICD-10-CM | POA: Diagnosis not present

## 2017-08-06 DIAGNOSIS — M19011 Primary osteoarthritis, right shoulder: Secondary | ICD-10-CM | POA: Diagnosis not present

## 2017-08-06 DIAGNOSIS — E1022 Type 1 diabetes mellitus with diabetic chronic kidney disease: Secondary | ICD-10-CM | POA: Diagnosis not present

## 2017-08-06 DIAGNOSIS — R0602 Shortness of breath: Secondary | ICD-10-CM | POA: Diagnosis not present

## 2017-08-06 DIAGNOSIS — I471 Supraventricular tachycardia: Secondary | ICD-10-CM | POA: Diagnosis not present

## 2017-08-06 DIAGNOSIS — Z992 Dependence on renal dialysis: Secondary | ICD-10-CM | POA: Diagnosis not present

## 2017-08-06 DIAGNOSIS — M1711 Unilateral primary osteoarthritis, right knee: Secondary | ICD-10-CM | POA: Diagnosis not present

## 2017-08-06 DIAGNOSIS — R062 Wheezing: Secondary | ICD-10-CM | POA: Diagnosis not present

## 2017-08-06 DIAGNOSIS — I5032 Chronic diastolic (congestive) heart failure: Secondary | ICD-10-CM | POA: Diagnosis not present

## 2017-08-06 DIAGNOSIS — D631 Anemia in chronic kidney disease: Secondary | ICD-10-CM | POA: Diagnosis not present

## 2017-08-06 DIAGNOSIS — N186 End stage renal disease: Secondary | ICD-10-CM | POA: Diagnosis not present

## 2017-08-06 DIAGNOSIS — E1051 Type 1 diabetes mellitus with diabetic peripheral angiopathy without gangrene: Secondary | ICD-10-CM | POA: Diagnosis not present

## 2017-08-06 DIAGNOSIS — Z4781 Encounter for orthopedic aftercare following surgical amputation: Secondary | ICD-10-CM | POA: Diagnosis not present

## 2017-08-06 DIAGNOSIS — F17200 Nicotine dependence, unspecified, uncomplicated: Secondary | ICD-10-CM | POA: Diagnosis not present

## 2017-08-06 DIAGNOSIS — E1042 Type 1 diabetes mellitus with diabetic polyneuropathy: Secondary | ICD-10-CM | POA: Diagnosis not present

## 2017-08-06 DIAGNOSIS — I132 Hypertensive heart and chronic kidney disease with heart failure and with stage 5 chronic kidney disease, or end stage renal disease: Secondary | ICD-10-CM | POA: Diagnosis not present

## 2017-08-07 ENCOUNTER — Encounter (HOSPITAL_COMMUNITY): Payer: Self-pay

## 2017-08-07 ENCOUNTER — Emergency Department (HOSPITAL_COMMUNITY): Payer: BLUE CROSS/BLUE SHIELD

## 2017-08-07 ENCOUNTER — Other Ambulatory Visit: Payer: Self-pay

## 2017-08-07 ENCOUNTER — Observation Stay (HOSPITAL_COMMUNITY)
Admission: EM | Admit: 2017-08-07 | Discharge: 2017-08-08 | Disposition: A | Discharge status: Home / Self Care | Payer: BLUE CROSS/BLUE SHIELD | Attending: Internal Medicine | Admitting: Internal Medicine

## 2017-08-07 ENCOUNTER — Inpatient Hospital Stay (HOSPITAL_COMMUNITY): Payer: BLUE CROSS/BLUE SHIELD

## 2017-08-07 DIAGNOSIS — E1142 Type 2 diabetes mellitus with diabetic polyneuropathy: Secondary | ICD-10-CM | POA: Diagnosis present

## 2017-08-07 DIAGNOSIS — N186 End stage renal disease: Secondary | ICD-10-CM

## 2017-08-07 DIAGNOSIS — J9601 Acute respiratory failure with hypoxia: Secondary | ICD-10-CM | POA: Diagnosis not present

## 2017-08-07 DIAGNOSIS — Z7982 Long term (current) use of aspirin: Secondary | ICD-10-CM | POA: Insufficient documentation

## 2017-08-07 DIAGNOSIS — Z79899 Other long term (current) drug therapy: Secondary | ICD-10-CM | POA: Diagnosis not present

## 2017-08-07 DIAGNOSIS — I5032 Chronic diastolic (congestive) heart failure: Secondary | ICD-10-CM | POA: Diagnosis not present

## 2017-08-07 DIAGNOSIS — R05 Cough: Secondary | ICD-10-CM | POA: Diagnosis not present

## 2017-08-07 DIAGNOSIS — E1022 Type 1 diabetes mellitus with diabetic chronic kidney disease: Secondary | ICD-10-CM | POA: Diagnosis not present

## 2017-08-07 DIAGNOSIS — R112 Nausea with vomiting, unspecified: Secondary | ICD-10-CM | POA: Diagnosis not present

## 2017-08-07 DIAGNOSIS — N2581 Secondary hyperparathyroidism of renal origin: Secondary | ICD-10-CM | POA: Diagnosis not present

## 2017-08-07 DIAGNOSIS — I517 Cardiomegaly: Secondary | ICD-10-CM | POA: Insufficient documentation

## 2017-08-07 DIAGNOSIS — Z888 Allergy status to other drugs, medicaments and biological substances status: Secondary | ICD-10-CM | POA: Diagnosis not present

## 2017-08-07 DIAGNOSIS — Z89511 Acquired absence of right leg below knee: Secondary | ICD-10-CM | POA: Diagnosis not present

## 2017-08-07 DIAGNOSIS — J811 Chronic pulmonary edema: Secondary | ICD-10-CM | POA: Insufficient documentation

## 2017-08-07 DIAGNOSIS — R0603 Acute respiratory distress: Secondary | ICD-10-CM | POA: Diagnosis present

## 2017-08-07 DIAGNOSIS — D638 Anemia in other chronic diseases classified elsewhere: Secondary | ICD-10-CM | POA: Diagnosis present

## 2017-08-07 DIAGNOSIS — I10 Essential (primary) hypertension: Secondary | ICD-10-CM | POA: Diagnosis present

## 2017-08-07 DIAGNOSIS — E1165 Type 2 diabetes mellitus with hyperglycemia: Secondary | ICD-10-CM

## 2017-08-07 DIAGNOSIS — S88111A Complete traumatic amputation at level between knee and ankle, right lower leg, initial encounter: Secondary | ICD-10-CM

## 2017-08-07 DIAGNOSIS — Z992 Dependence on renal dialysis: Secondary | ICD-10-CM | POA: Diagnosis not present

## 2017-08-07 DIAGNOSIS — R000 Tachycardia, unspecified: Secondary | ICD-10-CM | POA: Diagnosis present

## 2017-08-07 LAB — CBC WITH DIFFERENTIAL/PLATELET
Abs Immature Granulocytes: 0.1 10*3/uL (ref 0.0–0.1)
Basophils Absolute: 0.1 10*3/uL (ref 0.0–0.1)
Basophils Relative: 1 %
EOS ABS: 0.3 10*3/uL (ref 0.0–0.7)
Eosinophils Relative: 3 %
HEMATOCRIT: 35.6 % — AB (ref 39.0–52.0)
HEMOGLOBIN: 11 g/dL — AB (ref 13.0–17.0)
Immature Granulocytes: 1 %
LYMPHS ABS: 1.4 10*3/uL (ref 0.7–4.0)
LYMPHS PCT: 15 %
MCH: 30 pg (ref 26.0–34.0)
MCHC: 30.9 g/dL (ref 30.0–36.0)
MCV: 97 fL (ref 78.0–100.0)
MONO ABS: 0.8 10*3/uL (ref 0.1–1.0)
MONOS PCT: 9 %
Neutro Abs: 6.8 10*3/uL (ref 1.7–7.7)
Neutrophils Relative %: 73 %
Platelets: 252 10*3/uL (ref 150–400)
RBC: 3.67 MIL/uL — ABNORMAL LOW (ref 4.22–5.81)
RDW: 17.2 % — AB (ref 11.5–15.5)
WBC: 9.3 10*3/uL (ref 4.0–10.5)

## 2017-08-07 LAB — I-STAT CG4 LACTIC ACID, ED: LACTIC ACID, VENOUS: 1.74 mmol/L (ref 0.5–1.9)

## 2017-08-07 LAB — COMPREHENSIVE METABOLIC PANEL
ALK PHOS: 100 U/L (ref 38–126)
ALT: 8 U/L (ref 0–44)
ANION GAP: 15 (ref 5–15)
AST: 13 U/L — ABNORMAL LOW (ref 15–41)
Albumin: 3.1 g/dL — ABNORMAL LOW (ref 3.5–5.0)
BILIRUBIN TOTAL: 0.9 mg/dL (ref 0.3–1.2)
BUN: 51 mg/dL — AB (ref 6–20)
CO2: 27 mmol/L (ref 22–32)
Calcium: 9 mg/dL (ref 8.9–10.3)
Chloride: 92 mmol/L — ABNORMAL LOW (ref 98–111)
Creatinine, Ser: 9.97 mg/dL — ABNORMAL HIGH (ref 0.61–1.24)
GFR calc Af Amer: 6 mL/min — ABNORMAL LOW (ref >60)
GFR, EST NON AFRICAN AMERICAN: 5 mL/min — AB (ref >60)
GLUCOSE: 318 mg/dL — AB (ref 70–99)
POTASSIUM: 4.8 mmol/L (ref 3.5–5.1)
Sodium: 134 mmol/L — ABNORMAL LOW (ref 135–145)
TOTAL PROTEIN: 7.4 g/dL (ref 6.5–8.1)

## 2017-08-07 LAB — GLUCOSE, CAPILLARY
Glucose-Capillary: 442 mg/dL — ABNORMAL HIGH (ref 70–99)
Glucose-Capillary: 471 mg/dL — ABNORMAL HIGH (ref 70–99)
Glucose-Capillary: 170 mg/dL — ABNORMAL HIGH (ref 70–99)

## 2017-08-07 LAB — D-DIMER, QUANTITATIVE: D-Dimer, Quant: 1.73 ug/mL-FEU — ABNORMAL HIGH (ref 0.00–0.50)

## 2017-08-07 LAB — I-STAT TROPONIN, ED: Troponin i, poc: 0.06 ng/mL (ref 0.00–0.08)

## 2017-08-07 LAB — HEPATITIS B SURFACE ANTIGEN: HEP B S AG: NEGATIVE

## 2017-08-07 MED ORDER — TECHNETIUM TC 99M DIETHYLENETRIAME-PENTAACETIC ACID
32.4000 | Freq: Once | INTRAVENOUS | Status: AC | PRN
  Administered 2017-08-07: 32.4 via RESPIRATORY_TRACT

## 2017-08-07 MED ORDER — ALTEPLASE 2 MG IJ SOLR: 2.0000 mg | Freq: Once | INTRAMUSCULAR | Status: DC | PRN

## 2017-08-07 MED ORDER — HEPARIN SODIUM (PORCINE) 1000 UNIT/ML DIALYSIS: 20.0000 [IU]/kg | INTRAMUSCULAR | Status: DC | PRN

## 2017-08-07 MED ORDER — LIDOCAINE-PRILOCAINE 2.5-2.5 % EX CREA: 1.0000 "application " | TOPICAL_CREAM | CUTANEOUS | Status: DC | PRN

## 2017-08-07 MED ORDER — POLYETHYLENE GLYCOL 3350 17 G PO PACK: 17.0000 g | PACK | Freq: Every day | ORAL | Status: DC | PRN

## 2017-08-07 MED ORDER — IOPAMIDOL (ISOVUE-370) INJECTION 76%
100.0000 mL | Freq: Once | INTRAVENOUS | Status: AC | PRN
  Administered 2017-08-07: 100 mL via INTRAVENOUS

## 2017-08-07 MED ORDER — ONDANSETRON HCL 4 MG PO TABS
4.0000 mg | ORAL_TABLET | Freq: Four times a day (QID) | ORAL | Status: DC | PRN
  Filled 2017-08-07: qty 1

## 2017-08-07 MED ORDER — METHOCARBAMOL 500 MG PO TABS: 500.0000 mg | ORAL_TABLET | Freq: Four times a day (QID) | ORAL | Status: DC | PRN

## 2017-08-07 MED ORDER — CHLORHEXIDINE GLUCONATE CLOTH 2 % EX PADS
6.0000 | MEDICATED_PAD | Freq: Every day | CUTANEOUS | Status: DC
  Administered 2017-08-07 – 2017-08-08 (×2): 6 via TOPICAL

## 2017-08-07 MED ORDER — LIDOCAINE HCL (PF) 1 % IJ SOLN: 5.0000 mL | INTRAMUSCULAR | Status: DC | PRN

## 2017-08-07 MED ORDER — ACETAMINOPHEN 500 MG PO TABS: 1000.0000 mg | ORAL_TABLET | Freq: Four times a day (QID) | ORAL | Status: DC | PRN

## 2017-08-07 MED ORDER — INSULIN ASPART 100 UNIT/ML ~~LOC~~ SOLN: 0.0000 [IU] | Freq: Every day | SUBCUTANEOUS | Status: DC

## 2017-08-07 MED ORDER — SODIUM CHLORIDE 0.9 % IV SOLN: 100.0000 mL | INTRAVENOUS | Status: DC | PRN

## 2017-08-07 MED ORDER — INSULIN ASPART 100 UNIT/ML ~~LOC~~ SOLN
0.0000 [IU] | Freq: Three times a day (TID) | SUBCUTANEOUS | Status: DC
  Administered 2017-08-07: 9 [IU] via SUBCUTANEOUS
  Administered 2017-08-08: 7 [IU] via SUBCUTANEOUS

## 2017-08-07 MED ORDER — INSULIN ASPART 100 UNIT/ML ~~LOC~~ SOLN
10.0000 [IU] | Freq: Once | SUBCUTANEOUS | Status: AC
  Administered 2017-08-07: 10 [IU] via SUBCUTANEOUS

## 2017-08-07 MED ORDER — ASPIRIN EC 325 MG PO TBEC
325.0000 mg | DELAYED_RELEASE_TABLET | Freq: Every day | ORAL | Status: DC
  Administered 2017-08-07 – 2017-08-08 (×2): 325 mg via ORAL
  Filled 2017-08-07 (×2): qty 1

## 2017-08-07 MED ORDER — TRAZODONE HCL 50 MG PO TABS
25.0000 mg | ORAL_TABLET | Freq: Every evening | ORAL | Status: DC | PRN
  Administered 2017-08-07: 25 mg via ORAL
  Filled 2017-08-07: qty 1

## 2017-08-07 MED ORDER — DOCUSATE SODIUM 100 MG PO CAPS: 200.0000 mg | ORAL_CAPSULE | Freq: Every day | ORAL | Status: DC | PRN

## 2017-08-07 MED ORDER — PANTOPRAZOLE SODIUM 40 MG PO TBEC
40.0000 mg | DELAYED_RELEASE_TABLET | Freq: Every day | ORAL | Status: DC
Start: 2017-08-07 — End: 2017-08-07

## 2017-08-07 MED ORDER — OXYCODONE HCL 5 MG PO TABS
5.0000 mg | ORAL_TABLET | ORAL | Status: DC | PRN
  Administered 2017-08-07 – 2017-08-08 (×3): 5 mg via ORAL
  Filled 2017-08-07 (×3): qty 1

## 2017-08-07 MED ORDER — CALCIUM ACETATE (PHOS BINDER) 667 MG PO CAPS
667.0000 mg | ORAL_CAPSULE | Freq: Three times a day (TID) | ORAL | Status: DC
  Administered 2017-08-07 – 2017-08-08 (×3): 667 mg via ORAL
  Filled 2017-08-07 (×4): qty 1

## 2017-08-07 MED ORDER — OXYCODONE-ACETAMINOPHEN 5-325 MG PO TABS: 1.0000 | ORAL_TABLET | ORAL | Status: DC | PRN

## 2017-08-07 MED ORDER — ALBUTEROL SULFATE (2.5 MG/3ML) 0.083% IN NEBU
2.5000 mg | INHALATION_SOLUTION | Freq: Four times a day (QID) | RESPIRATORY_TRACT | Status: DC
  Filled 2017-08-07: qty 3

## 2017-08-07 MED ORDER — HEPARIN SODIUM (PORCINE) 1000 UNIT/ML DIALYSIS: 1000.0000 [IU] | INTRAMUSCULAR | Status: DC | PRN

## 2017-08-07 MED ORDER — ALBUTEROL SULFATE (2.5 MG/3ML) 0.083% IN NEBU
5.0000 mg | INHALATION_SOLUTION | Freq: Once | RESPIRATORY_TRACT | Status: AC
  Administered 2017-08-07: 5 mg via RESPIRATORY_TRACT
  Filled 2017-08-07: qty 6

## 2017-08-07 MED ORDER — ONDANSETRON HCL 4 MG/2ML IJ SOLN
4.0000 mg | Freq: Four times a day (QID) | INTRAMUSCULAR | Status: DC | PRN
  Administered 2017-08-07: 4 mg via INTRAVENOUS
  Filled 2017-08-07: qty 2

## 2017-08-07 MED ORDER — HEPARIN SODIUM (PORCINE) 5000 UNIT/ML IJ SOLN
5000.0000 [IU] | Freq: Three times a day (TID) | INTRAMUSCULAR | Status: DC
  Administered 2017-08-07 – 2017-08-08 (×3): 5000 [IU] via SUBCUTANEOUS
  Filled 2017-08-07 (×2): qty 1

## 2017-08-07 MED ORDER — CALCITRIOL 0.25 MCG PO CAPS
0.2500 ug | ORAL_CAPSULE | ORAL | Status: DC
  Administered 2017-08-07 – 2017-08-08 (×2): 0.25 ug via ORAL
  Filled 2017-08-07 (×2): qty 1

## 2017-08-07 MED ORDER — PENTAFLUOROPROP-TETRAFLUOROETH EX AERO: 1.0000 "application " | INHALATION_SPRAY | CUTANEOUS | Status: DC | PRN

## 2017-08-07 MED ORDER — SUCROFERRIC OXYHYDROXIDE 500 MG PO CHEW
1500.0000 mg | CHEWABLE_TABLET | Freq: Three times a day (TID) | ORAL | Status: DC
  Administered 2017-08-07 – 2017-08-08 (×2): 1500 mg via ORAL
  Filled 2017-08-07 (×6): qty 3

## 2017-08-07 MED ORDER — CARVEDILOL 6.25 MG PO TABS
6.2500 mg | ORAL_TABLET | Freq: Two times a day (BID) | ORAL | Status: DC
  Administered 2017-08-07 – 2017-08-08 (×2): 6.25 mg via ORAL
  Filled 2017-08-07 (×3): qty 1

## 2017-08-07 MED ORDER — IOPAMIDOL (ISOVUE-370) INJECTION 76%
INTRAVENOUS | Status: AC
  Filled 2017-08-07: qty 100

## 2017-08-07 MED ORDER — TECHNETIUM TO 99M ALBUMIN AGGREGATED
4.2100 | Freq: Once | INTRAVENOUS | Status: AC | PRN
  Administered 2017-08-07: 4.21 via INTRAVENOUS

## 2017-08-07 MED ORDER — ONDANSETRON 4 MG PO TBDP: 4.0000 mg | ORAL_TABLET | Freq: Three times a day (TID) | ORAL | Status: DC | PRN

## 2017-08-07 MED ORDER — ALBUTEROL SULFATE (2.5 MG/3ML) 0.083% IN NEBU: 2.5000 mg | INHALATION_SOLUTION | Freq: Four times a day (QID) | RESPIRATORY_TRACT | Status: DC | PRN

## 2017-08-07 MED ORDER — INSULIN GLARGINE 100 UNIT/ML ~~LOC~~ SOLN
25.0000 [IU] | Freq: Every day | SUBCUTANEOUS | Status: DC
  Administered 2017-08-07: 25 [IU] via SUBCUTANEOUS
  Filled 2017-08-07: qty 0.25

## 2017-08-07 MED ORDER — GABAPENTIN 300 MG PO CAPS
300.0000 mg | ORAL_CAPSULE | ORAL | Status: DC
  Administered 2017-08-07 – 2017-08-08 (×2): 300 mg via ORAL
  Filled 2017-08-07 (×2): qty 1

## 2017-08-07 MED ORDER — PENTOXIFYLLINE ER 400 MG PO TBCR
400.0000 mg | EXTENDED_RELEASE_TABLET | Freq: Three times a day (TID) | ORAL | Status: DC
  Administered 2017-08-07 – 2017-08-08 (×2): 400 mg via ORAL
  Filled 2017-08-07 (×6): qty 1

## 2017-08-07 NOTE — Consult Note (Addendum)
[facility] Renal Consultation Note    Indication for Consultation:  Management of ESRD/hemodialysis, anemia, hypertension/volume, and secondary hyperparathyroidism. PCP:  HPI: Lance [name] is a 47 y.o. 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  S/p recent change from in-center to home hemodialysis. Has been training for the past few weeks, but just started on his own this week. S/p HD on Tuesday. Last evening developed acute SOB and cough. Presented to ED. CXR showing pulm edema. Labs showed  Na 134, K 4.8, WBC 9.3, Hgb 11, trop 0.06. Reports that his BP has been high and per scale, does not seem like much fluid on.   Seen in room this morning. Not requiring nasal oxygen at this time. + Mild dyspnea. No CP, nausea, vomiting, fever, chills. Has had issues with R stump healing, still with serosanguinous drainage.  Dialyzes at home with Nx Stage - 3.5hr on Mon, Tues, Thur, Fri schedule using RUE AVF.   Past Medical History:  Diagnosis Date  . Aortic stenosis    Very mild in 05/2007  . Arthritis    "right shoulder; right knee; feel like I've got it all over" (11/28/2015)  . CHF (congestive heart failure) ([place])   . Degenerative joint disease    Left TKA  . Diastolic heart failure (HCC)    LVEF 65-70% with grade 2 diastolic dysfunction  . ESRD (end stage renal disease) on dialysis ([place])    "MWF; [facility] on [address]" (11/28/2015)  . Essential hypertension, benign 2000   LVH  . GERD (gastroesophageal reflux disease)   . HCAP (healthcare-associated pneumonia) 11/28/2015  . Hyperlipidemia 2000  . Loculated pleural effusion 11/28/2015   [name] 11/28/2015  . Pneumonia 12/2013; 05/2014  . PSVT (paroxysmal supraventricular tachycardia) (HCC)    Nonsustained; asymptomatic; diagnosed by event recorder in 2006  . Tobacco abuse, in remission    20 pack years; discontinued in 1985; bronchitic changes on chest x-ray and 2009   . Type 1 diabetes mellitus ([place]) 1990   Past Surgical History:  Procedure Laterality Date  . AMPUTATION Right 06/06/2017   Procedure: RIGHT BELOW KNEE AMPUTATION;  Surgeon: [name], MD;  Location: [place];  Service: Orthopedics;  Laterality: Right;  . APPLICATION OF WOUND VAC Right 07/25/2017   Procedure: APPLICATION OF WOUND VAC;  Surgeon: [name], MD;  Location: [place];  Service: Orthopedics;  Laterality: Right;  . AV FISTULA PLACEMENT Right 06/23/2014   Procedure: ARTERIOVENOUS (AV) FISTULA CREATION;  Surgeon: Angelia Mould, MD;  Location: [place];  Service: Vascular;  Laterality: Right;  . CARDIAC CATHETERIZATION     [facility]- evaluation for Kidney/ pancreas transplant  . EYE SURGERY Bilateral    "for glaucoma"  . INGUINAL HERNIA REPAIR Right    As a child  . LOWER EXTREMITY ANGIOGRAPHY N/A 05/27/2017   Procedure: LOWER EXTREMITY ANGIOGRAPHY;  Surgeon: [name], MD;  Location: [facility];  Service: Cardiovascular;  Laterality: N/A;  . STUMP REVISION Right 07/25/2017   Procedure: REVISION RIGHT BELOW KNEE AMPUTATION;;  Surgeon: [name], MD;  Location: [place];  Service: Orthopedics;  Laterality: Right;  . WISDOM TOOTH EXTRACTION  1987   Family History  Problem Relation Age of Onset  . Diabetes Father   . Hypertension Father   . Diabetes Mother   . Hypertension Mother    Social History:  reports that he has never smoked. He has never  used smokeless tobacco. He reports that he drinks alcohol. He reports that he does not use drugs.  ROS: As per HPI otherwise negative.  Physical Exam: Vitals:   08/07/17 0230 08/07/17 0400 08/07/17 0645 08/07/17 1000  BP: (!) 168/82 137/74  (!) 151/76  Pulse: (!) 114 (!) 110 (!) 105 (!) 101  Resp: (!) 22 (!) 21 19 18   Temp:    98.3 F (36.8 C)  TempSrc:    Oral  SpO2: 95% 96% 96% (!) 89%  Weight:      Height:         General: Well developed, well nourished, in no acute distress. Head: Normocephalic,  atraumatic, sclera non-icteric, mucus membranes are moist. Neck: Supple without lymphadenopathy/masses. JVD not elevated. Lungs: Clear bilaterally to auscultation without wheezes, rales, or rhonchi. Breathing is unlabored. Heart: RRR with normal S1, S2. No murmurs, rubs, or gallops appreciated. Abdomen: Soft, non-tender, non-distended with normoactive bowel sounds.  Musculoskeletal:  Strength and tone appear normal for age. Lower extremities: No LLE, R stump with sutures/staples in place, no expressible pus. Neuro: Alert and oriented X 3. Moves all extremities spontaneously. Psych:  Responds to questions appropriately with a normal affect. Dialysis Access: RUE AVF + thrill  Allergies  Allergen Reactions  . Atorvastatin Other (See Comments)    Muscle pain   . Minoxidil Other (See Comments)    Fluid retention   . Adhesive [Tape] Other (See Comments)    MUST USE "SILK TAPE," AS ANY OTHER "BURNS" THE SKIN  . Statins Other (See Comments)    Muscle pain   Prior to Admission medications   Medication Sig Start Date End Date Taking? Authorizing Provider  acetaminophen (TYLENOL) 500 MG tablet Take 1,000 mg by mouth every 6 (six) hours as needed (for pain).   Yes [provider]  aspirin EC 325 MG EC tablet Take 1 tablet (325 mg total) by mouth daily. 06/18/17  Yes Angiulli, Lavon Paganini, PA-C  calcitRIOL (ROCALTROL) 0.25 MCG capsule Take 1 capsule (0.25 mcg total) by mouth every Monday, Wednesday, and Friday with hemodialysis. Patient taking differently: Take 0.25 mcg by mouth See admin instructions. Take 0.25 mcg by mouth once a day on Mon/Tues/Thurs/Fri with home dialysis 06/18/17  Yes Angiulli, Lavon Paganini, PA-C  calcium acetate (PHOSLO) 667 MG capsule Take 1 capsule (667 mg total) by mouth 3 (three) times daily. Patient taking differently: Take 667 mg by mouth 3 (three) times daily with meals.  06/18/17  Yes Angiulli, Lavon Paganini, PA-C  carvedilol (COREG) 3.125 MG tablet Take 1 tablet (3.125 mg  total) by mouth 2 (two) times daily with a meal. Patient taking differently: Take 6.25 mg by mouth 2 (two) times daily with a meal.  06/18/17  Yes Angiulli, Lavon Paganini, PA-C  docusate sodium (COLACE) 100 MG capsule Take 1 capsule (100 mg total) by mouth 2 (two) times daily. Patient taking differently: Take 200 mg by mouth See admin instructions. Take 200 mg by mouth once a day when taking pain meds 06/18/17  Yes Angiulli, Lavon Paganini, PA-C  gabapentin (NEURONTIN) 100 MG capsule Take 1 capsule (100 mg total) by mouth daily. Patient taking differently: Take 100 mg by mouth See admin instructions. Take 100 mg by mouth once a day on Sun/Wed/Sat 06/18/17  Yes Angiulli, Lavon Paganini, PA-C  gabapentin (NEURONTIN) 300 MG capsule Take 1 capsule (300 mg total) by mouth every Monday, Wednesday, and Friday with hemodialysis. Patient taking differently: Take 300 mg by mouth See admin instructions. Take 300 mg  by mouth once a day on Mon/Tues/Thurs/Fri 06/18/17  Yes Angiulli, Lavon Paganini, PA-C  insulin glargine (LANTUS) 100 unit/mL SOPN Inject 0.25 mLs (25 Units total) into the skin at bedtime. 07/01/17  Yes Nida, Marella Chimes, MD  Insulin Lispro (HUMALOG KWIKPEN [place]) Inject 15-20 Units into the skin 3 (three) times daily before meals.    Yes [provider]  ondansetron (ZOFRAN ODT) 4 MG disintegrating tablet Take 1 tablet (4 mg total) by mouth every 8 (eight) hours as needed for nausea or vomiting. 07/01/17  Yes [name], MD  pentoxifylline (TRENTAL) 400 MG CR tablet Take 1 tablet (400 mg total) by mouth 3 (three) times daily with meals. 06/26/17  Yes [name], MD  polyethylene glycol [facility]) packet Take 17 g by mouth daily. Patient taking differently: Take 17 g by mouth See admin instructions. Take 17 grams by mouth, mixed, once a day only when taking pain meds 07/03/17  Yes Jamse Arn, MD  sucroferric oxyhydroxide (VELPHORO) 500 MG chewable tablet Chew 3 tablets (1,500 mg total) by mouth 3 (three)  times daily. 06/18/17  Yes Angiulli, Lavon Paganini, PA-C  Continuous Blood Gluc Receiver (FREESTYLE LIBRE 14 DAY READER) DEVI 1 each by Does not apply route every 14 (fourteen) days. 07/29/17   Cassandria Anger, MD  Continuous Blood Gluc Sensor (FREESTYLE LIBRE 14 DAY SENSOR) MISC 1 each by Does not apply route every 14 (fourteen) days. 07/21/17   Cassandria Anger, MD  Darbepoetin Alfa (ARANESP) 60 MCG/0.3ML SOSY injection Inject 0.3 mLs (60 mcg total) into the vein every Monday with hemodialysis. Patient not taking: Reported on 08/07/2017 06/23/17   Angiulli, Lavon Paganini, PA-C  methocarbamol (ROBAXIN) 500 MG tablet Take 1 tablet (500 mg total) by mouth every 6 (six) hours as needed for muscle spasms. Patient not taking: Reported on 08/07/2017 06/18/17   Angiulli, Lavon Paganini, PA-C  oxyCODONE-acetaminophen (PERCOCET/ROXICET) 5-325 MG tablet Take 1 tablet by mouth every 4 (four) hours as needed. Patient not taking: Reported on 08/07/2017 07/25/17   [name], MD  pantoprazole (PROTONIX) 40 MG tablet TAKE 1 TABLET BY MOUTH EVERY DAY Patient not taking: Reported on 08/07/2017 08/06/17   [name], MD   Current Facility-Administered Medications  Medication Dose Route Frequency Provider Last Rate Last Dose  . acetaminophen (TYLENOL) tablet 1,000 mg  1,000 mg Oral Q6H PRN Black, Karen M, NP      . albuterol (PROVENTIL) (2.5 MG/3ML) 0.083% nebulizer solution 2.5 mg  2.5 mg Nebulization Q6H PRN Purohit, Konrad Dolores, MD      . aspirin EC tablet 325 mg  325 mg Oral Daily Black, Karen M, NP      . calcitRIOL (ROCALTROL) capsule 0.25 mcg  0.25 mcg Oral Once per day on Mon Tue Thu Fri Radene Gunning, NP      . calcium acetate (PHOSLO) capsule 667 mg  667 mg Oral TID WC Radene Gunning, NP   667 mg at 08/07/17 0855  . carvedilol (COREG) tablet 6.25 mg  6.25 mg Oral BID WC Radene Gunning, NP   6.25 mg at 08/07/17 0855  . Chlorhexidine Gluconate Cloth 2 % PADS 6 each  6 each Topical Q0600 Loren Racer, PA-C       . docusate sodium (COLACE) capsule 200 mg  200 mg Oral Daily PRN Black, Karen M, NP      . gabapentin (NEURONTIN) capsule 300 mg  300 mg Oral Once per day on Mon Tue  Thu Fri Radene Gunning, NP      . heparin injection 5,000 Units  5,000 Units Subcutaneous Q8H Black, Lezlie Octave, NP      . insulin aspart (novoLOG) injection 0-5 Units  0-5 Units Subcutaneous QHS Black, Karen M, NP      . insulin aspart (novoLOG) injection 0-9 Units  0-9 Units Subcutaneous TID WC Black, Karen M, NP      . insulin glargine (LANTUS) injection 25 Units  25 Units Subcutaneous QHS Black, Karen M, NP      . iopamidol (ISOVUE-370) 76 % injection           . ondansetron (ZOFRAN) tablet 4 mg  4 mg Oral Q6H PRN Radene Gunning, NP       Or  . ondansetron [facility]) injection 4 mg  4 mg Intravenous Q6H PRN Radene Gunning, NP   4 mg at 08/07/17 0854  . oxyCODONE (Oxy IR/ROXICODONE) immediate release tablet 5 mg  5 mg Oral Q4H PRN Purohit, Konrad Dolores, MD   5 mg at 08/07/17 0854  . pentoxifylline (TRENTAL) CR tablet 400 mg  400 mg Oral TID WC Black, Karen M, NP      . polyethylene glycol (MIRALAX / GLYCOLAX) packet 17 g  17 g Oral Daily PRN Black, Lezlie Octave, NP      . sucroferric oxyhydroxide (VELPHORO) chewable tablet 1,500 mg  1,500 mg Oral TID WC Black, Lezlie Octave, NP      . traZODone (DESYREL) tablet 25 mg  25 mg Oral QHS PRN Radene Gunning, NP       Labs: Basic Metabolic Panel: Recent Labs  Lab 08/07/17 0304  NA 134*  K 4.8  CL 92*  CO2 27  GLUCOSE 318*  BUN 51*  CREATININE 9.97*  CALCIUM 9.0   Liver Function Tests: Recent Labs  Lab 08/07/17 0304  AST 13*  ALT 8  ALKPHOS 100  BILITOT 0.9  PROT 7.4  ALBUMIN 3.1*   CBC: Recent Labs  Lab 08/07/17 0304  WBC 9.3  NEUTROABS 6.8  HGB 11.0*  HCT 35.6*  MCV 97.0  PLT 252   CBG: Recent Labs  Lab 08/07/17 0823  GLUCAP 442*   Studies/Results: Dg Chest 2 View  Result Date: 08/07/2017 CLINICAL DATA:  Dyspnea x1 day EXAM: CHEST - 2 VIEW COMPARISON:  12/05/2015  FINDINGS: Stable cardiomegaly. Bilateral interstitial edema with fluid in the major and minor fissures. No pulmonary consolidation. No appreciable pleural effusion or pneumothorax. A vascular graft projects over the right shoulder. No acute osseous abnormality. IMPRESSION: Cardiomegaly with mild diffuse interstitial edema. Electronically Signed   By: Ashley Royalty M.D.   On: 08/07/2017 02:01   Dialysis Orders:  [facility] on NxStage, recently started. Dr. Posey Pronto is his nephrologist. 3.5hr, EDW 103kg, AVF, + heparin.  Assessment/Plan: 1.  Dyspnea/pulmonary edema: For HD today with volume removal. Suspect he has lost some actual body weight and EDW needs to be lowered (he was not getting off enough volume). 2.  ESRD: HD today, then reassess symptoms/weight. May need another HD tomorrow. Hypertension/volume: BP slightly high, will come down with HD. 3.  Anemia: Hgb > 11. No ESA for now. 4.  Metabolic bone disease: Ca ok, continue home binders. 5.  Uncontrolled T1DM: Per primary 6.  S/p recent R BKA  Veneta Penton, Hershal Coria 08/07/2017, 11:24 AM  [facility] Pager: [phone]  Pt seen, examined and agree w A/P as above.  [name] MD [employer] pager  [phone]   08/07/2017, 1:24 PM

## 2017-08-07 NOTE — Progress Notes (Signed)
I spoke with [facility] on call about patients home medications being continued. Right now we are not adding/continuing any medications with the exception of what has already been reviewed/approved.

## 2017-08-07 NOTE — H&P (Signed)
History and Physical    TEMPLE SPORER OIZ:[id number] DOB: October 10, 1970 DOA: 08/07/2017  PCP: [name], MD Patient coming from: home  Chief Complaint: shortness of breath  HPI: Lance [name] is a 47 y.o. male with medical history significant for end-stage renal disease on dialysis, diastolic congestive heart failure, diabetes, tobacco use, hypertension, peripheral neuropathy,recent revision of right below knee amputation presents to emergency Department chief complaint sudden onset shortness of breath cough with sputum production. initial evaluation reveals respiratory distress with increased oxygen demand. [facility] are asked to admit.  Information is obtained from the patient and his wife who is at the bedside. He states about 10 days ago he underwent a revision of his right below the knee amputation. He's been doing well with that recovery but has been more sedentary than usual. He also purchased pates in-home hemodialysis on Monday Tuesday Thursday Friday. He had dialysis day before yesterday without any complications. Last night he awakened experiencing sudden shortness of breath. He states he sat on the side of bed and could feel "phlegm in my chest". He started to cough cough drop small amount take quite sputum. He states he was still unable to "catch my breath". Wife states she could hear wheezing. Wife called EMS. Patient denies chest pain palpitations headache dizziness syncope or near-syncope. He denies any abdominal pain nausea vomiting diarrhea constipation melena bright red blood per rectum. He denies any worsening lower extremity edema. He does report pain at surgery site of his right below the knee amputation and that he has "run out of pain medicine and muscle relaxer". He also reports he has not taken his diabetes or hypertensive meds last night.    ED Course: in the emergency department he is afebrile blood pressure high end of normal in respiratory  distress with an increased oxygen demand. He is provided with nebulizers and analgesia. At the time of admission there is no increased work of breathing and oxygen saturation level greater than 90% on 2 L.  Review of Systems: As per HPI otherwise all other systems reviewed and are negative.   Ambulatory Status: patient lives at home with his wife. Is independent with ADLs. Home hemodialysis Monday Tuesday Thursday Friday  Past Medical History:  Diagnosis Date  . Aortic stenosis    Very mild in 05/2007  . Arthritis    "right shoulder; right knee; feel like I've got it all over" (11/28/2015)  . CHF (congestive heart failure) ([place])   . Degenerative joint disease    Left TKA  . Diastolic heart failure (HCC)    LVEF 65-70% with grade 2 diastolic dysfunction  . ESRD (end stage renal disease) on dialysis ([place])    "MWF; [facility] on [address]" (11/28/2015)  . Essential hypertension, benign 2000   LVH  . GERD (gastroesophageal reflux disease)   . HCAP (healthcare-associated pneumonia) 11/28/2015  . Hyperlipidemia 2000  . Loculated pleural effusion 11/28/2015   [name] 11/28/2015  . Pneumonia 12/2013; 05/2014  . PSVT (paroxysmal supraventricular tachycardia) (HCC)    Nonsustained; asymptomatic; diagnosed by event recorder in 2006  . Tobacco abuse, in remission    20 pack years; discontinued in 1985; bronchitic changes on chest x-ray and 2009  . Type 1 diabetes mellitus ([place]) 1990    Past Surgical History:  Procedure Laterality Date  . AMPUTATION Right 06/06/2017   Procedure: RIGHT BELOW KNEE AMPUTATION;  Surgeon: [name], MD;  Location: [place] [facility];  Service: Orthopedics;  Laterality: Right;  .  APPLICATION OF WOUND VAC Right 07/25/2017   Procedure: APPLICATION OF WOUND VAC;  Surgeon: [name], MD;  Location: [place];  Service: Orthopedics;  Laterality: Right;  . AV FISTULA PLACEMENT Right 06/23/2014   Procedure: ARTERIOVENOUS (AV) FISTULA CREATION;  Surgeon: Angelia Mould, MD;   Location: [place];  Service: Vascular;  Laterality: Right;  . CARDIAC CATHETERIZATION     [facility]- evaluation for Kidney/ pancreas transplant  . EYE SURGERY Bilateral    "for glaucoma"  . INGUINAL HERNIA REPAIR Right    As a child  . LOWER EXTREMITY ANGIOGRAPHY N/A 05/27/2017   Procedure: LOWER EXTREMITY ANGIOGRAPHY;  Surgeon: [name], MD;  Location: [place];  Service: Cardiovascular;  Laterality: N/A;  . STUMP REVISION Right 07/25/2017   Procedure: REVISION RIGHT BELOW KNEE AMPUTATION;;  Surgeon: [name], MD;  Location: [place];  Service: Orthopedics;  Laterality: Right;  . WISDOM TOOTH EXTRACTION  1987    Social History   Socioeconomic History  . Marital status: Married    Spouse name: Not on file  . Number of children: Not on file  . Years of education: Not on file  . Highest education level: Not on file  Occupational History  . Not on file  Social Needs  . Financial resource strain: Not on file  . Food insecurity:    Worry: Not on file    Inability: Not on file  . Transportation needs:    Medical: Not on file    Non-medical: Not on file  Tobacco Use  . Smoking status: Never Smoker  . Smokeless tobacco: Never Used  Substance and Sexual Activity  . Alcohol use: Yes    Comment: 11/28/2015 "used to drink; stopped ~ 2 yr ago"  . Drug use: No  . Sexual activity: Yes  Lifestyle  . Physical activity:    Days per week: Not on file    Minutes per session: Not on file  . Stress: Not on file  Relationships  . Social connections:    Talks on phone: Not on file    Gets together: Not on file    Attends religious service: Not on file    Active member of club or organization: Not on file    Attends meetings of clubs or organizations: Not on file    Relationship status: Not on file  . Intimate partner violence:    Fear of current or ex partner: Not on file    Emotionally abused: Not on file    Physically abused: Not on file    Forced sexual activity: Not  on file  Other Topics Concern  . Not on file  Social History Narrative  . Not on file    Allergies  Allergen Reactions  . Atorvastatin Other (See Comments)    Muscle pain   . Minoxidil Other (See Comments)    Fluid retention   . Adhesive [Tape] Other (See Comments)    MUST USE "SILK TAPE," AS ANY OTHER "BURNS" THE SKIN  . Statins Other (See Comments)    Muscle pain    Family History  Problem Relation Age of Onset  . Diabetes Father   . Hypertension Father   . Diabetes Mother   . Hypertension Mother     Prior to Admission medications   Medication Sig Start Date End Date Taking? Authorizing Provider  acetaminophen (TYLENOL) 500 MG tablet Take 1,000 mg by mouth every 6 (six) hours as needed (for pain).  Yes [provider]  aspirin EC 325 MG EC tablet Take 1 tablet (325 mg total) by mouth daily. 06/18/17  Yes Angiulli, Lavon Paganini, PA-C  calcitRIOL (ROCALTROL) 0.25 MCG capsule Take 1 capsule (0.25 mcg total) by mouth every Monday, Wednesday, and Friday with hemodialysis. Patient taking differently: Take 0.25 mcg by mouth See admin instructions. Take 0.25 mcg by mouth once a day on Mon/Tues/Thurs/Fri with home dialysis 06/18/17  Yes Angiulli, Lavon Paganini, PA-C  calcium acetate (PHOSLO) 667 MG capsule Take 1 capsule (667 mg total) by mouth 3 (three) times daily. Patient taking differently: Take 667 mg by mouth 3 (three) times daily with meals.  06/18/17  Yes Angiulli, Lavon Paganini, PA-C  carvedilol (COREG) 3.125 MG tablet Take 1 tablet (3.125 mg total) by mouth 2 (two) times daily with a meal. Patient taking differently: Take 6.25 mg by mouth 2 (two) times daily with a meal.  06/18/17  Yes Angiulli, Lavon Paganini, PA-C  docusate sodium (COLACE) 100 MG capsule Take 1 capsule (100 mg total) by mouth 2 (two) times daily. Patient taking differently: Take 200 mg by mouth See admin instructions. Take 200 mg by mouth once a day when taking pain meds 06/18/17  Yes Angiulli, Lavon Paganini, PA-C  gabapentin  (NEURONTIN) 100 MG capsule Take 1 capsule (100 mg total) by mouth daily. Patient taking differently: Take 100 mg by mouth See admin instructions. Take 100 mg by mouth once a day on Sun/Wed/Sat 06/18/17  Yes Angiulli, Lavon Paganini, PA-C  gabapentin (NEURONTIN) 300 MG capsule Take 1 capsule (300 mg total) by mouth every Monday, Wednesday, and Friday with hemodialysis. Patient taking differently: Take 300 mg by mouth See admin instructions. Take 300 mg by mouth once a day on Mon/Tues/Thurs/Fri 06/18/17  Yes Angiulli, Lavon Paganini, PA-C  insulin glargine (LANTUS) 100 unit/mL SOPN Inject 0.25 mLs (25 Units total) into the skin at bedtime. 07/01/17  Yes Nida, Marella Chimes, MD  Insulin Lispro (HUMALOG KWIKPEN [place]) Inject 15-20 Units into the skin 3 (three) times daily before meals.    Yes [provider]  ondansetron (ZOFRAN ODT) 4 MG disintegrating tablet Take 1 tablet (4 mg total) by mouth every 8 (eight) hours as needed for nausea or vomiting. 07/01/17  Yes [name], MD  pentoxifylline (TRENTAL) 400 MG CR tablet Take 1 tablet (400 mg total) by mouth 3 (three) times daily with meals. 06/26/17  Yes [name], MD  polyethylene glycol [facility]) packet Take 17 g by mouth daily. Patient taking differently: Take 17 g by mouth See admin instructions. Take 17 grams by mouth, mixed, once a day only when taking pain meds 07/03/17  Yes Jamse Arn, MD  sucroferric oxyhydroxide (VELPHORO) 500 MG chewable tablet Chew 3 tablets (1,500 mg total) by mouth 3 (three) times daily. 06/18/17  Yes Angiulli, Lavon Paganini, PA-C  Continuous Blood Gluc Receiver (FREESTYLE LIBRE 14 DAY READER) DEVI 1 each by Does not apply route every 14 (fourteen) days. 07/29/17   Cassandria Anger, MD  Continuous Blood Gluc Sensor (FREESTYLE LIBRE 14 DAY SENSOR) MISC 1 each by Does not apply route every 14 (fourteen) days. 07/21/17   Cassandria Anger, MD  Darbepoetin Alfa (ARANESP) 60 MCG/0.3ML SOSY injection Inject 0.3 mLs (60 mcg  total) into the vein every Monday with hemodialysis. Patient not taking: Reported on 08/07/2017 06/23/17   Angiulli, Lavon Paganini, PA-C  methocarbamol (ROBAXIN) 500 MG tablet Take 1 tablet (500 mg total) by mouth every 6 (six) hours as needed  for muscle spasms. Patient not taking: Reported on 08/07/2017 06/18/17   Angiulli, Lavon Paganini, PA-C  oxyCODONE-acetaminophen (PERCOCET/ROXICET) 5-325 MG tablet Take 1 tablet by mouth every 4 (four) hours as needed. Patient not taking: Reported on 08/07/2017 07/25/17   [name], MD  pantoprazole (PROTONIX) 40 MG tablet TAKE 1 TABLET BY MOUTH EVERY DAY Patient not taking: Reported on 08/07/2017 08/06/17   [name], MD    Physical Exam: Vitals:   08/07/17 0215 08/07/17 0230 08/07/17 0400 08/07/17 0645  BP: (!) 174/93 (!) 168/82 137/74   Pulse: (!) 111 (!) 114 (!) 110 (!) 105  Resp: 17 (!) 22 (!) 21 19  SpO2: 100% 95% 96% 96%  Weight:      Height:         General:  Appears calm and comfortable, lying flat in bed in no acute distress Eyes:  PERRL, EOMI, normal lids, iris ENT:  grossly normal hearing, lips & tongue, mucous membranes of his mouth are moist and pink Neck:  no LAD, masses or thyromegaly Cardiovascular:  Tachycardic but regular, no m/r/g. trace LE edema on the left  Respiratory:  CTA bilaterally, no w/r/r. Normal respiratory effort. Abdomen:  soft, ntnd, positive bowel sounds throughout no guarding or rebounding Skin:  no rash or induration seen on limited exam Musculoskeletal:  grossly normal tone BUE/BLE, good ROM, no bony abnormality Psychiatric:  grossly normal mood and affect, speech fluent and appropriate, AOx3 Neurologic:  CN 2-12 grossly intact, moves all extremities in coordinated fashion, sensation intact. Speech clear facial symmetry  Labs on Admission: I have personally reviewed following labs and imaging studies  CBC: Recent Labs  Lab 08/07/17 0304  WBC 9.3  NEUTROABS 6.8  HGB 11.0*  HCT 35.6*  MCV 97.0  PLT 252     Basic Metabolic Panel: Recent Labs  Lab 08/07/17 0304  NA 134*  K 4.8  CL 92*  CO2 27  GLUCOSE 318*  BUN 51*  CREATININE 9.97*  CALCIUM 9.0   GFR: Estimated Creatinine Clearance: 11.8 mL/min (A) (by C-G formula based on SCr of 9.97 mg/dL (H)). Liver Function Tests: Recent Labs  Lab 08/07/17 0304  AST 13*  ALT 8  ALKPHOS 100  BILITOT 0.9  PROT 7.4  ALBUMIN 3.1*   No results for input(s): LIPASE, AMYLASE in the last 168 hours. No results for input(s): AMMONIA in the last 168 hours. Coagulation Profile: No results for input(s): INR, PROTIME in the last 168 hours. Cardiac Enzymes: No results for input(s): CKTOTAL, CKMB, CKMBINDEX, TROPONINI in the last 168 hours. BNP (last 3 results) No results for input(s): PROBNP in the last 8760 hours. HbA1C: No results for input(s): HGBA1C in the last 72 hours. CBG: No results for input(s): GLUCAP in the last 168 hours. Lipid Profile: No results for input(s): CHOL, HDL, LDLCALC, TRIG, CHOLHDL, LDLDIRECT in the last 72 hours. Thyroid Function Tests: No results for input(s): TSH, T4TOTAL, FREET4, T3FREE, THYROIDAB in the last 72 hours. Anemia Panel: No results for input(s): VITAMINB12, FOLATE, FERRITIN, TIBC, IRON, RETICCTPCT in the last 72 hours. Urine analysis:    Component Value Date/Time   COLORURINE YELLOW 05/05/2014 0904   APPEARANCEUR CLEAR 05/05/2014 0904   LABSPEC 1.020 05/05/2014 0904   PHURINE 6.5 05/05/2014 0904   GLUCOSEU 100 (A) 05/05/2014 0904   HGBUR LARGE (A) 05/05/2014 0904   BILIRUBINUR NEGATIVE 05/05/2014 0904   KETONESUR NEGATIVE 05/05/2014 0904   PROTEINUR >300 (A) 05/05/2014 0904   UROBILINOGEN 0.2 05/05/2014 7169  NITRITE NEGATIVE 05/05/2014 0904   LEUKOCYTESUR NEGATIVE 05/05/2014 0904    Creatinine Clearance: Estimated Creatinine Clearance: 11.8 mL/min (A) (by C-G formula based on SCr of 9.97 mg/dL (H)).  Sepsis Labs: @LABRCNTIP (procalcitonin:4,lacticidven:4) )No results found for this or  any previous visit (from the past 240 hour(s)).   Radiological Exams on Admission: Dg Chest 2 View  Result Date: 08/07/2017 CLINICAL DATA:  Dyspnea x1 day EXAM: CHEST - 2 VIEW COMPARISON:  12/05/2015 FINDINGS: Stable cardiomegaly. Bilateral interstitial edema with fluid in the major and minor fissures. No pulmonary consolidation. No appreciable pleural effusion or pneumothorax. A vascular graft projects over the right shoulder. No acute osseous abnormality. IMPRESSION: Cardiomegaly with mild diffuse interstitial edema. Electronically Signed   By: Ashley Royalty M.D.   On: 08/07/2017 02:01    EKG: Independently reviewed. Sinus tachycardia Probable left atrial enlargement Probable left ventricular hypertrophy Nonspecific T abnormalities, inferior leads  Assessment/Plan Principal Problem:   Respiratory distress Active Problems:   ESRD (end stage renal disease) on dialysis (HCC)   Anemia of chronic disease   Tachycardia   Amputation of right lower extremity below knee (HCC)   Poorly controlled type 2 diabetes mellitus with peripheral neuropathy (HCC)   Benign essential HTN   Chronic diastolic (congestive) heart failure ([place])   #1. Respiratory distress with increased oxygen demand. Oxygen saturation level 89% on room air at presentation.  May be related to volume overload and/or flash edema secondary to uncontrolled BP. Given his recent procedure is also at risk for PE. Chest x-ray with bilateral interstitial edema with fluid in the major and minor fissures. -admit -follow VQ scan -nephrology consult for dialysis evaluation -continue oxygen supplementation -wean oxygen as able.   #2. End-stage renal disease on home hemodialysis. His dialysis schedule is Monday Tuesday Thursday Friday. Denies any complications with dialysis and his last treatment day before yesterday was 3-1/2 hours. Creatinine 9.97 on admission. Potassium level 4.8 -Nephrology consult for dialysis -Continue home meds  #3.  Hypertension/tachycardia. patient has not taken his antihypertensive medications last night or today. Troponin negative. EKG as noted above.home medications include Coreg -continue Coreg -monitor  #4. Diabetes. Serum glucose 318 on admission. Recent hemoglobin A1c 6.7. Home medications include Lantus -continue home lantus -SSI for optimal control -carb modified diet  #5. Chronic diastolic heart failure. See #1.echo 2017 revealed an EF of 50% with mild LVH. Medications include Coreg. -Continue home meds -nephrology consult as noted above  #6. Amputation of the right lower extremity below the knee. Status post revision last week. Stable -continue analgesia  -supportive therapy   DVT prophylaxis: heparin  Code Status: full  Family Communication: wife at bedside  Disposition Plan: home hopefully tomorrow  Consults called:  [place] nephrology Admission status: inpatient    Radene Gunning MD [facility]  If 7PM-7AM, please contact night-coverage [email or web address] Password [email or web address]  08/07/2017, 7:30 AM

## 2017-08-07 NOTE — ED Triage Notes (Signed)
Patient BIB [facility] for difficulty breathing. Patient reports laying in bed and started "wheezing and couldn't breath". Before EMS arrived patient coughed up "clear mucus plus". Recent RIGHT BKA. Dialysis patient M,T,TH, F at home. CBG 293. Denies chest pain, nausea, or vomiting.   Has not taken DM or HTN meds tonight.

## 2017-08-07 NOTE — ED Provider Notes (Signed)
[facility] EMERGENCY DEPARTMENT Provider Note   CSN: [id number] Arrival date & time:        History   Chief Complaint Chief Complaint  Patient presents with  . Shortness of Breath    HPI Lance [name] is a 47 y.o. male.  Patient presents to the emergency department for evaluation of difficulty breathing.  Patient reports that he was lying in bed when he suddenly felt like he could not breathe well.  He sat up on the end of the bed and started having coughing spell that brought up clear sputum.  He coughed violently and then did vomit one time.  After that he felt like his lungs were "rattling and wheezing".  Patient is a dialysis patient.  He does home hemodialysis Monday Tuesday Thursday Friday.  He did a 3-1/2-hour session yesterday.  There were no complications or difficulties.  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     Past Medical History:  Diagnosis Date  . Aortic stenosis    Very mild in 05/2007  . Arthritis    "right shoulder; right knee; feel like I've got it all over" (11/28/2015)  . CHF (congestive heart failure) ([place])   . Degenerative joint disease    Left TKA  . Diastolic heart failure (HCC)    LVEF 65-70% with grade 2 diastolic dysfunction  . ESRD (end stage renal disease) on dialysis ([place])    "MWF; [facility] on [address]" (11/28/2015)  . Essential hypertension, benign 2000   LVH  . GERD (gastroesophageal reflux disease)   . HCAP (healthcare-associated pneumonia) 11/28/2015  . Hyperlipidemia 2000  . Loculated pleural effusion 11/28/2015   [name] 11/28/2015  . Pneumonia 12/2013; 05/2014  . PSVT (paroxysmal supraventricular tachycardia) (HCC)    Nonsustained; asymptomatic; diagnosed by event recorder in 2006  . Tobacco abuse, in remission    20 pack years; discontinued in 1985; bronchitic changes on chest x-ray and 2009  . Type 1 diabetes mellitus ([place]) 1990     Patient Active Problem List   Diagnosis Date Noted  . Acute respiratory failure with hypoxia ([place]) 08/07/2017  . Respiratory distress 08/07/2017  . Dehiscence of amputation stump ([place]) 07/17/2017  . Benign essential HTN   . Chronic diastolic (congestive) heart failure ([place])   . Labile blood glucose   . Labile blood pressure   . Vomiting   . Phantom limb pain ([place])   . Poorly controlled type 2 diabetes mellitus with peripheral neuropathy ([place])   . Unilateral complete BKA, right, sequela ([place])   . Amputation of right lower extremity below knee ([place]) 06/10/2017  . ESRD on dialysis ([place])   . Chronic diastolic congestive heart failure ([place])   . History of PSVT (paroxysmal supraventricular tachycardia)   . Diabetes mellitus type 2 in nonobese (HCC)   . Post-operative pain   . Acute blood loss anemia   . Anemia of chronic disease   . Leukocytosis   . Tachycardia   . History of right below knee amputation ([place]) 06/06/2017  . PVD (peripheral vascular disease) ([place]) 05/13/2017  . Bilateral pleural effusion 11/28/2015  . Loculated pleural effusion 11/28/2015  . HCAP (healthcare-associated pneumonia) 11/28/2015  . ESRD (end stage renal disease) on dialysis ([place]) 11/12/2015  . Hyperkalemia 11/12/2015  . Shoulder pain, acute 11/12/2015  . Nausea & vomiting 11/12/2015  . Diarrhea 11/12/2015  . Hypertensive urgency 05/05/2014  . Nephrotic syndrome 12/29/2013  .  Hyponatremia 12/28/2013  . Acute diastolic heart failure ([place]) 12/28/2013  . Hypoglycemia 12/28/2013  . Dyspnea   . Acute renal failure syndrome ([place])   . Diabetic ketoacidosis without coma associated with type 1 diabetes mellitus ([place])   . Demand ischemia ([place]) 12/24/2013  . DKA (diabetic ketoacidoses) ([place]) 12/23/2013  . CAP (community acquired pneumonia) 12/23/2013  . Sepsis due to pneumonia ([place]) 12/23/2013  . Metabolic acidemia 13/24/4010  . Acute renal failure ([place]) 12/23/2013  . Diabetic ketoacidosis ([place])  12/23/2013  . Chronic diastolic heart failure ([place]) 04/14/2013  . PSVT (paroxysmal supraventricular tachycardia) ([place]) 04/14/2013  . Bilateral leg edema 03/26/2013  . Chronic kidney disease, stage [name] (moderate) ([place]) 09/29/2011  . Type 1 diabetes mellitus with end-stage renal disease ([place]) 09/26/2010  . Mixed hyperlipidemia 09/26/2010    Past Surgical History:  Procedure Laterality Date  . AMPUTATION Right 06/06/2017   Procedure: RIGHT BELOW KNEE AMPUTATION;  Surgeon: [name], MD;  Location: [place];  Service: Orthopedics;  Laterality: Right;  . APPLICATION OF WOUND VAC Right 07/25/2017   Procedure: APPLICATION OF WOUND VAC;  Surgeon: [name], MD;  Location: [facility];  Service: Orthopedics;  Laterality: Right;  . AV FISTULA PLACEMENT Right 06/23/2014   Procedure: ARTERIOVENOUS (AV) FISTULA CREATION;  Surgeon: Angelia Mould, MD;  Location: [facility];  Service: Vascular;  Laterality: Right;  . CARDIAC CATHETERIZATION     [facility]- evaluation for Kidney/ pancreas transplant  . EYE SURGERY Bilateral    "for glaucoma"  . INGUINAL HERNIA REPAIR Right    As a child  . LOWER EXTREMITY ANGIOGRAPHY N/A 05/27/2017   Procedure: LOWER EXTREMITY ANGIOGRAPHY;  Surgeon: [name], MD;  Location: [facility];  Service: Cardiovascular;  Laterality: N/A;  . STUMP REVISION Right 07/25/2017   Procedure: REVISION RIGHT BELOW KNEE AMPUTATION;;  Surgeon: [name], MD;  Location: [facility];  Service: Orthopedics;  Laterality: Right;  . [place] Medications    Prior to Admission medications   Medication Sig Start Date End Date Taking? Authorizing Provider  acetaminophen (TYLENOL) 500 MG tablet Take 1,000 mg by mouth every 6 (six) hours as needed (for pain).   Yes [provider]  aspirin EC 325 MG EC tablet Take 1 tablet (325 mg total) by mouth daily. 06/18/17  Yes Angiulli, Lavon Paganini, PA-C  calcitRIOL (ROCALTROL) 0.25 MCG capsule Take 1  capsule (0.25 mcg total) by mouth every Monday, Wednesday, and Friday with hemodialysis. Patient taking differently: Take 0.25 mcg by mouth See admin instructions. Take 0.25 mcg by mouth once a day on Mon/Tues/Thurs/Fri with home dialysis 06/18/17  Yes Angiulli, Lavon Paganini, PA-C  calcium acetate (PHOSLO) 667 MG capsule Take 1 capsule (667 mg total) by mouth 3 (three) times daily. Patient taking differently: Take 667 mg by mouth 3 (three) times daily with meals.  06/18/17  Yes Angiulli, Lavon Paganini, PA-C  carvedilol (COREG) 3.125 MG tablet Take 1 tablet (3.125 mg total) by mouth 2 (two) times daily with a meal. Patient taking differently: Take 6.25 mg by mouth 2 (two) times daily with a meal.  06/18/17  Yes Angiulli, Lavon Paganini, PA-C  docusate sodium (COLACE) 100 MG capsule Take 1 capsule (100 mg total) by mouth 2 (two) times daily. Patient taking differently: Take 200 mg by mouth See admin instructions. Take 200 mg by mouth once a day when taking pain meds 06/18/17  Yes Angiulli, Lavon Paganini,  PA-C  gabapentin (NEURONTIN) 100 MG capsule Take 1 capsule (100 mg total) by mouth daily. Patient taking differently: Take 100 mg by mouth See admin instructions. Take 100 mg by mouth once a day on Sun/Wed/Sat 06/18/17  Yes Angiulli, Lavon Paganini, PA-C  gabapentin (NEURONTIN) 300 MG capsule Take 1 capsule (300 mg total) by mouth every Monday, Wednesday, and Friday with hemodialysis. Patient taking differently: Take 300 mg by mouth See admin instructions. Take 300 mg by mouth once a day on Mon/Tues/Thurs/Fri 06/18/17  Yes Angiulli, Lavon Paganini, PA-C  insulin glargine (LANTUS) 100 unit/mL SOPN Inject 0.25 mLs (25 Units total) into the skin at bedtime. 07/01/17  Yes Nida, Marella Chimes, MD  Insulin Lispro (HUMALOG KWIKPEN ) Inject 15-20 Units into the skin 3 (three) times daily before meals.    Yes [provider]  ondansetron (ZOFRAN ODT) 4 MG disintegrating tablet Take 1 tablet (4 mg total) by mouth every 8 (eight) hours as needed  for nausea or vomiting. 07/01/17  Yes [name], MD  pentoxifylline (TRENTAL) 400 MG CR tablet Take 1 tablet (400 mg total) by mouth 3 (three) times daily with meals. 06/26/17  Yes [name], MD  polyethylene glycol [facility]) packet Take 17 g by mouth daily. Patient taking differently: Take 17 g by mouth See admin instructions. Take 17 grams by mouth, mixed, once a day only when taking pain meds 07/03/17  Yes Jamse Arn, MD  sucroferric oxyhydroxide (VELPHORO) 500 MG chewable tablet Chew 3 tablets (1,500 mg total) by mouth 3 (three) times daily. 06/18/17  Yes Angiulli, Lavon Paganini, PA-C  Continuous Blood Gluc Receiver (FREESTYLE LIBRE 14 DAY READER) DEVI 1 each by Does not apply route every 14 (fourteen) days. 07/29/17   Cassandria Anger, MD  Continuous Blood Gluc Sensor (FREESTYLE LIBRE 14 DAY SENSOR) MISC 1 each by Does not apply route every 14 (fourteen) days. 07/21/17   Cassandria Anger, MD  Darbepoetin Alfa (ARANESP) 60 MCG/0.3ML SOSY injection Inject 0.3 mLs (60 mcg total) into the vein every Monday with hemodialysis. Patient not taking: Reported on 08/07/2017 06/23/17   Angiulli, Lavon Paganini, PA-C  methocarbamol (ROBAXIN) 500 MG tablet Take 1 tablet (500 mg total) by mouth every 6 (six) hours as needed for muscle spasms. Patient not taking: Reported on 08/07/2017 06/18/17   Angiulli, Lavon Paganini, PA-C  oxyCODONE-acetaminophen (PERCOCET/ROXICET) 5-325 MG tablet Take 1 tablet by mouth every 4 (four) hours as needed. Patient not taking: Reported on 08/07/2017 07/25/17   [name], MD  pantoprazole (PROTONIX) 40 MG tablet TAKE 1 TABLET BY MOUTH EVERY DAY Patient not taking: Reported on 08/07/2017 08/06/17   [name], MD    Family History Family History  Problem Relation Age of Onset  . Diabetes Father   . Hypertension Father   . Diabetes Mother   . Hypertension Mother     Social History Social History   Tobacco Use  . Smoking status: Never Smoker  . Smokeless  tobacco: Never Used  Substance Use Topics  . Alcohol use: Yes    Comment: 11/28/2015 "used to drink; stopped ~ 2 yr ago"  . Drug use: No     Allergies   Atorvastatin; Minoxidil; Adhesive [tape]; and Statins   Review of Systems Review of Systems  Respiratory: Positive for cough and shortness of breath.   All other systems reviewed and are negative.    Physical Exam Updated Vital Signs BP (!) 142/88 (BP Location: Left Arm)   Pulse  96   Temp 98.1 F (36.7 C) (Oral)   Resp 16   Ht 6\' 3"  (1.905 m)   Wt 105.2 kg (231 lb 14.8 oz)   SpO2 97%   BMI 28.99 kg/m   Physical Exam  Constitutional: He is oriented to person, place, and time. He appears well-developed and well-nourished. No distress.  HENT:  Head: Normocephalic and atraumatic.  Right Ear: Hearing normal.  Left Ear: Hearing normal.  Nose: Nose normal.  Mouth/Throat: Oropharynx is clear and moist and mucous membranes are normal.  Eyes: Pupils are equal, round, and reactive to light. Conjunctivae and EOM are normal.  Neck: Normal range of motion. Neck supple.  Cardiovascular: Regular rhythm, S1 normal and S2 normal. Exam reveals no gallop and no friction rub.  No murmur heard. Pulmonary/Chest: Effort normal and breath sounds normal. No respiratory distress. He exhibits no tenderness.  Abdominal: Soft. Normal appearance and bowel sounds are normal. There is no hepatosplenomegaly. There is no tenderness. There is no rebound, no guarding, no tenderness at McBurney's point and negative Murphy's sign. No hernia.  Musculoskeletal: Normal range of motion.  Neurological: He is alert and oriented to person, place, and time. He has normal strength. No cranial nerve deficit or sensory deficit. Coordination normal. GCS eye subscore is 4. GCS verbal subscore is 5. GCS motor subscore is 6.  Skin: Skin is warm, dry and intact. No rash noted. No cyanosis.  Psychiatric: He has a normal mood and affect. His speech is normal and behavior is  normal. Thought content normal.  Nursing note and vitals reviewed.    ED Treatments / Results  Labs (all labs ordered are listed, but only abnormal results are displayed) Labs Reviewed  CBC WITH DIFFERENTIAL/PLATELET - Abnormal; Notable for the following components:      Result Value   RBC 3.67 (*)    Hemoglobin 11.0 (*)    HCT 35.6 (*)    RDW 17.2 (*)    All other components within normal limits  COMPREHENSIVE METABOLIC PANEL - Abnormal; Notable for the following components:   Sodium 134 (*)    Chloride 92 (*)    Glucose, Bld 318 (*)    BUN 51 (*)    Creatinine, Ser 9.97 (*)    Albumin 3.1 (*)    AST 13 (*)    GFR calc non Af Amer 5 (*)    GFR calc Af Amer 6 (*)    All other components within normal limits  D-DIMER, QUANTITATIVE (NOT AT [facility]) - Abnormal; Notable for the following components:   D-Dimer, Quant 1.73 (*)    All other components within normal limits  GLUCOSE, CAPILLARY - Abnormal; Notable for the following components:   Glucose-Capillary 442 (*)    All other components within normal limits  GLUCOSE, CAPILLARY - Abnormal; Notable for the following components:   Glucose-Capillary 471 (*)    All other components within normal limits  GLUCOSE, CAPILLARY - Abnormal; Notable for the following components:   Glucose-Capillary 170 (*)    All other components within normal limits  HIV ANTIBODY (ROUTINE TESTING)  HEPATITIS B SURFACE ANTIGEN  HEPATITIS B CORE ANTIBODY, IGM  COMPREHENSIVE METABOLIC PANEL  I-STAT CG4 LACTIC ACID, ED  I-STAT TROPONIN, ED    EKG EKG Interpretation  Date/Time:  Thursday August 07 2017 00:45:51 EDT Ventricular Rate:  115 PR Interval:    QRS Duration: 93 QT Interval:  340 QTC Calculation: 471 R Axis:   86 Text Interpretation:  Sinus tachycardia Probable  left atrial enlargement Probable left ventricular hypertrophy Nonspecific T abnormalities, inferior leads Confirmed by Orpah Greek [phone]) on 08/07/2017 12:57:11 AM Also  confirmed by Orpah Greek [phone]), editor Hattie Perch [phone])  on 08/07/2017 8:02:19 AM   Radiology Dg Chest 2 View  Result Date: 08/07/2017 CLINICAL DATA:  Dyspnea x1 day EXAM: CHEST - 2 VIEW COMPARISON:  12/05/2015 FINDINGS: Stable cardiomegaly. Bilateral interstitial edema with fluid in the major and minor fissures. No pulmonary consolidation. No appreciable pleural effusion or pneumothorax. A vascular graft projects over the right shoulder. No acute osseous abnormality. IMPRESSION: Cardiomegaly with mild diffuse interstitial edema. Electronically Signed   By: Ashley Royalty M.D.   On: 08/07/2017 02:01   Nm Pulmonary Vent And Perf (v/q Scan)  Result Date: 08/07/2017 CLINICAL DATA:  NM V/Q scan due to PE suspected Administered 32.4 mCi Tc-14m DTPA aerosol for lung ventilation and imaged immediately. Administered 4.21 mCi Tc-76m MAA for lung perfusion and imaged immediately. EXAM: NUCLEAR MEDICINE VENTILATION - PERFUSION LUNG SCAN TECHNIQUE: Ventilation images were obtained in multiple projections using inhaled aerosol Tc-74m DTPA. Perfusion images were obtained in multiple projections after intravenous injection of Tc-66m-MAA. RADIOPHARMACEUTICALS:  32.4 mCi of Tc-18m DTPA aerosol inhalation and 4.21 mCi Tc80m-MAA IV COMPARISON:  Chest x-ray 08/07/2017 the FINDINGS: Ventilation: No focal ventilation defect. Perfusion: No wedge shaped peripheral perfusion defects to suggest acute pulmonary embolism. IMPRESSION: Normal V/Q scan. Electronically Signed   By: Nolon Nations M.D.   On: 08/07/2017 13:09    Procedures Procedures (including critical care time)  Medications Ordered in ED Medications  iopamidol (ISOVUE-370) 76 % injection (has no administration in time range)  acetaminophen (TYLENOL) tablet 1,000 mg (has no administration in time range)  aspirin EC tablet 325 mg (325 mg Oral Given 08/07/17 1340)  calcitRIOL (ROCALTROL) capsule 0.25 mcg (0.25 mcg Oral Given 08/07/17 1340)   calcium acetate (PHOSLO) capsule 667 mg (667 mg Oral Given 08/07/17 1339)  carvedilol (COREG) tablet 6.25 mg (6.25 mg Oral Given 08/07/17 0855)  docusate sodium (COLACE) capsule 200 mg (has no administration in time range)  gabapentin (NEURONTIN) capsule 300 mg (300 mg Oral Given 08/07/17 1346)  insulin glargine (LANTUS) injection 25 Units (25 Units Subcutaneous Given 08/07/17 2147)  pentoxifylline (TRENTAL) CR tablet 400 mg (400 mg Oral Given 08/07/17 1341)  polyethylene glycol (MIRALAX / GLYCOLAX) packet 17 g (has no administration in time range)  sucroferric oxyhydroxide (VELPHORO) chewable tablet 1,500 mg (1,500 mg Oral Given 08/07/17 1343)  heparin injection 5,000 Units (5,000 Units Subcutaneous Given 08/08/17 0652)  traZODone (DESYREL) tablet 25 mg (25 mg Oral Given 08/07/17 2248)  ondansetron (ZOFRAN) tablet 4 mg ( Oral See Alternative 08/07/17 0854)    Or  ondansetron (ZOFRAN) injection 4 mg (4 mg Intravenous Given 08/07/17 0854)  insulin aspart (novoLOG) injection 0-9 Units (9 Units Subcutaneous Given 08/07/17 1340)  insulin aspart (novoLOG) injection 0-5 Units (0 Units Subcutaneous Not Given 08/07/17 2143)  oxyCODONE (Oxy IR/ROXICODONE) immediate release tablet 5 mg (5 mg Oral Given 08/07/17 2248)  albuterol (PROVENTIL) (2.5 MG/3ML) 0.083% nebulizer solution 2.5 mg (has no administration in time range)  Chlorhexidine Gluconate Cloth 2 % PADS 6 each (6 each Topical Given 08/07/17 1340)  albuterol (PROVENTIL) (2.5 MG/3ML) 0.083% nebulizer solution 5 mg (5 mg Nebulization Given 08/07/17 0159)  iopamidol (ISOVUE-370) 76 % injection 100 mL (100 mLs Intravenous Contrast Given 08/07/17 0418)  insulin aspart (novoLOG) injection 10 Units (10 Units Subcutaneous Given 08/07/17 0855)  technetium TC 52M diethylenetriame-pentaacetic acid (DTPA) injection  16.3 millicurie (84.6 millicuries Inhalation Given 08/07/17 1203)  technetium albumin aggregated (MAA) injection solution 6.59 millicurie (9.35 millicuries  Intravenous Contrast Given 08/07/17 1225)     Initial Impression / Assessment and Plan / ED Course  I have reviewed the triage vital signs and the nursing notes.  Pertinent labs & imaging results that were available during my care of the patient were reviewed by me and considered in my medical decision making (see chart for details).     Patient presents to the emergency department for evaluation of shortness of breath.  Patient reports a fairly sudden onset of difficulty breathing that occurred around midnight.  He reports that he sat up on the edge of the bed and could not catch his breath.  He felt like there was mucus and phlegm in his chest, coughed but could not get much up.  He then began to bring some clear phlegm up and had some rattling in his chest.  Patient presented to the ER at this point, continues to be short of breath and have a slight oxygen demand.  Patient noted to be mildly tachypneic as well as tachycardic.  Patient does have interstitial edema noted on chest x-ray.  This is likely volume overload, patient is end-stage renal disease on hemodialysis.  He did home dialysis yesterday.  Suspect that his shortness of breath is secondary to volume overload, however, he did have surgery 10 days ago.  PE is therefore considered.  Unfortunately, patient's IV infiltrated and was given 40 to 50 mL of saline through the IV after infiltration in preparation for CT scan.  Patient had a moderate amount of swelling and there was no possibility of replacing the IV.  He has a fistula in the other arm.  CT angiography therefore not possible.  Based on his new oxygen demand and level of dyspnea, will admit for possible dialysis.  Will require VQ scan this morning to determine if he has a PE.  Final Clinical Impressions(s) / ED Diagnoses   Final diagnoses:  Respiratory distress    ED Discharge Orders    None       Kodey Xue, Gwenyth Allegra, MD 08/08/17 [phone]

## 2017-08-07 NOTE — Procedures (Signed)
   I was present at this dialysis session, have reviewed the session itself and made  appropriate changes [name] MD [place] pager [phone]   08/07/2017, 4:01 PM

## 2017-08-08 DIAGNOSIS — Z992 Dependence on renal dialysis: Secondary | ICD-10-CM | POA: Diagnosis not present

## 2017-08-08 DIAGNOSIS — N186 End stage renal disease: Secondary | ICD-10-CM

## 2017-08-08 DIAGNOSIS — N2581 Secondary hyperparathyroidism of renal origin: Secondary | ICD-10-CM | POA: Diagnosis not present

## 2017-08-08 LAB — COMPREHENSIVE METABOLIC PANEL
ALK PHOS: 92 U/L (ref 38–126)
ALT: 8 U/L (ref 0–44)
AST: 12 U/L — AB (ref 15–41)
Albumin: 2.6 g/dL — ABNORMAL LOW (ref 3.5–5.0)
Anion gap: 10 (ref 5–15)
BILIRUBIN TOTAL: 0.6 mg/dL (ref 0.3–1.2)
BUN: 37 mg/dL — AB (ref 6–20)
CALCIUM: 8.3 mg/dL — AB (ref 8.9–10.3)
CO2: 28 mmol/L (ref 22–32)
Chloride: 96 mmol/L — ABNORMAL LOW (ref 98–111)
Creatinine, Ser: 7.92 mg/dL — ABNORMAL HIGH (ref 0.61–1.24)
GFR calc Af Amer: 8 mL/min — ABNORMAL LOW (ref >60)
GFR, EST NON AFRICAN AMERICAN: 7 mL/min — AB (ref >60)
Glucose, Bld: 347 mg/dL — ABNORMAL HIGH (ref 70–99)
Potassium: 5 mmol/L (ref 3.5–5.1)
Sodium: 134 mmol/L — ABNORMAL LOW (ref 135–145)
TOTAL PROTEIN: 6.5 g/dL (ref 6.5–8.1)

## 2017-08-08 LAB — GLUCOSE, CAPILLARY: Glucose-Capillary: 317 mg/dL — ABNORMAL HIGH (ref 70–99)

## 2017-08-08 LAB — HIV ANTIBODY (ROUTINE TESTING W REFLEX): HIV SCREEN 4TH GENERATION: NONREACTIVE

## 2017-08-08 LAB — HEPATITIS B CORE ANTIBODY, IGM: Hep B C IgM: NEGATIVE

## 2017-08-08 MED ORDER — OXYCODONE-ACETAMINOPHEN 5-325 MG PO TABS: 1.0000 | ORAL_TABLET | Freq: Four times a day (QID) | ORAL | 0 refills | Status: DC | PRN

## 2017-08-08 MED ORDER — PANTOPRAZOLE SODIUM 40 MG PO TBEC: 40.0000 mg | DELAYED_RELEASE_TABLET | Freq: Every day | ORAL | 0 refills | Status: DC

## 2017-08-08 MED ORDER — METHOCARBAMOL 500 MG PO TABS: 500.0000 mg | ORAL_TABLET | Freq: Four times a day (QID) | ORAL | 0 refills | Status: DC | PRN

## 2017-08-08 NOTE — Progress Notes (Signed)
Inpatient Diabetes Program Recommendations  AACE/ADA: New Consensus Statement on Inpatient Glycemic Control (2015)  Target Ranges:  Prepandial:   less than 140 mg/dL      Peak postprandial:   less than 180 mg/dL (1-2 hours)      Critically ill patients:  140 - 180 mg/dL   Results for Lance [name], Lance [name] (MRN [id number]) as of 08/08/2017 10:14  Ref. Range 08/07/2017 08:23 08/07/2017 13:07 08/07/2017 21:13 08/08/2017 08:08  Glucose-Capillary Latest Ref Range: 70 - 99 mg/dL 442 (H) 471 (H) 170 (H) 317 (H)   Review of Glycemic Control  Diabetes history: DM 2 Outpatient Diabetes medications: Lantus 25 units qhs, Humalog 15-20 units tid with meals, Freestyle libre Current orders for Inpatient glycemic control: Lantus 25 units, Novolog 0-9 units tid + Novolog HS scale  A1c 6.7 % on 6/14  Inpatient Diabetes Program Recommendations:    Glucose trends elevated on current regimen. Consider increasing Lantus to 30 units. Patient takes meal coverage at home. Please consider Novolog 4 units tid if patient consumes at least 50% of meal.  Thanks,  Tama Headings RN, MSN, BC-ADM, [facility] Inpatient Diabetes Coordinator Team Pager [phone] (8a-5p)

## 2017-08-08 NOTE — Discharge Summary (Signed)
[facility]  Physician Discharge Summary   Patient ID: Lance [name] MRN: [id number] DOB/AGE: [birth year] 47 y.o.  Admit date: 08/07/2017 Discharge date: 08/08/2017  PCP: [name], MD  DISCHARGE DIAGNOSES:  End-stage renal disease on home hemodialysis   RECOMMENDATIONS FOR OUTPATIENT FOLLOW UP: 1. Patient may continue with his home hemodialysis regimen   DISCHARGE CONDITION: fair  Diet recommendation: As before  Filed Weights   08/07/17 0038 08/07/17 1400  Weight: 103 kg (227 lb) 105.2 kg (231 lb 14.8 oz)    INITIAL HISTORY: 47 year old African-American male with a past medical history of end-stage renal disease on home hemodialysis, type 2 diabetes on insulin, peripheral neuropathy, status post recent right below-knee amputation presented with shortness of breath.  He was found to have pulmonary edema.  VQ scan was negative for PE.  Patient was hospitalized.  Consultations:  Nephrology  Procedures:  Hemodialysis   HOSPITAL COURSE:   Acute respiratory failure with hypoxia Secondary to pulmonary edema.  Patient was dialyzed.  Respiratory status is now back to normal.  He is no longer hypoxic.  End-stage renal disease on home hemodialysis His schedule is Monday Tuesday Thursday and Friday.  Patient seen by nephrology.  He underwent hemodialysis in the hospital.  He is back to his baseline now.  Apparently there appears to be some issue with his home dialysis regimen which could have resulted in fluid overload.  This is being addressed by nephrology.  They have cleared him for discharge home today.  History of diabetes mellitus type 2, uncontrolled CBGs noted to be elevated.  He has known history of uncontrolled diabetes.  History of chronic diastolic CHF Volume being managed with hemodialysis.  Recent right below-knee amputation This was done by Dr. Sharol Given.  He supposed to have follow-up with him on July 8.  Patient requesting pain medications  as well as muscle relaxants.  The [facility] prescriber database was checked.  He was given a 5-day supply earlier this month.  It is reasonable to give him another prescription until he can follow-up with the surgeon.    Patient appears to be stable.  Patient remains stable.  Discussed with nephrology.  Okay for discharge home today.     PERTINENT LABS:  The results of significant diagnostics from this hospitalization (including imaging, microbiology, ancillary and laboratory) are listed below for reference.      Labs: Basic Metabolic Panel: Recent Labs  Lab 08/07/17 0304 08/08/17 0631  NA 134* 134*  K 4.8 5.0  CL 92* 96*  CO2 27 28  GLUCOSE 318* 347*  BUN 51* 37*  CREATININE 9.97* 7.92*  CALCIUM 9.0 8.3*   Liver Function Tests: Recent Labs  Lab 08/07/17 0304 08/08/17 0631  AST 13* 12*  ALT 8 8  ALKPHOS 100 92  BILITOT 0.9 0.6  PROT 7.4 6.5  ALBUMIN 3.1* 2.6*   CBC: Recent Labs  Lab 08/07/17 0304  WBC 9.3  NEUTROABS 6.8  HGB 11.0*  HCT 35.6*  MCV 97.0  PLT 252    CBG: Recent Labs  Lab 08/07/17 0823 08/07/17 1307 08/07/17 2113 08/08/17 0808  GLUCAP 442* 471* 170* 317*     IMAGING STUDIES Dg Chest 2 View  Result Date: 08/07/2017 CLINICAL DATA:  Dyspnea x1 day EXAM: CHEST - 2 VIEW COMPARISON:  12/05/2015 FINDINGS: Stable cardiomegaly. Bilateral interstitial edema with fluid in the major and minor fissures. No pulmonary consolidation. No appreciable pleural effusion or pneumothorax. A vascular graft projects over the right shoulder.  No acute osseous abnormality. IMPRESSION: Cardiomegaly with mild diffuse interstitial edema. Electronically Signed   By: Ashley Royalty M.D.   On: 08/07/2017 02:01   Nm Pulmonary Vent And Perf (v/q Scan)  Result Date: 08/07/2017 CLINICAL DATA:  NM V/Q scan due to PE suspected Administered 32.4 mCi Tc-25m DTPA aerosol for lung ventilation and imaged immediately. Administered 4.21 mCi Tc-50m MAA for lung perfusion and  imaged immediately. EXAM: NUCLEAR MEDICINE VENTILATION - PERFUSION LUNG SCAN TECHNIQUE: Ventilation images were obtained in multiple projections using inhaled aerosol Tc-52m DTPA. Perfusion images were obtained in multiple projections after intravenous injection of Tc-44m-MAA. RADIOPHARMACEUTICALS:  32.4 mCi of Tc-88m DTPA aerosol inhalation and 4.21 mCi Tc16m-MAA IV COMPARISON:  Chest x-ray 08/07/2017 the FINDINGS: Ventilation: No focal ventilation defect. Perfusion: No wedge shaped peripheral perfusion defects to suggest acute pulmonary embolism. IMPRESSION: Normal V/Q scan. Electronically Signed   By: Nolon Nations M.D.   On: 08/07/2017 13:09    DISCHARGE EXAMINATION: Vitals:   08/07/17 1830 08/07/17 2013 08/07/17 2328 08/08/17 0427  BP: (!) 144/89 117/68 (!) 157/88 (!) 142/88  Pulse: 93 99 (!) 102 96  Resp: 18 17 16    Temp:  98.6 F (37 C) 98.5 F (36.9 C) 98.1 F (36.7 C)  TempSrc:  Oral Oral Oral  SpO2: 100% 94% 95% 97%  Weight:      Height:       General appearance: alert, cooperative, appears stated age and no distress Resp: clear to auscultation bilaterally Cardio: regular rate and rhythm, S1, S2 normal, no murmur, click, rub or gallop GI: soft, non-tender; bowel sounds normal; no masses,  no organomegaly   DISPOSITION: Home  Discharge Instructions    Call MD for:  extreme fatigue   Complete by:  As directed    Call MD for:  persistant dizziness or light-headedness   Complete by:  As directed    Call MD for:  persistant nausea and vomiting   Complete by:  As directed    Call MD for:  severe uncontrolled pain   Complete by:  As directed    Call MD for:  temperature >100.4   Complete by:  As directed    Diet Carb Modified   Complete by:  As directed    Discharge instructions   Complete by:  As directed    Please resume home dialysis as instructed by the nephrologist.  You were cared for by a hospitalist during your hospital stay. If you have any questions about  your discharge medications or the care you received while you were in the hospital after you are discharged, you can call the unit and asked to speak with the hospitalist on call if the hospitalist that took care of you is not available. Once you are discharged, your primary care physician will handle any further medical issues. Please note that NO REFILLS for any discharge medications will be authorized once you are discharged, as it is imperative that you return to your primary care physician (or establish a relationship with a primary care physician if you do not have one) for your aftercare needs so that they can reassess your need for medications and monitor your lab values. If you do not have a primary care physician, you can call [phone] for a physician referral.   Increase activity slowly   Complete by:  As directed          Allergies as of 08/08/2017      Reactions   Atorvastatin Other (See Comments)  Muscle pain   Minoxidil Other (See Comments)   Fluid retention   Adhesive [tape] Other (See Comments)   MUST USE "SILK TAPE," AS ANY OTHER "BURNS" THE SKIN   Statins Other (See Comments)   Muscle pain      Medication List    TAKE these medications   acetaminophen 500 MG tablet Commonly known as:  TYLENOL Take 1,000 mg by mouth every 6 (six) hours as needed (for pain).   aspirin 325 MG EC tablet Take 1 tablet (325 mg total) by mouth daily.   calcitRIOL 0.25 MCG capsule Commonly known as:  ROCALTROL Take 1 capsule (0.25 mcg total) by mouth every Monday, Wednesday, and Friday with hemodialysis. What changed:    when to take this  additional instructions   calcium acetate 667 MG capsule Commonly known as:  PHOSLO Take 1 capsule (667 mg total) by mouth 3 (three) times daily. What changed:  when to take this   carvedilol 3.125 MG tablet Commonly known as:  COREG Take 1 tablet (3.125 mg total) by mouth 2 (two) times daily with a meal. What changed:  how much to take     Darbepoetin Alfa 60 MCG/0.3ML Sosy injection Commonly known as:  ARANESP Inject 0.3 mLs (60 mcg total) into the vein every Monday with hemodialysis.   docusate sodium 100 MG capsule Commonly known as:  COLACE Take 1 capsule (100 mg total) by mouth 2 (two) times daily. What changed:    how much to take  when to take this  additional instructions   FREESTYLE LIBRE [place] 1 each by Does not apply route every 14 (fourteen) days.   FREESTYLE LIBRE 14 DAY SENSOR Misc 1 each by Does not apply route every 14 (fourteen) days.   gabapentin 100 MG capsule Commonly known as:  NEURONTIN Take 1 capsule (100 mg total) by mouth daily. What changed:    when to take this  additional instructions   gabapentin 300 MG capsule Commonly known as:  NEURONTIN Take 1 capsule (300 mg total) by mouth every Monday, Wednesday, and Friday with hemodialysis. What changed:    when to take this  additional instructions   HUMALOG KWIKPEN [place] Inject 15-20 Units into the skin 3 (three) times daily before meals.   insulin glargine 100 unit/mL Sopn Commonly known as:  LANTUS Inject 0.25 mLs (25 Units total) into the skin at bedtime.   methocarbamol 500 MG tablet Commonly known as:  ROBAXIN Take 1 tablet (500 mg total) by mouth every 6 (six) hours as needed for muscle spasms.   ondansetron 4 MG disintegrating tablet Commonly known as:  ZOFRAN ODT Take 1 tablet (4 mg total) by mouth every 8 (eight) hours as needed for nausea or vomiting.   oxyCODONE-acetaminophen 5-325 MG tablet Commonly known as:  PERCOCET/ROXICET Take 1 tablet by mouth every 6 (six) hours as needed for severe pain. What changed:    when to take this  reasons to take this   pantoprazole 40 MG tablet Commonly known as:  PROTONIX Take 1 tablet (40 mg total) by mouth daily.   pentoxifylline 400 MG CR tablet Commonly known as:  TRENTAL Take 1 tablet (400 mg total) by mouth 3 (three) times daily with meals.    polyethylene glycol packet Commonly known as:  MIRALAX Take 17 g by mouth daily. What changed:    when to take this  additional instructions   sucroferric oxyhydroxide 500 MG chewable tablet Commonly known as:  Jaye Beagle  3 tablets (1,500 mg total) by mouth 3 (three) times daily.          TOTAL DISCHARGE TIME: 35 minutes  Bonnielee Haff  [facility] Pager [phone]  08/08/2017, 2:48 PM

## 2017-08-08 NOTE — Progress Notes (Signed)
[facility] Progress Note  Subjective: breathing better, he has some valid questions about the HD rules/ process for Nxt stage home dialysis, we discussed these at length.  Has lost body wt, pt is aware of the process for lowering/ challenging dry wt and can do this at home.    Vitals:   08/07/17 1830 08/07/17 2013 08/07/17 2328 08/08/17 0427  BP: (!) 144/89 117/68 (!) 157/88 (!) 142/88  Pulse: 93 99 (!) 102 96  Resp: 18 17 16    Temp:  98.6 F (37 C) 98.5 F (36.9 C) 98.1 F (36.7 C)  TempSrc:  Oral Oral Oral  SpO2: 100% 94% 95% 97%  Weight:      Height:        Inpatient medications: . aspirin  325 mg Oral Daily  . calcitRIOL  0.25 mcg Oral Once per day on Mon Tue Thu Fri  . calcium acetate  667 mg Oral TID WC  . carvedilol  6.25 mg Oral BID WC  . Chlorhexidine Gluconate Cloth  6 each Topical Q0600  . gabapentin  300 mg Oral Once per day on Mon Tue Thu Fri  . heparin  5,000 Units Subcutaneous Q8H  . insulin aspart  0-5 Units Subcutaneous QHS  . insulin aspart  0-9 Units Subcutaneous TID WC  . insulin glargine  25 Units Subcutaneous QHS  . pentoxifylline  400 mg Oral TID WC  . sucroferric oxyhydroxide  1,500 mg Oral TID WC    acetaminophen, albuterol, docusate sodium, ondansetron **OR** ondansetron (ZOFRAN) IV, oxyCODONE, polyethylene glycol, traZODone  Exam: General: Well developed, well nourished, in no acute distress. Head: Normocephalic, atraumatic, sclera non-icteric, mucus membranes are moist. Neck: Supple without lymphadenopathy/masses. JVD not elevated. Lungs: Clear bilaterally to auscultation without wheezes, rales, or rhonchi. Breathing is unlabored. Heart: RRR with normal S1, S2. No murmurs, rubs, or gallops appreciated. Abdomen: Soft, non-tender, non-distended with normoactive bowel sounds.  Musculoskeletal:  Strength and tone appear normal for age. Lower extremities: No LLE, R stump with sutures/staples in place, no expressible pus. Neuro:  Alert and oriented X 3. Moves all extremities spontaneously. Psych:  Responds to questions appropriately with a normal affect. Dialysis Access: RUE AVF + thrill    Dialysis: [facility] on NxStage, recently started. Dr. Posey Pronto is his nephrologist. 3.5hr, EDW 103kg, AVF, + heparin.  Assessment: 1.  Dyspnea/pulmonary edema: Resolved clinically. 4L off w/ HD yest w/ no BP drop, this means still has excess fluid to get off. OK for dc, pt will continue to drop his dry wt in incremental fashion at home.  He got down to 101.1 here post HD yest.  2.   ESRD: NxtStage home HD.  3.  Anemia: Hgb > 11. No ESA for now. 4.  Metabolic bone disease: Ca ok, continue home binders. 5.  Uncontrolled T1DM: Per primary 6.  S/p recent R BKA  Plan - OK for dc today, pt knows to continue to push the dry weight lower at home prob prob around 98kg over the days and weeks to come.    [name] MD [facility] pager [phone]   08/08/2017, 12:18 PM   Recent Labs  Lab 08/07/17 0304 08/08/17 0631  NA 134* 134*  K 4.8 5.0  CL 92* 96*  CO2 27 28  GLUCOSE 318* 347*  BUN 51* 37*  CREATININE 9.97* 7.92*  CALCIUM 9.0 8.3*  ALBUMIN 3.1* 2.6*   Recent Labs  Lab 08/07/17 0304 08/08/17 0631  AST 13* 12*  ALT 8  8  ALKPHOS 100 92  BILITOT 0.9 0.6  PROT 7.4 6.5   Recent Labs  Lab 08/07/17 0304  WBC 9.3  NEUTROABS 6.8  HGB 11.0*  HCT 35.6*  MCV 97.0  PLT 252   Iron/TIBC/Ferritin/ %Sat    Component Value Date/Time   IRON 22 (L) 12/04/2015 1230   TIBC 139 (L) 12/04/2015 1230   IRONPCTSAT 16 (L) 12/04/2015 1230

## 2017-08-10 DIAGNOSIS — E1022 Type 1 diabetes mellitus with diabetic chronic kidney disease: Secondary | ICD-10-CM | POA: Diagnosis not present

## 2017-08-10 DIAGNOSIS — N186 End stage renal disease: Secondary | ICD-10-CM | POA: Diagnosis not present

## 2017-08-10 DIAGNOSIS — Z992 Dependence on renal dialysis: Secondary | ICD-10-CM | POA: Diagnosis not present

## 2017-08-11 DIAGNOSIS — D631 Anemia in chronic kidney disease: Secondary | ICD-10-CM | POA: Diagnosis not present

## 2017-08-11 DIAGNOSIS — Z4931 Encounter for adequacy testing for hemodialysis: Secondary | ICD-10-CM | POA: Diagnosis not present

## 2017-08-11 DIAGNOSIS — K7689 Other specified diseases of liver: Secondary | ICD-10-CM | POA: Diagnosis not present

## 2017-08-11 DIAGNOSIS — N186 End stage renal disease: Secondary | ICD-10-CM | POA: Diagnosis not present

## 2017-08-11 DIAGNOSIS — D509 Iron deficiency anemia, unspecified: Secondary | ICD-10-CM | POA: Diagnosis not present

## 2017-08-11 DIAGNOSIS — E878 Other disorders of electrolyte and fluid balance, not elsewhere classified: Secondary | ICD-10-CM | POA: Diagnosis not present

## 2017-08-11 DIAGNOSIS — Z992 Dependence on renal dialysis: Secondary | ICD-10-CM | POA: Diagnosis not present

## 2017-08-11 DIAGNOSIS — E7849 Other hyperlipidemia: Secondary | ICD-10-CM | POA: Diagnosis not present

## 2017-08-11 DIAGNOSIS — E1129 Type 2 diabetes mellitus with other diabetic kidney complication: Secondary | ICD-10-CM | POA: Diagnosis not present

## 2017-08-11 DIAGNOSIS — R509 Fever, unspecified: Secondary | ICD-10-CM | POA: Diagnosis not present

## 2017-08-11 DIAGNOSIS — E1022 Type 1 diabetes mellitus with diabetic chronic kidney disease: Secondary | ICD-10-CM | POA: Diagnosis not present

## 2017-08-11 DIAGNOSIS — N2581 Secondary hyperparathyroidism of renal origin: Secondary | ICD-10-CM | POA: Diagnosis not present

## 2017-08-12 DIAGNOSIS — E8779 Other fluid overload: Secondary | ICD-10-CM | POA: Diagnosis not present

## 2017-08-12 DIAGNOSIS — Z4931 Encounter for adequacy testing for hemodialysis: Secondary | ICD-10-CM | POA: Diagnosis not present

## 2017-08-12 DIAGNOSIS — N2581 Secondary hyperparathyroidism of renal origin: Secondary | ICD-10-CM | POA: Diagnosis not present

## 2017-08-12 DIAGNOSIS — E878 Other disorders of electrolyte and fluid balance, not elsewhere classified: Secondary | ICD-10-CM | POA: Diagnosis not present

## 2017-08-12 DIAGNOSIS — Z992 Dependence on renal dialysis: Secondary | ICD-10-CM | POA: Diagnosis not present

## 2017-08-12 DIAGNOSIS — E1022 Type 1 diabetes mellitus with diabetic chronic kidney disease: Secondary | ICD-10-CM | POA: Diagnosis not present

## 2017-08-12 DIAGNOSIS — N186 End stage renal disease: Secondary | ICD-10-CM | POA: Diagnosis not present

## 2017-08-12 DIAGNOSIS — D509 Iron deficiency anemia, unspecified: Secondary | ICD-10-CM | POA: Diagnosis not present

## 2017-08-12 DIAGNOSIS — D631 Anemia in chronic kidney disease: Secondary | ICD-10-CM | POA: Diagnosis not present

## 2017-08-12 DIAGNOSIS — I120 Hypertensive chronic kidney disease with stage 5 chronic kidney disease or end stage renal disease: Secondary | ICD-10-CM | POA: Diagnosis not present

## 2017-08-13 DIAGNOSIS — I5032 Chronic diastolic (congestive) heart failure: Secondary | ICD-10-CM | POA: Diagnosis not present

## 2017-08-13 DIAGNOSIS — E1051 Type 1 diabetes mellitus with diabetic peripheral angiopathy without gangrene: Secondary | ICD-10-CM | POA: Diagnosis not present

## 2017-08-13 DIAGNOSIS — Z4781 Encounter for orthopedic aftercare following surgical amputation: Secondary | ICD-10-CM | POA: Diagnosis not present

## 2017-08-13 DIAGNOSIS — E1022 Type 1 diabetes mellitus with diabetic chronic kidney disease: Secondary | ICD-10-CM | POA: Diagnosis not present

## 2017-08-13 DIAGNOSIS — E1042 Type 1 diabetes mellitus with diabetic polyneuropathy: Secondary | ICD-10-CM | POA: Diagnosis not present

## 2017-08-13 DIAGNOSIS — I471 Supraventricular tachycardia: Secondary | ICD-10-CM | POA: Diagnosis not present

## 2017-08-13 DIAGNOSIS — M19011 Primary osteoarthritis, right shoulder: Secondary | ICD-10-CM | POA: Diagnosis not present

## 2017-08-13 DIAGNOSIS — F17200 Nicotine dependence, unspecified, uncomplicated: Secondary | ICD-10-CM | POA: Diagnosis not present

## 2017-08-13 DIAGNOSIS — M1711 Unilateral primary osteoarthritis, right knee: Secondary | ICD-10-CM | POA: Diagnosis not present

## 2017-08-13 DIAGNOSIS — Z992 Dependence on renal dialysis: Secondary | ICD-10-CM | POA: Diagnosis not present

## 2017-08-13 DIAGNOSIS — N186 End stage renal disease: Secondary | ICD-10-CM | POA: Diagnosis not present

## 2017-08-13 DIAGNOSIS — I132 Hypertensive heart and chronic kidney disease with heart failure and with stage 5 chronic kidney disease, or end stage renal disease: Secondary | ICD-10-CM | POA: Diagnosis not present

## 2017-08-13 DIAGNOSIS — D631 Anemia in chronic kidney disease: Secondary | ICD-10-CM | POA: Diagnosis not present

## 2017-08-14 DIAGNOSIS — D509 Iron deficiency anemia, unspecified: Secondary | ICD-10-CM | POA: Diagnosis not present

## 2017-08-14 DIAGNOSIS — Z992 Dependence on renal dialysis: Secondary | ICD-10-CM | POA: Diagnosis not present

## 2017-08-14 DIAGNOSIS — D631 Anemia in chronic kidney disease: Secondary | ICD-10-CM | POA: Diagnosis not present

## 2017-08-14 DIAGNOSIS — Z4931 Encounter for adequacy testing for hemodialysis: Secondary | ICD-10-CM | POA: Diagnosis not present

## 2017-08-14 DIAGNOSIS — E878 Other disorders of electrolyte and fluid balance, not elsewhere classified: Secondary | ICD-10-CM | POA: Diagnosis not present

## 2017-08-14 DIAGNOSIS — N186 End stage renal disease: Secondary | ICD-10-CM | POA: Diagnosis not present

## 2017-08-15 DIAGNOSIS — Z4931 Encounter for adequacy testing for hemodialysis: Secondary | ICD-10-CM | POA: Diagnosis not present

## 2017-08-15 DIAGNOSIS — D631 Anemia in chronic kidney disease: Secondary | ICD-10-CM | POA: Diagnosis not present

## 2017-08-15 DIAGNOSIS — D509 Iron deficiency anemia, unspecified: Secondary | ICD-10-CM | POA: Diagnosis not present

## 2017-08-15 DIAGNOSIS — N186 End stage renal disease: Secondary | ICD-10-CM | POA: Diagnosis not present

## 2017-08-15 DIAGNOSIS — E878 Other disorders of electrolyte and fluid balance, not elsewhere classified: Secondary | ICD-10-CM | POA: Diagnosis not present

## 2017-08-15 DIAGNOSIS — Z992 Dependence on renal dialysis: Secondary | ICD-10-CM | POA: Diagnosis not present

## 2017-08-18 ENCOUNTER — Ambulatory Visit (INDEPENDENT_AMBULATORY_CARE_PROVIDER_SITE_OTHER): Payer: BLUE CROSS/BLUE SHIELD | Admitting: Orthopedic Surgery

## 2017-08-18 ENCOUNTER — Encounter (INDEPENDENT_AMBULATORY_CARE_PROVIDER_SITE_OTHER): Payer: Self-pay | Admitting: Orthopedic Surgery

## 2017-08-18 VITALS — Ht 75.0 in | Wt 232.0 lb

## 2017-08-18 DIAGNOSIS — Z4931 Encounter for adequacy testing for hemodialysis: Secondary | ICD-10-CM | POA: Diagnosis not present

## 2017-08-18 DIAGNOSIS — N186 End stage renal disease: Secondary | ICD-10-CM | POA: Diagnosis not present

## 2017-08-18 DIAGNOSIS — D631 Anemia in chronic kidney disease: Secondary | ICD-10-CM | POA: Diagnosis not present

## 2017-08-18 DIAGNOSIS — Z992 Dependence on renal dialysis: Secondary | ICD-10-CM | POA: Diagnosis not present

## 2017-08-18 DIAGNOSIS — S88111A Complete traumatic amputation at level between knee and ankle, right lower leg, initial encounter: Secondary | ICD-10-CM

## 2017-08-18 DIAGNOSIS — D509 Iron deficiency anemia, unspecified: Secondary | ICD-10-CM | POA: Diagnosis not present

## 2017-08-18 DIAGNOSIS — E878 Other disorders of electrolyte and fluid balance, not elsewhere classified: Secondary | ICD-10-CM | POA: Diagnosis not present

## 2017-08-18 DIAGNOSIS — Z89511 Acquired absence of right leg below knee: Secondary | ICD-10-CM

## 2017-08-18 NOTE — Progress Notes (Signed)
Office Visit Note   Patient: Lance [name]           Date of Birth: 1970/02/20           MRN: [id number] Visit Date: 08/18/2017              Requested by: Lance [name], Lance [name] [place], [place] [address] PCP: Lance Kirschner, MD  Chief Complaint  Patient presents with  . Right Knee - Routine Post Op, Follow-up    07/25/17 Revision right Below Knee Amputation, Application of Wound Vac      HPI: Patient is a 47 year old gentleman presents follow-up status post revision transtibial amputation of the right.  He is about 4 weeks out from surgery.  Assessment & Plan: Visit Diagnoses:  1. Amputation of right lower extremity below knee [facility])     Plan: We will harvest the sutures and staples today continue with compression continue with daily dressing changes.  Patient will have home health nursing continue with dressing changes 3 times a week.  He is given a prescription for [employer] for prosthetic fitting.  Patient states that the does not want a follow-up with [facility] and is interested in following up with [facility].  Follow-Up Instructions: Return in about 3 weeks (around 09/08/2017).   Ortho Exam  Patient is alert, oriented, no adenopathy, well-dressed, normal affect, normal respiratory effort. Examination patient still has some clear drainage there is no cellulitis there is no wound dehiscence.  Imaging: No results found. No images are attached to the encounter.  Labs: Lab Results  Component Value Date   HGBA1C 6.7 (H) 07/25/2017   HGBA1C 9.1 (H) 02/18/2017   HGBA1C 9.8 (H) 11/28/2015   ESRSEDRATE 120 (H) 11/13/2015   CRP 35.4 (H) 11/13/2015   REPTSTATUS 12/06/2015 FINAL 11/30/2015   REPTSTATUS 11/30/2015 FINAL 11/30/2015   GRAMSTAIN  11/30/2015    WBC PRESENT,BOTH PMN AND MONONUCLEAR NO ORGANISMS SEEN CYTOSPIN SMEAR    CULT NO GROWTH 5 DAYS 11/30/2015   LABORGA STREPTOCOCCUS PNEUMONIAE 11/12/2015     Lab Results  Component Value  Date   ALBUMIN 2.6 (L) 08/08/2017   ALBUMIN 3.1 (L) 08/07/2017   ALBUMIN 2.0 (L) 06/18/2017    Body mass index is 29 kg/m.  Orders:  No orders of the defined types were placed in this encounter.  No orders of the defined types were placed in this encounter.    Procedures: No procedures performed  Clinical Data: No additional findings.  ROS:  All other systems negative, except as noted in the HPI. Review of Systems  Objective: Vital Signs: Ht 6\' 3"  (1.905 m)   Wt 232 lb (105.2 kg)   BMI 29.00 kg/m   Specialty Comments:  No specialty comments available.  PMFS History: Patient Active Problem List   Diagnosis Date Noted  . Acute respiratory failure with hypoxia ([place]) 08/07/2017  . Respiratory distress 08/07/2017  . Dehiscence of amputation stump ([place]) 07/17/2017  . Benign essential HTN   . Chronic diastolic (congestive) heart failure ([place])   . Labile blood glucose   . Labile blood pressure   . Vomiting   . Phantom limb pain ([place])   . Poorly controlled type 2 diabetes mellitus with peripheral neuropathy ([place])   . Unilateral complete BKA, right, sequela ([place])   . Amputation of right lower extremity below knee ([place]) 06/10/2017  . ESRD on dialysis ([place])   . Chronic diastolic congestive heart failure ([place])   . History of  PSVT (paroxysmal supraventricular tachycardia)   . Diabetes mellitus type 2 in nonobese (HCC)   . Post-operative pain   . Acute blood loss anemia   . Anemia of chronic disease   . Leukocytosis   . Tachycardia   . History of right below knee amputation ([place]) 06/06/2017  . PVD (peripheral vascular disease) ([place]) 05/13/2017  . Bilateral pleural effusion 11/28/2015  . Loculated pleural effusion 11/28/2015  . HCAP (healthcare-associated pneumonia) 11/28/2015  . ESRD (end stage renal disease) on dialysis ([place]) 11/12/2015  . Hyperkalemia 11/12/2015  . Shoulder pain, acute 11/12/2015  . Nausea & vomiting 11/12/2015  . Diarrhea 11/12/2015  .  Hypertensive urgency 05/05/2014  . Nephrotic syndrome 12/29/2013  . Hyponatremia 12/28/2013  . Acute diastolic heart failure ([place]) 12/28/2013  . Hypoglycemia 12/28/2013  . Dyspnea   . Acute renal failure syndrome ([place])   . Diabetic ketoacidosis without coma associated with type 1 diabetes mellitus ([place])   . Demand ischemia ([place]) 12/24/2013  . DKA (diabetic ketoacidoses) ([place]) 12/23/2013  . CAP (community acquired pneumonia) 12/23/2013  . Sepsis due to pneumonia ([place]) 12/23/2013  . Metabolic acidemia 19/14/7829  . Acute renal failure ([place]) 12/23/2013  . Diabetic ketoacidosis ([place]) 12/23/2013  . Chronic diastolic heart failure ([place]) 04/14/2013  . PSVT (paroxysmal supraventricular tachycardia) ([place]) 04/14/2013  . Bilateral leg edema 03/26/2013  . Chronic kidney disease, stage III (moderate) ([place]) 09/29/2011  . Type 1 diabetes mellitus with end-stage renal disease ([place]) 09/26/2010  . Mixed hyperlipidemia 09/26/2010   Past Medical History:  Diagnosis Date  . Aortic stenosis    Very mild in 05/2007  . Arthritis    "right shoulder; right knee; feel like I've got it all over" (11/28/2015)  . CHF (congestive heart failure) ([place])   . Degenerative joint disease    Left TKA  . Diastolic heart failure (HCC)    LVEF 65-70% with grade 2 diastolic dysfunction  . ESRD (end stage renal disease) on dialysis ([place])    "MWF; [facility] on [address]" (11/28/2015)  . Essential hypertension, benign 2000   LVH  . GERD (gastroesophageal reflux disease)   . HCAP (healthcare-associated pneumonia) 11/28/2015  . Hyperlipidemia 2000  . Loculated pleural effusion 11/28/2015   [name] 11/28/2015  . Pneumonia 12/2013; 05/2014  . PSVT (paroxysmal supraventricular tachycardia) (HCC)    Nonsustained; asymptomatic; diagnosed by event recorder in 2006  . Tobacco abuse, in remission    20 pack years; discontinued in 1985; bronchitic changes on chest x-ray and 2009  . Type 1 diabetes mellitus ([place]) 1990    Family  History  Problem Relation Age of Onset  . Diabetes Father   . Hypertension Father   . Diabetes Mother   . Hypertension Mother     Past Surgical History:  Procedure Laterality Date  . AMPUTATION Right 06/06/2017   Procedure: RIGHT BELOW KNEE AMPUTATION;  Surgeon: [name], MD;  Location: [facility];  Service: Orthopedics;  Laterality: Right;  . APPLICATION OF WOUND VAC Right 07/25/2017   Procedure: APPLICATION OF WOUND VAC;  Surgeon: [name], MD;  Location: [facility];  Service: Orthopedics;  Laterality: Right;  . AV FISTULA PLACEMENT Right 06/23/2014   Procedure: ARTERIOVENOUS (AV) FISTULA CREATION;  Surgeon: Angelia Mould, MD;  Location: [facility];  Service: Vascular;  Laterality: Right;  . CARDIAC CATHETERIZATION     [facility]- evaluation for Kidney/ pancreas transplant  . EYE SURGERY Bilateral    "for glaucoma"  . INGUINAL HERNIA REPAIR Right    As  a child  . LOWER EXTREMITY ANGIOGRAPHY N/A 05/27/2017   Procedure: LOWER EXTREMITY ANGIOGRAPHY;  Surgeon: [name], MD;  Location: [facility];  Service: Cardiovascular;  Laterality: N/A;  . STUMP REVISION Right 07/25/2017   Procedure: REVISION RIGHT BELOW KNEE AMPUTATION;;  Surgeon: [name], MD;  Location: [facility];  Service: Orthopedics;  Laterality: Right;  . [place] EXTRACTION  1987   Social History   Occupational History  . Not on file  Tobacco Use  . Smoking status: Never Smoker  . Smokeless tobacco: Never Used  Substance and Sexual Activity  . Alcohol use: Yes    Comment: 11/28/2015 "used to drink; stopped ~ 2 yr ago"  . Drug use: No  . Sexual activity: Yes

## 2017-08-19 DIAGNOSIS — D509 Iron deficiency anemia, unspecified: Secondary | ICD-10-CM | POA: Diagnosis not present

## 2017-08-19 DIAGNOSIS — E878 Other disorders of electrolyte and fluid balance, not elsewhere classified: Secondary | ICD-10-CM | POA: Diagnosis not present

## 2017-08-19 DIAGNOSIS — Z4931 Encounter for adequacy testing for hemodialysis: Secondary | ICD-10-CM | POA: Diagnosis not present

## 2017-08-19 DIAGNOSIS — D631 Anemia in chronic kidney disease: Secondary | ICD-10-CM | POA: Diagnosis not present

## 2017-08-19 DIAGNOSIS — N186 End stage renal disease: Secondary | ICD-10-CM | POA: Diagnosis not present

## 2017-08-19 DIAGNOSIS — Z992 Dependence on renal dialysis: Secondary | ICD-10-CM | POA: Diagnosis not present

## 2017-08-20 DIAGNOSIS — D631 Anemia in chronic kidney disease: Secondary | ICD-10-CM | POA: Diagnosis not present

## 2017-08-20 DIAGNOSIS — Z4931 Encounter for adequacy testing for hemodialysis: Secondary | ICD-10-CM | POA: Diagnosis not present

## 2017-08-20 DIAGNOSIS — E878 Other disorders of electrolyte and fluid balance, not elsewhere classified: Secondary | ICD-10-CM | POA: Diagnosis not present

## 2017-08-20 DIAGNOSIS — N186 End stage renal disease: Secondary | ICD-10-CM | POA: Diagnosis not present

## 2017-08-20 DIAGNOSIS — I5033 Acute on chronic diastolic (congestive) heart failure: Secondary | ICD-10-CM | POA: Diagnosis not present

## 2017-08-20 DIAGNOSIS — T8781 Dehiscence of amputation stump: Secondary | ICD-10-CM | POA: Diagnosis not present

## 2017-08-20 DIAGNOSIS — Z992 Dependence on renal dialysis: Secondary | ICD-10-CM | POA: Diagnosis not present

## 2017-08-20 DIAGNOSIS — D509 Iron deficiency anemia, unspecified: Secondary | ICD-10-CM | POA: Diagnosis not present

## 2017-08-20 DIAGNOSIS — I132 Hypertensive heart and chronic kidney disease with heart failure and with stage 5 chronic kidney disease, or end stage renal disease: Secondary | ICD-10-CM | POA: Diagnosis not present

## 2017-08-20 DIAGNOSIS — E1022 Type 1 diabetes mellitus with diabetic chronic kidney disease: Secondary | ICD-10-CM | POA: Diagnosis not present

## 2017-08-21 ENCOUNTER — Encounter
Payer: BLUE CROSS/BLUE SHIELD | Attending: Physical Medicine & Rehabilitation | Admitting: Physical Medicine & Rehabilitation

## 2017-08-21 DIAGNOSIS — Z09 Encounter for follow-up examination after completed treatment for conditions other than malignant neoplasm: Secondary | ICD-10-CM | POA: Insufficient documentation

## 2017-08-21 DIAGNOSIS — Z833 Family history of diabetes mellitus: Secondary | ICD-10-CM | POA: Insufficient documentation

## 2017-08-21 DIAGNOSIS — D509 Iron deficiency anemia, unspecified: Secondary | ICD-10-CM | POA: Diagnosis not present

## 2017-08-21 DIAGNOSIS — I132 Hypertensive heart and chronic kidney disease with heart failure and with stage 5 chronic kidney disease, or end stage renal disease: Secondary | ICD-10-CM | POA: Insufficient documentation

## 2017-08-21 DIAGNOSIS — E878 Other disorders of electrolyte and fluid balance, not elsewhere classified: Secondary | ICD-10-CM | POA: Diagnosis not present

## 2017-08-21 DIAGNOSIS — Z8249 Family history of ischemic heart disease and other diseases of the circulatory system: Secondary | ICD-10-CM | POA: Insufficient documentation

## 2017-08-21 DIAGNOSIS — Z4931 Encounter for adequacy testing for hemodialysis: Secondary | ICD-10-CM | POA: Diagnosis not present

## 2017-08-21 DIAGNOSIS — Z992 Dependence on renal dialysis: Secondary | ICD-10-CM | POA: Diagnosis not present

## 2017-08-21 DIAGNOSIS — D631 Anemia in chronic kidney disease: Secondary | ICD-10-CM | POA: Diagnosis not present

## 2017-08-21 DIAGNOSIS — Z89511 Acquired absence of right leg below knee: Secondary | ICD-10-CM | POA: Insufficient documentation

## 2017-08-21 DIAGNOSIS — K5900 Constipation, unspecified: Secondary | ICD-10-CM | POA: Insufficient documentation

## 2017-08-21 DIAGNOSIS — Z9889 Other specified postprocedural states: Secondary | ICD-10-CM | POA: Insufficient documentation

## 2017-08-21 DIAGNOSIS — N186 End stage renal disease: Secondary | ICD-10-CM | POA: Diagnosis not present

## 2017-08-21 DIAGNOSIS — R269 Unspecified abnormalities of gait and mobility: Secondary | ICD-10-CM | POA: Insufficient documentation

## 2017-08-21 DIAGNOSIS — G546 Phantom limb syndrome with pain: Secondary | ICD-10-CM | POA: Insufficient documentation

## 2017-08-21 DIAGNOSIS — E1122 Type 2 diabetes mellitus with diabetic chronic kidney disease: Secondary | ICD-10-CM | POA: Insufficient documentation

## 2017-08-22 DIAGNOSIS — I5033 Acute on chronic diastolic (congestive) heart failure: Secondary | ICD-10-CM | POA: Diagnosis not present

## 2017-08-22 DIAGNOSIS — Z4931 Encounter for adequacy testing for hemodialysis: Secondary | ICD-10-CM | POA: Diagnosis not present

## 2017-08-22 DIAGNOSIS — E878 Other disorders of electrolyte and fluid balance, not elsewhere classified: Secondary | ICD-10-CM | POA: Diagnosis not present

## 2017-08-22 DIAGNOSIS — I132 Hypertensive heart and chronic kidney disease with heart failure and with stage 5 chronic kidney disease, or end stage renal disease: Secondary | ICD-10-CM | POA: Diagnosis not present

## 2017-08-22 DIAGNOSIS — D631 Anemia in chronic kidney disease: Secondary | ICD-10-CM | POA: Diagnosis not present

## 2017-08-22 DIAGNOSIS — T8781 Dehiscence of amputation stump: Secondary | ICD-10-CM | POA: Diagnosis not present

## 2017-08-22 DIAGNOSIS — D509 Iron deficiency anemia, unspecified: Secondary | ICD-10-CM | POA: Diagnosis not present

## 2017-08-22 DIAGNOSIS — Z992 Dependence on renal dialysis: Secondary | ICD-10-CM | POA: Diagnosis not present

## 2017-08-22 DIAGNOSIS — E1022 Type 1 diabetes mellitus with diabetic chronic kidney disease: Secondary | ICD-10-CM | POA: Diagnosis not present

## 2017-08-22 DIAGNOSIS — N186 End stage renal disease: Secondary | ICD-10-CM | POA: Diagnosis not present

## 2017-08-23 DIAGNOSIS — S88111A Complete traumatic amputation at level between knee and ankle, right lower leg, initial encounter: Secondary | ICD-10-CM | POA: Diagnosis not present

## 2017-08-25 DIAGNOSIS — Z4931 Encounter for adequacy testing for hemodialysis: Secondary | ICD-10-CM | POA: Diagnosis not present

## 2017-08-25 DIAGNOSIS — Z992 Dependence on renal dialysis: Secondary | ICD-10-CM | POA: Diagnosis not present

## 2017-08-25 DIAGNOSIS — I5033 Acute on chronic diastolic (congestive) heart failure: Secondary | ICD-10-CM | POA: Diagnosis not present

## 2017-08-25 DIAGNOSIS — D631 Anemia in chronic kidney disease: Secondary | ICD-10-CM | POA: Diagnosis not present

## 2017-08-25 DIAGNOSIS — D509 Iron deficiency anemia, unspecified: Secondary | ICD-10-CM | POA: Diagnosis not present

## 2017-08-25 DIAGNOSIS — E1022 Type 1 diabetes mellitus with diabetic chronic kidney disease: Secondary | ICD-10-CM | POA: Diagnosis not present

## 2017-08-25 DIAGNOSIS — I132 Hypertensive heart and chronic kidney disease with heart failure and with stage 5 chronic kidney disease, or end stage renal disease: Secondary | ICD-10-CM | POA: Diagnosis not present

## 2017-08-25 DIAGNOSIS — N186 End stage renal disease: Secondary | ICD-10-CM | POA: Diagnosis not present

## 2017-08-25 DIAGNOSIS — T8781 Dehiscence of amputation stump: Secondary | ICD-10-CM | POA: Diagnosis not present

## 2017-08-25 DIAGNOSIS — E878 Other disorders of electrolyte and fluid balance, not elsewhere classified: Secondary | ICD-10-CM | POA: Diagnosis not present

## 2017-08-26 DIAGNOSIS — D509 Iron deficiency anemia, unspecified: Secondary | ICD-10-CM | POA: Diagnosis not present

## 2017-08-26 DIAGNOSIS — D631 Anemia in chronic kidney disease: Secondary | ICD-10-CM | POA: Diagnosis not present

## 2017-08-26 DIAGNOSIS — Z4931 Encounter for adequacy testing for hemodialysis: Secondary | ICD-10-CM | POA: Diagnosis not present

## 2017-08-26 DIAGNOSIS — E878 Other disorders of electrolyte and fluid balance, not elsewhere classified: Secondary | ICD-10-CM | POA: Diagnosis not present

## 2017-08-26 DIAGNOSIS — N186 End stage renal disease: Secondary | ICD-10-CM | POA: Diagnosis not present

## 2017-08-26 DIAGNOSIS — Z992 Dependence on renal dialysis: Secondary | ICD-10-CM | POA: Diagnosis not present

## 2017-08-27 DIAGNOSIS — I132 Hypertensive heart and chronic kidney disease with heart failure and with stage 5 chronic kidney disease, or end stage renal disease: Secondary | ICD-10-CM | POA: Diagnosis not present

## 2017-08-27 DIAGNOSIS — T8781 Dehiscence of amputation stump: Secondary | ICD-10-CM | POA: Diagnosis not present

## 2017-08-27 DIAGNOSIS — E1022 Type 1 diabetes mellitus with diabetic chronic kidney disease: Secondary | ICD-10-CM | POA: Diagnosis not present

## 2017-08-27 DIAGNOSIS — D631 Anemia in chronic kidney disease: Secondary | ICD-10-CM | POA: Diagnosis not present

## 2017-08-27 DIAGNOSIS — N186 End stage renal disease: Secondary | ICD-10-CM | POA: Diagnosis not present

## 2017-08-27 DIAGNOSIS — I5033 Acute on chronic diastolic (congestive) heart failure: Secondary | ICD-10-CM | POA: Diagnosis not present

## 2017-08-28 DIAGNOSIS — N186 End stage renal disease: Secondary | ICD-10-CM | POA: Diagnosis not present

## 2017-08-28 DIAGNOSIS — Z992 Dependence on renal dialysis: Secondary | ICD-10-CM | POA: Diagnosis not present

## 2017-08-28 DIAGNOSIS — Z4931 Encounter for adequacy testing for hemodialysis: Secondary | ICD-10-CM | POA: Diagnosis not present

## 2017-08-28 DIAGNOSIS — E878 Other disorders of electrolyte and fluid balance, not elsewhere classified: Secondary | ICD-10-CM | POA: Diagnosis not present

## 2017-08-28 DIAGNOSIS — D509 Iron deficiency anemia, unspecified: Secondary | ICD-10-CM | POA: Diagnosis not present

## 2017-08-28 DIAGNOSIS — D631 Anemia in chronic kidney disease: Secondary | ICD-10-CM | POA: Diagnosis not present

## 2017-08-29 DIAGNOSIS — I132 Hypertensive heart and chronic kidney disease with heart failure and with stage 5 chronic kidney disease, or end stage renal disease: Secondary | ICD-10-CM | POA: Diagnosis not present

## 2017-08-29 DIAGNOSIS — D509 Iron deficiency anemia, unspecified: Secondary | ICD-10-CM | POA: Diagnosis not present

## 2017-08-29 DIAGNOSIS — E878 Other disorders of electrolyte and fluid balance, not elsewhere classified: Secondary | ICD-10-CM | POA: Diagnosis not present

## 2017-08-29 DIAGNOSIS — D631 Anemia in chronic kidney disease: Secondary | ICD-10-CM | POA: Diagnosis not present

## 2017-08-29 DIAGNOSIS — N186 End stage renal disease: Secondary | ICD-10-CM | POA: Diagnosis not present

## 2017-08-29 DIAGNOSIS — I5033 Acute on chronic diastolic (congestive) heart failure: Secondary | ICD-10-CM | POA: Diagnosis not present

## 2017-08-29 DIAGNOSIS — E1022 Type 1 diabetes mellitus with diabetic chronic kidney disease: Secondary | ICD-10-CM | POA: Diagnosis not present

## 2017-08-29 DIAGNOSIS — Z4931 Encounter for adequacy testing for hemodialysis: Secondary | ICD-10-CM | POA: Diagnosis not present

## 2017-08-29 DIAGNOSIS — T8781 Dehiscence of amputation stump: Secondary | ICD-10-CM | POA: Diagnosis not present

## 2017-08-29 DIAGNOSIS — Z992 Dependence on renal dialysis: Secondary | ICD-10-CM | POA: Diagnosis not present

## 2017-09-01 DIAGNOSIS — E1022 Type 1 diabetes mellitus with diabetic chronic kidney disease: Secondary | ICD-10-CM | POA: Diagnosis not present

## 2017-09-01 DIAGNOSIS — E878 Other disorders of electrolyte and fluid balance, not elsewhere classified: Secondary | ICD-10-CM | POA: Diagnosis not present

## 2017-09-01 DIAGNOSIS — I132 Hypertensive heart and chronic kidney disease with heart failure and with stage 5 chronic kidney disease, or end stage renal disease: Secondary | ICD-10-CM | POA: Diagnosis not present

## 2017-09-01 DIAGNOSIS — I5033 Acute on chronic diastolic (congestive) heart failure: Secondary | ICD-10-CM | POA: Diagnosis not present

## 2017-09-01 DIAGNOSIS — T8781 Dehiscence of amputation stump: Secondary | ICD-10-CM | POA: Diagnosis not present

## 2017-09-01 DIAGNOSIS — Z992 Dependence on renal dialysis: Secondary | ICD-10-CM | POA: Diagnosis not present

## 2017-09-01 DIAGNOSIS — N186 End stage renal disease: Secondary | ICD-10-CM | POA: Diagnosis not present

## 2017-09-01 DIAGNOSIS — I151 Hypertension secondary to other renal disorders: Secondary | ICD-10-CM | POA: Diagnosis not present

## 2017-09-01 DIAGNOSIS — D631 Anemia in chronic kidney disease: Secondary | ICD-10-CM | POA: Diagnosis not present

## 2017-09-01 DIAGNOSIS — D509 Iron deficiency anemia, unspecified: Secondary | ICD-10-CM | POA: Diagnosis not present

## 2017-09-01 DIAGNOSIS — Z4931 Encounter for adequacy testing for hemodialysis: Secondary | ICD-10-CM | POA: Diagnosis not present

## 2017-09-02 DIAGNOSIS — D509 Iron deficiency anemia, unspecified: Secondary | ICD-10-CM | POA: Diagnosis not present

## 2017-09-02 DIAGNOSIS — D631 Anemia in chronic kidney disease: Secondary | ICD-10-CM | POA: Diagnosis not present

## 2017-09-02 DIAGNOSIS — Z4931 Encounter for adequacy testing for hemodialysis: Secondary | ICD-10-CM | POA: Diagnosis not present

## 2017-09-02 DIAGNOSIS — Z992 Dependence on renal dialysis: Secondary | ICD-10-CM | POA: Diagnosis not present

## 2017-09-02 DIAGNOSIS — N186 End stage renal disease: Secondary | ICD-10-CM | POA: Diagnosis not present

## 2017-09-02 DIAGNOSIS — E878 Other disorders of electrolyte and fluid balance, not elsewhere classified: Secondary | ICD-10-CM | POA: Diagnosis not present

## 2017-09-03 DIAGNOSIS — I132 Hypertensive heart and chronic kidney disease with heart failure and with stage 5 chronic kidney disease, or end stage renal disease: Secondary | ICD-10-CM | POA: Diagnosis not present

## 2017-09-03 DIAGNOSIS — T8781 Dehiscence of amputation stump: Secondary | ICD-10-CM | POA: Diagnosis not present

## 2017-09-03 DIAGNOSIS — D631 Anemia in chronic kidney disease: Secondary | ICD-10-CM | POA: Diagnosis not present

## 2017-09-03 DIAGNOSIS — I5033 Acute on chronic diastolic (congestive) heart failure: Secondary | ICD-10-CM | POA: Diagnosis not present

## 2017-09-03 DIAGNOSIS — E1022 Type 1 diabetes mellitus with diabetic chronic kidney disease: Secondary | ICD-10-CM | POA: Diagnosis not present

## 2017-09-03 DIAGNOSIS — N186 End stage renal disease: Secondary | ICD-10-CM | POA: Diagnosis not present

## 2017-09-04 DIAGNOSIS — N186 End stage renal disease: Secondary | ICD-10-CM | POA: Diagnosis not present

## 2017-09-04 DIAGNOSIS — D631 Anemia in chronic kidney disease: Secondary | ICD-10-CM | POA: Diagnosis not present

## 2017-09-04 DIAGNOSIS — E878 Other disorders of electrolyte and fluid balance, not elsewhere classified: Secondary | ICD-10-CM | POA: Diagnosis not present

## 2017-09-04 DIAGNOSIS — Z4931 Encounter for adequacy testing for hemodialysis: Secondary | ICD-10-CM | POA: Diagnosis not present

## 2017-09-04 DIAGNOSIS — D509 Iron deficiency anemia, unspecified: Secondary | ICD-10-CM | POA: Diagnosis not present

## 2017-09-04 DIAGNOSIS — Z992 Dependence on renal dialysis: Secondary | ICD-10-CM | POA: Diagnosis not present

## 2017-09-05 DIAGNOSIS — I132 Hypertensive heart and chronic kidney disease with heart failure and with stage 5 chronic kidney disease, or end stage renal disease: Secondary | ICD-10-CM | POA: Diagnosis not present

## 2017-09-05 DIAGNOSIS — E878 Other disorders of electrolyte and fluid balance, not elsewhere classified: Secondary | ICD-10-CM | POA: Diagnosis not present

## 2017-09-05 DIAGNOSIS — Z992 Dependence on renal dialysis: Secondary | ICD-10-CM | POA: Diagnosis not present

## 2017-09-05 DIAGNOSIS — N186 End stage renal disease: Secondary | ICD-10-CM | POA: Diagnosis not present

## 2017-09-05 DIAGNOSIS — T8781 Dehiscence of amputation stump: Secondary | ICD-10-CM | POA: Diagnosis not present

## 2017-09-05 DIAGNOSIS — I5033 Acute on chronic diastolic (congestive) heart failure: Secondary | ICD-10-CM | POA: Diagnosis not present

## 2017-09-05 DIAGNOSIS — D509 Iron deficiency anemia, unspecified: Secondary | ICD-10-CM | POA: Diagnosis not present

## 2017-09-05 DIAGNOSIS — Z4931 Encounter for adequacy testing for hemodialysis: Secondary | ICD-10-CM | POA: Diagnosis not present

## 2017-09-05 DIAGNOSIS — E1022 Type 1 diabetes mellitus with diabetic chronic kidney disease: Secondary | ICD-10-CM | POA: Diagnosis not present

## 2017-09-05 DIAGNOSIS — D631 Anemia in chronic kidney disease: Secondary | ICD-10-CM | POA: Diagnosis not present

## 2017-09-07 ENCOUNTER — Other Ambulatory Visit: Payer: Self-pay | Admitting: Family Medicine

## 2017-09-08 ENCOUNTER — Ambulatory Visit (INDEPENDENT_AMBULATORY_CARE_PROVIDER_SITE_OTHER): Payer: BLUE CROSS/BLUE SHIELD | Admitting: Orthopedic Surgery

## 2017-09-08 ENCOUNTER — Encounter (INDEPENDENT_AMBULATORY_CARE_PROVIDER_SITE_OTHER): Payer: Self-pay | Admitting: Orthopedic Surgery

## 2017-09-08 DIAGNOSIS — S88111A Complete traumatic amputation at level between knee and ankle, right lower leg, initial encounter: Secondary | ICD-10-CM

## 2017-09-08 DIAGNOSIS — D631 Anemia in chronic kidney disease: Secondary | ICD-10-CM | POA: Diagnosis not present

## 2017-09-08 DIAGNOSIS — Z4931 Encounter for adequacy testing for hemodialysis: Secondary | ICD-10-CM | POA: Diagnosis not present

## 2017-09-08 DIAGNOSIS — D509 Iron deficiency anemia, unspecified: Secondary | ICD-10-CM | POA: Diagnosis not present

## 2017-09-08 DIAGNOSIS — N186 End stage renal disease: Secondary | ICD-10-CM | POA: Diagnosis not present

## 2017-09-08 DIAGNOSIS — Z89511 Acquired absence of right leg below knee: Secondary | ICD-10-CM

## 2017-09-08 DIAGNOSIS — Z992 Dependence on renal dialysis: Secondary | ICD-10-CM | POA: Diagnosis not present

## 2017-09-08 DIAGNOSIS — E878 Other disorders of electrolyte and fluid balance, not elsewhere classified: Secondary | ICD-10-CM | POA: Diagnosis not present

## 2017-09-08 NOTE — Progress Notes (Signed)
Office Visit Note   Patient: Lance [name]           Date of Birth: 07-29-1970           MRN: [id number] Visit Date: 09/08/2017              Requested by: [name], [address] [place], [place] [address] PCP: [name], MD  Chief Complaint  Patient presents with  . Right Leg - Follow-up      HPI: Patient is a 47 year old gentleman who is 6 weeks status post right transtibial amputation revision.  Patient states there is one staple remaining.  He is wearing his medical compression stocking 3 extra-large  Assessment & Plan: Visit Diagnoses:  1. Amputation of right lower extremity below knee [facility])     Plan: Continue with the compression stocking follow-up for prosthetic fitting with biotech.  Follow-up with Robin for gait training.  Follow-Up Instructions: Return in about 2 months (around 11/09/2017).   Ortho Exam  Patient is alert, oriented, no adenopathy, well-dressed, normal affect, normal respiratory effort. Examination of the residual limb is consolidating nicely there is one staple remaining and this was removed.  There is no redness no cellulitis he has a well consolidated residual limb without signs of action no open wounds  Imaging: No results found. No images are attached to the encounter.  Labs: Lab Results  Component Value Date   HGBA1C 6.7 (H) 07/25/2017   HGBA1C 9.1 (H) 02/18/2017   HGBA1C 9.8 (H) 11/28/2015   ESRSEDRATE 120 (H) 11/13/2015   CRP 35.4 (H) 11/13/2015   REPTSTATUS 12/06/2015 FINAL 11/30/2015   REPTSTATUS 11/30/2015 FINAL 11/30/2015   GRAMSTAIN  11/30/2015    WBC PRESENT,BOTH PMN AND MONONUCLEAR NO ORGANISMS SEEN CYTOSPIN SMEAR    CULT NO GROWTH 5 DAYS 11/30/2015   LABORGA STREPTOCOCCUS PNEUMONIAE 11/12/2015     Lab Results  Component Value Date   ALBUMIN 2.6 (L) 08/08/2017   ALBUMIN 3.1 (L) 08/07/2017   ALBUMIN 2.0 (L) 06/18/2017    There is no height or weight on file to calculate  BMI.  Orders:  No orders of the defined types were placed in this encounter.  No orders of the defined types were placed in this encounter.    Procedures: No procedures performed  Clinical Data: No additional findings.  ROS:  All other systems negative, except as noted in the HPI. Review of Systems  Objective: Vital Signs: There were no vitals taken for this visit.  Specialty Comments:  No specialty comments available.  PMFS History: Patient Active Problem List   Diagnosis Date Noted  . Acute respiratory failure with hypoxia ([place]) 08/07/2017  . Respiratory distress 08/07/2017  . Dehiscence of amputation stump ([place]) 07/17/2017  . Benign essential HTN   . Chronic diastolic (congestive) heart failure ([place])   . Labile blood glucose   . Labile blood pressure   . Vomiting   . Phantom limb pain ([place])   . Poorly controlled type 2 diabetes mellitus with peripheral neuropathy ([place])   . Unilateral complete BKA, right, sequela ([place])   . Amputation of right lower extremity below knee ([place]) 06/10/2017  . ESRD on dialysis ([place])   . Chronic diastolic congestive heart failure ([place])   . History of PSVT (paroxysmal supraventricular tachycardia)   . Diabetes mellitus type 2 in nonobese (HCC)   . Post-operative pain   . Acute blood loss anemia   . Anemia of chronic disease   .  Leukocytosis   . Tachycardia   . History of right below knee amputation ([place]) 06/06/2017  . PVD (peripheral vascular disease) ([place]) 05/13/2017  . Bilateral pleural effusion 11/28/2015  . Loculated pleural effusion 11/28/2015  . HCAP (healthcare-associated pneumonia) 11/28/2015  . ESRD (end stage renal disease) on dialysis ([place]) 11/12/2015  . Hyperkalemia 11/12/2015  . Shoulder pain, acute 11/12/2015  . Nausea & vomiting 11/12/2015  . Diarrhea 11/12/2015  . Hypertensive urgency 05/05/2014  . Nephrotic syndrome 12/29/2013  . Hyponatremia 12/28/2013  . Acute diastolic heart failure ([place]) 12/28/2013  .  Hypoglycemia 12/28/2013  . Dyspnea   . Acute renal failure syndrome ([place])   . Diabetic ketoacidosis without coma associated with type 1 diabetes mellitus ([place])   . Demand ischemia ([place]) 12/24/2013  . DKA (diabetic ketoacidoses) ([place]) 12/23/2013  . CAP (community acquired pneumonia) 12/23/2013  . Sepsis due to pneumonia ([place]) 12/23/2013  . Metabolic acidemia 34/28/7681  . Acute renal failure ([place]) 12/23/2013  . Diabetic ketoacidosis ([place]) 12/23/2013  . Chronic diastolic heart failure ([place]) 04/14/2013  . PSVT (paroxysmal supraventricular tachycardia) ([place]) 04/14/2013  . Bilateral leg edema 03/26/2013  . Chronic kidney disease, stage III (moderate) ([place]) 09/29/2011  . 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 Mixed hyperlipidemia 09/26/2010   Past Medical History:  Diagnosis Date  . Aortic stenosis    Very mild in 05/2007  . Arthritis    "right shoulder; right knee; feel like I've got it all over" (11/28/2015)  . CHF (congestive heart failure) ([place])   . Degenerative joint disease    Left TKA  . Diastolic heart failure (HCC)    LVEF 65-70% with grade 2 diastolic dysfunction  . ESRD (end stage renal disease) on dialysis ([place])    "MWF; [facility] on [address]" (11/28/2015)  . Essential hypertension, benign 2000   LVH  . GERD (gastroesophageal reflux disease)   . HCAP (healthcare-associated pneumonia) 11/28/2015  . Hyperlipidemia 2000  . Loculated pleural effusion 11/28/2015   [name] 11/28/2015  . Pneumonia 12/2013; 05/2014  . PSVT (paroxysmal supraventricular tachycardia) (HCC)    Nonsustained; asymptomatic; diagnosed by event recorder in 2006  . Tobacco abuse, in remission    20 pack years; discontinued in 1985; bronchitic changes on chest x-ray and 2009  . Type 1 diabetes mellitus ([place]) 1990    Family History  Problem Relation Age of Onset  . Diabetes Father   . Hypertension Father   . Diabetes Mother   . Hypertension Mother     Past Surgical  History:  Procedure Laterality Date  . AMPUTATION Right 06/06/2017   Procedure: RIGHT BELOW KNEE AMPUTATION;  Surgeon: [name], MD;  Location: [place];  Service: Orthopedics;  Laterality: Right;  . APPLICATION OF WOUND VAC Right 07/25/2017   Procedure: APPLICATION OF WOUND VAC;  Surgeon: [name], MD;  Location: [place];  Service: Orthopedics;  Laterality: Right;  . AV FISTULA PLACEMENT Right 06/23/2014   Procedure: ARTERIOVENOUS (AV) FISTULA CREATION;  Surgeon: Angelia Mould, MD;  Location: [place];  Service: Vascular;  Laterality: Right;  . CARDIAC CATHETERIZATION     [facility]- evaluation for Kidney/ pancreas transplant  . EYE SURGERY Bilateral    "for glaucoma"  . INGUINAL HERNIA REPAIR Right    As a child  . LOWER EXTREMITY ANGIOGRAPHY N/A 05/27/2017   Procedure: LOWER EXTREMITY ANGIOGRAPHY;  Surgeon: [name], MD;  Location: [place] CV LAB;  Service: Cardiovascular;  Laterality: N/A;  . STUMP  REVISION Right 07/25/2017   Procedure: REVISION RIGHT BELOW KNEE AMPUTATION;;  Surgeon: [name], MD;  Location: [place];  Service: Orthopedics;  Laterality: Right;  . [place] EXTRACTION  1987   Social History   Occupational History  . Not on file  Tobacco Use  . Smoking status: Never Smoker  . Smokeless tobacco: Never Used  Substance and Sexual Activity  . Alcohol use: Yes    Comment: 11/28/2015 "used to drink; stopped ~ 2 yr ago"  . Drug use: No  . Sexual activity: Yes

## 2017-09-09 DIAGNOSIS — D509 Iron deficiency anemia, unspecified: Secondary | ICD-10-CM | POA: Diagnosis not present

## 2017-09-09 DIAGNOSIS — N186 End stage renal disease: Secondary | ICD-10-CM | POA: Diagnosis not present

## 2017-09-09 DIAGNOSIS — D631 Anemia in chronic kidney disease: Secondary | ICD-10-CM | POA: Diagnosis not present

## 2017-09-09 DIAGNOSIS — Z992 Dependence on renal dialysis: Secondary | ICD-10-CM | POA: Diagnosis not present

## 2017-09-09 DIAGNOSIS — Z4931 Encounter for adequacy testing for hemodialysis: Secondary | ICD-10-CM | POA: Diagnosis not present

## 2017-09-09 DIAGNOSIS — E878 Other disorders of electrolyte and fluid balance, not elsewhere classified: Secondary | ICD-10-CM | POA: Diagnosis not present

## 2017-09-10 DIAGNOSIS — I5033 Acute on chronic diastolic (congestive) heart failure: Secondary | ICD-10-CM | POA: Diagnosis not present

## 2017-09-10 DIAGNOSIS — T8781 Dehiscence of amputation stump: Secondary | ICD-10-CM | POA: Diagnosis not present

## 2017-09-10 DIAGNOSIS — I132 Hypertensive heart and chronic kidney disease with heart failure and with stage 5 chronic kidney disease, or end stage renal disease: Secondary | ICD-10-CM | POA: Diagnosis not present

## 2017-09-10 DIAGNOSIS — E1022 Type 1 diabetes mellitus with diabetic chronic kidney disease: Secondary | ICD-10-CM | POA: Diagnosis not present

## 2017-09-10 DIAGNOSIS — N186 End stage renal disease: Secondary | ICD-10-CM | POA: Diagnosis not present

## 2017-09-10 DIAGNOSIS — D631 Anemia in chronic kidney disease: Secondary | ICD-10-CM | POA: Diagnosis not present

## 2017-09-11 DIAGNOSIS — Z4931 Encounter for adequacy testing for hemodialysis: Secondary | ICD-10-CM | POA: Diagnosis not present

## 2017-09-11 DIAGNOSIS — N186 End stage renal disease: Secondary | ICD-10-CM | POA: Diagnosis not present

## 2017-09-11 DIAGNOSIS — R509 Fever, unspecified: Secondary | ICD-10-CM | POA: Diagnosis not present

## 2017-09-11 DIAGNOSIS — N2581 Secondary hyperparathyroidism of renal origin: Secondary | ICD-10-CM | POA: Diagnosis not present

## 2017-09-11 DIAGNOSIS — E878 Other disorders of electrolyte and fluid balance, not elsewhere classified: Secondary | ICD-10-CM | POA: Diagnosis not present

## 2017-09-11 DIAGNOSIS — E440 Moderate protein-calorie malnutrition: Secondary | ICD-10-CM | POA: Diagnosis not present

## 2017-09-11 DIAGNOSIS — D509 Iron deficiency anemia, unspecified: Secondary | ICD-10-CM | POA: Diagnosis not present

## 2017-09-11 DIAGNOSIS — E1022 Type 1 diabetes mellitus with diabetic chronic kidney disease: Secondary | ICD-10-CM | POA: Diagnosis not present

## 2017-09-11 DIAGNOSIS — D631 Anemia in chronic kidney disease: Secondary | ICD-10-CM | POA: Diagnosis not present

## 2017-09-11 DIAGNOSIS — E8779 Other fluid overload: Secondary | ICD-10-CM | POA: Diagnosis not present

## 2017-09-11 DIAGNOSIS — Z79899 Other long term (current) drug therapy: Secondary | ICD-10-CM | POA: Diagnosis not present

## 2017-09-11 DIAGNOSIS — Z992 Dependence on renal dialysis: Secondary | ICD-10-CM | POA: Diagnosis not present

## 2017-09-11 DIAGNOSIS — I5032 Chronic diastolic (congestive) heart failure: Secondary | ICD-10-CM | POA: Diagnosis not present

## 2017-09-11 DIAGNOSIS — K7689 Other specified diseases of liver: Secondary | ICD-10-CM | POA: Diagnosis not present

## 2017-09-11 DIAGNOSIS — E1029 Type 1 diabetes mellitus with other diabetic kidney complication: Secondary | ICD-10-CM | POA: Diagnosis not present

## 2017-09-12 DIAGNOSIS — E1029 Type 1 diabetes mellitus with other diabetic kidney complication: Secondary | ICD-10-CM | POA: Diagnosis not present

## 2017-09-12 DIAGNOSIS — D631 Anemia in chronic kidney disease: Secondary | ICD-10-CM | POA: Diagnosis not present

## 2017-09-12 DIAGNOSIS — K7689 Other specified diseases of liver: Secondary | ICD-10-CM | POA: Diagnosis not present

## 2017-09-12 DIAGNOSIS — N2581 Secondary hyperparathyroidism of renal origin: Secondary | ICD-10-CM | POA: Diagnosis not present

## 2017-09-12 DIAGNOSIS — D509 Iron deficiency anemia, unspecified: Secondary | ICD-10-CM | POA: Diagnosis not present

## 2017-09-12 DIAGNOSIS — N186 End stage renal disease: Secondary | ICD-10-CM | POA: Diagnosis not present

## 2017-09-15 DIAGNOSIS — E1029 Type 1 diabetes mellitus with other diabetic kidney complication: Secondary | ICD-10-CM | POA: Diagnosis not present

## 2017-09-15 DIAGNOSIS — N2581 Secondary hyperparathyroidism of renal origin: Secondary | ICD-10-CM | POA: Diagnosis not present

## 2017-09-15 DIAGNOSIS — D631 Anemia in chronic kidney disease: Secondary | ICD-10-CM | POA: Diagnosis not present

## 2017-09-15 DIAGNOSIS — D509 Iron deficiency anemia, unspecified: Secondary | ICD-10-CM | POA: Diagnosis not present

## 2017-09-15 DIAGNOSIS — N186 End stage renal disease: Secondary | ICD-10-CM | POA: Diagnosis not present

## 2017-09-15 DIAGNOSIS — K7689 Other specified diseases of liver: Secondary | ICD-10-CM | POA: Diagnosis not present

## 2017-09-16 DIAGNOSIS — E1029 Type 1 diabetes mellitus with other diabetic kidney complication: Secondary | ICD-10-CM | POA: Diagnosis not present

## 2017-09-16 DIAGNOSIS — D631 Anemia in chronic kidney disease: Secondary | ICD-10-CM | POA: Diagnosis not present

## 2017-09-16 DIAGNOSIS — N186 End stage renal disease: Secondary | ICD-10-CM | POA: Diagnosis not present

## 2017-09-16 DIAGNOSIS — D509 Iron deficiency anemia, unspecified: Secondary | ICD-10-CM | POA: Diagnosis not present

## 2017-09-16 DIAGNOSIS — N2581 Secondary hyperparathyroidism of renal origin: Secondary | ICD-10-CM | POA: Diagnosis not present

## 2017-09-16 DIAGNOSIS — K7689 Other specified diseases of liver: Secondary | ICD-10-CM | POA: Diagnosis not present

## 2017-09-18 DIAGNOSIS — D509 Iron deficiency anemia, unspecified: Secondary | ICD-10-CM | POA: Diagnosis not present

## 2017-09-18 DIAGNOSIS — I5032 Chronic diastolic (congestive) heart failure: Secondary | ICD-10-CM | POA: Diagnosis not present

## 2017-09-18 DIAGNOSIS — K7689 Other specified diseases of liver: Secondary | ICD-10-CM | POA: Diagnosis not present

## 2017-09-18 DIAGNOSIS — N2581 Secondary hyperparathyroidism of renal origin: Secondary | ICD-10-CM | POA: Diagnosis not present

## 2017-09-18 DIAGNOSIS — E119 Type 2 diabetes mellitus without complications: Secondary | ICD-10-CM | POA: Diagnosis not present

## 2017-09-18 DIAGNOSIS — Z7682 Awaiting organ transplant status: Secondary | ICD-10-CM | POA: Diagnosis not present

## 2017-09-18 DIAGNOSIS — D631 Anemia in chronic kidney disease: Secondary | ICD-10-CM | POA: Diagnosis not present

## 2017-09-18 DIAGNOSIS — E1022 Type 1 diabetes mellitus with diabetic chronic kidney disease: Secondary | ICD-10-CM | POA: Diagnosis not present

## 2017-09-18 DIAGNOSIS — N186 End stage renal disease: Secondary | ICD-10-CM | POA: Diagnosis not present

## 2017-09-18 DIAGNOSIS — I10 Essential (primary) hypertension: Secondary | ICD-10-CM | POA: Diagnosis not present

## 2017-09-18 DIAGNOSIS — Z01818 Encounter for other preprocedural examination: Secondary | ICD-10-CM | POA: Diagnosis not present

## 2017-09-18 DIAGNOSIS — I129 Hypertensive chronic kidney disease with stage 1 through stage 4 chronic kidney disease, or unspecified chronic kidney disease: Secondary | ICD-10-CM | POA: Diagnosis not present

## 2017-09-18 DIAGNOSIS — E1029 Type 1 diabetes mellitus with other diabetic kidney complication: Secondary | ICD-10-CM | POA: Diagnosis not present

## 2017-09-19 DIAGNOSIS — N186 End stage renal disease: Secondary | ICD-10-CM | POA: Diagnosis not present

## 2017-09-19 DIAGNOSIS — D509 Iron deficiency anemia, unspecified: Secondary | ICD-10-CM | POA: Diagnosis not present

## 2017-09-19 DIAGNOSIS — D631 Anemia in chronic kidney disease: Secondary | ICD-10-CM | POA: Diagnosis not present

## 2017-09-19 DIAGNOSIS — N2581 Secondary hyperparathyroidism of renal origin: Secondary | ICD-10-CM | POA: Diagnosis not present

## 2017-09-19 DIAGNOSIS — K7689 Other specified diseases of liver: Secondary | ICD-10-CM | POA: Diagnosis not present

## 2017-09-19 DIAGNOSIS — E1029 Type 1 diabetes mellitus with other diabetic kidney complication: Secondary | ICD-10-CM | POA: Diagnosis not present

## 2017-09-22 DIAGNOSIS — K7689 Other specified diseases of liver: Secondary | ICD-10-CM | POA: Diagnosis not present

## 2017-09-22 DIAGNOSIS — D509 Iron deficiency anemia, unspecified: Secondary | ICD-10-CM | POA: Diagnosis not present

## 2017-09-22 DIAGNOSIS — N186 End stage renal disease: Secondary | ICD-10-CM | POA: Diagnosis not present

## 2017-09-22 DIAGNOSIS — E1029 Type 1 diabetes mellitus with other diabetic kidney complication: Secondary | ICD-10-CM | POA: Diagnosis not present

## 2017-09-22 DIAGNOSIS — D631 Anemia in chronic kidney disease: Secondary | ICD-10-CM | POA: Diagnosis not present

## 2017-09-22 DIAGNOSIS — N2581 Secondary hyperparathyroidism of renal origin: Secondary | ICD-10-CM | POA: Diagnosis not present

## 2017-09-23 DIAGNOSIS — K7689 Other specified diseases of liver: Secondary | ICD-10-CM | POA: Diagnosis not present

## 2017-09-23 DIAGNOSIS — N186 End stage renal disease: Secondary | ICD-10-CM | POA: Diagnosis not present

## 2017-09-23 DIAGNOSIS — D509 Iron deficiency anemia, unspecified: Secondary | ICD-10-CM | POA: Diagnosis not present

## 2017-09-23 DIAGNOSIS — N2581 Secondary hyperparathyroidism of renal origin: Secondary | ICD-10-CM | POA: Diagnosis not present

## 2017-09-23 DIAGNOSIS — S88111A Complete traumatic amputation at level between knee and ankle, right lower leg, initial encounter: Secondary | ICD-10-CM | POA: Diagnosis not present

## 2017-09-23 DIAGNOSIS — D631 Anemia in chronic kidney disease: Secondary | ICD-10-CM | POA: Diagnosis not present

## 2017-09-23 DIAGNOSIS — E1029 Type 1 diabetes mellitus with other diabetic kidney complication: Secondary | ICD-10-CM | POA: Diagnosis not present

## 2017-09-24 ENCOUNTER — Emergency Department (HOSPITAL_COMMUNITY): Payer: Medicare Other

## 2017-09-24 ENCOUNTER — Inpatient Hospital Stay (HOSPITAL_COMMUNITY)
Admission: EM | Admit: 2017-09-24 | Discharge: 2017-09-30 | DRG: 308 | Disposition: A | Discharge status: Home / Self Care | Payer: Medicare Other | Attending: Family Medicine | Admitting: Family Medicine

## 2017-09-24 ENCOUNTER — Inpatient Hospital Stay (HOSPITAL_COMMUNITY): Payer: Medicare Other

## 2017-09-24 ENCOUNTER — Encounter (HOSPITAL_COMMUNITY): Payer: Self-pay | Admitting: Emergency Medicine

## 2017-09-24 ENCOUNTER — Other Ambulatory Visit: Payer: Self-pay

## 2017-09-24 DIAGNOSIS — Z833 Family history of diabetes mellitus: Secondary | ICD-10-CM

## 2017-09-24 DIAGNOSIS — I4901 Ventricular fibrillation: Principal | ICD-10-CM | POA: Diagnosis present

## 2017-09-24 DIAGNOSIS — Z89611 Acquired absence of right leg above knee: Secondary | ICD-10-CM

## 2017-09-24 DIAGNOSIS — J9601 Acute respiratory failure with hypoxia: Secondary | ICD-10-CM | POA: Diagnosis not present

## 2017-09-24 DIAGNOSIS — Z96652 Presence of left artificial knee joint: Secondary | ICD-10-CM | POA: Diagnosis present

## 2017-09-24 DIAGNOSIS — Z8701 Personal history of pneumonia (recurrent): Secondary | ICD-10-CM

## 2017-09-24 DIAGNOSIS — Z4682 Encounter for fitting and adjustment of non-vascular catheter: Secondary | ICD-10-CM | POA: Diagnosis not present

## 2017-09-24 DIAGNOSIS — D696 Thrombocytopenia, unspecified: Secondary | ICD-10-CM | POA: Diagnosis present

## 2017-09-24 DIAGNOSIS — N2581 Secondary hyperparathyroidism of renal origin: Secondary | ICD-10-CM | POA: Diagnosis not present

## 2017-09-24 DIAGNOSIS — E1051 Type 1 diabetes mellitus with diabetic peripheral angiopathy without gangrene: Secondary | ICD-10-CM | POA: Diagnosis present

## 2017-09-24 DIAGNOSIS — N186 End stage renal disease: Secondary | ICD-10-CM

## 2017-09-24 DIAGNOSIS — D631 Anemia in chronic kidney disease: Secondary | ICD-10-CM | POA: Diagnosis present

## 2017-09-24 DIAGNOSIS — I4581 Long QT syndrome: Secondary | ICD-10-CM | POA: Diagnosis not present

## 2017-09-24 DIAGNOSIS — E1022 Type 1 diabetes mellitus with diabetic chronic kidney disease: Secondary | ICD-10-CM | POA: Diagnosis present

## 2017-09-24 DIAGNOSIS — Z452 Encounter for adjustment and management of vascular access device: Secondary | ICD-10-CM

## 2017-09-24 DIAGNOSIS — I083 Combined rheumatic disorders of mitral, aortic and tricuspid valves: Secondary | ICD-10-CM | POA: Diagnosis present

## 2017-09-24 DIAGNOSIS — K219 Gastro-esophageal reflux disease without esophagitis: Secondary | ICD-10-CM | POA: Diagnosis present

## 2017-09-24 DIAGNOSIS — E1010 Type 1 diabetes mellitus with ketoacidosis without coma: Secondary | ICD-10-CM | POA: Diagnosis not present

## 2017-09-24 DIAGNOSIS — Z8249 Family history of ischemic heart disease and other diseases of the circulatory system: Secondary | ICD-10-CM

## 2017-09-24 DIAGNOSIS — I25119 Atherosclerotic heart disease of native coronary artery with unspecified angina pectoris: Secondary | ICD-10-CM | POA: Diagnosis not present

## 2017-09-24 DIAGNOSIS — I469 Cardiac arrest, cause unspecified: Secondary | ICD-10-CM

## 2017-09-24 DIAGNOSIS — E875 Hyperkalemia: Secondary | ICD-10-CM

## 2017-09-24 DIAGNOSIS — I5042 Chronic combined systolic (congestive) and diastolic (congestive) heart failure: Secondary | ICD-10-CM | POA: Diagnosis not present

## 2017-09-24 DIAGNOSIS — Z888 Allergy status to other drugs, medicaments and biological substances status: Secondary | ICD-10-CM

## 2017-09-24 DIAGNOSIS — I462 Cardiac arrest due to underlying cardiac condition: Secondary | ICD-10-CM | POA: Diagnosis present

## 2017-09-24 DIAGNOSIS — D509 Iron deficiency anemia, unspecified: Secondary | ICD-10-CM | POA: Diagnosis not present

## 2017-09-24 DIAGNOSIS — K5900 Constipation, unspecified: Secondary | ICD-10-CM | POA: Diagnosis present

## 2017-09-24 DIAGNOSIS — Z79899 Other long term (current) drug therapy: Secondary | ICD-10-CM

## 2017-09-24 DIAGNOSIS — I700 Atherosclerosis of aorta: Secondary | ICD-10-CM | POA: Diagnosis present

## 2017-09-24 DIAGNOSIS — I120 Hypertensive chronic kidney disease with stage 5 chronic kidney disease or end stage renal disease: Secondary | ICD-10-CM | POA: Diagnosis not present

## 2017-09-24 DIAGNOSIS — Z794 Long term (current) use of insulin: Secondary | ICD-10-CM

## 2017-09-24 DIAGNOSIS — R079 Chest pain, unspecified: Secondary | ICD-10-CM

## 2017-09-24 DIAGNOSIS — Z7982 Long term (current) use of aspirin: Secondary | ICD-10-CM

## 2017-09-24 DIAGNOSIS — I2510 Atherosclerotic heart disease of native coronary artery without angina pectoris: Secondary | ICD-10-CM | POA: Diagnosis present

## 2017-09-24 DIAGNOSIS — E1029 Type 1 diabetes mellitus with other diabetic kidney complication: Secondary | ICD-10-CM | POA: Diagnosis not present

## 2017-09-24 DIAGNOSIS — Z992 Dependence on renal dialysis: Secondary | ICD-10-CM

## 2017-09-24 DIAGNOSIS — Z87891 Personal history of nicotine dependence: Secondary | ICD-10-CM

## 2017-09-24 DIAGNOSIS — Z978 Presence of other specified devices: Secondary | ICD-10-CM | POA: Diagnosis not present

## 2017-09-24 DIAGNOSIS — K7689 Other specified diseases of liver: Secondary | ICD-10-CM | POA: Diagnosis not present

## 2017-09-24 DIAGNOSIS — E1042 Type 1 diabetes mellitus with diabetic polyneuropathy: Secondary | ICD-10-CM | POA: Diagnosis not present

## 2017-09-24 DIAGNOSIS — T82898A Other specified complication of vascular prosthetic devices, implants and grafts, initial encounter: Secondary | ICD-10-CM | POA: Diagnosis not present

## 2017-09-24 DIAGNOSIS — T82590A Other mechanical complication of surgically created arteriovenous fistula, initial encounter: Secondary | ICD-10-CM

## 2017-09-24 DIAGNOSIS — I361 Nonrheumatic tricuspid (valve) insufficiency: Secondary | ICD-10-CM | POA: Diagnosis not present

## 2017-09-24 DIAGNOSIS — R0789 Other chest pain: Secondary | ICD-10-CM | POA: Diagnosis not present

## 2017-09-24 DIAGNOSIS — I132 Hypertensive heart and chronic kidney disease with heart failure and with stage 5 chronic kidney disease, or end stage renal disease: Secondary | ICD-10-CM | POA: Diagnosis not present

## 2017-09-24 DIAGNOSIS — I259 Chronic ischemic heart disease, unspecified: Secondary | ICD-10-CM | POA: Diagnosis not present

## 2017-09-24 DIAGNOSIS — Z91048 Other nonmedicinal substance allergy status: Secondary | ICD-10-CM

## 2017-09-24 LAB — PROTIME-INR
INR: 1.04
Prothrombin Time: 13.5 seconds (ref 11.4–15.2)

## 2017-09-24 LAB — BLOOD GAS, ARTERIAL
Acid-base deficit: 10.2 mmol/L — ABNORMAL HIGH (ref 0.0–2.0)
Bicarbonate: 16 mmol/L — ABNORMAL LOW (ref 20.0–28.0)
FIO2: 100
O2 Saturation: 99.5 %
PCO2 ART: 40 mmHg (ref 32.0–48.0)
PH ART: 7.225 — AB (ref 7.350–7.450)
Patient temperature: 37
pO2, Arterial: 434 mmHg — ABNORMAL HIGH (ref 83.0–108.0)

## 2017-09-24 LAB — CBC WITH DIFFERENTIAL/PLATELET
Basophils Absolute: 0 10*3/uL (ref 0.0–0.1)
Basophils Relative: 0 %
EOS ABS: 0 10*3/uL (ref 0.0–0.7)
EOS PCT: 0 %
HCT: 45.2 % (ref 39.0–52.0)
Hemoglobin: 14.5 g/dL (ref 13.0–17.0)
LYMPHS ABS: 1.1 10*3/uL (ref 0.7–4.0)
Lymphocytes Relative: 11 %
MCH: 30.5 pg (ref 26.0–34.0)
MCHC: 32.1 g/dL (ref 30.0–36.0)
MCV: 95.2 fL (ref 78.0–100.0)
MONO ABS: 0.6 10*3/uL (ref 0.1–1.0)
Monocytes Relative: 6 %
NEUTROS PCT: 83 %
Neutro Abs: 8 10*3/uL — ABNORMAL HIGH (ref 1.7–7.7)
PLATELETS: 148 10*3/uL — AB (ref 150–400)
RBC: 4.75 MIL/uL (ref 4.22–5.81)
RDW: 17.2 % — ABNORMAL HIGH (ref 11.5–15.5)
WBC: 9.7 10*3/uL (ref 4.0–10.5)

## 2017-09-24 LAB — I-STAT CHEM 8, ED
BUN: 65 mg/dL — ABNORMAL HIGH (ref 6–20)
CALCIUM ION: 1.05 mmol/L — AB (ref 1.15–1.40)
CHLORIDE: 83 mmol/L — AB (ref 98–111)
Creatinine, Ser: 10.6 mg/dL — ABNORMAL HIGH (ref 0.61–1.24)
HCT: 47 % (ref 39.0–52.0)
Hemoglobin: 16 g/dL (ref 13.0–17.0)
Potassium: 7 mmol/L (ref 3.5–5.1)
Sodium: 120 mmol/L — ABNORMAL LOW (ref 135–145)
TCO2: 18 mmol/L — AB (ref 22–32)

## 2017-09-24 LAB — BASIC METABOLIC PANEL
ANION GAP: 30 — AB (ref 5–15)
BUN: 57 mg/dL — ABNORMAL HIGH (ref 6–20)
CO2: 19 mmol/L — ABNORMAL LOW (ref 22–32)
Calcium: 9.4 mg/dL (ref 8.9–10.3)
Chloride: 72 mmol/L — ABNORMAL LOW (ref 98–111)
Creatinine, Ser: 10.32 mg/dL — ABNORMAL HIGH (ref 0.61–1.24)
GFR, EST AFRICAN AMERICAN: 6 mL/min — AB (ref >60)
GFR, EST NON AFRICAN AMERICAN: 5 mL/min — AB (ref >60)
GLUCOSE: 893 mg/dL — AB (ref 70–99)
POTASSIUM: 7.3 mmol/L — AB (ref 3.5–5.1)
SODIUM: 121 mmol/L — AB (ref 135–145)

## 2017-09-24 LAB — I-STAT TROPONIN, ED: TROPONIN I, POC: 0.16 ng/mL — AB (ref 0.00–0.08)

## 2017-09-24 LAB — GLUCOSE, CAPILLARY
Glucose-Capillary: >600 mg/dL (ref 70–99)
Glucose-Capillary: 555 mg/dL (ref 70–99)
Glucose-Capillary: 493 mg/dL — ABNORMAL HIGH (ref 70–99)

## 2017-09-24 LAB — CBG MONITORING, ED: Glucose-Capillary: >600 mg/dL (ref 70–99)

## 2017-09-24 LAB — MRSA PCR SCREENING: MRSA BY PCR: NEGATIVE

## 2017-09-24 LAB — APTT: APTT: 28 s (ref 24–36)

## 2017-09-24 MED ORDER — SODIUM BICARBONATE 8.4 % IV SOLN
INTRAVENOUS | Status: AC | PRN
  Administered 2017-09-24 (×2): 50 meq via INTRAVENOUS

## 2017-09-24 MED ORDER — LIDOCAINE HCL (CARDIAC) PF 100 MG/5ML IV SOSY
PREFILLED_SYRINGE | INTRAVENOUS | Status: AC | PRN
  Administered 2017-09-24: 100 mg via INTRAVENOUS

## 2017-09-24 MED ORDER — MORPHINE SULFATE (PF) 4 MG/ML IV SOLN
4.0000 mg | Freq: Once | INTRAVENOUS | Status: DC
  Filled 2017-09-24: qty 1

## 2017-09-24 MED ORDER — ETOMIDATE 2 MG/ML IV SOLN
INTRAVENOUS | Status: AC | PRN
  Administered 2017-09-24: 10 mg via INTRAVENOUS

## 2017-09-24 MED ORDER — DEXTROSE 50 % IV SOLN
INTRAVENOUS | Status: AC
  Administered 2017-09-24: 19:00:00
  Filled 2017-09-24: qty 50

## 2017-09-24 MED ORDER — PROPOFOL 1000 MG/100ML IV EMUL
5.0000 ug/kg/min | Freq: Once | INTRAVENOUS | Status: DC
  Administered 2017-09-24: 5 ug/kg/min via INTRAVENOUS

## 2017-09-24 MED ORDER — SODIUM CHLORIDE 0.9 % IV SOLN
INTRAVENOUS | Status: AC
  Filled 2017-09-24: qty 1

## 2017-09-24 MED ORDER — ORAL CARE MOUTH RINSE
15.0000 mL | OROMUCOSAL | Status: DC
  Administered 2017-09-25 (×3): 15 mL via OROMUCOSAL

## 2017-09-24 MED ORDER — INSULIN ASPART 100 UNIT/ML ~~LOC~~ SOLN
10.0000 [IU] | Freq: Once | SUBCUTANEOUS | Status: AC
  Administered 2017-09-24: 10 [IU] via INTRAVENOUS

## 2017-09-24 MED ORDER — SODIUM CHLORIDE 0.9 % IV SOLN
INTRAVENOUS | Status: DC
  Administered 2017-09-24: 20 [IU]/h via INTRAVENOUS
  Administered 2017-09-24: 9.9 [IU]/h via INTRAVENOUS
  Filled 2017-09-24 (×3): qty 1

## 2017-09-24 MED ORDER — ASPIRIN 81 MG PO CHEW
81.0000 mg | CHEWABLE_TABLET | Freq: Every day | ORAL | Status: DC
  Administered 2017-09-25 – 2017-09-26 (×2): 81 mg
  Filled 2017-09-24 (×2): qty 1

## 2017-09-24 MED ORDER — PROPOFOL 1000 MG/100ML IV EMUL
INTRAVENOUS | Status: AC
  Administered 2017-09-24: 5 ug/kg/min via INTRAVENOUS
  Filled 2017-09-24: qty 100

## 2017-09-24 MED ORDER — SODIUM CHLORIDE 0.9 % FOR CRRT
INTRAVENOUS_CENTRAL | Status: DC | PRN
  Filled 2017-09-24: qty 1000

## 2017-09-24 MED ORDER — CHLORHEXIDINE GLUCONATE 0.12% ORAL RINSE (MEDLINE KIT)
15.0000 mL | Freq: Two times a day (BID) | OROMUCOSAL | Status: DC
  Administered 2017-09-25: 15 mL via OROMUCOSAL

## 2017-09-24 MED ORDER — PROPOFOL 1000 MG/100ML IV EMUL: 5.0000 ug/kg/min | INTRAVENOUS | Status: DC

## 2017-09-24 MED ORDER — CALCIUM CHLORIDE 10 % IV SOLN
INTRAVENOUS | Status: AC | PRN
  Administered 2017-09-24 (×2): 1 g via INTRAVENOUS

## 2017-09-24 MED ORDER — MIDAZOLAM HCL 5 MG/5ML IJ SOLN
4.0000 mg | Freq: Once | INTRAMUSCULAR | Status: AC
  Administered 2017-09-24: 4 mg via INTRAVENOUS

## 2017-09-24 MED ORDER — CALCIUM CHLORIDE 10 % IV SOLN
INTRAVENOUS | Status: AC
  Filled 2017-09-24: qty 10

## 2017-09-24 MED ORDER — INSULIN ASPART 100 UNIT/ML ~~LOC~~ SOLN
SUBCUTANEOUS | Status: AC
  Administered 2017-09-24: 7 [IU] via INTRAVENOUS
  Filled 2017-09-24: qty 1

## 2017-09-24 MED ORDER — INSULIN ASPART 100 UNIT/ML ~~LOC~~ SOLN
7.0000 [IU] | Freq: Once | SUBCUTANEOUS | Status: AC
  Administered 2017-09-24: 7 [IU] via INTRAVENOUS

## 2017-09-24 MED ORDER — NOREPINEPHRINE 4 MG/250ML-% IV SOLN
0.0000 ug/min | INTRAVENOUS | Status: DC
  Administered 2017-09-25 (×2): 2 ug/min via INTRAVENOUS
  Filled 2017-09-24 (×2): qty 250

## 2017-09-24 MED ORDER — AMIODARONE HCL 150 MG/3ML IV SOLN
INTRAVENOUS | Status: AC | PRN
  Administered 2017-09-24: 300 mg via INTRAVENOUS

## 2017-09-24 MED ORDER — EPINEPHRINE PF 1 MG/10ML IJ SOSY
PREFILLED_SYRINGE | INTRAMUSCULAR | Status: AC | PRN
  Administered 2017-09-24: 1 mg via INTRAVENOUS

## 2017-09-24 MED ORDER — PRISMASOL BGK 0/2.5 32-2.5 MEQ/L IV SOLN
INTRAVENOUS | Status: DC
  Administered 2017-09-24: 23:00:00 via INTRAVENOUS_CENTRAL
  Filled 2017-09-24 (×3): qty 5000

## 2017-09-24 MED ORDER — HEPARIN SODIUM (PORCINE) 1000 UNIT/ML DIALYSIS
1000.0000 [IU] | INTRAMUSCULAR | Status: DC | PRN
  Administered 2017-09-26: 2800 [IU] via INTRAVENOUS_CENTRAL
  Filled 2017-09-24 (×2): qty 6
  Filled 2017-09-24: qty 3

## 2017-09-24 MED ORDER — ALBUTEROL SULFATE (2.5 MG/3ML) 0.083% IN NEBU
INHALATION_SOLUTION | RESPIRATORY_TRACT | Status: AC
  Filled 2017-09-24: qty 12

## 2017-09-24 MED ORDER — FENTANYL CITRATE (PF) 100 MCG/2ML IJ SOLN
100.0000 ug | INTRAMUSCULAR | Status: DC | PRN
  Filled 2017-09-24: qty 2

## 2017-09-24 MED ORDER — DOCUSATE SODIUM 50 MG/5ML PO LIQD: 100.0000 mg | Freq: Two times a day (BID) | ORAL | Status: DC | PRN

## 2017-09-24 MED ORDER — ONDANSETRON HCL 4 MG/2ML IJ SOLN
4.0000 mg | Freq: Once | INTRAMUSCULAR | Status: DC
  Filled 2017-09-24: qty 2

## 2017-09-24 MED ORDER — PHENYLEPHRINE 40 MCG/ML (10ML) SYRINGE FOR IV PUSH (FOR BLOOD PRESSURE SUPPORT)
80.0000 ug | PREFILLED_SYRINGE | Freq: Once | INTRAVENOUS | Status: DC | PRN
  Filled 2017-09-24: qty 5

## 2017-09-24 MED ORDER — MIDAZOLAM HCL 5 MG/5ML IJ SOLN
INTRAMUSCULAR | Status: AC
  Administered 2017-09-24: 4 mg via INTRAVENOUS
  Filled 2017-09-24: qty 5

## 2017-09-24 MED ORDER — HEPARIN SODIUM (PORCINE) 5000 UNIT/ML IJ SOLN
5000.0000 [IU] | Freq: Three times a day (TID) | INTRAMUSCULAR | Status: DC
  Administered 2017-09-25: 5000 [IU] via SUBCUTANEOUS
  Filled 2017-09-24: qty 1

## 2017-09-24 MED ORDER — ASPIRIN 300 MG RE SUPP
300.0000 mg | RECTAL | Status: DC
  Filled 2017-09-24: qty 1

## 2017-09-24 MED ORDER — PRISMASOL BGK 0/2.5 32-2.5 MEQ/L IV SOLN
INTRAVENOUS | Status: DC
  Administered 2017-09-24 – 2017-09-25 (×2): via INTRAVENOUS_CENTRAL
  Filled 2017-09-24 (×7): qty 5000

## 2017-09-24 MED ORDER — PANTOPRAZOLE SODIUM 40 MG PO PACK
40.0000 mg | PACK | Freq: Every day | ORAL | Status: DC
  Filled 2017-09-24: qty 20

## 2017-09-24 MED ORDER — ROCURONIUM BROMIDE 50 MG/5ML IV SOLN
INTRAVENOUS | Status: AC | PRN
  Administered 2017-09-24: 100 mg via INTRAVENOUS

## 2017-09-24 MED ORDER — SODIUM CHLORIDE 0.9 % IV BOLUS
500.0000 mL | Freq: Once | INTRAVENOUS | Status: AC
  Administered 2017-09-24: 500 mL via INTRAVENOUS

## 2017-09-24 MED ORDER — PRISMASOL BGK 0/2.5 32-2.5 MEQ/L IV SOLN
INTRAVENOUS | Status: DC
  Administered 2017-09-24: 23:00:00 via INTRAVENOUS_CENTRAL
  Filled 2017-09-24 (×4): qty 5000

## 2017-09-24 MED ORDER — BISACODYL 10 MG RE SUPP: 10.0000 mg | Freq: Every day | RECTAL | Status: DC | PRN

## 2017-09-24 MED ORDER — ALBUTEROL SULFATE (2.5 MG/3ML) 0.083% IN NEBU
10.0000 mg | INHALATION_SOLUTION | Freq: Once | RESPIRATORY_TRACT | Status: AC
  Administered 2017-09-24: 10 mg via RESPIRATORY_TRACT

## 2017-09-24 MED ORDER — HEPARIN SODIUM (PORCINE) 1000 UNIT/ML IJ SOLN
2800.0000 [IU] | Freq: Once | INTRAMUSCULAR | Status: DC
  Filled 2017-09-24: qty 3

## 2017-09-24 MED ORDER — SODIUM BICARBONATE 8.4 % IV SOLN
INTRAVENOUS | Status: AC
  Filled 2017-09-24: qty 50

## 2017-09-24 MED ORDER — FENTANYL CITRATE (PF) 100 MCG/2ML IJ SOLN
100.0000 ug | INTRAMUSCULAR | Status: AC | PRN
  Administered 2017-09-24: 50 ug via INTRAVENOUS
  Administered 2017-09-25 (×2): 100 ug via INTRAVENOUS
  Filled 2017-09-24 (×2): qty 2

## 2017-09-24 MED ORDER — INSULIN ASPART 100 UNIT/ML ~~LOC~~ SOLN
SUBCUTANEOUS | Status: AC
  Filled 2017-09-24: qty 1

## 2017-09-24 NOTE — Consult Note (Signed)
[facility] Consult Note   Primary Physician:  Baltazar Apo  Primary Cardiologist:   Riccardo Dubin (at [facility])  Reason for Consultation:  Cardiac arrest now with ROSC  HPI:    LEGION DISCHER III is a 47 year old gentleman with a past medical history significant for end-stage renal disease (on hemodialysis), diastolic heart failure (EF 79%, grade 2 diastolic dysfunction), uncontrolled type 1 diabetes mellitus, hypertension, hyperlipidemia, anemia and peripheral arterial disease (s/p right BKA) who is status post cardiac arrest likely secondary to hyperkalemia status post successful resuscitation.  Patient had initially presented with chest pain, lightheadedness, nausea and vomiting. He then became unresponsive with V. Fib on the monitor. CPR was initiated. He was treated with D50, insulin gtt, bicarbonate and calcium. He was also given doses of epinephrine, amiodarone and IV fluids. He is currently intubated and sedated.  The patient has been evaluated by the cardiology service at [facility] since he is being considered for a kidney/pancreas transplant. His recent echocardiogram had revealed a reduction in his LV function. He then underwent a RHC/LHC on 04/01/17. He was found to have diffuse three-vessel coronary artery disease with calcified vessels. Cardiac index was 2.1. Based on his coronary anatomy he was treated with medical therapy alone rather than CABG.     Cardiac catheterization at [facility] February 2019 RCA lesion (70%) in the proximal to mid portion, left circumflex lesion of approximately 70% but relatively insignificant left anterior descending artery disease except for a 70 to 80% lesion in a proximal diagonal.  Echo at [facility] 09/18/2017 Moderate LV dysfunction, global hypokinesis, ejection fraction 35%, mild LVH, normal RV function, trivial AR/MR/TR. No valvular stenosis. Aortic sclerosis  Echocardiogram August 2012: Mild LVH with LVEF 65-70%, grade 2  diastolic dysfunction, mildly calcified aortic valve, mild mitral regurgitation, left atrial enlargement, trivial tricuspid regurgitation    Home Medications Prior to Admission medications   Medication Sig Start Date End Date Taking? Authorizing Provider  acetaminophen (TYLENOL) 500 MG tablet Take 1,000 mg by mouth every 6 (six) hours as needed (for pain).    [provider]  aspirin EC 325 MG EC tablet Take 1 tablet (325 mg total) by mouth daily. 06/18/17   Angiulli, Lavon Paganini, PA-C  calcitRIOL (ROCALTROL) 0.25 MCG capsule Take 1 capsule (0.25 mcg total) by mouth every Monday, Wednesday, and Friday with hemodialysis. Patient taking differently: Take 0.25 mcg by mouth See admin instructions. Take 0.25 mcg by mouth once a day on Mon/Tues/Thurs/Fri with home dialysis 06/18/17   Angiulli, Lavon Paganini, PA-C  calcium acetate (PHOSLO) 667 MG capsule Take 1 capsule (667 mg total) by mouth 3 (three) times daily. Patient taking differently: Take 667 mg by mouth 3 (three) times daily with meals.  06/18/17   Angiulli, Lavon Paganini, PA-C  carvedilol (COREG) 3.125 MG tablet Take 1 tablet (3.125 mg total) by mouth 2 (two) times daily with a meal. Patient taking differently: Take 6.25 mg by mouth 2 (two) times daily with a meal.  06/18/17   Angiulli, Lavon Paganini, PA-C  Continuous Blood Gluc Receiver (FREESTYLE LIBRE 14 DAY READER) DEVI 1 each by Does not apply route every 14 (fourteen) days. 07/29/17   Cassandria Anger, MD  Continuous Blood Gluc Sensor (FREESTYLE LIBRE 14 DAY SENSOR) MISC 1 each by Does not apply route every 14 (fourteen) days. 07/21/17   Cassandria Anger, MD  Darbepoetin Alfa (ARANESP) 60 MCG/0.3ML SOSY injection Inject 0.3 mLs (60 mcg total) into the vein every Monday with  hemodialysis. 06/23/17   Angiulli, Lavon Paganini, PA-C  docusate sodium (COLACE) 100 MG capsule Take 1 capsule (100 mg total) by mouth 2 (two) times daily. Patient taking differently: Take 200 mg by mouth See admin instructions.  Take 200 mg by mouth once a day when taking pain meds 06/18/17   Angiulli, Lavon Paganini, PA-C  gabapentin (NEURONTIN) 100 MG capsule Take 1 capsule (100 mg total) by mouth daily. Patient taking differently: Take 100 mg by mouth See admin instructions. Take 100 mg by mouth once a day on Sun/Wed/Sat 06/18/17   Angiulli, Lavon Paganini, PA-C  gabapentin (NEURONTIN) 300 MG capsule Take 1 capsule (300 mg total) by mouth every Monday, Wednesday, and Friday with hemodialysis. Patient taking differently: Take 300 mg by mouth See admin instructions. Take 300 mg by mouth once a day on Mon/Tues/Thurs/Fri 06/18/17   Angiulli, Lavon Paganini, PA-C  insulin glargine (LANTUS) 100 unit/mL SOPN Inject 0.25 mLs (25 Units total) into the skin at bedtime. 07/01/17   Cassandria Anger, MD  Insulin Lispro (HUMALOG KWIKPEN [place]) Inject 15-20 Units into the skin 3 (three) times daily before meals.     [provider]  methocarbamol (ROBAXIN) 500 MG tablet Take 1 tablet (500 mg total) by mouth every 6 (six) hours as needed for muscle spasms. 08/08/17   Bonnielee Haff, MD  ondansetron (ZOFRAN ODT) 4 MG disintegrating tablet Take 1 tablet (4 mg total) by mouth every 8 (eight) hours as needed for nausea or vomiting. 07/01/17   [name], MD  oxyCODONE-acetaminophen (PERCOCET/ROXICET) 5-325 MG tablet Take 1 tablet by mouth every 6 (six) hours as needed for severe pain. 08/08/17   Bonnielee Haff, MD  pantoprazole (PROTONIX) 40 MG tablet Take 1 tablet (40 mg total) by mouth daily. 08/08/17 09/07/17  Bonnielee Haff, MD  pantoprazole (PROTONIX) 40 MG tablet TAKE 1 TABLET BY MOUTH EVERY DAY 09/08/17   [name], MD  pentoxifylline (TRENTAL) 400 MG CR tablet Take 1 tablet (400 mg total) by mouth 3 (three) times daily with meals. 06/26/17   [name], MD  polyethylene glycol [facility]) packet Take 17 g by mouth daily. Patient taking differently: Take 17 g by mouth See admin instructions. Take 17 grams by mouth, mixed, once a day only  when taking pain meds 07/03/17   Jamse Arn, MD  sucroferric oxyhydroxide (VELPHORO) 500 MG chewable tablet Chew 3 tablets (1,500 mg total) by mouth 3 (three) times daily. 06/18/17   Angiulli, Lavon Paganini, PA-C    Past Medical History: Past Medical History:  Diagnosis Date  . Aortic stenosis    Very mild in 05/2007  . Arthritis    "right shoulder; right knee; feel like I've got it all over" (11/28/2015)  . CHF (congestive heart failure) ([place])   . Degenerative joint disease    Left TKA  . Diastolic heart failure (HCC)    LVEF 65-70% with grade 2 diastolic dysfunction  . ESRD (end stage renal disease) on dialysis [facility])    (as of 09/24/2017) pt does HD at home on a M/Tu/Th schedule.  . Essential hypertension, benign 2000   LVH  . GERD (gastroesophageal reflux disease)   . HCAP (healthcare-associated pneumonia) 11/28/2015  . Hyperlipidemia 2000  . Loculated pleural effusion 11/28/2015   [name] 11/28/2015  . Pneumonia 12/2013; 05/2014  . PSVT (paroxysmal supraventricular tachycardia) (HCC)    Nonsustained; asymptomatic; diagnosed by event recorder in 2006  . Tobacco abuse, in remission    20 pack years; discontinued  in 1985; bronchitic changes on chest x-ray and 2009  . Type 1 diabetes mellitus ([place]) 1990    Past Surgical History: Past Surgical History:  Procedure Laterality Date  . AMPUTATION Right 06/06/2017   Procedure: RIGHT BELOW KNEE AMPUTATION;  Surgeon: [name], MD;  Location: [place];  Service: Orthopedics;  Laterality: Right;  . APPLICATION OF WOUND VAC Right 07/25/2017   Procedure: APPLICATION OF WOUND VAC;  Surgeon: [name], MD;  Location: [place];  Service: Orthopedics;  Laterality: Right;  . AV FISTULA PLACEMENT Right 06/23/2014   Procedure: ARTERIOVENOUS (AV) FISTULA CREATION;  Surgeon: Angelia Mould, MD;  Location: [place];  Service: Vascular;  Laterality: Right;  . CARDIAC CATHETERIZATION     [facility]- evaluation for Kidney/ pancreas transplant  . EYE  SURGERY Bilateral    "for glaucoma"  . INGUINAL HERNIA REPAIR Right    As a child  . LOWER EXTREMITY ANGIOGRAPHY N/A 05/27/2017   Procedure: LOWER EXTREMITY ANGIOGRAPHY;  Surgeon: [name], MD;  Location: [place] CV LAB;  Service: Cardiovascular;  Laterality: N/A;  . STUMP REVISION Right 07/25/2017   Procedure: REVISION RIGHT BELOW KNEE AMPUTATION;;  Surgeon: [name], MD;  Location: [place];  Service: Orthopedics;  Laterality: Right;  . WISDOM TOOTH EXTRACTION  1987    Family History: Family History  Problem Relation Age of Onset  . Diabetes Father   . Hypertension Father   . Diabetes Mother   . Hypertension Mother     Social History: Social History   Socioeconomic History  . Marital status: Married    Spouse name: Not on file  . Number of children: Not on file  . Years of education: Not on file  . Highest education level: Not on file  Occupational History  . Not on file  Social Needs  . Financial resource strain: Not on file  . Food insecurity:    Worry: Not on file    Inability: Not on file  . Transportation needs:    Medical: Not on file    Non-medical: Not on file  Tobacco Use  . Smoking status: Never Smoker  . Smokeless tobacco: Never Used  Substance and Sexual Activity  . Alcohol use: Yes    Comment: 11/28/2015 "used to drink; stopped ~ 2 yr ago"  . Drug use: No  . Sexual activity: Yes  Lifestyle  . Physical activity:    Days per week: Not on file    Minutes per session: Not on file  . Stress: Not on file  Relationships  . Social connections:    Talks on phone: Not on file    Gets together: Not on file    Attends religious service: Not on file    Active member of club or organization: Not on file    Attends meetings of clubs or organizations: Not on file    Relationship status: Not on file  Other Topics Concern  . Not on file  Social History Narrative  . Not on file    Allergies:  Allergies  Allergen Reactions  . Atorvastatin  Other (See Comments)    Muscle pain   . Minoxidil Other (See Comments)    Fluid retention   . Adhesive [Tape] Other (See Comments)    MUST USE "SILK TAPE," AS ANY OTHER "BURNS" THE SKIN  . Statins Other (See Comments)    Muscle pain     Review of Systems: [y] = yes, '[ ]'  = no ...............Marland Kitchen  Unable to assess since patient is intubated  . General: Weight gain '[ ]' ; Weight loss '[ ]' ; Anorexia '[ ]' ; Fatigue '[ ]' ; Fever '[ ]' ; Chills '[ ]' ; Weakness '[ ]'   . Cardiac: Chest pain/pressure '[ ]' ; Resting SOB '[ ]' ; Exertional SOB '[ ]' ; Orthopnea '[ ]' ; Pedal Edema '[ ]' ; Palpitations '[ ]' ; Syncope '[ ]' ; Presyncope '[ ]' ; Paroxysmal nocturnal dyspnea'[ ]'   . Pulmonary: Cough '[ ]' ; Wheezing'[ ]' ; Hemoptysis'[ ]' ; Sputum '[ ]' ; Snoring '[ ]'   . GI: Vomiting'[ ]' ; Dysphagia'[ ]' ; Melena'[ ]' ; Hematochezia '[ ]' ; Heartburn'[ ]' ; Abdominal pain '[ ]' ; Constipation '[ ]' ; Diarrhea '[ ]' ; BRBPR '[ ]'   . GU: Hematuria'[ ]' ; Dysuria '[ ]' ; Nocturia'[ ]'   . Vascular: Pain in legs with walking '[ ]' ; Pain in feet with lying flat '[ ]' ; Non-healing sores '[ ]' ; Stroke '[ ]' ; TIA '[ ]' ; Slurred speech '[ ]' ;  . Neuro: Headaches'[ ]' ; Vertigo'[ ]' ; Seizures'[ ]' ; Paresthesias'[ ]' ;Blurred vision '[ ]' ; Diplopia '[ ]' ; Vision changes '[ ]'   . Ortho/Skin: Arthritis '[ ]' ; Joint pain '[ ]' ; Muscle pain '[ ]' ; Joint swelling '[ ]' ; Back Pain '[ ]' ; Rash '[ ]'   . Psych: Depression'[ ]' ; Anxiety'[ ]'   . Heme: Bleeding problems '[ ]' ; Clotting disorders '[ ]' ; Anemia '[ ]'   . Endocrine: Diabetes '[ ]' ; Thyroid dysfunction'[ ]'      Objective:    Vital Signs:   Temp:  [94.3 F (34.6 C)-98.8 F (37.1 C)] 98.1 F (36.7 C) (08/14 2245) Pulse Rate:  [70-108] 97 (08/14 2245) Resp:  [3-27] 6 (08/14 2245) BP: (66-205)/(46-106) 106/58 (08/14 2230) SpO2:  [96 %-100 %] 100 % (08/14 2245) FiO2 (%):  [40 %-100 %] 40 % (08/14 2045) Weight:  [96.8 kg] 96.8 kg (08/14 2050)    Weight change: Filed Weights   09/24/17 2050  Weight: 96.8 kg    Intake/Output:   Intake/Output Summary (Last 24 hours) at 09/24/2017 2323 Last  data filed at 09/24/2017 2100 Gross per 24 hour  Intake 567.25 ml  Output -  Net 567.25 ml      Physical Exam    General:  Patient is intubated HEENT: normal Neck: supple. JVP . Carotids 2+ bilat; no bruits. No lymphadenopathy or thyromegaly appreciated. Cor: PMI nondisplaced. Regular rate & rhythm. No rubs, gallops or murmurs. Lungs: clear Abdomen: soft, nontender, nondistended. No hepatosplenomegaly. No bruits or masses. Good bowel sounds. Extremities: no cyanosis, clubbing, rash, edema Neuro: Unable to assess    EKG    Sinus tachycardia, rate 100 bpm, voltage criteria for LVH, prolonged QT (QTC 528 ms)  Labs   Basic Metabolic Panel: Recent Labs  Lab 09/24/17 1709 09/24/17 1822  NA 121* 120*  K 7.3* 7.0*  CL 72* 83*  CO2 19*  --   GLUCOSE 893* >700*  BUN 57* 65*  CREATININE 10.32* 10.60*  CALCIUM 9.4  --     Liver Function Tests: No results for input(s): AST, ALT, ALKPHOS, BILITOT, PROT, ALBUMIN in the last 168 hours. No results for input(s): LIPASE, AMYLASE in the last 168 hours. No results for input(s): AMMONIA in the last 168 hours.  CBC: Recent Labs  Lab 09/24/17 1709 09/24/17 1822  WBC 9.7  --   NEUTROABS 8.0*  --   HGB 14.5 16.0  HCT 45.2 47.0  MCV 95.2  --   PLT 148*  --     Cardiac Enzymes: No results for input(s): CKTOTAL, CKMB, CKMBINDEX, TROPONINI in the last 168 hours.  BNP: BNP (last 3 results)  No results for input(s): BNP in the last 8760 hours.  ProBNP (last 3 results) No results for input(s): PROBNP in the last 8760 hours.   CBG: Recent Labs  Lab 09/24/17 1716 09/24/17 1756 09/24/17 1933 09/24/17 2056 09/24/17 2200  GLUCAP >600* >600* >600* >600* 555*    Coagulation Studies: Recent Labs    09/24/17 1847  LABPROT 13.5  INR 1.04     Imaging   Dg Chest Port 1 View  Result Date: 09/24/2017 CLINICAL DATA:  Check endotracheal tube placement EXAM: PORTABLE CHEST 1 VIEW COMPARISON:  Film from earlier in the same  day. FINDINGS: Endotracheal tube and nasogastric catheter are again seen and stable. Right jugular central line is noted at the cavoatrial junction. No pneumothorax is seen. Right subclavian vein stent is noted. The lungs are well aerated bilaterally. IMPRESSION: No pneumothorax following central line placement. Electronically Signed   By: [name] M.D.   On: 09/24/2017 23:09   Dg Chest Port 1 View  Result Date: 09/24/2017 CLINICAL DATA:  Initial evaluation for acute chest pain, status post CPR. EXAM: PORTABLE CHEST 1 VIEW COMPARISON:  Prior radiograph from 08/07/2017 FINDINGS: Endotracheal tube in place with tip positioned 3.9 cm above the carina. Enteric tube courses into the abdomen. Defibrillator pads overlie the left chest. Moderate cardiomegaly, stable.  Mediastinal silhouette normal. Lungs mildly hypoinflated. Diffuse vascular and interstitial prominence consistent with mild diffuse pulmonary interstitial edema. No focal infiltrates. No pleural effusion. No pneumothorax. No acute osseous abnormality. Vascular stent overlies the distal right subclavian region. IMPRESSION: 1. Support apparatus in satisfactory position as above. 2. Cardiomegaly with mild diffuse pulmonary interstitial edema. Electronically Signed   By: Jeannine Boga M.D.   On: 09/24/2017 18:44      Medications:     Current Medications: . albuterol      . aspirin  300 mg Rectal NOW  . chlorhexidine gluconate (MEDLINE KIT)  15 mL Mouth Rinse BID  . heparin  2,800 Units Intravenous Once  . [START ON 09/25/2017] mouth rinse  15 mL Mouth Rinse 10 times per day     Infusions: . insulin (NOVOLIN-R) infusion 9.9 Units/hr (09/24/17 2208)  . norepinephrine (LEVOPHED) Adult infusion    . dialysis replacement fluid (prismasate)    . dialysis replacement fluid (prismasate)    . dialysate (PRISMASATE)    . sodium chloride         Assessment/Plan   1. Cardiac arrest due to ventricular fibrillation (mostly likely  from hyperkalemia) status post successful resuscitation  - Continue to correct metabolic derangements. - Trend cardiac biomarkers and obtain serial ECGs - Give aspirin rectally if the patient is not high risk for bleeding - Maintain adequate perfusion pressure with pressors as needed - Would hold lisinopril and carvedilol due to tenuous blood pressures. - Fluid management per recommendations from the Nephrology service - Given absence of ischemic changes on EKG, cardiac arrest is likely not from a primary cardiac etiology - Obtain a transthoracic echocardiogram to reevaluate the LV systolic and diastolic function - Given minimal elevation in troponin will hold off on IV heparin   Meade Maw, MD  09/24/2017, 11:23 PM  Cardiology Overnight Team Please contact [facility] for night-coverage after hours (4p -7a ) and weekends on [email or web address]

## 2017-09-24 NOTE — ED Notes (Signed)
ROCS returned at 1746.

## 2017-09-24 NOTE — Consult Note (Signed)
Reason for Consult: ESRD Referring Physician:  Dr. Einar Gip  Chief Complaint:  Chest pain  Assessment/Plan: 1. ESRD - home HD MTTHF NxStage 3.5hr through rt BCF. His BP despite 1.5L bolus + currently another Liter upon arrival at [facility] 2H is 77/52. He will not tolerate conventional HD and the fistula will also likely have significant recirculation. Appreciate CCM placing a HD catheter to initiate CRRT with no UF on a 0K bath. 2. Uncontrolled T1DM: on insulin gtt which may eventually result in a low potassium. Will monitor closely and adjust bath as needed. 3. HTN: meds on hold; currently hypotensive req boluses of isotonic fluid. 4. Anemia - will monitor; iron and ESA not needed at this time.  5. PAD - s/p recent R BKA after poor response to a TMA. 6. S/p cardiac arrest with successful resuscitation      HPI: Lance [name] is an 47 y.o. 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 Pt is on home HD with Dr. Posey Pronto with dialysis on MTThFri for 3.5hr and a EDW of 96.5kg through a rt BCF. He is presenting with CP, nausea and vomiting complicated by a pulseless arrest requiring resuscitation at the [facility] ED. Potassium was found to be elevated and pt was treated with D50, Insulin gtt, bicarb + Calcium. Treated with cooling protocol and transferred to [facility].    ROS Pertinent items are noted in HPI.  Chemistry and CBC: Creat  Date/Time Value Ref Range Status  03/26/2013 03:20 PM 2.21 (H) 0.50 - 1.35 mg/dL Final  02/24/2012 04:15 PM 1.94 (H) 0.50 - 1.35 mg/dL Final  12/31/2011 08:00 AM 1.92 (H) 0.50 - 1.35 mg/dL Final   Creatinine, Ser  Date/Time Value Ref Range Status  09/24/2017 06:22 PM 10.60 (H) 0.61 - 1.24 mg/dL Final  09/24/2017 05:09 PM 10.32 (H)  0.61 - 1.24 mg/dL Final  08/08/2017 06:31 AM 7.92 (H) 0.61 - 1.24 mg/dL Final  08/07/2017 03:04 AM 9.97 (H) 0.61 - 1.24 mg/dL Final  06/18/2017 08:07 AM 11.04 (H) 0.61 - 1.24 mg/dL Final  06/16/2017 01:49 PM 14.38 (H) 0.61 - 1.24 mg/dL Final  06/13/2017 05:12 PM 13.02 (H) 0.61 - 1.24 mg/dL Final  06/11/2017 04:39 PM 13.10 (H) 0.61 - 1.24 mg/dL Final  06/09/2017 07:37 AM 13.86 (H) 0.61 - 1.24 mg/dL Final  06/07/2017 11:42 AM 9.62 (H) 0.61 - 1.24 mg/dL Final  06/06/2017 08:12 AM 7.08 (H) 0.61 - 1.24 mg/dL Final  05/27/2017 10:37 AM 9.80 (H) 0.61 - 1.24 mg/dL Final  02/18/2017 02:38 PM 11.23 (H) 0.76 - 1.27 mg/dL Final    Comment:    **Verified by repeat analysis**  12/04/2015 12:30 PM 11.01 (H) 0.61 - 1.24 mg/dL Final    Comment:    DELTA CHECK NOTED  12/03/2015 02:46 AM 7.23 (H) 0.61 - 1.24 mg/dL Final  12/02/2015 04:23 AM 8.19 (H) 0.61 - 1.24 mg/dL Final  12/01/2015 07:13 AM 12.10 (H) 0.61 - 1.24 mg/dL Final  11/30/2015 02:37 AM 8.98 (H) 0.61 - 1.24 mg/dL Final  11/29/2015 12:58 PM 12.16 (H) 0.61 - 1.24 mg/dL Final  11/28/2015 04:18 PM 10.49 (H) 0.61 - 1.24 mg/dL Final  11/16/2015 06:08 AM 9.26 (H) 0.61 - 1.24 mg/dL Final  11/15/2015 07:45 AM 13.34 (H) 0.61 - 1.24 mg/dL Final  11/14/2015 05:56 AM 11.30 (H) 0.61 - 1.24 mg/dL Final  11/13/2015 02:56 PM 10.21 (H) 0.61 - 1.24 mg/dL Final  11/13/2015 02:40 AM 8.60 (H) 0.61 - 1.24 mg/dL Final  11/13/2015 02:40 AM 8.57 (H) 0.61 - 1.24 mg/dL Final  11/12/2015 11:55 PM 8.03 (H) 0.61 - 1.24 mg/dL Final  11/12/2015 09:11 PM 6.82 (H) 0.61 - 1.24 mg/dL Final  11/12/2015 01:50 PM 13.53 (H) 0.61 - 1.24 mg/dL Final  11/12/2015 09:56 AM 13.38 (H) 0.61 - 1.24 mg/dL Final  11/12/2015 07:15 AM 12.88 (H) 0.61 - 1.24 mg/dL Final  11/12/2015 04:50 AM 12.35 (H) 0.61 - 1.24 mg/dL Final  11/12/2015 03:05 AM 12.35 (H) 0.61 - 1.24 mg/dL Final  05/07/2014 06:03 AM 4.98 (H) 0.50 - 1.35 mg/dL Final  05/06/2014 05:27 AM 4.77 (H) 0.50 - 1.35 mg/dL Final   05/05/2014 06:57 AM 4.61 (H) 0.50 - 1.35 mg/dL Final  12/29/2013 06:00 AM 3.31 (H) 0.50 - 1.35 mg/dL Final  12/28/2013 07:07 AM 3.62 (H) 0.50 - 1.35 mg/dL Final  12/27/2013 04:23 AM 4.54 (H) 0.50 - 1.35 mg/dL Final  12/26/2013 05:11 AM 5.24 (H) 0.50 - 1.35 mg/dL Final  12/25/2013 07:40 PM 5.32 (H) 0.50 - 1.35 mg/dL Final  12/25/2013 08:45 AM 4.99 (H) 0.50 - 1.35 mg/dL Final  12/24/2013 06:03 AM 3.95 (H) 0.50 - 1.35 mg/dL Final  12/23/2013 10:58 PM 3.73 (H) 0.50 - 1.35 mg/dL Final  12/23/2013 09:05 PM 3.62 (H) 0.50 - 1.35 mg/dL Final  12/23/2013 06:50 PM 3.40 (H) 0.50 - 1.35 mg/dL Final  12/23/2013 02:01 PM 3.33 (H) 0.50 - 1.35 mg/dL Final  12/22/2012 07:57 AM 2.16 (H) 0.50 - 1.35 mg/dL Final  09/27/2010 04:36 AM 1.19 0.50 - 1.35 mg/dL Final  09/26/2010 06:46 AM 1.34 0.50 - 1.35 mg/dL Final   Recent Labs  Lab 09/24/17 1709 09/24/17 1822  NA 121* 120*  K 7.3* 7.0*  CL 72* 83*  CO2 19*  --   GLUCOSE 893* >700*  BUN 57* 65*  CREATININE 10.32* 10.60*  CALCIUM 9.4  --    Recent Labs  Lab 09/24/17 1709 09/24/17 1822  WBC 9.7  --   NEUTROABS 8.0*  --   HGB 14.5 16.0  HCT 45.2 47.0  MCV 95.2  --   PLT 148*  --    Liver Function Tests: No results for input(s): AST, ALT, ALKPHOS, BILITOT, PROT, ALBUMIN in the last 168 hours. No results for input(s): LIPASE, AMYLASE in the last 168 hours. No results for input(s): AMMONIA in the last 168 hours. Cardiac Enzymes: No results for input(s): CKTOTAL, CKMB, CKMBINDEX, TROPONINI in the last 168 hours. Iron Studies: No results for input(s): IRON, TIBC, TRANSFERRIN, FERRITIN in the last 72 hours. PT/INR: '@LABRCNTIP' (inr:5)  Xrays/Other Studies: ) Results for orders placed or performed during the hospital encounter of 09/24/17 (from the past 48 hour(s))  Basic metabolic panel     Status: Abnormal   Collection Time: 09/24/17  5:09 PM  Result Value Ref Range   Sodium 121 (L) 135 - 145 mmol/L   Potassium 7.3 (HH) 3.5 - 5.1 mmol/L     Comment: CRITICAL RESULT CALLED TO, READ BACK BY AND VERIFIED WITH: CARDWELL,L AT 1810 ON 09/24/2017 BY MOSLEY,J    Chloride 72 (L) 98 - 111 mmol/L   CO2 19 (L) 22 - 32 mmol/L   Glucose, Bld 893 (HH) 70 - 99 mg/dL    Comment: CRITICAL RESULT CALLED TO, READ BACK BY AND VERIFIED WITH: CARDWELL, L AT 1810 ON 09/24/2017  BY MOSLEY,J    BUN 57 (H) 6 - 20 mg/dL   Creatinine, Ser 10.32 (H) 0.61 - 1.24 mg/dL   Calcium 9.4 8.9 - 10.3 mg/dL   GFR calc non Af Amer 5 (L) >60 mL/min   GFR calc Af Amer 6 (L) >60 mL/min    Comment: (NOTE) The eGFR has been calculated using the CKD EPI equation. This calculation has not been validated in all clinical situations. eGFR's persistently <60 mL/min signify possible Chronic Kidney Disease.    Anion gap 30 (H) 5 - 15    Comment: Performed at [facility], [address] [place], [place] [address]  CBC with Differential     Status: Abnormal   Collection Time: 09/24/17  5:09 PM  Result Value Ref Range   WBC 9.7 4.0 - 10.5 K/uL   RBC 4.75 4.22 - 5.81 MIL/uL   Hemoglobin 14.5 13.0 - 17.0 g/dL   HCT 45.2 39.0 - 52.0 %   MCV 95.2 78.0 - 100.0 fL   MCH 30.5 26.0 - 34.0 pg   MCHC 32.1 30.0 - 36.0 g/dL   RDW 17.2 (H) 11.5 - 15.5 %   Platelets 148 (L) 150 - 400 K/uL   Neutrophils Relative % 83 %   Lymphocytes Relative 11 %   Monocytes Relative 6 %   Eosinophils Relative 0 %   Basophils Relative 0 %   Neutro Abs 8.0 (H) 1.7 - 7.7 K/uL   Lymphs Abs 1.1 0.7 - 4.0 K/uL   Monocytes Absolute 0.6 0.1 - 1.0 K/uL   Eosinophils Absolute 0.0 0.0 - 0.7 K/uL   Basophils Absolute 0.0 0.0 - 0.1 K/uL   WBC Morphology WHITE COUNT CONFIRMED ON SMEAR     Comment: Performed at [facility], [address] [place], [place] [address]  CBG monitoring, ED     Status: Abnormal   Collection Time: 09/24/17  5:16 PM  Result Value Ref Range   Glucose-Capillary >600 (HH) 70 - 99 mg/dL  I-stat troponin, ED     Status: Abnormal   Collection Time: 09/24/17  5:27 PM  Result Value Ref  Range   Troponin i, poc 0.16 (HH) 0.00 - 0.08 ng/mL   Comment 3            Comment: Due to the release kinetics of cTnI, a negative result within the first hours of the onset of symptoms does not rule out myocardial infarction with certainty. If myocardial infarction is still suspected, repeat the test at appropriate intervals.   CBG monitoring, ED     Status: Abnormal   Collection Time: 09/24/17  5:56 PM  Result Value Ref Range   Glucose-Capillary >600 (HH) 70 - 99 mg/dL  Blood gas, arterial     Status: Abnormal   Collection Time: 09/24/17  6:10 PM  Result Value Ref Range   FIO2 100.00    Delivery systems MANUAL VENTILATION    pH, Arterial 7.225 (L) 7.350 - 7.450   pCO2 arterial 40.0 32.0 - 48.0 mmHg   pO2, Arterial 434 (H) 83.0 - 108.0 mmHg   Bicarbonate 16.0 (L) 20.0 - 28.0 mmol/L   Acid-base deficit 10.2 (H) 0.0 - 2.0 mmol/L   O2 Saturation 99.5 %   Patient temperature 37.0    Nitric Oxide LEFT RADIAL    Collection site ARTERIAL DRAW    Drawn by [facility] [place] RRT    Sample type ARTERIAL DRAW    Allens test (pass/fail) PASS PASS    Comment: Performed at [facility]  [facility] [address] [place], [place] [address]  I-stat chem 8, ed     Status: Abnormal   Collection Time: 09/24/17  6:22 PM  Result Value Ref Range   Sodium 120 (L) 135 - 145 mmol/L   Potassium 7.0 (HH) 3.5 - 5.1 mmol/L   Chloride 83 (L) 98 - 111 mmol/L   BUN 65 (H) 6 - 20 mg/dL   Creatinine, Ser 10.60 (H) 0.61 - 1.24 mg/dL   Glucose, Bld >700 (HH) 70 - 99 mg/dL   Calcium, Ion 1.05 (L) 1.15 - 1.40 mmol/L   TCO2 18 (L) 22 - 32 mmol/L   Hemoglobin 16.0 13.0 - 17.0 g/dL   HCT 47.0 39.0 - 52.0 %  APTT     Status: None   Collection Time: 09/24/17  6:47 PM  Result Value Ref Range   aPTT 28 24 - 36 seconds    Comment: Performed at [facility], [address], [place], [place] [address]  Protime-INR     Status: None   Collection Time: 09/24/17  6:47 PM  Result Value Ref Range   Prothrombin Time 13.5 11.4 - 15.2  seconds   INR 1.04     Comment: Performed at [facility], [address], [place], [place] [address]  CBG monitoring, ED     Status: Abnormal   Collection Time: 09/24/17  7:33 PM  Result Value Ref Range   Glucose-Capillary >600 (HH) 70 - 99 mg/dL  Glucose, capillary     Status: Abnormal   Collection Time: 09/24/17  8:56 PM  Result Value Ref Range   Glucose-Capillary >600 (HH) 70 - 99 mg/dL   Comment 1 Capillary Specimen    Dg Chest Port 1 View  Result Date: 09/24/2017 CLINICAL DATA:  Initial evaluation for acute chest pain, status post CPR. EXAM: PORTABLE CHEST 1 VIEW COMPARISON:  Prior radiograph from 08/07/2017 FINDINGS: Endotracheal tube in place with tip positioned 3.9 cm above the carina. Enteric tube courses into the abdomen. Defibrillator pads overlie the left chest. Moderate cardiomegaly, stable.  Mediastinal silhouette normal. Lungs mildly hypoinflated. Diffuse vascular and interstitial prominence consistent with mild diffuse pulmonary interstitial edema. No focal infiltrates. No pleural effusion. No pneumothorax. No acute osseous abnormality. Vascular stent overlies the distal right subclavian region. IMPRESSION: 1. Support apparatus in satisfactory position as above. 2. Cardiomegaly with mild diffuse pulmonary interstitial edema. Electronically Signed   By: Jeannine Boga M.D.   On: 09/24/2017 18:44    PMH:   Past Medical History:  Diagnosis Date  . Aortic stenosis    Very mild in 05/2007  . Arthritis    "right shoulder; right knee; feel like I've got it all over" (11/28/2015)  . CHF (congestive heart failure) ([place])   . Degenerative joint disease    Left TKA  . Diastolic heart failure (HCC)    LVEF 65-70% with grade 2 diastolic dysfunction  . ESRD (end stage renal disease) on dialysis ([place])    "MWF; [facility] on [address]" (11/28/2015)  . Essential hypertension, benign 2000   LVH  . GERD (gastroesophageal reflux disease)   . HCAP (healthcare-associated pneumonia)  11/28/2015  . Hyperlipidemia 2000  . Loculated pleural effusion 11/28/2015   [name] 11/28/2015  . Pneumonia 12/2013; 05/2014  . PSVT (paroxysmal supraventricular tachycardia) (HCC)    Nonsustained; asymptomatic; diagnosed by event recorder in 2006  . Tobacco abuse, in remission    20 pack years; discontinued in 1985; bronchitic changes on chest x-ray and 2009  . Type 1 diabetes  mellitus ([place]) 1990    PSH:   Past Surgical History:  Procedure Laterality Date  . AMPUTATION Right 06/06/2017   Procedure: RIGHT BELOW KNEE AMPUTATION;  Surgeon: [name], MD;  Location: [place];  Service: Orthopedics;  Laterality: Right;  . APPLICATION OF WOUND VAC Right 07/25/2017   Procedure: APPLICATION OF WOUND VAC;  Surgeon: [name], MD;  Location: [place];  Service: Orthopedics;  Laterality: Right;  . AV FISTULA PLACEMENT Right 06/23/2014   Procedure: ARTERIOVENOUS (AV) FISTULA CREATION;  Surgeon: Angelia Mould, MD;  Location: [place];  Service: Vascular;  Laterality: Right;  . CARDIAC CATHETERIZATION     [facility]- evaluation for Kidney/ pancreas transplant  . EYE SURGERY Bilateral    "for glaucoma"  . INGUINAL HERNIA REPAIR Right    As a child  . LOWER EXTREMITY ANGIOGRAPHY N/A 05/27/2017   Procedure: LOWER EXTREMITY ANGIOGRAPHY;  Surgeon: [name], MD;  Location: [place] [facility];  Service: Cardiovascular;  Laterality: N/A;  . STUMP REVISION Right 07/25/2017   Procedure: REVISION RIGHT BELOW KNEE AMPUTATION;;  Surgeon: [name], MD;  Location: [place];  Service: Orthopedics;  Laterality: Right;  . WISDOM TOOTH EXTRACTION  1987    Allergies:  Allergies  Allergen Reactions  . Atorvastatin Other (See Comments)    Muscle pain   . Minoxidil Other (See Comments)    Fluid retention   . Adhesive [Tape] Other (See Comments)    MUST USE "SILK TAPE," AS ANY OTHER "BURNS" THE SKIN  . Statins Other (See Comments)    Muscle pain    Medications:   Prior to Admission medications    Medication Sig Start Date End Date Taking? Authorizing Provider  acetaminophen (TYLENOL) 500 MG tablet Take 1,000 mg by mouth every 6 (six) hours as needed (for pain).    [provider]  aspirin EC 325 MG EC tablet Take 1 tablet (325 mg total) by mouth daily. 06/18/17   Angiulli, Lavon Paganini, PA-C  calcitRIOL (ROCALTROL) 0.25 MCG capsule Take 1 capsule (0.25 mcg total) by mouth every Monday, Wednesday, and Friday with hemodialysis. Patient taking differently: Take 0.25 mcg by mouth See admin instructions. Take 0.25 mcg by mouth once a day on Mon/Tues/Thurs/Fri with home dialysis 06/18/17   Angiulli, Lavon Paganini, PA-C  calcium acetate (PHOSLO) 667 MG capsule Take 1 capsule (667 mg total) by mouth 3 (three) times daily. Patient taking differently: Take 667 mg by mouth 3 (three) times daily with meals.  06/18/17   Angiulli, Lavon Paganini, PA-C  carvedilol (COREG) 3.125 MG tablet Take 1 tablet (3.125 mg total) by mouth 2 (two) times daily with a meal. Patient taking differently: Take 6.25 mg by mouth 2 (two) times daily with a meal.  06/18/17   Angiulli, Lavon Paganini, PA-C  Continuous Blood Gluc Receiver (FREESTYLE LIBRE 14 DAY READER) DEVI 1 each by Does not apply route every 14 (fourteen) days. 07/29/17   Cassandria Anger, MD  Continuous Blood Gluc Sensor (FREESTYLE LIBRE 14 DAY SENSOR) MISC 1 each by Does not apply route every 14 (fourteen) days. 07/21/17   Cassandria Anger, MD  Darbepoetin Alfa (ARANESP) 60 MCG/0.3ML SOSY injection Inject 0.3 mLs (60 mcg total) into the vein every Monday with hemodialysis. 06/23/17   Angiulli, Lavon Paganini, PA-C  docusate sodium (COLACE) 100 MG capsule Take 1 capsule (100 mg total) by mouth 2 (two) times daily. Patient taking differently: Take 200 mg by mouth See admin instructions. Take 200 mg by  mouth once a day when taking pain meds 06/18/17   Angiulli, Lavon Paganini, PA-C  gabapentin (NEURONTIN) 100 MG capsule Take 1 capsule (100 mg total) by mouth daily. Patient taking  differently: Take 100 mg by mouth See admin instructions. Take 100 mg by mouth once a day on Sun/Wed/Sat 06/18/17   Angiulli, Lavon Paganini, PA-C  gabapentin (NEURONTIN) 300 MG capsule Take 1 capsule (300 mg total) by mouth every Monday, Wednesday, and Friday with hemodialysis. Patient taking differently: Take 300 mg by mouth See admin instructions. Take 300 mg by mouth once a day on Mon/Tues/Thurs/Fri 06/18/17   Angiulli, Lavon Paganini, PA-C  insulin glargine (LANTUS) 100 unit/mL SOPN Inject 0.25 mLs (25 Units total) into the skin at bedtime. 07/01/17   Cassandria Anger, MD  Insulin Lispro (HUMALOG KWIKPEN [place]) Inject 15-20 Units into the skin 3 (three) times daily before meals.     [provider]  methocarbamol (ROBAXIN) 500 MG tablet Take 1 tablet (500 mg total) by mouth every 6 (six) hours as needed for muscle spasms. 08/08/17   Bonnielee Haff, MD  ondansetron (ZOFRAN ODT) 4 MG disintegrating tablet Take 1 tablet (4 mg total) by mouth every 8 (eight) hours as needed for nausea or vomiting. 07/01/17   [name], MD  oxyCODONE-acetaminophen (PERCOCET/ROXICET) 5-325 MG tablet Take 1 tablet by mouth every 6 (six) hours as needed for severe pain. 08/08/17   Bonnielee Haff, MD  pantoprazole (PROTONIX) 40 MG tablet Take 1 tablet (40 mg total) by mouth daily. 08/08/17 09/07/17  Bonnielee Haff, MD  pantoprazole (PROTONIX) 40 MG tablet TAKE 1 TABLET BY MOUTH EVERY DAY 09/08/17   [name], MD  pentoxifylline (TRENTAL) 400 MG CR tablet Take 1 tablet (400 mg total) by mouth 3 (three) times daily with meals. 06/26/17   [name], MD  polyethylene glycol [facility]) packet Take 17 g by mouth daily. Patient taking differently: Take 17 g by mouth See admin instructions. Take 17 grams by mouth, mixed, once a day only when taking pain meds 07/03/17   Jamse Arn, MD  sucroferric oxyhydroxide (VELPHORO) 500 MG chewable tablet Chew 3 tablets (1,500 mg total) by mouth 3 (three) times daily. 06/18/17    Angiulli, Lavon Paganini, PA-C    Discontinued Meds:   Medications Discontinued During This Encounter  Medication Reason  . calcium chloride 10 % injection Returned to ADS  . propofol (DIPRIVAN) 1000 MG/100ML infusion   . ondansetron (ZOFRAN) injection 4 mg   . morphine 4 MG/ML injection 4 mg   . propofol (DIPRIVAN) 1000 MG/100ML infusion     Social History:  reports that he has never smoked. He has never used smokeless tobacco. He reports that he drinks alcohol. He reports that he does not use drugs.  Family History:   Family History  Problem Relation Age of Onset  . Diabetes Father   . Hypertension Father   . Diabetes Mother   . Hypertension Mother     Blood pressure (!) 108/55, pulse (!) 105, temperature (!) 94.3 F (34.6 C), temperature source Other (Comment), resp. rate (!) 26, height 6' 0.44" (1.84 m), weight 96.8 kg, SpO2 100 %. General appearance: alert, cooperative and appears stated age Head: Normocephalic, without obvious abnormality, atraumatic Eyes: negative Neck: no adenopathy, no carotid bruit, supple, symmetrical, trachea midline and thyroid not enlarged, symmetric, no tenderness/mass/nodules Back: symmetric, no curvature. ROM normal. No CVA tenderness. Resp: clear to auscultation bilaterally Chest wall: no tenderness Cardio: Tachy GI: soft, non-tender;  bowel sounds normal; no masses,  no organomegaly Extremities: edema tr, rt BKA Pulses: 2+ and symmetric Skin: Skin color, texture, turgor normal. No rashes or lesions Lymph nodes: Cervical, supraclavicular, and axillary nodes normal. Neurologic: Grossly normal       Nakaila Freeze, Hunt Oris, MD 09/24/2017, 9:21 PM

## 2017-09-24 NOTE — ED Notes (Signed)
[facility] arrival before 2nd  Troponin drawn.

## 2017-09-24 NOTE — ED Provider Notes (Addendum)
[facility] EMERGENCY DEPARTMENT Provider Note   CSN: [id number] Arrival date & time: 09/24/17  1659     History   Chief Complaint Chief Complaint  Patient presents with  . Chest Pain    HPI Lance [name] is a 47 y.o. male.  Level 5 caveat for acuity of condition and urgent need for intervention.  Patient is a long-standing diabetic on home dialysis.  Chief complaint nausea and vomiting for 24 hours with associated chest pain.  Past medical history includes congestive heart failure, ESRD, hypertension, HCAP.  Soon after I did my original H&P, the patient became pulseless and apneic.  Cardiac resuscitation was initiated.     Past Medical History:  Diagnosis Date  . Aortic stenosis    Very mild in 05/2007  . Arthritis    "right shoulder; right knee; feel like I've got it all over" (11/28/2015)  . CHF (congestive heart failure) ([place])   . Degenerative joint disease    Left TKA  . Diastolic heart failure (HCC)    LVEF 65-70% with grade 2 diastolic dysfunction  . ESRD (end stage renal disease) on dialysis ([place])    "MWF; [facility] on [address]" (11/28/2015)  . Essential hypertension, benign 2000   LVH  . GERD (gastroesophageal reflux disease)   . HCAP (healthcare-associated pneumonia) 11/28/2015  . Hyperlipidemia 2000  . Loculated pleural effusion 11/28/2015   [name] 11/28/2015  . Pneumonia 12/2013; 05/2014  . PSVT (paroxysmal supraventricular tachycardia) (HCC)    Nonsustained; asymptomatic; diagnosed by event recorder in 2006  . Tobacco abuse, in remission    20 pack years; discontinued in 1985; bronchitic changes on chest x-ray and 2009  . Type 1 diabetes mellitus ([place]) 1990    Patient Active Problem List   Diagnosis Date Noted  . Cardiac arrest ([place]) 09/24/2017  . Acute respiratory failure with hypoxia ([place]) 08/07/2017  . Respiratory distress 08/07/2017  . Dehiscence of amputation stump ([place]) 07/17/2017  . Benign essential HTN   . Chronic diastolic  (congestive) heart failure ([place])   . Labile blood glucose   . Labile blood pressure   . Vomiting   . Phantom limb pain ([place])   . Poorly controlled type 2 diabetes mellitus with peripheral neuropathy ([place])   . Unilateral complete BKA, right, sequela ([place])   . Amputation of right lower extremity below knee ([place]) 06/10/2017  . ESRD on dialysis ([place])   . Chronic diastolic congestive heart failure ([place])   . History of PSVT (paroxysmal supraventricular tachycardia)   . Diabetes mellitus type 2 in nonobese (HCC)   . Post-operative pain   . Acute blood loss anemia   . Anemia of chronic disease   . Leukocytosis   . Tachycardia   . History of right below knee amputation ([place]) 06/06/2017  . PVD (peripheral vascular disease) ([place]) 05/13/2017  . Bilateral pleural effusion 11/28/2015  . Loculated pleural effusion 11/28/2015  . HCAP (healthcare-associated pneumonia) 11/28/2015  . ESRD (end stage renal disease) on dialysis ([place]) 11/12/2015  . Hyperkalemia 11/12/2015  . Shoulder pain, acute 11/12/2015  . Nausea & vomiting 11/12/2015  . Diarrhea 11/12/2015  . Hypertensive urgency 05/05/2014  . Nephrotic syndrome 12/29/2013  . Hyponatremia 12/28/2013  . Acute diastolic heart failure ([place]) 12/28/2013  . Hypoglycemia 12/28/2013  . Dyspnea   . Acute renal failure syndrome ([place])   . Diabetic ketoacidosis without coma associated with type 1 diabetes mellitus ([place])   . Demand ischemia ([place]) 12/24/2013  . DKA (diabetic ketoacidoses) ([place]) 12/23/2013  .  CAP (community acquired pneumonia) 12/23/2013  . Sepsis due to pneumonia ([place]) 12/23/2013  . Metabolic acidemia 50/35/4656  . Acute renal failure ([place]) 12/23/2013  . Diabetic ketoacidosis ([place]) 12/23/2013  . Chronic diastolic heart failure ([place]) 04/14/2013  . PSVT (paroxysmal supraventricular tachycardia) ([place]) 04/14/2013  . Bilateral leg edema 03/26/2013  . Chronic kidney disease, stage [name] (moderate) ([place]) 09/29/2011  . Type 1 diabetes mellitus  with end-stage renal disease ([place]) 09/26/2010  . Mixed hyperlipidemia 09/26/2010    Past Surgical History:  Procedure Laterality Date  . AMPUTATION Right 06/06/2017   Procedure: RIGHT BELOW KNEE AMPUTATION;  Surgeon: [name], MD;  Location: [place];  Service: Orthopedics;  Laterality: Right;  . APPLICATION OF WOUND VAC Right 07/25/2017   Procedure: APPLICATION OF WOUND VAC;  Surgeon: [name], MD;  Location: [place];  Service: Orthopedics;  Laterality: Right;  . AV FISTULA PLACEMENT Right 06/23/2014   Procedure: ARTERIOVENOUS (AV) FISTULA CREATION;  Surgeon: Angelia Mould, MD;  Location: [place];  Service: Vascular;  Laterality: Right;  . CARDIAC CATHETERIZATION     [facility]- evaluation for Kidney/ pancreas transplant  . EYE SURGERY Bilateral    "for glaucoma"  . INGUINAL HERNIA REPAIR Right    As a child  . LOWER EXTREMITY ANGIOGRAPHY N/A 05/27/2017   Procedure: LOWER EXTREMITY ANGIOGRAPHY;  Surgeon: [name], MD;  Location: [place] CV LAB;  Service: Cardiovascular;  Laterality: N/A;  . STUMP REVISION Right 07/25/2017   Procedure: REVISION RIGHT BELOW KNEE AMPUTATION;;  Surgeon: [name], MD;  Location: [place];  Service: Orthopedics;  Laterality: Right;  . [place] Medications    Prior to Admission medications   Medication Sig Start Date End Date Taking? Authorizing Provider  acetaminophen (TYLENOL) 500 MG tablet Take 1,000 mg by mouth every 6 (six) hours as needed (for pain).    [provider]  aspirin EC 325 MG EC tablet Take 1 tablet (325 mg total) by mouth daily. 06/18/17   Angiulli, Lavon Paganini, PA-C  calcitRIOL (ROCALTROL) 0.25 MCG capsule Take 1 capsule (0.25 mcg total) by mouth every Monday, Wednesday, and Friday with hemodialysis. Patient taking differently: Take 0.25 mcg by mouth See admin instructions. Take 0.25 mcg by mouth once a day on Mon/Tues/Thurs/Fri with home dialysis 06/18/17   Angiulli, Lavon Paganini, PA-C    calcium acetate (PHOSLO) 667 MG capsule Take 1 capsule (667 mg total) by mouth 3 (three) times daily. Patient taking differently: Take 667 mg by mouth 3 (three) times daily with meals.  06/18/17   Angiulli, Lavon Paganini, PA-C  carvedilol (COREG) 3.125 MG tablet Take 1 tablet (3.125 mg total) by mouth 2 (two) times daily with a meal. Patient taking differently: Take 6.25 mg by mouth 2 (two) times daily with a meal.  06/18/17   Angiulli, Lavon Paganini, PA-C  Continuous Blood Gluc Receiver (FREESTYLE LIBRE 14 DAY READER) DEVI 1 each by Does not apply route every 14 (fourteen) days. 07/29/17   Cassandria Anger, MD  Continuous Blood Gluc Sensor (FREESTYLE LIBRE 14 DAY SENSOR) MISC 1 each by Does not apply route every 14 (fourteen) days. 07/21/17   Cassandria Anger, MD  Darbepoetin Alfa (ARANESP) 60 MCG/0.3ML SOSY injection Inject 0.3 mLs (60 mcg total) into the vein every Monday with hemodialysis. 06/23/17   Angiulli, Lavon Paganini, PA-C  docusate sodium (COLACE) 100 MG capsule Take 1 capsule (100 mg total) by mouth 2 (  two) times daily. Patient taking differently: Take 200 mg by mouth See admin instructions. Take 200 mg by mouth once a day when taking pain meds 06/18/17   Angiulli, Lavon Paganini, PA-C  gabapentin (NEURONTIN) 100 MG capsule Take 1 capsule (100 mg total) by mouth daily. Patient taking differently: Take 100 mg by mouth See admin instructions. Take 100 mg by mouth once a day on Sun/Wed/Sat 06/18/17   Angiulli, Lavon Paganini, PA-C  gabapentin (NEURONTIN) 300 MG capsule Take 1 capsule (300 mg total) by mouth every Monday, Wednesday, and Friday with hemodialysis. Patient taking differently: Take 300 mg by mouth See admin instructions. Take 300 mg by mouth once a day on Mon/Tues/Thurs/Fri 06/18/17   Angiulli, Lavon Paganini, PA-C  insulin glargine (LANTUS) 100 unit/mL SOPN Inject 0.25 mLs (25 Units total) into the skin at bedtime. 07/01/17   Cassandria Anger, MD  Insulin Lispro (HUMALOG KWIKPEN [place]) Inject 15-20 Units into  the skin 3 (three) times daily before meals.     [provider]  methocarbamol (ROBAXIN) 500 MG tablet Take 1 tablet (500 mg total) by mouth every 6 (six) hours as needed for muscle spasms. 08/08/17   Bonnielee Haff, MD  ondansetron (ZOFRAN ODT) 4 MG disintegrating tablet Take 1 tablet (4 mg total) by mouth every 8 (eight) hours as needed for nausea or vomiting. 07/01/17   [name], MD  oxyCODONE-acetaminophen (PERCOCET/ROXICET) 5-325 MG tablet Take 1 tablet by mouth every 6 (six) hours as needed for severe pain. 08/08/17   Bonnielee Haff, MD  pantoprazole (PROTONIX) 40 MG tablet Take 1 tablet (40 mg total) by mouth daily. 08/08/17 09/07/17  Bonnielee Haff, MD  pantoprazole (PROTONIX) 40 MG tablet TAKE 1 TABLET BY MOUTH EVERY DAY 09/08/17   [name], MD  pentoxifylline (TRENTAL) 400 MG CR tablet Take 1 tablet (400 mg total) by mouth 3 (three) times daily with meals. 06/26/17   [name], MD  polyethylene glycol [facility]) packet Take 17 g by mouth daily. Patient taking differently: Take 17 g by mouth See admin instructions. Take 17 grams by mouth, mixed, once a day only when taking pain meds 07/03/17   Jamse Arn, MD  sucroferric oxyhydroxide (VELPHORO) 500 MG chewable tablet Chew 3 tablets (1,500 mg total) by mouth 3 (three) times daily. 06/18/17   Angiulli, Lavon Paganini, PA-C    Family History Family History  Problem Relation Age of Onset  . Diabetes Father   . Hypertension Father   . Diabetes Mother   . Hypertension Mother     Social History Social History   Tobacco Use  . Smoking status: Never Smoker  . Smokeless tobacco: Never Used  Substance Use Topics  . Alcohol use: Yes    Comment: 11/28/2015 "used to drink; stopped ~ 2 yr ago"  . Drug use: No     Allergies   Atorvastatin; Minoxidil; Adhesive [tape]; and Statins   Review of Systems Review of Systems  Unable to perform ROS: Acuity of condition     Physical Exam Updated Vital Signs BP  140/74   Pulse 80   Temp (!) 94.3 F (34.6 C) (Other (Comment)) Comment (Src): temp foley  Resp 16   SpO2 100%   Physical Exam  Constitutional: He is oriented to person, place, and time. He appears well-developed and well-nourished.  HENT:  Head: Normocephalic and atraumatic.  Eyes: Conjunctivae are normal.  Neck: Neck supple.  Cardiovascular: Normal rate and regular rhythm.  Pulmonary/Chest: Effort normal and breath  sounds normal.  Abdominal: Soft. Bowel sounds are normal.  Musculoskeletal:  Left AKA  Neurological: He is alert and oriented to person, place, and time.  Skin: Skin is warm and dry.  Psychiatric: He has a normal mood and affect. His behavior is normal.  Nursing note and vitals reviewed.    ED Treatments / Results  Labs (all labs ordered are listed, but only abnormal results are displayed) Labs Reviewed  BASIC METABOLIC PANEL - Abnormal; Notable for the following components:      Result Value   Sodium 121 (*)    Potassium 7.3 (*)    Chloride 72 (*)    CO2 19 (*)    Glucose, Bld 893 (*)    BUN 57 (*)    Creatinine, Ser 10.32 (*)    GFR calc non Af Amer 5 (*)    GFR calc Af Amer 6 (*)    Anion gap 30 (*)    All other components within normal limits  CBC WITH DIFFERENTIAL/PLATELET - Abnormal; Notable for the following components:   RDW 17.2 (*)    Platelets 148 (*)    Neutro Abs 8.0 (*)    All other components within normal limits  BLOOD GAS, ARTERIAL - Abnormal; Notable for the following components:   pH, Arterial 7.225 (*)    pO2, Arterial 434 (*)    Bicarbonate 16.0 (*)    Acid-base deficit 10.2 (*)    All other components within normal limits  I-STAT TROPONIN, ED - Abnormal; Notable for the following components:   Troponin i, poc 0.16 (*)    All other components within normal limits  CBG MONITORING, ED - Abnormal; Notable for the following components:   Glucose-Capillary >600 (*)    All other components within normal limits  CBG MONITORING, ED  - Abnormal; Notable for the following components:   Glucose-Capillary >600 (*)    All other components within normal limits  CBG MONITORING, ED - Abnormal; Notable for the following components:   Glucose-Capillary >600 (*)    All other components within normal limits  I-STAT CHEM 8, ED - Abnormal; Notable for the following components:   Sodium 120 (*)    Potassium 7.0 (*)    Chloride 83 (*)    BUN 65 (*)    Creatinine, Ser 10.60 (*)    Glucose, Bld >700 (*)    Calcium, Ion 1.05 (*)    TCO2 18 (*)    All other components within normal limits  APTT  PROTIME-INR  I-STAT CG4 LACTIC ACID, ED  I-STAT TROPONIN, ED    EKG EKG Interpretation  Date/Time:  Wednesday September 24 2017 17:01:19 EDT Ventricular Rate:  107 PR Interval:  152 QRS Duration: 134 QT Interval:  378 QTC Calculation: 504 R Axis:   116 Text Interpretation:  Sinus tachycardia Non-specific intra-ventricular conduction block Cannot rule out Anterior infarct , age undetermined Marked T wave abnormality, consider inferior ischemia Abnormal ECG Confirmed by Nat Christen [phone]) on 09/24/2017 6:29:08 PM   Radiology Dg Chest Port 1 View  Result Date: 09/24/2017 CLINICAL DATA:  Initial evaluation for acute chest pain, status post CPR. EXAM: PORTABLE CHEST 1 VIEW COMPARISON:  Prior radiograph from 08/07/2017 FINDINGS: Endotracheal tube in place with tip positioned 3.9 cm above the carina. Enteric tube courses into the abdomen. Defibrillator pads overlie the left chest. Moderate cardiomegaly, stable.  Mediastinal silhouette normal. Lungs mildly hypoinflated. Diffuse vascular and interstitial prominence consistent with mild diffuse pulmonary interstitial edema. No focal infiltrates.  No pleural effusion. No pneumothorax. No acute osseous abnormality. Vascular stent overlies the distal right subclavian region. IMPRESSION: 1. Support apparatus in satisfactory position as above. 2. Cardiomegaly with mild diffuse pulmonary interstitial  edema. Electronically Signed   By: Jeannine Boga M.D.   On: 09/24/2017 18:44    Procedures Procedure Name: Intubation Date/Time: 09/24/2017 4:15 PM Performed by: Nat Christen, MD Pre-anesthesia Checklist: Patient identified, Emergency Drugs available, Suction available and Patient being monitored Oxygen Delivery Method: Non-rebreather mask Preoxygenation: Pre-oxygenation with 100% oxygen Induction Type: Rapid sequence Ventilation: Mask ventilation without difficulty Laryngoscope Size: Glidescope Grade View: Grade II Tube size: 7.5 mm Number of attempts: 2 Airway Equipment and Method: Stylet,  Lighted stylet and Video-laryngoscopy Placement Confirmation: ETT inserted through vocal cords under direct vision,  Positive ETCO2 and Breath sounds checked- equal and bilateral Tube secured with: ETT holder Comments: Patient intubated with glide scope with minimal complications.      (including critical care time)  Medications Ordered in ED Medications  ondansetron (ZOFRAN) injection 4 mg (4 mg Intravenous Not Given 09/24/17 1831)  morphine 4 MG/ML injection 4 mg (4 mg Intravenous Not Given 09/24/17 1831)  insulin regular (NOVOLIN R,HUMULIN R) 100 Units in sodium chloride 0.9 % 100 mL (1 Units/mL) infusion (20 Units/hr Intravenous New Bag/Given 09/24/17 1806)  aspirin suppository 300 mg (300 mg Rectal Not Given 09/24/17 1843)  albuterol (PROVENTIL) (2.5 MG/3ML) 0.083% nebulizer solution (  Not Given 09/24/17 1917)  sodium chloride 0.9 % bolus 500 mL (0 mLs Intravenous Stopped 09/24/17 1832)  insulin aspart (novoLOG) injection 10 Units (10 Units Intravenous Given 09/24/17 1745)  EPINEPHrine (ADRENALIN) 1 MG/10ML injection (1 mg Intravenous Given 09/24/17 1740)  calcium chloride injection ( Intravenous Canceled Entry 09/24/17 1900)  sodium bicarbonate injection ( Intravenous Canceled Entry 09/24/17 1900)  amiodarone (CORDARONE) 300 mg in dextrose 5 % 100 mL bolus (300 mg Intravenous New  Bag/Given 09/24/17 1744)  lidocaine (cardiac) 100 mg/75mL (XYLOCAINE) injection 2% (100 mg Intravenous Given 09/24/17 1742)  insulin aspart (novoLOG) injection 10 Units (10 Units Intravenous Given 09/24/17 1808)  etomidate (AMIDATE) injection (10 mg Intravenous Given 09/24/17 1751)  rocuronium (ZEMURON) injection (100 mg Intravenous Given 09/24/17 1752)  insulin aspart (novoLOG) injection 7 Units (7 Units Intravenous Given 09/24/17 1804)  sodium chloride 0.9 % bolus 500 mL (0 mLs Intravenous Stopped 09/24/17 1924)  dextrose 50 % solution (  Given 09/24/17 1900)  albuterol (PROVENTIL) (2.5 MG/3ML) 0.083% nebulizer solution 10 mg (10 mg Nebulization Given by Other 09/24/17 1857)  propofol (DIPRIVAN) 1000 MG/100ML infusion (5 mcg/kg/min Intravenous New Bag/Given 09/24/17 1915)  midazolam (VERSED) 5 MG/5ML injection 4 mg (4 mg Intravenous Given 09/24/17 1946)     Initial Impression / Assessment and Plan / ED Course  I have reviewed the triage vital signs and the nursing notes.  Pertinent labs & imaging results that were available during my care of the patient were reviewed by me and considered in my medical decision making (see chart for details).     Patient presented with nausea, vomiting, chest pain followed by cardiac arrest.  I suspect he arrested secondary to hyperkalemia.  Aggressive measures to treat his elevated K were initiated including calcium, bicarbonate, insulin and glucose.  IV insulin bolus and insulin drip was initiated.  Patient was intubated with a 7.5 ET tube without difficulty via the glide scope.  Multiple shocks were initiated for an initial agonal rhythm.  Other modalities administered include epinephrine, amiodarone, IV fluids.  I attempted to maintain ACLS  protocol.  Pulses were ultimately restored and patient appeared to be in normal sinus rhythm.  His blood pressure was stable.  I consulted with critical care specialist Dr. Pearline Cables and cardiologist Dr. Einar Gip.  Cooling protocol  initiated.  Transfer to [employer].  Discussed with wife and parents.   1940: Potassium still elevated.  Will increase rate of insulin drip, repeat bicarb bolus and calcium bolus.  Vital signs are stable.  Propofol drip initiated for sedation. CRITICAL CARE Performed by: Nat Christen Total critical care time: 120 minutes Critical care time was exclusive of separately billable procedures and treating other patients. Critical care was necessary to treat or prevent imminent or life-threatening deterioration. Critical care was time spent personally by me on the following activities: development of treatment plan with patient and/or surrogate as well as nursing, discussions with consultants, evaluation of patient's response to treatment, examination of patient, obtaining history from patient or surrogate, ordering and performing treatments and interventions, ordering and review of laboratory studies, ordering and review of radiographic studies, pulse oximetry and re-evaluation of patient's condition.  Final Clinical Impressions(s) / ED Diagnoses   Final diagnoses:  Cardiac arrest [facility])  Hyperkalemia  ESRD (end stage renal disease) ([place])  Endotracheally intubated    ED Discharge Orders    None       Nat Christen, MD 09/24/17 Marko Stai    Nat Christen, MD 09/24/17 2019    Nat Christen, MD 09/24/17 2025    Nat Christen, MD 09/24/17 2229    Nat Christen, MD 09/24/17 2206

## 2017-09-24 NOTE — ED Notes (Signed)
Per [facility] put pt on temp foley

## 2017-09-24 NOTE — Code Documentation (Signed)
Size 8cm ett placed by Dr. Lacinda Axon.  Secured at 25cm at lip.  Positive color change on co2 detector.  cbg greater than 600.

## 2017-09-24 NOTE — H&P (Addendum)
PULMONARY / CRITICAL CARE MEDICINE   Name: Lance [name] MRN: [id number] DOB: 18-Oct-1970    ADMISSION DATE:  09/24/2017 CONSULTATION DATE:  09/24/2017  REFERRING MD:  Dr. Lacinda Axon  CHIEF COMPLAINT:  Cardiac arrest   HISTORY OF PRESENT ILLNESS:   HPI obtained from medical chart review and per patient's wife at bedside as patient is intubated on mechanical sedation.  47 year old male with PMH significant for but not limited to ESRD on home four days a week ( M/ T/ TH/ F) HD, DM, HTN, systolic HF, AS, GERD, and PVD s/p right BKA on 6/14 who originally presented to [facility] ER 8/14 with complaints of nausea and chest pain.  Additionally, he is being followed at [facility] for possible kidney/ pancreas transplant.  Wife reports patient completed full treatment of dialysis at home last night and afterwards had unusual hypotension afterwards, as she states he has been actually hypertensive the last several weeks.  He did not eat last night as well.  Reports blood sugars have been without change.  This morning, he woke up with severe nausea and vomiting, and was unable to eat and did not take any of his medications.  Around 3pm, he developed chest pain and presented to the ER.  Pain apparently radiated to his back. EKG showed ST at 128, peaked twaves, some ST depression and QTc 0.504.  He was chemically treated for his hyperkalemia and placed on insulin gtt. While in ER, patient had a Vfib cardiac arrest, defib, and CPR for 9 mins.  Patient was subsequently intubated.  Lab work shows K of 7.3, glucose 893, Na 121, Cl 72, BUN 57, sCr 10.32, troponin 0.16, Plt 148, Hgb 14.5, WBC 9.7.  EKG non-acute.  Patient to be transferred to [facility] for further medical management including renal and cardiology, PCCM to admit.   PAST MEDICAL HISTORY :  He  has a past medical history of Aortic stenosis, Arthritis, CHF (congestive heart failure) ([place]), Degenerative joint disease, Diastolic heart failure ([place]), ESRD (end stage  renal disease) on dialysis [facility]), Essential hypertension, benign (2000), GERD (gastroesophageal reflux disease), HCAP (healthcare-associated pneumonia) (11/28/2015), Hyperlipidemia (2000), Loculated pleural effusion (11/28/2015), Pneumonia (12/2013; 05/2014), PSVT (paroxysmal supraventricular tachycardia) ([place]), Tobacco abuse, in remission, and Type 1 diabetes mellitus ([place]) (1990).  PAST SURGICAL HISTORY: He  has a past surgical history that includes Wisdom tooth extraction (1987); AV fistula placement (Right, 06/23/2014); Inguinal hernia repair (Right); Eye surgery (Bilateral); Lower Extremity Angiography (N/A, 05/27/2017); Cardiac catheterization; Amputation (Right, 06/06/2017); Stump revision (Right, 1/44/3154); and Application if wound vac (Right, 07/25/2017).  Allergies  Allergen Reactions  . Atorvastatin Other (See Comments)    Muscle pain   . Minoxidil Other (See Comments)    Fluid retention   . Adhesive [Tape] Other (See Comments)    MUST USE "SILK TAPE," AS ANY OTHER "BURNS" THE SKIN  . Statins Other (See Comments)    Muscle pain    No current facility-administered medications on file prior to encounter.    Current Outpatient Medications on File Prior to Encounter  Medication Sig  . acetaminophen (TYLENOL) 500 MG tablet Take 1,000 mg by mouth every 6 (six) hours as needed (for pain).  Marland Kitchen aspirin EC 325 MG EC tablet Take 1 tablet (325 mg total) by mouth daily.  . calcitRIOL (ROCALTROL) 0.25 MCG capsule Take 1 capsule (0.25 mcg total) by mouth every Monday, Wednesday, and Friday with hemodialysis. (Patient taking differently: Take 0.25 mcg by mouth See admin instructions. Take 0.25  mcg by mouth once a day on Mon/Tues/Thurs/Fri with home dialysis)  . calcium acetate (PHOSLO) 667 MG capsule Take 1 capsule (667 mg total) by mouth 3 (three) times daily. (Patient taking differently: Take 667 mg by mouth 3 (three) times daily with meals. )  . carvedilol (COREG) 3.125 MG tablet Take 1 tablet  (3.125 mg total) by mouth 2 (two) times daily with a meal. (Patient taking differently: Take 6.25 mg by mouth 2 (two) times daily with a meal. )  . Continuous Blood Gluc Receiver (FREESTYLE LIBRE 14 DAY READER) DEVI 1 each by Does not apply route every 14 (fourteen) days.  . Continuous Blood Gluc Sensor (FREESTYLE LIBRE 14 DAY SENSOR) MISC 1 each by Does not apply route every 14 (fourteen) days.  . Darbepoetin Alfa (ARANESP) 60 MCG/0.3ML SOSY injection Inject 0.3 mLs (60 mcg total) into the vein every Monday with hemodialysis.  Marland Kitchen docusate sodium (COLACE) 100 MG capsule Take 1 capsule (100 mg total) by mouth 2 (two) times daily. (Patient taking differently: Take 200 mg by mouth See admin instructions. Take 200 mg by mouth once a day when taking pain meds)  . gabapentin (NEURONTIN) 100 MG capsule Take 1 capsule (100 mg total) by mouth daily. (Patient taking differently: Take 100 mg by mouth See admin instructions. Take 100 mg by mouth once a day on Sun/Wed/Sat)  . gabapentin (NEURONTIN) 300 MG capsule Take 1 capsule (300 mg total) by mouth every Monday, Wednesday, and Friday with hemodialysis. (Patient taking differently: Take 300 mg by mouth See admin instructions. Take 300 mg by mouth once a day on Mon/Tues/Thurs/Fri)  . insulin glargine (LANTUS) 100 unit/mL SOPN Inject 0.25 mLs (25 Units total) into the skin at bedtime.  . Insulin Lispro (HUMALOG KWIKPEN [place]) Inject 15-20 Units into the skin 3 (three) times daily before meals.   . methocarbamol (ROBAXIN) 500 MG tablet Take 1 tablet (500 mg total) by mouth every 6 (six) hours as needed for muscle spasms.  . ondansetron (ZOFRAN ODT) 4 MG disintegrating tablet Take 1 tablet (4 mg total) by mouth every 8 (eight) hours as needed for nausea or vomiting.  Marland Kitchen oxyCODONE-acetaminophen (PERCOCET/ROXICET) 5-325 MG tablet Take 1 tablet by mouth every 6 (six) hours as needed for severe pain.  . pantoprazole (PROTONIX) 40 MG tablet Take 1 tablet (40 mg total) by mouth  daily.  . pantoprazole (PROTONIX) 40 MG tablet TAKE 1 TABLET BY MOUTH EVERY DAY  . pentoxifylline (TRENTAL) 400 MG CR tablet Take 1 tablet (400 mg total) by mouth 3 (three) times daily with meals.  . polyethylene glycol (MIRALAX) packet Take 17 g by mouth daily. (Patient taking differently: Take 17 g by mouth See admin instructions. Take 17 grams by mouth, mixed, once a day only when taking pain meds)  . sucroferric oxyhydroxide (VELPHORO) 500 MG chewable tablet Chew 3 tablets (1,500 mg total) by mouth 3 (three) times daily.    FAMILY HISTORY:  His family history includes Diabetes in his father and mother; Hypertension in his father and mother.  SOCIAL HISTORY: Former tobacco abuse  REVIEW OF SYSTEMS:   As per HPI, unable to obtain from patient as he is intubated on MV  SUBJECTIVE:  Arrived on insulin gtt and propofol at 15 mcg/kg Patient denies any chest pain   VITAL SIGNS: BP (!) 75/52   Pulse (!) 105   Temp 98.2 F (36.8 C)   Resp (!) 22   Ht 6' 0.44" (1.84 m)   Wt 96.8 kg  SpO2 96%   BMI 28.59 kg/m   HEMODYNAMICS:    VENTILATOR SETTINGS: Vent Mode: PRVC FiO2 (%):  [40 %-100 %] 40 % Set Rate:  [16 bmp-24 bmp] 24 bmp Vt Set:  [580 mL] 580 mL PEEP:  [5 cmH20] 5 cmH20 Plateau Pressure:  [12 NUU72-53 cmH20] 12 cmH20  INTAKE / OUTPUT: I/O last 3 completed shifts: In: 500 [IV Piggyback:500] Out: -   PHYSICAL EXAMINATION: General:  Critically ill adult AAM on MV in NAD HEENT: MM pink/dry, pupils 3/reactive, anicteric, ETT 8 at 25 cm at lip, OGT, +JVD Neuro: Awake and calm, MAE, follows commands CV: SR, no murmur, +1 pulses, AVF RUE PULM: even/non-labored on MV, lungs bilaterally CTA GI: soft, non-tender, bs active  Extremities: cool/ dry, trace LE edema, left tibial IO Skin: no rashes   LABS:  BMET Recent Labs  Lab 09/24/17 1709 09/24/17 1822  NA 121* 120*  K 7.3* 7.0*  CL 72* 83*  CO2 19*  --   BUN 57* 65*  CREATININE 10.32* 10.60*  GLUCOSE 893*  >700*    Electrolytes Recent Labs  Lab 09/24/17 1709  CALCIUM 9.4    CBC Recent Labs  Lab 09/24/17 1709 09/24/17 1822  WBC 9.7  --   HGB 14.5 16.0  HCT 45.2 47.0  PLT 148*  --     Coag's Recent Labs  Lab 09/24/17 1847  APTT 28  INR 1.04    Sepsis Markers No results for input(s): LATICACIDVEN, PROCALCITON, O2SATVEN in the last 168 hours.  ABG Recent Labs  Lab 09/24/17 1810  PHART 7.225*  PCO2ART 40.0  PO2ART 434*    Liver Enzymes No results for input(s): AST, ALT, ALKPHOS, BILITOT, ALBUMIN in the last 168 hours.  Cardiac Enzymes No results for input(s): TROPONINI, PROBNP in the last 168 hours.  Glucose Recent Labs  Lab 09/24/17 1716 09/24/17 1756 09/24/17 1933 09/24/17 2056  GLUCAP >600* >600* >600* >600*    Imaging Dg Chest Port 1 View  Result Date: 09/24/2017 CLINICAL DATA:  Initial evaluation for acute chest pain, status post CPR. EXAM: PORTABLE CHEST 1 VIEW COMPARISON:  Prior radiograph from 08/07/2017 FINDINGS: Endotracheal tube in place with tip positioned 3.9 cm above the carina. Enteric tube courses into the abdomen. Defibrillator pads overlie the left chest. Moderate cardiomegaly, stable.  Mediastinal silhouette normal. Lungs mildly hypoinflated. Diffuse vascular and interstitial prominence consistent with mild diffuse pulmonary interstitial edema. No focal infiltrates. No pleural effusion. No pneumothorax. No acute osseous abnormality. Vascular stent overlies the distal right subclavian region. IMPRESSION: 1. Support apparatus in satisfactory position as above. 2. Cardiomegaly with mild diffuse pulmonary interstitial edema. Electronically Signed   By: Jeannine Boga M.D.   On: 09/24/2017 18:44   STUDIES:  TTE >>  CULTURES: 8/24 MRSA PCR >>  ANTIBIOTICS: none  SIGNIFICANT EVENTS: 8/14 Admit  LINES/TUBES: PIV x 2 8/14 ETT >> 8/24 OGT >> 8/24 foley >> 8/24 R IJ temp HD >>  DISCUSSION: 30 yoM with PMH of ESRD on home four  days a week ( M/ T/ TH/ F) HD, DM, HTN, systolic HF, AS, GERD, and PVD on transplant list at [facility] for kidney/ pancreas,  presenting with N/V since 3 am with development of chest pain ~3pm.  Had Vfib cardiac arrest with ROSC after 9 mins.  Found to be hyperkalemic, glucose 893, and chronic prolonged QTc, trop 0.13, EKG with some STD, non acute.  Transferred to [facility] for further care per CCM, Renal, Cards   ASSESSMENT / PLAN:  Vfib cardiac arrest  - 9 mins CPR prior to ROSC - most likely related to metabolic derangements +/- prolonged QTc ( unclear if patient received prolonging meds prior to arrest), although can not rule out ACS, initial EKGs non acute, troponin mildly elevated  P:  ICU monitoring Serial EKG, showing improvement in lateral STD and improvement in QTc measurement from max 0.606 after arrest Cardiology consulted, spoke with Dr. Paticia Stack, will see tonight, appreciate input On arrival to [facility] ICU, he is awake on the vent, following commands, and MAE therefore will hold on TTM protocol, but will ensure normothermia x 3 days See below  Metabolic acidosis and hyperkalemia in the setting of DKA ESRD on home dialysis MTTHF (does still urinate) Corrected Na ( 133) - no urine output thus far - s/p 1L NS, finishing 2nd L  P:  Appreciate Nephrology seeing Starting CRRT, will place temporary catheter now  Monitor UOP/ daily wts Renal panel q 12  Hypotension - cardiogenic +/- hypovolemic Prolonged QTc - hx of HTN,  systolic HF 25%, CAD- known 3 vessel disease, followed by [facility] - last RHC/LHC 04/01/17 at [facility], 3 vessel CAD with plans for medical management, after prior EF 35% on TEE - wife reports recently has been more hypertensive for the last several weeks.  F/u at [facility] 8/1 and felt possibly that it may be a sign that his EF is improving per note - likely in the setting of dehydration given hyperglycemia/ vomiting +/- sedation with propofol  - fluid responsive thus far -  normal WBC, no fever, sepsis unlikely P:  S/p 2nd liter NS bolus with improvement of BP Levophed prn for goal MAP > 65 PRN Insert Aline if remains hypotensive Trend troponins TTE in am  Trend lactate Goal MAP > 65 Levophed prn Avoid QTc prolonging meds Check Mag S/p ASA pr, then daily , will defer systemic heparin to cards   Acute respiratory failure in the setting of cardiac arrest - CXR here shows good position of ETT/ OGT and R IJ HD cath, no acute findings P:  Continue Full MV support, PRVC 8 cc/kg, rate 24 CXR as above Repeat ABG, can likely come down on rate as CRRT starts If remains hemodynamically stable, can likely be extubated in the am VAP measures PAD protocol with PRN fentanyl and versed D/c propofol given hypotension Bowel regimen in place  Minimize sedation, daily WUA  Anemia -Hgb stable P:  Monitor  GERD P:  protonix daily per tube   Thrombocytopenia P:  Trend   Nausea/ Vomiting P:  Assess LFTs Caution with any further antiemetics given prolonged QTc  DVT prophylaxis: SCDs, heparin SQ SUP: protonix  Diet: NPO, if remains intubated start TF in am  Activity: bedrest Disposition : ICU   FAMILY  - Updates: Wife and remainder of family updated at bedside.   - Inter-disciplinary family meet or Palliative Care meeting due by:  8/21  CCT 60 mins  Kennieth Rad, AGACNP-BC [place] [facility] Pgr: [phone] or if no answer [phone] 09/24/2017, 11:43 PM

## 2017-09-24 NOTE — Procedures (Signed)
Central Venous Catheter Insertion Procedure Note Lance [name] [id number] 1970/10/03  Procedure: Insertion of Central Venous Catheter Indications: Assessment of intravascular volume, Drug and/or fluid administration, Frequent blood sampling and continous dialysis/ CRRT  Procedure Details Consent: Risks of procedure as well as the alternatives and risks of each were explained to the (patient/caregiver).  Consent for procedure obtained. Time Out: Verified patient identification, verified procedure, site/side was marked, verified correct patient position, special equipment/implants available, medications/allergies/relevent history reviewed, required imaging and test results available.  Performed  Maximum sterile technique was used including antiseptics, cap, gloves, gown, hand hygiene, mask and sheet. Skin prep: Chlorhexidine; local anesthetic administered 1% 48ml A triple lumen trialysis catheter was placed in the right internal jugular vein using the Seldinger technique to 18 cm.  Line sutured.  Biopatch and sterile dressing applied.   Evaluation Blood flow good Complications: No apparent complications Patient did tolerate procedure well. Chest X-ray ordered to verify placement.  CXR: pending.  Procedure performed with ultrasound guidance for real time vessel cannulation.     Kennieth Rad, AGACNP-BC [place] [facility] Pgr: [phone] or if no answer [phone] 09/24/2017, 10:34 PM

## 2017-09-24 NOTE — ED Triage Notes (Signed)
Pt c/o mid chest pain radiating into back since 3am. N/v x6 since 5am. C/o lightheaded. Denies sob. Pt is DM and dialysis pt. Non diaphoretic at this time

## 2017-09-24 NOTE — ED Notes (Signed)
Family called staff to room.  PT unrsponsive, vfib on monitor, edp at bedside, cpr initiated.  Pt moved to room 2.  Pt being bagged by resp

## 2017-09-24 NOTE — Progress Notes (Addendum)
Clarified with wife: Pt does hemodialysis at home, is on a M/Tu/Th/F schedule. Per pt's wife, did complete Monday and Tuesday HD.  Will update PMH documentation to reflect this.

## 2017-09-24 NOTE — ED Notes (Signed)
Date and time results received: 09/24/17 5:45 PM    Test: calcium  Critical Value: 6.3  Name of Provider Notified: brian cook   Orders Received? Or Actions Taken?:

## 2017-09-24 NOTE — ED Notes (Signed)
Per MD  1 calcium chloride 1 sodium bicarb Increase insulin to 30 7 units of Novolog 1 amp of D50  1 albuterol treatment, paged respiratory

## 2017-09-24 NOTE — ED Notes (Signed)
10 units of regular insulin given at 1742 and again at 1745.  Another 7 units were given via bolus from insulin drip bag at 1804.  Insulin drip running at 20units per hour.

## 2017-09-25 ENCOUNTER — Encounter (HOSPITAL_COMMUNITY): Payer: Self-pay

## 2017-09-25 ENCOUNTER — Inpatient Hospital Stay (HOSPITAL_COMMUNITY): Payer: Medicare Other

## 2017-09-25 DIAGNOSIS — I469 Cardiac arrest, cause unspecified: Secondary | ICD-10-CM

## 2017-09-25 DIAGNOSIS — I361 Nonrheumatic tricuspid (valve) insufficiency: Secondary | ICD-10-CM

## 2017-09-25 DIAGNOSIS — J9601 Acute respiratory failure with hypoxia: Secondary | ICD-10-CM

## 2017-09-25 DIAGNOSIS — N186 End stage renal disease: Secondary | ICD-10-CM

## 2017-09-25 DIAGNOSIS — I25119 Atherosclerotic heart disease of native coronary artery with unspecified angina pectoris: Secondary | ICD-10-CM

## 2017-09-25 LAB — RENAL FUNCTION PANEL
ANION GAP: 16 — AB (ref 5–15)
ANION GAP: 10 (ref 5–15)
Albumin: 3.2 g/dL — ABNORMAL LOW (ref 3.5–5.0)
Albumin: 3 g/dL — ABNORMAL LOW (ref 3.5–5.0)
BUN: 44 mg/dL — ABNORMAL HIGH (ref 6–20)
BUN: 27 mg/dL — ABNORMAL HIGH (ref 6–20)
CALCIUM: 8.4 mg/dL — AB (ref 8.9–10.3)
CHLORIDE: 94 mmol/L — AB (ref 98–111)
CHLORIDE: 99 mmol/L (ref 98–111)
CO2: 29 mmol/L (ref 22–32)
CO2: 25 mmol/L (ref 22–32)
CREATININE: 8.35 mg/dL — AB (ref 0.61–1.24)
CREATININE: 6.16 mg/dL — AB (ref 0.61–1.24)
Calcium: 9.9 mg/dL (ref 8.9–10.3)
GFR calc non Af Amer: 10 mL/min — ABNORMAL LOW (ref >60)
GFR, EST AFRICAN AMERICAN: 8 mL/min — AB (ref >60)
GFR, EST AFRICAN AMERICAN: 11 mL/min — AB (ref >60)
GFR, EST NON AFRICAN AMERICAN: 7 mL/min — AB (ref >60)
Glucose, Bld: 135 mg/dL — ABNORMAL HIGH (ref 70–99)
Glucose, Bld: 305 mg/dL — ABNORMAL HIGH (ref 70–99)
POTASSIUM: 3.2 mmol/L — AB (ref 3.5–5.1)
Phosphorus: 1.2 mg/dL — ABNORMAL LOW (ref 2.5–4.6)
Phosphorus: 2.4 mg/dL — ABNORMAL LOW (ref 2.5–4.6)
Potassium: 4.1 mmol/L (ref 3.5–5.1)
Sodium: 139 mmol/L (ref 135–145)
Sodium: 134 mmol/L — ABNORMAL LOW (ref 135–145)

## 2017-09-25 LAB — TROPONIN I
TROPONIN I: 1.76 ng/mL — AB (ref <0.03)
Troponin I: 3.63 ng/mL (ref <0.03)
Troponin I: 3.93 ng/mL (ref <0.03)

## 2017-09-25 LAB — COOXEMETRY PANEL
Carboxyhemoglobin: 1.6 % — ABNORMAL HIGH (ref 0.5–1.5)
Methemoglobin: 1.2 % (ref 0.0–1.5)
O2 SAT: 67 %
Total hemoglobin: 13.6 g/dL (ref 12.0–16.0)

## 2017-09-25 LAB — MAGNESIUM
MAGNESIUM: 2.5 mg/dL — AB (ref 1.7–2.4)
MAGNESIUM: 2.3 mg/dL (ref 1.7–2.4)

## 2017-09-25 LAB — BLOOD GAS, ARTERIAL
ACID-BASE EXCESS: 5 mmol/L — AB (ref 0.0–2.0)
BICARBONATE: 28.6 mmol/L — AB (ref 20.0–28.0)
Drawn by: 511911
FIO2: 40
LHR: 24 {breaths}/min
O2 SAT: 99.2 %
PEEP/CPAP: 5 cmH2O
PH ART: 7.475 — AB (ref 7.350–7.450)
Patient temperature: 98.6
VT: 580 mL
pCO2 arterial: 39.3 mmHg (ref 32.0–48.0)
pO2, Arterial: 194 mmHg — ABNORMAL HIGH (ref 83.0–108.0)

## 2017-09-25 LAB — GLUCOSE, CAPILLARY
GLUCOSE-CAPILLARY: 111 mg/dL — AB (ref 70–99)
GLUCOSE-CAPILLARY: 119 mg/dL — AB (ref 70–99)
GLUCOSE-CAPILLARY: 120 mg/dL — AB (ref 70–99)
GLUCOSE-CAPILLARY: 172 mg/dL — AB (ref 70–99)
GLUCOSE-CAPILLARY: 229 mg/dL — AB (ref 70–99)
GLUCOSE-CAPILLARY: 325 mg/dL — AB (ref 70–99)
GLUCOSE-CAPILLARY: 381 mg/dL — AB (ref 70–99)
Glucose-Capillary: 263 mg/dL — ABNORMAL HIGH (ref 70–99)
Glucose-Capillary: 204 mg/dL — ABNORMAL HIGH (ref 70–99)
Glucose-Capillary: 137 mg/dL — ABNORMAL HIGH (ref 70–99)
Glucose-Capillary: 119 mg/dL — ABNORMAL HIGH (ref 70–99)
Glucose-Capillary: 263 mg/dL — ABNORMAL HIGH (ref 70–99)

## 2017-09-25 LAB — ECHOCARDIOGRAM COMPLETE
HEIGHTINCHES: 72.441 in
Weight: 3414.48 oz

## 2017-09-25 LAB — CBC
HCT: 40.2 % (ref 39.0–52.0)
HEMOGLOBIN: 13.5 g/dL (ref 13.0–17.0)
MCH: 30.3 pg (ref 26.0–34.0)
MCHC: 33.6 g/dL (ref 30.0–36.0)
MCV: 90.3 fL (ref 78.0–100.0)
PLATELETS: 141 10*3/uL — AB (ref 150–400)
RBC: 4.45 MIL/uL (ref 4.22–5.81)
RDW: 16.7 % — ABNORMAL HIGH (ref 11.5–15.5)
WBC: 13.5 10*3/uL — AB (ref 4.0–10.5)

## 2017-09-25 LAB — LACTIC ACID, PLASMA
LACTIC ACID, VENOUS: 2.2 mmol/L — AB (ref 0.5–1.9)
Lactic Acid, Venous: 6.3 mmol/L (ref 0.5–1.9)

## 2017-09-25 LAB — BETA-HYDROXYBUTYRIC ACID: Beta-Hydroxybutyric Acid: 1.45 mmol/L — ABNORMAL HIGH (ref 0.05–0.27)

## 2017-09-25 LAB — HEPARIN LEVEL (UNFRACTIONATED): HEPARIN UNFRACTIONATED: 0.79 [IU]/mL — AB (ref 0.30–0.70)

## 2017-09-25 LAB — TRIGLYCERIDES: TRIGLYCERIDES: 102 mg/dL (ref <150)

## 2017-09-25 MED ORDER — PRISMASOL BGK 4/2.5 32-4-2.5 MEQ/L IV SOLN
INTRAVENOUS | Status: DC
  Administered 2017-09-25 – 2017-09-26 (×7): via INTRAVENOUS_CENTRAL
  Filled 2017-09-25 (×13): qty 5000

## 2017-09-25 MED ORDER — PANTOPRAZOLE SODIUM 40 MG PO TBEC
40.0000 mg | DELAYED_RELEASE_TABLET | Freq: Every day | ORAL | Status: DC
  Administered 2017-09-25 – 2017-09-30 (×6): 40 mg via ORAL
  Filled 2017-09-25 (×6): qty 1

## 2017-09-25 MED ORDER — TRAMADOL HCL 50 MG PO TABS
100.0000 mg | ORAL_TABLET | Freq: Two times a day (BID) | ORAL | Status: DC | PRN
  Administered 2017-09-25 – 2017-09-28 (×4): 100 mg via ORAL
  Filled 2017-09-25 (×5): qty 2

## 2017-09-25 MED ORDER — PRISMASOL BGK 4/2.5 32-4-2.5 MEQ/L IV SOLN
INTRAVENOUS | Status: DC
  Administered 2017-09-25 – 2017-09-26 (×2): via INTRAVENOUS_CENTRAL
  Filled 2017-09-25 (×4): qty 5000

## 2017-09-25 MED ORDER — CHLORHEXIDINE GLUCONATE CLOTH 2 % EX PADS: 6.0000 | MEDICATED_PAD | Freq: Every day | CUTANEOUS | Status: DC

## 2017-09-25 MED ORDER — MIDAZOLAM HCL 2 MG/2ML IJ SOLN
1.0000 mg | INTRAMUSCULAR | Status: DC | PRN
  Administered 2017-09-25: 1 mg via INTRAVENOUS
  Filled 2017-09-25: qty 2

## 2017-09-25 MED ORDER — FENTANYL CITRATE (PF) 100 MCG/2ML IJ SOLN
50.0000 ug | INTRAMUSCULAR | Status: DC | PRN
  Administered 2017-09-25 – 2017-09-26 (×7): 50 ug via INTRAVENOUS
  Filled 2017-09-25 (×7): qty 2

## 2017-09-25 MED ORDER — INSULIN GLARGINE 100 UNIT/ML ~~LOC~~ SOLN
25.0000 [IU] | Freq: Every day | SUBCUTANEOUS | Status: DC
  Administered 2017-09-25 – 2017-09-28 (×4): 25 [IU] via SUBCUTANEOUS
  Filled 2017-09-25 (×4): qty 0.25

## 2017-09-25 MED ORDER — MIDAZOLAM HCL 2 MG/2ML IJ SOLN: 1.0000 mg | INTRAMUSCULAR | Status: DC | PRN

## 2017-09-25 MED ORDER — INSULIN GLARGINE 100 UNIT/ML ~~LOC~~ SOLN
25.0000 [IU] | Freq: Every day | SUBCUTANEOUS | Status: DC
  Filled 2017-09-25: qty 0.25

## 2017-09-25 MED ORDER — SODIUM CHLORIDE 0.9 % IV SOLN
INTRAVENOUS | Status: DC
  Administered 2017-09-25: 1000 mL via INTRAVENOUS
  Administered 2017-09-25: 12:00:00 via INTRAVENOUS

## 2017-09-25 MED ORDER — INSULIN DETEMIR 100 UNIT/ML ~~LOC~~ SOLN
12.0000 [IU] | Freq: Once | SUBCUTANEOUS | Status: AC
  Administered 2017-09-25: 12 [IU] via SUBCUTANEOUS
  Filled 2017-09-25: qty 0.12

## 2017-09-25 MED ORDER — PRISMASOL BGK 4/2.5 32-4-2.5 MEQ/L IV SOLN
INTRAVENOUS | Status: DC
  Administered 2017-09-25 – 2017-09-26 (×3): via INTRAVENOUS_CENTRAL
  Filled 2017-09-25 (×6): qty 5000

## 2017-09-25 MED ORDER — CHLORHEXIDINE GLUCONATE CLOTH 2 % EX PADS
6.0000 | MEDICATED_PAD | Freq: Every day | CUTANEOUS | Status: DC
  Administered 2017-09-26 – 2017-09-30 (×2): 6 via TOPICAL

## 2017-09-25 MED ORDER — HEPARIN (PORCINE) IN NACL 100-0.45 UNIT/ML-% IJ SOLN
1050.0000 [IU]/h | INTRAMUSCULAR | Status: DC
  Administered 2017-09-25: 1400 [IU]/h via INTRAVENOUS
  Administered 2017-09-25: 1300 [IU]/h via INTRAVENOUS
  Filled 2017-09-25 (×2): qty 250

## 2017-09-25 MED ORDER — SODIUM CHLORIDE 0.9% FLUSH
10.0000 mL | Freq: Two times a day (BID) | INTRAVENOUS | Status: DC
  Administered 2017-09-25: 10 mL via INTRAVENOUS
  Administered 2017-09-25: 40 mL via INTRAVENOUS
  Administered 2017-09-26: 10 mL via INTRAVENOUS

## 2017-09-25 MED ORDER — PROPOFOL 1000 MG/100ML IV EMUL
0.0000 ug/kg/min | INTRAVENOUS | Status: DC
  Administered 2017-09-25: 20 ug/kg/min via INTRAVENOUS
  Filled 2017-09-25: qty 100

## 2017-09-25 MED ORDER — SODIUM CHLORIDE 0.9 % IV BOLUS
500.0000 mL | Freq: Once | INTRAVENOUS | Status: AC
  Administered 2017-09-25: 500 mL via INTRAVENOUS

## 2017-09-25 MED ORDER — INSULIN ASPART 100 UNIT/ML ~~LOC~~ SOLN: 0.0000 [IU] | SUBCUTANEOUS | Status: DC

## 2017-09-25 MED ORDER — ORAL CARE MOUTH RINSE
15.0000 mL | Freq: Two times a day (BID) | OROMUCOSAL | Status: DC
  Administered 2017-09-25 – 2017-09-26 (×2): 15 mL via OROMUCOSAL

## 2017-09-25 MED ORDER — DEXTROSE-NACL 5-0.45 % IV SOLN
INTRAVENOUS | Status: DC
  Administered 2017-09-25: 500 mL via INTRAVENOUS

## 2017-09-25 MED ORDER — PANTOPRAZOLE SODIUM 40 MG PO PACK: 40.0000 mg | PACK | Freq: Every day | ORAL | Status: DC

## 2017-09-25 MED ORDER — INSULIN ASPART 100 UNIT/ML ~~LOC~~ SOLN
0.0000 [IU] | SUBCUTANEOUS | Status: DC
  Administered 2017-09-25: 8 [IU] via SUBCUTANEOUS
  Administered 2017-09-25: 4 [IU] via SUBCUTANEOUS
  Administered 2017-09-25 – 2017-09-26 (×2): 12 [IU] via SUBCUTANEOUS
  Administered 2017-09-26: 2 [IU] via SUBCUTANEOUS
  Administered 2017-09-26: 12 [IU] via SUBCUTANEOUS
  Administered 2017-09-27: 4 [IU] via SUBCUTANEOUS
  Administered 2017-09-27 (×2): 8 [IU] via SUBCUTANEOUS
  Administered 2017-09-28 (×3): 4 [IU] via SUBCUTANEOUS
  Administered 2017-09-28: 2 [IU] via SUBCUTANEOUS
  Administered 2017-09-29: 8 [IU] via SUBCUTANEOUS

## 2017-09-25 MED ORDER — FENTANYL CITRATE (PF) 100 MCG/2ML IJ SOLN: 25.0000 ug | INTRAMUSCULAR | Status: DC | PRN

## 2017-09-25 MED FILL — Medication: Qty: 1 | Status: AC

## 2017-09-25 NOTE — Progress Notes (Signed)
Echocardiogram 2D Echocardiogram has been performed.  Joelene Millin 09/25/2017, 8:16 AM

## 2017-09-25 NOTE — Progress Notes (Signed)
Inpatient Diabetes Program Recommendations  AACE/ADA: New Consensus Statement on Inpatient Glycemic Control (2019)  Target Ranges:  Prepandial:   less than 140 mg/dL      Peak postprandial:   less than 180 mg/dL (1-2 hours)      Critically ill patients:  140 - 180 mg/dL  Results for Lance [name], Lance [name] (MRN [id number]) as of 09/25/2017 07:45  Ref. Range 09/25/2017 00:14 09/25/2017 01:19 09/25/2017 03:30 09/25/2017 04:48 09/25/2017 05:51 09/25/2017 07:05  Glucose-Capillary Latest Ref Range: 70 - 99 mg/dL 381 (H) 325 (H) 204 (H) 137 (H) 120 (H) 111 (H)  Results for Lance [name], Lance [name] (MRN [id number]) as of 09/25/2017 07:45  Ref. Range 09/24/2017 17:09  Glucose Latest Ref Range: 70 - 99 mg/dL [facility])   Results for Lance [name], Lance [name] (MRN [id number]) as of 09/25/2017 07:45  Ref. Range 02/18/2017 14:38 07/25/2017 07:19  Hemoglobin A1C Latest Ref Range: 4.8 - 5.6 % 9.1 (H) 6.7 (H)   Review of Glycemic Control  Diabetes history: DM1 (makes no insulin; requires basal, correction, and meal coverage insulin) Outpatient Diabetes medications: Lantus 25 units QHS, Humalog 15-20 units TID Current orders for Inpatient glycemic control: IV insulin drip per GlucoStablizer  Inpatient Diabetes Program Recommendations: Insulin - Basal: At time of transition from IV to SQ insulin, please consider ordering Lantus 25 units Q24H. Correction (SSI): At time of transition from IV to SQ insulin, please consider ordering CBGs with Novolog 0-9 units Q4H. HgbA1C: Last A1C was 6.7% on 07/25/2017. Patient has been inpatient 4 times in past 6 months and was last seen by Diabetes Coordinator on 06/09/2017.  NOTE: Patient has Type 1 DM (makes no insulin) and is followed by Dr. Dorris Fetch for DM control. Per chart, patient last seen Dr. Dorris Fetch on 07/23/17 and is prescribed Lantus 25 units QHS, Humalog 15 units with breakfast and lunch, Humalog 10 units with supper. Patient's initial glucose noted to be 893 mg/dl and patient is currently on  IV insulin drip. Once MD is ready to transtion from IV to SQ insulin, recommend ordering Lantus 25 Q24H, CBGs with Novolog 0-9 units Q4H. Once diet is ordered patient will need Novolog meal coverage ordered to cover carbohydrates (recommend Novolog 3 units TID with meals for meal coverage). Will follow while inpatient.  Thanks, [name], RN, MSN, CDE Diabetes Coordinator Inpatient Diabetes Program [phone] (Team Pager from 8am to 5pm)

## 2017-09-25 NOTE — Care Management Note (Signed)
Case Management Note Marvetta Gibbons RN,BSN Unit 2H 1-22 - RN Care Coordinator (Case Management) [phone]  Patient Details  Name: VIDUR KNUST MRN: [id number] Date of Birth: November 03, 1970  Subjective/Objective:  Pt admitted s/p cardiac arrest, hx of ESRD- started on home dialysis M/T/T/F 2 mo. Ago- currently on CRRT.  Recent Right BKA               Action/Plan: PTA pt lived at home with wife, CM to follow for transition of care needs.   Expected Discharge Date:                  Expected Discharge Plan:     In-House Referral:     Discharge planning Services  CM Consult  Post Acute Care Choice:    Choice offered to:     DME Arranged:    DME Agency:     HH Arranged:    HH Agency:     Status of Service:  In process, will continue to follow  If discussed at Long Length of Stay Meetings, dates discussed:    Discharge Disposition:   Additional Comments:  Dawayne Patricia, RN 09/25/2017, 11:16 AM

## 2017-09-25 NOTE — Progress Notes (Addendum)
ANTICOAGULATION CONSULT NOTE - Initial Consult  Pharmacy Consult for Heparin  Indication: chest pain/ACS  Allergies  Allergen Reactions  . Atorvastatin Other (See Comments)    Muscle pain   . Minoxidil Other (See Comments)    Fluid retention   . Adhesive [Tape] Other (See Comments)    MUST USE "SILK TAPE," AS ANY OTHER "BURNS" THE SKIN  . Statins Other (See Comments)    Muscle pain    Patient Measurements: Height: 6' 0.44" (184 cm) Weight: 213 lb 6.5 oz (96.8 kg) IBW/kg (Calculated) : 78.61  Vital Signs: Temp: 98.2 F (36.8 C) (08/15 1100) Temp Source: Oral (08/15 1100) BP: 97/56 (08/15 1500) Pulse Rate: 100 (08/15 1500)  Labs: Recent Labs    09/24/17 1709 09/24/17 1822 09/24/17 1847 09/24/17 2222 09/25/17 0450 09/25/17 1300  HGB 14.5 16.0  --   --  13.5  --   HCT 45.2 47.0  --   --  40.2  --   PLT 148*  --   --   --  141*  --   APTT  --   --  28  --   --   --   LABPROT  --   --  13.5  --   --   --   INR  --   --  1.04  --   --   --   HEPARINUNFRC  --   --   --   --   --  0.79*  CREATININE 10.32* 10.60*  --   --  8.35*  --   TROPONINI  --   --   --  1.76* 3.63*  --     Estimated Creatinine Clearance: 13.1 mL/min (A) (by C-G formula based on SCr of 8.35 mg/dL (H)).   Medical History: Past Medical History:  Diagnosis Date  . Aortic stenosis    Very mild in 05/2007  . Arthritis    "right shoulder; right knee; feel like I've got it all over" (11/28/2015)  . CHF (congestive heart failure) ([place])   . Degenerative joint disease    Left TKA  . Diastolic heart failure (HCC)    LVEF 65-70% with grade 2 diastolic dysfunction  . ESRD (end stage renal disease) on dialysis [facility])    (as of 09/24/2017) pt does HD at home on a M/Tu/Th/F schedule.  . Essential hypertension, benign 2000   LVH  . GERD (gastroesophageal reflux disease)   . HCAP (healthcare-associated pneumonia) 11/28/2015  . Hyperlipidemia 2000  . Loculated pleural effusion 11/28/2015   [name]  11/28/2015  . Pneumonia 12/2013; 05/2014  . PSVT (paroxysmal supraventricular tachycardia) (HCC)    Nonsustained; asymptomatic; diagnosed by event recorder in 2006  . Tobacco abuse, in remission    20 pack years; discontinued in 1985; bronchitic changes on chest x-ray and 2009  . Type 1 diabetes mellitus ([place]) 1990    Assessment: 47 y/o M s/p cardiac arrest thought to be due to hyperkalemia, troponin is elevated at 3.63, starting heparin for now, CBC ok, ESRD on CRRT  -Heparin level above therapeutic goal at 0.79 on 1400 units/hr. -Hgb at 13.5, patient w/ no signs of bleeding according to nursing.  Goal of Therapy:  Heparin level 0.3-0.7 units/ml Monitor platelets by anticoagulation protocol: Yes   Plan:  -Decrease heparin drip to 1300 units/hour -Draw heparin level @ 2200 -Daily CBC/HL, monitor platelets -Monitor for bleeding  Donnal Debar PharmD Candidate 09/25/2017,3:44 PM     Agree with above assessment. Heparin level  supratherapeutic. Will decrease heparin infusion from 1400 units/hr to 1300 units/hr. Recheck HL in ~6 hours.   Claiborne Billings, PharmD PGY2 Cardiology Pharmacy Resident Phone [phone] 09/25/2017 4:03 PM

## 2017-09-25 NOTE — Procedures (Signed)
Extubation Procedure Note  Patient Details:   Name: Lance [name] DOB: 1970/06/28 MRN: [id number]   Airway Documentation:    Vent end date: 09/25/17 Vent end time: 0815   Evaluation  O2 sats: stable throughout Complications: No apparent complications Patient did tolerate procedure well. Bilateral Breath Sounds: Diminished, Clear   Yes pt able to vocalize.   Pt extubated per MD order at this time. Pt was able to breathe around deflated cuff. Pt placed on 4L [place]. No stridor noted. Pt has strong, adequate cough.    Irineo Axon [facility] 09/25/2017, 8:22 AM

## 2017-09-25 NOTE — Progress Notes (Signed)
Pt has not urinated 8/15 except for blood clots. Pt states that he does make urine even on HD 4x week. RN bladder scan pt which 20-70cc urine in bladder. Bladder distended and patient attempting to urinate without success. RN placed foley at this time for possible urinary obstruction and urinary retention. RN flushed foley catheter with 60cc sterile water, return of 60cc blood water with blood clots. Pt tolerated well. RN had to break seal on foley for this procedure. RN will continue to monitor patient.

## 2017-09-25 NOTE — Progress Notes (Signed)
IO removed at this time. Pt tolerated well. RN will continue to monitor.

## 2017-09-25 NOTE — Progress Notes (Signed)
Paged Nephrologist, MD wants to keep patient even at this time. Do not pull any fluid on CRRT. RN will continue to monitor.

## 2017-09-25 NOTE — Progress Notes (Signed)
Assessment/Plan: 1. ESRD - home HD MTTHF NxStage 3.5hr through rt BCF.             Now on CRRT, will adjust K bath.  Will cont CRRT for now.  2. Uncontrolled T1DM: on insulin  3. HTN: more stable 4. PAD - s/p recent R BKA after poor response to a TMA. 5. S/p cardiac arrest 6. Hyperkalemia, resolved  Subjective: Interval History: Tol CRRT, extubated  Objective: Vital signs in last 24 hours: Temp:  [94.3 F (34.6 C)-99.7 F (37.6 C)] 99.3 F (37.4 C) (08/15 0515) Pulse Rate:  [70-109] 101 (08/15 0830) Resp:  [3-28] 15 (08/15 0830) BP: (66-205)/(39-106) 149/71 (08/15 0830) SpO2:  [96 %-100 %] 100 % (08/15 0830) FiO2 (%):  [40 %-100 %] 40 % (08/15 0720) Weight:  [96.8 kg] 96.8 kg (08/14 2050) Weight change:   Intake/Output from previous day: 08/14 0701 - 08/15 0700 In: 954.2 [I.V.:454.2; IV Piggyback:500] Out: 384 [Emesis/NG output:120] Intake/Output this shift: Total I/O In: 252.2 [I.V.:252.2] Out: 20 [Urine:2; Other:18]  General appearance: alert and cooperative Resp: clear to auscultation bilaterally Cardio: regular rate and rhythm, S1, S2 normal, no murmur, click, rub or gallop Extremities: BKA R BCF  Lab Results: Recent Labs    09/24/17 1709 09/24/17 1822 09/25/17 0450  WBC 9.7  --  13.5*  HGB 14.5 16.0 13.5  HCT 45.2 47.0 40.2  PLT 148*  --  141*   BMET:  Recent Labs    09/24/17 1709 09/24/17 1822 09/25/17 0450  NA 121* 120* 139  K 7.3* 7.0* 3.2*  CL 72* 83* 94*  CO2 19*  --  29  GLUCOSE 893* >700* 135*  BUN 57* 65* 44*  CREATININE 10.32* 10.60* 8.35*  CALCIUM 9.4  --  9.9   No results for input(s): PTH in the last 72 hours. Iron Studies: No results for input(s): IRON, TIBC, TRANSFERRIN, FERRITIN in the last 72 hours. Studies/Results: Dg Chest Port 1 View  Result Date: 09/24/2017 CLINICAL DATA:  Check endotracheal tube placement EXAM: PORTABLE CHEST 1 VIEW COMPARISON:  Film from earlier in the same day. FINDINGS: Endotracheal tube and  nasogastric catheter are again seen and stable. Right jugular central line is noted at the cavoatrial junction. No pneumothorax is seen. Right subclavian vein stent is noted. The lungs are well aerated bilaterally. IMPRESSION: No pneumothorax following central line placement. Electronically Signed   By: [name] M.D.   On: 09/24/2017 23:09   Dg Chest Port 1 View  Result Date: 09/24/2017 CLINICAL DATA:  Initial evaluation for acute chest pain, status post CPR. EXAM: PORTABLE CHEST 1 VIEW COMPARISON:  Prior radiograph from 08/07/2017 FINDINGS: Endotracheal tube in place with tip positioned 3.9 cm above the carina. Enteric tube courses into the abdomen. Defibrillator pads overlie the left chest. Moderate cardiomegaly, stable.  Mediastinal silhouette normal. Lungs mildly hypoinflated. Diffuse vascular and interstitial prominence consistent with mild diffuse pulmonary interstitial edema. No focal infiltrates. No pleural effusion. No pneumothorax. No acute osseous abnormality. Vascular stent overlies the distal right subclavian region. IMPRESSION: 1. Support apparatus in satisfactory position as above. 2. Cardiomegaly with mild diffuse pulmonary interstitial edema. Electronically Signed   By: Jeannine Boga M.D.   On: 09/24/2017 18:44    Scheduled: . aspirin  81 mg Per Tube Daily  . Chlorhexidine Gluconate Cloth  6 each Topical Daily  . heparin  2,800 Units Intravenous Once  . insulin aspart  0-24 Units Subcutaneous Q4H  . insulin detemir  12 Units Subcutaneous Once  .  insulin glargine  25 Units Subcutaneous Daily  . mouth rinse  15 mL Mouth Rinse BID  . pantoprazole sodium  40 mg Per Tube Daily  . sodium chloride flush  10-40 mL Intravenous Q12H    LOS: 1 day   Estanislado Emms 09/25/2017,8:51 AM

## 2017-09-25 NOTE — Progress Notes (Signed)
ANTICOAGULATION CONSULT NOTE - Initial Consult  Pharmacy Consult for Heparin  Indication: chest pain/ACS  Allergies  Allergen Reactions  . Atorvastatin Other (See Comments)    Muscle pain   . Minoxidil Other (See Comments)    Fluid retention   . Adhesive [Tape] Other (See Comments)    MUST USE "SILK TAPE," AS ANY OTHER "BURNS" THE SKIN  . Statins Other (See Comments)    Muscle pain    Patient Measurements: Height: 6' 0.44" (184 cm) Weight: 213 lb 6.5 oz (96.8 kg) IBW/kg (Calculated) : 78.61  Vital Signs: Temp: 99.3 F (37.4 C) (08/15 0400) Temp Source: Other (Comment) (08/14 1936) BP: 104/62 (08/15 0400) Pulse Rate: 102 (08/15 0400)  Labs: Recent Labs    09/24/17 1709 09/24/17 1822 09/24/17 1847 09/24/17 2222  HGB 14.5 16.0  --   --   HCT 45.2 47.0  --   --   PLT 148*  --   --   --   APTT  --   --  28  --   LABPROT  --   --  13.5  --   INR  --   --  1.04  --   CREATININE 10.32* 10.60*  --   --   TROPONINI  --   --   --  1.76*    Estimated Creatinine Clearance: 10.4 mL/min (A) (by C-G formula based on SCr of 10.6 mg/dL (H)).   Medical History: Past Medical History:  Diagnosis Date  . Aortic stenosis    Very mild in 05/2007  . Arthritis    "right shoulder; right knee; feel like I've got it all over" (11/28/2015)  . CHF (congestive heart failure) ([place])   . Degenerative joint disease    Left TKA  . Diastolic heart failure (HCC)    LVEF 65-70% with grade 2 diastolic dysfunction  . ESRD (end stage renal disease) on dialysis [facility])    (as of 09/24/2017) pt does HD at home on a M/Tu/Th/F schedule.  . Essential hypertension, benign 2000   LVH  . GERD (gastroesophageal reflux disease)   . HCAP (healthcare-associated pneumonia) 11/28/2015  . Hyperlipidemia 2000  . Loculated pleural effusion 11/28/2015   Lance [name] 11/28/2015  . Pneumonia 12/2013; 05/2014  . PSVT (paroxysmal supraventricular tachycardia) (HCC)    Nonsustained; asymptomatic; diagnosed by event  recorder in 2006  . Tobacco abuse, in remission    20 pack years; discontinued in 1985; bronchitic changes on chest x-ray and 2009  . Type 1 diabetes mellitus ([place]) 1990    Assessment: 47 y/o M s/p cardiac arrest thought to be due to hyperkalemia, troponin is elevated at 1.76, starting heparin for now, CBC ok, ESRD on HD  Goal of Therapy:  Heparin level 0.3-0.7 units/ml Monitor platelets by anticoagulation protocol: Yes   Plan:  -Will defer bolus with recent CPR/subcutaneous heparin administration -Start heparin drip at 1400 units/hr -1300 HL -Daily CBC/HL -Monitor for bleeding  Lance [name] 09/25/2017,4:35 AM

## 2017-09-25 NOTE — Progress Notes (Signed)
Progress Note  Patient Name: Lance [name] Date of Encounter: 09/25/2017  Primary Cardiologist:   Subjective   Just extubated this am  Inpatient Medications    Scheduled Meds: . aspirin  81 mg Per Tube Daily  . Chlorhexidine Gluconate Cloth  6 each Topical Daily  . heparin  2,800 Units Intravenous Once  . insulin aspart  0-24 Units Subcutaneous Q4H  . insulin detemir  12 Units Subcutaneous Once  . insulin glargine  25 Units Subcutaneous Daily  . mouth rinse  15 mL Mouth Rinse BID  . pantoprazole sodium  40 mg Per Tube Daily  . sodium chloride flush  10-40 mL Intravenous Q12H   Continuous Infusions: . sodium chloride 250 mL/hr at 09/25/17 0900  . heparin 1,400 Units/hr (09/25/17 0900)  . norepinephrine (LEVOPHED) Adult infusion Stopped (09/25/17 0824)  . dialysis replacement fluid (prismasate)    . dialysis replacement fluid (prismasate)    . dialysate (PRISMASATE)    . sodium chloride 500 mL (09/25/17 0800)  . sodium chloride     PRN Meds: fentaNYL (SUBLIMAZE) injection, heparin, sodium chloride   Vital Signs    Vitals:   09/25/17 0800 09/25/17 0815 09/25/17 0830 09/25/17 0845  BP: (!) 88/51 (!) 149/67 (!) 149/71 123/66  Pulse: (!) 103 100 (!) 101 (!) 106  Resp: 20 17 15 16   Temp:      TempSrc:      SpO2: 100% 100% 100% 100%  Weight:      Height:        Intake/Output Summary (Last 24 hours) at 09/25/2017 0907 Last data filed at 09/25/2017 0900 Gross per 24 hour  Intake 1413.85 ml  Output 404 ml  Net 1009.85 ml    I/O since admission: +1009  Filed Weights   09/24/17 2050  Weight: 96.8 kg    Telemetry    Sinus tachycardia 102- Personally Reviewed  ECG    ECG (independently read by me): ST at 100; LVH with repolarization, increased QTc at 528 msec  Physical Exam   BP 123/66   Pulse (!) 106   Temp 99.3 F (37.4 C)   Resp 16   Ht 6' 0.44" (1.84 m)   Wt 96.8 kg   SpO2 100%   BMI 28.59 kg/m  General: Alert, oriented, no distress.    Skin: normal turgor, no rashes, warm and dry HEENT: Normocephalic, atraumatic. Pupils equal round and reactive to light; sclera anicteric; extraocular muscles intact; Nose without nasal septal hypertrophy Mouth/Parynx benign; Mallinpatti scale 3 Neck: No JVD, no carotid bruits; normal carotid upstroke Lungs: clear to ausculatation and percussion; no wheezing or rales Chest wall: without tenderness to palpitation Heart: PMI not displaced, RRR, s1 s2 normal, 1/6 systolic murmur, no diastolic murmur, no rubs, gallops, thrills, or heaves Abdomen: soft, nontender; no hepatosplenomehaly, BS+; abdominal aorta nontender and not dilated by palpation. Back: no CVA tenderness Pulses 2+ Extremities: R BKA; no clubbing cyanosis or edema, Homan's sign negative  Neurologic: grossly nonfocal; Cranial nerves grossly wnl Psychologic: Normal mood and affect   Labs    Chemistry Recent Labs  Lab 09/24/17 1709 09/24/17 1822 09/25/17 0450  NA 121* 120* 139  K 7.3* 7.0* 3.2*  CL 72* 83* 94*  CO2 19*  --  29  GLUCOSE 893* >700* 135*  BUN 57* 65* 44*  CREATININE 10.32* 10.60* 8.35*  CALCIUM 9.4  --  9.9  ALBUMIN  --   --  3.2*  GFRNONAA 5*  --  7*  GFRAA 6*  --  8*  ANIONGAP 30*  --  16*     Hematology Recent Labs  Lab 09/24/17 1709 09/24/17 1822 09/25/17 0450  WBC 9.7  --  13.5*  RBC 4.75  --  4.45  HGB 14.5 16.0 13.5  HCT 45.2 47.0 40.2  MCV 95.2  --  90.3  MCH 30.5  --  30.3  MCHC 32.1  --  33.6  RDW 17.2*  --  16.7*  PLT 148*  --  141*    Cardiac Enzymes Recent Labs  Lab 09/24/17 2222 09/25/17 0450  TROPONINI 1.76* 3.63*    Recent Labs  Lab 09/24/17 1727  TROPIPOC 0.16*     BNPNo results for input(s): BNP, PROBNP in the last 168 hours.   DDimer No results for input(s): DDIMER in the last 168 hours.   Lipid Panel     Component Value Date/Time   CHOL 174 12/28/2013 0707   TRIG 102 09/25/2017 0450   HDL 39 (L) 12/28/2013 0707   CHOLHDL 4.5 12/28/2013 0707    VLDL 46 (H) 12/28/2013 0707   LDLCALC 89 12/28/2013 0707    Radiology    Dg Chest Port 1 View  Result Date: 09/24/2017 CLINICAL DATA:  Check endotracheal tube placement EXAM: PORTABLE CHEST 1 VIEW COMPARISON:  Film from earlier in the same day. FINDINGS: Endotracheal tube and nasogastric catheter are again seen and stable. Right jugular central line is noted at the cavoatrial junction. No pneumothorax is seen. Right subclavian vein stent is noted. The lungs are well aerated bilaterally. IMPRESSION: No pneumothorax following central line placement. Electronically Signed   By: [name] M.D.   On: 09/24/2017 23:09   Dg Chest Port 1 View  Result Date: 09/24/2017 CLINICAL DATA:  Initial evaluation for acute chest pain, status post CPR. EXAM: PORTABLE CHEST 1 VIEW COMPARISON:  Prior radiograph from 08/07/2017 FINDINGS: Endotracheal tube in place with tip positioned 3.9 cm above the carina. Enteric tube courses into the abdomen. Defibrillator pads overlie the left chest. Moderate cardiomegaly, stable.  Mediastinal silhouette normal. Lungs mildly hypoinflated. Diffuse vascular and interstitial prominence consistent with mild diffuse pulmonary interstitial edema. No focal infiltrates. No pleural effusion. No pneumothorax. No acute osseous abnormality. Vascular stent overlies the distal right subclavian region. IMPRESSION: 1. Support apparatus in satisfactory position as above. 2. Cardiomegaly with mild diffuse pulmonary interstitial edema. Electronically Signed   By: Jeannine Boga M.D.   On: 09/24/2017 18:44    Cardiac Studies   ECHO completed; not yet reulted  Patient Profile     47 y.o. male with ESRD on dialysis admitted in transfer for [facility] following cardiac arrest in the setting of hyperkalemia.    Assessment & Plan    1. Cardiac Arrest with VR/CPR in setting of hyperkalemia requiring post resuscitation transient pressor support with levophed.  Now alert, extubated on CRRT since  presentation.  ECHO done. Stable rhythm sinus tachycardic at 102. Will check serial troponins. Pk troponin 3.63  2. CAD; diffuse 3 v CAD at R/L cath at [facility] in 03/2017 medical therapy done as part of eval for kidney transplant. Had chest pain yesterday  3. ESRD: 2 months ago started on home dialysis on M,T, Thurs, Fri,  Now on CRRT  4. Hyperkalemia and nauseated yesterday;  K 3.2 today; renal following  5. Type I DM; glu 893 on presentation but did not take meds yesterday due to nausea.   Signed, Troy Sine, MD, [facility] 09/25/2017, 9:07 AM

## 2017-09-25 NOTE — Progress Notes (Signed)
Lance [name]  NGE:[id number] DOB: Jun 11, 1970 DOA: 09/24/2017 PCP: [name], MD    Brief Narrative:   Patient is a 47 year old man was transferred from [facility] following a cardiac arrest.  He reports not having felt well over the day prior to admission with nausea vomiting and inability to eat.  He is on home dialysis and had had hypotension afterwards.  He presented to the emergency department after having developed chest pain.  Found to be hyperkalemic and while receiving treatment he suffered a brief VF cardiac arrest and achieved return of spontaneous circulation after 9 minutes.  He was intubated for airway protection.  Was found to be markedly hyperglycemic.  He was transferred to [facility] and started on CRRT.  Subjective:  This morning he was awake and following commands and passed 1/2-hour SBT and was successfully extubated.  Assessment & Plan:  -Acute hypoxic respiratory failure due to ready to protect airway.  Has now resolved.  X-ray as interpreted by myself was normal.  Patient is breathing without difficulty.   Progressive ambulation post extubation.  -Status post cardiac arrest.  Driven by hyperkalemia in the context of a hyperglycemic state.   Does not appear to to be a primary cardiac event. Pretransplant cardiac work-up performed at [facility] showed:.    reduced EF of 35%, global, mild MR by echocardiography.  His left heart cath showed an RCA lesion (70%) in the proximal to mid portion, left circumflex lesion of approximately 70% but relatively insignificant left anterior descending artery disease except for a 70 to 80% lesion in a proximal diagonal. It was felt given his lack of symptoms and the more diffuse nature of his coronary disease that medical therapy would be the best option rather than coronary bypass grafting.  He had good exercise tolerance on a recent stress test having gone 7 minutes  Continue medical management for  coronary disease.  Consider repeat investigations if chest pain symptoms recur.  -End-stage renal disease on dialysis.   Transition to conventional IHD now that extubated and blood pressure has recovered  -Hyperglycemic state without acidosis.  Likely the precipitating cause of the hyperkalemia and eventual cardiac arrest.  Blood sugars have corrected the patient has been transition from insulin infusion to subcutaneous insulin.  Hyperglycemia can also account for the nausea which the patient had previously experienced The patient may require reinforcement diabetic teaching.  DVT prophylaxis: Unfractionated subcutaneous heparin GI prophylaxis: Currently not in the Diet: Advance diet as tolerated Mobility: Ambulate ad lib. Code Status: Full code Family Communication: Patient is competent and was updated himself. Disposition Plan: If remains extubated and off pressors can transfer to stepdown.  Consultants:  Nephrology  Procedures: CRRT, mechanical ventilation.   Objective: Blood pressure 109/79, pulse 98, temperature 99.3 F (37.4 C), resp. rate (!) 27, height 6' 0.44" (1.84 m), weight 96.8 kg, SpO2 100 %. CVP:  [7 mmHg-10 mmHg] 7 mmHg  Vent Mode: CPAP;PSV FiO2 (%):  [40 %-100 %] 40 % Set Rate:  [16 bmp-24 bmp] 20 bmp Vt Set:  [580 mL] 580 mL PEEP:  [5 cmH20] 5 cmH20 Pressure Support:  [5 cmH20] 5 cmH20 Plateau Pressure:  [12 cmH20-18 cmH20] 17 cmH20   Intake/Output Summary (Last 24 hours) at 09/25/2017 1132 Last data filed at 09/25/2017 1100 Gross per 24 hour  Intake 1865.12 ml  Output 691 ml  Net 1174.12 ml   Filed Weights   09/24/17 2050  Weight: 96.8 kg  Examination: General: Well-built.  Appears stated age HENT: Mucous membranes moist.  Central line site intact Lungs: Chest clear to auscultation. Cardiovascular: 2/6 ejection murmur.  Peripheral pulses intact. Abdomen: Soft and nontender. Extremities: Palpable right brachial fistula with bruit.  Status post  well-healed right BKA. Neuro: Awake and alert.  Moves all extremities. GU: No Foley catheter in place.  CBC: Recent Labs  Lab 09/24/17 1709 09/24/17 1822 09/25/17 0450  WBC 9.7  --  13.5*  NEUTROABS 8.0*  --   --   HGB 14.5 16.0 13.5  HCT 45.2 47.0 40.2  MCV 95.2  --  90.3  PLT 148*  --  259*   Basic Metabolic Panel: Recent Labs  Lab 09/24/17 1709 09/24/17 1822 09/24/17 2222 09/25/17 0450  NA 121* 120*  --  139  K 7.3* 7.0*  --  3.2*  CL 72* 83*  --  94*  CO2 19*  --   --  29  GLUCOSE 893* >700*  --  135*  BUN 57* 65*  --  44*  CREATININE 10.32* 10.60*  --  8.35*  CALCIUM 9.4  --   --  9.9  MG  --   --  2.5* 2.3  PHOS  --   --   --  1.2*   GFR: Estimated Creatinine Clearance: 13.1 mL/min (A) (by C-G formula based on SCr of 8.35 mg/dL (H)). Recent Labs  Lab 09/24/17 1709 09/24/17 2222 09/25/17 0450  WBC 9.7  --  13.5*  LATICACIDVEN  --  6.3* 2.2*   Liver Function Tests: Recent Labs  Lab 09/25/17 0450  ALBUMIN 3.2*   No results for input(s): LIPASE, AMYLASE in the last 168 hours. No results for input(s): AMMONIA in the last 168 hours.  Coagulation Profile: Recent Labs  Lab 09/24/17 1847  INR 1.04    Cardiac Enzymes: Recent Labs  Lab 09/24/17 2222 09/25/17 0450  TROPONINI 1.76* 3.63*    HbA1C: Hgb A1c MFr Bld  Date/Time Value Ref Range Status  07/25/2017 07:19 AM 6.7 (H) 4.8 - 5.6 % Final    Comment:    (NOTE) Pre diabetes:          5.7%-6.4% Diabetes:              >6.4% Glycemic control for   <7.0% adults with diabetes   02/18/2017 02:38 PM 9.1 (H) 4.8 - 5.6 % Final    Comment:             Prediabetes: 5.7 - 6.4          Diabetes: >6.4          Glycemic control for adults with diabetes: <7.0     CBG: Recent Labs  Lab 09/25/17 0448 09/25/17 0551 09/25/17 0705 09/25/17 0804 09/25/17 1000  GLUCAP 137* 120* 111* 119* 119*      LOS: 1 day   CRITICAL CARE Performed by: Kipp Brood   Total critical care time: 45  minutes  Critical care time was exclusive of separately billable procedures and treating other patients.  Critical care was necessary to treat or prevent imminent or life-threatening deterioration.  Critical care was time spent personally by me on the following activities: development of treatment plan with patient and/or surrogate as well as nursing, discussions with consultants, evaluation of patient's response to treatment, examination of patient, obtaining history from patient or surrogate, ordering and performing treatments and interventions, ordering and review of laboratory studies, ordering and review of radiographic studies, pulse oximetry and re-evaluation  of patient's condition.  Kipp Brood, MD [facility] ICU Physician [place]  Pager: [phone] Mobile: [phone] After hours: [phone].

## 2017-09-26 DIAGNOSIS — E1029 Type 1 diabetes mellitus with other diabetic kidney complication: Secondary | ICD-10-CM | POA: Diagnosis not present

## 2017-09-26 DIAGNOSIS — E1022 Type 1 diabetes mellitus with diabetic chronic kidney disease: Secondary | ICD-10-CM

## 2017-09-26 DIAGNOSIS — N2581 Secondary hyperparathyroidism of renal origin: Secondary | ICD-10-CM | POA: Diagnosis not present

## 2017-09-26 DIAGNOSIS — D509 Iron deficiency anemia, unspecified: Secondary | ICD-10-CM | POA: Diagnosis not present

## 2017-09-26 DIAGNOSIS — N186 End stage renal disease: Secondary | ICD-10-CM | POA: Diagnosis not present

## 2017-09-26 DIAGNOSIS — K7689 Other specified diseases of liver: Secondary | ICD-10-CM | POA: Diagnosis not present

## 2017-09-26 DIAGNOSIS — D631 Anemia in chronic kidney disease: Secondary | ICD-10-CM | POA: Diagnosis not present

## 2017-09-26 LAB — COMPREHENSIVE METABOLIC PANEL
ALBUMIN: 3 g/dL — AB (ref 3.5–5.0)
ALT: 43 U/L (ref 0–44)
AST: 39 U/L (ref 15–41)
Alkaline Phosphatase: 94 U/L (ref 38–126)
Anion gap: 9 (ref 5–15)
BILIRUBIN TOTAL: 0.8 mg/dL (ref 0.3–1.2)
BUN: 20 mg/dL (ref 6–20)
CO2: 28 mmol/L (ref 22–32)
CREATININE: 5.19 mg/dL — AB (ref 0.61–1.24)
Calcium: 9 mg/dL (ref 8.9–10.3)
Chloride: 102 mmol/L (ref 98–111)
GFR calc Af Amer: 14 mL/min — ABNORMAL LOW (ref >60)
GFR calc non Af Amer: 12 mL/min — ABNORMAL LOW (ref >60)
Glucose, Bld: 104 mg/dL — ABNORMAL HIGH (ref 70–99)
POTASSIUM: 3.8 mmol/L (ref 3.5–5.1)
Sodium: 139 mmol/L (ref 135–145)
TOTAL PROTEIN: 6.9 g/dL (ref 6.5–8.1)

## 2017-09-26 LAB — GLUCOSE, CAPILLARY
GLUCOSE-CAPILLARY: 118 mg/dL — AB (ref 70–99)
Glucose-Capillary: 150 mg/dL — ABNORMAL HIGH (ref 70–99)
Glucose-Capillary: 260 mg/dL — ABNORMAL HIGH (ref 70–99)
Glucose-Capillary: 290 mg/dL — ABNORMAL HIGH (ref 70–99)
Glucose-Capillary: 62 mg/dL — ABNORMAL LOW (ref 70–99)
Glucose-Capillary: 77 mg/dL (ref 70–99)
Glucose-Capillary: 93 mg/dL (ref 70–99)

## 2017-09-26 LAB — URINALYSIS, ROUTINE W REFLEX MICROSCOPIC

## 2017-09-26 LAB — CBC
HEMATOCRIT: 39.7 % (ref 39.0–52.0)
HEMOGLOBIN: 12.6 g/dL — AB (ref 13.0–17.0)
MCH: 30.3 pg (ref 26.0–34.0)
MCHC: 31.7 g/dL (ref 30.0–36.0)
MCV: 95.4 fL (ref 78.0–100.0)
Platelets: 135 10*3/uL — ABNORMAL LOW (ref 150–400)
RBC: 4.16 MIL/uL — AB (ref 4.22–5.81)
RDW: 17.4 % — AB (ref 11.5–15.5)
WBC: 14.3 10*3/uL — AB (ref 4.0–10.5)

## 2017-09-26 LAB — URINALYSIS, MICROSCOPIC (REFLEX): RBC / HPF: >50 RBC/hpf (ref 0–5)

## 2017-09-26 LAB — MAGNESIUM: MAGNESIUM: 2.3 mg/dL (ref 1.7–2.4)

## 2017-09-26 LAB — PHOSPHORUS: Phosphorus: 3 mg/dL (ref 2.5–4.6)

## 2017-09-26 LAB — HEPARIN LEVEL (UNFRACTIONATED)
HEPARIN UNFRACTIONATED: 0.64 [IU]/mL (ref 0.30–0.70)
Heparin Unfractionated: 0.91 IU/mL — ABNORMAL HIGH (ref 0.30–0.70)

## 2017-09-26 LAB — TROPONIN I: TROPONIN I: 2.36 ng/mL — AB (ref <0.03)

## 2017-09-26 MED ORDER — OXYCODONE-ACETAMINOPHEN 5-325 MG PO TABS
1.0000 | ORAL_TABLET | Freq: Four times a day (QID) | ORAL | Status: DC | PRN
  Administered 2017-09-26 – 2017-09-29 (×7): 1 via ORAL
  Filled 2017-09-26 (×6): qty 1

## 2017-09-26 MED ORDER — CALCIUM ACETATE (PHOS BINDER) 667 MG PO CAPS: 667.0000 mg | ORAL_CAPSULE | Freq: Three times a day (TID) | ORAL | Status: DC

## 2017-09-26 MED ORDER — LISINOPRIL 40 MG PO TABS
40.0000 mg | ORAL_TABLET | Freq: Every day | ORAL | Status: DC
  Administered 2017-09-26 – 2017-09-30 (×5): 40 mg via ORAL
  Filled 2017-09-26 (×5): qty 1

## 2017-09-26 MED ORDER — CLONIDINE HCL 0.1 MG PO TABS
0.1000 mg | ORAL_TABLET | Freq: Every day | ORAL | Status: DC
  Administered 2017-09-26 – 2017-09-30 (×5): 0.1 mg via ORAL
  Filled 2017-09-26 (×5): qty 1

## 2017-09-26 MED ORDER — DOCUSATE SODIUM 100 MG PO CAPS
100.0000 mg | ORAL_CAPSULE | Freq: Two times a day (BID) | ORAL | Status: DC
  Administered 2017-09-26: 100 mg via ORAL
  Filled 2017-09-26: qty 1

## 2017-09-26 MED ORDER — CHLORHEXIDINE GLUCONATE CLOTH 2 % EX PADS: 6.0000 | MEDICATED_PAD | Freq: Every day | CUTANEOUS | Status: DC

## 2017-09-26 MED ORDER — FREESTYLE LIBRE 14 DAY SENSOR MISC: 1.0000 | Status: DC

## 2017-09-26 MED ORDER — PENTOXIFYLLINE ER 400 MG PO TBCR: 400.0000 mg | EXTENDED_RELEASE_TABLET | Freq: Three times a day (TID) | ORAL | Status: DC

## 2017-09-26 MED ORDER — ACETAMINOPHEN 500 MG PO TABS
1000.0000 mg | ORAL_TABLET | Freq: Four times a day (QID) | ORAL | Status: DC | PRN
  Administered 2017-09-27 (×2): 1000 mg via ORAL
  Filled 2017-09-26: qty 2

## 2017-09-26 MED ORDER — METHOCARBAMOL 500 MG PO TABS
500.0000 mg | ORAL_TABLET | Freq: Four times a day (QID) | ORAL | Status: DC | PRN
  Administered 2017-09-28: 500 mg via ORAL
  Filled 2017-09-26: qty 1

## 2017-09-26 MED ORDER — CARVEDILOL 3.125 MG PO TABS
3.1250 mg | ORAL_TABLET | Freq: Two times a day (BID) | ORAL | Status: DC
  Administered 2017-09-27 – 2017-09-30 (×5): 3.125 mg via ORAL
  Filled 2017-09-26 (×6): qty 1

## 2017-09-26 MED ORDER — DARBEPOETIN ALFA 60 MCG/0.3ML IJ SOSY: 60.0000 ug | PREFILLED_SYRINGE | INTRAMUSCULAR | Status: DC

## 2017-09-26 MED ORDER — GABAPENTIN 300 MG PO CAPS: 300.0000 mg | ORAL_CAPSULE | ORAL | Status: DC

## 2017-09-26 MED ORDER — CARVEDILOL 6.25 MG PO TABS
6.2500 mg | ORAL_TABLET | Freq: Two times a day (BID) | ORAL | Status: DC
  Administered 2017-09-26 (×2): 6.25 mg via ORAL
  Filled 2017-09-26 (×2): qty 1

## 2017-09-26 MED ORDER — HEPARIN SODIUM (PORCINE) 5000 UNIT/ML IJ SOLN
5000.0000 [IU] | Freq: Three times a day (TID) | INTRAMUSCULAR | Status: DC
  Administered 2017-09-26 – 2017-09-30 (×12): 5000 [IU] via SUBCUTANEOUS
  Filled 2017-09-26 (×11): qty 1

## 2017-09-26 MED ORDER — CALCITRIOL 0.25 MCG PO CAPS: 0.2500 ug | ORAL_CAPSULE | ORAL | Status: DC

## 2017-09-26 MED ORDER — GABAPENTIN 100 MG PO CAPS: 100.0000 mg | ORAL_CAPSULE | Freq: Every day | ORAL | Status: DC

## 2017-09-26 MED ORDER — CALCITRIOL 0.25 MCG PO CAPS
0.2500 ug | ORAL_CAPSULE | ORAL | Status: DC
  Administered 2017-09-29 – 2017-09-30 (×2): 0.25 ug via ORAL
  Filled 2017-09-26: qty 1

## 2017-09-26 MED ORDER — FREESTYLE LIBRE 14 DAY READER DEVI: 1.0000 | Status: DC

## 2017-09-26 MED ORDER — SUCROFERRIC OXYHYDROXIDE 500 MG PO CHEW
1500.0000 mg | CHEWABLE_TABLET | Freq: Three times a day (TID) | ORAL | Status: DC
  Administered 2017-09-26 – 2017-09-30 (×6): 1500 mg via ORAL
  Filled 2017-09-26 (×14): qty 3

## 2017-09-26 MED ORDER — ASPIRIN EC 325 MG PO TBEC
325.0000 mg | DELAYED_RELEASE_TABLET | Freq: Every day | ORAL | Status: DC
  Administered 2017-09-27 – 2017-09-30 (×4): 325 mg via ORAL
  Filled 2017-09-26 (×4): qty 1

## 2017-09-26 NOTE — Progress Notes (Signed)
Assessment/Plan: 1. ESRD - home HD MTTHF NxStage 3.5hr through rt BCF.             Begin IHD, I have requested new home HD equipment and technical eval of old.  Plan HD in AM.  Will get cath out in 1 to 2 days. 2. Uncontrolled T1DM: on insulin  3. HTN: more stable 4. PAD - s/p recent R BKA  5. S/p cardiac arrest 6. Hyperkalemia, resolved.Marland Kitchen Possibly related to equipment malfunction or operator error  Subjective: Interval History: Wife reports some issue with dialysate preparation at home.  Ks  Previously were ok  Objective: Vital signs in last 24 hours: Temp:  [98.6 F (37 C)-99.1 F (37.3 C)] 98.6 F (37 C) (08/16 0440) Pulse Rate:  [96-122] 99 (08/16 1000) Resp:  [12-30] 15 (08/16 1000) BP: (70-178)/(44-85) 135/76 (08/16 1000) SpO2:  [96 %-100 %] 100 % (08/16 1000) Weight:  [100.2 kg] 100.2 kg (08/16 0500) Weight change: 3.4 kg  Intake/Output from previous day: 08/15 0701 - 08/16 0700 In: 2312.8 [P.O.:180; I.V.:2002.8] Out: 1402 [Urine:132] Intake/Output this shift: Total I/O In: 481.4 [P.O.:120; I.V.:361.4] Out: 29 [Urine:10; Other:73]  General appearance: alert and cooperative Head: Normocephalic, without obvious abnormality, atraumatic Resp: clear to auscultation bilaterally Cardio: regular rate and rhythm, S1, S2 normal, no murmur, click, rub or gallop Extremities: right BKA, RUE AVF, right neck HD cath  Lab Results: Recent Labs    09/25/17 0450 09/26/17 0350  WBC 13.5* 14.3*  HGB 13.5 12.6*  HCT 40.2 39.7  PLT 141* 135*   BMET:  Recent Labs    09/25/17 1743 09/26/17 0350  NA 134* 139  K 4.1 3.8  CL 99 102  CO2 25 28  GLUCOSE 305* 104*  BUN 27* 20  CREATININE 6.16* 5.19*  CALCIUM 8.4* 9.0   No results for input(s): PTH in the last 72 hours. Iron Studies: No results for input(s): IRON, TIBC, TRANSFERRIN, FERRITIN in the last 72 hours. Studies/Results: Dg Chest Port 1 View  Result Date: 09/24/2017 CLINICAL DATA:  Check endotracheal tube  placement EXAM: PORTABLE CHEST 1 VIEW COMPARISON:  Film from earlier in the same day. FINDINGS: Endotracheal tube and nasogastric catheter are again seen and stable. Right jugular central line is noted at the cavoatrial junction. No pneumothorax is seen. Right subclavian vein stent is noted. The lungs are well aerated bilaterally. IMPRESSION: No pneumothorax following central line placement. Electronically Signed   By: [name] M.D.   On: 09/24/2017 23:09   Dg Chest Port 1 View  Result Date: 09/24/2017 CLINICAL DATA:  Initial evaluation for acute chest pain, status post CPR. EXAM: PORTABLE CHEST 1 VIEW COMPARISON:  Prior radiograph from 08/07/2017 FINDINGS: Endotracheal tube in place with tip positioned 3.9 cm above the carina. Enteric tube courses into the abdomen. Defibrillator pads overlie the left chest. Moderate cardiomegaly, stable.  Mediastinal silhouette normal. Lungs mildly hypoinflated. Diffuse vascular and interstitial prominence consistent with mild diffuse pulmonary interstitial edema. No focal infiltrates. No pleural effusion. No pneumothorax. No acute osseous abnormality. Vascular stent overlies the distal right subclavian region. IMPRESSION: 1. Support apparatus in satisfactory position as above. 2. Cardiomegaly with mild diffuse pulmonary interstitial edema. Electronically Signed   By: Jeannine Boga M.D.   On: 09/24/2017 18:44    Scheduled: . aspirin  81 mg Per Tube Daily  . carvedilol  6.25 mg Oral BID WC  . Chlorhexidine Gluconate Cloth  6 each Topical Daily  . heparin injection (subcutaneous)  5,000 Units Subcutaneous Q8H  .  insulin aspart  0-24 Units Subcutaneous Q4H  . insulin glargine  25 Units Subcutaneous Daily  . pantoprazole  40 mg Oral Daily  . sodium chloride flush  10-40 mL Intravenous Q12H     LOS: 2 days   Estanislado Emms 09/26/2017,11:41 AM

## 2017-09-26 NOTE — Progress Notes (Signed)
Progress Note  Patient Name: Lance [name] Date of Encounter: 09/26/2017  Primary Cardiologist:   Subjective   C/O chest wall soreness from CPR  Inpatient Medications    Scheduled Meds: . aspirin  81 mg Per Tube Daily  . Chlorhexidine Gluconate Cloth  6 each Topical Daily  . insulin aspart  0-24 Units Subcutaneous Q4H  . insulin glargine  25 Units Subcutaneous Daily  . mouth rinse  15 mL Mouth Rinse BID  . pantoprazole  40 mg Oral Daily  . sodium chloride flush  10-40 mL Intravenous Q12H   Continuous Infusions: . sodium chloride 50 mL/hr at 09/26/17 0800  . heparin 1,050 Units/hr (09/26/17 0800)  . norepinephrine (LEVOPHED) Adult infusion Stopped (09/25/17 2009)  . dialysis replacement fluid (prismasate) 500 mL/hr at 09/26/17 0653  . dialysis replacement fluid (prismasate) 300 mL/hr at 09/26/17 0144  . dialysate (PRISMASATE) 1,500 mL/hr at 09/26/17 0653  . sodium chloride     PRN Meds: fentaNYL (SUBLIMAZE) injection, heparin, sodium chloride, traMADol   Vital Signs    Vitals:   09/26/17 0600 09/26/17 0715 09/26/17 0800 09/26/17 0815  BP: 123/81  139/83   Pulse: (!) 104 (!) 103 (!) 105 (!) 117  Resp: 16 16 15 13   Temp:      TempSrc:      SpO2: 98% 98% 99% 100%  Weight:      Height:        Intake/Output Summary (Last 24 hours) at 09/26/2017 0851 Last data filed at 09/26/2017 0803 Gross per 24 hour  Intake 2422.29 ml  Output 1392 ml  Net 1030.29 ml    I/O since admission: +1832  Filed Weights   09/24/17 2050 09/26/17 0500  Weight: 96.8 kg 100.2 kg    Telemetry    Sinus tachycardia 102- Personally Reviewed  ECG    ECG (independently read by me): ST at 100; LVH with repolarization, increased QTc at 528 msec  Physical Exam     BP 139/83 (BP Location: Left Arm)   Pulse (!) 117   Temp 98.6 F (37 C) (Oral)   Resp 13   Ht 6' 0.44" (1.84 m)   Wt 100.2 kg   SpO2 100%   BMI 29.60 kg/m  General: Alert, oriented, no distress.  Skin: normal  turgor, no rashes, warm and dry HEENT: Normocephalic, atraumatic. Pupils equal round and reactive to light; sclera anicteric; extraocular muscles intact;  Nose without nasal septal hypertrophy Mouth/Parynx benign; Mallinpatti scale 3 Neck: No JVD, no carotid bruits; normal carotid upstroke Lungs: clear to ausculatation and percussion; no wheezing or rales Chest wall: without tenderness to palpitation Heart: PMI not displaced, RRR, s1 s2 normal, 1/6 systolic murmur, no diastolic murmur, no rubs, gallops, thrills, or heaves Abdomen: soft, nontender; no hepatosplenomehaly, BS+; abdominal aorta nontender and not dilated by palpation. Back: no CVA tenderness Pulses 2+ Musculoskeletal: full range of motion, normal strength, no joint deformities Extremities: R BKA; no clubbing cyanosis or edema, Homan's sign negative  Neurologic: grossly nonfocal; Cranial nerves grossly wnl Psychologic: Normal mood and affect    Labs    Chemistry Recent Labs  Lab 09/25/17 0450 09/25/17 1743 09/26/17 0350  NA 139 134* 139  K 3.2* 4.1 3.8  CL 94* 99 102  CO2 29 25 28   GLUCOSE 135* 305* 104*  BUN 44* 27* 20  CREATININE 8.35* 6.16* 5.19*  CALCIUM 9.9 8.4* 9.0  PROT  --   --  6.9  ALBUMIN 3.2* 3.0* 3.0*  AST  --   --  39  ALT  --   --  43  ALKPHOS  --   --  94  BILITOT  --   --  0.8  GFRNONAA 7* 10* 12*  GFRAA 8* 11* 14*  ANIONGAP 16* 10 9     Hematology Recent Labs  Lab 09/24/17 1709 09/24/17 1822 09/25/17 0450 09/26/17 0350  WBC 9.7  --  13.5* 14.3*  RBC 4.75  --  4.45 4.16*  HGB 14.5 16.0 13.5 12.6*  HCT 45.2 47.0 40.2 39.7  MCV 95.2  --  90.3 95.4  MCH 30.5  --  30.3 30.3  MCHC 32.1  --  33.6 31.7  RDW 17.2*  --  16.7* 17.4*  PLT 148*  --  141* 135*    Cardiac Enzymes Recent Labs  Lab 09/24/17 2222 09/25/17 0450 09/25/17 1420 09/26/17 0033  TROPONINI 1.76* 3.63* 3.93* 2.36*    Recent Labs  Lab 09/24/17 1727  TROPIPOC 0.16*     BNPNo results for input(s): BNP,  PROBNP in the last 168 hours.   DDimer No results for input(s): DDIMER in the last 168 hours.   Lipid Panel     Component Value Date/Time   CHOL 174 12/28/2013 0707   TRIG 102 09/25/2017 0450   HDL 39 (L) 12/28/2013 0707   CHOLHDL 4.5 12/28/2013 0707   VLDL 46 (H) 12/28/2013 0707   LDLCALC 89 12/28/2013 0707    Radiology    Dg Chest Port 1 View  Result Date: 09/24/2017 CLINICAL DATA:  Check endotracheal tube placement EXAM: PORTABLE CHEST 1 VIEW COMPARISON:  Film from earlier in the same day. FINDINGS: Endotracheal tube and nasogastric catheter are again seen and stable. Right jugular central line is noted at the cavoatrial junction. No pneumothorax is seen. Right subclavian vein stent is noted. The lungs are well aerated bilaterally. IMPRESSION: No pneumothorax following central line placement. Electronically Signed   By: [name] M.D.   On: 09/24/2017 23:09   Dg Chest Port 1 View  Result Date: 09/24/2017 CLINICAL DATA:  Initial evaluation for acute chest pain, status post CPR. EXAM: PORTABLE CHEST 1 VIEW COMPARISON:  Prior radiograph from 08/07/2017 FINDINGS: Endotracheal tube in place with tip positioned 3.9 cm above the carina. Enteric tube courses into the abdomen. Defibrillator pads overlie the left chest. Moderate cardiomegaly, stable.  Mediastinal silhouette normal. Lungs mildly hypoinflated. Diffuse vascular and interstitial prominence consistent with mild diffuse pulmonary interstitial edema. No focal infiltrates. No pleural effusion. No pneumothorax. No acute osseous abnormality. Vascular stent overlies the distal right subclavian region. IMPRESSION: 1. Support apparatus in satisfactory position as above. 2. Cardiomegaly with mild diffuse pulmonary interstitial edema. Electronically Signed   By: Jeannine Boga M.D.   On: 09/24/2017 18:44    Cardiac Studies  ------------------------------------------------------------------- ECHO Study Conclusions  - Left ventricle:  The cavity size was normal. There was moderate   concentric hypertrophy. Systolic function was normal. The   estimated ejection fraction was in the range of 50% to 55%. Wall   motion was normal; there were no regional wall motion   abnormalities. Doppler parameters are consistent with abnormal   left ventricular relaxation (grade 1 diastolic dysfunction).   Doppler parameters are consistent with elevated ventricular   end-diastolic filling pressure. - Aortic valve: Trileaflet; severely thickened, severely calcified   leaflets. Valve mobility was restricted. There was mild stenosis. - Mitral valve: Calcified annulus. Mildly thickened leaflets . - Right ventricle: The cavity size was moderately dilated. Wall   thickness was normal.  Systolic function was moderately reduced. - Tricuspid valve: There was mild regurgitation. - Pulmonary arteries: Systolic pressure was within the normal   range. - Inferior vena cava: The vessel was normal in size. - Pericardium, extracardiac: There was no pericardial effusion.   Patient Profile     47 y.o. male with ESRD on dialysis admitted in transfer for [facility] following cardiac arrest in the setting of hyperkalemia.    Assessment & Plan    1. Day 2 Cardiac Arrest with VF/CPR in setting of hyperkalemia requiring post resuscitation transient pressor support with levophed.  Alert, extubated on CRRT since presentation.   Stable rhythm sinus tachycardic at 102. Will check serial troponins. Pk troponin 3.3 trending down.  Significant chest wall tenderness from CPR. Sinus tachycardia with BP in the 130s;  Will resume carvedilol 6.25 mg bid. Echo shows return of normal LV FXN with EF 50 - 55%. (Reportedly had gotten low and pt was notified on Tuesday that because of decrease, he was taken off the transplant list.   2. CAD; diffuse 3 v CAD at R/L cath at [facility] in 03/2017 medical therapy done as part of eval for kidney transplant.  Will resume carvedilol.   3. ESRD: 2  months ago started on home dialysis on M,T, Thurs, Fri,  Now on CRRT  4. Hyperkalemia and nauseated yesterday;  K today 3.8  5. Type I DM; glu 893 on presentation;  Improved at 104 today    Signed, Troy Sine, MD, [facility] 09/26/2017, 8:51 AM

## 2017-09-26 NOTE — Progress Notes (Signed)
Patient transferred to room 5M19 from 2H06. Patient is alert and oriented x 4. No signs of distress. Will continue to monitor. Dorthey Sawyer, RN

## 2017-09-26 NOTE — Progress Notes (Signed)
[place] for Heparin  Indication: chest pain/ACS  Allergies  Allergen Reactions  . Atorvastatin Other (See Comments)    Muscle pain   . Minoxidil Other (See Comments)    Fluid retention   . Adhesive [Tape] Other (See Comments)    MUST USE "SILK TAPE," AS ANY OTHER "BURNS" THE SKIN  . Statins Other (See Comments)    Muscle pain    Patient Measurements: Height: 6' 0.44" (184 cm) Weight: 213 lb 6.5 oz (96.8 kg) IBW/kg (Calculated) : 78.61  Vital Signs: Temp: 98.6 F (37 C) (08/16 0005) Temp Source: Oral (08/16 0005) BP: 136/73 (08/16 0130) Pulse Rate: 110 (08/16 0130)  Labs: Recent Labs    09/24/17 1709 09/24/17 1822 09/24/17 1847 09/24/17 2222 09/25/17 0450 09/25/17 1300 09/25/17 1420 09/25/17 1743 09/26/17 0033  HGB 14.5 16.0  --   --  13.5  --   --   --   --   HCT 45.2 47.0  --   --  40.2  --   --   --   --   PLT 148*  --   --   --  141*  --   --   --   --   APTT  --   --  28  --   --   --   --   --   --   LABPROT  --   --  13.5  --   --   --   --   --   --   INR  --   --  1.04  --   --   --   --   --   --   HEPARINUNFRC  --   --   --   --   --  0.79*  --   --  0.91*  CREATININE 10.32* 10.60*  --   --  8.35*  --   --  6.16*  --   TROPONINI  --   --   --  1.76* 3.63*  --  3.93*  --   --     Estimated Creatinine Clearance: 17.8 mL/min (A) (by C-G formula based on SCr of 6.16 mg/dL (H)).   Medical History: Past Medical History:  Diagnosis Date  . Aortic stenosis    Very mild in 05/2007  . Arthritis    "right shoulder; right knee; feel like I've got it all over" (11/28/2015)  . CHF (congestive heart failure) ([place])   . Degenerative joint disease    Left TKA  . Diastolic heart failure (HCC)    LVEF 65-70% with grade 2 diastolic dysfunction  . ESRD (end stage renal disease) on dialysis [facility])    (as of 09/24/2017) pt does HD at home on a M/Tu/Th/F schedule.  . Essential hypertension, benign 2000   LVH  . GERD  (gastroesophageal reflux disease)   . HCAP (healthcare-associated pneumonia) 11/28/2015  . Hyperlipidemia 2000  . Loculated pleural effusion 11/28/2015   [name] 11/28/2015  . Pneumonia 12/2013; 05/2014  . PSVT (paroxysmal supraventricular tachycardia) (HCC)    Nonsustained; asymptomatic; diagnosed by event recorder in 2006  . Tobacco abuse, in remission    20 pack years; discontinued in 1985; bronchitic changes on chest x-ray and 2009  . Type 1 diabetes mellitus ([place]) 1990    Assessment: 46 y/o M s/p cardiac arrest thought to be due to hyperkalemia, troponin is elevated, starting heparin for now, CBC ok, ESRD on HD  8/16 AM update: heparin level remains high despite rate decrease, no current issues per RN.   Goal of Therapy:  Heparin level 0.3-0.7 units/ml Monitor platelets by anticoagulation protocol: Yes   Plan:  -Dec heparin to 1050 units/hr -1000 HL -Daily CBC  Narda Bonds 09/26/2017,2:04 AM

## 2017-09-26 NOTE — Progress Notes (Signed)
Inpatient Diabetes Program Recommendations  AACE/ADA: New Consensus Statement on Inpatient Glycemic Control (2019)  Target Ranges:  Prepandial:   less than 140 mg/dL      Peak postprandial:   less than 180 mg/dL (1-2 hours)      Critically ill patients:  140 - 180 mg/dL  Results for Lance [name], Lance [name] (MRN [id number]) as of 09/26/2017 07:56  Ref. Range 09/25/2017 08:04 09/25/2017 10:00 09/25/2017 11:53 09/25/2017 15:47 09/25/2017 20:30 09/25/2017 23:50 09/26/2017 04:28 09/26/2017 07:36  Glucose-Capillary Latest Ref Range: 70 - 99 mg/dL 119 (H) 119 (H) 172 (H) 229 (H) 263 (H) 150 (H) 93 62 (L)    Results for [name], [name] (MRN [id number]) as of 09/25/2017 07:45  Ref. Range 02/18/2017 14:38 07/25/2017 07:19  Hemoglobin A1C Latest Ref Range: 4.8 - 5.6 % 9.1 (H) 6.7 (H)   Review of Glycemic Control  Diabetes history: DM1 (makes no insulin; requires basal, correction, and meal coverage insulin) Outpatient Diabetes medications: Lantus 25 units QHS, Humalog 15-20 units TID Current orders for Inpatient glycemic control: Lantus 25 units QHS, Novolog 0-24 units Q4H  Inpatient Diabetes Program Recommendations: Insulin - Basal: Noted glucose of 62 mg/dl this morning. Hypoglycemia likely related to Novolog correction scale. Do NOT recommend changing Lantus dose (please continue as ordered). Correction (SSI):Patient is sensitive to insulin. Please discontinue Novolog 0-24 units Q4H and order Novolog 0-9 units Q4H. Insulin-Meal Coverage: Once diet is advanced,  patient will need Novolog meal coverage ordered to cover carbohydrates (recommend Novolog 3 units TID with meals for meal coverage). HgbA1C: Last A1C was 6.7% on 07/25/2017. Patient has been inpatient 4 times in past 6 months and was last seen by Diabetes Coordinator on 06/09/2017.  NOTE: Patient has Type 1 DM (makes no insulin) and is followed by Dr. Dorris Fetch for DM control. Per chart, patient last seen Dr. Dorris Fetch on 07/23/17 and is prescribed Lantus 25  units QHS, Humalog 15 units with breakfast and lunch, Humalog 10 units with supper.   Thanks, [name], RN, MSN, CDE Diabetes Coordinator Inpatient Diabetes Program [phone] (Team Pager from 8am to 5pm)

## 2017-09-26 NOTE — Progress Notes (Addendum)
ANTICOAGULATION CONSULT NOTE - Initial Consult  Pharmacy Consult for Heparin  Indication: chest pain/ACS  Allergies  Allergen Reactions  . Atorvastatin Other (See Comments)    Muscle pain   . Minoxidil Other (See Comments)    Fluid retention   . Adhesive [Tape] Other (See Comments)    MUST USE "SILK TAPE," AS ANY OTHER "BURNS" THE SKIN  . Statins Other (See Comments)    Muscle pain    Patient Measurements: Height: 6' 0.44" (184 cm) Weight: 220 lb 14.4 oz (100.2 kg) IBW/kg (Calculated) : 78.61  Vital Signs: Temp: 98.6 F (37 C) (08/16 0440) Temp Source: Oral (08/16 0440) BP: 135/76 (08/16 1000) Pulse Rate: 99 (08/16 1000)  Labs: Recent Labs    09/24/17 1709 09/24/17 1822 09/24/17 1847  09/25/17 0450 09/25/17 1300 09/25/17 1420 09/25/17 1743 09/26/17 0033 09/26/17 0350 09/26/17 0923  HGB 14.5 16.0  --   --  13.5  --   --   --   --  12.6*  --   HCT 45.2 47.0  --   --  40.2  --   --   --   --  39.7  --   PLT 148*  --   --   --  141*  --   --   --   --  135*  --   APTT  --   --  28  --   --   --   --   --   --   --   --   LABPROT  --   --  13.5  --   --   --   --   --   --   --   --   INR  --   --  1.04  --   --   --   --   --   --   --   --   HEPARINUNFRC  --   --   --   --   --  0.79*  --   --  0.91*  --  0.64  CREATININE 10.32* 10.60*  --   --  8.35*  --   --  6.16*  --  5.19*  --   TROPONINI  --   --   --    < > 3.63*  --  3.93*  --  2.36*  --   --    < > = values in this interval not displayed.    Estimated Creatinine Clearance: 21.5 mL/min (A) (by C-G formula based on SCr of 5.19 mg/dL (H)).   Medical History: Past Medical History:  Diagnosis Date  . Aortic stenosis    Very mild in 05/2007  . Arthritis    "right shoulder; right knee; feel like I've got it all over" (11/28/2015)  . CHF (congestive heart failure) ([place])   . Degenerative joint disease    Left TKA  . Diastolic heart failure (HCC)    LVEF 65-70% with grade 2 diastolic dysfunction  .  ESRD (end stage renal disease) on dialysis [facility])    (as of 09/24/2017) pt does HD at home on a M/Tu/Th/F schedule.  . Essential hypertension, benign 2000   LVH  . GERD (gastroesophageal reflux disease)   . HCAP (healthcare-associated pneumonia) 11/28/2015  . Hyperlipidemia 2000  . Loculated pleural effusion 11/28/2015   [name] 11/28/2015  . Pneumonia 12/2013; 05/2014  . PSVT (paroxysmal supraventricular tachycardia) (HCC)    Nonsustained; asymptomatic; diagnosed by event recorder  in 2006  . Tobacco abuse, in remission    20 pack years; discontinued in 1985; bronchitic changes on chest x-ray and 2009  . Type 1 diabetes mellitus ([place]) 1990    Assessment: 47 y/o M s/p cardiac arrest thought to be due to hyperkalemia, troponin is elevated at 2.36, CBC ok, ESRD on CRRT  -Heparin level at therapeutic goal with HL of 0.64 on 1050 units/hr. -Hgb decreased slightly to 12.6, patient w/ mild bleeding at CRRT catheter site according to nursing.  Goal of Therapy:  Heparin level 0.3-0.7 units/ml Monitor platelets by anticoagulation protocol: Yes   Plan:  -Continue current dose of heparin at 1050 units/hr -Daily CBC/HL, monitor platelets -Monitor for bleeding  Donnal Debar PharmD Candidate 09/26/2017,10:33 AM      Agree with above assessment. Heparin therapeutic on current rate of 1050 units/hr. Will continue at current rate and monitor with daily levels.   Claiborne Billings, PharmD PGY2 Cardiology Pharmacy Resident Phone [phone] 09/26/2017 10:46 AM

## 2017-09-26 NOTE — Progress Notes (Signed)
Stopped CRRT per Dr Florene Glen. Recirculated blood, and heparin locked catheter. RN will continue to monitor.

## 2017-09-26 NOTE — Progress Notes (Signed)
Lance [name]  HBZ:[id number] DOB: [birth year] DOA: 09/24/2017 PCP: [name], MD    Brief Narrative:   Lance [name] is a 47 year old man was transferred from [facility] following a cardiac arrest.  He reports not having felt well over the day prior to admission with nausea vomiting and inability to eat.  He is on home dialysis and had had hypotension afterwards.  He presented to the emergency department after having developed chest pain.  Found to be hyperkalemic and while receiving treatment he suffered a brief VF cardiac arrest and achieved return of spontaneous circulation after 9 minutes.  He was intubated for airway protection.  Was found to be markedly hyperglycemic.  He was transferred to [facility] and started on CRRT.  He was successfully extubated 8/15 and is neurologically intact. BP has recovered and vasopressors have been discontinued.  Subjective:  Now off CRRT. Lance [name] has no complaints. Cannot ascertain why he decompensated. Nausea has now resolved.  Assessment & Plan:  -Acute hypoxic respiratory failure due to ready to protect airway.  Has now resolved.  Currently on room air. Mobilize as able given R BKA.  -Status post cardiac arrest.  Driven by hyperkalemia in the context of a hyperglycemic state.   Does not appear to to be a primary cardiac event. Pretransplant cardiac work-up performed at [facility] showed:.    reduced EF of 35%, global, mild MR by echocardiography.  His left heart cath showed an RCA lesion (70%) in the proximal to mid portion, left circumflex lesion of approximately 70% but relatively insignificant left anterior descending artery disease except for a 70 to 80% lesion in a proximal diagonal. It was felt given his lack of symptoms and the more diffuse nature of his coronary disease that medical therapy would be the best option rather than coronary bypass grafting.  He had good exercise tolerance on a recent stress test having  gone 7 minutes.  Continue medical management for coronary disease.  Consider repeat investigations if chest pain symptoms recur.  -End-stage renal disease on dialysis.   For conventional HD as per Nephrology.  -Hyperglycemic state without acidosis.  Blood sugar control now acceptable on home insulin regimen.  Lance [name] reports good control at home.   DVT prophylaxis: Unfractionated subcutaneous heparin GI prophylaxis: not indicated. Diet: Advance diet as tolerated Mobility: Ambulate ad lib. Code Status: Full code Family Communication: Lance [name] is competent and was updated himself. Disposition Plan: Ready for transfer to floor.  Consultants:  Nephrology  Procedures: CRRT, mechanical ventilation.   Objective: Blood pressure 135/76, pulse 99, temperature 98.6 F (37 C), temperature source Oral, resp. rate 15, height 6' 0.44" (1.84 m), weight 100.2 kg, SpO2 100 %. CVP:  [2 mmHg-5 mmHg] 5 mmHg      Intake/Output Summary (Last 24 hours) at 09/26/2017 1059 Last data filed at 09/26/2017 1000 Gross per 24 hour  Intake 2018.97 ml  Output 1329 ml  Net 689.97 ml   Filed Weights   09/24/17 2050 09/26/17 0500  Weight: 96.8 kg 100.2 kg    Examination: General: Well-built.  Appears stated age HENT: Mucous membranes moist.  Central line site intact Lungs: Chest clear to auscultation. Cardiovascular: 2/6 ejection murmur.  Peripheral pulses intact. Abdomen: Soft and nontender. Extremities: Palpable right brachial fistula with bruit.  Status post well-healed right BKA. Neuro: Awake and alert.  Moves all extremities. GU: No Foley catheter in place.  CBC: Recent Labs  Lab 09/24/17 1709 09/24/17 1822 09/25/17 0450 09/26/17  0350  WBC 9.7  --  13.5* 14.3*  NEUTROABS 8.0*  --   --   --   HGB 14.5 16.0 13.5 12.6*  HCT 45.2 47.0 40.2 39.7  MCV 95.2  --  90.3 95.4  PLT 148*  --  141* 818*   Basic Metabolic Panel: Recent Labs  Lab 09/24/17 1709 09/24/17 1822 09/24/17 2222  09/25/17 0450 09/25/17 1743 09/26/17 0350  NA 121* 120*  --  139 134* 139  K 7.3* 7.0*  --  3.2* 4.1 3.8  CL 72* 83*  --  94* 99 102  CO2 19*  --   --  29 25 28   GLUCOSE 893* >700*  --  135* 305* 104*  BUN 57* 65*  --  44* 27* 20  CREATININE 10.32* 10.60*  --  8.35* 6.16* 5.19*  CALCIUM 9.4  --   --  9.9 8.4* 9.0  MG  --   --  2.5* 2.3  --  2.3  PHOS  --   --   --  1.2* 2.4* 3.0   GFR: Estimated Creatinine Clearance: 21.5 mL/min (A) (by C-G formula based on SCr of 5.19 mg/dL (H)). Recent Labs  Lab 09/24/17 1709 09/24/17 2222 09/25/17 0450 09/26/17 0350  WBC 9.7  --  13.5* 14.3*  LATICACIDVEN  --  6.3* 2.2*  --    Liver Function Tests: Recent Labs  Lab 09/25/17 0450 09/25/17 1743 09/26/17 0350  AST  --   --  39  ALT  --   --  43  ALKPHOS  --   --  94  BILITOT  --   --  0.8  PROT  --   --  6.9  ALBUMIN 3.2* 3.0* 3.0*   No results for input(s): LIPASE, AMYLASE in the last 168 hours. No results for input(s): AMMONIA in the last 168 hours.  Coagulation Profile: Recent Labs  Lab 09/24/17 1847  INR 1.04    Cardiac Enzymes: Recent Labs  Lab 09/24/17 2222 09/25/17 0450 09/25/17 1420 09/26/17 0033  TROPONINI 1.76* 3.63* 3.93* 2.36*    HbA1C: Hgb A1c MFr Bld  Date/Time Value Ref Range Status  07/25/2017 07:19 AM 6.7 (H) 4.8 - 5.6 % Final    Comment:    (NOTE) Pre diabetes:          5.7%-6.4% Diabetes:              >6.4% Glycemic control for   <7.0% adults with diabetes   02/18/2017 02:38 PM 9.1 (H) 4.8 - 5.6 % Final    Comment:             Prediabetes: 5.7 - 6.4          Diabetes: >6.4          Glycemic control for adults with diabetes: <7.0     CBG: Recent Labs  Lab 09/25/17 2030 09/25/17 2350 09/26/17 0428 09/26/17 0736 09/26/17 0809  GLUCAP 263* 150* 93 62* 77      LOS: 2 days   CRITICAL CARE Performed by: Kipp Brood   Total critical care time: 45 minutes  Critical care time was exclusive of separately billable procedures  and treating other patients.  Critical care was necessary to treat or prevent imminent or life-threatening deterioration.  Critical care was time spent personally by me on the following activities: development of treatment plan with Lance [name] and/or surrogate as well as nursing, discussions with consultants, evaluation of Lance [name]'s response to treatment, examination of Lance [name], obtaining history  from Lance [name] or surrogate, ordering and performing treatments and interventions, ordering and review of laboratory studies, ordering and review of radiographic studies, pulse oximetry and re-evaluation of Lance [name]'s condition.  Kipp Brood, MD [facility] ICU Physician [name]  Pager: [phone] Mobile: [phone] After hours: [phone].

## 2017-09-26 NOTE — Progress Notes (Signed)
Pt HD cath site oozing, RN redressed site per protocol and will continue to monitor.

## 2017-09-27 LAB — RENAL FUNCTION PANEL
Albumin: 2.6 g/dL — ABNORMAL LOW (ref 3.5–5.0)
Anion gap: 8 (ref 5–15)
BUN: 36 mg/dL — ABNORMAL HIGH (ref 6–20)
CHLORIDE: 99 mmol/L (ref 98–111)
CO2: 24 mmol/L (ref 22–32)
CREATININE: 8.21 mg/dL — AB (ref 0.61–1.24)
Calcium: 8.3 mg/dL — ABNORMAL LOW (ref 8.9–10.3)
GFR calc Af Amer: 8 mL/min — ABNORMAL LOW (ref >60)
GFR calc non Af Amer: 7 mL/min — ABNORMAL LOW (ref >60)
Glucose, Bld: 196 mg/dL — ABNORMAL HIGH (ref 70–99)
Phosphorus: 3.6 mg/dL (ref 2.5–4.6)
Potassium: 4 mmol/L (ref 3.5–5.1)
Sodium: 131 mmol/L — ABNORMAL LOW (ref 135–145)

## 2017-09-27 LAB — CBC
HCT: 35.5 % — ABNORMAL LOW (ref 39.0–52.0)
Hemoglobin: 11.3 g/dL — ABNORMAL LOW (ref 13.0–17.0)
MCH: 30.5 pg (ref 26.0–34.0)
MCHC: 31.8 g/dL (ref 30.0–36.0)
MCV: 95.9 fL (ref 78.0–100.0)
PLATELETS: 115 10*3/uL — AB (ref 150–400)
RBC: 3.7 MIL/uL — AB (ref 4.22–5.81)
RDW: 17.1 % — ABNORMAL HIGH (ref 11.5–15.5)
WBC: 10.1 10*3/uL (ref 4.0–10.5)

## 2017-09-27 LAB — GLUCOSE, CAPILLARY
GLUCOSE-CAPILLARY: 46 mg/dL — AB (ref 70–99)
GLUCOSE-CAPILLARY: 179 mg/dL — AB (ref 70–99)
GLUCOSE-CAPILLARY: 162 mg/dL — AB (ref 70–99)
GLUCOSE-CAPILLARY: 184 mg/dL — AB (ref 70–99)
Glucose-Capillary: 232 mg/dL — ABNORMAL HIGH (ref 70–99)
Glucose-Capillary: 247 mg/dL — ABNORMAL HIGH (ref 70–99)
Glucose-Capillary: 36 mg/dL — CL (ref 70–99)
Glucose-Capillary: 86 mg/dL (ref 70–99)

## 2017-09-27 LAB — MAGNESIUM: MAGNESIUM: 2.3 mg/dL (ref 1.7–2.4)

## 2017-09-27 MED ORDER — TRAMADOL HCL 50 MG PO TABS
ORAL_TABLET | ORAL | Status: AC
  Filled 2017-09-27: qty 2

## 2017-09-27 MED ORDER — PSYLLIUM 95 % PO PACK
1.0000 | PACK | Freq: Every day | ORAL | Status: DC
  Administered 2017-09-27 – 2017-09-30 (×4): 1 via ORAL
  Filled 2017-09-27 (×4): qty 1

## 2017-09-27 MED ORDER — SODIUM CHLORIDE 0.9 % IV SOLN: 100.0000 mL | INTRAVENOUS | Status: DC | PRN

## 2017-09-27 MED ORDER — SENNOSIDES-DOCUSATE SODIUM 8.6-50 MG PO TABS
2.0000 | ORAL_TABLET | Freq: Two times a day (BID) | ORAL | Status: DC
  Administered 2017-09-27 – 2017-09-30 (×7): 2 via ORAL
  Filled 2017-09-27 (×7): qty 2

## 2017-09-27 MED ORDER — LIDOCAINE-PRILOCAINE 2.5-2.5 % EX CREA: 1.0000 "application " | TOPICAL_CREAM | CUTANEOUS | Status: DC | PRN

## 2017-09-27 MED ORDER — ACETAMINOPHEN 500 MG PO TABS
ORAL_TABLET | ORAL | Status: AC
  Filled 2017-09-27: qty 2

## 2017-09-27 MED ORDER — PENTAFLUOROPROP-TETRAFLUOROETH EX AERO: 1.0000 "application " | INHALATION_SPRAY | CUTANEOUS | Status: DC | PRN

## 2017-09-27 MED ORDER — LIDOCAINE HCL (PF) 1 % IJ SOLN: 5.0000 mL | INTRAMUSCULAR | Status: DC | PRN

## 2017-09-27 MED ORDER — HEPARIN SODIUM (PORCINE) 1000 UNIT/ML DIALYSIS: 1000.0000 [IU] | INTRAMUSCULAR | Status: DC | PRN

## 2017-09-27 MED ORDER — HEPARIN SODIUM (PORCINE) 1000 UNIT/ML DIALYSIS
20.0000 [IU]/kg | INTRAMUSCULAR | Status: DC | PRN
  Administered 2017-09-27: 2000 [IU] via INTRAVENOUS_CENTRAL
  Filled 2017-09-27: qty 2

## 2017-09-27 MED ORDER — ALTEPLASE 2 MG IJ SOLR: 2.0000 mg | Freq: Once | INTRAMUSCULAR | Status: DC | PRN

## 2017-09-27 MED ORDER — GLUCOSE 40 % PO GEL
ORAL | Status: AC
  Administered 2017-09-27: 37.5 g
  Filled 2017-09-27: qty 1

## 2017-09-27 NOTE — Procedures (Signed)
HD cath has been removed. On HD via RUE AV access. BFR 400cc/min. Goal 3000, BP ok K 4.0 Hgb 12.6 Will need to f/u with technical staff at [facility] to make sure equipment changed prior to discharge. Erling Cruz, MD

## 2017-09-27 NOTE — Progress Notes (Signed)
PROGRESS NOTE    Lance [name]  DUK:[id number] DOB: Aug 22, 1970 DOA: 09/24/2017 PCP: [name], MD   Brief Narrative:   47 year old male with ESRD on HD at home , DM, was transferred from [facility] post cardiac arrest, hyperkalemia to [facility] service. He was intubated and was on pressors briefly and transitioned to [facility] service today .   Assessment & Plan:   Active Problems:   Chest pain   Cardiac arrest [facility])   s/p cardiac arrest suspect from hyperkalemia, in the setting of DKA.  - echocardiogram reviewed and discussed with the patient and family.  Cardiology consulted and recommendations given.  - currently some chest tenderness on palpation from chest compressions.    ESRD on HD.  Further management as per renal.    DKA: Resolved.  CBG (last 3)  Recent Labs    09/27/17 0542 09/27/17 0743 09/27/17 1301  GLUCAP 86 179* 247*   Constipation; Stool softeners ordered.    DVT prophylaxis: scd's Code Status:  Full code.  Family Communication: family at bedside.  Disposition Plan: pending  Clinical improvement.    Consultants:   Nephrology  Cardiology.    Procedures: none.    Antimicrobials: none.    Subjective: Chest pain on tenderness on the sternal area.   Objective: Vitals:   09/27/17 1130 09/27/17 1204 09/27/17 1210 09/27/17 1259  BP: 110/69 124/71 (!) 145/78 (!) 145/75  Pulse: 95 94 96 93  Resp: 16 18 18 18   Temp:   98.4 F (36.9 C) 99.2 F (37.3 C)  TempSrc:   Oral Oral  SpO2: 100% 100% 100% 99%  Weight:   96.6 kg   Height:        Intake/Output Summary (Last 24 hours) at 09/27/2017 1659 Last data filed at 09/27/2017 1500 Gross per 24 hour  Intake 780 ml  Output 2500 ml  Net -1720 ml   Filed Weights   09/26/17 0500 09/27/17 0800 09/27/17 1210  Weight: 100.2 kg 98.4 kg 96.6 kg    Examination:  General exam: Appears calm and comfortable  Respiratory system: Clear to auscultation. Respiratory effort normal. Cardiovascular  system: S1 & S2 heard, RRR. No JVD,. Gastrointestinal system: Abdomen is nondistended, soft and nontender. No organomegaly or masses felt. Normal bowel sounds heard. Central nervous system: Alert and oriented. No focal neurological deficits. Extremities: right BKA. Skin: No rashes, lesions or ulcers Psychiatry: . Mood & affect appropriate.     Data Reviewed: I have personally reviewed following labs and imaging studies  CBC: Recent Labs  Lab 09/24/17 1709 09/24/17 1822 09/25/17 0450 09/26/17 0350 09/27/17 0839  WBC 9.7  --  13.5* 14.3* 10.1  NEUTROABS 8.0*  --   --   --   --   HGB 14.5 16.0 13.5 12.6* 11.3*  HCT 45.2 47.0 40.2 39.7 35.5*  MCV 95.2  --  90.3 95.4 95.9  PLT 148*  --  141* 135* 376*   Basic Metabolic Panel: Recent Labs  Lab 09/24/17 1709 09/24/17 1822 09/24/17 2222 09/25/17 0450 09/25/17 1743 09/26/17 0350 09/27/17 0537 09/27/17 0839  NA 121* 120*  --  139 134* 139  --  131*  K 7.3* 7.0*  --  3.2* 4.1 3.8  --  4.0  CL 72* 83*  --  94* 99 102  --  99  CO2 19*  --   --  29 25 28   --  24  GLUCOSE 893* >700*  --  135* 305* 104*  --  196*  BUN 57* 65*  --  44* 27* 20  --  36*  CREATININE 10.32* 10.60*  --  8.35* 6.16* 5.19*  --  8.21*  CALCIUM 9.4  --   --  9.9 8.4* 9.0  --  8.3*  MG  --   --  2.5* 2.3  --  2.3 2.3  --   PHOS  --   --   --  1.2* 2.4* 3.0  --  3.6   GFR: Estimated Creatinine Clearance: 13.4 mL/min (A) (by C-G formula based on SCr of 8.21 mg/dL (H)). Liver Function Tests: Recent Labs  Lab 09/25/17 0450 09/25/17 1743 09/26/17 0350 09/27/17 0839  AST  --   --  39  --   ALT  --   --  43  --   ALKPHOS  --   --  94  --   BILITOT  --   --  0.8  --   PROT  --   --  6.9  --   ALBUMIN 3.2* 3.0* 3.0* 2.6*   No results for input(s): LIPASE, AMYLASE in the last 168 hours. No results for input(s): AMMONIA in the last 168 hours. Coagulation Profile: Recent Labs  Lab 09/24/17 1847  INR 1.04   Cardiac Enzymes: Recent Labs  Lab  09/24/17 2222 09/25/17 0450 09/25/17 1420 09/26/17 0033  TROPONINI 1.76* 3.63* 3.93* 2.36*   BNP (last 3 results) No results for input(s): PROBNP in the last 8760 hours. HbA1C: No results for input(s): HGBA1C in the last 72 hours. CBG: Recent Labs  Lab 09/27/17 0427 09/27/17 0457 09/27/17 0542 09/27/17 0743 09/27/17 1301  GLUCAP 36* 46* 86 179* 247*   Lipid Profile: Recent Labs    09/25/17 0450  TRIG 102   Thyroid Function Tests: No results for input(s): TSH, T4TOTAL, FREET4, T3FREE, THYROIDAB in the last 72 hours. Anemia Panel: No results for input(s): VITAMINB12, FOLATE, FERRITIN, TIBC, IRON, RETICCTPCT in the last 72 hours. Sepsis Labs: Recent Labs  Lab 09/24/17 2222 09/25/17 0450  LATICACIDVEN 6.3* 2.2*    Recent Results (from the past 240 hour(s))  MRSA PCR Screening     Status: None   Collection Time: 09/24/17  8:49 PM  Result Value Ref Range Status   MRSA by PCR NEGATIVE NEGATIVE Final    Comment:        The GeneXpert MRSA Assay (FDA approved for NASAL specimens only), is one component of a comprehensive MRSA colonization surveillance program. It is not intended to diagnose MRSA infection nor to guide or monitor treatment for MRSA infections. Performed at [facility], [place] [address] [place], [place] [address]          Radiology Studies: No results found.      Scheduled Meds: . aspirin  325 mg Oral Daily  . [START ON 09/29/2017] calcitRIOL  0.25 mcg Oral Once per day on Mon Tue Thu Fri  . carvedilol  3.125 mg Oral BID WC  . Chlorhexidine Gluconate Cloth  6 each Topical Daily  . Chlorhexidine Gluconate Cloth  6 each Topical Q0600  . cloNIDine  0.1 mg Oral Daily  . heparin injection (subcutaneous)  5,000 Units Subcutaneous Q8H  . insulin aspart  0-24 Units Subcutaneous Q4H  . insulin glargine  25 Units Subcutaneous Daily  . lisinopril  40 mg Oral Daily  . pantoprazole  40 mg Oral Daily  . psyllium  1 packet Oral Daily  .  senna-docusate  2 tablet Oral BID  .  sucroferric oxyhydroxide  1,500 mg Oral TID WC   Continuous Infusions:   LOS: 3 days    Time spent: 35 minutes.     Hosie Poisson, MD [facility] Pager [phone]  If 7PM-7AM, please contact night-coverage [email or web address] Password [facility] 09/27/2017, 4:59 PM

## 2017-09-28 LAB — CBC
HEMATOCRIT: 40.4 % (ref 39.0–52.0)
Hemoglobin: 12.6 g/dL — ABNORMAL LOW (ref 13.0–17.0)
MCH: 30 pg (ref 26.0–34.0)
MCHC: 31.2 g/dL (ref 30.0–36.0)
MCV: 96.2 fL (ref 78.0–100.0)
Platelets: 135 10*3/uL — ABNORMAL LOW (ref 150–400)
RBC: 4.2 MIL/uL — AB (ref 4.22–5.81)
RDW: 17.5 % — AB (ref 11.5–15.5)
WBC: 8.3 10*3/uL (ref 4.0–10.5)

## 2017-09-28 LAB — GLUCOSE, CAPILLARY
GLUCOSE-CAPILLARY: 110 mg/dL — AB (ref 70–99)
GLUCOSE-CAPILLARY: 131 mg/dL — AB (ref 70–99)
GLUCOSE-CAPILLARY: 166 mg/dL — AB (ref 70–99)
GLUCOSE-CAPILLARY: 179 mg/dL — AB (ref 70–99)
GLUCOSE-CAPILLARY: 183 mg/dL — AB (ref 70–99)
Glucose-Capillary: 114 mg/dL — ABNORMAL HIGH (ref 70–99)
Glucose-Capillary: 71 mg/dL (ref 70–99)

## 2017-09-28 LAB — MAGNESIUM: Magnesium: 2.4 mg/dL (ref 1.7–2.4)

## 2017-09-28 LAB — POTASSIUM: Potassium: 3.9 mmol/L (ref 3.5–5.1)

## 2017-09-28 MED ORDER — CHLORHEXIDINE GLUCONATE CLOTH 2 % EX PADS: 6.0000 | MEDICATED_PAD | Freq: Every day | CUTANEOUS | Status: DC

## 2017-09-28 MED ORDER — PSYLLIUM 95 % PO PACK: 1.0000 | PACK | Freq: Every day | ORAL | 0 refills | Status: DC

## 2017-09-28 NOTE — Discharge Summary (Addendum)
Physician Discharge Summary  Lance [name] III GYK:[id number] DOB: 1970-07-17 DOA: 09/24/2017  PCP: [name], MD  Admit date: 09/24/2017 Discharge date: 09/30/2017  Admitted From: Home  Disposition:  Home   Recommendations for Outpatient Follow-up:  1. Follow up with PCP in 2 weeks 2. Please obtain BMP/CBC in one week 3. Please follow up with nephrology as recommended.  4. Go to outpatient hemodialysis tomorrow and Friday as arranged for you.  5. Resume your regular schedule of HD at home on Monday.  Discharge Condition:guarded.  CODE STATUS:FULL CODE.  Diet recommendation: Heart Healthy   Brief/Interim Summary: HISTORY OF PRESENT ILLNESS:   HPI obtained from medical chart review and per patient's wife at bedside as patient Lance intubated on mechanical sedation.  47 year old male with PMH significant for Lance not limited to ESRD on home four days a week ( M/ T/ TH/ F) HD, DM, HTN, systolic HF, AS, GERD, and PVD s/p right BKA on 6/14 who originally presented to [facility] ER 8/14 with complaints of nausea and chest pain.  Additionally, he Lance being followed at [facility] for possible kidney/ pancreas transplant.  Wife reports patient completed full treatment of dialysis at home last night and afterwards had unusual hypotension afterwards, as she states he has been actually hypertensive Lance last several weeks.  He did not eat last night as well.  Reports blood sugars have been without change.  This morning, he woke up with severe nausea and vomiting, and was unable to eat and did not take any of his medications.  Around 3pm, he developed chest pain and presented to Lance ER.  Pain apparently radiated to his back. EKG showed ST at 128, peaked twaves, some ST depression and QTc 0.504.  He was chemically treated for his hyperkalemia and placed on insulin gtt. While in ER, patient had a Vfib cardiac arrest, defib, and CPR for 9 mins.  Patient was subsequently intubated.  Lab work shows K of 7.3,  glucose 893, Na 121, Cl 72, BUN 57, sCr 10.32, troponin 0.16, Plt 148, Hgb 14.5, WBC 9.7.  EKG non-acute.  Patient to be transferred to [facility] for further medical management including renal and cardiology, PCCM to admit.   47 year old male with ESRD on HD at home , DM, was transferred from [facility] post cardiac arrest, hyperkalemia to [facility] service. He was intubated and was on pressors briefly and transferred to [facility] service on 8/17. Pt denies any complaints and wants to go home.   Discharge Diagnoses:  Active Problems:   Chest pain   Cardiac arrest [facility])  s/p cardiac arrest suspect from hyperkalemia, in Lance setting of DKA.  - echocardiogram reviewed and discussed with Lance patient and family. Echocardiogram shows normal LVEF. Improvement when compared to Lance last echo. Continue with coreg.  Cardiology consulted and recommendations given.  - currently some chest tenderness on palpation from chest compressions.   ESRD on HD.  Lance nephrology team made arrangements for patient to have new home dialysis equipment.  They made arrangements for him to have outpatient hemodialysis tomorrow and Friday and planning for him to begin home dialysis with his new equipment on Monday. I confirmed this with Araeli Ellinwood by phone.    DKA: Resolved.  CBG (last 3)  Recent Labs    09/29/17 2123 09/30/17 0335 09/30/17 1125  GLUCAP 430* 148* 204*   Resume home dose of long acting insulin.  Pt reports his last A1c Lance around 6.7. His nausea,  vomiting prior to admission resolved.  He denies any history of gastroparesis.  Resumed patient back on his home insulin doses.    Prolonged Qt interval:  Reviewed EKG today, Lance interval Lance better than on 8.15. He Lance asymptomatic. And his mag level improved.   Constipation; Stool softeners ordered  Discharge Instructions  Discharge Instructions    Call MD for:  difficulty breathing, headache or visual disturbances   Complete by:  As directed    Call MD for:   extreme fatigue   Complete by:  As directed    Call MD for:  persistant dizziness or light-headedness   Complete by:  As directed    Call MD for:  persistant nausea and vomiting   Complete by:  As directed    Call MD for:  severe uncontrolled pain   Complete by:  As directed    Diet - low sodium heart healthy   Complete by:  As directed    Discharge instructions   Complete by:  As directed    Please follow up with PCP in one week.   Increase activity slowly   Complete by:  As directed      Allergies as of 09/30/2017      Reactions   Atorvastatin Other (See Comments)   Muscle pain   Minoxidil Other (See Comments)   Fluid retention   Adhesive [tape] Other (See Comments)   MUST USE "SILK TAPE," AS ANY OTHER "BURNS" Lance SKIN   Statins Other (See Comments)   Muscle pain      Medication List    STOP taking these medications   calcium acetate 667 MG capsule Commonly known as:  PHOSLO   Darbepoetin Alfa 60 MCG/0.3ML Sosy injection Commonly known as:  ARANESP   gabapentin 100 MG capsule Commonly known as:  NEURONTIN   gabapentin 300 MG capsule Commonly known as:  NEURONTIN   methocarbamol 500 MG tablet Commonly known as:  ROBAXIN   oxyCODONE-acetaminophen 5-325 MG tablet Commonly known as:  PERCOCET/ROXICET   pentoxifylline 400 MG CR tablet Commonly known as:  TRENTAL     TAKE these medications   acetaminophen 500 MG tablet Commonly known as:  TYLENOL Take 1,000 mg by mouth every 6 (six) hours as needed (for pain).   aspirin 325 MG EC tablet Take 1 tablet (325 mg total) by mouth daily.   calcitRIOL 0.25 MCG capsule Commonly known as:  ROCALTROL Take 1 capsule (0.25 mcg total) by mouth every Monday, Wednesday, and Friday with hemodialysis. What changed:    when to take this  additional instructions   carvedilol 3.125 MG tablet Commonly known as:  COREG Take 1 tablet (3.125 mg total) by mouth 2 (two) times daily with a meal. What changed:  how much to  take   cloNIDine 0.1 MG tablet Commonly known as:  CATAPRES Take 0.1 mg by mouth daily.   docusate sodium 100 MG capsule Commonly known as:  COLACE Take 1 capsule (100 mg total) by mouth 2 (two) times daily. What changed:    how much to take  when to take this  reasons to take this   Sandy 1 each by Does not apply route every 14 (fourteen) days.   FREESTYLE LIBRE 14 DAY SENSOR Misc 1 each by Does not apply route every 14 (fourteen) days.   HUMALOG KWIKPEN  Inject 15-20 Units into Lance skin See admin instructions. Sliding scale three times daily   insulin glargine 100 unit/mL  Sopn Commonly known as:  LANTUS Inject 0.25 mLs (25 Units total) into Lance skin at bedtime.   lisinopril 40 MG tablet Commonly known as:  PRINIVIL,ZESTRIL Take 40 mg by mouth daily.   ondansetron 4 MG disintegrating tablet Commonly known as:  ZOFRAN-ODT Take 1 tablet (4 mg total) by mouth every 8 (eight) hours as needed for nausea or vomiting.   pantoprazole 40 MG tablet Commonly known as:  PROTONIX TAKE 1 TABLET BY MOUTH EVERY DAY   polyethylene glycol packet Commonly known as:  MIRALAX / GLYCOLAX Take 17 g by mouth daily. What changed:    when to take this  additional instructions   psyllium 95 % Pack Commonly known as:  HYDROCIL/METAMUCIL Take 1 packet by mouth daily.   sucroferric oxyhydroxide 500 MG chewable tablet Commonly known as:  VELPHORO Chew 3 tablets (1,500 mg total) by mouth 3 (three) times daily.      Follow-up Information    [name], MD. Schedule an appointment as soon as possible for a visit in 1 week(s).   Specialty:  Family Medicine Contact information: [place] [address] [phone]          Allergies  Allergen Reactions  . Atorvastatin Other (See Comments)    Muscle pain   . Minoxidil Other (See Comments)    Fluid retention   . Adhesive [Tape] Other (See Comments)    MUST USE  "SILK TAPE," AS ANY OTHER "BURNS" Lance SKIN  . Statins Other (See Comments)    Muscle pain    Consultations:  Nephrology Dr Florene Glen   Cardiology Dr Meda Coffee.    Procedures/Studies: Dg Chest Port 1 View  Result Date: 09/24/2017 CLINICAL DATA:  Check endotracheal tube placement EXAM: PORTABLE CHEST 1 VIEW COMPARISON:  Film from earlier in Lance same day. FINDINGS: Endotracheal tube and nasogastric catheter are again seen and stable. Right jugular central line Lance noted at Lance cavoatrial junction. No pneumothorax Lance seen. Right subclavian vein stent Lance noted. Lance lungs are well aerated bilaterally. IMPRESSION: No pneumothorax following central line placement. Electronically Signed   By: [name] M.D.   On: 09/24/2017 23:09   Dg Chest Port 1 View  Result Date: 09/24/2017 CLINICAL DATA:  Initial evaluation for acute chest pain, status post CPR. EXAM: PORTABLE CHEST 1 VIEW COMPARISON:  Prior radiograph from 08/07/2017 FINDINGS: Endotracheal tube in place with tip positioned 3.9 cm above Lance carina. Enteric tube courses into Lance abdomen. Defibrillator pads overlie Lance left chest. Moderate cardiomegaly, stable.  Mediastinal silhouette normal. Lungs mildly hypoinflated. Diffuse vascular and interstitial prominence consistent with mild diffuse pulmonary interstitial edema. No focal infiltrates. No pleural effusion. No pneumothorax. No acute osseous abnormality. Vascular stent overlies Lance distal right subclavian region. IMPRESSION: 1. Support apparatus in satisfactory position as above. 2. Cardiomegaly with mild diffuse pulmonary interstitial edema. Electronically Signed   By: Jeannine Boga M.D.   On: 09/24/2017 18:44   Ir Dialy Shunt Intro Needle/intracath Initial W/img Right  Result Date: 09/29/2017 INDICATION: Elevated venous pressure EXAM: ARTERIOVENOUS FISTULOGRAM MEDICATIONS: [name]. ANESTHESIA/SEDATION: Moderate Sedation Time: Lance patient was continuously monitored during Lance procedure by Lance  interventional radiology nurse under my direct supervision. FLUOROSCOPY TIME:  Fluoroscopy Time:  minutes 6 seconds (6 mGy). COMPLICATIONS: [name] immediate. PROCEDURE: Informed written consent was obtained from Lance patient after a thorough discussion of Lance procedural risks, benefits and alternatives. All questions were addressed. Maximal Sterile Barrier Technique was utilized including caps, mask, sterile gowns, sterile gloves,  sterile drape, hand hygiene and skin antiseptic. A timeout was performed prior to Lance initiation of Lance procedure. Lance right arm was prepped and draped in a sterile fashion. An 18 gauge Angiocath was inserted into Lance outflow vein. Contrast was injected. Lance Angiocath was then removed and hemostasis was achieved with direct pressure. FINDINGS: Lance arteriovenous anastomosis in Lance antecubital fossa, outflow cephalic vein, and central venous structures are all widely patent. 2 outflow cephalic vein pseudoaneurysms in Lance arm are noted. IMPRESSION: No significant stenosis in Lance right arm AV fistula circuit. ACCESS: This access remains amenable to future percutaneous interventions as clinically indicated. Electronically Signed   By: Marybelle Killings M.D.   On: 09/29/2017 16:04    Subjective: Pt without complaints today.  Marland Kitchen   Discharge Exam: Vitals:   09/30/17 1059 09/30/17 1124  BP: 114/67 126/70  Pulse: 93 89  Resp: 18 18  Temp: 98.3 F (36.8 C) 98.2 F (36.8 C)  SpO2: 98% 100%   Vitals:   09/30/17 1045 09/30/17 1052 09/30/17 1059 09/30/17 1124  BP: 108/62 (!) 97/52 114/67 126/70  Pulse: 99 100 93 89  Resp:  18 18 18   Temp:  98.4 F (36.9 C) 98.3 F (36.8 C) 98.2 F (36.8 C)  TempSrc:  Oral Oral Oral  SpO2:  100% 98% 100%  Weight:  95.8 kg    Height:       General: chronically ill appearing male, Pt Lance alert, awake, not in acute distress Cardiovascular: normal S1/S2 +, no rubs, no gallops Respiratory: CTA bilaterally, no wheezing, no rhonchi Abdominal: Soft, NT,  ND, bowel sounds + Extremities: no edema, no cyanosis  Lance results of significant diagnostics from this hospitalization (including imaging, microbiology, ancillary and laboratory) are listed below for reference.     Microbiology: Recent Results (from Lance past 240 hour(s))  MRSA PCR Screening     Status: [name]   Collection Time: 09/24/17  8:49 PM  Result Value Ref Range Status   MRSA by PCR NEGATIVE NEGATIVE Final    Comment:        Lance GeneXpert MRSA Assay (FDA approved for NASAL specimens only), Lance one component of a comprehensive MRSA colonization surveillance program. It Lance not intended to diagnose MRSA infection nor to guide or monitor treatment for MRSA infections. Performed at [facility], [place] [address] [place], [place] [address]      Labs: BNP (last 3 results) No results for input(s): BNP in Lance last 8760 hours. Basic Metabolic Panel: Recent Labs  Lab 09/25/17 1743 09/26/17 0350 09/27/17 0537 09/27/17 0839 09/28/17 0451 09/28/17 1123 09/29/17 0452 09/29/17 0926 09/30/17 0510  NA 134* 139  --  131*  --   --   --  137 133*  K 4.1 3.8  --  4.0  --  3.9  --  3.9 3.7  CL 99 102  --  99  --   --   --  100 97*  CO2 25 28  --  24  --   --   --  27 27  GLUCOSE 305* 104*  --  196*  --   --   --  78 170*  BUN 27* 20  --  36*  --   --   --  44* 40*  CREATININE 6.16* 5.19*  --  8.21*  --   --   --  10.16* 9.38*  CALCIUM 8.4* 9.0  --  8.3*  --   --   --  8.7* 8.6*  MG  --  2.3 2.3  --  2.4  --  2.4  --  2.3  PHOS 2.4* 3.0  --  3.6  --   --   --  3.4 3.4   Liver Function Tests: Recent Labs  Lab 09/25/17 1743 09/26/17 0350 09/27/17 0839 09/29/17 0926 09/30/17 0510  AST  --  39  --   --   --   ALT  --  43  --   --   --   ALKPHOS  --  94  --   --   --   BILITOT  --  0.8  --   --   --   PROT  --  6.9  --   --   --   ALBUMIN 3.0* 3.0* 2.6* 2.7* 2.8*   No results for input(s): LIPASE, AMYLASE in Lance last 168 hours. No results for input(s): AMMONIA in Lance  last 168 hours. CBC: Recent Labs  Lab 09/24/17 1709  09/26/17 0350 09/27/17 0839 09/28/17 0451 09/29/17 0452 09/30/17 0510  WBC 9.7   < > 14.3* 10.1 8.3 6.3 6.7  NEUTROABS 8.0*  --   --   --   --   --   --   HGB 14.5   < > 12.6* 11.3* 12.6* 11.4* 12.3*  HCT 45.2   < > 39.7 35.5* 40.4 36.2* 38.9*  MCV 95.2   < > 95.4 95.9 96.2 95.3 94.6  PLT 148*   < > 135* 115* 135* 146* 170   < > = values in this interval not displayed.   Cardiac Enzymes: Recent Labs  Lab 09/24/17 2222 09/25/17 0450 09/25/17 1420 09/26/17 0033  TROPONINI 1.76* 3.63* 3.93* 2.36*   BNP: Invalid input(s): POCBNP CBG: Recent Labs  Lab 09/29/17 1408 09/29/17 1758 09/29/17 2123 09/30/17 0335 09/30/17 1125  GLUCAP 96 202* 430* 148* 204*   D-Dimer No results for input(s): DDIMER in Lance last 72 hours. Hgb A1c No results for input(s): HGBA1C in Lance last 72 hours. Lipid Profile No results for input(s): CHOL, HDL, LDLCALC, TRIG, CHOLHDL, LDLDIRECT in Lance last 72 hours. Thyroid function studies No results for input(s): TSH, T4TOTAL, T3FREE, THYROIDAB in Lance last 72 hours.  Invalid input(s): FREET3 Anemia work up No results for input(s): VITAMINB12, FOLATE, FERRITIN, TIBC, IRON, RETICCTPCT in Lance last 72 hours. Urinalysis    Component Value Date/Time   COLORURINE RED (A) 09/24/2017 2122   APPEARANCEUR CLOUDY (A) 09/24/2017 2122   LABSPEC  09/24/2017 2122    TEST NOT REPORTED DUE TO COLOR INTERFERENCE OF URINE PIGMENT   PHURINE  09/24/2017 2122    TEST NOT REPORTED DUE TO COLOR INTERFERENCE OF URINE PIGMENT   GLUCOSEU (A) 09/24/2017 2122    TEST NOT REPORTED DUE TO COLOR INTERFERENCE OF URINE PIGMENT   HGBUR (A) 09/24/2017 2122    TEST NOT REPORTED DUE TO COLOR INTERFERENCE OF URINE PIGMENT   BILIRUBINUR (A) 09/24/2017 2122    TEST NOT REPORTED DUE TO COLOR INTERFERENCE OF URINE PIGMENT   KETONESUR (A) 09/24/2017 2122    TEST NOT REPORTED DUE TO COLOR INTERFERENCE OF URINE PIGMENT   PROTEINUR (A)  09/24/2017 2122    TEST NOT REPORTED DUE TO COLOR INTERFERENCE OF URINE PIGMENT   UROBILINOGEN 0.2 05/05/2014 0904   NITRITE (A) 09/24/2017 2122    TEST NOT REPORTED DUE TO COLOR INTERFERENCE OF URINE PIGMENT   LEUKOCYTESUR (A) 09/24/2017 2122    TEST NOT REPORTED DUE TO COLOR  INTERFERENCE OF URINE PIGMENT   Sepsis Labs Invalid input(s): PROCALCITONIN,  WBC,  LACTICIDVEN Microbiology Recent Results (from Lance past 240 hour(s))  MRSA PCR Screening     Status: [name]   Collection Time: 09/24/17  8:49 PM  Result Value Ref Range Status   MRSA by PCR NEGATIVE NEGATIVE Final    Comment:        Lance GeneXpert MRSA Assay (FDA approved for NASAL specimens only), Lance one component of a comprehensive MRSA colonization surveillance program. It Lance not intended to diagnose MRSA infection nor to guide or monitor treatment for MRSA infections. Performed at [facility], [place] [address], [place], [place] [address]    Time coordinating discharge: 34  minutes  SIGNED:  Irwin Brakeman, MD  [facility] 09/30/2017, 1:26 PM Pager [phone]  If 7PM-7AM, please contact night-coverage [email or web address] Password [email or web address]

## 2017-09-28 NOTE — Progress Notes (Addendum)
[name] Progress Note   Subjective: Patient with ice on chest for chest soreness S/P CPR-chest compressions. Patient is concerned about getting home HD machine changed-says home therapies says they aren't going to do this. He endorses good control of hyperglycemia prior to cardiac arrest. He says he feels much better, wants to go home.   Objective Vitals:   09/27/17 1951 09/28/17 0433 09/28/17 0637 09/28/17 0809  BP: 130/75  (!) 162/88 138/84  Pulse: 82  81 82  Resp: 16  18 18   Temp: 98.7 F (37.1 C)  98.2 F (36.8 C) 97.9 F (36.6 C)  TempSrc: Oral  Oral Oral  SpO2: 100%  100% 98%  Weight:  96.6 kg    Height:       Physical Exam General: Well appearing male in NAD Heart: J0,K9, 2/6 systolic M. No R/G. No JVD.  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 No LLE edema.  Dialysis Access: RUA AVF + bruit   Additional Objective Labs: Basic Metabolic Panel: Recent Labs  Lab 09/25/17 1743 09/26/17 0350 09/27/17 0839  NA 134* 139 131*  K 4.1 3.8 4.0  CL 99 102 99  CO2 25 28 24   GLUCOSE 305* 104* 196*  BUN 27* 20 36*  CREATININE 6.16* 5.19* 8.21*  CALCIUM 8.4* 9.0 8.3*  PHOS 2.4* 3.0 3.6   Liver Function Tests: Recent Labs  Lab 09/25/17 1743 09/26/17 0350 09/27/17 0839  AST  --  39  --   ALT  --  43  --   ALKPHOS  --  94  --   BILITOT  --  0.8  --   PROT  --  6.9  --   ALBUMIN 3.0* 3.0* 2.6*   No results for input(s): LIPASE, AMYLASE in the last 168 hours. CBC: Recent Labs  Lab 09/24/17 1709  09/25/17 0450 09/26/17 0350 09/27/17 0839 09/28/17 0451  WBC 9.7  --  13.5* 14.3* 10.1 8.3  NEUTROABS 8.0*  --   --   --   --   --   HGB 14.5   < > 13.5 12.6* 11.3* 12.6*  HCT 45.2   < > 40.2 39.7 35.5* 40.4  MCV 95.2  --  90.3 95.4 95.9 96.2  PLT 148*  --  141* 135* 115* 135*   < > = values in this interval not displayed.   Blood Culture    Component Value Date/Time   SDES FLUID LEFT PLEURAL 11/30/2015 1426   SDES FLUID LEFT PLEURAL 11/30/2015 1426   SPECREQUEST BOTTLES DRAWN AEROBIC AND ANAEROBIC 10CC 11/30/2015 1426   SPECREQUEST NONE 11/30/2015 1426   CULT NO GROWTH 5 DAYS 11/30/2015 1426   REPTSTATUS 12/06/2015 FINAL 11/30/2015 1426   REPTSTATUS 11/30/2015 FINAL 11/30/2015 1426    Cardiac Enzymes: Recent Labs  Lab 09/24/17 2222 09/25/17 0450 09/25/17 1420 09/26/17 0033  TROPONINI 1.76* 3.63* 3.93* 2.36*   CBG: Recent Labs  Lab 09/27/17 1727 09/27/17 2035 09/27/17 2357 09/28/17 0309 09/28/17 0742  GLUCAP 162* 184* 179* 110* 71   Iron Studies: No results for input(s): IRON, TIBC, TRANSFERRIN, FERRITIN in the last 72 hours. @lablastinr3 @ Studies/Results: No results found. Medications:  . aspirin  325 mg Oral Daily  . [START ON 09/29/2017] calcitRIOL  0.25 mcg Oral Once per day on Mon Tue Thu Fri  . carvedilol  3.125 mg Oral BID WC  . Chlorhexidine Gluconate Cloth  6 each Topical Daily  . Chlorhexidine Gluconate Cloth  6 each Topical Q0600  .  cloNIDine  0.1 mg Oral Daily  . heparin injection (subcutaneous)  5,000 Units Subcutaneous Q8H  . insulin aspart  0-24 Units Subcutaneous Q4H  . insulin glargine  25 Units Subcutaneous Daily  . lisinopril  40 mg Oral Daily  . pantoprazole  40 mg Oral Daily  . psyllium  1 packet Oral Daily  . senna-docusate  2 tablet Oral BID  . sucroferric oxyhydroxide  1,500 mg Oral TID WC    HD orders: Nx Stage- 4X Week, MonTueThuFri, CAR 170, Therapy Fluid 2.0 K 45 Lactate, Volume per Tx 60 (liters), Flow Fraction 64 %, BFR 450, EDW 96.5 kg. Followed by Dr. Posey Pronto.   Assessment/Plan: 1. S/P cardiac arrest-doing well. Neuro intact. Per primary. Chest soreness from chest compressions-no rub. Cardiac arrest thought to be driven by hyperglycemia/hyperkalemia.  2. CAD-treating medically.  3. Uncontrolled DMT1-per primary. Reasonable control of BS at present. Per primary.  4. ESRD -Home HD-was on CRRT, has been transitioned to IHD. Use T,Th,S  schedule while in hospital. K+ 4.0. Next HD 09/30/17 if still in hospital.  5. Anemia - HGB 12.6. No ESA needed.  6. Secondary hyperparathyroidism - Phos 3.0 Ca 8.3 C Ca 9.4 7. HTN/volume - BP variable but mostly controlled. Continue Lisinopril, clonidine and Coreg.  8. Nutrition - Albumin 2.6 renal/carb mod diet, prostat, renal vits.   Rita H. Brown NP-C 09/28/2017, 9:01 AM  [facility] [phone]  Renal Attending: We need to get his equipment changed before discharge. Lance Cruz, MD

## 2017-09-29 ENCOUNTER — Inpatient Hospital Stay (HOSPITAL_COMMUNITY): Payer: Medicare Other

## 2017-09-29 ENCOUNTER — Encounter (HOSPITAL_COMMUNITY): Payer: Self-pay | Admitting: Interventional Radiology

## 2017-09-29 DIAGNOSIS — N186 End stage renal disease: Secondary | ICD-10-CM | POA: Diagnosis not present

## 2017-09-29 DIAGNOSIS — D631 Anemia in chronic kidney disease: Secondary | ICD-10-CM | POA: Diagnosis not present

## 2017-09-29 DIAGNOSIS — E1029 Type 1 diabetes mellitus with other diabetic kidney complication: Secondary | ICD-10-CM | POA: Diagnosis not present

## 2017-09-29 DIAGNOSIS — K7689 Other specified diseases of liver: Secondary | ICD-10-CM | POA: Diagnosis not present

## 2017-09-29 DIAGNOSIS — N2581 Secondary hyperparathyroidism of renal origin: Secondary | ICD-10-CM | POA: Diagnosis not present

## 2017-09-29 DIAGNOSIS — E875 Hyperkalemia: Secondary | ICD-10-CM

## 2017-09-29 DIAGNOSIS — D509 Iron deficiency anemia, unspecified: Secondary | ICD-10-CM | POA: Diagnosis not present

## 2017-09-29 DIAGNOSIS — R079 Chest pain, unspecified: Secondary | ICD-10-CM

## 2017-09-29 HISTORY — PX: IR DIALY SHUNT INTRO NEEDLE/INTRACATH INITIAL W/IMG RIGHT: IMG6115

## 2017-09-29 LAB — RENAL FUNCTION PANEL
Albumin: 2.7 g/dL — ABNORMAL LOW (ref 3.5–5.0)
Anion gap: 10 (ref 5–15)
BUN: 44 mg/dL — AB (ref 6–20)
CALCIUM: 8.7 mg/dL — AB (ref 8.9–10.3)
CHLORIDE: 100 mmol/L (ref 98–111)
CO2: 27 mmol/L (ref 22–32)
Creatinine, Ser: 10.16 mg/dL — ABNORMAL HIGH (ref 0.61–1.24)
GFR calc non Af Amer: 5 mL/min — ABNORMAL LOW (ref >60)
GFR, EST AFRICAN AMERICAN: 6 mL/min — AB (ref >60)
GLUCOSE: 78 mg/dL (ref 70–99)
Phosphorus: 3.4 mg/dL (ref 2.5–4.6)
Potassium: 3.9 mmol/L (ref 3.5–5.1)
Sodium: 137 mmol/L (ref 135–145)

## 2017-09-29 LAB — GLUCOSE, CAPILLARY
GLUCOSE-CAPILLARY: 215 mg/dL — AB (ref 70–99)
GLUCOSE-CAPILLARY: 93 mg/dL (ref 70–99)
GLUCOSE-CAPILLARY: 202 mg/dL — AB (ref 70–99)
GLUCOSE-CAPILLARY: 430 mg/dL — AB (ref 70–99)
Glucose-Capillary: 96 mg/dL (ref 70–99)

## 2017-09-29 LAB — CBC
HEMATOCRIT: 36.2 % — AB (ref 39.0–52.0)
Hemoglobin: 11.4 g/dL — ABNORMAL LOW (ref 13.0–17.0)
MCH: 30 pg (ref 26.0–34.0)
MCHC: 31.5 g/dL (ref 30.0–36.0)
MCV: 95.3 fL (ref 78.0–100.0)
PLATELETS: 146 10*3/uL — AB (ref 150–400)
RBC: 3.8 MIL/uL — ABNORMAL LOW (ref 4.22–5.81)
RDW: 17.2 % — ABNORMAL HIGH (ref 11.5–15.5)
WBC: 6.3 10*3/uL (ref 4.0–10.5)

## 2017-09-29 LAB — MAGNESIUM: MAGNESIUM: 2.4 mg/dL (ref 1.7–2.4)

## 2017-09-29 MED ORDER — OXYCODONE-ACETAMINOPHEN 5-325 MG PO TABS
ORAL_TABLET | ORAL | Status: AC
  Filled 2017-09-29: qty 1

## 2017-09-29 MED ORDER — INSULIN ASPART 100 UNIT/ML ~~LOC~~ SOLN
0.0000 [IU] | Freq: Three times a day (TID) | SUBCUTANEOUS | Status: DC
  Administered 2017-09-30: 8 [IU] via SUBCUTANEOUS

## 2017-09-29 MED ORDER — IOPAMIDOL (ISOVUE-300) INJECTION 61%
INTRAVENOUS | Status: AC
  Administered 2017-09-29: 32 mL
  Filled 2017-09-29: qty 100

## 2017-09-29 MED ORDER — INSULIN GLARGINE 100 UNIT/ML ~~LOC~~ SOLN
23.0000 [IU] | Freq: Every day | SUBCUTANEOUS | Status: DC
  Administered 2017-09-29: 23 [IU] via SUBCUTANEOUS
  Filled 2017-09-29 (×2): qty 0.23

## 2017-09-29 MED ORDER — POLYETHYLENE GLYCOL 3350 17 G PO PACK
17.0000 g | PACK | Freq: Two times a day (BID) | ORAL | Status: DC
  Administered 2017-09-29 – 2017-09-30 (×2): 17 g via ORAL
  Filled 2017-09-29 (×2): qty 1

## 2017-09-29 MED ORDER — CHLORHEXIDINE GLUCONATE CLOTH 2 % EX PADS: 6.0000 | MEDICATED_PAD | Freq: Every day | CUTANEOUS | Status: DC

## 2017-09-29 MED ORDER — INSULIN ASPART 100 UNIT/ML ~~LOC~~ SOLN
12.0000 [IU] | Freq: Once | SUBCUTANEOUS | Status: AC
  Administered 2017-09-29: 12 [IU] via SUBCUTANEOUS

## 2017-09-29 MED ORDER — BISACODYL 10 MG RE SUPP
10.0000 mg | Freq: Every day | RECTAL | Status: DC | PRN
  Administered 2017-09-29: 10 mg via RECTAL
  Filled 2017-09-29: qty 1

## 2017-09-29 MED ORDER — CALCITRIOL 0.25 MCG PO CAPS
ORAL_CAPSULE | ORAL | Status: AC
  Administered 2017-09-29: 0.25 ug via ORAL
  Filled 2017-09-29: qty 1

## 2017-09-29 MED ORDER — SODIUM CHLORIDE 0.9 % IV SOLN: 100.0000 mL | INTRAVENOUS | Status: DC | PRN

## 2017-09-29 MED ORDER — DOCUSATE SODIUM 283 MG RE ENEM
1.0000 | ENEMA | Freq: Every day | RECTAL | Status: DC | PRN
  Filled 2017-09-29: qty 1

## 2017-09-29 NOTE — Progress Notes (Signed)
CBG=430,patient has scheduled lantus tonight. Text paged MD on call for additional novolog coverage,order received. Kenly Henckel, Wonda Cheng, Therapist, sports

## 2017-09-29 NOTE — Consult Note (Signed)
Chief Complaint: Patient was seen in consultation today for right upper arm dialysis graft evaluation and possible intervention Chief Complaint  Patient presents with  . Chest Pain   at the request of Dr Jonnie Finner   Supervising Physician: Marybelle Killings  Patient Status: [facility] - In-pt  History of Present Illness: [name] is a 47 y.o. male   ESRD- Home dialysis CRRT Post recent cardiac arrest-- chest compressions 8/14 [facility] (DKA/ severe hyperkalemia; CHF) TX to [facility]  Rt upper arm dialysis graft In dialysis now Slow flow High venous pressures  Scheduled for right arm dialysis graft evaluation and possible intervention   Past Medical History:  Diagnosis Date  . Aortic stenosis    Very mild in 05/2007  . Arthritis    "right shoulder; right knee; feel like I've got it all over" (11/28/2015)  . CHF (congestive heart failure) ([place])   . Degenerative joint disease    Left TKA  . Diastolic heart failure (HCC)    LVEF 65-70% with grade 2 diastolic dysfunction  . ESRD (end stage renal disease) on dialysis [facility])    (as of 09/24/2017) pt does HD at home on a M/Tu/Th/F schedule.  . Essential hypertension, benign 2000   LVH  . GERD (gastroesophageal reflux disease)   . HCAP (healthcare-associated pneumonia) 11/28/2015  . Hyperlipidemia 2000  . Loculated pleural effusion 11/28/2015   [name] 11/28/2015  . Pneumonia 12/2013; 05/2014  . PSVT (paroxysmal supraventricular tachycardia) (HCC)    Nonsustained; asymptomatic; diagnosed by event recorder in 2006  . Tobacco abuse, in remission    20 pack years; discontinued in 1985; bronchitic changes on chest x-ray and 2009  . Type 1 diabetes mellitus ([name]) 1990    Past Surgical History:  Procedure Laterality Date  . AMPUTATION Right 06/06/2017   Procedure: RIGHT BELOW KNEE AMPUTATION;  Surgeon: [name], MD;  Location: [place];  Service: Orthopedics;  Laterality: Right;  . APPLICATION OF WOUND VAC Right 07/25/2017   Procedure: APPLICATION OF WOUND VAC;  Surgeon: [name], MD;  Location: [place];  Service: Orthopedics;  Laterality: Right;  . AV FISTULA PLACEMENT Right 06/23/2014   Procedure: ARTERIOVENOUS (AV) FISTULA CREATION;  Surgeon: Angelia Mould, MD;  Location: [place];  Service: Vascular;  Laterality: Right;  . CARDIAC CATHETERIZATION     [facility]- evaluation for Kidney/ pancreas transplant  . EYE SURGERY Bilateral    "for glaucoma"  . INGUINAL HERNIA REPAIR Right    As a child  . LOWER EXTREMITY ANGIOGRAPHY N/A 05/27/2017   Procedure: LOWER EXTREMITY ANGIOGRAPHY;  Surgeon: [name], MD;  Location: [place] [facility];  Service: Cardiovascular;  Laterality: N/A;  . STUMP REVISION Right 07/25/2017   Procedure: REVISION RIGHT BELOW KNEE AMPUTATION;;  Surgeon: [name], MD;  Location: [place];  Service: Orthopedics;  Laterality: Right;  . WISDOM TOOTH EXTRACTION  1987    Allergies: Atorvastatin; Minoxidil; Adhesive [tape]; and Statins  Medications: Prior to Admission medications   Medication Sig Start Date End Date Taking? Authorizing Provider  acetaminophen (TYLENOL) 500 MG tablet Take 1,000 mg by mouth every 6 (six) hours as needed (for pain).   Yes [provider]  aspirin EC 325 MG EC tablet Take 1 tablet (325 mg total) by mouth daily. 06/18/17  Yes Angiulli, Lavon Paganini, PA-C  calcitRIOL (ROCALTROL) 0.25 MCG capsule Take 1 capsule (0.25 mcg total) by mouth every Monday, Wednesday, and Friday with hemodialysis. Patient taking differently: Take 0.25 mcg by  mouth See admin instructions. Take 0.25 mcg by mouth once a day on Mon/Tues/Thurs/Fri with home dialysis 06/18/17  Yes Angiulli, Lavon Paganini, PA-C  carvedilol (COREG) 3.125 MG tablet Take 1 tablet (3.125 mg total) by mouth 2 (two) times daily with a meal. Patient taking differently: Take 6.25 mg by mouth 2 (two) times daily with a meal.  06/18/17  Yes Angiulli, Lavon Paganini, PA-C  cloNIDine (CATAPRES) 0.1 MG tablet Take 0.1 mg by mouth  daily.  09/11/17  Yes [provider]  docusate sodium (COLACE) 100 MG capsule Take 1 capsule (100 mg total) by mouth 2 (two) times daily. Patient taking differently: Take 200 mg by mouth daily as needed (when taking pain meds).  06/18/17  Yes Angiulli, Lavon Paganini, PA-C  insulin glargine (LANTUS) 100 unit/mL SOPN Inject 0.25 mLs (25 Units total) into the skin at bedtime. 07/01/17  Yes Nida, Marella Chimes, MD  Insulin Lispro (HUMALOG KWIKPEN [place]) Inject 15-20 Units into the skin See admin instructions. Sliding scale three times daily   Yes [provider]  lisinopril (PRINIVIL,ZESTRIL) 40 MG tablet Take 40 mg by mouth daily.  08/25/17  Yes [provider]  ondansetron (ZOFRAN ODT) 4 MG disintegrating tablet Take 1 tablet (4 mg total) by mouth every 8 (eight) hours as needed for nausea or vomiting. 07/01/17  Yes [name], MD  pantoprazole (PROTONIX) 40 MG tablet TAKE 1 TABLET BY MOUTH EVERY DAY Patient taking differently: Take 40 mg by mouth daily.  09/08/17  Yes [name], MD  polyethylene glycol [facility]) packet Take 17 g by mouth daily. Patient taking differently: Take 17 g by mouth See admin instructions. Take 17 grams by mouth, mixed, once a day only when taking pain meds 07/03/17  Yes Jamse Arn, MD  sucroferric oxyhydroxide (VELPHORO) 500 MG chewable tablet Chew 3 tablets (1,500 mg total) by mouth 3 (three) times daily. 06/18/17  Yes Angiulli, Lavon Paganini, PA-C  calcium acetate (PHOSLO) 667 MG capsule Take 1 capsule (667 mg total) by mouth 3 (three) times daily. Patient not taking: Reported on 09/25/2017 06/18/17   Angiulli, Lavon Paganini, PA-C  Continuous Blood Gluc Receiver (FREESTYLE LIBRE 14 DAY READER) DEVI 1 each by Does not apply route every 14 (fourteen) days. 07/29/17   Cassandria Anger, MD  Continuous Blood Gluc Sensor (FREESTYLE LIBRE 14 DAY SENSOR) MISC 1 each by Does not apply route every 14 (fourteen) days. 07/21/17   Cassandria Anger, MD    Darbepoetin Alfa (ARANESP) 60 MCG/0.3ML SOSY injection Inject 0.3 mLs (60 mcg total) into the vein every Monday with hemodialysis. Patient not taking: Reported on 09/25/2017 06/23/17   Angiulli, Lavon Paganini, PA-C  gabapentin (NEURONTIN) 100 MG capsule Take 1 capsule (100 mg total) by mouth daily. Patient not taking: Reported on 09/25/2017 06/18/17   Angiulli, Lavon Paganini, PA-C  gabapentin (NEURONTIN) 300 MG capsule Take 1 capsule (300 mg total) by mouth every Monday, Wednesday, and Friday with hemodialysis. Patient not taking: Reported on 09/25/2017 06/18/17   Angiulli, Lavon Paganini, PA-C  methocarbamol (ROBAXIN) 500 MG tablet Take 1 tablet (500 mg total) by mouth every 6 (six) hours as needed for muscle spasms. Patient not taking: Reported on 09/25/2017 08/08/17   Bonnielee Haff, MD  oxyCODONE-acetaminophen (PERCOCET/ROXICET) 5-325 MG tablet Take 1 tablet by mouth every 6 (six) hours as needed for severe pain. Patient not taking: Reported on 09/25/2017 08/08/17   Bonnielee Haff, MD  pentoxifylline (TRENTAL) 400 MG CR tablet Take 1 tablet (400 mg  total) by mouth 3 (three) times daily with meals. Patient not taking: Reported on 09/25/2017 06/26/17   [name], MD  psyllium (HYDROCIL/METAMUCIL) 95 % PACK Take 1 packet by mouth daily. 09/29/17   Hosie Poisson, MD     Family History  Problem Relation Age of Onset  . Diabetes Father   . Hypertension Father   . Diabetes Mother   . Hypertension Mother     Social History   Socioeconomic History  . Marital status: Married    Spouse name: Not on file  . Number of children: Not on file  . Years of education: Not on file  . Highest education level: Not on file  Occupational History  . Not on file  Social Needs  . Financial resource strain: Not on file  . Food insecurity:    Worry: Not on file    Inability: Not on file  . Transportation needs:    Medical: Not on file    Non-medical: Not on file  Tobacco Use  . Smoking status: Never Smoker  . Smokeless  tobacco: Never Used  Substance and Sexual Activity  . Alcohol use: Yes    Comment: 11/28/2015 "used to drink; stopped ~ 2 yr ago"  . Drug use: No  . Sexual activity: Yes  Lifestyle  . Physical activity:    Days per week: Not on file    Minutes per session: Not on file  . Stress: Not on file  Relationships  . Social connections:    Talks on phone: Not on file    Gets together: Not on file    Attends religious service: Not on file    Active member of club or organization: Not on file    Attends meetings of clubs or organizations: Not on file    Relationship status: Not on file  Other Topics Concern  . Not on file  Social History Narrative  . Not on file    Review of Systems: A 12 point ROS discussed and pertinent positives are indicated in the HPI above.  All other systems are negative.  Review of Systems  Constitutional: Positive for activity change, appetite change and fatigue.  Respiratory: Negative for shortness of breath.   Cardiovascular: Positive for chest pain.  Gastrointestinal: Negative for abdominal pain.  Neurological: Positive for weakness.  Psychiatric/Behavioral: Negative for behavioral problems and confusion.    Vital Signs: BP 114/73   Pulse 99   Temp 98.3 F (36.8 C) (Oral)   Resp 19   Ht 6' 0.44" (1.84 m)   Wt 213 lb 13.5 oz (97 kg)   SpO2 98%   BMI 28.65 kg/m   Physical Exam  Constitutional: He is oriented to person, place, and time.  Cardiovascular: Normal rate and regular rhythm.  Pulmonary/Chest: Effort normal and breath sounds normal.  Musculoskeletal: Normal range of motion.  Rt BKA  Neurological: He is alert and oriented to person, place, and time.  Skin: Skin is warm.  Psychiatric: He has a normal mood and affect. His behavior is normal.  Nursing note and vitals reviewed.   Imaging: Dg Chest Port 1 View  Result Date: 09/24/2017 CLINICAL DATA:  Check endotracheal tube placement EXAM: PORTABLE CHEST 1 VIEW COMPARISON:  Film from  earlier in the same day. FINDINGS: Endotracheal tube and nasogastric catheter are again seen and stable. Right jugular central line is noted at the cavoatrial junction. No pneumothorax is seen. Right subclavian vein stent is noted. The lungs are well aerated bilaterally.  IMPRESSION: No pneumothorax following central line placement. Electronically Signed   By: [name] M.D.   On: 09/24/2017 23:09   Dg Chest Port 1 View  Result Date: 09/24/2017 CLINICAL DATA:  Initial evaluation for acute chest pain, status post CPR. EXAM: PORTABLE CHEST 1 VIEW COMPARISON:  Prior radiograph from 08/07/2017 FINDINGS: Endotracheal tube in place with tip positioned 3.9 cm above the carina. Enteric tube courses into the abdomen. Defibrillator pads overlie the left chest. Moderate cardiomegaly, stable.  Mediastinal silhouette normal. Lungs mildly hypoinflated. Diffuse vascular and interstitial prominence consistent with mild diffuse pulmonary interstitial edema. No focal infiltrates. No pleural effusion. No pneumothorax. No acute osseous abnormality. Vascular stent overlies the distal right subclavian region. IMPRESSION: 1. Support apparatus in satisfactory position as above. 2. Cardiomegaly with mild diffuse pulmonary interstitial edema. Electronically Signed   By: Jeannine Boga M.D.   On: 09/24/2017 18:44    Labs:  CBC: Recent Labs    09/26/17 0350 09/27/17 0839 09/28/17 0451 09/29/17 0452  WBC 14.3* 10.1 8.3 6.3  HGB 12.6* 11.3* 12.6* 11.4*  HCT 39.7 35.5* 40.4 36.2*  PLT 135* 115* 135* 146*    COAGS: Recent Labs    09/24/17 1847  INR 1.04  APTT 28    BMP: Recent Labs    09/25/17 1743 09/26/17 0350 09/27/17 0839 09/28/17 1123 09/29/17 0926  NA 134* 139 131*  --  137  K 4.1 3.8 4.0 3.9 3.9  CL 99 102 99  --  100  CO2 25 28 24   --  27  GLUCOSE 305* 104* 196*  --  78  BUN 27* 20 36*  --  44*  CALCIUM 8.4* 9.0 8.3*  --  8.7*  CREATININE 6.16* 5.19* 8.21*  --  10.16*  GFRNONAA 10* 12*  7*  --  5*  GFRAA 11* 14* 8*  --  6*    LIVER FUNCTION TESTS: Recent Labs    02/18/17 1438  08/07/17 0304 08/08/17 0631  09/25/17 1743 09/26/17 0350 09/27/17 0839 09/29/17 0926  BILITOT 0.5  --  0.9 0.6  --   --  0.8  --   --   AST 15  --  13* 12*  --   --  39  --   --   ALT 24  --  8 8  --   --  43  --   --   ALKPHOS 161*  --  100 92  --   --  94  --   --   PROT 8.3  --  7.4 6.5  --   --  6.9  --   --   ALBUMIN 4.4   < > 3.1* 2.6*   < > 3.0* 3.0* 2.6* 2.7*   < > = values in this interval not displayed.    TUMOR MARKERS: No results for input(s): AFPTM, CEA, CA199, CHROMGRNA in the last 8760 hours.  Assessment and Plan:  Rt arm dialysis graft slow flow per Renal MD For evaluation of same with possible Intervention IR Pt is aware of dialysis graft evaluation and possible intervention procedure benefits and risks including but not limited to infection; bleeding; damage to graft Agreeable to proceed consent signed and in chart  Thank you for this interesting consult.  I greatly enjoyed meeting DHIREN AZIMI [name] and look forward to participating in their care.  A copy of this report was sent to the requesting provider on this date.  Electronically Signed: Lavonia Drafts, PA-C 09/29/2017, 11:03  AM   I spent a total of 20 Minutes    in face to face in clinical consultation, greater than 50% of which was counseling/coordinating care for right arm dialysis graft evaluation

## 2017-09-29 NOTE — Progress Notes (Signed)
[facility] Progress Note   Subjective: Patient on HD this am.    Objective Vitals:   09/29/17 0432 09/29/17 0821 09/29/17 0837 09/29/17 0900  BP: (!) 150/82 (!) 166/86 (!) 161/83 (!) 156/85  Pulse: 77 80 80 96  Resp: 18 19    Temp: 98.2 F (36.8 C) 98.3 F (36.8 C)    TempSrc:  Oral    SpO2: 99% 98%    Weight:  97 kg    Height:       Physical Exam General: Well appearing male in NAD Heart: I6,E7, 2/6 systolic M. No R/G. No JVD.  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 No LLE edema.  Dialysis Access: RUA AVF + bruit   Additional Objective Labs: Basic Metabolic Panel: Recent Labs  Lab 09/25/17 1743 09/26/17 0350 09/27/17 0839 09/28/17 1123  NA 134* 139 131*  --   K 4.1 3.8 4.0 3.9  CL 99 102 99  --   CO2 25 28 24   --   GLUCOSE 305* 104* 196*  --   BUN 27* 20 36*  --   CREATININE 6.16* 5.19* 8.21*  --   CALCIUM 8.4* 9.0 8.3*  --   PHOS 2.4* 3.0 3.6  --    Liver Function Tests: Recent Labs  Lab 09/25/17 1743 09/26/17 0350 09/27/17 0839  AST  --  39  --   ALT  --  43  --   ALKPHOS  --  94  --   BILITOT  --  0.8  --   PROT  --  6.9  --   ALBUMIN 3.0* 3.0* 2.6*   No results for input(s): LIPASE, AMYLASE in the last 168 hours. CBC: Recent Labs  Lab 09/24/17 1709  09/25/17 0450 09/26/17 0350 09/27/17 0839 09/28/17 0451 09/29/17 0452  WBC 9.7  --  13.5* 14.3* 10.1 8.3 6.3  NEUTROABS 8.0*  --   --   --   --   --   --   HGB 14.5   < > 13.5 12.6* 11.3* 12.6* 11.4*  HCT 45.2   < > 40.2 39.7 35.5* 40.4 36.2*  MCV 95.2  --  90.3 95.4 95.9 96.2 95.3  PLT 148*  --  141* 135* 115* 135* 146*   < > = values in this interval not displayed.   Blood Culture    Component Value Date/Time   SDES FLUID LEFT PLEURAL 11/30/2015 1426   SDES FLUID LEFT PLEURAL 11/30/2015 1426   SPECREQUEST BOTTLES DRAWN AEROBIC AND ANAEROBIC 10CC 11/30/2015 1426   SPECREQUEST NONE 11/30/2015 1426   CULT NO GROWTH 5 DAYS 11/30/2015  1426   REPTSTATUS 12/06/2015 FINAL 11/30/2015 1426   REPTSTATUS 11/30/2015 FINAL 11/30/2015 1426    Cardiac Enzymes: Recent Labs  Lab 09/24/17 2222 09/25/17 0450 09/25/17 1420 09/26/17 0033  TROPONINI 1.76* 3.63* 3.93* 2.36*   CBG: Recent Labs  Lab 09/28/17 1658 09/28/17 2024 09/28/17 2353 09/29/17 0431 09/29/17 0759  GLUCAP 166* 183* 114* 215* 93   Iron Studies: No results for input(s): IRON, TIBC, TRANSFERRIN, FERRITIN in the last 72 hours. @lablastinr3 @ Studies/Results: No results found. Medications: . sodium chloride     . aspirin  325 mg Oral Daily  . calcitRIOL  0.25 mcg Oral Once per day on Mon Tue Thu Fri  . carvedilol  3.125 mg Oral BID WC  . Chlorhexidine Gluconate Cloth  6 each Topical Daily  . Chlorhexidine Gluconate Cloth  6 each Topical Q0600  .  cloNIDine  0.1 mg Oral Daily  . heparin injection (subcutaneous)  5,000 Units Subcutaneous Q8H  . insulin aspart  0-24 Units Subcutaneous TID WC  . insulin glargine  25 Units Subcutaneous Daily  . lisinopril  40 mg Oral Daily  . pantoprazole  40 mg Oral Daily  . psyllium  1 packet Oral Daily  . senna-docusate  2 tablet Oral BID  . sucroferric oxyhydroxide  1,500 mg Oral TID WC    HD orders: Nx Stage- 4X Week, MonTueThuFri, CAR 170, Therapy Fluid 2.0 K 45 Lactate, Volume per Tx 60 (liters), Flow Fraction 64 %, BFR 450, EDW 96.5 kg. Followed by Dr. Posey Pronto.   Assessment/Plan: 1. S/P cardiac arrest-doing well. Neuro intact. Per primary. Chest soreness from chest compressions-no rub. Cardiac arrest thought to be driven by hyperkalemia. Will get fistulogram due to high venous pressures and recent hyperkalemia.   2. CAD-treating medically.  3. Uncontrolled DMT1-per primary. Reasonable control of BS at present. Per primary.  4. ESRD -Home HD-was on CRRT, has been transitioned to IHD. MTuThFri schedule at home.  5. Anemia - HGB 12.6. No ESA needed.  6. Secondary hyperparathyroidism - Phos 3.0 Ca 8.3 C Ca 9.4 7.  HTN/volume - BP variable but mostly controlled. Continue Lisinopril, clonidine and Coreg.  8. Nutrition - Albumin 2.6 renal/carb mod diet, prostat, renal vits.   [name] MD [employer] pgr [phone]   09/29/2017, 10:33 AM

## 2017-09-29 NOTE — Procedures (Signed)
RUE AV fistulagram Widely patent No intervention EBL 0 Comp 0

## 2017-09-29 NOTE — Progress Notes (Signed)
PROGRESS NOTE    Lance [name]  KPT:[id number] DOB: [birth year] DOA: 09/24/2017 PCP: [name], MD   Brief Narrative:   47 year old male with ESRD on HD at home , DM, was transferred from [facility] post cardiac arrest, hyperkalemia to [facility] service. He was intubated and was on pressors briefly and transitioned to [facility] service today .   Assessment & Plan:   Active Problems:   Chest pain   Cardiac arrest [facility])   s/p cardiac arrest suspect from hyperkalemia, in the setting of DKA.  - echocardiogram reviewed and discussed with the patient and family.  Cardiology consulted and recommendations given.  - currently some chest tenderness on palpation from chest compressions.   ESRD on HD.  Further management as per renal.  Per nephrology team they are obtaining a fistulogram to evaluate Rt arm dialysis graft slow flow per Renal MD  DKA: Resolved.  CBG (last 3)  Recent Labs    09/28/17 2353 09/29/17 0431 09/29/17 0759  GLUCAP 114* 215* 93   Constipation; Stool softeners ordered.    DVT prophylaxis: scds Code Status:  Full code.  Family Communication: patient in HD.  Disposition Plan: Home when HD equipment for home use arranged.    Consultants:   Nephrology  Cardiology.    Procedures: none.    Antimicrobials: none.    Subjective: Pt seen on HD today, He has no current complaints.   Objective: Vitals:   09/29/17 0930 09/29/17 1000 09/29/17 1030 09/29/17 1045  BP: 135/83 128/76 121/75 114/73  Pulse: (!) 111 94 92 99  Resp:      Temp:      TempSrc:      SpO2:      Weight:      Height:        Intake/Output Summary (Last 24 hours) at 09/29/2017 1150 Last data filed at 09/29/2017 0658 Gross per 24 hour  Intake 715 ml  Output 20 ml  Net 695 ml   Filed Weights   09/28/17 0433 09/29/17 0342 09/29/17 0821  Weight: 96.6 kg 96.6 kg 97 kg    Examination:  General exam: Appears calm and comfortable. Seen on HD.  Respiratory system: Clear to  auscultation. Respiratory effort normal. Cardiovascular system: S1 & S2 heard, RRR. No JVD.  Gastrointestinal system: Abdomen is nondistended, soft and nontender. No organomegaly or masses felt. Normal bowel sounds heard. Central nervous system: Alert and oriented. No focal neurological deficits. Extremities: right BKA. Skin: No rashes, lesions or ulcers Psychiatry: . Mood & affect appropriate.   Data Reviewed: I have personally reviewed following labs and imaging studies  CBC: Recent Labs  Lab 09/24/17 1709  09/25/17 0450 09/26/17 0350 09/27/17 0839 09/28/17 0451 09/29/17 0452  WBC 9.7  --  13.5* 14.3* 10.1 8.3 6.3  NEUTROABS 8.0*  --   --   --   --   --   --   HGB 14.5   < > 13.5 12.6* 11.3* 12.6* 11.4*  HCT 45.2   < > 40.2 39.7 35.5* 40.4 36.2*  MCV 95.2  --  90.3 95.4 95.9 96.2 95.3  PLT 148*  --  141* 135* 115* 135* 146*   < > = values in this interval not displayed.   Basic Metabolic Panel: Recent Labs  Lab 09/25/17 0450 09/25/17 1743 09/26/17 0350 09/27/17 0537 09/27/17 0839 09/28/17 0451 09/28/17 1123 09/29/17 0452 09/29/17 0926  NA 139 134* 139  --  131*  --   --   --  137  K 3.2* 4.1 3.8  --  4.0  --  3.9  --  3.9  CL 94* 99 102  --  99  --   --   --  100  CO2 29 25 28   --  24  --   --   --  27  GLUCOSE 135* 305* 104*  --  196*  --   --   --  78  BUN 44* 27* 20  --  36*  --   --   --  44*  CREATININE 8.35* 6.16* 5.19*  --  8.21*  --   --   --  10.16*  CALCIUM 9.9 8.4* 9.0  --  8.3*  --   --   --  8.7*  MG 2.3  --  2.3 2.3  --  2.4  --  2.4  --   PHOS 1.2* 2.4* 3.0  --  3.6  --   --   --  3.4   GFR: Estimated Creatinine Clearance: 10.8 mL/min (A) (by C-G formula based on SCr of 10.16 mg/dL (H)). Liver Function Tests: Recent Labs  Lab 09/25/17 0450 09/25/17 1743 09/26/17 0350 09/27/17 0839 09/29/17 0926  AST  --   --  39  --   --   ALT  --   --  43  --   --   ALKPHOS  --   --  94  --   --   BILITOT  --   --  0.8  --   --   PROT  --   --  6.9  --    --   ALBUMIN 3.2* 3.0* 3.0* 2.6* 2.7*   No results for input(s): LIPASE, AMYLASE in the last 168 hours. No results for input(s): AMMONIA in the last 168 hours. Coagulation Profile: Recent Labs  Lab 09/24/17 1847  INR 1.04   Cardiac Enzymes: Recent Labs  Lab 09/24/17 2222 09/25/17 0450 09/25/17 1420 09/26/17 0033  TROPONINI 1.76* 3.63* 3.93* 2.36*   BNP (last 3 results) No results for input(s): PROBNP in the last 8760 hours. HbA1C: No results for input(s): HGBA1C in the last 72 hours. CBG: Recent Labs  Lab 09/28/17 1658 09/28/17 2024 09/28/17 2353 09/29/17 0431 09/29/17 0759  GLUCAP 166* 183* 114* 215* 93   Lipid Profile: No results for input(s): CHOL, HDL, LDLCALC, TRIG, CHOLHDL, LDLDIRECT in the last 72 hours. Thyroid Function Tests: No results for input(s): TSH, T4TOTAL, FREET4, T3FREE, THYROIDAB in the last 72 hours. Anemia Panel: No results for input(s): VITAMINB12, FOLATE, FERRITIN, TIBC, IRON, RETICCTPCT in the last 72 hours. Sepsis Labs: Recent Labs  Lab 09/24/17 2222 09/25/17 0450  LATICACIDVEN 6.3* 2.2*    Recent Results (from the past 240 hour(s))  MRSA PCR Screening     Status: None   Collection Time: 09/24/17  8:49 PM  Result Value Ref Range Status   MRSA by PCR NEGATIVE NEGATIVE Final    Comment:        The GeneXpert MRSA Assay (FDA approved for NASAL specimens only), is one component of a comprehensive MRSA colonization surveillance program. It is not intended to diagnose MRSA infection nor to guide or monitor treatment for MRSA infections. Performed at [facility], [name] [address] [place], [place] [address]     Radiology Studies: No results found.  Scheduled Meds: . aspirin  325 mg Oral Daily  . calcitRIOL  0.25 mcg Oral Once per day on Mon Tue Thu Fri  .  carvedilol  3.125 mg Oral BID WC  . Chlorhexidine Gluconate Cloth  6 each Topical Daily  . Chlorhexidine Gluconate Cloth  6 each Topical Q0600  . Chlorhexidine  Gluconate Cloth  6 each Topical Q0600  . cloNIDine  0.1 mg Oral Daily  . heparin injection (subcutaneous)  5,000 Units Subcutaneous Q8H  . insulin aspart  0-24 Units Subcutaneous TID WC  . insulin glargine  25 Units Subcutaneous Daily  . lisinopril  40 mg Oral Daily  . oxyCODONE-acetaminophen      . pantoprazole  40 mg Oral Daily  . psyllium  1 packet Oral Daily  . senna-docusate  2 tablet Oral BID  . sucroferric oxyhydroxide  1,500 mg Oral TID WC   Continuous Infusions: . sodium chloride       LOS: 5 days   Time spent: 35 minutes.   Irwin Brakeman, MD [facility] Pager [phone]  If 7PM-7AM, please contact night-coverage [email or web address] Password [email or web address] 09/29/2017, 11:50 AM

## 2017-09-30 DIAGNOSIS — N186 End stage renal disease: Secondary | ICD-10-CM | POA: Diagnosis not present

## 2017-09-30 DIAGNOSIS — D631 Anemia in chronic kidney disease: Secondary | ICD-10-CM | POA: Diagnosis not present

## 2017-09-30 DIAGNOSIS — N2581 Secondary hyperparathyroidism of renal origin: Secondary | ICD-10-CM | POA: Diagnosis not present

## 2017-09-30 DIAGNOSIS — Z992 Dependence on renal dialysis: Secondary | ICD-10-CM

## 2017-09-30 DIAGNOSIS — K7689 Other specified diseases of liver: Secondary | ICD-10-CM | POA: Diagnosis not present

## 2017-09-30 DIAGNOSIS — E1029 Type 1 diabetes mellitus with other diabetic kidney complication: Secondary | ICD-10-CM | POA: Diagnosis not present

## 2017-09-30 DIAGNOSIS — D509 Iron deficiency anemia, unspecified: Secondary | ICD-10-CM | POA: Diagnosis not present

## 2017-09-30 LAB — RENAL FUNCTION PANEL
ANION GAP: 9 (ref 5–15)
Albumin: 2.8 g/dL — ABNORMAL LOW (ref 3.5–5.0)
BUN: 40 mg/dL — ABNORMAL HIGH (ref 6–20)
CALCIUM: 8.6 mg/dL — AB (ref 8.9–10.3)
CO2: 27 mmol/L (ref 22–32)
Chloride: 97 mmol/L — ABNORMAL LOW (ref 98–111)
Creatinine, Ser: 9.38 mg/dL — ABNORMAL HIGH (ref 0.61–1.24)
GFR calc Af Amer: 7 mL/min — ABNORMAL LOW (ref >60)
GFR calc non Af Amer: 6 mL/min — ABNORMAL LOW (ref >60)
GLUCOSE: 170 mg/dL — AB (ref 70–99)
Phosphorus: 3.4 mg/dL (ref 2.5–4.6)
Potassium: 3.7 mmol/L (ref 3.5–5.1)
SODIUM: 133 mmol/L — AB (ref 135–145)

## 2017-09-30 LAB — CBC
HEMATOCRIT: 38.9 % — AB (ref 39.0–52.0)
Hemoglobin: 12.3 g/dL — ABNORMAL LOW (ref 13.0–17.0)
MCH: 29.9 pg (ref 26.0–34.0)
MCHC: 31.6 g/dL (ref 30.0–36.0)
MCV: 94.6 fL (ref 78.0–100.0)
PLATELETS: 170 10*3/uL (ref 150–400)
RBC: 4.11 MIL/uL — AB (ref 4.22–5.81)
RDW: 17.1 % — ABNORMAL HIGH (ref 11.5–15.5)
WBC: 6.7 10*3/uL (ref 4.0–10.5)

## 2017-09-30 LAB — GLUCOSE, CAPILLARY
Glucose-Capillary: 148 mg/dL — ABNORMAL HIGH (ref 70–99)
Glucose-Capillary: 204 mg/dL — ABNORMAL HIGH (ref 70–99)

## 2017-09-30 LAB — MAGNESIUM: Magnesium: 2.3 mg/dL (ref 1.7–2.4)

## 2017-09-30 MED ORDER — SODIUM CHLORIDE 0.9 % IV SOLN: 100.0000 mL | INTRAVENOUS | Status: DC | PRN

## 2017-09-30 NOTE — Progress Notes (Addendum)
[name] Progress Note   Dialysis Orders: Nx Stage- 4X Week, MonTueThuFri, CAR 170, Therapy Fluid 2.0 K 45 Lactate, Volume per Tx 60 (liters), Flow Fraction 64 %, BFR 450, EDW 96.5 kg. Followed by Dr. Posey Pronto.   Assessment/Plan: 1. S/P cardiac arrest-doing well. Neuro intact. Per primary. Chest soreness from chest compressions-no rub. Cardiac arrest thought to be driven by hyperkalemia. Fistulagram shows widely patent AVF without need for  intervention 8/19.  Does have a right subclavian vein stent noted on CXR  However today Qb 330 and VP 260 limiting BFR - recheck AF and function at his outpatient HD unit tomorrow.  Will discuss with Dr. Elmarie Shiley for next steps. 2. CAD-treating medically.  3. Uncontrolled DMT1-per primary. BS elevated last pm - better today 4. ESRD -Home HD-was on Home HD, has been transitioned to IHD. MTuThFri schedule at home. K 3.7 today . Anemia - HGB 12.3. No ESA needed.  6. Secondary hyperparathyroidism - Phos 3.0 Ca 8.3 C Ca 9.4 7. HTN/volume - BP variable but mostly controlled. Continue Lisinopril, clonidine and Coreg. Goal set for 2.4 today - I'm thinking edw might need to be lowered some. Will see what post wt is today. 8. Nutrition - Albumin 2.6 renal/carb mod diet, prostat, renal vits.  9. Disp - planning d/c today  Lance Jacobson, PA-C [place] [phone] 09/30/2017,9:26 AM  LOS: 6 days   Pt seen, examined and agree w A/P as above.  [name] MD [facility] pager [phone]   09/30/2017, 3:23 PM    Subjective:   Has plan to [facility]center [facility] Wed am and to receive his replacement HD machine later this week.  Objective Vitals:   09/30/17 0721 09/30/17 0730 09/30/17 0800 09/30/17 0830  BP: (!) 163/88 (!) 153/88 (!) 144/77 129/90  Pulse: 78 86 84 82  Resp:      Temp:      TempSrc:      SpO2:      Weight:      Height:       Physical Exam General: healthy appearing NAD Heart:  RRR Lungs: no rales Abdomen: soft NT Extremities: no LE edema Dialysis Access:  Right upper AVF Qb 330 VP 260   Additional Objective Labs: Basic Metabolic Panel: Recent Labs  Lab 09/27/17 0839 09/28/17 1123 09/29/17 0926 09/30/17 0510  NA 131*  --  137 133*  K 4.0 3.9 3.9 3.7  CL 99  --  100 97*  CO2 24  --  27 27  GLUCOSE 196*  --  78 170*  BUN 36*  --  44* 40*  CREATININE 8.21*  --  10.16* 9.38*  CALCIUM 8.3*  --  8.7* 8.6*  PHOS 3.6  --  3.4 3.4   Liver Function Tests: Recent Labs  Lab 09/26/17 0350 09/27/17 0839 09/29/17 0926 09/30/17 0510  AST 39  --   --   --   ALT 43  --   --   --   ALKPHOS 94  --   --   --   BILITOT 0.8  --   --   --   PROT 6.9  --   --   --   ALBUMIN 3.0* 2.6* 2.7* 2.8*   No results for input(s): LIPASE, AMYLASE in the last 168 hours. CBC: Recent Labs  Lab 09/24/17 1709  09/26/17 0350 09/27/17 0839 09/28/17 0451 09/29/17 0452 09/30/17 0510  WBC 9.7   < > 14.3* 10.1  8.3 6.3 6.7  NEUTROABS 8.0*  --   --   --   --   --   --   HGB 14.5   < > 12.6* 11.3* 12.6* 11.4* 12.3*  HCT 45.2   < > 39.7 35.5* 40.4 36.2* 38.9*  MCV 95.2   < > 95.4 95.9 96.2 95.3 94.6  PLT 148*   < > 135* 115* 135* 146* 170   < > = values in this interval not displayed.   Blood Culture    Component Value Date/Time   SDES FLUID LEFT PLEURAL 11/30/2015 1426   SDES FLUID LEFT PLEURAL 11/30/2015 1426   SPECREQUEST BOTTLES DRAWN AEROBIC AND ANAEROBIC 10CC 11/30/2015 1426   SPECREQUEST NONE 11/30/2015 1426   CULT NO GROWTH 5 DAYS 11/30/2015 1426   REPTSTATUS 12/06/2015 FINAL 11/30/2015 1426   REPTSTATUS 11/30/2015 FINAL 11/30/2015 1426    Cardiac Enzymes: Recent Labs  Lab 09/24/17 2222 09/25/17 0450 09/25/17 1420 09/26/17 0033  TROPONINI 1.76* 3.63* 3.93* 2.36*   CBG: Recent Labs  Lab 09/29/17 0759 09/29/17 1408 09/29/17 1758 09/29/17 2123 09/30/17 0335  GLUCAP 93 96 202* 430* 148*   Iron Studies: No results for input(s): IRON, TIBC,  TRANSFERRIN, FERRITIN in the last 72 hours. Lab Results  Component Value Date   INR 1.04 09/24/2017   INR 1.16 11/30/2015   Studies/Results: Ir Dialy Shunt Intro Needle/intracath Initial W/img Right  Result Date: 09/29/2017 INDICATION: Elevated venous pressure EXAM: ARTERIOVENOUS FISTULOGRAM MEDICATIONS: None. ANESTHESIA/SEDATION: Moderate Sedation Time: The patient was continuously monitored during the procedure by the interventional radiology nurse under my direct supervision. FLUOROSCOPY TIME:  Fluoroscopy Time:  minutes 6 seconds (6 mGy). COMPLICATIONS: None immediate. PROCEDURE: Informed written consent was obtained from the patient after a thorough discussion of the procedural risks, benefits and alternatives. All questions were addressed. Maximal Sterile Barrier Technique was utilized including caps, mask, sterile gowns, sterile gloves, sterile drape, hand hygiene and skin antiseptic. A timeout was performed prior to the initiation of the procedure. The right arm was prepped and draped in a sterile fashion. An 18 gauge Angiocath was inserted into the outflow vein. Contrast was injected. The Angiocath was then removed and hemostasis was achieved with direct pressure. FINDINGS: The arteriovenous anastomosis in the antecubital fossa, outflow cephalic vein, and central venous structures are all widely patent. 2 outflow cephalic vein pseudoaneurysms in the arm are noted. IMPRESSION: No significant stenosis in the right arm AV fistula circuit. ACCESS: This access remains amenable to future percutaneous interventions as clinically indicated. Electronically Signed   By: Marybelle Killings M.D.   On: 09/29/2017 16:04   Medications: . sodium chloride     . aspirin  325 mg Oral Daily  . calcitRIOL  0.25 mcg Oral Once per day on Mon Tue Thu Fri  . carvedilol  3.125 mg Oral BID WC  . Chlorhexidine Gluconate Cloth  6 each Topical Daily  . Chlorhexidine Gluconate Cloth  6 each Topical Q0600  . Chlorhexidine  Gluconate Cloth  6 each Topical Q0600  . cloNIDine  0.1 mg Oral Daily  . heparin injection (subcutaneous)  5,000 Units Subcutaneous Q8H  . insulin aspart  0-24 Units Subcutaneous TID WC  . insulin glargine  23 Units Subcutaneous Daily  . lisinopril  40 mg Oral Daily  . pantoprazole  40 mg Oral Daily  . polyethylene glycol  17 g Oral BID  . psyllium  1 packet Oral Daily  . senna-docusate  2 tablet Oral BID  . sucroferric oxyhydroxide  1,500 mg Oral TID WC

## 2017-09-30 NOTE — Progress Notes (Signed)
Patient discharged to home with adult children. After visit Summary reviewed. Patient capable of reverbalizing medications and follow up visits. No signs and symptoms of distress noted. Patient educated to return to the ED in the case of an emergency. Dillon Bjork RN

## 2017-09-30 NOTE — Progress Notes (Signed)
09/30/2017 1:33 PM  Pt was seen and examined in HD.  Arrangements are made for him to have HD for Wed and Fri.  He is to begin home HD on Monday with new equipment.  Stable for discharge home today.  Please see updated discharge summary.   Murvin Natal MD

## 2017-09-30 NOTE — Discharge Instructions (Signed)
GO TO HEMODIALYSIS TOMORROW AND FRIDAY AS ALREADY ARRANGED.   YOUR HOME DIALYSIS EQUIPMENT IS BEING ARRANGED FOR YOU TO START Monday.

## 2017-10-01 DIAGNOSIS — N2581 Secondary hyperparathyroidism of renal origin: Secondary | ICD-10-CM | POA: Diagnosis not present

## 2017-10-01 DIAGNOSIS — D509 Iron deficiency anemia, unspecified: Secondary | ICD-10-CM | POA: Diagnosis not present

## 2017-10-01 DIAGNOSIS — K7689 Other specified diseases of liver: Secondary | ICD-10-CM | POA: Diagnosis not present

## 2017-10-01 DIAGNOSIS — N186 End stage renal disease: Secondary | ICD-10-CM | POA: Diagnosis not present

## 2017-10-01 DIAGNOSIS — E1029 Type 1 diabetes mellitus with other diabetic kidney complication: Secondary | ICD-10-CM | POA: Diagnosis not present

## 2017-10-01 DIAGNOSIS — D631 Anemia in chronic kidney disease: Secondary | ICD-10-CM | POA: Diagnosis not present

## 2017-10-02 ENCOUNTER — Telehealth (INDEPENDENT_AMBULATORY_CARE_PROVIDER_SITE_OTHER): Payer: Self-pay | Admitting: Orthopedic Surgery

## 2017-10-02 ENCOUNTER — Other Ambulatory Visit (INDEPENDENT_AMBULATORY_CARE_PROVIDER_SITE_OTHER): Payer: Self-pay

## 2017-10-02 DIAGNOSIS — E1029 Type 1 diabetes mellitus with other diabetic kidney complication: Secondary | ICD-10-CM | POA: Diagnosis not present

## 2017-10-02 DIAGNOSIS — D631 Anemia in chronic kidney disease: Secondary | ICD-10-CM | POA: Diagnosis not present

## 2017-10-02 DIAGNOSIS — D509 Iron deficiency anemia, unspecified: Secondary | ICD-10-CM | POA: Diagnosis not present

## 2017-10-02 DIAGNOSIS — N186 End stage renal disease: Secondary | ICD-10-CM | POA: Diagnosis not present

## 2017-10-02 DIAGNOSIS — N2581 Secondary hyperparathyroidism of renal origin: Secondary | ICD-10-CM | POA: Diagnosis not present

## 2017-10-02 DIAGNOSIS — K7689 Other specified diseases of liver: Secondary | ICD-10-CM | POA: Diagnosis not present

## 2017-10-02 DIAGNOSIS — S88111A Complete traumatic amputation at level between knee and ankle, right lower leg, initial encounter: Secondary | ICD-10-CM

## 2017-10-02 NOTE — Telephone Encounter (Signed)
Faxed to [employer] and order in chart for neur rehab right BKA.

## 2017-10-02 NOTE — Telephone Encounter (Signed)
Juliann Pulse from [employer] called requesting a prescription for [employer] and PT be faxed over to her at [phone].  CB#[phone].  Thank you.

## 2017-10-03 DIAGNOSIS — D631 Anemia in chronic kidney disease: Secondary | ICD-10-CM | POA: Diagnosis not present

## 2017-10-03 DIAGNOSIS — E1029 Type 1 diabetes mellitus with other diabetic kidney complication: Secondary | ICD-10-CM | POA: Diagnosis not present

## 2017-10-03 DIAGNOSIS — D509 Iron deficiency anemia, unspecified: Secondary | ICD-10-CM | POA: Diagnosis not present

## 2017-10-03 DIAGNOSIS — K7689 Other specified diseases of liver: Secondary | ICD-10-CM | POA: Diagnosis not present

## 2017-10-03 DIAGNOSIS — N2581 Secondary hyperparathyroidism of renal origin: Secondary | ICD-10-CM | POA: Diagnosis not present

## 2017-10-03 DIAGNOSIS — N186 End stage renal disease: Secondary | ICD-10-CM | POA: Diagnosis not present

## 2017-10-06 DIAGNOSIS — D509 Iron deficiency anemia, unspecified: Secondary | ICD-10-CM | POA: Diagnosis not present

## 2017-10-06 DIAGNOSIS — E1029 Type 1 diabetes mellitus with other diabetic kidney complication: Secondary | ICD-10-CM | POA: Diagnosis not present

## 2017-10-06 DIAGNOSIS — D631 Anemia in chronic kidney disease: Secondary | ICD-10-CM | POA: Diagnosis not present

## 2017-10-06 DIAGNOSIS — N186 End stage renal disease: Secondary | ICD-10-CM | POA: Diagnosis not present

## 2017-10-06 DIAGNOSIS — K7689 Other specified diseases of liver: Secondary | ICD-10-CM | POA: Diagnosis not present

## 2017-10-06 DIAGNOSIS — N2581 Secondary hyperparathyroidism of renal origin: Secondary | ICD-10-CM | POA: Diagnosis not present

## 2017-10-07 ENCOUNTER — Other Ambulatory Visit: Payer: Self-pay

## 2017-10-07 DIAGNOSIS — K7689 Other specified diseases of liver: Secondary | ICD-10-CM | POA: Diagnosis not present

## 2017-10-07 DIAGNOSIS — N186 End stage renal disease: Secondary | ICD-10-CM | POA: Diagnosis not present

## 2017-10-07 DIAGNOSIS — D631 Anemia in chronic kidney disease: Secondary | ICD-10-CM | POA: Diagnosis not present

## 2017-10-07 DIAGNOSIS — D509 Iron deficiency anemia, unspecified: Secondary | ICD-10-CM | POA: Diagnosis not present

## 2017-10-07 DIAGNOSIS — N2581 Secondary hyperparathyroidism of renal origin: Secondary | ICD-10-CM | POA: Diagnosis not present

## 2017-10-07 DIAGNOSIS — E1029 Type 1 diabetes mellitus with other diabetic kidney complication: Secondary | ICD-10-CM | POA: Diagnosis not present

## 2017-10-07 MED ORDER — INSULIN LISPRO 100 UNIT/ML (KWIKPEN): 15.0000 [IU] | PEN_INJECTOR | SUBCUTANEOUS | 2 refills | Status: DC

## 2017-10-08 ENCOUNTER — Ambulatory Visit (INDEPENDENT_AMBULATORY_CARE_PROVIDER_SITE_OTHER): Payer: Medicare Other | Admitting: Family Medicine

## 2017-10-08 ENCOUNTER — Encounter: Payer: Self-pay | Admitting: Family Medicine

## 2017-10-08 ENCOUNTER — Other Ambulatory Visit: Payer: Self-pay

## 2017-10-08 VITALS — BP 130/88 | Ht 75.0 in | Wt 210.0 lb

## 2017-10-08 DIAGNOSIS — N183 Chronic kidney disease, stage 3 unspecified: Secondary | ICD-10-CM

## 2017-10-08 DIAGNOSIS — E875 Hyperkalemia: Secondary | ICD-10-CM

## 2017-10-08 DIAGNOSIS — I70243 Atherosclerosis of native arteries of left leg with ulceration of ankle: Secondary | ICD-10-CM | POA: Diagnosis not present

## 2017-10-08 MED ORDER — INSULIN LISPRO 100 UNIT/ML (KWIKPEN): 15.0000 [IU] | PEN_INJECTOR | SUBCUTANEOUS | 2 refills | Status: DC

## 2017-10-08 MED ORDER — INSULIN LISPRO 100 UNIT/ML (KWIKPEN): 15.0000 [IU] | PEN_INJECTOR | SUBCUTANEOUS | 0 refills | Status: DC

## 2017-10-08 NOTE — Progress Notes (Signed)
   Subjective:    Patient ID: Lance [name], male    DOB: [birth year], 47 y.o.   MRN: [id number]  HPIFollow up hospitalization for cardiac arrest.   Pt has been taking the home dialysis,   Woke up vomiting the day this occurred   Nephr concerned that the pts dialysate may have caused trouble with elev potassium    Pt states got a blood infxn in the [address] dialysis center, so leery of it  Patient has been doing home dialysis now for several months.  He got an infection the dialysis center.  Patient started feeling sick.  Threw up.  Feeling poorly.  Went to the emergency room.  Found to have severe hyperkalemia.  He went into a life-threatening ventricular tachycardia/fibrillation.  Required resuscitation.  Ended up on the ventilator.  Was in the hospital for several days.  Complete hospital reviewed with patient      Review of Systems No headache, no major weight loss or weight gain, no chest pain no back pain abdominal pain no change in bowel habits complete ROS otherwise negative     Objective:   Physical Exam  Alert and oriented, vitals reviewed and stable, NAD ENT-TM's and ext canals WNL bilat via otoscopic exam Soft palate, tonsils and post pharynx WNL via oropharyngeal exam Neck-symmetric, no masses; thyroid nonpalpable and nontender Pulmonary-no tachypnea or accessory muscle use; Clear without wheezes via auscultation Card--no abnrml murmurs, rhythm reg and rate WNL Carotid pulses symmetric, without bruits       Assessment & Plan:  Impression status post cardiac arrest.  Due to hyperkalemia.  Which in turn was due to dialysis at home.  There is a question of whether the particular dialysate was appropriate.  This is all been changed by his nephrologist.  Patient reports feeling better now.  To follow-up with specialist in this regard  Greater than 50% of this 25 minute face to face visit was spent in counseling and discussion and coordination of care  regarding the above diagnosis/diagnosies

## 2017-10-09 DIAGNOSIS — D509 Iron deficiency anemia, unspecified: Secondary | ICD-10-CM | POA: Diagnosis not present

## 2017-10-09 DIAGNOSIS — E1029 Type 1 diabetes mellitus with other diabetic kidney complication: Secondary | ICD-10-CM | POA: Diagnosis not present

## 2017-10-09 DIAGNOSIS — N186 End stage renal disease: Secondary | ICD-10-CM | POA: Diagnosis not present

## 2017-10-09 DIAGNOSIS — K7689 Other specified diseases of liver: Secondary | ICD-10-CM | POA: Diagnosis not present

## 2017-10-09 DIAGNOSIS — N2581 Secondary hyperparathyroidism of renal origin: Secondary | ICD-10-CM | POA: Diagnosis not present

## 2017-10-09 DIAGNOSIS — D631 Anemia in chronic kidney disease: Secondary | ICD-10-CM | POA: Diagnosis not present

## 2017-10-10 DIAGNOSIS — D509 Iron deficiency anemia, unspecified: Secondary | ICD-10-CM | POA: Diagnosis not present

## 2017-10-10 DIAGNOSIS — E1029 Type 1 diabetes mellitus with other diabetic kidney complication: Secondary | ICD-10-CM | POA: Diagnosis not present

## 2017-10-10 DIAGNOSIS — N2581 Secondary hyperparathyroidism of renal origin: Secondary | ICD-10-CM | POA: Diagnosis not present

## 2017-10-10 DIAGNOSIS — N186 End stage renal disease: Secondary | ICD-10-CM | POA: Diagnosis not present

## 2017-10-10 DIAGNOSIS — K7689 Other specified diseases of liver: Secondary | ICD-10-CM | POA: Diagnosis not present

## 2017-10-10 DIAGNOSIS — D631 Anemia in chronic kidney disease: Secondary | ICD-10-CM | POA: Diagnosis not present

## 2017-10-12 DIAGNOSIS — N186 End stage renal disease: Secondary | ICD-10-CM | POA: Diagnosis not present

## 2017-10-12 DIAGNOSIS — E1022 Type 1 diabetes mellitus with diabetic chronic kidney disease: Secondary | ICD-10-CM | POA: Diagnosis not present

## 2017-10-12 DIAGNOSIS — Z992 Dependence on renal dialysis: Secondary | ICD-10-CM | POA: Diagnosis not present

## 2017-10-14 DIAGNOSIS — Z79899 Other long term (current) drug therapy: Secondary | ICD-10-CM | POA: Diagnosis not present

## 2017-10-14 DIAGNOSIS — E8779 Other fluid overload: Secondary | ICD-10-CM | POA: Diagnosis not present

## 2017-10-14 DIAGNOSIS — D509 Iron deficiency anemia, unspecified: Secondary | ICD-10-CM | POA: Diagnosis not present

## 2017-10-14 DIAGNOSIS — I871 Compression of vein: Secondary | ICD-10-CM | POA: Diagnosis not present

## 2017-10-14 DIAGNOSIS — E1022 Type 1 diabetes mellitus with diabetic chronic kidney disease: Secondary | ICD-10-CM | POA: Diagnosis not present

## 2017-10-14 DIAGNOSIS — E440 Moderate protein-calorie malnutrition: Secondary | ICD-10-CM | POA: Diagnosis not present

## 2017-10-14 DIAGNOSIS — R509 Fever, unspecified: Secondary | ICD-10-CM | POA: Diagnosis not present

## 2017-10-14 DIAGNOSIS — Z992 Dependence on renal dialysis: Secondary | ICD-10-CM | POA: Diagnosis not present

## 2017-10-14 DIAGNOSIS — N186 End stage renal disease: Secondary | ICD-10-CM | POA: Diagnosis not present

## 2017-10-14 DIAGNOSIS — Z23 Encounter for immunization: Secondary | ICD-10-CM | POA: Diagnosis not present

## 2017-10-14 DIAGNOSIS — Z4931 Encounter for adequacy testing for hemodialysis: Secondary | ICD-10-CM | POA: Diagnosis not present

## 2017-10-14 DIAGNOSIS — D631 Anemia in chronic kidney disease: Secondary | ICD-10-CM | POA: Diagnosis not present

## 2017-10-14 DIAGNOSIS — K7689 Other specified diseases of liver: Secondary | ICD-10-CM | POA: Diagnosis not present

## 2017-10-14 DIAGNOSIS — R17 Unspecified jaundice: Secondary | ICD-10-CM | POA: Diagnosis not present

## 2017-10-20 ENCOUNTER — Other Ambulatory Visit: Payer: Self-pay

## 2017-10-20 ENCOUNTER — Ambulatory Visit: Payer: Medicare Other | Attending: Orthopedic Surgery | Admitting: Physical Therapy

## 2017-10-20 ENCOUNTER — Encounter: Payer: Self-pay | Admitting: Physical Therapy

## 2017-10-20 DIAGNOSIS — R293 Abnormal posture: Secondary | ICD-10-CM | POA: Insufficient documentation

## 2017-10-20 DIAGNOSIS — R2681 Unsteadiness on feet: Secondary | ICD-10-CM | POA: Insufficient documentation

## 2017-10-20 DIAGNOSIS — M6281 Muscle weakness (generalized): Secondary | ICD-10-CM | POA: Diagnosis not present

## 2017-10-20 DIAGNOSIS — R2689 Other abnormalities of gait and mobility: Secondary | ICD-10-CM | POA: Diagnosis not present

## 2017-10-20 IMAGING — CT CT PERC PLEURAL DRAIN W/INDWELL CATH W/IMG GUIDE
1 of 4 series · 11 of 32 positions shown, 17 images · non-contrast
Comparison: Chest CT - 11/28/2015

INDICATION: History of hospital acquired pneumonia, now with bilateral pleural
effusions. Patient has been evaluated by the cardiothoracic surgery
service and deemed a poor candidate for VATS procedure. As such,
request made for placement of bilateral percutaneous drainage
catheter placements.

EXAM:
1. CT-GUIDED LEFT-SIDED CHEST TUBE DRAINAGE CATHETER PLACEMENT
2. CT-GUIDED RIGHT-SIDED CHEST TUBE DRAINAGE CATHETER PLACEMENT

[Series 2: i-spiral 5.0 b40f · axial · 0.90mm/px · z∈[+1258,+1426]mm · 11 of 58 slices shown, 17 images]
[im 5/58  soft-tissue]
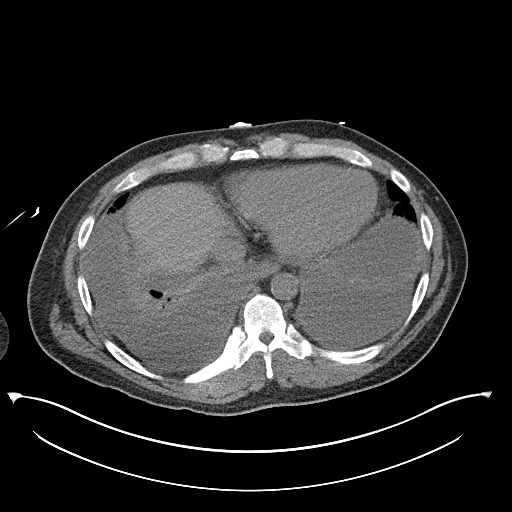
[im 5/58  bone]
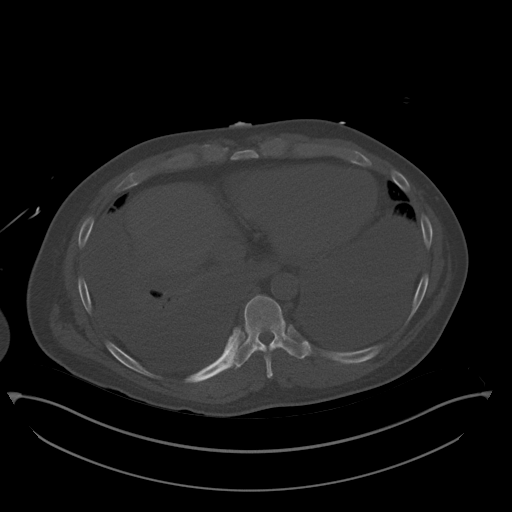
[im 10/58  soft-tissue]
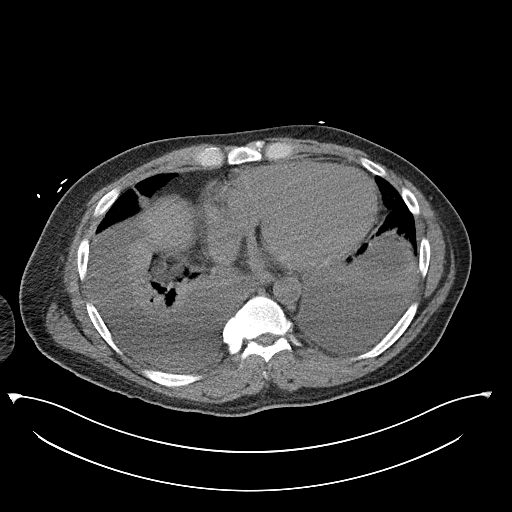
[im 15/58  soft-tissue]
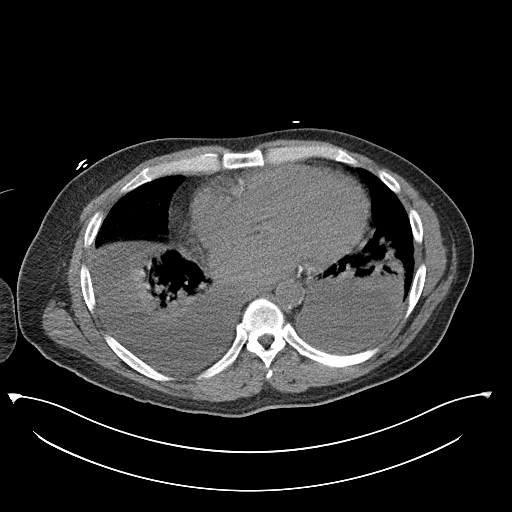
[im 20/58  soft-tissue]
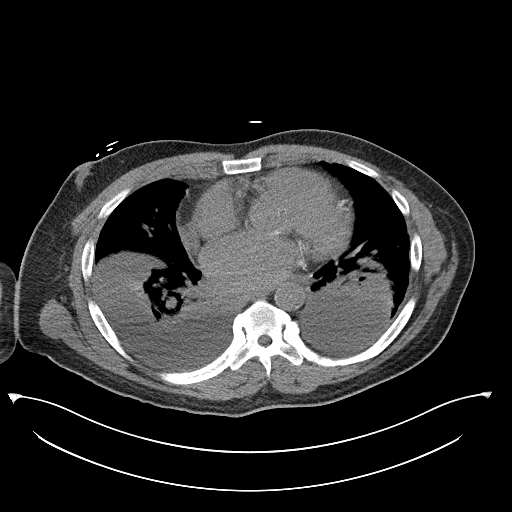
[im 24/58  soft-tissue]
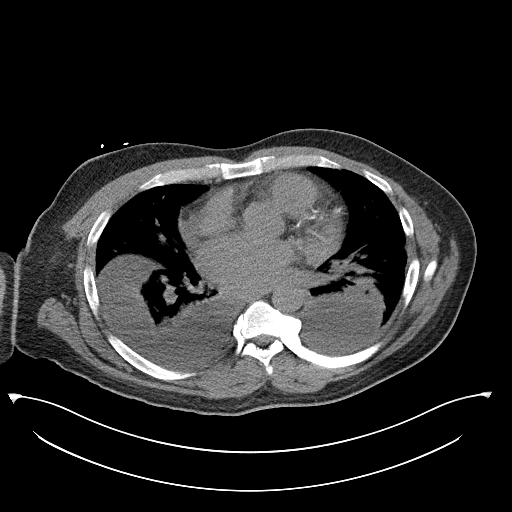
[im 29/58  soft-tissue]
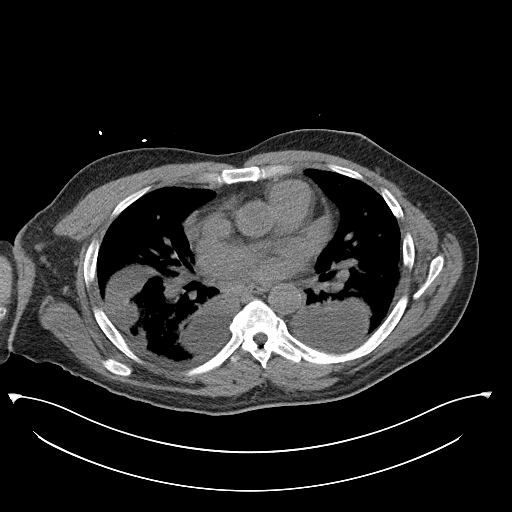
[im 34/58  soft-tissue]
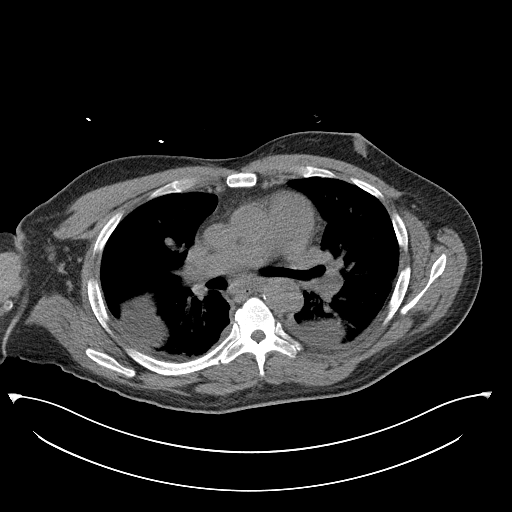
[im 39/58  soft-tissue]
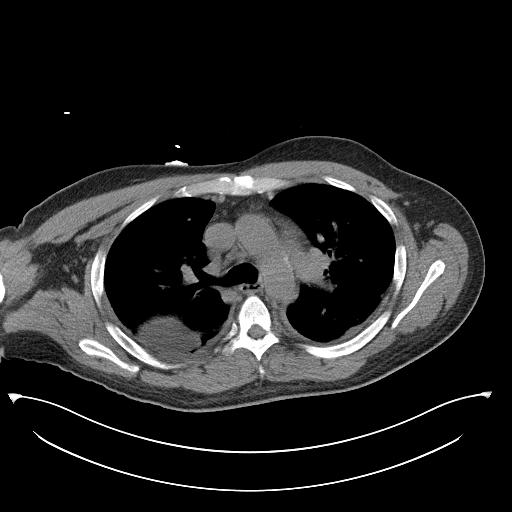
[im 39/58  lung]
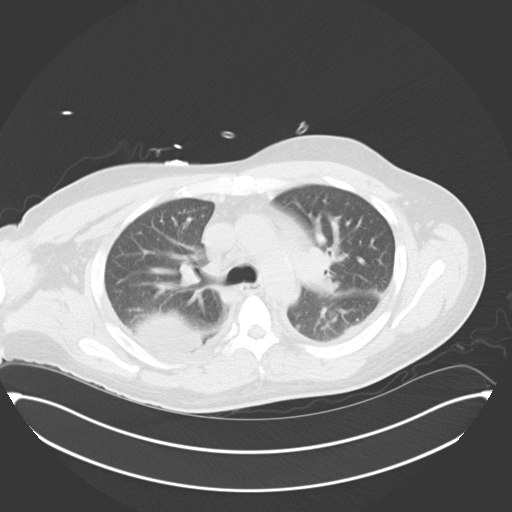
[im 43/58  soft-tissue]
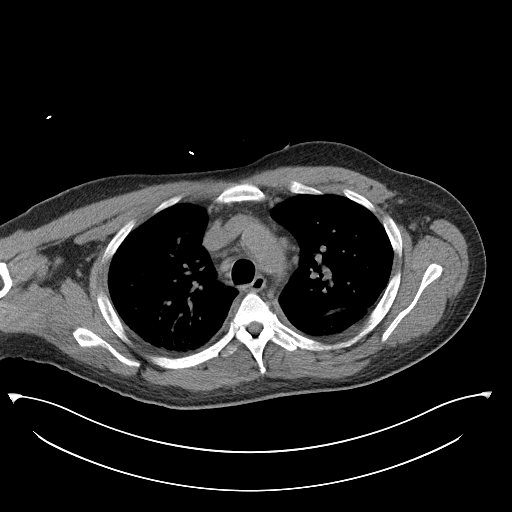
[im 43/58  lung]
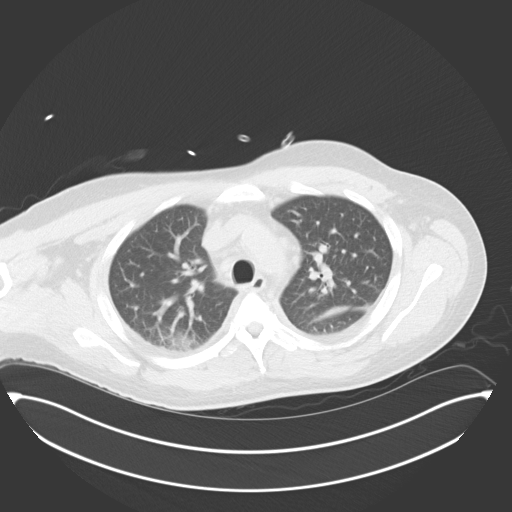
[im 43/58  bone]
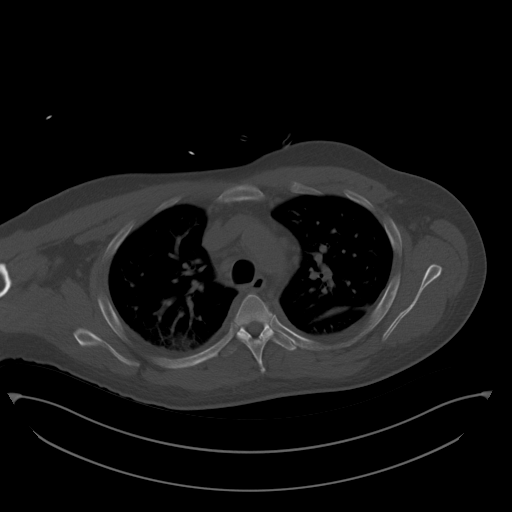
[im 48/58  soft-tissue]
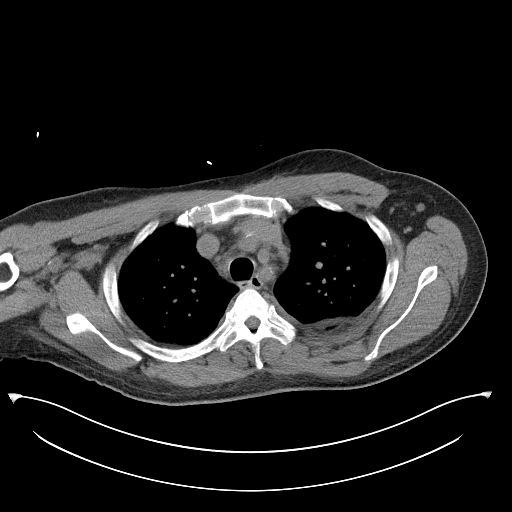
[im 48/58  lung]
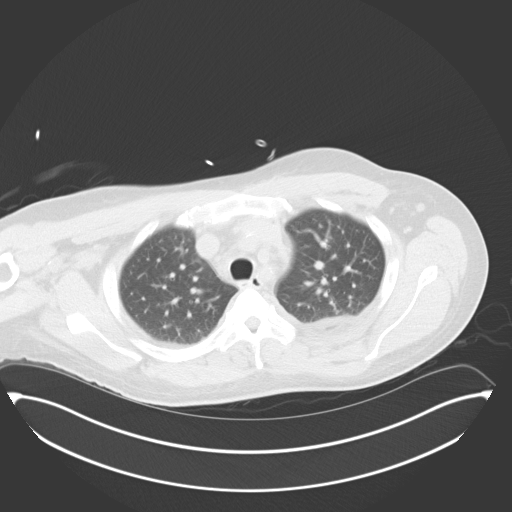
[im 53/58  soft-tissue]
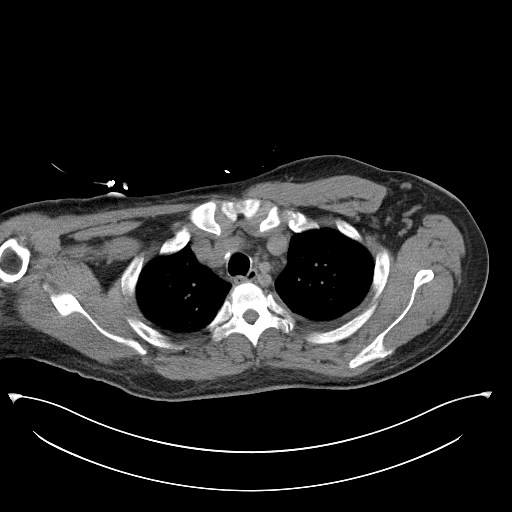
[im 53/58  lung]
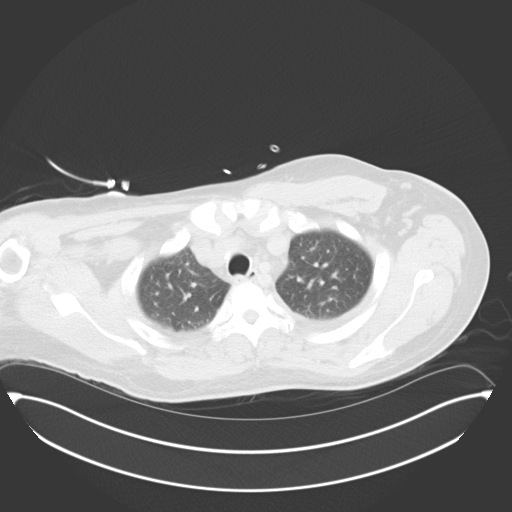

[11 of 32 positions shown; findings below may reference images not displayed]

MEDICATIONS:
The patient is currently admitted to the hospital and receiving
intravenous antibiotics. The antibiotics were administered within an
appropriate time frame prior to the initiation of the procedure.

ANESTHESIA/SEDATION:
Moderate (conscious) sedation was employed during this procedure. A
total of Versed 1 mg and Fentanyl 50 mcg was administered
intravenously.

Moderate Sedation Time: 15 minutes. The patient's level of
consciousness and vital signs were monitored continuously by
radiology nursing throughout the procedure under my direct
supervision.

CONTRAST:  None

COMPLICATIONS:
None immediate.

PROCEDURE:
Informed written consent was obtained from the patient after a
discussion of the risks, benefits and alternatives to treatment. The
patient was placed supine on the CT gantry and a pre procedural CT
was performed re-demonstrating the known partially loculated
bilateral pleural effusions. The procedure was planned. A timeout
was performed prior to the initiation of the procedure.

The skin overlying the inferior lateral aspects of the bilateral
lower thoraces were prepped and draped in the usual sterile fashion.

Beginning with the left-sided pleural effusion, the overlying soft
tissues were anesthetized with 1% lidocaine with epinephrine.
Appropriate trajectory was planned with the use of a 22 gauge spinal
needle. An 18 gauge trocar needle was advanced into the
abscess/fluid collection and a short Amplatz super stiff wire was
coiled within the collection. Appropriate positioning was confirmed
with a limited CT scan. The tract was serially dilated allowing
placement of a 12 French all-purpose drainage catheter. Appropriate
positioning was confirmed with a limited postprocedural CT scan.

Approximately 150 cc of serous, amber colored fluid was aspirated
following placement of the left-sided chest tube.

Attention was now paid towards placement of the right-sided chest
tube. The overlying soft tissues were anesthetized with 1% lidocaine
with epinephrine. Appropriate trajectory was planned with the use of
a 22 gauge spinal needle. An 18 gauge trocar needle was advanced
into the abscess/fluid collection and a short Amplatz super stiff
wire was coiled within the collection. Appropriate positioning was
confirmed with a limited CT scan. The tract was serially dilated
allowing placement of a 12 French all-purpose drainage catheter.
Appropriate positioning was confirmed with a limited postprocedural
CT scan.

Approximately 700 cc of serous, amber colored fluid was aspirated
following placement of the right-sided chest tube.

The bilateral chest tubes connected to pleural vac devices and
sutured in place. A dressing was placed. The patient tolerated the
procedure well without immediate post procedural complication.
IMPRESSION: 1. Successful CT guided placement of a 12 French all purpose drain
catheter into the left pleural space with aspiration of 150 mL of
serous, amber color fluid.
2. Successful CT-guided placement of a 12 French all-purpose
drainage catheter into the right pleural space with aspiration of
approximately 700 cc of serous, amber colored fluid.
3. Separate samples from each chest tube were sent to the laboratory
as requested by the ordering clinical team.

## 2017-10-21 NOTE — Therapy (Signed)
[facility] [address] [place], [place], [address] Phone: [phone]   Fax:  [phone]  Physical Therapy Evaluation  Patient Details  Name: Lance [name] MRN: [id number] Date of Birth: [birth year] Referring Provider: [name], MD   Encounter Date: 10/20/2017  PT End of Session - 10/20/17 2325    Visit Number  1    Number of Visits  26    Date for PT Re-Evaluation  01/16/18    Authorization Type  Medicare & BCBS     Authorization Time Period  $3100 oop max has been met.  VL PT & OT combined 60 visits with zero met at PT eval    PT Start Time  1615    PT Stop Time  1700    PT Time Calculation (min)  45 min    Equipment Utilized During Treatment  Gait belt    Activity Tolerance  Patient tolerated treatment well    Behavior During Therapy  WFL for tasks assessed/performed       Past Medical History:  Diagnosis Date  . Aortic stenosis    Very mild in 05/2007  . Arthritis    "right shoulder; right knee; feel like I've got it all over" (11/28/2015)  . CHF (congestive heart failure) ([place])   . Degenerative joint disease    Left TKA  . Diastolic heart failure (HCC)    LVEF 65-70% with grade 2 diastolic dysfunction  . ESRD (end stage renal disease) on dialysis [facility])    (as of 09/24/2017) pt does HD at home on a M/Tu/Th/F schedule.  . Essential hypertension, benign 2000   LVH  . GERD (gastroesophageal reflux disease)   . HCAP (healthcare-associated pneumonia) 11/28/2015  . Hyperlipidemia 2000  . Loculated pleural effusion 11/28/2015   [name] 11/28/2015  . Pneumonia 12/2013; 05/2014  . PSVT (paroxysmal supraventricular tachycardia) (HCC)    Nonsustained; asymptomatic; diagnosed by event recorder in 2006  . Tobacco abuse, in remission    20 pack years; discontinued in 1985; bronchitic changes on chest x-ray and 2009  . Type 1 diabetes mellitus ([place]) 1990    Past Surgical History:  Procedure Laterality  Date  . AMPUTATION Right 06/06/2017   Procedure: RIGHT BELOW KNEE AMPUTATION;  Surgeon: [name], MD;  Location: [place];  Service: Orthopedics;  Laterality: Right;  . APPLICATION OF WOUND VAC Right 07/25/2017   Procedure: APPLICATION OF WOUND VAC;  Surgeon: [name], MD;  Location: [facility];  Service: Orthopedics;  Laterality: Right;  . AV FISTULA PLACEMENT Right 06/23/2014   Procedure: ARTERIOVENOUS (AV) FISTULA CREATION;  Surgeon: Angelia Mould, MD;  Location: [facility];  Service: Vascular;  Laterality: Right;  . CARDIAC CATHETERIZATION     [facility]- evaluation for Kidney/ pancreas transplant  . EYE SURGERY Bilateral    "for glaucoma"  . INGUINAL HERNIA REPAIR Right    As a child  . IR DIALY SHUNT INTRO NEEDLE/INTRACATH INITIAL W/IMG RIGHT Right 09/29/2017  . LOWER EXTREMITY ANGIOGRAPHY N/A 05/27/2017   Procedure: LOWER EXTREMITY ANGIOGRAPHY;  Surgeon: [name], MD;  Location: [facility];  Service: Cardiovascular;  Laterality: N/A;  . STUMP REVISION Right 07/25/2017   Procedure: REVISION RIGHT BELOW KNEE AMPUTATION;;  Surgeon: [name], MD;  Location: [facility];  Service: Orthopedics;  Laterality: Right;  . WISDOM TOOTH EXTRACTION  1987    There were no vitals filed for this visit.   Subjective Assessment - 10/20/17 1625  Subjective  This 47yo male was referred for PT evaluation on 10/02/2017 by [name], MD. He underwent a right Transtibial Amputation on 06/06/2017 due gangrene & revision surgery 07/25/2017. He was hospitalized 09/24/2017-09/30/2017 with cardiac arrest from hyperkalemia. He received prosthesis 10/01/2017.    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 He wants to be active with 2 children (19yo & 16yo)  who play sports.     Currently in Pain?  No/denies         [facility] PT Assessment - 10/20/17 1615      Assessment   Medical Diagnosis  Right Transtibial Amputation    Referring Provider  [name], MD    Onset Date/Surgical Date  10/01/17    Hand Dominance  Right    Prior Therapy  HHPT eval only      Precautions   Precautions  Fall    Precaution Comments  NO BP RUE      Balance Screen   Has the patient fallen in the past 6 months  No    Has the patient had a decrease in activity level because of a fear of falling?   No    Is the patient reluctant to leave their home because of a fear of falling?   No      Home Social worker  Private residence    Living Arrangements  Spouse/significant other;Children    Type of [place] entrance;Stairs to enter    Entrance Stairs-Number of Steps  6    Entrance Stairs-Rails  Right;Left;Can reach both    [place]  Two level;Full bath on main level;1/2 bath on main level;Able to live on main level with bedroom/bathroom    Alternate Level Stairs-Number of Steps  20    Alternate Level Stairs-Rails  Right    Home Equipment  Walker - 2 wheels;Tub bench;Grab bars - tub/shower;Wheelchair - manual      Prior Function   Level of Independence  Independent;Independent with household mobility without device;Independent with community mobility without device    Vocation  On disability    Vocation Requirements  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 Climb stairs, minor lifting    Leisure  hunting, fishing, watching sports      Posture/Postural Control   Posture/Postural Control  Postural limitations    Postural Limitations  Rounded Shoulders;Forward head;Flexed trunk;Weight shift left      ROM / Strength   AROM / PROM / Strength  AROM;Strength      AROM   Overall AROM   Within functional limits for tasks performed      Strength   Overall Strength  Deficits    Overall Strength Comments   Hip extension & abduction appears functionally 3/5-4/5      Transfers   Transfers  Sit to Stand;Stand to Sit    Sit to Stand  5: Supervision;With upper extremity assist;With armrests;From chair/3-in-1   to RW   Stand to Sit  5: Supervision;With upper extremity assist;With armrests;To chair/3-in-1   from RW     Ambulation/Gait   Ambulation/Gait  Yes    Ambulation/Gait Assistance  5: Supervision;4: Min assist   supervision  RW & minA cane   Ambulation/Gait Assistance Details  partial weight bearing on prosthesis & excessive UE weight bearing    Ambulation Distance (Feet)  100 Feet    Assistive device  Prosthesis;Rolling walker;Straight cane   straight cane with quad tip   Gait Pattern  Step-to pattern;Decreased arm swing - right;Decreased step length - left;Decreased stance time - right;Decreased stride length;Decreased hip/knee flexion - right;Decreased weight shift to right;Right circumduction;Right hip hike;Right flexed knee in stance;Antalgic;Trunk flexed;Abducted- right;Poor foot clearance - right    Ambulation Surface  Indoor;Level    Gait velocity  1.74 ft/sec with cane & 2.63 ft/sec with RW      Standardized Balance Assessment   Standardized Balance Assessment  Berg Balance Test      Berg Balance Test   Sit to Stand  Able to stand  independently using hands    Standing Unsupported  Able to stand 2 minutes with supervision    Sitting with Back Unsupported but Feet Supported on Floor or Stool  Able to sit safely and securely 2 minutes    Stand to Sit  Controls descent by using hands    Transfers  Able to transfer safely, definite need of hands    Standing Unsupported with Eyes Closed  Able to stand 10 seconds with supervision    Standing Ubsupported with Feet Together  Able to place feet together independently and stand for 1 minute with supervision    From Standing, Reach Forward with Outstretched Arm  Can reach forward >12 cm safely (5")    From Standing Position, Pick up Object  from Floor  Able to pick up shoe, needs supervision    From Standing Position, Turn to Look Behind Over each Shoulder  Looks behind one side only/other side shows less weight shift    Turn 360 Degrees  Needs close supervision or verbal cueing    Standing Unsupported, Alternately Place Feet on Step/Stool  Able to complete >2 steps/needs minimal assist    Standing Unsupported, One Foot in Front  Able to take small step independently and hold 30 seconds    Standing on One Leg  Able to lift leg independently and hold 5-10 seconds    Total Score  38      Functional Gait  Assessment   Gait assessed   Yes    Gait Level Surface  Walks 20 ft, slow speed, abnormal gait pattern, evidence for imbalance or deviates 10-15 in outside of the 12 in walkway width. Requires more than 7 sec to ambulate 20 ft.    Change in Gait Speed  Makes only minor adjustments to walking speed, or accomplishes a change in speed with significant gait deviations, deviates 10-15 in outside the 12 in walkway width, or changes speed but loses balance but is able to recover and continue walking.    Gait with Horizontal Head Turns  Performs head turns with moderate changes in gait velocity, slows down, deviates 10-15 in outside 12 in walkway width but recovers, can continue to walk.    Gait with Vertical Head Turns  Performs task with moderate change in gait velocity, slows down, deviates 10-15 in outside 12 in walkway width but recovers, can continue to walk.    Gait and Pivot Turn  Turns slowly, requires verbal cueing, or requires several small steps to catch balance following turn and stop    Step Over Obstacle  Is able to step over one shoe box (4.5 in total height) but must slow down  and adjust steps to clear box safely. May require verbal cueing.    Gait with Narrow Base of Support  Ambulates less than 4 steps heel to toe or cannot perform without assistance.    Gait with Eyes Closed  Cannot walk 20 ft without assistance, severe gait  deviations or imbalance, deviates greater than 15 in outside 12 in walkway width or will not attempt task.    Ambulating Backwards  Cannot walk 20 ft without assistance, severe gait deviations or imbalance, deviates greater than 15 in outside 12 in walkway width or will not attempt task.    Steps  Two feet to a stair, must use rail.    Total Score  7    FGA comment:  Initial FGA with cane      Prosthetics Assessment - 10/20/17 1615      Prosthetics   Prosthetic Care Dependent with  Skin check;Residual limb care;Care of non-amputated limb;Prosthetic cleaning;Ply sock cleaning;Correct ply sock adjustment;Proper wear schedule/adjustment;Proper weight-bearing schedule/adjustment    Donning prosthesis   Supervision    Current prosthetic wear tolerance (days/week)   reports 19 of 19 days since delivery    Current prosthetic wear tolerance (#hours/day)   45 minutes 1-2 times per day    Current prosthetic weight-bearing tolerance (hours/day)   Patient tolerated standing for 5 minutes with partial weight on prosthesis with no c/o pain or discomfort    Edema  pitting edema    Residual limb condition   2 small opening that appear to be internal sutures working out, shiny darkened distal limb, minimal hair growth, bulbous to cylinderical shape    K code/activity level with prosthetic use   K3 full community with variable cadence               Objective measurements completed on examination: See above findings.      [facility] Adult PT Treatment/Exercise - 10/20/17 1615      Prosthetics   Prosthetic Care Comments   initiate wear 2 hrs 2x/day with increase q 5 days.    Education Provided  Skin check;Residual limb care;Prosthetic cleaning;Ply sock cleaning;Correct ply sock adjustment;Proper Donning;Proper Doffing;Proper wear schedule/adjustment;Proper weight-bearing schedule/adjustment    Person(s) Educated  Patient    Education Method  Explanation;Demonstration;Tactile cues;Verbal cues     Education Method  Verbalized understanding;Tactile cues required;Returned demonstration;Verbal cues required;Needs further instruction               PT Short Term Goals - 10/21/17 2300      PT SHORT TERM GOAL #1   Title  Patient tolerates prosthesis wear >12hrs total /day without skin issues. (All STGs Target Date: 11/19/2017)    Time  1    Period  Months    Status  New    Target Date  11/19/17      PT SHORT TERM GOAL #2   Title  Patient verbalizes proper adjusting ply socks with limb volume changes.     Time  1    Period  Months    Status  New    Target Date  11/19/17      PT SHORT TERM GOAL #3   Title  Patient ambulates 300' with cane & prosthesis with supervision.     Time  1    Period  Months    Status  New    Target Date  11/19/17      PT SHORT TERM GOAL #4   Title  Patient negotiates ramps, curbs & stairs single rail  with cane with supervision.     Time  1    Period  Months    Status  New    Target Date  11/19/17      PT SHORT TERM GOAL #5   Title  Patient is able to pick up 5# from floor with supervision.     Time  1    Period  Months    Status  New    Target Date  11/19/17        PT Long Term Goals - 10/21/17 2252      PT LONG TERM GOAL #1   Title  Patient verbalizes & demonstrates understanding of prosthetic care to enable safe use of prosthesis. (All LTGs Target Date: 01/16/2018)    Time  3    Period  Months    Status  New    Target Date  01/16/18      PT LONG TERM GOAL #2   Title  Patient tolerates prosthesis wear >90% of awake hours without skin issues to enable function throughout the day.     Time  3    Period  Months    Status  New    Target Date  01/16/18      PT LONG TERM GOAL #3   Title  Berg Balance >/= 48/56 to indicate lower fall risk.     Time  3    Period  Months    Status  New    Target Date  01/16/18      PT LONG TERM GOAL #4   Title  Functional Gait Assessment >19/30 with prosthesis only to indicate lower fall risk.      Time  3    Period  Months    Status  New    Target Date  01/16/18      PT LONG TERM GOAL #5   Title  Patient ambulates >500' with prosthesis only outdoors including grass, ramps, curbs & stairs modified independent.     Time  3    Period  Months    Status  New    Target Date  01/16/18             Plan - 10/21/17 2250    Clinical Impression Statement  This 47yo male underwent a right Transtibial Amputation on 06/06/2017 and received his first prosthesis on 10/01/2017. He is dependent in proper prosthetic care which increases risk of skin issues & limb pain. He has worn prosthesis 19 of 19 days but for only 45 minutes 1-2x/day. Limited wear of prosthesis limits function & increases fall risk. Patient has history of falls. Patient has high fall risk as noted by [place] 38/56. Patient ambulated with rolling walker & prosthesis with partial weight on prosthesis with gait deviations with gait velocity 2.63 ft/sec. PT assessed prosthetic gait with cane quad tip with gait velocity 1.74 ft/sec with minimal assist with gait deviations limiting weight on prosthesis. Functional Gait Assessment with cane 7/30 indicates high fall risk. Patient would benefit from skilled PT care to improve function & safety with his Transtibial prosthesis.     History and Personal Factors relevant to plan of care:  Patient lives with wife & 2 teenage children, R TTA, ESRD now home dialysis at night, MI, CAD, DM, HTN, CHF, PVD,     Clinical Presentation  Evolving    Clinical Presentation due to:  prosthetic dependency with risk of skin issues with DM, recent cardiac arrest, high fall risk  Rehab Potential  Good    PT Frequency  2x / week    PT Duration  Other (comment)   13 weeks (90 days)   PT Treatment/Interventions  ADLs/Self Care Home Management;[place] Repostioning;DME Instruction;Gait training;Stair training;Functional mobility training;Therapeutic activities;Therapeutic exercise;Balance  training;Neuromuscular re-education;Patient/family education;Prosthetic Training;Vestibular    PT Next Visit Plan  review prosthetic care, HEP at sink, prosthetic gait with RW & instruct in stairs, ramps & curbs    Consulted and Agree with Plan of Care  Patient       Patient will benefit from skilled therapeutic intervention in order to improve the following deficits and impairments:  Abnormal gait, Decreased activity tolerance, Decreased balance, Decreased endurance, Decreased mobility, Decreased skin integrity, Decreased strength, Dizziness, Impaired flexibility, Postural dysfunction, Prosthetic Dependency  Visit Diagnosis: Unsteadiness on feet - Plan: PT plan of care cert/re-cert  Other abnormalities of gait and mobility - Plan: PT plan of care cert/re-cert  Abnormal posture - Plan: PT plan of care cert/re-cert  Muscle weakness (generalized) - Plan: PT plan of care cert/re-cert     Problem List Patient Active Problem List   Diagnosis Date Noted  . Cardiac arrest ([place]) 09/24/2017  . Acute respiratory failure with hypoxia ([place]) 08/07/2017  . Respiratory distress 08/07/2017  . Dehiscence of amputation stump ([place]) 07/17/2017  . Benign essential HTN   . Chronic diastolic (congestive) heart failure ([place])   . Labile blood glucose   . Labile blood pressure   . Vomiting   . Phantom limb pain ([place])   . Poorly controlled type 2 diabetes mellitus with peripheral neuropathy ([place])   . Unilateral complete BKA, right, sequela ([place])   . Amputation of right lower extremity below knee ([place]) 06/10/2017  . ESRD on dialysis ([place])   . Chronic diastolic congestive heart failure ([place])   . History of PSVT (paroxysmal supraventricular tachycardia)   . Diabetes mellitus type 2 in nonobese (HCC)   . Post-operative pain   . Acute blood loss anemia   . Anemia of chronic disease   . Leukocytosis   . Tachycardia   . History of right below knee amputation ([place]) 06/06/2017  . PVD (peripheral vascular  disease) ([place]) 05/13/2017  . Bilateral pleural effusion 11/28/2015  . Loculated pleural effusion 11/28/2015  . HCAP (healthcare-associated pneumonia) 11/28/2015  . ESRD (end stage renal disease) on dialysis ([place]) 11/12/2015  . Hyperkalemia 11/12/2015  . Shoulder pain, acute 11/12/2015  . Nausea & vomiting 11/12/2015  . Diarrhea 11/12/2015  . Hypertensive urgency 05/05/2014  . Nephrotic syndrome 12/29/2013  . Hyponatremia 12/28/2013  . Acute diastolic heart failure ([place]) 12/28/2013  . Hypoglycemia 12/28/2013  . Dyspnea   . Acute renal failure syndrome ([place])   . Diabetic ketoacidosis without coma associated with type 1 diabetes mellitus ([place])   . Demand ischemia ([place]) 12/24/2013  . DKA (diabetic ketoacidoses) ([place]) 12/23/2013  . CAP (community acquired pneumonia) 12/23/2013  . Sepsis due to pneumonia ([place]) 12/23/2013  . Metabolic acidemia 24/23/5361  . Acute renal failure ([place]) 12/23/2013  . Diabetic ketoacidosis ([place]) 12/23/2013  . Chronic diastolic heart failure ([place]) 04/14/2013  . PSVT (paroxysmal supraventricular tachycardia) ([place]) 04/14/2013  . Bilateral leg edema 03/26/2013  . Chronic kidney disease, stage III (moderate) ([place]) 09/29/2011  . Chest pain 09/26/2010  . Type 1 diabetes mellitus with end-stage renal disease ([place]) 09/26/2010  . Mixed hyperlipidemia 09/26/2010    Mya Suell PT, DPT 10/21/2017, 11:07 PM  [facility] [address] [place], [place], [address]  Phone: [phone]   Fax:  [phone]  Name: Lance [name] MRN: [id number] Date of Birth: 12/17/1970

## 2017-10-22 ENCOUNTER — Encounter: Payer: Self-pay | Admitting: Physical Therapy

## 2017-10-22 ENCOUNTER — Ambulatory Visit: Payer: Medicare Other | Admitting: Physical Therapy

## 2017-10-22 DIAGNOSIS — M6281 Muscle weakness (generalized): Secondary | ICD-10-CM | POA: Diagnosis not present

## 2017-10-22 DIAGNOSIS — R293 Abnormal posture: Secondary | ICD-10-CM

## 2017-10-22 DIAGNOSIS — R2689 Other abnormalities of gait and mobility: Secondary | ICD-10-CM

## 2017-10-22 DIAGNOSIS — R2681 Unsteadiness on feet: Secondary | ICD-10-CM | POA: Diagnosis not present

## 2017-10-22 IMAGING — CR DG CHEST 2V
2 series · 2 of 2 positions shown · non-contrast
Comparison: 12/01/2015

CLINICAL DATA: Evaluate chest tube placement

EXAM:
CHEST  2 VIEW

[chest pa]
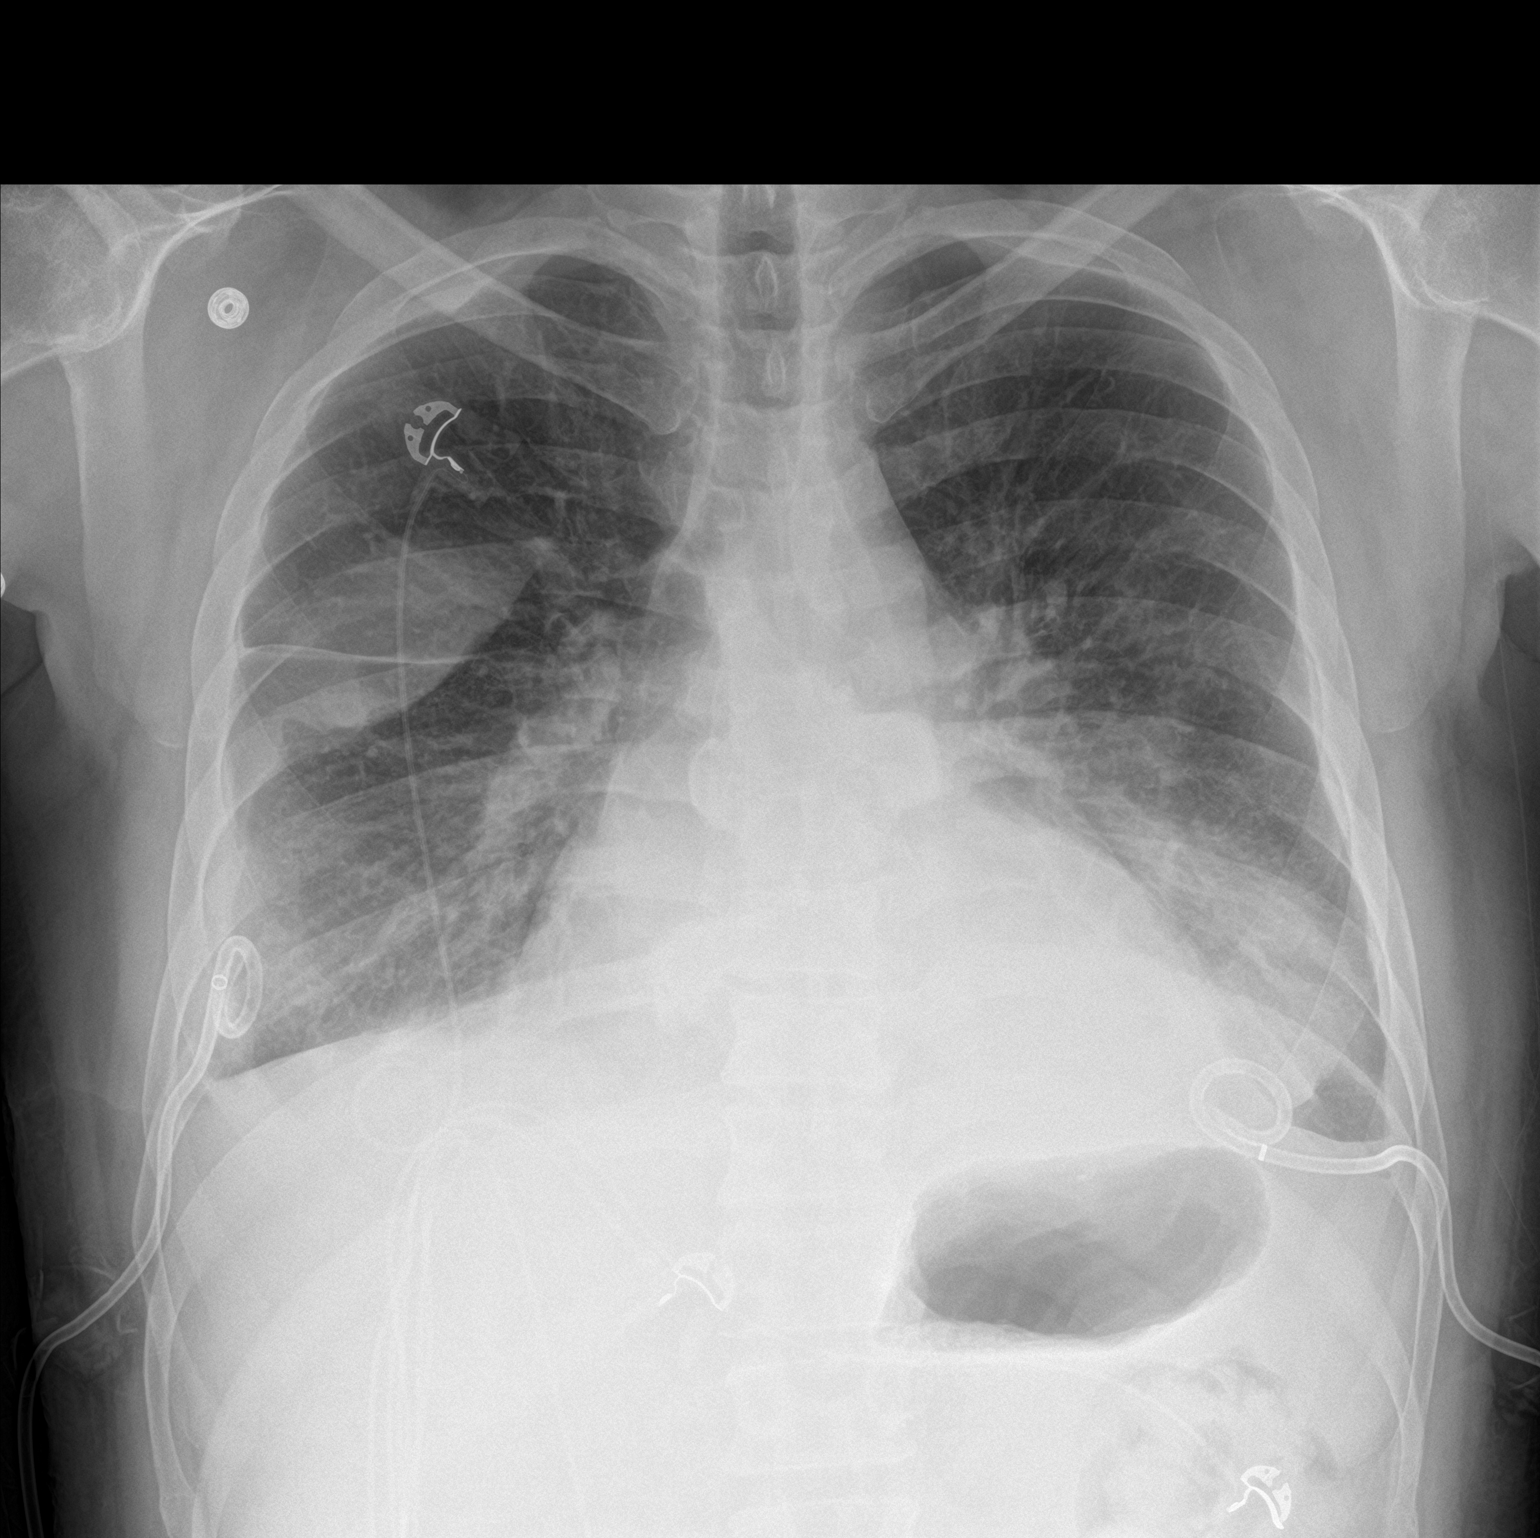

[chest lat]
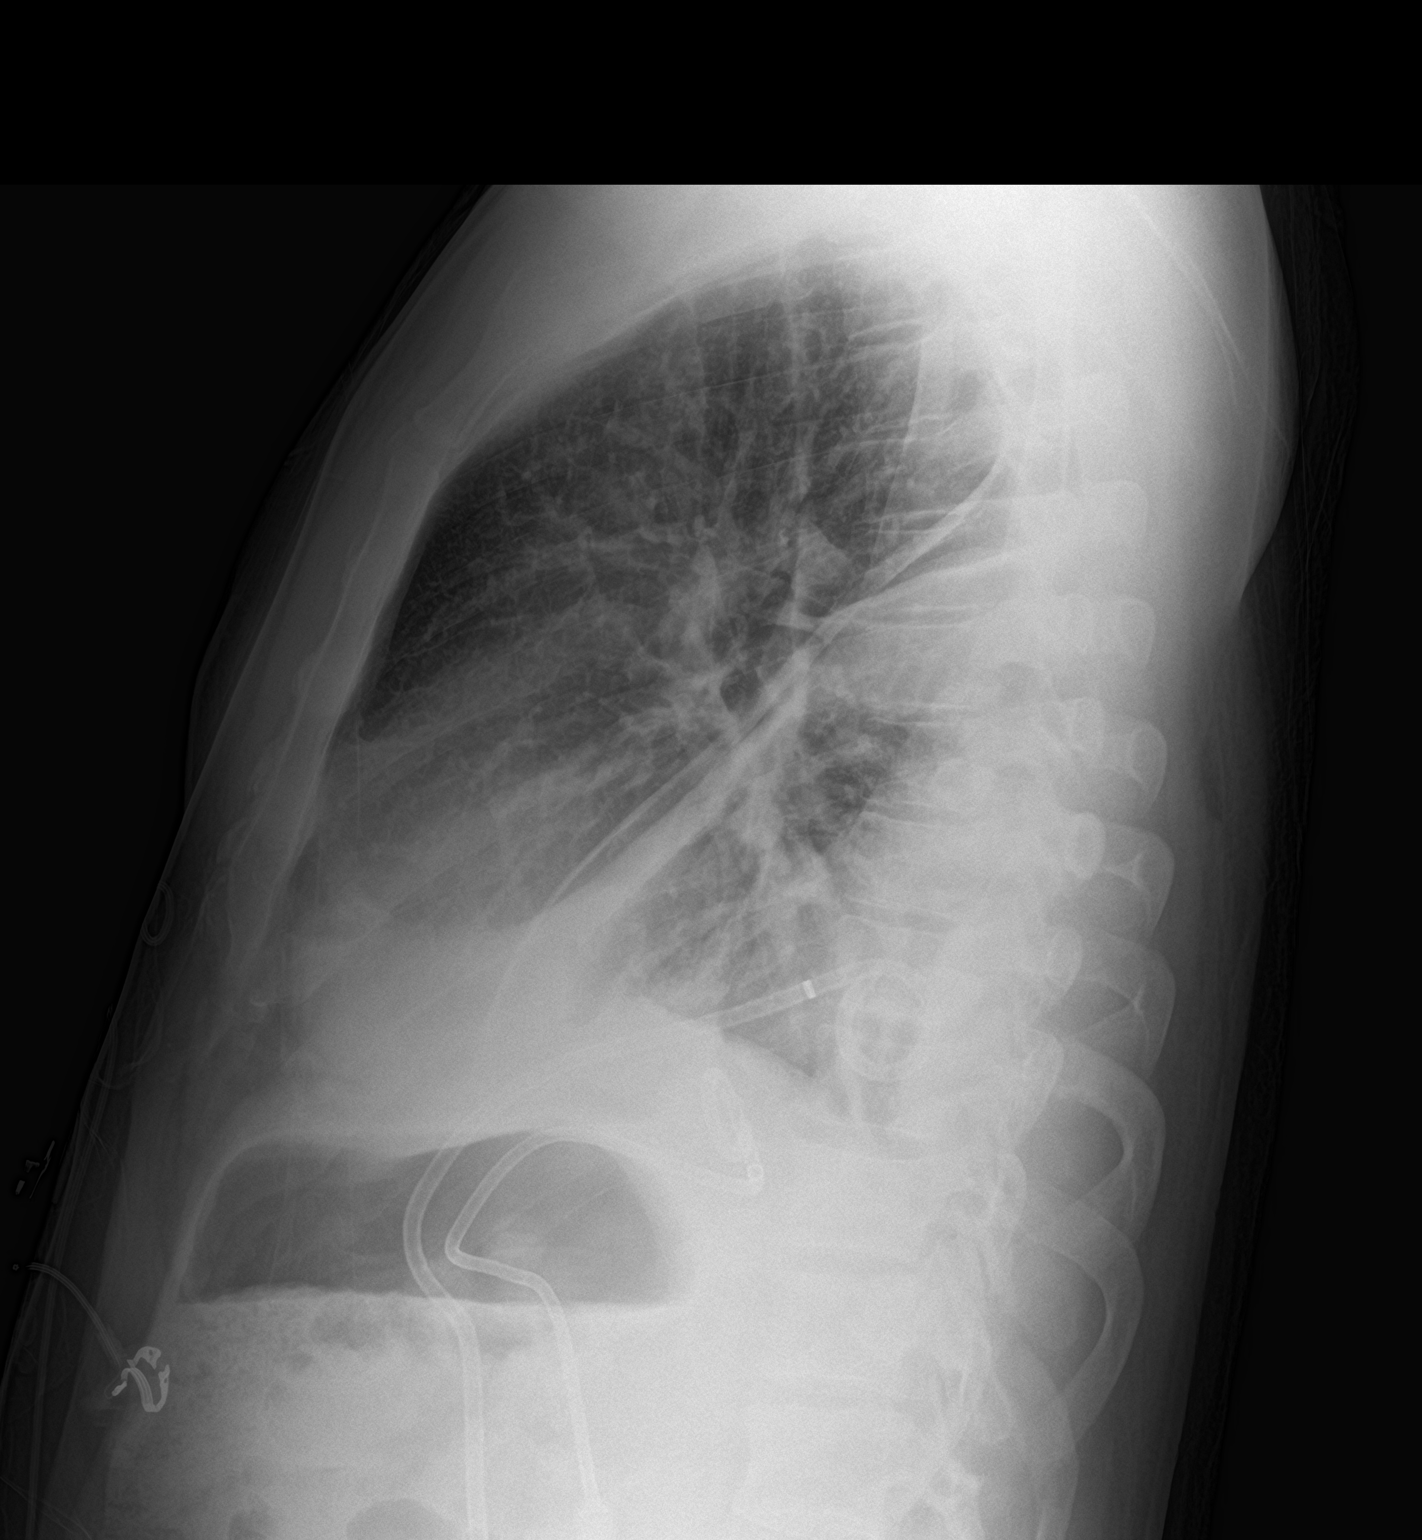

[2 of 2 positions shown; findings below may reference images not displayed]

FINDINGS: Bilateral chest tubes are again identified and stable. There is
persistent fluid within the fissure on the right causing
pseudotumor. Bibasilar atelectatic changes are noted.
IMPRESSION: Bibasilar pleural drainage catheters. Persistent fluid is noted
within the major fissure on the right. Bibasilar changes remain and
are stable from the prior exam. No pneumothorax is noted.

## 2017-10-22 NOTE — Patient Instructions (Signed)

## 2017-10-23 ENCOUNTER — Encounter: Payer: Self-pay | Admitting: "Endocrinology

## 2017-10-23 ENCOUNTER — Ambulatory Visit (INDEPENDENT_AMBULATORY_CARE_PROVIDER_SITE_OTHER): Payer: BLUE CROSS/BLUE SHIELD | Admitting: "Endocrinology

## 2017-10-23 VITALS — BP 143/83 | HR 76 | Ht 75.0 in | Wt 221.0 lb

## 2017-10-23 DIAGNOSIS — D631 Anemia in chronic kidney disease: Secondary | ICD-10-CM | POA: Diagnosis not present

## 2017-10-23 DIAGNOSIS — D509 Iron deficiency anemia, unspecified: Secondary | ICD-10-CM | POA: Diagnosis not present

## 2017-10-23 DIAGNOSIS — E1010 Type 1 diabetes mellitus with ketoacidosis without coma: Secondary | ICD-10-CM | POA: Insufficient documentation

## 2017-10-23 DIAGNOSIS — I10 Essential (primary) hypertension: Secondary | ICD-10-CM

## 2017-10-23 DIAGNOSIS — N186 End stage renal disease: Secondary | ICD-10-CM

## 2017-10-23 DIAGNOSIS — Z79899 Other long term (current) drug therapy: Secondary | ICD-10-CM | POA: Diagnosis not present

## 2017-10-23 DIAGNOSIS — E782 Mixed hyperlipidemia: Secondary | ICD-10-CM

## 2017-10-23 DIAGNOSIS — I70243 Atherosclerosis of native arteries of left leg with ulceration of ankle: Secondary | ICD-10-CM

## 2017-10-23 DIAGNOSIS — E1022 Type 1 diabetes mellitus with diabetic chronic kidney disease: Secondary | ICD-10-CM | POA: Diagnosis not present

## 2017-10-23 DIAGNOSIS — E440 Moderate protein-calorie malnutrition: Secondary | ICD-10-CM | POA: Diagnosis not present

## 2017-10-23 DIAGNOSIS — R509 Fever, unspecified: Secondary | ICD-10-CM | POA: Diagnosis not present

## 2017-10-23 DIAGNOSIS — E1059 Type 1 diabetes mellitus with other circulatory complications: Secondary | ICD-10-CM | POA: Diagnosis not present

## 2017-10-23 IMAGING — CR DG CHEST 1V PORT
1 series · 1 of 1 positions shown · non-contrast
Comparison: 12/02/2015

CLINICAL DATA: Bilateral pleural effusion

EXAM:
PORTABLE CHEST 1 VIEW

[AP]
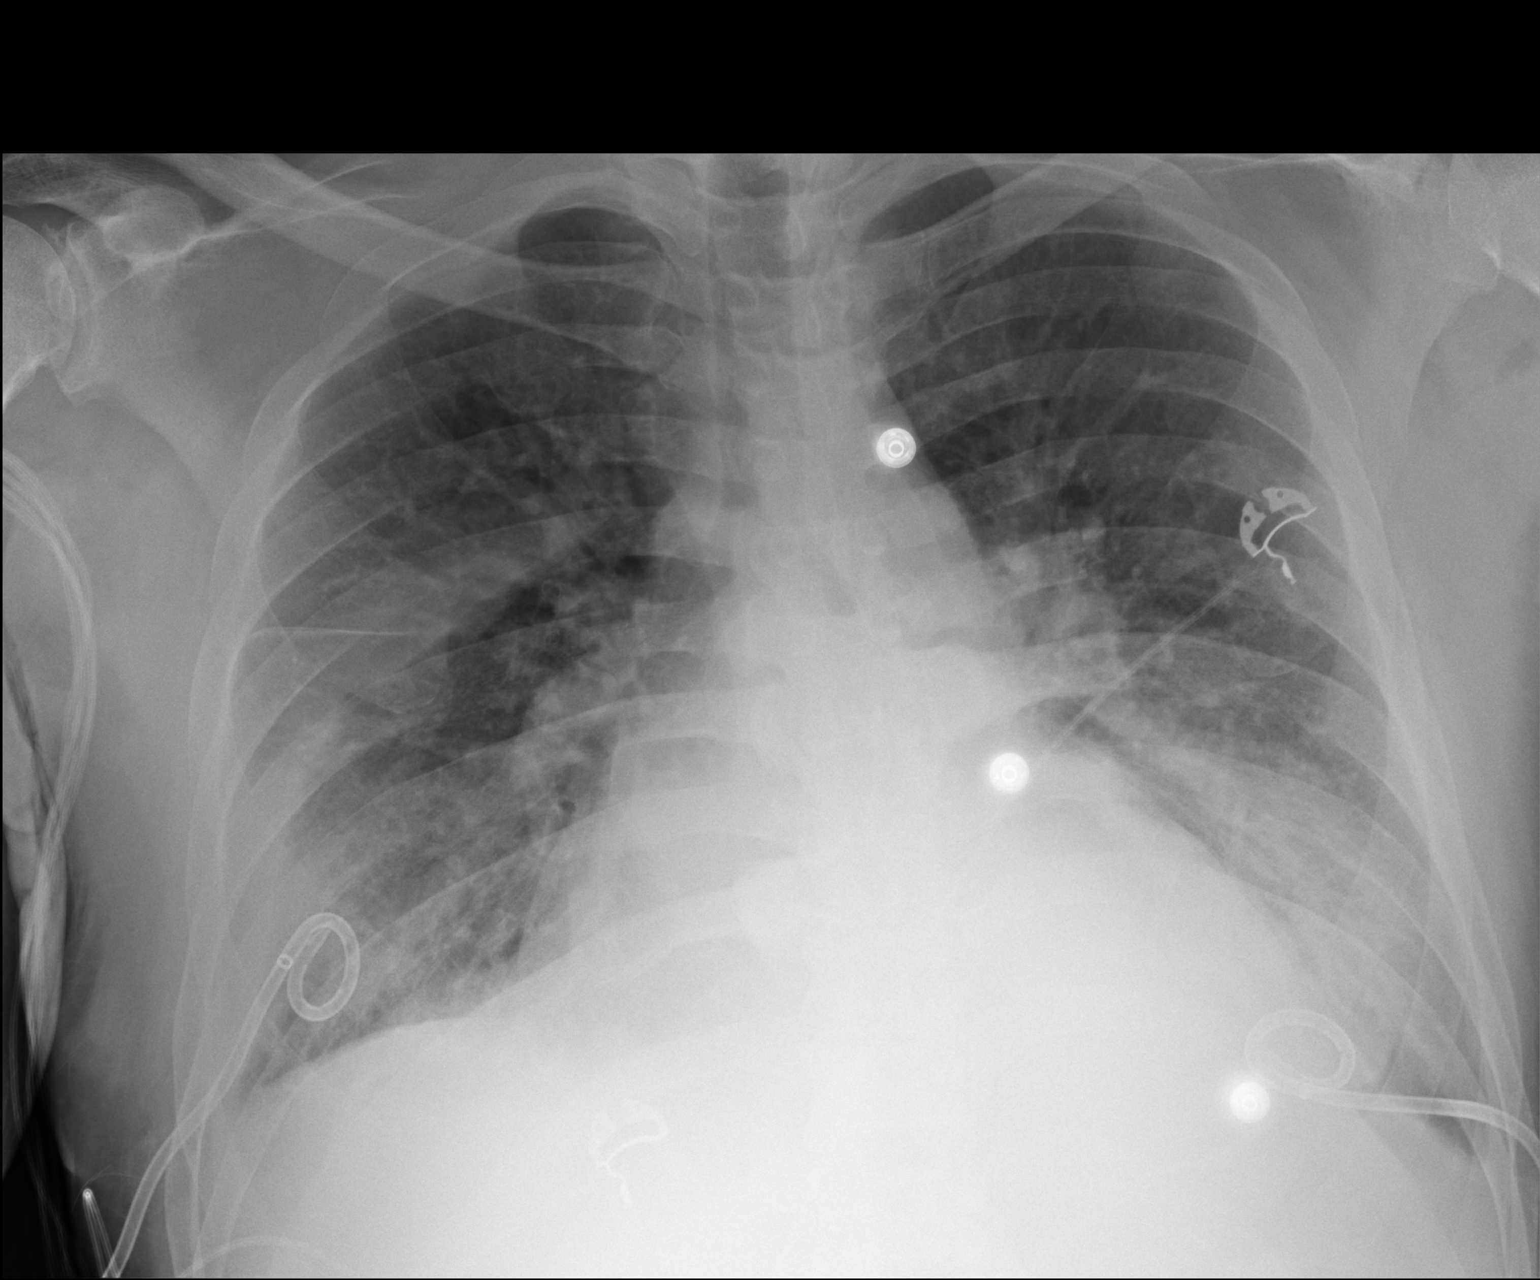

[1 of 1 positions shown; findings below may reference images not displayed]

FINDINGS: Cardiomediastinal silhouette is stable. Central mild vascular
congestion without convincing pulmonary edema. Hazy bilateral
basilar atelectasis or infiltrate. Trace bilateral pleural effusion.
Persistent fluid in right major fissure.
IMPRESSION: Central mild vascular congestion without convincing pulmonary edema.
Hazy bilateral basilar atelectasis or infiltrate. Trace bilateral
pleural effusion. Persistent fluid in right major fissure.

## 2017-10-23 MED ORDER — INSULIN GLARGINE 100 UNITS/ML SOLOSTAR PEN: 30.0000 [IU] | PEN_INJECTOR | Freq: Every day | SUBCUTANEOUS | 11 refills | Status: DC

## 2017-10-23 MED ORDER — INSULIN LISPRO 100 UNIT/ML (KWIKPEN): 15.0000 [IU] | PEN_INJECTOR | SUBCUTANEOUS | 0 refills | Status: DC

## 2017-10-23 MED ORDER — INSULIN LISPRO 100 UNIT/ML (KWIKPEN): 10.0000 [IU] | PEN_INJECTOR | SUBCUTANEOUS | 0 refills | Status: DC

## 2017-10-23 NOTE — Progress Notes (Signed)
Endocrinology follow-up note       10/23/2017, 1:20 PM   Subjective:    Patient ID: Lance [name], male    DOB: [birth year].  Lance [name] is being seen in follow-up  for management of currently uncontrolled.  Symptomatic type 1 diabetes, hyperlipidemia, hypertension. PMD:   [name], MD.   Past Medical History:  Diagnosis Date  . Aortic stenosis    Very mild in 05/2007  . Arthritis    "right shoulder; right knee; feel like I've got it all over" (11/28/2015)  . CHF (congestive heart failure) ([place])   . Degenerative joint disease    Left TKA  . Diastolic heart failure (HCC)    LVEF 65-70% with grade 2 diastolic dysfunction  . ESRD (end stage renal disease) on dialysis [facility])    (as of 09/24/2017) pt does HD at home on a M/Tu/Th/F schedule.  . Essential hypertension, benign 2000   LVH  . GERD (gastroesophageal reflux disease)   . HCAP (healthcare-associated pneumonia) 11/28/2015  . Hyperlipidemia 2000  . Loculated pleural effusion 11/28/2015   [name] 11/28/2015  . Pneumonia 12/2013; 05/2014  . PSVT (paroxysmal supraventricular tachycardia) (HCC)    Nonsustained; asymptomatic; diagnosed by event recorder in 2006  . Tobacco abuse, in remission    20 pack years; discontinued in 1985; bronchitic changes on chest x-ray and 2009  . Type 1 diabetes mellitus ([place]) 1990   Past Surgical History:  Procedure Laterality Date  . AMPUTATION Right 06/06/2017   Procedure: RIGHT BELOW KNEE AMPUTATION;  Surgeon: [name], MD;  Location: [place];  Service: Orthopedics;  Laterality: Right;  . APPLICATION OF WOUND VAC Right 07/25/2017   Procedure: APPLICATION OF WOUND VAC;  Surgeon: [name], MD;  Location: [place];  Service: Orthopedics;  Laterality: Right;  . AV FISTULA PLACEMENT Right 06/23/2014   Procedure: ARTERIOVENOUS (AV) FISTULA CREATION;  Surgeon: Angelia Mould, MD;  Location:  [place];  Service: Vascular;  Laterality: Right;  . CARDIAC CATHETERIZATION     [facility]- evaluation for Kidney/ pancreas transplant  . EYE SURGERY Bilateral    "for glaucoma"  . INGUINAL HERNIA REPAIR Right    As a child  . IR DIALY SHUNT INTRO NEEDLE/INTRACATH INITIAL W/IMG RIGHT Right 09/29/2017  . LOWER EXTREMITY ANGIOGRAPHY N/A 05/27/2017   Procedure: LOWER EXTREMITY ANGIOGRAPHY;  Surgeon: [name], MD;  Location: [place] [facility];  Service: Cardiovascular;  Laterality: N/A;  . STUMP REVISION Right 07/25/2017   Procedure: REVISION RIGHT BELOW KNEE AMPUTATION;;  Surgeon: [name], MD;  Location: [place];  Service: Orthopedics;  Laterality: Right;  . WISDOM TOOTH EXTRACTION  1987   Social History   Socioeconomic History  . Marital status: Married    Spouse name: Not on file  . Number of children: Not on file  . Years of education: Not on file  . Highest education level: Not on file  Occupational History  . Not on file  Social Needs  . Financial resource strain: Not on file  . Food insecurity:    Worry: Not on file    Inability: Not  on file  . Transportation needs:    Medical: Not on file    Non-medical: Not on file  Tobacco Use  . Smoking status: Never Smoker  . Smokeless tobacco: Never Used  Substance and Sexual Activity  . Alcohol use: Yes    Comment: 11/28/2015 "used to drink; stopped ~ 2 yr ago"  . Drug use: No  . Sexual activity: Yes  Lifestyle  . Physical activity:    Days per week: Not on file    Minutes per session: Not on file  . Stress: Not on file  Relationships  . Social connections:    Talks on phone: Not on file    Gets together: Not on file    Attends religious service: Not on file    Active member of club or organization: Not on file    Attends meetings of clubs or organizations: Not on file    Relationship status: Not on file  Other Topics Concern  . Not on file  Social History Narrative  . Not on file   Outpatient Encounter  Medications as of 10/23/2017  Medication Sig  . acetaminophen (TYLENOL) 500 MG tablet Take 1,000 mg by mouth every 6 (six) hours as needed (for pain).  Marland Kitchen aspirin EC 325 MG EC tablet Take 1 tablet (325 mg total) by mouth daily.  . calcitRIOL (ROCALTROL) 0.25 MCG capsule Take 1 capsule (0.25 mcg total) by mouth every Monday, Wednesday, and Friday with hemodialysis. (Patient taking differently: Take 0.25 mcg by mouth See admin instructions. Take 0.25 mcg by mouth once a day on Mon/Tues/Thurs/Fri with home dialysis)  . carvedilol (COREG) 3.125 MG tablet Take 1 tablet (3.125 mg total) by mouth 2 (two) times daily with a meal. (Patient taking differently: Take 6.25 mg by mouth 2 (two) times daily with a meal. )  . cloNIDine (CATAPRES) 0.1 MG tablet Take 0.1 mg by mouth daily.   . Continuous Blood Gluc Sensor (FREESTYLE LIBRE 14 DAY SENSOR) MISC 1 each by Does not apply route every 14 (fourteen) days.  Marland Kitchen docusate sodium (COLACE) 100 MG capsule Take 1 capsule (100 mg total) by mouth 2 (two) times daily. (Patient taking differently: Take 200 mg by mouth daily as needed (when taking pain meds). )  . insulin glargine (LANTUS) 100 unit/mL SOPN Inject 0.3 mLs (30 Units total) into the skin at bedtime.  . insulin lispro (HUMALOG KWIKPEN) 100 UNIT/ML KiwkPen Inject 0.15-0.21 mLs (15-21 Units total) into the skin See admin instructions. Sliding scale three times daily  . lisinopril (PRINIVIL,ZESTRIL) 40 MG tablet Take 40 mg by mouth daily.   . ondansetron (ZOFRAN ODT) 4 MG disintegrating tablet Take 1 tablet (4 mg total) by mouth every 8 (eight) hours as needed for nausea or vomiting.  . pantoprazole (PROTONIX) 40 MG tablet TAKE 1 TABLET BY MOUTH EVERY DAY (Patient taking differently: Take 40 mg by mouth daily. )  . polyethylene glycol (MIRALAX) packet Take 17 g by mouth daily. (Patient taking differently: Take 17 g by mouth See admin instructions. Take 17 grams by mouth, mixed, once a day only when taking pain meds)   . psyllium (HYDROCIL/METAMUCIL) 95 % PACK Take 1 packet by mouth daily. (Patient not taking: Reported on 10/20/2017)  . sucroferric oxyhydroxide (VELPHORO) 500 MG chewable tablet Chew 3 tablets (1,500 mg total) by mouth 3 (three) times daily.  . [DISCONTINUED] Continuous Blood Gluc Receiver (FREESTYLE LIBRE 14 DAY READER) DEVI 1 each by Does not apply route every 14 (fourteen) days.  . [  DISCONTINUED] insulin glargine (LANTUS) 100 unit/mL SOPN Inject 0.25 mLs (25 Units total) into the skin at bedtime.  . [DISCONTINUED] insulin lispro (HUMALOG KWIKPEN) 100 UNIT/ML KiwkPen Inject 0.15-0.21 mLs (15-21 Units total) into the skin See admin instructions. Sliding scale three times daily  . [DISCONTINUED] insulin lispro (HUMALOG KWIKPEN) 100 UNIT/ML KiwkPen Inject 0.1-0.16 mLs (10-16 Units total) into the skin See admin instructions. Sliding scale three times daily   No facility-administered encounter medications on file as of 10/23/2017.     ALLERGIES: Allergies  Allergen Reactions  . Atorvastatin Other (See Comments)    Muscle pain   . Minoxidil Other (See Comments)    Fluid retention   . Adhesive [Tape] Other (See Comments)    MUST USE "SILK TAPE," AS ANY OTHER "BURNS" THE SKIN  . Statins Other (See Comments)    Muscle pain    VACCINATION STATUS: Immunization History  Administered Date(s) Administered  . Influenza,inj,Quad PF,6+ Mos 05/06/2014  . Pneumococcal Polysaccharide-23 05/06/2014    Diabetes  He presents for his follow-up diabetic visit. He has type 1 diabetes mellitus. Onset time: He was diagnosed at approximate age of 42 years with type 1 diabetes. His disease course has been worsening (Since his last visit, he was hospitalized for cardiac arrest which reportedly happened because of hyperkalemia from defective dialysis machine.). There are no hypoglycemic associated symptoms. Pertinent negatives for hypoglycemia include no confusion, headaches, pallor or seizures. Pertinent  negatives for diabetes include no chest pain, no fatigue, no polydipsia, no polyphagia, no polyuria and no weakness. There are no hypoglycemic complications. Symptoms are improving. Diabetic complications include nephropathy and PVD. (His diabetes is complicated by end-stage renal disease on  hemodialysis for the last 5 years.  Since last visit, he underwent right below-knee amputation due to peripheral arterial disease.  Patient is  on  Kidney transplant list.) Risk factors for coronary artery disease include diabetes mellitus, hypertension and sedentary lifestyle. Current diabetic treatment includes insulin injections. His weight is decreasing steadily (Status post right BKA.). He is following a generally unhealthy diet. He has had a previous visit with a dietitian. He never participates in exercise. His home blood glucose trend is decreasing steadily. His breakfast blood glucose range is generally 180-200 mg/dl. His lunch blood glucose range is generally 180-200 mg/dl. His dinner blood glucose range is generally 180-200 mg/dl. His bedtime blood glucose range is generally 180-200 mg/dl. His overall blood glucose range is 180-200 mg/dl. ( -His recent A1c has improved to 6.7% - 7%, significant improvement from his prior records of  9.8%, 9.7%, and 11.2% between 2015 - 2017.) An ACE inhibitor/angiotensin II receptor blocker is being taken. Eye exam is current.  Hypertension  This is a chronic problem. The current episode started more than 1 year ago. The problem is controlled. Pertinent negatives include no chest pain, headaches, neck pain, palpitations or shortness of breath. Risk factors for coronary artery disease include diabetes mellitus and sedentary lifestyle. Past treatments include ACE inhibitors. Hypertensive end-organ damage includes kidney disease and PVD.   Review of Systems  Constitutional: Negative for chills, fatigue, fever and unexpected weight change.  HENT: Negative for dental problem, mouth  sores and trouble swallowing.   Eyes: Negative for visual disturbance.  Respiratory: Negative for cough, choking, chest tightness, shortness of breath and wheezing.   Cardiovascular: Negative for chest pain, palpitations and leg swelling.  Gastrointestinal: Negative for abdominal distention, abdominal pain, constipation, diarrhea, nausea and vomiting.  Endocrine: Negative for polydipsia, polyphagia and polyuria.  Genitourinary:  Negative for dysuria, flank pain, hematuria and urgency.  Musculoskeletal: Negative for back pain, gait problem, myalgias and neck pain.  Skin: Negative for pallor, rash and wound.  Neurological: Negative for seizures, syncope, weakness, numbness and headaches.  Psychiatric/Behavioral: Negative for confusion and dysphoric mood.    Objective:    BP (!) 143/83   Pulse 76   Ht 6\' 3"  (1.905 m)   Wt 221 lb (100.2 kg)   BMI 27.62 kg/m   Wt Readings from Last 3 Encounters:  10/23/17 221 lb (100.2 kg)  10/08/17 210 lb (95.3 kg)  09/30/17 211 lb 3.2 oz (95.8 kg)     Physical Exam  Constitutional: He is oriented to person, place, and time. He appears well-developed. He is cooperative. No distress.  HENT:  Head: Normocephalic and atraumatic.  Eyes: EOM are normal.  Neck: Normal range of motion. Neck supple. No tracheal deviation present. No thyromegaly present.  Cardiovascular: Normal rate, S1 normal and S2 normal. Exam reveals no gallop.  No murmur heard. Pulses:      Dorsalis pedis pulses are 1+ on the right side, and 1+ on the left side.       Posterior tibial pulses are 1+ on the right side, and 1+ on the left side.  Pulmonary/Chest: Effort normal. No respiratory distress. He has no wheezes.  Abdominal: He exhibits no distension. There is no tenderness. There is no guarding and no CVA tenderness.  Musculoskeletal: He exhibits no edema.       Right shoulder: He exhibits no swelling and no deformity.  Patient on a wheelchair due to right below-knee amputation  from peripheral arterial disease as a complication of diabetes.   Neurological: He is alert and oriented to person, place, and time. He has normal strength. No cranial nerve deficit or sensory deficit. Gait normal.  Skin: Skin is warm and dry. No rash noted. No cyanosis. Nails show no clubbing.  Psychiatric: He has a normal mood and affect. His speech is normal and behavior is normal. Cognition and memory are normal.   CMP ( most recent) CMP     Component Value Date/Time   NA 133 (L) 09/30/2017 0510   NA 131 (L) 02/18/2017 1438   K 3.7 09/30/2017 0510   CL 97 (L) 09/30/2017 0510   CO2 27 09/30/2017 0510   GLUCOSE 170 (H) 09/30/2017 0510   BUN 40 (H) 09/30/2017 0510   BUN 46 (H) 02/18/2017 1438   CREATININE 9.38 (H) 09/30/2017 0510   CREATININE 2.21 (H) 03/26/2013 1520   CALCIUM 8.6 (L) 09/30/2017 0510   PROT 6.9 09/26/2017 0350   PROT 8.3 02/18/2017 1438   ALBUMIN 2.8 (L) 09/30/2017 0510   ALBUMIN 4.4 02/18/2017 1438   AST 39 09/26/2017 0350   ALT 43 09/26/2017 0350   ALKPHOS 94 09/26/2017 0350   BILITOT 0.8 09/26/2017 0350   BILITOT 0.5 02/18/2017 1438   GFRNONAA 6 (L) 09/30/2017 0510   GFRAA 7 (L) 09/30/2017 0510     Diabetic Labs (most recent): Lab Results  Component Value Date   HGBA1C 6.7 (H) 07/25/2017   HGBA1C 9.1 (H) 02/18/2017   HGBA1C 9.8 (H) 11/28/2015     Lipid Panel ( most recent) Lipid Panel     Component Value Date/Time   CHOL 174 12/28/2013 0707   TRIG 102 09/25/2017 0450   HDL 39 (L) 12/28/2013 0707   CHOLHDL 4.5 12/28/2013 0707   VLDL 46 (H) 12/28/2013 0707   LDLCALC 89 12/28/2013 0707  Lab Results  Component Value Date   TSH 1.390 02/18/2017   TSH 1.760 12/26/2013   FREET4 1.00 02/18/2017     Assessment & Plan:   1. Type 1 diabetes mellitus with end-stage renal disease ([place])  - Lance [name] has currently uncontrolled symptomatic type 1 DM since  47 years of age.  -He recently was hospitalized for cardiac arrest due to  hyperkalemia which resulted from defective dialysis machine.  Not too long ago is here,  he underwent right BKA due to peripheral arterial disease.   -He returns with improved glycemic profile, A1c improved to 6% - 7% range from 9.8% during his last visit.    -His most recent CGM analysis shows 40% time in range, 59% above range.    -his diabetes is complicated by end-stage renal disease on hemodialysis/kidney transplant list, PAD, sedentary life and Lance [name] remains at a high risk for more acute and chronic complications which include CAD, CVA, retinopathy, and neuropathy. These are all discussed in detail with the patient.  - I have counseled him on diet management  by adopting a carbohydrate restricted/protein rich diet.  -  Suggestion is made for him to avoid simple carbohydrates  from his diet including Cakes, Sweet Desserts / Pastries, Ice Cream, Soda (diet and regular), Sweet Tea, Candies, Chips, Cookies, Store Bought Juices, Alcohol in Excess of  1-2 drinks a day, Artificial Sweeteners, and "Sugar-free" Products. This will help patient to have stable blood glucose profile and potentially avoid unintended weight gain.  - I encouraged him to switch to  unprocessed or minimally processed complex starch and increased protein intake (animal or plant source), fruits, and vegetables.  - he is advised to stick to a routine mealtimes to eat 3 meals  a day and avoid unnecessary snacks ( to snack only to correct hypoglycemia).   - he has been scheduled with Jearld Fenton, RDN, CDE for individualized diabetes education- consult pending.  - I have approached him with the following individualized plan to manage diabetes and patient agrees:   - Given his chronic glycemic burden , he will continue to require intensive treatment with basal/bolus insulin in order for him to maintain control of diabetes to target. - Patient reports history of uncontrolled diabetes and hypertension as a cause of  ESRD.  -Based on his presentation with above target fasting glycemia, he will tolerate Lantus 30 units nightly.    -I advised him to continue Humalog 15 units 3 times a day with meals for pre-meal BG readings of 90-150mg /dl, plus patient specific correction dose for unexpected hyperglycemia above 150mg /dl, associated with strict documenting of  glucose 4 times a day-before meals and at bedtime. - Patient is warned not to take insulin without proper monitoring per orders. -Adjustment parameters are given for hypo and hyperglycemia in writing. -Patient is encouraged to call clinic for blood glucose levels less than 70 or above 300 mg /dl.  - Insulin is the only choice of therapy for him. He is benefiting from CGM, I advised him to wear it at all times.   - Patient specific target  A1c;  LDL, HDL, Triglycerides, and  Waist Circumference were discussed in detail.  2) BP/HTN: His blood pressure is not controlled to target.  He is advised to continue his current blood pressure medications including Coreg 3.125 mg p.o. twice daily, lisinopril 40 mg p.o. Daily.   3) Lipids/HPL: Recent  Lipid panel   shows LDL at 89. He  is marked as allergic to statins.   4)  Weight/Diet: CDE Consult has been  initiated , exercise, and detailed carbohydrates information provided.  5) Chronic Care/Health Maintenance:  -he  is on ACEI medications and  is encouraged to continue to follow up with Ophthalmology, Dentist, nephrology,  Podiatrist at least yearly or according to recommendations, and advised to  stay away from smoking. I have recommended yearly flu vaccine and pneumonia vaccination at least every 5 years; moderate intensity exercise for up to 150 minutes weekly; and  sleep for at least 7 hours a day.  - I advised patient to maintain close follow up with [name], MD for primary care needs.  - Time spent with the patient: 25 min, of which >50% was spent in reviewing his blood glucose logs ,  discussing his hypo- and hyper-glycemic episodes, reviewing his current and  previous labs and insulin doses and developing a plan to avoid hypo- and hyper-glycemia. Please refer to Patient Instructions for Blood Glucose Monitoring and Insulin/Medications Dosing Guide"  in media tab for additional information. Lance [name] participated in the discussions, expressed understanding, and voiced agreement with the above plans.  All questions were answered to his satisfaction. he is encouraged to contact clinic should he have any questions or concerns prior to his return visit.   Follow up plan: - Return in about 3 months (around 01/22/2018) for Meter, and Logs, Follow up with Pre-visit Labs, Meter, and Logs.  Glade Lloyd, MD [facility] [address] [place], [place] [address] Phone: [phone]  Fax: [phone]    10/23/2017, 1:20 PM  This note was partially dictated with voice recognition software. Similar sounding words can be transcribed inadequately or may not  be corrected upon review.

## 2017-10-23 NOTE — Therapy (Signed)
[facility] [address] [place], [place], [address] Phone: [phone]   Fax:  [phone]  Physical Therapy Treatment  Patient Details  Name: Lance [name] MRN: [id number] Date of Birth: 1971-01-22 Referring Provider: [name], MD   Encounter Date: 10/22/2017  PT End of Session - 10/22/17 0938    Visit Number  2    Number of Visits  26    Date for PT Re-Evaluation  01/16/18    Authorization Type  Medicare & BCBS     Authorization Time Period  $3100 oop max has been met.  VL PT & OT combined 60 visits with zero met at PT eval    PT Start Time  0935    PT Stop Time  1015    PT Time Calculation (min)  40 min    Equipment Utilized During Treatment  Gait belt    Activity Tolerance  Patient tolerated treatment well    Behavior During Therapy  WFL for tasks assessed/performed       Past Medical History:  Diagnosis Date  . Aortic stenosis    Very mild in 05/2007  . Arthritis    "right shoulder; right knee; feel like I've got it all over" (11/28/2015)  . CHF (congestive heart failure) ([place])   . Degenerative joint disease    Left TKA  . Diastolic heart failure (HCC)    LVEF 65-70% with grade 2 diastolic dysfunction  . ESRD (end stage renal disease) on dialysis [facility])    (as of 09/24/2017) pt does HD at home on a M/Tu/Th/F schedule.  . Essential hypertension, benign 2000   LVH  . GERD (gastroesophageal reflux disease)   . HCAP (healthcare-associated pneumonia) 11/28/2015  . Hyperlipidemia 2000  . Loculated pleural effusion 11/28/2015   [name] 11/28/2015  . Pneumonia 12/2013; 05/2014  . PSVT (paroxysmal supraventricular tachycardia) (HCC)    Nonsustained; asymptomatic; diagnosed by event recorder in 2006  . Tobacco abuse, in remission    20 pack years; discontinued in 1985; bronchitic changes on chest x-ray and 2009  . Type 1 diabetes mellitus ([place]) 1990    Past Surgical History:  Procedure Laterality  Date  . AMPUTATION Right 06/06/2017   Procedure: RIGHT BELOW KNEE AMPUTATION;  Surgeon: [name], MD;  Location: [facility];  Service: Orthopedics;  Laterality: Right;  . APPLICATION OF WOUND VAC Right 07/25/2017   Procedure: APPLICATION OF WOUND VAC;  Surgeon: [name], MD;  Location: [facility];  Service: Orthopedics;  Laterality: Right;  . AV FISTULA PLACEMENT Right 06/23/2014   Procedure: ARTERIOVENOUS (AV) FISTULA CREATION;  Surgeon: Angelia Mould, MD;  Location: [facility];  Service: Vascular;  Laterality: Right;  . CARDIAC CATHETERIZATION     [facility]- evaluation for Kidney/ pancreas transplant  . EYE SURGERY Bilateral    "for glaucoma"  . INGUINAL HERNIA REPAIR Right    As a child  . IR DIALY SHUNT INTRO NEEDLE/INTRACATH INITIAL W/IMG RIGHT Right 09/29/2017  . LOWER EXTREMITY ANGIOGRAPHY N/A 05/27/2017   Procedure: LOWER EXTREMITY ANGIOGRAPHY;  Surgeon: [name], MD;  Location: [facility];  Service: Cardiovascular;  Laterality: N/A;  . STUMP REVISION Right 07/25/2017   Procedure: REVISION RIGHT BELOW KNEE AMPUTATION;;  Surgeon: [name], MD;  Location: [facility];  Service: Orthopedics;  Laterality: Right;  . WISDOM TOOTH EXTRACTION  1987    There were no vitals filed for this visit.  Subjective Assessment - 10/22/17 7741  Subjective  No new complaints. No falls or pain to report.     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 He wants to be active with 2 children (19yo & 16yo) who play sports.     Currently in Pain?  No/denies           [facility] Adult PT Treatment/Exercise - 10/22/17 0938      Transfers   Transfers  Sit to Stand;Stand to Sit    Sit to Stand  5: Supervision;With upper extremity assist;With  armrests;From chair/3-in-1    Stand to Sit  5: Supervision;With upper extremity assist;With armrests;To chair/3-in-1      Ambulation/Gait   Ambulation/Gait  Yes    Ambulation/Gait Assistance  5: Supervision    Ambulation/Gait Assistance Details  pt arrived using RW. worked on step length, and weight shifting with RW. Last 3 reps of gait were with straight cane with rubber quad tip with min assist downgrading to min guard assist. Worked on sock ply management with all gait to find best fit.  Pt continues to need cues on posture, weight shifitng and stance time on prosthesis.     Ambulation Distance (Feet)  80 Feet   x2, plus around gym with activity, 120 x3   Assistive device  Rolling walker;Prosthesis;Straight cane    Gait Pattern  Step-to pattern;Decreased arm swing - right;Decreased step length - left;Decreased stance time - right;Decreased stride length;Decreased hip/knee flexion - right;Decreased weight shift to right;Right circumduction;Right hip hike;Right flexed knee in stance;Antalgic;Trunk flexed;Abducted- right;Poor foot clearance - right    Ambulation Surface  Level;Indoor    Stairs  Yes    Stairs Assistance  5: Supervision    Stairs Assistance Details (indicate cue type and reason)  PTA demo'd technique prior to pt performance. 1st rep with bil rails with supervision and reminder cues. 2cd rep with single rail, supervision. cues needed each time for hand advancement on rail, and weight shifting.     Stair Management Technique  One rail Right;Two rails;Step to pattern;Forwards    Number of Stairs  4   x2 reps   Ramp  Other (comment)   min guard assist x 2 reps   Ramp Details (indicate cue type and reason)  with RW/prosthesis. PTA demo'd prior to pt performance. min guard assist with both reps with reminder cues on posture, step length and technique.       Prosthetics   Prosthetic Care Comments   pt to increase wear time to 3 hours 2x day.     Current prosthetic wear tolerance  (days/week)   daily    Current prosthetic wear tolerance (#hours/day)   2 hours 2 x day    Residual limb condition   intact with no  issues. no openings today on incision.     Education Provided  Residual limb care;Correct ply sock adjustment;Proper wear schedule/adjustment;Proper weight-bearing schedule/adjustment    Person(s) Educated  Patient    Education Method  Explanation;Demonstration;Verbal cues    Education Method  Verbalized understanding;Returned demonstration;Verbal cues required;Needs further instruction         PT Education - 10/22/17 0951    Education Details  sink HEP for balance and proprioception    Person(s) Educated  Patient  Methods  Explanation;Demonstration;Verbal cues;Handout    Comprehension  Verbalized understanding;Returned demonstration;Verbal cues required;Need further instruction       PT Short Term Goals - 10/21/17 2300      PT SHORT TERM GOAL #1   Title  Patient tolerates prosthesis wear >12hrs total /day without skin issues. (All STGs Target Date: 11/19/2017)    Time  1    Period  Months    Status  New    Target Date  11/19/17      PT SHORT TERM GOAL #2   Title  Patient verbalizes proper adjusting ply socks with limb volume changes.     Time  1    Period  Months    Status  New    Target Date  11/19/17      PT SHORT TERM GOAL #3   Title  Patient ambulates 300' with cane & prosthesis with supervision.     Time  1    Period  Months    Status  New    Target Date  11/19/17      PT SHORT TERM GOAL #4   Title  Patient negotiates ramps, curbs & stairs single rail with cane with supervision.     Time  1    Period  Months    Status  New    Target Date  11/19/17      PT SHORT TERM GOAL #5   Title  Patient is able to pick up 5# from floor with supervision.     Time  1    Period  Months    Status  New    Target Date  11/19/17        PT Long Term Goals - 10/21/17 2252      PT LONG TERM GOAL #1   Title  Patient verbalizes &  demonstrates understanding of prosthetic care to enable safe use of prosthesis. (All LTGs Target Date: 01/16/2018)    Time  3    Period  Months    Status  New    Target Date  01/16/18      PT LONG TERM GOAL #2   Title  Patient tolerates prosthesis wear >90% of awake hours without skin issues to enable function throughout the day.     Time  3    Period  Months    Status  New    Target Date  01/16/18      PT LONG TERM GOAL #3   Title  Berg Balance >/= 48/56 to indicate lower fall risk.     Time  3    Period  Months    Status  New    Target Date  01/16/18      PT LONG TERM GOAL #4   Title  Functional Gait Assessment >19/30 with prosthesis only to indicate lower fall risk.     Time  3    Period  Months    Status  New    Target Date  01/16/18      PT LONG TERM GOAL #5   Title  Patient ambulates >500' with prosthesis only outdoors including grass, ramps, curbs & stairs modified independent.     Time  3    Period  Months    Status  New    Target Date  01/16/18            Plan - 10/22/17 2703    Clinical Impression Statement  Today's skilled session continued to focus on  prosthetic education, issued Sink HEP for balance/proprioception and gait with RW/cane. Pt is making steady progress toward goals and should benefit from continued PT to progress toward unmet goals .    Rehab Potential  Good    PT Frequency  2x / week    PT Duration  Other (comment)   13 weeks (90 days)   PT Treatment/Interventions  ADLs/Self Care Home Management;Canalith Repostioning;DME Instruction;Gait training;Stair training;Functional mobility training;Therapeutic activities;Therapeutic exercise;Balance training;Neuromuscular re-education;Patient/family education;Prosthetic Training;Vestibular    PT Next Visit Plan  gait with cane, barriers with cane. initiate balance activities for decreased UE support. continue with prosthetic education.     Consulted and Agree with Plan of Care  Patient        Patient will benefit from skilled therapeutic intervention in order to improve the following deficits and impairments:  Abnormal gait, Decreased activity tolerance, Decreased balance, Decreased endurance, Decreased mobility, Decreased skin integrity, Decreased strength, Dizziness, Impaired flexibility, Postural dysfunction, Prosthetic Dependency  Visit Diagnosis: Unsteadiness on feet  Other abnormalities of gait and mobility  Abnormal posture  Muscle weakness (generalized)     Problem List Patient Active Problem List   Diagnosis Date Noted  . Type 1 diabetes mellitus with other circulatory complications ([place]) 45/62/5638  . Cardiac arrest ([place]) 09/24/2017  . Acute respiratory failure with hypoxia ([place]) 08/07/2017  . Respiratory distress 08/07/2017  . Dehiscence of amputation stump ([place]) 07/17/2017  . Benign essential HTN   . Chronic diastolic (congestive) heart failure ([place])   . Labile blood glucose   . Labile blood pressure   . Vomiting   . Phantom limb pain ([place])   . Poorly controlled type 2 diabetes mellitus with peripheral neuropathy ([place])   . Unilateral complete BKA, right, sequela ([place])   . Amputation of right lower extremity below knee ([place]) 06/10/2017  . ESRD on dialysis ([place])   . Chronic diastolic congestive heart failure ([place])   . History of PSVT (paroxysmal supraventricular tachycardia)   . Diabetes mellitus type 2 in nonobese (HCC)   . Post-operative pain   . Acute blood loss anemia   . Anemia of chronic disease   . Leukocytosis   . Tachycardia   . History of right below knee amputation ([place]) 06/06/2017  . PVD (peripheral vascular disease) ([place]) 05/13/2017  . Bilateral pleural effusion 11/28/2015  . Loculated pleural effusion 11/28/2015  . HCAP (healthcare-associated pneumonia) 11/28/2015  . ESRD (end stage renal disease) on dialysis ([place]) 11/12/2015  . Hyperkalemia 11/12/2015  . Shoulder pain, acute 11/12/2015  . Nausea & vomiting 11/12/2015  .  Diarrhea 11/12/2015  . Hypertensive urgency 05/05/2014  . Nephrotic syndrome 12/29/2013  . Hyponatremia 12/28/2013  . Acute diastolic heart failure ([place]) 12/28/2013  . Hypoglycemia 12/28/2013  . Dyspnea   . Acute renal failure syndrome ([place])   . Diabetic ketoacidosis without coma associated with type 1 diabetes mellitus ([place])   . Demand ischemia ([place]) 12/24/2013  . DKA (diabetic ketoacidoses) ([place]) 12/23/2013  . CAP (community acquired pneumonia) 12/23/2013  . Sepsis due to pneumonia ([place]) 12/23/2013  . Metabolic acidemia 93/73/4287  . Acute renal failure ([place]) 12/23/2013  . Diabetic ketoacidosis ([place]) 12/23/2013  . Chronic diastolic heart failure ([place]) 04/14/2013  . PSVT (paroxysmal supraventricular tachycardia) ([place]) 04/14/2013  . Bilateral leg edema 03/26/2013  . Chronic kidney disease, stage III (moderate) ([place]) 09/29/2011  . Chest pain 09/26/2010  . Type 1 diabetes mellitus with end-stage renal disease ([place]) 09/26/2010  . Essential hypertension, benign 09/26/2010  . Mixed hyperlipidemia 09/26/2010  Willow Ora, PTA, [facility] [address] [place], [place] [address] [phone] 10/23/17, 10:59 AM   Name: DALTON MOLESWORTH MRN: [id number] Date of Birth: 04/14/1970

## 2017-10-23 NOTE — Patient Instructions (Signed)

## 2017-10-24 DIAGNOSIS — D509 Iron deficiency anemia, unspecified: Secondary | ICD-10-CM | POA: Diagnosis not present

## 2017-10-24 DIAGNOSIS — D631 Anemia in chronic kidney disease: Secondary | ICD-10-CM | POA: Diagnosis not present

## 2017-10-24 DIAGNOSIS — E440 Moderate protein-calorie malnutrition: Secondary | ICD-10-CM | POA: Diagnosis not present

## 2017-10-24 DIAGNOSIS — Z79899 Other long term (current) drug therapy: Secondary | ICD-10-CM | POA: Diagnosis not present

## 2017-10-24 DIAGNOSIS — R509 Fever, unspecified: Secondary | ICD-10-CM | POA: Diagnosis not present

## 2017-10-24 DIAGNOSIS — N186 End stage renal disease: Secondary | ICD-10-CM | POA: Diagnosis not present

## 2017-10-27 ENCOUNTER — Ambulatory Visit: Payer: Medicare Other | Admitting: Physical Therapy

## 2017-10-27 ENCOUNTER — Encounter: Payer: Self-pay | Admitting: Physical Therapy

## 2017-10-27 DIAGNOSIS — M6281 Muscle weakness (generalized): Secondary | ICD-10-CM

## 2017-10-27 DIAGNOSIS — E440 Moderate protein-calorie malnutrition: Secondary | ICD-10-CM | POA: Diagnosis not present

## 2017-10-27 DIAGNOSIS — D509 Iron deficiency anemia, unspecified: Secondary | ICD-10-CM | POA: Diagnosis not present

## 2017-10-27 DIAGNOSIS — D631 Anemia in chronic kidney disease: Secondary | ICD-10-CM | POA: Diagnosis not present

## 2017-10-27 DIAGNOSIS — N186 End stage renal disease: Secondary | ICD-10-CM | POA: Diagnosis not present

## 2017-10-27 DIAGNOSIS — R2681 Unsteadiness on feet: Secondary | ICD-10-CM

## 2017-10-27 DIAGNOSIS — R509 Fever, unspecified: Secondary | ICD-10-CM | POA: Diagnosis not present

## 2017-10-27 DIAGNOSIS — R2689 Other abnormalities of gait and mobility: Secondary | ICD-10-CM

## 2017-10-27 DIAGNOSIS — R293 Abnormal posture: Secondary | ICD-10-CM | POA: Diagnosis not present

## 2017-10-27 DIAGNOSIS — Z79899 Other long term (current) drug therapy: Secondary | ICD-10-CM | POA: Diagnosis not present

## 2017-10-27 NOTE — Therapy (Signed)
[facility] [address] [place], [place], [address] Phone: [phone]   Fax:  [phone]  Physical Therapy Treatment  Patient Details  Name: Lance [name] MRN: [id number] Date of Birth: [birth year] Referring Provider: [name], MD   Encounter Date: 10/27/2017  PT End of Session - 10/27/17 1018    Visit Number  3    Number of Visits  26    Date for PT Re-Evaluation  01/16/18    Authorization Type  Medicare & BCBS     Authorization Time Period  $3100 oop max has been met.  VL PT & OT combined 60 visits with zero met at PT eval    PT Start Time  0932    PT Stop Time  1013    PT Time Calculation (min)  41 min    Equipment Utilized During Treatment  Gait belt    Activity Tolerance  Patient tolerated treatment well    Behavior During Therapy  WFL for tasks assessed/performed       Past Medical History:  Diagnosis Date  . Aortic stenosis    Very mild in 05/2007  . Arthritis    "right shoulder; right knee; feel like I've got it all over" (11/28/2015)  . CHF (congestive heart failure) ([place])   . Degenerative joint disease    Left TKA  . Diastolic heart failure (HCC)    LVEF 65-70% with grade 2 diastolic dysfunction  . ESRD (end stage renal disease) on dialysis [facility])    (as of 09/24/2017) pt does HD at home on a M/Tu/Th/F schedule.  . Essential hypertension, benign 2000   LVH  . GERD (gastroesophageal reflux disease)   . HCAP (healthcare-associated pneumonia) 11/28/2015  . Hyperlipidemia 2000  . Loculated pleural effusion 11/28/2015   [name] 11/28/2015  . Pneumonia 12/2013; 05/2014  . PSVT (paroxysmal supraventricular tachycardia) (HCC)    Nonsustained; asymptomatic; diagnosed by event recorder in 2006  . Tobacco abuse, in remission    20 pack years; discontinued in 1985; bronchitic changes on chest x-ray and 2009  . Type 1 diabetes mellitus ([place]) 1990    Past Surgical History:  Procedure Laterality  Date  . AMPUTATION Right 06/06/2017   Procedure: RIGHT BELOW KNEE AMPUTATION;  Surgeon: [name], MD;  Location: [facility];  Service: Orthopedics;  Laterality: Right;  . APPLICATION OF WOUND VAC Right 07/25/2017   Procedure: APPLICATION OF WOUND VAC;  Surgeon: [name], MD;  Location: [facility];  Service: Orthopedics;  Laterality: Right;  . AV FISTULA PLACEMENT Right 06/23/2014   Procedure: ARTERIOVENOUS (AV) FISTULA CREATION;  Surgeon: Angelia Mould, MD;  Location: [facility];  Service: Vascular;  Laterality: Right;  . CARDIAC CATHETERIZATION     [facility]- evaluation for Kidney/ pancreas transplant  . EYE SURGERY Bilateral    "for glaucoma"  . INGUINAL HERNIA REPAIR Right    As a child  . IR DIALY SHUNT INTRO NEEDLE/INTRACATH INITIAL W/IMG RIGHT Right 09/29/2017  . LOWER EXTREMITY ANGIOGRAPHY N/A 05/27/2017   Procedure: LOWER EXTREMITY ANGIOGRAPHY;  Surgeon: [name], MD;  Location: [facility];  Service: Cardiovascular;  Laterality: N/A;  . STUMP REVISION Right 07/25/2017   Procedure: REVISION RIGHT BELOW KNEE AMPUTATION;;  Surgeon: [name], MD;  Location: [facility];  Service: Orthopedics;  Laterality: Right;  . WISDOM TOOTH EXTRACTION  1987    There were no vitals filed for this visit.  Subjective Assessment - 10/27/17 0932  Subjective  No falls. Wearing prosthesis 3 hrs 2x/day without issues.     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 He wants to be active with 2 children (19yo & 16yo) who play sports.     Currently in Pain?  No/denies                       [facility] Adult PT Treatment/Exercise - 10/27/17 0932      Transfers   Transfers  Sit to Stand;Stand to Sit;Floor to Transfer    Sit to Stand   5: Supervision;With upper extremity assist;From chair/3-in-1   chairs without armrests   Sit to Stand Details  Verbal cues for technique;Visual cues/gestures for sequencing    Stand to Sit  5: Supervision;With upper extremity assist;To chair/3-in-1   chairs without armrests   Stand to Sit Details (indicate cue type and reason)  Verbal cues for technique;Visual cues/gestures for sequencing    Floor to Transfer  5: Supervision;With upper extremity assist   pushing on chair bottom   Floor to Transfer Details (indicate cue type and reason)  demo & verbal cues on technique with TTA prosthesis      Ambulation/Gait   Ambulation/Gait  Yes    Ambulation/Gait Assistance  5: Supervision    Ambulation/Gait Assistance Details  demo, visual, verbal & tactile /prioception for proper step width    Ambulation Distance (Feet)  400 Feet   400' indoors & outdoors   Assistive device  Prosthesis;Straight cane    Gait Pattern  Step-to pattern;Decreased arm swing - right;Decreased step length - left;Decreased stance time - right;Decreased stride length;Decreased hip/knee flexion - right;Decreased weight shift to right;Right circumduction;Right hip hike;Right flexed knee in stance;Antalgic;Trunk flexed;Abducted- right;Poor foot clearance - right    Ambulation Surface  Indoor;Level;Outdoor;Paved    Stairs  Yes    Stairs Assistance  5: Supervision    Stairs Assistance Details (indicate cue type and reason)  demo & verbal cues on alternate pattern with TTA prosthesis. PT instructed only with 2 rails & descending last 5 steps of flight.  Otherwise step-to pattern. Pt verbalized understanding    Stair Management Technique  One rail Right;Step to pattern;Forwards;One rail Left;Wheelchair;Two rails;Alternating pattern    Number of Stairs  4   2 reps step-to & 3 reps alternating   Ramp  5: Supervision   cane & TTA prosthesis   Ramp Details (indicate cue type and reason)  demo & verbal cues on technique with TTA prosthesis     Curb  5: Supervision   cane & prosthesis   Curb Details (indicate cue type and reason)  demo & verbal cues on technique with TTA prosthesis      High Level Balance   High Level Balance Activities  Figure 8 turns;Negotitating around obstacles;Negotiating over obstacles   cane & TTA prosthesis   High Level Balance Comments  demo & verbal cues on technique with TTA prosthesis      Self-Care   Self-Care  Lifting    Lifting  demo & verbal cues on technique with TTA prosthesis      Prosthetics   Prosthetic Care Comments   pt to increase wear time to 4 hours 2x day. Dry  limb/liner half way & prn.    Current prosthetic wear tolerance (days/week)   daily    Current prosthetic wear tolerance (#hours/day)   3 hours 2 x day    Residual limb condition   intact with no  issues. no openings today on incision.     Education Provided  Residual limb care;Correct ply sock adjustment;Proper wear schedule/adjustment;Proper weight-bearing schedule/adjustment    Person(s) Educated  Patient    Education Method  Explanation;Demonstration;Tactile cues;Verbal cues    Education Method  Verbalized understanding;Returned demonstration;Tactile cues required;Verbal cues required;Needs further instruction               PT Short Term Goals - 10/21/17 2300      PT SHORT TERM GOAL #1   Title  Patient tolerates prosthesis wear >12hrs total /day without skin issues. (All STGs Target Date: 11/19/2017)    Time  1    Period  Months    Status  New    Target Date  11/19/17      PT SHORT TERM GOAL #2   Title  Patient verbalizes proper adjusting ply socks with limb volume changes.     Time  1    Period  Months    Status  New    Target Date  11/19/17      PT SHORT TERM GOAL #3   Title  Patient ambulates 300' with cane & prosthesis with supervision.     Time  1    Period  Months    Status  New    Target Date  11/19/17      PT SHORT TERM GOAL #4   Title  Patient negotiates ramps, curbs & stairs single  rail with cane with supervision.     Time  1    Period  Months    Status  New    Target Date  11/19/17      PT SHORT TERM GOAL #5   Title  Patient is able to pick up 5# from floor with supervision.     Time  1    Period  Months    Status  New    Target Date  11/19/17        PT Long Term Goals - 10/21/17 2252      PT LONG TERM GOAL #1   Title  Patient verbalizes & demonstrates understanding of prosthetic care to enable safe use of prosthesis. (All LTGs Target Date: 01/16/2018)    Time  3    Period  Months    Status  New    Target Date  01/16/18      PT LONG TERM GOAL #2   Title  Patient tolerates prosthesis wear >90% of awake hours without skin issues to enable function throughout the day.     Time  3    Period  Months    Status  New    Target Date  01/16/18      PT LONG TERM GOAL #3   Title  Berg Balance >/= 48/56 to indicate lower fall risk.     Time  3    Period  Months    Status  New    Target Date  01/16/18      PT LONG TERM GOAL #4   Title  Functional Gait Assessment >19/30 with prosthesis only to indicate lower fall risk.     Time  3    Period  Months    Status  New  Target Date  01/16/18      PT LONG TERM GOAL #5   Title  Patient ambulates >500' with prosthesis only outdoors including grass, ramps, curbs & stairs modified independent.     Time  3    Period  Months    Status  New    Target Date  01/16/18              Patient will benefit from skilled therapeutic intervention in order to improve the following deficits and impairments:     Visit Diagnosis: Unsteadiness on feet  Other abnormalities of gait and mobility  Abnormal posture  Muscle weakness (generalized)     Problem List Patient Active Problem List   Diagnosis Date Noted  . 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 Cardiac arrest ([place]) 09/24/2017  . Acute respiratory failure with hypoxia ([place]) 08/07/2017  . Respiratory distress  08/07/2017  . Dehiscence of amputation stump ([place]) 07/17/2017  . Benign essential HTN   . Chronic diastolic (congestive) heart failure ([place])   . Labile blood glucose   . Labile blood pressure   . Vomiting   . Phantom limb pain ([place])   . Poorly controlled type 2 diabetes mellitus with peripheral neuropathy ([place])   . Unilateral complete BKA, right, sequela ([place])   . Amputation of right lower extremity below knee ([place]) 06/10/2017  . ESRD on dialysis ([place])   . Chronic diastolic congestive heart failure ([place])   . History of PSVT (paroxysmal supraventricular tachycardia)   . Diabetes mellitus type 2 in nonobese (HCC)   . Post-operative pain   . Acute blood loss anemia   . Anemia of chronic disease   . Leukocytosis   . Tachycardia   . History of right below knee amputation ([place]) 06/06/2017  . PVD (peripheral vascular disease) ([place]) 05/13/2017  . Bilateral pleural effusion 11/28/2015  . Loculated pleural effusion 11/28/2015  . HCAP (healthcare-associated pneumonia) 11/28/2015  . ESRD (end stage renal disease) on dialysis ([place]) 11/12/2015  . Hyperkalemia 11/12/2015  . Shoulder pain, acute 11/12/2015  . Nausea & vomiting 11/12/2015  . Diarrhea 11/12/2015  . Hypertensive urgency 05/05/2014  . Nephrotic syndrome 12/29/2013  . Hyponatremia 12/28/2013  . Acute diastolic heart failure ([place]) 12/28/2013  . Hypoglycemia 12/28/2013  . Dyspnea   . Acute renal failure syndrome ([place])   . Diabetic ketoacidosis without coma associated with type 1 diabetes mellitus ([place])   . Demand ischemia ([place]) 12/24/2013  . DKA (diabetic ketoacidoses) ([place]) 12/23/2013  . CAP (community acquired pneumonia) 12/23/2013  . Sepsis due to pneumonia ([place]) 12/23/2013  . Metabolic acidemia 91/98/0221  . Acute renal failure ([place]) 12/23/2013  . Diabetic ketoacidosis ([place]) 12/23/2013  . Chronic diastolic heart failure ([place]) 04/14/2013  . PSVT (paroxysmal supraventricular tachycardia) ([place]) 04/14/2013  . Bilateral leg  edema 03/26/2013  . Chronic kidney disease, stage III (moderate) ([place]) 09/29/2011  . Chest pain 09/26/2010  . Type 1 diabetes mellitus with end-stage renal disease ([place]) 09/26/2010  . Essential hypertension, benign 09/26/2010  . Mixed hyperlipidemia 09/26/2010    Locke Barrell  PT, DPT 10/27/2017, 10:19 AM  [facility] [address] [place], [place], [address] Phone: [phone]   Fax:  [phone]  Name: Lance [name] MRN: [id number] Date of Birth: March 19, 1970

## 2017-10-28 DIAGNOSIS — D509 Iron deficiency anemia, unspecified: Secondary | ICD-10-CM | POA: Diagnosis not present

## 2017-10-28 DIAGNOSIS — R509 Fever, unspecified: Secondary | ICD-10-CM | POA: Diagnosis not present

## 2017-10-28 DIAGNOSIS — N186 End stage renal disease: Secondary | ICD-10-CM | POA: Diagnosis not present

## 2017-10-28 DIAGNOSIS — Z79899 Other long term (current) drug therapy: Secondary | ICD-10-CM | POA: Diagnosis not present

## 2017-10-28 DIAGNOSIS — E440 Moderate protein-calorie malnutrition: Secondary | ICD-10-CM | POA: Diagnosis not present

## 2017-10-28 DIAGNOSIS — D631 Anemia in chronic kidney disease: Secondary | ICD-10-CM | POA: Diagnosis not present

## 2017-10-30 ENCOUNTER — Encounter: Payer: Self-pay | Admitting: Physical Therapy

## 2017-10-30 ENCOUNTER — Ambulatory Visit: Payer: Medicare Other | Admitting: Physical Therapy

## 2017-10-30 DIAGNOSIS — E440 Moderate protein-calorie malnutrition: Secondary | ICD-10-CM | POA: Diagnosis not present

## 2017-10-30 DIAGNOSIS — R2681 Unsteadiness on feet: Secondary | ICD-10-CM

## 2017-10-30 DIAGNOSIS — D631 Anemia in chronic kidney disease: Secondary | ICD-10-CM | POA: Diagnosis not present

## 2017-10-30 DIAGNOSIS — R2689 Other abnormalities of gait and mobility: Secondary | ICD-10-CM

## 2017-10-30 DIAGNOSIS — M6281 Muscle weakness (generalized): Secondary | ICD-10-CM | POA: Diagnosis not present

## 2017-10-30 DIAGNOSIS — R509 Fever, unspecified: Secondary | ICD-10-CM | POA: Diagnosis not present

## 2017-10-30 DIAGNOSIS — R293 Abnormal posture: Secondary | ICD-10-CM

## 2017-10-30 DIAGNOSIS — D509 Iron deficiency anemia, unspecified: Secondary | ICD-10-CM | POA: Diagnosis not present

## 2017-10-30 DIAGNOSIS — Z79899 Other long term (current) drug therapy: Secondary | ICD-10-CM | POA: Diagnosis not present

## 2017-10-30 DIAGNOSIS — N186 End stage renal disease: Secondary | ICD-10-CM | POA: Diagnosis not present

## 2017-10-31 DIAGNOSIS — Z79899 Other long term (current) drug therapy: Secondary | ICD-10-CM | POA: Diagnosis not present

## 2017-10-31 DIAGNOSIS — D631 Anemia in chronic kidney disease: Secondary | ICD-10-CM | POA: Diagnosis not present

## 2017-10-31 DIAGNOSIS — R509 Fever, unspecified: Secondary | ICD-10-CM | POA: Diagnosis not present

## 2017-10-31 DIAGNOSIS — D509 Iron deficiency anemia, unspecified: Secondary | ICD-10-CM | POA: Diagnosis not present

## 2017-10-31 DIAGNOSIS — N186 End stage renal disease: Secondary | ICD-10-CM | POA: Diagnosis not present

## 2017-10-31 DIAGNOSIS — E440 Moderate protein-calorie malnutrition: Secondary | ICD-10-CM | POA: Diagnosis not present

## 2017-10-31 NOTE — Therapy (Signed)
[facility] [address] [place], [place], [address] Phone: [phone]   Fax:  [phone]  Physical Therapy Treatment  Patient Details  Name: Lance [name] MRN: [id number] Date of Birth: May 19, 1970 Referring Provider: [name], MD   Encounter Date: 10/30/2017  PT End of Session - 10/30/17 0935    Visit Number  4    Number of Visits  26    Date for PT Re-Evaluation  01/16/18    Authorization Type  Medicare & BCBS     Authorization Time Period  $3100 oop max has been met.  VL PT & OT combined 60 visits with zero met at PT eval    PT Start Time  0932    PT Stop Time  1012    PT Time Calculation (min)  40 min    Equipment Utilized During Treatment  Gait belt    Activity Tolerance  Patient tolerated treatment well    Behavior During Therapy  WFL for tasks assessed/performed       Past Medical History:  Diagnosis Date  . Aortic stenosis    Very mild in 05/2007  . Arthritis    "right shoulder; right knee; feel like I've got it all over" (11/28/2015)  . CHF (congestive heart failure) ([place])   . Degenerative joint disease    Left TKA  . Diastolic heart failure (HCC)    LVEF 65-70% with grade 2 diastolic dysfunction  . ESRD (end stage renal disease) on dialysis [facility])    (as of 09/24/2017) pt does HD at home on a M/Tu/Th/F schedule.  . Essential hypertension, benign 2000   LVH  . GERD (gastroesophageal reflux disease)   . HCAP (healthcare-associated pneumonia) 11/28/2015  . Hyperlipidemia 2000  . Loculated pleural effusion 11/28/2015   [name] 11/28/2015  . Pneumonia 12/2013; 05/2014  . PSVT (paroxysmal supraventricular tachycardia) (HCC)    Nonsustained; asymptomatic; diagnosed by event recorder in 2006  . Tobacco abuse, in remission    20 pack years; discontinued in 1985; bronchitic changes on chest x-ray and 2009  . Type 1 diabetes mellitus ([place]) 1990    Past Surgical History:  Procedure Laterality  Date  . AMPUTATION Right 06/06/2017   Procedure: RIGHT BELOW KNEE AMPUTATION;  Surgeon: [name], MD;  Location: [facility];  Service: Orthopedics;  Laterality: Right;  . APPLICATION OF WOUND VAC Right 07/25/2017   Procedure: APPLICATION OF WOUND VAC;  Surgeon: [name], MD;  Location: [facility];  Service: Orthopedics;  Laterality: Right;  . AV FISTULA PLACEMENT Right 06/23/2014   Procedure: ARTERIOVENOUS (AV) FISTULA CREATION;  Surgeon: Angelia Mould, MD;  Location: [facility];  Service: Vascular;  Laterality: Right;  . CARDIAC CATHETERIZATION     [facility]- evaluation for Kidney/ pancreas transplant  . EYE SURGERY Bilateral    "for glaucoma"  . INGUINAL HERNIA REPAIR Right    As a child  . IR DIALY SHUNT INTRO NEEDLE/INTRACATH INITIAL W/IMG RIGHT Right 09/29/2017  . LOWER EXTREMITY ANGIOGRAPHY N/A 05/27/2017   Procedure: LOWER EXTREMITY ANGIOGRAPHY;  Surgeon: [name], MD;  Location: [facility];  Service: Cardiovascular;  Laterality: N/A;  . STUMP REVISION Right 07/25/2017   Procedure: REVISION RIGHT BELOW KNEE AMPUTATION;;  Surgeon: [name], MD;  Location: [facility];  Service: Orthopedics;  Laterality: Right;  . WISDOM TOOTH EXTRACTION  1987    There were no vitals filed for this visit.  Subjective Assessment - 10/30/17 0934  Subjective  No new complaints. No falls or pain to report.     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 He wants to be active with 2 children (19yo & 16yo) who play sports.     Currently in Pain?  No/denies           [facility] Adult PT Treatment/Exercise - 10/30/17 0936      Transfers   Transfers  Sit to Stand;Stand to Sit    Sit to Stand  5: Supervision;With upper extremity assist;From  chair/3-in-1    Stand to Sit  5: Supervision;With upper extremity assist;To chair/3-in-1      Ambulation/Gait   Ambulation/Gait  Yes    Ambulation/Gait Assistance  5: Supervision    Ambulation/Gait Assistance Details  occasional cues on posture and weight shifting needed with gait    Ambulation Distance (Feet)  450 Feet   x1, plus around gym with activity   Assistive device  Prosthesis;Straight cane    Gait Pattern  Step-through pattern;Decreased stride length;Decreased stance time - right;Decreased weight shift to right;Narrow base of support    Ambulation Surface  Level;Indoor    Stairs  Yes    Stairs Assistance  5: Supervision;4: Min guard    Stairs Assistance Details (indicate cue type and reason)  started with 2 rail assist, progressing to single rail assist with 1 rep each, reciprocal pattern each time to ascend and descend. reminder cues for prosthetic foot placement with descending and for weight shifting.     Stair Management Technique  One rail Right;One rail Left;Two rails;Alternating pattern;Forwards;With cane    Number of Stairs  4    Height of Stairs  6      High Level Balance   High Level Balance Activities  Figure 8 turns;Negotitating around obstacles;Negotiating over obstacles;Side stepping;Backward walking;Marching forwards    High Level Balance Comments  with cane/prosthesis: stepping over bolsters of varied heights and figure 8's around hoola hoops with cues on sequencing, technique and pelvic alignment; in parallel bars without UE support: side stepping left<>right x 3 laps each way, then fwd marching/bwd walking x 3 laps each way. cues on weight shifting, posture and ex form/technique. min guard to min assist for balance with all activities performed.                       Therapeutic Activites    Therapeutic Activities  Lifting    Lifting  20# crate on floor and setting back down. PTA demo'd technique with proper prosthetic foot placement, pt returned demo for 5 full  reps with min assist downgrading to min assist for balance. cues needed on stance position and body mechanics.       Knee/Hip Exercises: Aerobic   Tread Mill  instructed pt in how to step onto/off off treadmill. pt was able to return demo with bil UE support, self adjust speed/incline and stop after 3 mintues with only supervision assistance.        Prosthetics   Prosthetic Care Comments   pt educated in use of anti-perspirant below knee adn baby oil at patella up onto thigh to assist with sweat management and decreased friction of liner, repectively.    Current prosthetic wear  tolerance (days/week)   daily    Current prosthetic wear tolerance (#hours/day)   4 hours 2x day    Residual limb condition   intact with no  issues. no openings today on incision.     Education Provided  Residual limb care;Correct ply sock adjustment;Proper wear schedule/adjustment;Proper weight-bearing schedule/adjustment    Person(s) Educated  Patient    Education Method  Explanation;Demonstration;Handout    Education Method  Verbalized understanding;Returned demonstration;Verbal cues required;Needs further Land Prosthesis  Supervision    Doffing Prosthesis  Supervision          Balance Exercises - 10/30/17 1008      Balance Exercises: Standing   Rockerboard  Anterior/posterior;Head turns;EO;EC;30 seconds;10 reps      Balance Exercises: Standing   Rebounder Limitations  performed both ways on balance board with no UE support: holding board steady with alternating UE raises, bil UE raises x 10 reps each; holding board steady: EC no head movements, progressing to EC with head movements left<>right, up<>down. cues on posture and weight shifting                                   PT Short Term Goals - 10/21/17 2300      PT SHORT TERM GOAL #1   Title  Patient tolerates prosthesis wear >12hrs total /day without skin issues. (All STGs Target Date: 11/19/2017)    Time  1    Period  Months     Status  New    Target Date  11/19/17      PT SHORT TERM GOAL #2   Title  Patient verbalizes proper adjusting ply socks with limb volume changes.     Time  1    Period  Months    Status  New    Target Date  11/19/17      PT SHORT TERM GOAL #3   Title  Patient ambulates 300' with cane & prosthesis with supervision.     Time  1    Period  Months    Status  New    Target Date  11/19/17      PT SHORT TERM GOAL #4   Title  Patient negotiates ramps, curbs & stairs single rail with cane with supervision.     Time  1    Period  Months    Status  New    Target Date  11/19/17      PT SHORT TERM GOAL #5   Title  Patient is able to pick up 5# from floor with supervision.     Time  1    Period  Months    Status  New    Target Date  11/19/17        PT Long Term Goals - 10/21/17 2252      PT LONG TERM GOAL #1   Title  Patient verbalizes & demonstrates understanding of prosthetic care to enable safe use of prosthesis. (All LTGs Target Date: 01/16/2018)    Time  3    Period  Months    Status  New    Target Date  01/16/18      PT LONG TERM GOAL #2   Title  Patient tolerates prosthesis wear >90% of awake hours without skin issues to enable function throughout the day.     Time  3    Period  Months  Status  New    Target Date  01/16/18      PT LONG TERM GOAL #3   Title  Berg Balance >/= 48/56 to indicate lower fall risk.     Time  3    Period  Months    Status  New    Target Date  01/16/18      PT LONG TERM GOAL #4   Title  Functional Gait Assessment >19/30 with prosthesis only to indicate lower fall risk.     Time  3    Period  Months    Status  New    Target Date  01/16/18      PT LONG TERM GOAL #5   Title  Patient ambulates >500' with prosthesis only outdoors including grass, ramps, curbs & stairs modified independent.     Time  3    Period  Months    Status  New    Target Date  01/16/18            Plan - 10/30/17 0935    Clinical Impression Statement   Today's skilled session continued to address prosthetic education, gait/barriers with cane/prosthesis and began to address balance reactions. No issues reported with session. The pt is progressing toward goals and should benefit from continued PT to progress toward unmet goals.     Rehab Potential  Good    PT Frequency  2x / week    PT Duration  Other (comment)   13 weeks (90 days)   PT Treatment/Interventions  ADLs/Self Care Home Management;Canalith Repostioning;DME Instruction;Gait training;Stair training;Functional mobility training;Therapeutic activities;Therapeutic exercise;Balance training;Neuromuscular re-education;Patient/family education;Prosthetic Training;Vestibular    PT Next Visit Plan  gait/barriers with cane. continue balance activities for decreased UE support. continue with prosthetic education, begin short distance gait with prosthesis only    Consulted and Agree with Plan of Care  Patient       Patient will benefit from skilled therapeutic intervention in order to improve the following deficits and impairments:  Abnormal gait, Decreased activity tolerance, Decreased balance, Decreased endurance, Decreased mobility, Decreased skin integrity, Decreased strength, Dizziness, Impaired flexibility, Postural dysfunction, Prosthetic Dependency  Visit Diagnosis: Unsteadiness on feet  Other abnormalities of gait and mobility  Abnormal posture  Muscle weakness (generalized)     Problem List Patient Active Problem List   Diagnosis Date Noted  . Type 1 diabetes mellitus with other circulatory complications ([place]) 95/63/8756  . Cardiac arrest ([place]) 09/24/2017  . Acute respiratory failure with hypoxia ([place]) 08/07/2017  . Respiratory distress 08/07/2017  . Dehiscence of amputation stump ([place]) 07/17/2017  . Benign essential HTN   . Chronic diastolic (congestive) heart failure ([place])   . Labile blood glucose   . Labile blood pressure   . Vomiting   . Phantom limb pain ([place])   .  Poorly controlled type 2 diabetes mellitus with peripheral neuropathy ([place])   . Unilateral complete BKA, right, sequela ([place])   . Amputation of right lower extremity below knee ([place]) 06/10/2017  . ESRD on dialysis ([place])   . Chronic diastolic congestive heart failure ([place])   . History of PSVT (paroxysmal supraventricular tachycardia)   . Diabetes mellitus type 2 in nonobese (HCC)   . Post-operative pain   . Acute blood loss anemia   . Anemia of chronic disease   . Leukocytosis   . Tachycardia   . History of right below knee amputation ([place]) 06/06/2017  . PVD (peripheral vascular disease) ([place]) 05/13/2017  . Bilateral pleural effusion 11/28/2015  .  Loculated pleural effusion 11/28/2015  . HCAP (healthcare-associated pneumonia) 11/28/2015  . ESRD (end stage renal disease) on dialysis ([place]) 11/12/2015  . Hyperkalemia 11/12/2015  . Shoulder pain, acute 11/12/2015  . Nausea & vomiting 11/12/2015  . Diarrhea 11/12/2015  . Hypertensive urgency 05/05/2014  . Nephrotic syndrome 12/29/2013  . Hyponatremia 12/28/2013  . Acute diastolic heart failure ([place]) 12/28/2013  . Hypoglycemia 12/28/2013  . Dyspnea   . Acute renal failure syndrome ([place])   . Diabetic ketoacidosis without coma associated with type 1 diabetes mellitus ([place])   . Demand ischemia ([place]) 12/24/2013  . DKA (diabetic ketoacidoses) ([place]) 12/23/2013  . CAP (community acquired pneumonia) 12/23/2013  . Sepsis due to pneumonia ([place]) 12/23/2013  . Metabolic acidemia 25/63/8937  . Acute renal failure ([place]) 12/23/2013  . Diabetic ketoacidosis ([place]) 12/23/2013  . Chronic diastolic heart failure ([place]) 04/14/2013  . PSVT (paroxysmal supraventricular tachycardia) ([place]) 04/14/2013  . Bilateral leg edema 03/26/2013  . Chronic kidney disease, stage III (moderate) ([place]) 09/29/2011  . Chest pain 09/26/2010  . Type 1 diabetes mellitus with end-stage renal disease ([place]) 09/26/2010  . Essential hypertension, benign 09/26/2010  . Mixed  hyperlipidemia 09/26/2010    Willow Ora, PTA, [facility] Outpatient Neuro [facility] [address] [place], [place] [address] [phone] 10/31/17, 9:54 AM   Name: Lance [name] MRN: [id number] Date of Birth: [birth year]

## 2017-11-03 ENCOUNTER — Ambulatory Visit: Payer: Medicare Other | Admitting: Physical Therapy

## 2017-11-03 ENCOUNTER — Encounter: Payer: Self-pay | Admitting: Physical Therapy

## 2017-11-03 DIAGNOSIS — R2681 Unsteadiness on feet: Secondary | ICD-10-CM | POA: Diagnosis not present

## 2017-11-03 DIAGNOSIS — R2689 Other abnormalities of gait and mobility: Secondary | ICD-10-CM | POA: Diagnosis not present

## 2017-11-03 DIAGNOSIS — D631 Anemia in chronic kidney disease: Secondary | ICD-10-CM | POA: Diagnosis not present

## 2017-11-03 DIAGNOSIS — E440 Moderate protein-calorie malnutrition: Secondary | ICD-10-CM | POA: Diagnosis not present

## 2017-11-03 DIAGNOSIS — M6281 Muscle weakness (generalized): Secondary | ICD-10-CM | POA: Diagnosis not present

## 2017-11-03 DIAGNOSIS — R293 Abnormal posture: Secondary | ICD-10-CM

## 2017-11-03 DIAGNOSIS — N186 End stage renal disease: Secondary | ICD-10-CM | POA: Diagnosis not present

## 2017-11-03 DIAGNOSIS — D509 Iron deficiency anemia, unspecified: Secondary | ICD-10-CM | POA: Diagnosis not present

## 2017-11-03 DIAGNOSIS — R509 Fever, unspecified: Secondary | ICD-10-CM | POA: Diagnosis not present

## 2017-11-03 DIAGNOSIS — Z79899 Other long term (current) drug therapy: Secondary | ICD-10-CM | POA: Diagnosis not present

## 2017-11-04 DIAGNOSIS — N186 End stage renal disease: Secondary | ICD-10-CM | POA: Diagnosis not present

## 2017-11-04 DIAGNOSIS — E440 Moderate protein-calorie malnutrition: Secondary | ICD-10-CM | POA: Diagnosis not present

## 2017-11-04 DIAGNOSIS — D509 Iron deficiency anemia, unspecified: Secondary | ICD-10-CM | POA: Diagnosis not present

## 2017-11-04 DIAGNOSIS — R509 Fever, unspecified: Secondary | ICD-10-CM | POA: Diagnosis not present

## 2017-11-04 DIAGNOSIS — D631 Anemia in chronic kidney disease: Secondary | ICD-10-CM | POA: Diagnosis not present

## 2017-11-04 DIAGNOSIS — Z79899 Other long term (current) drug therapy: Secondary | ICD-10-CM | POA: Diagnosis not present

## 2017-11-04 NOTE — Therapy (Signed)
[facility] [address] [place], [place], [address] Phone: [phone]   Fax:  [phone]  Physical Therapy Treatment  Patient Details  Name: Lance [name] MRN: [id number] Date of Birth: [birth year] Referring Provider: [name], MD   Encounter Date: 11/03/2017  PT End of Session - 11/03/17 0937    Visit Number  5    Number of Visits  26    Date for PT Re-Evaluation  01/16/18    Authorization Type  Medicare & BCBS     Authorization Time Period  $3100 oop max has been met.  VL PT & OT combined 60 visits with zero met at PT eval    PT Start Time  0932    PT Stop Time  1010    PT Time Calculation (min)  38 min    Equipment Utilized During Treatment  Gait belt    Activity Tolerance  Patient tolerated treatment well;No increased pain    Behavior During Therapy  WFL for tasks assessed/performed       Past Medical History:  Diagnosis Date  . Aortic stenosis    Very mild in 05/2007  . Arthritis    "right shoulder; right knee; feel like I've got it all over" (11/28/2015)  . CHF (congestive heart failure) ([place])   . Degenerative joint disease    Left TKA  . Diastolic heart failure (HCC)    LVEF 65-70% with grade 2 diastolic dysfunction  . ESRD (end stage renal disease) on dialysis [facility])    (as of 09/24/2017) pt does HD at home on a M/Tu/Th/F schedule.  . Essential hypertension, benign 2000   LVH  . GERD (gastroesophageal reflux disease)   . HCAP (healthcare-associated pneumonia) 11/28/2015  . Hyperlipidemia 2000  . Loculated pleural effusion 11/28/2015   [name] 11/28/2015  . Pneumonia 12/2013; 05/2014  . PSVT (paroxysmal supraventricular tachycardia) (HCC)    Nonsustained; asymptomatic; diagnosed by event recorder in 2006  . Tobacco abuse, in remission    20 pack years; discontinued in 1985; bronchitic changes on chest x-ray and 2009  . Type 1 diabetes mellitus ([place]) 1990    Past Surgical History:   Procedure Laterality Date  . AMPUTATION Right 06/06/2017   Procedure: RIGHT BELOW KNEE AMPUTATION;  Surgeon: [name], MD;  Location: [facility];  Service: Orthopedics;  Laterality: Right;  . APPLICATION OF WOUND VAC Right 07/25/2017   Procedure: APPLICATION OF WOUND VAC;  Surgeon: [name], MD;  Location: [employer] [facility];  Service: Orthopedics;  Laterality: Right;  . AV FISTULA PLACEMENT Right 06/23/2014   Procedure: ARTERIOVENOUS (AV) FISTULA CREATION;  Surgeon: Angelia Mould, MD;  Location: [facility];  Service: Vascular;  Laterality: Right;  . CARDIAC CATHETERIZATION     [facility]- evaluation for Kidney/ pancreas transplant  . EYE SURGERY Bilateral    "for glaucoma"  . INGUINAL HERNIA REPAIR Right    As a child  . IR DIALY SHUNT INTRO NEEDLE/INTRACATH INITIAL W/IMG RIGHT Right 09/29/2017  . LOWER EXTREMITY ANGIOGRAPHY N/A 05/27/2017   Procedure: LOWER EXTREMITY ANGIOGRAPHY;  Surgeon: [name], MD;  Location: [facility];  Service: Cardiovascular;  Laterality: N/A;  . STUMP REVISION Right 07/25/2017   Procedure: REVISION RIGHT BELOW KNEE AMPUTATION;;  Surgeon: [name], MD;  Location: [facility];  Service: Orthopedics;  Laterality: Right;  . WISDOM TOOTH EXTRACTION  1987    There were no vitals filed for this visit.  Subjective Assessment - 11/03/17 2355  Subjective  No new complaints. No falls or pain to report.     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 He wants to be active with 2 children (19yo & 16yo) who play sports.     Currently in Pain?  No/denies         11/03/17 9381  Transfers  Transfers Sit to Stand;Stand to Sit  Sit to Stand 5: Supervision;With upper extremity assist;From chair/3-in-1  Stand to Sit 5:  Supervision;With upper extremity assist;To chair/3-in-1  Ambulation/Gait  Ambulation/Gait Yes  Ambulation/Gait Assistance 5: Supervision  Ambulation/Gait Assistance Details no cues or assistance needed with gait on outdoor surfaces with cane/prosthesis; gait around gym with activities without AD with min guard to supervision assist needed   Ambulation Distance (Feet) 500 Feet (x1, + around gym with activities)  Assistive device Prosthesis;Straight cane;None  Gait Pattern Step-through pattern;Decreased stride length;Decreased stance time - right;Decreased weight shift to right;Narrow base of support  Ambulation Surface Level;Unlevel;Indoor;Outdoor;Paved  High Level Balance  High Level Balance Activities Marching forwards;Marching backwards;Tandem walking;Side stepping  High Level Balance Comments on blue mat in parallel bars: side stepping, marching fwd/bwd, tandem fwd/bwd and side stepping x 3 laps each with no to intermittent touch to bars for balance. min guard assist  Neuro Re-ed   Neuro Re-ed Details  for balance/muscle re-ed: gait along ~50 foot hallway with head movements left<>fwd<>right and up<>fwd<>down x 2 laps each. min guard assist with minor veering and decrease in gait speed noted.   Prosthetics  Prosthetic Care Comments  pt to increase wear time to all awake hours, drying as needed every 3-4 hours  Current prosthetic wear tolerance (days/week)  daily  Current prosthetic wear tolerance (#hours/day)  4 hours 2x day  Residual limb condition  intact with no  issues. no openings today on incision.   Education Provided Correct ply sock adjustment;Proper wear schedule/adjustment  Person(s) Educated Patient  Education Method Explanation;Demonstration;Verbal cues  Education Method Verbalized understanding;Returned demonstration;Verbal cues required;Needs further instruction  Donning Prosthesis 5  Doffing Prosthesis 5      11/03/17 0955  Balance Exercises: Standing  Standing Eyes  Closed Narrow base of support (BOS);Wide (BOA);Head turns;Foam/compliant surface;Other reps (comment);30 secs;Limitations  SLS with Vectors Solid surface;Other reps (comment);Limitations  Balance Beam standing across blue foam balance beam: alternating fwd stepping to floor/back onto beam, then alternating bwd stepping to floor/back onto beam. No UE support with intermittent touch to bars for balance assist with up to min assist for balance. cues for increased step length/height and weight shifitng with activity.   Balance Exercises: Standing  Standing Eyes Closed Limitations on airex with light UE touch to parallel bars to no UE support: progressed from wide base of support to narrow base of support with EC no head movements, progressing to EC head movements left<>right, then up<>down. min assist for balance with cues on posture and weight shifting to assist with balance recovery.   SLS with Vectors Limitations 2 tall cones on floor surface, no UE support: alternating fwd toe taps, progressing to alternating cross toe taps. min guard to min assist for balance with cues on stance position and weight shifting.  PT Short Term Goals - 10/21/17 2300      PT SHORT TERM GOAL #1   Title  Patient tolerates prosthesis wear >12hrs total /day without skin issues. (All STGs Target Date: 11/19/2017)    Time  1    Period  Months    Status  New    Target Date  11/19/17      PT SHORT TERM GOAL #2   Title  Patient verbalizes proper adjusting ply socks with limb volume changes.     Time  1    Period  Months    Status  New    Target Date  11/19/17      PT SHORT TERM GOAL #3   Title  Patient ambulates 300' with cane & prosthesis with supervision.     Time  1    Period  Months    Status  New    Target Date  11/19/17      PT SHORT TERM GOAL #4   Title  Patient negotiates ramps, curbs & stairs single rail with cane with supervision.     Time  1    Period  Months     Status  New    Target Date  11/19/17      PT SHORT TERM GOAL #5   Title  Patient is able to pick up 5# from floor with supervision.     Time  1    Period  Months    Status  New    Target Date  11/19/17        PT Long Term Goals - 10/21/17 2252      PT LONG TERM GOAL #1   Title  Patient verbalizes & demonstrates understanding of prosthetic care to enable safe use of prosthesis. (All LTGs Target Date: 01/16/2018)    Time  3    Period  Months    Status  New    Target Date  01/16/18      PT LONG TERM GOAL #2   Title  Patient tolerates prosthesis wear >90% of awake hours without skin issues to enable function throughout the day.     Time  3    Period  Months    Status  New    Target Date  01/16/18      PT LONG TERM GOAL #3   Title  Berg Balance >/= 48/56 to indicate lower fall risk.     Time  3    Period  Months    Status  New    Target Date  01/16/18      PT LONG TERM GOAL #4   Title  Functional Gait Assessment >19/30 with prosthesis only to indicate lower fall risk.     Time  3    Period  Months    Status  New    Target Date  01/16/18      PT LONG TERM GOAL #5   Title  Patient ambulates >500' with prosthesis only outdoors including grass, ramps, curbs & stairs modified independent.     Time  3    Period  Months    Status  New    Target Date  01/16/18         11/03/17 9449  Plan  Clinical Impression Statement Today's skilled session focused on gait with prosthesis only and balance reactions with no issues reported. The pt is progressing toward goals and should benefit from continued PT to progress toward unmet goals.  Pt will benefit from skilled therapeutic intervention in order to improve on the following deficits Abnormal gait;Decreased activity tolerance;Decreased balance;Decreased endurance;Decreased mobility;Decreased skin integrity;Decreased strength;Dizziness;Impaired flexibility;Postural dysfunction;Prosthetic Dependency  Rehab Potential Good  PT  Frequency 2x / week  PT Duration Other (comment) (13 weeks (90 days))  PT Treatment/Interventions ADLs/Self Care Home Management;Canalith Repostioning;DME Instruction;Gait training;Stair training;Functional mobility training;Therapeutic activities;Therapeutic exercise;Balance training;Neuromuscular re-education;Patient/family education;Prosthetic Training;Vestibular  PT Next Visit Plan gait/barriers with cane. continue balance activities for decreased UE support. continue with prosthetic education, begin short distance gait with prosthesis only  Consulted and Agree with Plan of Care Patient        Patient will benefit from skilled therapeutic intervention in order to improve the following deficits and impairments:  Abnormal gait, Decreased activity tolerance, Decreased balance, Decreased endurance, Decreased mobility, Decreased skin integrity, Decreased strength, Dizziness, Impaired flexibility, Postural dysfunction, Prosthetic Dependency  Visit Diagnosis: Unsteadiness on feet  Other abnormalities of gait and mobility  Abnormal posture  Muscle weakness (generalized)     Problem List Patient Active Problem List   Diagnosis Date Noted  . Type 1 diabetes mellitus with other circulatory complications ([place]) 54/65/6812  . Cardiac arrest ([place]) 09/24/2017  . Acute respiratory failure with hypoxia ([place]) 08/07/2017  . Respiratory distress 08/07/2017  . Dehiscence of amputation stump ([place]) 07/17/2017  . Benign essential HTN   . Chronic diastolic (congestive) heart failure ([place])   . Labile blood glucose   . Labile blood pressure   . Vomiting   . Phantom limb pain ([place])   . Poorly controlled type 2 diabetes mellitus with peripheral neuropathy ([place])   . Unilateral complete BKA, right, sequela ([place])   . Amputation of right lower extremity below knee ([place]) 06/10/2017  . ESRD on dialysis ([place])   . Chronic diastolic congestive heart failure ([place])   . History of PSVT (paroxysmal  supraventricular tachycardia)   . Diabetes mellitus type 2 in nonobese (HCC)   . Post-operative pain   . Acute blood loss anemia   . Anemia of chronic disease   . Leukocytosis   . Tachycardia   . History of right below knee amputation ([place]) 06/06/2017  . PVD (peripheral vascular disease) ([place]) 05/13/2017  . Bilateral pleural effusion 11/28/2015  . Loculated pleural effusion 11/28/2015  . HCAP (healthcare-associated pneumonia) 11/28/2015  . ESRD (end stage renal disease) on dialysis ([place]) 11/12/2015  . Hyperkalemia 11/12/2015  . Shoulder pain, acute 11/12/2015  . Nausea & vomiting 11/12/2015  . Diarrhea 11/12/2015  . Hypertensive urgency 05/05/2014  . Nephrotic syndrome 12/29/2013  . Hyponatremia 12/28/2013  . Acute diastolic heart failure ([place]) 12/28/2013  . Hypoglycemia 12/28/2013  . Dyspnea   . Acute renal failure syndrome ([place])   . Diabetic ketoacidosis without coma associated with type 1 diabetes mellitus ([place])   . Demand ischemia ([place]) 12/24/2013  . DKA (diabetic ketoacidoses) ([place]) 12/23/2013  . CAP (community acquired pneumonia) 12/23/2013  . Sepsis due to pneumonia ([place]) 12/23/2013  . Metabolic acidemia 75/17/0017  . Acute renal failure ([place]) 12/23/2013  . Diabetic ketoacidosis ([place]) 12/23/2013  . Chronic diastolic heart failure ([place]) 04/14/2013  . PSVT (paroxysmal supraventricular tachycardia) ([place]) 04/14/2013  . Bilateral leg edema 03/26/2013  . Chronic kidney disease, stage III (moderate) ([place]) 09/29/2011  . Chest pain 09/26/2010  . Type 1 diabetes mellitus with end-stage renal disease ([place]) 09/26/2010  . Essential hypertension, benign 09/26/2010  . Mixed hyperlipidemia 09/26/2010    Willow Ora, PTA, CLT Outpatient Neuro [facility] [address] [place],  [place] [address] [phone] 11/04/17, 10:29 PM   Name: Lance [name] MRN: [id number] Date of Birth: Jul 31, 1970

## 2017-11-05 ENCOUNTER — Encounter: Payer: Self-pay | Admitting: Physical Therapy

## 2017-11-05 ENCOUNTER — Ambulatory Visit: Payer: Medicare Other | Admitting: Physical Therapy

## 2017-11-05 VITALS — BP 110/69 | HR 81

## 2017-11-05 DIAGNOSIS — R293 Abnormal posture: Secondary | ICD-10-CM | POA: Diagnosis not present

## 2017-11-05 DIAGNOSIS — R2689 Other abnormalities of gait and mobility: Secondary | ICD-10-CM | POA: Diagnosis not present

## 2017-11-05 DIAGNOSIS — R2681 Unsteadiness on feet: Secondary | ICD-10-CM

## 2017-11-05 DIAGNOSIS — M6281 Muscle weakness (generalized): Secondary | ICD-10-CM | POA: Diagnosis not present

## 2017-11-05 NOTE — Therapy (Signed)
[facility] [address] [place], [place], [address] Phone: [phone]   Fax:  [phone]  Physical Therapy Treatment  Patient Details  Name: Lance [name] MRN: [id number] Date of Birth: [birth year] Referring Provider (PT): [name], MD   Encounter Date: 11/05/2017  PT End of Session - 11/05/17 1630    Visit Number  6    Number of Visits  26    Date for PT Re-Evaluation  01/16/18    Authorization Type  Medicare & BCBS     Authorization Time Period  $3100 oop max has been met.  VL PT & OT combined 60 visits with zero met at PT eval    PT Start Time  [phone]    PT Stop Time  1000   ended early due to medical issues   PT Time Calculation (min)  27 min    Equipment Utilized During Treatment  Gait belt    Activity Tolerance  Patient tolerated treatment well;No increased pain    Behavior During Therapy  WFL for tasks assessed/performed       Past Medical History:  Diagnosis Date  . Aortic stenosis    Very mild in 05/2007  . Arthritis    "right shoulder; right knee; feel like I've got it all over" (11/28/2015)  . CHF (congestive heart failure) ([place])   . Degenerative joint disease    Left TKA  . Diastolic heart failure (HCC)    LVEF 65-70% with grade 2 diastolic dysfunction  . ESRD (end stage renal disease) on dialysis [facility])    (as of 09/24/2017) pt does HD at home on a M/Tu/Th/F schedule.  . Essential hypertension, benign 2000   LVH  . GERD (gastroesophageal reflux disease)   . HCAP (healthcare-associated pneumonia) 11/28/2015  . Hyperlipidemia 2000  . Loculated pleural effusion 11/28/2015   [name] 11/28/2015  . Pneumonia 12/2013; 05/2014  . PSVT (paroxysmal supraventricular tachycardia) (HCC)    Nonsustained; asymptomatic; diagnosed by event recorder in 2006  . Tobacco abuse, in remission    20 pack years; discontinued in 1985; bronchitic changes on chest x-ray and 2009  . Type 1 diabetes mellitus ([place])  1990    Past Surgical History:  Procedure Laterality Date  . AMPUTATION Right 06/06/2017   Procedure: RIGHT BELOW KNEE AMPUTATION;  Surgeon: [name], MD;  Location: [facility];  Service: Orthopedics;  Laterality: Right;  . APPLICATION OF WOUND VAC Right 07/25/2017   Procedure: APPLICATION OF WOUND VAC;  Surgeon: [name], MD;  Location: [facility];  Service: Orthopedics;  Laterality: Right;  . AV FISTULA PLACEMENT Right 06/23/2014   Procedure: ARTERIOVENOUS (AV) FISTULA CREATION;  Surgeon: Angelia Mould, MD;  Location: [facility];  Service: Vascular;  Laterality: Right;  . CARDIAC CATHETERIZATION     [facility]- evaluation for Kidney/ pancreas transplant  . EYE SURGERY Bilateral    "for glaucoma"  . INGUINAL HERNIA REPAIR Right    As a child  . IR DIALY SHUNT INTRO NEEDLE/INTRACATH INITIAL W/IMG RIGHT Right 09/29/2017  . LOWER EXTREMITY ANGIOGRAPHY N/A 05/27/2017   Procedure: LOWER EXTREMITY ANGIOGRAPHY;  Surgeon: [name], MD;  Location: [facility] [facility];  Service: Cardiovascular;  Laterality: N/A;  . STUMP REVISION Right 07/25/2017   Procedure: REVISION RIGHT BELOW KNEE AMPUTATION;;  Surgeon: [name], MD;  Location: [facility];  Service: Orthopedics;  Laterality: Right;  . WISDOM TOOTH EXTRACTION  1987    Vitals:   11/05/17 0940 11/05/17  0945 11/05/17 0952 11/05/17 0956  BP: (!) 100/56 (!) 87/53 111/64 110/69  Pulse:  85 80 81    Subjective Assessment - 11/05/17 0939    Subjective  Reports he feels his BP is dropping. Dizzy today. This started while riding over here. BP 100/56 upon sitting in gym. Reports it dropped last night as well after home dialysis. Pt states this started happening after Dr. Posey Pronto changed one of his medications last week.     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 He wants to be active with 2 children (19yo & 16yo) who play sports.     Currently in Pain?  No/denies       Treatment:  Has pt sit in chair for 5-7 minutes after walking into clinic and sip on cold water. BP dropped more on recheck with pt continuing to report mild dizziness. Had pt lie down on mat table for 5 minutes. Reports dizziness resolved with lying down. BP was 111/64, HR 80.  Upon sitting up for ~1 minute BP rechecked with readings of 110/69. No dizziness, however pt not feeling well. Session ended. Pt escorted to car via wheelchair by PTA. Primary PT made aware of events this session and over to assess pt as well. Agreed with plan to re-weight prosthesis at next session and hold today. Agreed with pt to see further medical attention if symptoms continue/worsen.     [facility] Adult PT Treatment/Exercise - 11/05/17 1630      Prosthetics   Prosthetic Care Comments   pt educated on proper way to weight prosthesis as he reports the dialysis center placed in on the scale. This could be contributing to his BP dropping with dialysis if incorrect amounts of fluid are being taken off.              Current prosthetic wear tolerance (days/week)   daily    Current prosthetic wear tolerance (#hours/day)   all awake hours with drying as needed    Residual limb condition   intact with no  issues. no openings today on incision.     Education Provided  Correct ply sock adjustment;Proper wear schedule/adjustment    Donning Prosthesis  Supervision    Doffing Prosthesis  Supervision          PT Short Term Goals - 10/21/17 2300      PT SHORT TERM GOAL #1   Title  Patient tolerates prosthesis wear >12hrs total /day without skin issues. (All STGs Target Date: 11/19/2017)    Time  1    Period  Months    Status  New    Target Date  11/19/17      PT SHORT TERM GOAL #2   Title  Patient verbalizes proper adjusting ply socks with limb  volume changes.     Time  1    Period  Months    Status  New    Target Date  11/19/17      PT SHORT TERM GOAL #3   Title  Patient ambulates 300' with cane & prosthesis with supervision.     Time  1    Period  Months    Status  New    Target Date  11/19/17      PT SHORT TERM GOAL #4   Title  Patient negotiates ramps, curbs & stairs single rail with cane with supervision.     Time  1    Period  Months    Status  New    Target Date  11/19/17      PT SHORT TERM GOAL #5   Title  Patient is able to pick up 5# from floor with supervision.     Time  1    Period  Months    Status  New    Target Date  11/19/17        PT Long Term Goals - 10/21/17 2252      PT LONG TERM GOAL #1   Title  Patient verbalizes & demonstrates understanding of prosthetic care to enable safe use of prosthesis. (All LTGs Target Date: 01/16/2018)    Time  3    Period  Months    Status  New    Target Date  01/16/18      PT LONG TERM GOAL #2   Title  Patient tolerates prosthesis wear >90% of awake hours without skin issues to enable function throughout the day.     Time  3    Period  Months    Status  New    Target Date  01/16/18      PT LONG TERM GOAL #3   Title  Berg Balance >/= 48/56 to indicate lower fall risk.     Time  3    Period  Months    Status  New    Target Date  01/16/18      PT LONG TERM GOAL #4   Title  Functional Gait Assessment >19/30 with prosthesis only to indicate lower fall risk.     Time  3    Period  Months    Status  New    Target Date  01/16/18      PT LONG TERM GOAL #5   Title  Patient ambulates >500' with prosthesis only outdoors including grass, ramps, curbs & stairs modified independent.     Time  3    Period  Months    Status  New    Target Date  01/16/18            Plan - 11/05/17 1630    Clinical Impression Statement  Today's skilled session was limited due to pt's BP dropping with upright postions/standing up. Pt's BP did increase with water and  lying down. His dizziness resolved as well. Pt was transported to his car by wheelchair due to continuing to not feel well. He plans to follow up with his nephrologist as well due to this starting after his medication were changed last week. Pt's father made aware of events in clinic and both were advised to seek medical attention at ED/urgent care if symptoms continued/get worse. Both verbalized understanding.     Rehab Potential  Good    PT Frequency  2x / week    PT Duration  Other (comment)   13 weeks (90 days)   PT Treatment/Interventions  ADLs/Self Care Home Management;Canalith Repostioning;DME Instruction;Gait training;Stair training;Functional mobility training;Therapeutic activities;Therapeutic exercise;Balance training;Neuromuscular re-education;Patient/family education;Prosthetic Training;Vestibular    PT Next Visit Plan  gait/barriers with cane. continue balance activities for decreased UE support. continue with prosthetic education, begin short distance gait with prosthesis only    Consulted and Agree with Plan of Care  Patient  Patient will benefit from skilled therapeutic intervention in order to improve the following deficits and impairments:  Abnormal gait, Decreased activity tolerance, Decreased balance, Decreased endurance, Decreased mobility, Decreased skin integrity, Decreased strength, Dizziness, Impaired flexibility, Postural dysfunction, Prosthetic Dependency  Visit Diagnosis: Unsteadiness on feet  Other abnormalities of gait and mobility     Problem List Patient Active Problem List   Diagnosis Date Noted  . Type 1 diabetes mellitus with other circulatory complications ([place]) 15/94/5859  . Cardiac arrest ([place]) 09/24/2017  . Acute respiratory failure with hypoxia ([place]) 08/07/2017  . Respiratory distress 08/07/2017  . Dehiscence of amputation stump ([place]) 07/17/2017  . Benign essential HTN   . Chronic diastolic (congestive) heart failure ([place])   . Labile blood  glucose   . Labile blood pressure   . Vomiting   . Phantom limb pain ([place])   . Poorly controlled type 2 diabetes mellitus with peripheral neuropathy ([place])   . Unilateral complete BKA, right, sequela ([place])   . Amputation of right lower extremity below knee ([place]) 06/10/2017  . ESRD on dialysis ([place])   . Chronic diastolic congestive heart failure ([place])   . History of PSVT (paroxysmal supraventricular tachycardia)   . Diabetes mellitus type 2 in nonobese (HCC)   . Post-operative pain   . Acute blood loss anemia   . Anemia of chronic disease   . Leukocytosis   . Tachycardia   . History of right below knee amputation ([place]) 06/06/2017  . PVD (peripheral vascular disease) ([place]) 05/13/2017  . Bilateral pleural effusion 11/28/2015  . Loculated pleural effusion 11/28/2015  . HCAP (healthcare-associated pneumonia) 11/28/2015  . ESRD (end stage renal disease) on dialysis ([place]) 11/12/2015  . Hyperkalemia 11/12/2015  . Shoulder pain, acute 11/12/2015  . Nausea & vomiting 11/12/2015  . Diarrhea 11/12/2015  . Hypertensive urgency 05/05/2014  . Nephrotic syndrome 12/29/2013  . Hyponatremia 12/28/2013  . Acute diastolic heart failure ([place]) 12/28/2013  . Hypoglycemia 12/28/2013  . Dyspnea   . Acute renal failure syndrome ([place])   . Diabetic ketoacidosis without coma associated with type 1 diabetes mellitus ([place])   . Demand ischemia ([place]) 12/24/2013  . DKA (diabetic ketoacidoses) ([place]) 12/23/2013  . CAP (community acquired pneumonia) 12/23/2013  . Sepsis due to pneumonia ([place]) 12/23/2013  . Metabolic acidemia 29/24/4628  . Acute renal failure ([place]) 12/23/2013  . Diabetic ketoacidosis ([place]) 12/23/2013  . Chronic diastolic heart failure ([place]) 04/14/2013  . PSVT (paroxysmal supraventricular tachycardia) ([place]) 04/14/2013  . Bilateral leg edema 03/26/2013  . Chronic kidney disease, stage III (moderate) ([place]) 09/29/2011  . Chest pain 09/26/2010  . Type 1 diabetes mellitus with end-stage renal  disease ([place]) 09/26/2010  . Essential hypertension, benign 09/26/2010  . Mixed hyperlipidemia 09/26/2010    Willow Ora, PTA, [facility] [address] [place], [place] [address] [phone] 11/06/17, 12:42 PM   Name: KALAN YELEY MRN: [id number] Date of Birth: 23-May-1970

## 2017-11-06 DIAGNOSIS — D509 Iron deficiency anemia, unspecified: Secondary | ICD-10-CM | POA: Diagnosis not present

## 2017-11-06 DIAGNOSIS — E440 Moderate protein-calorie malnutrition: Secondary | ICD-10-CM | POA: Diagnosis not present

## 2017-11-06 DIAGNOSIS — R509 Fever, unspecified: Secondary | ICD-10-CM | POA: Diagnosis not present

## 2017-11-06 DIAGNOSIS — Z79899 Other long term (current) drug therapy: Secondary | ICD-10-CM | POA: Diagnosis not present

## 2017-11-06 DIAGNOSIS — D631 Anemia in chronic kidney disease: Secondary | ICD-10-CM | POA: Diagnosis not present

## 2017-11-06 DIAGNOSIS — N186 End stage renal disease: Secondary | ICD-10-CM | POA: Diagnosis not present

## 2017-11-07 DIAGNOSIS — D631 Anemia in chronic kidney disease: Secondary | ICD-10-CM | POA: Diagnosis not present

## 2017-11-07 DIAGNOSIS — D509 Iron deficiency anemia, unspecified: Secondary | ICD-10-CM | POA: Diagnosis not present

## 2017-11-07 DIAGNOSIS — E440 Moderate protein-calorie malnutrition: Secondary | ICD-10-CM | POA: Diagnosis not present

## 2017-11-07 DIAGNOSIS — R509 Fever, unspecified: Secondary | ICD-10-CM | POA: Diagnosis not present

## 2017-11-07 DIAGNOSIS — N186 End stage renal disease: Secondary | ICD-10-CM | POA: Diagnosis not present

## 2017-11-07 DIAGNOSIS — Z79899 Other long term (current) drug therapy: Secondary | ICD-10-CM | POA: Diagnosis not present

## 2017-11-10 ENCOUNTER — Ambulatory Visit (INDEPENDENT_AMBULATORY_CARE_PROVIDER_SITE_OTHER): Payer: BLUE CROSS/BLUE SHIELD | Admitting: Physician Assistant

## 2017-11-10 ENCOUNTER — Encounter (INDEPENDENT_AMBULATORY_CARE_PROVIDER_SITE_OTHER): Payer: Self-pay | Admitting: Orthopedic Surgery

## 2017-11-10 VITALS — Ht 75.0 in | Wt 221.0 lb

## 2017-11-10 DIAGNOSIS — D631 Anemia in chronic kidney disease: Secondary | ICD-10-CM | POA: Diagnosis not present

## 2017-11-10 DIAGNOSIS — Z89511 Acquired absence of right leg below knee: Secondary | ICD-10-CM

## 2017-11-10 DIAGNOSIS — N186 End stage renal disease: Secondary | ICD-10-CM | POA: Diagnosis not present

## 2017-11-10 DIAGNOSIS — D509 Iron deficiency anemia, unspecified: Secondary | ICD-10-CM | POA: Diagnosis not present

## 2017-11-10 DIAGNOSIS — R509 Fever, unspecified: Secondary | ICD-10-CM | POA: Diagnosis not present

## 2017-11-10 DIAGNOSIS — E1142 Type 2 diabetes mellitus with diabetic polyneuropathy: Secondary | ICD-10-CM

## 2017-11-10 DIAGNOSIS — S88111A Complete traumatic amputation at level between knee and ankle, right lower leg, initial encounter: Secondary | ICD-10-CM

## 2017-11-10 DIAGNOSIS — E440 Moderate protein-calorie malnutrition: Secondary | ICD-10-CM | POA: Diagnosis not present

## 2017-11-10 DIAGNOSIS — Z992 Dependence on renal dialysis: Secondary | ICD-10-CM

## 2017-11-10 DIAGNOSIS — E119 Type 2 diabetes mellitus without complications: Secondary | ICD-10-CM

## 2017-11-10 DIAGNOSIS — Z79899 Other long term (current) drug therapy: Secondary | ICD-10-CM | POA: Diagnosis not present

## 2017-11-10 NOTE — Progress Notes (Signed)
Office Visit Note   Patient: Lance [name]           Date of Birth: 1970/07/20           MRN: [id number] Visit Date: 11/10/2017              Requested by: [name], [address] [place], [place] [address] PCP: [name], MD  Chief Complaint  Patient presents with  . Right Leg - Follow-up      HPI: Patient is a 48 year old male who is status post a right below the knee amputation.  He has a history of type 2 diabetes, and end-stage renal disease on home hemodialysis.  Most recently he has been working with biotech to and padding to help with his socket.  He does feel like he is slightly tall on this side.  He is going to follow-up with them in the next 3 weeks for further adjustment of the prosthesis.  He reports no problems with calluses or blisters over the stump.  He has no pain in the stump.  Assessment & Plan: Visit Diagnoses:  1. Amputation of right lower extremity below knee ([place])   2. Diabetes mellitus type 2 in nonobese (HCC)   3. Diabetic polyneuropathy associated with type 2 diabetes mellitus ([place])   4. ESRD (end stage renal disease) on dialysis ([place])   5. Protein-calorie malnutrition, moderate ([place])     Plan: Continue to work with biotech on adjustments to his prosthesis.  Continue to monitor stump for any breakdown.  Continue to work on strengthening and progression of ambulation.  He will follow-up in 3 months or sooner should he have any difficulties in the interim.  Follow-Up Instructions: Return in about 3 months (around 02/09/2018).   Ortho Exam  Patient is alert, oriented, no adenopathy, well-dressed, normal affect, normal respiratory effort. Patient is ambulatory with pain right BKA amputation.  There are no signs of breakdown over the stump.  He has good knee extension and full flexion.  No signs of cellulitis.  Imaging: No results found. No images are attached to the encounter.  Labs: Lab Results  Component Value  Date   HGBA1C 6.7 (H) 07/25/2017   HGBA1C 9.1 (H) 02/18/2017   HGBA1C 9.8 (H) 11/28/2015   ESRSEDRATE 120 (H) 11/13/2015   CRP 35.4 (H) 11/13/2015   REPTSTATUS 12/06/2015 FINAL 11/30/2015   REPTSTATUS 11/30/2015 FINAL 11/30/2015   GRAMSTAIN  11/30/2015    WBC PRESENT,BOTH PMN AND MONONUCLEAR NO ORGANISMS SEEN CYTOSPIN SMEAR    CULT NO GROWTH 5 DAYS 11/30/2015   LABORGA STREPTOCOCCUS PNEUMONIAE 11/12/2015     Lab Results  Component Value Date   ALBUMIN 2.8 (L) 09/30/2017   ALBUMIN 2.7 (L) 09/29/2017   ALBUMIN 2.6 (L) 09/27/2017    Body mass index is 27.62 kg/m.  Orders:  No orders of the defined types were placed in this encounter.  No orders of the defined types were placed in this encounter.    Procedures: No procedures performed  Clinical Data: No additional findings.  ROS:  All other systems negative, except as noted in the HPI. Review of Systems  Objective: Vital Signs: Ht 6\' 3"  (1.905 m)   Wt 221 lb (100.2 kg)   BMI 27.62 kg/m   Specialty Comments:  No specialty comments available.  PMFS History: Patient Active Problem List   Diagnosis Date Noted  . Type 1 diabetes mellitus with other circulatory complications ([place]) 04/11/3141  . Cardiac  arrest ([place]) 09/24/2017  . Acute respiratory failure with hypoxia ([place]) 08/07/2017  . Respiratory distress 08/07/2017  . Dehiscence of amputation stump ([place]) 07/17/2017  . Benign essential HTN   . Chronic diastolic (congestive) heart failure ([place])   . Labile blood glucose   . Labile blood pressure   . Vomiting   . Phantom limb pain ([place])   . Poorly controlled type 2 diabetes mellitus with peripheral neuropathy ([place])   . Unilateral complete BKA, right, sequela ([place])   . Amputation of right lower extremity below knee ([place]) 06/10/2017  . ESRD on dialysis ([place])   . Chronic diastolic congestive heart failure ([place])   . History of PSVT (paroxysmal supraventricular tachycardia)   . Diabetes mellitus type 2 in  nonobese (HCC)   . Post-operative pain   . Acute blood loss anemia   . Anemia of chronic disease   . Leukocytosis   . Tachycardia   . History of right below knee amputation ([place]) 06/06/2017  . PVD (peripheral vascular disease) ([place]) 05/13/2017  . Bilateral pleural effusion 11/28/2015  . Loculated pleural effusion 11/28/2015  . HCAP (healthcare-associated pneumonia) 11/28/2015  . ESRD (end stage renal disease) on dialysis ([place]) 11/12/2015  . Hyperkalemia 11/12/2015  . Shoulder pain, acute 11/12/2015  . Nausea & vomiting 11/12/2015  . Diarrhea 11/12/2015  . Hypertensive urgency 05/05/2014  . Nephrotic syndrome 12/29/2013  . Hyponatremia 12/28/2013  . Acute diastolic heart failure ([place]) 12/28/2013  . Hypoglycemia 12/28/2013  . Dyspnea   . Acute renal failure syndrome ([place])   . Diabetic ketoacidosis without coma associated with type 1 diabetes mellitus ([place])   . Demand ischemia ([place]) 12/24/2013  . DKA (diabetic ketoacidoses) ([place]) 12/23/2013  . CAP (community acquired pneumonia) 12/23/2013  . Sepsis due to pneumonia ([place]) 12/23/2013  . Metabolic acidemia 67/20/9470  . Acute renal failure ([place]) 12/23/2013  . Diabetic ketoacidosis ([place]) 12/23/2013  . Chronic diastolic heart failure ([place]) 04/14/2013  . PSVT (paroxysmal supraventricular tachycardia) ([place]) 04/14/2013  . Bilateral leg edema 03/26/2013  . Chronic kidney disease, stage III (moderate) ([place]) 09/29/2011  . Chest pain 09/26/2010  . Type 1 diabetes mellitus with end-stage renal disease ([place]) 09/26/2010  . Essential hypertension, benign 09/26/2010  . Mixed hyperlipidemia 09/26/2010   Past Medical History:  Diagnosis Date  . Aortic stenosis    Very mild in 05/2007  . Arthritis    "right shoulder; right knee; feel like I've got it all over" (11/28/2015)  . CHF (congestive heart failure) ([place])   . Degenerative joint disease    Left TKA  . Diastolic heart failure (HCC)    LVEF 65-70% with grade 2 diastolic dysfunction  .  ESRD (end stage renal disease) on dialysis [facility])    (as of 09/24/2017) pt does HD at home on a M/Tu/Th/F schedule.  . Essential hypertension, benign 2000   LVH  . GERD (gastroesophageal reflux disease)   . HCAP (healthcare-associated pneumonia) 11/28/2015  . Hyperlipidemia 2000  . Loculated pleural effusion 11/28/2015   [name] 11/28/2015  . Pneumonia 12/2013; 05/2014  . PSVT (paroxysmal supraventricular tachycardia) (HCC)    Nonsustained; asymptomatic; diagnosed by event recorder in 2006  . Tobacco abuse, in remission    20 pack years; discontinued in 1985; bronchitic changes on chest x-ray and 2009  . Type 1 diabetes mellitus ([place]) 1990    Family History  Problem Relation Age of Onset  . Diabetes Father   . Hypertension Father   . Diabetes Mother   . Hypertension Mother  Past Surgical History:  Procedure Laterality Date  . AMPUTATION Right 06/06/2017   Procedure: RIGHT BELOW KNEE AMPUTATION;  Surgeon: [name], MD;  Location: [facility];  Service: Orthopedics;  Laterality: Right;  . APPLICATION OF WOUND VAC Right 07/25/2017   Procedure: APPLICATION OF WOUND VAC;  Surgeon: [name], MD;  Location: [facility];  Service: Orthopedics;  Laterality: Right;  . AV FISTULA PLACEMENT Right 06/23/2014   Procedure: ARTERIOVENOUS (AV) FISTULA CREATION;  Surgeon: Angelia Mould, MD;  Location: [facility];  Service: Vascular;  Laterality: Right;  . CARDIAC CATHETERIZATION     [facility]- evaluation for Kidney/ pancreas transplant  . EYE SURGERY Bilateral    "for glaucoma"  . INGUINAL HERNIA REPAIR Right    As a child  . IR DIALY SHUNT INTRO NEEDLE/INTRACATH INITIAL W/IMG RIGHT Right 09/29/2017  . LOWER EXTREMITY ANGIOGRAPHY N/A 05/27/2017   Procedure: LOWER EXTREMITY ANGIOGRAPHY;  Surgeon: [name], MD;  Location: [facility];  Service: Cardiovascular;  Laterality: N/A;  . STUMP REVISION Right 07/25/2017   Procedure: REVISION RIGHT BELOW KNEE AMPUTATION;;  Surgeon: [name],  MD;  Location: [facility];  Service: Orthopedics;  Laterality: Right;  . [place] EXTRACTION  1987   Social History   Occupational History  . Not on file  Tobacco Use  . Smoking status: Never Smoker  . Smokeless tobacco: Never Used  Substance and Sexual Activity  . Alcohol use: Yes    Comment: 11/28/2015 "used to drink; stopped ~ 2 yr ago"  . Drug use: No  . Sexual activity: Yes

## 2017-11-11 DIAGNOSIS — R2689 Other abnormalities of gait and mobility: Secondary | ICD-10-CM | POA: Diagnosis not present

## 2017-11-11 DIAGNOSIS — N186 End stage renal disease: Secondary | ICD-10-CM | POA: Diagnosis not present

## 2017-11-11 DIAGNOSIS — Z992 Dependence on renal dialysis: Secondary | ICD-10-CM | POA: Diagnosis not present

## 2017-11-11 DIAGNOSIS — E1022 Type 1 diabetes mellitus with diabetic chronic kidney disease: Secondary | ICD-10-CM | POA: Diagnosis not present

## 2017-11-11 DIAGNOSIS — N2589 Other disorders resulting from impaired renal tubular function: Secondary | ICD-10-CM | POA: Diagnosis not present

## 2017-11-11 DIAGNOSIS — D631 Anemia in chronic kidney disease: Secondary | ICD-10-CM | POA: Diagnosis not present

## 2017-11-11 DIAGNOSIS — R509 Fever, unspecified: Secondary | ICD-10-CM | POA: Diagnosis not present

## 2017-11-11 DIAGNOSIS — Z4931 Encounter for adequacy testing for hemodialysis: Secondary | ICD-10-CM | POA: Diagnosis not present

## 2017-11-11 DIAGNOSIS — R2681 Unsteadiness on feet: Secondary | ICD-10-CM | POA: Diagnosis not present

## 2017-11-11 DIAGNOSIS — R293 Abnormal posture: Secondary | ICD-10-CM | POA: Diagnosis not present

## 2017-11-11 DIAGNOSIS — M6281 Muscle weakness (generalized): Secondary | ICD-10-CM | POA: Diagnosis not present

## 2017-11-11 DIAGNOSIS — N2581 Secondary hyperparathyroidism of renal origin: Secondary | ICD-10-CM | POA: Diagnosis not present

## 2017-11-11 DIAGNOSIS — E8779 Other fluid overload: Secondary | ICD-10-CM | POA: Diagnosis not present

## 2017-11-11 DIAGNOSIS — K7689 Other specified diseases of liver: Secondary | ICD-10-CM | POA: Diagnosis not present

## 2017-11-12 ENCOUNTER — Ambulatory Visit: Payer: Medicare Other | Attending: Orthopedic Surgery | Admitting: Physical Therapy

## 2017-11-12 DIAGNOSIS — M6281 Muscle weakness (generalized): Secondary | ICD-10-CM | POA: Diagnosis not present

## 2017-11-12 DIAGNOSIS — R2681 Unsteadiness on feet: Secondary | ICD-10-CM | POA: Diagnosis not present

## 2017-11-12 DIAGNOSIS — R2689 Other abnormalities of gait and mobility: Secondary | ICD-10-CM | POA: Diagnosis not present

## 2017-11-12 DIAGNOSIS — R293 Abnormal posture: Secondary | ICD-10-CM | POA: Diagnosis not present

## 2017-11-12 NOTE — Patient Instructions (Signed)
Access Code: [email or web address]  URL: [email or web address]  Date: 11/12/2017  Prepared by: Bjorn Loser   Exercises Seated Hamstring Stretch - 3 reps - 1 sets - 30 hold - 1-2x daily - 5x weekly Supine Lower Trunk Rotation - 2-3 reps - 1 sets - 30 hold - 1-2x daily - 5x weekly

## 2017-11-12 NOTE — Therapy (Signed)
[facility] [address] [place], [place], [address] Phone: [phone]   Fax:  [phone]  Physical Therapy Treatment  Patient Details  Name: Lance [name] MRN: [id number] Date of Birth: 05-21-1970 Referring Provider (PT): [name], MD   Encounter Date: 11/12/2017  PT End of Session - 11/12/17 1245    Visit Number  7    Number of Visits  26    Date for PT Re-Evaluation  01/16/18    Authorization Type  Medicare & BCBS     Authorization Time Period  $3100 oop max has been met.  VL PT & OT combined 60 visits with zero met at PT eval    PT Start Time  0930    PT Stop Time  1010    PT Time Calculation (min)  40 min    Equipment Utilized During Treatment  Gait belt    Activity Tolerance  Patient tolerated treatment well;No increased pain    Behavior During Therapy  WFL for tasks assessed/performed       Past Medical History:  Diagnosis Date  . Aortic stenosis    Very mild in 05/2007  . Arthritis    "right shoulder; right knee; feel like I've got it all over" (11/28/2015)  . CHF (congestive heart failure) ([place])   . Degenerative joint disease    Left TKA  . Diastolic heart failure (HCC)    LVEF 65-70% with grade 2 diastolic dysfunction  . ESRD (end stage renal disease) on dialysis [facility])    (as of 09/24/2017) pt does HD at home on a M/Tu/Th/F schedule.  . Essential hypertension, benign 2000   LVH  . GERD (gastroesophageal reflux disease)   . HCAP (healthcare-associated pneumonia) 11/28/2015  . Hyperlipidemia 2000  . Loculated pleural effusion 11/28/2015   [name] 11/28/2015  . Pneumonia 12/2013; 05/2014  . PSVT (paroxysmal supraventricular tachycardia) (HCC)    Nonsustained; asymptomatic; diagnosed by event recorder in 2006  . Tobacco abuse, in remission    20 pack years; discontinued in 1985; bronchitic changes on chest x-ray and 2009  . Type 1 diabetes mellitus ([place]) 1990    Past Surgical History:   Procedure Laterality Date  . AMPUTATION Right 06/06/2017   Procedure: RIGHT BELOW KNEE AMPUTATION;  Surgeon: [name], MD;  Location: [facility];  Service: Orthopedics;  Laterality: Right;  . APPLICATION OF WOUND VAC Right 07/25/2017   Procedure: APPLICATION OF WOUND VAC;  Surgeon: [name], MD;  Location: [facility];  Service: Orthopedics;  Laterality: Right;  . AV FISTULA PLACEMENT Right 06/23/2014   Procedure: ARTERIOVENOUS (AV) FISTULA CREATION;  Surgeon: Angelia Mould, MD;  Location: [facility];  Service: Vascular;  Laterality: Right;  . CARDIAC CATHETERIZATION     [facility]- evaluation for Kidney/ pancreas transplant  . EYE SURGERY Bilateral    "for glaucoma"  . INGUINAL HERNIA REPAIR Right    As a child  . IR DIALY SHUNT INTRO NEEDLE/INTRACATH INITIAL W/IMG RIGHT Right 09/29/2017  . LOWER EXTREMITY ANGIOGRAPHY N/A 05/27/2017   Procedure: LOWER EXTREMITY ANGIOGRAPHY;  Surgeon: [name], MD;  Location: [facility];  Service: Cardiovascular;  Laterality: N/A;  . STUMP REVISION Right 07/25/2017   Procedure: REVISION RIGHT BELOW KNEE AMPUTATION;;  Surgeon: [name], MD;  Location: [facility];  Service: Orthopedics;  Laterality: Right;  . WISDOM TOOTH EXTRACTION  1987    There were no vitals filed for this visit.  Subjective Assessment - 11/12/17  0937    Subjective  Lifting weights and walking on treadmill and walking on driveway everyday.    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 He wants to be active with 2 children (19yo & 16yo) who play sports.                        [facility] Adult PT Treatment/Exercise - 11/12/17 0001      Transfers   Transfers  Sit to Stand;Stand to Sit    Sit to Stand  6: Modified  independent (Device/Increase time)    Stand to Sit  6: Modified independent (Device/Increase time)      Ambulation/Gait   Ambulation/Gait  Yes    Ambulation/Gait Assistance  5: Supervision    Ambulation/Gait Assistance Details  training with SPC on various outdoor surfaces then progressed without the cane.    Ambulation Distance (Feet)  500 Feet    Assistive device  Prosthesis;Straight cane;None    Gait Pattern  Step-through pattern;Decreased stride length;Decreased stance time - right;Decreased weight shift to right;Narrow base of support    Ambulation Surface  Outdoor;Paved;Gravel;Grass;Unlevel;Level;Indoor    Curb  5: Supervision    Curb Details (indicate cue type and reason)  with [facility]      Prosthetics   Prosthetic Care Comments   Weighed Pt with prosthesis on then off; difference = 2.8kg,  prosthesis weight for dialysis    Current prosthetic wear tolerance (days/week)   daily    Current prosthetic wear tolerance (#hours/day)   all awake hours with drying as needed    Residual limb condition   intact with no  issues. no openings today on incision.     Education Provided  Correct ply sock adjustment;Proper wear schedule/adjustment    Person(s) Educated  Patient    Education Method  Explanation    Education Method  Verbalized understanding    Donning Prosthesis  Modified independent (device/increased time)    Doffing Prosthesis  Modified independent (device/increased time)          Balance Exercises - 11/12/17 1234      Balance Exercises: Standing   Standing Eyes Opened  Narrow base of support (BOS);Solid surface;Foam/compliant surface   while holding weighted bal (arms extended)l worked on balance in stagger stance, feet together with  upper trunk rotations,  Then progress pt with feet together on compliant surface with various UE tasks with weighted ball.   Tandem Gait  Forward;Upper extremity support   used wall in hall way   Retro Gait  Foam/compliant surface     Sidestepping  Foam/compliant support   on gravel, mulch without AD       PT Education - 11/12/17 1244    Education Details  hamstring and lumbar (lower trunk rotation) stretch    Person(s) Educated  Patient    Methods  Explanation;Demonstration;Verbal cues;Handout    Comprehension  Verbalized understanding;Returned demonstration;Verbal cues required       PT Short Term Goals - 10/21/17 2300      PT SHORT TERM GOAL #1   Title  Patient tolerates prosthesis wear >12hrs total /day without skin issues. (All STGs Target Date: 11/19/2017)    Time  1    Period  Months    Status  New    Target Date  11/19/17      PT SHORT TERM GOAL #2   Title  Patient verbalizes proper adjusting ply socks with limb volume changes.     Time  1    Period  Months    Status  New    Target Date  11/19/17      PT SHORT TERM GOAL #3   Title  Patient ambulates 300' with cane & prosthesis with supervision.     Time  1    Period  Months    Status  New    Target Date  11/19/17      PT SHORT TERM GOAL #4   Title  Patient negotiates ramps, curbs & stairs single rail with cane with supervision.     Time  1    Period  Months    Status  New    Target Date  11/19/17      PT SHORT TERM GOAL #5   Title  Patient is able to pick up 5# from floor with supervision.     Time  1    Period  Months    Status  New    Target Date  11/19/17        PT Long Term Goals - 10/21/17 2252      PT LONG TERM GOAL #1   Title  Patient verbalizes & demonstrates understanding of prosthetic care to enable safe use of prosthesis. (All LTGs Target Date: 01/16/2018)    Time  3    Period  Months    Status  New    Target Date  01/16/18      PT LONG TERM GOAL #2   Title  Patient tolerates prosthesis wear >90% of awake hours without skin issues to enable function throughout the day.     Time  3    Period  Months    Status  New    Target Date  01/16/18      PT LONG TERM GOAL #3   Title  Berg Balance >/= 48/56 to indicate  lower fall risk.     Time  3    Period  Months    Status  New    Target Date  01/16/18      PT LONG TERM GOAL #4   Title  Functional Gait Assessment >19/30 with prosthesis only to indicate lower fall risk.     Time  3    Period  Months    Status  New    Target Date  01/16/18      PT LONG TERM GOAL #5   Title  Patient ambulates >500' with prosthesis only outdoors including grass, ramps, curbs & stairs modified independent.     Time  3    Period  Months    Status  New    Target Date  01/16/18            Plan - 11/12/17 1246    Clinical Impression Statement  Pt is progressing with balance during dynamic standing and gait on uneven surfaces without AD at supervision level.  At the end of session pt did report that RLE was fatigued and required UE suppport for tandem gait task.    Rehab Potential  Good    PT Frequency  2x / week    PT Duration  Other (comment)   13 weeks (90 days)   PT Treatment/Interventions  ADLs/Self Care  Home Management;Canalith Repostioning;DME Instruction;Gait training;Stair training;Functional mobility training;Therapeutic activities;Therapeutic exercise;Balance training;Neuromuscular re-education;Patient/family education;Prosthetic Training;Vestibular    PT Next Visit Plan  gait/barriers with cane. continue balance activities for decreased UE support. continue with prosthetic education, begin short distance gait with prosthesis only    Consulted and Agree with Plan of Care  Patient       Patient will benefit from skilled therapeutic intervention in order to improve the following deficits and impairments:  Abnormal gait, Decreased activity tolerance, Decreased balance, Decreased endurance, Decreased mobility, Decreased skin integrity, Decreased strength, Dizziness, Impaired flexibility, Postural dysfunction, Prosthetic Dependency  Visit Diagnosis: Unsteadiness on feet  Other abnormalities of gait and mobility     Problem List Patient Active Problem  List   Diagnosis Date Noted  . Type 1 diabetes mellitus with other circulatory complications ([place]) 69/62/9528  . Cardiac arrest ([place]) 09/24/2017  . Acute respiratory failure with hypoxia ([place]) 08/07/2017  . Respiratory distress 08/07/2017  . Dehiscence of amputation stump ([place]) 07/17/2017  . Benign essential HTN   . Chronic diastolic (congestive) heart failure ([place])   . Labile blood glucose   . Labile blood pressure   . Vomiting   . Phantom limb pain ([place])   . Poorly controlled type 2 diabetes mellitus with peripheral neuropathy ([place])   . Unilateral complete BKA, right, sequela ([place])   . Amputation of right lower extremity below knee ([place]) 06/10/2017  . ESRD on dialysis ([place])   . Chronic diastolic congestive heart failure ([place])   . History of PSVT (paroxysmal supraventricular tachycardia)   . Diabetes mellitus type 2 in nonobese (HCC)   . Post-operative pain   . Acute blood loss anemia   . Anemia of chronic disease   . Leukocytosis   . Tachycardia   . History of right below knee amputation ([place]) 06/06/2017  . PVD (peripheral vascular disease) ([place]) 05/13/2017  . Bilateral pleural effusion 11/28/2015  . Loculated pleural effusion 11/28/2015  . HCAP (healthcare-associated pneumonia) 11/28/2015  . ESRD (end stage renal disease) on dialysis ([place]) 11/12/2015  . Hyperkalemia 11/12/2015  . Shoulder pain, acute 11/12/2015  . Nausea & vomiting 11/12/2015  . Diarrhea 11/12/2015  . Hypertensive urgency 05/05/2014  . Nephrotic syndrome 12/29/2013  . Hyponatremia 12/28/2013  . Acute diastolic heart failure ([place]) 12/28/2013  . Hypoglycemia 12/28/2013  . Dyspnea   . Acute renal failure syndrome ([place])   . Diabetic ketoacidosis without coma associated with type 1 diabetes mellitus ([place])   . Demand ischemia ([place]) 12/24/2013  . DKA (diabetic ketoacidoses) ([place]) 12/23/2013  . CAP (community acquired pneumonia) 12/23/2013  . Sepsis due to pneumonia ([place]) 12/23/2013  . Metabolic acidemia  41/32/4401  . Acute renal failure ([place]) 12/23/2013  . Diabetic ketoacidosis ([place]) 12/23/2013  . Chronic diastolic heart failure ([place]) 04/14/2013  . PSVT (paroxysmal supraventricular tachycardia) ([place]) 04/14/2013  . Bilateral leg edema 03/26/2013  . Chronic kidney disease, stage III (moderate) ([place]) 09/29/2011  . Chest pain 09/26/2010  . Type 1 diabetes mellitus with end-stage renal disease ([place]) 09/26/2010  . Essential hypertension, benign 09/26/2010  . Mixed hyperlipidemia 09/26/2010    Bjorn Loser, PTA  11/12/17, 12:52 PM [facility] [address] [place], [place], [address] Phone: [phone]   Fax:  [phone]  Name: Lance [name] MRN: [id number] Date of Birth: [birth year]

## 2017-11-13 DIAGNOSIS — Z4931 Encounter for adequacy testing for hemodialysis: Secondary | ICD-10-CM | POA: Diagnosis not present

## 2017-11-13 DIAGNOSIS — K7689 Other specified diseases of liver: Secondary | ICD-10-CM | POA: Diagnosis not present

## 2017-11-13 DIAGNOSIS — N186 End stage renal disease: Secondary | ICD-10-CM | POA: Diagnosis not present

## 2017-11-13 DIAGNOSIS — N2581 Secondary hyperparathyroidism of renal origin: Secondary | ICD-10-CM | POA: Diagnosis not present

## 2017-11-13 DIAGNOSIS — D631 Anemia in chronic kidney disease: Secondary | ICD-10-CM | POA: Diagnosis not present

## 2017-11-13 DIAGNOSIS — R509 Fever, unspecified: Secondary | ICD-10-CM | POA: Diagnosis not present

## 2017-11-14 ENCOUNTER — Ambulatory Visit: Payer: Medicare Other | Admitting: Physical Therapy

## 2017-11-14 ENCOUNTER — Encounter: Payer: Self-pay | Admitting: Physical Therapy

## 2017-11-14 DIAGNOSIS — M6281 Muscle weakness (generalized): Secondary | ICD-10-CM | POA: Diagnosis not present

## 2017-11-14 DIAGNOSIS — R2689 Other abnormalities of gait and mobility: Secondary | ICD-10-CM

## 2017-11-14 DIAGNOSIS — R293 Abnormal posture: Secondary | ICD-10-CM

## 2017-11-14 DIAGNOSIS — R509 Fever, unspecified: Secondary | ICD-10-CM | POA: Diagnosis not present

## 2017-11-14 DIAGNOSIS — N2581 Secondary hyperparathyroidism of renal origin: Secondary | ICD-10-CM | POA: Diagnosis not present

## 2017-11-14 DIAGNOSIS — R2681 Unsteadiness on feet: Secondary | ICD-10-CM

## 2017-11-14 DIAGNOSIS — E1022 Type 1 diabetes mellitus with diabetic chronic kidney disease: Secondary | ICD-10-CM | POA: Diagnosis not present

## 2017-11-14 DIAGNOSIS — K7689 Other specified diseases of liver: Secondary | ICD-10-CM | POA: Diagnosis not present

## 2017-11-14 DIAGNOSIS — N186 End stage renal disease: Secondary | ICD-10-CM | POA: Diagnosis not present

## 2017-11-14 DIAGNOSIS — E7849 Other hyperlipidemia: Secondary | ICD-10-CM | POA: Diagnosis not present

## 2017-11-14 DIAGNOSIS — Z4931 Encounter for adequacy testing for hemodialysis: Secondary | ICD-10-CM | POA: Diagnosis not present

## 2017-11-14 DIAGNOSIS — D631 Anemia in chronic kidney disease: Secondary | ICD-10-CM | POA: Diagnosis not present

## 2017-11-14 NOTE — Therapy (Signed)
[facility] [address] [place], [place], [address] Phone: [phone]   Fax:  [phone]  Physical Therapy Treatment  Patient Details  Name: Lance [name] MRN: [id number] Date of Birth: 1970-12-29 Referring Provider (PT): [name], MD   Encounter Date: 11/14/2017  PT End of Session - 11/14/17 0937    Visit Number  8    Number of Visits  26    Date for PT Re-Evaluation  01/16/18    Authorization Type  Medicare & BCBS     Authorization Time Period  $3100 oop max has been met.  VL PT & OT combined 60 visits with zero met at PT eval    PT Start Time  0934    PT Stop Time  1014    PT Time Calculation (min)  40 min    Equipment Utilized During Treatment  Gait belt    Activity Tolerance  Patient tolerated treatment well;No increased pain    Behavior During Therapy  WFL for tasks assessed/performed       Past Medical History:  Diagnosis Date  . Aortic stenosis    Very mild in 05/2007  . Arthritis    "right shoulder; right knee; feel like I've got it all over" (11/28/2015)  . CHF (congestive heart failure) ([place])   . Degenerative joint disease    Left TKA  . Diastolic heart failure (HCC)    LVEF 65-70% with grade 2 diastolic dysfunction  . ESRD (end stage renal disease) on dialysis [facility])    (as of 09/24/2017) pt does HD at home on a M/Tu/Th/F schedule.  . Essential hypertension, benign 2000   LVH  . GERD (gastroesophageal reflux disease)   . HCAP (healthcare-associated pneumonia) 11/28/2015  . Hyperlipidemia 2000  . Loculated pleural effusion 11/28/2015   [name] 11/28/2015  . Pneumonia 12/2013; 05/2014  . PSVT (paroxysmal supraventricular tachycardia) (HCC)    Nonsustained; asymptomatic; diagnosed by event recorder in 2006  . Tobacco abuse, in remission    20 pack years; discontinued in 1985; bronchitic changes on chest x-ray and 2009  . Type 1 diabetes mellitus ([place]) 1990    Past Surgical History:   Procedure Laterality Date  . AMPUTATION Right 06/06/2017   Procedure: RIGHT BELOW KNEE AMPUTATION;  Surgeon: [name], MD;  Location: [facility];  Service: Orthopedics;  Laterality: Right;  . APPLICATION OF WOUND VAC Right 07/25/2017   Procedure: APPLICATION OF WOUND VAC;  Surgeon: [name], MD;  Location: [facility];  Service: Orthopedics;  Laterality: Right;  . AV FISTULA PLACEMENT Right 06/23/2014   Procedure: ARTERIOVENOUS (AV) FISTULA CREATION;  Surgeon: Angelia Mould, MD;  Location: [facility];  Service: Vascular;  Laterality: Right;  . CARDIAC CATHETERIZATION     [facility]- evaluation for Kidney/ pancreas transplant  . EYE SURGERY Bilateral    "for glaucoma"  . INGUINAL HERNIA REPAIR Right    As a child  . IR DIALY SHUNT INTRO NEEDLE/INTRACATH INITIAL W/IMG RIGHT Right 09/29/2017  . LOWER EXTREMITY ANGIOGRAPHY N/A 05/27/2017   Procedure: LOWER EXTREMITY ANGIOGRAPHY;  Surgeon: [name], MD;  Location: [facility];  Service: Cardiovascular;  Laterality: N/A;  . STUMP REVISION Right 07/25/2017   Procedure: REVISION RIGHT BELOW KNEE AMPUTATION;;  Surgeon: [name], MD;  Location: [facility];  Service: Orthopedics;  Laterality: Right;  . WISDOM TOOTH EXTRACTION  1987    There were no vitals filed for this visit.  Subjective Assessment - 11/14/17  2542    Subjective  No new complaints. No falls or pain. Reports having the correct prosthetic weight has helped with night time dialysis.     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 He wants to be active with 2 children (19yo & 16yo) who play sports.     Currently in Pain?  No/denies            [facility] Adult PT Treatment/Exercise - 11/14/17 0938      Transfers   Transfers   Stand to Sit;Sit to Stand    Sit to Stand  6: Modified independent (Device/Increase time)    Stand to Sit  6: Modified independent (Device/Increase time)      Ambulation/Gait   Ambulation/Gait  Yes    Ambulation/Gait Assistance  4: Min guard;5: Supervision    Ambulation/Gait Assistance Details  worked with prosthesis only this session. cues with gait on posture, step length for more equal step length, and for weight shifting. cues to narrow base of support as well with gait.     Ambulation Distance (Feet)  230 Feet   x1, plus around gym   Assistive device  Prosthesis;Straight cane;None    Gait Pattern  Step-through pattern;Decreased stride length;Decreased stance time - right;Decreased weight shift to right;Narrow base of support    Ambulation Surface  Level;Indoor      High Level Balance   High Level Balance Activities  Marching forwards;Marching backwards;Tandem walking;Side stepping    High Level Balance Comments  on both red mats next to counter with no UE support: 3 laps each with min guard to min assist for balance, cues on posture, ex form/technique.        Therapeutic Activites    Therapeutic Activities  Lifting    Lifting  20# crate on floor: lifting from floor, carrying for 15 feet to set on waist high mat, then lifting from mat and carrying 15 feet to place back on floor. performed this for 5 reps with min guard to supervision assist. cues on lifting technique with first 2 reps, then no cues needed.       Neuro Re-ed    Neuro Re-ed Details   for balance/coordination: gait with no AD along ~50 foot hallway:  fwd gait with head movements left<>fwd<>right, then up<>fwd<>down x 4 laps each with min assist downgrading to min guard assist. veering noted with one episode of prosthesis crossing over other foot needing assist for balance correction. decr gait speed noted as well with head movements.       Prosthetics   Current prosthetic wear tolerance (days/week)   daily    Current  prosthetic wear tolerance (#hours/day)   all awake hours with drying as needed    Residual limb condition   intact per pt report    Education Provided  Residual limb care;Proper weight-bearing schedule/adjustment    Person(s) Educated  Patient    Education Method  Explanation;Demonstration;Verbal cues    Education Method  Verbalized understanding;Returned demonstration;Verbal cues required;Needs further instruction    Donning Prosthesis  Modified independent (device/increased time)    Doffing Prosthesis  Modified independent (device/increased time)          Balance Exercises - 11/14/17 1001  Balance Exercises: Standing   Rockerboard  Anterior/posterior;Lateral;Head turns;EO;EC;30 seconds;10 reps      Balance Exercises: Standing   Rebounder Limitations  performed both ways on balance board: holding board steady for EC no head movements, progressing to EC head movements left<>right, up<>down with min guard to min assist for balance. cues on posture and weight shifitng to assist with balance control.          PT Short Term Goals - 10/21/17 2300      PT SHORT TERM GOAL #1   Title  Patient tolerates prosthesis wear >12hrs total /day without skin issues. (All STGs Target Date: 11/19/2017)    Time  1    Period  Months    Status  New    Target Date  11/19/17      PT SHORT TERM GOAL #2   Title  Patient verbalizes proper adjusting ply socks with limb volume changes.     Time  1    Period  Months    Status  New    Target Date  11/19/17      PT SHORT TERM GOAL #3   Title  Patient ambulates 300' with cane & prosthesis with supervision.     Time  1    Period  Months    Status  New    Target Date  11/19/17      PT SHORT TERM GOAL #4   Title  Patient negotiates ramps, curbs & stairs single rail with cane with supervision.     Time  1    Period  Months    Status  New    Target Date  11/19/17      PT SHORT TERM GOAL #5   Title  Patient is able to pick up 5# from floor with  supervision.     Time  1    Period  Months    Status  New    Target Date  11/19/17        PT Long Term Goals - 10/21/17 2252      PT LONG TERM GOAL #1   Title  Patient verbalizes & demonstrates understanding of prosthetic care to enable safe use of prosthesis. (All LTGs Target Date: 01/16/2018)    Time  3    Period  Months    Status  New    Target Date  01/16/18      PT LONG TERM GOAL #2   Title  Patient tolerates prosthesis wear >90% of awake hours without skin issues to enable function throughout the day.     Time  3    Period  Months    Status  New    Target Date  01/16/18      PT LONG TERM GOAL #3   Title  Berg Balance >/= 48/56 to indicate lower fall risk.     Time  3    Period  Months    Status  New    Target Date  01/16/18      PT LONG TERM GOAL #4   Title  Functional Gait Assessment >19/30 with prosthesis only to indicate lower fall risk.     Time  3    Period  Months    Status  New    Target Date  01/16/18      PT LONG TERM GOAL #5   Title  Patient ambulates >500' with prosthesis only outdoors including grass, ramps, curbs & stairs modified independent.  Time  3    Period  Months    Status  New    Target Date  01/16/18            Plan - 11/14/17 3536    Clinical Impression Statement  Today's skilled session continued to focus on gait and balance with prosthesis only. Pt is progressing very well toward goals and should benefit from continued PT to progress toward unmet goals.     Rehab Potential  Good    PT Frequency  2x / week    PT Duration  Other (comment)   13 weeks (90 days)   PT Treatment/Interventions  ADLs/Self Care Home Management;Canalith Repostioning;DME Instruction;Gait training;Stair training;Functional mobility training;Therapeutic activities;Therapeutic exercise;Balance training;Neuromuscular re-education;Patient/family education;Prosthetic Training;Vestibular    PT Next Visit Plan  continue to work on gait and balance with  prosthesis only toward; STGs due next week.    Consulted and Agree with Plan of Care  Patient       Patient will benefit from skilled therapeutic intervention in order to improve the following deficits and impairments:  Abnormal gait, Decreased activity tolerance, Decreased balance, Decreased endurance, Decreased mobility, Decreased skin integrity, Decreased strength, Dizziness, Impaired flexibility, Postural dysfunction, Prosthetic Dependency  Visit Diagnosis: Unsteadiness on feet  Other abnormalities of gait and mobility  Abnormal posture  Muscle weakness (generalized)     Problem List Patient Active Problem List   Diagnosis Date Noted  . Type 1 diabetes mellitus with other circulatory complications ([place]) 14/43/1540  . Cardiac arrest ([place]) 09/24/2017  . Acute respiratory failure with hypoxia ([place]) 08/07/2017  . Respiratory distress 08/07/2017  . Dehiscence of amputation stump ([place]) 07/17/2017  . Benign essential HTN   . Chronic diastolic (congestive) heart failure ([place])   . Labile blood glucose   . Labile blood pressure   . Vomiting   . Phantom limb pain ([place])   . Poorly controlled type 2 diabetes mellitus with peripheral neuropathy ([place])   . Unilateral complete BKA, right, sequela ([place])   . Amputation of right lower extremity below knee ([place]) 06/10/2017  . ESRD on dialysis ([place])   . Chronic diastolic congestive heart failure ([place])   . History of PSVT (paroxysmal supraventricular tachycardia)   . Diabetes mellitus type 2 in nonobese (HCC)   . Post-operative pain   . Acute blood loss anemia   . Anemia of chronic disease   . Leukocytosis   . Tachycardia   . History of right below knee amputation ([place]) 06/06/2017  . PVD (peripheral vascular disease) ([place]) 05/13/2017  . Bilateral pleural effusion 11/28/2015  . Loculated pleural effusion 11/28/2015  . HCAP (healthcare-associated pneumonia) 11/28/2015  . ESRD (end stage renal disease) on dialysis ([place]) 11/12/2015  .  Hyperkalemia 11/12/2015  . Shoulder pain, acute 11/12/2015  . Nausea & vomiting 11/12/2015  . Diarrhea 11/12/2015  . Hypertensive urgency 05/05/2014  . Nephrotic syndrome 12/29/2013  . Hyponatremia 12/28/2013  . Acute diastolic heart failure ([place]) 12/28/2013  . Hypoglycemia 12/28/2013  . Dyspnea   . Acute renal failure syndrome ([place])   . Diabetic ketoacidosis without coma associated with type 1 diabetes mellitus ([place])   . Demand ischemia ([place]) 12/24/2013  . DKA (diabetic ketoacidoses) ([place]) 12/23/2013  . CAP (community acquired pneumonia) 12/23/2013  . Sepsis due to pneumonia ([place]) 12/23/2013  . Metabolic acidemia 08/67/6195  . Acute renal failure ([place]) 12/23/2013  . Diabetic ketoacidosis ([place]) 12/23/2013  . Chronic diastolic heart failure ([place]) 04/14/2013  . PSVT (paroxysmal supraventricular tachycardia) ([place]) 04/14/2013  .  Bilateral leg edema 03/26/2013  . Chronic kidney disease, stage III (moderate) ([place]) 09/29/2011  . Chest pain 09/26/2010  . Type 1 diabetes mellitus with end-stage renal disease ([place]) 09/26/2010  . Essential hypertension, benign 09/26/2010  . Mixed hyperlipidemia 09/26/2010    Willow Ora, PTA, [facility] [address], [place], [place] [address] [phone] 11/14/17, 1:59 PM   Name: Lance [name] MRN: [id number] Date of Birth: 10-24-1970

## 2017-11-17 ENCOUNTER — Ambulatory Visit: Payer: Medicare Other | Admitting: Physical Therapy

## 2017-11-17 ENCOUNTER — Encounter: Payer: Self-pay | Admitting: Physical Therapy

## 2017-11-17 DIAGNOSIS — M6281 Muscle weakness (generalized): Secondary | ICD-10-CM

## 2017-11-17 DIAGNOSIS — R2681 Unsteadiness on feet: Secondary | ICD-10-CM

## 2017-11-17 DIAGNOSIS — Z4931 Encounter for adequacy testing for hemodialysis: Secondary | ICD-10-CM | POA: Diagnosis not present

## 2017-11-17 DIAGNOSIS — N186 End stage renal disease: Secondary | ICD-10-CM | POA: Diagnosis not present

## 2017-11-17 DIAGNOSIS — R2689 Other abnormalities of gait and mobility: Secondary | ICD-10-CM

## 2017-11-17 DIAGNOSIS — R293 Abnormal posture: Secondary | ICD-10-CM | POA: Diagnosis not present

## 2017-11-17 DIAGNOSIS — D631 Anemia in chronic kidney disease: Secondary | ICD-10-CM | POA: Diagnosis not present

## 2017-11-17 DIAGNOSIS — R509 Fever, unspecified: Secondary | ICD-10-CM | POA: Diagnosis not present

## 2017-11-17 DIAGNOSIS — K7689 Other specified diseases of liver: Secondary | ICD-10-CM | POA: Diagnosis not present

## 2017-11-17 DIAGNOSIS — N2581 Secondary hyperparathyroidism of renal origin: Secondary | ICD-10-CM | POA: Diagnosis not present

## 2017-11-17 NOTE — Therapy (Signed)
[facility] [address] [place], [place], [address] Phone: [phone]   Fax:  [phone]  Physical Therapy Treatment  Patient Details  Name: Lance [name] MRN: [id number] Date of Birth: Jan 16, 1971 Referring Provider (PT): [name], MD   Encounter Date: 11/17/2017  PT End of Session - 11/17/17 0849    Visit Number  9    Number of Visits  26    Date for PT Re-Evaluation  01/16/18    Authorization Type  Medicare & BCBS     Authorization Time Period  $3100 oop max has been met.  VL PT & OT combined 60 visits with zero met at PT eval    PT Start Time  0847    PT Stop Time  0928    PT Time Calculation (min)  41 min    Equipment Utilized During Treatment  Gait belt    Activity Tolerance  Patient tolerated treatment well;No increased pain    Behavior During Therapy  WFL for tasks assessed/performed       Past Medical History:  Diagnosis Date  . Aortic stenosis    Very mild in 05/2007  . Arthritis    "right shoulder; right knee; feel like I've got it all over" (11/28/2015)  . CHF (congestive heart failure) ([place])   . Degenerative joint disease    Left TKA  . Diastolic heart failure (HCC)    LVEF 65-70% with grade 2 diastolic dysfunction  . ESRD (end stage renal disease) on dialysis [facility])    (as of 09/24/2017) pt does HD at home on a M/Tu/Th/F schedule.  . Essential hypertension, benign 2000   LVH  . GERD (gastroesophageal reflux disease)   . HCAP (healthcare-associated pneumonia) 11/28/2015  . Hyperlipidemia 2000  . Loculated pleural effusion 11/28/2015   [name] 11/28/2015  . Pneumonia 12/2013; 05/2014  . PSVT (paroxysmal supraventricular tachycardia) (HCC)    Nonsustained; asymptomatic; diagnosed by event recorder in 2006  . Tobacco abuse, in remission    20 pack years; discontinued in 1985; bronchitic changes on chest x-ray and 2009  . Type 1 diabetes mellitus ([place]) 1990    Past Surgical History:   Procedure Laterality Date  . AMPUTATION Right 06/06/2017   Procedure: RIGHT BELOW KNEE AMPUTATION;  Surgeon: [name], MD;  Location: [facility];  Service: Orthopedics;  Laterality: Right;  . APPLICATION OF WOUND VAC Right 07/25/2017   Procedure: APPLICATION OF WOUND VAC;  Surgeon: [name], MD;  Location: [facility];  Service: Orthopedics;  Laterality: Right;  . AV FISTULA PLACEMENT Right 06/23/2014   Procedure: ARTERIOVENOUS (AV) FISTULA CREATION;  Surgeon: Angelia Mould, MD;  Location: [facility];  Service: Vascular;  Laterality: Right;  . CARDIAC CATHETERIZATION     [facility]- evaluation for Kidney/ pancreas transplant  . EYE SURGERY Bilateral    "for glaucoma"  . INGUINAL HERNIA REPAIR Right    As a child  . IR DIALY SHUNT INTRO NEEDLE/INTRACATH INITIAL W/IMG RIGHT Right 09/29/2017  . LOWER EXTREMITY ANGIOGRAPHY N/A 05/27/2017   Procedure: LOWER EXTREMITY ANGIOGRAPHY;  Surgeon: [name], MD;  Location: [facility];  Service: Cardiovascular;  Laterality: N/A;  . STUMP REVISION Right 07/25/2017   Procedure: REVISION RIGHT BELOW KNEE AMPUTATION;;  Surgeon: [name], MD;  Location: [facility];  Service: Orthopedics;  Laterality: Right;  . WISDOM TOOTH EXTRACTION  1987    There were no vitals filed for this visit.  Subjective Assessment - 11/17/17  2751    Subjective  No new complaints. No falls or pain to report.     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 He wants to be active with 2 children (19yo & 16yo) who play sports.          [facility] Adult PT Treatment/Exercise - 11/17/17 0851      Transfers   Transfers  Stand to Sit;Sit to Stand    Sit to Stand  6: Modified independent (Device/Increase time)    Stand to Sit  6:  Modified independent (Device/Increase time)      Ambulation/Gait   Ambulation/Gait  Yes    Ambulation/Gait Assistance  6: Modified independent (Device/Increase time)    Ambulation/Gait Assistance Details  used cane to check STG for gait then resumed gait training with prosthesis only. supervision with cues on posture and equal step length.     Ambulation Distance (Feet)  325 Feet   x1 with cane; remainder without AD around gym   Assistive device  Prosthesis;Straight cane;None    Gait Pattern  Step-through pattern;Decreased stride length;Decreased stance time - right;Decreased weight shift to right;Narrow base of support    Ambulation Surface  Level;Indoor    Stairs  Yes    Stairs Assistance  6: Modified independent (Device/Increase time)    Stairs Assistance Details (indicate cue type and reason)  Mod I with cane/rail using reciprocal pattern.     Stair Management Technique  One rail Right;Alternating pattern;Forwards;With cane    Number of Stairs  4    Height of Stairs  6    Ramp  6: Modified independent (Device)    Ramp Details (indicate cue type and reason)  mod I with cane/prosthesis    Curb  6: Modified independent (Device/increase time)    Curb Details (indicate cue type and reason)  Mod I with cane/prosthesis      High Level Balance   High Level Balance Activities  Marching forwards;Marching backwards;Tandem walking;Side stepping;Braiding   side stepping in squat position   High Level Balance Comments  on red/blue mat combo next to counter top: 3 laps each with no UE support, min guard assist with cues on posture, ex form and technique.       Neuro Re-ed    Neuro Re-ed Details   for balance/coordination: gait around track/gym with prosthesis only working on enviromental scanning, speed changes, direction changes, sudden stops/starts with min guard assist for safety. no balance loss noted with activities. pt did demo slight antalgic pattern as activity progressed needing a rest  break to allow LE to rest so not to promote poor gait patterns.           Balance Exercises - 11/17/17 0909      Balance Exercises: Standing   Rockerboard  Anterior/posterior;Lateral;Head turns;EO;EC;30 seconds;10 reps      Balance Exercises: Standing   Rebounder Limitations  performed both ways on balance board: holding the board steady for EC no head movements, progressing to EC head movements left<>right, then up<>down. min guard to min assist for balance. cues on posture and weight shifting to assist with balance recovery.           PT Short Term Goals - 11/17/17 7001  PT SHORT TERM GOAL #1   Title  Patient tolerates prosthesis wear >12hrs total /day without skin issues. (All STGs Target Date: 11/19/2017)    Baseline  11/17/17: goal met    Status  Achieved      PT SHORT TERM GOAL #2   Title  Patient verbalizes proper adjusting ply socks with limb volume changes.     Baseline  11/17/17: goal met    Status  Achieved      PT SHORT TERM GOAL #3   Title  Patient ambulates 300' with cane & prosthesis with supervision.     Baseline  11/17/17: met today    Time  --    Period  --    Status  Achieved      PT SHORT TERM GOAL #4   Title  Patient negotiates ramps, curbs & stairs single rail with cane with supervision.     Baseline  11/17/17: met today    Time  --    Period  --    Status  Achieved      PT SHORT TERM GOAL #5   Title  Patient is able to pick up 5# from floor with supervision.     Baseline  11/14/17: met today with 20# crate    Time  --    Period  --    Status  Achieved        PT Long Term Goals - 10/21/17 2252      PT LONG TERM GOAL #1   Title  Patient verbalizes & demonstrates understanding of prosthetic care to enable safe use of prosthesis. (All LTGs Target Date: 01/16/2018)    Time  3    Period  Months    Status  New    Target Date  01/16/18      PT LONG TERM GOAL #2   Title  Patient tolerates prosthesis wear >90% of awake hours without skin  issues to enable function throughout the day.     Time  3    Period  Months    Status  New    Target Date  01/16/18      PT LONG TERM GOAL #3   Title  Berg Balance >/= 48/56 to indicate lower fall risk.     Time  3    Period  Months    Status  New    Target Date  01/16/18      PT LONG TERM GOAL #4   Title  Functional Gait Assessment >19/30 with prosthesis only to indicate lower fall risk.     Time  3    Period  Months    Status  New    Target Date  01/16/18      PT LONG TERM GOAL #5   Title  Patient ambulates >500' with prosthesis only outdoors including grass, ramps, curbs & stairs modified independent.     Time  3    Period  Months    Status  New    Target Date  01/16/18            Plan - 11/17/17 0850    Clinical Impression Statement  Today's skilled session initially focused on STGs with all goals met today. He is currently Mod I with straight cane for gait/barriers and is progressing to prosthesis only. The pt is making steady progress and should benefit from continued PT to progress toward unmet goals.     Rehab Potential  Good    PT  Frequency  2x / week    PT Duration  Other (comment)   13 weeks (90 days)   PT Treatment/Interventions  ADLs/Self Care Home Management;Canalith Repostioning;DME Instruction;Gait training;Stair training;Functional mobility training;Therapeutic activities;Therapeutic exercise;Balance training;Neuromuscular re-education;Patient/family education;Prosthetic Training;Vestibular    PT Next Visit Plan  10th visit progress note due next visit; continue to work on gait outdoors with prothesis only, high level balance, work on simulated work activities- climbing laddres, 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 Type 1 diabetes mellitus with other circulatory complications ([place]) 91/50/5697  . Cardiac arrest ([place]) 09/24/2017  . Acute respiratory failure with hypoxia ([place]) 08/07/2017  . Respiratory distress 08/07/2017  . Dehiscence of amputation stump ([place]) 07/17/2017  . Benign essential HTN   . Chronic diastolic (congestive) heart failure ([place])   . Labile blood glucose   . Labile blood pressure   . Vomiting   . Phantom limb pain ([place])   . Poorly controlled type 2 diabetes mellitus with peripheral neuropathy ([place])   . Unilateral complete BKA, right, sequela ([place])   . Amputation of right lower extremity below knee ([place]) 06/10/2017  . ESRD on dialysis ([place])   . Chronic diastolic congestive heart failure ([place])   . History of PSVT (paroxysmal supraventricular tachycardia)   . Diabetes mellitus type 2 in nonobese (HCC)   . Post-operative pain   . Acute blood loss anemia   . Anemia of chronic disease   . Leukocytosis   . Tachycardia   . History of right below knee amputation ([place]) 06/06/2017  . PVD (peripheral vascular disease) ([place]) 05/13/2017  . Bilateral pleural effusion 11/28/2015  . Loculated pleural effusion 11/28/2015  . HCAP (healthcare-associated pneumonia) 11/28/2015  . ESRD (end stage renal disease) on dialysis ([place]) 11/12/2015  . Hyperkalemia 11/12/2015  . Shoulder pain, acute 11/12/2015  . Nausea & vomiting 11/12/2015  . Diarrhea 11/12/2015  . Hypertensive urgency 05/05/2014  . Nephrotic syndrome 12/29/2013  . Hyponatremia 12/28/2013  . Acute diastolic heart failure ([place]) 12/28/2013  .  Hypoglycemia 12/28/2013  . Dyspnea   . Acute renal failure syndrome ([place])   . Diabetic ketoacidosis without coma associated with type 1 diabetes mellitus ([place])   . Demand ischemia ([place]) 12/24/2013  . DKA (diabetic ketoacidoses) ([place]) 12/23/2013  . CAP (community acquired pneumonia) 12/23/2013  . Sepsis due to pneumonia ([place]) 12/23/2013  . Metabolic acidemia 94/80/1655  . Acute renal failure ([place]) 12/23/2013  . Diabetic ketoacidosis ([place]) 12/23/2013  . Chronic diastolic heart failure ([place]) 04/14/2013  . PSVT (paroxysmal supraventricular tachycardia) ([place]) 04/14/2013  . Bilateral leg edema 03/26/2013  . Chronic kidney disease, stage III (moderate) ([place]) 09/29/2011  . Chest pain 09/26/2010  . Type 1 diabetes mellitus with end-stage renal disease ([place]) 09/26/2010  . Essential hypertension, benign 09/26/2010  . Mixed hyperlipidemia 09/26/2010    Willow Ora, PTA, [facility] [address] [place], [place] [address] [phone] 11/17/17, 7:37 PM   Name: Lance  SKYLEN [name] MRN: [id number] Date of Birth: 1970-11-14

## 2017-11-18 ENCOUNTER — Encounter: Payer: Self-pay | Admitting: Physical Therapy

## 2017-11-18 ENCOUNTER — Ambulatory Visit: Payer: Medicare Other | Admitting: Physical Therapy

## 2017-11-18 DIAGNOSIS — M6281 Muscle weakness (generalized): Secondary | ICD-10-CM | POA: Diagnosis not present

## 2017-11-18 DIAGNOSIS — N186 End stage renal disease: Secondary | ICD-10-CM | POA: Diagnosis not present

## 2017-11-18 DIAGNOSIS — K7689 Other specified diseases of liver: Secondary | ICD-10-CM | POA: Diagnosis not present

## 2017-11-18 DIAGNOSIS — R2681 Unsteadiness on feet: Secondary | ICD-10-CM | POA: Diagnosis not present

## 2017-11-18 DIAGNOSIS — R2689 Other abnormalities of gait and mobility: Secondary | ICD-10-CM | POA: Diagnosis not present

## 2017-11-18 DIAGNOSIS — R293 Abnormal posture: Secondary | ICD-10-CM | POA: Diagnosis not present

## 2017-11-18 DIAGNOSIS — Z4931 Encounter for adequacy testing for hemodialysis: Secondary | ICD-10-CM | POA: Diagnosis not present

## 2017-11-18 DIAGNOSIS — R509 Fever, unspecified: Secondary | ICD-10-CM | POA: Diagnosis not present

## 2017-11-18 DIAGNOSIS — N2581 Secondary hyperparathyroidism of renal origin: Secondary | ICD-10-CM | POA: Diagnosis not present

## 2017-11-18 DIAGNOSIS — D631 Anemia in chronic kidney disease: Secondary | ICD-10-CM | POA: Diagnosis not present

## 2017-11-19 DIAGNOSIS — R509 Fever, unspecified: Secondary | ICD-10-CM | POA: Diagnosis not present

## 2017-11-19 DIAGNOSIS — D631 Anemia in chronic kidney disease: Secondary | ICD-10-CM | POA: Diagnosis not present

## 2017-11-19 DIAGNOSIS — K7689 Other specified diseases of liver: Secondary | ICD-10-CM | POA: Diagnosis not present

## 2017-11-19 DIAGNOSIS — N186 End stage renal disease: Secondary | ICD-10-CM | POA: Diagnosis not present

## 2017-11-19 DIAGNOSIS — N2581 Secondary hyperparathyroidism of renal origin: Secondary | ICD-10-CM | POA: Diagnosis not present

## 2017-11-19 DIAGNOSIS — Z4931 Encounter for adequacy testing for hemodialysis: Secondary | ICD-10-CM | POA: Diagnosis not present

## 2017-11-19 NOTE — Therapy (Addendum)
[facility] [address] [place], [place], [address] Phone: [phone]   Fax:  [phone]   Progress Note Reporting Period  to 10/20/2017 to 108/2019  See note below for Objective Data and Assessment of Progress/Goals. Jamey Reas, PT, DPT PT Specializing in [place] 11/20/17 8:15 AM Phone:  [phone]  Fax:  [phone] [facility] [address] [place], [place] [address]   Physical Therapy Treatment  Patient Details  Name: Lance [name] MRN: [id number] Date of Birth: 09-01-1970 Referring Provider (PT): [name], MD   Encounter Date: 11/18/2017  PT End of Session - 11/18/17 0937    Visit Number  10    Number of Visits  26    Date for PT Re-Evaluation  01/16/18    Authorization Type  Medicare & BCBS     Authorization Time Period  $3100 oop max has been met.  VL PT & OT combined 60 visits with zero met at PT eval    PT Start Time  0934    PT Stop Time  1015    PT Time Calculation (min)  41 min    Equipment Utilized During Treatment  Gait belt    Activity Tolerance  Patient tolerated treatment well;No increased pain    Behavior During Therapy  WFL for tasks assessed/performed       Past Medical History:  Diagnosis Date  . Aortic stenosis    Very mild in 05/2007  . Arthritis    "right shoulder; right knee; feel like I've got it all over" (11/28/2015)  . CHF (congestive heart failure) ([place])   . Degenerative joint disease    Left TKA  . Diastolic heart failure (HCC)    LVEF 65-70% with grade 2 diastolic dysfunction  . ESRD (end stage renal disease) on dialysis [facility])    (as of 09/24/2017) pt does HD at home on a M/Tu/Th/F schedule.  . Essential hypertension, benign 2000   LVH  . GERD (gastroesophageal reflux disease)   . HCAP (healthcare-associated pneumonia) 11/28/2015  . Hyperlipidemia 2000  . Loculated pleural effusion 11/28/2015   [name] 11/28/2015   . Pneumonia 12/2013; 05/2014  . PSVT (paroxysmal supraventricular tachycardia) (HCC)    Nonsustained; asymptomatic; diagnosed by event recorder in 2006  . Tobacco abuse, in remission    20 pack years; discontinued in 1985; bronchitic changes on chest x-ray and 2009  . Type 1 diabetes mellitus ([facility]) 1990    Past Surgical History:  Procedure Laterality Date  . AMPUTATION Right 06/06/2017   Procedure: RIGHT BELOW KNEE AMPUTATION;  Surgeon: [name], MD;  Location: [facility];  Service: Orthopedics;  Laterality: Right;  . APPLICATION OF WOUND VAC Right 07/25/2017   Procedure: APPLICATION OF WOUND VAC;  Surgeon: [name], MD;  Location: [facility];  Service: Orthopedics;  Laterality: Right;  . AV FISTULA PLACEMENT Right 06/23/2014   Procedure: ARTERIOVENOUS (AV) FISTULA CREATION;  Surgeon: Angelia Mould, MD;  Location: [facility];  Service: Vascular;  Laterality: Right;  . CARDIAC CATHETERIZATION     [facility]- evaluation for Kidney/ pancreas transplant  . EYE SURGERY Bilateral    "for glaucoma"  . INGUINAL HERNIA REPAIR Right    As a child  . IR DIALY SHUNT INTRO NEEDLE/INTRACATH INITIAL W/IMG RIGHT Right 09/29/2017  . LOWER EXTREMITY ANGIOGRAPHY N/A 05/27/2017   Procedure: LOWER EXTREMITY ANGIOGRAPHY;  Surgeon: [name], MD;  Location: [facility];  Service: Cardiovascular;  Laterality: N/A;  . STUMP REVISION Right 07/25/2017   Procedure: REVISION RIGHT BELOW KNEE AMPUTATION;;  Surgeon: [name], MD;  Location: [facility];  Service: Orthopedics;  Laterality: Right;  . WISDOM TOOTH EXTRACTION  1987    There were no vitals filed for this visit.  Subjective Assessment - 11/18/17 0937    Subjective  No new complaints. No falls or pain to report.     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 He wants to be active with 2 children (19yo & 16yo) who play sports.     Currently in Pain?  No/denies         11/18/17 2130  Transfers  Transfers Stand to Sit;Sit to Stand  Sit to Stand 6: Modified independent (Device/Increase time)  Stand to Sit 6: Modified independent (Device/Increase time)  Ambulation/Gait  Ambulation/Gait Yes  Ambulation/Gait Assistance 5: Supervision;4: Min guard  Ambulation/Gait Assistance Details cues on posture and weight shifting.   Ambulation Distance (Feet) 730 Feet  Assistive device Prosthesis;None  Gait Pattern Step-through pattern;Decreased stride length;Decreased stance time - right;Decreased weight shift to right;Narrow base of support  Ambulation Surface Level;Indoor;Unlevel;Outdoor;Paved;Gravel;Grass  High Level Balance  High Level Balance Activities Side stepping;Tandem walking;Braiding (tandem fwd/bwd, fwd/bwd squat walking)  High Level Balance Comments next to counter top with intermittent assistance: all performed for 3-4 laps each way (side stepping in squat position) with min guard to min assist for balance. cues on posture and ex form/technique.   Therapeutic Activites   Therapeutic Activities Lifting;Other Therapeutic Activities  Lifting lifting 20# crate from floor, carring for 50 feet and setting on waist high stacked boxes. lifting from boxes, carrying for 50 feet and setting on floor. supervision for safety only.   Other Therapeutic Activities pushing/pulling fully loaded cart for 15 feet x 2 laps each way. PTA demo'd technique prior to pt performance. min guard assit for safety.   Prosthetics  Current prosthetic wear tolerance (days/week)  daily  Current prosthetic wear tolerance (#hours/day)  all awake hours with drying as needed  Residual limb condition  intact per pt report  Donning Prosthesis 6  Doffing Prosthesis 6       11/18/17 1004  Balance Exercises:  Standing  SLS with Vectors Foam/compliant surface;Other reps (comment);Limitations  Balance Beam standing across red beam: alternating fwd stepping to floor/back onto beam, then alternating bwd stepping onto floor/back onto beam. cues on step length, step height and weight shifitng. min guard to min assist for balance.   Balance Exercises: Standing  SLS with Vectors Limitations standing on balance board in ant/post direction with 2 tall yoga blocks in front, no UE support to intermittent touch: alternating fwd toe taps, then alternating cross toe taps. min assist with cues on posture and weight shifting for balance assistance.       PT Short Term Goals - 11/17/17 0850      PT SHORT TERM GOAL #1   Title  Patient tolerates prosthesis wear >12hrs total /day without skin issues. (All STGs Target Date: 11/19/2017)    Baseline  11/17/17: goal met    Status  Achieved      PT SHORT TERM GOAL #2   Title  Patient verbalizes proper adjusting ply socks  with limb volume changes.     Baseline  11/17/17: goal met    Status  Achieved      PT SHORT TERM GOAL #3   Title  Patient ambulates 300' with cane & prosthesis with supervision.     Baseline  11/17/17: met today    Time  --    Period  --    Status  Achieved      PT SHORT TERM GOAL #4   Title  Patient negotiates ramps, curbs & stairs single rail with cane with supervision.     Baseline  11/17/17: met today    Time  --    Period  --    Status  Achieved      PT SHORT TERM GOAL #5   Title  Patient is able to pick up 5# from floor with supervision.     Baseline  11/14/17: met today with 20# crate    Time  --    Period  --    Status  Achieved        PT Long Term Goals - 10/21/17 2252      PT LONG TERM GOAL #1   Title  Patient verbalizes & demonstrates understanding of prosthetic care to enable safe use of prosthesis. (All LTGs Target Date: 01/16/2018)    Time  3    Period  Months    Status  New    Target Date  01/16/18      PT LONG TERM  GOAL #2   Title  Patient tolerates prosthesis wear >90% of awake hours without skin issues to enable function throughout the day.     Time  3    Period  Months    Status  New    Target Date  01/16/18      PT LONG TERM GOAL #3   Title  Berg Balance >/= 48/56 to indicate lower fall risk.     Time  3    Period  Months    Status  New    Target Date  01/16/18      PT LONG TERM GOAL #4   Title  Functional Gait Assessment >19/30 with prosthesis only to indicate lower fall risk.     Time  3    Period  Months    Status  New    Target Date  01/16/18      PT LONG TERM GOAL #5   Title  Patient ambulates >500' with prosthesis only outdoors including grass, ramps, curbs & stairs modified independent.     Time  3    Period  Months    Status  New    Target Date  01/16/18           11/18/17 1324  Plan  Clinical Impression Statement Today's skilled session continued to focus on gait/barriers without AD and high level balance reactions with prosthesis only. The pt is making steady progress toward goals and should benefit from continued PT to progress toward unmet goals.   Pt will benefit from skilled therapeutic intervention in order to improve on the following deficits Abnormal gait;Decreased activity tolerance;Decreased balance;Decreased endurance;Decreased mobility;Decreased skin integrity;Decreased strength;Dizziness;Impaired flexibility;Postural dysfunction;Prosthetic Dependency  Rehab Potential Good  PT Frequency 2x / week  PT Duration Other (comment) (13 weeks (90 days))  PT Treatment/Interventions ADLs/Self Care Home Management;Canalith Repostioning;DME Instruction;Gait training;Stair training;Functional mobility training;Therapeutic activities;Therapeutic exercise;Balance training;Neuromuscular re-education;Patient/family education;Prosthetic Training;Vestibular  PT Next Visit Plan continue to work on gait outdoors with prothesis only,  high level balance, work on simulated work  activities- climbing laddres, pushing/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 Type 1 diabetes mellitus with other circulatory complications ([place]) 02/13/7251  . Cardiac arrest ([place]) 09/24/2017  . Acute respiratory failure with hypoxia ([place]) 08/07/2017  . Respiratory distress 08/07/2017  . Dehiscence of amputation stump ([place]) 07/17/2017  . Benign essential HTN   . Chronic diastolic (congestive) heart failure ([place])   . Labile blood glucose   . Labile blood pressure   . Vomiting   . Phantom limb pain ([place])   . Poorly controlled type 2 diabetes mellitus with peripheral neuropathy ([place])   . Unilateral complete BKA, right, sequela ([place])   . Amputation of right lower extremity below knee ([place]) 06/10/2017  . ESRD on dialysis ([place])   . Chronic diastolic congestive heart failure ([place])   . History of PSVT (paroxysmal supraventricular tachycardia)   . Diabetes mellitus type 2 in nonobese (HCC)   . Post-operative pain   . Acute blood loss anemia   . Anemia of chronic disease   . Leukocytosis   . Tachycardia   . History of right below knee amputation ([place]) 06/06/2017  . PVD (peripheral vascular disease) ([place]) 05/13/2017  . Bilateral pleural effusion 11/28/2015  . Loculated pleural effusion 11/28/2015  . HCAP (healthcare-associated pneumonia) 11/28/2015  . ESRD (end stage renal  disease) on dialysis ([place]) 11/12/2015  . Hyperkalemia 11/12/2015  . Shoulder pain, acute 11/12/2015  . Nausea & vomiting 11/12/2015  . Diarrhea 11/12/2015  . Hypertensive urgency 05/05/2014  . Nephrotic syndrome 12/29/2013  . Hyponatremia 12/28/2013  . Acute diastolic heart failure ([place]) 12/28/2013  . Hypoglycemia 12/28/2013  . Dyspnea   . Acute renal failure syndrome ([place])   . Diabetic ketoacidosis without coma associated with type 1 diabetes mellitus ([place])   . Demand ischemia ([place]) 12/24/2013  . DKA (diabetic ketoacidoses) ([place]) 12/23/2013  . CAP (community acquired pneumonia) 12/23/2013  . Sepsis due to pneumonia ([place]) 12/23/2013  . Metabolic acidemia 66/44/0347  . Acute renal failure ([place]) 12/23/2013  . Diabetic ketoacidosis ([place]) 12/23/2013  . Chronic diastolic heart failure ([place]) 04/14/2013  . PSVT (paroxysmal supraventricular tachycardia) ([place]) 04/14/2013  . Bilateral leg edema 03/26/2013  . Chronic kidney disease, stage III (moderate) ([place]) 09/29/2011  . Chest pain 09/26/2010  . Type 1 diabetes mellitus with end-stage renal disease ([place]) 09/26/2010  . Essential hypertension, benign 09/26/2010  . Mixed hyperlipidemia 09/26/2010    Willow Ora, PTA, [facility] [address] [place], [place] [address] [phone] 11/19/17, 11:03 PM   Name: Lance [name] MRN: [id number] Date of Birth: 11-15-1970

## 2017-11-20 DIAGNOSIS — N186 End stage renal disease: Secondary | ICD-10-CM | POA: Diagnosis not present

## 2017-11-20 DIAGNOSIS — K7689 Other specified diseases of liver: Secondary | ICD-10-CM | POA: Diagnosis not present

## 2017-11-20 DIAGNOSIS — R509 Fever, unspecified: Secondary | ICD-10-CM | POA: Diagnosis not present

## 2017-11-20 DIAGNOSIS — N2581 Secondary hyperparathyroidism of renal origin: Secondary | ICD-10-CM | POA: Diagnosis not present

## 2017-11-20 DIAGNOSIS — Z4931 Encounter for adequacy testing for hemodialysis: Secondary | ICD-10-CM | POA: Diagnosis not present

## 2017-11-20 DIAGNOSIS — D631 Anemia in chronic kidney disease: Secondary | ICD-10-CM | POA: Diagnosis not present

## 2017-11-21 ENCOUNTER — Other Ambulatory Visit: Payer: Self-pay | Admitting: "Endocrinology

## 2017-11-21 DIAGNOSIS — N186 End stage renal disease: Secondary | ICD-10-CM | POA: Diagnosis not present

## 2017-11-21 DIAGNOSIS — D631 Anemia in chronic kidney disease: Secondary | ICD-10-CM | POA: Diagnosis not present

## 2017-11-21 DIAGNOSIS — N2581 Secondary hyperparathyroidism of renal origin: Secondary | ICD-10-CM | POA: Diagnosis not present

## 2017-11-21 DIAGNOSIS — Z4931 Encounter for adequacy testing for hemodialysis: Secondary | ICD-10-CM | POA: Diagnosis not present

## 2017-11-21 DIAGNOSIS — R509 Fever, unspecified: Secondary | ICD-10-CM | POA: Diagnosis not present

## 2017-11-21 DIAGNOSIS — K7689 Other specified diseases of liver: Secondary | ICD-10-CM | POA: Diagnosis not present

## 2017-11-24 ENCOUNTER — Ambulatory Visit: Payer: Medicare Other | Admitting: Physical Therapy

## 2017-11-24 ENCOUNTER — Encounter: Payer: Self-pay | Admitting: Physical Therapy

## 2017-11-24 DIAGNOSIS — R509 Fever, unspecified: Secondary | ICD-10-CM | POA: Diagnosis not present

## 2017-11-24 DIAGNOSIS — R293 Abnormal posture: Secondary | ICD-10-CM

## 2017-11-24 DIAGNOSIS — R2689 Other abnormalities of gait and mobility: Secondary | ICD-10-CM | POA: Diagnosis not present

## 2017-11-24 DIAGNOSIS — D631 Anemia in chronic kidney disease: Secondary | ICD-10-CM | POA: Diagnosis not present

## 2017-11-24 DIAGNOSIS — M6281 Muscle weakness (generalized): Secondary | ICD-10-CM

## 2017-11-24 DIAGNOSIS — K7689 Other specified diseases of liver: Secondary | ICD-10-CM | POA: Diagnosis not present

## 2017-11-24 DIAGNOSIS — N186 End stage renal disease: Secondary | ICD-10-CM | POA: Diagnosis not present

## 2017-11-24 DIAGNOSIS — R2681 Unsteadiness on feet: Secondary | ICD-10-CM

## 2017-11-24 DIAGNOSIS — N2581 Secondary hyperparathyroidism of renal origin: Secondary | ICD-10-CM | POA: Diagnosis not present

## 2017-11-24 DIAGNOSIS — Z4931 Encounter for adequacy testing for hemodialysis: Secondary | ICD-10-CM | POA: Diagnosis not present

## 2017-11-24 NOTE — Therapy (Signed)
[name] [address] [name], [place], [address] Phone: [phone]   Fax:  [phone]  Physical Therapy Treatment  Patient Details  Name: [name] MRN: [id number] Date of Birth: [birth year] Referring Provider (PT): [name], MD   Encounter Date: 11/24/2017  PT End of Session - 11/24/17 1126    Visit Number  11    Number of Visits  26    Date for PT Re-Evaluation  01/16/18    Authorization Type  Medicare & BCBS     Authorization Time Period  $3100 oop max has been met.  [facility] PT & OT combined 60 visits with zero met at PT eval    Authorization - Visit Number  11    Authorization - Number of Visits  19    PT Start Time  0850    PT Stop Time  0930    PT Time Calculation (min)  40 min    Activity Tolerance  Patient tolerated treatment well    Behavior During Therapy  [facility] for tasks assessed/performed       Past Medical History:  Diagnosis Date  . Aortic stenosis    Very mild in 05/2007  . Arthritis    "right shoulder; right knee; feel like I've got it all over" (11/28/2015)  . CHF (congestive heart failure) ([place])   . Degenerative joint disease    Left TKA  . Diastolic heart failure (HCC)    LVEF 65-70% with grade 2 diastolic dysfunction  . ESRD (end stage renal disease) on dialysis [name])    (as of 09/24/2017) pt does HD at home on a M/Tu/Th/F schedule.  . Essential hypertension, benign 2000   LVH  . GERD (gastroesophageal reflux disease)   . HCAP (healthcare-associated pneumonia) 11/28/2015  . Hyperlipidemia 2000  . Loculated pleural effusion 11/28/2015   [facility] 11/28/2015  . Pneumonia 12/2013; 05/2014  . PSVT (paroxysmal supraventricular tachycardia) (HCC)    Nonsustained; asymptomatic; diagnosed by event recorder in 2006  . Tobacco abuse, in remission    20 pack years; discontinued in 1985; bronchitic changes on chest x-ray and 2009  . Type 1 diabetes mellitus ([place]) 1990    Past Surgical  History:  Procedure Laterality Date  . AMPUTATION Right 06/06/2017   Procedure: RIGHT BELOW KNEE AMPUTATION;  Surgeon: [name], MD;  Location: [place];  Service: Orthopedics;  Laterality: Right;  . APPLICATION OF WOUND VAC Right 07/25/2017   Procedure: APPLICATION OF WOUND VAC;  Surgeon: [name], MD;  Location: [place];  Service: Orthopedics;  Laterality: Right;  . AV FISTULA PLACEMENT Right 06/23/2014   Procedure: ARTERIOVENOUS (AV) FISTULA CREATION;  Surgeon: Angelia Mould, MD;  Location: [place];  Service: Vascular;  Laterality: Right;  . CARDIAC CATHETERIZATION     [facility]- evaluation for Kidney/ pancreas transplant  . EYE SURGERY Bilateral    "for glaucoma"  . INGUINAL HERNIA REPAIR Right    As a child  . IR DIALY SHUNT INTRO NEEDLE/INTRACATH INITIAL W/IMG RIGHT Right 09/29/2017  . LOWER EXTREMITY ANGIOGRAPHY N/A 05/27/2017   Procedure: LOWER EXTREMITY ANGIOGRAPHY;  Surgeon: [name], MD;  Location: [place] [facility];  Service: Cardiovascular;  Laterality: N/A;  . STUMP REVISION Right 07/25/2017   Procedure: REVISION RIGHT BELOW KNEE AMPUTATION;;  Surgeon: [name], MD;  Location: [place];  Service: Orthopedics;  Laterality: Right;  . WISDOM TOOTH EXTRACTION  1987    There were no vitals filed for  this visit.  Subjective Assessment - 11/24/17 0852    Subjective  No issues to report; residual limb and prosthesis are doing well.      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 He wants to be active with 2 children (19yo & 16yo) who play sports.                        [place] Adult PT Treatment/Exercise - 11/24/17 0855      Ambulation/Gait   Ambulation/Gait  Yes    Ambulation/Gait Assistance  6:  Modified independent (Device/Increase time)    Ambulation/Gait Assistance Details  without AD indoor MOD I; supervision outdoors without AD on paved surfaces, negotiating curbs and over parking bumpers.  Also practiced increasing and maintaining increased gait velocity outside to simulate crossing a busy street    Ambulation Distance (Feet)  500 Feet    Assistive device  Prostheses    Gait Pattern  Decreased stance time - right;Antalgic;Poor foot clearance - right    Ambulation Surface  Unlevel;Outdoor;Paved    Stairs  Yes    Stairs Assistance  4: Min assist;6: Modified independent (Device/Increase time)    Stairs Assistance Details (indicate cue type and reason)  MOD I with rail alternating sequence, min A without rail, alternating sequence    Stair Management Technique  No rails;One rail Left;Alternating pattern;Forwards    Number of Stairs  12    Height of Stairs  6    Ramp  5: Supervision    Ramp Details (indicate cue type and reason)  prosthesis only    Curb  5: Supervision    Curb Details (indicate cue type and reason)  prosthesis only      Therapeutic Activites    Therapeutic Activities  Work Web designer  performed training for safe ladder ascending and descending turning sideways with L hip towards ladder to keep COG close to ladder to prevent tipping, ascending with LLE, descending with RLE.  Able to perform first three rungs safely but on 4th rung pt required increased assistance for safety and balance due to smaller space for feet and decreased UE support. Performed x 4 reps.      Exercises   Exercises  Knee/Hip      Knee/Hip Exercises: Aerobic   Elliptical  1-2 minutes at 1.5 resistance but with greater focus on decreasing supporting weight through UE and increased use of LE and changing foot position to allow rocking through foot and decreasing pressure through R tibia.  Even with moving the foot forwards and backwards to hang toes and then heels off the back, pt  unable to continue due to pain.  Encouraged pt to try various foot positions on Elliptical at gym             PT Education - 11/24/17 1125    Education Details  test various foot positions on elliptical at gym; ladder negotiation    Person(s) Educated  Patient    Methods  Explanation;Demonstration    Comprehension  Need further instruction       PT Short Term Goals - 11/17/17 0850      PT SHORT TERM GOAL #1   Title  Patient tolerates prosthesis wear >12hrs total /day without skin issues. (All STGs Target Date: 11/19/2017)    Baseline  11/17/17: goal met    Status  Achieved      PT SHORT TERM GOAL #2   Title  Patient verbalizes proper adjusting ply socks with limb volume changes.     Baseline  11/17/17: goal met    Status  Achieved      PT SHORT TERM GOAL #3   Title  Patient ambulates 300' with cane & prosthesis with supervision.     Baseline  11/17/17: met today    Time  --    Period  --    Status  Achieved      PT SHORT TERM GOAL #4   Title  Patient negotiates ramps, curbs & stairs single rail with cane with supervision.     Baseline  11/17/17: met today    Time  --    Period  --    Status  Achieved      PT SHORT TERM GOAL #5   Title  Patient is able to pick up 5# from floor with supervision.     Baseline  11/14/17: met today with 20# crate    Time  --    Period  --    Status  Achieved        PT Long Term Goals - 11/24/17 1130      PT LONG TERM GOAL #1   Title  Patient verbalizes & demonstrates understanding of prosthetic care to enable safe use of prosthesis. (All LTGs Target Date: 01/16/2018)    Time  1    Period  Months    Status  Revised    Target Date  12/17/17      PT LONG TERM GOAL #2   Title  Patient tolerates prosthesis wear >90% of awake hours without skin issues to enable function throughout the day.     Time  1    Period  Months    Status  New    Target Date  12/17/17      PT LONG TERM GOAL #3   Title  Berg Balance >/= 48/56 to indicate  lower fall risk.     Time  1    Period  Months    Status  New    Target Date  12/17/17      PT LONG TERM GOAL #4   Title  Functional Gait Assessment >19/30 with prosthesis only to indicate lower fall risk.     Time  1    Period  Months    Status  New    Target Date  12/17/17      PT LONG TERM GOAL #5   Title  Patient ambulates >500' with prosthesis only outdoors including grass, ramps, curbs & stairs modified independent.     Time  1    Period  Months    Status  New    Target Date  12/17/17      Additional Long Term Goals   Additional Long Term Goals  Yes      PT LONG TERM GOAL #6   Title  Pt will perform job simulation tasks (climb ladder, lift and carry heavy items) safely, MOD I    Time  1    Period  Months    Status  New    Target Date  12/17/17            Plan - 11/24/17 1127  Clinical Impression Statement  Treatment session today focused on continued work simulation with education on safe ladder negotiation, continued focus on endurance activities for pt to perform at gym, dynamic gait training outside and when negotiating stairs with prosthesis only and no rail or cane.  Pt required supervision overall for gait over outdoor surfaces without cane but required min A for safe stair negotiation, alternating sequence without rail or cane.  Pt unable to tolerate endurance on ellipitical due to knee pain; pt to test various foot positions on elliptical at gym to see if he is better able to tolerate.  Pt is making fast progress; revised LTG to 4 weeks -expect pt will not need full 3 months to reach LTG.    Rehab Potential  Good    PT Frequency  2x / week    PT Duration  Other (comment)   13 weeks (90 days)   PT Treatment/Interventions  ADLs/Self Care Home Management;Canalith Repostioning;DME Instruction;Gait training;Stair training;Functional mobility training;Therapeutic activities;Therapeutic exercise;Balance training;Neuromuscular re-education;Patient/family  education;Prosthetic Training;Vestibular    PT Next Visit Plan  begin to work on jogging?  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 Type 1 diabetes mellitus with other circulatory complications ([place]) 51/70/0174  . Cardiac arrest ([place]) 09/24/2017  . Acute respiratory failure with hypoxia ([place]) 08/07/2017  . Respiratory distress 08/07/2017  . Dehiscence of amputation stump ([place]) 07/17/2017  . Benign essential HTN   . Chronic diastolic (congestive) heart failure ([place])   . Labile blood glucose   . Labile blood pressure   . Vomiting   . Phantom limb pain ([place])   . Poorly controlled type 2 diabetes mellitus with peripheral neuropathy ([place])   . Unilateral complete BKA, right, sequela ([place])   . Amputation of right lower extremity below knee ([place]) 06/10/2017  . ESRD on dialysis ([place])   . Chronic diastolic congestive heart failure ([place])   . History of PSVT (paroxysmal supraventricular tachycardia)   . Diabetes mellitus type 2 in nonobese (HCC)   . Post-operative pain   . Acute blood loss anemia   . Anemia of chronic disease   . Leukocytosis   . Tachycardia   . History of right below knee amputation ([place])  06/06/2017  . PVD (peripheral vascular disease) ([place]) 05/13/2017  . Bilateral pleural effusion 11/28/2015  . Loculated pleural effusion 11/28/2015  . HCAP (healthcare-associated pneumonia) 11/28/2015  . ESRD (end stage renal disease) on dialysis ([place]) 11/12/2015  . Hyperkalemia 11/12/2015  . Shoulder pain, acute 11/12/2015  . Nausea & vomiting 11/12/2015  . Diarrhea 11/12/2015  . Hypertensive urgency 05/05/2014  . Nephrotic syndrome 12/29/2013  . Hyponatremia 12/28/2013  . Acute diastolic heart failure ([place]) 12/28/2013  . Hypoglycemia 12/28/2013  . Dyspnea   . Acute renal failure syndrome ([place])   . Diabetic ketoacidosis without coma associated with type  1 diabetes mellitus ([place])   . Demand ischemia ([place]) 12/24/2013  . DKA (diabetic ketoacidoses) ([place]) 12/23/2013  . CAP (community acquired pneumonia) 12/23/2013  . Sepsis due to pneumonia ([place]) 12/23/2013  . Metabolic acidemia 29/84/7308  . Acute renal failure ([place]) 12/23/2013  . Diabetic ketoacidosis ([place]) 12/23/2013  . Chronic diastolic heart failure ([place]) 04/14/2013  . PSVT (paroxysmal supraventricular tachycardia) ([place]) 04/14/2013  . Bilateral leg edema 03/26/2013  . Chronic kidney disease, stage III (moderate) ([place]) 09/29/2011  . Chest pain 09/26/2010  . Type 1 diabetes mellitus with end-stage renal disease ([place]) 09/26/2010  . Essential hypertension, benign 09/26/2010  . Mixed hyperlipidemia 09/26/2010   Rico Junker, PT, DPT 11/24/17    11:34 AM    [place] [address] [place], [place], [address] Phone: [phone]   Fax:  [phone]  Name: SERVANDO KYLLONEN MRN: [id number] Date of Birth: 1970/12/05

## 2017-11-25 DIAGNOSIS — D631 Anemia in chronic kidney disease: Secondary | ICD-10-CM | POA: Diagnosis not present

## 2017-11-25 DIAGNOSIS — K7689 Other specified diseases of liver: Secondary | ICD-10-CM | POA: Diagnosis not present

## 2017-11-25 DIAGNOSIS — N186 End stage renal disease: Secondary | ICD-10-CM | POA: Diagnosis not present

## 2017-11-25 DIAGNOSIS — Z4931 Encounter for adequacy testing for hemodialysis: Secondary | ICD-10-CM | POA: Diagnosis not present

## 2017-11-25 DIAGNOSIS — R509 Fever, unspecified: Secondary | ICD-10-CM | POA: Diagnosis not present

## 2017-11-25 DIAGNOSIS — N2581 Secondary hyperparathyroidism of renal origin: Secondary | ICD-10-CM | POA: Diagnosis not present

## 2017-11-27 DIAGNOSIS — D631 Anemia in chronic kidney disease: Secondary | ICD-10-CM | POA: Diagnosis not present

## 2017-11-27 DIAGNOSIS — N186 End stage renal disease: Secondary | ICD-10-CM | POA: Diagnosis not present

## 2017-11-27 DIAGNOSIS — K7689 Other specified diseases of liver: Secondary | ICD-10-CM | POA: Diagnosis not present

## 2017-11-27 DIAGNOSIS — R509 Fever, unspecified: Secondary | ICD-10-CM | POA: Diagnosis not present

## 2017-11-27 DIAGNOSIS — Z4931 Encounter for adequacy testing for hemodialysis: Secondary | ICD-10-CM | POA: Diagnosis not present

## 2017-11-27 DIAGNOSIS — N2581 Secondary hyperparathyroidism of renal origin: Secondary | ICD-10-CM | POA: Diagnosis not present

## 2017-11-28 ENCOUNTER — Ambulatory Visit: Payer: Medicare Other | Admitting: Rehabilitation

## 2017-11-28 ENCOUNTER — Encounter: Payer: Self-pay | Admitting: Rehabilitation

## 2017-11-28 DIAGNOSIS — R2689 Other abnormalities of gait and mobility: Secondary | ICD-10-CM | POA: Diagnosis not present

## 2017-11-28 DIAGNOSIS — Z4931 Encounter for adequacy testing for hemodialysis: Secondary | ICD-10-CM | POA: Diagnosis not present

## 2017-11-28 DIAGNOSIS — N186 End stage renal disease: Secondary | ICD-10-CM | POA: Diagnosis not present

## 2017-11-28 DIAGNOSIS — R509 Fever, unspecified: Secondary | ICD-10-CM | POA: Diagnosis not present

## 2017-11-28 DIAGNOSIS — R293 Abnormal posture: Secondary | ICD-10-CM

## 2017-11-28 DIAGNOSIS — M6281 Muscle weakness (generalized): Secondary | ICD-10-CM | POA: Diagnosis not present

## 2017-11-28 DIAGNOSIS — N2581 Secondary hyperparathyroidism of renal origin: Secondary | ICD-10-CM | POA: Diagnosis not present

## 2017-11-28 DIAGNOSIS — K7689 Other specified diseases of liver: Secondary | ICD-10-CM | POA: Diagnosis not present

## 2017-11-28 DIAGNOSIS — R2681 Unsteadiness on feet: Secondary | ICD-10-CM | POA: Diagnosis not present

## 2017-11-28 DIAGNOSIS — D631 Anemia in chronic kidney disease: Secondary | ICD-10-CM | POA: Diagnosis not present

## 2017-11-28 NOTE — Therapy (Signed)
[name] [address] [place], [place], [address] Phone: [phone]   Fax:  [phone]  Physical Therapy Treatment  Patient Details  Name: Lance [name] MRN: [id number] Date of Birth: [birth year] Referring Provider (PT): [name], MD   Encounter Date: 11/28/2017  PT End of Session - 11/28/17 0937    Visit Number  12    Number of Visits  26    Date for PT Re-Evaluation  01/16/18    Authorization Type  Medicare & BCBS     Authorization Time Period  $3100 oop max has been met.  VL PT & OT combined 60 visits with zero met at PT eval    Authorization - Visit Number  12    Authorization - Number of Visits  60    PT Start Time  0933    PT Stop Time  1015    PT Time Calculation (min)  42 min    Activity Tolerance  Patient tolerated treatment well    Behavior During Therapy  WFL for tasks assessed/performed       Past Medical History:  Diagnosis Date  . Aortic stenosis    Very mild in 05/2007  . Arthritis    "right shoulder; right knee; feel like I've got it all over" (11/28/2015)  . CHF (congestive heart failure) ([place])   . Degenerative joint disease    Left TKA  . Diastolic heart failure (HCC)    LVEF 65-70% with grade 2 diastolic dysfunction  . ESRD (end stage renal disease) on dialysis [facility])    (as of 09/24/2017) pt does HD at home on a M/Tu/Th/F schedule.  . Essential hypertension, benign 2000   LVH  . GERD (gastroesophageal reflux disease)   . HCAP (healthcare-associated pneumonia) 11/28/2015  . Hyperlipidemia 2000  . Loculated pleural effusion 11/28/2015   [name] 11/28/2015  . Pneumonia 12/2013; 05/2014  . PSVT (paroxysmal supraventricular tachycardia) (HCC)    Nonsustained; asymptomatic; diagnosed by event recorder in 2006  . Tobacco abuse, in remission    20 pack years; discontinued in 1985; bronchitic changes on chest x-ray and 2009  . Type 1 diabetes mellitus ([place]) 1990    Past Surgical  History:  Procedure Laterality Date  . AMPUTATION Right 06/06/2017   Procedure: RIGHT BELOW KNEE AMPUTATION;  Surgeon: [name], MD;  Location: [facility];  Service: Orthopedics;  Laterality: Right;  . APPLICATION OF WOUND VAC Right 07/25/2017   Procedure: APPLICATION OF WOUND VAC;  Surgeon: [name], MD;  Location: [facility];  Service: Orthopedics;  Laterality: Right;  . AV FISTULA PLACEMENT Right 06/23/2014   Procedure: ARTERIOVENOUS (AV) FISTULA CREATION;  Surgeon: Angelia Mould, MD;  Location: [place] [facility];  Service: Vascular;  Laterality: Right;  . CARDIAC CATHETERIZATION     [facility]- evaluation for Kidney/ pancreas transplant  . EYE SURGERY Bilateral    "for glaucoma"  . INGUINAL HERNIA REPAIR Right    As a child  . IR DIALY SHUNT INTRO NEEDLE/INTRACATH INITIAL W/IMG RIGHT Right 09/29/2017  . LOWER EXTREMITY ANGIOGRAPHY N/A 05/27/2017   Procedure: LOWER EXTREMITY ANGIOGRAPHY;  Surgeon: [name], MD;  Location: [place] [facility];  Service: Cardiovascular;  Laterality: N/A;  . STUMP REVISION Right 07/25/2017   Procedure: REVISION RIGHT BELOW KNEE AMPUTATION;;  Surgeon: [name], MD;  Location: [place];  Service: Orthopedics;  Laterality: Right;  . WISDOM TOOTH EXTRACTION  1987    There were no vitals filed for  this visit.  Subjective Assessment - 11/28/17 0936    Subjective  "I think I may have overdone it at the gym.  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 He wants to be active with 2 children (19yo & 16yo) who play sports.     Currently in Pain?  Yes    Pain Score  3     Pain Location  Leg    Pain Orientation  Right;Left    Pain Descriptors / Indicators  Sore    Pain Type  Acute pain     Pain Onset  In the past 7 days    Pain Frequency  Intermittent    Aggravating Factors   sore from going to gym    Pain Relieving Factors  stretching                        [facility] Adult PT Treatment/Exercise - 11/28/17 0935      Ambulation/Gait   Ambulation/Gait  Yes    Ambulation/Gait Assistance  6: Modified independent (Device/Increase time)    Ambulation/Gait Assistance Details  Pt able to ambulate outdoors over all surfaces at mod I level without evidence of instability.      Ambulation Distance (Feet)  1000 Feet    Assistive device  Prosthesis    Gait Pattern  Decreased stance time - right;Antalgic;Poor foot clearance - right    Ambulation Surface  Level;Indoor;Unlevel;Outdoor;Paved;Grass    Curb  6: Modified independent (Device/increase time)      High Level Balance   High Level Balance Comments  High level balance and agility as well as pre-jogging activity during session as follows; With use of agility ladder had pt perform high stepping forward x 2 laps, laterally x 2 laps, forward stepping skipping a square to increase stride to better simulate jog x 4 laps, skipping x 4 sets of 20'.  Pt fatigued but tolerated well.  Did need to do LE stretching between tasks.  Progressed to forward jog with theraband on floor approx 3.5' apart from each other x 40' x 4 reps.  Pt did VERY well.  Then went outdoors and had pt jog over level asphalt x 4 reps of 40'.  Backwards jogging x 4 reps of 20' progressing to backwards jog with cutting to the L x 3 and R x 3 reps.  Note marked LOB when cutting to the L the first rep, however pt able to use step strategy to self correct.        Self-Care   Self-Care  Other Self-Care Comments    Other Self-Care Comments   Discussed POC and continuing with higher level activities to return to leisure activiites with children.              PT Education - 11/28/17 [phone]    Education Details  POC and goals    Person(s) Educated  Patient     Methods  Explanation    Comprehension  Verbalized understanding       PT Short Term Goals - 11/17/17 0850      PT SHORT TERM GOAL #1   Title  Patient tolerates prosthesis  wear >12hrs total /day without skin issues. (All STGs Target Date: 11/19/2017)    Baseline  11/17/17: goal met    Status  Achieved      PT SHORT TERM GOAL #2   Title  Patient verbalizes proper adjusting ply socks with limb volume changes.     Baseline  11/17/17: goal met    Status  Achieved      PT SHORT TERM GOAL #3   Title  Patient ambulates 300' with cane & prosthesis with supervision.     Baseline  11/17/17: met today    Time  --    Period  --    Status  Achieved      PT SHORT TERM GOAL #4   Title  Patient negotiates ramps, curbs & stairs single rail with cane with supervision.     Baseline  11/17/17: met today    Time  --    Period  --    Status  Achieved      PT SHORT TERM GOAL #5   Title  Patient is able to pick up 5# from floor with supervision.     Baseline  11/14/17: met today with 20# crate    Time  --    Period  --    Status  Achieved        PT Long Term Goals - 11/24/17 1130      PT LONG TERM GOAL #1   Title  Patient verbalizes & demonstrates understanding of prosthetic care to enable safe use of prosthesis. (All LTGs Target Date: 01/16/2018)    Time  1    Period  Months    Status  Revised    Target Date  12/17/17      PT LONG TERM GOAL #2   Title  Patient tolerates prosthesis wear >90% of awake hours without skin issues to enable function throughout the day.     Time  1    Period  Months    Status  New    Target Date  12/17/17      PT LONG TERM GOAL #3   Title  Berg Balance >/= 48/56 to indicate lower fall risk.     Time  1    Period  Months    Status  New    Target Date  12/17/17      PT LONG TERM GOAL #4   Title  Functional Gait Assessment >19/30 with prosthesis only to indicate lower fall risk.     Time  1    Period  Months    Status  New    Target Date  12/17/17       PT LONG TERM GOAL #5   Title  Patient ambulates >500' with prosthesis only outdoors including grass, ramps, curbs & stairs modified independent.     Time  1    Period  Months    Status  New    Target Date  12/17/17      Additional Long Term Goals   Additional Long Term Goals  Yes      PT LONG TERM GOAL #6   Title  Pt will perform job simulation tasks (climb ladder, lift and carry heavy items) safely, MOD I    Time  1    Period  Months    Status  New    Target Date  12/17/17            Plan - 11/28/17 0938    Clinical  Impression Statement  Skilled session focused on high level balance including pre-jogging, jogging and higher level football like agility skills.  Pt making excellent progress towards goals.     Rehab Potential  Good    PT Frequency  2x / week    PT Duration  Other (comment)   13 weeks (90 days)   PT Treatment/Interventions  ADLs/Self Care Home Management;Canalith Repostioning;DME Instruction;Gait training;Stair training;Functional mobility training;Therapeutic activities;Therapeutic exercise;Balance training;Neuromuscular re-education;Patient/family education;Prosthetic Training;Vestibular    PT Next Visit Plan  continue jogging outside (try on grass), he is going to bring basketball so work on basketball drills, football (agility/cutting), work continue to work on [employer] -alternating sequence and gait outdoors with prothesis only, high level balance, work on simulated work activities- Investment banker, operational, 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 Type 1 diabetes mellitus with other circulatory complications ([place]) 91/50/5697  . Cardiac arrest ([place]) 09/24/2017  . Acute respiratory failure with hypoxia ([place]) 08/07/2017  . Respiratory distress 08/07/2017  . Dehiscence of amputation stump ([place]) 07/17/2017  . Benign essential HTN   . Chronic diastolic (congestive) heart failure ([place])   . Labile blood glucose   . Labile blood pressure   . Vomiting   . Phantom limb pain ([place])   . Poorly controlled type 2 diabetes mellitus with peripheral neuropathy ([place])   . Unilateral complete BKA, right, sequela ([place])   . Amputation of right lower extremity below knee ([place]) 06/10/2017  . ESRD on dialysis ([place])   . Chronic diastolic congestive heart failure ([place])   . History of PSVT (paroxysmal supraventricular tachycardia)   . Diabetes mellitus type 2 in nonobese (HCC)   . Post-operative pain   . Acute blood loss anemia   . Anemia of chronic disease   . Leukocytosis   . Tachycardia   . History of right below knee amputation ([place]) 06/06/2017  . PVD (peripheral vascular disease) ([place]) 05/13/2017  . Bilateral pleural effusion 11/28/2015  . Loculated pleural effusion 11/28/2015  . HCAP (healthcare-associated pneumonia) 11/28/2015  . ESRD (end stage renal disease) on dialysis ([place]) 11/12/2015  . Hyperkalemia 11/12/2015  . Shoulder pain, acute 11/12/2015  . Nausea & vomiting 11/12/2015  . Diarrhea 11/12/2015  . Hypertensive urgency 05/05/2014  . Nephrotic syndrome 12/29/2013  . Hyponatremia 12/28/2013  . Acute diastolic heart failure ([place]) 12/28/2013  . Hypoglycemia 12/28/2013  . Dyspnea   . Acute renal failure syndrome ([place])   . Diabetic ketoacidosis without coma associated with type 1 diabetes mellitus ([place])   . Demand ischemia ([place])  12/24/2013  . DKA (diabetic ketoacidoses) ([place]) 12/23/2013  . CAP (community acquired pneumonia) 12/23/2013  . Sepsis due to pneumonia ([place]) 12/23/2013  . Metabolic acidemia 94/80/1655  . Acute renal failure ([place]) 12/23/2013  . Diabetic ketoacidosis ([place]) 12/23/2013  . Chronic diastolic heart failure ([place]) 04/14/2013  . PSVT (paroxysmal supraventricular tachycardia) ([place]) 04/14/2013  . Bilateral leg edema 03/26/2013  . Chronic kidney disease, stage III (moderate) (HCC)  09/29/2011  . Chest pain 09/26/2010  . Type 1 diabetes mellitus with end-stage renal disease ([place]) 09/26/2010  . Essential hypertension, benign 09/26/2010  . Mixed hyperlipidemia 09/26/2010    Cameron Sprang, PT, MPT [facility] [address] [place], [place], [address] Phone: [phone]   Fax:  [phone] 11/28/17, 1:09 PM  Name: KUSHAL SAUNDERS MRN: [id number] Date of Birth: Sep 06, 1970

## 2017-12-01 DIAGNOSIS — N2581 Secondary hyperparathyroidism of renal origin: Secondary | ICD-10-CM | POA: Diagnosis not present

## 2017-12-01 DIAGNOSIS — Z4931 Encounter for adequacy testing for hemodialysis: Secondary | ICD-10-CM | POA: Diagnosis not present

## 2017-12-01 DIAGNOSIS — K7689 Other specified diseases of liver: Secondary | ICD-10-CM | POA: Diagnosis not present

## 2017-12-01 DIAGNOSIS — R509 Fever, unspecified: Secondary | ICD-10-CM | POA: Diagnosis not present

## 2017-12-01 DIAGNOSIS — N186 End stage renal disease: Secondary | ICD-10-CM | POA: Diagnosis not present

## 2017-12-01 DIAGNOSIS — D631 Anemia in chronic kidney disease: Secondary | ICD-10-CM | POA: Diagnosis not present

## 2017-12-02 ENCOUNTER — Encounter: Payer: Self-pay | Admitting: Physical Therapy

## 2017-12-02 ENCOUNTER — Ambulatory Visit: Payer: Medicare Other | Admitting: Physical Therapy

## 2017-12-02 DIAGNOSIS — Z4931 Encounter for adequacy testing for hemodialysis: Secondary | ICD-10-CM | POA: Diagnosis not present

## 2017-12-02 DIAGNOSIS — R293 Abnormal posture: Secondary | ICD-10-CM | POA: Diagnosis not present

## 2017-12-02 DIAGNOSIS — R509 Fever, unspecified: Secondary | ICD-10-CM | POA: Diagnosis not present

## 2017-12-02 DIAGNOSIS — R2689 Other abnormalities of gait and mobility: Secondary | ICD-10-CM

## 2017-12-02 DIAGNOSIS — D631 Anemia in chronic kidney disease: Secondary | ICD-10-CM | POA: Diagnosis not present

## 2017-12-02 DIAGNOSIS — R2681 Unsteadiness on feet: Secondary | ICD-10-CM | POA: Diagnosis not present

## 2017-12-02 DIAGNOSIS — K7689 Other specified diseases of liver: Secondary | ICD-10-CM | POA: Diagnosis not present

## 2017-12-02 DIAGNOSIS — N2581 Secondary hyperparathyroidism of renal origin: Secondary | ICD-10-CM | POA: Diagnosis not present

## 2017-12-02 DIAGNOSIS — N186 End stage renal disease: Secondary | ICD-10-CM | POA: Diagnosis not present

## 2017-12-02 DIAGNOSIS — M6281 Muscle weakness (generalized): Secondary | ICD-10-CM | POA: Diagnosis not present

## 2017-12-02 NOTE — Therapy (Signed)
Lance [name] [address] [place], [place], [address] Phone: [phone]   Fax:  [phone]  Physical Therapy Treatment  Patient Details  Name: Lance [name] MRN: [id number] Date of Birth: 1970/11/18 Referring Provider (PT): [name], MD   Encounter Date: 12/02/2017  PT End of Session - 12/02/17 0936    Visit Number  13    Number of Visits  26    Date for PT Re-Evaluation  01/16/18    Authorization Type  Medicare & BCBS     Authorization Time Period  $3100 oop max has been met.  VL PT & OT combined 60 visits with zero met at PT eval    Authorization - Visit Number  13    Authorization - Number of Visits  60    PT Start Time  0933    PT Stop Time  1013    PT Time Calculation (min)  40 min    Equipment Utilized During Treatment  Gait belt    Activity Tolerance  Patient tolerated treatment well    Behavior During Therapy  WFL for tasks assessed/performed       Past Medical History:  Diagnosis Date  . Aortic stenosis    Very mild in 05/2007  . Arthritis    "right shoulder; right knee; feel like I've got it all over" (11/28/2015)  . CHF (congestive heart failure) ([place])   . Degenerative joint disease    Left TKA  . Diastolic heart failure (HCC)    LVEF 65-70% with grade 2 diastolic dysfunction  . ESRD (end stage renal disease) on dialysis [facility])    (as of 09/24/2017) pt does HD at home on a M/Tu/Th/F schedule.  . Essential hypertension, benign 2000   LVH  . GERD (gastroesophageal reflux disease)   . HCAP (healthcare-associated pneumonia) 11/28/2015  . Hyperlipidemia 2000  . Loculated pleural effusion 11/28/2015   [name] 11/28/2015  . Pneumonia 12/2013; 05/2014  . PSVT (paroxysmal supraventricular tachycardia) (HCC)    Nonsustained; asymptomatic; diagnosed by event recorder in 2006  . Tobacco abuse, in remission    20 pack years; discontinued in 1985; bronchitic changes on chest x-ray and 2009  . Type  1 diabetes mellitus ([place]) 1990    Past Surgical History:  Procedure Laterality Date  . AMPUTATION Right 06/06/2017   Procedure: RIGHT BELOW KNEE AMPUTATION;  Surgeon: [name], MD;  Location: [place];  Service: Orthopedics;  Laterality: Right;  . APPLICATION OF WOUND VAC Right 07/25/2017   Procedure: APPLICATION OF WOUND VAC;  Surgeon: [name], MD;  Location: [place];  Service: Orthopedics;  Laterality: Right;  . AV FISTULA PLACEMENT Right 06/23/2014   Procedure: ARTERIOVENOUS (AV) FISTULA CREATION;  Surgeon: Angelia Mould, MD;  Location: [place];  Service: Vascular;  Laterality: Right;  . CARDIAC CATHETERIZATION     [facility]- evaluation for Kidney/ pancreas transplant  . EYE SURGERY Bilateral    "for glaucoma"  . INGUINAL HERNIA REPAIR Right    As a child  . IR DIALY SHUNT INTRO NEEDLE/INTRACATH INITIAL W/IMG RIGHT Right 09/29/2017  . LOWER EXTREMITY ANGIOGRAPHY N/A 05/27/2017   Procedure: LOWER EXTREMITY ANGIOGRAPHY;  Surgeon: [name], MD;  Location: [place] [facility];  Service: Cardiovascular;  Laterality: N/A;  . STUMP REVISION Right 07/25/2017   Procedure: REVISION RIGHT BELOW KNEE AMPUTATION;;  Surgeon: [name], MD;  Location: [place];  Service: Orthopedics;  Laterality: Right;  . WISDOM TOOTH EXTRACTION  1987    There were no vitals filed for this visit.  Subjective Assessment - 12/02/17 0935    Subjective  No new complaints. No falls or pain to report.     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 He wants to be active with 2 children (19yo & 16yo) who play sports.     Currently in Pain?  No/denies    Pain Score  0-No pain          [facility] Adult PT Treatment/Exercise - 12/02/17 0937      Transfers    Transfers  Stand to Sit;Sit to Stand    Sit to Stand  6: Modified independent (Device/Increase time)    Stand to Sit  6: Modified independent (Device/Increase time)      Ambulation/Gait   Ambulation/Gait  Yes    Ambulation/Gait Assistance  6: Modified independent (Device/Increase time);5: Supervision    Ambulation/Gait Assistance Details  close supervision when engaged in dynamic challenges.     Assistive device  Prosthesis    Gait Pattern  Decreased stance time - right;Antalgic;Poor foot clearance - right    Ambulation Surface  Level;Indoor      High Level Balance   High Level Balance Comments  High level balance and agility: jump shots on solid floor in modified tandem x 5-6 reps each foot forward, min guard assist for balance; in back of gym fwd/bwd/lateral walking/light jog while bouncing/dribbling ball with quick changes, min guard assist for balance; agility ladder in hallway: worked on high knees, lateral wided/narrow stepping (alternating with each square), and fwd/bwd lateral jumps, min guard assist for balance; mini tramp in parallel bars: feet hip width apart worked on jump shots, then scissor kicks, progressing to lateral jumps out/in, ended with staggered stance (modified tandem) jump shots. all with min guard assist, light touch on bars.        Knee/Hip Exercises: Aerobic   Tread Mill  bil UE support for 6 mintues: intervals of walk (1.3-1.58mh), then light jog (2.3-2.5 mph) changing every minute. min guard assist for balance.       Prosthetics   Prosthetic Care Comments   having issues with prosthesis/pin getting "hung" when trying to take it off. sometimes the botton to remove it clicks, other times it does not and he has to play with it to get it off.      Current prosthetic wear tolerance (days/week)   daily    Current prosthetic wear tolerance (#hours/day)   all awake hours with drying as needed    Residual limb condition   intact per pt report    Donning Prosthesis   Supervision    Doffing Prosthesis  Supervision               PT Short Term Goals - 11/17/17 0850      PT SHORT TERM GOAL #1   Title  Patient tolerates prosthesis wear >12hrs total /day without skin issues. (All STGs Target Date: 11/19/2017)    Baseline  11/17/17: goal met    Status  Achieved      PT SHORT TERM GOAL #2   Title  Patient verbalizes proper adjusting ply socks with limb volume changes.     Baseline  11/17/17: goal met    Status  Achieved      PT SHORT TERM GOAL #3   Title  Patient ambulates 300' with cane & prosthesis with supervision.     Baseline  11/17/17: met today    Time  --    Period  --    Status  Achieved      PT SHORT TERM GOAL #4   Title  Patient negotiates ramps, curbs & stairs single rail with cane with supervision.     Baseline  11/17/17: met today    Time  --    Period  --    Status  Achieved      PT SHORT TERM GOAL #5   Title  Patient is able to pick up 5# from floor with supervision.     Baseline  11/14/17: met today with 20# crate    Time  --    Period  --    Status  Achieved        PT Long Term Goals - 11/24/17 1130      PT LONG TERM GOAL #1   Title  Patient verbalizes & demonstrates understanding of prosthetic care to enable safe use of prosthesis. (All LTGs Target Date: 01/16/2018)    Time  1    Period  Months    Status  Revised    Target Date  12/17/17      PT LONG TERM GOAL #2   Title  Patient tolerates prosthesis wear >90% of awake hours without skin issues to enable function throughout the day.     Time  1    Period  Months    Status  New    Target Date  12/17/17      PT LONG TERM GOAL #3   Title  Berg Balance >/= 48/56 to indicate lower fall risk.     Time  1    Period  Months    Status  New    Target Date  12/17/17      PT LONG TERM GOAL #4   Title  Functional Gait Assessment >19/30 with prosthesis only to indicate lower fall risk.     Time  1    Period  Months    Status  New    Target Date  12/17/17       PT LONG TERM GOAL #5   Title  Patient ambulates >500' with prosthesis only outdoors including grass, ramps, curbs & stairs modified independent.     Time  1    Period  Months    Status  New    Target Date  12/17/17      Additional Long Term Goals   Additional Long Term Goals  Yes      PT LONG TERM GOAL #6   Title  Pt will perform job simulation tasks (climb ladder, lift and carry heavy items) safely, MOD I    Time  1    Period  Months    Status  New    Target Date  12/17/17            Plan - 12/02/17 0936    Clinical Impression Statement  Today's skilled session focused on high level balance activities and sports related drills with prosthesis only. No significant issues reported with session however pt does continued to have an antalgic gait that is more pronounced when fatigued. The pt is progressing toward goals (which may need to be updated) and should benefit from  continued PT to progress toward unmet goals.                       Rehab Potential  Good    PT Frequency  2x / week    PT Duration  Other (comment)   13 weeks (90 days)   PT Treatment/Interventions  ADLs/Self Care Home Management;Canalith Repostioning;DME Instruction;Gait training;Stair training;Functional mobility training;Therapeutic activities;Therapeutic exercise;Balance training;Neuromuscular re-education;Patient/family education;Prosthetic Training;Vestibular    PT Next Visit Plan  assess LTGs for update to goals by primary PT, pt to bring in any current issues he's having to assist with this.     Consulted and Agree with Plan of Care  Patient       Patient will benefit from skilled therapeutic intervention in order to improve the following deficits and impairments:  Abnormal gait, Decreased activity tolerance, Decreased balance, Decreased endurance, Decreased mobility, Decreased skin integrity, Decreased strength, Dizziness, Impaired flexibility, Postural dysfunction, Prosthetic Dependency  Visit  Diagnosis: Unsteadiness on feet  Other abnormalities of gait and mobility  Abnormal posture     Problem List Patient Active Problem List   Diagnosis Date Noted  . 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 Cardiac arrest ([place]) 09/24/2017  . Acute respiratory failure with hypoxia ([place]) 08/07/2017  . Respiratory distress 08/07/2017  . Dehiscence of amputation stump ([place]) 07/17/2017  . Benign essential HTN   . Chronic diastolic (congestive) heart failure ([place])   . Labile blood glucose   . Labile blood pressure   . Vomiting   . Phantom limb pain ([place])   . Poorly controlled type 2 diabetes mellitus with peripheral neuropathy ([place])   . Unilateral complete BKA, right, sequela ([place])   . Amputation of right lower extremity below knee ([place]) 06/10/2017  . ESRD on dialysis ([place])   . Chronic diastolic congestive heart failure ([place])   . History of PSVT (paroxysmal supraventricular tachycardia)   . Diabetes mellitus type 2 in nonobese (HCC)   . Post-operative pain   . Acute blood loss anemia   . Anemia of chronic disease   . Leukocytosis   . Tachycardia   . History of right below knee amputation ([place]) 06/06/2017  . PVD (peripheral vascular disease) ([place]) 05/13/2017  . Bilateral pleural effusion 11/28/2015  . Loculated pleural effusion 11/28/2015  . HCAP (healthcare-associated pneumonia) 11/28/2015  . ESRD (end stage renal disease) on dialysis ([place]) 11/12/2015  . Hyperkalemia 11/12/2015  . Shoulder pain, acute 11/12/2015  . Nausea & vomiting 11/12/2015  . Diarrhea 11/12/2015  . Hypertensive urgency 05/05/2014  . Nephrotic syndrome 12/29/2013  . Hyponatremia 12/28/2013  . Acute diastolic heart failure ([place]) 12/28/2013  . Hypoglycemia 12/28/2013  . Dyspnea   . Acute renal failure syndrome ([place])   . Diabetic ketoacidosis without coma associated with type 1 diabetes mellitus ([place])   . Demand ischemia ([place]) 12/24/2013  . DKA (diabetic  ketoacidoses) ([place]) 12/23/2013  . CAP (community acquired pneumonia) 12/23/2013  . Sepsis due to pneumonia ([place]) 12/23/2013  . Metabolic acidemia 56/38/7564  . Acute renal failure ([place]) 12/23/2013  . Diabetic ketoacidosis ([place]) 12/23/2013  . Chronic diastolic heart failure ([place]) 04/14/2013  . PSVT (paroxysmal supraventricular tachycardia) ([place]) 04/14/2013  . Bilateral leg edema 03/26/2013  . Chronic kidney disease, stage III (moderate) ([place]) 09/29/2011  . Chest pain 09/26/2010  . Type 1 diabetes mellitus with end-stage renal disease ([place]) 09/26/2010  . Essential hypertension, benign 09/26/2010  . Mixed hyperlipidemia 09/26/2010  Willow Ora, PTA, [facility] [address] [place], [place] [address] [phone] 12/02/17, 10:18 PM   Name: LATOYA DISKIN MRN: [id number] Date of Birth: Nov 14, 1970

## 2017-12-04 ENCOUNTER — Ambulatory Visit: Payer: Medicare Other | Admitting: Physical Therapy

## 2017-12-04 ENCOUNTER — Encounter: Payer: Self-pay | Admitting: Physical Therapy

## 2017-12-04 DIAGNOSIS — N2581 Secondary hyperparathyroidism of renal origin: Secondary | ICD-10-CM | POA: Diagnosis not present

## 2017-12-04 DIAGNOSIS — M6281 Muscle weakness (generalized): Secondary | ICD-10-CM

## 2017-12-04 DIAGNOSIS — N186 End stage renal disease: Secondary | ICD-10-CM | POA: Diagnosis not present

## 2017-12-04 DIAGNOSIS — D631 Anemia in chronic kidney disease: Secondary | ICD-10-CM | POA: Diagnosis not present

## 2017-12-04 DIAGNOSIS — R293 Abnormal posture: Secondary | ICD-10-CM

## 2017-12-04 DIAGNOSIS — R2681 Unsteadiness on feet: Secondary | ICD-10-CM

## 2017-12-04 DIAGNOSIS — R2689 Other abnormalities of gait and mobility: Secondary | ICD-10-CM | POA: Diagnosis not present

## 2017-12-04 DIAGNOSIS — Z4931 Encounter for adequacy testing for hemodialysis: Secondary | ICD-10-CM | POA: Diagnosis not present

## 2017-12-04 DIAGNOSIS — K7689 Other specified diseases of liver: Secondary | ICD-10-CM | POA: Diagnosis not present

## 2017-12-04 DIAGNOSIS — R509 Fever, unspecified: Secondary | ICD-10-CM | POA: Diagnosis not present

## 2017-12-05 DIAGNOSIS — N2581 Secondary hyperparathyroidism of renal origin: Secondary | ICD-10-CM | POA: Diagnosis not present

## 2017-12-05 DIAGNOSIS — D631 Anemia in chronic kidney disease: Secondary | ICD-10-CM | POA: Diagnosis not present

## 2017-12-05 DIAGNOSIS — Z4931 Encounter for adequacy testing for hemodialysis: Secondary | ICD-10-CM | POA: Diagnosis not present

## 2017-12-05 DIAGNOSIS — N186 End stage renal disease: Secondary | ICD-10-CM | POA: Diagnosis not present

## 2017-12-05 DIAGNOSIS — R509 Fever, unspecified: Secondary | ICD-10-CM | POA: Diagnosis not present

## 2017-12-05 DIAGNOSIS — K7689 Other specified diseases of liver: Secondary | ICD-10-CM | POA: Diagnosis not present

## 2017-12-05 NOTE — Therapy (Signed)
[facility] [address] [place], [place], [address] Phone: [phone]   Fax:  [phone]  Physical Therapy Treatment  Patient Details  Name: Lance [name] MRN: [id number] Date of Birth: 1970/02/25 Referring Provider (PT): [name], MD   Encounter Date: 12/04/2017  PT End of Session - 12/04/17 0938    Visit Number  14    Number of Visits  26    Date for PT Re-Evaluation  01/16/18    Authorization Type  Medicare & BCBS     Authorization Time Period  $3100 oop max has been met.  VL PT & OT combined 60 visits with zero met at PT eval    Authorization - Visit Number  14    Authorization - Number of Visits  90    PT Start Time  0935    PT Stop Time  1015    PT Time Calculation (min)  40 min    Equipment Utilized During Treatment  Gait belt    Activity Tolerance  Patient tolerated treatment well    Behavior During Therapy  WFL for tasks assessed/performed       Past Medical History:  Diagnosis Date  . Aortic stenosis    Very mild in 05/2007  . Arthritis    "right shoulder; right knee; feel like I've got it all over" (11/28/2015)  . CHF (congestive heart failure) ([place])   . Degenerative joint disease    Left TKA  . Diastolic heart failure (HCC)    LVEF 65-70% with grade 2 diastolic dysfunction  . ESRD (end stage renal disease) on dialysis [facility])    (as of 09/24/2017) pt does HD at home on a M/Tu/Th/F schedule.  . Essential hypertension, benign 2000   LVH  . GERD (gastroesophageal reflux disease)   . HCAP (healthcare-associated pneumonia) 11/28/2015  . Hyperlipidemia 2000  . Loculated pleural effusion 11/28/2015   [name] 11/28/2015  . Pneumonia 12/2013; 05/2014  . PSVT (paroxysmal supraventricular tachycardia) (HCC)    Nonsustained; asymptomatic; diagnosed by event recorder in 2006  . Tobacco abuse, in remission    20 pack years; discontinued in 1985; bronchitic changes on chest x-ray and 2009  . Type  1 diabetes mellitus ([place]) 1990    Past Surgical History:  Procedure Laterality Date  . AMPUTATION Right 06/06/2017   Procedure: RIGHT BELOW KNEE AMPUTATION;  Surgeon: [name], MD;  Location: [place];  Service: Orthopedics;  Laterality: Right;  . APPLICATION OF WOUND VAC Right 07/25/2017   Procedure: APPLICATION OF WOUND VAC;  Surgeon: [name], MD;  Location: [place];  Service: Orthopedics;  Laterality: Right;  . AV FISTULA PLACEMENT Right 06/23/2014   Procedure: ARTERIOVENOUS (AV) FISTULA CREATION;  Surgeon: Angelia Mould, MD;  Location: [place];  Service: Vascular;  Laterality: Right;  . CARDIAC CATHETERIZATION     [facility]- evaluation for Kidney/ pancreas transplant  . EYE SURGERY Bilateral    "for glaucoma"  . INGUINAL HERNIA REPAIR Right    As a child  . IR DIALY SHUNT INTRO NEEDLE/INTRACATH INITIAL W/IMG RIGHT Right 09/29/2017  . LOWER EXTREMITY ANGIOGRAPHY N/A 05/27/2017   Procedure: LOWER EXTREMITY ANGIOGRAPHY;  Surgeon: [name], MD;  Location: [place] [facility];  Service: Cardiovascular;  Laterality: N/A;  . STUMP REVISION Right 07/25/2017   Procedure: REVISION RIGHT BELOW KNEE AMPUTATION;;  Surgeon: [name], MD;  Location: [place];  Service: Orthopedics;  Laterality: Right;  . WISDOM TOOTH EXTRACTION  1987    There were no vitals filed for this visit.  Subjective Assessment - 12/04/17 0937    Subjective  Was a little sore after last session. Stretched and went to gym with improvement in soreness noted. No falls or pain today.     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 He wants to be active with 2 children (19yo & 16yo) who play sports.     Currently in Pain?  No/denies    Pain Score  0-No pain          [facility] PT Assessment - 12/04/17 0940      Transfers   Transfers  Stand to Sit;Sit to Stand    Sit to Stand  6: Modified independent (Device/Increase time)    Stand to Sit  6: Modified independent (Device/Increase time)      Ambulation/Gait   Ambulation/Gait  Yes    Ambulation/Gait Assistance  6: Modified independent (Device/Increase time)    Ambulation/Gait Assistance Details  no balance issues noted. unable to go through complaint surfaces today due to wet/muddy from recent rain.     Ambulation Distance (Feet)  500 Feet    Assistive device  Prosthesis    Gait Pattern  Step-through pattern;Decreased stance time - right;Antalgic;Narrow base of support    Ambulation Surface  Unlevel;Level;Indoor;Outdoor;Paved      Berg Balance Test   Sit to Stand  Able to stand without using hands and stabilize independently    Standing Unsupported  Able to stand safely 2 minutes    Sitting with Back Unsupported but Feet Supported on Floor or Stool  Able to sit safely and securely 2 minutes    Stand to Sit  Sits safely with minimal use of hands    Transfers  Able to transfer safely, minor use of hands    Standing Unsupported with Eyes Closed  Able to stand 10 seconds with supervision    Standing Ubsupported with Feet Together  Able to place feet together independently and stand 1 minute safely    From Standing, Reach Forward with Outstretched Arm  Can reach confidently >25 cm (10")    From Standing Position, Pick up Object from Floor  Able to pick up shoe safely and easily    From Standing Position, Turn to Look Behind Over each Shoulder  Looks behind from both sides and weight shifts well    Turn 360 Degrees  Able to turn 360 degrees safely but slowly   5-6 sec's both ways   Standing Unsupported, Alternately Place Feet on Step/Stool  Able to complete 4 steps without aid or supervision    Standing Unsupported, One Foot in Front  Able to plae foot ahead of the other independently and hold 30  seconds    Standing on One Leg  Able to lift leg independently and hold equal to or more than 3 seconds    Total Score  48      Functional Gait  Assessment   Gait assessed   Yes    Gait Level Surface  Walks 20 ft in less than 7 sec but greater than 5.5 sec, uses assistive device, slower speed, mild gait deviations, or deviates 6-10 in outside of the 12 in  walkway width.   5.75 sec   Change in Gait Speed  Able to smoothly change walking speed without loss of balance or gait deviation. Deviate no more than 6 in outside of the 12 in walkway width.    Gait with Horizontal Head Turns  Performs head turns smoothly with no change in gait. Deviates no more than 6 in outside 12 in walkway width    Gait with Vertical Head Turns  Performs head turns with no change in gait. Deviates no more than 6 in outside 12 in walkway width.    Gait and Pivot Turn  Pivot turns safely within 3 sec and stops quickly with no loss of balance.    Step Over Obstacle  Is able to step over one shoe box (4.5 in total height) without changing gait speed. No evidence of imbalance.    Gait with Narrow Base of Support  Ambulates less than 4 steps heel to toe or cannot perform without assistance.    Gait with Eyes Closed  Walks 20 ft, uses assistive device, slower speed, mild gait deviations, deviates 6-10 in outside 12 in walkway width. Ambulates 20 ft in less than 9 sec but greater than 7 sec.    Ambulating Backwards  Walks 20 ft, uses assistive device, slower speed, mild gait deviations, deviates 6-10 in outside 12 in walkway width.    Steps  Alternating feet, must use rail.    Total Score  22    FGA comment:  22/30= medium fall risk, no AD used today           [facility] Adult PT Treatment/Exercise - 12/04/17 0940      Prosthetics   Current prosthetic wear tolerance (days/week)   daily    Current prosthetic wear tolerance (#hours/day)   all awake hours with drying as needed    Residual limb condition   intact per pt report     Donning Prosthesis  Supervision    Doffing Prosthesis  Supervision          Balance Exercises - 12/04/17 1007      Balance Exercises: Standing   Standing Eyes Closed  Narrow base of support (BOS);Head turns;Foam/compliant surface;Other reps (comment);30 secs;Limitations      Balance Exercises: Standing   Standing Eyes Closed Limitations  on airex with no UE support, feet close together: EC no head movemens, progressing to EC head movements left<>right, then up<>down. min guard to min assist for balance,           PT Short Term Goals - 11/17/17 0850      PT SHORT TERM GOAL #1   Title  Patient tolerates prosthesis wear >12hrs total /day without skin issues. (All STGs Target Date: 11/19/2017)    Baseline  11/17/17: goal met    Status  Achieved      PT SHORT TERM GOAL #2   Title  Patient verbalizes proper adjusting ply socks with limb volume changes.     Baseline  11/17/17: goal met    Status  Achieved      PT SHORT TERM GOAL #3   Title  Patient ambulates 300' with cane & prosthesis with supervision.     Baseline  11/17/17: met today    Time  --    Period  --    Status  Achieved      PT SHORT TERM GOAL #4   Title  Patient negotiates ramps, curbs & stairs single rail with cane with supervision.  Baseline  11/17/17: met today    Time  --    Period  --    Status  Achieved      PT SHORT TERM GOAL #5   Title  Patient is able to pick up 5# from floor with supervision.     Baseline  11/14/17: met today with 20# crate    Time  --    Period  --    Status  Achieved        PT Long Term Goals - 12/04/17 0939      PT LONG TERM GOAL #1   Title  Patient verbalizes & demonstrates understanding of prosthetic care to enable safe use of prosthesis. (All LTGs Target Date: 01/16/2018)    Baseline  12/04/17: met to date, may need assist/eduation with unfamiliar concepts    Status  Achieved      PT LONG TERM GOAL #2   Title  Patient tolerates prosthesis wear >90% of awake hours  without skin issues to enable function throughout the day.     Baseline  12/04/17: met today    Status  Achieved      PT LONG TERM GOAL #3   Title  Berg Balance >/= 48/56 to indicate lower fall risk.     Baseline  12/04/17: 48/56 scored today    Time  --    Period  --    Status  Achieved      PT LONG TERM GOAL #4   Title  Functional Gait Assessment >19/30 with prosthesis only to indicate lower fall risk.     Baseline  12/04/17: 22/30 scored    Time  --    Period  --    Status  Achieved      PT LONG TERM GOAL #5   Title  Patient ambulates >500' with prosthesis only outdoors including grass, ramps, curbs & stairs modified independent.     Baseline  12/04/17: met with paved surfaces, unable to test other due to wet/muddy conditions    Time  --    Period  --    Status  Partially Met      PT LONG TERM GOAL #6   Title  Pt will perform job simulation tasks (climb ladder, lift and carry heavy items) safely, MOD I    Time  1    Period  Months    Status  New            Plan - 12/04/17 0938    Clinical Impression Statement  Today's skilled session focused on progress toward current LTGs with intention of primary PT updating goals for more high level/return to sports goals. The pt is progressing well toward goals and should benefit from continued PT to progress toward unment goals.     Rehab Potential  Good    PT Frequency  2x / week    PT Duration  Other (comment)   13 weeks (90 days)   PT Treatment/Interventions  ADLs/Self Care Home Management;Canalith Repostioning;DME Instruction;Gait training;Stair training;Functional mobility training;Therapeutic activities;Therapeutic exercise;Balance training;Neuromuscular re-education;Patient/family education;Prosthetic Training;Vestibular    PT Next Visit Plan  continue working on high level balance- plyometrics, pre running to running, sports drills, strengtheing for endurance    Consulted and Agree with Plan of Care  Patient        Patient will benefit from skilled therapeutic intervention in order to improve the following deficits and impairments:  Abnormal gait, Decreased activity tolerance, Decreased balance, Decreased endurance, Decreased mobility, Decreased skin  integrity, Decreased strength, Dizziness, Impaired flexibility, Postural dysfunction, Prosthetic Dependency  Visit Diagnosis: Unsteadiness on feet  Other abnormalities of gait and mobility  Abnormal posture  Muscle weakness (generalized)     Problem List Patient Active Problem List   Diagnosis Date Noted  . Type 1 diabetes mellitus with other circulatory complications ([place]) 94/08/6806  . Cardiac arrest ([place]) 09/24/2017  . Acute respiratory failure with hypoxia ([place]) 08/07/2017  . Respiratory distress 08/07/2017  . Dehiscence of amputation stump ([place]) 07/17/2017  . Benign essential HTN   . Chronic diastolic (congestive) heart failure ([place])   . Labile blood glucose   . Labile blood pressure   . Vomiting   . Phantom limb pain ([place])   . Poorly controlled type 2 diabetes mellitus with peripheral neuropathy ([place])   . Unilateral complete BKA, right, sequela ([place])   . Amputation of right lower extremity below knee ([place]) 06/10/2017  . ESRD on dialysis ([place])   . Chronic diastolic congestive heart failure ([place])   . History of PSVT (paroxysmal supraventricular tachycardia)   . Diabetes mellitus type 2 in nonobese (HCC)   . Post-operative pain   . Acute blood loss anemia   . Anemia of chronic disease   . Leukocytosis   . Tachycardia   . History of right below knee amputation ([place]) 06/06/2017  . PVD (peripheral vascular disease) ([place]) 05/13/2017  . Bilateral pleural effusion 11/28/2015  . Loculated pleural effusion 11/28/2015  . HCAP (healthcare-associated pneumonia) 11/28/2015  . ESRD (end stage renal disease) on dialysis ([place]) 11/12/2015  . Hyperkalemia 11/12/2015  . Shoulder pain, acute 11/12/2015  . Nausea & vomiting 11/12/2015  .  Diarrhea 11/12/2015  . Hypertensive urgency 05/05/2014  . Nephrotic syndrome 12/29/2013  . Hyponatremia 12/28/2013  . Acute diastolic heart failure ([place]) 12/28/2013  . Hypoglycemia 12/28/2013  . Dyspnea   . Acute renal failure syndrome ([place])   . Diabetic ketoacidosis without coma associated with type 1 diabetes mellitus ([place])   . Demand ischemia ([place]) 12/24/2013  . DKA (diabetic ketoacidoses) ([place]) 12/23/2013  . CAP (community acquired pneumonia) 12/23/2013  . Sepsis due to pneumonia ([place]) 12/23/2013  . Metabolic acidemia 81/11/3157  . Acute renal failure ([place]) 12/23/2013  . Diabetic ketoacidosis ([place]) 12/23/2013  . Chronic diastolic heart failure ([place]) 04/14/2013  . PSVT (paroxysmal supraventricular tachycardia) ([place]) 04/14/2013  . Bilateral leg edema 03/26/2013  . Chronic kidney disease, stage III (moderate) ([place]) 09/29/2011  . Chest pain 09/26/2010  . Type 1 diabetes mellitus with end-stage renal disease ([place]) 09/26/2010  . Essential hypertension, benign 09/26/2010  . Mixed hyperlipidemia 09/26/2010    Willow Ora, PTA, [facility] [address] [place], [place] [address] [phone] 12/05/17, 9:26 AM   Name: CAROLD EISNER MRN: [id number] Date of Birth: February 09, 1971

## 2017-12-08 ENCOUNTER — Encounter: Payer: Self-pay | Admitting: Physical Therapy

## 2017-12-08 ENCOUNTER — Ambulatory Visit: Payer: Medicare Other | Admitting: Physical Therapy

## 2017-12-08 DIAGNOSIS — R2689 Other abnormalities of gait and mobility: Secondary | ICD-10-CM

## 2017-12-08 DIAGNOSIS — M6281 Muscle weakness (generalized): Secondary | ICD-10-CM | POA: Diagnosis not present

## 2017-12-08 DIAGNOSIS — K7689 Other specified diseases of liver: Secondary | ICD-10-CM | POA: Diagnosis not present

## 2017-12-08 DIAGNOSIS — R293 Abnormal posture: Secondary | ICD-10-CM | POA: Diagnosis not present

## 2017-12-08 DIAGNOSIS — R2681 Unsteadiness on feet: Secondary | ICD-10-CM

## 2017-12-08 DIAGNOSIS — Z4931 Encounter for adequacy testing for hemodialysis: Secondary | ICD-10-CM | POA: Diagnosis not present

## 2017-12-08 DIAGNOSIS — N2581 Secondary hyperparathyroidism of renal origin: Secondary | ICD-10-CM | POA: Diagnosis not present

## 2017-12-08 DIAGNOSIS — R509 Fever, unspecified: Secondary | ICD-10-CM | POA: Diagnosis not present

## 2017-12-08 DIAGNOSIS — D631 Anemia in chronic kidney disease: Secondary | ICD-10-CM | POA: Diagnosis not present

## 2017-12-08 DIAGNOSIS — N186 End stage renal disease: Secondary | ICD-10-CM | POA: Diagnosis not present

## 2017-12-08 NOTE — Therapy (Signed)
[facility] [address] [place], [place], [address] Phone: [phone]   Fax:  [phone]  Physical Therapy Treatment  Patient Details  Name: Lance [name] MRN: [id number] Date of Birth: 1970/11/24 Referring Provider (PT): [name], MD   Encounter Date: 12/08/2017  PT End of Session - 12/08/17 1018    Visit Number  15    Number of Visits  26    Date for PT Re-Evaluation  01/16/18    Authorization Type  Medicare & BCBS     Authorization Time Period  $3100 oop max has been met.  [facility] PT & OT combined 60 visits with zero met at PT eval    Authorization - Visit Number  15    Authorization - Number of Visits  22    PT Start Time  0935    PT Stop Time  1016    PT Time Calculation (min)  41 min    Equipment Utilized During Treatment  --    Activity Tolerance  Patient tolerated treatment well    Behavior During Therapy  WFL for tasks assessed/performed       Past Medical History:  Diagnosis Date  . Aortic stenosis    Very mild in 05/2007  . Arthritis    "right shoulder; right knee; feel like I've got it all over" (11/28/2015)  . CHF (congestive heart failure) ([place])   . Degenerative joint disease    Left TKA  . Diastolic heart failure (HCC)    LVEF 65-70% with grade 2 diastolic dysfunction  . ESRD (end stage renal disease) on dialysis [facility])    (as of 09/24/2017) pt does HD at home on a M/Tu/Th/F schedule.  . Essential hypertension, benign 2000   LVH  . GERD (gastroesophageal reflux disease)   . HCAP (healthcare-associated pneumonia) 11/28/2015  . Hyperlipidemia 2000  . Loculated pleural effusion 11/28/2015   [name] 11/28/2015  . Pneumonia 12/2013; 05/2014  . PSVT (paroxysmal supraventricular tachycardia) (HCC)    Nonsustained; asymptomatic; diagnosed by event recorder in 2006  . Tobacco abuse, in remission    20 pack years; discontinued in 1985; bronchitic changes on chest x-ray and 2009  . Type 1  diabetes mellitus ([place]) 1990    Past Surgical History:  Procedure Laterality Date  . AMPUTATION Right 06/06/2017   Procedure: RIGHT BELOW KNEE AMPUTATION;  Surgeon: [name], MD;  Location: [facility];  Service: Orthopedics;  Laterality: Right;  . APPLICATION OF WOUND VAC Right 07/25/2017   Procedure: APPLICATION OF WOUND VAC;  Surgeon: [name], MD;  Location: [facility];  Service: Orthopedics;  Laterality: Right;  . AV FISTULA PLACEMENT Right 06/23/2014   Procedure: ARTERIOVENOUS (AV) FISTULA CREATION;  Surgeon: Angelia Mould, MD;  Location: [facility];  Service: Vascular;  Laterality: Right;  . CARDIAC CATHETERIZATION     [facility]- evaluation for Kidney/ pancreas transplant  . EYE SURGERY Bilateral    "for glaucoma"  . INGUINAL HERNIA REPAIR Right    As a child  . IR DIALY SHUNT INTRO NEEDLE/INTRACATH INITIAL W/IMG RIGHT Right 09/29/2017  . LOWER EXTREMITY ANGIOGRAPHY N/A 05/27/2017   Procedure: LOWER EXTREMITY ANGIOGRAPHY;  Surgeon: [name], MD;  Location: [facility];  Service: Cardiovascular;  Laterality: N/A;  . STUMP REVISION Right 07/25/2017   Procedure: REVISION RIGHT BELOW KNEE AMPUTATION;;  Surgeon: [name], MD;  Location: [facility];  Service: Orthopedics;  Laterality: Right;  . [place]  There were no vitals filed for this visit.  Subjective Assessment - 12/08/17 0942    Subjective  Did a lot of walking around this weekend due to [employer] homecoming; no issues but this morning had a little soreness/tenderness on R anterior tibia.  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 He wants to be active with 2 children (19yo & 16yo) who play  sports.     Currently in Pain?  Yes    Pain Score  2     Pain Location  Leg    Pain Orientation  Right    Pain Descriptors / Indicators  Sore;Tender    Pain Type  Acute pain                       [facility] Adult PT Treatment/Exercise - 12/08/17 0943      Therapeutic Activites    Therapeutic Activities  Work [employer]    Work Simulation  reviewed ladder but altered technique and sequence having pt turn LLE out into ER and track L knee to outside of ladder keeping RLE straight to keep pelvis and COG close to ladder and keeping COG below top portion of ladder.  Pt able to ascend and descend safely with supervision, no Min A needed today for safety.          Balance Exercises - 12/08/17 0953      Balance Exercises: Standing   Standing Eyes Opened  Narrow base of support (BOS);Head turns;Foam/compliant surface;Other reps (comment)   feet together, 2 sets 10 reps head turns/nods   Tandem Stance  Eyes open;Foam/compliant surface;Intermittent upper extremity support;10 secs;2 reps   static stand and head turns/nods x 10 each   Tandem Gait  Forward;Retro;Intermittent upper extremity support;Foam/compliant surface;4 reps    Sidestepping  Foam/compliant support;4 reps;Theraband        PT Education - 12/08/17 1018    Education Details  safer ladder technique, resting R leg/ice for soreness    Person(s) Educated  Patient    Methods  Explanation    Comprehension  Verbalized understanding       PT Short Term Goals - 11/17/17 0850      PT SHORT TERM GOAL #1   Title  Patient tolerates prosthesis wear >12hrs total /day without skin issues. (All STGs Target Date: 11/19/2017)    Baseline  11/17/17: goal met    Status  Achieved      PT SHORT TERM GOAL #2   Title  Patient verbalizes proper adjusting ply socks with limb volume changes.     Baseline  11/17/17: goal met    Status  Achieved      PT SHORT TERM GOAL #3   Title  Patient ambulates 300' with cane & prosthesis with  supervision.     Baseline  11/17/17: met today    Time  --    Period  --    Status  Achieved      PT SHORT TERM GOAL #4   Title  Patient negotiates ramps, curbs & stairs single rail with cane with supervision.     Baseline  11/17/17: met today  Time  --    Period  --    Status  Achieved      PT SHORT TERM GOAL #5   Title  Patient is able to pick up 5# from floor with supervision.     Baseline  11/14/17: met today with 20# crate    Time  --    Period  --    Status  Achieved        PT Long Term Goals - 12/08/17 1119      PT LONG TERM GOAL #1   Title  Patient verbalizes & demonstrates understanding of prosthetic care to enable safe use of prosthesis. (All LTGs Target Date: 01/16/2018)    Baseline  12/04/17: met to date, may need assist/eduation with unfamiliar concepts    Status  Achieved      PT LONG TERM GOAL #2   Title  Patient tolerates prosthesis wear >90% of awake hours without skin issues to enable function throughout the day.     Baseline  12/04/17: met today    Status  Achieved      PT LONG TERM GOAL #3   Title  Berg Balance >/= 52/56 to indicate lower fall risk.     Baseline  12/04/17: 48/56 scored today    Status  Revised      PT LONG TERM GOAL #4   Title  Functional Gait Assessment >/= 24/30 with prosthesis only to indicate lower fall risk.     Baseline  12/04/17: 22/30 scored    Status  Revised      PT LONG TERM GOAL #5   Title  Patient ambulates >1000' with prosthesis only outdoors including grass, ramps, curbs & stairs modified independent.     Baseline  12/04/17: met with paved surfaces, unable to test other due to wet/muddy conditions    Status  Revised      PT LONG TERM GOAL #6   Title  Pt will perform job simulation tasks (climb ladder, lift and carry heavy items) safely, MOD I    Time  1    Period  Months    Status  New            Plan - 12/08/17 1113    Clinical Impression Statement  Treatment session focused on review of technique for  ladder ascending and descending with pt performing with improved safety.  Due to increased pain/pressure on anterior tibial shaft did not perform plyometric, running, cutting exercises today.  Continued to focus on higher level balance on compliant surfaces, narrow BOS and head turns for balance reaction training.  Pt reported same amount of pressure/pain in RLE- will continue to monitor    Rehab Potential  Good    PT Frequency  2x / week    PT Duration  Other (comment)   13 weeks (90 days)   PT Treatment/Interventions  ADLs/Self Care Home Management;Canalith Repostioning;DME Instruction;Gait training;Stair training;Functional mobility training;Therapeutic activities;Therapeutic exercise;Balance training;Neuromuscular re-education;Patient/family education;Prosthetic Training;Vestibular    PT Next Visit Plan  how is the pain/pressure on front of R tibia?  high level balance - plyometrics, jogging, cutting drills, with floor ladder and outside on pavement or grass     Consulted and Agree with Plan of Care  Patient       Patient will benefit from skilled therapeutic intervention in order to improve the following deficits and impairments:  Abnormal gait, Decreased activity tolerance, Decreased balance, Decreased endurance, Decreased mobility, Decreased skin integrity, Decreased strength, Dizziness, Impaired flexibility, Postural  dysfunction, Prosthetic Dependency  Visit Diagnosis: Unsteadiness on feet  Other abnormalities of gait and mobility  Muscle weakness (generalized)     Problem List Patient Active Problem List   Diagnosis Date Noted  . 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 Cardiac arrest ([place]) 09/24/2017  . Acute respiratory failure with hypoxia ([place]) 08/07/2017  . Respiratory distress 08/07/2017  . Dehiscence of amputation stump ([place]) 07/17/2017  . Benign essential HTN   . Chronic diastolic (congestive) heart failure ([place])   . Labile  blood glucose   . Labile blood pressure   . Vomiting   . Phantom limb pain ([place])   . Poorly controlled type 2 diabetes mellitus with peripheral neuropathy ([place])   . Unilateral complete BKA, right, sequela ([place])   . Amputation of right lower extremity below knee ([place]) 06/10/2017  . ESRD on dialysis ([place])   . Chronic diastolic congestive heart failure ([place])   . History of PSVT (paroxysmal supraventricular tachycardia)   . Diabetes mellitus type 2 in nonobese (HCC)   . Post-operative pain   . Acute blood loss anemia   . Anemia of chronic disease   . Leukocytosis   . Tachycardia   . History of right below knee amputation ([place]) 06/06/2017  . PVD (peripheral vascular disease) ([place]) 05/13/2017  . Bilateral pleural effusion 11/28/2015  . Loculated pleural effusion 11/28/2015  . HCAP (healthcare-associated pneumonia) 11/28/2015  . ESRD (end stage renal disease) on dialysis ([place]) 11/12/2015  . Hyperkalemia 11/12/2015  . Shoulder pain, acute 11/12/2015  . Nausea & vomiting 11/12/2015  . Diarrhea 11/12/2015  . Hypertensive urgency 05/05/2014  . Nephrotic syndrome 12/29/2013  . Hyponatremia 12/28/2013  . Acute diastolic heart failure ([place]) 12/28/2013  . Hypoglycemia 12/28/2013  . Dyspnea   . Acute renal failure syndrome ([place])   . Diabetic ketoacidosis without coma associated with type 1 diabetes mellitus ([place])   . Demand ischemia ([place]) 12/24/2013  . DKA (diabetic ketoacidoses) ([place]) 12/23/2013  . CAP (community acquired pneumonia) 12/23/2013  . Sepsis due to pneumonia ([place]) 12/23/2013  . Metabolic acidemia 21/12/7354  . Acute renal failure ([place]) 12/23/2013  . Diabetic ketoacidosis ([place]) 12/23/2013  . Chronic diastolic heart failure ([place]) 04/14/2013  . PSVT (paroxysmal supraventricular tachycardia) ([place]) 04/14/2013  . Bilateral leg edema 03/26/2013  . Chronic kidney disease, stage III (moderate) ([place]) 09/29/2011  . Chest pain 09/26/2010  . Type 1 diabetes mellitus with end-stage renal  disease ([place]) 09/26/2010  . Essential hypertension, benign 09/26/2010  . Mixed hyperlipidemia 09/26/2010    Rico Junker, PT, DPT 12/08/17    11:22 AM    [facility] [address] [place], [place], [address] Phone: [phone]   Fax:  [phone]  Name: JASYN MEY MRN: [id number] Date of Birth: Jan 07, 1971

## 2017-12-09 DIAGNOSIS — D631 Anemia in chronic kidney disease: Secondary | ICD-10-CM | POA: Diagnosis not present

## 2017-12-09 DIAGNOSIS — N186 End stage renal disease: Secondary | ICD-10-CM | POA: Diagnosis not present

## 2017-12-09 DIAGNOSIS — Z4931 Encounter for adequacy testing for hemodialysis: Secondary | ICD-10-CM | POA: Diagnosis not present

## 2017-12-09 DIAGNOSIS — R509 Fever, unspecified: Secondary | ICD-10-CM | POA: Diagnosis not present

## 2017-12-09 DIAGNOSIS — K7689 Other specified diseases of liver: Secondary | ICD-10-CM | POA: Diagnosis not present

## 2017-12-09 DIAGNOSIS — N2581 Secondary hyperparathyroidism of renal origin: Secondary | ICD-10-CM | POA: Diagnosis not present

## 2017-12-10 ENCOUNTER — Ambulatory Visit: Payer: Medicare Other | Admitting: Physical Therapy

## 2017-12-10 ENCOUNTER — Encounter: Payer: Self-pay | Admitting: Physical Therapy

## 2017-12-10 DIAGNOSIS — R293 Abnormal posture: Secondary | ICD-10-CM | POA: Diagnosis not present

## 2017-12-10 DIAGNOSIS — M6281 Muscle weakness (generalized): Secondary | ICD-10-CM | POA: Diagnosis not present

## 2017-12-10 DIAGNOSIS — R2689 Other abnormalities of gait and mobility: Secondary | ICD-10-CM

## 2017-12-10 DIAGNOSIS — R2681 Unsteadiness on feet: Secondary | ICD-10-CM

## 2017-12-11 DIAGNOSIS — K7689 Other specified diseases of liver: Secondary | ICD-10-CM | POA: Diagnosis not present

## 2017-12-11 DIAGNOSIS — N186 End stage renal disease: Secondary | ICD-10-CM | POA: Diagnosis not present

## 2017-12-11 DIAGNOSIS — R509 Fever, unspecified: Secondary | ICD-10-CM | POA: Diagnosis not present

## 2017-12-11 DIAGNOSIS — Z4931 Encounter for adequacy testing for hemodialysis: Secondary | ICD-10-CM | POA: Diagnosis not present

## 2017-12-11 DIAGNOSIS — D631 Anemia in chronic kidney disease: Secondary | ICD-10-CM | POA: Diagnosis not present

## 2017-12-11 DIAGNOSIS — N2581 Secondary hyperparathyroidism of renal origin: Secondary | ICD-10-CM | POA: Diagnosis not present

## 2017-12-11 NOTE — Therapy (Signed)
[facility] [address] [place], [place], [address] Phone: [phone]   Fax:  [phone]   PHYSICAL THERAPY DISCHARGE SUMMARY  Visits from Start of Care: 16  Current functional level related to goals / functional outcomes: See below   Remaining deficits: See below   Education / Equipment: Prosthetic care, HEP/use of fitness equipment.   Plan: Patient agrees to discharge.  Patient goals were met. Patient is being discharged due to meeting the stated rehab goals.  ?????          Physical Therapy Treatment  Patient Details  Name: Lance [name] MRN: [id number] Date of Birth: [birth year] Referring Provider (PT): [name], MD   Encounter Date: 12/10/2017  PT End of Session - 12/10/17 1828    Visit Number  16    Number of Visits  26    Date for PT Re-Evaluation  01/16/18    Authorization Type  Medicare & BCBS     Authorization Time Period  $3100 oop max has been met.  VL PT & OT combined 60 visits with zero met at PT eval    Authorization - Visit Number  16    Authorization - Number of Visits  60    PT Start Time  0930    PT Stop Time  1002    PT Time Calculation (min)  32 min    Activity Tolerance  Patient tolerated treatment well    Behavior During Therapy  WFL for tasks assessed/performed       Past Medical History:  Diagnosis Date  . Aortic stenosis    Very mild in 05/2007  . Arthritis    "right shoulder; right knee; feel like I've got it all over" (11/28/2015)  . CHF (congestive heart failure) ([place])   . Degenerative joint disease    Left TKA  . Diastolic heart failure (HCC)    LVEF 65-70% with grade 2 diastolic dysfunction  . ESRD (end stage renal disease) on dialysis [facility])    (as of 09/24/2017) pt does HD at home on a M/Tu/Th/F schedule.  . Essential hypertension, benign 2000   LVH  . GERD (gastroesophageal reflux disease)   . HCAP (healthcare-associated pneumonia) 11/28/2015  .  Hyperlipidemia 2000  . Loculated pleural effusion 11/28/2015   [name] 11/28/2015  . Pneumonia 12/2013; 05/2014  . PSVT (paroxysmal supraventricular tachycardia) (HCC)    Nonsustained; asymptomatic; diagnosed by event recorder in 2006  . Tobacco abuse, in remission    20 pack years; discontinued in 1985; bronchitic changes on chest x-ray and 2009  . Type 1 diabetes mellitus ([place]) 1990    Past Surgical History:  Procedure Laterality Date  . AMPUTATION Right 06/06/2017   Procedure: RIGHT BELOW KNEE AMPUTATION;  Surgeon: [name], MD;  Location: [facility];  Service: Orthopedics;  Laterality: Right;  . APPLICATION OF WOUND VAC Right 07/25/2017   Procedure: APPLICATION OF WOUND VAC;  Surgeon: [name], MD;  Location: [facility];  Service: Orthopedics;  Laterality: Right;  . AV FISTULA PLACEMENT Right 06/23/2014   Procedure: ARTERIOVENOUS (AV) FISTULA CREATION;  Surgeon: Angelia Mould, MD;  Location: [facility];  Service: Vascular;  Laterality: Right;  . CARDIAC CATHETERIZATION     [facility]- evaluation for Kidney/ pancreas transplant  . EYE SURGERY Bilateral    "for glaucoma"  . INGUINAL HERNIA REPAIR Right    As a child  . IR DIALY SHUNT INTRO NEEDLE/INTRACATH INITIAL W/IMG RIGHT Right 09/29/2017  .  LOWER EXTREMITY ANGIOGRAPHY N/A 05/27/2017   Procedure: LOWER EXTREMITY ANGIOGRAPHY;  Surgeon: [name], MD;  Location: [facility];  Service: Cardiovascular;  Laterality: N/A;  . STUMP REVISION Right 07/25/2017   Procedure: REVISION RIGHT BELOW KNEE AMPUTATION;;  Surgeon: [name], MD;  Location: [facility];  Service: Orthopedics;  Laterality: Right;  . WISDOM TOOTH EXTRACTION  1987    There were no vitals filed for this visit.  Subjective Assessment - 12/10/17 0930    Subjective  No falls. He wearing prosthesis all awake hours without issues. He was sore on lateral limb for ~2 days after being up at homecoming.     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 He wants to be active with 2 children (19yo & 16yo) who play sports.     Currently in Pain?  No/denies         [facility] PT Assessment - 12/10/17 0930      Transfers   Transfers  Sit to Stand;Stand to Sit;Floor to Transfer    Sit to Stand  7: Independent;Without upper extremity assist;From chair/3-in-1    Stand to Sit  7: Independent;Without upper extremity assist;To chair/3-in-1    Floor to Transfer  6: Modified independent (Device/Increase time);With upper extremity assist      Ambulation/Gait   Ambulation/Gait  Yes    Ambulation/Gait Assistance  7: Independent    Ambulation Distance (Feet)  1200 Feet    Assistive device  Prosthesis;None    Gait Pattern  Within Functional Limits    Ambulation Surface  Level;Unlevel;Indoor;Outdoor;Paved;Grass    Gait velocity  4.42 ft/sec no device except prosthesis   Initial GV was 1.37f/sec cane & 2.63 ft/sec RW   Stairs  Yes    Stairs Assistance  6: Modified independent (Device/Increase time)    Stair Management Technique  No rails;Alternating pattern;Forwards    Number of Stairs  12    Ramp  7: Independent   prosthesis only   Curb  7: Independent   prosthesis only     Standardized Balance Assessment   Standardized Balance Assessment  Berg Balance Test      Berg Balance Test   Sit to Stand  Able to stand without using hands and stabilize independently    Standing Unsupported  Able to stand safely 2 minutes    Sitting with Back Unsupported but Feet Supported on Floor or Stool  Able to sit safely and securely 2 minutes    Stand to Sit  Sits safely with minimal use of hands    Transfers  Able to transfer safely, minor use of hands    Standing Unsupported with Eyes Closed  Able to stand 10 seconds safely    Standing  Ubsupported with Feet Together  Able to place feet together independently and stand 1 minute safely    From Standing, Reach Forward with Outstretched Arm  Can reach confidently >25 cm (10")    From Standing Position, Pick up Object from Floor  Able to pick up shoe safely and easily    From Standing Position, Turn to Look Behind Over each Shoulder  Looks behind from both sides and weight shifts well    Turn 360 Degrees  Able  to turn 360 degrees safely in 4 seconds or less    Standing Unsupported, Alternately Place Feet on Step/Stool  Able to stand independently and safely and complete 8 steps in 20 seconds    Standing Unsupported, One Foot in Front  Able to plae foot ahead of the other independently and hold 30 seconds    Standing on One Leg  Able to lift leg independently and hold equal to or more than 3 seconds    Total Score  53    Berg comment:  Initial Berg 38/56      Functional Gait  Assessment   Gait assessed   Yes    Gait Level Surface  Walks 20 ft in less than 5.5 sec, no assistive devices, good speed, no evidence for imbalance, normal gait pattern, deviates no more than 6 in outside of the 12 in walkway width.    Change in Gait Speed  Able to smoothly change walking speed without loss of balance or gait deviation. Deviate no more than 6 in outside of the 12 in walkway width.    Gait with Horizontal Head Turns  Performs head turns smoothly with no change in gait. Deviates no more than 6 in outside 12 in walkway width    Gait with Vertical Head Turns  Performs head turns with no change in gait. Deviates no more than 6 in outside 12 in walkway width.    Gait and Pivot Turn  Pivot turns safely within 3 sec and stops quickly with no loss of balance.    Step Over Obstacle  Is able to step over 2 stacked shoe boxes taped together (9 in total height) without changing gait speed. No evidence of imbalance.    Gait with Narrow Base of Support  Ambulates less than 4 steps heel to toe or cannot  perform without assistance.    Gait with Eyes Closed  Walks 20 ft, no assistive devices, good speed, no evidence of imbalance, normal gait pattern, deviates no more than 6 in outside 12 in walkway width. Ambulates 20 ft in less than 7 sec.    Ambulating Backwards  Walks 20 ft, no assistive devices, good speed, no evidence for imbalance, normal gait    Steps  Alternating feet, no rail.    Total Score  27    FGA comment:  Initial FGA 7/30 with cane      Prosthetics Assessment - 12/10/17 0930      Prosthetics   Prosthetic Care Independent with  Skin check;Residual limb care;Care of non-amputated limb;Prosthetic cleaning;Ply sock cleaning;Correct ply sock adjustment;Proper wear schedule/adjustment;Proper weight-bearing schedule/adjustment    Donning prosthesis   Independent    Doffing prosthesis   Independent    Current prosthetic wear tolerance (days/week)   daily    Current prosthetic wear tolerance (#hours/day)   all awake hours with drying as needed    Current prosthetic weight-bearing tolerance (hours/day)   Pt tolerates standing >15 minutes without issues.     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 Pt return demo lifting & carrying 20# crate correctly.     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 Pt verbalized understanding.     Other Therapeutic Activities  PT demo pushing / pulling activities with wt shift between feet & prosthesis position. Pt return demo & verbalized understanding.                PT Short Term Goals -  11/17/17 0850      PT SHORT TERM GOAL #1   Title  Patient tolerates prosthesis wear >12hrs total /day without skin issues. (All STGs Target Date: 11/19/2017)    Baseline  11/17/17: goal met    Status  Achieved      PT SHORT TERM GOAL #2   Title  Patient verbalizes proper adjusting ply socks with limb volume changes.     Baseline  11/17/17: goal met    Status  Achieved      PT SHORT TERM GOAL #3   Title  Patient ambulates 300' with cane & prosthesis with supervision.     Baseline  11/17/17: met today    Time  --    Period  --    Status  Achieved      PT SHORT TERM GOAL #4   Title  Patient negotiates ramps, curbs & stairs single rail with cane with supervision.     Baseline  11/17/17: met today    Time  --    Period  --    Status  Achieved      PT SHORT TERM GOAL #5   Title  Patient is able to pick up 5# from floor with supervision.     Baseline  11/14/17: met today with 20# crate    Time  --    Period  --    Status  Achieved        PT Long Term Goals - 12/10/17 1824      PT LONG TERM GOAL #1   Title  Patient verbalizes & demonstrates understanding of prosthetic care to enable safe use of prosthesis. (All LTGs Target Date: 01/16/2018)    Baseline  MET 12/10/2017    Status  Achieved      PT LONG TERM GOAL #2   Title  Patient tolerates prosthesis wear >90% of awake hours without skin issues to enable function throughout the day.     Baseline  MET 12/10/2017    Status  Achieved      PT LONG TERM GOAL #3   Title  Berg Balance >/= 52/56 to indicate lower fall risk.     Baseline  MET 12/10/2017 Berg 53/56    Status  Achieved      PT LONG TERM GOAL #4   Title  Functional Gait Assessment >/= 24/30 with prosthesis only to indicate lower fall risk.     Baseline  MET 12/10/2017  FGA prosthesis only 27/30    Status  Achieved      PT LONG TERM GOAL #5   Title  Patient ambulates >1000' with prosthesis only outdoors including grass, ramps, curbs & stairs modified independent.      Baseline  MET 12/10/2017    Status  Achieved      PT LONG TERM GOAL #6   Title  Pt will perform job simulation tasks (climb ladder, lift and carry heavy items) safely, MOD I    Baseline  MET  12/10/2017    Time  1    Period  Months    Status  Achieved            Plan - 12/10/17 1828    Clinical Impression Statement  Patient met all LTGs. He is functioning with prosthesis only at community level. He appears to understand prosthetic care & use issues including dialysis weights and tolerates wear most of awake hours. Patient improved Berg Balance from 38/56 to 53/56 indicating mimimal fall risk. He improved Functional Gait Assessment improved from 7/30 with cane to 27/30 with prosthesis only which again indicates minimal fall risk. He demonstrates ability to perform housework & yardwork along with lifting, carrying & climbing A-frame ladder with prosthesis safely.     Rehab Potential  Good    PT Frequency  2x / week    PT Duration  Other (comment)   13 weeks (90 days)   PT Treatment/Interventions  ADLs/Self Care Home Management;Canalith Repostioning;DME Instruction;Gait training;Stair training;Functional mobility training;Therapeutic activities;Therapeutic exercise;Balance training;Neuromuscular re-education;Patient/family education;Prosthetic Training;Vestibular    PT Next Visit Plan  discharge PT    Consulted and Agree with Plan of Care  Patient       Patient will benefit from skilled therapeutic intervention in order to improve the following deficits and impairments:  Abnormal gait, Decreased activity tolerance, Decreased balance, Decreased endurance, Decreased mobility, Decreased skin integrity, Decreased strength, Dizziness, Impaired flexibility, Postural dysfunction, Prosthetic Dependency  Visit Diagnosis: Unsteadiness on feet  Other abnormalities of gait and mobility  Muscle weakness (generalized)  Abnormal posture     Problem List Patient Active Problem List    Diagnosis Date Noted  . Type 1 diabetes mellitus with other circulatory complications ([place]) 88/91/6945  . Cardiac arrest ([place]) 09/24/2017  . Acute respiratory failure with hypoxia ([place]) 08/07/2017  . Respiratory distress 08/07/2017  . Dehiscence of amputation stump ([place]) 07/17/2017  . Benign essential HTN   . Chronic diastolic (congestive) heart failure ([place])   . Labile blood glucose   . Labile blood pressure   . Vomiting   . Phantom limb pain ([place])   . Poorly controlled type 2 diabetes mellitus with peripheral neuropathy ([place])   . Unilateral complete BKA, right, sequela ([place])   . Amputation of right lower extremity below knee ([place]) 06/10/2017  . ESRD on dialysis ([place])   . Chronic diastolic congestive heart failure ([place])   . History of PSVT (paroxysmal supraventricular tachycardia)   . Diabetes mellitus type 2 in nonobese (HCC)   . Post-operative pain   . Acute blood loss anemia   . Anemia of chronic disease   . Leukocytosis   . Tachycardia   . History of right below knee amputation ([place]) 06/06/2017  . PVD (peripheral vascular disease) ([place]) 05/13/2017  . Bilateral pleural effusion 11/28/2015  . Loculated pleural effusion 11/28/2015  . HCAP (healthcare-associated pneumonia) 11/28/2015  . ESRD (end stage renal disease) on dialysis ([place]) 11/12/2015  . Hyperkalemia 11/12/2015  . Shoulder pain, acute 11/12/2015  . Nausea & vomiting 11/12/2015  . Diarrhea 11/12/2015  . Hypertensive urgency 05/05/2014  . Nephrotic syndrome 12/29/2013  . Hyponatremia 12/28/2013  . Acute diastolic heart failure ([place]) 12/28/2013  . Hypoglycemia 12/28/2013  . Dyspnea   . Acute renal failure syndrome ([place])   . Diabetic ketoacidosis without coma associated with type 1 diabetes mellitus ([place])   . Demand ischemia ([place]) 12/24/2013  . DKA (diabetic ketoacidoses) ([place]) 12/23/2013  . CAP (community acquired pneumonia) 12/23/2013  . Sepsis due to pneumonia ([place])  12/23/2013  . Metabolic acidemia 18/34/3735   . Acute renal failure ([place]) 12/23/2013  . Diabetic ketoacidosis ([place]) 12/23/2013  . Chronic diastolic heart failure ([place]) 04/14/2013  . PSVT (paroxysmal supraventricular tachycardia) ([place]) 04/14/2013  . Bilateral leg edema 03/26/2013  . Chronic kidney disease, stage III (moderate) ([place]) 09/29/2011  . Chest pain 09/26/2010  . Type 1 diabetes mellitus with end-stage renal disease ([place]) 09/26/2010  . Essential hypertension, benign 09/26/2010  . Mixed hyperlipidemia 09/26/2010    Reed Dady PT, DPT 12/11/2017, 8:35 AM  [facility] [address] [place], [place], [address] Phone: [phone]   Fax:  [phone]  Name: Lance [name] MRN: [id number] Date of Birth: October 24, 1970

## 2017-12-12 DIAGNOSIS — R17 Unspecified jaundice: Secondary | ICD-10-CM | POA: Diagnosis not present

## 2017-12-12 DIAGNOSIS — R509 Fever, unspecified: Secondary | ICD-10-CM | POA: Diagnosis not present

## 2017-12-12 DIAGNOSIS — D631 Anemia in chronic kidney disease: Secondary | ICD-10-CM | POA: Diagnosis not present

## 2017-12-12 DIAGNOSIS — E8779 Other fluid overload: Secondary | ICD-10-CM | POA: Diagnosis not present

## 2017-12-12 DIAGNOSIS — K7689 Other specified diseases of liver: Secondary | ICD-10-CM | POA: Diagnosis not present

## 2017-12-12 DIAGNOSIS — Z4931 Encounter for adequacy testing for hemodialysis: Secondary | ICD-10-CM | POA: Diagnosis not present

## 2017-12-12 DIAGNOSIS — N2581 Secondary hyperparathyroidism of renal origin: Secondary | ICD-10-CM | POA: Diagnosis not present

## 2017-12-12 DIAGNOSIS — E440 Moderate protein-calorie malnutrition: Secondary | ICD-10-CM | POA: Diagnosis not present

## 2017-12-12 DIAGNOSIS — Z79899 Other long term (current) drug therapy: Secondary | ICD-10-CM | POA: Diagnosis not present

## 2017-12-12 DIAGNOSIS — E1022 Type 1 diabetes mellitus with diabetic chronic kidney disease: Secondary | ICD-10-CM | POA: Diagnosis not present

## 2017-12-12 DIAGNOSIS — N186 End stage renal disease: Secondary | ICD-10-CM | POA: Diagnosis not present

## 2017-12-12 DIAGNOSIS — Z992 Dependence on renal dialysis: Secondary | ICD-10-CM | POA: Diagnosis not present

## 2017-12-15 ENCOUNTER — Encounter: Payer: Medicare Other | Admitting: Physical Therapy

## 2017-12-15 DIAGNOSIS — D631 Anemia in chronic kidney disease: Secondary | ICD-10-CM | POA: Diagnosis not present

## 2017-12-15 DIAGNOSIS — Z79899 Other long term (current) drug therapy: Secondary | ICD-10-CM | POA: Diagnosis not present

## 2017-12-15 DIAGNOSIS — N2581 Secondary hyperparathyroidism of renal origin: Secondary | ICD-10-CM | POA: Diagnosis not present

## 2017-12-15 DIAGNOSIS — Z4931 Encounter for adequacy testing for hemodialysis: Secondary | ICD-10-CM | POA: Diagnosis not present

## 2017-12-15 DIAGNOSIS — N186 End stage renal disease: Secondary | ICD-10-CM | POA: Diagnosis not present

## 2017-12-15 DIAGNOSIS — R509 Fever, unspecified: Secondary | ICD-10-CM | POA: Diagnosis not present

## 2017-12-16 DIAGNOSIS — R509 Fever, unspecified: Secondary | ICD-10-CM | POA: Diagnosis not present

## 2017-12-16 DIAGNOSIS — Z79899 Other long term (current) drug therapy: Secondary | ICD-10-CM | POA: Diagnosis not present

## 2017-12-16 DIAGNOSIS — N2581 Secondary hyperparathyroidism of renal origin: Secondary | ICD-10-CM | POA: Diagnosis not present

## 2017-12-16 DIAGNOSIS — D631 Anemia in chronic kidney disease: Secondary | ICD-10-CM | POA: Diagnosis not present

## 2017-12-16 DIAGNOSIS — Z4931 Encounter for adequacy testing for hemodialysis: Secondary | ICD-10-CM | POA: Diagnosis not present

## 2017-12-16 DIAGNOSIS — N186 End stage renal disease: Secondary | ICD-10-CM | POA: Diagnosis not present

## 2017-12-17 ENCOUNTER — Encounter: Payer: Medicare Other | Admitting: Physical Therapy

## 2017-12-18 DIAGNOSIS — N2581 Secondary hyperparathyroidism of renal origin: Secondary | ICD-10-CM | POA: Diagnosis not present

## 2017-12-18 DIAGNOSIS — Z79899 Other long term (current) drug therapy: Secondary | ICD-10-CM | POA: Diagnosis not present

## 2017-12-18 DIAGNOSIS — D631 Anemia in chronic kidney disease: Secondary | ICD-10-CM | POA: Diagnosis not present

## 2017-12-18 DIAGNOSIS — Z4931 Encounter for adequacy testing for hemodialysis: Secondary | ICD-10-CM | POA: Diagnosis not present

## 2017-12-18 DIAGNOSIS — N186 End stage renal disease: Secondary | ICD-10-CM | POA: Diagnosis not present

## 2017-12-18 DIAGNOSIS — R509 Fever, unspecified: Secondary | ICD-10-CM | POA: Diagnosis not present

## 2017-12-19 DIAGNOSIS — D631 Anemia in chronic kidney disease: Secondary | ICD-10-CM | POA: Diagnosis not present

## 2017-12-19 DIAGNOSIS — N2581 Secondary hyperparathyroidism of renal origin: Secondary | ICD-10-CM | POA: Diagnosis not present

## 2017-12-19 DIAGNOSIS — Z4931 Encounter for adequacy testing for hemodialysis: Secondary | ICD-10-CM | POA: Diagnosis not present

## 2017-12-19 DIAGNOSIS — R509 Fever, unspecified: Secondary | ICD-10-CM | POA: Diagnosis not present

## 2017-12-19 DIAGNOSIS — N186 End stage renal disease: Secondary | ICD-10-CM | POA: Diagnosis not present

## 2017-12-19 DIAGNOSIS — Z79899 Other long term (current) drug therapy: Secondary | ICD-10-CM | POA: Diagnosis not present

## 2017-12-22 ENCOUNTER — Encounter: Payer: Medicare Other | Admitting: Physical Therapy

## 2017-12-22 DIAGNOSIS — D631 Anemia in chronic kidney disease: Secondary | ICD-10-CM | POA: Diagnosis not present

## 2017-12-22 DIAGNOSIS — R509 Fever, unspecified: Secondary | ICD-10-CM | POA: Diagnosis not present

## 2017-12-22 DIAGNOSIS — Z4931 Encounter for adequacy testing for hemodialysis: Secondary | ICD-10-CM | POA: Diagnosis not present

## 2017-12-22 DIAGNOSIS — Z79899 Other long term (current) drug therapy: Secondary | ICD-10-CM | POA: Diagnosis not present

## 2017-12-22 DIAGNOSIS — N2581 Secondary hyperparathyroidism of renal origin: Secondary | ICD-10-CM | POA: Diagnosis not present

## 2017-12-22 DIAGNOSIS — N186 End stage renal disease: Secondary | ICD-10-CM | POA: Diagnosis not present

## 2017-12-23 DIAGNOSIS — D631 Anemia in chronic kidney disease: Secondary | ICD-10-CM | POA: Diagnosis not present

## 2017-12-23 DIAGNOSIS — R509 Fever, unspecified: Secondary | ICD-10-CM | POA: Diagnosis not present

## 2017-12-23 DIAGNOSIS — N186 End stage renal disease: Secondary | ICD-10-CM | POA: Diagnosis not present

## 2017-12-23 DIAGNOSIS — Z79899 Other long term (current) drug therapy: Secondary | ICD-10-CM | POA: Diagnosis not present

## 2017-12-23 DIAGNOSIS — N2581 Secondary hyperparathyroidism of renal origin: Secondary | ICD-10-CM | POA: Diagnosis not present

## 2017-12-23 DIAGNOSIS — Z4931 Encounter for adequacy testing for hemodialysis: Secondary | ICD-10-CM | POA: Diagnosis not present

## 2017-12-24 ENCOUNTER — Encounter: Payer: Medicare Other | Admitting: Physical Therapy

## 2017-12-25 DIAGNOSIS — N2581 Secondary hyperparathyroidism of renal origin: Secondary | ICD-10-CM | POA: Diagnosis not present

## 2017-12-25 DIAGNOSIS — D631 Anemia in chronic kidney disease: Secondary | ICD-10-CM | POA: Diagnosis not present

## 2017-12-25 DIAGNOSIS — Z79899 Other long term (current) drug therapy: Secondary | ICD-10-CM | POA: Diagnosis not present

## 2017-12-25 DIAGNOSIS — Z4931 Encounter for adequacy testing for hemodialysis: Secondary | ICD-10-CM | POA: Diagnosis not present

## 2017-12-25 DIAGNOSIS — R509 Fever, unspecified: Secondary | ICD-10-CM | POA: Diagnosis not present

## 2017-12-25 DIAGNOSIS — N186 End stage renal disease: Secondary | ICD-10-CM | POA: Diagnosis not present

## 2017-12-26 DIAGNOSIS — R509 Fever, unspecified: Secondary | ICD-10-CM | POA: Diagnosis not present

## 2017-12-26 DIAGNOSIS — N2581 Secondary hyperparathyroidism of renal origin: Secondary | ICD-10-CM | POA: Diagnosis not present

## 2017-12-26 DIAGNOSIS — Z79899 Other long term (current) drug therapy: Secondary | ICD-10-CM | POA: Diagnosis not present

## 2017-12-26 DIAGNOSIS — N186 End stage renal disease: Secondary | ICD-10-CM | POA: Diagnosis not present

## 2017-12-26 DIAGNOSIS — Z4931 Encounter for adequacy testing for hemodialysis: Secondary | ICD-10-CM | POA: Diagnosis not present

## 2017-12-26 DIAGNOSIS — D631 Anemia in chronic kidney disease: Secondary | ICD-10-CM | POA: Diagnosis not present

## 2017-12-29 DIAGNOSIS — R509 Fever, unspecified: Secondary | ICD-10-CM | POA: Diagnosis not present

## 2017-12-29 DIAGNOSIS — Z79899 Other long term (current) drug therapy: Secondary | ICD-10-CM | POA: Diagnosis not present

## 2017-12-29 DIAGNOSIS — N2581 Secondary hyperparathyroidism of renal origin: Secondary | ICD-10-CM | POA: Diagnosis not present

## 2017-12-29 DIAGNOSIS — D631 Anemia in chronic kidney disease: Secondary | ICD-10-CM | POA: Diagnosis not present

## 2017-12-29 DIAGNOSIS — Z4931 Encounter for adequacy testing for hemodialysis: Secondary | ICD-10-CM | POA: Diagnosis not present

## 2017-12-29 DIAGNOSIS — N186 End stage renal disease: Secondary | ICD-10-CM | POA: Diagnosis not present

## 2017-12-30 ENCOUNTER — Encounter: Payer: Medicare Other | Admitting: Physical Therapy

## 2017-12-30 DIAGNOSIS — N186 End stage renal disease: Secondary | ICD-10-CM | POA: Diagnosis not present

## 2017-12-30 DIAGNOSIS — R509 Fever, unspecified: Secondary | ICD-10-CM | POA: Diagnosis not present

## 2017-12-30 DIAGNOSIS — D631 Anemia in chronic kidney disease: Secondary | ICD-10-CM | POA: Diagnosis not present

## 2017-12-30 DIAGNOSIS — N2581 Secondary hyperparathyroidism of renal origin: Secondary | ICD-10-CM | POA: Diagnosis not present

## 2017-12-30 DIAGNOSIS — Z4931 Encounter for adequacy testing for hemodialysis: Secondary | ICD-10-CM | POA: Diagnosis not present

## 2017-12-30 DIAGNOSIS — Z79899 Other long term (current) drug therapy: Secondary | ICD-10-CM | POA: Diagnosis not present

## 2017-12-31 ENCOUNTER — Ambulatory Visit: Payer: BLUE CROSS/BLUE SHIELD | Admitting: Family Medicine

## 2018-01-01 ENCOUNTER — Encounter: Payer: Medicare Other | Admitting: Physical Therapy

## 2018-01-01 DIAGNOSIS — D631 Anemia in chronic kidney disease: Secondary | ICD-10-CM | POA: Diagnosis not present

## 2018-01-01 DIAGNOSIS — N186 End stage renal disease: Secondary | ICD-10-CM | POA: Diagnosis not present

## 2018-01-01 DIAGNOSIS — N2581 Secondary hyperparathyroidism of renal origin: Secondary | ICD-10-CM | POA: Diagnosis not present

## 2018-01-01 DIAGNOSIS — Z4931 Encounter for adequacy testing for hemodialysis: Secondary | ICD-10-CM | POA: Diagnosis not present

## 2018-01-01 DIAGNOSIS — Z79899 Other long term (current) drug therapy: Secondary | ICD-10-CM | POA: Diagnosis not present

## 2018-01-01 DIAGNOSIS — R509 Fever, unspecified: Secondary | ICD-10-CM | POA: Diagnosis not present

## 2018-01-02 DIAGNOSIS — N2581 Secondary hyperparathyroidism of renal origin: Secondary | ICD-10-CM | POA: Diagnosis not present

## 2018-01-02 DIAGNOSIS — R509 Fever, unspecified: Secondary | ICD-10-CM | POA: Diagnosis not present

## 2018-01-02 DIAGNOSIS — Z79899 Other long term (current) drug therapy: Secondary | ICD-10-CM | POA: Diagnosis not present

## 2018-01-02 DIAGNOSIS — N186 End stage renal disease: Secondary | ICD-10-CM | POA: Diagnosis not present

## 2018-01-02 DIAGNOSIS — D631 Anemia in chronic kidney disease: Secondary | ICD-10-CM | POA: Diagnosis not present

## 2018-01-02 DIAGNOSIS — Z4931 Encounter for adequacy testing for hemodialysis: Secondary | ICD-10-CM | POA: Diagnosis not present

## 2018-01-05 ENCOUNTER — Encounter: Payer: Medicare Other | Admitting: Physical Therapy

## 2018-01-05 DIAGNOSIS — R509 Fever, unspecified: Secondary | ICD-10-CM | POA: Diagnosis not present

## 2018-01-05 DIAGNOSIS — Z79899 Other long term (current) drug therapy: Secondary | ICD-10-CM | POA: Diagnosis not present

## 2018-01-05 DIAGNOSIS — N186 End stage renal disease: Secondary | ICD-10-CM | POA: Diagnosis not present

## 2018-01-05 DIAGNOSIS — Z4931 Encounter for adequacy testing for hemodialysis: Secondary | ICD-10-CM | POA: Diagnosis not present

## 2018-01-05 DIAGNOSIS — D631 Anemia in chronic kidney disease: Secondary | ICD-10-CM | POA: Diagnosis not present

## 2018-01-05 DIAGNOSIS — N2581 Secondary hyperparathyroidism of renal origin: Secondary | ICD-10-CM | POA: Diagnosis not present

## 2018-01-06 DIAGNOSIS — N2581 Secondary hyperparathyroidism of renal origin: Secondary | ICD-10-CM | POA: Diagnosis not present

## 2018-01-06 DIAGNOSIS — R509 Fever, unspecified: Secondary | ICD-10-CM | POA: Diagnosis not present

## 2018-01-06 DIAGNOSIS — N186 End stage renal disease: Secondary | ICD-10-CM | POA: Diagnosis not present

## 2018-01-06 DIAGNOSIS — Z4931 Encounter for adequacy testing for hemodialysis: Secondary | ICD-10-CM | POA: Diagnosis not present

## 2018-01-06 DIAGNOSIS — D631 Anemia in chronic kidney disease: Secondary | ICD-10-CM | POA: Diagnosis not present

## 2018-01-06 DIAGNOSIS — Z79899 Other long term (current) drug therapy: Secondary | ICD-10-CM | POA: Diagnosis not present

## 2018-01-07 ENCOUNTER — Encounter: Payer: Medicare Other | Admitting: Physical Therapy

## 2018-01-08 DIAGNOSIS — N2581 Secondary hyperparathyroidism of renal origin: Secondary | ICD-10-CM | POA: Diagnosis not present

## 2018-01-08 DIAGNOSIS — R509 Fever, unspecified: Secondary | ICD-10-CM | POA: Diagnosis not present

## 2018-01-08 DIAGNOSIS — Z4931 Encounter for adequacy testing for hemodialysis: Secondary | ICD-10-CM | POA: Diagnosis not present

## 2018-01-08 DIAGNOSIS — N186 End stage renal disease: Secondary | ICD-10-CM | POA: Diagnosis not present

## 2018-01-08 DIAGNOSIS — D631 Anemia in chronic kidney disease: Secondary | ICD-10-CM | POA: Diagnosis not present

## 2018-01-08 DIAGNOSIS — Z79899 Other long term (current) drug therapy: Secondary | ICD-10-CM | POA: Diagnosis not present

## 2018-01-09 DIAGNOSIS — Z4931 Encounter for adequacy testing for hemodialysis: Secondary | ICD-10-CM | POA: Diagnosis not present

## 2018-01-09 DIAGNOSIS — N186 End stage renal disease: Secondary | ICD-10-CM | POA: Diagnosis not present

## 2018-01-09 DIAGNOSIS — Z79899 Other long term (current) drug therapy: Secondary | ICD-10-CM | POA: Diagnosis not present

## 2018-01-09 DIAGNOSIS — R509 Fever, unspecified: Secondary | ICD-10-CM | POA: Diagnosis not present

## 2018-01-09 DIAGNOSIS — D631 Anemia in chronic kidney disease: Secondary | ICD-10-CM | POA: Diagnosis not present

## 2018-01-09 DIAGNOSIS — N2581 Secondary hyperparathyroidism of renal origin: Secondary | ICD-10-CM | POA: Diagnosis not present

## 2018-01-11 DIAGNOSIS — E1022 Type 1 diabetes mellitus with diabetic chronic kidney disease: Secondary | ICD-10-CM | POA: Diagnosis not present

## 2018-01-11 DIAGNOSIS — N186 End stage renal disease: Secondary | ICD-10-CM | POA: Diagnosis not present

## 2018-01-11 DIAGNOSIS — Z992 Dependence on renal dialysis: Secondary | ICD-10-CM | POA: Diagnosis not present

## 2018-01-12 DIAGNOSIS — N186 End stage renal disease: Secondary | ICD-10-CM | POA: Diagnosis not present

## 2018-01-12 DIAGNOSIS — E440 Moderate protein-calorie malnutrition: Secondary | ICD-10-CM | POA: Diagnosis not present

## 2018-01-12 DIAGNOSIS — Z992 Dependence on renal dialysis: Secondary | ICD-10-CM | POA: Diagnosis not present

## 2018-01-12 DIAGNOSIS — N2581 Secondary hyperparathyroidism of renal origin: Secondary | ICD-10-CM | POA: Diagnosis not present

## 2018-01-12 DIAGNOSIS — E8779 Other fluid overload: Secondary | ICD-10-CM | POA: Diagnosis not present

## 2018-01-12 DIAGNOSIS — E1022 Type 1 diabetes mellitus with diabetic chronic kidney disease: Secondary | ICD-10-CM | POA: Diagnosis not present

## 2018-01-12 DIAGNOSIS — K7689 Other specified diseases of liver: Secondary | ICD-10-CM | POA: Diagnosis not present

## 2018-01-12 DIAGNOSIS — R17 Unspecified jaundice: Secondary | ICD-10-CM | POA: Diagnosis not present

## 2018-01-12 DIAGNOSIS — D631 Anemia in chronic kidney disease: Secondary | ICD-10-CM | POA: Diagnosis not present

## 2018-01-12 DIAGNOSIS — Z4931 Encounter for adequacy testing for hemodialysis: Secondary | ICD-10-CM | POA: Diagnosis not present

## 2018-01-12 DIAGNOSIS — R509 Fever, unspecified: Secondary | ICD-10-CM | POA: Diagnosis not present

## 2018-01-12 DIAGNOSIS — Z79899 Other long term (current) drug therapy: Secondary | ICD-10-CM | POA: Diagnosis not present

## 2018-01-13 DIAGNOSIS — Z79899 Other long term (current) drug therapy: Secondary | ICD-10-CM | POA: Diagnosis not present

## 2018-01-13 DIAGNOSIS — N186 End stage renal disease: Secondary | ICD-10-CM | POA: Diagnosis not present

## 2018-01-13 DIAGNOSIS — N2581 Secondary hyperparathyroidism of renal origin: Secondary | ICD-10-CM | POA: Diagnosis not present

## 2018-01-13 DIAGNOSIS — R509 Fever, unspecified: Secondary | ICD-10-CM | POA: Diagnosis not present

## 2018-01-13 DIAGNOSIS — Z4931 Encounter for adequacy testing for hemodialysis: Secondary | ICD-10-CM | POA: Diagnosis not present

## 2018-01-13 DIAGNOSIS — D631 Anemia in chronic kidney disease: Secondary | ICD-10-CM | POA: Diagnosis not present

## 2018-01-15 ENCOUNTER — Other Ambulatory Visit: Payer: Self-pay

## 2018-01-15 ENCOUNTER — Ambulatory Visit: Payer: BLUE CROSS/BLUE SHIELD | Admitting: "Endocrinology

## 2018-01-15 DIAGNOSIS — N186 End stage renal disease: Secondary | ICD-10-CM | POA: Diagnosis not present

## 2018-01-15 DIAGNOSIS — N2581 Secondary hyperparathyroidism of renal origin: Secondary | ICD-10-CM | POA: Diagnosis not present

## 2018-01-15 DIAGNOSIS — R509 Fever, unspecified: Secondary | ICD-10-CM | POA: Diagnosis not present

## 2018-01-15 DIAGNOSIS — Z4931 Encounter for adequacy testing for hemodialysis: Secondary | ICD-10-CM | POA: Diagnosis not present

## 2018-01-15 DIAGNOSIS — D631 Anemia in chronic kidney disease: Secondary | ICD-10-CM | POA: Diagnosis not present

## 2018-01-15 DIAGNOSIS — Z79899 Other long term (current) drug therapy: Secondary | ICD-10-CM | POA: Diagnosis not present

## 2018-01-16 ENCOUNTER — Other Ambulatory Visit: Payer: Self-pay

## 2018-01-16 DIAGNOSIS — N2581 Secondary hyperparathyroidism of renal origin: Secondary | ICD-10-CM | POA: Diagnosis not present

## 2018-01-16 DIAGNOSIS — Z79899 Other long term (current) drug therapy: Secondary | ICD-10-CM | POA: Diagnosis not present

## 2018-01-16 DIAGNOSIS — Z4931 Encounter for adequacy testing for hemodialysis: Secondary | ICD-10-CM | POA: Diagnosis not present

## 2018-01-16 DIAGNOSIS — N186 End stage renal disease: Secondary | ICD-10-CM | POA: Diagnosis not present

## 2018-01-16 DIAGNOSIS — R509 Fever, unspecified: Secondary | ICD-10-CM | POA: Diagnosis not present

## 2018-01-16 DIAGNOSIS — D631 Anemia in chronic kidney disease: Secondary | ICD-10-CM | POA: Diagnosis not present

## 2018-01-16 MED ORDER — INSULIN GLARGINE 100 UNITS/ML SOLOSTAR PEN: 30.0000 [IU] | PEN_INJECTOR | Freq: Every day | SUBCUTANEOUS | 2 refills | Status: DC

## 2018-01-19 ENCOUNTER — Other Ambulatory Visit: Payer: Self-pay | Admitting: "Endocrinology

## 2018-01-19 DIAGNOSIS — N186 End stage renal disease: Secondary | ICD-10-CM | POA: Diagnosis not present

## 2018-01-19 DIAGNOSIS — N2581 Secondary hyperparathyroidism of renal origin: Secondary | ICD-10-CM | POA: Diagnosis not present

## 2018-01-19 DIAGNOSIS — Z79899 Other long term (current) drug therapy: Secondary | ICD-10-CM | POA: Diagnosis not present

## 2018-01-19 DIAGNOSIS — E1022 Type 1 diabetes mellitus with diabetic chronic kidney disease: Secondary | ICD-10-CM | POA: Diagnosis not present

## 2018-01-19 DIAGNOSIS — R509 Fever, unspecified: Secondary | ICD-10-CM | POA: Diagnosis not present

## 2018-01-19 DIAGNOSIS — Z4931 Encounter for adequacy testing for hemodialysis: Secondary | ICD-10-CM | POA: Diagnosis not present

## 2018-01-19 DIAGNOSIS — D631 Anemia in chronic kidney disease: Secondary | ICD-10-CM | POA: Diagnosis not present

## 2018-01-20 DIAGNOSIS — Z79899 Other long term (current) drug therapy: Secondary | ICD-10-CM | POA: Diagnosis not present

## 2018-01-20 DIAGNOSIS — R509 Fever, unspecified: Secondary | ICD-10-CM | POA: Diagnosis not present

## 2018-01-20 DIAGNOSIS — Z4931 Encounter for adequacy testing for hemodialysis: Secondary | ICD-10-CM | POA: Diagnosis not present

## 2018-01-20 DIAGNOSIS — N2581 Secondary hyperparathyroidism of renal origin: Secondary | ICD-10-CM | POA: Diagnosis not present

## 2018-01-20 DIAGNOSIS — N186 End stage renal disease: Secondary | ICD-10-CM | POA: Diagnosis not present

## 2018-01-20 DIAGNOSIS — D631 Anemia in chronic kidney disease: Secondary | ICD-10-CM | POA: Diagnosis not present

## 2018-01-20 LAB — HGB A1C W/O EAG: HEMOGLOBIN A1C: 10.7 % — AB (ref 4.8–5.6)

## 2018-01-20 LAB — TSH: TSH: 0.697 u[IU]/mL (ref 0.450–4.500)

## 2018-01-20 LAB — T4, FREE: Free T4: 0.98 ng/dL (ref 0.82–1.77)

## 2018-01-21 ENCOUNTER — Ambulatory Visit: Payer: BLUE CROSS/BLUE SHIELD | Admitting: "Endocrinology

## 2018-01-22 DIAGNOSIS — N186 End stage renal disease: Secondary | ICD-10-CM | POA: Diagnosis not present

## 2018-01-22 DIAGNOSIS — N2581 Secondary hyperparathyroidism of renal origin: Secondary | ICD-10-CM | POA: Diagnosis not present

## 2018-01-22 DIAGNOSIS — R509 Fever, unspecified: Secondary | ICD-10-CM | POA: Diagnosis not present

## 2018-01-22 DIAGNOSIS — Z4931 Encounter for adequacy testing for hemodialysis: Secondary | ICD-10-CM | POA: Diagnosis not present

## 2018-01-22 DIAGNOSIS — D631 Anemia in chronic kidney disease: Secondary | ICD-10-CM | POA: Diagnosis not present

## 2018-01-22 DIAGNOSIS — Z79899 Other long term (current) drug therapy: Secondary | ICD-10-CM | POA: Diagnosis not present

## 2018-01-23 DIAGNOSIS — N186 End stage renal disease: Secondary | ICD-10-CM | POA: Diagnosis not present

## 2018-01-23 DIAGNOSIS — N2581 Secondary hyperparathyroidism of renal origin: Secondary | ICD-10-CM | POA: Diagnosis not present

## 2018-01-23 DIAGNOSIS — R509 Fever, unspecified: Secondary | ICD-10-CM | POA: Diagnosis not present

## 2018-01-23 DIAGNOSIS — Z4931 Encounter for adequacy testing for hemodialysis: Secondary | ICD-10-CM | POA: Diagnosis not present

## 2018-01-23 DIAGNOSIS — D631 Anemia in chronic kidney disease: Secondary | ICD-10-CM | POA: Diagnosis not present

## 2018-01-23 DIAGNOSIS — Z79899 Other long term (current) drug therapy: Secondary | ICD-10-CM | POA: Diagnosis not present

## 2018-01-26 DIAGNOSIS — Z4931 Encounter for adequacy testing for hemodialysis: Secondary | ICD-10-CM | POA: Diagnosis not present

## 2018-01-26 DIAGNOSIS — Z79899 Other long term (current) drug therapy: Secondary | ICD-10-CM | POA: Diagnosis not present

## 2018-01-26 DIAGNOSIS — N186 End stage renal disease: Secondary | ICD-10-CM | POA: Diagnosis not present

## 2018-01-26 DIAGNOSIS — R509 Fever, unspecified: Secondary | ICD-10-CM | POA: Diagnosis not present

## 2018-01-26 DIAGNOSIS — N2581 Secondary hyperparathyroidism of renal origin: Secondary | ICD-10-CM | POA: Diagnosis not present

## 2018-01-26 DIAGNOSIS — D631 Anemia in chronic kidney disease: Secondary | ICD-10-CM | POA: Diagnosis not present

## 2018-01-27 DIAGNOSIS — D631 Anemia in chronic kidney disease: Secondary | ICD-10-CM | POA: Diagnosis not present

## 2018-01-27 DIAGNOSIS — Z4931 Encounter for adequacy testing for hemodialysis: Secondary | ICD-10-CM | POA: Diagnosis not present

## 2018-01-27 DIAGNOSIS — N2581 Secondary hyperparathyroidism of renal origin: Secondary | ICD-10-CM | POA: Diagnosis not present

## 2018-01-27 DIAGNOSIS — N186 End stage renal disease: Secondary | ICD-10-CM | POA: Diagnosis not present

## 2018-01-27 DIAGNOSIS — R509 Fever, unspecified: Secondary | ICD-10-CM | POA: Diagnosis not present

## 2018-01-27 DIAGNOSIS — Z79899 Other long term (current) drug therapy: Secondary | ICD-10-CM | POA: Diagnosis not present

## 2018-01-28 DIAGNOSIS — D631 Anemia in chronic kidney disease: Secondary | ICD-10-CM | POA: Diagnosis not present

## 2018-01-28 DIAGNOSIS — R509 Fever, unspecified: Secondary | ICD-10-CM | POA: Diagnosis not present

## 2018-01-28 DIAGNOSIS — Z79899 Other long term (current) drug therapy: Secondary | ICD-10-CM | POA: Diagnosis not present

## 2018-01-28 DIAGNOSIS — N2581 Secondary hyperparathyroidism of renal origin: Secondary | ICD-10-CM | POA: Diagnosis not present

## 2018-01-28 DIAGNOSIS — N186 End stage renal disease: Secondary | ICD-10-CM | POA: Diagnosis not present

## 2018-01-28 DIAGNOSIS — Z4931 Encounter for adequacy testing for hemodialysis: Secondary | ICD-10-CM | POA: Diagnosis not present

## 2018-01-29 DIAGNOSIS — Z79899 Other long term (current) drug therapy: Secondary | ICD-10-CM | POA: Diagnosis not present

## 2018-01-29 DIAGNOSIS — N186 End stage renal disease: Secondary | ICD-10-CM | POA: Diagnosis not present

## 2018-01-29 DIAGNOSIS — Z4931 Encounter for adequacy testing for hemodialysis: Secondary | ICD-10-CM | POA: Diagnosis not present

## 2018-01-29 DIAGNOSIS — D631 Anemia in chronic kidney disease: Secondary | ICD-10-CM | POA: Diagnosis not present

## 2018-01-29 DIAGNOSIS — N2581 Secondary hyperparathyroidism of renal origin: Secondary | ICD-10-CM | POA: Diagnosis not present

## 2018-01-29 DIAGNOSIS — R509 Fever, unspecified: Secondary | ICD-10-CM | POA: Diagnosis not present

## 2018-01-30 DIAGNOSIS — N186 End stage renal disease: Secondary | ICD-10-CM | POA: Diagnosis not present

## 2018-02-02 DIAGNOSIS — N186 End stage renal disease: Secondary | ICD-10-CM | POA: Diagnosis not present

## 2018-02-02 DIAGNOSIS — Z4931 Encounter for adequacy testing for hemodialysis: Secondary | ICD-10-CM | POA: Diagnosis not present

## 2018-02-02 DIAGNOSIS — R509 Fever, unspecified: Secondary | ICD-10-CM | POA: Diagnosis not present

## 2018-02-02 DIAGNOSIS — N2581 Secondary hyperparathyroidism of renal origin: Secondary | ICD-10-CM | POA: Diagnosis not present

## 2018-02-02 DIAGNOSIS — D631 Anemia in chronic kidney disease: Secondary | ICD-10-CM | POA: Diagnosis not present

## 2018-02-02 DIAGNOSIS — Z79899 Other long term (current) drug therapy: Secondary | ICD-10-CM | POA: Diagnosis not present

## 2018-02-03 DIAGNOSIS — R509 Fever, unspecified: Secondary | ICD-10-CM | POA: Diagnosis not present

## 2018-02-03 DIAGNOSIS — Z4931 Encounter for adequacy testing for hemodialysis: Secondary | ICD-10-CM | POA: Diagnosis not present

## 2018-02-03 DIAGNOSIS — Z79899 Other long term (current) drug therapy: Secondary | ICD-10-CM | POA: Diagnosis not present

## 2018-02-03 DIAGNOSIS — D631 Anemia in chronic kidney disease: Secondary | ICD-10-CM | POA: Diagnosis not present

## 2018-02-03 DIAGNOSIS — N2581 Secondary hyperparathyroidism of renal origin: Secondary | ICD-10-CM | POA: Diagnosis not present

## 2018-02-03 DIAGNOSIS — N186 End stage renal disease: Secondary | ICD-10-CM | POA: Diagnosis not present

## 2018-02-05 DIAGNOSIS — Z79899 Other long term (current) drug therapy: Secondary | ICD-10-CM | POA: Diagnosis not present

## 2018-02-05 DIAGNOSIS — D631 Anemia in chronic kidney disease: Secondary | ICD-10-CM | POA: Diagnosis not present

## 2018-02-05 DIAGNOSIS — R509 Fever, unspecified: Secondary | ICD-10-CM | POA: Diagnosis not present

## 2018-02-05 DIAGNOSIS — Z4931 Encounter for adequacy testing for hemodialysis: Secondary | ICD-10-CM | POA: Diagnosis not present

## 2018-02-05 DIAGNOSIS — N2581 Secondary hyperparathyroidism of renal origin: Secondary | ICD-10-CM | POA: Diagnosis not present

## 2018-02-05 DIAGNOSIS — N186 End stage renal disease: Secondary | ICD-10-CM | POA: Diagnosis not present

## 2018-02-06 DIAGNOSIS — D631 Anemia in chronic kidney disease: Secondary | ICD-10-CM | POA: Diagnosis not present

## 2018-02-06 DIAGNOSIS — R509 Fever, unspecified: Secondary | ICD-10-CM | POA: Diagnosis not present

## 2018-02-06 DIAGNOSIS — N2581 Secondary hyperparathyroidism of renal origin: Secondary | ICD-10-CM | POA: Diagnosis not present

## 2018-02-06 DIAGNOSIS — N186 End stage renal disease: Secondary | ICD-10-CM | POA: Diagnosis not present

## 2018-02-06 DIAGNOSIS — Z79899 Other long term (current) drug therapy: Secondary | ICD-10-CM | POA: Diagnosis not present

## 2018-02-06 DIAGNOSIS — Z4931 Encounter for adequacy testing for hemodialysis: Secondary | ICD-10-CM | POA: Diagnosis not present

## 2018-02-09 DIAGNOSIS — N2581 Secondary hyperparathyroidism of renal origin: Secondary | ICD-10-CM | POA: Diagnosis not present

## 2018-02-09 DIAGNOSIS — Z79899 Other long term (current) drug therapy: Secondary | ICD-10-CM | POA: Diagnosis not present

## 2018-02-09 DIAGNOSIS — D631 Anemia in chronic kidney disease: Secondary | ICD-10-CM | POA: Diagnosis not present

## 2018-02-09 DIAGNOSIS — R509 Fever, unspecified: Secondary | ICD-10-CM | POA: Diagnosis not present

## 2018-02-09 DIAGNOSIS — Z4931 Encounter for adequacy testing for hemodialysis: Secondary | ICD-10-CM | POA: Diagnosis not present

## 2018-02-09 DIAGNOSIS — N186 End stage renal disease: Secondary | ICD-10-CM | POA: Diagnosis not present

## 2018-02-10 DIAGNOSIS — N2581 Secondary hyperparathyroidism of renal origin: Secondary | ICD-10-CM | POA: Diagnosis not present

## 2018-02-10 DIAGNOSIS — N186 End stage renal disease: Secondary | ICD-10-CM | POA: Diagnosis not present

## 2018-02-10 DIAGNOSIS — D631 Anemia in chronic kidney disease: Secondary | ICD-10-CM | POA: Diagnosis not present

## 2018-02-10 DIAGNOSIS — R509 Fever, unspecified: Secondary | ICD-10-CM | POA: Diagnosis not present

## 2018-02-10 DIAGNOSIS — Z4931 Encounter for adequacy testing for hemodialysis: Secondary | ICD-10-CM | POA: Diagnosis not present

## 2018-02-10 DIAGNOSIS — Z79899 Other long term (current) drug therapy: Secondary | ICD-10-CM | POA: Diagnosis not present

## 2018-02-12 DIAGNOSIS — Z992 Dependence on renal dialysis: Secondary | ICD-10-CM | POA: Diagnosis not present

## 2018-02-12 DIAGNOSIS — D509 Iron deficiency anemia, unspecified: Secondary | ICD-10-CM | POA: Diagnosis not present

## 2018-02-12 DIAGNOSIS — E1022 Type 1 diabetes mellitus with diabetic chronic kidney disease: Secondary | ICD-10-CM | POA: Diagnosis not present

## 2018-02-12 DIAGNOSIS — N2589 Other disorders resulting from impaired renal tubular function: Secondary | ICD-10-CM | POA: Diagnosis not present

## 2018-02-12 DIAGNOSIS — R509 Fever, unspecified: Secondary | ICD-10-CM | POA: Diagnosis not present

## 2018-02-12 DIAGNOSIS — Z4931 Encounter for adequacy testing for hemodialysis: Secondary | ICD-10-CM | POA: Diagnosis not present

## 2018-02-12 DIAGNOSIS — K7689 Other specified diseases of liver: Secondary | ICD-10-CM | POA: Diagnosis not present

## 2018-02-12 DIAGNOSIS — E8779 Other fluid overload: Secondary | ICD-10-CM | POA: Diagnosis not present

## 2018-02-12 DIAGNOSIS — N186 End stage renal disease: Secondary | ICD-10-CM | POA: Diagnosis not present

## 2018-02-12 DIAGNOSIS — D631 Anemia in chronic kidney disease: Secondary | ICD-10-CM | POA: Diagnosis not present

## 2018-02-12 DIAGNOSIS — N2581 Secondary hyperparathyroidism of renal origin: Secondary | ICD-10-CM | POA: Diagnosis not present

## 2018-02-13 DIAGNOSIS — N2581 Secondary hyperparathyroidism of renal origin: Secondary | ICD-10-CM | POA: Diagnosis not present

## 2018-02-13 DIAGNOSIS — R509 Fever, unspecified: Secondary | ICD-10-CM | POA: Diagnosis not present

## 2018-02-13 DIAGNOSIS — N186 End stage renal disease: Secondary | ICD-10-CM | POA: Diagnosis not present

## 2018-02-13 DIAGNOSIS — D631 Anemia in chronic kidney disease: Secondary | ICD-10-CM | POA: Diagnosis not present

## 2018-02-13 DIAGNOSIS — N2589 Other disorders resulting from impaired renal tubular function: Secondary | ICD-10-CM | POA: Diagnosis not present

## 2018-02-13 DIAGNOSIS — D509 Iron deficiency anemia, unspecified: Secondary | ICD-10-CM | POA: Diagnosis not present

## 2018-02-16 ENCOUNTER — Ambulatory Visit (INDEPENDENT_AMBULATORY_CARE_PROVIDER_SITE_OTHER): Payer: Medicare Other | Admitting: "Endocrinology

## 2018-02-16 ENCOUNTER — Encounter: Payer: Self-pay | Admitting: "Endocrinology

## 2018-02-16 ENCOUNTER — Ambulatory Visit (INDEPENDENT_AMBULATORY_CARE_PROVIDER_SITE_OTHER): Payer: BLUE CROSS/BLUE SHIELD | Admitting: Orthopedic Surgery

## 2018-02-16 VITALS — BP 129/85 | HR 82 | Ht 75.0 in | Wt 231.0 lb

## 2018-02-16 DIAGNOSIS — N2589 Other disorders resulting from impaired renal tubular function: Secondary | ICD-10-CM | POA: Diagnosis not present

## 2018-02-16 DIAGNOSIS — E782 Mixed hyperlipidemia: Secondary | ICD-10-CM

## 2018-02-16 DIAGNOSIS — E1022 Type 1 diabetes mellitus with diabetic chronic kidney disease: Secondary | ICD-10-CM | POA: Diagnosis not present

## 2018-02-16 DIAGNOSIS — N2581 Secondary hyperparathyroidism of renal origin: Secondary | ICD-10-CM | POA: Diagnosis not present

## 2018-02-16 DIAGNOSIS — E1059 Type 1 diabetes mellitus with other circulatory complications: Secondary | ICD-10-CM | POA: Diagnosis not present

## 2018-02-16 DIAGNOSIS — N186 End stage renal disease: Secondary | ICD-10-CM

## 2018-02-16 DIAGNOSIS — D509 Iron deficiency anemia, unspecified: Secondary | ICD-10-CM | POA: Diagnosis not present

## 2018-02-16 DIAGNOSIS — D631 Anemia in chronic kidney disease: Secondary | ICD-10-CM | POA: Diagnosis not present

## 2018-02-16 DIAGNOSIS — R509 Fever, unspecified: Secondary | ICD-10-CM | POA: Diagnosis not present

## 2018-02-16 DIAGNOSIS — I10 Essential (primary) hypertension: Secondary | ICD-10-CM | POA: Diagnosis not present

## 2018-02-16 MED ORDER — GLUCOSE BLOOD VI STRP: ORAL_STRIP | 2 refills | Status: DC

## 2018-02-16 NOTE — Patient Instructions (Signed)

## 2018-02-16 NOTE — Progress Notes (Signed)
Endocrinology follow-up note       02/16/2018, 2:49 PM   Subjective:    Patient ID: Lance [name], male    DOB: Jun 02, 1970.  Lance [name] is being seen in follow-up  for management of currently uncontrolled, complicated, symptomatic type 1 diabetes, hyperlipidemia, hypertension. PMD:   [name], MD.   Past Medical History:  Diagnosis Date  . Aortic stenosis    Very mild in 05/2007  . Arthritis    "right shoulder; right knee; feel like I've got it all over" (11/28/2015)  . CHF (congestive heart failure) ([place])   . Degenerative joint disease    Left TKA  . Diastolic heart failure (HCC)    LVEF 65-70% with grade 2 diastolic dysfunction  . ESRD (end stage renal disease) on dialysis [facility])    (as of 09/24/2017) pt does HD at home on a M/Tu/Th/F schedule.  . Essential hypertension, benign 2000   LVH  . GERD (gastroesophageal reflux disease)   . HCAP (healthcare-associated pneumonia) 11/28/2015  . Hyperlipidemia 2000  . Loculated pleural effusion 11/28/2015   [name] 11/28/2015  . Pneumonia 12/2013; 05/2014  . PSVT (paroxysmal supraventricular tachycardia) (HCC)    Nonsustained; asymptomatic; diagnosed by event recorder in 2006  . Tobacco abuse, in remission    20 pack years; discontinued in 1985; bronchitic changes on chest x-ray and 2009  . Type 1 diabetes mellitus ([place]) 1990   Past Surgical History:  Procedure Laterality Date  . AMPUTATION Right 06/06/2017   Procedure: RIGHT BELOW KNEE AMPUTATION;  Surgeon: [name], MD;  Location: [place];  Service: Orthopedics;  Laterality: Right;  . APPLICATION OF WOUND VAC Right 07/25/2017   Procedure: APPLICATION OF WOUND VAC;  Surgeon: [name], MD;  Location: [place];  Service: Orthopedics;  Laterality: Right;  . AV FISTULA PLACEMENT Right 06/23/2014   Procedure: ARTERIOVENOUS (AV) FISTULA CREATION;  Surgeon: Angelia Mould, MD;   Location: [place];  Service: Vascular;  Laterality: Right;  . CARDIAC CATHETERIZATION     [facility]- evaluation for Kidney/ pancreas transplant  . EYE SURGERY Bilateral    "for glaucoma"  . INGUINAL HERNIA REPAIR Right    As a child  . IR DIALY SHUNT INTRO NEEDLE/INTRACATH INITIAL W/IMG RIGHT Right 09/29/2017  . LOWER EXTREMITY ANGIOGRAPHY N/A 05/27/2017   Procedure: LOWER EXTREMITY ANGIOGRAPHY;  Surgeon: [name], MD;  Location: [place] [facility];  Service: Cardiovascular;  Laterality: N/A;  . STUMP REVISION Right 07/25/2017   Procedure: REVISION RIGHT BELOW KNEE AMPUTATION;;  Surgeon: [name], MD;  Location: [place];  Service: Orthopedics;  Laterality: Right;  . WISDOM TOOTH EXTRACTION  1987   Social History   Socioeconomic History  . Marital status: Married    Spouse name: Not on file  . Number of children: Not on file  . Years of education: Not on file  . Highest education level: Not on file  Occupational History  . Not on file  Social Needs  . Financial resource strain: Not on file  . Food insecurity:    Worry: Not on file    Inability: Not  on file  . Transportation needs:    Medical: Not on file    Non-medical: Not on file  Tobacco Use  . Smoking status: Never Smoker  . Smokeless tobacco: Never Used  Substance and Sexual Activity  . Alcohol use: Yes    Comment: 11/28/2015 "used to drink; stopped ~ 2 yr ago"  . Drug use: No  . Sexual activity: Yes  Lifestyle  . Physical activity:    Days per week: Not on file    Minutes per session: Not on file  . Stress: Not on file  Relationships  . Social connections:    Talks on phone: Not on file    Gets together: Not on file    Attends religious service: Not on file    Active member of club or organization: Not on file    Attends meetings of clubs or organizations: Not on file    Relationship status: Not on file  Other Topics Concern  . Not on file  Social History Narrative  . Not on file   Outpatient Encounter  Medications as of 02/16/2018  Medication Sig  . acetaminophen (TYLENOL) 500 MG tablet Take 1,000 mg by mouth every 6 (six) hours as needed (for pain).  Marland Kitchen aspirin EC 325 MG EC tablet Take 1 tablet (325 mg total) by mouth daily.  . calcitRIOL (ROCALTROL) 0.25 MCG capsule Take 1 capsule (0.25 mcg total) by mouth every Monday, Wednesday, and Friday with hemodialysis. (Patient taking differently: Take 0.25 mcg by mouth See admin instructions. Take 0.25 mcg by mouth once a day on Mon/Tues/Thurs/Fri with home dialysis)  . carvedilol (COREG) 3.125 MG tablet Take 1 tablet (3.125 mg total) by mouth 2 (two) times daily with a meal. (Patient taking differently: Take 6.25 mg by mouth 2 (two) times daily with a meal. )  . cloNIDine (CATAPRES) 0.1 MG tablet Take 0.1 mg by mouth daily.   . Continuous Blood Gluc Sensor (FREESTYLE LIBRE 14 DAY SENSOR) MISC 1 each by Does not apply route every 14 (fourteen) days.  . Continuous Blood Gluc Sensor (FREESTYLE LIBRE SENSOR SYSTEM) MISC APPLY 1 SENSOR TO THE SKIN EVERY 10 DAYS  . docusate sodium (COLACE) 100 MG capsule Take 1 capsule (100 mg total) by mouth 2 (two) times daily. (Patient taking differently: Take 200 mg by mouth daily as needed (when taking pain meds). )  . glucose blood (ACCU-CHEK GUIDE) test strip Use as instructed  . insulin glargine (LANTUS) 100 unit/mL SOPN Inject 0.3 mLs (30 Units total) into the skin at bedtime.  . insulin lispro (HUMALOG) 100 UNIT/ML KwikPen Inject 15-21 Units into the skin 3 (three) times daily before meals.  Marland Kitchen lisinopril (PRINIVIL,ZESTRIL) 40 MG tablet Take 40 mg by mouth daily.   . ondansetron (ZOFRAN ODT) 4 MG disintegrating tablet Take 1 tablet (4 mg total) by mouth every 8 (eight) hours as needed for nausea or vomiting.  . pantoprazole (PROTONIX) 40 MG tablet TAKE 1 TABLET BY MOUTH EVERY DAY (Patient taking differently: Take 40 mg by mouth daily. )  . polyethylene glycol (MIRALAX) packet Take 17 g by mouth daily. (Patient taking  differently: Take 17 g by mouth See admin instructions. Take 17 grams by mouth, mixed, once a day only when taking pain meds)  . psyllium (HYDROCIL/METAMUCIL) 95 % PACK Take 1 packet by mouth daily.  . sucroferric oxyhydroxide (VELPHORO) 500 MG chewable tablet Chew 3 tablets (1,500 mg total) by mouth 3 (three) times daily.  . [DISCONTINUED] insulin lispro (HUMALOG KWIKPEN)  100 UNIT/ML KiwkPen Inject 0.15-0.21 mLs (15-21 Units total) into the skin See admin instructions. Sliding scale three times daily   No facility-administered encounter medications on file as of 02/16/2018.     ALLERGIES: Allergies  Allergen Reactions  . Atorvastatin Other (See Comments)    Muscle pain   . Minoxidil Other (See Comments)    Fluid retention   . Adhesive [Tape] Other (See Comments)    MUST USE "SILK TAPE," AS ANY OTHER "BURNS" THE SKIN  . Statins Other (See Comments)    Muscle pain    VACCINATION STATUS: Immunization History  Administered Date(s) Administered  . Influenza,inj,Quad PF,6+ Mos 05/06/2014  . Pneumococcal Polysaccharide-23 05/06/2014    Diabetes  He presents for his follow-up diabetic visit. He has type 1 diabetes mellitus. Onset time: He was diagnosed at approximate age of 33 years with type 1 diabetes. His disease course has been worsening (Since his last visit, he was hospitalized for cardiac arrest which reportedly happened because of hyperkalemia from defective dialysis machine.). There are no hypoglycemic associated symptoms. Pertinent negatives for hypoglycemia include no confusion, headaches, pallor or seizures. Pertinent negatives for diabetes include no chest pain, no fatigue, no polydipsia, no polyphagia, no polyuria and no weakness. There are no hypoglycemic complications. Symptoms are improving. Diabetic complications include nephropathy and PVD. (His diabetes is complicated by end-stage renal disease on  hemodialysis for the last 5 years.  Since last visit, he underwent right  below-knee amputation due to peripheral arterial disease.  Patient is  on  Kidney transplant list.) Risk factors for coronary artery disease include diabetes mellitus, hypertension and sedentary lifestyle. Current diabetic treatment includes insulin injections. His weight is increasing steadily (Status post right BKA.). He is following a generally unhealthy diet. He has had a previous visit with a dietitian. He never participates in exercise. His home blood glucose trend is decreasing steadily. ( -Once with incomplete use of his CGM, no logs on his insulin administration.  His A1c is higher at 10.7% increasing from 6.7%.   ) An ACE inhibitor/angiotensin II receptor blocker is being taken. Eye exam is current.  Hypertension  This is a chronic problem. The current episode started more than 1 year ago. The problem is controlled. Pertinent negatives include no chest pain, headaches, neck pain, palpitations or shortness of breath. Risk factors for coronary artery disease include diabetes mellitus and sedentary lifestyle. Past treatments include ACE inhibitors. Hypertensive end-organ damage includes kidney disease and PVD.   Review of Systems  Constitutional: Negative for chills, fatigue, fever and unexpected weight change.  HENT: Negative for dental problem, mouth sores and trouble swallowing.   Eyes: Negative for visual disturbance.  Respiratory: Negative for cough, choking, chest tightness, shortness of breath and wheezing.   Cardiovascular: Negative for chest pain, palpitations and leg swelling.  Gastrointestinal: Negative for abdominal distention, abdominal pain, constipation, diarrhea, nausea and vomiting.  Endocrine: Negative for polydipsia, polyphagia and polyuria.  Genitourinary: Negative for dysuria, flank pain, hematuria and urgency.  Musculoskeletal: Negative for back pain, gait problem, myalgias and neck pain.  Skin: Negative for pallor, rash and wound.  Neurological: Negative for seizures,  syncope, weakness, numbness and headaches.  Psychiatric/Behavioral: Negative for confusion and dysphoric mood.    Objective:    BP 129/85   Pulse 82   Ht 6\' 3"  (1.905 m)   Wt 231 lb (104.8 kg)   BMI 28.87 kg/m   Wt Readings from Last 3 Encounters:  02/16/18 231 lb (104.8 kg)  11/10/17 221  lb (100.2 kg)  10/23/17 221 lb (100.2 kg)     Physical Exam  Constitutional: He is oriented to person, place, and time. He appears well-developed. He is cooperative. No distress.  HENT:  Head: Normocephalic and atraumatic.  Eyes: EOM are normal.  Neck: Normal range of motion. Neck supple. No tracheal deviation present. No thyromegaly present.  Cardiovascular: Normal rate, S1 normal and S2 normal. Exam reveals no gallop.  No murmur heard. Pulses:      Dorsalis pedis pulses are 1+ on the right side and 1+ on the left side.       Posterior tibial pulses are 1+ on the right side and 1+ on the left side.  Pulmonary/Chest: Effort normal. No respiratory distress. He has no wheezes.  Abdominal: He exhibits no distension. There is no abdominal tenderness. There is no guarding and no CVA tenderness.  Musculoskeletal:        General: No edema.     Right shoulder: He exhibits no swelling and no deformity.     Comments: Patient on a wheelchair due to right below-knee amputation from peripheral arterial disease as a complication of diabetes.   Neurological: He is alert and oriented to person, place, and time. He has normal strength. No cranial nerve deficit or sensory deficit. Gait normal.  Skin: Skin is warm and dry. No rash noted. No cyanosis. Nails show no clubbing.  Psychiatric: He has a normal mood and affect. His speech is normal and behavior is normal. Cognition and memory are normal.   CMP ( most recent) CMP     Component Value Date/Time   NA 133 (L) 09/30/2017 0510   NA 131 (L) 02/18/2017 1438   K 3.7 09/30/2017 0510   CL 97 (L) 09/30/2017 0510   CO2 27 09/30/2017 0510   GLUCOSE 170 (H)  09/30/2017 0510   BUN 40 (H) 09/30/2017 0510   BUN 46 (H) 02/18/2017 1438   CREATININE 9.38 (H) 09/30/2017 0510   CREATININE 2.21 (H) 03/26/2013 1520   CALCIUM 8.6 (L) 09/30/2017 0510   PROT 6.9 09/26/2017 0350   PROT 8.3 02/18/2017 1438   ALBUMIN 2.8 (L) 09/30/2017 0510   ALBUMIN 4.4 02/18/2017 1438   AST 39 09/26/2017 0350   ALT 43 09/26/2017 0350   ALKPHOS 94 09/26/2017 0350   BILITOT 0.8 09/26/2017 0350   BILITOT 0.5 02/18/2017 1438   GFRNONAA 6 (L) 09/30/2017 0510   GFRAA 7 (L) 09/30/2017 0510     Diabetic Labs (most recent): Lab Results  Component Value Date   HGBA1C 10.7 (H) 01/19/2018   HGBA1C 6.7 (H) 07/25/2017   HGBA1C 9.1 (H) 02/18/2017     Lipid Panel ( most recent) Lipid Panel     Component Value Date/Time   CHOL 174 12/28/2013 0707   TRIG 102 09/25/2017 0450   HDL 39 (L) 12/28/2013 0707   CHOLHDL 4.5 12/28/2013 0707   VLDL 46 (H) 12/28/2013 0707   LDLCALC 89 12/28/2013 0707      Lab Results  Component Value Date   TSH 0.697 01/19/2018   TSH 1.390 02/18/2017   TSH 1.760 12/26/2013   FREET4 0.98 01/19/2018   FREET4 1.00 02/18/2017     Assessment & Plan:   1. Type 1 diabetes mellitus with end-stage renal disease ([place])  - Lance [name] has currently uncontrolled symptomatic type 1 DM since  48 years of age.  -His diabetes is complicated by peripheral arterial disease status post right BKA, ESRD currently on hemodialysis, recent  hospitalization for cardiac arrest due to hyperkalemia which resulted from defective dialysis machine.   -He returns with worsening A1c of 10.7%, increasing from 6.7%.  -Average blood glucose for the last 7 days in his CGM is 440..    -his diabetes is complicated by end-stage renal disease on hemodialysis/kidney transplant list, PAD, sedentary life and MOHSEN ODENTHAL [name] remains at a high risk for more acute and chronic complications which include CAD, CVA, retinopathy, and neuropathy. These are all discussed in  detail with the patient.  - I have counseled him on diet management  by adopting a carbohydrate restricted/protein rich diet.  -  Suggestion is made for him to avoid simple carbohydrates  from his diet including Cakes, Sweet Desserts / Pastries, Ice Cream, Soda (diet and regular), Sweet Tea, Candies, Chips, Cookies, Store Bought Juices, Alcohol in Excess of  1-2 drinks a day, Artificial Sweeteners, and "Sugar-free" Products. This will help patient to have stable blood glucose profile and potentially avoid unintended weight gain.   - I encouraged him to switch to  unprocessed or minimally processed complex starch and increased protein intake (animal or plant source), fruits, and vegetables.  - he is advised to stick to a routine mealtimes to eat 3 meals  a day and avoid unnecessary snacks ( to snack only to correct hypoglycemia).   - he has been scheduled with Jearld Fenton, RDN, CDE for individualized diabetes education- consult pending.  - I have approached him with the following individualized plan to manage diabetes and patient agrees:   -Unfortunately, he disengaged from self care of late.   He is approached for better engagement, use of his CGM device at all times.  He will be resumed on his intensive treatment with basal/bolus insulin.   -He is advised to resume and continue Basaglar 30 units nightly, resume Humalog 15 units 3 times a day with meals for pre-meal BG readings of 70-150mg /dl, plus patient specific correction dose for unexpected hyperglycemia above 150mg /dl, associated with strict documenting of  glucose 4 times a day-before meals and at bedtime. - Patient is warned not to take insulin without proper monitoring per orders. -Adjustment parameters are given for hypo and hyperglycemia in writing. -Patient is encouraged to call clinic for blood glucose levels less than 70 or above 300 mg /dl.  - Insulin is the only choice of therapy for him. He is benefiting from CGM, I advised  him to wear it at all times.   - Patient specific target  A1c;  LDL, HDL, Triglycerides, and  Waist Circumference were discussed in detail.  2) BP/HTN: His blood pressure is not controlled to target.  He is advised to continue his current blood pressure medications including Coreg 3.125 mg p.o. twice daily, lisinopril 40 mg p.o. Daily.   3) Lipids/HPL: Recent  Lipid panel   shows LDL at 89. He  is marked as allergic to statins.   4)  Weight/Diet: CDE Consult has been  initiated , exercise, and detailed carbohydrates information provided.  5) Chronic Care/Health Maintenance:  -he  is on ACEI medications and  is encouraged to continue to follow up with Ophthalmology, Dentist, nephrology,  Podiatrist at least yearly or according to recommendations, and advised to  stay away from smoking. I have recommended yearly flu vaccine and pneumonia vaccination at least every 5 years; moderate intensity exercise for up to 150 minutes weekly; and  sleep for at least 7 hours a day.  - I advised patient to maintain close  follow up with [name], MD for primary care needs.  - Time spent with the patient: 25 min, of which >50% was spent in reviewing his blood glucose logs , discussing his hypo- and hyper-glycemic episodes, reviewing his current and  previous labs and insulin doses and developing a plan to avoid hypo- and hyper-glycemia. Please refer to Patient Instructions for Blood Glucose Monitoring and Insulin/Medications Dosing Guide"  in media tab for additional information. Lance [name] participated in the discussions, expressed understanding, and voiced agreement with the above plans.  All questions were answered to his satisfaction. he is encouraged to contact clinic should he have any questions or concerns prior to his return visit.   Follow up plan: - Return in about 10 days (around 02/26/2018) for Follow up with Meter and Logs Only - no Labs.  Glade Lloyd, MD [facility] [address] [place], [place] [address] Phone: [phone]  Fax: [phone]    02/16/2018, 2:49 PM  This note was partially dictated with voice recognition software. Similar sounding words can be transcribed inadequately or may not  be corrected upon review.

## 2018-02-17 DIAGNOSIS — D509 Iron deficiency anemia, unspecified: Secondary | ICD-10-CM | POA: Diagnosis not present

## 2018-02-17 DIAGNOSIS — N2589 Other disorders resulting from impaired renal tubular function: Secondary | ICD-10-CM | POA: Diagnosis not present

## 2018-02-17 DIAGNOSIS — R509 Fever, unspecified: Secondary | ICD-10-CM | POA: Diagnosis not present

## 2018-02-17 DIAGNOSIS — N2581 Secondary hyperparathyroidism of renal origin: Secondary | ICD-10-CM | POA: Diagnosis not present

## 2018-02-17 DIAGNOSIS — N186 End stage renal disease: Secondary | ICD-10-CM | POA: Diagnosis not present

## 2018-02-17 DIAGNOSIS — D631 Anemia in chronic kidney disease: Secondary | ICD-10-CM | POA: Diagnosis not present

## 2018-02-18 DIAGNOSIS — R509 Fever, unspecified: Secondary | ICD-10-CM | POA: Diagnosis not present

## 2018-02-18 DIAGNOSIS — E1022 Type 1 diabetes mellitus with diabetic chronic kidney disease: Secondary | ICD-10-CM | POA: Diagnosis not present

## 2018-02-18 DIAGNOSIS — D631 Anemia in chronic kidney disease: Secondary | ICD-10-CM | POA: Diagnosis not present

## 2018-02-18 DIAGNOSIS — D509 Iron deficiency anemia, unspecified: Secondary | ICD-10-CM | POA: Diagnosis not present

## 2018-02-18 DIAGNOSIS — N2581 Secondary hyperparathyroidism of renal origin: Secondary | ICD-10-CM | POA: Diagnosis not present

## 2018-02-18 DIAGNOSIS — N186 End stage renal disease: Secondary | ICD-10-CM | POA: Diagnosis not present

## 2018-02-18 DIAGNOSIS — E7849 Other hyperlipidemia: Secondary | ICD-10-CM | POA: Diagnosis not present

## 2018-02-18 DIAGNOSIS — N2589 Other disorders resulting from impaired renal tubular function: Secondary | ICD-10-CM | POA: Diagnosis not present

## 2018-02-19 ENCOUNTER — Other Ambulatory Visit: Payer: Self-pay

## 2018-02-19 ENCOUNTER — Ambulatory Visit (INDEPENDENT_AMBULATORY_CARE_PROVIDER_SITE_OTHER): Payer: Medicare Other | Admitting: Physician Assistant

## 2018-02-19 ENCOUNTER — Encounter (INDEPENDENT_AMBULATORY_CARE_PROVIDER_SITE_OTHER): Payer: Self-pay | Admitting: Orthopedic Surgery

## 2018-02-19 VITALS — Ht 75.0 in | Wt 231.0 lb

## 2018-02-19 DIAGNOSIS — Z992 Dependence on renal dialysis: Secondary | ICD-10-CM

## 2018-02-19 DIAGNOSIS — N186 End stage renal disease: Secondary | ICD-10-CM | POA: Diagnosis not present

## 2018-02-19 DIAGNOSIS — E1142 Type 2 diabetes mellitus with diabetic polyneuropathy: Secondary | ICD-10-CM

## 2018-02-19 DIAGNOSIS — D631 Anemia in chronic kidney disease: Secondary | ICD-10-CM | POA: Diagnosis not present

## 2018-02-19 DIAGNOSIS — N2589 Other disorders resulting from impaired renal tubular function: Secondary | ICD-10-CM | POA: Diagnosis not present

## 2018-02-19 DIAGNOSIS — N2581 Secondary hyperparathyroidism of renal origin: Secondary | ICD-10-CM | POA: Diagnosis not present

## 2018-02-19 DIAGNOSIS — R509 Fever, unspecified: Secondary | ICD-10-CM | POA: Diagnosis not present

## 2018-02-19 DIAGNOSIS — Z89511 Acquired absence of right leg below knee: Secondary | ICD-10-CM | POA: Diagnosis not present

## 2018-02-19 DIAGNOSIS — D509 Iron deficiency anemia, unspecified: Secondary | ICD-10-CM | POA: Diagnosis not present

## 2018-02-19 DIAGNOSIS — S88111A Complete traumatic amputation at level between knee and ankle, right lower leg, initial encounter: Secondary | ICD-10-CM

## 2018-02-19 MED ORDER — GLUCOSE BLOOD VI STRP: ORAL_STRIP | 5 refills | Status: AC

## 2018-02-19 NOTE — Progress Notes (Signed)
Office Visit Note   Patient: Lance [name]           Date of Birth: 1970-05-31           MRN: [id number] Visit Date: 02/19/2018              Requested by: [name], [address] [place], [place] [address] PCP: [name], MD  Chief Complaint  Patient presents with  . Right Leg - Follow-up      HPI: The patient is a 48 year old male who is seen for follow-up following a right below the knee amputation.  He has been working with biotech for adjustments to his prosthesis.  He reports no breakdown over the residual limb and is pleased overall with the prosthetic nail.  He is wearing 5 ply sock  Assessment & Plan: Visit Diagnoses:  1. Amputation of right lower extremity below knee ([place])   2. Diabetic polyneuropathy associated with type 2 diabetes mellitus ([place])   3. ESRD (end stage renal disease) on dialysis [facility])     Plan: Patient is doing well he will continue to work with biotech on any adjustments of his limb.  He will follow-up here annually.  Follow-Up Instructions: Return in about 1 year (around 02/20/2019).   Ortho Exam  Patient is alert, oriented, no adenopathy, well-dressed, normal affect, normal respiratory effort. The right transtibial amputation site is well-healed and without evidence of breakdown.  He has full knee extension and good flexion.  Imaging: No results found.   Labs: Lab Results  Component Value Date   HGBA1C 10.7 (H) 01/19/2018   HGBA1C 6.7 (H) 07/25/2017   HGBA1C 9.1 (H) 02/18/2017   ESRSEDRATE 120 (H) 11/13/2015   CRP 35.4 (H) 11/13/2015   REPTSTATUS 12/06/2015 FINAL 11/30/2015   REPTSTATUS 11/30/2015 FINAL 11/30/2015   GRAMSTAIN  11/30/2015    WBC PRESENT,BOTH PMN AND MONONUCLEAR NO ORGANISMS SEEN CYTOSPIN SMEAR    CULT NO GROWTH 5 DAYS 11/30/2015   LABORGA STREPTOCOCCUS PNEUMONIAE 11/12/2015     Lab Results  Component Value Date   ALBUMIN 2.8 (L) 09/30/2017   ALBUMIN 2.7 (L) 09/29/2017   ALBUMIN 2.6 (L) 09/27/2017    Body mass index is 28.87 kg/m.  Orders:  No orders of the defined types were placed in this encounter.  No orders of the defined types were placed in this encounter.    Procedures: No procedures performed  Clinical Data: No additional findings.  ROS:  All other systems negative, except as noted in the HPI. Review of Systems  Objective: Vital Signs: Ht 6\' 3"  (1.905 m)   Wt 231 lb (104.8 kg)   BMI 28.87 kg/m   Specialty Comments:  No specialty comments available.  PMFS History: Patient Active Problem List   Diagnosis Date Noted  . 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 Cardiac arrest ([place]) 09/24/2017  . Acute respiratory failure with hypoxia ([place]) 08/07/2017  . Respiratory distress 08/07/2017  . Dehiscence of amputation stump ([place]) 07/17/2017  . Benign essential HTN   . Chronic diastolic (congestive) heart failure ([place])   . Labile blood glucose   . Labile blood pressure   . Vomiting   . Phantom limb pain ([place])   . Poorly controlled type 2 diabetes mellitus with peripheral neuropathy ([place])   . Unilateral complete BKA, right, sequela ([place])   . Amputation of right lower extremity below knee ([place]) 06/10/2017  . ESRD on dialysis ([place])   .  Chronic diastolic congestive heart failure ([place])   . History of PSVT (paroxysmal supraventricular tachycardia)   . Diabetes mellitus type 2 in nonobese (HCC)   . Post-operative pain   . Acute blood loss anemia   . Anemia of chronic disease   . Leukocytosis   . Tachycardia   . History of right below knee amputation ([place]) 06/06/2017  . PVD (peripheral vascular disease) ([place]) 05/13/2017  . Bilateral pleural effusion 11/28/2015  . Loculated pleural effusion 11/28/2015  . HCAP (healthcare-associated pneumonia) 11/28/2015  . ESRD (end stage renal disease) on dialysis ([place]) 11/12/2015  . Hyperkalemia 11/12/2015  . Shoulder pain, acute 11/12/2015  . Nausea &  vomiting 11/12/2015  . Diarrhea 11/12/2015  . Hypertensive urgency 05/05/2014  . Nephrotic syndrome 12/29/2013  . Hyponatremia 12/28/2013  . Acute diastolic heart failure ([place]) 12/28/2013  . Hypoglycemia 12/28/2013  . Dyspnea   . Acute renal failure syndrome ([place])   . Diabetic ketoacidosis without coma associated with type 1 diabetes mellitus ([place])   . Demand ischemia ([place]) 12/24/2013  . DKA (diabetic ketoacidoses) ([place]) 12/23/2013  . CAP (community acquired pneumonia) 12/23/2013  . Sepsis due to pneumonia ([place]) 12/23/2013  . Metabolic acidemia 67/01/4579  . Acute renal failure ([place]) 12/23/2013  . Diabetic ketoacidosis ([place]) 12/23/2013  . Chronic diastolic heart failure ([place]) 04/14/2013  . PSVT (paroxysmal supraventricular tachycardia) ([place]) 04/14/2013  . Bilateral leg edema 03/26/2013  . Chronic kidney disease, stage III (moderate) ([place]) 09/29/2011  . Chest pain 09/26/2010  . Type 1 diabetes mellitus with end-stage renal disease ([place]) 09/26/2010  . Essential hypertension, benign 09/26/2010  . Mixed hyperlipidemia 09/26/2010   Past Medical History:  Diagnosis Date  . Aortic stenosis    Very mild in 05/2007  . Arthritis    "right shoulder; right knee; feel like I've got it all over" (11/28/2015)  . CHF (congestive heart failure) ([place])   . Degenerative joint disease    Left TKA  . Diastolic heart failure (HCC)    LVEF 65-70% with grade 2 diastolic dysfunction  . ESRD (end stage renal disease) on dialysis [facility])    (as of 09/24/2017) pt does HD at home on a M/Tu/Th/F schedule.  . Essential hypertension, benign 2000   LVH  . GERD (gastroesophageal reflux disease)   . HCAP (healthcare-associated pneumonia) 11/28/2015  . Hyperlipidemia 2000  . Loculated pleural effusion 11/28/2015   [name] 11/28/2015  . Pneumonia 12/2013; 05/2014  . PSVT (paroxysmal supraventricular tachycardia) (HCC)    Nonsustained; asymptomatic; diagnosed by event recorder in 2006  . Tobacco abuse, in  remission    20 pack years; discontinued in 1985; bronchitic changes on chest x-ray and 2009  . Type 1 diabetes mellitus ([place]) 1990    Family History  Problem Relation Age of Onset  . Diabetes Father   . Hypertension Father   . Diabetes Mother   . Hypertension Mother     Past Surgical History:  Procedure Laterality Date  . AMPUTATION Right 06/06/2017   Procedure: RIGHT BELOW KNEE AMPUTATION;  Surgeon: [name], MD;  Location: [facility];  Service: Orthopedics;  Laterality: Right;  . APPLICATION OF WOUND VAC Right 07/25/2017   Procedure: APPLICATION OF WOUND VAC;  Surgeon: [name], MD;  Location: [facility];  Service: Orthopedics;  Laterality: Right;  . AV FISTULA PLACEMENT Right 06/23/2014   Procedure: ARTERIOVENOUS (AV) FISTULA CREATION;  Surgeon: Angelia Mould, MD;  Location: [facility];  Service: Vascular;  Laterality: Right;  . CARDIAC CATHETERIZATION  [facility]- evaluation for Kidney/ pancreas transplant  . EYE SURGERY Bilateral    "for glaucoma"  . INGUINAL HERNIA REPAIR Right    As a child  . IR DIALY SHUNT INTRO NEEDLE/INTRACATH INITIAL W/IMG RIGHT Right 09/29/2017  . LOWER EXTREMITY ANGIOGRAPHY N/A 05/27/2017   Procedure: LOWER EXTREMITY ANGIOGRAPHY;  Surgeon: [name], MD;  Location: [facility];  Service: Cardiovascular;  Laterality: N/A;  . STUMP REVISION Right 07/25/2017   Procedure: REVISION RIGHT BELOW KNEE AMPUTATION;;  Surgeon: [name], MD;  Location: [facility];  Service: Orthopedics;  Laterality: Right;  . [place] EXTRACTION  1987   Social History   Occupational History  . Not on file  Tobacco Use  . Smoking status: Never Smoker  . Smokeless tobacco: Never Used  Substance and Sexual Activity  . Alcohol use: Yes    Comment: 11/28/2015 "used to drink; stopped ~ 2 yr ago"  . Drug use: No  . Sexual activity: Yes

## 2018-02-20 DIAGNOSIS — N2581 Secondary hyperparathyroidism of renal origin: Secondary | ICD-10-CM | POA: Diagnosis not present

## 2018-02-20 DIAGNOSIS — R509 Fever, unspecified: Secondary | ICD-10-CM | POA: Diagnosis not present

## 2018-02-20 DIAGNOSIS — N2589 Other disorders resulting from impaired renal tubular function: Secondary | ICD-10-CM | POA: Diagnosis not present

## 2018-02-20 DIAGNOSIS — N186 End stage renal disease: Secondary | ICD-10-CM | POA: Diagnosis not present

## 2018-02-20 DIAGNOSIS — D509 Iron deficiency anemia, unspecified: Secondary | ICD-10-CM | POA: Diagnosis not present

## 2018-02-20 DIAGNOSIS — D631 Anemia in chronic kidney disease: Secondary | ICD-10-CM | POA: Diagnosis not present

## 2018-02-21 ENCOUNTER — Encounter (INDEPENDENT_AMBULATORY_CARE_PROVIDER_SITE_OTHER): Payer: Self-pay | Admitting: Physician Assistant

## 2018-02-23 DIAGNOSIS — D631 Anemia in chronic kidney disease: Secondary | ICD-10-CM | POA: Diagnosis not present

## 2018-02-23 DIAGNOSIS — N2581 Secondary hyperparathyroidism of renal origin: Secondary | ICD-10-CM | POA: Diagnosis not present

## 2018-02-23 DIAGNOSIS — N2589 Other disorders resulting from impaired renal tubular function: Secondary | ICD-10-CM | POA: Diagnosis not present

## 2018-02-23 DIAGNOSIS — N186 End stage renal disease: Secondary | ICD-10-CM | POA: Diagnosis not present

## 2018-02-23 DIAGNOSIS — D509 Iron deficiency anemia, unspecified: Secondary | ICD-10-CM | POA: Diagnosis not present

## 2018-02-23 DIAGNOSIS — R509 Fever, unspecified: Secondary | ICD-10-CM | POA: Diagnosis not present

## 2018-02-24 DIAGNOSIS — N2581 Secondary hyperparathyroidism of renal origin: Secondary | ICD-10-CM | POA: Diagnosis not present

## 2018-02-24 DIAGNOSIS — N2589 Other disorders resulting from impaired renal tubular function: Secondary | ICD-10-CM | POA: Diagnosis not present

## 2018-02-24 DIAGNOSIS — R509 Fever, unspecified: Secondary | ICD-10-CM | POA: Diagnosis not present

## 2018-02-24 DIAGNOSIS — N186 End stage renal disease: Secondary | ICD-10-CM | POA: Diagnosis not present

## 2018-02-24 DIAGNOSIS — D631 Anemia in chronic kidney disease: Secondary | ICD-10-CM | POA: Diagnosis not present

## 2018-02-24 DIAGNOSIS — D509 Iron deficiency anemia, unspecified: Secondary | ICD-10-CM | POA: Diagnosis not present

## 2018-02-25 DIAGNOSIS — N2581 Secondary hyperparathyroidism of renal origin: Secondary | ICD-10-CM | POA: Diagnosis not present

## 2018-02-25 DIAGNOSIS — R509 Fever, unspecified: Secondary | ICD-10-CM | POA: Diagnosis not present

## 2018-02-25 DIAGNOSIS — N2589 Other disorders resulting from impaired renal tubular function: Secondary | ICD-10-CM | POA: Diagnosis not present

## 2018-02-25 DIAGNOSIS — D509 Iron deficiency anemia, unspecified: Secondary | ICD-10-CM | POA: Diagnosis not present

## 2018-02-25 DIAGNOSIS — N186 End stage renal disease: Secondary | ICD-10-CM | POA: Diagnosis not present

## 2018-02-25 DIAGNOSIS — D631 Anemia in chronic kidney disease: Secondary | ICD-10-CM | POA: Diagnosis not present

## 2018-02-26 DIAGNOSIS — D631 Anemia in chronic kidney disease: Secondary | ICD-10-CM | POA: Diagnosis not present

## 2018-02-26 DIAGNOSIS — N186 End stage renal disease: Secondary | ICD-10-CM | POA: Diagnosis not present

## 2018-02-26 DIAGNOSIS — D509 Iron deficiency anemia, unspecified: Secondary | ICD-10-CM | POA: Diagnosis not present

## 2018-02-26 DIAGNOSIS — N2581 Secondary hyperparathyroidism of renal origin: Secondary | ICD-10-CM | POA: Diagnosis not present

## 2018-02-26 DIAGNOSIS — N2589 Other disorders resulting from impaired renal tubular function: Secondary | ICD-10-CM | POA: Diagnosis not present

## 2018-02-26 DIAGNOSIS — R509 Fever, unspecified: Secondary | ICD-10-CM | POA: Diagnosis not present

## 2018-02-27 DIAGNOSIS — R509 Fever, unspecified: Secondary | ICD-10-CM | POA: Diagnosis not present

## 2018-02-27 DIAGNOSIS — N2589 Other disorders resulting from impaired renal tubular function: Secondary | ICD-10-CM | POA: Diagnosis not present

## 2018-02-27 DIAGNOSIS — D509 Iron deficiency anemia, unspecified: Secondary | ICD-10-CM | POA: Diagnosis not present

## 2018-02-27 DIAGNOSIS — D631 Anemia in chronic kidney disease: Secondary | ICD-10-CM | POA: Diagnosis not present

## 2018-02-27 DIAGNOSIS — N186 End stage renal disease: Secondary | ICD-10-CM | POA: Diagnosis not present

## 2018-02-27 DIAGNOSIS — N2581 Secondary hyperparathyroidism of renal origin: Secondary | ICD-10-CM | POA: Diagnosis not present

## 2018-03-02 DIAGNOSIS — N2581 Secondary hyperparathyroidism of renal origin: Secondary | ICD-10-CM | POA: Diagnosis not present

## 2018-03-02 DIAGNOSIS — N186 End stage renal disease: Secondary | ICD-10-CM | POA: Diagnosis not present

## 2018-03-02 DIAGNOSIS — N2589 Other disorders resulting from impaired renal tubular function: Secondary | ICD-10-CM | POA: Diagnosis not present

## 2018-03-02 DIAGNOSIS — D631 Anemia in chronic kidney disease: Secondary | ICD-10-CM | POA: Diagnosis not present

## 2018-03-02 DIAGNOSIS — R509 Fever, unspecified: Secondary | ICD-10-CM | POA: Diagnosis not present

## 2018-03-02 DIAGNOSIS — D509 Iron deficiency anemia, unspecified: Secondary | ICD-10-CM | POA: Diagnosis not present

## 2018-03-03 ENCOUNTER — Encounter: Payer: Self-pay | Admitting: "Endocrinology

## 2018-03-03 ENCOUNTER — Ambulatory Visit (INDEPENDENT_AMBULATORY_CARE_PROVIDER_SITE_OTHER): Payer: Medicare Other | Admitting: "Endocrinology

## 2018-03-03 VITALS — BP 142/75 | HR 94 | Ht 75.0 in | Wt 231.0 lb

## 2018-03-03 DIAGNOSIS — I10 Essential (primary) hypertension: Secondary | ICD-10-CM

## 2018-03-03 DIAGNOSIS — E1059 Type 1 diabetes mellitus with other circulatory complications: Secondary | ICD-10-CM

## 2018-03-03 DIAGNOSIS — D509 Iron deficiency anemia, unspecified: Secondary | ICD-10-CM | POA: Diagnosis not present

## 2018-03-03 DIAGNOSIS — E1022 Type 1 diabetes mellitus with diabetic chronic kidney disease: Secondary | ICD-10-CM

## 2018-03-03 DIAGNOSIS — N186 End stage renal disease: Secondary | ICD-10-CM | POA: Diagnosis not present

## 2018-03-03 DIAGNOSIS — N2589 Other disorders resulting from impaired renal tubular function: Secondary | ICD-10-CM | POA: Diagnosis not present

## 2018-03-03 DIAGNOSIS — N2581 Secondary hyperparathyroidism of renal origin: Secondary | ICD-10-CM | POA: Diagnosis not present

## 2018-03-03 DIAGNOSIS — R509 Fever, unspecified: Secondary | ICD-10-CM | POA: Diagnosis not present

## 2018-03-03 DIAGNOSIS — D631 Anemia in chronic kidney disease: Secondary | ICD-10-CM | POA: Diagnosis not present

## 2018-03-03 DIAGNOSIS — E782 Mixed hyperlipidemia: Secondary | ICD-10-CM

## 2018-03-03 MED ORDER — INSULIN GLARGINE 100 UNIT/ML SOLOSTAR PEN: 34.0000 [IU] | PEN_INJECTOR | Freq: Every day | SUBCUTANEOUS | 2 refills | Status: DC

## 2018-03-03 MED ORDER — INSULIN LISPRO (1 UNIT DIAL) 100 UNIT/ML (KWIKPEN): 15.0000 [IU] | PEN_INJECTOR | Freq: Three times a day (TID) | SUBCUTANEOUS | 2 refills | Status: DC

## 2018-03-03 MED ORDER — INSULIN GLARGINE 100 UNITS/ML SOLOSTAR PEN: 34.0000 [IU] | PEN_INJECTOR | Freq: Every day | SUBCUTANEOUS | 2 refills | Status: DC

## 2018-03-03 MED ORDER — INSULIN GLARGINE 100 UNITS/ML SOLOSTAR PEN: 32.0000 [IU] | PEN_INJECTOR | Freq: Every day | SUBCUTANEOUS | 2 refills | Status: DC

## 2018-03-03 NOTE — Progress Notes (Signed)
Endocrinology follow-up note       03/03/2018, 1:12 PM   Subjective:    Patient ID: Lance [name], male    DOB: 03/19/1970.  Lance [name] is being seen in follow-up  for management of currently uncontrolled, complicated, symptomatic type 1 diabetes, hyperlipidemia, hypertension. PMD:   Lance Kirschner, MD.   Past Medical History:  Diagnosis Date  . Aortic stenosis    Very mild in 05/2007  . Arthritis    "right shoulder; right knee; feel like I've got it all over" (11/28/2015)  . CHF (congestive heart failure) (Lance [name])   . Degenerative joint disease    Left TKA  . Diastolic heart failure (HCC)    LVEF 65-70% with grade 2 diastolic dysfunction  . ESRD (end stage renal disease) on dialysis [facility])    (as of 09/24/2017) pt does HD at home on a M/Tu/Th/F schedule.  . Essential hypertension, benign 2000   LVH  . GERD (gastroesophageal reflux disease)   . HCAP (healthcare-associated pneumonia) 11/28/2015  . Hyperlipidemia 2000  . Loculated pleural effusion 11/28/2015   Lance [name] 11/28/2015  . Pneumonia 12/2013; 05/2014  . PSVT (paroxysmal supraventricular tachycardia) (HCC)    Nonsustained; asymptomatic; diagnosed by event recorder in 2006  . Tobacco abuse, in remission    20 pack years; discontinued in 1985; bronchitic changes on chest x-ray and 2009  . Type 1 diabetes mellitus (Lance [name]) 1990   Past Surgical History:  Procedure Laterality Date  . AMPUTATION Right 06/06/2017   Procedure: RIGHT BELOW KNEE AMPUTATION;  Surgeon: Lance Minion, MD;  Location: [place];  Service: Orthopedics;  Laterality: Right;  . APPLICATION OF WOUND VAC Right 07/25/2017   Procedure: APPLICATION OF WOUND VAC;  Surgeon: Lance Minion, MD;  Location: [place];  Service: Orthopedics;  Laterality: Right;  . AV FISTULA PLACEMENT Right 06/23/2014   Procedure: ARTERIOVENOUS (AV) FISTULA CREATION;  Surgeon: Lance Mould, MD;   Location: [place];  Service: Vascular;  Laterality: Right;  . CARDIAC CATHETERIZATION     [facility]- evaluation for Kidney/ pancreas transplant  . EYE SURGERY Bilateral    "for glaucoma"  . INGUINAL HERNIA REPAIR Right    As a child  . IR DIALY SHUNT INTRO NEEDLE/INTRACATH INITIAL W/IMG RIGHT Right 09/29/2017  . LOWER EXTREMITY ANGIOGRAPHY N/A 05/27/2017   Procedure: LOWER EXTREMITY ANGIOGRAPHY;  Surgeon: [name], MD;  Location: [place] [facility];  Service: Cardiovascular;  Laterality: N/A;  . STUMP REVISION Right 07/25/2017   Procedure: REVISION RIGHT BELOW KNEE AMPUTATION;;  Surgeon: Lance Minion, MD;  Location: [place];  Service: Orthopedics;  Laterality: Right;  . WISDOM TOOTH EXTRACTION  1987   Social History   Socioeconomic History  . Marital status: Married    Spouse name: Not on file  . Number of children: Not on file  . Years of education: Not on file  . Highest education level: Not on file  Occupational History  . Not on file  Social Needs  . Financial resource strain: Not on file  . Food insecurity:    Worry: Not on file    Inability: Not  on file  . Transportation needs:    Medical: Not on file    Non-medical: Not on file  Tobacco Use  . Smoking status: Never Smoker  . Smokeless tobacco: Never Used  Substance and Sexual Activity  . Alcohol use: Yes    Comment: 11/28/2015 "used to drink; stopped ~ 2 yr ago"  . Drug use: No  . Sexual activity: Yes  Lifestyle  . Physical activity:    Days per week: Not on file    Minutes per session: Not on file  . Stress: Not on file  Relationships  . Social connections:    Talks on phone: Not on file    Gets together: Not on file    Attends religious service: Not on file    Active member of club or organization: Not on file    Attends meetings of clubs or organizations: Not on file    Relationship status: Not on file  Other Topics Concern  . Not on file  Social History Narrative  . Not on file   Outpatient  Encounter Medications as of 03/03/2018  Medication Sig  . acetaminophen (TYLENOL) 500 MG tablet Take 1,000 mg by mouth every 6 (six) hours as needed (for pain).  Marland Kitchen aspirin EC 325 MG EC tablet Take 1 tablet (325 mg total) by mouth daily.  . calcitRIOL (ROCALTROL) 0.25 MCG capsule Take 1 capsule (0.25 mcg total) by mouth every Monday, Wednesday, and Friday with hemodialysis. (Patient taking differently: Take 0.25 mcg by mouth See admin instructions. Take 0.25 mcg by mouth once a day on Mon/Tues/Thurs/Fri with home dialysis)  . carvedilol (COREG) 3.125 MG tablet Take 1 tablet (3.125 mg total) by mouth 2 (two) times daily with a meal. (Patient taking differently: Take 6.25 mg by mouth 2 (two) times daily with a meal. )  . cloNIDine (CATAPRES) 0.1 MG tablet Take 0.1 mg by mouth daily.   . Continuous Blood Gluc Sensor (FREESTYLE LIBRE 14 DAY SENSOR) MISC 1 each by Does not apply route every 14 (fourteen) days.  . Continuous Blood Gluc Sensor (FREESTYLE LIBRE SENSOR SYSTEM) MISC APPLY 1 SENSOR TO THE SKIN EVERY 10 DAYS  . docusate sodium (COLACE) 100 MG capsule Take 1 capsule (100 mg total) by mouth 2 (two) times daily. (Patient taking differently: Take 200 mg by mouth daily as needed (when taking pain meds). )  . glucose blood (ACCU-CHEK GUIDE) test strip Use as instructed 4 x daily. E11.65  . Insulin Glargine (LANTUS SOLOSTAR) 100 UNIT/ML Solostar Pen Inject 34 Units into the skin daily.  . insulin lispro (HUMALOG) 100 UNIT/ML KwikPen Inject 0.15-0.21 mLs (15-21 Units total) into the skin 3 (three) times daily before meals.  Marland Kitchen lisinopril (PRINIVIL,ZESTRIL) 40 MG tablet Take 40 mg by mouth daily.   . ondansetron (ZOFRAN ODT) 4 MG disintegrating tablet Take 1 tablet (4 mg total) by mouth every 8 (eight) hours as needed for nausea or vomiting.  . pantoprazole (PROTONIX) 40 MG tablet TAKE 1 TABLET BY MOUTH EVERY DAY (Patient taking differently: Take 40 mg by mouth daily. )  . polyethylene glycol (MIRALAX)  packet Take 17 g by mouth daily. (Patient taking differently: Take 17 g by mouth See admin instructions. Take 17 grams by mouth, mixed, once a day only when taking pain meds)  . psyllium (HYDROCIL/METAMUCIL) 95 % PACK Take 1 packet by mouth daily.  . sucroferric oxyhydroxide (VELPHORO) 500 MG chewable tablet Chew 3 tablets (1,500 mg total) by mouth 3 (three) times daily.  . [  DISCONTINUED] insulin glargine (LANTUS) 100 unit/mL SOPN Inject 0.3 mLs (30 Units total) into the skin at bedtime.  . [DISCONTINUED] insulin glargine (LANTUS) 100 unit/mL SOPN Inject 0.32 mLs (32 Units total) into the skin at bedtime.  . [DISCONTINUED] insulin glargine (LANTUS) 100 unit/mL SOPN Inject 0.34 mLs (34 Units total) into the skin at bedtime.  . [DISCONTINUED] insulin lispro (HUMALOG) 100 UNIT/ML KwikPen Inject 15-21 Units into the skin 3 (three) times daily before meals.   No facility-administered encounter medications on file as of 03/03/2018.     ALLERGIES: Allergies  Allergen Reactions  . Atorvastatin Other (See Comments)    Muscle pain   . Minoxidil Other (See Comments)    Fluid retention   . Adhesive [Tape] Other (See Comments)    MUST USE "SILK TAPE," AS ANY OTHER "BURNS" THE SKIN  . Statins Other (See Comments)    Muscle pain    VACCINATION STATUS: Immunization History  Administered Date(s) Administered  . Influenza,inj,Quad PF,6+ Mos 05/06/2014  . Pneumococcal Polysaccharide-23 05/06/2014    Diabetes  He presents for his follow-up diabetic visit. He has type 1 diabetes mellitus. Onset time: He was diagnosed at approximate age of 34 years with type 1 diabetes. His disease course has been improving (Since his last visit, he was hospitalized for cardiac arrest which reportedly happened because of hyperkalemia from defective dialysis machine.). There are no hypoglycemic associated symptoms. Pertinent negatives for hypoglycemia include no confusion, headaches, pallor or seizures. Pertinent negatives  for diabetes include no chest pain, no fatigue, no polydipsia, no polyphagia, no polyuria and no weakness. There are no hypoglycemic complications. Symptoms are improving. Diabetic complications include nephropathy and PVD. (His diabetes is complicated by end-stage renal disease on  hemodialysis for the last 5 years.  Since last visit, he underwent right below-knee amputation due to peripheral arterial disease.  Patient is  on  Kidney transplant list.) Risk factors for coronary artery disease include diabetes mellitus, hypertension and sedentary lifestyle. Current diabetic treatment includes insulin injections. His weight is increasing steadily (Status post right BKA.). He is following a generally unhealthy diet. He has had a previous visit with a dietitian. He never participates in exercise. His home blood glucose trend is decreasing steadily. His breakfast blood glucose range is generally >200 mg/dl. His lunch blood glucose range is generally 180-200 mg/dl. His dinner blood glucose range is generally 180-200 mg/dl. His bedtime blood glucose range is generally >200 mg/dl. His overall blood glucose range is >200 mg/dl. ( -Once with incomplete use of his CGM, no logs on his insulin administration.  His A1c is higher at 10.7% increasing from 6.7%.   ) An ACE inhibitor/angiotensin II receptor blocker is being taken. Eye exam is current.  Hypertension  This is a chronic problem. The current episode started more than 1 year ago. The problem is controlled. Pertinent negatives include no chest pain, headaches, neck pain, palpitations or shortness of breath. Risk factors for coronary artery disease include diabetes mellitus and sedentary lifestyle. Past treatments include ACE inhibitors. Hypertensive end-organ damage includes kidney disease and PVD.   Review of Systems  Constitutional: Negative for chills, fatigue, fever and unexpected weight change.  HENT: Negative for dental problem, mouth sores and trouble  swallowing.   Eyes: Negative for visual disturbance.  Respiratory: Negative for cough, choking, chest tightness, shortness of breath and wheezing.   Cardiovascular: Negative for chest pain, palpitations and leg swelling.  Gastrointestinal: Negative for abdominal distention, abdominal pain, constipation, diarrhea, nausea and vomiting.  Endocrine:  Negative for polydipsia, polyphagia and polyuria.  Genitourinary: Negative for dysuria, flank pain, hematuria and urgency.  Musculoskeletal: Negative for back pain, gait problem, myalgias and neck pain.  Skin: Negative for pallor, rash and wound.  Neurological: Negative for seizures, syncope, weakness, numbness and headaches.  Psychiatric/Behavioral: Negative for confusion and dysphoric mood.    Objective:    BP (!) 142/75   Pulse 94   Ht 6\' 3"  (1.905 m)   Wt 231 lb (104.8 kg)   BMI 28.87 kg/m   Wt Readings from Last 3 Encounters:  03/03/18 231 lb (104.8 kg)  02/19/18 231 lb (104.8 kg)  02/16/18 231 lb (104.8 kg)     Physical Exam  Constitutional: He is oriented to person, place, and time. He appears well-developed. He is cooperative. No distress.  HENT:  Head: Normocephalic and atraumatic.  Eyes: EOM are normal.  Neck: Normal range of motion. Neck supple. No tracheal deviation present. No thyromegaly present.  Cardiovascular: Normal rate, S1 normal and S2 normal. Exam reveals no gallop.  No murmur heard. Pulses:      Dorsalis pedis pulses are 1+ on the right side and 1+ on the left side.       Posterior tibial pulses are 1+ on the right side and 1+ on the left side.  Pulmonary/Chest: Effort normal. No respiratory distress. He has no wheezes.  Abdominal: He exhibits no distension. There is no abdominal tenderness. There is no guarding and no CVA tenderness.  Musculoskeletal:        General: No edema.     Right shoulder: He exhibits no swelling and no deformity.     Comments: Patient on a wheelchair due to right below-knee amputation  from peripheral arterial disease as a complication of diabetes.   Neurological: He is alert and oriented to person, place, and time. He has normal strength. No cranial nerve deficit or sensory deficit. Gait normal.  Skin: Skin is warm and dry. No rash noted. No cyanosis. Nails show no clubbing.  Psychiatric: He has a normal mood and affect. His speech is normal and behavior is normal. Cognition and memory are normal.   CMP ( most recent) CMP     Component Value Date/Time   NA 133 (L) 09/30/2017 0510   NA 131 (L) 02/18/2017 1438   K 3.7 09/30/2017 0510   CL 97 (L) 09/30/2017 0510   CO2 27 09/30/2017 0510   GLUCOSE 170 (H) 09/30/2017 0510   BUN 40 (H) 09/30/2017 0510   BUN 46 (H) 02/18/2017 1438   CREATININE 9.38 (H) 09/30/2017 0510   CREATININE 2.21 (H) 03/26/2013 1520   CALCIUM 8.6 (L) 09/30/2017 0510   PROT 6.9 09/26/2017 0350   PROT 8.3 02/18/2017 1438   ALBUMIN 2.8 (L) 09/30/2017 0510   ALBUMIN 4.4 02/18/2017 1438   AST 39 09/26/2017 0350   ALT 43 09/26/2017 0350   ALKPHOS 94 09/26/2017 0350   BILITOT 0.8 09/26/2017 0350   BILITOT 0.5 02/18/2017 1438   GFRNONAA 6 (L) 09/30/2017 0510   GFRAA 7 (L) 09/30/2017 0510     Diabetic Labs (most recent): Lab Results  Component Value Date   HGBA1C 10.7 (H) 01/19/2018   HGBA1C 6.7 (H) 07/25/2017   HGBA1C 9.1 (H) 02/18/2017     Lipid Panel ( most recent) Lipid Panel     Component Value Date/Time   CHOL 174 12/28/2013 0707   TRIG 102 09/25/2017 0450   HDL 39 (L) 12/28/2013 0707   CHOLHDL 4.5 12/28/2013 4098  VLDL 46 (H) 12/28/2013 0707   LDLCALC 89 12/28/2013 0707      Lab Results  Component Value Date   TSH 0.697 01/19/2018   TSH 1.390 02/18/2017   TSH 1.760 12/26/2013   FREET4 0.98 01/19/2018   FREET4 1.00 02/18/2017     Assessment & Plan:   1. Type 1 diabetes mellitus with end-stage renal disease ([place])  - Lance [name] has currently uncontrolled symptomatic type 1 DM since  48 years of age.  -His  diabetes is complicated by peripheral arterial disease status post right BKA, ESRD currently on hemodialysis, recent hospitalization for cardiac arrest due to hyperkalemia which resulted from defective dialysis machine.   -He returns with worsening A1c of 10.7%, increasing from 6.7%.  -Average blood glucose for the last 14-day average is 197, improving from 440.  His CGM pattern shows 47% hyperglycemia, 46% time in range, 7% hypoglycemia.  -his diabetes is complicated by end-stage renal disease on hemodialysis/kidney transplant list, PAD, sedentary life and Lance [name] remains at a high risk for more acute and chronic complications which include CAD, CVA, retinopathy, and neuropathy. These are all discussed in detail with the patient.  - I have counseled him on diet management  by adopting a carbohydrate restricted/protein rich diet.  -  Suggestion is made for him to avoid simple carbohydrates  from his diet including Cakes, Sweet Desserts / Pastries, Ice Cream, Soda (diet and regular), Sweet Tea, Candies, Chips, Cookies, Store Bought Juices, Alcohol in Excess of  1-2 drinks a day, Artificial Sweeteners, and "Sugar-free" Products. This will help patient to have stable blood glucose profile and potentially avoid unintended weight gain.   - I encouraged him to switch to  unprocessed or minimally processed complex starch and increased protein intake (animal or plant source), fruits, and vegetables.  - he is advised to stick to a routine mealtimes to eat 3 meals  a day and avoid unnecessary snacks ( to snack only to correct hypoglycemia).   - he has been scheduled with Jearld Fenton, RDN, CDE for individualized diabetes education- consult pending.  - I have approached him with the following individualized plan to manage diabetes and patient agrees:    He is approached to stay engaged,  use of his CGM device at all times.  He will continue to require intensive treatment with basal/bolus  insulin.     -He is advised to increase Lantus to 34  units nightly, resume Humalog 15 units 3 times a day with meals for pre-meal BG readings of 70-150mg /dl, plus patient specific correction dose for unexpected hyperglycemia above 150mg /dl, associated with strict documenting of  glucose 4 times a day-before meals and at bedtime. - Patient is warned not to take insulin without proper monitoring per orders. -Adjustment parameters are given for hypo and hyperglycemia in writing. -Patient is encouraged to call clinic for blood glucose levels less than 70 or above 300 mg /dl.  - Insulin is the only choice of therapy for him. He is benefiting from CGM, I advised him to wear it at all times.   - Patient specific target  A1c;  LDL, HDL, Triglycerides, and  Waist Circumference were discussed in detail.  2) BP/HTN: His blood pressure is not controlled to target.  He is advised to continue his current blood pressure medications including Coreg 3.125 mg p.o. twice daily, lisinopril 40 mg p.o. Daily.   3) Lipids/HPL: Recent  Lipid panel   shows LDL at 89. He  is marked as allergic to statins.   4)  Weight/Diet: CDE Consult has been  initiated , exercise, and detailed carbohydrates information provided.  5) Chronic Care/Health Maintenance:  -he  is on ACEI medications and  is encouraged to continue to follow up with Ophthalmology, Dentist, nephrology,  Podiatrist at least yearly or according to recommendations, and advised to  stay away from smoking. I have recommended yearly flu vaccine and pneumonia vaccination at least every 5 years; moderate intensity exercise for up to 150 minutes weekly; and  sleep for at least 7 hours a day.  - I advised patient to maintain close follow up with Lance Kirschner, MD for primary care needs.  - Time spent with the patient: 25 min, of which >50% was spent in reviewing his blood glucose logs , discussing his hypo- and hyper-glycemic episodes, reviewing his current and   previous labs and insulin doses and developing a plan to avoid hypo- and hyper-glycemia. Please refer to Patient Instructions for Blood Glucose Monitoring and Insulin/Medications Dosing Guide"  in media tab for additional information. Lance [name] participated in the discussions, expressed understanding, and voiced agreement with the above plans.  All questions were answered to his satisfaction. he is encouraged to contact clinic should he have any questions or concerns prior to his return visit.    Follow up plan: - Return in about 3 months (around 06/02/2018) for Meter, and Logs.  Lance Lloyd, MD [facility] [address] [place], [place] [address] Phone: [phone]  Fax: [phone]    03/03/2018, 1:12 PM  This note was partially dictated with voice recognition software. Similar sounding words can be transcribed inadequately or may not  be corrected upon review.

## 2018-03-03 NOTE — Progress Notes (Signed)
Pt is using the 14 day freestyle Libre to test glucose.

## 2018-03-03 NOTE — Patient Instructions (Signed)

## 2018-03-05 DIAGNOSIS — N2589 Other disorders resulting from impaired renal tubular function: Secondary | ICD-10-CM | POA: Diagnosis not present

## 2018-03-05 DIAGNOSIS — D509 Iron deficiency anemia, unspecified: Secondary | ICD-10-CM | POA: Diagnosis not present

## 2018-03-05 DIAGNOSIS — R509 Fever, unspecified: Secondary | ICD-10-CM | POA: Diagnosis not present

## 2018-03-05 DIAGNOSIS — N2581 Secondary hyperparathyroidism of renal origin: Secondary | ICD-10-CM | POA: Diagnosis not present

## 2018-03-05 DIAGNOSIS — D631 Anemia in chronic kidney disease: Secondary | ICD-10-CM | POA: Diagnosis not present

## 2018-03-05 DIAGNOSIS — N186 End stage renal disease: Secondary | ICD-10-CM | POA: Diagnosis not present

## 2018-03-06 DIAGNOSIS — N2581 Secondary hyperparathyroidism of renal origin: Secondary | ICD-10-CM | POA: Diagnosis not present

## 2018-03-06 DIAGNOSIS — D509 Iron deficiency anemia, unspecified: Secondary | ICD-10-CM | POA: Diagnosis not present

## 2018-03-06 DIAGNOSIS — N2589 Other disorders resulting from impaired renal tubular function: Secondary | ICD-10-CM | POA: Diagnosis not present

## 2018-03-06 DIAGNOSIS — R509 Fever, unspecified: Secondary | ICD-10-CM | POA: Diagnosis not present

## 2018-03-06 DIAGNOSIS — D631 Anemia in chronic kidney disease: Secondary | ICD-10-CM | POA: Diagnosis not present

## 2018-03-06 DIAGNOSIS — N186 End stage renal disease: Secondary | ICD-10-CM | POA: Diagnosis not present

## 2018-03-09 DIAGNOSIS — N2589 Other disorders resulting from impaired renal tubular function: Secondary | ICD-10-CM | POA: Diagnosis not present

## 2018-03-09 DIAGNOSIS — N2581 Secondary hyperparathyroidism of renal origin: Secondary | ICD-10-CM | POA: Diagnosis not present

## 2018-03-09 DIAGNOSIS — N186 End stage renal disease: Secondary | ICD-10-CM | POA: Diagnosis not present

## 2018-03-09 DIAGNOSIS — D631 Anemia in chronic kidney disease: Secondary | ICD-10-CM | POA: Diagnosis not present

## 2018-03-09 DIAGNOSIS — D509 Iron deficiency anemia, unspecified: Secondary | ICD-10-CM | POA: Diagnosis not present

## 2018-03-09 DIAGNOSIS — R509 Fever, unspecified: Secondary | ICD-10-CM | POA: Diagnosis not present

## 2018-03-10 DIAGNOSIS — D631 Anemia in chronic kidney disease: Secondary | ICD-10-CM | POA: Diagnosis not present

## 2018-03-10 DIAGNOSIS — R509 Fever, unspecified: Secondary | ICD-10-CM | POA: Diagnosis not present

## 2018-03-10 DIAGNOSIS — N186 End stage renal disease: Secondary | ICD-10-CM | POA: Diagnosis not present

## 2018-03-10 DIAGNOSIS — N2589 Other disorders resulting from impaired renal tubular function: Secondary | ICD-10-CM | POA: Diagnosis not present

## 2018-03-10 DIAGNOSIS — D509 Iron deficiency anemia, unspecified: Secondary | ICD-10-CM | POA: Diagnosis not present

## 2018-03-10 DIAGNOSIS — N2581 Secondary hyperparathyroidism of renal origin: Secondary | ICD-10-CM | POA: Diagnosis not present

## 2018-03-12 DIAGNOSIS — N2589 Other disorders resulting from impaired renal tubular function: Secondary | ICD-10-CM | POA: Diagnosis not present

## 2018-03-12 DIAGNOSIS — D631 Anemia in chronic kidney disease: Secondary | ICD-10-CM | POA: Diagnosis not present

## 2018-03-12 DIAGNOSIS — R509 Fever, unspecified: Secondary | ICD-10-CM | POA: Diagnosis not present

## 2018-03-12 DIAGNOSIS — N186 End stage renal disease: Secondary | ICD-10-CM | POA: Diagnosis not present

## 2018-03-12 DIAGNOSIS — N2581 Secondary hyperparathyroidism of renal origin: Secondary | ICD-10-CM | POA: Diagnosis not present

## 2018-03-12 DIAGNOSIS — D509 Iron deficiency anemia, unspecified: Secondary | ICD-10-CM | POA: Diagnosis not present

## 2018-03-13 DIAGNOSIS — D509 Iron deficiency anemia, unspecified: Secondary | ICD-10-CM | POA: Diagnosis not present

## 2018-03-13 DIAGNOSIS — E1022 Type 1 diabetes mellitus with diabetic chronic kidney disease: Secondary | ICD-10-CM | POA: Diagnosis not present

## 2018-03-13 DIAGNOSIS — N2589 Other disorders resulting from impaired renal tubular function: Secondary | ICD-10-CM | POA: Diagnosis not present

## 2018-03-13 DIAGNOSIS — N186 End stage renal disease: Secondary | ICD-10-CM | POA: Diagnosis not present

## 2018-03-13 DIAGNOSIS — D631 Anemia in chronic kidney disease: Secondary | ICD-10-CM | POA: Diagnosis not present

## 2018-03-13 DIAGNOSIS — R509 Fever, unspecified: Secondary | ICD-10-CM | POA: Diagnosis not present

## 2018-03-13 DIAGNOSIS — Z992 Dependence on renal dialysis: Secondary | ICD-10-CM | POA: Diagnosis not present

## 2018-03-13 DIAGNOSIS — N2581 Secondary hyperparathyroidism of renal origin: Secondary | ICD-10-CM | POA: Diagnosis not present

## 2018-03-14 DIAGNOSIS — Z992 Dependence on renal dialysis: Secondary | ICD-10-CM | POA: Diagnosis not present

## 2018-03-14 DIAGNOSIS — E1022 Type 1 diabetes mellitus with diabetic chronic kidney disease: Secondary | ICD-10-CM | POA: Diagnosis not present

## 2018-03-14 DIAGNOSIS — N186 End stage renal disease: Secondary | ICD-10-CM | POA: Diagnosis not present

## 2018-03-16 DIAGNOSIS — Z992 Dependence on renal dialysis: Secondary | ICD-10-CM | POA: Diagnosis not present

## 2018-03-16 DIAGNOSIS — E8779 Other fluid overload: Secondary | ICD-10-CM | POA: Diagnosis not present

## 2018-03-16 DIAGNOSIS — E1022 Type 1 diabetes mellitus with diabetic chronic kidney disease: Secondary | ICD-10-CM | POA: Diagnosis not present

## 2018-03-16 DIAGNOSIS — D631 Anemia in chronic kidney disease: Secondary | ICD-10-CM | POA: Diagnosis not present

## 2018-03-16 DIAGNOSIS — Z4931 Encounter for adequacy testing for hemodialysis: Secondary | ICD-10-CM | POA: Diagnosis not present

## 2018-03-16 DIAGNOSIS — N2589 Other disorders resulting from impaired renal tubular function: Secondary | ICD-10-CM | POA: Diagnosis not present

## 2018-03-16 DIAGNOSIS — N186 End stage renal disease: Secondary | ICD-10-CM | POA: Diagnosis not present

## 2018-03-16 DIAGNOSIS — R509 Fever, unspecified: Secondary | ICD-10-CM | POA: Diagnosis not present

## 2018-03-16 DIAGNOSIS — N2581 Secondary hyperparathyroidism of renal origin: Secondary | ICD-10-CM | POA: Diagnosis not present

## 2018-03-16 DIAGNOSIS — K7689 Other specified diseases of liver: Secondary | ICD-10-CM | POA: Diagnosis not present

## 2018-03-17 DIAGNOSIS — R509 Fever, unspecified: Secondary | ICD-10-CM | POA: Diagnosis not present

## 2018-03-17 DIAGNOSIS — D631 Anemia in chronic kidney disease: Secondary | ICD-10-CM | POA: Diagnosis not present

## 2018-03-17 DIAGNOSIS — N2581 Secondary hyperparathyroidism of renal origin: Secondary | ICD-10-CM | POA: Diagnosis not present

## 2018-03-17 DIAGNOSIS — Z4931 Encounter for adequacy testing for hemodialysis: Secondary | ICD-10-CM | POA: Diagnosis not present

## 2018-03-17 DIAGNOSIS — K7689 Other specified diseases of liver: Secondary | ICD-10-CM | POA: Diagnosis not present

## 2018-03-17 DIAGNOSIS — N186 End stage renal disease: Secondary | ICD-10-CM | POA: Diagnosis not present

## 2018-03-19 DIAGNOSIS — R509 Fever, unspecified: Secondary | ICD-10-CM | POA: Diagnosis not present

## 2018-03-19 DIAGNOSIS — K7689 Other specified diseases of liver: Secondary | ICD-10-CM | POA: Diagnosis not present

## 2018-03-19 DIAGNOSIS — Z4931 Encounter for adequacy testing for hemodialysis: Secondary | ICD-10-CM | POA: Diagnosis not present

## 2018-03-19 DIAGNOSIS — N2581 Secondary hyperparathyroidism of renal origin: Secondary | ICD-10-CM | POA: Diagnosis not present

## 2018-03-19 DIAGNOSIS — D631 Anemia in chronic kidney disease: Secondary | ICD-10-CM | POA: Diagnosis not present

## 2018-03-19 DIAGNOSIS — N186 End stage renal disease: Secondary | ICD-10-CM | POA: Diagnosis not present

## 2018-03-20 DIAGNOSIS — K7689 Other specified diseases of liver: Secondary | ICD-10-CM | POA: Diagnosis not present

## 2018-03-20 DIAGNOSIS — E7849 Other hyperlipidemia: Secondary | ICD-10-CM | POA: Diagnosis not present

## 2018-03-20 DIAGNOSIS — D631 Anemia in chronic kidney disease: Secondary | ICD-10-CM | POA: Diagnosis not present

## 2018-03-20 DIAGNOSIS — N2581 Secondary hyperparathyroidism of renal origin: Secondary | ICD-10-CM | POA: Diagnosis not present

## 2018-03-20 DIAGNOSIS — N186 End stage renal disease: Secondary | ICD-10-CM | POA: Diagnosis not present

## 2018-03-20 DIAGNOSIS — Z4931 Encounter for adequacy testing for hemodialysis: Secondary | ICD-10-CM | POA: Diagnosis not present

## 2018-03-20 DIAGNOSIS — R509 Fever, unspecified: Secondary | ICD-10-CM | POA: Diagnosis not present

## 2018-03-23 DIAGNOSIS — R509 Fever, unspecified: Secondary | ICD-10-CM | POA: Diagnosis not present

## 2018-03-23 DIAGNOSIS — D631 Anemia in chronic kidney disease: Secondary | ICD-10-CM | POA: Diagnosis not present

## 2018-03-23 DIAGNOSIS — N2581 Secondary hyperparathyroidism of renal origin: Secondary | ICD-10-CM | POA: Diagnosis not present

## 2018-03-23 DIAGNOSIS — N186 End stage renal disease: Secondary | ICD-10-CM | POA: Diagnosis not present

## 2018-03-23 DIAGNOSIS — Z4931 Encounter for adequacy testing for hemodialysis: Secondary | ICD-10-CM | POA: Diagnosis not present

## 2018-03-23 DIAGNOSIS — K7689 Other specified diseases of liver: Secondary | ICD-10-CM | POA: Diagnosis not present

## 2018-03-24 DIAGNOSIS — Z4931 Encounter for adequacy testing for hemodialysis: Secondary | ICD-10-CM | POA: Diagnosis not present

## 2018-03-24 DIAGNOSIS — R509 Fever, unspecified: Secondary | ICD-10-CM | POA: Diagnosis not present

## 2018-03-24 DIAGNOSIS — N186 End stage renal disease: Secondary | ICD-10-CM | POA: Diagnosis not present

## 2018-03-24 DIAGNOSIS — N2581 Secondary hyperparathyroidism of renal origin: Secondary | ICD-10-CM | POA: Diagnosis not present

## 2018-03-24 DIAGNOSIS — D631 Anemia in chronic kidney disease: Secondary | ICD-10-CM | POA: Diagnosis not present

## 2018-03-24 DIAGNOSIS — K7689 Other specified diseases of liver: Secondary | ICD-10-CM | POA: Diagnosis not present

## 2018-03-26 DIAGNOSIS — D631 Anemia in chronic kidney disease: Secondary | ICD-10-CM | POA: Diagnosis not present

## 2018-03-26 DIAGNOSIS — K7689 Other specified diseases of liver: Secondary | ICD-10-CM | POA: Diagnosis not present

## 2018-03-26 DIAGNOSIS — Z4931 Encounter for adequacy testing for hemodialysis: Secondary | ICD-10-CM | POA: Diagnosis not present

## 2018-03-26 DIAGNOSIS — R509 Fever, unspecified: Secondary | ICD-10-CM | POA: Diagnosis not present

## 2018-03-26 DIAGNOSIS — N186 End stage renal disease: Secondary | ICD-10-CM | POA: Diagnosis not present

## 2018-03-26 DIAGNOSIS — N2581 Secondary hyperparathyroidism of renal origin: Secondary | ICD-10-CM | POA: Diagnosis not present

## 2018-03-27 DIAGNOSIS — K7689 Other specified diseases of liver: Secondary | ICD-10-CM | POA: Diagnosis not present

## 2018-03-27 DIAGNOSIS — Z4931 Encounter for adequacy testing for hemodialysis: Secondary | ICD-10-CM | POA: Diagnosis not present

## 2018-03-27 DIAGNOSIS — N2581 Secondary hyperparathyroidism of renal origin: Secondary | ICD-10-CM | POA: Diagnosis not present

## 2018-03-27 DIAGNOSIS — N186 End stage renal disease: Secondary | ICD-10-CM | POA: Diagnosis not present

## 2018-03-27 DIAGNOSIS — D631 Anemia in chronic kidney disease: Secondary | ICD-10-CM | POA: Diagnosis not present

## 2018-03-27 DIAGNOSIS — R509 Fever, unspecified: Secondary | ICD-10-CM | POA: Diagnosis not present

## 2018-03-30 DIAGNOSIS — K7689 Other specified diseases of liver: Secondary | ICD-10-CM | POA: Diagnosis not present

## 2018-03-30 DIAGNOSIS — Z4931 Encounter for adequacy testing for hemodialysis: Secondary | ICD-10-CM | POA: Diagnosis not present

## 2018-03-30 DIAGNOSIS — N186 End stage renal disease: Secondary | ICD-10-CM | POA: Diagnosis not present

## 2018-03-30 DIAGNOSIS — D631 Anemia in chronic kidney disease: Secondary | ICD-10-CM | POA: Diagnosis not present

## 2018-03-30 DIAGNOSIS — N2581 Secondary hyperparathyroidism of renal origin: Secondary | ICD-10-CM | POA: Diagnosis not present

## 2018-03-30 DIAGNOSIS — R509 Fever, unspecified: Secondary | ICD-10-CM | POA: Diagnosis not present

## 2018-03-31 DIAGNOSIS — D631 Anemia in chronic kidney disease: Secondary | ICD-10-CM | POA: Diagnosis not present

## 2018-03-31 DIAGNOSIS — K7689 Other specified diseases of liver: Secondary | ICD-10-CM | POA: Diagnosis not present

## 2018-03-31 DIAGNOSIS — N186 End stage renal disease: Secondary | ICD-10-CM | POA: Diagnosis not present

## 2018-03-31 DIAGNOSIS — N2581 Secondary hyperparathyroidism of renal origin: Secondary | ICD-10-CM | POA: Diagnosis not present

## 2018-03-31 DIAGNOSIS — R509 Fever, unspecified: Secondary | ICD-10-CM | POA: Diagnosis not present

## 2018-03-31 DIAGNOSIS — Z4931 Encounter for adequacy testing for hemodialysis: Secondary | ICD-10-CM | POA: Diagnosis not present

## 2018-04-02 DIAGNOSIS — N186 End stage renal disease: Secondary | ICD-10-CM | POA: Diagnosis not present

## 2018-04-02 DIAGNOSIS — D631 Anemia in chronic kidney disease: Secondary | ICD-10-CM | POA: Diagnosis not present

## 2018-04-02 DIAGNOSIS — N2581 Secondary hyperparathyroidism of renal origin: Secondary | ICD-10-CM | POA: Diagnosis not present

## 2018-04-02 DIAGNOSIS — K7689 Other specified diseases of liver: Secondary | ICD-10-CM | POA: Diagnosis not present

## 2018-04-02 DIAGNOSIS — Z4931 Encounter for adequacy testing for hemodialysis: Secondary | ICD-10-CM | POA: Diagnosis not present

## 2018-04-02 DIAGNOSIS — R509 Fever, unspecified: Secondary | ICD-10-CM | POA: Diagnosis not present

## 2018-04-03 DIAGNOSIS — N186 End stage renal disease: Secondary | ICD-10-CM | POA: Diagnosis not present

## 2018-04-03 DIAGNOSIS — K7689 Other specified diseases of liver: Secondary | ICD-10-CM | POA: Diagnosis not present

## 2018-04-03 DIAGNOSIS — N2581 Secondary hyperparathyroidism of renal origin: Secondary | ICD-10-CM | POA: Diagnosis not present

## 2018-04-03 DIAGNOSIS — R509 Fever, unspecified: Secondary | ICD-10-CM | POA: Diagnosis not present

## 2018-04-03 DIAGNOSIS — Z4931 Encounter for adequacy testing for hemodialysis: Secondary | ICD-10-CM | POA: Diagnosis not present

## 2018-04-03 DIAGNOSIS — D631 Anemia in chronic kidney disease: Secondary | ICD-10-CM | POA: Diagnosis not present

## 2018-04-06 DIAGNOSIS — N186 End stage renal disease: Secondary | ICD-10-CM | POA: Diagnosis not present

## 2018-04-06 DIAGNOSIS — D631 Anemia in chronic kidney disease: Secondary | ICD-10-CM | POA: Diagnosis not present

## 2018-04-06 DIAGNOSIS — K7689 Other specified diseases of liver: Secondary | ICD-10-CM | POA: Diagnosis not present

## 2018-04-06 DIAGNOSIS — R509 Fever, unspecified: Secondary | ICD-10-CM | POA: Diagnosis not present

## 2018-04-06 DIAGNOSIS — N2581 Secondary hyperparathyroidism of renal origin: Secondary | ICD-10-CM | POA: Diagnosis not present

## 2018-04-06 DIAGNOSIS — Z4931 Encounter for adequacy testing for hemodialysis: Secondary | ICD-10-CM | POA: Diagnosis not present

## 2018-04-07 DIAGNOSIS — K7689 Other specified diseases of liver: Secondary | ICD-10-CM | POA: Diagnosis not present

## 2018-04-07 DIAGNOSIS — N2581 Secondary hyperparathyroidism of renal origin: Secondary | ICD-10-CM | POA: Diagnosis not present

## 2018-04-07 DIAGNOSIS — R509 Fever, unspecified: Secondary | ICD-10-CM | POA: Diagnosis not present

## 2018-04-07 DIAGNOSIS — N186 End stage renal disease: Secondary | ICD-10-CM | POA: Diagnosis not present

## 2018-04-07 DIAGNOSIS — D631 Anemia in chronic kidney disease: Secondary | ICD-10-CM | POA: Diagnosis not present

## 2018-04-07 DIAGNOSIS — Z4931 Encounter for adequacy testing for hemodialysis: Secondary | ICD-10-CM | POA: Diagnosis not present

## 2018-04-09 ENCOUNTER — Telehealth: Payer: Self-pay | Admitting: Family Medicine

## 2018-04-09 ENCOUNTER — Other Ambulatory Visit: Payer: Self-pay | Admitting: *Deleted

## 2018-04-09 DIAGNOSIS — Z4931 Encounter for adequacy testing for hemodialysis: Secondary | ICD-10-CM | POA: Diagnosis not present

## 2018-04-09 DIAGNOSIS — K7689 Other specified diseases of liver: Secondary | ICD-10-CM | POA: Diagnosis not present

## 2018-04-09 DIAGNOSIS — N2581 Secondary hyperparathyroidism of renal origin: Secondary | ICD-10-CM | POA: Diagnosis not present

## 2018-04-09 DIAGNOSIS — R509 Fever, unspecified: Secondary | ICD-10-CM | POA: Diagnosis not present

## 2018-04-09 DIAGNOSIS — N186 End stage renal disease: Secondary | ICD-10-CM | POA: Diagnosis not present

## 2018-04-09 DIAGNOSIS — D631 Anemia in chronic kidney disease: Secondary | ICD-10-CM | POA: Diagnosis not present

## 2018-04-09 MED ORDER — OSELTAMIVIR PHOSPHATE 75 MG PO CAPS: 75.0000 mg | ORAL_CAPSULE | Freq: Two times a day (BID) | ORAL | 0 refills | Status: DC

## 2018-04-09 NOTE — Telephone Encounter (Signed)
tamiflu 75 bid five d 

## 2018-04-09 NOTE — Telephone Encounter (Signed)
Patient's daughter was seen and diagnosed with flu, patient was instructed if he started to present symptoms of the flu to call office to get prescription of tamiflu patients symptoms are cough, sore throat, head drainage. Advise.   Pharmacy:  [facility] [place], [address]

## 2018-04-09 NOTE — Telephone Encounter (Signed)
Med sent to pharm. Pt notified.  

## 2018-04-09 NOTE — Telephone Encounter (Signed)
I called and left a message to see if he has a fever.Asked that he r/c.

## 2018-04-09 NOTE — Telephone Encounter (Signed)
Daughter was diagnosed with the flu this past Monday by [place]. He is not running a temp.

## 2018-04-10 ENCOUNTER — Ambulatory Visit: Payer: BLUE CROSS/BLUE SHIELD | Admitting: Family Medicine

## 2018-04-10 DIAGNOSIS — N2581 Secondary hyperparathyroidism of renal origin: Secondary | ICD-10-CM | POA: Diagnosis not present

## 2018-04-10 DIAGNOSIS — D631 Anemia in chronic kidney disease: Secondary | ICD-10-CM | POA: Diagnosis not present

## 2018-04-10 DIAGNOSIS — Z4931 Encounter for adequacy testing for hemodialysis: Secondary | ICD-10-CM | POA: Diagnosis not present

## 2018-04-10 DIAGNOSIS — R509 Fever, unspecified: Secondary | ICD-10-CM | POA: Diagnosis not present

## 2018-04-10 DIAGNOSIS — N186 End stage renal disease: Secondary | ICD-10-CM | POA: Diagnosis not present

## 2018-04-10 DIAGNOSIS — K7689 Other specified diseases of liver: Secondary | ICD-10-CM | POA: Diagnosis not present

## 2018-04-13 DIAGNOSIS — Z79899 Other long term (current) drug therapy: Secondary | ICD-10-CM | POA: Diagnosis not present

## 2018-04-13 DIAGNOSIS — N2581 Secondary hyperparathyroidism of renal origin: Secondary | ICD-10-CM | POA: Diagnosis not present

## 2018-04-13 DIAGNOSIS — N186 End stage renal disease: Secondary | ICD-10-CM | POA: Diagnosis not present

## 2018-04-13 DIAGNOSIS — Z4931 Encounter for adequacy testing for hemodialysis: Secondary | ICD-10-CM | POA: Diagnosis not present

## 2018-04-13 DIAGNOSIS — I120 Hypertensive chronic kidney disease with stage 5 chronic kidney disease or end stage renal disease: Secondary | ICD-10-CM | POA: Diagnosis not present

## 2018-04-13 DIAGNOSIS — E8779 Other fluid overload: Secondary | ICD-10-CM | POA: Diagnosis not present

## 2018-04-13 DIAGNOSIS — D631 Anemia in chronic kidney disease: Secondary | ICD-10-CM | POA: Diagnosis not present

## 2018-04-13 DIAGNOSIS — R17 Unspecified jaundice: Secondary | ICD-10-CM | POA: Diagnosis not present

## 2018-04-13 DIAGNOSIS — Z992 Dependence on renal dialysis: Secondary | ICD-10-CM | POA: Diagnosis not present

## 2018-04-13 DIAGNOSIS — E440 Moderate protein-calorie malnutrition: Secondary | ICD-10-CM | POA: Diagnosis not present

## 2018-04-13 DIAGNOSIS — K7689 Other specified diseases of liver: Secondary | ICD-10-CM | POA: Diagnosis not present

## 2018-04-13 DIAGNOSIS — R509 Fever, unspecified: Secondary | ICD-10-CM | POA: Diagnosis not present

## 2018-04-13 DIAGNOSIS — E1022 Type 1 diabetes mellitus with diabetic chronic kidney disease: Secondary | ICD-10-CM | POA: Diagnosis not present

## 2018-04-14 DIAGNOSIS — N186 End stage renal disease: Secondary | ICD-10-CM | POA: Diagnosis not present

## 2018-04-14 DIAGNOSIS — D631 Anemia in chronic kidney disease: Secondary | ICD-10-CM | POA: Diagnosis not present

## 2018-04-14 DIAGNOSIS — K7689 Other specified diseases of liver: Secondary | ICD-10-CM | POA: Diagnosis not present

## 2018-04-14 DIAGNOSIS — N2581 Secondary hyperparathyroidism of renal origin: Secondary | ICD-10-CM | POA: Diagnosis not present

## 2018-04-14 DIAGNOSIS — Z4931 Encounter for adequacy testing for hemodialysis: Secondary | ICD-10-CM | POA: Diagnosis not present

## 2018-04-14 DIAGNOSIS — Z79899 Other long term (current) drug therapy: Secondary | ICD-10-CM | POA: Diagnosis not present

## 2018-04-15 DIAGNOSIS — Z4931 Encounter for adequacy testing for hemodialysis: Secondary | ICD-10-CM | POA: Diagnosis not present

## 2018-04-15 DIAGNOSIS — Z79899 Other long term (current) drug therapy: Secondary | ICD-10-CM | POA: Diagnosis not present

## 2018-04-15 DIAGNOSIS — N2581 Secondary hyperparathyroidism of renal origin: Secondary | ICD-10-CM | POA: Diagnosis not present

## 2018-04-15 DIAGNOSIS — N186 End stage renal disease: Secondary | ICD-10-CM | POA: Diagnosis not present

## 2018-04-15 DIAGNOSIS — D631 Anemia in chronic kidney disease: Secondary | ICD-10-CM | POA: Diagnosis not present

## 2018-04-15 DIAGNOSIS — K7689 Other specified diseases of liver: Secondary | ICD-10-CM | POA: Diagnosis not present

## 2018-04-16 DIAGNOSIS — Z4931 Encounter for adequacy testing for hemodialysis: Secondary | ICD-10-CM | POA: Diagnosis not present

## 2018-04-16 DIAGNOSIS — N2581 Secondary hyperparathyroidism of renal origin: Secondary | ICD-10-CM | POA: Diagnosis not present

## 2018-04-16 DIAGNOSIS — D631 Anemia in chronic kidney disease: Secondary | ICD-10-CM | POA: Diagnosis not present

## 2018-04-16 DIAGNOSIS — N186 End stage renal disease: Secondary | ICD-10-CM | POA: Diagnosis not present

## 2018-04-16 DIAGNOSIS — K7689 Other specified diseases of liver: Secondary | ICD-10-CM | POA: Diagnosis not present

## 2018-04-16 DIAGNOSIS — Z79899 Other long term (current) drug therapy: Secondary | ICD-10-CM | POA: Diagnosis not present

## 2018-04-17 DIAGNOSIS — Z79899 Other long term (current) drug therapy: Secondary | ICD-10-CM | POA: Diagnosis not present

## 2018-04-17 DIAGNOSIS — N2581 Secondary hyperparathyroidism of renal origin: Secondary | ICD-10-CM | POA: Diagnosis not present

## 2018-04-17 DIAGNOSIS — N186 End stage renal disease: Secondary | ICD-10-CM | POA: Diagnosis not present

## 2018-04-17 DIAGNOSIS — Z4931 Encounter for adequacy testing for hemodialysis: Secondary | ICD-10-CM | POA: Diagnosis not present

## 2018-04-17 DIAGNOSIS — K7689 Other specified diseases of liver: Secondary | ICD-10-CM | POA: Diagnosis not present

## 2018-04-17 DIAGNOSIS — D631 Anemia in chronic kidney disease: Secondary | ICD-10-CM | POA: Diagnosis not present

## 2018-04-20 DIAGNOSIS — Z79899 Other long term (current) drug therapy: Secondary | ICD-10-CM | POA: Diagnosis not present

## 2018-04-20 DIAGNOSIS — Z4931 Encounter for adequacy testing for hemodialysis: Secondary | ICD-10-CM | POA: Diagnosis not present

## 2018-04-20 DIAGNOSIS — D631 Anemia in chronic kidney disease: Secondary | ICD-10-CM | POA: Diagnosis not present

## 2018-04-20 DIAGNOSIS — N2581 Secondary hyperparathyroidism of renal origin: Secondary | ICD-10-CM | POA: Diagnosis not present

## 2018-04-20 DIAGNOSIS — K7689 Other specified diseases of liver: Secondary | ICD-10-CM | POA: Diagnosis not present

## 2018-04-20 DIAGNOSIS — N186 End stage renal disease: Secondary | ICD-10-CM | POA: Diagnosis not present

## 2018-04-21 DIAGNOSIS — K7689 Other specified diseases of liver: Secondary | ICD-10-CM | POA: Diagnosis not present

## 2018-04-21 DIAGNOSIS — D631 Anemia in chronic kidney disease: Secondary | ICD-10-CM | POA: Diagnosis not present

## 2018-04-21 DIAGNOSIS — Z79899 Other long term (current) drug therapy: Secondary | ICD-10-CM | POA: Diagnosis not present

## 2018-04-21 DIAGNOSIS — N2581 Secondary hyperparathyroidism of renal origin: Secondary | ICD-10-CM | POA: Diagnosis not present

## 2018-04-21 DIAGNOSIS — N186 End stage renal disease: Secondary | ICD-10-CM | POA: Diagnosis not present

## 2018-04-21 DIAGNOSIS — Z4931 Encounter for adequacy testing for hemodialysis: Secondary | ICD-10-CM | POA: Diagnosis not present

## 2018-04-23 DIAGNOSIS — D631 Anemia in chronic kidney disease: Secondary | ICD-10-CM | POA: Diagnosis not present

## 2018-04-23 DIAGNOSIS — Z79899 Other long term (current) drug therapy: Secondary | ICD-10-CM | POA: Diagnosis not present

## 2018-04-23 DIAGNOSIS — K7689 Other specified diseases of liver: Secondary | ICD-10-CM | POA: Diagnosis not present

## 2018-04-23 DIAGNOSIS — Z4931 Encounter for adequacy testing for hemodialysis: Secondary | ICD-10-CM | POA: Diagnosis not present

## 2018-04-23 DIAGNOSIS — N2581 Secondary hyperparathyroidism of renal origin: Secondary | ICD-10-CM | POA: Diagnosis not present

## 2018-04-23 DIAGNOSIS — N186 End stage renal disease: Secondary | ICD-10-CM | POA: Diagnosis not present

## 2018-04-24 DIAGNOSIS — Z4931 Encounter for adequacy testing for hemodialysis: Secondary | ICD-10-CM | POA: Diagnosis not present

## 2018-04-24 DIAGNOSIS — N186 End stage renal disease: Secondary | ICD-10-CM | POA: Diagnosis not present

## 2018-04-24 DIAGNOSIS — Z79899 Other long term (current) drug therapy: Secondary | ICD-10-CM | POA: Diagnosis not present

## 2018-04-24 DIAGNOSIS — N2581 Secondary hyperparathyroidism of renal origin: Secondary | ICD-10-CM | POA: Diagnosis not present

## 2018-04-24 DIAGNOSIS — K7689 Other specified diseases of liver: Secondary | ICD-10-CM | POA: Diagnosis not present

## 2018-04-24 DIAGNOSIS — D631 Anemia in chronic kidney disease: Secondary | ICD-10-CM | POA: Diagnosis not present

## 2018-04-27 DIAGNOSIS — N186 End stage renal disease: Secondary | ICD-10-CM | POA: Diagnosis not present

## 2018-04-27 DIAGNOSIS — K7689 Other specified diseases of liver: Secondary | ICD-10-CM | POA: Diagnosis not present

## 2018-04-27 DIAGNOSIS — D631 Anemia in chronic kidney disease: Secondary | ICD-10-CM | POA: Diagnosis not present

## 2018-04-27 DIAGNOSIS — N2581 Secondary hyperparathyroidism of renal origin: Secondary | ICD-10-CM | POA: Diagnosis not present

## 2018-04-27 DIAGNOSIS — Z79899 Other long term (current) drug therapy: Secondary | ICD-10-CM | POA: Diagnosis not present

## 2018-04-27 DIAGNOSIS — Z4931 Encounter for adequacy testing for hemodialysis: Secondary | ICD-10-CM | POA: Diagnosis not present

## 2018-04-28 DIAGNOSIS — N2581 Secondary hyperparathyroidism of renal origin: Secondary | ICD-10-CM | POA: Diagnosis not present

## 2018-04-28 DIAGNOSIS — D631 Anemia in chronic kidney disease: Secondary | ICD-10-CM | POA: Diagnosis not present

## 2018-04-28 DIAGNOSIS — N186 End stage renal disease: Secondary | ICD-10-CM | POA: Diagnosis not present

## 2018-04-28 DIAGNOSIS — Z4931 Encounter for adequacy testing for hemodialysis: Secondary | ICD-10-CM | POA: Diagnosis not present

## 2018-04-28 DIAGNOSIS — K7689 Other specified diseases of liver: Secondary | ICD-10-CM | POA: Diagnosis not present

## 2018-04-28 DIAGNOSIS — Z79899 Other long term (current) drug therapy: Secondary | ICD-10-CM | POA: Diagnosis not present

## 2018-04-30 DIAGNOSIS — Z79899 Other long term (current) drug therapy: Secondary | ICD-10-CM | POA: Diagnosis not present

## 2018-04-30 DIAGNOSIS — N186 End stage renal disease: Secondary | ICD-10-CM | POA: Diagnosis not present

## 2018-04-30 DIAGNOSIS — K7689 Other specified diseases of liver: Secondary | ICD-10-CM | POA: Diagnosis not present

## 2018-04-30 DIAGNOSIS — Z4931 Encounter for adequacy testing for hemodialysis: Secondary | ICD-10-CM | POA: Diagnosis not present

## 2018-04-30 DIAGNOSIS — N2581 Secondary hyperparathyroidism of renal origin: Secondary | ICD-10-CM | POA: Diagnosis not present

## 2018-04-30 DIAGNOSIS — D631 Anemia in chronic kidney disease: Secondary | ICD-10-CM | POA: Diagnosis not present

## 2018-05-01 DIAGNOSIS — N186 End stage renal disease: Secondary | ICD-10-CM | POA: Diagnosis not present

## 2018-05-01 DIAGNOSIS — D631 Anemia in chronic kidney disease: Secondary | ICD-10-CM | POA: Diagnosis not present

## 2018-05-01 DIAGNOSIS — N2581 Secondary hyperparathyroidism of renal origin: Secondary | ICD-10-CM | POA: Diagnosis not present

## 2018-05-01 DIAGNOSIS — Z4931 Encounter for adequacy testing for hemodialysis: Secondary | ICD-10-CM | POA: Diagnosis not present

## 2018-05-01 DIAGNOSIS — K7689 Other specified diseases of liver: Secondary | ICD-10-CM | POA: Diagnosis not present

## 2018-05-01 DIAGNOSIS — Z79899 Other long term (current) drug therapy: Secondary | ICD-10-CM | POA: Diagnosis not present

## 2018-05-04 ENCOUNTER — Other Ambulatory Visit: Payer: Self-pay

## 2018-05-04 DIAGNOSIS — D631 Anemia in chronic kidney disease: Secondary | ICD-10-CM | POA: Diagnosis not present

## 2018-05-04 DIAGNOSIS — N186 End stage renal disease: Secondary | ICD-10-CM | POA: Diagnosis not present

## 2018-05-04 DIAGNOSIS — K7689 Other specified diseases of liver: Secondary | ICD-10-CM | POA: Diagnosis not present

## 2018-05-04 DIAGNOSIS — Z79899 Other long term (current) drug therapy: Secondary | ICD-10-CM | POA: Diagnosis not present

## 2018-05-04 DIAGNOSIS — N2581 Secondary hyperparathyroidism of renal origin: Secondary | ICD-10-CM | POA: Diagnosis not present

## 2018-05-04 DIAGNOSIS — Z4931 Encounter for adequacy testing for hemodialysis: Secondary | ICD-10-CM | POA: Diagnosis not present

## 2018-05-04 MED ORDER — INSULIN GLARGINE 100 UNIT/ML SOLOSTAR PEN: 34.0000 [IU] | PEN_INJECTOR | Freq: Every day | SUBCUTANEOUS | 0 refills | Status: DC

## 2018-05-05 DIAGNOSIS — N186 End stage renal disease: Secondary | ICD-10-CM | POA: Diagnosis not present

## 2018-05-05 DIAGNOSIS — Z4931 Encounter for adequacy testing for hemodialysis: Secondary | ICD-10-CM | POA: Diagnosis not present

## 2018-05-05 DIAGNOSIS — N2581 Secondary hyperparathyroidism of renal origin: Secondary | ICD-10-CM | POA: Diagnosis not present

## 2018-05-05 DIAGNOSIS — K7689 Other specified diseases of liver: Secondary | ICD-10-CM | POA: Diagnosis not present

## 2018-05-05 DIAGNOSIS — Z79899 Other long term (current) drug therapy: Secondary | ICD-10-CM | POA: Diagnosis not present

## 2018-05-05 DIAGNOSIS — D631 Anemia in chronic kidney disease: Secondary | ICD-10-CM | POA: Diagnosis not present

## 2018-05-07 ENCOUNTER — Other Ambulatory Visit: Payer: Self-pay

## 2018-05-07 DIAGNOSIS — Z79899 Other long term (current) drug therapy: Secondary | ICD-10-CM | POA: Diagnosis not present

## 2018-05-07 DIAGNOSIS — N186 End stage renal disease: Secondary | ICD-10-CM | POA: Diagnosis not present

## 2018-05-07 DIAGNOSIS — K7689 Other specified diseases of liver: Secondary | ICD-10-CM | POA: Diagnosis not present

## 2018-05-07 DIAGNOSIS — Z4931 Encounter for adequacy testing for hemodialysis: Secondary | ICD-10-CM | POA: Diagnosis not present

## 2018-05-07 DIAGNOSIS — N2581 Secondary hyperparathyroidism of renal origin: Secondary | ICD-10-CM | POA: Diagnosis not present

## 2018-05-07 DIAGNOSIS — D631 Anemia in chronic kidney disease: Secondary | ICD-10-CM | POA: Diagnosis not present

## 2018-05-07 MED ORDER — INSULIN GLARGINE 100 UNIT/ML SOLOSTAR PEN: 34.0000 [IU] | PEN_INJECTOR | Freq: Every day | SUBCUTANEOUS | 0 refills | Status: DC

## 2018-05-08 DIAGNOSIS — N2581 Secondary hyperparathyroidism of renal origin: Secondary | ICD-10-CM | POA: Diagnosis not present

## 2018-05-08 DIAGNOSIS — K7689 Other specified diseases of liver: Secondary | ICD-10-CM | POA: Diagnosis not present

## 2018-05-08 DIAGNOSIS — Z4931 Encounter for adequacy testing for hemodialysis: Secondary | ICD-10-CM | POA: Diagnosis not present

## 2018-05-08 DIAGNOSIS — D631 Anemia in chronic kidney disease: Secondary | ICD-10-CM | POA: Diagnosis not present

## 2018-05-08 DIAGNOSIS — N186 End stage renal disease: Secondary | ICD-10-CM | POA: Diagnosis not present

## 2018-05-08 DIAGNOSIS — Z79899 Other long term (current) drug therapy: Secondary | ICD-10-CM | POA: Diagnosis not present

## 2018-05-11 DIAGNOSIS — Z4931 Encounter for adequacy testing for hemodialysis: Secondary | ICD-10-CM | POA: Diagnosis not present

## 2018-05-11 DIAGNOSIS — N186 End stage renal disease: Secondary | ICD-10-CM | POA: Diagnosis not present

## 2018-05-11 DIAGNOSIS — N2581 Secondary hyperparathyroidism of renal origin: Secondary | ICD-10-CM | POA: Diagnosis not present

## 2018-05-11 DIAGNOSIS — K7689 Other specified diseases of liver: Secondary | ICD-10-CM | POA: Diagnosis not present

## 2018-05-11 DIAGNOSIS — Z79899 Other long term (current) drug therapy: Secondary | ICD-10-CM | POA: Diagnosis not present

## 2018-05-11 DIAGNOSIS — D631 Anemia in chronic kidney disease: Secondary | ICD-10-CM | POA: Diagnosis not present

## 2018-05-12 DIAGNOSIS — N2581 Secondary hyperparathyroidism of renal origin: Secondary | ICD-10-CM | POA: Diagnosis not present

## 2018-05-12 DIAGNOSIS — N186 End stage renal disease: Secondary | ICD-10-CM | POA: Diagnosis not present

## 2018-05-12 DIAGNOSIS — Z992 Dependence on renal dialysis: Secondary | ICD-10-CM | POA: Diagnosis not present

## 2018-05-12 DIAGNOSIS — Z79899 Other long term (current) drug therapy: Secondary | ICD-10-CM | POA: Diagnosis not present

## 2018-05-12 DIAGNOSIS — Z4931 Encounter for adequacy testing for hemodialysis: Secondary | ICD-10-CM | POA: Diagnosis not present

## 2018-05-12 DIAGNOSIS — D631 Anemia in chronic kidney disease: Secondary | ICD-10-CM | POA: Diagnosis not present

## 2018-05-12 DIAGNOSIS — E1022 Type 1 diabetes mellitus with diabetic chronic kidney disease: Secondary | ICD-10-CM | POA: Diagnosis not present

## 2018-05-12 DIAGNOSIS — K7689 Other specified diseases of liver: Secondary | ICD-10-CM | POA: Diagnosis not present

## 2018-05-14 DIAGNOSIS — N2581 Secondary hyperparathyroidism of renal origin: Secondary | ICD-10-CM | POA: Diagnosis not present

## 2018-05-14 DIAGNOSIS — I120 Hypertensive chronic kidney disease with stage 5 chronic kidney disease or end stage renal disease: Secondary | ICD-10-CM | POA: Diagnosis not present

## 2018-05-14 DIAGNOSIS — Z4931 Encounter for adequacy testing for hemodialysis: Secondary | ICD-10-CM | POA: Diagnosis not present

## 2018-05-14 DIAGNOSIS — N2589 Other disorders resulting from impaired renal tubular function: Secondary | ICD-10-CM | POA: Diagnosis not present

## 2018-05-14 DIAGNOSIS — K7689 Other specified diseases of liver: Secondary | ICD-10-CM | POA: Diagnosis not present

## 2018-05-14 DIAGNOSIS — D631 Anemia in chronic kidney disease: Secondary | ICD-10-CM | POA: Diagnosis not present

## 2018-05-14 DIAGNOSIS — E8779 Other fluid overload: Secondary | ICD-10-CM | POA: Diagnosis not present

## 2018-05-14 DIAGNOSIS — Z992 Dependence on renal dialysis: Secondary | ICD-10-CM | POA: Diagnosis not present

## 2018-05-14 DIAGNOSIS — N186 End stage renal disease: Secondary | ICD-10-CM | POA: Diagnosis not present

## 2018-05-14 DIAGNOSIS — R509 Fever, unspecified: Secondary | ICD-10-CM | POA: Diagnosis not present

## 2018-05-14 DIAGNOSIS — E1022 Type 1 diabetes mellitus with diabetic chronic kidney disease: Secondary | ICD-10-CM | POA: Diagnosis not present

## 2018-05-15 DIAGNOSIS — D631 Anemia in chronic kidney disease: Secondary | ICD-10-CM | POA: Diagnosis not present

## 2018-05-15 DIAGNOSIS — N186 End stage renal disease: Secondary | ICD-10-CM | POA: Diagnosis not present

## 2018-05-15 DIAGNOSIS — N2581 Secondary hyperparathyroidism of renal origin: Secondary | ICD-10-CM | POA: Diagnosis not present

## 2018-05-15 DIAGNOSIS — Z4931 Encounter for adequacy testing for hemodialysis: Secondary | ICD-10-CM | POA: Diagnosis not present

## 2018-05-15 DIAGNOSIS — R509 Fever, unspecified: Secondary | ICD-10-CM | POA: Diagnosis not present

## 2018-05-15 DIAGNOSIS — K7689 Other specified diseases of liver: Secondary | ICD-10-CM | POA: Diagnosis not present

## 2018-05-18 DIAGNOSIS — K7689 Other specified diseases of liver: Secondary | ICD-10-CM | POA: Diagnosis not present

## 2018-05-18 DIAGNOSIS — D631 Anemia in chronic kidney disease: Secondary | ICD-10-CM | POA: Diagnosis not present

## 2018-05-18 DIAGNOSIS — R509 Fever, unspecified: Secondary | ICD-10-CM | POA: Diagnosis not present

## 2018-05-18 DIAGNOSIS — E7849 Other hyperlipidemia: Secondary | ICD-10-CM | POA: Diagnosis not present

## 2018-05-18 DIAGNOSIS — Z4931 Encounter for adequacy testing for hemodialysis: Secondary | ICD-10-CM | POA: Diagnosis not present

## 2018-05-18 DIAGNOSIS — N2581 Secondary hyperparathyroidism of renal origin: Secondary | ICD-10-CM | POA: Diagnosis not present

## 2018-05-18 DIAGNOSIS — E1022 Type 1 diabetes mellitus with diabetic chronic kidney disease: Secondary | ICD-10-CM | POA: Diagnosis not present

## 2018-05-18 DIAGNOSIS — N186 End stage renal disease: Secondary | ICD-10-CM | POA: Diagnosis not present

## 2018-05-19 DIAGNOSIS — Z4931 Encounter for adequacy testing for hemodialysis: Secondary | ICD-10-CM | POA: Diagnosis not present

## 2018-05-19 DIAGNOSIS — K7689 Other specified diseases of liver: Secondary | ICD-10-CM | POA: Diagnosis not present

## 2018-05-19 DIAGNOSIS — R509 Fever, unspecified: Secondary | ICD-10-CM | POA: Diagnosis not present

## 2018-05-19 DIAGNOSIS — N186 End stage renal disease: Secondary | ICD-10-CM | POA: Diagnosis not present

## 2018-05-19 DIAGNOSIS — N2581 Secondary hyperparathyroidism of renal origin: Secondary | ICD-10-CM | POA: Diagnosis not present

## 2018-05-19 DIAGNOSIS — D631 Anemia in chronic kidney disease: Secondary | ICD-10-CM | POA: Diagnosis not present

## 2018-05-21 DIAGNOSIS — R509 Fever, unspecified: Secondary | ICD-10-CM | POA: Diagnosis not present

## 2018-05-21 DIAGNOSIS — Z4931 Encounter for adequacy testing for hemodialysis: Secondary | ICD-10-CM | POA: Diagnosis not present

## 2018-05-21 DIAGNOSIS — N186 End stage renal disease: Secondary | ICD-10-CM | POA: Diagnosis not present

## 2018-05-21 DIAGNOSIS — D631 Anemia in chronic kidney disease: Secondary | ICD-10-CM | POA: Diagnosis not present

## 2018-05-21 DIAGNOSIS — K7689 Other specified diseases of liver: Secondary | ICD-10-CM | POA: Diagnosis not present

## 2018-05-21 DIAGNOSIS — N2581 Secondary hyperparathyroidism of renal origin: Secondary | ICD-10-CM | POA: Diagnosis not present

## 2018-05-22 DIAGNOSIS — N186 End stage renal disease: Secondary | ICD-10-CM | POA: Diagnosis not present

## 2018-05-22 DIAGNOSIS — D631 Anemia in chronic kidney disease: Secondary | ICD-10-CM | POA: Diagnosis not present

## 2018-05-22 DIAGNOSIS — N2581 Secondary hyperparathyroidism of renal origin: Secondary | ICD-10-CM | POA: Diagnosis not present

## 2018-05-22 DIAGNOSIS — K7689 Other specified diseases of liver: Secondary | ICD-10-CM | POA: Diagnosis not present

## 2018-05-22 DIAGNOSIS — R509 Fever, unspecified: Secondary | ICD-10-CM | POA: Diagnosis not present

## 2018-05-22 DIAGNOSIS — Z4931 Encounter for adequacy testing for hemodialysis: Secondary | ICD-10-CM | POA: Diagnosis not present

## 2018-05-25 DIAGNOSIS — N2581 Secondary hyperparathyroidism of renal origin: Secondary | ICD-10-CM | POA: Diagnosis not present

## 2018-05-25 DIAGNOSIS — D631 Anemia in chronic kidney disease: Secondary | ICD-10-CM | POA: Diagnosis not present

## 2018-05-25 DIAGNOSIS — Z4931 Encounter for adequacy testing for hemodialysis: Secondary | ICD-10-CM | POA: Diagnosis not present

## 2018-05-25 DIAGNOSIS — R509 Fever, unspecified: Secondary | ICD-10-CM | POA: Diagnosis not present

## 2018-05-25 DIAGNOSIS — N186 End stage renal disease: Secondary | ICD-10-CM | POA: Diagnosis not present

## 2018-05-25 DIAGNOSIS — K7689 Other specified diseases of liver: Secondary | ICD-10-CM | POA: Diagnosis not present

## 2018-05-26 DIAGNOSIS — D631 Anemia in chronic kidney disease: Secondary | ICD-10-CM | POA: Diagnosis not present

## 2018-05-26 DIAGNOSIS — N2581 Secondary hyperparathyroidism of renal origin: Secondary | ICD-10-CM | POA: Diagnosis not present

## 2018-05-26 DIAGNOSIS — K7689 Other specified diseases of liver: Secondary | ICD-10-CM | POA: Diagnosis not present

## 2018-05-26 DIAGNOSIS — R509 Fever, unspecified: Secondary | ICD-10-CM | POA: Diagnosis not present

## 2018-05-26 DIAGNOSIS — Z4931 Encounter for adequacy testing for hemodialysis: Secondary | ICD-10-CM | POA: Diagnosis not present

## 2018-05-26 DIAGNOSIS — N186 End stage renal disease: Secondary | ICD-10-CM | POA: Diagnosis not present

## 2018-05-28 DIAGNOSIS — N186 End stage renal disease: Secondary | ICD-10-CM | POA: Diagnosis not present

## 2018-05-28 DIAGNOSIS — R509 Fever, unspecified: Secondary | ICD-10-CM | POA: Diagnosis not present

## 2018-05-28 DIAGNOSIS — N2581 Secondary hyperparathyroidism of renal origin: Secondary | ICD-10-CM | POA: Diagnosis not present

## 2018-05-28 DIAGNOSIS — Z4931 Encounter for adequacy testing for hemodialysis: Secondary | ICD-10-CM | POA: Diagnosis not present

## 2018-05-28 DIAGNOSIS — K7689 Other specified diseases of liver: Secondary | ICD-10-CM | POA: Diagnosis not present

## 2018-05-28 DIAGNOSIS — D631 Anemia in chronic kidney disease: Secondary | ICD-10-CM | POA: Diagnosis not present

## 2018-05-29 DIAGNOSIS — N2581 Secondary hyperparathyroidism of renal origin: Secondary | ICD-10-CM | POA: Diagnosis not present

## 2018-05-29 DIAGNOSIS — Z4931 Encounter for adequacy testing for hemodialysis: Secondary | ICD-10-CM | POA: Diagnosis not present

## 2018-05-29 DIAGNOSIS — N186 End stage renal disease: Secondary | ICD-10-CM | POA: Diagnosis not present

## 2018-05-29 DIAGNOSIS — R509 Fever, unspecified: Secondary | ICD-10-CM | POA: Diagnosis not present

## 2018-05-29 DIAGNOSIS — D631 Anemia in chronic kidney disease: Secondary | ICD-10-CM | POA: Diagnosis not present

## 2018-05-29 DIAGNOSIS — K7689 Other specified diseases of liver: Secondary | ICD-10-CM | POA: Diagnosis not present

## 2018-06-01 DIAGNOSIS — K7689 Other specified diseases of liver: Secondary | ICD-10-CM | POA: Diagnosis not present

## 2018-06-01 DIAGNOSIS — N2581 Secondary hyperparathyroidism of renal origin: Secondary | ICD-10-CM | POA: Diagnosis not present

## 2018-06-01 DIAGNOSIS — N186 End stage renal disease: Secondary | ICD-10-CM | POA: Diagnosis not present

## 2018-06-01 DIAGNOSIS — R509 Fever, unspecified: Secondary | ICD-10-CM | POA: Diagnosis not present

## 2018-06-01 DIAGNOSIS — Z4931 Encounter for adequacy testing for hemodialysis: Secondary | ICD-10-CM | POA: Diagnosis not present

## 2018-06-01 DIAGNOSIS — D631 Anemia in chronic kidney disease: Secondary | ICD-10-CM | POA: Diagnosis not present

## 2018-06-02 DIAGNOSIS — K7689 Other specified diseases of liver: Secondary | ICD-10-CM | POA: Diagnosis not present

## 2018-06-02 DIAGNOSIS — D631 Anemia in chronic kidney disease: Secondary | ICD-10-CM | POA: Diagnosis not present

## 2018-06-02 DIAGNOSIS — R509 Fever, unspecified: Secondary | ICD-10-CM | POA: Diagnosis not present

## 2018-06-02 DIAGNOSIS — N2581 Secondary hyperparathyroidism of renal origin: Secondary | ICD-10-CM | POA: Diagnosis not present

## 2018-06-02 DIAGNOSIS — N186 End stage renal disease: Secondary | ICD-10-CM | POA: Diagnosis not present

## 2018-06-02 DIAGNOSIS — Z4931 Encounter for adequacy testing for hemodialysis: Secondary | ICD-10-CM | POA: Diagnosis not present

## 2018-06-03 ENCOUNTER — Ambulatory Visit: Payer: BLUE CROSS/BLUE SHIELD | Admitting: "Endocrinology

## 2018-06-03 ENCOUNTER — Other Ambulatory Visit: Payer: Self-pay | Admitting: "Endocrinology

## 2018-06-03 DIAGNOSIS — N186 End stage renal disease: Secondary | ICD-10-CM | POA: Diagnosis not present

## 2018-06-03 DIAGNOSIS — E1022 Type 1 diabetes mellitus with diabetic chronic kidney disease: Secondary | ICD-10-CM

## 2018-06-03 DIAGNOSIS — E559 Vitamin D deficiency, unspecified: Secondary | ICD-10-CM

## 2018-06-03 DIAGNOSIS — E782 Mixed hyperlipidemia: Secondary | ICD-10-CM | POA: Diagnosis not present

## 2018-06-04 DIAGNOSIS — K7689 Other specified diseases of liver: Secondary | ICD-10-CM | POA: Diagnosis not present

## 2018-06-04 DIAGNOSIS — N2581 Secondary hyperparathyroidism of renal origin: Secondary | ICD-10-CM | POA: Diagnosis not present

## 2018-06-04 DIAGNOSIS — Z4931 Encounter for adequacy testing for hemodialysis: Secondary | ICD-10-CM | POA: Diagnosis not present

## 2018-06-04 DIAGNOSIS — R509 Fever, unspecified: Secondary | ICD-10-CM | POA: Diagnosis not present

## 2018-06-04 DIAGNOSIS — D631 Anemia in chronic kidney disease: Secondary | ICD-10-CM | POA: Diagnosis not present

## 2018-06-04 DIAGNOSIS — N186 End stage renal disease: Secondary | ICD-10-CM | POA: Diagnosis not present

## 2018-06-04 LAB — HEMOGLOBIN A1C
Est. average glucose Bld gHb Est-mCnc: 189 mg/dL
Hgb A1c MFr Bld: 8.2 % — ABNORMAL HIGH (ref 4.8–5.6)

## 2018-06-04 LAB — COMPREHENSIVE METABOLIC PANEL
ALT: 12 IU/L (ref 0–44)
AST: 10 IU/L (ref 0–40)
Albumin/Globulin Ratio: 1.2 (ref 1.2–2.2)
Albumin: 4.4 g/dL (ref 4.0–5.0)
Alkaline Phosphatase: 128 IU/L — ABNORMAL HIGH (ref 39–117)
BUN/Creatinine Ratio: 4 — ABNORMAL LOW (ref 9–20)
BUN: 35 mg/dL — ABNORMAL HIGH (ref 6–24)
Bilirubin Total: 0.4 mg/dL (ref 0.0–1.2)
CO2: 21 mmol/L (ref 20–29)
Calcium: 9.5 mg/dL (ref 8.7–10.2)
Chloride: 92 mmol/L — ABNORMAL LOW (ref 96–106)
Creatinine, Ser: 9.04 mg/dL — ABNORMAL HIGH (ref 0.76–1.27)
GFR calc Af Amer: 7 mL/min/{1.73_m2} — ABNORMAL LOW (ref >59)
GFR calc non Af Amer: 6 mL/min/{1.73_m2} — ABNORMAL LOW (ref >59)
Globulin, Total: 3.6 g/dL (ref 1.5–4.5)
Glucose: 339 mg/dL — ABNORMAL HIGH (ref 65–99)
Potassium: 5.1 mmol/L (ref 3.5–5.2)
Sodium: 135 mmol/L (ref 134–144)
Total Protein: 8 g/dL (ref 6.0–8.5)

## 2018-06-04 LAB — LIPID PANEL
Chol/HDL Ratio: 4.2 ratio (ref 0.0–5.0)
Cholesterol, Total: 220 mg/dL — ABNORMAL HIGH (ref 100–199)
HDL: 53 mg/dL (ref >39)
LDL Calculated: 140 mg/dL — ABNORMAL HIGH (ref 0–99)
Triglycerides: 134 mg/dL (ref 0–149)
VLDL Cholesterol Cal: 27 mg/dL (ref 5–40)

## 2018-06-04 LAB — VITAMIN D 25 HYDROXY (VIT D DEFICIENCY, FRACTURES): Vit D, 25-Hydroxy: 22.3 ng/mL — ABNORMAL LOW (ref 30.0–100.0)

## 2018-06-05 DIAGNOSIS — N2581 Secondary hyperparathyroidism of renal origin: Secondary | ICD-10-CM | POA: Diagnosis not present

## 2018-06-05 DIAGNOSIS — N186 End stage renal disease: Secondary | ICD-10-CM | POA: Diagnosis not present

## 2018-06-05 DIAGNOSIS — R509 Fever, unspecified: Secondary | ICD-10-CM | POA: Diagnosis not present

## 2018-06-05 DIAGNOSIS — Z4931 Encounter for adequacy testing for hemodialysis: Secondary | ICD-10-CM | POA: Diagnosis not present

## 2018-06-05 DIAGNOSIS — D631 Anemia in chronic kidney disease: Secondary | ICD-10-CM | POA: Diagnosis not present

## 2018-06-05 DIAGNOSIS — K7689 Other specified diseases of liver: Secondary | ICD-10-CM | POA: Diagnosis not present

## 2018-06-08 ENCOUNTER — Other Ambulatory Visit: Payer: Self-pay

## 2018-06-08 ENCOUNTER — Ambulatory Visit: Payer: BLUE CROSS/BLUE SHIELD | Admitting: "Endocrinology

## 2018-06-08 DIAGNOSIS — K7689 Other specified diseases of liver: Secondary | ICD-10-CM | POA: Diagnosis not present

## 2018-06-08 DIAGNOSIS — N186 End stage renal disease: Secondary | ICD-10-CM | POA: Diagnosis not present

## 2018-06-08 DIAGNOSIS — Z4931 Encounter for adequacy testing for hemodialysis: Secondary | ICD-10-CM | POA: Diagnosis not present

## 2018-06-08 DIAGNOSIS — R509 Fever, unspecified: Secondary | ICD-10-CM | POA: Diagnosis not present

## 2018-06-08 DIAGNOSIS — N2581 Secondary hyperparathyroidism of renal origin: Secondary | ICD-10-CM | POA: Diagnosis not present

## 2018-06-08 DIAGNOSIS — D631 Anemia in chronic kidney disease: Secondary | ICD-10-CM | POA: Diagnosis not present

## 2018-06-09 ENCOUNTER — Other Ambulatory Visit: Payer: Self-pay

## 2018-06-09 ENCOUNTER — Encounter: Payer: Self-pay | Admitting: "Endocrinology

## 2018-06-09 ENCOUNTER — Ambulatory Visit (INDEPENDENT_AMBULATORY_CARE_PROVIDER_SITE_OTHER): Payer: Medicare Other | Admitting: "Endocrinology

## 2018-06-09 DIAGNOSIS — D631 Anemia in chronic kidney disease: Secondary | ICD-10-CM | POA: Diagnosis not present

## 2018-06-09 DIAGNOSIS — I10 Essential (primary) hypertension: Secondary | ICD-10-CM | POA: Diagnosis not present

## 2018-06-09 DIAGNOSIS — E559 Vitamin D deficiency, unspecified: Secondary | ICD-10-CM

## 2018-06-09 DIAGNOSIS — N2581 Secondary hyperparathyroidism of renal origin: Secondary | ICD-10-CM | POA: Diagnosis not present

## 2018-06-09 DIAGNOSIS — E782 Mixed hyperlipidemia: Secondary | ICD-10-CM

## 2018-06-09 DIAGNOSIS — Z4931 Encounter for adequacy testing for hemodialysis: Secondary | ICD-10-CM | POA: Diagnosis not present

## 2018-06-09 DIAGNOSIS — E1022 Type 1 diabetes mellitus with diabetic chronic kidney disease: Secondary | ICD-10-CM | POA: Diagnosis not present

## 2018-06-09 DIAGNOSIS — N186 End stage renal disease: Secondary | ICD-10-CM

## 2018-06-09 DIAGNOSIS — R509 Fever, unspecified: Secondary | ICD-10-CM | POA: Diagnosis not present

## 2018-06-09 DIAGNOSIS — K7689 Other specified diseases of liver: Secondary | ICD-10-CM | POA: Diagnosis not present

## 2018-06-09 NOTE — Progress Notes (Signed)
06/09/2018, 9:08 PM                                                    Endocrinology Telehealth Visit Follow up Note -During COVID -19 Pandemic  This visit type was conducted due to national recommendations for restrictions regarding the COVID-19 Pandemic  in an effort to limit this patient's exposure and mitigate transmission of the corona virus.  Due to his co-morbid illnesses, Lance [name] is at  moderate to high risk for complications without adequate follow up.  This format is felt to be most appropriate for him at this time.  I connected with this patient on 06/09/2018   by telephone and verified that I am speaking with the correct person using two identifiers. Lance [name], 05-12-1970. he has verbally consented to this visit. All issues noted in this document were discussed and addressed. The format was not optimal for physical exam.    Subjective:    Patient ID: Lance [name], male    DOB: June 03, 1970.  Lance [name] is being engaged in telehealth in follow-up  for management of currently uncontrolled, complicated, symptomatic type 1 diabetes, hyperlipidemia, hypertension. PMD:   [name], MD.   Past Medical History:  Diagnosis Date  . Aortic stenosis    Very mild in 05/2007  . Arthritis    "right shoulder; right knee; feel like I've got it all over" (11/28/2015)  . CHF (congestive heart failure) ([place])   . Degenerative joint disease    Left TKA  . Diastolic heart failure (HCC)    LVEF 65-70% with grade 2 diastolic dysfunction  . ESRD (end stage renal disease) on dialysis [facility])    (as of 09/24/2017) pt does HD at home on a M/Tu/Th/F schedule.  . Essential hypertension, benign 2000   LVH  . GERD (gastroesophageal reflux disease)   . HCAP (healthcare-associated pneumonia) 11/28/2015  . Hyperlipidemia 2000  . Loculated pleural effusion 11/28/2015   [name] 11/28/2015  . Pneumonia 12/2013; 05/2014  . PSVT  (paroxysmal supraventricular tachycardia) (HCC)    Nonsustained; asymptomatic; diagnosed by event recorder in 2006  . Tobacco abuse, in remission    20 pack years; discontinued in 1985; bronchitic changes on chest x-ray and 2009  . Type 1 diabetes mellitus ([place]) 1990   Past Surgical History:  Procedure Laterality Date  . AMPUTATION Right 06/06/2017   Procedure: RIGHT BELOW KNEE AMPUTATION;  Surgeon: [name], MD;  Location: [place];  Service: Orthopedics;  Laterality: Right;  . APPLICATION OF WOUND VAC Right 07/25/2017   Procedure: APPLICATION OF WOUND VAC;  Surgeon: [name], MD;  Location: [place];  Service: Orthopedics;  Laterality: Right;  . AV FISTULA PLACEMENT Right 06/23/2014   Procedure: ARTERIOVENOUS (AV) FISTULA CREATION;  Surgeon: Angelia Mould, MD;  Location: [place];  Service: Vascular;  Laterality: Right;  . CARDIAC CATHETERIZATION     [facility]- evaluation for Kidney/ pancreas transplant  . EYE SURGERY Bilateral    "for glaucoma"  . INGUINAL HERNIA REPAIR Right    As a child  . IR DIALY SHUNT INTRO NEEDLE/INTRACATH INITIAL W/IMG RIGHT Right 09/29/2017  . LOWER EXTREMITY ANGIOGRAPHY N/A 05/27/2017   Procedure: LOWER EXTREMITY ANGIOGRAPHY;  Surgeon: [name], MD;  Location: [place] [facility];  Service: Cardiovascular;  Laterality: N/A;  . STUMP REVISION Right 07/25/2017   Procedure: REVISION RIGHT BELOW KNEE AMPUTATION;;  Surgeon: [name], MD;  Location: [place];  Service: Orthopedics;  Laterality: Right;  . WISDOM TOOTH EXTRACTION  1987   Social History   Socioeconomic History  . Marital status: Married    Spouse name: Not on file  . Number of children: Not on file  . Years of education: Not on file  . Highest education level: Not on file  Occupational History  . Not on file  Social Needs  . Financial resource strain: Not on file  . Food insecurity:    Worry: Not on file    Inability: Not on file  . Transportation needs:    Medical: Not on  file    Non-medical: Not on file  Tobacco Use  . Smoking status: Never Smoker  . Smokeless tobacco: Never Used  Substance and Sexual Activity  . Alcohol use: Yes    Comment: 11/28/2015 "used to drink; stopped ~ 2 yr ago"  . Drug use: No  . Sexual activity: Yes  Lifestyle  . Physical activity:    Days per week: Not on file    Minutes per session: Not on file  . Stress: Not on file  Relationships  . Social connections:    Talks on phone: Not on file    Gets together: Not on file    Attends religious service: Not on file    Active member of club or organization: Not on file    Attends meetings of clubs or organizations: Not on file    Relationship status: Not on file  Other Topics Concern  . Not on file  Social History Narrative  . Not on file   Outpatient Encounter Medications as of 06/09/2018  Medication Sig  . acetaminophen (TYLENOL) 500 MG tablet Take 1,000 mg by mouth every 6 (six) hours as needed (for pain).  Marland Kitchen aspirin EC 325 MG EC tablet Take 1 tablet (325 mg total) by mouth daily.  . calcitRIOL (ROCALTROL) 0.25 MCG capsule Take 1 capsule (0.25 mcg total) by mouth every Monday, Wednesday, and Friday with hemodialysis. (Patient taking differently: Take 0.25 mcg by mouth See admin instructions. Take 0.25 mcg by mouth once a day on Mon/Tues/Thurs/Fri with home dialysis)  . carvedilol (COREG) 3.125 MG tablet Take 1 tablet (3.125 mg total) by mouth 2 (two) times daily with a meal. (Patient taking differently: Take 6.25 mg by mouth 2 (two) times daily with a meal. )  . cloNIDine (CATAPRES) 0.1 MG tablet Take 0.1 mg by mouth daily.   . Continuous Blood Gluc Sensor (FREESTYLE LIBRE 14 DAY SENSOR) MISC 1 each by Does not apply route every 14 (fourteen) days.  . Continuous Blood Gluc Sensor (FREESTYLE LIBRE SENSOR SYSTEM) MISC APPLY 1 SENSOR TO THE SKIN EVERY 10 DAYS  . docusate sodium (COLACE) 100 MG capsule Take 1 capsule (100 mg total) by mouth 2 (two) times daily. (Patient taking  differently: Take 200 mg by mouth daily as needed (when taking pain meds). )  . glucose blood (ACCU-CHEK GUIDE) test strip Use as instructed 4 x daily. E11.65  . Insulin Glargine (LANTUS SOLOSTAR) 100 UNIT/ML Solostar Pen Inject 34 Units into the skin daily.  . insulin lispro (HUMALOG) 100 UNIT/ML KwikPen Inject 0.15-0.21 mLs (15-21 Units total) into the skin 3 (three) times daily before meals.  Marland Kitchen lisinopril (PRINIVIL,ZESTRIL)  40 MG tablet Take 40 mg by mouth daily.   . ondansetron (ZOFRAN ODT) 4 MG disintegrating tablet Take 1 tablet (4 mg total) by mouth every 8 (eight) hours as needed for nausea or vomiting.  Marland Kitchen oseltamivir (TAMIFLU) 75 MG capsule Take 1 capsule (75 mg total) by mouth 2 (two) times daily.  . pantoprazole (PROTONIX) 40 MG tablet TAKE 1 TABLET BY MOUTH EVERY DAY (Patient taking differently: Take 40 mg by mouth daily. )  . polyethylene glycol (MIRALAX) packet Take 17 g by mouth daily. (Patient taking differently: Take 17 g by mouth See admin instructions. Take 17 grams by mouth, mixed, once a day only when taking pain meds)  . psyllium (HYDROCIL/METAMUCIL) 95 % PACK Take 1 packet by mouth daily.  . sucroferric oxyhydroxide (VELPHORO) 500 MG chewable tablet Chew 3 tablets (1,500 mg total) by mouth 3 (three) times daily.   No facility-administered encounter medications on file as of 06/09/2018.     ALLERGIES: Allergies  Allergen Reactions  . Atorvastatin Other (See Comments)    Muscle pain   . Minoxidil Other (See Comments)    Fluid retention   . Adhesive [Tape] Other (See Comments)    MUST USE "SILK TAPE," AS ANY OTHER "BURNS" THE SKIN  . Statins Other (See Comments)    Muscle pain    VACCINATION STATUS: Immunization History  Administered Date(s) Administered  . Influenza,inj,Quad PF,6+ Mos 05/06/2014  . Pneumococcal Polysaccharide-23 05/06/2014    Diabetes  He presents for his follow-up diabetic visit. He has type 1 diabetes mellitus. Onset time: He was diagnosed  at approximate age of 42 years with type 1 diabetes. His disease course has been improving (Since his last visit, he was hospitalized for cardiac arrest which reportedly happened because of hyperkalemia from defective dialysis machine.). There are no hypoglycemic associated symptoms. Pertinent negatives for hypoglycemia include no confusion, headaches, pallor or seizures. Pertinent negatives for diabetes include no chest pain, no fatigue, no polydipsia, no polyphagia, no polyuria and no weakness. There are no hypoglycemic complications. Symptoms are improving. Diabetic complications include nephropathy and PVD. (His diabetes is complicated by end-stage renal disease on  hemodialysis for the last 5 years.  Since last visit, he underwent right below-knee amputation due to peripheral arterial disease.  Patient is  on  Kidney transplant list.) Risk factors for coronary artery disease include diabetes mellitus, hypertension and sedentary lifestyle. Current diabetic treatment includes insulin injections. Weight trend: Status post right BKA. He is following a generally unhealthy diet. He has had a previous visit with a dietitian. He never participates in exercise. His home blood glucose trend is decreasing steadily. His breakfast blood glucose range is generally >200 mg/dl. His lunch blood glucose range is generally 180-200 mg/dl. His dinner blood glucose range is generally 180-200 mg/dl. His bedtime blood glucose range is generally >200 mg/dl. His overall blood glucose range is >200 mg/dl. (He reports fasting blood glucose ranging between 71-156, prelunch blood glucose readings ranging from 86-200, presupper blood glucose readings range from 182 220, bedtime readings ranging from 100-360.  His recent labs show A1c of 8.2% improving from 10.7%. ) An ACE inhibitor/angiotensin II receptor blocker is being taken. Eye exam is current.  Hypertension  This is a chronic problem. The current episode started more than 1 year  ago. The problem is controlled. Pertinent negatives include no chest pain, headaches, neck pain, palpitations or shortness of breath. Risk factors for coronary artery disease include diabetes mellitus and sedentary lifestyle. Past treatments include ACE  inhibitors. Hypertensive end-organ damage includes kidney disease and PVD.   Review of system: Limited as above.    Objective:    There were no vitals taken for this visit.  Wt Readings from Last 3 Encounters:  03/03/18 231 lb (104.8 kg)  02/19/18 231 lb (104.8 kg)  02/16/18 231 lb (104.8 kg)    CMP ( most recent) CMP     Component Value Date/Time   NA 135 06/03/2018 1128   K 5.1 06/03/2018 1128   CL 92 (L) 06/03/2018 1128   CO2 21 06/03/2018 1128   GLUCOSE 339 (H) 06/03/2018 1128   GLUCOSE 170 (H) 09/30/2017 0510   BUN 35 (H) 06/03/2018 1128   CREATININE 9.04 (H) 06/03/2018 1128   CREATININE 2.21 (H) 03/26/2013 1520   CALCIUM 9.5 06/03/2018 1128   PROT 8.0 06/03/2018 1128   ALBUMIN 4.4 06/03/2018 1128   AST 10 06/03/2018 1128   ALT 12 06/03/2018 1128   ALKPHOS 128 (H) 06/03/2018 1128   BILITOT 0.4 06/03/2018 1128   GFRNONAA 6 (L) 06/03/2018 1128   GFRAA 7 (L) 06/03/2018 1128     Diabetic Labs (most recent): Lab Results  Component Value Date   HGBA1C 8.2 (H) 06/03/2018   HGBA1C 10.7 (H) 01/19/2018   HGBA1C 6.7 (H) 07/25/2017     Lipid Panel ( most recent) Lipid Panel     Component Value Date/Time   CHOL 220 (H) 06/03/2018 1128   TRIG 134 06/03/2018 1128   HDL 53 06/03/2018 1128   CHOLHDL 4.2 06/03/2018 1128   CHOLHDL 4.5 12/28/2013 0707   VLDL 46 (H) 12/28/2013 0707   LDLCALC 140 (H) 06/03/2018 1128      Lab Results  Component Value Date   TSH 0.697 01/19/2018   TSH 1.390 02/18/2017   TSH 1.760 12/26/2013   FREET4 0.98 01/19/2018   FREET4 1.00 02/18/2017     Assessment & Plan:   1. Type 1 diabetes mellitus with end-stage renal disease ([place])  - Lance [name] has currently uncontrolled  symptomatic type 1 DM since  48 years of age.  -His diabetes is complicated by peripheral arterial disease status post right BKA, ESRD currently on hemodialysis, recent hospitalization for cardiac arrest due to hyperkalemia which resulted from defective dialysis machine.   -He reports fluctuating blood glucose profile, however overall improving.  His previsit A1c is 8.2% improving from 10.7%.     Lance [name] remains at a high risk for more acute and chronic complications which include CAD, CVA, retinopathy, and neuropathy. These are all discussed in detail with the patient.  - I have counseled him on diet management  by adopting a carbohydrate restricted/protein rich diet.  - Patient admits there is a room for improvement in his diet and drink choices. -  Suggestion is made for him to avoid simple carbohydrates  from his diet including Cakes, Sweet Desserts / Pastries, Ice Cream, Soda (diet and regular), Sweet Tea, Candies, Chips, Cookies, Store Bought Juices, Alcohol in Excess of  1-2 drinks a day, Artificial Sweeteners, and "Sugar-free" Products. This will help patient to have stable blood glucose profile and potentially avoid unintended weight gain.   - I encouraged him to switch to  unprocessed or minimally processed complex starch and increased protein intake (animal or plant source), fruits, and vegetables.  - he is advised to stick to a routine mealtimes to eat 3 meals  a day and avoid unnecessary snacks ( to snack only to correct hypoglycemia).   -  he has been scheduled with Jearld Fenton, RDN, CDE for individualized diabetes education- consult pending.  - I have approached him with the following individualized plan to manage diabetes and patient agrees:    He is approached to stay engaged,  use of his CGM device at all times.  He will continue to require intensive treatment with basal/bolus insulin.     -He is advised to continue Lantus 34 units nightly, continue Humalog  15 units 3 times a day with meals for pre-meal BG readings of 70-150mg /dl, plus patient specific correction dose for unexpected hyperglycemia above 150mg /dl, associated with strict documenting of  glucose 4 times a day-before meals and at bedtime. - Patient is warned not to take insulin without proper monitoring per orders. -Adjustment parameters are given for hypo and hyperglycemia in writing. -Patient is encouraged to call clinic for blood glucose levels less than 70 or above 300 mg /dl.  - Insulin is the only choice of therapy for him. He is benefiting from CGM, I advised him to wear it at all times.   - Patient specific target  A1c;  LDL, HDL, Triglycerides, and  Waist Circumference were discussed in detail.  2) BP/HTN: he is advised to home monitor blood pressure and report if > 140/90 on 2 separate readings.  He is advised to continue his current blood pressure medications including Coreg 3.125 mg p.o. twice daily, lisinopril 40 mg p.o. Daily.   3) Lipids/HPL: Recent  Lipid panel   shows LDL at 89. He  is marked as allergic to statins.   4)  Weight/Diet: CDE Consult has been  initiated , exercise, and detailed carbohydrates information provided.  5) Chronic Care/Health Maintenance:  -he  is on ACEI medications and  is encouraged to continue to follow up with Ophthalmology, Dentist, nephrology,  Podiatrist at least yearly or according to recommendations, and advised to  stay away from smoking. I have recommended yearly flu vaccine and pneumonia vaccination at least every 5 years; moderate intensity exercise for up to 150 minutes weekly; and  sleep for at least 7 hours a day.  - I advised patient to maintain close follow up with [name], MD for primary care needs.  - Patient Care Time Today:  25 min, of which >50% was spent in reviewing his  current and  previous labs/studies, previous treatments, and medications doses and developing a plan for long-term care based on the latest  recommendations for standards of care.  Lance [name] participated in the discussions, expressed understanding, and voiced agreement with the above plans.  All questions were answered to his satisfaction. he is encouraged to contact clinic should he have any questions or concerns prior to his return visit.   Follow up plan: - Return in about 4 months (around 10/09/2018) for Follow up with Pre-visit Labs, Meter, and Logs.  Glade Lloyd, MD [facility] [address] [place], [place] [address] Phone: [phone]  Fax: [phone]    06/09/2018, 9:08 PM  This note was partially dictated with voice recognition software. Similar sounding words can be transcribed inadequately or may not  be corrected upon review.

## 2018-06-11 DIAGNOSIS — Z992 Dependence on renal dialysis: Secondary | ICD-10-CM | POA: Diagnosis not present

## 2018-06-11 DIAGNOSIS — R509 Fever, unspecified: Secondary | ICD-10-CM | POA: Diagnosis not present

## 2018-06-11 DIAGNOSIS — N186 End stage renal disease: Secondary | ICD-10-CM | POA: Diagnosis not present

## 2018-06-11 DIAGNOSIS — K7689 Other specified diseases of liver: Secondary | ICD-10-CM | POA: Diagnosis not present

## 2018-06-11 DIAGNOSIS — D631 Anemia in chronic kidney disease: Secondary | ICD-10-CM | POA: Diagnosis not present

## 2018-06-11 DIAGNOSIS — N2581 Secondary hyperparathyroidism of renal origin: Secondary | ICD-10-CM | POA: Diagnosis not present

## 2018-06-11 DIAGNOSIS — Z4931 Encounter for adequacy testing for hemodialysis: Secondary | ICD-10-CM | POA: Diagnosis not present

## 2018-06-11 DIAGNOSIS — E1022 Type 1 diabetes mellitus with diabetic chronic kidney disease: Secondary | ICD-10-CM | POA: Diagnosis not present

## 2018-06-12 DIAGNOSIS — E440 Moderate protein-calorie malnutrition: Secondary | ICD-10-CM | POA: Diagnosis not present

## 2018-06-12 DIAGNOSIS — Z992 Dependence on renal dialysis: Secondary | ICD-10-CM | POA: Diagnosis not present

## 2018-06-12 DIAGNOSIS — N186 End stage renal disease: Secondary | ICD-10-CM | POA: Diagnosis not present

## 2018-06-12 DIAGNOSIS — E1022 Type 1 diabetes mellitus with diabetic chronic kidney disease: Secondary | ICD-10-CM | POA: Diagnosis not present

## 2018-06-12 DIAGNOSIS — D631 Anemia in chronic kidney disease: Secondary | ICD-10-CM | POA: Diagnosis not present

## 2018-06-12 DIAGNOSIS — Z4931 Encounter for adequacy testing for hemodialysis: Secondary | ICD-10-CM | POA: Diagnosis not present

## 2018-06-12 DIAGNOSIS — I120 Hypertensive chronic kidney disease with stage 5 chronic kidney disease or end stage renal disease: Secondary | ICD-10-CM | POA: Diagnosis not present

## 2018-06-12 DIAGNOSIS — N2581 Secondary hyperparathyroidism of renal origin: Secondary | ICD-10-CM | POA: Diagnosis not present

## 2018-06-12 DIAGNOSIS — R17 Unspecified jaundice: Secondary | ICD-10-CM | POA: Diagnosis not present

## 2018-06-12 DIAGNOSIS — K7689 Other specified diseases of liver: Secondary | ICD-10-CM | POA: Diagnosis not present

## 2018-06-12 DIAGNOSIS — E8779 Other fluid overload: Secondary | ICD-10-CM | POA: Diagnosis not present

## 2018-06-12 DIAGNOSIS — Z79899 Other long term (current) drug therapy: Secondary | ICD-10-CM | POA: Diagnosis not present

## 2018-06-12 DIAGNOSIS — R509 Fever, unspecified: Secondary | ICD-10-CM | POA: Diagnosis not present

## 2018-06-15 DIAGNOSIS — N2581 Secondary hyperparathyroidism of renal origin: Secondary | ICD-10-CM | POA: Diagnosis not present

## 2018-06-15 DIAGNOSIS — Z4931 Encounter for adequacy testing for hemodialysis: Secondary | ICD-10-CM | POA: Diagnosis not present

## 2018-06-15 DIAGNOSIS — D631 Anemia in chronic kidney disease: Secondary | ICD-10-CM | POA: Diagnosis not present

## 2018-06-15 DIAGNOSIS — N186 End stage renal disease: Secondary | ICD-10-CM | POA: Diagnosis not present

## 2018-06-15 DIAGNOSIS — R17 Unspecified jaundice: Secondary | ICD-10-CM | POA: Diagnosis not present

## 2018-06-15 DIAGNOSIS — K7689 Other specified diseases of liver: Secondary | ICD-10-CM | POA: Diagnosis not present

## 2018-06-16 DIAGNOSIS — R17 Unspecified jaundice: Secondary | ICD-10-CM | POA: Diagnosis not present

## 2018-06-16 DIAGNOSIS — N186 End stage renal disease: Secondary | ICD-10-CM | POA: Diagnosis not present

## 2018-06-16 DIAGNOSIS — Z4931 Encounter for adequacy testing for hemodialysis: Secondary | ICD-10-CM | POA: Diagnosis not present

## 2018-06-16 DIAGNOSIS — K7689 Other specified diseases of liver: Secondary | ICD-10-CM | POA: Diagnosis not present

## 2018-06-16 DIAGNOSIS — D631 Anemia in chronic kidney disease: Secondary | ICD-10-CM | POA: Diagnosis not present

## 2018-06-16 DIAGNOSIS — N2581 Secondary hyperparathyroidism of renal origin: Secondary | ICD-10-CM | POA: Diagnosis not present

## 2018-06-18 DIAGNOSIS — N186 End stage renal disease: Secondary | ICD-10-CM | POA: Diagnosis not present

## 2018-06-18 DIAGNOSIS — D631 Anemia in chronic kidney disease: Secondary | ICD-10-CM | POA: Diagnosis not present

## 2018-06-18 DIAGNOSIS — K7689 Other specified diseases of liver: Secondary | ICD-10-CM | POA: Diagnosis not present

## 2018-06-18 DIAGNOSIS — N2581 Secondary hyperparathyroidism of renal origin: Secondary | ICD-10-CM | POA: Diagnosis not present

## 2018-06-18 DIAGNOSIS — R17 Unspecified jaundice: Secondary | ICD-10-CM | POA: Diagnosis not present

## 2018-06-18 DIAGNOSIS — Z4931 Encounter for adequacy testing for hemodialysis: Secondary | ICD-10-CM | POA: Diagnosis not present

## 2018-06-19 DIAGNOSIS — N186 End stage renal disease: Secondary | ICD-10-CM | POA: Diagnosis not present

## 2018-06-19 DIAGNOSIS — R17 Unspecified jaundice: Secondary | ICD-10-CM | POA: Diagnosis not present

## 2018-06-19 DIAGNOSIS — N2581 Secondary hyperparathyroidism of renal origin: Secondary | ICD-10-CM | POA: Diagnosis not present

## 2018-06-19 DIAGNOSIS — Z4931 Encounter for adequacy testing for hemodialysis: Secondary | ICD-10-CM | POA: Diagnosis not present

## 2018-06-19 DIAGNOSIS — K7689 Other specified diseases of liver: Secondary | ICD-10-CM | POA: Diagnosis not present

## 2018-06-19 DIAGNOSIS — D631 Anemia in chronic kidney disease: Secondary | ICD-10-CM | POA: Diagnosis not present

## 2018-06-22 DIAGNOSIS — D631 Anemia in chronic kidney disease: Secondary | ICD-10-CM | POA: Diagnosis not present

## 2018-06-22 DIAGNOSIS — Z4931 Encounter for adequacy testing for hemodialysis: Secondary | ICD-10-CM | POA: Diagnosis not present

## 2018-06-22 DIAGNOSIS — N186 End stage renal disease: Secondary | ICD-10-CM | POA: Diagnosis not present

## 2018-06-22 DIAGNOSIS — R17 Unspecified jaundice: Secondary | ICD-10-CM | POA: Diagnosis not present

## 2018-06-22 DIAGNOSIS — K7689 Other specified diseases of liver: Secondary | ICD-10-CM | POA: Diagnosis not present

## 2018-06-22 DIAGNOSIS — N2581 Secondary hyperparathyroidism of renal origin: Secondary | ICD-10-CM | POA: Diagnosis not present

## 2018-06-23 DIAGNOSIS — D631 Anemia in chronic kidney disease: Secondary | ICD-10-CM | POA: Diagnosis not present

## 2018-06-23 DIAGNOSIS — Z4931 Encounter for adequacy testing for hemodialysis: Secondary | ICD-10-CM | POA: Diagnosis not present

## 2018-06-23 DIAGNOSIS — N186 End stage renal disease: Secondary | ICD-10-CM | POA: Diagnosis not present

## 2018-06-23 DIAGNOSIS — K7689 Other specified diseases of liver: Secondary | ICD-10-CM | POA: Diagnosis not present

## 2018-06-23 DIAGNOSIS — N2581 Secondary hyperparathyroidism of renal origin: Secondary | ICD-10-CM | POA: Diagnosis not present

## 2018-06-23 DIAGNOSIS — R17 Unspecified jaundice: Secondary | ICD-10-CM | POA: Diagnosis not present

## 2018-06-24 DIAGNOSIS — Z992 Dependence on renal dialysis: Secondary | ICD-10-CM | POA: Diagnosis not present

## 2018-06-24 DIAGNOSIS — I871 Compression of vein: Secondary | ICD-10-CM | POA: Diagnosis not present

## 2018-06-24 DIAGNOSIS — N186 End stage renal disease: Secondary | ICD-10-CM | POA: Diagnosis not present

## 2018-06-25 DIAGNOSIS — Z4931 Encounter for adequacy testing for hemodialysis: Secondary | ICD-10-CM | POA: Diagnosis not present

## 2018-06-25 DIAGNOSIS — D631 Anemia in chronic kidney disease: Secondary | ICD-10-CM | POA: Diagnosis not present

## 2018-06-25 DIAGNOSIS — N186 End stage renal disease: Secondary | ICD-10-CM | POA: Diagnosis not present

## 2018-06-25 DIAGNOSIS — N2581 Secondary hyperparathyroidism of renal origin: Secondary | ICD-10-CM | POA: Diagnosis not present

## 2018-06-25 DIAGNOSIS — K7689 Other specified diseases of liver: Secondary | ICD-10-CM | POA: Diagnosis not present

## 2018-06-25 DIAGNOSIS — R17 Unspecified jaundice: Secondary | ICD-10-CM | POA: Diagnosis not present

## 2018-06-26 DIAGNOSIS — Z4931 Encounter for adequacy testing for hemodialysis: Secondary | ICD-10-CM | POA: Diagnosis not present

## 2018-06-26 DIAGNOSIS — N2581 Secondary hyperparathyroidism of renal origin: Secondary | ICD-10-CM | POA: Diagnosis not present

## 2018-06-26 DIAGNOSIS — N186 End stage renal disease: Secondary | ICD-10-CM | POA: Diagnosis not present

## 2018-06-26 DIAGNOSIS — K7689 Other specified diseases of liver: Secondary | ICD-10-CM | POA: Diagnosis not present

## 2018-06-26 DIAGNOSIS — D631 Anemia in chronic kidney disease: Secondary | ICD-10-CM | POA: Diagnosis not present

## 2018-06-26 DIAGNOSIS — R17 Unspecified jaundice: Secondary | ICD-10-CM | POA: Diagnosis not present

## 2018-06-29 DIAGNOSIS — K7689 Other specified diseases of liver: Secondary | ICD-10-CM | POA: Diagnosis not present

## 2018-06-29 DIAGNOSIS — N2581 Secondary hyperparathyroidism of renal origin: Secondary | ICD-10-CM | POA: Diagnosis not present

## 2018-06-29 DIAGNOSIS — N186 End stage renal disease: Secondary | ICD-10-CM | POA: Diagnosis not present

## 2018-06-29 DIAGNOSIS — R17 Unspecified jaundice: Secondary | ICD-10-CM | POA: Diagnosis not present

## 2018-06-29 DIAGNOSIS — D631 Anemia in chronic kidney disease: Secondary | ICD-10-CM | POA: Diagnosis not present

## 2018-06-29 DIAGNOSIS — Z4931 Encounter for adequacy testing for hemodialysis: Secondary | ICD-10-CM | POA: Diagnosis not present

## 2018-06-30 DIAGNOSIS — K7689 Other specified diseases of liver: Secondary | ICD-10-CM | POA: Diagnosis not present

## 2018-06-30 DIAGNOSIS — D631 Anemia in chronic kidney disease: Secondary | ICD-10-CM | POA: Diagnosis not present

## 2018-06-30 DIAGNOSIS — N186 End stage renal disease: Secondary | ICD-10-CM | POA: Diagnosis not present

## 2018-06-30 DIAGNOSIS — Z4931 Encounter for adequacy testing for hemodialysis: Secondary | ICD-10-CM | POA: Diagnosis not present

## 2018-06-30 DIAGNOSIS — R17 Unspecified jaundice: Secondary | ICD-10-CM | POA: Diagnosis not present

## 2018-06-30 DIAGNOSIS — N2581 Secondary hyperparathyroidism of renal origin: Secondary | ICD-10-CM | POA: Diagnosis not present

## 2018-07-02 DIAGNOSIS — N2581 Secondary hyperparathyroidism of renal origin: Secondary | ICD-10-CM | POA: Diagnosis not present

## 2018-07-02 DIAGNOSIS — N186 End stage renal disease: Secondary | ICD-10-CM | POA: Diagnosis not present

## 2018-07-02 DIAGNOSIS — R17 Unspecified jaundice: Secondary | ICD-10-CM | POA: Diagnosis not present

## 2018-07-02 DIAGNOSIS — D631 Anemia in chronic kidney disease: Secondary | ICD-10-CM | POA: Diagnosis not present

## 2018-07-02 DIAGNOSIS — Z4931 Encounter for adequacy testing for hemodialysis: Secondary | ICD-10-CM | POA: Diagnosis not present

## 2018-07-02 DIAGNOSIS — K7689 Other specified diseases of liver: Secondary | ICD-10-CM | POA: Diagnosis not present

## 2018-07-03 DIAGNOSIS — R17 Unspecified jaundice: Secondary | ICD-10-CM | POA: Diagnosis not present

## 2018-07-03 DIAGNOSIS — Z4931 Encounter for adequacy testing for hemodialysis: Secondary | ICD-10-CM | POA: Diagnosis not present

## 2018-07-03 DIAGNOSIS — K7689 Other specified diseases of liver: Secondary | ICD-10-CM | POA: Diagnosis not present

## 2018-07-03 DIAGNOSIS — N186 End stage renal disease: Secondary | ICD-10-CM | POA: Diagnosis not present

## 2018-07-03 DIAGNOSIS — N2581 Secondary hyperparathyroidism of renal origin: Secondary | ICD-10-CM | POA: Diagnosis not present

## 2018-07-03 DIAGNOSIS — D631 Anemia in chronic kidney disease: Secondary | ICD-10-CM | POA: Diagnosis not present

## 2018-07-06 DIAGNOSIS — N186 End stage renal disease: Secondary | ICD-10-CM | POA: Diagnosis not present

## 2018-07-06 DIAGNOSIS — Z4931 Encounter for adequacy testing for hemodialysis: Secondary | ICD-10-CM | POA: Diagnosis not present

## 2018-07-06 DIAGNOSIS — D631 Anemia in chronic kidney disease: Secondary | ICD-10-CM | POA: Diagnosis not present

## 2018-07-06 DIAGNOSIS — R17 Unspecified jaundice: Secondary | ICD-10-CM | POA: Diagnosis not present

## 2018-07-06 DIAGNOSIS — K7689 Other specified diseases of liver: Secondary | ICD-10-CM | POA: Diagnosis not present

## 2018-07-06 DIAGNOSIS — N2581 Secondary hyperparathyroidism of renal origin: Secondary | ICD-10-CM | POA: Diagnosis not present

## 2018-07-07 DIAGNOSIS — N186 End stage renal disease: Secondary | ICD-10-CM | POA: Diagnosis not present

## 2018-07-07 DIAGNOSIS — R17 Unspecified jaundice: Secondary | ICD-10-CM | POA: Diagnosis not present

## 2018-07-07 DIAGNOSIS — K7689 Other specified diseases of liver: Secondary | ICD-10-CM | POA: Diagnosis not present

## 2018-07-07 DIAGNOSIS — Z4931 Encounter for adequacy testing for hemodialysis: Secondary | ICD-10-CM | POA: Diagnosis not present

## 2018-07-07 DIAGNOSIS — D631 Anemia in chronic kidney disease: Secondary | ICD-10-CM | POA: Diagnosis not present

## 2018-07-07 DIAGNOSIS — N2581 Secondary hyperparathyroidism of renal origin: Secondary | ICD-10-CM | POA: Diagnosis not present

## 2018-07-09 DIAGNOSIS — Z4931 Encounter for adequacy testing for hemodialysis: Secondary | ICD-10-CM | POA: Diagnosis not present

## 2018-07-09 DIAGNOSIS — D631 Anemia in chronic kidney disease: Secondary | ICD-10-CM | POA: Diagnosis not present

## 2018-07-09 DIAGNOSIS — N186 End stage renal disease: Secondary | ICD-10-CM | POA: Diagnosis not present

## 2018-07-09 DIAGNOSIS — Z992 Dependence on renal dialysis: Secondary | ICD-10-CM | POA: Diagnosis not present

## 2018-07-09 DIAGNOSIS — R17 Unspecified jaundice: Secondary | ICD-10-CM | POA: Diagnosis not present

## 2018-07-09 DIAGNOSIS — N2581 Secondary hyperparathyroidism of renal origin: Secondary | ICD-10-CM | POA: Diagnosis not present

## 2018-07-09 DIAGNOSIS — K7689 Other specified diseases of liver: Secondary | ICD-10-CM | POA: Diagnosis not present

## 2018-07-09 DIAGNOSIS — I871 Compression of vein: Secondary | ICD-10-CM | POA: Diagnosis not present

## 2018-07-10 DIAGNOSIS — N2581 Secondary hyperparathyroidism of renal origin: Secondary | ICD-10-CM | POA: Diagnosis not present

## 2018-07-10 DIAGNOSIS — N186 End stage renal disease: Secondary | ICD-10-CM | POA: Diagnosis not present

## 2018-07-10 DIAGNOSIS — R17 Unspecified jaundice: Secondary | ICD-10-CM | POA: Diagnosis not present

## 2018-07-10 DIAGNOSIS — D631 Anemia in chronic kidney disease: Secondary | ICD-10-CM | POA: Diagnosis not present

## 2018-07-10 DIAGNOSIS — K7689 Other specified diseases of liver: Secondary | ICD-10-CM | POA: Diagnosis not present

## 2018-07-10 DIAGNOSIS — Z4931 Encounter for adequacy testing for hemodialysis: Secondary | ICD-10-CM | POA: Diagnosis not present

## 2018-07-13 DIAGNOSIS — N2581 Secondary hyperparathyroidism of renal origin: Secondary | ICD-10-CM | POA: Diagnosis not present

## 2018-07-13 DIAGNOSIS — I120 Hypertensive chronic kidney disease with stage 5 chronic kidney disease or end stage renal disease: Secondary | ICD-10-CM | POA: Diagnosis not present

## 2018-07-13 DIAGNOSIS — E8779 Other fluid overload: Secondary | ICD-10-CM | POA: Diagnosis not present

## 2018-07-13 DIAGNOSIS — E440 Moderate protein-calorie malnutrition: Secondary | ICD-10-CM | POA: Diagnosis not present

## 2018-07-13 DIAGNOSIS — R17 Unspecified jaundice: Secondary | ICD-10-CM | POA: Diagnosis not present

## 2018-07-13 DIAGNOSIS — R509 Fever, unspecified: Secondary | ICD-10-CM | POA: Diagnosis not present

## 2018-07-13 DIAGNOSIS — K7689 Other specified diseases of liver: Secondary | ICD-10-CM | POA: Diagnosis not present

## 2018-07-13 DIAGNOSIS — D631 Anemia in chronic kidney disease: Secondary | ICD-10-CM | POA: Diagnosis not present

## 2018-07-13 DIAGNOSIS — Z992 Dependence on renal dialysis: Secondary | ICD-10-CM | POA: Diagnosis not present

## 2018-07-13 DIAGNOSIS — Z79899 Other long term (current) drug therapy: Secondary | ICD-10-CM | POA: Diagnosis not present

## 2018-07-13 DIAGNOSIS — Z4931 Encounter for adequacy testing for hemodialysis: Secondary | ICD-10-CM | POA: Diagnosis not present

## 2018-07-13 DIAGNOSIS — E1022 Type 1 diabetes mellitus with diabetic chronic kidney disease: Secondary | ICD-10-CM | POA: Diagnosis not present

## 2018-07-13 DIAGNOSIS — N186 End stage renal disease: Secondary | ICD-10-CM | POA: Diagnosis not present

## 2018-07-14 DIAGNOSIS — Z4931 Encounter for adequacy testing for hemodialysis: Secondary | ICD-10-CM | POA: Diagnosis not present

## 2018-07-14 DIAGNOSIS — R509 Fever, unspecified: Secondary | ICD-10-CM | POA: Diagnosis not present

## 2018-07-14 DIAGNOSIS — N186 End stage renal disease: Secondary | ICD-10-CM | POA: Diagnosis not present

## 2018-07-14 DIAGNOSIS — R17 Unspecified jaundice: Secondary | ICD-10-CM | POA: Diagnosis not present

## 2018-07-14 DIAGNOSIS — D631 Anemia in chronic kidney disease: Secondary | ICD-10-CM | POA: Diagnosis not present

## 2018-07-14 DIAGNOSIS — Z79899 Other long term (current) drug therapy: Secondary | ICD-10-CM | POA: Diagnosis not present

## 2018-07-16 DIAGNOSIS — R509 Fever, unspecified: Secondary | ICD-10-CM | POA: Diagnosis not present

## 2018-07-16 DIAGNOSIS — D631 Anemia in chronic kidney disease: Secondary | ICD-10-CM | POA: Diagnosis not present

## 2018-07-16 DIAGNOSIS — Z79899 Other long term (current) drug therapy: Secondary | ICD-10-CM | POA: Diagnosis not present

## 2018-07-16 DIAGNOSIS — R17 Unspecified jaundice: Secondary | ICD-10-CM | POA: Diagnosis not present

## 2018-07-16 DIAGNOSIS — N186 End stage renal disease: Secondary | ICD-10-CM | POA: Diagnosis not present

## 2018-07-16 DIAGNOSIS — Z4931 Encounter for adequacy testing for hemodialysis: Secondary | ICD-10-CM | POA: Diagnosis not present

## 2018-07-17 DIAGNOSIS — R17 Unspecified jaundice: Secondary | ICD-10-CM | POA: Diagnosis not present

## 2018-07-17 DIAGNOSIS — Z79899 Other long term (current) drug therapy: Secondary | ICD-10-CM | POA: Diagnosis not present

## 2018-07-17 DIAGNOSIS — Z4931 Encounter for adequacy testing for hemodialysis: Secondary | ICD-10-CM | POA: Diagnosis not present

## 2018-07-17 DIAGNOSIS — N186 End stage renal disease: Secondary | ICD-10-CM | POA: Diagnosis not present

## 2018-07-17 DIAGNOSIS — R509 Fever, unspecified: Secondary | ICD-10-CM | POA: Diagnosis not present

## 2018-07-17 DIAGNOSIS — D631 Anemia in chronic kidney disease: Secondary | ICD-10-CM | POA: Diagnosis not present

## 2018-07-20 DIAGNOSIS — Z4931 Encounter for adequacy testing for hemodialysis: Secondary | ICD-10-CM | POA: Diagnosis not present

## 2018-07-20 DIAGNOSIS — Z79899 Other long term (current) drug therapy: Secondary | ICD-10-CM | POA: Diagnosis not present

## 2018-07-20 DIAGNOSIS — D631 Anemia in chronic kidney disease: Secondary | ICD-10-CM | POA: Diagnosis not present

## 2018-07-20 DIAGNOSIS — R509 Fever, unspecified: Secondary | ICD-10-CM | POA: Diagnosis not present

## 2018-07-20 DIAGNOSIS — N186 End stage renal disease: Secondary | ICD-10-CM | POA: Diagnosis not present

## 2018-07-20 DIAGNOSIS — R17 Unspecified jaundice: Secondary | ICD-10-CM | POA: Diagnosis not present

## 2018-07-21 DIAGNOSIS — R509 Fever, unspecified: Secondary | ICD-10-CM | POA: Diagnosis not present

## 2018-07-21 DIAGNOSIS — R17 Unspecified jaundice: Secondary | ICD-10-CM | POA: Diagnosis not present

## 2018-07-21 DIAGNOSIS — Z4931 Encounter for adequacy testing for hemodialysis: Secondary | ICD-10-CM | POA: Diagnosis not present

## 2018-07-21 DIAGNOSIS — N186 End stage renal disease: Secondary | ICD-10-CM | POA: Diagnosis not present

## 2018-07-21 DIAGNOSIS — D631 Anemia in chronic kidney disease: Secondary | ICD-10-CM | POA: Diagnosis not present

## 2018-07-21 DIAGNOSIS — Z79899 Other long term (current) drug therapy: Secondary | ICD-10-CM | POA: Diagnosis not present

## 2018-07-23 ENCOUNTER — Other Ambulatory Visit: Payer: Self-pay | Admitting: "Endocrinology

## 2018-07-23 DIAGNOSIS — Z79899 Other long term (current) drug therapy: Secondary | ICD-10-CM | POA: Diagnosis not present

## 2018-07-23 DIAGNOSIS — D631 Anemia in chronic kidney disease: Secondary | ICD-10-CM | POA: Diagnosis not present

## 2018-07-23 DIAGNOSIS — R509 Fever, unspecified: Secondary | ICD-10-CM | POA: Diagnosis not present

## 2018-07-23 DIAGNOSIS — Z4931 Encounter for adequacy testing for hemodialysis: Secondary | ICD-10-CM | POA: Diagnosis not present

## 2018-07-23 DIAGNOSIS — N186 End stage renal disease: Secondary | ICD-10-CM | POA: Diagnosis not present

## 2018-07-23 DIAGNOSIS — R17 Unspecified jaundice: Secondary | ICD-10-CM | POA: Diagnosis not present

## 2018-07-24 DIAGNOSIS — D631 Anemia in chronic kidney disease: Secondary | ICD-10-CM | POA: Diagnosis not present

## 2018-07-24 DIAGNOSIS — Z4931 Encounter for adequacy testing for hemodialysis: Secondary | ICD-10-CM | POA: Diagnosis not present

## 2018-07-24 DIAGNOSIS — R509 Fever, unspecified: Secondary | ICD-10-CM | POA: Diagnosis not present

## 2018-07-24 DIAGNOSIS — Z79899 Other long term (current) drug therapy: Secondary | ICD-10-CM | POA: Diagnosis not present

## 2018-07-24 DIAGNOSIS — N186 End stage renal disease: Secondary | ICD-10-CM | POA: Diagnosis not present

## 2018-07-24 DIAGNOSIS — R17 Unspecified jaundice: Secondary | ICD-10-CM | POA: Diagnosis not present

## 2018-07-27 ENCOUNTER — Telehealth: Payer: Self-pay | Admitting: "Endocrinology

## 2018-07-27 DIAGNOSIS — Z4931 Encounter for adequacy testing for hemodialysis: Secondary | ICD-10-CM | POA: Diagnosis not present

## 2018-07-27 DIAGNOSIS — Z79899 Other long term (current) drug therapy: Secondary | ICD-10-CM | POA: Diagnosis not present

## 2018-07-27 DIAGNOSIS — D631 Anemia in chronic kidney disease: Secondary | ICD-10-CM | POA: Diagnosis not present

## 2018-07-27 DIAGNOSIS — N186 End stage renal disease: Secondary | ICD-10-CM | POA: Diagnosis not present

## 2018-07-27 DIAGNOSIS — R17 Unspecified jaundice: Secondary | ICD-10-CM | POA: Diagnosis not present

## 2018-07-27 DIAGNOSIS — R509 Fever, unspecified: Secondary | ICD-10-CM | POA: Diagnosis not present

## 2018-07-27 NOTE — Telephone Encounter (Signed)
Patient asked for a 90 day supply for his insulin lispro (HUMALOG) 100 UNIT/ML KwikPen. He said it was sent in for 72 days and insurance will only pay for 90 days. He is out of this

## 2018-07-27 NOTE — Telephone Encounter (Signed)
done

## 2018-07-28 DIAGNOSIS — D631 Anemia in chronic kidney disease: Secondary | ICD-10-CM | POA: Diagnosis not present

## 2018-07-28 DIAGNOSIS — Z4931 Encounter for adequacy testing for hemodialysis: Secondary | ICD-10-CM | POA: Diagnosis not present

## 2018-07-28 DIAGNOSIS — Z79899 Other long term (current) drug therapy: Secondary | ICD-10-CM | POA: Diagnosis not present

## 2018-07-28 DIAGNOSIS — N186 End stage renal disease: Secondary | ICD-10-CM | POA: Diagnosis not present

## 2018-07-28 DIAGNOSIS — R17 Unspecified jaundice: Secondary | ICD-10-CM | POA: Diagnosis not present

## 2018-07-28 DIAGNOSIS — R509 Fever, unspecified: Secondary | ICD-10-CM | POA: Diagnosis not present

## 2018-07-30 DIAGNOSIS — D631 Anemia in chronic kidney disease: Secondary | ICD-10-CM | POA: Diagnosis not present

## 2018-07-30 DIAGNOSIS — R17 Unspecified jaundice: Secondary | ICD-10-CM | POA: Diagnosis not present

## 2018-07-30 DIAGNOSIS — Z4931 Encounter for adequacy testing for hemodialysis: Secondary | ICD-10-CM | POA: Diagnosis not present

## 2018-07-30 DIAGNOSIS — N186 End stage renal disease: Secondary | ICD-10-CM | POA: Diagnosis not present

## 2018-07-30 DIAGNOSIS — Z79899 Other long term (current) drug therapy: Secondary | ICD-10-CM | POA: Diagnosis not present

## 2018-07-30 DIAGNOSIS — R509 Fever, unspecified: Secondary | ICD-10-CM | POA: Diagnosis not present

## 2018-07-31 DIAGNOSIS — D631 Anemia in chronic kidney disease: Secondary | ICD-10-CM | POA: Diagnosis not present

## 2018-07-31 DIAGNOSIS — Z4931 Encounter for adequacy testing for hemodialysis: Secondary | ICD-10-CM | POA: Diagnosis not present

## 2018-07-31 DIAGNOSIS — R509 Fever, unspecified: Secondary | ICD-10-CM | POA: Diagnosis not present

## 2018-07-31 DIAGNOSIS — N186 End stage renal disease: Secondary | ICD-10-CM | POA: Diagnosis not present

## 2018-07-31 DIAGNOSIS — Z79899 Other long term (current) drug therapy: Secondary | ICD-10-CM | POA: Diagnosis not present

## 2018-07-31 DIAGNOSIS — R17 Unspecified jaundice: Secondary | ICD-10-CM | POA: Diagnosis not present

## 2018-08-03 DIAGNOSIS — Z79899 Other long term (current) drug therapy: Secondary | ICD-10-CM | POA: Diagnosis not present

## 2018-08-03 DIAGNOSIS — D631 Anemia in chronic kidney disease: Secondary | ICD-10-CM | POA: Diagnosis not present

## 2018-08-03 DIAGNOSIS — Z4931 Encounter for adequacy testing for hemodialysis: Secondary | ICD-10-CM | POA: Diagnosis not present

## 2018-08-03 DIAGNOSIS — R17 Unspecified jaundice: Secondary | ICD-10-CM | POA: Diagnosis not present

## 2018-08-03 DIAGNOSIS — R509 Fever, unspecified: Secondary | ICD-10-CM | POA: Diagnosis not present

## 2018-08-03 DIAGNOSIS — N186 End stage renal disease: Secondary | ICD-10-CM | POA: Diagnosis not present

## 2018-08-04 DIAGNOSIS — R509 Fever, unspecified: Secondary | ICD-10-CM | POA: Diagnosis not present

## 2018-08-04 DIAGNOSIS — D631 Anemia in chronic kidney disease: Secondary | ICD-10-CM | POA: Diagnosis not present

## 2018-08-04 DIAGNOSIS — Z4931 Encounter for adequacy testing for hemodialysis: Secondary | ICD-10-CM | POA: Diagnosis not present

## 2018-08-04 DIAGNOSIS — R17 Unspecified jaundice: Secondary | ICD-10-CM | POA: Diagnosis not present

## 2018-08-04 DIAGNOSIS — Z79899 Other long term (current) drug therapy: Secondary | ICD-10-CM | POA: Diagnosis not present

## 2018-08-04 DIAGNOSIS — N186 End stage renal disease: Secondary | ICD-10-CM | POA: Diagnosis not present

## 2018-08-06 DIAGNOSIS — Z4931 Encounter for adequacy testing for hemodialysis: Secondary | ICD-10-CM | POA: Diagnosis not present

## 2018-08-06 DIAGNOSIS — N186 End stage renal disease: Secondary | ICD-10-CM | POA: Diagnosis not present

## 2018-08-06 DIAGNOSIS — D631 Anemia in chronic kidney disease: Secondary | ICD-10-CM | POA: Diagnosis not present

## 2018-08-06 DIAGNOSIS — R17 Unspecified jaundice: Secondary | ICD-10-CM | POA: Diagnosis not present

## 2018-08-06 DIAGNOSIS — Z79899 Other long term (current) drug therapy: Secondary | ICD-10-CM | POA: Diagnosis not present

## 2018-08-06 DIAGNOSIS — R509 Fever, unspecified: Secondary | ICD-10-CM | POA: Diagnosis not present

## 2018-08-07 DIAGNOSIS — R509 Fever, unspecified: Secondary | ICD-10-CM | POA: Diagnosis not present

## 2018-08-07 DIAGNOSIS — N186 End stage renal disease: Secondary | ICD-10-CM | POA: Diagnosis not present

## 2018-08-07 DIAGNOSIS — Z79899 Other long term (current) drug therapy: Secondary | ICD-10-CM | POA: Diagnosis not present

## 2018-08-07 DIAGNOSIS — D631 Anemia in chronic kidney disease: Secondary | ICD-10-CM | POA: Diagnosis not present

## 2018-08-07 DIAGNOSIS — R17 Unspecified jaundice: Secondary | ICD-10-CM | POA: Diagnosis not present

## 2018-08-07 DIAGNOSIS — Z4931 Encounter for adequacy testing for hemodialysis: Secondary | ICD-10-CM | POA: Diagnosis not present

## 2018-08-10 DIAGNOSIS — D631 Anemia in chronic kidney disease: Secondary | ICD-10-CM | POA: Diagnosis not present

## 2018-08-10 DIAGNOSIS — Z4931 Encounter for adequacy testing for hemodialysis: Secondary | ICD-10-CM | POA: Diagnosis not present

## 2018-08-10 DIAGNOSIS — R509 Fever, unspecified: Secondary | ICD-10-CM | POA: Diagnosis not present

## 2018-08-10 DIAGNOSIS — Z79899 Other long term (current) drug therapy: Secondary | ICD-10-CM | POA: Diagnosis not present

## 2018-08-10 DIAGNOSIS — R17 Unspecified jaundice: Secondary | ICD-10-CM | POA: Diagnosis not present

## 2018-08-10 DIAGNOSIS — N186 End stage renal disease: Secondary | ICD-10-CM | POA: Diagnosis not present

## 2018-08-11 DIAGNOSIS — D631 Anemia in chronic kidney disease: Secondary | ICD-10-CM | POA: Diagnosis not present

## 2018-08-11 DIAGNOSIS — Z992 Dependence on renal dialysis: Secondary | ICD-10-CM | POA: Diagnosis not present

## 2018-08-11 DIAGNOSIS — Z89511 Acquired absence of right leg below knee: Secondary | ICD-10-CM | POA: Diagnosis not present

## 2018-08-11 DIAGNOSIS — Z8674 Personal history of sudden cardiac arrest: Secondary | ICD-10-CM | POA: Diagnosis not present

## 2018-08-11 DIAGNOSIS — R9439 Abnormal result of other cardiovascular function study: Secondary | ICD-10-CM | POA: Diagnosis not present

## 2018-08-11 DIAGNOSIS — Z940 Kidney transplant status: Secondary | ICD-10-CM | POA: Diagnosis not present

## 2018-08-11 DIAGNOSIS — I120 Hypertensive chronic kidney disease with stage 5 chronic kidney disease or end stage renal disease: Secondary | ICD-10-CM | POA: Diagnosis not present

## 2018-08-11 DIAGNOSIS — R17 Unspecified jaundice: Secondary | ICD-10-CM | POA: Diagnosis not present

## 2018-08-11 DIAGNOSIS — R079 Chest pain, unspecified: Secondary | ICD-10-CM | POA: Diagnosis not present

## 2018-08-11 DIAGNOSIS — N186 End stage renal disease: Secondary | ICD-10-CM | POA: Diagnosis not present

## 2018-08-11 DIAGNOSIS — R509 Fever, unspecified: Secondary | ICD-10-CM | POA: Diagnosis not present

## 2018-08-11 DIAGNOSIS — Z79899 Other long term (current) drug therapy: Secondary | ICD-10-CM | POA: Diagnosis not present

## 2018-08-11 DIAGNOSIS — E1022 Type 1 diabetes mellitus with diabetic chronic kidney disease: Secondary | ICD-10-CM | POA: Diagnosis not present

## 2018-08-11 DIAGNOSIS — Z4931 Encounter for adequacy testing for hemodialysis: Secondary | ICD-10-CM | POA: Diagnosis not present

## 2018-08-11 DIAGNOSIS — E875 Hyperkalemia: Secondary | ICD-10-CM | POA: Diagnosis not present

## 2018-08-12 DIAGNOSIS — E1022 Type 1 diabetes mellitus with diabetic chronic kidney disease: Secondary | ICD-10-CM | POA: Diagnosis not present

## 2018-08-12 DIAGNOSIS — N186 End stage renal disease: Secondary | ICD-10-CM | POA: Diagnosis not present

## 2018-08-12 DIAGNOSIS — Z992 Dependence on renal dialysis: Secondary | ICD-10-CM | POA: Diagnosis not present

## 2018-08-13 DIAGNOSIS — N2581 Secondary hyperparathyroidism of renal origin: Secondary | ICD-10-CM | POA: Diagnosis not present

## 2018-08-13 DIAGNOSIS — N2589 Other disorders resulting from impaired renal tubular function: Secondary | ICD-10-CM | POA: Diagnosis not present

## 2018-08-13 DIAGNOSIS — Z4931 Encounter for adequacy testing for hemodialysis: Secondary | ICD-10-CM | POA: Diagnosis not present

## 2018-08-13 DIAGNOSIS — I120 Hypertensive chronic kidney disease with stage 5 chronic kidney disease or end stage renal disease: Secondary | ICD-10-CM | POA: Diagnosis not present

## 2018-08-13 DIAGNOSIS — E1022 Type 1 diabetes mellitus with diabetic chronic kidney disease: Secondary | ICD-10-CM | POA: Diagnosis not present

## 2018-08-13 DIAGNOSIS — K7689 Other specified diseases of liver: Secondary | ICD-10-CM | POA: Diagnosis not present

## 2018-08-13 DIAGNOSIS — D631 Anemia in chronic kidney disease: Secondary | ICD-10-CM | POA: Diagnosis not present

## 2018-08-13 DIAGNOSIS — E7849 Other hyperlipidemia: Secondary | ICD-10-CM | POA: Diagnosis not present

## 2018-08-13 DIAGNOSIS — E8779 Other fluid overload: Secondary | ICD-10-CM | POA: Diagnosis not present

## 2018-08-13 DIAGNOSIS — R509 Fever, unspecified: Secondary | ICD-10-CM | POA: Diagnosis not present

## 2018-08-13 DIAGNOSIS — N186 End stage renal disease: Secondary | ICD-10-CM | POA: Diagnosis not present

## 2018-08-13 DIAGNOSIS — Z992 Dependence on renal dialysis: Secondary | ICD-10-CM | POA: Diagnosis not present

## 2018-08-14 DIAGNOSIS — N2581 Secondary hyperparathyroidism of renal origin: Secondary | ICD-10-CM | POA: Diagnosis not present

## 2018-08-14 DIAGNOSIS — Z4931 Encounter for adequacy testing for hemodialysis: Secondary | ICD-10-CM | POA: Diagnosis not present

## 2018-08-14 DIAGNOSIS — D631 Anemia in chronic kidney disease: Secondary | ICD-10-CM | POA: Diagnosis not present

## 2018-08-14 DIAGNOSIS — R509 Fever, unspecified: Secondary | ICD-10-CM | POA: Diagnosis not present

## 2018-08-14 DIAGNOSIS — N2589 Other disorders resulting from impaired renal tubular function: Secondary | ICD-10-CM | POA: Diagnosis not present

## 2018-08-14 DIAGNOSIS — N186 End stage renal disease: Secondary | ICD-10-CM | POA: Diagnosis not present

## 2018-08-17 DIAGNOSIS — Z4931 Encounter for adequacy testing for hemodialysis: Secondary | ICD-10-CM | POA: Diagnosis not present

## 2018-08-17 DIAGNOSIS — N2589 Other disorders resulting from impaired renal tubular function: Secondary | ICD-10-CM | POA: Diagnosis not present

## 2018-08-17 DIAGNOSIS — N186 End stage renal disease: Secondary | ICD-10-CM | POA: Diagnosis not present

## 2018-08-17 DIAGNOSIS — D631 Anemia in chronic kidney disease: Secondary | ICD-10-CM | POA: Diagnosis not present

## 2018-08-17 DIAGNOSIS — N2581 Secondary hyperparathyroidism of renal origin: Secondary | ICD-10-CM | POA: Diagnosis not present

## 2018-08-17 DIAGNOSIS — R509 Fever, unspecified: Secondary | ICD-10-CM | POA: Diagnosis not present

## 2018-08-18 DIAGNOSIS — R509 Fever, unspecified: Secondary | ICD-10-CM | POA: Diagnosis not present

## 2018-08-18 DIAGNOSIS — N186 End stage renal disease: Secondary | ICD-10-CM | POA: Diagnosis not present

## 2018-08-18 DIAGNOSIS — N2589 Other disorders resulting from impaired renal tubular function: Secondary | ICD-10-CM | POA: Diagnosis not present

## 2018-08-18 DIAGNOSIS — N2581 Secondary hyperparathyroidism of renal origin: Secondary | ICD-10-CM | POA: Diagnosis not present

## 2018-08-18 DIAGNOSIS — D631 Anemia in chronic kidney disease: Secondary | ICD-10-CM | POA: Diagnosis not present

## 2018-08-18 DIAGNOSIS — Z4931 Encounter for adequacy testing for hemodialysis: Secondary | ICD-10-CM | POA: Diagnosis not present

## 2018-08-19 DIAGNOSIS — Z79899 Other long term (current) drug therapy: Secondary | ICD-10-CM | POA: Diagnosis not present

## 2018-08-19 DIAGNOSIS — N186 End stage renal disease: Secondary | ICD-10-CM | POA: Diagnosis not present

## 2018-08-19 DIAGNOSIS — Z794 Long term (current) use of insulin: Secondary | ICD-10-CM | POA: Diagnosis not present

## 2018-08-19 DIAGNOSIS — R9431 Abnormal electrocardiogram [ECG] [EKG]: Secondary | ICD-10-CM | POA: Diagnosis not present

## 2018-08-19 DIAGNOSIS — I120 Hypertensive chronic kidney disease with stage 5 chronic kidney disease or end stage renal disease: Secondary | ICD-10-CM | POA: Diagnosis not present

## 2018-08-19 DIAGNOSIS — I491 Atrial premature depolarization: Secondary | ICD-10-CM | POA: Diagnosis not present

## 2018-08-19 DIAGNOSIS — R9439 Abnormal result of other cardiovascular function study: Secondary | ICD-10-CM | POA: Diagnosis not present

## 2018-08-19 DIAGNOSIS — I739 Peripheral vascular disease, unspecified: Secondary | ICD-10-CM | POA: Diagnosis not present

## 2018-08-19 DIAGNOSIS — I2582 Chronic total occlusion of coronary artery: Secondary | ICD-10-CM | POA: Diagnosis not present

## 2018-08-19 DIAGNOSIS — R7989 Other specified abnormal findings of blood chemistry: Secondary | ICD-10-CM | POA: Diagnosis not present

## 2018-08-19 DIAGNOSIS — Z992 Dependence on renal dialysis: Secondary | ICD-10-CM | POA: Diagnosis not present

## 2018-08-19 DIAGNOSIS — E785 Hyperlipidemia, unspecified: Secondary | ICD-10-CM | POA: Diagnosis not present

## 2018-08-19 DIAGNOSIS — Z89511 Acquired absence of right leg below knee: Secondary | ICD-10-CM | POA: Diagnosis not present

## 2018-08-19 DIAGNOSIS — E1122 Type 2 diabetes mellitus with diabetic chronic kidney disease: Secondary | ICD-10-CM | POA: Diagnosis not present

## 2018-08-19 DIAGNOSIS — I2510 Atherosclerotic heart disease of native coronary artery without angina pectoris: Secondary | ICD-10-CM | POA: Diagnosis not present

## 2018-08-20 DIAGNOSIS — I517 Cardiomegaly: Secondary | ICD-10-CM | POA: Diagnosis not present

## 2018-08-20 DIAGNOSIS — I2510 Atherosclerotic heart disease of native coronary artery without angina pectoris: Secondary | ICD-10-CM | POA: Diagnosis not present

## 2018-08-20 DIAGNOSIS — N2589 Other disorders resulting from impaired renal tubular function: Secondary | ICD-10-CM | POA: Diagnosis not present

## 2018-08-20 DIAGNOSIS — R9431 Abnormal electrocardiogram [ECG] [EKG]: Secondary | ICD-10-CM | POA: Diagnosis not present

## 2018-08-20 DIAGNOSIS — D631 Anemia in chronic kidney disease: Secondary | ICD-10-CM | POA: Diagnosis not present

## 2018-08-20 DIAGNOSIS — E1122 Type 2 diabetes mellitus with diabetic chronic kidney disease: Secondary | ICD-10-CM | POA: Diagnosis not present

## 2018-08-20 DIAGNOSIS — I491 Atrial premature depolarization: Secondary | ICD-10-CM | POA: Diagnosis not present

## 2018-08-20 DIAGNOSIS — N2581 Secondary hyperparathyroidism of renal origin: Secondary | ICD-10-CM | POA: Diagnosis not present

## 2018-08-20 DIAGNOSIS — I120 Hypertensive chronic kidney disease with stage 5 chronic kidney disease or end stage renal disease: Secondary | ICD-10-CM | POA: Diagnosis not present

## 2018-08-20 DIAGNOSIS — R9439 Abnormal result of other cardiovascular function study: Secondary | ICD-10-CM | POA: Diagnosis not present

## 2018-08-20 DIAGNOSIS — Z95818 Presence of other cardiac implants and grafts: Secondary | ICD-10-CM | POA: Diagnosis not present

## 2018-08-20 DIAGNOSIS — Z4931 Encounter for adequacy testing for hemodialysis: Secondary | ICD-10-CM | POA: Diagnosis not present

## 2018-08-20 DIAGNOSIS — R509 Fever, unspecified: Secondary | ICD-10-CM | POA: Diagnosis not present

## 2018-08-20 DIAGNOSIS — N186 End stage renal disease: Secondary | ICD-10-CM | POA: Diagnosis not present

## 2018-08-21 DIAGNOSIS — N2589 Other disorders resulting from impaired renal tubular function: Secondary | ICD-10-CM | POA: Diagnosis not present

## 2018-08-21 DIAGNOSIS — Z4931 Encounter for adequacy testing for hemodialysis: Secondary | ICD-10-CM | POA: Diagnosis not present

## 2018-08-21 DIAGNOSIS — D631 Anemia in chronic kidney disease: Secondary | ICD-10-CM | POA: Diagnosis not present

## 2018-08-21 DIAGNOSIS — N186 End stage renal disease: Secondary | ICD-10-CM | POA: Diagnosis not present

## 2018-08-21 DIAGNOSIS — R509 Fever, unspecified: Secondary | ICD-10-CM | POA: Diagnosis not present

## 2018-08-21 DIAGNOSIS — N2581 Secondary hyperparathyroidism of renal origin: Secondary | ICD-10-CM | POA: Diagnosis not present

## 2018-08-24 DIAGNOSIS — N186 End stage renal disease: Secondary | ICD-10-CM | POA: Diagnosis not present

## 2018-08-24 DIAGNOSIS — D631 Anemia in chronic kidney disease: Secondary | ICD-10-CM | POA: Diagnosis not present

## 2018-08-24 DIAGNOSIS — T80319A ABO incompatibility with hemolytic transfusion reaction, unspecified, initial encounter: Secondary | ICD-10-CM | POA: Diagnosis not present

## 2018-08-24 DIAGNOSIS — N2589 Other disorders resulting from impaired renal tubular function: Secondary | ICD-10-CM | POA: Diagnosis not present

## 2018-08-24 DIAGNOSIS — Z4931 Encounter for adequacy testing for hemodialysis: Secondary | ICD-10-CM | POA: Diagnosis not present

## 2018-08-24 DIAGNOSIS — Z01818 Encounter for other preprocedural examination: Secondary | ICD-10-CM | POA: Diagnosis not present

## 2018-08-24 DIAGNOSIS — R509 Fever, unspecified: Secondary | ICD-10-CM | POA: Diagnosis not present

## 2018-08-24 DIAGNOSIS — Z992 Dependence on renal dialysis: Secondary | ICD-10-CM | POA: Diagnosis not present

## 2018-08-24 DIAGNOSIS — N2581 Secondary hyperparathyroidism of renal origin: Secondary | ICD-10-CM | POA: Diagnosis not present

## 2018-08-25 DIAGNOSIS — Z4931 Encounter for adequacy testing for hemodialysis: Secondary | ICD-10-CM | POA: Diagnosis not present

## 2018-08-25 DIAGNOSIS — R509 Fever, unspecified: Secondary | ICD-10-CM | POA: Diagnosis not present

## 2018-08-25 DIAGNOSIS — N186 End stage renal disease: Secondary | ICD-10-CM | POA: Diagnosis not present

## 2018-08-25 DIAGNOSIS — Z955 Presence of coronary angioplasty implant and graft: Secondary | ICD-10-CM | POA: Diagnosis not present

## 2018-08-25 DIAGNOSIS — D631 Anemia in chronic kidney disease: Secondary | ICD-10-CM | POA: Diagnosis not present

## 2018-08-25 DIAGNOSIS — I2510 Atherosclerotic heart disease of native coronary artery without angina pectoris: Secondary | ICD-10-CM | POA: Diagnosis not present

## 2018-08-25 DIAGNOSIS — N2581 Secondary hyperparathyroidism of renal origin: Secondary | ICD-10-CM | POA: Diagnosis not present

## 2018-08-25 DIAGNOSIS — I120 Hypertensive chronic kidney disease with stage 5 chronic kidney disease or end stage renal disease: Secondary | ICD-10-CM | POA: Diagnosis not present

## 2018-08-25 DIAGNOSIS — N2589 Other disorders resulting from impaired renal tubular function: Secondary | ICD-10-CM | POA: Diagnosis not present

## 2018-08-26 ENCOUNTER — Telehealth (HOSPITAL_COMMUNITY): Payer: Self-pay | Admitting: *Deleted

## 2018-08-26 NOTE — Telephone Encounter (Signed)
Received notification for cardiac rehab for this pt s/p DES x 2 on 08/19/18  from Dr. Metta Clines at [facility] called and left message requesting a call back.  Will send over for signature md order, request for 12 lead ekg tracing and follow up visit. Noted that pt had follow up appt but there is not any information of the visit listed.  Will forward to support staff to determine pt insurance eligibility and benefit.  Will need clearance onsite cardiac rehab from pt nephrologist. Continue to follow. Cherre Huger, BSN Cardiac and Training and development officer

## 2018-08-27 ENCOUNTER — Other Ambulatory Visit: Payer: Self-pay

## 2018-08-27 ENCOUNTER — Ambulatory Visit (INDEPENDENT_AMBULATORY_CARE_PROVIDER_SITE_OTHER): Payer: Medicare Other | Admitting: Family Medicine

## 2018-08-27 ENCOUNTER — Telehealth (HOSPITAL_COMMUNITY): Payer: Self-pay

## 2018-08-27 ENCOUNTER — Encounter: Payer: Self-pay | Admitting: Family Medicine

## 2018-08-27 VITALS — BP 124/78 | Temp 97.4°F | Ht 75.0 in | Wt 234.0 lb

## 2018-08-27 DIAGNOSIS — R509 Fever, unspecified: Secondary | ICD-10-CM | POA: Diagnosis not present

## 2018-08-27 DIAGNOSIS — I10 Essential (primary) hypertension: Secondary | ICD-10-CM

## 2018-08-27 DIAGNOSIS — Z992 Dependence on renal dialysis: Secondary | ICD-10-CM

## 2018-08-27 DIAGNOSIS — D631 Anemia in chronic kidney disease: Secondary | ICD-10-CM | POA: Diagnosis not present

## 2018-08-27 DIAGNOSIS — N186 End stage renal disease: Secondary | ICD-10-CM

## 2018-08-27 DIAGNOSIS — Z4931 Encounter for adequacy testing for hemodialysis: Secondary | ICD-10-CM | POA: Diagnosis not present

## 2018-08-27 DIAGNOSIS — Z125 Encounter for screening for malignant neoplasm of prostate: Secondary | ICD-10-CM

## 2018-08-27 DIAGNOSIS — E1022 Type 1 diabetes mellitus with diabetic chronic kidney disease: Secondary | ICD-10-CM

## 2018-08-27 DIAGNOSIS — N2589 Other disorders resulting from impaired renal tubular function: Secondary | ICD-10-CM | POA: Diagnosis not present

## 2018-08-27 DIAGNOSIS — N183 Chronic kidney disease, stage 3 unspecified: Secondary | ICD-10-CM

## 2018-08-27 DIAGNOSIS — N2581 Secondary hyperparathyroidism of renal origin: Secondary | ICD-10-CM | POA: Diagnosis not present

## 2018-08-27 NOTE — Progress Notes (Signed)
   Subjective:    Patient ID: Lance [name], male    DOB: 01-18-1971, 48 y.o.   MRN: [id number]  HPIFollow up hospitalization. Had two stents put in July 8th at [facility] blockage while doing workup for kidney transplant. Pt states he is doing well and no concerns today.   Hospital records reviewed.  Patient this time feeling much better.  No chest symptoms.  No pain.  No shortness of breath.  Patient had catheterization which revealed arteries in need of stenting.  This was performed.  See hospital notes  Review of Systems No headache, no major weight loss or weight gain, no chest pain no back pain abdominal pain no change in bowel habits complete ROS otherwise negative     Objective:   Physical Exam   Alert vitals stable, NAD. Blood pressure good on repeat. HEENT normal. Lungs clear. Heart regular rate and rhythm.      Assessment & Plan:  Impression coronary artery disease.  Implications discussed.  Diet exercise discussed.  Importance of controlling risk factors discussed.  General concerns discussed.  Appropriate blood work recommended.  Further rec based on blood work

## 2018-08-27 NOTE — Telephone Encounter (Signed)
Pt insurance is active and benefits verified through Medicare A/B Co-pay $0.00, DED $198.00/$198.00 met, out of pocket $0.00/$0.00 met, co-insurance 20%. No pre-authorization required. Passport, 08/27/2018 @ 12:13PM, DIX#[id number]  2ndary insurance is active and benefits verified through [employer]. Co-pay $0.00, DED $2,000.00/$1,000.00 met, out of pocket $6,400.00/$3,350.00 met, co-insurance 20%. No pre-authorization required. Passport, 08/27/2018 @ 12:16PM, TXL#[id number]

## 2018-08-28 DIAGNOSIS — Z4931 Encounter for adequacy testing for hemodialysis: Secondary | ICD-10-CM | POA: Diagnosis not present

## 2018-08-28 DIAGNOSIS — R509 Fever, unspecified: Secondary | ICD-10-CM | POA: Diagnosis not present

## 2018-08-28 DIAGNOSIS — N2581 Secondary hyperparathyroidism of renal origin: Secondary | ICD-10-CM | POA: Diagnosis not present

## 2018-08-28 DIAGNOSIS — N186 End stage renal disease: Secondary | ICD-10-CM | POA: Diagnosis not present

## 2018-08-28 DIAGNOSIS — N2589 Other disorders resulting from impaired renal tubular function: Secondary | ICD-10-CM | POA: Diagnosis not present

## 2018-08-28 DIAGNOSIS — D631 Anemia in chronic kidney disease: Secondary | ICD-10-CM | POA: Diagnosis not present

## 2018-08-31 DIAGNOSIS — N186 End stage renal disease: Secondary | ICD-10-CM | POA: Diagnosis not present

## 2018-08-31 DIAGNOSIS — D631 Anemia in chronic kidney disease: Secondary | ICD-10-CM | POA: Diagnosis not present

## 2018-08-31 DIAGNOSIS — N2589 Other disorders resulting from impaired renal tubular function: Secondary | ICD-10-CM | POA: Diagnosis not present

## 2018-08-31 DIAGNOSIS — Z4931 Encounter for adequacy testing for hemodialysis: Secondary | ICD-10-CM | POA: Diagnosis not present

## 2018-08-31 DIAGNOSIS — N2581 Secondary hyperparathyroidism of renal origin: Secondary | ICD-10-CM | POA: Diagnosis not present

## 2018-08-31 DIAGNOSIS — R509 Fever, unspecified: Secondary | ICD-10-CM | POA: Diagnosis not present

## 2018-09-01 DIAGNOSIS — Z4931 Encounter for adequacy testing for hemodialysis: Secondary | ICD-10-CM | POA: Diagnosis not present

## 2018-09-01 DIAGNOSIS — N2589 Other disorders resulting from impaired renal tubular function: Secondary | ICD-10-CM | POA: Diagnosis not present

## 2018-09-01 DIAGNOSIS — D631 Anemia in chronic kidney disease: Secondary | ICD-10-CM | POA: Diagnosis not present

## 2018-09-01 DIAGNOSIS — R509 Fever, unspecified: Secondary | ICD-10-CM | POA: Diagnosis not present

## 2018-09-01 DIAGNOSIS — N186 End stage renal disease: Secondary | ICD-10-CM | POA: Diagnosis not present

## 2018-09-01 DIAGNOSIS — N2581 Secondary hyperparathyroidism of renal origin: Secondary | ICD-10-CM | POA: Diagnosis not present

## 2018-09-03 DIAGNOSIS — D631 Anemia in chronic kidney disease: Secondary | ICD-10-CM | POA: Diagnosis not present

## 2018-09-03 DIAGNOSIS — Z992 Dependence on renal dialysis: Secondary | ICD-10-CM | POA: Diagnosis not present

## 2018-09-03 DIAGNOSIS — N2581 Secondary hyperparathyroidism of renal origin: Secondary | ICD-10-CM | POA: Diagnosis not present

## 2018-09-03 DIAGNOSIS — R509 Fever, unspecified: Secondary | ICD-10-CM | POA: Diagnosis not present

## 2018-09-03 DIAGNOSIS — Z4931 Encounter for adequacy testing for hemodialysis: Secondary | ICD-10-CM | POA: Diagnosis not present

## 2018-09-03 DIAGNOSIS — N2589 Other disorders resulting from impaired renal tubular function: Secondary | ICD-10-CM | POA: Diagnosis not present

## 2018-09-03 DIAGNOSIS — N186 End stage renal disease: Secondary | ICD-10-CM | POA: Diagnosis not present

## 2018-09-03 DIAGNOSIS — I871 Compression of vein: Secondary | ICD-10-CM | POA: Diagnosis not present

## 2018-09-04 DIAGNOSIS — R509 Fever, unspecified: Secondary | ICD-10-CM | POA: Diagnosis not present

## 2018-09-04 DIAGNOSIS — Z4931 Encounter for adequacy testing for hemodialysis: Secondary | ICD-10-CM | POA: Diagnosis not present

## 2018-09-04 DIAGNOSIS — N186 End stage renal disease: Secondary | ICD-10-CM | POA: Diagnosis not present

## 2018-09-04 DIAGNOSIS — D631 Anemia in chronic kidney disease: Secondary | ICD-10-CM | POA: Diagnosis not present

## 2018-09-04 DIAGNOSIS — N2589 Other disorders resulting from impaired renal tubular function: Secondary | ICD-10-CM | POA: Diagnosis not present

## 2018-09-04 DIAGNOSIS — N2581 Secondary hyperparathyroidism of renal origin: Secondary | ICD-10-CM | POA: Diagnosis not present

## 2018-09-07 DIAGNOSIS — Z4931 Encounter for adequacy testing for hemodialysis: Secondary | ICD-10-CM | POA: Diagnosis not present

## 2018-09-07 DIAGNOSIS — D631 Anemia in chronic kidney disease: Secondary | ICD-10-CM | POA: Diagnosis not present

## 2018-09-07 DIAGNOSIS — N186 End stage renal disease: Secondary | ICD-10-CM | POA: Diagnosis not present

## 2018-09-07 DIAGNOSIS — N2581 Secondary hyperparathyroidism of renal origin: Secondary | ICD-10-CM | POA: Diagnosis not present

## 2018-09-07 DIAGNOSIS — N2589 Other disorders resulting from impaired renal tubular function: Secondary | ICD-10-CM | POA: Diagnosis not present

## 2018-09-07 DIAGNOSIS — R509 Fever, unspecified: Secondary | ICD-10-CM | POA: Diagnosis not present

## 2018-09-08 DIAGNOSIS — Z4931 Encounter for adequacy testing for hemodialysis: Secondary | ICD-10-CM | POA: Diagnosis not present

## 2018-09-08 DIAGNOSIS — D631 Anemia in chronic kidney disease: Secondary | ICD-10-CM | POA: Diagnosis not present

## 2018-09-08 DIAGNOSIS — N186 End stage renal disease: Secondary | ICD-10-CM | POA: Diagnosis not present

## 2018-09-08 DIAGNOSIS — N2581 Secondary hyperparathyroidism of renal origin: Secondary | ICD-10-CM | POA: Diagnosis not present

## 2018-09-08 DIAGNOSIS — R509 Fever, unspecified: Secondary | ICD-10-CM | POA: Diagnosis not present

## 2018-09-08 DIAGNOSIS — N2589 Other disorders resulting from impaired renal tubular function: Secondary | ICD-10-CM | POA: Diagnosis not present

## 2018-09-10 DIAGNOSIS — D631 Anemia in chronic kidney disease: Secondary | ICD-10-CM | POA: Diagnosis not present

## 2018-09-10 DIAGNOSIS — Z4931 Encounter for adequacy testing for hemodialysis: Secondary | ICD-10-CM | POA: Diagnosis not present

## 2018-09-10 DIAGNOSIS — R509 Fever, unspecified: Secondary | ICD-10-CM | POA: Diagnosis not present

## 2018-09-10 DIAGNOSIS — N2589 Other disorders resulting from impaired renal tubular function: Secondary | ICD-10-CM | POA: Diagnosis not present

## 2018-09-10 DIAGNOSIS — N186 End stage renal disease: Secondary | ICD-10-CM | POA: Diagnosis not present

## 2018-09-10 DIAGNOSIS — N2581 Secondary hyperparathyroidism of renal origin: Secondary | ICD-10-CM | POA: Diagnosis not present

## 2018-09-11 DIAGNOSIS — N2581 Secondary hyperparathyroidism of renal origin: Secondary | ICD-10-CM | POA: Diagnosis not present

## 2018-09-11 DIAGNOSIS — N2589 Other disorders resulting from impaired renal tubular function: Secondary | ICD-10-CM | POA: Diagnosis not present

## 2018-09-11 DIAGNOSIS — D631 Anemia in chronic kidney disease: Secondary | ICD-10-CM | POA: Diagnosis not present

## 2018-09-11 DIAGNOSIS — R509 Fever, unspecified: Secondary | ICD-10-CM | POA: Diagnosis not present

## 2018-09-11 DIAGNOSIS — Z4931 Encounter for adequacy testing for hemodialysis: Secondary | ICD-10-CM | POA: Diagnosis not present

## 2018-09-11 DIAGNOSIS — N186 End stage renal disease: Secondary | ICD-10-CM | POA: Diagnosis not present

## 2018-09-12 DIAGNOSIS — N186 End stage renal disease: Secondary | ICD-10-CM | POA: Diagnosis not present

## 2018-09-12 DIAGNOSIS — Z992 Dependence on renal dialysis: Secondary | ICD-10-CM | POA: Diagnosis not present

## 2018-09-12 DIAGNOSIS — E1022 Type 1 diabetes mellitus with diabetic chronic kidney disease: Secondary | ICD-10-CM | POA: Diagnosis not present

## 2018-09-14 ENCOUNTER — Other Ambulatory Visit: Payer: Self-pay

## 2018-09-14 DIAGNOSIS — N2581 Secondary hyperparathyroidism of renal origin: Secondary | ICD-10-CM | POA: Diagnosis not present

## 2018-09-14 DIAGNOSIS — D509 Iron deficiency anemia, unspecified: Secondary | ICD-10-CM | POA: Diagnosis not present

## 2018-09-14 DIAGNOSIS — I120 Hypertensive chronic kidney disease with stage 5 chronic kidney disease or end stage renal disease: Secondary | ICD-10-CM | POA: Diagnosis not present

## 2018-09-14 DIAGNOSIS — Z79899 Other long term (current) drug therapy: Secondary | ICD-10-CM | POA: Diagnosis not present

## 2018-09-14 DIAGNOSIS — E440 Moderate protein-calorie malnutrition: Secondary | ICD-10-CM | POA: Diagnosis not present

## 2018-09-14 DIAGNOSIS — I2510 Atherosclerotic heart disease of native coronary artery without angina pectoris: Secondary | ICD-10-CM | POA: Diagnosis not present

## 2018-09-14 DIAGNOSIS — Z4931 Encounter for adequacy testing for hemodialysis: Secondary | ICD-10-CM | POA: Diagnosis not present

## 2018-09-14 DIAGNOSIS — E1022 Type 1 diabetes mellitus with diabetic chronic kidney disease: Secondary | ICD-10-CM | POA: Diagnosis not present

## 2018-09-14 DIAGNOSIS — Z955 Presence of coronary angioplasty implant and graft: Secondary | ICD-10-CM | POA: Diagnosis not present

## 2018-09-14 DIAGNOSIS — Z95828 Presence of other vascular implants and grafts: Secondary | ICD-10-CM | POA: Diagnosis not present

## 2018-09-14 DIAGNOSIS — E785 Hyperlipidemia, unspecified: Secondary | ICD-10-CM | POA: Diagnosis not present

## 2018-09-14 DIAGNOSIS — N183 Chronic kidney disease, stage 3 unspecified: Secondary | ICD-10-CM

## 2018-09-14 DIAGNOSIS — D631 Anemia in chronic kidney disease: Secondary | ICD-10-CM | POA: Diagnosis not present

## 2018-09-14 DIAGNOSIS — I10 Essential (primary) hypertension: Secondary | ICD-10-CM | POA: Diagnosis not present

## 2018-09-14 DIAGNOSIS — Z7982 Long term (current) use of aspirin: Secondary | ICD-10-CM | POA: Diagnosis not present

## 2018-09-14 DIAGNOSIS — I739 Peripheral vascular disease, unspecified: Secondary | ICD-10-CM | POA: Diagnosis not present

## 2018-09-14 DIAGNOSIS — Z992 Dependence on renal dialysis: Secondary | ICD-10-CM | POA: Diagnosis not present

## 2018-09-14 DIAGNOSIS — N186 End stage renal disease: Secondary | ICD-10-CM | POA: Diagnosis not present

## 2018-09-14 DIAGNOSIS — R17 Unspecified jaundice: Secondary | ICD-10-CM | POA: Diagnosis not present

## 2018-09-14 DIAGNOSIS — Z89511 Acquired absence of right leg below knee: Secondary | ICD-10-CM | POA: Diagnosis not present

## 2018-09-14 DIAGNOSIS — I953 Hypotension of hemodialysis: Secondary | ICD-10-CM | POA: Diagnosis not present

## 2018-09-14 DIAGNOSIS — R509 Fever, unspecified: Secondary | ICD-10-CM | POA: Diagnosis not present

## 2018-09-14 DIAGNOSIS — E8779 Other fluid overload: Secondary | ICD-10-CM | POA: Diagnosis not present

## 2018-09-15 ENCOUNTER — Ambulatory Visit (HOSPITAL_COMMUNITY)
Admission: RE | Admit: 2018-09-15 | Discharge: 2018-09-15 | Disposition: A | Discharge status: Home / Self Care | Payer: Medicare Other | Source: Ambulatory Visit | Attending: Vascular Surgery | Admitting: Vascular Surgery

## 2018-09-15 ENCOUNTER — Other Ambulatory Visit: Payer: Self-pay

## 2018-09-15 ENCOUNTER — Ambulatory Visit (INDEPENDENT_AMBULATORY_CARE_PROVIDER_SITE_OTHER): Payer: Medicare Other | Admitting: Vascular Surgery

## 2018-09-15 ENCOUNTER — Encounter: Payer: Self-pay | Admitting: Vascular Surgery

## 2018-09-15 VITALS — BP 128/81 | HR 60 | Temp 97.7°F | Resp 20 | Ht 75.0 in | Wt 233.0 lb

## 2018-09-15 DIAGNOSIS — N183 Chronic kidney disease, stage 3 unspecified: Secondary | ICD-10-CM

## 2018-09-15 DIAGNOSIS — N2581 Secondary hyperparathyroidism of renal origin: Secondary | ICD-10-CM | POA: Diagnosis not present

## 2018-09-15 DIAGNOSIS — Z992 Dependence on renal dialysis: Secondary | ICD-10-CM

## 2018-09-15 DIAGNOSIS — N186 End stage renal disease: Secondary | ICD-10-CM | POA: Diagnosis not present

## 2018-09-15 DIAGNOSIS — R17 Unspecified jaundice: Secondary | ICD-10-CM | POA: Diagnosis not present

## 2018-09-15 DIAGNOSIS — Z4931 Encounter for adequacy testing for hemodialysis: Secondary | ICD-10-CM | POA: Diagnosis not present

## 2018-09-15 DIAGNOSIS — D631 Anemia in chronic kidney disease: Secondary | ICD-10-CM | POA: Diagnosis not present

## 2018-09-15 NOTE — Progress Notes (Signed)
[facility] [place]  Patient name: Lance [name] MRN: [id number] DOB: 08/23/1970 Sex: male  REASON FOR CONSULT: Evaluation of right upper arm AV fistula  HPI: Lance [name] is a 48 y.o. male, who is here today for evaluation of right upper arm AV fistula.  He is a very pleasant 48 year old gentleman with a long history of end-stage renal disease.  He had initial fistula creation with Dr. Scot Dock in May 2016.  He is on home hemodialysis.  He reports he has had several issues with access and is undergoing treatment at [facility] with [facility].  Most recently he reports that he had an intervention approximately 2 weeks ago.  Apparently he has had difficulty with his fistula and we have been requested to determine if there is a possibility for surgical revision.  Unfortunately I do not have his images from this intervention or report of this intervention.  Past Medical History:  Diagnosis Date  . Aortic stenosis    Very mild in 05/2007  . Arthritis    "right shoulder; right knee; feel like I've got it all over" (11/28/2015)  . CHF (congestive heart failure) ([place])   . Degenerative joint disease    Left TKA  . Diastolic heart failure (HCC)    LVEF 65-70% with grade 2 diastolic dysfunction  . ESRD (end stage renal disease) on dialysis [facility])    (as of 09/24/2017) pt does HD at home on a M/Tu/Th/F schedule.  . Essential hypertension, benign 2000   LVH  . GERD (gastroesophageal reflux disease)   . HCAP (healthcare-associated pneumonia) 11/28/2015  . Hyperlipidemia 2000  . Loculated pleural effusion 11/28/2015   [name] 11/28/2015  . Pneumonia 12/2013; 05/2014  . PSVT (paroxysmal supraventricular tachycardia) (HCC)    Nonsustained; asymptomatic; diagnosed by event recorder in 2006  . Tobacco abuse, in remission    20 pack years; discontinued in 1985; bronchitic changes on chest x-ray and 2009  . Type 1  diabetes mellitus ([place]) 1990    Family History  Problem Relation Age of Onset  . Diabetes Father   . Hypertension Father   . Diabetes Mother   . Hypertension Mother     SOCIAL HISTORY: Social History   Socioeconomic History  . Marital status: Married    Spouse name: Not on file  . Number of children: Not on file  . Years of education: Not on file  . Highest education level: Not on file  Occupational History  . Not on file  Social Needs  . Financial resource strain: Not on file  . Food insecurity    Worry: Not on file    Inability: Not on file  . Transportation needs    Medical: Not on file    Non-medical: Not on file  Tobacco Use  . Smoking status: Never Smoker  . Smokeless tobacco: Never Used  Substance and Sexual Activity  . Alcohol use: Yes    Comment: 11/28/2015 "used to drink; stopped ~ 2 yr ago"  . Drug use: No  . Sexual activity: Yes  Lifestyle  . Physical activity    Days per week: Not on file    Minutes per session: Not on file  . Stress: Not on file  Relationships  . Social Herbalist on phone: Not on file    Gets together: Not on file    Attends religious service: Not on file    Active member of club  or organization: Not on file    Attends meetings of clubs or organizations: Not on file    Relationship status: Not on file  . Intimate partner violence    Fear of current or ex partner: Not on file    Emotionally abused: Not on file    Physically abused: Not on file    Forced sexual activity: Not on file  Other Topics Concern  . Not on file  Social History Narrative  . Not on file    Allergies  Allergen Reactions  . Atorvastatin Other (See Comments)    Muscle pain   . Minoxidil Other (See Comments)    Fluid retention   . Adhesive [Tape] Other (See Comments)    MUST USE "SILK TAPE," AS ANY OTHER "BURNS" THE SKIN  . Statins Other (See Comments)    Muscle pain    Current Outpatient Medications  Medication Sig Dispense Refill   . amLODipine (NORVASC) 5 MG tablet Take by mouth.    Marland Kitchen aspirin EC 325 MG EC tablet Take 1 tablet (325 mg total) by mouth daily. 30 tablet 0  . calcitRIOL (ROCALTROL) 0.25 MCG capsule Take 1 capsule (0.25 mcg total) by mouth every Monday, Wednesday, and Friday with hemodialysis. (Patient taking differently: Take 0.25 mcg by mouth See admin instructions. Take 0.25 mcg by mouth once a day on Mon/Tues/Thurs/Fri with home dialysis) 60 capsule 0  . Continuous Blood Gluc Sensor (FREESTYLE LIBRE 14 DAY SENSOR) MISC 1 each by Does not apply route every 14 (fourteen) days. 2 each 5  . Continuous Blood Gluc Sensor (FREESTYLE LIBRE SENSOR SYSTEM) MISC APPLY 1 SENSOR TO THE SKIN EVERY 10 DAYS 3 each 3  . docusate sodium (COLACE) 100 MG capsule Take 1 capsule (100 mg total) by mouth 2 (two) times daily. (Patient taking differently: Take 200 mg by mouth daily as needed (when taking pain meds). ) 10 capsule 0  . glucose blood (ACCU-CHEK GUIDE) test strip Use as instructed 4 x daily. E11.65 150 each 5  . insulin aspart (NOVOLOG) 100 UNIT/ML injection Inject into the skin.    . Insulin Glargine (LANTUS SOLOSTAR) 100 UNIT/ML Solostar Pen Inject 34 Units into the skin daily. 45 mL 0  . insulin lispro (HUMALOG) 100 UNIT/ML KwikPen INJECT 15 TO 21 UNITS INTO THE SKIN THREE TIMES DAILY BEFORE MEALS 45 mL 0  . metoprolol succinate (TOPROL-XL) 25 MG 24 hr tablet Take by mouth.    . nitroGLYCERIN (NITROSTAT) 0.4 MG SL tablet Place under the tongue.    . ondansetron (ZOFRAN ODT) 4 MG disintegrating tablet Take 1 tablet (4 mg total) by mouth every 8 (eight) hours as needed for nausea or vomiting. 30 tablet 5  . pantoprazole (PROTONIX) 40 MG tablet TAKE 1 TABLET BY MOUTH EVERY DAY (Patient taking differently: Take 40 mg by mouth daily. ) 30 tablet 3  . pravastatin (PRAVACHOL) 10 MG tablet Take by mouth.    . sucroferric oxyhydroxide (VELPHORO) 500 MG chewable tablet Chew 3 tablets (1,500 mg total) by mouth 3 (three) times daily.  120 tablet 0  . ticagrelor (BRILINTA) 90 MG TABS tablet Take by mouth.     No current facility-administered medications for this visit.     REVIEW OF SYSTEMS:  [X]  denotes positive finding, [ ]  denotes negative finding Cardiac  Comments:  Chest pain or chest pressure:    Shortness of breath upon exertion:    Short of breath when lying flat:    Irregular heart rhythm:  Vascular    Pain in calf, thigh, or hip brought on by ambulation:    Pain in feet at night that wakes you up from your sleep:     Blood clot in your veins:    Leg swelling:         Pulmonary    Oxygen at home:    Productive cough:     Wheezing:         Neurologic    Sudden weakness in arms or legs:     Sudden numbness in arms or legs:     Sudden onset of difficulty speaking or slurred speech:    Temporary loss of vision in one eye:     Problems with dizziness:         Gastrointestinal    Blood in stool:     Vomited blood:         Genitourinary    Burning when urinating:     Blood in urine:        Psychiatric    Major depression:         Hematologic    Bleeding problems:    Problems with blood clotting too easily:        Skin    Rashes or ulcers:        Constitutional    Fever or chills:      PHYSICAL EXAM: Vitals:   09/15/18 1535  BP: 128/81  Pulse: 60  Resp: 20  Temp: 97.7 F (36.5 C)  TempSrc: Temporal  SpO2: 98%  Weight: 233 lb (105.7 kg)  Height: 6\' 3"  (1.905 m)    GENERAL: The patient is a well-nourished male, in no acute distress. The vital signs are documented above. 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  He does have a good thrill.  This does not appear appeared to be pulsatile in nature.  There is no skin erosion. PULMONARY: There is good air exchange  ABDOMEN: Soft and non-tender  MUSCULOSKELETAL: There are no major deformities or cyanosis. NEUROLOGIC: No focal weakness or paresthesias are detected. SKIN: There are no ulcers or  rashes noted. PSYCHIATRIC: The patient has a normal affect.  DATA:  He underwent duplex of his fistula today in our office which simply shows patency of the fistula with some dilatation and aneurysmal change throughout  MEDICAL ISSUES: I discussed this with the patient.  We will obtain the actual images of his most recent intervention to determine if there is any option for surgical revision.  Patient reports that he is having high arterial flow with specific placement of the needle.  I explained that there may be surgical revision possible and it may be that we would not have any options for revision.  Apparently he has had stenting up to the level of the shoulder in the past.  We will make further recommendations pending his review of his films   Rosetta Posner, MD [facility] Tel [phone] Pager [phone]

## 2018-09-17 DIAGNOSIS — Z4931 Encounter for adequacy testing for hemodialysis: Secondary | ICD-10-CM | POA: Diagnosis not present

## 2018-09-17 DIAGNOSIS — N186 End stage renal disease: Secondary | ICD-10-CM | POA: Diagnosis not present

## 2018-09-17 DIAGNOSIS — Z992 Dependence on renal dialysis: Secondary | ICD-10-CM | POA: Diagnosis not present

## 2018-09-17 DIAGNOSIS — N2581 Secondary hyperparathyroidism of renal origin: Secondary | ICD-10-CM | POA: Diagnosis not present

## 2018-09-17 DIAGNOSIS — D631 Anemia in chronic kidney disease: Secondary | ICD-10-CM | POA: Diagnosis not present

## 2018-09-17 DIAGNOSIS — R17 Unspecified jaundice: Secondary | ICD-10-CM | POA: Diagnosis not present

## 2018-09-18 DIAGNOSIS — Z992 Dependence on renal dialysis: Secondary | ICD-10-CM | POA: Diagnosis not present

## 2018-09-18 DIAGNOSIS — R17 Unspecified jaundice: Secondary | ICD-10-CM | POA: Diagnosis not present

## 2018-09-18 DIAGNOSIS — Z4931 Encounter for adequacy testing for hemodialysis: Secondary | ICD-10-CM | POA: Diagnosis not present

## 2018-09-18 DIAGNOSIS — D631 Anemia in chronic kidney disease: Secondary | ICD-10-CM | POA: Diagnosis not present

## 2018-09-18 DIAGNOSIS — N2581 Secondary hyperparathyroidism of renal origin: Secondary | ICD-10-CM | POA: Diagnosis not present

## 2018-09-18 DIAGNOSIS — N186 End stage renal disease: Secondary | ICD-10-CM | POA: Diagnosis not present

## 2018-09-21 DIAGNOSIS — N2581 Secondary hyperparathyroidism of renal origin: Secondary | ICD-10-CM | POA: Diagnosis not present

## 2018-09-21 DIAGNOSIS — D631 Anemia in chronic kidney disease: Secondary | ICD-10-CM | POA: Diagnosis not present

## 2018-09-21 DIAGNOSIS — Z992 Dependence on renal dialysis: Secondary | ICD-10-CM | POA: Diagnosis not present

## 2018-09-21 DIAGNOSIS — R17 Unspecified jaundice: Secondary | ICD-10-CM | POA: Diagnosis not present

## 2018-09-21 DIAGNOSIS — N186 End stage renal disease: Secondary | ICD-10-CM | POA: Diagnosis not present

## 2018-09-21 DIAGNOSIS — Z4931 Encounter for adequacy testing for hemodialysis: Secondary | ICD-10-CM | POA: Diagnosis not present

## 2018-09-22 DIAGNOSIS — Z992 Dependence on renal dialysis: Secondary | ICD-10-CM | POA: Diagnosis not present

## 2018-09-22 DIAGNOSIS — N2581 Secondary hyperparathyroidism of renal origin: Secondary | ICD-10-CM | POA: Diagnosis not present

## 2018-09-22 DIAGNOSIS — D631 Anemia in chronic kidney disease: Secondary | ICD-10-CM | POA: Diagnosis not present

## 2018-09-22 DIAGNOSIS — Z4931 Encounter for adequacy testing for hemodialysis: Secondary | ICD-10-CM | POA: Diagnosis not present

## 2018-09-22 DIAGNOSIS — N186 End stage renal disease: Secondary | ICD-10-CM | POA: Diagnosis not present

## 2018-09-22 DIAGNOSIS — R17 Unspecified jaundice: Secondary | ICD-10-CM | POA: Diagnosis not present

## 2018-09-24 DIAGNOSIS — N2581 Secondary hyperparathyroidism of renal origin: Secondary | ICD-10-CM | POA: Diagnosis not present

## 2018-09-24 DIAGNOSIS — N186 End stage renal disease: Secondary | ICD-10-CM | POA: Diagnosis not present

## 2018-09-24 DIAGNOSIS — Z992 Dependence on renal dialysis: Secondary | ICD-10-CM | POA: Diagnosis not present

## 2018-09-24 DIAGNOSIS — R17 Unspecified jaundice: Secondary | ICD-10-CM | POA: Diagnosis not present

## 2018-09-24 DIAGNOSIS — D631 Anemia in chronic kidney disease: Secondary | ICD-10-CM | POA: Diagnosis not present

## 2018-09-24 DIAGNOSIS — Z4931 Encounter for adequacy testing for hemodialysis: Secondary | ICD-10-CM | POA: Diagnosis not present

## 2018-09-25 DIAGNOSIS — Z992 Dependence on renal dialysis: Secondary | ICD-10-CM | POA: Diagnosis not present

## 2018-09-25 DIAGNOSIS — N186 End stage renal disease: Secondary | ICD-10-CM | POA: Diagnosis not present

## 2018-09-25 DIAGNOSIS — Z4931 Encounter for adequacy testing for hemodialysis: Secondary | ICD-10-CM | POA: Diagnosis not present

## 2018-09-25 DIAGNOSIS — D631 Anemia in chronic kidney disease: Secondary | ICD-10-CM | POA: Diagnosis not present

## 2018-09-25 DIAGNOSIS — R17 Unspecified jaundice: Secondary | ICD-10-CM | POA: Diagnosis not present

## 2018-09-25 DIAGNOSIS — N2581 Secondary hyperparathyroidism of renal origin: Secondary | ICD-10-CM | POA: Diagnosis not present

## 2018-09-28 DIAGNOSIS — D631 Anemia in chronic kidney disease: Secondary | ICD-10-CM | POA: Diagnosis not present

## 2018-09-28 DIAGNOSIS — R17 Unspecified jaundice: Secondary | ICD-10-CM | POA: Diagnosis not present

## 2018-09-28 DIAGNOSIS — N186 End stage renal disease: Secondary | ICD-10-CM | POA: Diagnosis not present

## 2018-09-28 DIAGNOSIS — N2581 Secondary hyperparathyroidism of renal origin: Secondary | ICD-10-CM | POA: Diagnosis not present

## 2018-09-28 DIAGNOSIS — Z4931 Encounter for adequacy testing for hemodialysis: Secondary | ICD-10-CM | POA: Diagnosis not present

## 2018-09-28 DIAGNOSIS — Z992 Dependence on renal dialysis: Secondary | ICD-10-CM | POA: Diagnosis not present

## 2018-09-29 DIAGNOSIS — D631 Anemia in chronic kidney disease: Secondary | ICD-10-CM | POA: Diagnosis not present

## 2018-09-29 DIAGNOSIS — Z4931 Encounter for adequacy testing for hemodialysis: Secondary | ICD-10-CM | POA: Diagnosis not present

## 2018-09-29 DIAGNOSIS — N2581 Secondary hyperparathyroidism of renal origin: Secondary | ICD-10-CM | POA: Diagnosis not present

## 2018-09-29 DIAGNOSIS — R17 Unspecified jaundice: Secondary | ICD-10-CM | POA: Diagnosis not present

## 2018-09-29 DIAGNOSIS — N186 End stage renal disease: Secondary | ICD-10-CM | POA: Diagnosis not present

## 2018-09-29 DIAGNOSIS — Z992 Dependence on renal dialysis: Secondary | ICD-10-CM | POA: Diagnosis not present

## 2018-10-01 DIAGNOSIS — N2581 Secondary hyperparathyroidism of renal origin: Secondary | ICD-10-CM | POA: Diagnosis not present

## 2018-10-01 DIAGNOSIS — N186 End stage renal disease: Secondary | ICD-10-CM | POA: Diagnosis not present

## 2018-10-01 DIAGNOSIS — R17 Unspecified jaundice: Secondary | ICD-10-CM | POA: Diagnosis not present

## 2018-10-01 DIAGNOSIS — Z4931 Encounter for adequacy testing for hemodialysis: Secondary | ICD-10-CM | POA: Diagnosis not present

## 2018-10-01 DIAGNOSIS — D631 Anemia in chronic kidney disease: Secondary | ICD-10-CM | POA: Diagnosis not present

## 2018-10-01 DIAGNOSIS — Z992 Dependence on renal dialysis: Secondary | ICD-10-CM | POA: Diagnosis not present

## 2018-10-02 DIAGNOSIS — D631 Anemia in chronic kidney disease: Secondary | ICD-10-CM | POA: Diagnosis not present

## 2018-10-02 DIAGNOSIS — N186 End stage renal disease: Secondary | ICD-10-CM | POA: Diagnosis not present

## 2018-10-02 DIAGNOSIS — N2581 Secondary hyperparathyroidism of renal origin: Secondary | ICD-10-CM | POA: Diagnosis not present

## 2018-10-02 DIAGNOSIS — R17 Unspecified jaundice: Secondary | ICD-10-CM | POA: Diagnosis not present

## 2018-10-02 DIAGNOSIS — Z4931 Encounter for adequacy testing for hemodialysis: Secondary | ICD-10-CM | POA: Diagnosis not present

## 2018-10-02 DIAGNOSIS — Z992 Dependence on renal dialysis: Secondary | ICD-10-CM | POA: Diagnosis not present

## 2018-10-05 ENCOUNTER — Other Ambulatory Visit: Payer: Self-pay

## 2018-10-05 ENCOUNTER — Ambulatory Visit (INDEPENDENT_AMBULATORY_CARE_PROVIDER_SITE_OTHER): Payer: Medicare Other | Admitting: Family Medicine

## 2018-10-05 ENCOUNTER — Encounter: Payer: Self-pay | Admitting: Family Medicine

## 2018-10-05 DIAGNOSIS — R21 Rash and other nonspecific skin eruption: Secondary | ICD-10-CM | POA: Diagnosis not present

## 2018-10-05 DIAGNOSIS — Z4931 Encounter for adequacy testing for hemodialysis: Secondary | ICD-10-CM | POA: Diagnosis not present

## 2018-10-05 DIAGNOSIS — D631 Anemia in chronic kidney disease: Secondary | ICD-10-CM | POA: Diagnosis not present

## 2018-10-05 DIAGNOSIS — R17 Unspecified jaundice: Secondary | ICD-10-CM | POA: Diagnosis not present

## 2018-10-05 DIAGNOSIS — N2581 Secondary hyperparathyroidism of renal origin: Secondary | ICD-10-CM | POA: Diagnosis not present

## 2018-10-05 DIAGNOSIS — Z992 Dependence on renal dialysis: Secondary | ICD-10-CM | POA: Diagnosis not present

## 2018-10-05 DIAGNOSIS — N186 End stage renal disease: Secondary | ICD-10-CM | POA: Diagnosis not present

## 2018-10-05 MED ORDER — HYDROCODONE-ACETAMINOPHEN 5-325 MG PO TABS: ORAL_TABLET | ORAL | 0 refills | Status: DC

## 2018-10-05 MED ORDER — VALACYCLOVIR HCL 500 MG PO TABS: ORAL_TABLET | ORAL | 0 refills | Status: DC

## 2018-10-05 MED ORDER — HYDROCODONE-ACETAMINOPHEN 5-325MG PREPACK (~~LOC~~: ORAL_TABLET | ORAL | 0 refills | Status: DC

## 2018-10-05 NOTE — Addendum Note (Signed)
Addended by: Vicente Males on: 10/05/2018 11:36 AM   Modules accepted: Orders

## 2018-10-05 NOTE — Addendum Note (Signed)
Addended by: [name] on: 10/05/2018 02:20 PM   Modules accepted: Orders

## 2018-10-05 NOTE — Progress Notes (Signed)
   Subjective:  Audio plus video  Patient ID: Lance [name], male    DOB: July 12, 1970, 48 y.o.   MRN: [id number]  HPI Pt noticed rash Saturday night. Pt states the rash is on his left shoulder blade and back. Rash is painful, itching, and red. Pt has put peroxide on area when it began to itch. No fever, no cough.  Virtual Visit via Video Note  I connected with Lance [name] on 10/05/18 at  9:30 AM EDT by a video enabled telemedicine application and verified that I am speaking with the correct person using two identifiers.  Location: Patient: home Provider: office   I discussed the limitations of evaluation and management by telemedicine and the availability of in person appointments. The patient expressed understanding and agreed to proceed.  History of Present Illness:    Observations/Objective:   Assessment and Plan:   Follow Up Instructions:    I discussed the assessment and treatment plan with the patient. The patient was provided an opportunity to ask questions and all were answered. The patient agreed with the plan and demonstrated an understanding of the instructions.   The patient was advised to call back or seek an in-person evaluation if the symptoms worsen or if the condition fails to improve as anticipated.  I provided 18 minutes of non-face-to-face time during this encounter.   Vicente Males, LPN  Rest painful nature.  Couple small patches blisters.  Pain is sharp at times.  Aching at times.  Does have a history of chickenpox  Review of Systems No headache no chest pain no fever    Objective:   Physical Exam  Virtual      Assessment & Plan:  Impression shingles educated at length plan Valtrex dose adjusted for chronic renal failure on dialysis.  Pain medication local measures discussed

## 2018-10-05 NOTE — Addendum Note (Signed)
Addended by: Vicente Males on: 10/05/2018 01:24 PM   Modules accepted: Orders

## 2018-10-05 NOTE — Addendum Note (Signed)
Addended by: [name] on: 10/05/2018 01:26 PM   Modules accepted: Orders

## 2018-10-05 NOTE — Addendum Note (Signed)
Addended by: Dairl Ponder on: 10/05/2018 01:32 PM   Modules accepted: Orders

## 2018-10-06 DIAGNOSIS — R17 Unspecified jaundice: Secondary | ICD-10-CM | POA: Diagnosis not present

## 2018-10-06 DIAGNOSIS — D631 Anemia in chronic kidney disease: Secondary | ICD-10-CM | POA: Diagnosis not present

## 2018-10-06 DIAGNOSIS — N186 End stage renal disease: Secondary | ICD-10-CM | POA: Diagnosis not present

## 2018-10-06 DIAGNOSIS — N2581 Secondary hyperparathyroidism of renal origin: Secondary | ICD-10-CM | POA: Diagnosis not present

## 2018-10-06 DIAGNOSIS — Z992 Dependence on renal dialysis: Secondary | ICD-10-CM | POA: Diagnosis not present

## 2018-10-06 DIAGNOSIS — Z4931 Encounter for adequacy testing for hemodialysis: Secondary | ICD-10-CM | POA: Diagnosis not present

## 2018-10-08 DIAGNOSIS — R17 Unspecified jaundice: Secondary | ICD-10-CM | POA: Diagnosis not present

## 2018-10-08 DIAGNOSIS — N186 End stage renal disease: Secondary | ICD-10-CM | POA: Diagnosis not present

## 2018-10-08 DIAGNOSIS — Z992 Dependence on renal dialysis: Secondary | ICD-10-CM | POA: Diagnosis not present

## 2018-10-08 DIAGNOSIS — N2581 Secondary hyperparathyroidism of renal origin: Secondary | ICD-10-CM | POA: Diagnosis not present

## 2018-10-08 DIAGNOSIS — D631 Anemia in chronic kidney disease: Secondary | ICD-10-CM | POA: Diagnosis not present

## 2018-10-08 DIAGNOSIS — Z4931 Encounter for adequacy testing for hemodialysis: Secondary | ICD-10-CM | POA: Diagnosis not present

## 2018-10-09 DIAGNOSIS — Z992 Dependence on renal dialysis: Secondary | ICD-10-CM | POA: Diagnosis not present

## 2018-10-09 DIAGNOSIS — R17 Unspecified jaundice: Secondary | ICD-10-CM | POA: Diagnosis not present

## 2018-10-09 DIAGNOSIS — Z4931 Encounter for adequacy testing for hemodialysis: Secondary | ICD-10-CM | POA: Diagnosis not present

## 2018-10-09 DIAGNOSIS — D631 Anemia in chronic kidney disease: Secondary | ICD-10-CM | POA: Diagnosis not present

## 2018-10-09 DIAGNOSIS — N186 End stage renal disease: Secondary | ICD-10-CM | POA: Diagnosis not present

## 2018-10-09 DIAGNOSIS — N2581 Secondary hyperparathyroidism of renal origin: Secondary | ICD-10-CM | POA: Diagnosis not present

## 2018-10-12 ENCOUNTER — Ambulatory Visit: Payer: BLUE CROSS/BLUE SHIELD | Admitting: "Endocrinology

## 2018-10-12 DIAGNOSIS — Z4931 Encounter for adequacy testing for hemodialysis: Secondary | ICD-10-CM | POA: Diagnosis not present

## 2018-10-12 DIAGNOSIS — R17 Unspecified jaundice: Secondary | ICD-10-CM | POA: Diagnosis not present

## 2018-10-12 DIAGNOSIS — N2581 Secondary hyperparathyroidism of renal origin: Secondary | ICD-10-CM | POA: Diagnosis not present

## 2018-10-12 DIAGNOSIS — D631 Anemia in chronic kidney disease: Secondary | ICD-10-CM | POA: Diagnosis not present

## 2018-10-12 DIAGNOSIS — Z992 Dependence on renal dialysis: Secondary | ICD-10-CM | POA: Diagnosis not present

## 2018-10-12 DIAGNOSIS — N186 End stage renal disease: Secondary | ICD-10-CM | POA: Diagnosis not present

## 2018-10-13 DIAGNOSIS — K7689 Other specified diseases of liver: Secondary | ICD-10-CM | POA: Diagnosis not present

## 2018-10-13 DIAGNOSIS — I120 Hypertensive chronic kidney disease with stage 5 chronic kidney disease or end stage renal disease: Secondary | ICD-10-CM | POA: Diagnosis not present

## 2018-10-13 DIAGNOSIS — R509 Fever, unspecified: Secondary | ICD-10-CM | POA: Diagnosis not present

## 2018-10-13 DIAGNOSIS — D631 Anemia in chronic kidney disease: Secondary | ICD-10-CM | POA: Diagnosis not present

## 2018-10-13 DIAGNOSIS — N186 End stage renal disease: Secondary | ICD-10-CM | POA: Diagnosis not present

## 2018-10-13 DIAGNOSIS — E440 Moderate protein-calorie malnutrition: Secondary | ICD-10-CM | POA: Diagnosis not present

## 2018-10-13 DIAGNOSIS — N2581 Secondary hyperparathyroidism of renal origin: Secondary | ICD-10-CM | POA: Diagnosis not present

## 2018-10-13 DIAGNOSIS — Z23 Encounter for immunization: Secondary | ICD-10-CM | POA: Diagnosis not present

## 2018-10-13 DIAGNOSIS — Z992 Dependence on renal dialysis: Secondary | ICD-10-CM | POA: Diagnosis not present

## 2018-10-13 DIAGNOSIS — Z4931 Encounter for adequacy testing for hemodialysis: Secondary | ICD-10-CM | POA: Diagnosis not present

## 2018-10-13 DIAGNOSIS — E8779 Other fluid overload: Secondary | ICD-10-CM | POA: Diagnosis not present

## 2018-10-13 DIAGNOSIS — E1022 Type 1 diabetes mellitus with diabetic chronic kidney disease: Secondary | ICD-10-CM | POA: Diagnosis not present

## 2018-10-13 DIAGNOSIS — Z79899 Other long term (current) drug therapy: Secondary | ICD-10-CM | POA: Diagnosis not present

## 2018-10-13 DIAGNOSIS — R17 Unspecified jaundice: Secondary | ICD-10-CM | POA: Diagnosis not present

## 2018-10-14 ENCOUNTER — Telehealth: Payer: Self-pay | Admitting: *Deleted

## 2018-10-14 DIAGNOSIS — N186 End stage renal disease: Secondary | ICD-10-CM | POA: Diagnosis not present

## 2018-10-14 DIAGNOSIS — E1022 Type 1 diabetes mellitus with diabetic chronic kidney disease: Secondary | ICD-10-CM | POA: Diagnosis not present

## 2018-10-14 NOTE — Telephone Encounter (Signed)
Spoke with Designer, television/film set at [facility]. After reviewing films from [facility], Dr. Donnetta Hutching recommends office appointment with [facility] to discuss options for access if he continues to have pressure problems.  If current access working leave along for now. I told Margreta Journey to call me if appointment needed. She will share this information with patient and staff at dialysis center.

## 2018-10-15 DIAGNOSIS — N2581 Secondary hyperparathyroidism of renal origin: Secondary | ICD-10-CM | POA: Diagnosis not present

## 2018-10-15 DIAGNOSIS — Z4931 Encounter for adequacy testing for hemodialysis: Secondary | ICD-10-CM | POA: Diagnosis not present

## 2018-10-15 DIAGNOSIS — K7689 Other specified diseases of liver: Secondary | ICD-10-CM | POA: Diagnosis not present

## 2018-10-15 DIAGNOSIS — E440 Moderate protein-calorie malnutrition: Secondary | ICD-10-CM | POA: Diagnosis not present

## 2018-10-15 DIAGNOSIS — N186 End stage renal disease: Secondary | ICD-10-CM | POA: Diagnosis not present

## 2018-10-15 DIAGNOSIS — Z992 Dependence on renal dialysis: Secondary | ICD-10-CM | POA: Diagnosis not present

## 2018-10-15 LAB — COMPLETE METABOLIC PANEL WITH GFR
AG Ratio: 1.2 (calc) (ref 1.0–2.5)
ALT: 15 U/L (ref 9–46)
AST: 11 U/L (ref 10–40)
Albumin: 4.3 g/dL (ref 3.6–5.1)
Alkaline phosphatase (APISO): 121 U/L (ref 36–130)
BUN/Creatinine Ratio: 3 (calc) — ABNORMAL LOW (ref 6–22)
BUN: 27 mg/dL — ABNORMAL HIGH (ref 7–25)
CO2: 26 mmol/L (ref 20–32)
Calcium: 9.4 mg/dL (ref 8.6–10.3)
Chloride: 91 mmol/L — ABNORMAL LOW (ref 98–110)
Creat: 7.99 mg/dL — ABNORMAL HIGH (ref 0.60–1.35)
GFR, Est African American: 8 mL/min/{1.73_m2} — ABNORMAL LOW (ref >=60)
GFR, Est Non African American: 7 mL/min/{1.73_m2} — ABNORMAL LOW (ref >=60)
Globulin: 3.6 g/dL (calc) (ref 1.9–3.7)
Glucose, Bld: 305 mg/dL — ABNORMAL HIGH (ref 65–139)
Potassium: 4.3 mmol/L (ref 3.5–5.3)
Sodium: 131 mmol/L — ABNORMAL LOW (ref 135–146)
Total Bilirubin: 0.7 mg/dL (ref 0.2–1.2)
Total Protein: 7.9 g/dL (ref 6.1–8.1)

## 2018-10-15 LAB — HEMOGLOBIN A1C
Hgb A1c MFr Bld: 8.6 % of total Hgb — ABNORMAL HIGH (ref <5.7)
Mean Plasma Glucose: 200 (calc)
eAG (mmol/L): 11.1 (calc)

## 2018-10-16 DIAGNOSIS — Z992 Dependence on renal dialysis: Secondary | ICD-10-CM | POA: Diagnosis not present

## 2018-10-16 DIAGNOSIS — N186 End stage renal disease: Secondary | ICD-10-CM | POA: Diagnosis not present

## 2018-10-16 DIAGNOSIS — K7689 Other specified diseases of liver: Secondary | ICD-10-CM | POA: Diagnosis not present

## 2018-10-16 DIAGNOSIS — Z4931 Encounter for adequacy testing for hemodialysis: Secondary | ICD-10-CM | POA: Diagnosis not present

## 2018-10-16 DIAGNOSIS — N2581 Secondary hyperparathyroidism of renal origin: Secondary | ICD-10-CM | POA: Diagnosis not present

## 2018-10-16 DIAGNOSIS — E440 Moderate protein-calorie malnutrition: Secondary | ICD-10-CM | POA: Diagnosis not present

## 2018-10-19 DIAGNOSIS — Z4931 Encounter for adequacy testing for hemodialysis: Secondary | ICD-10-CM | POA: Diagnosis not present

## 2018-10-19 DIAGNOSIS — K7689 Other specified diseases of liver: Secondary | ICD-10-CM | POA: Diagnosis not present

## 2018-10-19 DIAGNOSIS — E440 Moderate protein-calorie malnutrition: Secondary | ICD-10-CM | POA: Diagnosis not present

## 2018-10-19 DIAGNOSIS — N186 End stage renal disease: Secondary | ICD-10-CM | POA: Diagnosis not present

## 2018-10-19 DIAGNOSIS — N2581 Secondary hyperparathyroidism of renal origin: Secondary | ICD-10-CM | POA: Diagnosis not present

## 2018-10-19 DIAGNOSIS — Z992 Dependence on renal dialysis: Secondary | ICD-10-CM | POA: Diagnosis not present

## 2018-10-20 DIAGNOSIS — Z992 Dependence on renal dialysis: Secondary | ICD-10-CM | POA: Diagnosis not present

## 2018-10-20 DIAGNOSIS — K7689 Other specified diseases of liver: Secondary | ICD-10-CM | POA: Diagnosis not present

## 2018-10-20 DIAGNOSIS — N2581 Secondary hyperparathyroidism of renal origin: Secondary | ICD-10-CM | POA: Diagnosis not present

## 2018-10-20 DIAGNOSIS — E440 Moderate protein-calorie malnutrition: Secondary | ICD-10-CM | POA: Diagnosis not present

## 2018-10-20 DIAGNOSIS — N186 End stage renal disease: Secondary | ICD-10-CM | POA: Diagnosis not present

## 2018-10-20 DIAGNOSIS — Z4931 Encounter for adequacy testing for hemodialysis: Secondary | ICD-10-CM | POA: Diagnosis not present

## 2018-10-22 DIAGNOSIS — E440 Moderate protein-calorie malnutrition: Secondary | ICD-10-CM | POA: Diagnosis not present

## 2018-10-22 DIAGNOSIS — N2581 Secondary hyperparathyroidism of renal origin: Secondary | ICD-10-CM | POA: Diagnosis not present

## 2018-10-22 DIAGNOSIS — N186 End stage renal disease: Secondary | ICD-10-CM | POA: Diagnosis not present

## 2018-10-22 DIAGNOSIS — K7689 Other specified diseases of liver: Secondary | ICD-10-CM | POA: Diagnosis not present

## 2018-10-22 DIAGNOSIS — Z4931 Encounter for adequacy testing for hemodialysis: Secondary | ICD-10-CM | POA: Diagnosis not present

## 2018-10-22 DIAGNOSIS — Z992 Dependence on renal dialysis: Secondary | ICD-10-CM | POA: Diagnosis not present

## 2018-10-23 DIAGNOSIS — E440 Moderate protein-calorie malnutrition: Secondary | ICD-10-CM | POA: Diagnosis not present

## 2018-10-23 DIAGNOSIS — N2581 Secondary hyperparathyroidism of renal origin: Secondary | ICD-10-CM | POA: Diagnosis not present

## 2018-10-23 DIAGNOSIS — R079 Chest pain, unspecified: Secondary | ICD-10-CM | POA: Diagnosis not present

## 2018-10-23 DIAGNOSIS — R42 Dizziness and giddiness: Secondary | ICD-10-CM | POA: Diagnosis not present

## 2018-10-23 DIAGNOSIS — Z992 Dependence on renal dialysis: Secondary | ICD-10-CM | POA: Diagnosis not present

## 2018-10-23 DIAGNOSIS — K7689 Other specified diseases of liver: Secondary | ICD-10-CM | POA: Diagnosis not present

## 2018-10-23 DIAGNOSIS — N186 End stage renal disease: Secondary | ICD-10-CM | POA: Diagnosis not present

## 2018-10-23 DIAGNOSIS — R072 Precordial pain: Secondary | ICD-10-CM | POA: Diagnosis not present

## 2018-10-23 DIAGNOSIS — Z4931 Encounter for adequacy testing for hemodialysis: Secondary | ICD-10-CM | POA: Diagnosis not present

## 2018-10-24 ENCOUNTER — Encounter (HOSPITAL_COMMUNITY): Payer: Self-pay | Admitting: Emergency Medicine

## 2018-10-24 ENCOUNTER — Other Ambulatory Visit: Payer: Self-pay

## 2018-10-24 ENCOUNTER — Emergency Department (HOSPITAL_COMMUNITY): Payer: Medicare Other

## 2018-10-24 ENCOUNTER — Emergency Department (HOSPITAL_COMMUNITY)
Admission: EM | Admit: 2018-10-24 | Discharge: 2018-10-24 | Disposition: A | Discharge status: Home / Self Care | Payer: Medicare Other | Attending: Emergency Medicine | Admitting: Emergency Medicine

## 2018-10-24 DIAGNOSIS — E1022 Type 1 diabetes mellitus with diabetic chronic kidney disease: Secondary | ICD-10-CM | POA: Diagnosis not present

## 2018-10-24 DIAGNOSIS — Z794 Long term (current) use of insulin: Secondary | ICD-10-CM | POA: Diagnosis not present

## 2018-10-24 DIAGNOSIS — R072 Precordial pain: Secondary | ICD-10-CM | POA: Diagnosis not present

## 2018-10-24 DIAGNOSIS — N186 End stage renal disease: Secondary | ICD-10-CM | POA: Diagnosis not present

## 2018-10-24 DIAGNOSIS — I132 Hypertensive heart and chronic kidney disease with heart failure and with stage 5 chronic kidney disease, or end stage renal disease: Secondary | ICD-10-CM | POA: Diagnosis not present

## 2018-10-24 DIAGNOSIS — I509 Heart failure, unspecified: Secondary | ICD-10-CM | POA: Diagnosis not present

## 2018-10-24 DIAGNOSIS — R079 Chest pain, unspecified: Secondary | ICD-10-CM | POA: Diagnosis not present

## 2018-10-24 DIAGNOSIS — Z79899 Other long term (current) drug therapy: Secondary | ICD-10-CM | POA: Insufficient documentation

## 2018-10-24 DIAGNOSIS — Z7982 Long term (current) use of aspirin: Secondary | ICD-10-CM | POA: Insufficient documentation

## 2018-10-24 DIAGNOSIS — Z992 Dependence on renal dialysis: Secondary | ICD-10-CM | POA: Diagnosis not present

## 2018-10-24 LAB — BASIC METABOLIC PANEL
Anion gap: 15 (ref 5–15)
BUN: 29 mg/dL — ABNORMAL HIGH (ref 6–20)
CO2: 24 mmol/L (ref 22–32)
Calcium: 9 mg/dL (ref 8.9–10.3)
Chloride: 94 mmol/L — ABNORMAL LOW (ref 98–111)
Creatinine, Ser: 7.44 mg/dL — ABNORMAL HIGH (ref 0.61–1.24)
GFR calc Af Amer: 9 mL/min — ABNORMAL LOW (ref >60)
GFR calc non Af Amer: 8 mL/min — ABNORMAL LOW (ref >60)
Glucose, Bld: 167 mg/dL — ABNORMAL HIGH (ref 70–99)
Potassium: 4 mmol/L (ref 3.5–5.1)
Sodium: 133 mmol/L — ABNORMAL LOW (ref 135–145)

## 2018-10-24 LAB — CBC
HCT: 44.8 % (ref 39.0–52.0)
Hemoglobin: 15.4 g/dL (ref 13.0–17.0)
MCH: 34.9 pg — ABNORMAL HIGH (ref 26.0–34.0)
MCHC: 34.4 g/dL (ref 30.0–36.0)
MCV: 101.6 fL — ABNORMAL HIGH (ref 80.0–100.0)
Platelets: 228 10*3/uL (ref 150–400)
RBC: 4.41 MIL/uL (ref 4.22–5.81)
RDW: 15.1 % (ref 11.5–15.5)
WBC: 9.2 10*3/uL (ref 4.0–10.5)
nRBC: 0 % (ref 0.0–0.2)

## 2018-10-24 LAB — TROPONIN I (HIGH SENSITIVITY)
Troponin I (High Sensitivity): 49 ng/L — ABNORMAL HIGH (ref <18)
Troponin I (High Sensitivity): 45 ng/L — ABNORMAL HIGH (ref <18)

## 2018-10-24 MED ORDER — SODIUM CHLORIDE 0.9% FLUSH: 3.0000 mL | Freq: Once | INTRAVENOUS | Status: DC

## 2018-10-24 NOTE — ED Triage Notes (Signed)
Pt BIB [facility] from home c/o chest pain. Onset: while completing home dialysis. CP right side and center chest described as pressure, 7 out of 10, with dizziness, "I felt like I was going to pass out." Pt. Stopped home dialysis treatment, with s/s subsiding. Took 324 aspirin at home. Pt denies CP/SOB at this time.  HX cardiac arrest secondary to hyperkalemia, DM, HTN, and family HX of MI   [facility] VS BP 123/81 HR 96 RR 16 SpO2 98% RA

## 2018-10-24 NOTE — ED Notes (Signed)
Attempted IV access left lower forearm. Pt hard stick and dialysis. Will request phlebotomy for blood draw.

## 2018-10-24 NOTE — Discharge Instructions (Signed)
You were seen in the emergency department today for chest pain. Your work-up in the emergency department has been overall reassuring. Your labs have been fairly normal and or similar to previous blood work you have had done. Your EKG and the enzyme we use to check your heart did not show an acute heart attack at this time. Your chest x-ray was normal.   Please call your cardiologist within the next 2 days to alert them of your visit and to have close follow-up.Marland Kitchen Return to the ER immediately should you experience any new or worsening symptoms including but not limited to return of pain, worsened pain, vomiting, shortness of breath, dizziness, lightheadedness, passing out, or any other concerns that you may have.

## 2018-10-24 NOTE — ED Notes (Signed)
Pt continues to denie dizziness/SOB/CP.

## 2018-10-24 NOTE — ED Notes (Signed)
Discharge instructions discussed with pt. And wife at bedside. Pt verbalized understanding with no questions at this time.

## 2018-10-24 NOTE — ED Notes (Signed)
Patient transported to X-ray 

## 2018-10-24 NOTE — ED Notes (Signed)
1st troponin collected at 1:33 am 2nd troponin due at 3:33 am

## 2018-10-24 NOTE — ED Provider Notes (Addendum)
[facility] EMERGENCY DEPARTMENT Provider Note   CSN: [id number] Arrival date & time: 10/24/18  0020     History   Chief Complaint Chief Complaint  Patient presents with   Chest Pain    HPI Lance [name] is a 48 y.o. male with a hx of CHF, T1DM, HTN, hyperlipidemia, & ESRD on home hemodialysis M/T/Th/F who presents to the ED via EMS for complaints of chest pain which is resolved at present.  Patient states that he was receiving dialysis, he was about three quarters of the way finished, but he states that his blood pressure decreased he became lightheaded/dizzy with chest discomfort.  He describes the chest pain as a pressure to the right chest radiating to the substernal region.  He states this lasted for 15 to 30 minutes prior to spontaneous resolution.  He is chest pain-free currently.  No alleviating or aggravating factors.  He has had similar episodes during dialysis, but has not had chest pain with them.  He denies fever, chills, dyspnea, nausea, vomiting, diaphoresis, or syncope. Denies leg pain/swelling, hemoptysis, recent surgery/trauma, recent long travel, hormone use, personal hx of cancer, or hx of DVT/PE.  He took 324 of aspirin prior to arrival.  He had a positive stress test in the absence of symptoms obtained for pre-kidney transplant evaluation earlier this year, underwent LHC 08/19/18 accessed via right femoral artery and received DES x 2 to his RCA.  Taking all medication as prescribed.      HPI  Past Medical History:  Diagnosis Date   Aortic stenosis    Very mild in 05/2007   Arthritis    "right shoulder; right knee; feel like I've got it all over" (11/28/2015)   CHF (congestive heart failure) (HCC)    Degenerative joint disease    Left TKA   Diastolic heart failure (HCC)    LVEF 65-70% with grade 2 diastolic dysfunction   ESRD (end stage renal disease) on dialysis ([place])    (as of 09/24/2017) pt does HD at home on a M/Tu/Th/F  schedule.   Essential hypertension, benign 2000   LVH   GERD (gastroesophageal reflux disease)    HCAP (healthcare-associated pneumonia) 11/28/2015   Hyperlipidemia 2000   Loculated pleural effusion 11/28/2015   [name] 11/28/2015   Pneumonia 12/2013; 05/2014   PSVT (paroxysmal supraventricular tachycardia) (HCC)    Nonsustained; asymptomatic; diagnosed by event recorder in 2006   Tobacco abuse, in remission    20 pack years; discontinued in 1985; bronchitic changes on chest x-ray and 2009   Type 1 diabetes mellitus ([place]) 1990    Patient Active Problem List   Diagnosis Date Noted   Vitamin D deficiency 06/09/2018   Type 1 diabetes mellitus with other circulatory complications ([place]) [id number]   Cardiac arrest ([place]) 09/24/2017   Acute respiratory failure with hypoxia ([place]) 08/07/2017   Respiratory distress 08/07/2017   Dehiscence of amputation stump ([place]) 07/17/2017   Benign essential HTN    Chronic diastolic (congestive) heart failure (HCC)    Labile blood glucose    Labile blood pressure    Vomiting    Phantom limb pain (HCC)    Poorly controlled type 2 diabetes mellitus with peripheral neuropathy (HCC)    Unilateral complete BKA, right, sequela (HCC)    Amputation of right lower extremity below knee ([place]) 06/10/2017   ESRD on dialysis ([place])    Chronic diastolic congestive heart failure (HCC)    History of PSVT (paroxysmal supraventricular tachycardia)  Diabetes mellitus type 2 in nonobese [facility])    Post-operative pain    Acute blood loss anemia    Anemia of chronic disease    Leukocytosis    Tachycardia    History of right below knee amputation ([place]) 06/06/2017   PVD (peripheral vascular disease) ([place]) 05/13/2017   Bilateral pleural effusion 11/28/2015   Loculated pleural effusion 11/28/2015   HCAP (healthcare-associated pneumonia) 11/28/2015   ESRD (end stage renal disease) on dialysis ([place]) 11/12/2015   Hyperkalemia 11/12/2015    Shoulder pain, acute 11/12/2015   Nausea & vomiting 11/12/2015   Diarrhea 11/12/2015   Hypertensive urgency 05/05/2014   Nephrotic syndrome 12/29/2013   Hyponatremia [id number]   Acute diastolic heart failure ([place]) 12/28/2013   Hypoglycemia 12/28/2013   Dyspnea    Acute renal failure syndrome ([place])    Diabetic ketoacidosis without coma associated with type 1 diabetes mellitus ([place])    Demand ischemia ([place]) 12/24/2013   DKA (diabetic ketoacidoses) ([place]) 12/23/2013   CAP (community acquired pneumonia) 12/23/2013   Sepsis due to pneumonia ([place]) 0000000   Metabolic acidemia 0000000   Acute renal failure ([place]) 12/23/2013   Diabetic ketoacidosis ([place]) 12/23/2013   Chronic diastolic heart failure ([place]) 04/14/2013   PSVT (paroxysmal supraventricular tachycardia) ([place]) 04/14/2013   Bilateral leg edema 03/26/2013   Chronic kidney disease, stage [name] (moderate) ([place]) 09/29/2011   Chest pain 09/26/2010   Type 1 diabetes mellitus with end-stage renal disease ([place]) 09/26/2010   Essential hypertension, benign 09/26/2010   Mixed hyperlipidemia 09/26/2010    Past Surgical History:  Procedure Laterality Date   AMPUTATION Right 06/06/2017   Procedure: RIGHT BELOW KNEE AMPUTATION;  Surgeon: [name], MD;  Location: [facility];  Service: Orthopedics;  Laterality: Right;   APPLICATION OF WOUND VAC Right 07/25/2017   Procedure: APPLICATION OF WOUND VAC;  Surgeon: [name], MD;  Location: [facility];  Service: Orthopedics;  Laterality: Right;   AV FISTULA PLACEMENT Right 06/23/2014   Procedure: ARTERIOVENOUS (AV) FISTULA CREATION;  Surgeon: Angelia Mould, MD;  Location: [facility];  Service: Vascular;  Laterality: Right;   CARDIAC CATHETERIZATION     [facility]- evaluation for Kidney/ pancreas transplant   EYE SURGERY Bilateral    "for glaucoma"   INGUINAL HERNIA REPAIR Right    As a child   IR DIALY SHUNT INTRO [place] W/IMG RIGHT Right  09/29/2017   LOWER EXTREMITY ANGIOGRAPHY N/A 05/27/2017   Procedure: LOWER EXTREMITY ANGIOGRAPHY;  Surgeon: [name], MD;  Location: [facility] CV [facility];  Service: Cardiovascular;  Laterality: N/A;   STUMP REVISION Right 07/25/2017   Procedure: REVISION RIGHT BELOW KNEE AMPUTATION;;  Surgeon: [name], MD;  Location: [facility];  Service: Orthopedics;  Laterality: Right;   [place] Medications    Prior to Admission medications   Medication Sig Start Date End Date Taking? Authorizing Provider  amLODipine (NORVASC) 5 MG tablet Take 5 mg by mouth daily.  08/11/18  Yes [provider]  aspirin EC 325 MG EC tablet Take 1 tablet (325 mg total) by mouth daily. 06/18/17  Yes Angiulli, Lavon Paganini, PA-C  calcitRIOL (ROCALTROL) 0.25 MCG capsule Take 1 capsule (0.25 mcg total) by mouth every Monday, Wednesday, and Friday with hemodialysis. Patient taking differently: Take 0.5 mcg by mouth See admin instructions. Take 0.25 mcg by mouth once a day on Mon/Tues/Thurs/Fri with home dialysis 06/18/17  Yes Angiulli, Lavon Paganini, PA-C  Continuous  Blood Gluc Sensor (FREESTYLE LIBRE 14 DAY SENSOR) MISC 1 each by Does not apply route every 14 (fourteen) days. 07/21/17  Yes Nida, Marella Chimes, MD  Continuous Blood Gluc Sensor (FREESTYLE LIBRE SENSOR SYSTEM) MISC APPLY 1 SENSOR TO THE SKIN EVERY 10 DAYS 11/24/17  Yes Nida, Marella Chimes, MD  docusate sodium (COLACE) 100 MG capsule Take 1 capsule (100 mg total) by mouth 2 (two) times daily. Patient taking differently: Take 200 mg by mouth daily as needed (when taking pain meds).  06/18/17  Yes Angiulli, Lavon Paganini, PA-C  glucose blood (ACCU-CHEK GUIDE) test strip Use as instructed 4 x daily. E11.65 02/19/18  Yes Nida, Marella Chimes, MD  Insulin Glargine (LANTUS SOLOSTAR) 100 UNIT/ML Solostar Pen Inject 34 Units into the skin daily. 05/07/18  Yes Nida, Marella Chimes, MD  insulin lispro (HUMALOG) 100 UNIT/ML KwikPen INJECT 15 TO  21 UNITS INTO THE SKIN THREE TIMES DAILY BEFORE MEALS Patient taking differently: Inject 15-20 Units into the skin 3 (three) times daily.  07/24/18  Yes Nida, Marella Chimes, MD  metoprolol succinate (TOPROL-XL) 25 MG 24 hr tablet Take 25 mg by mouth every evening.  08/11/18  Yes [provider]  nitroGLYCERIN (NITROSTAT) 0.4 MG SL tablet Place 0.4 mg under the tongue every 5 (five) minutes as needed for chest pain.  08/20/18 08/20/19 Yes [provider]  ondansetron (ZOFRAN ODT) 4 MG disintegrating tablet Take 1 tablet (4 mg total) by mouth every 8 (eight) hours as needed for nausea or vomiting. 07/01/17  Yes [name], MD  pantoprazole (PROTONIX) 40 MG tablet TAKE 1 TABLET BY MOUTH EVERY DAY Patient taking differently: Take 40 mg by mouth daily.  09/08/17  Yes [name], MD  pravastatin (PRAVACHOL) 10 MG tablet Take 10 mg by mouth every evening.  09/14/18  Yes [provider]  sucroferric oxyhydroxide (VELPHORO) 500 MG chewable tablet Chew 3 tablets (1,500 mg total) by mouth 3 (three) times daily. 06/18/17  Yes Angiulli, Lavon Paganini, PA-C  ticagrelor (BRILINTA) 90 MG TABS tablet Take 90 mg by mouth 2 (two) times daily.  08/20/18 08/20/19 Yes [provider]  HYDROcodone-acetaminophen (NORCO/VICODIN) 5-325 MG tablet Take one tablet every 4-6 hours pain Patient not taking: Reported on 10/24/2018 10/05/18   [name], MD  valACYclovir (VALTREX) 500 MG tablet Take one tablet by mouth daily for 7 days. On dialysis days, take after dialysis Patient not taking: Reported on 10/24/2018 10/05/18   [name], MD    Family History Family History  Problem Relation Age of Onset   Diabetes Father    Hypertension Father    Diabetes Mother    Hypertension Mother     Social History Social History   Tobacco Use   Smoking status: Never Smoker   Smokeless tobacco: Never Used  Substance Use Topics   Alcohol use: Yes    Comment: 11/28/2015 "used to  drink; stopped ~ 2 yr ago"   Drug use: No     Allergies   Atorvastatin, Minoxidil, Adhesive [tape], and Statins   Review of Systems Review of Systems  Constitutional: Negative for chills, diaphoresis and fever.  Respiratory: Negative for shortness of breath.   Cardiovascular: Positive for chest pain. Negative for leg swelling.  Gastrointestinal: Negative for abdominal pain, nausea and vomiting.  Musculoskeletal: Negative for myalgias.  Neurological: Positive for dizziness and light-headedness. Negative for syncope.  All other systems reviewed and are negative.    Physical Exam Updated Vital Signs BP 104/73  Pulse 94    Temp 98.1 F (36.7 C) (Oral)    Resp 16    Ht 6\' 3"  (1.905 m)    Wt 102.1 kg    SpO2 100%    BMI 28.12 kg/m   Physical Exam Vitals signs and nursing note reviewed.  Constitutional:      General: He is not in acute distress.    Appearance: He is well-developed. He is not toxic-appearing.  HENT:     Head: Normocephalic and atraumatic.  Eyes:     General:        Right eye: No discharge.        Left eye: No discharge.     Conjunctiva/sclera: Conjunctivae normal.  Neck:     Musculoskeletal: Neck supple.  Cardiovascular:     Rate and Rhythm: Normal rate and regular rhythm.  Pulmonary:     Effort: Pulmonary effort is normal. No respiratory distress.     Breath sounds: Normal breath sounds. No wheezing, rhonchi or rales.  Abdominal:     General: There is no distension.     Palpations: Abdomen is soft.     Tenderness: There is no abdominal tenderness.  Musculoskeletal:     Comments: Right upper extremity AV fistula in place with palpable thrill.  Right lower extremity BKA.  No significant lower extremity edema.  Skin:    General: Skin is warm and dry.     Findings: No rash.  Neurological:     Mental Status: He is alert.     Comments: Clear speech.   Psychiatric:        Behavior: Behavior normal.    ED Treatments / Results  Labs (all labs  ordered are listed, but only abnormal results are displayed) Labs Reviewed  BASIC METABOLIC PANEL - Abnormal; Notable for the following components:      Result Value   Sodium 133 (*)    Chloride 94 (*)    Glucose, Bld 167 (*)    BUN 29 (*)    Creatinine, Ser 7.44 (*)    GFR calc non Af Amer 8 (*)    GFR calc Af Amer 9 (*)    All other components within normal limits  CBC - Abnormal; Notable for the following components:   MCV 101.6 (*)    MCH 34.9 (*)    All other components within normal limits  TROPONIN I (HIGH SENSITIVITY) - Abnormal; Notable for the following components:   Troponin I (High Sensitivity) 49 (*)    All other components within normal limits  TROPONIN I (HIGH SENSITIVITY) - Abnormal; Notable for the following components:   Troponin I (High Sensitivity) 45 (*)    All other components within normal limits    EKG None  Radiology Dg Chest 2 View  Result Date: 10/24/2018 CLINICAL DATA:  Chest pain EXAM: CHEST - 2 VIEW COMPARISON:  A 14 19 FINDINGS: Mild cardiomegaly. No focal airspace consolidation or pulmonary edema. No pleural effusion or pneumothorax. IMPRESSION: No active cardiopulmonary disease. Electronically Signed   By: Ulyses Jarred M.D.   On: 10/24/2018 01:32    Procedures Procedures (including critical care time)  Medications Ordered in ED Medications  sodium chloride flush (NS) 0.9 % injection 3 mL (has no administration in time range)     Initial Impression / Assessment and Plan / ED Course  I have reviewed the triage vital signs and the nursing notes.  Pertinent labs & imaging results that were available during my care of the  patient were reviewed by me and considered in my medical decision making (see chart for details).   Patient presents to the emergency department status post episode of chest pain which occurred while he was being dialyzed.  Upon emergency department arrival chest pain has resolved.  He is nontoxic-appearing, no apparent  distress, vitals without significant abnormality.  Exam is benign.  Plan for labs to include troponins, chest x-ray, and EKG.  CBC: No leukocytosis or anemia BMP: Renal function consistent with ESRD on dialysis.  No significant electrolyte derangement.  Hyperglycemia without acidosis or anion gap elevation. Troponin: Initial 49, delta 45-no significant elevation with delta.  EKG: No STEMI, no significant ischemic changes when compared to prior- reviewed w/ Dr. Stark Jock.  CXR: No active cardiopulmonary disease.  Patient did just have recent stent placed during cardiac catheterization after positive screening stress test, however his pain seems very atypical, it resolved prior to ER arrival and has not recurred throughout his emergency department stay, his troponin is elevated some but had no significant change w/ delta, do not suspect ACS. Not tachycardic or hypoxic, low risk [place], doubt PE.  No widened mediastinum, doubt dissection.  Chest x-ray without pneumonia, pneumothorax, or effusion.  Labs without anemia or significant electrolyte derangement.  Overall patient appears appropriate for discharge home he and his wife are in agreement with.  Recommended he call his cardiologist on Monday to discuss his visit and for close follow-up, we discussed very strict return precautions. I discussed results, treatment plan, need for follow-up, and return precautions with the patient & his wife. Provided opportunity for questions, patient & his wife who confirmed understanding and are in agreement with plan.   Findings and plan of care discussed with supervising physician Dr. Stark Jock who is in agreement with care & plan.    Final Clinical Impressions(s) / ED Diagnoses   Final diagnoses:  Chest pain, unspecified type    ED Discharge Orders    None       Amaryllis Dyke, PA-C 10/24/18 0610    Amaryllis Dyke, PA-C 10/24/18 [id number]    Veryl Speak, MD 10/24/18 2316

## 2018-10-26 ENCOUNTER — Encounter: Payer: Self-pay | Admitting: "Endocrinology

## 2018-10-26 ENCOUNTER — Ambulatory Visit (INDEPENDENT_AMBULATORY_CARE_PROVIDER_SITE_OTHER): Payer: Medicare Other | Admitting: "Endocrinology

## 2018-10-26 ENCOUNTER — Other Ambulatory Visit: Payer: Self-pay

## 2018-10-26 DIAGNOSIS — I10 Essential (primary) hypertension: Secondary | ICD-10-CM | POA: Diagnosis not present

## 2018-10-26 DIAGNOSIS — E559 Vitamin D deficiency, unspecified: Secondary | ICD-10-CM

## 2018-10-26 DIAGNOSIS — Z4931 Encounter for adequacy testing for hemodialysis: Secondary | ICD-10-CM | POA: Diagnosis not present

## 2018-10-26 DIAGNOSIS — E440 Moderate protein-calorie malnutrition: Secondary | ICD-10-CM | POA: Diagnosis not present

## 2018-10-26 DIAGNOSIS — N186 End stage renal disease: Secondary | ICD-10-CM

## 2018-10-26 DIAGNOSIS — E782 Mixed hyperlipidemia: Secondary | ICD-10-CM | POA: Diagnosis not present

## 2018-10-26 DIAGNOSIS — E1022 Type 1 diabetes mellitus with diabetic chronic kidney disease: Secondary | ICD-10-CM

## 2018-10-26 DIAGNOSIS — K7689 Other specified diseases of liver: Secondary | ICD-10-CM | POA: Diagnosis not present

## 2018-10-26 DIAGNOSIS — N2581 Secondary hyperparathyroidism of renal origin: Secondary | ICD-10-CM | POA: Diagnosis not present

## 2018-10-26 DIAGNOSIS — Z992 Dependence on renal dialysis: Secondary | ICD-10-CM | POA: Diagnosis not present

## 2018-10-26 MED ORDER — FREESTYLE LIBRE 14 DAY SENSOR MISC: 1.0000 | 2 refills | Status: AC

## 2018-10-26 MED ORDER — FREESTYLE LIBRE 2 READER SYSTM DEVI: 1.0000 | 0 refills | Status: AC

## 2018-10-26 NOTE — Progress Notes (Signed)
10/26/2018, 4:27 PM                                                    Endocrinology Telehealth Visit Follow up Note -During COVID -19 Pandemic  This visit type was conducted due to national recommendations for restrictions regarding the COVID-19 Pandemic  in an effort to limit this patient's exposure and mitigate transmission of the corona virus.  Due to his co-morbid illnesses, Lance [name] is at  moderate to high risk for complications without adequate follow up.  This format is felt to be most appropriate for him at this time.  I connected with this patient on 10/26/2018   by telephone and verified that I am speaking with the correct person using two identifiers. Lance [name], Jun 23, 1970. he has verbally consented to this visit. All issues noted in this document were discussed and addressed. The format was not optimal for physical exam.    Subjective:    Patient ID: Lance [name], male    DOB: 27-Nov-1970.  Lance [name] is being engaged in telehealth in follow-up  for management of currently uncontrolled, complicated, symptomatic type 1 diabetes, hyperlipidemia, hypertension. PMD:   [name], MD.   Past Medical History:  Diagnosis Date  . Aortic stenosis    Very mild in 05/2007  . Arthritis    "right shoulder; right knee; feel like I've got it all over" (11/28/2015)  . CHF (congestive heart failure) ([place])   . Degenerative joint disease    Left TKA  . Diastolic heart failure (HCC)    LVEF 65-70% with grade 2 diastolic dysfunction  . ESRD (end stage renal disease) on dialysis [facility])    (as of 09/24/2017) pt does HD at home on a M/Tu/Th/F schedule.  . Essential hypertension, benign 2000   LVH  . GERD (gastroesophageal reflux disease)   . HCAP (healthcare-associated pneumonia) 11/28/2015  . Hyperlipidemia 2000  . Loculated pleural effusion 11/28/2015   [name] 11/28/2015  . Pneumonia 12/2013; 05/2014  . PSVT  (paroxysmal supraventricular tachycardia) (HCC)    Nonsustained; asymptomatic; diagnosed by event recorder in 2006  . Tobacco abuse, in remission    20 pack years; discontinued in 1985; bronchitic changes on chest x-ray and 2009  . Type 1 diabetes mellitus ([place]) 1990   Past Surgical History:  Procedure Laterality Date  . AMPUTATION Right 06/06/2017   Procedure: RIGHT BELOW KNEE AMPUTATION;  Surgeon: [name], MD;  Location: [place];  Service: Orthopedics;  Laterality: Right;  . APPLICATION OF WOUND VAC Right 07/25/2017   Procedure: APPLICATION OF WOUND VAC;  Surgeon: [name], MD;  Location: [place];  Service: Orthopedics;  Laterality: Right;  . AV FISTULA PLACEMENT Right 06/23/2014   Procedure: ARTERIOVENOUS (AV) FISTULA CREATION;  Surgeon: Angelia Mould, MD;  Location: [place];  Service: Vascular;  Laterality: Right;  . CARDIAC CATHETERIZATION     [facility]- evaluation for Kidney/ pancreas transplant  . EYE SURGERY Bilateral    "for glaucoma"  . INGUINAL HERNIA REPAIR Right    As a child  . IR DIALY SHUNT INTRO NEEDLE/INTRACATH INITIAL W/IMG RIGHT Right 09/29/2017  . LOWER EXTREMITY ANGIOGRAPHY N/A 05/27/2017   Procedure: LOWER EXTREMITY ANGIOGRAPHY;  Surgeon: [name], MD;  Location: [place] [facility];  Service: Cardiovascular;  Laterality: N/A;  . STUMP REVISION Right 07/25/2017   Procedure: REVISION RIGHT BELOW KNEE AMPUTATION;;  Surgeon: [name], MD;  Location: [place];  Service: Orthopedics;  Laterality: Right;  . WISDOM TOOTH EXTRACTION  1987   Social History   Socioeconomic History  . Marital status: Married    Spouse name: Not on file  . Number of children: Not on file  . Years of education: Not on file  . Highest education level: Not on file  Occupational History  . Not on file  Social Needs  . Financial resource strain: Not on file  . Food insecurity    Worry: Not on file    Inability: Not on file  . Transportation needs    Medical: Not on file     Non-medical: Not on file  Tobacco Use  . Smoking status: Never Smoker  . Smokeless tobacco: Never Used  Substance and Sexual Activity  . Alcohol use: Yes    Comment: 11/28/2015 "used to drink; stopped ~ 2 yr ago"  . Drug use: No  . Sexual activity: Yes  Lifestyle  . Physical activity    Days per week: Not on file    Minutes per session: Not on file  . Stress: Not on file  Relationships  . Social Herbalist on phone: Not on file    Gets together: Not on file    Attends religious service: Not on file    Active member of club or organization: Not on file    Attends meetings of clubs or organizations: Not on file    Relationship status: Not on file  Other Topics Concern  . Not on file  Social History Narrative  . Not on file   Outpatient Encounter Medications as of 10/26/2018  Medication Sig  . amLODipine (NORVASC) 5 MG tablet Take 5 mg by mouth daily.   Marland Kitchen aspirin EC 325 MG EC tablet Take 1 tablet (325 mg total) by mouth daily.  . calcitRIOL (ROCALTROL) 0.25 MCG capsule Take 1 capsule (0.25 mcg total) by mouth every Monday, Wednesday, and Friday with hemodialysis. (Patient taking differently: Take 0.5 mcg by mouth See admin instructions. Take 0.25 mcg by mouth once a day on Mon/Tues/Thurs/Fri with home dialysis)  . Continuous Blood Gluc Receiver (FREESTYLE LIBRE 2 READER SYSTM) DEVI 1 Piece by Does not apply route as directed.  . Continuous Blood Gluc Sensor (FREESTYLE LIBRE 14 DAY SENSOR) MISC Inject 1 each into the skin every 14 (fourteen) days. Use as directed.  . docusate sodium (COLACE) 100 MG capsule Take 1 capsule (100 mg total) by mouth 2 (two) times daily. (Patient taking differently: Take 200 mg by mouth daily as needed (when taking pain meds). )  . glucose blood (ACCU-CHEK GUIDE) test strip Use as instructed 4 x daily. E11.65  . HYDROcodone-acetaminophen (NORCO/VICODIN) 5-325 MG tablet Take one tablet every 4-6 hours pain (Patient not taking: Reported on  10/24/2018)  . Insulin Glargine (LANTUS SOLOSTAR) 100 UNIT/ML Solostar Pen Inject 34 Units into the skin daily.  . insulin lispro (HUMALOG) 100 UNIT/ML KwikPen INJECT 15 TO 21 UNITS INTO THE SKIN THREE TIMES DAILY BEFORE MEALS (Patient taking differently: Inject 15-20 Units into the skin 3 (three) times daily. )  . metoprolol succinate (TOPROL-XL) 25 MG 24 hr tablet Take 25 mg by mouth every evening.   . nitroGLYCERIN (NITROSTAT) 0.4 MG SL tablet Place  0.4 mg under the tongue every 5 (five) minutes as needed for chest pain.   Marland Kitchen ondansetron (ZOFRAN ODT) 4 MG disintegrating tablet Take 1 tablet (4 mg total) by mouth every 8 (eight) hours as needed for nausea or vomiting.  . pantoprazole (PROTONIX) 40 MG tablet TAKE 1 TABLET BY MOUTH EVERY DAY (Patient taking differently: Take 40 mg by mouth daily. )  . pravastatin (PRAVACHOL) 10 MG tablet Take 10 mg by mouth every evening.   . sucroferric oxyhydroxide (VELPHORO) 500 MG chewable tablet Chew 3 tablets (1,500 mg total) by mouth 3 (three) times daily.  . ticagrelor (BRILINTA) 90 MG TABS tablet Take 90 mg by mouth 2 (two) times daily.   . valACYclovir (VALTREX) 500 MG tablet Take one tablet by mouth daily for 7 days. On dialysis days, take after dialysis (Patient not taking: Reported on 10/24/2018)  . [DISCONTINUED] Continuous Blood Gluc Sensor (FREESTYLE LIBRE 14 DAY SENSOR) MISC 1 each by Does not apply route every 14 (fourteen) days.  . [DISCONTINUED] Continuous Blood Gluc Sensor (FREESTYLE LIBRE SENSOR SYSTEM) MISC APPLY 1 SENSOR TO THE SKIN EVERY 10 DAYS   No facility-administered encounter medications on file as of 10/26/2018.     ALLERGIES: Allergies  Allergen Reactions  . Atorvastatin Other (See Comments)    Muscle pain   . Minoxidil Other (See Comments)    Fluid retention   . Adhesive [Tape] Other (See Comments)    MUST USE "SILK TAPE," AS ANY OTHER "BURNS" THE SKIN  . Statins Other (See Comments)    Muscle pain    VACCINATION  STATUS: Immunization History  Administered Date(s) Administered  . Influenza,inj,Quad PF,6+ Mos 05/06/2014  . Pneumococcal Polysaccharide-23 05/06/2014    Diabetes He presents for his follow-up diabetic visit. He has type 1 diabetes mellitus. Onset time: He was diagnosed at approximate age of 34 years with type 1 diabetes. His disease course has been fluctuating (Since his last visit, he was hospitalized for cardiac arrest which reportedly happened because of hyperkalemia from defective dialysis machine.). There are no hypoglycemic associated symptoms. Pertinent negatives for hypoglycemia include no confusion, headaches, pallor or seizures. Pertinent negatives for diabetes include no chest pain, no fatigue, no polydipsia, no polyphagia, no polyuria and no weakness. There are no hypoglycemic complications. Symptoms are improving. Diabetic complications include nephropathy and PVD. (His diabetes is complicated by end-stage renal disease on  hemodialysis for the last 5 years.  Since last visit, he underwent right below-knee amputation due to peripheral arterial disease.  Patient is  on  Kidney transplant list.) Risk factors for coronary artery disease include diabetes mellitus, hypertension and sedentary lifestyle. Current diabetic treatment includes insulin injections. Weight trend: Status post right BKA. He is following a generally unhealthy diet. He has had a previous visit with a dietitian. He never participates in exercise. His home blood glucose trend is decreasing steadily. His breakfast blood glucose range is generally 180-200 mg/dl. His lunch blood glucose range is generally 180-200 mg/dl. His dinner blood glucose range is generally 180-200 mg/dl. His bedtime blood glucose range is generally 180-200 mg/dl. His overall blood glucose range is 180-200 mg/dl. (Reported glycemic profile: Fasting 86-252, prelunch 60-192, presupper between 150-180, bedtime between 86-300.  His previsit labs show A1c of 8.6%   increasing from 8.2% during his last visit. ) An ACE inhibitor/angiotensin II receptor blocker is being taken. Eye exam is current.  Hypertension This is a chronic problem. The current episode started more than 1 year ago. The problem is controlled.  Pertinent negatives include no chest pain, headaches, neck pain, palpitations or shortness of breath. Risk factors for coronary artery disease include diabetes mellitus and sedentary lifestyle. Past treatments include ACE inhibitors. Hypertensive end-organ damage includes kidney disease and PVD.   Review of system: Limited as above.    Objective:    There were no vitals taken for this visit.  Wt Readings from Last 3 Encounters:  10/24/18 225 lb (102.1 kg)  09/15/18 233 lb (105.7 kg)  08/27/18 234 lb (106.1 kg)    CMP ( most recent) CMP     Component Value Date/Time   NA 133 (L) 10/24/2018 0133   NA 135 06/03/2018 1128   K 4.0 10/24/2018 0133   CL 94 (L) 10/24/2018 0133   CO2 24 10/24/2018 0133   GLUCOSE 167 (H) 10/24/2018 0133   BUN 29 (H) 10/24/2018 0133   BUN 35 (H) 06/03/2018 1128   CREATININE 7.44 (H) 10/24/2018 0133   CREATININE 7.99 (H) 10/14/2018 1346   CALCIUM 9.0 10/24/2018 0133   PROT 7.9 10/14/2018 1346   PROT 8.0 06/03/2018 1128   ALBUMIN 4.4 06/03/2018 1128   AST 11 10/14/2018 1346   ALT 15 10/14/2018 1346   ALKPHOS 128 (H) 06/03/2018 1128   BILITOT 0.7 10/14/2018 1346   BILITOT 0.4 06/03/2018 1128   GFRNONAA 8 (L) 10/24/2018 0133   GFRNONAA 7 (L) 10/14/2018 1346   GFRAA 9 (L) 10/24/2018 0133   GFRAA 8 (L) 10/14/2018 1346     Diabetic Labs (most recent): Lab Results  Component Value Date   HGBA1C 8.6 (H) 10/14/2018   HGBA1C 8.2 (H) 06/03/2018   HGBA1C 10.7 (H) 01/19/2018     Lipid Panel ( most recent) Lipid Panel     Component Value Date/Time   CHOL 220 (H) 06/03/2018 1128   TRIG 134 06/03/2018 1128   HDL 53 06/03/2018 1128   CHOLHDL 4.2 06/03/2018 1128   CHOLHDL 4.5 12/28/2013 0707   VLDL 46  (H) 12/28/2013 0707   LDLCALC 140 (H) 06/03/2018 1128      Lab Results  Component Value Date   TSH 0.697 01/19/2018   TSH 1.390 02/18/2017   TSH 1.760 12/26/2013   FREET4 0.98 01/19/2018   FREET4 1.00 02/18/2017     Assessment & Plan:   1. Type 1 diabetes mellitus with end-stage renal disease ([place])  - Lance [name] has currently uncontrolled symptomatic type 1 DM since  48 years of age.  -His diabetes is complicated by peripheral arterial disease status post right BKA, ESRD currently on hemodialysis, recent hospitalization for cardiac arrest due to hyperkalemia which resulted from defective dialysis machine.   -He reports fluctuating blood glucose profile, however overall improving.  His previsit A1c is increasing to 8.6% from 8.2%.     Lance [name] remains at a high risk for more acute and chronic complications which include CAD, CVA, retinopathy, and neuropathy. These are all discussed in detail with the patient.  - I have counseled him on diet management  by adopting a carbohydrate restricted/protein rich diet.  - he  admits there is a room for improvement in his diet and drink choices. -  Suggestion is made for him to avoid simple carbohydrates  from his diet including Cakes, Sweet Desserts / Pastries, Ice Cream, Soda (diet and regular), Sweet Tea, Candies, Chips, Cookies, Sweet Pastries,  Store Bought Juices, Alcohol in Excess of  1-2 drinks a day, Artificial Sweeteners, Coffee Creamer, and "Sugar-free" Products. This will help  patient to have stable blood glucose profile and potentially avoid unintended weight gain.  - I encouraged him to switch to  unprocessed or minimally processed complex starch and increased protein intake (animal or plant source), fruits, and vegetables.  - he is advised to stick to a routine mealtimes to eat 3 meals  a day and avoid unnecessary snacks ( to snack only to correct hypoglycemia).   - he has been scheduled with Jearld Fenton, RDN, CDE for individualized diabetes education- consult pending.  - I have approached him with the following individualized plan to manage diabetes and patient agrees:    He is approached to stay engaged, he is Freestyle libre CGM device today with switched to freestyle libre 2 reader/sensor system.   -He has benefited from his CGM device significantly, and advised to continue to wear it at all times. -He is advised to increase Lantus to 36 units  nightly, continue Humalog 15 units 3 times a day with meals for pre-meal BG readings of 70-150mg /dl, plus patient specific correction dose for unexpected hyperglycemia above 150mg /dl, associated with strict documenting of  glucose 4 times a day-before meals and at bedtime. - Patient is warned not to take insulin without proper monitoring per orders. -Adjustment parameters are given for hypo and hyperglycemia in writing. -Patient is encouraged to call clinic for blood glucose levels less than 70 or above 300 mg /dl.  - Insulin is the only choice of therapy for him. He is benefiting from CGM, I advised him to wear it at all times.   - Patient specific target  A1c;  LDL, HDL, Triglycerides, and  Waist Circumference were discussed in detail.  2) BP/HTN: he is advised to home monitor blood pressure and report if > 140/90 on 2 separate readings.  He is advised to continue his current blood pressure medications including Coreg 3.125 mg p.o. twice daily, lisinopril 40 mg p.o. Daily.   3) Lipids/HPL: Recent  Lipid panel   shows LDL at 89. He reporting significant adverse reactions to statins.  4)  Weight/Diet: CDE Consult has been  initiated , exercise, and detailed carbohydrates information provided.  5) Chronic Care/Health Maintenance:  -he  is on ACEI medications and  is encouraged to continue to follow up with Ophthalmology, Dentist, nephrology,  Podiatrist at least yearly or according to recommendations, and advised to  stay away from  smoking. I have recommended yearly flu vaccine and pneumonia vaccination at least every 5 years; moderate intensity exercise for up to 150 minutes weekly; and  sleep for at least 7 hours a day.  - I advised patient to maintain close follow up with [name], MD for primary care needs.  - Patient Care Time Today:  25 min, of which >50% was spent in  counseling and the rest reviewing his  current and  previous labs/studies, previous treatments, his blood glucose readings, and medications' doses and developing a plan for long-term care based on the latest recommendations for standards of care.   Lance [name] participated in the discussions, expressed understanding, and voiced agreement with the above plans.  All questions were answered to his satisfaction. he is encouraged to contact clinic should he have any questions or concerns prior to his return visit.   Follow up plan: - Return in about 4 months (around 02/25/2019) for Bring Meter and Logs- A1c in Office, Include 8 log sheets.  Glade Lloyd, MD [facility] [address]  [place], [place] [address] Phone: [phone]  Fax: [phone]    10/26/2018, 4:27 PM  This note was partially dictated with voice recognition software. Similar sounding words can be transcribed inadequately or may not  be corrected upon review.

## 2018-10-27 ENCOUNTER — Telehealth (HOSPITAL_COMMUNITY): Payer: Self-pay | Admitting: *Deleted

## 2018-10-27 DIAGNOSIS — N186 End stage renal disease: Secondary | ICD-10-CM | POA: Diagnosis not present

## 2018-10-27 DIAGNOSIS — E440 Moderate protein-calorie malnutrition: Secondary | ICD-10-CM | POA: Diagnosis not present

## 2018-10-27 DIAGNOSIS — K7689 Other specified diseases of liver: Secondary | ICD-10-CM | POA: Diagnosis not present

## 2018-10-27 DIAGNOSIS — Z992 Dependence on renal dialysis: Secondary | ICD-10-CM | POA: Diagnosis not present

## 2018-10-27 DIAGNOSIS — N2581 Secondary hyperparathyroidism of renal origin: Secondary | ICD-10-CM | POA: Diagnosis not present

## 2018-10-27 DIAGNOSIS — Z4931 Encounter for adequacy testing for hemodialysis: Secondary | ICD-10-CM | POA: Diagnosis not present

## 2018-10-27 NOTE — Telephone Encounter (Addendum)
Called and left message for pt regarding referral for Cardiac Rehab.  Would like to clarify plans for revision of Right arm fistula.  Pt seen in follow up with Dr. Sherren Mocha - 8/4 and any follow up from his ER visit for cp on 9/12. Pt next appt scheduled for cardiology is 02/25/19 at [facility] phone number provided. Cherre Huger, BSN Cardiac and Training and development officer

## 2018-10-29 DIAGNOSIS — N186 End stage renal disease: Secondary | ICD-10-CM | POA: Diagnosis not present

## 2018-10-29 DIAGNOSIS — K7689 Other specified diseases of liver: Secondary | ICD-10-CM | POA: Diagnosis not present

## 2018-10-29 DIAGNOSIS — Z992 Dependence on renal dialysis: Secondary | ICD-10-CM | POA: Diagnosis not present

## 2018-10-29 DIAGNOSIS — N2581 Secondary hyperparathyroidism of renal origin: Secondary | ICD-10-CM | POA: Diagnosis not present

## 2018-10-29 DIAGNOSIS — Z4931 Encounter for adequacy testing for hemodialysis: Secondary | ICD-10-CM | POA: Diagnosis not present

## 2018-10-29 DIAGNOSIS — E440 Moderate protein-calorie malnutrition: Secondary | ICD-10-CM | POA: Diagnosis not present

## 2018-10-30 DIAGNOSIS — Z992 Dependence on renal dialysis: Secondary | ICD-10-CM | POA: Diagnosis not present

## 2018-10-30 DIAGNOSIS — E440 Moderate protein-calorie malnutrition: Secondary | ICD-10-CM | POA: Diagnosis not present

## 2018-10-30 DIAGNOSIS — K7689 Other specified diseases of liver: Secondary | ICD-10-CM | POA: Diagnosis not present

## 2018-10-30 DIAGNOSIS — N186 End stage renal disease: Secondary | ICD-10-CM | POA: Diagnosis not present

## 2018-10-30 DIAGNOSIS — N2581 Secondary hyperparathyroidism of renal origin: Secondary | ICD-10-CM | POA: Diagnosis not present

## 2018-10-30 DIAGNOSIS — Z4931 Encounter for adequacy testing for hemodialysis: Secondary | ICD-10-CM | POA: Diagnosis not present

## 2018-11-02 DIAGNOSIS — Z992 Dependence on renal dialysis: Secondary | ICD-10-CM | POA: Diagnosis not present

## 2018-11-02 DIAGNOSIS — Z4931 Encounter for adequacy testing for hemodialysis: Secondary | ICD-10-CM | POA: Diagnosis not present

## 2018-11-02 DIAGNOSIS — K7689 Other specified diseases of liver: Secondary | ICD-10-CM | POA: Diagnosis not present

## 2018-11-02 DIAGNOSIS — N2581 Secondary hyperparathyroidism of renal origin: Secondary | ICD-10-CM | POA: Diagnosis not present

## 2018-11-02 DIAGNOSIS — E440 Moderate protein-calorie malnutrition: Secondary | ICD-10-CM | POA: Diagnosis not present

## 2018-11-02 DIAGNOSIS — N186 End stage renal disease: Secondary | ICD-10-CM | POA: Diagnosis not present

## 2018-11-03 DIAGNOSIS — N186 End stage renal disease: Secondary | ICD-10-CM | POA: Diagnosis not present

## 2018-11-03 DIAGNOSIS — Z992 Dependence on renal dialysis: Secondary | ICD-10-CM | POA: Diagnosis not present

## 2018-11-03 DIAGNOSIS — Z4931 Encounter for adequacy testing for hemodialysis: Secondary | ICD-10-CM | POA: Diagnosis not present

## 2018-11-03 DIAGNOSIS — E440 Moderate protein-calorie malnutrition: Secondary | ICD-10-CM | POA: Diagnosis not present

## 2018-11-03 DIAGNOSIS — N2581 Secondary hyperparathyroidism of renal origin: Secondary | ICD-10-CM | POA: Diagnosis not present

## 2018-11-03 DIAGNOSIS — K7689 Other specified diseases of liver: Secondary | ICD-10-CM | POA: Diagnosis not present

## 2018-11-05 DIAGNOSIS — N186 End stage renal disease: Secondary | ICD-10-CM | POA: Diagnosis not present

## 2018-11-05 DIAGNOSIS — Z4931 Encounter for adequacy testing for hemodialysis: Secondary | ICD-10-CM | POA: Diagnosis not present

## 2018-11-05 DIAGNOSIS — Z992 Dependence on renal dialysis: Secondary | ICD-10-CM | POA: Diagnosis not present

## 2018-11-05 DIAGNOSIS — K7689 Other specified diseases of liver: Secondary | ICD-10-CM | POA: Diagnosis not present

## 2018-11-05 DIAGNOSIS — E440 Moderate protein-calorie malnutrition: Secondary | ICD-10-CM | POA: Diagnosis not present

## 2018-11-05 DIAGNOSIS — N2581 Secondary hyperparathyroidism of renal origin: Secondary | ICD-10-CM | POA: Diagnosis not present

## 2018-11-06 DIAGNOSIS — N186 End stage renal disease: Secondary | ICD-10-CM | POA: Diagnosis not present

## 2018-11-06 DIAGNOSIS — E440 Moderate protein-calorie malnutrition: Secondary | ICD-10-CM | POA: Diagnosis not present

## 2018-11-06 DIAGNOSIS — N2581 Secondary hyperparathyroidism of renal origin: Secondary | ICD-10-CM | POA: Diagnosis not present

## 2018-11-06 DIAGNOSIS — K7689 Other specified diseases of liver: Secondary | ICD-10-CM | POA: Diagnosis not present

## 2018-11-06 DIAGNOSIS — Z992 Dependence on renal dialysis: Secondary | ICD-10-CM | POA: Diagnosis not present

## 2018-11-06 DIAGNOSIS — Z4931 Encounter for adequacy testing for hemodialysis: Secondary | ICD-10-CM | POA: Diagnosis not present

## 2018-11-09 DIAGNOSIS — Z4931 Encounter for adequacy testing for hemodialysis: Secondary | ICD-10-CM | POA: Diagnosis not present

## 2018-11-09 DIAGNOSIS — Z992 Dependence on renal dialysis: Secondary | ICD-10-CM | POA: Diagnosis not present

## 2018-11-09 DIAGNOSIS — K7689 Other specified diseases of liver: Secondary | ICD-10-CM | POA: Diagnosis not present

## 2018-11-09 DIAGNOSIS — E440 Moderate protein-calorie malnutrition: Secondary | ICD-10-CM | POA: Diagnosis not present

## 2018-11-09 DIAGNOSIS — N2581 Secondary hyperparathyroidism of renal origin: Secondary | ICD-10-CM | POA: Diagnosis not present

## 2018-11-09 DIAGNOSIS — N186 End stage renal disease: Secondary | ICD-10-CM | POA: Diagnosis not present

## 2018-11-10 DIAGNOSIS — Z992 Dependence on renal dialysis: Secondary | ICD-10-CM | POA: Diagnosis not present

## 2018-11-10 DIAGNOSIS — K7689 Other specified diseases of liver: Secondary | ICD-10-CM | POA: Diagnosis not present

## 2018-11-10 DIAGNOSIS — Z4931 Encounter for adequacy testing for hemodialysis: Secondary | ICD-10-CM | POA: Diagnosis not present

## 2018-11-10 DIAGNOSIS — N2581 Secondary hyperparathyroidism of renal origin: Secondary | ICD-10-CM | POA: Diagnosis not present

## 2018-11-10 DIAGNOSIS — N186 End stage renal disease: Secondary | ICD-10-CM | POA: Diagnosis not present

## 2018-11-10 DIAGNOSIS — E440 Moderate protein-calorie malnutrition: Secondary | ICD-10-CM | POA: Diagnosis not present

## 2018-11-12 DIAGNOSIS — N2589 Other disorders resulting from impaired renal tubular function: Secondary | ICD-10-CM | POA: Diagnosis not present

## 2018-11-12 DIAGNOSIS — R509 Fever, unspecified: Secondary | ICD-10-CM | POA: Diagnosis not present

## 2018-11-12 DIAGNOSIS — E1022 Type 1 diabetes mellitus with diabetic chronic kidney disease: Secondary | ICD-10-CM | POA: Diagnosis not present

## 2018-11-12 DIAGNOSIS — K7689 Other specified diseases of liver: Secondary | ICD-10-CM | POA: Diagnosis not present

## 2018-11-12 DIAGNOSIS — E8779 Other fluid overload: Secondary | ICD-10-CM | POA: Diagnosis not present

## 2018-11-12 DIAGNOSIS — N186 End stage renal disease: Secondary | ICD-10-CM | POA: Diagnosis not present

## 2018-11-12 DIAGNOSIS — N2581 Secondary hyperparathyroidism of renal origin: Secondary | ICD-10-CM | POA: Diagnosis not present

## 2018-11-12 DIAGNOSIS — Z4931 Encounter for adequacy testing for hemodialysis: Secondary | ICD-10-CM | POA: Diagnosis not present

## 2018-11-12 DIAGNOSIS — Z992 Dependence on renal dialysis: Secondary | ICD-10-CM | POA: Diagnosis not present

## 2018-11-12 DIAGNOSIS — D631 Anemia in chronic kidney disease: Secondary | ICD-10-CM | POA: Diagnosis not present

## 2018-11-12 DIAGNOSIS — Z23 Encounter for immunization: Secondary | ICD-10-CM | POA: Diagnosis not present

## 2018-11-12 DIAGNOSIS — I120 Hypertensive chronic kidney disease with stage 5 chronic kidney disease or end stage renal disease: Secondary | ICD-10-CM | POA: Diagnosis not present

## 2018-11-13 DIAGNOSIS — Z4931 Encounter for adequacy testing for hemodialysis: Secondary | ICD-10-CM | POA: Diagnosis not present

## 2018-11-13 DIAGNOSIS — D631 Anemia in chronic kidney disease: Secondary | ICD-10-CM | POA: Diagnosis not present

## 2018-11-13 DIAGNOSIS — K7689 Other specified diseases of liver: Secondary | ICD-10-CM | POA: Diagnosis not present

## 2018-11-13 DIAGNOSIS — N186 End stage renal disease: Secondary | ICD-10-CM | POA: Diagnosis not present

## 2018-11-13 DIAGNOSIS — Z992 Dependence on renal dialysis: Secondary | ICD-10-CM | POA: Diagnosis not present

## 2018-11-13 DIAGNOSIS — N2581 Secondary hyperparathyroidism of renal origin: Secondary | ICD-10-CM | POA: Diagnosis not present

## 2018-11-16 DIAGNOSIS — E7849 Other hyperlipidemia: Secondary | ICD-10-CM | POA: Diagnosis not present

## 2018-11-16 DIAGNOSIS — Z992 Dependence on renal dialysis: Secondary | ICD-10-CM | POA: Diagnosis not present

## 2018-11-16 DIAGNOSIS — Z4931 Encounter for adequacy testing for hemodialysis: Secondary | ICD-10-CM | POA: Diagnosis not present

## 2018-11-16 DIAGNOSIS — N186 End stage renal disease: Secondary | ICD-10-CM | POA: Diagnosis not present

## 2018-11-16 DIAGNOSIS — N2581 Secondary hyperparathyroidism of renal origin: Secondary | ICD-10-CM | POA: Diagnosis not present

## 2018-11-16 DIAGNOSIS — K7689 Other specified diseases of liver: Secondary | ICD-10-CM | POA: Diagnosis not present

## 2018-11-16 DIAGNOSIS — D631 Anemia in chronic kidney disease: Secondary | ICD-10-CM | POA: Diagnosis not present

## 2018-11-17 DIAGNOSIS — N2581 Secondary hyperparathyroidism of renal origin: Secondary | ICD-10-CM | POA: Diagnosis not present

## 2018-11-17 DIAGNOSIS — D631 Anemia in chronic kidney disease: Secondary | ICD-10-CM | POA: Diagnosis not present

## 2018-11-17 DIAGNOSIS — N186 End stage renal disease: Secondary | ICD-10-CM | POA: Diagnosis not present

## 2018-11-17 DIAGNOSIS — K7689 Other specified diseases of liver: Secondary | ICD-10-CM | POA: Diagnosis not present

## 2018-11-17 DIAGNOSIS — Z4931 Encounter for adequacy testing for hemodialysis: Secondary | ICD-10-CM | POA: Diagnosis not present

## 2018-11-17 DIAGNOSIS — Z992 Dependence on renal dialysis: Secondary | ICD-10-CM | POA: Diagnosis not present

## 2018-11-19 DIAGNOSIS — Z4931 Encounter for adequacy testing for hemodialysis: Secondary | ICD-10-CM | POA: Diagnosis not present

## 2018-11-19 DIAGNOSIS — N186 End stage renal disease: Secondary | ICD-10-CM | POA: Diagnosis not present

## 2018-11-19 DIAGNOSIS — D631 Anemia in chronic kidney disease: Secondary | ICD-10-CM | POA: Diagnosis not present

## 2018-11-19 DIAGNOSIS — N2581 Secondary hyperparathyroidism of renal origin: Secondary | ICD-10-CM | POA: Diagnosis not present

## 2018-11-19 DIAGNOSIS — K7689 Other specified diseases of liver: Secondary | ICD-10-CM | POA: Diagnosis not present

## 2018-11-19 DIAGNOSIS — Z992 Dependence on renal dialysis: Secondary | ICD-10-CM | POA: Diagnosis not present

## 2018-11-20 ENCOUNTER — Telehealth (HOSPITAL_COMMUNITY): Payer: Self-pay

## 2018-11-20 DIAGNOSIS — N186 End stage renal disease: Secondary | ICD-10-CM | POA: Diagnosis not present

## 2018-11-20 DIAGNOSIS — Z4931 Encounter for adequacy testing for hemodialysis: Secondary | ICD-10-CM | POA: Diagnosis not present

## 2018-11-20 DIAGNOSIS — Z992 Dependence on renal dialysis: Secondary | ICD-10-CM | POA: Diagnosis not present

## 2018-11-20 DIAGNOSIS — N2581 Secondary hyperparathyroidism of renal origin: Secondary | ICD-10-CM | POA: Diagnosis not present

## 2018-11-20 DIAGNOSIS — K7689 Other specified diseases of liver: Secondary | ICD-10-CM | POA: Diagnosis not present

## 2018-11-20 DIAGNOSIS — D631 Anemia in chronic kidney disease: Secondary | ICD-10-CM | POA: Diagnosis not present

## 2018-11-20 NOTE — Telephone Encounter (Signed)
No response from pt regarding CR.  Closed referral.  

## 2018-11-23 DIAGNOSIS — K7689 Other specified diseases of liver: Secondary | ICD-10-CM | POA: Diagnosis not present

## 2018-11-23 DIAGNOSIS — D631 Anemia in chronic kidney disease: Secondary | ICD-10-CM | POA: Diagnosis not present

## 2018-11-23 DIAGNOSIS — Z7901 Long term (current) use of anticoagulants: Secondary | ICD-10-CM | POA: Diagnosis not present

## 2018-11-23 DIAGNOSIS — N2581 Secondary hyperparathyroidism of renal origin: Secondary | ICD-10-CM | POA: Diagnosis not present

## 2018-11-23 DIAGNOSIS — Z4931 Encounter for adequacy testing for hemodialysis: Secondary | ICD-10-CM | POA: Diagnosis not present

## 2018-11-23 DIAGNOSIS — N186 End stage renal disease: Secondary | ICD-10-CM | POA: Diagnosis not present

## 2018-11-23 DIAGNOSIS — I2510 Atherosclerotic heart disease of native coronary artery without angina pectoris: Secondary | ICD-10-CM | POA: Diagnosis not present

## 2018-11-23 DIAGNOSIS — Z992 Dependence on renal dialysis: Secondary | ICD-10-CM | POA: Diagnosis not present

## 2018-11-24 DIAGNOSIS — N186 End stage renal disease: Secondary | ICD-10-CM | POA: Diagnosis not present

## 2018-11-24 DIAGNOSIS — Z992 Dependence on renal dialysis: Secondary | ICD-10-CM | POA: Diagnosis not present

## 2018-11-24 DIAGNOSIS — K7689 Other specified diseases of liver: Secondary | ICD-10-CM | POA: Diagnosis not present

## 2018-11-24 DIAGNOSIS — Z4931 Encounter for adequacy testing for hemodialysis: Secondary | ICD-10-CM | POA: Diagnosis not present

## 2018-11-24 DIAGNOSIS — D631 Anemia in chronic kidney disease: Secondary | ICD-10-CM | POA: Diagnosis not present

## 2018-11-24 DIAGNOSIS — N2581 Secondary hyperparathyroidism of renal origin: Secondary | ICD-10-CM | POA: Diagnosis not present

## 2018-11-26 ENCOUNTER — Other Ambulatory Visit: Payer: Self-pay | Admitting: Surgery

## 2018-11-26 DIAGNOSIS — D631 Anemia in chronic kidney disease: Secondary | ICD-10-CM | POA: Diagnosis not present

## 2018-11-26 DIAGNOSIS — N2581 Secondary hyperparathyroidism of renal origin: Secondary | ICD-10-CM | POA: Diagnosis not present

## 2018-11-26 DIAGNOSIS — K7689 Other specified diseases of liver: Secondary | ICD-10-CM | POA: Diagnosis not present

## 2018-11-26 DIAGNOSIS — N186 End stage renal disease: Secondary | ICD-10-CM | POA: Diagnosis not present

## 2018-11-26 DIAGNOSIS — Z4931 Encounter for adequacy testing for hemodialysis: Secondary | ICD-10-CM | POA: Diagnosis not present

## 2018-11-26 DIAGNOSIS — Z992 Dependence on renal dialysis: Secondary | ICD-10-CM | POA: Diagnosis not present

## 2018-11-27 DIAGNOSIS — Z992 Dependence on renal dialysis: Secondary | ICD-10-CM | POA: Diagnosis not present

## 2018-11-27 DIAGNOSIS — Z4931 Encounter for adequacy testing for hemodialysis: Secondary | ICD-10-CM | POA: Diagnosis not present

## 2018-11-27 DIAGNOSIS — K7689 Other specified diseases of liver: Secondary | ICD-10-CM | POA: Diagnosis not present

## 2018-11-27 DIAGNOSIS — N2581 Secondary hyperparathyroidism of renal origin: Secondary | ICD-10-CM | POA: Diagnosis not present

## 2018-11-27 DIAGNOSIS — N186 End stage renal disease: Secondary | ICD-10-CM | POA: Diagnosis not present

## 2018-11-27 DIAGNOSIS — D631 Anemia in chronic kidney disease: Secondary | ICD-10-CM | POA: Diagnosis not present

## 2018-11-30 DIAGNOSIS — D631 Anemia in chronic kidney disease: Secondary | ICD-10-CM | POA: Diagnosis not present

## 2018-11-30 DIAGNOSIS — N186 End stage renal disease: Secondary | ICD-10-CM | POA: Diagnosis not present

## 2018-11-30 DIAGNOSIS — N2581 Secondary hyperparathyroidism of renal origin: Secondary | ICD-10-CM | POA: Diagnosis not present

## 2018-11-30 DIAGNOSIS — Z4931 Encounter for adequacy testing for hemodialysis: Secondary | ICD-10-CM | POA: Diagnosis not present

## 2018-11-30 DIAGNOSIS — Z992 Dependence on renal dialysis: Secondary | ICD-10-CM | POA: Diagnosis not present

## 2018-11-30 DIAGNOSIS — K7689 Other specified diseases of liver: Secondary | ICD-10-CM | POA: Diagnosis not present

## 2018-12-01 ENCOUNTER — Encounter (HOSPITAL_COMMUNITY): Payer: Self-pay | Admitting: *Deleted

## 2018-12-01 ENCOUNTER — Emergency Department (HOSPITAL_COMMUNITY): Payer: BC Managed Care – PPO

## 2018-12-01 ENCOUNTER — Emergency Department (HOSPITAL_COMMUNITY)
Admission: EM | Admit: 2018-12-01 | Discharge: 2018-12-01 | Disposition: A | Discharge status: Home / Self Care | Payer: BC Managed Care – PPO | Attending: Emergency Medicine | Admitting: Emergency Medicine

## 2018-12-01 ENCOUNTER — Other Ambulatory Visit: Payer: Self-pay

## 2018-12-01 DIAGNOSIS — I132 Hypertensive heart and chronic kidney disease with heart failure and with stage 5 chronic kidney disease, or end stage renal disease: Secondary | ICD-10-CM | POA: Insufficient documentation

## 2018-12-01 DIAGNOSIS — Z20828 Contact with and (suspected) exposure to other viral communicable diseases: Secondary | ICD-10-CM | POA: Insufficient documentation

## 2018-12-01 DIAGNOSIS — K7689 Other specified diseases of liver: Secondary | ICD-10-CM | POA: Diagnosis not present

## 2018-12-01 DIAGNOSIS — N186 End stage renal disease: Secondary | ICD-10-CM | POA: Diagnosis not present

## 2018-12-01 DIAGNOSIS — I5032 Chronic diastolic (congestive) heart failure: Secondary | ICD-10-CM | POA: Insufficient documentation

## 2018-12-01 DIAGNOSIS — Z7982 Long term (current) use of aspirin: Secondary | ICD-10-CM | POA: Insufficient documentation

## 2018-12-01 DIAGNOSIS — I120 Hypertensive chronic kidney disease with stage 5 chronic kidney disease or end stage renal disease: Secondary | ICD-10-CM | POA: Diagnosis not present

## 2018-12-01 DIAGNOSIS — Z79899 Other long term (current) drug therapy: Secondary | ICD-10-CM | POA: Insufficient documentation

## 2018-12-01 DIAGNOSIS — D631 Anemia in chronic kidney disease: Secondary | ICD-10-CM | POA: Diagnosis not present

## 2018-12-01 DIAGNOSIS — Z4931 Encounter for adequacy testing for hemodialysis: Secondary | ICD-10-CM | POA: Diagnosis not present

## 2018-12-01 DIAGNOSIS — R0602 Shortness of breath: Secondary | ICD-10-CM | POA: Diagnosis not present

## 2018-12-01 DIAGNOSIS — E1065 Type 1 diabetes mellitus with hyperglycemia: Secondary | ICD-10-CM | POA: Diagnosis not present

## 2018-12-01 DIAGNOSIS — Z992 Dependence on renal dialysis: Secondary | ICD-10-CM | POA: Insufficient documentation

## 2018-12-01 DIAGNOSIS — N2581 Secondary hyperparathyroidism of renal origin: Secondary | ICD-10-CM | POA: Diagnosis not present

## 2018-12-01 DIAGNOSIS — R739 Hyperglycemia, unspecified: Secondary | ICD-10-CM

## 2018-12-01 LAB — CBC WITH DIFFERENTIAL/PLATELET
Abs Immature Granulocytes: 0.05 10*3/uL (ref 0.00–0.07)
Basophils Absolute: 0.1 10*3/uL (ref 0.0–0.1)
Basophils Relative: 1 %
Eosinophils Absolute: 0.1 10*3/uL (ref 0.0–0.5)
Eosinophils Relative: 1 %
HCT: 35.6 % — ABNORMAL LOW (ref 39.0–52.0)
Hemoglobin: 11.1 g/dL — ABNORMAL LOW (ref 13.0–17.0)
Immature Granulocytes: 1 %
Lymphocytes Relative: 15 %
Lymphs Abs: 1.6 10*3/uL (ref 0.7–4.0)
MCH: 32.4 pg (ref 26.0–34.0)
MCHC: 31.2 g/dL (ref 30.0–36.0)
MCV: 103.8 fL — ABNORMAL HIGH (ref 80.0–100.0)
Monocytes Absolute: 0.9 10*3/uL (ref 0.1–1.0)
Monocytes Relative: 9 %
Neutro Abs: 7.6 10*3/uL (ref 1.7–7.7)
Neutrophils Relative %: 73 %
Platelets: 257 10*3/uL (ref 150–400)
RBC: 3.43 MIL/uL — ABNORMAL LOW (ref 4.22–5.81)
RDW: 15.6 % — ABNORMAL HIGH (ref 11.5–15.5)
WBC: 10.2 10*3/uL (ref 4.0–10.5)
nRBC: 0 % (ref 0.0–0.2)

## 2018-12-01 LAB — BASIC METABOLIC PANEL
Anion gap: 12 (ref 5–15)
BUN: 50 mg/dL — ABNORMAL HIGH (ref 6–20)
CO2: 27 mmol/L (ref 22–32)
Calcium: 8.5 mg/dL — ABNORMAL LOW (ref 8.9–10.3)
Chloride: 92 mmol/L — ABNORMAL LOW (ref 98–111)
Creatinine, Ser: 11.25 mg/dL — ABNORMAL HIGH (ref 0.61–1.24)
GFR calc Af Amer: 5 mL/min — ABNORMAL LOW (ref >60)
GFR calc non Af Amer: 5 mL/min — ABNORMAL LOW (ref >60)
Glucose, Bld: 343 mg/dL — ABNORMAL HIGH (ref 70–99)
Potassium: 4.1 mmol/L (ref 3.5–5.1)
Sodium: 131 mmol/L — ABNORMAL LOW (ref 135–145)

## 2018-12-01 LAB — SARS CORONAVIRUS 2 (TAT 6-24 HRS): SARS Coronavirus 2: NEGATIVE

## 2018-12-01 MED ORDER — DOXYCYCLINE HYCLATE 100 MG PO CAPS: 100.0000 mg | ORAL_CAPSULE | Freq: Two times a day (BID) | ORAL | 0 refills | Status: AC

## 2018-12-01 MED ORDER — AZITHROMYCIN 250 MG PO TABS
500.0000 mg | ORAL_TABLET | Freq: Once | ORAL | Status: AC
  Administered 2018-12-01: 500 mg via ORAL
  Filled 2018-12-01: qty 2

## 2018-12-01 NOTE — ED Triage Notes (Signed)
Patient arrived to ED as advised by PCP for intermittent shortness of breath since yesterday.  Patient denies cough, fever or pain.   Patient completed home dialysis yesterday.

## 2018-12-01 NOTE — ED Provider Notes (Signed)
[facility] EMERGENCY DEPARTMENT Provider Note   CSN: [id number] Arrival date & time: 12/01/18  1118     History   Chief Complaint Chief Complaint  Patient presents with  . Shortness of Breath    HPI Lance [name] is a 48 y.o. male with a history of CHF, type 1 diabetes, hypertension, hyperlipidemia and chronic renal failure on home hemodialysis on MTThF schedule, had a full treatment yesterday and reports is within 1 kg of his dry weight today presenting with intermittent shortness of breath which started yesterday.  He has noted shortness of breath with exertion, denies chest pain, dizziness, orthopnea and has had no peripheral edema.  He did have some mild nausea with diarrhea 2 days ago which has resolved spontaneously, has had no medications for treatment of these conditions.  He works as a Pharmacist, hospital with a private school, denies any known exposures to [employer].  He contacted his PCP who advised presenting here to rule out Covid.  He reports having wheezing earlier this morning which has resolved.  He has a history of community-acquired pneumonia and states his symptoms today resemble that condition.  He denies fevers or chills, weakness, palpitations.  He has had a mild occasional cough which has been productive of a white sputum.  He has had no treatment for his symptoms.    The history is provided by the patient.    Past Medical History:  Diagnosis Date  . Aortic stenosis    Very mild in 05/2007  . Arthritis    "right shoulder; right knee; feel like I've got it all over" (11/28/2015)  . CHF (congestive heart failure) ([place])   . Degenerative joint disease    Left TKA  . Diastolic heart failure (HCC)    LVEF 65-70% with grade 2 diastolic dysfunction  . ESRD (end stage renal disease) on dialysis [facility])    (as of 09/24/2017) pt does HD at home on a M/Tu/Th/F schedule.  . Essential hypertension, benign 2000   LVH  . GERD (gastroesophageal reflux disease)   . HCAP  (healthcare-associated pneumonia) 11/28/2015  . Hyperlipidemia 2000  . Loculated pleural effusion 11/28/2015   [name] 11/28/2015  . Pneumonia 12/2013; 05/2014  . PSVT (paroxysmal supraventricular tachycardia) (HCC)    Nonsustained; asymptomatic; diagnosed by event recorder in 2006  . Tobacco abuse, in remission    20 pack years; discontinued in 1985; bronchitic changes on chest x-ray and 2009  . Type 1 diabetes mellitus ([place]) 1990    Patient Active Problem List   Diagnosis Date Noted  . Vitamin D deficiency 06/09/2018  . Type 1 diabetes mellitus with other circulatory complications ([place]) [id number]  . Cardiac arrest ([place]) 09/24/2017  . Acute respiratory failure with hypoxia ([place]) 08/07/2017  . Respiratory distress 08/07/2017  . Dehiscence of amputation stump ([place]) 07/17/2017  . Benign essential HTN   . Chronic diastolic (congestive) heart failure ([place])   . Labile blood glucose   . Labile blood pressure   . Vomiting   . Phantom limb pain ([place])   . Poorly controlled type 2 diabetes mellitus with peripheral neuropathy ([place])   . Unilateral complete BKA, right, sequela ([place])   . Amputation of right lower extremity below knee ([place]) 06/10/2017  . ESRD on dialysis ([place])   . Chronic diastolic congestive heart failure ([place])   . History of PSVT (paroxysmal supraventricular tachycardia)   . Diabetes mellitus type 2 in nonobese (HCC)   . Post-operative pain   . Acute blood loss  anemia   . Anemia of chronic disease   . Leukocytosis   . Tachycardia   . History of right below knee amputation ([place]) 06/06/2017  . PVD (peripheral vascular disease) ([place]) 05/13/2017  . Bilateral pleural effusion 11/28/2015  . Loculated pleural effusion 11/28/2015  . HCAP (healthcare-associated pneumonia) 11/28/2015  . ESRD (end stage renal disease) on dialysis ([place]) 11/12/2015  . Hyperkalemia 11/12/2015  . Shoulder pain, acute 11/12/2015  . Nausea & vomiting 11/12/2015  . Diarrhea 11/12/2015  . Hypertensive  urgency 05/05/2014  . Nephrotic syndrome 12/29/2013  . Hyponatremia 12/28/2013  . Acute diastolic heart failure ([place]) 12/28/2013  . Hypoglycemia 12/28/2013  . Dyspnea   . Acute renal failure syndrome ([place])   . Diabetic ketoacidosis without coma associated with type 1 diabetes mellitus ([place])   . Demand ischemia ([place]) 12/24/2013  . DKA (diabetic ketoacidoses) ([place]) 12/23/2013  . CAP (community acquired pneumonia) 12/23/2013  . Sepsis due to pneumonia ([place]) 12/23/2013  . Metabolic acidemia 0000000  . Acute renal failure ([place]) 12/23/2013  . Diabetic ketoacidosis ([place]) 12/23/2013  . Chronic diastolic heart failure ([place]) 04/14/2013  . PSVT (paroxysmal supraventricular tachycardia) ([place]) 04/14/2013  . Bilateral leg edema 03/26/2013  . Chronic kidney disease, stage [name] (moderate) 09/29/2011  . Chest pain 09/26/2010  . Type 1 diabetes mellitus with end-stage renal disease ([place]) 09/26/2010  . Essential hypertension, benign 09/26/2010  . Mixed hyperlipidemia 09/26/2010    Past Surgical History:  Procedure Laterality Date  . AMPUTATION Right 06/06/2017   Procedure: RIGHT BELOW KNEE AMPUTATION;  Surgeon: [name], MD;  Location: [place];  Service: Orthopedics;  Laterality: Right;  . APPLICATION OF WOUND VAC Right 07/25/2017   Procedure: APPLICATION OF WOUND VAC;  Surgeon: [name], MD;  Location: [place];  Service: Orthopedics;  Laterality: Right;  . AV FISTULA PLACEMENT Right 06/23/2014   Procedure: ARTERIOVENOUS (AV) FISTULA CREATION;  Surgeon: Angelia Mould, MD;  Location: [place];  Service: Vascular;  Laterality: Right;  . CARDIAC CATHETERIZATION     [facility]- evaluation for Kidney/ pancreas transplant  . EYE SURGERY Bilateral    "for glaucoma"  . INGUINAL HERNIA REPAIR Right    As a child  . IR DIALY SHUNT INTRO NEEDLE/INTRACATH INITIAL W/IMG RIGHT Right 09/29/2017  . LOWER EXTREMITY ANGIOGRAPHY N/A 05/27/2017   Procedure: LOWER EXTREMITY ANGIOGRAPHY;  Surgeon: [name], MD;  Location: [place] CV LAB;  Service: Cardiovascular;  Laterality: N/A;  . STUMP REVISION Right 07/25/2017   Procedure: REVISION RIGHT BELOW KNEE AMPUTATION;;  Surgeon: [name], MD;  Location: [place];  Service: Orthopedics;  Laterality: Right;  . [place] Medications    Prior to Admission medications   Medication Sig Start Date End Date Taking? Authorizing Provider  amLODipine (NORVASC) 5 MG tablet Take 5 mg by mouth daily.  08/11/18  Yes [provider]  aspirin EC 81 MG tablet Take 81 mg by mouth daily.   Yes [provider]  calcitRIOL (ROCALTROL) 0.25 MCG capsule Take 1 capsule (0.25 mcg total) by mouth every Monday, Wednesday, and Friday with hemodialysis. Patient taking differently: Take 0.5 mcg by mouth See admin instructions. Take 0.25 mcg by mouth once a day on Mon/Wed/Fri with home dialysis 06/18/17  Yes Angiulli, Lavon Paganini, PA-C  Continuous Blood Gluc Receiver (FREESTYLE LIBRE 2 READER SYSTM) DEVI 1 Piece by Does not apply route as directed. 10/26/18  Yes Cassandria Anger,  MD  Continuous Blood Gluc Sensor (FREESTYLE LIBRE 14 DAY SENSOR) MISC Inject 1 each into the skin every 14 (fourteen) days. Use as directed. 10/26/18  Yes Nida, Marella Chimes, MD  glucose blood (ACCU-CHEK GUIDE) test strip Use as instructed 4 x daily. E11.65 02/19/18  Yes Nida, Marella Chimes, MD  Insulin Glargine (LANTUS SOLOSTAR) 100 UNIT/ML Solostar Pen Inject 34 Units into the skin daily. 05/07/18  Yes Nida, Marella Chimes, MD  insulin lispro (HUMALOG) 100 UNIT/ML KwikPen INJECT 15 TO 21 UNITS INTO THE SKIN THREE TIMES DAILY BEFORE MEALS Patient taking differently: Inject 15-20 Units into the skin 3 (three) times daily.  07/24/18  Yes Nida, Marella Chimes, MD  metoprolol succinate (TOPROL-XL) 25 MG 24 hr tablet Take 25 mg by mouth every evening.  08/11/18  Yes [provider]  nitroGLYCERIN (NITROSTAT) 0.4 MG SL tablet Place 0.4 mg  under the tongue every 5 (five) minutes as needed for chest pain.  08/20/18 08/20/19 Yes [provider]  pantoprazole (PROTONIX) 40 MG tablet TAKE 1 TABLET BY MOUTH EVERY DAY Patient taking differently: Take 40 mg by mouth daily.  09/08/17  Yes [name], MD  pravastatin (PRAVACHOL) 10 MG tablet Take 10 mg by mouth every evening.  09/14/18  Yes [provider]  sucroferric oxyhydroxide (VELPHORO) 500 MG chewable tablet Chew 3 tablets (1,500 mg total) by mouth 3 (three) times daily. 06/18/17  Yes Angiulli, Lavon Paganini, PA-C  ticagrelor (BRILINTA) 90 MG TABS tablet Take 90 mg by mouth 2 (two) times daily.  08/20/18 08/20/19 Yes [provider]    Family History Family History  Problem Relation Age of Onset  . Diabetes Father   . Hypertension Father   . Diabetes Mother   . Hypertension Mother     Social History Social History   Tobacco Use  . Smoking status: Never Smoker  . Smokeless tobacco: Never Used  Substance Use Topics  . Alcohol use: Not Currently    Comment: 11/28/2015 "used to drink; stopped ~ 2 yr ago"  . Drug use: No     Allergies   Atorvastatin, Minoxidil, Adhesive [tape], and Statins   Review of Systems Review of Systems  Constitutional: Negative for chills and fever.  HENT: Negative for congestion and sore throat.   Eyes: Negative.   Respiratory: Positive for shortness of breath and wheezing. Negative for chest tightness.   Cardiovascular: Negative for chest pain, palpitations and leg swelling.  Gastrointestinal: Positive for diarrhea and nausea. Negative for abdominal pain and vomiting.  Genitourinary: Negative.   Musculoskeletal: Negative for arthralgias, joint swelling and neck pain.  Skin: Negative.  Negative for rash and wound.  Neurological: Negative for dizziness, weakness, light-headedness, numbness and headaches.  Psychiatric/Behavioral: Negative.      Physical Exam Updated Vital Signs BP (!) 141/68   Pulse 87   Temp 98.4  F (36.9 C) (Oral)   Resp 18   Ht 6\' 3"  (1.905 m)   Wt 104.3 kg   SpO2 93%   BMI 28.75 kg/m   Physical Exam Vitals signs and nursing note reviewed.  Constitutional:      Appearance: He is well-developed.  HENT:     Head: Normocephalic and atraumatic.  Eyes:     Conjunctiva/sclera: Conjunctivae normal.  Neck:     Musculoskeletal: Normal range of motion.  Cardiovascular:     Rate and Rhythm: Normal rate and regular rhythm.     Heart sounds: Normal heart sounds.  Pulmonary:     Effort:  Pulmonary effort is normal. No respiratory distress.     Breath sounds: Normal breath sounds. No decreased breath sounds, wheezing, rhonchi or rales.  Abdominal:     General: Bowel sounds are normal.     Palpations: Abdomen is soft. There is no mass.     Tenderness: There is no abdominal tenderness.  Musculoskeletal: Normal range of motion.     Comments: Right leg bka  Skin:    General: Skin is warm and dry.     Capillary Refill: Capillary refill takes less than 2 seconds.  Neurological:     Mental Status: He is alert.      ED Treatments / Results  Labs (all labs ordered are listed, but only abnormal results are displayed) Labs Reviewed  CBC WITH DIFFERENTIAL/PLATELET - Abnormal; Notable for the following components:      Result Value   RBC 3.43 (*)    Hemoglobin 11.1 (*)    HCT 35.6 (*)    MCV 103.8 (*)    RDW 15.6 (*)    All other components within normal limits  BASIC METABOLIC PANEL - Abnormal; Notable for the following components:   Sodium 131 (*)    Chloride 92 (*)    Glucose, Bld 343 (*)    BUN 50 (*)    Creatinine, Ser 11.25 (*)    Calcium 8.5 (*)    GFR calc non Af Amer 5 (*)    GFR calc Af Amer 5 (*)    All other components within normal limits  SARS CORONAVIRUS 2 (TAT 6-24 HRS)    EKG EKG Interpretation  Date/Time:  Tuesday December 01 2018 12:39:20 EDT Ventricular Rate:  89 PR Interval:    QRS Duration: 92 QT Interval:  407 QTC Calculation: 496 R Axis:    88 Text Interpretation:  Sinus rhythm Probable left atrial enlargement Probable LVH with secondary repol abnrm Borderline prolonged QT interval compared wtih prior EKG's - no signficant changes Confirmed by Noemi Chapel [phone]) on 12/01/2018 1:42:55 PM   Radiology Dg Chest Portable 1 View  Result Date: 12/01/2018 CLINICAL DATA:  Shortness of breath. EXAM: PORTABLE CHEST 1 VIEW COMPARISON:  10/24/2018 FINDINGS: There is mild cardiomegaly with new slight pulmonary vascular congestion. No discrete pulmonary edema or pleural effusions. Aortic atherosclerosis. Vascular stent in the right axilla. No bone abnormality. IMPRESSION: 1. New slight pulmonary vascular congestion. 2. Aortic atherosclerosis. 3. No discrete pulmonary edema or pleural effusions. Electronically Signed   By: Lorriane Shire M.D.   On: 12/01/2018 13:09    Procedures Procedures (including critical care time)  Medications Ordered in ED Medications  azithromycin (ZITHROMAX) tablet 500 mg (has no administration in time range)     Initial Impression / Assessment and Plan / ED Course  I have reviewed the triage vital signs and the nursing notes.  Pertinent labs & imaging results that were available during my care of the patient were reviewed by me and considered in my medical decision making (see chart for details).        Imaging and labs reviewed and interpreted.  CXR with slight vascular congestion, no peripheral edema, no rales on exam.  No pneumonia appreciated.  Labs stable.  Hgb 11.1 today, average hemoglobin historically has been 11.3 to 12.6 with last hgb on 9/12 being 15.4 , suspected outlier, possibly hemoconcentration during that visit.   Discussed elevated cbg and elevation in BUN and creatinine.  He has not had his dialysis today but will complete once home.  He  also states did not take his lantus or his humalog yesterday due to  Nausea and reduced intake ytd.  He has sliding scale schedule and states will easily  reduce with home tx.  No anion gap.  He only makes urine qam, therefore no UA performed.    Pt will be started on zithromax given respiratory complaints and high risk for infection.  Covid test pending. Advised home quarantine until he has this result and negative.    Roddie Mc [name] was evaluated in Emergency Department on 12/01/2018 for the symptoms described in the history of present illness. He was evaluated in the context of the global COVID-19 pandemic, which necessitated consideration that the patient might be at risk for infection with the SARS-CoV-2 virus that causes COVID-19. Institutional protocols and algorithms that pertain to the evaluation of patients at risk for COVID-19 are in a state of rapid change based on information released by regulatory bodies including the CDC and federal and state organizations. These policies and algorithms were followed during the patient's care in the ED.   Final Clinical Impressions(s) / ED Diagnoses   Final diagnoses:  Shortness of breath  Hyperglycemia  ESRD (end stage renal disease) [facility])    ED Discharge Orders    None       Landis Martins 12/01/18 1457    Noemi Chapel, MD 12/02/18 [phone]

## 2018-12-01 NOTE — Discharge Instructions (Addendum)
Complete your home dialysis today as planned and start your sliding scale insulin to start improving your blood glucose level as discussed.  You are being started on an antibiotic given your symptoms, even though today's xray does not suggest a lung infection or pneumonia.  Take your next dose of zithromax tomorrow.  Your Covid 19 test should be resulted by tomorrow and you will be notified if this test is positive.  In the interim, you should maintain home isolation until this test results (and is negative).  If positive, you will need to stay home for 10 days from the day of onset of symptoms, longer if your symptoms persist.  Get rechecked if you have any worsened symptoms.

## 2018-12-02 ENCOUNTER — Other Ambulatory Visit: Payer: Medicare Other

## 2018-12-03 DIAGNOSIS — N186 End stage renal disease: Secondary | ICD-10-CM | POA: Diagnosis not present

## 2018-12-03 DIAGNOSIS — K7689 Other specified diseases of liver: Secondary | ICD-10-CM | POA: Diagnosis not present

## 2018-12-03 DIAGNOSIS — Z4931 Encounter for adequacy testing for hemodialysis: Secondary | ICD-10-CM | POA: Diagnosis not present

## 2018-12-03 DIAGNOSIS — Z992 Dependence on renal dialysis: Secondary | ICD-10-CM | POA: Diagnosis not present

## 2018-12-03 DIAGNOSIS — D631 Anemia in chronic kidney disease: Secondary | ICD-10-CM | POA: Diagnosis not present

## 2018-12-03 DIAGNOSIS — N2581 Secondary hyperparathyroidism of renal origin: Secondary | ICD-10-CM | POA: Diagnosis not present

## 2018-12-04 DIAGNOSIS — N2581 Secondary hyperparathyroidism of renal origin: Secondary | ICD-10-CM | POA: Diagnosis not present

## 2018-12-04 DIAGNOSIS — D631 Anemia in chronic kidney disease: Secondary | ICD-10-CM | POA: Diagnosis not present

## 2018-12-04 DIAGNOSIS — K7689 Other specified diseases of liver: Secondary | ICD-10-CM | POA: Diagnosis not present

## 2018-12-04 DIAGNOSIS — Z4931 Encounter for adequacy testing for hemodialysis: Secondary | ICD-10-CM | POA: Diagnosis not present

## 2018-12-04 DIAGNOSIS — N186 End stage renal disease: Secondary | ICD-10-CM | POA: Diagnosis not present

## 2018-12-04 DIAGNOSIS — Z992 Dependence on renal dialysis: Secondary | ICD-10-CM | POA: Diagnosis not present

## 2018-12-07 DIAGNOSIS — D631 Anemia in chronic kidney disease: Secondary | ICD-10-CM | POA: Diagnosis not present

## 2018-12-07 DIAGNOSIS — N2581 Secondary hyperparathyroidism of renal origin: Secondary | ICD-10-CM | POA: Diagnosis not present

## 2018-12-07 DIAGNOSIS — K7689 Other specified diseases of liver: Secondary | ICD-10-CM | POA: Diagnosis not present

## 2018-12-07 DIAGNOSIS — Z992 Dependence on renal dialysis: Secondary | ICD-10-CM | POA: Diagnosis not present

## 2018-12-07 DIAGNOSIS — N186 End stage renal disease: Secondary | ICD-10-CM | POA: Diagnosis not present

## 2018-12-07 DIAGNOSIS — Z4931 Encounter for adequacy testing for hemodialysis: Secondary | ICD-10-CM | POA: Diagnosis not present

## 2018-12-08 DIAGNOSIS — Z4931 Encounter for adequacy testing for hemodialysis: Secondary | ICD-10-CM | POA: Diagnosis not present

## 2018-12-08 DIAGNOSIS — K7689 Other specified diseases of liver: Secondary | ICD-10-CM | POA: Diagnosis not present

## 2018-12-08 DIAGNOSIS — D631 Anemia in chronic kidney disease: Secondary | ICD-10-CM | POA: Diagnosis not present

## 2018-12-08 DIAGNOSIS — N186 End stage renal disease: Secondary | ICD-10-CM | POA: Diagnosis not present

## 2018-12-08 DIAGNOSIS — Z992 Dependence on renal dialysis: Secondary | ICD-10-CM | POA: Diagnosis not present

## 2018-12-08 DIAGNOSIS — N2581 Secondary hyperparathyroidism of renal origin: Secondary | ICD-10-CM | POA: Diagnosis not present

## 2018-12-10 DIAGNOSIS — N2581 Secondary hyperparathyroidism of renal origin: Secondary | ICD-10-CM | POA: Diagnosis not present

## 2018-12-10 DIAGNOSIS — D631 Anemia in chronic kidney disease: Secondary | ICD-10-CM | POA: Diagnosis not present

## 2018-12-10 DIAGNOSIS — N186 End stage renal disease: Secondary | ICD-10-CM | POA: Diagnosis not present

## 2018-12-10 DIAGNOSIS — Z4931 Encounter for adequacy testing for hemodialysis: Secondary | ICD-10-CM | POA: Diagnosis not present

## 2018-12-10 DIAGNOSIS — Z992 Dependence on renal dialysis: Secondary | ICD-10-CM | POA: Diagnosis not present

## 2018-12-10 DIAGNOSIS — K7689 Other specified diseases of liver: Secondary | ICD-10-CM | POA: Diagnosis not present

## 2018-12-11 DIAGNOSIS — N2581 Secondary hyperparathyroidism of renal origin: Secondary | ICD-10-CM | POA: Diagnosis not present

## 2018-12-11 DIAGNOSIS — K7689 Other specified diseases of liver: Secondary | ICD-10-CM | POA: Diagnosis not present

## 2018-12-11 DIAGNOSIS — D631 Anemia in chronic kidney disease: Secondary | ICD-10-CM | POA: Diagnosis not present

## 2018-12-11 DIAGNOSIS — N186 End stage renal disease: Secondary | ICD-10-CM | POA: Diagnosis not present

## 2018-12-11 DIAGNOSIS — Z992 Dependence on renal dialysis: Secondary | ICD-10-CM | POA: Diagnosis not present

## 2018-12-11 DIAGNOSIS — Z4931 Encounter for adequacy testing for hemodialysis: Secondary | ICD-10-CM | POA: Diagnosis not present

## 2018-12-13 DIAGNOSIS — N186 End stage renal disease: Secondary | ICD-10-CM | POA: Diagnosis not present

## 2018-12-13 DIAGNOSIS — Z992 Dependence on renal dialysis: Secondary | ICD-10-CM | POA: Diagnosis not present

## 2018-12-13 DIAGNOSIS — E1022 Type 1 diabetes mellitus with diabetic chronic kidney disease: Secondary | ICD-10-CM | POA: Diagnosis not present

## 2018-12-14 DIAGNOSIS — Z4931 Encounter for adequacy testing for hemodialysis: Secondary | ICD-10-CM | POA: Diagnosis not present

## 2018-12-14 DIAGNOSIS — Z79899 Other long term (current) drug therapy: Secondary | ICD-10-CM | POA: Diagnosis not present

## 2018-12-14 DIAGNOSIS — K7689 Other specified diseases of liver: Secondary | ICD-10-CM | POA: Diagnosis not present

## 2018-12-14 DIAGNOSIS — Z992 Dependence on renal dialysis: Secondary | ICD-10-CM | POA: Diagnosis not present

## 2018-12-14 DIAGNOSIS — N2581 Secondary hyperparathyroidism of renal origin: Secondary | ICD-10-CM | POA: Diagnosis not present

## 2018-12-14 DIAGNOSIS — D631 Anemia in chronic kidney disease: Secondary | ICD-10-CM | POA: Diagnosis not present

## 2018-12-14 DIAGNOSIS — E440 Moderate protein-calorie malnutrition: Secondary | ICD-10-CM | POA: Diagnosis not present

## 2018-12-14 DIAGNOSIS — D509 Iron deficiency anemia, unspecified: Secondary | ICD-10-CM | POA: Diagnosis not present

## 2018-12-14 DIAGNOSIS — R509 Fever, unspecified: Secondary | ICD-10-CM | POA: Diagnosis not present

## 2018-12-14 DIAGNOSIS — E8779 Other fluid overload: Secondary | ICD-10-CM | POA: Diagnosis not present

## 2018-12-14 DIAGNOSIS — I120 Hypertensive chronic kidney disease with stage 5 chronic kidney disease or end stage renal disease: Secondary | ICD-10-CM | POA: Diagnosis not present

## 2018-12-14 DIAGNOSIS — R17 Unspecified jaundice: Secondary | ICD-10-CM | POA: Diagnosis not present

## 2018-12-14 DIAGNOSIS — N186 End stage renal disease: Secondary | ICD-10-CM | POA: Diagnosis not present

## 2018-12-15 DIAGNOSIS — R509 Fever, unspecified: Secondary | ICD-10-CM | POA: Diagnosis not present

## 2018-12-15 DIAGNOSIS — Z4931 Encounter for adequacy testing for hemodialysis: Secondary | ICD-10-CM | POA: Diagnosis not present

## 2018-12-15 DIAGNOSIS — E440 Moderate protein-calorie malnutrition: Secondary | ICD-10-CM | POA: Diagnosis not present

## 2018-12-15 DIAGNOSIS — D509 Iron deficiency anemia, unspecified: Secondary | ICD-10-CM | POA: Diagnosis not present

## 2018-12-15 DIAGNOSIS — N186 End stage renal disease: Secondary | ICD-10-CM | POA: Diagnosis not present

## 2018-12-15 DIAGNOSIS — Z992 Dependence on renal dialysis: Secondary | ICD-10-CM | POA: Diagnosis not present

## 2018-12-16 ENCOUNTER — Ambulatory Visit
Admission: RE | Admit: 2018-12-16 | Discharge: 2018-12-16 | Disposition: A | Discharge status: Home / Self Care | Payer: BC Managed Care – PPO | Source: Ambulatory Visit | Attending: Surgery | Admitting: Surgery

## 2018-12-16 DIAGNOSIS — N2581 Secondary hyperparathyroidism of renal origin: Secondary | ICD-10-CM

## 2018-12-16 DIAGNOSIS — E049 Nontoxic goiter, unspecified: Secondary | ICD-10-CM | POA: Diagnosis not present

## 2018-12-16 DIAGNOSIS — E211 Secondary hyperparathyroidism, not elsewhere classified: Secondary | ICD-10-CM | POA: Diagnosis not present

## 2018-12-16 DIAGNOSIS — D509 Iron deficiency anemia, unspecified: Secondary | ICD-10-CM | POA: Diagnosis not present

## 2018-12-16 DIAGNOSIS — N186 End stage renal disease: Secondary | ICD-10-CM | POA: Diagnosis not present

## 2018-12-16 DIAGNOSIS — E440 Moderate protein-calorie malnutrition: Secondary | ICD-10-CM | POA: Diagnosis not present

## 2018-12-16 DIAGNOSIS — Z4931 Encounter for adequacy testing for hemodialysis: Secondary | ICD-10-CM | POA: Diagnosis not present

## 2018-12-16 DIAGNOSIS — Z992 Dependence on renal dialysis: Secondary | ICD-10-CM | POA: Diagnosis not present

## 2018-12-16 DIAGNOSIS — R509 Fever, unspecified: Secondary | ICD-10-CM | POA: Diagnosis not present

## 2018-12-17 DIAGNOSIS — E440 Moderate protein-calorie malnutrition: Secondary | ICD-10-CM | POA: Diagnosis not present

## 2018-12-17 DIAGNOSIS — D509 Iron deficiency anemia, unspecified: Secondary | ICD-10-CM | POA: Diagnosis not present

## 2018-12-17 DIAGNOSIS — N186 End stage renal disease: Secondary | ICD-10-CM | POA: Diagnosis not present

## 2018-12-17 DIAGNOSIS — Z4931 Encounter for adequacy testing for hemodialysis: Secondary | ICD-10-CM | POA: Diagnosis not present

## 2018-12-17 DIAGNOSIS — Z992 Dependence on renal dialysis: Secondary | ICD-10-CM | POA: Diagnosis not present

## 2018-12-17 DIAGNOSIS — R509 Fever, unspecified: Secondary | ICD-10-CM | POA: Diagnosis not present

## 2018-12-18 DIAGNOSIS — D509 Iron deficiency anemia, unspecified: Secondary | ICD-10-CM | POA: Diagnosis not present

## 2018-12-18 DIAGNOSIS — Z992 Dependence on renal dialysis: Secondary | ICD-10-CM | POA: Diagnosis not present

## 2018-12-18 DIAGNOSIS — R509 Fever, unspecified: Secondary | ICD-10-CM | POA: Diagnosis not present

## 2018-12-18 DIAGNOSIS — N186 End stage renal disease: Secondary | ICD-10-CM | POA: Diagnosis not present

## 2018-12-18 DIAGNOSIS — E440 Moderate protein-calorie malnutrition: Secondary | ICD-10-CM | POA: Diagnosis not present

## 2018-12-18 DIAGNOSIS — Z4931 Encounter for adequacy testing for hemodialysis: Secondary | ICD-10-CM | POA: Diagnosis not present

## 2018-12-21 ENCOUNTER — Other Ambulatory Visit: Payer: Self-pay | Admitting: "Endocrinology

## 2018-12-21 DIAGNOSIS — E440 Moderate protein-calorie malnutrition: Secondary | ICD-10-CM | POA: Diagnosis not present

## 2018-12-21 DIAGNOSIS — Z4931 Encounter for adequacy testing for hemodialysis: Secondary | ICD-10-CM | POA: Diagnosis not present

## 2018-12-21 DIAGNOSIS — R509 Fever, unspecified: Secondary | ICD-10-CM | POA: Diagnosis not present

## 2018-12-21 DIAGNOSIS — N186 End stage renal disease: Secondary | ICD-10-CM | POA: Diagnosis not present

## 2018-12-21 DIAGNOSIS — Z992 Dependence on renal dialysis: Secondary | ICD-10-CM | POA: Diagnosis not present

## 2018-12-21 DIAGNOSIS — D509 Iron deficiency anemia, unspecified: Secondary | ICD-10-CM | POA: Diagnosis not present

## 2018-12-22 DIAGNOSIS — R509 Fever, unspecified: Secondary | ICD-10-CM | POA: Diagnosis not present

## 2018-12-22 DIAGNOSIS — E440 Moderate protein-calorie malnutrition: Secondary | ICD-10-CM | POA: Diagnosis not present

## 2018-12-22 DIAGNOSIS — Z4931 Encounter for adequacy testing for hemodialysis: Secondary | ICD-10-CM | POA: Diagnosis not present

## 2018-12-22 DIAGNOSIS — D509 Iron deficiency anemia, unspecified: Secondary | ICD-10-CM | POA: Diagnosis not present

## 2018-12-22 DIAGNOSIS — Z992 Dependence on renal dialysis: Secondary | ICD-10-CM | POA: Diagnosis not present

## 2018-12-22 DIAGNOSIS — N186 End stage renal disease: Secondary | ICD-10-CM | POA: Diagnosis not present

## 2018-12-23 ENCOUNTER — Other Ambulatory Visit: Payer: Self-pay | Admitting: "Endocrinology

## 2018-12-24 DIAGNOSIS — R509 Fever, unspecified: Secondary | ICD-10-CM | POA: Diagnosis not present

## 2018-12-24 DIAGNOSIS — Z992 Dependence on renal dialysis: Secondary | ICD-10-CM | POA: Diagnosis not present

## 2018-12-24 DIAGNOSIS — E440 Moderate protein-calorie malnutrition: Secondary | ICD-10-CM | POA: Diagnosis not present

## 2018-12-24 DIAGNOSIS — Z4931 Encounter for adequacy testing for hemodialysis: Secondary | ICD-10-CM | POA: Diagnosis not present

## 2018-12-24 DIAGNOSIS — N186 End stage renal disease: Secondary | ICD-10-CM | POA: Diagnosis not present

## 2018-12-24 DIAGNOSIS — D509 Iron deficiency anemia, unspecified: Secondary | ICD-10-CM | POA: Diagnosis not present

## 2018-12-25 DIAGNOSIS — R509 Fever, unspecified: Secondary | ICD-10-CM | POA: Diagnosis not present

## 2018-12-25 DIAGNOSIS — Z992 Dependence on renal dialysis: Secondary | ICD-10-CM | POA: Diagnosis not present

## 2018-12-25 DIAGNOSIS — D509 Iron deficiency anemia, unspecified: Secondary | ICD-10-CM | POA: Diagnosis not present

## 2018-12-25 DIAGNOSIS — N186 End stage renal disease: Secondary | ICD-10-CM | POA: Diagnosis not present

## 2018-12-25 DIAGNOSIS — Z4931 Encounter for adequacy testing for hemodialysis: Secondary | ICD-10-CM | POA: Diagnosis not present

## 2018-12-25 DIAGNOSIS — E440 Moderate protein-calorie malnutrition: Secondary | ICD-10-CM | POA: Diagnosis not present

## 2018-12-28 DIAGNOSIS — N186 End stage renal disease: Secondary | ICD-10-CM | POA: Diagnosis not present

## 2018-12-28 DIAGNOSIS — Z4931 Encounter for adequacy testing for hemodialysis: Secondary | ICD-10-CM | POA: Diagnosis not present

## 2018-12-28 DIAGNOSIS — E440 Moderate protein-calorie malnutrition: Secondary | ICD-10-CM | POA: Diagnosis not present

## 2018-12-28 DIAGNOSIS — R509 Fever, unspecified: Secondary | ICD-10-CM | POA: Diagnosis not present

## 2018-12-28 DIAGNOSIS — Z992 Dependence on renal dialysis: Secondary | ICD-10-CM | POA: Diagnosis not present

## 2018-12-28 DIAGNOSIS — D509 Iron deficiency anemia, unspecified: Secondary | ICD-10-CM | POA: Diagnosis not present

## 2018-12-29 DIAGNOSIS — D509 Iron deficiency anemia, unspecified: Secondary | ICD-10-CM | POA: Diagnosis not present

## 2018-12-29 DIAGNOSIS — Z4931 Encounter for adequacy testing for hemodialysis: Secondary | ICD-10-CM | POA: Diagnosis not present

## 2018-12-29 DIAGNOSIS — N186 End stage renal disease: Secondary | ICD-10-CM | POA: Diagnosis not present

## 2018-12-29 DIAGNOSIS — Z992 Dependence on renal dialysis: Secondary | ICD-10-CM | POA: Diagnosis not present

## 2018-12-29 DIAGNOSIS — R509 Fever, unspecified: Secondary | ICD-10-CM | POA: Diagnosis not present

## 2018-12-29 DIAGNOSIS — E440 Moderate protein-calorie malnutrition: Secondary | ICD-10-CM | POA: Diagnosis not present

## 2018-12-31 DIAGNOSIS — Z4931 Encounter for adequacy testing for hemodialysis: Secondary | ICD-10-CM | POA: Diagnosis not present

## 2018-12-31 DIAGNOSIS — R509 Fever, unspecified: Secondary | ICD-10-CM | POA: Diagnosis not present

## 2018-12-31 DIAGNOSIS — N186 End stage renal disease: Secondary | ICD-10-CM | POA: Diagnosis not present

## 2018-12-31 DIAGNOSIS — D509 Iron deficiency anemia, unspecified: Secondary | ICD-10-CM | POA: Diagnosis not present

## 2018-12-31 DIAGNOSIS — Z992 Dependence on renal dialysis: Secondary | ICD-10-CM | POA: Diagnosis not present

## 2018-12-31 DIAGNOSIS — E440 Moderate protein-calorie malnutrition: Secondary | ICD-10-CM | POA: Diagnosis not present

## 2019-01-01 DIAGNOSIS — Z992 Dependence on renal dialysis: Secondary | ICD-10-CM | POA: Diagnosis not present

## 2019-01-01 DIAGNOSIS — D509 Iron deficiency anemia, unspecified: Secondary | ICD-10-CM | POA: Diagnosis not present

## 2019-01-01 DIAGNOSIS — E440 Moderate protein-calorie malnutrition: Secondary | ICD-10-CM | POA: Diagnosis not present

## 2019-01-01 DIAGNOSIS — Z4931 Encounter for adequacy testing for hemodialysis: Secondary | ICD-10-CM | POA: Diagnosis not present

## 2019-01-01 DIAGNOSIS — N186 End stage renal disease: Secondary | ICD-10-CM | POA: Diagnosis not present

## 2019-01-01 DIAGNOSIS — R509 Fever, unspecified: Secondary | ICD-10-CM | POA: Diagnosis not present

## 2019-01-03 DIAGNOSIS — E440 Moderate protein-calorie malnutrition: Secondary | ICD-10-CM | POA: Diagnosis not present

## 2019-01-03 DIAGNOSIS — Z4931 Encounter for adequacy testing for hemodialysis: Secondary | ICD-10-CM | POA: Diagnosis not present

## 2019-01-03 DIAGNOSIS — Z992 Dependence on renal dialysis: Secondary | ICD-10-CM | POA: Diagnosis not present

## 2019-01-03 DIAGNOSIS — R509 Fever, unspecified: Secondary | ICD-10-CM | POA: Diagnosis not present

## 2019-01-03 DIAGNOSIS — D509 Iron deficiency anemia, unspecified: Secondary | ICD-10-CM | POA: Diagnosis not present

## 2019-01-03 DIAGNOSIS — N186 End stage renal disease: Secondary | ICD-10-CM | POA: Diagnosis not present

## 2019-01-05 DIAGNOSIS — Z992 Dependence on renal dialysis: Secondary | ICD-10-CM | POA: Diagnosis not present

## 2019-01-05 DIAGNOSIS — R509 Fever, unspecified: Secondary | ICD-10-CM | POA: Diagnosis not present

## 2019-01-05 DIAGNOSIS — Z4931 Encounter for adequacy testing for hemodialysis: Secondary | ICD-10-CM | POA: Diagnosis not present

## 2019-01-05 DIAGNOSIS — E440 Moderate protein-calorie malnutrition: Secondary | ICD-10-CM | POA: Diagnosis not present

## 2019-01-05 DIAGNOSIS — D509 Iron deficiency anemia, unspecified: Secondary | ICD-10-CM | POA: Diagnosis not present

## 2019-01-05 DIAGNOSIS — N186 End stage renal disease: Secondary | ICD-10-CM | POA: Diagnosis not present

## 2019-01-07 DIAGNOSIS — Z992 Dependence on renal dialysis: Secondary | ICD-10-CM | POA: Diagnosis not present

## 2019-01-07 DIAGNOSIS — Z4931 Encounter for adequacy testing for hemodialysis: Secondary | ICD-10-CM | POA: Diagnosis not present

## 2019-01-07 DIAGNOSIS — E440 Moderate protein-calorie malnutrition: Secondary | ICD-10-CM | POA: Diagnosis not present

## 2019-01-07 DIAGNOSIS — D509 Iron deficiency anemia, unspecified: Secondary | ICD-10-CM | POA: Diagnosis not present

## 2019-01-07 DIAGNOSIS — N186 End stage renal disease: Secondary | ICD-10-CM | POA: Diagnosis not present

## 2019-01-07 DIAGNOSIS — R509 Fever, unspecified: Secondary | ICD-10-CM | POA: Diagnosis not present

## 2019-01-08 DIAGNOSIS — N186 End stage renal disease: Secondary | ICD-10-CM | POA: Diagnosis not present

## 2019-01-08 DIAGNOSIS — E440 Moderate protein-calorie malnutrition: Secondary | ICD-10-CM | POA: Diagnosis not present

## 2019-01-08 DIAGNOSIS — D509 Iron deficiency anemia, unspecified: Secondary | ICD-10-CM | POA: Diagnosis not present

## 2019-01-08 DIAGNOSIS — R509 Fever, unspecified: Secondary | ICD-10-CM | POA: Diagnosis not present

## 2019-01-08 DIAGNOSIS — Z992 Dependence on renal dialysis: Secondary | ICD-10-CM | POA: Diagnosis not present

## 2019-01-08 DIAGNOSIS — Z4931 Encounter for adequacy testing for hemodialysis: Secondary | ICD-10-CM | POA: Diagnosis not present

## 2019-01-11 DIAGNOSIS — N186 End stage renal disease: Secondary | ICD-10-CM | POA: Diagnosis not present

## 2019-01-11 DIAGNOSIS — Z4931 Encounter for adequacy testing for hemodialysis: Secondary | ICD-10-CM | POA: Diagnosis not present

## 2019-01-11 DIAGNOSIS — Z992 Dependence on renal dialysis: Secondary | ICD-10-CM | POA: Diagnosis not present

## 2019-01-11 DIAGNOSIS — E440 Moderate protein-calorie malnutrition: Secondary | ICD-10-CM | POA: Diagnosis not present

## 2019-01-11 DIAGNOSIS — R509 Fever, unspecified: Secondary | ICD-10-CM | POA: Diagnosis not present

## 2019-01-11 DIAGNOSIS — D509 Iron deficiency anemia, unspecified: Secondary | ICD-10-CM | POA: Diagnosis not present

## 2019-01-12 DIAGNOSIS — Z79899 Other long term (current) drug therapy: Secondary | ICD-10-CM | POA: Diagnosis not present

## 2019-01-12 DIAGNOSIS — K7689 Other specified diseases of liver: Secondary | ICD-10-CM | POA: Diagnosis not present

## 2019-01-12 DIAGNOSIS — D631 Anemia in chronic kidney disease: Secondary | ICD-10-CM | POA: Diagnosis not present

## 2019-01-12 DIAGNOSIS — D509 Iron deficiency anemia, unspecified: Secondary | ICD-10-CM | POA: Diagnosis not present

## 2019-01-12 DIAGNOSIS — I120 Hypertensive chronic kidney disease with stage 5 chronic kidney disease or end stage renal disease: Secondary | ICD-10-CM | POA: Diagnosis not present

## 2019-01-12 DIAGNOSIS — E8779 Other fluid overload: Secondary | ICD-10-CM | POA: Diagnosis not present

## 2019-01-12 DIAGNOSIS — Z992 Dependence on renal dialysis: Secondary | ICD-10-CM | POA: Diagnosis not present

## 2019-01-12 DIAGNOSIS — Z4931 Encounter for adequacy testing for hemodialysis: Secondary | ICD-10-CM | POA: Diagnosis not present

## 2019-01-12 DIAGNOSIS — N186 End stage renal disease: Secondary | ICD-10-CM | POA: Diagnosis not present

## 2019-01-12 DIAGNOSIS — R17 Unspecified jaundice: Secondary | ICD-10-CM | POA: Diagnosis not present

## 2019-01-12 DIAGNOSIS — R509 Fever, unspecified: Secondary | ICD-10-CM | POA: Diagnosis not present

## 2019-01-12 DIAGNOSIS — N2581 Secondary hyperparathyroidism of renal origin: Secondary | ICD-10-CM | POA: Diagnosis not present

## 2019-01-12 DIAGNOSIS — E440 Moderate protein-calorie malnutrition: Secondary | ICD-10-CM | POA: Diagnosis not present

## 2019-01-13 DIAGNOSIS — R17 Unspecified jaundice: Secondary | ICD-10-CM | POA: Diagnosis not present

## 2019-01-13 DIAGNOSIS — Z992 Dependence on renal dialysis: Secondary | ICD-10-CM | POA: Diagnosis not present

## 2019-01-13 DIAGNOSIS — D509 Iron deficiency anemia, unspecified: Secondary | ICD-10-CM | POA: Diagnosis not present

## 2019-01-13 DIAGNOSIS — Z4931 Encounter for adequacy testing for hemodialysis: Secondary | ICD-10-CM | POA: Diagnosis not present

## 2019-01-13 DIAGNOSIS — N186 End stage renal disease: Secondary | ICD-10-CM | POA: Diagnosis not present

## 2019-01-13 DIAGNOSIS — Z79899 Other long term (current) drug therapy: Secondary | ICD-10-CM | POA: Diagnosis not present

## 2019-01-14 DIAGNOSIS — Z79899 Other long term (current) drug therapy: Secondary | ICD-10-CM | POA: Diagnosis not present

## 2019-01-14 DIAGNOSIS — N186 End stage renal disease: Secondary | ICD-10-CM | POA: Diagnosis not present

## 2019-01-14 DIAGNOSIS — Z992 Dependence on renal dialysis: Secondary | ICD-10-CM | POA: Diagnosis not present

## 2019-01-14 DIAGNOSIS — Z4931 Encounter for adequacy testing for hemodialysis: Secondary | ICD-10-CM | POA: Diagnosis not present

## 2019-01-14 DIAGNOSIS — D509 Iron deficiency anemia, unspecified: Secondary | ICD-10-CM | POA: Diagnosis not present

## 2019-01-14 DIAGNOSIS — R17 Unspecified jaundice: Secondary | ICD-10-CM | POA: Diagnosis not present

## 2019-01-15 DIAGNOSIS — R17 Unspecified jaundice: Secondary | ICD-10-CM | POA: Diagnosis not present

## 2019-01-15 DIAGNOSIS — Z992 Dependence on renal dialysis: Secondary | ICD-10-CM | POA: Diagnosis not present

## 2019-01-15 DIAGNOSIS — Z79899 Other long term (current) drug therapy: Secondary | ICD-10-CM | POA: Diagnosis not present

## 2019-01-15 DIAGNOSIS — D509 Iron deficiency anemia, unspecified: Secondary | ICD-10-CM | POA: Diagnosis not present

## 2019-01-15 DIAGNOSIS — Z4931 Encounter for adequacy testing for hemodialysis: Secondary | ICD-10-CM | POA: Diagnosis not present

## 2019-01-15 DIAGNOSIS — N186 End stage renal disease: Secondary | ICD-10-CM | POA: Diagnosis not present

## 2019-01-18 DIAGNOSIS — D509 Iron deficiency anemia, unspecified: Secondary | ICD-10-CM | POA: Diagnosis not present

## 2019-01-18 DIAGNOSIS — Z992 Dependence on renal dialysis: Secondary | ICD-10-CM | POA: Diagnosis not present

## 2019-01-18 DIAGNOSIS — Z4931 Encounter for adequacy testing for hemodialysis: Secondary | ICD-10-CM | POA: Diagnosis not present

## 2019-01-18 DIAGNOSIS — R17 Unspecified jaundice: Secondary | ICD-10-CM | POA: Diagnosis not present

## 2019-01-18 DIAGNOSIS — N186 End stage renal disease: Secondary | ICD-10-CM | POA: Diagnosis not present

## 2019-01-18 DIAGNOSIS — Z79899 Other long term (current) drug therapy: Secondary | ICD-10-CM | POA: Diagnosis not present

## 2019-01-19 DIAGNOSIS — R17 Unspecified jaundice: Secondary | ICD-10-CM | POA: Diagnosis not present

## 2019-01-19 DIAGNOSIS — Z992 Dependence on renal dialysis: Secondary | ICD-10-CM | POA: Diagnosis not present

## 2019-01-19 DIAGNOSIS — N186 End stage renal disease: Secondary | ICD-10-CM | POA: Diagnosis not present

## 2019-01-19 DIAGNOSIS — Z4931 Encounter for adequacy testing for hemodialysis: Secondary | ICD-10-CM | POA: Diagnosis not present

## 2019-01-19 DIAGNOSIS — Z79899 Other long term (current) drug therapy: Secondary | ICD-10-CM | POA: Diagnosis not present

## 2019-01-19 DIAGNOSIS — D509 Iron deficiency anemia, unspecified: Secondary | ICD-10-CM | POA: Diagnosis not present

## 2019-01-21 DIAGNOSIS — Z4931 Encounter for adequacy testing for hemodialysis: Secondary | ICD-10-CM | POA: Diagnosis not present

## 2019-01-21 DIAGNOSIS — N186 End stage renal disease: Secondary | ICD-10-CM | POA: Diagnosis not present

## 2019-01-21 DIAGNOSIS — D509 Iron deficiency anemia, unspecified: Secondary | ICD-10-CM | POA: Diagnosis not present

## 2019-01-21 DIAGNOSIS — Z79899 Other long term (current) drug therapy: Secondary | ICD-10-CM | POA: Diagnosis not present

## 2019-01-21 DIAGNOSIS — Z992 Dependence on renal dialysis: Secondary | ICD-10-CM | POA: Diagnosis not present

## 2019-01-21 DIAGNOSIS — R17 Unspecified jaundice: Secondary | ICD-10-CM | POA: Diagnosis not present

## 2019-01-24 DIAGNOSIS — Z992 Dependence on renal dialysis: Secondary | ICD-10-CM | POA: Diagnosis not present

## 2019-01-24 DIAGNOSIS — N186 End stage renal disease: Secondary | ICD-10-CM | POA: Diagnosis not present

## 2019-01-24 DIAGNOSIS — D509 Iron deficiency anemia, unspecified: Secondary | ICD-10-CM | POA: Diagnosis not present

## 2019-01-24 DIAGNOSIS — R17 Unspecified jaundice: Secondary | ICD-10-CM | POA: Diagnosis not present

## 2019-01-24 DIAGNOSIS — Z79899 Other long term (current) drug therapy: Secondary | ICD-10-CM | POA: Diagnosis not present

## 2019-01-24 DIAGNOSIS — Z4931 Encounter for adequacy testing for hemodialysis: Secondary | ICD-10-CM | POA: Diagnosis not present

## 2019-01-25 DIAGNOSIS — Z992 Dependence on renal dialysis: Secondary | ICD-10-CM | POA: Diagnosis not present

## 2019-01-25 DIAGNOSIS — D509 Iron deficiency anemia, unspecified: Secondary | ICD-10-CM | POA: Diagnosis not present

## 2019-01-25 DIAGNOSIS — Z4931 Encounter for adequacy testing for hemodialysis: Secondary | ICD-10-CM | POA: Diagnosis not present

## 2019-01-25 DIAGNOSIS — R17 Unspecified jaundice: Secondary | ICD-10-CM | POA: Diagnosis not present

## 2019-01-25 DIAGNOSIS — N186 End stage renal disease: Secondary | ICD-10-CM | POA: Diagnosis not present

## 2019-01-25 DIAGNOSIS — Z79899 Other long term (current) drug therapy: Secondary | ICD-10-CM | POA: Diagnosis not present

## 2019-01-26 DIAGNOSIS — Z992 Dependence on renal dialysis: Secondary | ICD-10-CM | POA: Diagnosis not present

## 2019-01-26 DIAGNOSIS — D509 Iron deficiency anemia, unspecified: Secondary | ICD-10-CM | POA: Diagnosis not present

## 2019-01-26 DIAGNOSIS — Z79899 Other long term (current) drug therapy: Secondary | ICD-10-CM | POA: Diagnosis not present

## 2019-01-26 DIAGNOSIS — Z4931 Encounter for adequacy testing for hemodialysis: Secondary | ICD-10-CM | POA: Diagnosis not present

## 2019-01-26 DIAGNOSIS — N186 End stage renal disease: Secondary | ICD-10-CM | POA: Diagnosis not present

## 2019-01-26 DIAGNOSIS — R17 Unspecified jaundice: Secondary | ICD-10-CM | POA: Diagnosis not present

## 2019-01-28 DIAGNOSIS — R17 Unspecified jaundice: Secondary | ICD-10-CM | POA: Diagnosis not present

## 2019-01-28 DIAGNOSIS — Z4931 Encounter for adequacy testing for hemodialysis: Secondary | ICD-10-CM | POA: Diagnosis not present

## 2019-01-28 DIAGNOSIS — Z992 Dependence on renal dialysis: Secondary | ICD-10-CM | POA: Diagnosis not present

## 2019-01-28 DIAGNOSIS — Z79899 Other long term (current) drug therapy: Secondary | ICD-10-CM | POA: Diagnosis not present

## 2019-01-28 DIAGNOSIS — D509 Iron deficiency anemia, unspecified: Secondary | ICD-10-CM | POA: Diagnosis not present

## 2019-01-28 DIAGNOSIS — N186 End stage renal disease: Secondary | ICD-10-CM | POA: Diagnosis not present

## 2019-01-29 DIAGNOSIS — N186 End stage renal disease: Secondary | ICD-10-CM | POA: Diagnosis not present

## 2019-01-29 DIAGNOSIS — Z79899 Other long term (current) drug therapy: Secondary | ICD-10-CM | POA: Diagnosis not present

## 2019-01-29 DIAGNOSIS — D509 Iron deficiency anemia, unspecified: Secondary | ICD-10-CM | POA: Diagnosis not present

## 2019-01-29 DIAGNOSIS — Z4931 Encounter for adequacy testing for hemodialysis: Secondary | ICD-10-CM | POA: Diagnosis not present

## 2019-01-29 DIAGNOSIS — R17 Unspecified jaundice: Secondary | ICD-10-CM | POA: Diagnosis not present

## 2019-01-29 DIAGNOSIS — Z992 Dependence on renal dialysis: Secondary | ICD-10-CM | POA: Diagnosis not present

## 2019-02-01 DIAGNOSIS — Z79899 Other long term (current) drug therapy: Secondary | ICD-10-CM | POA: Diagnosis not present

## 2019-02-01 DIAGNOSIS — N186 End stage renal disease: Secondary | ICD-10-CM | POA: Diagnosis not present

## 2019-02-01 DIAGNOSIS — R17 Unspecified jaundice: Secondary | ICD-10-CM | POA: Diagnosis not present

## 2019-02-01 DIAGNOSIS — Z992 Dependence on renal dialysis: Secondary | ICD-10-CM | POA: Diagnosis not present

## 2019-02-01 DIAGNOSIS — D509 Iron deficiency anemia, unspecified: Secondary | ICD-10-CM | POA: Diagnosis not present

## 2019-02-01 DIAGNOSIS — Z4931 Encounter for adequacy testing for hemodialysis: Secondary | ICD-10-CM | POA: Diagnosis not present

## 2019-02-02 DIAGNOSIS — Z992 Dependence on renal dialysis: Secondary | ICD-10-CM | POA: Diagnosis not present

## 2019-02-02 DIAGNOSIS — N186 End stage renal disease: Secondary | ICD-10-CM | POA: Diagnosis not present

## 2019-02-02 DIAGNOSIS — R17 Unspecified jaundice: Secondary | ICD-10-CM | POA: Diagnosis not present

## 2019-02-02 DIAGNOSIS — Z79899 Other long term (current) drug therapy: Secondary | ICD-10-CM | POA: Diagnosis not present

## 2019-02-02 DIAGNOSIS — Z4931 Encounter for adequacy testing for hemodialysis: Secondary | ICD-10-CM | POA: Diagnosis not present

## 2019-02-02 DIAGNOSIS — D509 Iron deficiency anemia, unspecified: Secondary | ICD-10-CM | POA: Diagnosis not present

## 2019-02-04 DIAGNOSIS — Z992 Dependence on renal dialysis: Secondary | ICD-10-CM | POA: Diagnosis not present

## 2019-02-04 DIAGNOSIS — Z79899 Other long term (current) drug therapy: Secondary | ICD-10-CM | POA: Diagnosis not present

## 2019-02-04 DIAGNOSIS — N186 End stage renal disease: Secondary | ICD-10-CM | POA: Diagnosis not present

## 2019-02-04 DIAGNOSIS — Z4931 Encounter for adequacy testing for hemodialysis: Secondary | ICD-10-CM | POA: Diagnosis not present

## 2019-02-04 DIAGNOSIS — D509 Iron deficiency anemia, unspecified: Secondary | ICD-10-CM | POA: Diagnosis not present

## 2019-02-04 DIAGNOSIS — R17 Unspecified jaundice: Secondary | ICD-10-CM | POA: Diagnosis not present

## 2019-02-05 DIAGNOSIS — N186 End stage renal disease: Secondary | ICD-10-CM | POA: Diagnosis not present

## 2019-02-05 DIAGNOSIS — D509 Iron deficiency anemia, unspecified: Secondary | ICD-10-CM | POA: Diagnosis not present

## 2019-02-05 DIAGNOSIS — R17 Unspecified jaundice: Secondary | ICD-10-CM | POA: Diagnosis not present

## 2019-02-05 DIAGNOSIS — Z4931 Encounter for adequacy testing for hemodialysis: Secondary | ICD-10-CM | POA: Diagnosis not present

## 2019-02-05 DIAGNOSIS — Z79899 Other long term (current) drug therapy: Secondary | ICD-10-CM | POA: Diagnosis not present

## 2019-02-05 DIAGNOSIS — Z992 Dependence on renal dialysis: Secondary | ICD-10-CM | POA: Diagnosis not present

## 2019-02-08 DIAGNOSIS — N186 End stage renal disease: Secondary | ICD-10-CM | POA: Diagnosis not present

## 2019-02-08 DIAGNOSIS — Z992 Dependence on renal dialysis: Secondary | ICD-10-CM | POA: Diagnosis not present

## 2019-02-08 DIAGNOSIS — D509 Iron deficiency anemia, unspecified: Secondary | ICD-10-CM | POA: Diagnosis not present

## 2019-02-08 DIAGNOSIS — Z4931 Encounter for adequacy testing for hemodialysis: Secondary | ICD-10-CM | POA: Diagnosis not present

## 2019-02-08 DIAGNOSIS — Z79899 Other long term (current) drug therapy: Secondary | ICD-10-CM | POA: Diagnosis not present

## 2019-02-08 DIAGNOSIS — R17 Unspecified jaundice: Secondary | ICD-10-CM | POA: Diagnosis not present

## 2019-02-09 DIAGNOSIS — Z79899 Other long term (current) drug therapy: Secondary | ICD-10-CM | POA: Diagnosis not present

## 2019-02-09 DIAGNOSIS — Z4931 Encounter for adequacy testing for hemodialysis: Secondary | ICD-10-CM | POA: Diagnosis not present

## 2019-02-09 DIAGNOSIS — Z992 Dependence on renal dialysis: Secondary | ICD-10-CM | POA: Diagnosis not present

## 2019-02-09 DIAGNOSIS — D509 Iron deficiency anemia, unspecified: Secondary | ICD-10-CM | POA: Diagnosis not present

## 2019-02-09 DIAGNOSIS — R17 Unspecified jaundice: Secondary | ICD-10-CM | POA: Diagnosis not present

## 2019-02-09 DIAGNOSIS — N186 End stage renal disease: Secondary | ICD-10-CM | POA: Diagnosis not present

## 2019-02-11 DIAGNOSIS — Z992 Dependence on renal dialysis: Secondary | ICD-10-CM | POA: Diagnosis not present

## 2019-02-11 DIAGNOSIS — D509 Iron deficiency anemia, unspecified: Secondary | ICD-10-CM | POA: Diagnosis not present

## 2019-02-11 DIAGNOSIS — R17 Unspecified jaundice: Secondary | ICD-10-CM | POA: Diagnosis not present

## 2019-02-11 DIAGNOSIS — Z4931 Encounter for adequacy testing for hemodialysis: Secondary | ICD-10-CM | POA: Diagnosis not present

## 2019-02-11 DIAGNOSIS — N186 End stage renal disease: Secondary | ICD-10-CM | POA: Diagnosis not present

## 2019-02-11 DIAGNOSIS — Z79899 Other long term (current) drug therapy: Secondary | ICD-10-CM | POA: Diagnosis not present

## 2019-02-12 DIAGNOSIS — N2589 Other disorders resulting from impaired renal tubular function: Secondary | ICD-10-CM | POA: Diagnosis not present

## 2019-02-12 DIAGNOSIS — E8779 Other fluid overload: Secondary | ICD-10-CM | POA: Diagnosis not present

## 2019-02-12 DIAGNOSIS — N186 End stage renal disease: Secondary | ICD-10-CM | POA: Diagnosis not present

## 2019-02-12 DIAGNOSIS — K7689 Other specified diseases of liver: Secondary | ICD-10-CM | POA: Diagnosis not present

## 2019-02-12 DIAGNOSIS — R509 Fever, unspecified: Secondary | ICD-10-CM | POA: Diagnosis not present

## 2019-02-12 DIAGNOSIS — Z4931 Encounter for adequacy testing for hemodialysis: Secondary | ICD-10-CM | POA: Diagnosis not present

## 2019-02-12 DIAGNOSIS — N2581 Secondary hyperparathyroidism of renal origin: Secondary | ICD-10-CM | POA: Diagnosis not present

## 2019-02-12 DIAGNOSIS — Z992 Dependence on renal dialysis: Secondary | ICD-10-CM | POA: Diagnosis not present

## 2019-02-12 DIAGNOSIS — E1022 Type 1 diabetes mellitus with diabetic chronic kidney disease: Secondary | ICD-10-CM | POA: Diagnosis not present

## 2019-02-12 DIAGNOSIS — D631 Anemia in chronic kidney disease: Secondary | ICD-10-CM | POA: Diagnosis not present

## 2019-02-12 DIAGNOSIS — I120 Hypertensive chronic kidney disease with stage 5 chronic kidney disease or end stage renal disease: Secondary | ICD-10-CM | POA: Diagnosis not present

## 2019-02-15 DIAGNOSIS — N2589 Other disorders resulting from impaired renal tubular function: Secondary | ICD-10-CM | POA: Diagnosis not present

## 2019-02-15 DIAGNOSIS — K7689 Other specified diseases of liver: Secondary | ICD-10-CM | POA: Diagnosis not present

## 2019-02-15 DIAGNOSIS — N186 End stage renal disease: Secondary | ICD-10-CM | POA: Diagnosis not present

## 2019-02-15 DIAGNOSIS — N2581 Secondary hyperparathyroidism of renal origin: Secondary | ICD-10-CM | POA: Diagnosis not present

## 2019-02-15 DIAGNOSIS — Z992 Dependence on renal dialysis: Secondary | ICD-10-CM | POA: Diagnosis not present

## 2019-02-15 DIAGNOSIS — Z4931 Encounter for adequacy testing for hemodialysis: Secondary | ICD-10-CM | POA: Diagnosis not present

## 2019-02-16 DIAGNOSIS — K7689 Other specified diseases of liver: Secondary | ICD-10-CM | POA: Diagnosis not present

## 2019-02-16 DIAGNOSIS — Z992 Dependence on renal dialysis: Secondary | ICD-10-CM | POA: Diagnosis not present

## 2019-02-16 DIAGNOSIS — N2589 Other disorders resulting from impaired renal tubular function: Secondary | ICD-10-CM | POA: Diagnosis not present

## 2019-02-16 DIAGNOSIS — Z4931 Encounter for adequacy testing for hemodialysis: Secondary | ICD-10-CM | POA: Diagnosis not present

## 2019-02-16 DIAGNOSIS — N2581 Secondary hyperparathyroidism of renal origin: Secondary | ICD-10-CM | POA: Diagnosis not present

## 2019-02-16 DIAGNOSIS — N186 End stage renal disease: Secondary | ICD-10-CM | POA: Diagnosis not present

## 2019-02-18 DIAGNOSIS — E7849 Other hyperlipidemia: Secondary | ICD-10-CM | POA: Diagnosis not present

## 2019-02-18 DIAGNOSIS — K7689 Other specified diseases of liver: Secondary | ICD-10-CM | POA: Diagnosis not present

## 2019-02-18 DIAGNOSIS — Z4931 Encounter for adequacy testing for hemodialysis: Secondary | ICD-10-CM | POA: Diagnosis not present

## 2019-02-18 DIAGNOSIS — N2589 Other disorders resulting from impaired renal tubular function: Secondary | ICD-10-CM | POA: Diagnosis not present

## 2019-02-18 DIAGNOSIS — N2581 Secondary hyperparathyroidism of renal origin: Secondary | ICD-10-CM | POA: Diagnosis not present

## 2019-02-18 DIAGNOSIS — N186 End stage renal disease: Secondary | ICD-10-CM | POA: Diagnosis not present

## 2019-02-18 DIAGNOSIS — Z992 Dependence on renal dialysis: Secondary | ICD-10-CM | POA: Diagnosis not present

## 2019-02-18 DIAGNOSIS — E1022 Type 1 diabetes mellitus with diabetic chronic kidney disease: Secondary | ICD-10-CM | POA: Diagnosis not present

## 2019-02-19 DIAGNOSIS — N2581 Secondary hyperparathyroidism of renal origin: Secondary | ICD-10-CM | POA: Diagnosis not present

## 2019-02-19 DIAGNOSIS — N186 End stage renal disease: Secondary | ICD-10-CM | POA: Diagnosis not present

## 2019-02-19 DIAGNOSIS — N2589 Other disorders resulting from impaired renal tubular function: Secondary | ICD-10-CM | POA: Diagnosis not present

## 2019-02-19 DIAGNOSIS — K7689 Other specified diseases of liver: Secondary | ICD-10-CM | POA: Diagnosis not present

## 2019-02-19 DIAGNOSIS — Z4931 Encounter for adequacy testing for hemodialysis: Secondary | ICD-10-CM | POA: Diagnosis not present

## 2019-02-19 DIAGNOSIS — Z992 Dependence on renal dialysis: Secondary | ICD-10-CM | POA: Diagnosis not present

## 2019-02-22 ENCOUNTER — Ambulatory Visit: Payer: Self-pay | Admitting: Orthopedic Surgery

## 2019-02-22 DIAGNOSIS — N2581 Secondary hyperparathyroidism of renal origin: Secondary | ICD-10-CM | POA: Diagnosis not present

## 2019-02-22 DIAGNOSIS — N2589 Other disorders resulting from impaired renal tubular function: Secondary | ICD-10-CM | POA: Diagnosis not present

## 2019-02-22 DIAGNOSIS — Z992 Dependence on renal dialysis: Secondary | ICD-10-CM | POA: Diagnosis not present

## 2019-02-22 DIAGNOSIS — Z4931 Encounter for adequacy testing for hemodialysis: Secondary | ICD-10-CM | POA: Diagnosis not present

## 2019-02-22 DIAGNOSIS — N186 End stage renal disease: Secondary | ICD-10-CM | POA: Diagnosis not present

## 2019-02-22 DIAGNOSIS — K7689 Other specified diseases of liver: Secondary | ICD-10-CM | POA: Diagnosis not present

## 2019-02-23 DIAGNOSIS — Z4931 Encounter for adequacy testing for hemodialysis: Secondary | ICD-10-CM | POA: Diagnosis not present

## 2019-02-23 DIAGNOSIS — K7689 Other specified diseases of liver: Secondary | ICD-10-CM | POA: Diagnosis not present

## 2019-02-23 DIAGNOSIS — N186 End stage renal disease: Secondary | ICD-10-CM | POA: Diagnosis not present

## 2019-02-23 DIAGNOSIS — N2589 Other disorders resulting from impaired renal tubular function: Secondary | ICD-10-CM | POA: Diagnosis not present

## 2019-02-23 DIAGNOSIS — Z992 Dependence on renal dialysis: Secondary | ICD-10-CM | POA: Diagnosis not present

## 2019-02-23 DIAGNOSIS — N2581 Secondary hyperparathyroidism of renal origin: Secondary | ICD-10-CM | POA: Diagnosis not present

## 2019-02-24 DIAGNOSIS — T80319A ABO incompatibility with hemolytic transfusion reaction, unspecified, initial encounter: Secondary | ICD-10-CM | POA: Diagnosis not present

## 2019-02-25 DIAGNOSIS — Z4931 Encounter for adequacy testing for hemodialysis: Secondary | ICD-10-CM | POA: Diagnosis not present

## 2019-02-25 DIAGNOSIS — N2589 Other disorders resulting from impaired renal tubular function: Secondary | ICD-10-CM | POA: Diagnosis not present

## 2019-02-25 DIAGNOSIS — K7689 Other specified diseases of liver: Secondary | ICD-10-CM | POA: Diagnosis not present

## 2019-02-25 DIAGNOSIS — N186 End stage renal disease: Secondary | ICD-10-CM | POA: Diagnosis not present

## 2019-02-25 DIAGNOSIS — N2581 Secondary hyperparathyroidism of renal origin: Secondary | ICD-10-CM | POA: Diagnosis not present

## 2019-02-25 DIAGNOSIS — Z992 Dependence on renal dialysis: Secondary | ICD-10-CM | POA: Diagnosis not present

## 2019-02-26 ENCOUNTER — Ambulatory Visit: Payer: Medicare Other | Admitting: "Endocrinology

## 2019-02-26 DIAGNOSIS — Z4931 Encounter for adequacy testing for hemodialysis: Secondary | ICD-10-CM | POA: Diagnosis not present

## 2019-02-26 DIAGNOSIS — N2589 Other disorders resulting from impaired renal tubular function: Secondary | ICD-10-CM | POA: Diagnosis not present

## 2019-02-26 DIAGNOSIS — N2581 Secondary hyperparathyroidism of renal origin: Secondary | ICD-10-CM | POA: Diagnosis not present

## 2019-02-26 DIAGNOSIS — Z992 Dependence on renal dialysis: Secondary | ICD-10-CM | POA: Diagnosis not present

## 2019-02-26 DIAGNOSIS — N186 End stage renal disease: Secondary | ICD-10-CM | POA: Diagnosis not present

## 2019-02-26 DIAGNOSIS — K7689 Other specified diseases of liver: Secondary | ICD-10-CM | POA: Diagnosis not present

## 2019-03-01 DIAGNOSIS — K7689 Other specified diseases of liver: Secondary | ICD-10-CM | POA: Diagnosis not present

## 2019-03-01 DIAGNOSIS — Z4931 Encounter for adequacy testing for hemodialysis: Secondary | ICD-10-CM | POA: Diagnosis not present

## 2019-03-01 DIAGNOSIS — N2589 Other disorders resulting from impaired renal tubular function: Secondary | ICD-10-CM | POA: Diagnosis not present

## 2019-03-01 DIAGNOSIS — N2581 Secondary hyperparathyroidism of renal origin: Secondary | ICD-10-CM | POA: Diagnosis not present

## 2019-03-01 DIAGNOSIS — Z992 Dependence on renal dialysis: Secondary | ICD-10-CM | POA: Diagnosis not present

## 2019-03-01 DIAGNOSIS — N186 End stage renal disease: Secondary | ICD-10-CM | POA: Diagnosis not present

## 2019-03-02 DIAGNOSIS — Z992 Dependence on renal dialysis: Secondary | ICD-10-CM | POA: Diagnosis not present

## 2019-03-02 DIAGNOSIS — N2581 Secondary hyperparathyroidism of renal origin: Secondary | ICD-10-CM | POA: Diagnosis not present

## 2019-03-02 DIAGNOSIS — Z4931 Encounter for adequacy testing for hemodialysis: Secondary | ICD-10-CM | POA: Diagnosis not present

## 2019-03-02 DIAGNOSIS — N186 End stage renal disease: Secondary | ICD-10-CM | POA: Diagnosis not present

## 2019-03-02 DIAGNOSIS — N2589 Other disorders resulting from impaired renal tubular function: Secondary | ICD-10-CM | POA: Diagnosis not present

## 2019-03-02 DIAGNOSIS — K7689 Other specified diseases of liver: Secondary | ICD-10-CM | POA: Diagnosis not present

## 2019-03-04 DIAGNOSIS — Z992 Dependence on renal dialysis: Secondary | ICD-10-CM | POA: Diagnosis not present

## 2019-03-04 DIAGNOSIS — N2581 Secondary hyperparathyroidism of renal origin: Secondary | ICD-10-CM | POA: Diagnosis not present

## 2019-03-04 DIAGNOSIS — K7689 Other specified diseases of liver: Secondary | ICD-10-CM | POA: Diagnosis not present

## 2019-03-04 DIAGNOSIS — N186 End stage renal disease: Secondary | ICD-10-CM | POA: Diagnosis not present

## 2019-03-04 DIAGNOSIS — Z4931 Encounter for adequacy testing for hemodialysis: Secondary | ICD-10-CM | POA: Diagnosis not present

## 2019-03-04 DIAGNOSIS — N2589 Other disorders resulting from impaired renal tubular function: Secondary | ICD-10-CM | POA: Diagnosis not present

## 2019-03-05 ENCOUNTER — Ambulatory Visit (INDEPENDENT_AMBULATORY_CARE_PROVIDER_SITE_OTHER): Payer: Medicare Other | Admitting: "Endocrinology

## 2019-03-05 ENCOUNTER — Encounter: Payer: Self-pay | Admitting: "Endocrinology

## 2019-03-05 ENCOUNTER — Other Ambulatory Visit: Payer: Self-pay

## 2019-03-05 VITALS — BP 136/82 | HR 96 | Ht 75.0 in | Wt 238.4 lb

## 2019-03-05 DIAGNOSIS — E1022 Type 1 diabetes mellitus with diabetic chronic kidney disease: Secondary | ICD-10-CM

## 2019-03-05 DIAGNOSIS — N2581 Secondary hyperparathyroidism of renal origin: Secondary | ICD-10-CM | POA: Diagnosis not present

## 2019-03-05 DIAGNOSIS — E782 Mixed hyperlipidemia: Secondary | ICD-10-CM | POA: Diagnosis not present

## 2019-03-05 DIAGNOSIS — K7689 Other specified diseases of liver: Secondary | ICD-10-CM | POA: Diagnosis not present

## 2019-03-05 DIAGNOSIS — Z4931 Encounter for adequacy testing for hemodialysis: Secondary | ICD-10-CM | POA: Diagnosis not present

## 2019-03-05 DIAGNOSIS — N2589 Other disorders resulting from impaired renal tubular function: Secondary | ICD-10-CM | POA: Diagnosis not present

## 2019-03-05 DIAGNOSIS — Z992 Dependence on renal dialysis: Secondary | ICD-10-CM | POA: Diagnosis not present

## 2019-03-05 DIAGNOSIS — N186 End stage renal disease: Secondary | ICD-10-CM

## 2019-03-05 DIAGNOSIS — I10 Essential (primary) hypertension: Secondary | ICD-10-CM

## 2019-03-05 LAB — POCT GLYCOSYLATED HEMOGLOBIN (HGB A1C): Hemoglobin A1C: 10.1 % — AB (ref 4.0–5.6)

## 2019-03-05 MED ORDER — INSULIN LISPRO (1 UNIT DIAL) 100 UNIT/ML (KWIKPEN): 14.0000 [IU] | PEN_INJECTOR | Freq: Three times a day (TID) | SUBCUTANEOUS | 1 refills | Status: AC

## 2019-03-05 MED ORDER — LANTUS SOLOSTAR 100 UNIT/ML ~~LOC~~ SOPN: 30.0000 [IU] | PEN_INJECTOR | Freq: Every day | SUBCUTANEOUS | 1 refills | Status: AC

## 2019-03-05 NOTE — Patient Instructions (Signed)

## 2019-03-05 NOTE — Progress Notes (Signed)
03/05/2019, 2:06 PM                                                    Endocrinology Telehealth Visit Follow up Note -During COVID -19 Pandemic  This visit type was conducted due to national recommendations for restrictions regarding the COVID-19 Pandemic  in an effort to limit this patient's exposure and mitigate transmission of the corona virus.  Due to his co-morbid illnesses, Lance [name] is at  moderate to high risk for complications without adequate follow up.  This format is felt to be most appropriate for him at this time.  I connected with this patient on 03/05/2019   by telephone and verified that I am speaking with the correct person using two identifiers. Lance [name], 1971/01/22. he has verbally consented to this visit. All issues noted in this document were discussed and addressed. The format was not optimal for physical exam.    Subjective:    Patient ID: Lance [name], male    DOB: [birth year].  Lance [name] is being engaged in telehealth in follow-up  for management of currently uncontrolled, complicated, symptomatic type 1 diabetes, hyperlipidemia, hypertension. PMD:   [name], MD.   Past Medical History:  Diagnosis Date  . Aortic stenosis    Very mild in 05/2007  . Arthritis    "right shoulder; right knee; feel like I've got it all over" (11/28/2015)  . CHF (congestive heart failure) ([place])   . Degenerative joint disease    Left TKA  . Diastolic heart failure (HCC)    LVEF 65-70% with grade 2 diastolic dysfunction  . ESRD (end stage renal disease) on dialysis [facility])    (as of 09/24/2017) pt does HD at home on a M/Tu/Th/F schedule.  . Essential hypertension, benign 2000   LVH  . GERD (gastroesophageal reflux disease)   . HCAP (healthcare-associated pneumonia) 11/28/2015  . Hyperlipidemia 2000  . Loculated pleural effusion 11/28/2015   [name] 11/28/2015  . Pneumonia 12/2013; 05/2014  . PSVT  (paroxysmal supraventricular tachycardia) (HCC)    Nonsustained; asymptomatic; diagnosed by event recorder in 2006  . Tobacco abuse, in remission    20 pack years; discontinued in 1985; bronchitic changes on chest x-ray and 2009  . Type 1 diabetes mellitus ([place]) 1990   Past Surgical History:  Procedure Laterality Date  . AMPUTATION Right 06/06/2017   Procedure: RIGHT BELOW KNEE AMPUTATION;  Surgeon: [name], MD;  Location: [place];  Service: Orthopedics;  Laterality: Right;  . APPLICATION OF WOUND VAC Right 07/25/2017   Procedure: APPLICATION OF WOUND VAC;  Surgeon: [name], MD;  Location: [place];  Service: Orthopedics;  Laterality: Right;  . AV FISTULA PLACEMENT Right 06/23/2014   Procedure: ARTERIOVENOUS (AV) FISTULA CREATION;  Surgeon: Angelia Mould, MD;  Location: [place];  Service: Vascular;  Laterality: Right;  . CARDIAC CATHETERIZATION     [facility]- evaluation for Kidney/ pancreas transplant  . EYE SURGERY Bilateral    "for glaucoma"  . INGUINAL HERNIA REPAIR Right    As a child  . IR DIALY SHUNT INTRO NEEDLE/INTRACATH INITIAL W/IMG RIGHT Right 09/29/2017  . LOWER EXTREMITY ANGIOGRAPHY N/A 05/27/2017   Procedure: LOWER EXTREMITY ANGIOGRAPHY;  Surgeon: [name], MD;  Location: [employer] CV [facility];  Service: Cardiovascular;  Laterality: N/A;  . STUMP REVISION Right 07/25/2017   Procedure: REVISION RIGHT BELOW KNEE AMPUTATION;;  Surgeon: [name], MD;  Location: [place];  Service: Orthopedics;  Laterality: Right;  . WISDOM TOOTH EXTRACTION  1987   Social History   Socioeconomic History  . Marital status: Married    Spouse name: Not on file  . Number of children: Not on file  . Years of education: Not on file  . Highest education level: Not on file  Occupational History  . Not on file  Tobacco Use  . Smoking status: Never Smoker  . Smokeless tobacco: Never Used  Substance and Sexual Activity  . Alcohol use: Not Currently    Comment: 11/28/2015 "used to  drink; stopped ~ 2 yr ago"  . Drug use: No  . Sexual activity: Yes  Other Topics Concern  . Not on file  Social History Narrative  . Not on file   Social Determinants of Health   Financial Resource Strain:   . Difficulty of Paying Living Expenses: Not on file  Food Insecurity:   . Worried About Charity fundraiser in the Last Year: Not on file  . Ran Out of Food in the Last Year: Not on file  Transportation Needs:   . Lack of Transportation (Medical): Not on file  . Lack of Transportation (Non-Medical): Not on file  Physical Activity:   . Days of Exercise per Week: Not on file  . Minutes of Exercise per Session: Not on file  Stress:   . Feeling of Stress : Not on file  Social Connections:   . Frequency of Communication with Friends and Family: Not on file  . Frequency of Social Gatherings with Friends and Family: Not on file  . Attends Religious Services: Not on file  . Active Member of Clubs or Organizations: Not on file  . Attends Archivist Meetings: Not on file  . Marital Status: Not on file   Outpatient Encounter Medications as of 03/05/2019  Medication Sig  . amLODipine (NORVASC) 5 MG tablet Take 5 mg by mouth daily.   Marland Kitchen aspirin EC 81 MG tablet Take 81 mg by mouth daily.  . calcitRIOL (ROCALTROL) 0.25 MCG capsule Take 1 capsule (0.25 mcg total) by mouth every Monday, Wednesday, and Friday with hemodialysis. (Patient taking differently: Take 0.5 mcg by mouth See admin instructions. Take 0.25 mcg by mouth once a day on Mon/Wed/Fri with home dialysis)  . Continuous Blood Gluc Receiver (FREESTYLE LIBRE 2 READER SYSTM) DEVI 1 Piece by Does not apply route as directed.  . Continuous Blood Gluc Sensor (FREESTYLE LIBRE 14 DAY SENSOR) MISC Inject 1 each into the skin every 14 (fourteen) days. Use as directed.  . doxycycline (VIBRAMYCIN) 100 MG capsule Take 1 capsule (100 mg total) by mouth 2 (two) times daily.  Marland Kitchen glucose blood (ACCU-CHEK GUIDE) test strip Use as  instructed 4 x daily. E11.65  . Insulin Glargine (LANTUS SOLOSTAR) 100 UNIT/ML Solostar Pen Inject 30 Units into the skin at bedtime.  . insulin lispro (HUMALOG) 100 UNIT/ML KwikPen Inject 0.14-0.17 mLs (14-17 Units total) into the skin 3 (three) times daily before meals.  . metoprolol succinate (TOPROL-XL) 25 MG 24 hr tablet Take 25 mg by mouth every evening.   . nitroGLYCERIN (NITROSTAT) 0.4 MG SL tablet Place 0.4 mg under the tongue every 5 (five) minutes as needed for chest pain.   Marland Kitchen  pantoprazole (PROTONIX) 40 MG tablet TAKE 1 TABLET BY MOUTH EVERY DAY (Patient taking differently: Take 40 mg by mouth daily. )  . pravastatin (PRAVACHOL) 10 MG tablet Take 10 mg by mouth every evening.   . sucroferric oxyhydroxide (VELPHORO) 500 MG chewable tablet Chew 3 tablets (1,500 mg total) by mouth 3 (three) times daily.  . ticagrelor (BRILINTA) 90 MG TABS tablet Take 90 mg by mouth 2 (two) times daily.   . [DISCONTINUED] Insulin Glargine (LANTUS SOLOSTAR) 100 UNIT/ML Solostar Pen Inject 34 Units into the skin daily.  . [DISCONTINUED] insulin lispro (HUMALOG) 100 UNIT/ML KwikPen INJECT 15 TO 21 UNITS INTO THE SKIN THREE TIMES DAILY BEFORE MEALS   No facility-administered encounter medications on file as of 03/05/2019.    ALLERGIES: Allergies  Allergen Reactions  . Atorvastatin Other (See Comments)    Muscle pain   . Minoxidil Other (See Comments)    Fluid retention   . Adhesive [Tape] Other (See Comments)    MUST USE "SILK TAPE," AS ANY OTHER "BURNS" THE SKIN  . Statins Other (See Comments)    Muscle pain    VACCINATION STATUS: Immunization History  Administered Date(s) Administered  . Influenza,inj,Quad PF,6+ Mos 05/06/2014  . Pneumococcal Polysaccharide-23 05/06/2014    Diabetes He presents for his follow-up diabetic visit. He has type 1 diabetes mellitus. Onset time: He was diagnosed at approximate age of 26 years with type 1 diabetes. His disease course has been worsening (Since his  last visit, he was hospitalized for cardiac arrest which reportedly happened because of hyperkalemia from defective dialysis machine.). There are no hypoglycemic associated symptoms. Pertinent negatives for hypoglycemia include no confusion, headaches, pallor or seizures. Pertinent negatives for diabetes include no chest pain, no fatigue, no polydipsia, no polyphagia, no polyuria and no weakness. There are no hypoglycemic complications. Symptoms are worsening. Diabetic complications include nephropathy and PVD. (His diabetes is complicated by end-stage renal disease on  hemodialysis for the last 5 years.  Since last visit, he underwent right below-knee amputation due to peripheral arterial disease.  Patient is  on  Kidney transplant list.) Risk factors for coronary artery disease include diabetes mellitus, hypertension and sedentary lifestyle. Current diabetic treatment includes insulin injections. His weight is increasing steadily (Status post right BKA.). He is following a generally unhealthy diet. He has had a previous visit with a dietitian. He never participates in exercise. His home blood glucose trend is decreasing steadily. (There is persistent discrepancy between his average blood glucose and A1c.  -His point-of-care A1c today is 10.1%, however his CGM analysis shows average blood glucose of 170 mg for the last 3 months which would correspond with A1c of 7.4%.  He has 52% time range, 38% above range, and 10% hypoglycemia.  Hypoglycemic readings are mostly overnight. ) An ACE inhibitor/angiotensin II receptor blocker is being taken. Eye exam is current.  Hypertension This is a chronic problem. The current episode started more than 1 year ago. The problem is controlled. Pertinent negatives include no chest pain, headaches, neck pain, palpitations or shortness of breath. Risk factors for coronary artery disease include diabetes mellitus and sedentary lifestyle. Past treatments include ACE inhibitors.  Hypertensive end-organ damage includes kidney disease and PVD.     Review of system:   Constitutional: + weight gain, no fatigue, no subjective hyperthermia, no subjective hypothermia Eyes: no blurry vision, no xerophthalmia ENT: no sore throat, no nodules palpated in throat, no dysphagia/odynophagia, no hoarseness Cardiovascular: no Chest Pain, no Shortness of Breath, no palpitations,  no leg swelling Respiratory: no cough, no SOB Gastrointestinal: no Nausea/Vomiting/Diarhhea Musculoskeletal: no muscle/joint aches Skin: no rashes Neurological: no tremors, no numbness, no tingling, no dizziness Psychiatric: no depression, no anxiety   Objective:    BP 136/82   Pulse 96   Ht 6\' 3"  (1.905 m)   Wt 238 lb 6.4 oz (108.1 kg)   BMI 29.80 kg/m   Wt Readings from Last 3 Encounters:  03/05/19 238 lb 6.4 oz (108.1 kg)  12/01/18 230 lb (104.3 kg)  10/24/18 225 lb (102.1 kg)      Physical Exam- Limited  Constitutional:  Body mass index is 29.8 kg/m. , not in acute distress, normal state of mind Eyes:  EOMI, no exophthalmos Neck: Supple Respiratory: Adequate breathing efforts Musculoskeletal: no gross deformities, strength intact in all four extremities, no gross restriction of joint movements Skin:  no rashes, no hyperemia Neurological: no tremor with outstretched hands.  CMP     Component Value Date/Time   NA 131 (L) 12/01/2018 1248   NA 135 06/03/2018 1128   K 4.1 12/01/2018 1248   CL 92 (L) 12/01/2018 1248   CO2 27 12/01/2018 1248   GLUCOSE 343 (H) 12/01/2018 1248   BUN 50 (H) 12/01/2018 1248   BUN 35 (H) 06/03/2018 1128   CREATININE 11.25 (H) 12/01/2018 1248   CREATININE 7.99 (H) 10/14/2018 1346   CALCIUM 8.5 (L) 12/01/2018 1248   PROT 7.9 10/14/2018 1346   PROT 8.0 06/03/2018 1128   ALBUMIN 4.4 06/03/2018 1128   AST 11 10/14/2018 1346   ALT 15 10/14/2018 1346   ALKPHOS 128 (H) 06/03/2018 1128   BILITOT 0.7 10/14/2018 1346   BILITOT 0.4 06/03/2018 1128    GFRNONAA 5 (L) 12/01/2018 1248   GFRNONAA 7 (L) 10/14/2018 1346   GFRAA 5 (L) 12/01/2018 1248   GFRAA 8 (L) 10/14/2018 1346     Diabetic Labs (most recent): Lab Results  Component Value Date   HGBA1C 10.1 (A) 03/05/2019   HGBA1C 8.6 (H) 10/14/2018   HGBA1C 8.2 (H) 06/03/2018     Lipid Panel ( most recent) Lipid Panel     Component Value Date/Time   CHOL 220 (H) 06/03/2018 1128   TRIG 134 06/03/2018 1128   HDL 53 06/03/2018 1128   CHOLHDL 4.2 06/03/2018 1128   CHOLHDL 4.5 12/28/2013 0707   VLDL 46 (H) 12/28/2013 0707   LDLCALC 140 (H) 06/03/2018 1128      Lab Results  Component Value Date   TSH 0.697 01/19/2018   TSH 1.390 02/18/2017   TSH 1.760 12/26/2013   FREET4 0.98 01/19/2018   FREET4 1.00 02/18/2017     Assessment & Plan:   1. Type 1 diabetes mellitus with end-stage renal disease ([place])  - Lance [name] has currently uncontrolled symptomatic type 1 DM since  49 years of age.  -His diabetes is complicated by peripheral arterial disease status post right BKA, ESRD currently on hemodialysis, recent hospitalization for cardiac arrest due to hyperkalemia which resulted from defective dialysis machine.   There is persistent discrepancy between his average blood glucose and A1c.  -His point-of-care A1c today is 10.1%, however his CGM analysis shows average blood glucose of 170 mg for the last 3 months which would correspond with A1c of 7.4%.  He has 52% time range, 38% above range, and 10% hypoglycemia.  Hypoglycemic readings are mostly overnight.     Lance [name] remains at a high risk for more acute and chronic complications which  include CAD, CVA, retinopathy, and neuropathy. These are all discussed in detail with the patient.  - I have counseled him on diet management  by adopting a carbohydrate restricted/protein rich diet.  - he  admits there is a room for improvement in his diet and drink choices. -  Suggestion is made for him to avoid simple  carbohydrates  from his diet including Cakes, Sweet Desserts / Pastries, Ice Cream, Soda (diet and regular), Sweet Tea, Candies, Chips, Cookies, Sweet Pastries,  Store Bought Juices, Alcohol in Excess of  1-2 drinks a day, Artificial Sweeteners, Coffee Creamer, and "Sugar-free" Products. This will help patient to have stable blood glucose profile and potentially avoid unintended weight gain.   - I encouraged him to switch to  unprocessed or minimally processed complex starch and increased protein intake (animal or plant source), fruits, and vegetables.  - he is advised to stick to a routine mealtimes to eat 3 meals  a day and avoid unnecessary snacks ( to snack only to correct hypoglycemia).   - he has been scheduled with Jearld Fenton, RDN, CDE for individualized diabetes education- consult pending.  - I have approached him with the following individualized plan to manage diabetes and patient agrees:    He is approached to stay engaged, and use his freestyle libre CGM at all times.    -He has benefited from his CGM device significantly. -Given his hypoglycemia overnight, he is advised to lower his Lantus to 30  units  Nightly, readjust his Humalog to14 units 3 times a day with meals for pre-meal BG readings of 70-150mg /dl, plus patient specific correction dose for unexpected hyperglycemia above 150mg /dl, associated with strict documenting of  glucose 4 times a day-before meals and at bedtime. - Patient is warned not to take insulin without proper monitoring per orders. -Adjustment parameters are given for hypo and hyperglycemia in writing. -Patient is encouraged to call clinic for blood glucose levels less than 70 or above 300 mg /dl.  - Insulin is the only choice of therapy for him.   - Patient specific target  A1c;  LDL, HDL, Triglycerides, and  Waist Circumference were discussed in detail.  2) BP/HTN: His blood pressure is controlled to target.  He is advised to continue his current  blood pressure medications including Coreg 3.125 mg p.o. twice daily, lisinopril 40 mg p.o. Daily.   3) Lipids/HPL: Due to increasing LDL to 140 from 89, he was initiated on pravastatin, currently on 10 mg p.o. daily.  He does have history of statin intolerance.     4)  Weight/Diet: CDE Consult has been  initiated , exercise, and detailed carbohydrates information provided.  5) Chronic Care/Health Maintenance:  -he  is on ACEI medications and  is encouraged to continue to follow up with Ophthalmology, Dentist, nephrology,  Podiatrist at least yearly or according to recommendations, and advised to  stay away from smoking. I have recommended yearly flu vaccine and pneumonia vaccination at least every 5 years; moderate intensity exercise for up to 150 minutes weekly; and  sleep for at least 7 hours a day.  - I advised patient to maintain close follow up with [name], MD for primary care needs.  - Time spent on this patient care encounter:  35 min, of which > 50% was spent in  counseling and the rest reviewing his blood glucose logs , discussing his hypoglycemia and hyperglycemia episodes, reviewing his current and  previous labs / studies  ( including abstraction from  other facilities) and medications  doses and developing a  long term treatment plan and documenting his care.   Please refer to Patient Instructions for Blood Glucose Monitoring and Insulin/Medications Dosing Guide"  in media tab for additional information. Please  also refer to " Patient Self Inventory" in the Media  tab for reviewed elements of pertinent patient history.  Lance [name] participated in the discussions, expressed understanding, and voiced agreement with the above plans.  All questions were answered to his satisfaction. he is encouraged to contact clinic should he have any questions or concerns prior to his return visit.   Follow up plan: - Return in about 3 months (around 06/03/2019) for Bring Meter  and Logs- A1c in Office.  Glade Lloyd, MD [facility] [address] [place], [place] [address] Phone: [phone]  Fax: [phone]    03/05/2019, 2:06 PM  This note was partially dictated with voice recognition software. Similar sounding words can be transcribed inadequately or may not  be corrected upon review.

## 2019-03-08 DIAGNOSIS — N186 End stage renal disease: Secondary | ICD-10-CM | POA: Diagnosis not present

## 2019-03-08 DIAGNOSIS — Z4931 Encounter for adequacy testing for hemodialysis: Secondary | ICD-10-CM | POA: Diagnosis not present

## 2019-03-08 DIAGNOSIS — Z992 Dependence on renal dialysis: Secondary | ICD-10-CM | POA: Diagnosis not present

## 2019-03-08 DIAGNOSIS — N2581 Secondary hyperparathyroidism of renal origin: Secondary | ICD-10-CM | POA: Diagnosis not present

## 2019-03-08 DIAGNOSIS — K7689 Other specified diseases of liver: Secondary | ICD-10-CM | POA: Diagnosis not present

## 2019-03-08 DIAGNOSIS — N2589 Other disorders resulting from impaired renal tubular function: Secondary | ICD-10-CM | POA: Diagnosis not present

## 2019-03-09 DIAGNOSIS — N2589 Other disorders resulting from impaired renal tubular function: Secondary | ICD-10-CM | POA: Diagnosis not present

## 2019-03-09 DIAGNOSIS — N186 End stage renal disease: Secondary | ICD-10-CM | POA: Diagnosis not present

## 2019-03-09 DIAGNOSIS — Z992 Dependence on renal dialysis: Secondary | ICD-10-CM | POA: Diagnosis not present

## 2019-03-09 DIAGNOSIS — Z4931 Encounter for adequacy testing for hemodialysis: Secondary | ICD-10-CM | POA: Diagnosis not present

## 2019-03-09 DIAGNOSIS — N2581 Secondary hyperparathyroidism of renal origin: Secondary | ICD-10-CM | POA: Diagnosis not present

## 2019-03-09 DIAGNOSIS — K7689 Other specified diseases of liver: Secondary | ICD-10-CM | POA: Diagnosis not present

## 2019-03-11 DIAGNOSIS — N186 End stage renal disease: Secondary | ICD-10-CM | POA: Diagnosis not present

## 2019-03-11 DIAGNOSIS — N2589 Other disorders resulting from impaired renal tubular function: Secondary | ICD-10-CM | POA: Diagnosis not present

## 2019-03-11 DIAGNOSIS — Z992 Dependence on renal dialysis: Secondary | ICD-10-CM | POA: Diagnosis not present

## 2019-03-11 DIAGNOSIS — Z4931 Encounter for adequacy testing for hemodialysis: Secondary | ICD-10-CM | POA: Diagnosis not present

## 2019-03-11 DIAGNOSIS — N2581 Secondary hyperparathyroidism of renal origin: Secondary | ICD-10-CM | POA: Diagnosis not present

## 2019-03-11 DIAGNOSIS — K7689 Other specified diseases of liver: Secondary | ICD-10-CM | POA: Diagnosis not present

## 2019-03-12 DIAGNOSIS — K7689 Other specified diseases of liver: Secondary | ICD-10-CM | POA: Diagnosis not present

## 2019-03-12 DIAGNOSIS — N186 End stage renal disease: Secondary | ICD-10-CM | POA: Diagnosis not present

## 2019-03-12 DIAGNOSIS — N2581 Secondary hyperparathyroidism of renal origin: Secondary | ICD-10-CM | POA: Diagnosis not present

## 2019-03-12 DIAGNOSIS — Z992 Dependence on renal dialysis: Secondary | ICD-10-CM | POA: Diagnosis not present

## 2019-03-12 DIAGNOSIS — N2589 Other disorders resulting from impaired renal tubular function: Secondary | ICD-10-CM | POA: Diagnosis not present

## 2019-03-12 DIAGNOSIS — Z4931 Encounter for adequacy testing for hemodialysis: Secondary | ICD-10-CM | POA: Diagnosis not present

## 2019-03-15 DIAGNOSIS — R509 Fever, unspecified: Secondary | ICD-10-CM | POA: Diagnosis not present

## 2019-03-15 DIAGNOSIS — K7689 Other specified diseases of liver: Secondary | ICD-10-CM | POA: Diagnosis not present

## 2019-03-15 DIAGNOSIS — N186 End stage renal disease: Secondary | ICD-10-CM | POA: Diagnosis not present

## 2019-03-15 DIAGNOSIS — D631 Anemia in chronic kidney disease: Secondary | ICD-10-CM | POA: Diagnosis not present

## 2019-03-15 DIAGNOSIS — E8779 Other fluid overload: Secondary | ICD-10-CM | POA: Diagnosis not present

## 2019-03-15 DIAGNOSIS — Z992 Dependence on renal dialysis: Secondary | ICD-10-CM | POA: Diagnosis not present

## 2019-03-15 DIAGNOSIS — N2581 Secondary hyperparathyroidism of renal origin: Secondary | ICD-10-CM | POA: Diagnosis not present

## 2019-03-15 DIAGNOSIS — E7849 Other hyperlipidemia: Secondary | ICD-10-CM | POA: Diagnosis not present

## 2019-03-15 DIAGNOSIS — Z4931 Encounter for adequacy testing for hemodialysis: Secondary | ICD-10-CM | POA: Diagnosis not present

## 2019-03-15 DIAGNOSIS — E1022 Type 1 diabetes mellitus with diabetic chronic kidney disease: Secondary | ICD-10-CM | POA: Diagnosis not present

## 2019-03-15 DIAGNOSIS — I120 Hypertensive chronic kidney disease with stage 5 chronic kidney disease or end stage renal disease: Secondary | ICD-10-CM | POA: Diagnosis not present

## 2019-03-15 DIAGNOSIS — N2589 Other disorders resulting from impaired renal tubular function: Secondary | ICD-10-CM | POA: Diagnosis not present

## 2019-03-16 DIAGNOSIS — Z992 Dependence on renal dialysis: Secondary | ICD-10-CM | POA: Diagnosis not present

## 2019-03-16 DIAGNOSIS — N2589 Other disorders resulting from impaired renal tubular function: Secondary | ICD-10-CM | POA: Diagnosis not present

## 2019-03-16 DIAGNOSIS — N2581 Secondary hyperparathyroidism of renal origin: Secondary | ICD-10-CM | POA: Diagnosis not present

## 2019-03-16 DIAGNOSIS — R509 Fever, unspecified: Secondary | ICD-10-CM | POA: Diagnosis not present

## 2019-03-16 DIAGNOSIS — N186 End stage renal disease: Secondary | ICD-10-CM | POA: Diagnosis not present

## 2019-03-16 DIAGNOSIS — D631 Anemia in chronic kidney disease: Secondary | ICD-10-CM | POA: Diagnosis not present

## 2019-03-17 ENCOUNTER — Encounter: Payer: Self-pay | Admitting: Family Medicine

## 2019-03-18 DIAGNOSIS — D631 Anemia in chronic kidney disease: Secondary | ICD-10-CM | POA: Diagnosis not present

## 2019-03-18 DIAGNOSIS — N186 End stage renal disease: Secondary | ICD-10-CM | POA: Diagnosis not present

## 2019-03-18 DIAGNOSIS — N2581 Secondary hyperparathyroidism of renal origin: Secondary | ICD-10-CM | POA: Diagnosis not present

## 2019-03-18 DIAGNOSIS — N2589 Other disorders resulting from impaired renal tubular function: Secondary | ICD-10-CM | POA: Diagnosis not present

## 2019-03-18 DIAGNOSIS — Z992 Dependence on renal dialysis: Secondary | ICD-10-CM | POA: Diagnosis not present

## 2019-03-18 DIAGNOSIS — R509 Fever, unspecified: Secondary | ICD-10-CM | POA: Diagnosis not present

## 2019-03-19 DIAGNOSIS — Z992 Dependence on renal dialysis: Secondary | ICD-10-CM | POA: Diagnosis not present

## 2019-03-19 DIAGNOSIS — N2581 Secondary hyperparathyroidism of renal origin: Secondary | ICD-10-CM | POA: Diagnosis not present

## 2019-03-19 DIAGNOSIS — D631 Anemia in chronic kidney disease: Secondary | ICD-10-CM | POA: Diagnosis not present

## 2019-03-19 DIAGNOSIS — N2589 Other disorders resulting from impaired renal tubular function: Secondary | ICD-10-CM | POA: Diagnosis not present

## 2019-03-19 DIAGNOSIS — R509 Fever, unspecified: Secondary | ICD-10-CM | POA: Diagnosis not present

## 2019-03-19 DIAGNOSIS — N186 End stage renal disease: Secondary | ICD-10-CM | POA: Diagnosis not present

## 2019-03-22 DIAGNOSIS — N2581 Secondary hyperparathyroidism of renal origin: Secondary | ICD-10-CM | POA: Diagnosis not present

## 2019-03-22 DIAGNOSIS — Z992 Dependence on renal dialysis: Secondary | ICD-10-CM | POA: Diagnosis not present

## 2019-03-22 DIAGNOSIS — N186 End stage renal disease: Secondary | ICD-10-CM | POA: Diagnosis not present

## 2019-03-22 DIAGNOSIS — N2589 Other disorders resulting from impaired renal tubular function: Secondary | ICD-10-CM | POA: Diagnosis not present

## 2019-03-22 DIAGNOSIS — Z01818 Encounter for other preprocedural examination: Secondary | ICD-10-CM | POA: Diagnosis not present

## 2019-03-22 DIAGNOSIS — D631 Anemia in chronic kidney disease: Secondary | ICD-10-CM | POA: Diagnosis not present

## 2019-03-22 DIAGNOSIS — E1122 Type 2 diabetes mellitus with diabetic chronic kidney disease: Secondary | ICD-10-CM | POA: Diagnosis not present

## 2019-03-22 DIAGNOSIS — I120 Hypertensive chronic kidney disease with stage 5 chronic kidney disease or end stage renal disease: Secondary | ICD-10-CM | POA: Diagnosis not present

## 2019-03-22 DIAGNOSIS — R509 Fever, unspecified: Secondary | ICD-10-CM | POA: Diagnosis not present

## 2019-03-23 DIAGNOSIS — N186 End stage renal disease: Secondary | ICD-10-CM | POA: Diagnosis not present

## 2019-03-23 DIAGNOSIS — R509 Fever, unspecified: Secondary | ICD-10-CM | POA: Diagnosis not present

## 2019-03-23 DIAGNOSIS — Z992 Dependence on renal dialysis: Secondary | ICD-10-CM | POA: Diagnosis not present

## 2019-03-23 DIAGNOSIS — N2589 Other disorders resulting from impaired renal tubular function: Secondary | ICD-10-CM | POA: Diagnosis not present

## 2019-03-23 DIAGNOSIS — D631 Anemia in chronic kidney disease: Secondary | ICD-10-CM | POA: Diagnosis not present

## 2019-03-23 DIAGNOSIS — N2581 Secondary hyperparathyroidism of renal origin: Secondary | ICD-10-CM | POA: Diagnosis not present

## 2019-03-25 DIAGNOSIS — N2589 Other disorders resulting from impaired renal tubular function: Secondary | ICD-10-CM | POA: Diagnosis not present

## 2019-03-25 DIAGNOSIS — R509 Fever, unspecified: Secondary | ICD-10-CM | POA: Diagnosis not present

## 2019-03-25 DIAGNOSIS — D631 Anemia in chronic kidney disease: Secondary | ICD-10-CM | POA: Diagnosis not present

## 2019-03-25 DIAGNOSIS — Z992 Dependence on renal dialysis: Secondary | ICD-10-CM | POA: Diagnosis not present

## 2019-03-25 DIAGNOSIS — N186 End stage renal disease: Secondary | ICD-10-CM | POA: Diagnosis not present

## 2019-03-25 DIAGNOSIS — N2581 Secondary hyperparathyroidism of renal origin: Secondary | ICD-10-CM | POA: Diagnosis not present

## 2019-03-26 DIAGNOSIS — Z992 Dependence on renal dialysis: Secondary | ICD-10-CM | POA: Diagnosis not present

## 2019-03-26 DIAGNOSIS — R509 Fever, unspecified: Secondary | ICD-10-CM | POA: Diagnosis not present

## 2019-03-26 DIAGNOSIS — N2589 Other disorders resulting from impaired renal tubular function: Secondary | ICD-10-CM | POA: Diagnosis not present

## 2019-03-26 DIAGNOSIS — N2581 Secondary hyperparathyroidism of renal origin: Secondary | ICD-10-CM | POA: Diagnosis not present

## 2019-03-26 DIAGNOSIS — N186 End stage renal disease: Secondary | ICD-10-CM | POA: Diagnosis not present

## 2019-03-26 DIAGNOSIS — D631 Anemia in chronic kidney disease: Secondary | ICD-10-CM | POA: Diagnosis not present

## 2019-03-29 DIAGNOSIS — N186 End stage renal disease: Secondary | ICD-10-CM | POA: Diagnosis not present

## 2019-03-29 DIAGNOSIS — D631 Anemia in chronic kidney disease: Secondary | ICD-10-CM | POA: Diagnosis not present

## 2019-03-29 DIAGNOSIS — N2589 Other disorders resulting from impaired renal tubular function: Secondary | ICD-10-CM | POA: Diagnosis not present

## 2019-03-29 DIAGNOSIS — Z992 Dependence on renal dialysis: Secondary | ICD-10-CM | POA: Diagnosis not present

## 2019-03-29 DIAGNOSIS — R509 Fever, unspecified: Secondary | ICD-10-CM | POA: Diagnosis not present

## 2019-03-29 DIAGNOSIS — N2581 Secondary hyperparathyroidism of renal origin: Secondary | ICD-10-CM | POA: Diagnosis not present

## 2019-03-30 DIAGNOSIS — N186 End stage renal disease: Secondary | ICD-10-CM | POA: Diagnosis not present

## 2019-03-30 DIAGNOSIS — Z992 Dependence on renal dialysis: Secondary | ICD-10-CM | POA: Diagnosis not present

## 2019-03-30 DIAGNOSIS — N2589 Other disorders resulting from impaired renal tubular function: Secondary | ICD-10-CM | POA: Diagnosis not present

## 2019-03-30 DIAGNOSIS — D631 Anemia in chronic kidney disease: Secondary | ICD-10-CM | POA: Diagnosis not present

## 2019-03-30 DIAGNOSIS — N2581 Secondary hyperparathyroidism of renal origin: Secondary | ICD-10-CM | POA: Diagnosis not present

## 2019-03-30 DIAGNOSIS — R509 Fever, unspecified: Secondary | ICD-10-CM | POA: Diagnosis not present

## 2019-04-01 DIAGNOSIS — N186 End stage renal disease: Secondary | ICD-10-CM | POA: Diagnosis not present

## 2019-04-01 DIAGNOSIS — D631 Anemia in chronic kidney disease: Secondary | ICD-10-CM | POA: Diagnosis not present

## 2019-04-01 DIAGNOSIS — Z992 Dependence on renal dialysis: Secondary | ICD-10-CM | POA: Diagnosis not present

## 2019-04-01 DIAGNOSIS — N2581 Secondary hyperparathyroidism of renal origin: Secondary | ICD-10-CM | POA: Diagnosis not present

## 2019-04-01 DIAGNOSIS — R509 Fever, unspecified: Secondary | ICD-10-CM | POA: Diagnosis not present

## 2019-04-01 DIAGNOSIS — N2589 Other disorders resulting from impaired renal tubular function: Secondary | ICD-10-CM | POA: Diagnosis not present

## 2019-04-02 DIAGNOSIS — Z992 Dependence on renal dialysis: Secondary | ICD-10-CM | POA: Diagnosis not present

## 2019-04-02 DIAGNOSIS — R509 Fever, unspecified: Secondary | ICD-10-CM | POA: Diagnosis not present

## 2019-04-02 DIAGNOSIS — D631 Anemia in chronic kidney disease: Secondary | ICD-10-CM | POA: Diagnosis not present

## 2019-04-02 DIAGNOSIS — N2589 Other disorders resulting from impaired renal tubular function: Secondary | ICD-10-CM | POA: Diagnosis not present

## 2019-04-02 DIAGNOSIS — N186 End stage renal disease: Secondary | ICD-10-CM | POA: Diagnosis not present

## 2019-04-02 DIAGNOSIS — N2581 Secondary hyperparathyroidism of renal origin: Secondary | ICD-10-CM | POA: Diagnosis not present

## 2019-04-05 DIAGNOSIS — R509 Fever, unspecified: Secondary | ICD-10-CM | POA: Diagnosis not present

## 2019-04-05 DIAGNOSIS — Z992 Dependence on renal dialysis: Secondary | ICD-10-CM | POA: Diagnosis not present

## 2019-04-05 DIAGNOSIS — N2589 Other disorders resulting from impaired renal tubular function: Secondary | ICD-10-CM | POA: Diagnosis not present

## 2019-04-05 DIAGNOSIS — D631 Anemia in chronic kidney disease: Secondary | ICD-10-CM | POA: Diagnosis not present

## 2019-04-05 DIAGNOSIS — N186 End stage renal disease: Secondary | ICD-10-CM | POA: Diagnosis not present

## 2019-04-05 DIAGNOSIS — N2581 Secondary hyperparathyroidism of renal origin: Secondary | ICD-10-CM | POA: Diagnosis not present

## 2019-04-06 DIAGNOSIS — N2581 Secondary hyperparathyroidism of renal origin: Secondary | ICD-10-CM | POA: Diagnosis not present

## 2019-04-06 DIAGNOSIS — D631 Anemia in chronic kidney disease: Secondary | ICD-10-CM | POA: Diagnosis not present

## 2019-04-06 DIAGNOSIS — R509 Fever, unspecified: Secondary | ICD-10-CM | POA: Diagnosis not present

## 2019-04-06 DIAGNOSIS — N2589 Other disorders resulting from impaired renal tubular function: Secondary | ICD-10-CM | POA: Diagnosis not present

## 2019-04-06 DIAGNOSIS — N186 End stage renal disease: Secondary | ICD-10-CM | POA: Diagnosis not present

## 2019-04-06 DIAGNOSIS — Z992 Dependence on renal dialysis: Secondary | ICD-10-CM | POA: Diagnosis not present

## 2019-04-08 DIAGNOSIS — N2589 Other disorders resulting from impaired renal tubular function: Secondary | ICD-10-CM | POA: Diagnosis not present

## 2019-04-08 DIAGNOSIS — Z992 Dependence on renal dialysis: Secondary | ICD-10-CM | POA: Diagnosis not present

## 2019-04-08 DIAGNOSIS — N2581 Secondary hyperparathyroidism of renal origin: Secondary | ICD-10-CM | POA: Diagnosis not present

## 2019-04-08 DIAGNOSIS — D631 Anemia in chronic kidney disease: Secondary | ICD-10-CM | POA: Diagnosis not present

## 2019-04-08 DIAGNOSIS — N186 End stage renal disease: Secondary | ICD-10-CM | POA: Diagnosis not present

## 2019-04-08 DIAGNOSIS — R509 Fever, unspecified: Secondary | ICD-10-CM | POA: Diagnosis not present

## 2019-04-09 DIAGNOSIS — N2581 Secondary hyperparathyroidism of renal origin: Secondary | ICD-10-CM | POA: Diagnosis not present

## 2019-04-09 DIAGNOSIS — N186 End stage renal disease: Secondary | ICD-10-CM | POA: Diagnosis not present

## 2019-04-09 DIAGNOSIS — D631 Anemia in chronic kidney disease: Secondary | ICD-10-CM | POA: Diagnosis not present

## 2019-04-09 DIAGNOSIS — R509 Fever, unspecified: Secondary | ICD-10-CM | POA: Diagnosis not present

## 2019-04-09 DIAGNOSIS — N2589 Other disorders resulting from impaired renal tubular function: Secondary | ICD-10-CM | POA: Diagnosis not present

## 2019-04-09 DIAGNOSIS — Z992 Dependence on renal dialysis: Secondary | ICD-10-CM | POA: Diagnosis not present

## 2019-04-12 DIAGNOSIS — R509 Fever, unspecified: Secondary | ICD-10-CM | POA: Diagnosis not present

## 2019-04-12 DIAGNOSIS — D631 Anemia in chronic kidney disease: Secondary | ICD-10-CM | POA: Diagnosis not present

## 2019-04-12 DIAGNOSIS — Z4931 Encounter for adequacy testing for hemodialysis: Secondary | ICD-10-CM | POA: Diagnosis not present

## 2019-04-12 DIAGNOSIS — Z23 Encounter for immunization: Secondary | ICD-10-CM | POA: Diagnosis not present

## 2019-04-12 DIAGNOSIS — N2581 Secondary hyperparathyroidism of renal origin: Secondary | ICD-10-CM | POA: Diagnosis not present

## 2019-04-12 DIAGNOSIS — N186 End stage renal disease: Secondary | ICD-10-CM | POA: Diagnosis not present

## 2019-04-12 DIAGNOSIS — Z79899 Other long term (current) drug therapy: Secondary | ICD-10-CM | POA: Diagnosis not present

## 2019-04-12 DIAGNOSIS — E8779 Other fluid overload: Secondary | ICD-10-CM | POA: Diagnosis not present

## 2019-04-12 DIAGNOSIS — E1022 Type 1 diabetes mellitus with diabetic chronic kidney disease: Secondary | ICD-10-CM | POA: Diagnosis not present

## 2019-04-12 DIAGNOSIS — R17 Unspecified jaundice: Secondary | ICD-10-CM | POA: Diagnosis not present

## 2019-04-12 DIAGNOSIS — Z992 Dependence on renal dialysis: Secondary | ICD-10-CM | POA: Diagnosis not present

## 2019-04-12 DIAGNOSIS — E440 Moderate protein-calorie malnutrition: Secondary | ICD-10-CM | POA: Diagnosis not present

## 2019-04-12 DIAGNOSIS — I120 Hypertensive chronic kidney disease with stage 5 chronic kidney disease or end stage renal disease: Secondary | ICD-10-CM | POA: Diagnosis not present

## 2019-04-12 DIAGNOSIS — K7689 Other specified diseases of liver: Secondary | ICD-10-CM | POA: Diagnosis not present

## 2019-04-13 DIAGNOSIS — Z992 Dependence on renal dialysis: Secondary | ICD-10-CM | POA: Diagnosis not present

## 2019-04-13 DIAGNOSIS — Z20828 Contact with and (suspected) exposure to other viral communicable diseases: Secondary | ICD-10-CM | POA: Diagnosis not present

## 2019-04-13 DIAGNOSIS — E440 Moderate protein-calorie malnutrition: Secondary | ICD-10-CM | POA: Diagnosis not present

## 2019-04-13 DIAGNOSIS — Z79899 Other long term (current) drug therapy: Secondary | ICD-10-CM | POA: Diagnosis not present

## 2019-04-13 DIAGNOSIS — R509 Fever, unspecified: Secondary | ICD-10-CM | POA: Diagnosis not present

## 2019-04-13 DIAGNOSIS — N186 End stage renal disease: Secondary | ICD-10-CM | POA: Diagnosis not present

## 2019-04-13 DIAGNOSIS — Z4931 Encounter for adequacy testing for hemodialysis: Secondary | ICD-10-CM | POA: Diagnosis not present

## 2019-04-15 DIAGNOSIS — Z992 Dependence on renal dialysis: Secondary | ICD-10-CM | POA: Diagnosis not present

## 2019-04-15 DIAGNOSIS — R509 Fever, unspecified: Secondary | ICD-10-CM | POA: Diagnosis not present

## 2019-04-15 DIAGNOSIS — Z4931 Encounter for adequacy testing for hemodialysis: Secondary | ICD-10-CM | POA: Diagnosis not present

## 2019-04-15 DIAGNOSIS — N186 End stage renal disease: Secondary | ICD-10-CM | POA: Diagnosis not present

## 2019-04-15 DIAGNOSIS — Z79899 Other long term (current) drug therapy: Secondary | ICD-10-CM | POA: Diagnosis not present

## 2019-04-15 DIAGNOSIS — E440 Moderate protein-calorie malnutrition: Secondary | ICD-10-CM | POA: Diagnosis not present

## 2019-04-16 DIAGNOSIS — R509 Fever, unspecified: Secondary | ICD-10-CM | POA: Diagnosis not present

## 2019-04-16 DIAGNOSIS — Z4931 Encounter for adequacy testing for hemodialysis: Secondary | ICD-10-CM | POA: Diagnosis not present

## 2019-04-16 DIAGNOSIS — N186 End stage renal disease: Secondary | ICD-10-CM | POA: Diagnosis not present

## 2019-04-16 DIAGNOSIS — Z79899 Other long term (current) drug therapy: Secondary | ICD-10-CM | POA: Diagnosis not present

## 2019-04-16 DIAGNOSIS — E440 Moderate protein-calorie malnutrition: Secondary | ICD-10-CM | POA: Diagnosis not present

## 2019-04-16 DIAGNOSIS — Z992 Dependence on renal dialysis: Secondary | ICD-10-CM | POA: Diagnosis not present

## 2019-04-19 DIAGNOSIS — Z79899 Other long term (current) drug therapy: Secondary | ICD-10-CM | POA: Diagnosis not present

## 2019-04-19 DIAGNOSIS — R509 Fever, unspecified: Secondary | ICD-10-CM | POA: Diagnosis not present

## 2019-04-19 DIAGNOSIS — Z992 Dependence on renal dialysis: Secondary | ICD-10-CM | POA: Diagnosis not present

## 2019-04-19 DIAGNOSIS — Z4931 Encounter for adequacy testing for hemodialysis: Secondary | ICD-10-CM | POA: Diagnosis not present

## 2019-04-19 DIAGNOSIS — N186 End stage renal disease: Secondary | ICD-10-CM | POA: Diagnosis not present

## 2019-04-19 DIAGNOSIS — E440 Moderate protein-calorie malnutrition: Secondary | ICD-10-CM | POA: Diagnosis not present

## 2019-04-20 DIAGNOSIS — N186 End stage renal disease: Secondary | ICD-10-CM | POA: Diagnosis not present

## 2019-04-20 DIAGNOSIS — E440 Moderate protein-calorie malnutrition: Secondary | ICD-10-CM | POA: Diagnosis not present

## 2019-04-20 DIAGNOSIS — Z79899 Other long term (current) drug therapy: Secondary | ICD-10-CM | POA: Diagnosis not present

## 2019-04-20 DIAGNOSIS — Z992 Dependence on renal dialysis: Secondary | ICD-10-CM | POA: Diagnosis not present

## 2019-04-20 DIAGNOSIS — Z4931 Encounter for adequacy testing for hemodialysis: Secondary | ICD-10-CM | POA: Diagnosis not present

## 2019-04-20 DIAGNOSIS — R509 Fever, unspecified: Secondary | ICD-10-CM | POA: Diagnosis not present

## 2019-04-22 DIAGNOSIS — Z4931 Encounter for adequacy testing for hemodialysis: Secondary | ICD-10-CM | POA: Diagnosis not present

## 2019-04-22 DIAGNOSIS — Z992 Dependence on renal dialysis: Secondary | ICD-10-CM | POA: Diagnosis not present

## 2019-04-22 DIAGNOSIS — Z79899 Other long term (current) drug therapy: Secondary | ICD-10-CM | POA: Diagnosis not present

## 2019-04-22 DIAGNOSIS — R509 Fever, unspecified: Secondary | ICD-10-CM | POA: Diagnosis not present

## 2019-04-22 DIAGNOSIS — N186 End stage renal disease: Secondary | ICD-10-CM | POA: Diagnosis not present

## 2019-04-22 DIAGNOSIS — E440 Moderate protein-calorie malnutrition: Secondary | ICD-10-CM | POA: Diagnosis not present

## 2019-04-23 DIAGNOSIS — R509 Fever, unspecified: Secondary | ICD-10-CM | POA: Diagnosis not present

## 2019-04-23 DIAGNOSIS — Z79899 Other long term (current) drug therapy: Secondary | ICD-10-CM | POA: Diagnosis not present

## 2019-04-23 DIAGNOSIS — E440 Moderate protein-calorie malnutrition: Secondary | ICD-10-CM | POA: Diagnosis not present

## 2019-04-23 DIAGNOSIS — N186 End stage renal disease: Secondary | ICD-10-CM | POA: Diagnosis not present

## 2019-04-23 DIAGNOSIS — Z4931 Encounter for adequacy testing for hemodialysis: Secondary | ICD-10-CM | POA: Diagnosis not present

## 2019-04-23 DIAGNOSIS — Z992 Dependence on renal dialysis: Secondary | ICD-10-CM | POA: Diagnosis not present

## 2019-04-26 DIAGNOSIS — Z992 Dependence on renal dialysis: Secondary | ICD-10-CM | POA: Diagnosis not present

## 2019-04-26 DIAGNOSIS — Z4931 Encounter for adequacy testing for hemodialysis: Secondary | ICD-10-CM | POA: Diagnosis not present

## 2019-04-26 DIAGNOSIS — R509 Fever, unspecified: Secondary | ICD-10-CM | POA: Diagnosis not present

## 2019-04-26 DIAGNOSIS — Z79899 Other long term (current) drug therapy: Secondary | ICD-10-CM | POA: Diagnosis not present

## 2019-04-26 DIAGNOSIS — N186 End stage renal disease: Secondary | ICD-10-CM | POA: Diagnosis not present

## 2019-04-26 DIAGNOSIS — E440 Moderate protein-calorie malnutrition: Secondary | ICD-10-CM | POA: Diagnosis not present

## 2019-04-27 DIAGNOSIS — Z79899 Other long term (current) drug therapy: Secondary | ICD-10-CM | POA: Diagnosis not present

## 2019-04-27 DIAGNOSIS — R509 Fever, unspecified: Secondary | ICD-10-CM | POA: Diagnosis not present

## 2019-04-27 DIAGNOSIS — N186 End stage renal disease: Secondary | ICD-10-CM | POA: Diagnosis not present

## 2019-04-27 DIAGNOSIS — Z4931 Encounter for adequacy testing for hemodialysis: Secondary | ICD-10-CM | POA: Diagnosis not present

## 2019-04-27 DIAGNOSIS — Z992 Dependence on renal dialysis: Secondary | ICD-10-CM | POA: Diagnosis not present

## 2019-04-27 DIAGNOSIS — E440 Moderate protein-calorie malnutrition: Secondary | ICD-10-CM | POA: Diagnosis not present

## 2019-04-29 DIAGNOSIS — N186 End stage renal disease: Secondary | ICD-10-CM | POA: Diagnosis not present

## 2019-04-29 DIAGNOSIS — Z4931 Encounter for adequacy testing for hemodialysis: Secondary | ICD-10-CM | POA: Diagnosis not present

## 2019-04-29 DIAGNOSIS — R509 Fever, unspecified: Secondary | ICD-10-CM | POA: Diagnosis not present

## 2019-04-29 DIAGNOSIS — Z992 Dependence on renal dialysis: Secondary | ICD-10-CM | POA: Diagnosis not present

## 2019-04-29 DIAGNOSIS — Z79899 Other long term (current) drug therapy: Secondary | ICD-10-CM | POA: Diagnosis not present

## 2019-04-29 DIAGNOSIS — E440 Moderate protein-calorie malnutrition: Secondary | ICD-10-CM | POA: Diagnosis not present

## 2019-04-30 DIAGNOSIS — Z79899 Other long term (current) drug therapy: Secondary | ICD-10-CM | POA: Diagnosis not present

## 2019-04-30 DIAGNOSIS — E440 Moderate protein-calorie malnutrition: Secondary | ICD-10-CM | POA: Diagnosis not present

## 2019-04-30 DIAGNOSIS — Z992 Dependence on renal dialysis: Secondary | ICD-10-CM | POA: Diagnosis not present

## 2019-04-30 DIAGNOSIS — Z4931 Encounter for adequacy testing for hemodialysis: Secondary | ICD-10-CM | POA: Diagnosis not present

## 2019-04-30 DIAGNOSIS — N186 End stage renal disease: Secondary | ICD-10-CM | POA: Diagnosis not present

## 2019-04-30 DIAGNOSIS — R509 Fever, unspecified: Secondary | ICD-10-CM | POA: Diagnosis not present

## 2019-05-03 DIAGNOSIS — R509 Fever, unspecified: Secondary | ICD-10-CM | POA: Diagnosis not present

## 2019-05-03 DIAGNOSIS — Z992 Dependence on renal dialysis: Secondary | ICD-10-CM | POA: Diagnosis not present

## 2019-05-03 DIAGNOSIS — Z4931 Encounter for adequacy testing for hemodialysis: Secondary | ICD-10-CM | POA: Diagnosis not present

## 2019-05-03 DIAGNOSIS — Z79899 Other long term (current) drug therapy: Secondary | ICD-10-CM | POA: Diagnosis not present

## 2019-05-03 DIAGNOSIS — N186 End stage renal disease: Secondary | ICD-10-CM | POA: Diagnosis not present

## 2019-05-03 DIAGNOSIS — E440 Moderate protein-calorie malnutrition: Secondary | ICD-10-CM | POA: Diagnosis not present

## 2019-05-04 DIAGNOSIS — N186 End stage renal disease: Secondary | ICD-10-CM | POA: Diagnosis not present

## 2019-05-04 DIAGNOSIS — Z79899 Other long term (current) drug therapy: Secondary | ICD-10-CM | POA: Diagnosis not present

## 2019-05-04 DIAGNOSIS — R509 Fever, unspecified: Secondary | ICD-10-CM | POA: Diagnosis not present

## 2019-05-04 DIAGNOSIS — E440 Moderate protein-calorie malnutrition: Secondary | ICD-10-CM | POA: Diagnosis not present

## 2019-05-04 DIAGNOSIS — Z992 Dependence on renal dialysis: Secondary | ICD-10-CM | POA: Diagnosis not present

## 2019-05-04 DIAGNOSIS — Z4931 Encounter for adequacy testing for hemodialysis: Secondary | ICD-10-CM | POA: Diagnosis not present

## 2019-05-05 DIAGNOSIS — T82855A Stenosis of coronary artery stent, initial encounter: Secondary | ICD-10-CM | POA: Diagnosis not present

## 2019-05-05 DIAGNOSIS — R9431 Abnormal electrocardiogram [ECG] [EKG]: Secondary | ICD-10-CM | POA: Diagnosis not present

## 2019-05-05 DIAGNOSIS — R9439 Abnormal result of other cardiovascular function study: Secondary | ICD-10-CM | POA: Diagnosis not present

## 2019-05-05 DIAGNOSIS — I493 Ventricular premature depolarization: Secondary | ICD-10-CM | POA: Diagnosis not present

## 2019-05-05 DIAGNOSIS — I2582 Chronic total occlusion of coronary artery: Secondary | ICD-10-CM | POA: Diagnosis not present

## 2019-05-05 DIAGNOSIS — I2510 Atherosclerotic heart disease of native coronary artery without angina pectoris: Secondary | ICD-10-CM | POA: Diagnosis not present

## 2019-05-06 DIAGNOSIS — Z955 Presence of coronary angioplasty implant and graft: Secondary | ICD-10-CM | POA: Diagnosis not present

## 2019-05-06 DIAGNOSIS — I120 Hypertensive chronic kidney disease with stage 5 chronic kidney disease or end stage renal disease: Secondary | ICD-10-CM | POA: Diagnosis not present

## 2019-05-06 DIAGNOSIS — Z992 Dependence on renal dialysis: Secondary | ICD-10-CM | POA: Diagnosis not present

## 2019-05-06 DIAGNOSIS — R9431 Abnormal electrocardiogram [ECG] [EKG]: Secondary | ICD-10-CM | POA: Diagnosis not present

## 2019-05-06 DIAGNOSIS — E440 Moderate protein-calorie malnutrition: Secondary | ICD-10-CM | POA: Diagnosis not present

## 2019-05-06 DIAGNOSIS — I2510 Atherosclerotic heart disease of native coronary artery without angina pectoris: Secondary | ICD-10-CM | POA: Diagnosis not present

## 2019-05-06 DIAGNOSIS — Z79899 Other long term (current) drug therapy: Secondary | ICD-10-CM | POA: Diagnosis not present

## 2019-05-06 DIAGNOSIS — Z7982 Long term (current) use of aspirin: Secondary | ICD-10-CM | POA: Diagnosis not present

## 2019-05-06 DIAGNOSIS — Z7902 Long term (current) use of antithrombotics/antiplatelets: Secondary | ICD-10-CM | POA: Diagnosis not present

## 2019-05-06 DIAGNOSIS — R9439 Abnormal result of other cardiovascular function study: Secondary | ICD-10-CM | POA: Diagnosis not present

## 2019-05-06 DIAGNOSIS — R509 Fever, unspecified: Secondary | ICD-10-CM | POA: Diagnosis not present

## 2019-05-06 DIAGNOSIS — N186 End stage renal disease: Secondary | ICD-10-CM | POA: Diagnosis not present

## 2019-05-06 DIAGNOSIS — I491 Atrial premature depolarization: Secondary | ICD-10-CM | POA: Diagnosis not present

## 2019-05-06 DIAGNOSIS — Z4931 Encounter for adequacy testing for hemodialysis: Secondary | ICD-10-CM | POA: Diagnosis not present

## 2019-05-06 DIAGNOSIS — I493 Ventricular premature depolarization: Secondary | ICD-10-CM | POA: Diagnosis not present

## 2019-05-07 DIAGNOSIS — N186 End stage renal disease: Secondary | ICD-10-CM | POA: Diagnosis not present

## 2019-05-07 DIAGNOSIS — Z79899 Other long term (current) drug therapy: Secondary | ICD-10-CM | POA: Diagnosis not present

## 2019-05-07 DIAGNOSIS — Z992 Dependence on renal dialysis: Secondary | ICD-10-CM | POA: Diagnosis not present

## 2019-05-07 DIAGNOSIS — R509 Fever, unspecified: Secondary | ICD-10-CM | POA: Diagnosis not present

## 2019-05-07 DIAGNOSIS — E440 Moderate protein-calorie malnutrition: Secondary | ICD-10-CM | POA: Diagnosis not present

## 2019-05-07 DIAGNOSIS — Z4931 Encounter for adequacy testing for hemodialysis: Secondary | ICD-10-CM | POA: Diagnosis not present

## 2019-05-10 DIAGNOSIS — E440 Moderate protein-calorie malnutrition: Secondary | ICD-10-CM | POA: Diagnosis not present

## 2019-05-10 DIAGNOSIS — N186 End stage renal disease: Secondary | ICD-10-CM | POA: Diagnosis not present

## 2019-05-10 DIAGNOSIS — Z79899 Other long term (current) drug therapy: Secondary | ICD-10-CM | POA: Diagnosis not present

## 2019-05-10 DIAGNOSIS — R509 Fever, unspecified: Secondary | ICD-10-CM | POA: Diagnosis not present

## 2019-05-10 DIAGNOSIS — Z4931 Encounter for adequacy testing for hemodialysis: Secondary | ICD-10-CM | POA: Diagnosis not present

## 2019-05-10 DIAGNOSIS — Z992 Dependence on renal dialysis: Secondary | ICD-10-CM | POA: Diagnosis not present

## 2019-05-11 DIAGNOSIS — Z79899 Other long term (current) drug therapy: Secondary | ICD-10-CM | POA: Diagnosis not present

## 2019-05-11 DIAGNOSIS — R509 Fever, unspecified: Secondary | ICD-10-CM | POA: Diagnosis not present

## 2019-05-11 DIAGNOSIS — E440 Moderate protein-calorie malnutrition: Secondary | ICD-10-CM | POA: Diagnosis not present

## 2019-05-11 DIAGNOSIS — N186 End stage renal disease: Secondary | ICD-10-CM | POA: Diagnosis not present

## 2019-05-11 DIAGNOSIS — Z992 Dependence on renal dialysis: Secondary | ICD-10-CM | POA: Diagnosis not present

## 2019-05-11 DIAGNOSIS — Z4931 Encounter for adequacy testing for hemodialysis: Secondary | ICD-10-CM | POA: Diagnosis not present

## 2019-05-13 DIAGNOSIS — K7689 Other specified diseases of liver: Secondary | ICD-10-CM | POA: Diagnosis not present

## 2019-05-13 DIAGNOSIS — N2589 Other disorders resulting from impaired renal tubular function: Secondary | ICD-10-CM | POA: Diagnosis not present

## 2019-05-13 DIAGNOSIS — D509 Iron deficiency anemia, unspecified: Secondary | ICD-10-CM | POA: Diagnosis not present

## 2019-05-13 DIAGNOSIS — E1022 Type 1 diabetes mellitus with diabetic chronic kidney disease: Secondary | ICD-10-CM | POA: Diagnosis not present

## 2019-05-13 DIAGNOSIS — Z4931 Encounter for adequacy testing for hemodialysis: Secondary | ICD-10-CM | POA: Diagnosis not present

## 2019-05-13 DIAGNOSIS — Z992 Dependence on renal dialysis: Secondary | ICD-10-CM | POA: Diagnosis not present

## 2019-05-13 DIAGNOSIS — D631 Anemia in chronic kidney disease: Secondary | ICD-10-CM | POA: Diagnosis not present

## 2019-05-13 DIAGNOSIS — N2581 Secondary hyperparathyroidism of renal origin: Secondary | ICD-10-CM | POA: Diagnosis not present

## 2019-05-13 DIAGNOSIS — I120 Hypertensive chronic kidney disease with stage 5 chronic kidney disease or end stage renal disease: Secondary | ICD-10-CM | POA: Diagnosis not present

## 2019-05-13 DIAGNOSIS — N186 End stage renal disease: Secondary | ICD-10-CM | POA: Diagnosis not present

## 2019-05-13 DIAGNOSIS — E8779 Other fluid overload: Secondary | ICD-10-CM | POA: Diagnosis not present

## 2019-05-13 DIAGNOSIS — Z23 Encounter for immunization: Secondary | ICD-10-CM | POA: Diagnosis not present

## 2019-05-13 DIAGNOSIS — R509 Fever, unspecified: Secondary | ICD-10-CM | POA: Diagnosis not present

## 2019-05-14 DIAGNOSIS — Z4931 Encounter for adequacy testing for hemodialysis: Secondary | ICD-10-CM | POA: Diagnosis not present

## 2019-05-14 DIAGNOSIS — Z992 Dependence on renal dialysis: Secondary | ICD-10-CM | POA: Diagnosis not present

## 2019-05-14 DIAGNOSIS — D509 Iron deficiency anemia, unspecified: Secondary | ICD-10-CM | POA: Diagnosis not present

## 2019-05-14 DIAGNOSIS — R509 Fever, unspecified: Secondary | ICD-10-CM | POA: Diagnosis not present

## 2019-05-14 DIAGNOSIS — N186 End stage renal disease: Secondary | ICD-10-CM | POA: Diagnosis not present

## 2019-05-14 DIAGNOSIS — D631 Anemia in chronic kidney disease: Secondary | ICD-10-CM | POA: Diagnosis not present

## 2019-05-17 DIAGNOSIS — Z4931 Encounter for adequacy testing for hemodialysis: Secondary | ICD-10-CM | POA: Diagnosis not present

## 2019-05-17 DIAGNOSIS — Z992 Dependence on renal dialysis: Secondary | ICD-10-CM | POA: Diagnosis not present

## 2019-05-17 DIAGNOSIS — D631 Anemia in chronic kidney disease: Secondary | ICD-10-CM | POA: Diagnosis not present

## 2019-05-17 DIAGNOSIS — N186 End stage renal disease: Secondary | ICD-10-CM | POA: Diagnosis not present

## 2019-05-17 DIAGNOSIS — R509 Fever, unspecified: Secondary | ICD-10-CM | POA: Diagnosis not present

## 2019-05-17 DIAGNOSIS — D509 Iron deficiency anemia, unspecified: Secondary | ICD-10-CM | POA: Diagnosis not present

## 2019-05-18 DIAGNOSIS — D509 Iron deficiency anemia, unspecified: Secondary | ICD-10-CM | POA: Diagnosis not present

## 2019-05-18 DIAGNOSIS — R509 Fever, unspecified: Secondary | ICD-10-CM | POA: Diagnosis not present

## 2019-05-18 DIAGNOSIS — Z4931 Encounter for adequacy testing for hemodialysis: Secondary | ICD-10-CM | POA: Diagnosis not present

## 2019-05-18 DIAGNOSIS — D631 Anemia in chronic kidney disease: Secondary | ICD-10-CM | POA: Diagnosis not present

## 2019-05-18 DIAGNOSIS — N186 End stage renal disease: Secondary | ICD-10-CM | POA: Diagnosis not present

## 2019-05-18 DIAGNOSIS — Z992 Dependence on renal dialysis: Secondary | ICD-10-CM | POA: Diagnosis not present

## 2019-05-20 DIAGNOSIS — E7849 Other hyperlipidemia: Secondary | ICD-10-CM | POA: Diagnosis not present

## 2019-05-20 DIAGNOSIS — D509 Iron deficiency anemia, unspecified: Secondary | ICD-10-CM | POA: Diagnosis not present

## 2019-05-20 DIAGNOSIS — R509 Fever, unspecified: Secondary | ICD-10-CM | POA: Diagnosis not present

## 2019-05-20 DIAGNOSIS — Z4931 Encounter for adequacy testing for hemodialysis: Secondary | ICD-10-CM | POA: Diagnosis not present

## 2019-05-20 DIAGNOSIS — N186 End stage renal disease: Secondary | ICD-10-CM | POA: Diagnosis not present

## 2019-05-20 DIAGNOSIS — D631 Anemia in chronic kidney disease: Secondary | ICD-10-CM | POA: Diagnosis not present

## 2019-05-20 DIAGNOSIS — E1022 Type 1 diabetes mellitus with diabetic chronic kidney disease: Secondary | ICD-10-CM | POA: Diagnosis not present

## 2019-05-20 DIAGNOSIS — Z992 Dependence on renal dialysis: Secondary | ICD-10-CM | POA: Diagnosis not present

## 2019-05-21 DIAGNOSIS — D509 Iron deficiency anemia, unspecified: Secondary | ICD-10-CM | POA: Diagnosis not present

## 2019-05-21 DIAGNOSIS — Z4931 Encounter for adequacy testing for hemodialysis: Secondary | ICD-10-CM | POA: Diagnosis not present

## 2019-05-21 DIAGNOSIS — N186 End stage renal disease: Secondary | ICD-10-CM | POA: Diagnosis not present

## 2019-05-21 DIAGNOSIS — Z992 Dependence on renal dialysis: Secondary | ICD-10-CM | POA: Diagnosis not present

## 2019-05-21 DIAGNOSIS — D631 Anemia in chronic kidney disease: Secondary | ICD-10-CM | POA: Diagnosis not present

## 2019-05-21 DIAGNOSIS — R509 Fever, unspecified: Secondary | ICD-10-CM | POA: Diagnosis not present

## 2019-05-24 DIAGNOSIS — Z992 Dependence on renal dialysis: Secondary | ICD-10-CM | POA: Diagnosis not present

## 2019-05-24 DIAGNOSIS — Z4931 Encounter for adequacy testing for hemodialysis: Secondary | ICD-10-CM | POA: Diagnosis not present

## 2019-05-24 DIAGNOSIS — D631 Anemia in chronic kidney disease: Secondary | ICD-10-CM | POA: Diagnosis not present

## 2019-05-24 DIAGNOSIS — R509 Fever, unspecified: Secondary | ICD-10-CM | POA: Diagnosis not present

## 2019-05-24 DIAGNOSIS — D509 Iron deficiency anemia, unspecified: Secondary | ICD-10-CM | POA: Diagnosis not present

## 2019-05-24 DIAGNOSIS — N186 End stage renal disease: Secondary | ICD-10-CM | POA: Diagnosis not present

## 2019-05-25 DIAGNOSIS — N186 End stage renal disease: Secondary | ICD-10-CM | POA: Diagnosis not present

## 2019-05-25 DIAGNOSIS — Z4931 Encounter for adequacy testing for hemodialysis: Secondary | ICD-10-CM | POA: Diagnosis not present

## 2019-05-25 DIAGNOSIS — D509 Iron deficiency anemia, unspecified: Secondary | ICD-10-CM | POA: Diagnosis not present

## 2019-05-25 DIAGNOSIS — Z992 Dependence on renal dialysis: Secondary | ICD-10-CM | POA: Diagnosis not present

## 2019-05-25 DIAGNOSIS — R509 Fever, unspecified: Secondary | ICD-10-CM | POA: Diagnosis not present

## 2019-05-25 DIAGNOSIS — D631 Anemia in chronic kidney disease: Secondary | ICD-10-CM | POA: Diagnosis not present

## 2019-05-27 DIAGNOSIS — Z4931 Encounter for adequacy testing for hemodialysis: Secondary | ICD-10-CM | POA: Diagnosis not present

## 2019-05-27 DIAGNOSIS — Z992 Dependence on renal dialysis: Secondary | ICD-10-CM | POA: Diagnosis not present

## 2019-05-27 DIAGNOSIS — N186 End stage renal disease: Secondary | ICD-10-CM | POA: Diagnosis not present

## 2019-05-27 DIAGNOSIS — D631 Anemia in chronic kidney disease: Secondary | ICD-10-CM | POA: Diagnosis not present

## 2019-05-27 DIAGNOSIS — D509 Iron deficiency anemia, unspecified: Secondary | ICD-10-CM | POA: Diagnosis not present

## 2019-05-27 DIAGNOSIS — R509 Fever, unspecified: Secondary | ICD-10-CM | POA: Diagnosis not present

## 2019-05-28 DIAGNOSIS — Z992 Dependence on renal dialysis: Secondary | ICD-10-CM | POA: Diagnosis not present

## 2019-05-28 DIAGNOSIS — D509 Iron deficiency anemia, unspecified: Secondary | ICD-10-CM | POA: Diagnosis not present

## 2019-05-28 DIAGNOSIS — N186 End stage renal disease: Secondary | ICD-10-CM | POA: Diagnosis not present

## 2019-05-28 DIAGNOSIS — D631 Anemia in chronic kidney disease: Secondary | ICD-10-CM | POA: Diagnosis not present

## 2019-05-28 DIAGNOSIS — R509 Fever, unspecified: Secondary | ICD-10-CM | POA: Diagnosis not present

## 2019-05-28 DIAGNOSIS — Z4931 Encounter for adequacy testing for hemodialysis: Secondary | ICD-10-CM | POA: Diagnosis not present

## 2019-05-31 DIAGNOSIS — Z992 Dependence on renal dialysis: Secondary | ICD-10-CM | POA: Diagnosis not present

## 2019-05-31 DIAGNOSIS — D631 Anemia in chronic kidney disease: Secondary | ICD-10-CM | POA: Diagnosis not present

## 2019-05-31 DIAGNOSIS — D509 Iron deficiency anemia, unspecified: Secondary | ICD-10-CM | POA: Diagnosis not present

## 2019-05-31 DIAGNOSIS — Z4931 Encounter for adequacy testing for hemodialysis: Secondary | ICD-10-CM | POA: Diagnosis not present

## 2019-05-31 DIAGNOSIS — N186 End stage renal disease: Secondary | ICD-10-CM | POA: Diagnosis not present

## 2019-05-31 DIAGNOSIS — R509 Fever, unspecified: Secondary | ICD-10-CM | POA: Diagnosis not present

## 2019-06-01 DIAGNOSIS — R509 Fever, unspecified: Secondary | ICD-10-CM | POA: Diagnosis not present

## 2019-06-01 DIAGNOSIS — N186 End stage renal disease: Secondary | ICD-10-CM | POA: Diagnosis not present

## 2019-06-01 DIAGNOSIS — D631 Anemia in chronic kidney disease: Secondary | ICD-10-CM | POA: Diagnosis not present

## 2019-06-01 DIAGNOSIS — D509 Iron deficiency anemia, unspecified: Secondary | ICD-10-CM | POA: Diagnosis not present

## 2019-06-01 DIAGNOSIS — Z4931 Encounter for adequacy testing for hemodialysis: Secondary | ICD-10-CM | POA: Diagnosis not present

## 2019-06-01 DIAGNOSIS — Z992 Dependence on renal dialysis: Secondary | ICD-10-CM | POA: Diagnosis not present

## 2019-06-03 DIAGNOSIS — Z4931 Encounter for adequacy testing for hemodialysis: Secondary | ICD-10-CM | POA: Diagnosis not present

## 2019-06-03 DIAGNOSIS — R509 Fever, unspecified: Secondary | ICD-10-CM | POA: Diagnosis not present

## 2019-06-03 DIAGNOSIS — D631 Anemia in chronic kidney disease: Secondary | ICD-10-CM | POA: Diagnosis not present

## 2019-06-03 DIAGNOSIS — N186 End stage renal disease: Secondary | ICD-10-CM | POA: Diagnosis not present

## 2019-06-03 DIAGNOSIS — Z992 Dependence on renal dialysis: Secondary | ICD-10-CM | POA: Diagnosis not present

## 2019-06-03 DIAGNOSIS — D509 Iron deficiency anemia, unspecified: Secondary | ICD-10-CM | POA: Diagnosis not present

## 2019-06-04 DIAGNOSIS — D631 Anemia in chronic kidney disease: Secondary | ICD-10-CM | POA: Diagnosis not present

## 2019-06-04 DIAGNOSIS — R509 Fever, unspecified: Secondary | ICD-10-CM | POA: Diagnosis not present

## 2019-06-04 DIAGNOSIS — D509 Iron deficiency anemia, unspecified: Secondary | ICD-10-CM | POA: Diagnosis not present

## 2019-06-04 DIAGNOSIS — Z992 Dependence on renal dialysis: Secondary | ICD-10-CM | POA: Diagnosis not present

## 2019-06-04 DIAGNOSIS — N186 End stage renal disease: Secondary | ICD-10-CM | POA: Diagnosis not present

## 2019-06-04 DIAGNOSIS — Z4931 Encounter for adequacy testing for hemodialysis: Secondary | ICD-10-CM | POA: Diagnosis not present

## 2019-06-07 ENCOUNTER — Ambulatory Visit: Payer: BC Managed Care – PPO | Admitting: "Endocrinology

## 2019-06-07 DIAGNOSIS — D631 Anemia in chronic kidney disease: Secondary | ICD-10-CM | POA: Diagnosis not present

## 2019-06-07 DIAGNOSIS — D509 Iron deficiency anemia, unspecified: Secondary | ICD-10-CM | POA: Diagnosis not present

## 2019-06-07 DIAGNOSIS — R509 Fever, unspecified: Secondary | ICD-10-CM | POA: Diagnosis not present

## 2019-06-07 DIAGNOSIS — N186 End stage renal disease: Secondary | ICD-10-CM | POA: Diagnosis not present

## 2019-06-07 DIAGNOSIS — Z992 Dependence on renal dialysis: Secondary | ICD-10-CM | POA: Diagnosis not present

## 2019-06-07 DIAGNOSIS — Z4931 Encounter for adequacy testing for hemodialysis: Secondary | ICD-10-CM | POA: Diagnosis not present

## 2019-06-08 DIAGNOSIS — N186 End stage renal disease: Secondary | ICD-10-CM | POA: Diagnosis not present

## 2019-06-08 DIAGNOSIS — D631 Anemia in chronic kidney disease: Secondary | ICD-10-CM | POA: Diagnosis not present

## 2019-06-08 DIAGNOSIS — R509 Fever, unspecified: Secondary | ICD-10-CM | POA: Diagnosis not present

## 2019-06-08 DIAGNOSIS — Z992 Dependence on renal dialysis: Secondary | ICD-10-CM | POA: Diagnosis not present

## 2019-06-08 DIAGNOSIS — D509 Iron deficiency anemia, unspecified: Secondary | ICD-10-CM | POA: Diagnosis not present

## 2019-06-08 DIAGNOSIS — Z4931 Encounter for adequacy testing for hemodialysis: Secondary | ICD-10-CM | POA: Diagnosis not present

## 2019-06-09 DIAGNOSIS — D631 Anemia in chronic kidney disease: Secondary | ICD-10-CM | POA: Diagnosis not present

## 2019-06-09 DIAGNOSIS — N186 End stage renal disease: Secondary | ICD-10-CM | POA: Diagnosis not present

## 2019-06-09 DIAGNOSIS — D509 Iron deficiency anemia, unspecified: Secondary | ICD-10-CM | POA: Diagnosis not present

## 2019-06-09 DIAGNOSIS — R509 Fever, unspecified: Secondary | ICD-10-CM | POA: Diagnosis not present

## 2019-06-09 DIAGNOSIS — Z992 Dependence on renal dialysis: Secondary | ICD-10-CM | POA: Diagnosis not present

## 2019-06-09 DIAGNOSIS — Z4931 Encounter for adequacy testing for hemodialysis: Secondary | ICD-10-CM | POA: Diagnosis not present

## 2019-06-10 DIAGNOSIS — R509 Fever, unspecified: Secondary | ICD-10-CM | POA: Diagnosis not present

## 2019-06-10 DIAGNOSIS — D509 Iron deficiency anemia, unspecified: Secondary | ICD-10-CM | POA: Diagnosis not present

## 2019-06-10 DIAGNOSIS — Z992 Dependence on renal dialysis: Secondary | ICD-10-CM | POA: Diagnosis not present

## 2019-06-10 DIAGNOSIS — N186 End stage renal disease: Secondary | ICD-10-CM | POA: Diagnosis not present

## 2019-06-10 DIAGNOSIS — D631 Anemia in chronic kidney disease: Secondary | ICD-10-CM | POA: Diagnosis not present

## 2019-06-10 DIAGNOSIS — Z4931 Encounter for adequacy testing for hemodialysis: Secondary | ICD-10-CM | POA: Diagnosis not present

## 2019-06-11 DIAGNOSIS — D631 Anemia in chronic kidney disease: Secondary | ICD-10-CM | POA: Diagnosis not present

## 2019-06-11 DIAGNOSIS — D509 Iron deficiency anemia, unspecified: Secondary | ICD-10-CM | POA: Diagnosis not present

## 2019-06-11 DIAGNOSIS — R509 Fever, unspecified: Secondary | ICD-10-CM | POA: Diagnosis not present

## 2019-06-11 DIAGNOSIS — Z992 Dependence on renal dialysis: Secondary | ICD-10-CM | POA: Diagnosis not present

## 2019-06-11 DIAGNOSIS — Z4931 Encounter for adequacy testing for hemodialysis: Secondary | ICD-10-CM | POA: Diagnosis not present

## 2019-06-11 DIAGNOSIS — N186 End stage renal disease: Secondary | ICD-10-CM | POA: Diagnosis not present

## 2019-06-12 DIAGNOSIS — E1122 Type 2 diabetes mellitus with diabetic chronic kidney disease: Secondary | ICD-10-CM | POA: Diagnosis not present

## 2019-06-12 DIAGNOSIS — N186 End stage renal disease: Secondary | ICD-10-CM | POA: Diagnosis not present

## 2019-06-12 DIAGNOSIS — Z992 Dependence on renal dialysis: Secondary | ICD-10-CM | POA: Diagnosis not present

## 2019-06-14 DIAGNOSIS — N186 End stage renal disease: Secondary | ICD-10-CM | POA: Diagnosis not present

## 2019-06-14 DIAGNOSIS — E8779 Other fluid overload: Secondary | ICD-10-CM | POA: Diagnosis not present

## 2019-06-14 DIAGNOSIS — K7689 Other specified diseases of liver: Secondary | ICD-10-CM | POA: Diagnosis not present

## 2019-06-14 DIAGNOSIS — Z4931 Encounter for adequacy testing for hemodialysis: Secondary | ICD-10-CM | POA: Diagnosis not present

## 2019-06-14 DIAGNOSIS — Z79899 Other long term (current) drug therapy: Secondary | ICD-10-CM | POA: Diagnosis not present

## 2019-06-14 DIAGNOSIS — N2581 Secondary hyperparathyroidism of renal origin: Secondary | ICD-10-CM | POA: Diagnosis not present

## 2019-06-14 DIAGNOSIS — I120 Hypertensive chronic kidney disease with stage 5 chronic kidney disease or end stage renal disease: Secondary | ICD-10-CM | POA: Diagnosis not present

## 2019-06-14 DIAGNOSIS — E1022 Type 1 diabetes mellitus with diabetic chronic kidney disease: Secondary | ICD-10-CM | POA: Diagnosis not present

## 2019-06-14 DIAGNOSIS — R17 Unspecified jaundice: Secondary | ICD-10-CM | POA: Diagnosis not present

## 2019-06-14 DIAGNOSIS — E440 Moderate protein-calorie malnutrition: Secondary | ICD-10-CM | POA: Diagnosis not present

## 2019-06-14 DIAGNOSIS — R509 Fever, unspecified: Secondary | ICD-10-CM | POA: Diagnosis not present

## 2019-06-14 DIAGNOSIS — D631 Anemia in chronic kidney disease: Secondary | ICD-10-CM | POA: Diagnosis not present

## 2019-06-14 DIAGNOSIS — Z992 Dependence on renal dialysis: Secondary | ICD-10-CM | POA: Diagnosis not present

## 2019-06-15 DIAGNOSIS — K7689 Other specified diseases of liver: Secondary | ICD-10-CM | POA: Diagnosis not present

## 2019-06-15 DIAGNOSIS — N186 End stage renal disease: Secondary | ICD-10-CM | POA: Diagnosis not present

## 2019-06-15 DIAGNOSIS — N2581 Secondary hyperparathyroidism of renal origin: Secondary | ICD-10-CM | POA: Diagnosis not present

## 2019-06-15 DIAGNOSIS — E440 Moderate protein-calorie malnutrition: Secondary | ICD-10-CM | POA: Diagnosis not present

## 2019-06-15 DIAGNOSIS — R509 Fever, unspecified: Secondary | ICD-10-CM | POA: Diagnosis not present

## 2019-06-15 DIAGNOSIS — Z992 Dependence on renal dialysis: Secondary | ICD-10-CM | POA: Diagnosis not present

## 2019-06-17 DIAGNOSIS — N186 End stage renal disease: Secondary | ICD-10-CM | POA: Diagnosis not present

## 2019-06-17 DIAGNOSIS — R509 Fever, unspecified: Secondary | ICD-10-CM | POA: Diagnosis not present

## 2019-06-17 DIAGNOSIS — N2581 Secondary hyperparathyroidism of renal origin: Secondary | ICD-10-CM | POA: Diagnosis not present

## 2019-06-17 DIAGNOSIS — K7689 Other specified diseases of liver: Secondary | ICD-10-CM | POA: Diagnosis not present

## 2019-06-17 DIAGNOSIS — E440 Moderate protein-calorie malnutrition: Secondary | ICD-10-CM | POA: Diagnosis not present

## 2019-06-17 DIAGNOSIS — Z992 Dependence on renal dialysis: Secondary | ICD-10-CM | POA: Diagnosis not present

## 2019-06-18 DIAGNOSIS — E440 Moderate protein-calorie malnutrition: Secondary | ICD-10-CM | POA: Diagnosis not present

## 2019-06-18 DIAGNOSIS — R509 Fever, unspecified: Secondary | ICD-10-CM | POA: Diagnosis not present

## 2019-06-18 DIAGNOSIS — K7689 Other specified diseases of liver: Secondary | ICD-10-CM | POA: Diagnosis not present

## 2019-06-18 DIAGNOSIS — Z992 Dependence on renal dialysis: Secondary | ICD-10-CM | POA: Diagnosis not present

## 2019-06-18 DIAGNOSIS — N2581 Secondary hyperparathyroidism of renal origin: Secondary | ICD-10-CM | POA: Diagnosis not present

## 2019-06-18 DIAGNOSIS — N186 End stage renal disease: Secondary | ICD-10-CM | POA: Diagnosis not present

## 2019-06-21 DIAGNOSIS — K7689 Other specified diseases of liver: Secondary | ICD-10-CM | POA: Diagnosis not present

## 2019-06-21 DIAGNOSIS — N186 End stage renal disease: Secondary | ICD-10-CM | POA: Diagnosis not present

## 2019-06-21 DIAGNOSIS — R509 Fever, unspecified: Secondary | ICD-10-CM | POA: Diagnosis not present

## 2019-06-21 DIAGNOSIS — N2581 Secondary hyperparathyroidism of renal origin: Secondary | ICD-10-CM | POA: Diagnosis not present

## 2019-06-21 DIAGNOSIS — E440 Moderate protein-calorie malnutrition: Secondary | ICD-10-CM | POA: Diagnosis not present

## 2019-06-21 DIAGNOSIS — Z992 Dependence on renal dialysis: Secondary | ICD-10-CM | POA: Diagnosis not present

## 2019-06-22 DIAGNOSIS — Z992 Dependence on renal dialysis: Secondary | ICD-10-CM | POA: Diagnosis not present

## 2019-06-22 DIAGNOSIS — N2581 Secondary hyperparathyroidism of renal origin: Secondary | ICD-10-CM | POA: Diagnosis not present

## 2019-06-22 DIAGNOSIS — K7689 Other specified diseases of liver: Secondary | ICD-10-CM | POA: Diagnosis not present

## 2019-06-22 DIAGNOSIS — R509 Fever, unspecified: Secondary | ICD-10-CM | POA: Diagnosis not present

## 2019-06-22 DIAGNOSIS — N186 End stage renal disease: Secondary | ICD-10-CM | POA: Diagnosis not present

## 2019-06-22 DIAGNOSIS — E440 Moderate protein-calorie malnutrition: Secondary | ICD-10-CM | POA: Diagnosis not present

## 2019-06-24 DIAGNOSIS — Z992 Dependence on renal dialysis: Secondary | ICD-10-CM | POA: Diagnosis not present

## 2019-06-24 DIAGNOSIS — E440 Moderate protein-calorie malnutrition: Secondary | ICD-10-CM | POA: Diagnosis not present

## 2019-06-24 DIAGNOSIS — N2581 Secondary hyperparathyroidism of renal origin: Secondary | ICD-10-CM | POA: Diagnosis not present

## 2019-06-24 DIAGNOSIS — N186 End stage renal disease: Secondary | ICD-10-CM | POA: Diagnosis not present

## 2019-06-24 DIAGNOSIS — R509 Fever, unspecified: Secondary | ICD-10-CM | POA: Diagnosis not present

## 2019-06-24 DIAGNOSIS — K7689 Other specified diseases of liver: Secondary | ICD-10-CM | POA: Diagnosis not present

## 2019-06-25 DIAGNOSIS — Z992 Dependence on renal dialysis: Secondary | ICD-10-CM | POA: Diagnosis not present

## 2019-06-25 DIAGNOSIS — N2581 Secondary hyperparathyroidism of renal origin: Secondary | ICD-10-CM | POA: Diagnosis not present

## 2019-06-25 DIAGNOSIS — R509 Fever, unspecified: Secondary | ICD-10-CM | POA: Diagnosis not present

## 2019-06-25 DIAGNOSIS — E440 Moderate protein-calorie malnutrition: Secondary | ICD-10-CM | POA: Diagnosis not present

## 2019-06-25 DIAGNOSIS — N186 End stage renal disease: Secondary | ICD-10-CM | POA: Diagnosis not present

## 2019-06-25 DIAGNOSIS — K7689 Other specified diseases of liver: Secondary | ICD-10-CM | POA: Diagnosis not present

## 2019-06-27 ENCOUNTER — Encounter (HOSPITAL_COMMUNITY): Payer: Self-pay | Admitting: *Deleted

## 2019-06-27 ENCOUNTER — Inpatient Hospital Stay (HOSPITAL_COMMUNITY): Payer: Medicare Other

## 2019-06-27 ENCOUNTER — Inpatient Hospital Stay (HOSPITAL_COMMUNITY)
Admission: EM | Admit: 2019-06-27 | Discharge: 2019-07-13 | DRG: 637 | Disposition: E | Payer: Medicare Other | Attending: Pulmonary Disease | Admitting: Pulmonary Disease

## 2019-06-27 ENCOUNTER — Emergency Department (HOSPITAL_COMMUNITY): Payer: Medicare Other

## 2019-06-27 DIAGNOSIS — Z7982 Long term (current) use of aspirin: Secondary | ICD-10-CM

## 2019-06-27 DIAGNOSIS — M1711 Unilateral primary osteoarthritis, right knee: Secondary | ICD-10-CM | POA: Diagnosis present

## 2019-06-27 DIAGNOSIS — I469 Cardiac arrest, cause unspecified: Secondary | ICD-10-CM | POA: Diagnosis not present

## 2019-06-27 DIAGNOSIS — I2510 Atherosclerotic heart disease of native coronary artery without angina pectoris: Secondary | ICD-10-CM | POA: Diagnosis not present

## 2019-06-27 DIAGNOSIS — E785 Hyperlipidemia, unspecified: Secondary | ICD-10-CM | POA: Diagnosis present

## 2019-06-27 DIAGNOSIS — Z87891 Personal history of nicotine dependence: Secondary | ICD-10-CM

## 2019-06-27 DIAGNOSIS — I959 Hypotension, unspecified: Secondary | ICD-10-CM | POA: Diagnosis not present

## 2019-06-27 DIAGNOSIS — I468 Cardiac arrest due to other underlying condition: Secondary | ICD-10-CM | POA: Diagnosis present

## 2019-06-27 DIAGNOSIS — E1022 Type 1 diabetes mellitus with diabetic chronic kidney disease: Secondary | ICD-10-CM | POA: Diagnosis present

## 2019-06-27 DIAGNOSIS — G931 Anoxic brain damage, not elsewhere classified: Secondary | ICD-10-CM | POA: Diagnosis present

## 2019-06-27 DIAGNOSIS — Z91048 Other nonmedicinal substance allergy status: Secondary | ICD-10-CM

## 2019-06-27 DIAGNOSIS — G936 Cerebral edema: Secondary | ICD-10-CM | POA: Diagnosis not present

## 2019-06-27 DIAGNOSIS — I4901 Ventricular fibrillation: Secondary | ICD-10-CM | POA: Diagnosis present

## 2019-06-27 DIAGNOSIS — J811 Chronic pulmonary edema: Secondary | ICD-10-CM | POA: Diagnosis not present

## 2019-06-27 DIAGNOSIS — Z89511 Acquired absence of right leg below knee: Secondary | ICD-10-CM

## 2019-06-27 DIAGNOSIS — Z794 Long term (current) use of insulin: Secondary | ICD-10-CM

## 2019-06-27 DIAGNOSIS — G9341 Metabolic encephalopathy: Secondary | ICD-10-CM | POA: Diagnosis present

## 2019-06-27 DIAGNOSIS — I132 Hypertensive heart and chronic kidney disease with heart failure and with stage 5 chronic kidney disease, or end stage renal disease: Secondary | ICD-10-CM | POA: Diagnosis present

## 2019-06-27 DIAGNOSIS — Z79899 Other long term (current) drug therapy: Secondary | ICD-10-CM

## 2019-06-27 DIAGNOSIS — I5032 Chronic diastolic (congestive) heart failure: Secondary | ICD-10-CM | POA: Diagnosis present

## 2019-06-27 DIAGNOSIS — Z452 Encounter for adjustment and management of vascular access device: Secondary | ICD-10-CM

## 2019-06-27 DIAGNOSIS — M19011 Primary osteoarthritis, right shoulder: Secondary | ICD-10-CM | POA: Diagnosis present

## 2019-06-27 DIAGNOSIS — E875 Hyperkalemia: Secondary | ICD-10-CM | POA: Diagnosis not present

## 2019-06-27 DIAGNOSIS — R220 Localized swelling, mass and lump, head: Secondary | ICD-10-CM | POA: Diagnosis not present

## 2019-06-27 DIAGNOSIS — R0789 Other chest pain: Secondary | ICD-10-CM | POA: Diagnosis not present

## 2019-06-27 DIAGNOSIS — I499 Cardiac arrhythmia, unspecified: Secondary | ICD-10-CM | POA: Diagnosis not present

## 2019-06-27 DIAGNOSIS — G935 Compression of brain: Secondary | ICD-10-CM | POA: Diagnosis present

## 2019-06-27 DIAGNOSIS — N2581 Secondary hyperparathyroidism of renal origin: Secondary | ICD-10-CM | POA: Diagnosis not present

## 2019-06-27 DIAGNOSIS — Z20822 Contact with and (suspected) exposure to covid-19: Secondary | ICD-10-CM | POA: Diagnosis present

## 2019-06-27 DIAGNOSIS — Z8674 Personal history of sudden cardiac arrest: Secondary | ICD-10-CM

## 2019-06-27 DIAGNOSIS — Z955 Presence of coronary angioplasty implant and graft: Secondary | ICD-10-CM

## 2019-06-27 DIAGNOSIS — R739 Hyperglycemia, unspecified: Secondary | ICD-10-CM | POA: Diagnosis not present

## 2019-06-27 DIAGNOSIS — R079 Chest pain, unspecified: Secondary | ICD-10-CM | POA: Diagnosis not present

## 2019-06-27 DIAGNOSIS — Z833 Family history of diabetes mellitus: Secondary | ICD-10-CM

## 2019-06-27 DIAGNOSIS — Z66 Do not resuscitate: Secondary | ICD-10-CM | POA: Diagnosis not present

## 2019-06-27 DIAGNOSIS — J9601 Acute respiratory failure with hypoxia: Secondary | ICD-10-CM | POA: Diagnosis present

## 2019-06-27 DIAGNOSIS — E1010 Type 1 diabetes mellitus with ketoacidosis without coma: Secondary | ICD-10-CM | POA: Diagnosis present

## 2019-06-27 DIAGNOSIS — Z8701 Personal history of pneumonia (recurrent): Secondary | ICD-10-CM

## 2019-06-27 DIAGNOSIS — Z8249 Family history of ischemic heart disease and other diseases of the circulatory system: Secondary | ICD-10-CM

## 2019-06-27 DIAGNOSIS — K219 Gastro-esophageal reflux disease without esophagitis: Secondary | ICD-10-CM | POA: Diagnosis present

## 2019-06-27 DIAGNOSIS — Z992 Dependence on renal dialysis: Secondary | ICD-10-CM | POA: Diagnosis not present

## 2019-06-27 DIAGNOSIS — I350 Nonrheumatic aortic (valve) stenosis: Secondary | ICD-10-CM | POA: Diagnosis present

## 2019-06-27 DIAGNOSIS — N186 End stage renal disease: Secondary | ICD-10-CM | POA: Diagnosis present

## 2019-06-27 DIAGNOSIS — Z888 Allergy status to other drugs, medicaments and biological substances status: Secondary | ICD-10-CM

## 2019-06-27 DIAGNOSIS — E1165 Type 2 diabetes mellitus with hyperglycemia: Secondary | ICD-10-CM | POA: Diagnosis not present

## 2019-06-27 DIAGNOSIS — I739 Peripheral vascular disease, unspecified: Secondary | ICD-10-CM | POA: Diagnosis not present

## 2019-06-27 LAB — I-STAT CHEM 8, ED
BUN: 93 mg/dL — ABNORMAL HIGH (ref 6–20)
Calcium, Ion: 1.01 mmol/L — ABNORMAL LOW (ref 1.15–1.40)
Chloride: 91 mmol/L — ABNORMAL LOW (ref 98–111)
Creatinine, Ser: 13.2 mg/dL — ABNORMAL HIGH (ref 0.61–1.24)
Glucose, Bld: >700 mg/dL (ref 70–99)
HCT: 41 % (ref 39.0–52.0)
Hemoglobin: 13.9 g/dL (ref 13.0–17.0)
Potassium: 8.4 mmol/L (ref 3.5–5.1)
Sodium: 120 mmol/L — ABNORMAL LOW (ref 135–145)
TCO2: 16 mmol/L — ABNORMAL LOW (ref 22–32)

## 2019-06-27 LAB — POCT I-STAT 7, (LYTES, BLD GAS, ICA,H+H)
Acid-base deficit: 14 mmol/L — ABNORMAL HIGH (ref 0.0–2.0)
Bicarbonate: 13.6 mmol/L — ABNORMAL LOW (ref 20.0–28.0)
Calcium, Ion: 1.04 mmol/L — ABNORMAL LOW (ref 1.15–1.40)
HCT: 35 % — ABNORMAL LOW (ref 39.0–52.0)
Hemoglobin: 11.9 g/dL — ABNORMAL LOW (ref 13.0–17.0)
O2 Saturation: 100 %
Patient temperature: 96.7
Potassium: 7 mmol/L (ref 3.5–5.1)
Sodium: 121 mmol/L — ABNORMAL LOW (ref 135–145)
TCO2: 15 mmol/L — ABNORMAL LOW (ref 22–32)
pCO2 arterial: 36.3 mmHg (ref 32.0–48.0)
pH, Arterial: 7.174 — CL (ref 7.350–7.450)
pO2, Arterial: 409 mmHg — ABNORMAL HIGH (ref 83.0–108.0)

## 2019-06-27 LAB — BASIC METABOLIC PANEL
Anion gap: 30 — ABNORMAL HIGH (ref 5–15)
BUN: 65 mg/dL — ABNORMAL HIGH (ref 6–20)
CO2: 12 mmol/L — ABNORMAL LOW (ref 22–32)
Calcium: 8.4 mg/dL — ABNORMAL LOW (ref 8.9–10.3)
Chloride: 83 mmol/L — ABNORMAL LOW (ref 98–111)
Creatinine, Ser: 12.99 mg/dL — ABNORMAL HIGH (ref 0.61–1.24)
GFR calc Af Amer: 5 mL/min — ABNORMAL LOW (ref >60)
GFR calc non Af Amer: 4 mL/min — ABNORMAL LOW (ref >60)
Glucose, Bld: 1063 mg/dL (ref 70–99)
Potassium: >7.5 mmol/L (ref 3.5–5.1)
Sodium: 125 mmol/L — ABNORMAL LOW (ref 135–145)

## 2019-06-27 LAB — SARS CORONAVIRUS 2 BY RT PCR (HOSPITAL ORDER, PERFORMED IN ~~LOC~~ HOSPITAL LAB): SARS Coronavirus 2: NEGATIVE

## 2019-06-27 LAB — APTT: aPTT: 28 seconds (ref 24–36)

## 2019-06-27 LAB — LACTIC ACID, PLASMA: Lactic Acid, Venous: >11 mmol/L (ref 0.5–1.9)

## 2019-06-27 LAB — CBC
HCT: 35.6 % — ABNORMAL LOW (ref 39.0–52.0)
Hemoglobin: 11.1 g/dL — ABNORMAL LOW (ref 13.0–17.0)
MCH: 32.6 pg (ref 26.0–34.0)
MCHC: 31.2 g/dL (ref 30.0–36.0)
MCV: 104.4 fL — ABNORMAL HIGH (ref 80.0–100.0)
Platelets: 265 10*3/uL (ref 150–400)
RBC: 3.41 MIL/uL — ABNORMAL LOW (ref 4.22–5.81)
RDW: 14.9 % (ref 11.5–15.5)
WBC: 10.3 10*3/uL (ref 4.0–10.5)
nRBC: 0 % (ref 0.0–0.2)

## 2019-06-27 LAB — TROPONIN I (HIGH SENSITIVITY): Troponin I (High Sensitivity): 511 ng/L (ref <18)

## 2019-06-27 LAB — PROTIME-INR
INR: 1.1 (ref 0.8–1.2)
Prothrombin Time: 14.1 seconds (ref 11.4–15.2)

## 2019-06-27 MED ORDER — DEXTROSE 50 % IV SOLN: 0.0000 mL | INTRAVENOUS | Status: DC | PRN

## 2019-06-27 MED ORDER — NOREPINEPHRINE 4 MG/250ML-% IV SOLN
0.0000 ug/min | INTRAVENOUS | Status: DC
  Administered 2019-06-27: 8 ug/min via INTRAVENOUS
  Administered 2019-06-28: 3 ug/min via INTRAVENOUS
  Administered 2019-06-28 – 2019-06-29 (×2): 4 ug/min via INTRAVENOUS
  Administered 2019-06-30 (×3): 10 ug/min via INTRAVENOUS
  Filled 2019-06-27 (×5): qty 250

## 2019-06-27 MED ORDER — SODIUM CHLORIDE 0.9 % IV SOLN: INTRAVENOUS | Status: DC

## 2019-06-27 MED ORDER — TICAGRELOR 90 MG PO TABS: 90.0000 mg | ORAL_TABLET | Freq: Two times a day (BID) | ORAL | Status: DC

## 2019-06-27 MED ORDER — SODIUM BICARBONATE 8.4 % IV SOLN
INTRAVENOUS | Status: AC | PRN
  Administered 2019-06-27: 100 meq via INTRAVENOUS

## 2019-06-27 MED ORDER — INSULIN REGULAR(HUMAN) IN NACL 100-0.9 UT/100ML-% IV SOLN
INTRAVENOUS | Status: DC
  Administered 2019-06-28: 1.5 [IU]/h via INTRAVENOUS
  Filled 2019-06-27 (×2): qty 100

## 2019-06-27 MED ORDER — SODIUM CHLORIDE 0.9 % IV BOLUS
1000.0000 mL | Freq: Once | INTRAVENOUS | Status: AC
  Administered 2019-06-27: 1000 mL via INTRAVENOUS

## 2019-06-27 MED ORDER — HEPARIN SODIUM (PORCINE) 1000 UNIT/ML DIALYSIS
40.0000 [IU]/kg | INTRAMUSCULAR | Status: DC | PRN
  Filled 2019-06-27: qty 5

## 2019-06-27 MED ORDER — DEXTROSE-NACL 5-0.45 % IV SOLN: INTRAVENOUS | Status: DC

## 2019-06-27 MED ORDER — PANTOPRAZOLE SODIUM 40 MG IV SOLR: 40.0000 mg | Freq: Every day | INTRAVENOUS | Status: DC

## 2019-06-27 MED ORDER — ASPIRIN 300 MG RE SUPP: 300.0000 mg | RECTAL | Status: DC

## 2019-06-27 MED ORDER — HEPARIN SODIUM (PORCINE) 5000 UNIT/ML IJ SOLN
5000.0000 [IU] | Freq: Three times a day (TID) | INTRAMUSCULAR | Status: DC
  Administered 2019-06-28 – 2019-06-30 (×6): 5000 [IU] via SUBCUTANEOUS
  Filled 2019-06-27 (×6): qty 1

## 2019-06-27 MED ORDER — INSULIN ASPART 100 UNIT/ML ~~LOC~~ SOLN
10.0000 [IU] | Freq: Once | SUBCUTANEOUS | Status: AC
  Administered 2019-06-27: 10 [IU] via INTRAVENOUS

## 2019-06-27 MED ORDER — CHLORHEXIDINE GLUCONATE CLOTH 2 % EX PADS
6.0000 | MEDICATED_PAD | Freq: Every day | CUTANEOUS | Status: DC
  Administered 2019-06-28 – 2019-06-30 (×3): 6 via TOPICAL

## 2019-06-27 MED ORDER — SODIUM CHLORIDE 0.9 % IV SOLN: INTRAVENOUS | Status: AC

## 2019-06-27 MED ORDER — EPINEPHRINE HCL 5 MG/250ML IV SOLN IN NS
INTRAVENOUS | Status: AC
  Filled 2019-06-27: qty 250

## 2019-06-27 MED ORDER — CALCIUM GLUCONATE 10 % IV SOLN
1.0000 g | Freq: Once | INTRAVENOUS | Status: AC
  Administered 2019-06-27: 1 g via INTRAVENOUS

## 2019-06-27 MED ORDER — SODIUM CHLORIDE 0.9 % IV SOLN: 100.0000 mL | INTRAVENOUS | Status: DC | PRN

## 2019-06-27 MED ORDER — STERILE WATER FOR INJECTION IV SOLN
INTRAVENOUS | Status: AC
  Filled 2019-06-27: qty 850

## 2019-06-27 MED ORDER — NOREPINEPHRINE 4 MG/250ML-% IV SOLN
INTRAVENOUS | Status: AC
  Filled 2019-06-27: qty 250

## 2019-06-27 MED ORDER — POLYETHYLENE GLYCOL 3350 17 G PO PACK: 17.0000 g | PACK | Freq: Every day | ORAL | Status: DC | PRN

## 2019-06-27 MED ORDER — DOCUSATE SODIUM 100 MG PO CAPS: 100.0000 mg | ORAL_CAPSULE | Freq: Two times a day (BID) | ORAL | Status: DC | PRN

## 2019-06-27 MED ORDER — HEPARIN SODIUM (PORCINE) 1000 UNIT/ML DIALYSIS: 1000.0000 [IU] | INTRAMUSCULAR | Status: DC | PRN

## 2019-06-27 MED ORDER — ASPIRIN EC 81 MG PO TBEC: 81.0000 mg | DELAYED_RELEASE_TABLET | Freq: Every day | ORAL | Status: DC

## 2019-06-27 NOTE — ED Notes (Signed)
Multiple attempts at IV without success

## 2019-06-27 NOTE — Consult Note (Signed)
Reason for Consult: Hyperkalemia, cardiac arrest in patient with ESRD Referring Physician: Collier Bullock, MD (CCM)  HPI: (History obtained from chart review/admitting provider as patient intubated and family unavailable at his side) 49 year old African-American man with past medical history significant for insulin-dependent diabetes mellitus, hypertension, diastolic heart failure, coronary artery disease status post PTCA-most recently DES to RCA 2 months ago, PAD/diabetic foot infection status post right BKA, history of SVT and prior history of cardiac arrest secondary to hyperkalemia.  He also has end-stage renal disease on home hemodialysis.  He was brought to the emergency room today by EMS after his wife found him collapsed in the evening after nausea and vomiting over the course of today.  EMS found him to be in ventricular fibrillation arrest and he was treated with several rounds of epinephrine, AED shocks, amiodarone, calcium, bicarbonate and intubated in the field for airway protection with ROSC after about 25 minutes of downtime.  Initial EKG in the emergency room was significant for peaked T waves and i-STAT labs showed potassium 8.4 with glucose >700.  Repeat potassium was 7.0 and basic metabolic panel pending at the time of dictation.  Hemodialysis prescription: 4 days a week (M/T/Th/F), NxStage, 170 cartridge, 2K/45 lactate, 60 L volume for treatment with 64% full fraction.  BFR 450, EDW 102.5 kg, right brachiocephalic fistula, 15 G needles, heparin 3000 unit bolus.  Calcitriol 2 mcg daily, VELPHORO 1000 mg 3 times daily AC, calcium acetate 1334 mg 3 times daily AC.  Past Medical History:  Diagnosis Date  . Aortic stenosis    Very mild in 05/2007  . Arthritis    "right shoulder; right knee; feel like I've got it all over" (11/28/2015)  . CHF (congestive heart failure) ([place])   . Degenerative joint disease    Left TKA  . Diastolic heart failure (HCC)    LVEF 65-70% with grade 2  diastolic dysfunction  . ESRD (end stage renal disease) on dialysis [facility])    (as of 09/24/2017) pt does HD at home on a M/Tu/Th/F schedule.  . Essential hypertension, benign 2000   LVH  . GERD (gastroesophageal reflux disease)   . HCAP (healthcare-associated pneumonia) 11/28/2015  . Hyperlipidemia 2000  . Loculated pleural effusion 11/28/2015   [name] 11/28/2015  . Pneumonia 12/2013; 05/2014  . PSVT (paroxysmal supraventricular tachycardia) (HCC)    Nonsustained; asymptomatic; diagnosed by event recorder in 2006  . Tobacco abuse, in remission    20 pack years; discontinued in 1985; bronchitic changes on chest x-ray and 2009  . Type 1 diabetes mellitus ([place]) 1990    Past Surgical History:  Procedure Laterality Date  . AMPUTATION Right 06/06/2017   Procedure: RIGHT BELOW KNEE AMPUTATION;  Surgeon: [name], MD;  Location: [place];  Service: Orthopedics;  Laterality: Right;  . APPLICATION OF WOUND VAC Right 07/25/2017   Procedure: APPLICATION OF WOUND VAC;  Surgeon: [name], MD;  Location: [place];  Service: Orthopedics;  Laterality: Right;  . AV FISTULA PLACEMENT Right 06/23/2014   Procedure: ARTERIOVENOUS (AV) FISTULA CREATION;  Surgeon: Angelia Mould, MD;  Location: [place];  Service: Vascular;  Laterality: Right;  . CARDIAC CATHETERIZATION     [facility]- evaluation for Kidney/ pancreas transplant  . EYE SURGERY Bilateral    "for glaucoma"  . INGUINAL HERNIA REPAIR Right    As a child  . IR DIALY SHUNT INTRO NEEDLE/INTRACATH INITIAL W/IMG RIGHT Right 09/29/2017  . LOWER EXTREMITY ANGIOGRAPHY N/A 05/27/2017   Procedure: LOWER EXTREMITY ANGIOGRAPHY;  Surgeon: [name], MD;  Location: [place] [facility];  Service: Cardiovascular;  Laterality: N/A;  . STUMP REVISION Right 07/25/2017   Procedure: REVISION RIGHT BELOW KNEE AMPUTATION;;  Surgeon: [name], MD;  Location: [place];  Service: Orthopedics;  Laterality: Right;  . WISDOM TOOTH EXTRACTION  1987    Family  History  Problem Relation Age of Onset  . Diabetes Father   . Hypertension Father   . Diabetes Mother   . Hypertension Mother     Social History:  reports that he has never smoked. He has never used smokeless tobacco. He reports previous alcohol use. He reports that he does not use drugs.  Allergies:  Allergies  Allergen Reactions  . Atorvastatin Other (See Comments)    Muscle pain   . Minoxidil Other (See Comments)    Fluid retention   . Adhesive [Tape] Other (See Comments)    MUST USE "SILK TAPE," AS ANY OTHER "BURNS" THE SKIN  . Statins Other (See Comments)    Muscle pain    Medications:  Scheduled: . [START ON 06/28/2019] aspirin EC  81 mg Oral Daily  . [START ON 06/28/2019] Chlorhexidine Gluconate Cloth  6 each Topical Q0600  . EPINEPHrine NaCl      . heparin  5,000 Units Subcutaneous Q8H  . pantoprazole (PROTONIX) IV  40 mg Intravenous QHS  . ticagrelor  90 mg Oral BID    BMP Latest Ref Rng & Units 07/02/2019 07/07/2019 12/01/2018  Glucose 70 - 99 mg/dL - >700(HH) 343(H)  BUN 6 - 20 mg/dL - 93(H) 50(H)  Creatinine 0.61 - 1.24 mg/dL - 13.20(H) 11.25(H)  BUN/Creat Ratio 6 - 22 (calc) - - -  Sodium 135 - 145 mmol/L 121(L) 120(L) 131(L)  Potassium 3.5 - 5.1 mmol/L 7.0(HH) 8.4(HH) 4.1  Chloride 98 - 111 mmol/L - 91(L) 92(L)  CO2 22 - 32 mmol/L - - 27  Calcium 8.9 - 10.3 mg/dL - - 8.5(L)   CBC Latest Ref Rng & Units 07/09/2019 06/24/2019 07/12/2019  WBC 4.0 - 10.5 K/uL 10.3 - -  Hemoglobin 13.0 - 17.0 g/dL 11.1(L) 11.9(L) 13.9  Hematocrit 39.0 - 52.0 % 35.6(L) 35.0(L) 41.0  Platelets 150 - 400 K/uL 265 - -     DG Chest Port 1 View  Result Date: 07/04/2019 CLINICAL DATA:  Cardiac arrest at home EXAM: PORTABLE CHEST 1 VIEW COMPARISON:  December 01, 2018 FINDINGS: There is cardiomegaly. ETT is 2.2 cm above the carina. NG tube is seen below the diaphragm. There is prominence of the central pulmonary vasculature. No large airspace consolidation or pleural effusion. Streaky  atelectasis seen in the right mid lung. Vascular stent overlying the right axilla. No acute osseous abnormality. IMPRESSION: ET tube and NG tube in satisfactory position. Cardiomegaly and pulmonary vascular congestion Electronically Signed   By: Prudencio Pair M.D.   On: 06/25/2019 21:31    Review of Systems  Unable to perform ROS: Intubated   Blood pressure (!) 106/42, pulse (!) 53, temperature (!) 96.7 F (35.9 C), temperature source Temporal, resp. rate (!) 33, weight 103.3 kg, SpO2 100 %. Physical Exam  Nursing note and vitals reviewed. Constitutional: He appears well-developed and well-nourished.  Intubated, unresponsive  HENT:  Head: Normocephalic and atraumatic.  Nose: Nose normal.  Intubated  Eyes: Conjunctivae are normal.  Neck: No JVD present.  Cardiovascular: Regular rhythm and normal heart sounds.  No murmur heard. Regular bradycardia  Respiratory: Effort normal and breath sounds normal. He has no wheezes.  He has no rales.  GI: Soft. Bowel sounds are normal. There is no abdominal tenderness. There is no rebound and no guarding.  Musculoskeletal:        General: No edema.     Cervical back: Normal range of motion and neck supple.     Comments: Right leg status post BKA with prosthesis in place.  Left intraosseous line noted.  No pitting edema.  Right brachiocephalic fistula pulsatile with 2 distinct aneurysms.  Neurological:  Unresponsive  Skin: Skin is warm and dry. No rash noted.    Assessment/Plan: 1.  Status post ventricular fibrillation arrest: Suspected to be secondary to hyperkalemia for which he has had prolonged resuscitative phase/downtime of about 25 minutes.  Intubated in the field and currently on low-dose pressor-Levophed.  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  Intubated for respiratory failure. 2.  Hyperkalemia: Plausibly may be associated with his  significant hyperglycemia however, with his prior history and sequence of events, I suspect that he has significant hyperkalemia that may have induced his V. fib arrest.  Emergent hemodialysis will be ordered via his right brachiocephalic fistula with the goal to keep him fluid even at this time but lower potassium promptly. 3.  End-stage renal disease: On home hemodialysis 4 days a week and will undertake emergent hemodialysis at this time for hyperkalemia.  I will discontinue intravenous fluids upon beginning dialysis-he has received 1 L fluid bolus and is currently on sodium bicarbonate infusion along with normal saline. 4.  Hypertension: At home usually he has significantly elevated blood pressures with intradialytic lability.  At this time he is hypotensive and on low-dose pressors. 5.  Hyperglycemia: With history of type 1 diabetes mellitus, started on insulin drip after initial insulin dosing for critical hyperkalemia/hyperglycemia. 6.  Coronary artery disease status post PTCA: Restarted back on aspirin and Brilinta 7.  Secondary hyperparathyroidism: Intubated at this time and currently n.p.o.  Hold off on binders/vitamin D receptor analog at this time.  Braylon Lemmons K. 07/12/2019, 10:29 PM

## 2019-06-27 NOTE — ED Notes (Signed)
Brought the family back to see pt.  Answered questions.

## 2019-06-27 NOTE — ED Notes (Signed)
Code cool ordered, Ice applied.  Critical care bedside inserting ART line

## 2019-06-27 NOTE — ED Triage Notes (Addendum)
Pt from home by [facility] as witness arrest by wife. Pt was receiving home dialysis, started complaining of nausea and collapsed to wheelchair. EMS reported total cpr time 25 mins; received epi x 9, 300mg  amiodarone, calcium, and bicarb; shocked x 7 for vfib.  IO to L leg (R bka), L sided EJ; dialysis fistula to R arm.  Pt has pulses on arrival to ED and taking spontaneous breaths.

## 2019-06-27 NOTE — ED Provider Notes (Signed)
[facility] EMERGENCY DEPARTMENT Provider Note   CSN: [id number] Arrival date & time: 07/06/2019  2054     History Chief Complaint  Patient presents with  . Cardiac Arrest    Lance [name] is a 49 y.o. male.  49 year old male with prior medical history as detailed below presents for evaluation following cardiac arrest.  EMS and the patient's wife provide history.  Patient had apparently suffered from nausea and vomiting over the course of today.  While at home the patient had a witnessed cardiac arrest.  Wife notified EMS.  EMS found the patient to be in V. fib arrest.  Patient had approximately 30 minutes of CPR prior to arrival.  ROSC was obtained shortly before arrival.  Patient's treatment in the field included multiple shocks, epinephrine, amiodarone, calcium, bicarb, intubation, and access.  On arrival to the ED the patient is being bagged through his ET tube.  He does have some spontaneous respiration attempts over the ventilations.  Pulse is present.  Patient's systolic blood pressure is noted to be in the 90s.  Initial EKG demonstrates significant peaks on his T waves.  This is different than prior.  Shortly after arrival wife of the patient provides additional history that the patient has had prior cardiac arrest secondary to hyperkalemia.  The patient uses home hemodialysis.  He was due for his next home hemodialysis session tomorrow.  The history is provided by the patient and medical records.  Cardiac Arrest Witnessed by:  Family member Time before BLS initiated:  1-2 minutes Time before ALS initiated:  3-5 minutes Condition upon EMS arrival:  Unresponsive Pulse:  Absent Initial cardiac rhythm per EMS:  Ventricular fibrillation Treatments prior to arrival:  ACLS protocol Medications given prior to ED:  Epinephrine Airway:  Bag valve mask and intubation prior to arrival Rhythm on admission to ED:  Sinus bradycardia Associated symptoms: vomiting         Past Medical History:  Diagnosis Date  . Aortic stenosis    Very mild in 05/2007  . Arthritis    "right shoulder; right knee; feel like I've got it all over" (11/28/2015)  . CHF (congestive heart failure) ([place])   . Degenerative joint disease    Left TKA  . Diastolic heart failure (HCC)    LVEF 65-70% with grade 2 diastolic dysfunction  . ESRD (end stage renal disease) on dialysis [facility])    (as of 09/24/2017) pt does HD at home on a M/Tu/Th/F schedule.  . Essential hypertension, benign 2000   LVH  . GERD (gastroesophageal reflux disease)   . HCAP (healthcare-associated pneumonia) 11/28/2015  . Hyperlipidemia 2000  . Loculated pleural effusion 11/28/2015   [name] 11/28/2015  . Pneumonia 12/2013; 05/2014  . PSVT (paroxysmal supraventricular tachycardia) (HCC)    Nonsustained; asymptomatic; diagnosed by event recorder in 2006  . Tobacco abuse, in remission    20 pack years; discontinued in 1985; bronchitic changes on chest x-ray and 2009  . Type 1 diabetes mellitus ([place]) 1990    Patient Active Problem List   Diagnosis Date Noted  . Vitamin D deficiency 06/09/2018  . 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) 64/40/3474  . Cardiac arrest ([place]) 09/24/2017  . Acute respiratory failure with hypoxia ([place]) 08/07/2017  . Respiratory distress 08/07/2017  . Dehiscence of amputation stump ([place]) 07/17/2017  . Benign essential HTN   . Chronic diastolic (congestive) heart failure ([place])   . Labile blood glucose   . Labile blood  pressure   . Vomiting   . Phantom limb pain ([place])   . Poorly controlled type 2 diabetes mellitus with peripheral neuropathy ([place])   . Unilateral complete BKA, right, sequela ([place])   . Amputation of right lower extremity below knee ([place]) 06/10/2017  . ESRD on dialysis ([place])   . Chronic diastolic congestive heart failure ([place])   . History of PSVT (paroxysmal supraventricular tachycardia)   . Diabetes mellitus type 2 in nonobese  (HCC)   . Post-operative pain   . Acute blood loss anemia   . Anemia of chronic disease   . Leukocytosis   . Tachycardia   . History of right below knee amputation ([place]) 06/06/2017  . PVD (peripheral vascular disease) ([place]) 05/13/2017  . Bilateral pleural effusion 11/28/2015  . Loculated pleural effusion 11/28/2015  . HCAP (healthcare-associated pneumonia) 11/28/2015  . ESRD (end stage renal disease) on dialysis ([place]) 11/12/2015  . Hyperkalemia 11/12/2015  . Shoulder pain, acute 11/12/2015  . Nausea & vomiting 11/12/2015  . Diarrhea 11/12/2015  . Hypertensive urgency 05/05/2014  . Nephrotic syndrome 12/29/2013  . Hyponatremia 12/28/2013  . Acute diastolic heart failure ([place]) 12/28/2013  . Hypoglycemia 12/28/2013  . Dyspnea   . Acute renal failure syndrome ([place])   . Diabetic ketoacidosis without coma associated with type 1 diabetes mellitus ([place])   . Demand ischemia ([place]) 12/24/2013  . DKA (diabetic ketoacidoses) ([place]) 12/23/2013  . CAP (community acquired pneumonia) 12/23/2013  . Sepsis due to pneumonia ([place]) 12/23/2013  . Metabolic acidemia 16/11/9602  . Acute renal failure ([place]) 12/23/2013  . Diabetic ketoacidosis ([place]) 12/23/2013  . Chronic diastolic heart failure ([place]) 04/14/2013  . PSVT (paroxysmal supraventricular tachycardia) ([place]) 04/14/2013  . Bilateral leg edema 03/26/2013  . Chronic kidney disease, stage [name] (moderate) 09/29/2011  . Chest pain 09/26/2010  . Type 1 diabetes mellitus with end-stage renal disease ([place]) 09/26/2010  . Essential hypertension, benign 09/26/2010  . Mixed hyperlipidemia 09/26/2010    Past Surgical History:  Procedure Laterality Date  . AMPUTATION Right 06/06/2017   Procedure: RIGHT BELOW KNEE AMPUTATION;  Surgeon: [name], MD;  Location: [facility];  Service: Orthopedics;  Laterality: Right;  . APPLICATION OF WOUND VAC Right 07/25/2017   Procedure: APPLICATION OF WOUND VAC;  Surgeon: [name], MD;  Location: [place];  Service:  Orthopedics;  Laterality: Right;  . AV FISTULA PLACEMENT Right 06/23/2014   Procedure: ARTERIOVENOUS (AV) FISTULA CREATION;  Surgeon: Angelia Mould, MD;  Location: [facility];  Service: Vascular;  Laterality: Right;  . CARDIAC CATHETERIZATION     [facility]- evaluation for Kidney/ pancreas transplant  . EYE SURGERY Bilateral    "for glaucoma"  . INGUINAL HERNIA REPAIR Right    As a child  . IR DIALY SHUNT INTRO NEEDLE/INTRACATH INITIAL W/IMG RIGHT Right 09/29/2017  . LOWER EXTREMITY ANGIOGRAPHY N/A 05/27/2017   Procedure: LOWER EXTREMITY ANGIOGRAPHY;  Surgeon: [name], MD;  Location: [facility];  Service: Cardiovascular;  Laterality: N/A;  . STUMP REVISION Right 07/25/2017   Procedure: REVISION RIGHT BELOW KNEE AMPUTATION;;  Surgeon: [name], MD;  Location: [facility];  Service: Orthopedics;  Laterality: Right;  . WISDOM TOOTH EXTRACTION  1987       Family History  Problem Relation Age of Onset  . Diabetes Father   . Hypertension Father   . Diabetes Mother   . Hypertension Mother     Social History   Tobacco Use  . Smoking status: Never Smoker  .  Smokeless tobacco: Never Used  Substance Use Topics  . Alcohol use: Not Currently    Comment: 11/28/2015 "used to drink; stopped ~ 2 yr ago"  . Drug use: No    Home Medications Prior to Admission medications   Medication Sig Start Date End Date Taking? Authorizing Provider  amLODipine (NORVASC) 5 MG tablet Take 5 mg by mouth daily.  08/11/18   [provider]  aspirin EC 81 MG tablet Take 81 mg by mouth daily.    [provider]  calcitRIOL (ROCALTROL) 0.25 MCG capsule Take 1 capsule (0.25 mcg total) by mouth every Monday, Wednesday, and Friday with hemodialysis. Patient taking differently: Take 0.5 mcg by mouth See admin instructions. Take 0.25 mcg by mouth once a day on Mon/Wed/Fri with home dialysis 06/18/17   Angiulli, Lavon Paganini, PA-C  Continuous Blood Gluc Receiver (FREESTYLE LIBRE 2 READER SYSTM)  DEVI 1 Piece by Does not apply route as directed. 10/26/18   Cassandria Anger, MD  Continuous Blood Gluc Sensor (FREESTYLE LIBRE 14 DAY SENSOR) MISC Inject 1 each into the skin every 14 (fourteen) days. Use as directed. 10/26/18   Cassandria Anger, MD  doxycycline (VIBRAMYCIN) 100 MG capsule Take 1 capsule (100 mg total) by mouth 2 (two) times daily. 12/01/18   Noemi Chapel, MD  glucose blood (ACCU-CHEK GUIDE) test strip Use as instructed 4 x daily. E11.65 02/19/18   Cassandria Anger, MD  Insulin Glargine (LANTUS SOLOSTAR) 100 UNIT/ML Solostar Pen Inject 30 Units into the skin at bedtime. 03/05/19   Cassandria Anger, MD  insulin lispro (HUMALOG) 100 UNIT/ML KwikPen Inject 0.14-0.17 mLs (14-17 Units total) into the skin 3 (three) times daily before meals. 03/05/19   Cassandria Anger, MD  metoprolol succinate (TOPROL-XL) 25 MG 24 hr tablet Take 25 mg by mouth every evening.  08/11/18   [provider]  nitroGLYCERIN (NITROSTAT) 0.4 MG SL tablet Place 0.4 mg under the tongue every 5 (five) minutes as needed for chest pain.  08/20/18 08/20/19  [provider]  pantoprazole (PROTONIX) 40 MG tablet TAKE 1 TABLET BY MOUTH EVERY DAY Patient taking differently: Take 40 mg by mouth daily.  09/08/17   [name], MD  pravastatin (PRAVACHOL) 10 MG tablet Take 10 mg by mouth every evening.  09/14/18   [provider]  sucroferric oxyhydroxide (VELPHORO) 500 MG chewable tablet Chew 3 tablets (1,500 mg total) by mouth 3 (three) times daily. 06/18/17   Angiulli, Lavon Paganini, PA-C  ticagrelor (BRILINTA) 90 MG TABS tablet Take 90 mg by mouth 2 (two) times daily.  08/20/18 08/20/19  [provider]    Allergies    Atorvastatin, Minoxidil, Adhesive [tape], and Statins  Review of Systems   Review of Systems  Gastrointestinal: Positive for vomiting.  All other systems reviewed and are negative.   Physical Exam Updated Vital Signs BP (!) 106/42   Pulse (!) 53   Temp  (!) 96.7 F (35.9 C) (Temporal)   Resp (!) 33   Wt 103.3 kg   SpO2 100%   BMI 28.46 kg/m   Physical Exam Vitals and nursing note reviewed.  Constitutional:      General: He is not in acute distress.    Appearance: He is well-developed.     Comments: Intubated Ventilations in progress Some spontaneous respirations attempted over ventilation.  Pulse is present  Sinus bradycardia on the monitor    HENT:     Head: Normocephalic and atraumatic.  Eyes:  Conjunctiva/sclera: Conjunctivae normal.     Pupils: Pupils are equal, round, and reactive to light.  Cardiovascular:     Rate and Rhythm: Normal rate and regular rhythm.     Heart sounds: Normal heart sounds.  Pulmonary:     Effort: No respiratory distress.     Comments: Good breath sounds with ventilations bilaterally Abdominal:     General: Abdomen is flat. There is no distension.     Palpations: Abdomen is soft.     Tenderness: There is no abdominal tenderness.  Musculoskeletal:        General: No deformity.     Cervical back: Normal range of motion and neck supple.  Skin:    General: Skin is warm and dry.  Neurological:     Comments: Post cardiac arrest, intubated, pupils fixed and dilated, with some spontaneous respirations noted over ventilations through ET tube     ED Results / Procedures / Treatments   Labs (all labs ordered are listed, but only abnormal results are displayed) Labs Reviewed  CBC - Abnormal; Notable for the following components:      Result Value   RBC 3.41 (*)    Hemoglobin 11.1 (*)    HCT 35.6 (*)    MCV 104.4 (*)    All other components within normal limits  I-STAT CHEM 8, ED - Abnormal; Notable for the following components:   Sodium 120 (*)    Potassium 8.4 (*)    Chloride 91 (*)    BUN 93 (*)    Creatinine, Ser 13.20 (*)    Glucose, Bld >700 (*)    Calcium, Ion 1.01 (*)    TCO2 16 (*)    All other components within normal limits  POCT I-STAT 7, (LYTES, BLD GAS, ICA,H+H) -  Abnormal; Notable for the following components:   pH, Arterial 7.174 (*)    pO2, Arterial 409 (*)    Bicarbonate 13.6 (*)    TCO2 15 (*)    Acid-base deficit 14.0 (*)    Sodium 121 (*)    Potassium 7.0 (*)    Calcium, Ion 1.04 (*)    HCT 35.0 (*)    Hemoglobin 11.9 (*)    All other components within normal limits  SARS CORONAVIRUS 2 BY RT PCR (HOSPITAL ORDER, [facility])  APTT  PROTIME-INR  BLOOD GAS, ARTERIAL  BASIC METABOLIC PANEL  LACTIC ACID, PLASMA  BLOOD GAS, VENOUS  HIV ANTIBODY (ROUTINE TESTING W REFLEX)  CBC  BASIC METABOLIC PANEL  BLOOD GAS, ARTERIAL  MAGNESIUM  PHOSPHORUS  BASIC METABOLIC PANEL  BASIC METABOLIC PANEL  PROTIME-INR  APTT  BLOOD GAS, ARTERIAL  CBC  CREATININE, SERUM  CBG MONITORING, ED  CBG MONITORING, ED  TROPONIN I (HIGH SENSITIVITY)    EKG EKG Interpretation  Date/Time:  Sunday Jun 27 2019 20:58:14 EDT Ventricular Rate:  51 PR Interval:    QRS Duration: 164 QT Interval:  560 QTC Calculation: 516 R Axis:   94 Text Interpretation: Atrial fibrillation RBBB and LPFB Repol abnrm suggests ischemia, anterolateral Confirmed by Dene Gentry [phone]) on 07/04/2019 9:14:09 PM   Radiology DG Chest Port 1 View  Result Date: 07/04/2019 CLINICAL DATA:  Cardiac arrest at home EXAM: PORTABLE CHEST 1 VIEW COMPARISON:  December 01, 2018 FINDINGS: There is cardiomegaly. ETT is 2.2 cm above the carina. NG tube is seen below the diaphragm. There is prominence of the central pulmonary vasculature. No large airspace consolidation or pleural effusion. Streaky atelectasis seen  in the right mid lung. Vascular stent overlying the right axilla. No acute osseous abnormality. IMPRESSION: ET tube and NG tube in satisfactory position. Cardiomegaly and pulmonary vascular congestion Electronically Signed   By: Prudencio Pair M.D.   On: 07/01/2019 21:31    Procedures Procedures (including critical care time) CRITICAL CARE Performed by: Valarie Merino   Total critical care time: 45 minutes  Critical care time was exclusive of separately billable procedures and treating other patients.  Critical care was necessary to treat or prevent imminent or life-threatening deterioration.  Critical care was time spent personally by me on the following activities: development of treatment plan with patient and/or surrogate as well as nursing, discussions with consultants, evaluation of patient's response to treatment, examination of patient, obtaining history from patient or surrogate, ordering and performing treatments and interventions, ordering and review of laboratory studies, ordering and review of radiographic studies, pulse oximetry and re-evaluation of patient's condition.   Medications Ordered in ED Medications  norepinephrine (LEVOPHED) 4-5 MG/250ML-% infusion SOLN (has no administration in time range)  EPINEPHrine NaCl 4-0.9 MG/250ML-% premix infusion (has no administration in time range)  sodium bicarbonate 150 mEq in sterile water 1,000 mL infusion ( Intravenous New Bag/Given 07/08/2019 2143)  norepinephrine (LEVOPHED) 4mg  in 255mL premix infusion (8 mcg/min Intravenous New Bag/Given 06/18/2019 2110)  insulin regular, human (MYXREDLIN) 100 units/ 100 mL infusion (has no administration in time range)  0.9 %  sodium chloride infusion (has no administration in time range)  dextrose 5 %-0.45 % sodium chloride infusion ( Intravenous New Bag/Given 06/26/2019 2158)  dextrose 50 % solution 0-50 mL (has no administration in time range)  aspirin EC tablet 81 mg (has no administration in time range)  ticagrelor (BRILINTA) tablet 90 mg (has no administration in time range)  docusate sodium (COLACE) capsule 100 mg (has no administration in time range)  polyethylene glycol (MIRALAX / GLYCOLAX) packet 17 g (has no administration in time range)  heparin injection 5,000 Units (has no administration in time range)  pantoprazole (PROTONIX) injection 40 mg  (has no administration in time range)  0.9 %  sodium chloride infusion (has no administration in time range)  calcium gluconate inj 10% (1 g) URGENT USE ONLY! (1 g Intravenous Given 06/12/2019 2213)  sodium chloride 0.9 % bolus 1,000 mL (0 mLs Intravenous Stopped 07/12/2019 2140)  sodium bicarbonate injection (100 mEq Intravenous Given 07/03/2019 2103)  insulin aspart (novoLOG) injection 10 Units (10 Units Intravenous Given 07/03/2019 2205)    ED Course  I have reviewed the triage vital signs and the nursing notes.  Pertinent labs & imaging results that were available during my care of the patient were reviewed by me and considered in my medical decision making (see chart for details).    MDM Rules/Calculators/A&P                      MDM  Screen complete  Lance [name] was evaluated in Emergency Department on 06/14/2019 for the symptoms described in the history of present illness. He was evaluated in the context of the global COVID-19 pandemic, which necessitated consideration that the patient might be at risk for infection with the SARS-CoV-2 virus that causes COVID-19. Institutional protocols and algorithms that pertain to the evaluation of patients at risk for COVID-19 are in a state of rapid change based on information released by regulatory bodies including the CDC and federal and state organizations. These policies and algorithms were followed during the  patient's care in the ED.  Patient is presenting post cardiac arrest.  Patient was intubated in the field.  Patient's presentation is particular concerning given presence of peaked T waves on initial EKG and history of HD.  Presumed hyperkalemia treated on arrival with additional amp of bicarb and initiation of bicarb drip.  Cardiology [facility] Done) is aware of case and agrees with plan of care.  They do not feel that cardiac intervention is indicated at this time.  Intensive care service is aware of case and will evaluate for  admission.  Nephrology [facility]) services were case and will evaluate for emergent CVVHD.  The wife of the family was updated regarding the progression of the care of her husband.  The wife understands the critical nature of her husband's disease.  Final Clinical Impression(s) / ED Diagnoses Final diagnoses:  Cardiac arrest [facility])    Rx / DC Orders ED Discharge Orders    None       Valarie Merino, MD 07/06/2019 2222

## 2019-06-28 ENCOUNTER — Inpatient Hospital Stay (HOSPITAL_COMMUNITY): Payer: Medicare Other

## 2019-06-28 ENCOUNTER — Encounter (HOSPITAL_COMMUNITY): Payer: Self-pay | Admitting: *Deleted

## 2019-06-28 ENCOUNTER — Other Ambulatory Visit: Payer: Self-pay

## 2019-06-28 DIAGNOSIS — I469 Cardiac arrest, cause unspecified: Secondary | ICD-10-CM

## 2019-06-28 DIAGNOSIS — I350 Nonrheumatic aortic (valve) stenosis: Secondary | ICD-10-CM

## 2019-06-28 DIAGNOSIS — E875 Hyperkalemia: Secondary | ICD-10-CM

## 2019-06-28 DIAGNOSIS — Z452 Encounter for adjustment and management of vascular access device: Secondary | ICD-10-CM

## 2019-06-28 DIAGNOSIS — E1110 Type 2 diabetes mellitus with ketoacidosis without coma: Secondary | ICD-10-CM

## 2019-06-28 LAB — GLUCOSE, CAPILLARY
Glucose-Capillary: 154 mg/dL — ABNORMAL HIGH (ref 70–99)
Glucose-Capillary: 155 mg/dL — ABNORMAL HIGH (ref 70–99)
Glucose-Capillary: 161 mg/dL — ABNORMAL HIGH (ref 70–99)
Glucose-Capillary: 171 mg/dL — ABNORMAL HIGH (ref 70–99)
Glucose-Capillary: 178 mg/dL — ABNORMAL HIGH (ref 70–99)
Glucose-Capillary: 181 mg/dL — ABNORMAL HIGH (ref 70–99)
Glucose-Capillary: 182 mg/dL — ABNORMAL HIGH (ref 70–99)
Glucose-Capillary: 197 mg/dL — ABNORMAL HIGH (ref 70–99)
Glucose-Capillary: 213 mg/dL — ABNORMAL HIGH (ref 70–99)
Glucose-Capillary: 217 mg/dL — ABNORMAL HIGH (ref 70–99)
Glucose-Capillary: 249 mg/dL — ABNORMAL HIGH (ref 70–99)
Glucose-Capillary: 273 mg/dL — ABNORMAL HIGH (ref 70–99)
Glucose-Capillary: 288 mg/dL — ABNORMAL HIGH (ref 70–99)
Glucose-Capillary: 295 mg/dL — ABNORMAL HIGH (ref 70–99)
Glucose-Capillary: 315 mg/dL — ABNORMAL HIGH (ref 70–99)
Glucose-Capillary: 356 mg/dL — ABNORMAL HIGH (ref 70–99)
Glucose-Capillary: 517 mg/dL (ref 70–99)
Glucose-Capillary: >600 mg/dL (ref 70–99)

## 2019-06-28 LAB — BASIC METABOLIC PANEL
Anion gap: 30 — ABNORMAL HIGH (ref 5–15)
Anion gap: 22 — ABNORMAL HIGH (ref 5–15)
Anion gap: 15 (ref 5–15)
Anion gap: 13 (ref 5–15)
Anion gap: 15 (ref 5–15)
BUN: 65 mg/dL — ABNORMAL HIGH (ref 6–20)
BUN: 35 mg/dL — ABNORMAL HIGH (ref 6–20)
BUN: 22 mg/dL — ABNORMAL HIGH (ref 6–20)
BUN: 22 mg/dL — ABNORMAL HIGH (ref 6–20)
BUN: 24 mg/dL — ABNORMAL HIGH (ref 6–20)
CO2: 10 mmol/L — ABNORMAL LOW (ref 22–32)
CO2: 21 mmol/L — ABNORMAL LOW (ref 22–32)
CO2: 25 mmol/L (ref 22–32)
CO2: 27 mmol/L (ref 22–32)
CO2: 28 mmol/L (ref 22–32)
Calcium: 8.9 mg/dL (ref 8.9–10.3)
Calcium: 9.1 mg/dL (ref 8.9–10.3)
Calcium: 8.1 mg/dL — ABNORMAL LOW (ref 8.9–10.3)
Calcium: 8.1 mg/dL — ABNORMAL LOW (ref 8.9–10.3)
Calcium: 8.2 mg/dL — ABNORMAL LOW (ref 8.9–10.3)
Chloride: 85 mmol/L — ABNORMAL LOW (ref 98–111)
Chloride: 91 mmol/L — ABNORMAL LOW (ref 98–111)
Chloride: 96 mmol/L — ABNORMAL LOW (ref 98–111)
Chloride: 97 mmol/L — ABNORMAL LOW (ref 98–111)
Chloride: 96 mmol/L — ABNORMAL LOW (ref 98–111)
Creatinine, Ser: 13.17 mg/dL — ABNORMAL HIGH (ref 0.61–1.24)
Creatinine, Ser: 7.01 mg/dL — ABNORMAL HIGH (ref 0.61–1.24)
Creatinine, Ser: 6.38 mg/dL — ABNORMAL HIGH (ref 0.61–1.24)
Creatinine, Ser: 6.88 mg/dL — ABNORMAL HIGH (ref 0.61–1.24)
Creatinine, Ser: 7.36 mg/dL — ABNORMAL HIGH (ref 0.61–1.24)
GFR calc Af Amer: 4 mL/min — ABNORMAL LOW (ref >60)
GFR calc Af Amer: 10 mL/min — ABNORMAL LOW (ref >60)
GFR calc Af Amer: 11 mL/min — ABNORMAL LOW (ref >60)
GFR calc Af Amer: 10 mL/min — ABNORMAL LOW (ref >60)
GFR calc Af Amer: 9 mL/min — ABNORMAL LOW (ref >60)
GFR calc non Af Amer: 4 mL/min — ABNORMAL LOW (ref >60)
GFR calc non Af Amer: 8 mL/min — ABNORMAL LOW (ref >60)
GFR calc non Af Amer: 9 mL/min — ABNORMAL LOW (ref >60)
GFR calc non Af Amer: 8 mL/min — ABNORMAL LOW (ref >60)
GFR calc non Af Amer: 8 mL/min — ABNORMAL LOW (ref >60)
Glucose, Bld: 1035 mg/dL (ref 70–99)
Glucose, Bld: 561 mg/dL (ref 70–99)
Glucose, Bld: 318 mg/dL — ABNORMAL HIGH (ref 70–99)
Glucose, Bld: 225 mg/dL — ABNORMAL HIGH (ref 70–99)
Glucose, Bld: 173 mg/dL — ABNORMAL HIGH (ref 70–99)
Potassium: 7.2 mmol/L (ref 3.5–5.1)
Potassium: 3.9 mmol/L (ref 3.5–5.1)
Potassium: 3.4 mmol/L — ABNORMAL LOW (ref 3.5–5.1)
Potassium: 3.3 mmol/L — ABNORMAL LOW (ref 3.5–5.1)
Potassium: 3.4 mmol/L — ABNORMAL LOW (ref 3.5–5.1)
Sodium: 125 mmol/L — ABNORMAL LOW (ref 135–145)
Sodium: 134 mmol/L — ABNORMAL LOW (ref 135–145)
Sodium: 136 mmol/L (ref 135–145)
Sodium: 137 mmol/L (ref 135–145)
Sodium: 139 mmol/L (ref 135–145)

## 2019-06-28 LAB — CBC
HCT: 34.4 % — ABNORMAL LOW (ref 39.0–52.0)
Hemoglobin: 11.8 g/dL — ABNORMAL LOW (ref 13.0–17.0)
MCH: 31.9 pg (ref 26.0–34.0)
MCHC: 34.3 g/dL (ref 30.0–36.0)
MCV: 93 fL (ref 80.0–100.0)
Platelets: 280 10*3/uL (ref 150–400)
RBC: 3.7 MIL/uL — ABNORMAL LOW (ref 4.22–5.81)
RDW: 13.9 % (ref 11.5–15.5)
WBC: 17.4 10*3/uL — ABNORMAL HIGH (ref 4.0–10.5)
nRBC: 0 % (ref 0.0–0.2)

## 2019-06-28 LAB — POCT I-STAT 7, (LYTES, BLD GAS, ICA,H+H)
Acid-Base Excess: 3 mmol/L — ABNORMAL HIGH (ref 0.0–2.0)
Acid-Base Excess: 5 mmol/L — ABNORMAL HIGH (ref 0.0–2.0)
Acid-Base Excess: 8 mmol/L — ABNORMAL HIGH (ref 0.0–2.0)
Acid-Base Excess: 10 mmol/L — ABNORMAL HIGH (ref 0.0–2.0)
Acid-Base Excess: 7 mmol/L — ABNORMAL HIGH (ref 0.0–2.0)
Bicarbonate: 25.6 mmol/L (ref 20.0–28.0)
Bicarbonate: 26.9 mmol/L (ref 20.0–28.0)
Bicarbonate: 31.2 mmol/L — ABNORMAL HIGH (ref 20.0–28.0)
Bicarbonate: 33.3 mmol/L — ABNORMAL HIGH (ref 20.0–28.0)
Bicarbonate: 32.1 mmol/L — ABNORMAL HIGH (ref 20.0–28.0)
Calcium, Ion: 1.09 mmol/L — ABNORMAL LOW (ref 1.15–1.40)
Calcium, Ion: 0.98 mmol/L — ABNORMAL LOW (ref 1.15–1.40)
Calcium, Ion: 1.06 mmol/L — ABNORMAL LOW (ref 1.15–1.40)
Calcium, Ion: 1.05 mmol/L — ABNORMAL LOW (ref 1.15–1.40)
Calcium, Ion: 1.09 mmol/L — ABNORMAL LOW (ref 1.15–1.40)
HCT: 40 % (ref 39.0–52.0)
HCT: 32 % — ABNORMAL LOW (ref 39.0–52.0)
HCT: 32 % — ABNORMAL LOW (ref 39.0–52.0)
HCT: 32 % — ABNORMAL LOW (ref 39.0–52.0)
HCT: 31 % — ABNORMAL LOW (ref 39.0–52.0)
Hemoglobin: 13.6 g/dL (ref 13.0–17.0)
Hemoglobin: 10.9 g/dL — ABNORMAL LOW (ref 13.0–17.0)
Hemoglobin: 10.9 g/dL — ABNORMAL LOW (ref 13.0–17.0)
Hemoglobin: 10.9 g/dL — ABNORMAL LOW (ref 13.0–17.0)
Hemoglobin: 10.5 g/dL — ABNORMAL LOW (ref 13.0–17.0)
O2 Saturation: 100 %
O2 Saturation: 100 %
O2 Saturation: 100 %
O2 Saturation: 100 %
O2 Saturation: 99 %
Patient temperature: 36.1
Patient temperature: 36.2
Patient temperature: 36
Patient temperature: 36
Patient temperature: 36
Potassium: 3.7 mmol/L (ref 3.5–5.1)
Potassium: 3.2 mmol/L — ABNORMAL LOW (ref 3.5–5.1)
Potassium: 3.2 mmol/L — ABNORMAL LOW (ref 3.5–5.1)
Potassium: 3.3 mmol/L — ABNORMAL LOW (ref 3.5–5.1)
Potassium: 3.2 mmol/L — ABNORMAL LOW (ref 3.5–5.1)
Sodium: 131 mmol/L — ABNORMAL LOW (ref 135–145)
Sodium: 136 mmol/L (ref 135–145)
Sodium: 137 mmol/L (ref 135–145)
Sodium: 138 mmol/L (ref 135–145)
Sodium: 138 mmol/L (ref 135–145)
TCO2: 27 mmol/L (ref 22–32)
TCO2: 28 mmol/L (ref 22–32)
TCO2: 32 mmol/L (ref 22–32)
TCO2: 34 mmol/L — ABNORMAL HIGH (ref 22–32)
TCO2: 34 mmol/L — ABNORMAL HIGH (ref 22–32)
pCO2 arterial: 30.5 mmHg — ABNORMAL LOW (ref 32.0–48.0)
pCO2 arterial: 28.8 mmHg — ABNORMAL LOW (ref 32.0–48.0)
pCO2 arterial: 34.7 mmHg (ref 32.0–48.0)
pCO2 arterial: 36.4 mmHg (ref 32.0–48.0)
pCO2 arterial: 44.8 mmHg (ref 32.0–48.0)
pH, Arterial: 7.528 — ABNORMAL HIGH (ref 7.350–7.450)
pH, Arterial: 7.576 — ABNORMAL HIGH (ref 7.350–7.450)
pH, Arterial: 7.558 — ABNORMAL HIGH (ref 7.350–7.450)
pH, Arterial: 7.567 — ABNORMAL HIGH (ref 7.350–7.450)
pH, Arterial: 7.459 — ABNORMAL HIGH (ref 7.350–7.450)
pO2, Arterial: 199 mmHg — ABNORMAL HIGH (ref 83.0–108.0)
pO2, Arterial: 216 mmHg — ABNORMAL HIGH (ref 83.0–108.0)
pO2, Arterial: 204 mmHg — ABNORMAL HIGH (ref 83.0–108.0)
pO2, Arterial: 155 mmHg — ABNORMAL HIGH (ref 83.0–108.0)
pO2, Arterial: 140 mmHg — ABNORMAL HIGH (ref 83.0–108.0)

## 2019-06-28 LAB — RENAL FUNCTION PANEL
Albumin: 2.8 g/dL — ABNORMAL LOW (ref 3.5–5.0)
Anion gap: 25 — ABNORMAL HIGH (ref 5–15)
BUN: 70 mg/dL — ABNORMAL HIGH (ref 6–20)
CO2: 15 mmol/L — ABNORMAL LOW (ref 22–32)
Calcium: 8.3 mg/dL — ABNORMAL LOW (ref 8.9–10.3)
Chloride: 81 mmol/L — ABNORMAL LOW (ref 98–111)
Creatinine, Ser: 13.06 mg/dL — ABNORMAL HIGH (ref 0.61–1.24)
GFR calc Af Amer: 5 mL/min — ABNORMAL LOW (ref >60)
GFR calc non Af Amer: 4 mL/min — ABNORMAL LOW (ref >60)
Glucose, Bld: 1150 mg/dL (ref 70–99)
Phosphorus: 5.1 mg/dL — ABNORMAL HIGH (ref 2.5–4.6)
Potassium: 6.8 mmol/L (ref 3.5–5.1)
Sodium: 121 mmol/L — ABNORMAL LOW (ref 135–145)

## 2019-06-28 LAB — TROPONIN I (HIGH SENSITIVITY)
Troponin I (High Sensitivity): 1146 ng/L (ref <18)
Troponin I (High Sensitivity): 2765 ng/L (ref <18)
Troponin I (High Sensitivity): 3592 ng/L (ref <18)

## 2019-06-28 LAB — POCT I-STAT EG7
Acid-base deficit: 18 mmol/L — ABNORMAL HIGH (ref 0.0–2.0)
Bicarbonate: 13.5 mmol/L — ABNORMAL LOW (ref 20.0–28.0)
Calcium, Ion: 1.04 mmol/L — ABNORMAL LOW (ref 1.15–1.40)
HCT: 41 % (ref 39.0–52.0)
Hemoglobin: 13.9 g/dL (ref 13.0–17.0)
O2 Saturation: 72 %
Potassium: 7.8 mmol/L (ref 3.5–5.1)
Sodium: 120 mmol/L — ABNORMAL LOW (ref 135–145)
TCO2: 15 mmol/L — ABNORMAL LOW (ref 22–32)
pCO2, Ven: 53.4 mmHg (ref 44.0–60.0)
pH, Ven: 7.01 — CL (ref 7.250–7.430)
pO2, Ven: 57 mmHg — ABNORMAL HIGH (ref 32.0–45.0)

## 2019-06-28 LAB — MAGNESIUM: Magnesium: 2.4 mg/dL (ref 1.7–2.4)

## 2019-06-28 LAB — MRSA PCR SCREENING: MRSA by PCR: NEGATIVE

## 2019-06-28 LAB — PROTIME-INR
INR: 1.2 (ref 0.8–1.2)
Prothrombin Time: 14.7 seconds (ref 11.4–15.2)

## 2019-06-28 LAB — ECHOCARDIOGRAM COMPLETE
Height: 75 in
Weight: 3802.49 oz

## 2019-06-28 LAB — APTT: aPTT: 30 seconds (ref 24–36)

## 2019-06-28 LAB — LACTIC ACID, PLASMA: Lactic Acid, Venous: 8.8 mmol/L (ref 0.5–1.9)

## 2019-06-28 LAB — BETA-HYDROXYBUTYRIC ACID: Beta-Hydroxybutyric Acid: 1.9 mmol/L — ABNORMAL HIGH (ref 0.05–0.27)

## 2019-06-28 LAB — HIV ANTIBODY (ROUTINE TESTING W REFLEX): HIV Screen 4th Generation wRfx: NONREACTIVE

## 2019-06-28 MED ORDER — DEXTROSE 50 % IV SOLN: 0.0000 mL | INTRAVENOUS | Status: DC | PRN

## 2019-06-28 MED ORDER — ALBUMIN HUMAN 5 % IV SOLN
INTRAVENOUS | Status: AC
  Administered 2019-06-28: 12.5 g via INTRAVENOUS
  Filled 2019-06-28: qty 250

## 2019-06-28 MED ORDER — DEXTROSE-NACL 5-0.45 % IV SOLN: INTRAVENOUS | Status: DC

## 2019-06-28 MED ORDER — CHLORHEXIDINE GLUCONATE 0.12% ORAL RINSE (MEDLINE KIT)
15.0000 mL | Freq: Two times a day (BID) | OROMUCOSAL | Status: DC
  Administered 2019-06-28 – 2019-06-29 (×3): 15 mL via OROMUCOSAL

## 2019-06-28 MED ORDER — ALBUMIN HUMAN 5 % IV SOLN: 12.5000 g | Freq: Once | INTRAVENOUS | Status: AC

## 2019-06-28 MED ORDER — PANTOPRAZOLE SODIUM 40 MG PO PACK
40.0000 mg | PACK | Freq: Every day | ORAL | Status: DC
  Administered 2019-06-28: 40 mg
  Filled 2019-06-28 (×2): qty 20

## 2019-06-28 MED ORDER — ORAL CARE MOUTH RINSE
15.0000 mL | OROMUCOSAL | Status: DC
  Administered 2019-06-28 – 2019-06-29 (×15): 15 mL via OROMUCOSAL

## 2019-06-28 MED ORDER — SODIUM CHLORIDE 0.9% FLUSH: 10.0000 mL | INTRAVENOUS | Status: DC | PRN

## 2019-06-28 MED ORDER — SODIUM CHLORIDE 0.9 % IV SOLN
INTRAVENOUS | Status: DC | PRN
  Administered 2019-06-28: 10 mL via INTRAVENOUS

## 2019-06-28 MED ORDER — SODIUM CHLORIDE 0.9 % IV SOLN: INTRAVENOUS | Status: DC

## 2019-06-28 MED ORDER — INSULIN (MYXREDLIN) INFUSION FOR HYPERTRIGLYCERIDEMIA: 0.1000 [IU]/kg/h | INTRAVENOUS | Status: DC

## 2019-06-28 MED ORDER — INSULIN REGULAR(HUMAN) IN NACL 100-0.9 UT/100ML-% IV SOLN: INTRAVENOUS | Status: DC

## 2019-06-28 MED ORDER — TICAGRELOR 90 MG PO TABS
90.0000 mg | ORAL_TABLET | Freq: Two times a day (BID) | ORAL | Status: DC
  Administered 2019-06-28 – 2019-06-30 (×4): 90 mg
  Filled 2019-06-28 (×5): qty 1

## 2019-06-28 MED ORDER — SODIUM CHLORIDE 0.9% FLUSH
10.0000 mL | Freq: Two times a day (BID) | INTRAVENOUS | Status: DC
  Administered 2019-06-28 – 2019-06-29 (×3): 10 mL

## 2019-06-28 MED ORDER — STERILE WATER FOR INJECTION IV SOLN
INTRAVENOUS | Status: AC
  Filled 2019-06-28: qty 850

## 2019-06-28 MED ORDER — ASPIRIN 81 MG PO CHEW
81.0000 mg | CHEWABLE_TABLET | Freq: Every day | ORAL | Status: DC
  Administered 2019-06-28 – 2019-06-30 (×3): 81 mg
  Filled 2019-06-28 (×3): qty 1

## 2019-06-28 NOTE — Progress Notes (Signed)
Chaplain responded to Code for CPR effort in ED whose family was in [place].  Attended physician as he gave his report on patient status.  Offered family water and promised to follow up. At time of follow up patient was being transferred to ICU.  Escorted family to [facility] waiting area.  Brought family water and crackers and a warm blanket.   De Burrs Chaplain Resident

## 2019-06-28 NOTE — Progress Notes (Addendum)
NAME:  Lance [name], MRN:  [id number], DOB:  1970/04/13, LOS: 1 ADMISSION DATE:  06/26/2019, CONSULTATION DATE:  06/14/2019 REFERRING MD:  EDP, CHIEF COMPLAINT:  OOH Cardiac Arrest   Brief History   Lance [name] is a 49 year old male with a past medical history of ESRD on home dialysis, Type 1 DM, HFpEF, HL, CAD followed by [facility], DES x2 to the RCA in 05-14-2019, PAD s/p R BKA who was in his usual state of health prior to today.  His wife states that he woke up this morning feeling nauseated with vomiting and diarrhea.  His wife witnessed him try to stand and then sent back into his wheelchair and become unresponsive. EMS arrived 5 minutes later and found patient in V. fib arrest, it he underwent approximately 30 minutes of CPR and ROSC was obtained.    In the ED, EKG with prolonged QTC and K of 8, glucose >700 with HCO3 7, anion gap 30. He was given IV insulin, fluids, bicarb, Ca gluconate and admitted to [facility]. CT head noncon showed no acute intracranial process, loss of gray-white differentiation c/f anoxic brain injury.   Of note, patient had a similar event in 13-May-2017 where he coded in the emergency department after presenting in DKA with hyperkalemia. Past Medical History  ESRD on dialysis  T1DM HFpEF  CAD s/p DESx2 to RCA in 05-14-2019 PAD s/p R [facility] Events   5/17: OOH Cardiac arrest   Consults:  Nephrology  Procedures:  Dialysis 5/17  Significant Diagnostic Tests:  CT Head noncon 5/17: Diffuse bilateral sulcal edema and loss of gray-white matter differentiation concerning for global anoxic injury. Negative for herniation at this time.  Micro Data:  -  Antimicrobials:  -   Interim history/subjective:  NAE since arrival. Pt intubated and unresponsive. Pt cooled to 36C. VSS, BP 120s/80s on Levo 8/min. Underwent emergency HD this am, had one bout of hypotension responsive to albumin infusion otherwise tolerated well. ABG improved to 7.53/30.5/199/25.6 from  7.1/36/57/13 on presentation. Anion gap now 20, sugar 500s, K to 3.9 s/p dialysis. Brain stem reflexes absent as below. Wife and mother at bedside.   Objective   Blood pressure 132/71, pulse 93, temperature (!) 97 F (36.1 C), temperature source Esophageal, resp. rate (!) 27, height 6\' 3"  (1.905 m), weight 107.8 kg, SpO2 100 %. CVP:  [0 mmHg-6 mmHg] 2 mmHg  Vent Mode: PRVC FiO2 (%):  [50 %-100 %] 50 % Set Rate:  [20 bmp-28 bmp] 20 bmp Vt Set:  [620 mL] 620 mL PEEP:  [5 cmH20] 5 cmH20 Plateau Pressure:  [18 cmH20-19 cmH20] 18 cmH20   Intake/Output Summary (Last 24 hours) at 06/28/2019 0938 Last data filed at 06/28/2019 0900 Gross per 24 hour  Intake 2874.85 ml  Output 0 ml  Net 2874.85 ml   Filed Weights   06/29/2019 14-May-2110 06/28/19 0000 06/28/19 0825  Weight: 103.3 kg 107.9 kg 107.8 kg    Examination: Gen: In NAD. Intubated in bed.  HEENT: atraumatic, normocephalic, pupils nonreactive, MMM, ETT in place  Heart: RRR, S1, S2, no M/R/G Lungs: mechanical breath sounds  Abdomen: NTND, soft Extremities: no clubbing, cyanosis, or edema Neuro: Does not withdraw to pain. Unresponsive. Pupils NR, L>R. Corneal, cough, gag reflexes absent.  Skin:  Bandage on L leg from IO insertion. No rashes, lesions GCS 2T.   Resolved Hospital Problem list   none  Assessment & Plan:  49 yo man with h/o ESRD on dialysis, CAD  s/p DES to RCAx2, HFpEF, T1DM, PAD s/p R BKA presented with OOH cardiac arrest, hyperkalemia, and DKA.   Witnessed out of hospital V. fib cardiac arrest: Pt had similar episode in 2019; this episode likely related to hyperkalemia (K of 8 on presentation) and DKA. Approximately 30 minutes downtime before ROSC. Brain stem reflexes are absent and pt does not breathe over the ventilator. CT head showed significant edema and c/f anoxic brain injury. He does not fit criteria for brain death at this point given metabolic derangements and cooling, but prognosis is poor. We had a lengthy  discussion with wife and mother today regarding his arrest and prognosis. We agreed that we will make him DNR, initiate warming tonight, and reevaluate reflexes tomorrow while metabolic insults continue to improve.   - Continue TTM to 36 degrees - Continue Levophed to maintain MAP>65 -TTE pending  -EEG pending   - goals of care discussion ongoing, prognosis poor  - no need for cardiology consult for now given neuro status; will reevaluate   Hypoxic respiratory failure: 2/2 cardiac arrest. Pt doing well on current vent settings however had no respiratory effort when switched to PS today. Brain stem reflexes absent and does not breathe over vent. ABG improved to 7.53/30.5/199/25.6 from 7.1/36/57/13 on presentation. RR decreased to 20 this AM  -Maintain full vent support with SAT/SBT as tolerated -titrate Vent setting to maintain SpO2 greater than or equal to 90%. -HOB elevated 30 degrees. - Repeat ABG pending  -Bronchial hygiene and RT/bronchodilator protocol.  Type 1 DM with DKA: Pt presented with HCO3 7, anion gap 30, BG >700, K 8 in DKA. He was started on insulin gtt and fluids. Anion gap now 20, HCO3 21, pH via ABG 7.5, K 3.9 s/p HD. His DKA is improving but of course this is complicated by his ESRD.  - Continue insulin gtt and fluids per endotool; likely will stay even with return to BG 200-250 given overall picture  - repeat ABG pending  - monitor K ; BMP q4h  ESRD on home dialysis / hyperkalemia: Pt presented in DKA with K of 8 and underwent emergency HD on 5/17 AM  via his right brachiocephalic fistula , tolerated well. K likely driver of V fib arrest and most recently 3.9. - Nephrology consulted, appreciate recs  - Monitor K   CAD s/p DES x2 to RCA / HFpEF / hypertension / hyperlipidemia / prolonged QT: Initial EKG with Qtc 516 and anterolateral repol changes. Pt got full dose ASA in ED, and taking home ASA + Brilinta. Troponin continues to rise, difficult to interpret given arrest  and CPR but likely demand related. No need to consult cardiology for now given neuro status; if he shows improvement we will reevaluate.  - repeat EKG pending - Continue home ASA, Brilinta  Best practice:  Diet: NPO Pain/Anxiety/Delirium protocol (if indicated): none VAP protocol (if indicated): Per protocol DVT prophylaxis: SQH GI prophylaxis: Protonix Glucose control: Insulin drip Mobility: BR Code Status: DNR Family Communication: Bedside, 5/17 Disposition: ICU   Ivin Poot, MS4   Pulmonary critical care attending:  This is a 49 year old gentleman with a past medical history of end-stage renal disease on dialysis, diabetes, uncontrolled, heart failure with preserved ejection fraction, hyperlipidemia, CAD, history of drug-eluting stent placement, history of peripheral arterial disease status post right BKA.  Patient had a witnessed cardiac arrest at home.  EMS responded within approximately 5 to 10 minutes.  Patient had CPR initiated with approximately 30-minute downtime.  Was  found to be in ventricular fibrillation.  Patient was brought here after obtaining ROSC.  Decision was made for code cool protocol.  Patient had CT imaging of the head concerning for anoxic brain injury with diffuse swelling and loss of sulci.  BP (!) 104/46   Pulse 89   Temp (!) 97 F (36.1 C) (Esophageal)   Resp 15   Ht 6\' 3"  (1.905 m)   Wt 107.8 kg   SpO2 100%   BMI 29.70 kg/m   General: Male intubated on mechanical life support unresponsive to stimuli. Heart: Regular rate rhythm S1-S2 Lungs: Bilateral mechanically ventilated breath sounds HEENT: Pupils unresponsive to light, loss of corneal reflexes No cough with suctioning Abdomen: Nondistended cooling pads in place Extremities: No response to pain Neuro: Off all sedation, loss of neurologic reflexes at this time however temperature is below you thermia.  Assessment: Cardiac arrest, ventricular fibrillation status post ROSC Acute  hypoxemic respiratory failure secondary to above Prolonged downtime Acute metabolic encephalopathy, likely anoxic brain injury Cerebral edema on CT Remains on targeted temperature management protocol right now at 36 degrees. DKA in the setting of type 1 diabetes hyperglycemia secondary Hyperkalemia in the setting of acidosis and end-stage renal disease need for urgent dialysis last night.  Plan: IHD continued for management of volume and hyperkalemia. Continue targeted temperature management protocol. Discussed current neurologic findings with the patient's family at bedside. Overall prognosis I believe is poor. He is not breathing over the ventilator, loss of neuro reflexes however does have multiple metabolic derangements. I cannot say for certain that he is progressed to brain death at this time. I think the next best step is to allow completion of targeted temperature management protocol Work to correct electrolyte and metabolic derangements. We also discussed the potential difficulty with this in the setting of ESRD and diabetes.  Family has agreed to change CODE STATUS to DNR. This has been changed.  This patient is critically ill with multiple organ system failure; which, requires frequent high complexity decision making, assessment, support, evaluation, and titration of therapies. This was completed through the application of advanced monitoring technologies and extensive interpretation of multiple databases. During this encounter critical care time was devoted to patient care services described in this note for 32 minutes.   Garner Nash, DO [facility] 06/28/2019 10:26 AM

## 2019-06-28 NOTE — Progress Notes (Signed)
eLink Physician-Brief Progress Note Patient Name: Lance [name] DOB: 1971/02/06 MRN: [id number]   Date of Service  06/28/2019  HPI/Events of Note  16 M ESRD on home HD via right arm AVF, DM, HFpEF, CAD s/p stent, PAD s/p right BKA presented after a witnessed arrest. Reportedly has been having nausea, vomiting and weakness prior. Vfib on EMS arrival with ROSC after 30 min. K 8, glucose > 700. CT head negative.  eICU Interventions   Hyperkalemia as likely etiology of cardiac arrest with wife reporting that this has happened before  High AG metabolic acidosis from lactic acidosis, renal failure and likely ketoacidosis.  Will check serum ketones. Fluid and insulin drip ongoing.  TTM     Intervention Category Major Interventions: Arrhythmia - evaluation and management;Electrolyte abnormality - evaluation and management;Respiratory failure - evaluation and management Evaluation Type: New Patient Evaluation  Judd Lien 06/28/2019, 12:50 AM

## 2019-06-28 NOTE — Progress Notes (Signed)
Pt admitted from ED via hospital stretcher after report was received. Pt transferred to hospital and attached to unit monitors. CHG and MRSA swab completed. TTM pads applied and commenced to cool for 36 degrees per protocol. Pt tempature initially 35.7 degrees. Will continue to assess and monitor.

## 2019-06-28 NOTE — Procedures (Signed)
Arterial Catheter Insertion Procedure Note TARAY NORMOYLE [id number] 1970/03/17  Procedure: Insertion of Arterial Catheter  Indications: Blood pressure monitoring  Procedure Details Consent: Unable to obtain consent because of emergent medical necessity. Time Out: Verified patient identification, verified procedure, site/side was marked, verified correct patient position, special equipment/implants available, medications/allergies/relevent history reviewed, required imaging and test results available.  Performed  Maximum sterile technique was used including antiseptics, cap, gloves, hand hygiene, mask and sheet. Skin prep: Chlorhexidine; local anesthetic administered 20 gauge catheter was inserted into left radial artery using the Seldinger technique. ULTRASOUND GUIDANCE USED: YES  Successful on second attempt Evaluation Blood flow good; BP tracing good. Complications: No apparent complications.   Collier Bullock 06/28/2019

## 2019-06-28 NOTE — Progress Notes (Signed)
MD Icard notified of ABG results and BMET results with closing anion gap.  MD ordered ventilator changes, respiratory rate 12 and tidal volume of 550.  RT notified and ventilator changes made by RT.  Follow up ABG to be completed in an hour.  MD advised to change BMET order to every 12 hours.

## 2019-06-28 NOTE — Progress Notes (Signed)
Initial Nutrition Assessment  RD working remotely.  DOCUMENTATION CODES:   Obesity unspecified  INTERVENTION:   Tube feeding recommendations: - Vital 1.5 @ 40 ml/hr (960 ml/day) via OG tube - Pro-stat 60 ml QID  Recommended tube feeding regimen provides 2240 kcal, 185 grams of protein, and 733 ml of H2O (meets 100% of needs).  NUTRITION DIAGNOSIS:   Inadequate oral intake related to inability to eat as evidenced by NPO status.  GOAL:   Patient will meet greater than or equal to 90% of their needs  MONITOR:   Vent status, Labs, Weight trends, I & O's  REASON FOR ASSESSMENT:   Ventilator    ASSESSMENT:   49 year old male who presented on 5/16 after a witnessed cardiac arrest likely secondary to hyperkalemia and DKA. ROSC obtained after 30 minutes. Pt intubated in the field. PMH of T1DM, HTN, CHF, CAD, PAD/diabetic foot infection s/p right BKA, SVT, ESRD on home HD. TTM 36 degrees initiated.   Per CCM note, length discussion had with wife and mother today regarding poor prognosis. Plan is to initiate rewarming tonight and reevaluate reflexes tomorrow.  Pt had emergent HD yesterday with 0 ml net UF. Post-HD weight was 107.8 kg. EDW: 102.5 kg  OG tube in place with tip overlying proximal stomach per x-ray. Currently to low intermittent suction.  Discussed pt with CCM. Plan is to hold off on tube feeding at this time and reevaluate in the morning. If pt improves in the AM, plan to start TF then. RD will leave recommendations.  Patient is currently intubated on ventilator support MV: 9.3 L/min Temp (24hrs), Avg:96.9 F (36.1 C), Min:96.3 F (35.7 C), Max:98.2 F (36.8 C) BP (a-line): 125/53 MAP (a-line): 74  Drips: Levophed: 7.5 ml/hr Insulin: 2.6 ml/hr NS: 75 ml/hr  Medications reviewed and include: protonix  Labs reviewed: potassium 3.2, ionized calcium 1.06 CBG's: 273-517 x 24 hours (trending down)  OG tube output: 150 ml  NUTRITION - FOCUSED PHYSICAL  EXAM:  Unable to complete at this time. RD working remotely.  Diet Order:   Diet Order            Diet NPO time specified  Diet effective now              EDUCATION NEEDS:   No education needs have been identified at this time  Skin:  Skin Assessment: Reviewed RN Assessment  Last BM:  no documented BM  Height:   Ht Readings from Last 1 Encounters:  06/28/19 6\' 3"  (1.905 m)    Weight:   Wt Readings from Last 1 Encounters:  06/28/19 107.8 kg    Ideal Body Weight:  83.3 kg (adjusted for right BKA)  BMI:  Body mass index is 29.7 kg/m.   Adjusted BMI: 31.77 kg/m  Estimated Nutritional Needs:   Kcal:  2201  Protein:  165-185 grams  Fluid:  1000 ml + UOP    Gaynell Face, MS, RD, LDN Inpatient Clinical Dietitian Pager: [phone] Weekend/After Hours: [phone]

## 2019-06-28 NOTE — Progress Notes (Signed)
eLink Physician-Brief Progress Note Patient Name: Lance [name] DOB: 1970-04-16 MRN: [id number]   Date of Service  06/28/2019  HPI/Events of Note  Notified of hypotension 2 hours into dialysis. Insulin drip adjusted which should help bring down K as well.  eICU Interventions  Ordered albumin 5% to facilitate dialysis     Intervention Category Major Interventions: Hypotension - evaluation and management  Judd Lien 06/28/2019, 6:15 AM

## 2019-06-28 NOTE — Progress Notes (Signed)
CRITICAL VALUE ALERT  Critical Value: K+ 7.5/7.2; Gluc 1063/ 1035; Trop 511/ 1146  Date & Time Notied:  0110  Provider Notified: Dr Loren Racer  Orders Received/Actions taken: No new orders at this time

## 2019-06-28 NOTE — Progress Notes (Signed)
Inpatient Diabetes Program Recommendations  AACE/ADA: New Consensus Statement on Inpatient Glycemic Control (2015)  Target Ranges:  Prepandial:   less than 140 mg/dL      Peak postprandial:   less than 180 mg/dL (1-2 hours)      Critically ill patients:  140 - 180 mg/dL   Lab Results  Component Value Date   GLUCAP 197 (H) 06/28/2019   HGBA1C 10.1 (A) 03/05/2019    Review of Glycemic Control Results for Lance [name], Lance [name] (MRN [id number]) as of 06/28/2019 16:26  Ref. Range 06/28/2019 12:35 06/28/2019 14:10 06/28/2019 15:17 06/28/2019 16:21  Glucose-Capillary Latest Ref Range: 70 - 99 mg/dL 249 (H) 217 (H) 213 (H) 197 (H)   Diabetes history: DM 2 Outpatient Diabetes medications: Lantus 30 units q HS, Humalog 14-17 units tid with meals Current orders for Inpatient glycemic control:  IV insulin/DKA order set Inpatient Diabetes Program Recommendations:    Recommend continuation of IV insulin at this time.   Thanks  Adah Perl, RN, BC-ADM Inpatient Diabetes Coordinator Pager [phone] (8a-5p)

## 2019-06-28 NOTE — Progress Notes (Signed)
vLTM EEG started after spot EEG same leads. Notified Neuro

## 2019-06-28 NOTE — Progress Notes (Signed)
[facility] Progress Note  Subjective: not responding  Vitals:   06/28/19 1345 06/28/19 1400 06/28/19 1415 06/28/19 1430  BP:  125/73    Pulse: 83 82 82 82  Resp: '16 16 16 16  ' Temp:  (!) 96.8 F (36 C)    TempSrc:  Esophageal    SpO2: 100% 100% 100% 100%  Weight:      Height:        Exam:  intubated , not responsive  chest cta bilat  cor reg no mrg  abd soft ntnd no ascites  ext no edema, R BKA    R BC aVF+bruit   Neuro unresponsive    Dialysis: HHD NxStage 2k/45 lactate, 60L vol, 64% full fraction. 450 bfr. 102.5kg  R BC AVF  15ga Hep 3000    - calc 2 ug qd  - velphoro 1gm tid ac/ phoslo 2 ac tid   Assessment/ Plan: 1.  Status post ventricular fibrillation arrest: 25 min of CPR before ROSC.  2.  Hyperkalemia: sp emergent hemodialysis last night. K+ wnl today 3.  End-stage renal disease: On home hemodialysis 4 days a week. Had HD x 4h last night in ICU, came off at 7 am.  No need for HD today. Will plan next HD prob for Wed.  4.  Hypotension: on very low dose levo gtt today 5.  Hyperglycemia: prob DKA, AG 30 > 13 today w/ IV insulin and HD 6.  Coronary artery disease status post PTCA: Restarted back on aspirin and Brilinta 7.  Secondary hyperparathyroidism: Intubated at this time and currently n.p.o.  Hold off on binders/vitamin D receptor analog at this time     [name] 06/28/2019, 2:56 PM   Recent Labs  Lab 06/28/19 0419 06/28/19 0515 06/28/19 0552 06/28/19 0932 06/28/19 1035 06/28/19 1136 06/28/19 1411  K 6.8*   < > 3.9   < > 3.4* 3.2* 3.3*  BUN 70*   < > 35*  --  22*  --   --   CREATININE 13.06*   < > 7.01*  --  6.38*  --   --   CALCIUM 8.3*   < > 9.1  --  8.1*  --   --   PHOS 5.1*  --   --   --   --   --   --   HGB  --    < >  --    < >  --  10.9* 10.9*   < > = values in this interval not displayed.   Inpatient medications: . aspirin  81 mg Per Tube Daily  . chlorhexidine gluconate (MEDLINE KIT)  15 mL Mouth Rinse BID  .  Chlorhexidine Gluconate Cloth  6 each Topical Q0600  . heparin  5,000 Units Subcutaneous Q8H  . mouth rinse  15 mL Mouth Rinse 10 times per day  . pantoprazole sodium  40 mg Per Tube QHS  . sodium chloride flush  10-40 mL Intracatheter Q12H  . ticagrelor  90 mg Per Tube BID   . sodium chloride    . sodium chloride    . sodium chloride 10 mL/hr at 06/28/19 1000  . sodium chloride 75 mL/hr at 06/28/19 1200  . dextrose 5 % and 0.45% NaCl 75 mL/hr at 06/28/19 1400  . insulin 1.5 mL/hr at 06/28/19 1400  . norepinephrine (LEVOPHED) Adult infusion 2 mcg/min (06/28/19 1400)   sodium chloride, sodium chloride, sodium chloride, dextrose, dextrose, docusate sodium, heparin, heparin, polyethylene glycol, sodium chloride  flush

## 2019-06-28 NOTE — Procedures (Signed)
Patient Name: DIANTE BARLEY  MRN: [id number]  Epilepsy Attending: Lora Havens  Referring Physician/Provider: Elease Etienne, NP Date: 06/28/2019 Duration: 23.27 minutes  Patient history: 49 year old male status post cardiac arrest on TTM.  EEG to evaluate for seizures.  Level of alertness: Comatose  AEDs during EEG study: None  Technical aspects: This EEG study was done with scalp electrodes positioned according to the 10-20 International system of electrode placement. Electrical activity was acquired at a sampling rate of 500Hz  and reviewed with a high frequency filter of 70Hz  and a low frequency filter of 1Hz . EEG data were recorded continuously and digitally stored.   Description: EEG showed continuous generalized background suppression.  EEG was not reactive to tactile stimuli.  Hyperventilation and photic stimulation were not performed.     ABNORMALITY -Background suppression, generalized  IMPRESSION: This study is suggestive of profound diffuse encephalopathy, nonspecific to etiology but most likely related to diffuse anoxic/hypoxic brain injury.  No seizures or epileptiform discharges were seen throughout the recording.  Cyleigh Massaro Barbra Sarks

## 2019-06-28 NOTE — Progress Notes (Signed)
  Echocardiogram 2D Echocardiogram has been performed.  Lance [name] G Kyandre Okray 06/28/2019, 2:25 PM

## 2019-06-28 NOTE — Progress Notes (Signed)
  Echocardiogram 2D Echocardiogram has been attempted. Patient with family. Nurse asked to return in an hour.  Lance [name] G Nili Honda 06/28/2019, 11:26 AM

## 2019-06-28 NOTE — Procedures (Signed)
Central Venous Catheter Insertion Procedure Note Lance [name] [id number] [birth year]  Procedure: Insertion of Central Venous Catheter Indications: Assessment of intravascular volume, Drug and/or fluid administration and Frequent blood sampling  Procedure Details Consent: Risks of procedure as well as the alternatives and risks of each were explained to the (patient/caregiver).  Consent for procedure obtained. Time Out: Verified patient identification, verified procedure, site/side was marked, verified correct patient position, special equipment/implants available, medications/allergies/relevent history reviewed, required imaging and test results available.  Performed  Maximum sterile technique was used including antiseptics, cap, gloves, gown, hand hygiene, mask and sheet. Skin prep: Chlorhexidine; local anesthetic administered A antimicrobial bonded/coated triple lumen catheter was placed in the right internal jugular vein using the Seldinger technique and verified with [place].  Evaluation Blood flow good Complications: No apparent complications Patient did tolerate procedure well. Chest X-ray ordered to verify placement.  CXR: pending.  Lance [name] Lance [name] 06/28/2019, 12:29 AM

## 2019-06-28 NOTE — Progress Notes (Signed)
EEG completed, results pending. 

## 2019-06-28 NOTE — Progress Notes (Signed)
CRITICAL VALUE ALERT  Critical Value:  Troponin 2,765  Date & Time Notied:  06/28/2019  At 0800  Provider Notified: NP Moshe Cipro  Orders Received/Actions taken: No new orders at this time.  Follow Up Troponin pending.

## 2019-06-28 NOTE — H&P (Signed)
NAME:  Lance [name], MRN:  [id number], DOB:  [birth year], LOS: 1 ADMISSION DATE:  06/22/2019, CONSULTATION DATE:  06/28/19 REFERRING MD:  EDP, CHIEF COMPLAINT:  Cardiac Arrest   Brief History   49 year old male with history of type 1 diabetes CAD and ESRD who woke up nauseated with vomiting and diarrhea.  He felt weak all day and was not able to eat or drink.  His wife witnessed him try to stand and then sent back into his wheelchair and become unresponsive.  She is not sure if he was breathing or had a pulse.  EMS arrived 5 minutes later and found patient in V. fib arrest, it he underwent approximately 30 minutes of CPR and ROSC was obtained.  PCCM consulted for admission  History of present illness   Lance [name] is a 49 year old male with a past medical history of ESRD on home dialysis, Type 1 DM, HFpEF, HL, CAD followed by [facility], DES x2 to the RCA in 05/20/2019, PAD s/p R BKA who was in his usual state of health prior to today.  His wife states that he woke up this morning feeling nauseated with vomiting and diarrhea.  He did not have a fever or complained of any abdominal pain, no known ill contacts or complaints of chest pain or shortness of breath. His wife witnessed him try to stand and then sent back into his wheelchair and become unresponsive.  She is not sure if he was breathing or had a pulse.  EMS arrived 5 minutes later and found patient in V. fib arrest, it he underwent approximately 30 minutes of CPR and ROSC was obtained.  He received amiodarone, calcium and bicarb prior to arrival  In the ED, EKG with prolonged QTC and potassium of 8, glucose greater than 700 with anion gap H of 7.0.  He was given IV insulin, fluids and bicarb and PCCM consulted for admission.  Chest x-ray with vascular congestion, CT head with out acute bleed or signs of CVA, concerning for anoxic injury.  Of note, patient had a similar event in 05/19/17 where he coded in the emergency department after presenting in  DKA with hyperkalemia.  Past Medical History   has a past medical history of Aortic stenosis, Arthritis, CHF (congestive heart failure) ([place]), Degenerative joint disease, Diastolic heart failure ([place]), ESRD (end stage renal disease) on dialysis [facility]), Essential hypertension, benign 05/20/98), GERD (gastroesophageal reflux disease), HCAP (healthcare-associated pneumonia) (11/28/2015), Hyperlipidemia 1998/05/20), Loculated pleural effusion (11/28/2015), Pneumonia (12/2013; 05/2014), PSVT (paroxysmal supraventricular tachycardia) ([place]), Tobacco abuse, in remission, and Type 1 diabetes mellitus ([place]) (1990).   Significant Hospital Events   06/28/19 admit to PCCM   Consults:  Nephrology  Procedures:  5/16 ETT 5/17 CVC and Aline  Significant Diagnostic Tests:  5/17 CT head>>Diffuse bilateral sulcal edema and loss of gray-white matter differentiation concerning for global anoxic injury. Negative for herniation at this time.  Micro Data:  5/16 MRSA screen>> negative 5/16 SARS-Cov-2>>negative  Antimicrobials:    Interim history/subjective:  Patient was initially started on Levophed for hypotension, this improved with IV fluids and was titrated down  Objective   Blood pressure (!) 106/38, pulse 82, temperature (!) 96.7 F (35.9 C), temperature source Temporal, resp. rate (!) 41, weight 103.3 kg, SpO2 99 %.    Vent Mode: PRVC FiO2 (%):  [50 %-100 %] 50 % Set Rate:  [20 bmp-28 bmp] 28 bmp Vt Set:  [284 mL] 620 mL PEEP:  [5 cmH20] 5 cmH20  Intake/Output Summary (Last 24 hours) at 06/28/2019 0035 Last data filed at 06/17/2019 2257 Gross per 24 hour  Intake 1000 ml  Output 0 ml  Net 1000 ml   Filed Weights   07/09/2019 2112  Weight: 103.3 kg    General: Well-nourished male intubated and sedated HEENT: MM pink/moist ET tube in place  neuro: Unresponsive to pain, no corneal reflexes, pupils 3 mm bilaterally and minimally responsive to light, breathing over the ventilator CV: s1s2 RRR, no  m/r/g PULM: CTA B GI: soft, bsx4 active  Extremities: warm/dry, no edema  Skin: no rashes or lesions  Resolved Hospital Problem list     Assessment & Plan:    Witnessed out of hospital V. fib cardiac arrest likely secondary to hyperkalemia and DKA -S/p ROSC after proximately 5 minutes downtime with no CPR and 30 minutes ACLS Received calcium bicarb and IV insulin in the ED P: -Initiate TTM to 36 degrees -Continue bicarb drip patient to get dialysis overnight -Continue Levophed to maintain MAP>65 -Echocardiogram -EEG -Poor neurologic exam without any sedating medications and CT concerning for anoxic brain injury, discussed with family, they would like to continue full code for now  Hypoxic respiratory failure -Secondary to cardiac arrest P: -Maintain full vent support with SAT/SBT as tolerated -titrate Vent setting to maintain SpO2 greater than or equal to 90%. -HOB elevated 30 degrees. -Plateau pressures less than 30 cm H20.  -Follow chest x-ray, ABG prn.   -Bronchial hygiene and RT/bronchodilator protocol.    Type 1 diabetes with DKA -Per wife, patient's glucose has recently been somewhat out of control P: -Start insulin drip per Endo tool, every 4 hours BMPs  ESRD -patient has not missed any home dialysis sessions P: -Seen by nephrology, blood pressure improving so will be able to start HD overnight    CAD s/p DES x2 to RCA, HFpEF, hypertension, hyperlipidemia -initial EKG with Qtc 516 and anterolateral repol changes, repeat EKG -Full dose aspirin in the ED, continue ASA 81 mg at home Brilinta -Troponin 500 continue to trend, repeat EKG pending   Diarrhea nausea and vomiting -no reported abdominal pain, fever or leukocytosis, suspect secondary to DKA P: -culture stools if diarrhea persists or patient develops fever/signs of infection  Best practice:  Diet: N.p.o. Pain/Anxiety/Delirium protocol (if indicated): Fentanyl VAP protocol (if indicated): HOB 30  degrees daily SBT DVT prophylaxis: Heparin GI prophylaxis: Protonix Glucose control: Insulin drip Mobility: Bedrest Code Status: Full code Family Communication: Updated patient's wife and multiple family members in the emergency department Disposition: ICU  Labs   CBC: Recent Labs  Lab 07/07/2019 2107 07/07/2019 2123 07/01/2019 2139  WBC  --   --  10.3  HGB 13.9  13.9 11.9* 11.1*  HCT 41.0  41.0 35.0* 35.6*  MCV  --   --  104.4*  PLT  --   --  720    Basic Metabolic Panel: Recent Labs  Lab 06/14/2019 2107 06/29/2019 2123 06/29/2019 2139  NA 120*  120* 121* 125*  K 7.8*  8.4* 7.0* >7.5*  CL 91*  --  83*  CO2  --   --  12*  GLUCOSE >700*  --  1,063*  BUN 93*  --  65*  CREATININE 13.20*  --  12.99*  CALCIUM  --   --  8.4*   GFR: Estimated Creatinine Clearance: 8.9 mL/min (A) (by C-G formula based on SCr of 12.99 mg/dL (H)). Recent Labs  Lab 06/24/2019 2122 06/29/2019 2139  WBC  --  10.3  LATICACIDVEN >11.0*  --     Liver Function Tests: No results for input(s): AST, ALT, ALKPHOS, BILITOT, PROT, ALBUMIN in the last 168 hours. No results for input(s): LIPASE, AMYLASE in the last 168 hours. No results for input(s): AMMONIA in the last 168 hours.  ABG    Component Value Date/Time   PHART 7.174 (LL) 06/23/2019 2123   PCO2ART 36.3 06/24/2019 2123   PO2ART 409 (H) 07/10/2019 2123   HCO3 13.6 (L) 06/12/2019 2123   TCO2 15 (L) 06/18/2019 2123   ACIDBASEDEF 14.0 (H) 06/25/2019 2123   O2SAT 100.0 06/12/2019 2123     Coagulation Profile: Recent Labs  Lab 06/15/2019 2139 06/15/2019 2206  INR 1.1 1.2    Cardiac Enzymes: No results for input(s): CKTOTAL, CKMB, CKMBINDEX, TROPONINI in the last 168 hours.  HbA1C: Hemoglobin A1C  Date/Time Value Ref Range Status  03/05/2019 09:31 AM 10.1 (A) 4.0 - 5.6 % Final   Hgb A1c MFr Bld  Date/Time Value Ref Range Status  10/14/2018 01:46 PM 8.6 (H) <5.7 % of total Hgb Final    Comment:    For someone without known diabetes, a  hemoglobin A1c value of 6.5% or greater indicates that they may have  diabetes and this should be confirmed with a follow-up  test. . For someone with known diabetes, a value <7% indicates  that their diabetes is well controlled and a value  greater than or equal to 7% indicates suboptimal  control. A1c targets should be individualized based on  duration of diabetes, age, comorbid conditions, and  other considerations. . Currently, no consensus exists regarding use of hemoglobin A1c for diagnosis of diabetes for children. Marland Kitchen   06/03/2018 11:28 AM 8.2 (H) 4.8 - 5.6 % Final    Comment:             Prediabetes: 5.7 - 6.4          Diabetes: >6.4          Glycemic control for adults with diabetes: <7.0     CBG: No results for input(s): GLUCAP in the last 168 hours.  Review of Systems:   Unable to obtain secondary to intubation  Past Medical History  He,  has a past medical history of Aortic stenosis, Arthritis, CHF (congestive heart failure) ([place]), Degenerative joint disease, Diastolic heart failure ([place]), ESRD (end stage renal disease) on dialysis [facility]), Essential hypertension, benign (2000), GERD (gastroesophageal reflux disease), HCAP (healthcare-associated pneumonia) (11/28/2015), Hyperlipidemia (2000), Loculated pleural effusion (11/28/2015), Pneumonia (12/2013; 05/2014), PSVT (paroxysmal supraventricular tachycardia) ([place]), Tobacco abuse, in remission, and Type 1 diabetes mellitus ([place]) (1990).   Surgical History    Past Surgical History:  Procedure Laterality Date  . AMPUTATION Right 06/06/2017   Procedure: RIGHT BELOW KNEE AMPUTATION;  Surgeon: [name], MD;  Location: [place];  Service: Orthopedics;  Laterality: Right;  . APPLICATION OF WOUND VAC Right 07/25/2017   Procedure: APPLICATION OF WOUND VAC;  Surgeon: [name], MD;  Location: [place];  Service: Orthopedics;  Laterality: Right;  . AV FISTULA PLACEMENT Right 06/23/2014   Procedure: ARTERIOVENOUS (AV) FISTULA  CREATION;  Surgeon: Angelia Mould, MD;  Location: [place];  Service: Vascular;  Laterality: Right;  . CARDIAC CATHETERIZATION     [facility]- evaluation for Kidney/ pancreas transplant  . EYE SURGERY Bilateral    "for glaucoma"  . INGUINAL HERNIA REPAIR Right    As a child  . IR DIALY SHUNT INTRO NEEDLE/INTRACATH INITIAL W/IMG RIGHT Right 09/29/2017  .  LOWER EXTREMITY ANGIOGRAPHY N/A 05/27/2017   Procedure: LOWER EXTREMITY ANGIOGRAPHY;  Surgeon: [name], MD;  Location: [place] CV LAB;  Service: Cardiovascular;  Laterality: N/A;  . STUMP REVISION Right 07/25/2017   Procedure: REVISION RIGHT BELOW KNEE AMPUTATION;;  Surgeon: [name], MD;  Location: [place];  Service: Orthopedics;  Laterality: Right;  . [place]     Social History   reports that he has never smoked. He has never used smokeless tobacco. He reports previous alcohol use. He reports that he does not use drugs.   Family History   His family history includes Diabetes in his father and mother; Hypertension in his father and mother.   Allergies Allergies  Allergen Reactions  . Atorvastatin Other (See Comments)    Muscle pain   . Minoxidil Other (See Comments)    Fluid retention   . Adhesive [Tape] Other (See Comments)    MUST USE "SILK TAPE," AS ANY OTHER "BURNS" THE SKIN  . Statins Other (See Comments)    Muscle pain     Home Medications  Prior to Admission medications   Medication Sig Start Date End Date Taking? Authorizing Provider  amLODipine (NORVASC) 5 MG tablet Take 5 mg by mouth daily.  08/11/18   [provider]  aspirin EC 81 MG tablet Take 81 mg by mouth daily.    [provider]  calcitRIOL (ROCALTROL) 0.25 MCG capsule Take 1 capsule (0.25 mcg total) by mouth every Monday, Wednesday, and Friday with hemodialysis. Patient taking differently: Take 0.5 mcg by mouth See admin instructions. Take 0.25 mcg by mouth once a day on Mon/Wed/Fri with home dialysis  06/18/17   Angiulli, Lavon Paganini, PA-C  Continuous Blood Gluc Receiver (FREESTYLE LIBRE 2 READER SYSTM) DEVI 1 Piece by Does not apply route as directed. 10/26/18   Cassandria Anger, MD  Continuous Blood Gluc Sensor (FREESTYLE LIBRE 14 DAY SENSOR) MISC Inject 1 each into the skin every 14 (fourteen) days. Use as directed. 10/26/18   Cassandria Anger, MD  doxycycline (VIBRAMYCIN) 100 MG capsule Take 1 capsule (100 mg total) by mouth 2 (two) times daily. 12/01/18   Noemi Chapel, MD  glucose blood (ACCU-CHEK GUIDE) test strip Use as instructed 4 x daily. E11.65 02/19/18   Cassandria Anger, MD  Insulin Glargine (LANTUS SOLOSTAR) 100 UNIT/ML Solostar Pen Inject 30 Units into the skin at bedtime. 03/05/19   Cassandria Anger, MD  insulin lispro (HUMALOG) 100 UNIT/ML KwikPen Inject 0.14-0.17 mLs (14-17 Units total) into the skin 3 (three) times daily before meals. 03/05/19   Cassandria Anger, MD  metoprolol succinate (TOPROL-XL) 25 MG 24 hr tablet Take 25 mg by mouth every evening.  08/11/18   [provider]  nitroGLYCERIN (NITROSTAT) 0.4 MG SL tablet Place 0.4 mg under the tongue every 5 (five) minutes as needed for chest pain.  08/20/18 08/20/19  [provider]  pantoprazole (PROTONIX) 40 MG tablet TAKE 1 TABLET BY MOUTH EVERY DAY Patient taking differently: Take 40 mg by mouth daily.  09/08/17   [name], MD  pravastatin (PRAVACHOL) 10 MG tablet Take 10 mg by mouth every evening.  09/14/18   [provider]  sucroferric oxyhydroxide (VELPHORO) 500 MG chewable tablet Chew 3 tablets (1,500 mg total) by mouth 3 (three) times daily. 06/18/17   Angiulli, Lavon Paganini, PA-C  ticagrelor (BRILINTA) 90 MG TABS tablet Take 90 mg by mouth 2 (two) times daily.  08/20/18 08/20/19  [provider]     Critical care time: 65 minutes      CRITICAL CARE Performed by: [name] Kitt Minardi   Total critical care time: 65 minutes  Critical care time was exclusive of separately  billable procedures and treating other patients.  Critical care was necessary to treat or prevent imminent or life-threatening deterioration.  Critical care was time spent personally by me on the following activities: development of treatment plan with patient and/or surrogate as well as nursing, discussions with consultants, evaluation of patient's response to treatment, examination of patient, obtaining history from patient or surrogate, ordering and performing treatments and interventions, ordering and review of laboratory studies, ordering and review of radiographic studies, pulse oximetry and re-evaluation of patient's condition.  [name] Melysa Schroyer, PA-C

## 2019-06-29 ENCOUNTER — Inpatient Hospital Stay (HOSPITAL_COMMUNITY): Payer: Medicare Other

## 2019-06-29 DIAGNOSIS — G931 Anoxic brain damage, not elsewhere classified: Secondary | ICD-10-CM

## 2019-06-29 LAB — CBC
HCT: 30.5 % — ABNORMAL LOW (ref 39.0–52.0)
Hemoglobin: 10.3 g/dL — ABNORMAL LOW (ref 13.0–17.0)
MCH: 33.1 pg (ref 26.0–34.0)
MCHC: 33.8 g/dL (ref 30.0–36.0)
MCV: 98.1 fL (ref 80.0–100.0)
Platelets: 195 10*3/uL (ref 150–400)
RBC: 3.11 MIL/uL — ABNORMAL LOW (ref 4.22–5.81)
RDW: 14.7 % (ref 11.5–15.5)
WBC: 13.2 10*3/uL — ABNORMAL HIGH (ref 4.0–10.5)
nRBC: 0 % (ref 0.0–0.2)

## 2019-06-29 LAB — MAGNESIUM: Magnesium: 1.8 mg/dL (ref 1.7–2.4)

## 2019-06-29 LAB — GLUCOSE, CAPILLARY
Glucose-Capillary: 148 mg/dL — ABNORMAL HIGH (ref 70–99)
Glucose-Capillary: 161 mg/dL — ABNORMAL HIGH (ref 70–99)
Glucose-Capillary: 167 mg/dL — ABNORMAL HIGH (ref 70–99)
Glucose-Capillary: 174 mg/dL — ABNORMAL HIGH (ref 70–99)
Glucose-Capillary: 179 mg/dL — ABNORMAL HIGH (ref 70–99)
Glucose-Capillary: 181 mg/dL — ABNORMAL HIGH (ref 70–99)
Glucose-Capillary: 183 mg/dL — ABNORMAL HIGH (ref 70–99)
Glucose-Capillary: 189 mg/dL — ABNORMAL HIGH (ref 70–99)
Glucose-Capillary: 194 mg/dL — ABNORMAL HIGH (ref 70–99)
Glucose-Capillary: 201 mg/dL — ABNORMAL HIGH (ref 70–99)
Glucose-Capillary: 212 mg/dL — ABNORMAL HIGH (ref 70–99)
Glucose-Capillary: 222 mg/dL — ABNORMAL HIGH (ref 70–99)
Glucose-Capillary: 234 mg/dL — ABNORMAL HIGH (ref 70–99)
Glucose-Capillary: 244 mg/dL — ABNORMAL HIGH (ref 70–99)
Glucose-Capillary: 257 mg/dL — ABNORMAL HIGH (ref 70–99)
Glucose-Capillary: 266 mg/dL — ABNORMAL HIGH (ref 70–99)
Glucose-Capillary: 278 mg/dL — ABNORMAL HIGH (ref 70–99)
Glucose-Capillary: 280 mg/dL — ABNORMAL HIGH (ref 70–99)
Glucose-Capillary: 280 mg/dL — ABNORMAL HIGH (ref 70–99)

## 2019-06-29 LAB — BASIC METABOLIC PANEL
Anion gap: 12 (ref 5–15)
BUN: 25 mg/dL — ABNORMAL HIGH (ref 6–20)
CO2: 28 mmol/L (ref 22–32)
Calcium: 8.1 mg/dL — ABNORMAL LOW (ref 8.9–10.3)
Chloride: 98 mmol/L (ref 98–111)
Creatinine, Ser: 8.6 mg/dL — ABNORMAL HIGH (ref 0.61–1.24)
GFR calc Af Amer: 8 mL/min — ABNORMAL LOW (ref >60)
GFR calc non Af Amer: 6 mL/min — ABNORMAL LOW (ref >60)
Glucose, Bld: 208 mg/dL — ABNORMAL HIGH (ref 70–99)
Potassium: 3.4 mmol/L — ABNORMAL LOW (ref 3.5–5.1)
Sodium: 138 mmol/L (ref 135–145)

## 2019-06-29 MED ORDER — CHLORHEXIDINE GLUCONATE CLOTH 2 % EX PADS: 6.0000 | MEDICATED_PAD | Freq: Every day | CUTANEOUS | Status: DC

## 2019-06-29 MED ORDER — SODIUM CHLORIDE 0.9% FLUSH
10.0000 mL | Freq: Two times a day (BID) | INTRAVENOUS | Status: DC
  Administered 2019-06-29 – 2019-06-30 (×2): 10 mL

## 2019-06-29 MED ORDER — DEXTROSE 50 % IV SOLN: 0.0000 mL | INTRAVENOUS | Status: DC | PRN

## 2019-06-29 MED ORDER — SODIUM CHLORIDE 0.9 % IV SOLN: 100.0000 mL | INTRAVENOUS | Status: DC | PRN

## 2019-06-29 MED ORDER — INSULIN REGULAR(HUMAN) IN NACL 100-0.9 UT/100ML-% IV SOLN
INTRAVENOUS | Status: DC
  Administered 2019-06-29: 2.8 [IU]/h via INTRAVENOUS
  Filled 2019-06-29: qty 100

## 2019-06-29 MED ORDER — INSULIN ASPART 100 UNIT/ML ~~LOC~~ SOLN
1.0000 [IU] | SUBCUTANEOUS | Status: DC
  Administered 2019-06-29: 1 [IU] via SUBCUTANEOUS
  Administered 2019-06-29: 2 [IU] via SUBCUTANEOUS
  Administered 2019-06-30 (×3): 3 [IU] via SUBCUTANEOUS

## 2019-06-29 MED ORDER — INSULIN DETEMIR 100 UNIT/ML ~~LOC~~ SOLN
10.0000 [IU] | Freq: Two times a day (BID) | SUBCUTANEOUS | Status: DC
  Administered 2019-06-29 – 2019-06-30 (×2): 10 [IU] via SUBCUTANEOUS
  Filled 2019-06-29 (×4): qty 0.1

## 2019-06-29 MED ORDER — SODIUM CHLORIDE 0.9 % IV SOLN: INTRAVENOUS | Status: DC

## 2019-06-29 NOTE — Progress Notes (Signed)
LTM EEG discontinued - no skin breakdown at unhook.   

## 2019-06-29 NOTE — Progress Notes (Signed)
[facility] Progress Note  Subjective: not responding  Vitals:   06/29/19 0800 06/29/19 1000 06/29/19 1124 06/29/19 1130  BP: 120/65  (!) 102/47   Pulse: 93  93   Resp: 12  12   Temp: 98.8 F (37.1 C) 98.6 F (37 C)  98.6 F (37 C)  TempSrc: Esophageal Esophageal  Core  SpO2: 99%  98%   Weight:      Height:        Exam:  intubated , not responsive  chest cta bilat  cor reg no mrg  abd soft ntnd no ascites  ext no edema, R BKA    R BC aVF+bruit   Neuro unresponsive    Dialysis: HHD NxStage 2k/45 lactate, 60L vol, 64% full fraction. 450 bfr. 102.5kg  R BC AVF  15ga Hep 3000    - calc 2 ug qd  - velphoro 1gm tid ac/ phoslo 2 ac tid   Assessment/ Plan: 1.  Status post ventricular fibrillation arrest: 25 min of CPR before ROSC. Possible ABI.  2.  Hyperkalemia: sp emergent HD early Monday am 3.  End-stage renal disease: On home hemodialysis 4 days a week. Plan next HD tomorrow 2nd shift assuming still full medical rx.    4.  Hypotension: on low dose gtt 5.  Hyperglycemia/ DKA: AG 30 > 13 w/ IV insulin and HD. Resolved.  6.  Coronary artery disease status post PTCA: Restarted back on aspirin and Brilinta 7.  Secondary hyperparathyroidism: Intubated at this time and currently n.p.o.  Hold off on binders/vitamin D receptor analog at this time      [name] 06/29/2019, 12:20 PM   Recent Labs  Lab 06/28/19 0419 06/28/19 0515 06/28/19 1405 06/28/19 1622 06/28/19 1716 06/29/19 0350  K 6.8*   < >   < > 3.2* 3.4* 3.4*  BUN 70*   < >   < >  --  24* 25*  CREATININE 13.06*   < >   < >  --  7.36* 8.60*  CALCIUM 8.3*   < >   < >  --  8.2* 8.1*  PHOS 5.1*  --   --   --   --   --   HGB  --    < >  --  10.5*  --  10.3*   < > = values in this interval not displayed.   Inpatient medications: . aspirin  81 mg Per Tube Daily  . chlorhexidine gluconate (MEDLINE KIT)  15 mL Mouth Rinse BID  . Chlorhexidine Gluconate Cloth  6 each Topical Q0600  . heparin   5,000 Units Subcutaneous Q8H  . mouth rinse  15 mL Mouth Rinse 10 times per day  . pantoprazole sodium  40 mg Per Tube QHS  . sodium chloride flush  10-40 mL Intracatheter Q12H  . ticagrelor  90 mg Per Tube BID   . sodium chloride    . sodium chloride    . sodium chloride 10 mL/hr at 06/28/19 1000  . sodium chloride 75 mL/hr at 06/28/19 1200  . dextrose 5 % and 0.45% NaCl 75 mL/hr at 06/29/19 1017  . insulin 1.3 mL/hr at 06/29/19 0800  . norepinephrine (LEVOPHED) Adult infusion 3 mcg/min (06/29/19 0800)   sodium chloride, sodium chloride, sodium chloride, dextrose, dextrose, docusate sodium, heparin, heparin, polyethylene glycol, sodium chloride flush

## 2019-06-29 NOTE — Procedures (Addendum)
Patient Name: ADRIANA LINA  MRN: [id number]  Epilepsy Attending: Lora Havens  Referring Physician/Provider: Elease Etienne, NP Duration: 06/28/2019 1039 to 06/29/2019 1020  Patient history: 49 year old male status post cardiac arrest on TTM.  EEG to evaluate for seizures.  Level of alertness: Comatose  AEDs during EEG study: None  Technical aspects: This EEG study was done with scalp electrodes positioned according to the 10-20 International system of electrode placement. Electrical activity was acquired at a sampling rate of 500Hz  and reviewed with a high frequency filter of 70Hz  and a low frequency filter of 1Hz . EEG data were recorded continuously and digitally stored.   Description: EEG showed continuous generalized background suppression.  EEG was not reactive to tactile stimuli.  Hyperventilation and photic stimulation were not performed.     ABNORMALITY -Background suppression, generalized  IMPRESSION: This study is suggestive of profound diffuse encephalopathy, nonspecific to etiology but most likely related to diffuse anoxic/hypoxic brain injury.  No seizures or epileptiform discharges were seen throughout the recording.  Aijalon Kirtz Barbra Sarks

## 2019-06-29 NOTE — Progress Notes (Signed)
   MRI results reviewed.  Appreciate Neurology input and discussing results with wife.  Met with wife at bedside.  Discussed transition to comfort care and what that would look like.   There are more family/ friends coming in from out of town tonight to visit.  When family has had time to visit, will transition to full comfort care and one way extubation, most likely will be 5/19.  I expect patient to pass very quickly based on his neuro exam/ imaging which were relayed to wife.   In the interim, will continue with current level of care.  No escalation, remains a DNR if he were to unexpectedly decompensate overnight.       Kennieth Rad, MSN, AGACNP-BC [facility] 06/29/2019, 4:58 PM  See Amion for personal pager PCCM on call pager [phone]

## 2019-06-29 NOTE — Progress Notes (Addendum)
NAME:  Lance [name], MRN:  [id number], DOB:  02-08-1971, LOS: 2 ADMISSION DATE:  06/12/2019, CONSULTATION DATE:  07/10/2019 REFERRING MD:  EDP, CHIEF COMPLAINT:  OOH Cardiac Arrest   Brief History   [name] is a 49 year old male with a past medical history of ESRD on home dialysis, Type 1 DM, HFpEF, HL, CAD followed by [facility], DES x2 to the RCA in 05/21/2019, PAD s/p R BKA who was in his usual state of health prior to today.  His wife states that he woke up this morning feeling nauseated with vomiting and diarrhea.  His wife witnessed him try to stand and then sent back into his wheelchair and become unresponsive. EMS arrived 5 minutes later and found patient in V. fib arrest, it he underwent approximately 30 minutes of CPR and ROSC was obtained.    In the ED, EKG with prolonged QTC and K of 8, glucose >700 with HCO3 7, anion gap 30. He was given IV insulin, fluids, bicarb, Ca gluconate and admitted to [facility]. CT head noncon showed no acute intracranial process, loss of gray-white differentiation c/f anoxic brain injury.   Of note, patient had a similar event in 05/20/2017 where he coded in the emergency department after presenting in DKA with hyperkalemia. Past Medical History  ESRD on dialysis  T1DM HFpEF  CAD s/p DESx2 to RCA in 2019-05-21 PAD s/p R [place] [facility] Events   5/17: OOH Cardiac arrest   Consults:  Nephrology  Procedures:  Dialysis 5/17  Significant Diagnostic Tests:  CT Head noncon 5/17: Diffuse bilateral sulcal edema and loss of gray-white matter differentiation concerning for global anoxic injury. Negative for herniation at this time.   Micro Data:  -  Antimicrobials:  -   Interim history/subjective:  NAE. ABG showed respiratory alkalosis yesterday, rate decreased and repeat ABG showed resolution. Blood sugar now 180s-200s. VSS on 12mcg Levophed and vent settings below. Anion gap now 12, pH 7.46, K3.4. EEG showed diffuse encephalopathy. TTE showed LVEF  40-45%.   Objective   Blood pressure (!) 102/49, pulse 94, temperature 98.8 F (37.1 C), temperature source Esophageal, resp. rate 12, height 6\' 3"  (1.905 m), weight 108.2 kg, SpO2 100 %. CVP:  [0 mmHg-7 mmHg] 7 mmHg  Vent Mode: PRVC FiO2 (%):  [40 %-50 %] 40 % Set Rate:  [12 bmp-16 bmp] 12 bmp Vt Set:  [550 mL-600 mL] 550 mL PEEP:  [5 cmH20] 5 cmH20 Plateau Pressure:  [16 cmH20-21 cmH20] 19 cmH20   Intake/Output Summary (Last 24 hours) at 06/29/2019 0841 Last data filed at 06/29/2019 0700 Gross per 24 hour  Intake 1856.26 ml  Output 200 ml  Net 1656.26 ml   Filed Weights   06/28/19 0000 06/28/19 0825 06/29/19 0500  Weight: 107.9 kg 107.8 kg 108.2 kg    Examination: Gen: In NAD. Intubated in bed.  HEENT: atraumatic, normocephalic, pupils nonreactive, MMM, ETT in place  Heart: RRR, S1, S2, no M/R/G Lungs: mechanical breath sounds  Abdomen: NTND, soft Extremities: no clubbing, cyanosis, or edema Neuro: Does not withdraw to pain. Unresponsive. Pupils NR, L>R. Corneal, cough, gag reflexes absent. Doll's head negative.  Skin:  Bandage on L leg from IO insertion. No rashes, lesions GCS 2T.   Resolved Hospital Problem list   none  Assessment & Plan:  49 yo man with h/o ESRD on dialysis, CAD s/p DES to RCAx2, HFpEF, T1DM, PAD s/p R BKA presented with OOH cardiac arrest, hyperkalemia, and DKA.  Witnessed out of hospital V. fib cardiac arrest / acute metabolic encephalopathy Pt had similar episode in 2019; this episode likely related to hyperkalemia (K of 8 on presentation) and DKA. Approximately 30 minutes downtime before ROSC. CT head showed significant edema and c/f anoxic brain injury. EEG showed profound diffuse encephalopathy, no seizures/epileptiform discharges. He does not fit criteria for brain death at this point given cooling; anion gap now 12 but in setting of ESRD will likely not be <12 and DKA is improved. Prognosis remains poor. We had a lengthy discussion with wife and  mother at bedside again today regarding his arrest and prognosis. Pt is DNR. Brain stem reflexes are absent and pt does not breathe over the ventilator; we will give him 24 hours off cooling before apnea test.  - Continue Levophed to maintain MAP>65 - goals of care discussion ongoing, prognosis poor  - no need for cardiology consult for now given neuro status - Reevaluate/apnea test after normothermic for 24 hours, 5/19 am - Consult neurology, appreciate recs  - repeat brain imaging with MRI   Hypoxic respiratory failure: 2/2 cardiac arrest. Pt doing well on current vent settings however had no respiratory effort when switched to PS again today. Brain stem reflexes absent and does not breathe over vent. Most recent ABG 7.46/44.8/140/32.  -Maintain full vent support  -HOB elevated 30 degrees. -Bronchial hygiene and RT/bronchodilator protocol.  Type 1 DM with DKA: Pt presented with HCO3 7, anion gap 30, BG >700, K 8 in DKA. He was started on insulin gtt and fluids. Anion gap now 12, HCO3 32, pH via ABG 7.46, K 3.4 s/p HD on 5/17. Blood sugars now 180-200s.  - discontinue insulin gtt  - SSI, SQ  - monitor K ; BMP q12h  ESRD on home dialysis / hyperkalemia: Pt presented in DKA with K of 8 and underwent emergency HD on 5/17 AM  via his right brachiocephalic fistula , tolerated well. K likely driver of V fib arrest and most recently 3.9. - Nephrology consulted, appreciate recs ; plan for HD 5/19 if improved - Monitor K   CAD s/p DES x2 to RCA / HFpEF / hypertension / hyperlipidemia / prolonged QT: Initial EKG with Qtc 516 and anterolateral repol changes. Pt got full dose ASA in ED, and taking home ASA + Brilinta. Troponin continues to rise, difficult to interpret given arrest and CPR but likely demand related. No need to consult cardiology for now given neuro status; if he shows improvement we will reevaluate.  - repeat EKG pending - Continue home ASA, Brilinta  FEN:  - NPO, on D5 at 48mL/hr  and has D50 prn. No tube feeds yet   Best practice:  Diet: NPO Pain/Anxiety/Delirium protocol (if indicated): none VAP protocol (if indicated): Per protocol DVT prophylaxis: SQH GI prophylaxis: Protonix Glucose control: Insulin drip Mobility: BR Code Status: DNR Family Communication: Bedside, 5/17 Disposition: ICU   Ivin Poot, MS4   PCCM Attending:   49 yo M, s/p cardiac arrest, CT head with anoxic injury changes on admit, poor neuro exam, now warmed to euthermia as of this AM. EEG with anoxic injury. Flat waves per my view this AM. Neuro exam unchanged. Remains on levo in shock.   BP 120/65 (BP Location: Left Arm)   Pulse 93   Temp 98.8 F (37.1 C) (Esophageal)   Resp 12   Ht 6\' 3"  (1.905 m)   Wt 108.2 kg   SpO2 99%   BMI 29.82 kg/m  Gen: debilitated male, on vent HEENT: left pupil larger than right, no reactive  Neck: ETT in place  Heart: RRR, S1 s2  Lungs: CTAB, on vent  Ext: Left BKA  Neuro: no cough, no gag, no corneals, not breathing over vent   Labs reviewed  A: Cardiac Arrest Likely metabolically mediated  DKA Severe Acidosis  Likely Anoxic brain injury  Poor prognosis, I have high suspicion for progression to brain death   P: Neuro consult  MRI  Pressors for map >65  HD per nephro Updated family  EEG per neuro Thanks for the help with the case   This patient is critically ill with multiple organ system failure; which, requires frequent high complexity decision making, assessment, support, evaluation, and titration of therapies. This was completed through the application of advanced monitoring technologies and extensive interpretation of multiple databases. During this encounter critical care time was devoted to patient care services described in this note for 32 minutes.   Garner Nash, DO [facility] 06/29/2019 9:06 AM

## 2019-06-29 NOTE — Plan of Care (Signed)
  Problem: Clinical Measurements: Goal: Diagnostic test results will improve Outcome: Not Progressing   Problem: Clinical Measurements: Goal: Respiratory complications will improve Outcome: Not Progressing   Problem: Clinical Measurements: Goal: Cardiovascular complication will be avoided Outcome: Progressing

## 2019-06-29 NOTE — Progress Notes (Signed)
MRI completed and reviewed.  Suggestive of global anoxic injury and brain herniation. Met with the family-wife, father, mother, sister, and aunt and uncle in the conference room on 2 heart. Discussed the imaging findings. Family unanimously very clear that the patient, would not want to be on prolonged support if chances for meaningful recovery are poor.  Given his initial exam, CT findings on arrival, continuing poor neurological exam with loss of brainstem reflexes, and MRI findings suggestive of global anoxic brain injury and brain herniation, I discussed with them that the chances of neurologically meaningful recovery are extremely grim. The family is leaning towards comfort measures only and compassionate extubation. Patient's RN was also at the meeting and will relay the plan to the primary critical care team. Further management and comfort measures initiation per primary team. The family has expressed that there are certain of the family members including his children who would like to come see him before compassionate extubation-and I assured them that we will make sure that that is respected. Inpatient neurology service will be available as needed.  Please call with questions.   -- Amie Portland, MD [facility] Pager: [phone] If 7pm to 7am, please call on call as listed on AMION.

## 2019-06-29 NOTE — Consult Note (Addendum)
Neurology Consultation  Reason for Consult: Prognostication Referring Physician: Dr. June Leap  CC: Prognostication after 30 minutes of CPR  History is obtained from: Chart  HPI: Lance [name] is a 49 y.o. male with past medical history diabetes, tobacco abuse, PSVT, hyperlipidemia, essential hypertension, diastolic heart failure, CHF, ESRD on home dialysis and aortic stenosis.  On 06/28/2019 patient woke up nauseated and was having vomiting/diarrhea.  He had felt weak all day along with not able to eat or drink.  Wife witnessed him trying to stand and placed him back into the wheelchair where he became unresponsive.  At that time she was not sure if he was breathing and or had a pulse.  EMS arrived 5 minutes later and found the patient in V. fib arrest.  Patient underwent 30 minutes of CPR prior to [place].  Cooling protocol started 8 AM on 06/28/2019 and patient was rewarmed at 2 AM on 06/29/2019.  At this time CT scan reveals diffuse bilateral sulcal I edema and loss of gray-white matter differential concerning for global anoxic injury.  Negative for herniation at that time.  Labs of importance: -BUN 70 -Creatinine 13.6 -WBC 13.2 -Potassium 6.8 Glucose 1150  Neurology was consulted today for prognostication.  Past Medical History:  Diagnosis Date  . Aortic stenosis    Very mild in 05/2007  . Arthritis    "right shoulder; right knee; feel like I've got it all over" (11/28/2015)  . CHF (congestive heart failure) ([employer])   . Degenerative joint disease    Left TKA  . Diastolic heart failure (HCC)    LVEF 65-70% with grade 2 diastolic dysfunction  . ESRD (end stage renal disease) on dialysis [facility])    (as of 09/24/2017) pt does HD at home on a M/Tu/Th/F schedule.  . Essential hypertension, benign 2000   LVH  . GERD (gastroesophageal reflux disease)   . HCAP (healthcare-associated pneumonia) 11/28/2015  . Hyperlipidemia 2000  . Loculated pleural effusion 11/28/2015   [name]  11/28/2015  . Pneumonia 12/2013; 05/2014  . PSVT (paroxysmal supraventricular tachycardia) (HCC)    Nonsustained; asymptomatic; diagnosed by event recorder in 2006  . Tobacco abuse, in remission    20 pack years; discontinued in 1985; bronchitic changes on chest x-ray and 2009  . Type 1 diabetes mellitus ([place]) 1990    Family History  Problem Relation Age of Onset  . Diabetes Father   . Hypertension Father   . Diabetes Mother   . Hypertension Mother    Social History:   reports that he has never smoked. He has never used smokeless tobacco. He reports previous alcohol use. He reports that he does not use drugs.  Medications  Current Facility-Administered Medications:  .  0.9 %  sodium chloride infusion, 100 mL, Intravenous, PRN, Elmarie Shiley, MD .  0.9 %  sodium chloride infusion, 100 mL, Intravenous, PRN, Elmarie Shiley, MD .  0.9 %  sodium chloride infusion, , Intravenous, PRN, Collier Bullock, MD, Last Rate: 10 mL/hr at 06/28/19 1000, Rate Verify at 06/28/19 1000 .  0.9 %  sodium chloride infusion, , Intravenous, Continuous, Collier Bullock, MD, Last Rate: 75 mL/hr at 06/28/19 1200, Rate Verify at 06/28/19 1200 .  aspirin chewable tablet 81 mg, 81 mg, Per Tube, Daily, Collier Bullock, MD, 81 mg at 06/28/19 1128 .  chlorhexidine gluconate (MEDLINE KIT) (PERIDEX) 0.12 % solution 15 mL, 15 mL, Mouth Rinse, BID, Collier Bullock, MD, 15 mL at 06/29/19 0800 .  Chlorhexidine Gluconate Cloth 2 %  PADS 6 each, 6 each, Topical, Q0600, Elmarie Shiley, MD, 6 each at 06/29/19 0532 .  dextrose 5 %-0.45 % sodium chloride infusion, , Intravenous, Continuous, Collier Bullock, MD, Last Rate: 75 mL/hr at 06/29/19 0800, Rate Verify at 06/29/19 0800 .  dextrose 50 % solution 0-50 mL, 0-50 mL, Intravenous, PRN, Gleason, [name], PA-C .  dextrose 50 % solution 0-50 mL, 0-50 mL, Intravenous, PRN, Collier Bullock, MD .  docusate sodium (COLACE) capsule 100 mg, 100 mg, Oral, BID PRN, Gleason, [name], PA-C .   heparin injection 1,000 Units, 1,000 Units, Dialysis, PRN, Elmarie Shiley, MD .  heparin injection 4,100 Units, 40 Units/kg, Dialysis, PRN, Elmarie Shiley, MD .  heparin injection 5,000 Units, 5,000 Units, Subcutaneous, Q8H, Gleason, [name], PA-C, 5,000 Units at 06/29/19 0531 .  insulin regular, human (MYXREDLIN) 100 units/ 100 mL infusion, , Intravenous, Continuous, Collier Bullock, MD, Last Rate: 1.3 mL/hr at 06/29/19 0800, Rate Verify at 06/29/19 0800 .  MEDLINE mouth rinse, 15 mL, Mouth Rinse, 10 times per day, Collier Bullock, MD, 15 mL at 06/29/19 0533 .  norepinephrine (LEVOPHED) 48m in 2561mpremix infusion, 0-40 mcg/min, Intravenous, Continuous, Gleason, LaOtilio CarpenPA-C, Last Rate: 11.25 mL/hr at 06/29/19 0800, 3 mcg/min at 06/29/19 0800 .  pantoprazole sodium (PROTONIX) 40 mg/20 mL oral suspension 40 mg, 40 mg, Per Tube, QHAlden HippMD, 40 mg at 06/28/19 2138 .  polyethylene glycol (MIRALAX / GLYCOLAX) packet 17 g, 17 g, Oral, Daily PRN, Gleason, LaOtilio CarpenPA-C .  sodium chloride flush (NS) 0.9 % injection 10-40 mL, 10-40 mL, Intracatheter, Q12H, GoCollier BullockMD, 10 mL at 06/28/19 2138 .  sodium chloride flush (NS) 0.9 % injection 10-40 mL, 10-40 mL, Intracatheter, PRN, GoCollier BullockMD .  ticagrelor (BDover Emergency Roomtablet 90 mg, 90 mg, Per Tube, BID, GoCollier BullockMD, 90 mg at 06/28/19 2138  ROS:   Unable to obtain due to altered mental status.    Exam: Current vital signs: BP 120/65 (BP Location: Left Arm)   Pulse 93   Temp 98.8 F (37.1 C) (Esophageal)   Resp 12   Ht '6\' 3"'  (1.905 m)   Wt 108.2 kg   SpO2 99%   BMI 29.82 kg/m  Vital signs in last 24 hours: Temp:  [96.8 F (36 C)-98.8 F (37.1 C)] 98.8 F (37.1 C) (05/18 0800) Pulse Rate:  [76-94] 93 (05/18 0800) Resp:  [12-27] 12 (05/18 0800) BP: (101-143)/(46-84) 120/65 (05/18 0800) SpO2:  [99 %-100 %] 99 % (05/18 0800) Arterial Line BP: (81-136)/(46-60) 108/52 (05/18 0800) FiO2 (%):  [40 %-50 %] 40 %  (05/18 0800) Weight:  [108.2 kg] 108.2 kg (05/18 0500)   Constitutional: Appears well-developed and well-nourished.  Eyes: No scleral injection, scleral edema HENT: No OP obstrucion Head: Normocephalic.  Cardiovascular: Normal rate and regular rhythm.  Respiratory: Effort normal, non-labored breathing GI: Soft.  No distension. There is no tenderness.  Skin: WDI  Neuro: Mental Status: Patient does not respond to verbal stimuli.  Does not respond to deep sternal rub.  Does not follow commands.  No verbalizations noted intubated and not breathing over the vent. Cranial Nerves: II: patient does not respond confrontation bilaterally,  [name],IV,VI: doll's response absent bilaterally. pupils right 2 mm, left 3 mm,and nonreactive bilaterally V,VII: corneal reflex absent bilaterally  VIII: patient does not respond to verbal stimuli IX,X: gag reflex absent, XI: trapezius strength unable to test bilaterally XII: tongue strength unable to test Motor: Extremities flaccid throughout.  No spontaneous movement  noted.  No purposeful movements noted. Sensory: Does not respond to noxious stimuli in any extremity. Deep Tendon Reflexes:  Absent throughout. Plantars: absent bilaterally Cerebellar: Unable to perform  Labs I have reviewed labs in epic and the results pertinent to this consultation are:   CBC    Component Value Date/Time   WBC 13.2 (H) 06/29/2019 0350   RBC 3.11 (L) 06/29/2019 0350   HGB 10.3 (L) 06/29/2019 0350   HCT 30.5 (L) 06/29/2019 0350   PLT 195 06/29/2019 0350   MCV 98.1 06/29/2019 0350   MCH 33.1 06/29/2019 0350   MCHC 33.8 06/29/2019 0350   RDW 14.7 06/29/2019 0350   LYMPHSABS 1.6 12/01/2018 1248   MONOABS 0.9 12/01/2018 1248   EOSABS 0.1 12/01/2018 1248   BASOSABS 0.1 12/01/2018 1248    CMP     Component Value Date/Time   NA 138 06/29/2019 0350   NA 135 06/03/2018 1128   K 3.4 (L) 06/29/2019 0350   CL 98 06/29/2019 0350   CO2 28 06/29/2019 0350    GLUCOSE 208 (H) 06/29/2019 0350   BUN 25 (H) 06/29/2019 0350   BUN 35 (H) 06/03/2018 1128   CREATININE 8.60 (H) 06/29/2019 0350   CREATININE 7.99 (H) 10/14/2018 1346   CALCIUM 8.1 (L) 06/29/2019 0350   PROT 7.9 10/14/2018 1346   PROT 8.0 06/03/2018 1128   ALBUMIN 2.8 (L) 06/28/2019 0419   ALBUMIN 4.4 06/03/2018 1128   AST 11 10/14/2018 1346   ALT 15 10/14/2018 1346   ALKPHOS 128 (H) 06/03/2018 1128   BILITOT 0.7 10/14/2018 1346   BILITOT 0.4 06/03/2018 1128   GFRNONAA 6 (L) 06/29/2019 0350   GFRNONAA 7 (L) 10/14/2018 1346   GFRAA 8 (L) 06/29/2019 0350   GFRAA 8 (L) 10/14/2018 1346    Lipid Panel     Component Value Date/Time   CHOL 220 (H) 06/03/2018 1128   TRIG 134 06/03/2018 1128   HDL 53 06/03/2018 1128   CHOLHDL 4.2 06/03/2018 1128   CHOLHDL 4.5 12/28/2013 0707   VLDL 46 (H) 12/28/2013 0707   LDLCALC 140 (H) 06/03/2018 1128     Imaging I have reviewed the images obtained:  CT-scan of the brain-reveals diffuse bilateral sulcal I edema and loss of gray-white matter differential concerning for global anoxic injury.  Negative for herniation at that time.  EEG-the study is suggestive of profound diffuse encephalopathy, nonspecific to etiology but most likely related to diffuse anoxic/hypoxic brain injury.  No seizures or epileptiform discharges were seen throughout the recording.  Etta Quill PA-C [facility] [phone]  M-F  (9:00 am- 5:00 PM)  06/29/2019, 9:23 AM   Attending addendum Patient seen and examined at the request of critical care attending Dr. Leory Plowman Icard this morning. Agree with the history and physical documented above. I have independently obtained history and examined the patient.  I have also independently reviewed the chart.  Subjective: 49 year old man with past history of diabetes, PSVT, hyperlipidemia, hypertension and diastolic heart failure along with aortic stenosis and history of tobacco abuse, ESRD on home dialysis,  brought in after a witnessed cardiac arrest-V. fib arrest which required 30 minutes of CPR prior to ROSC.  Initiated on cooling protocol to 36 Celsius, and rewarmed at 2 AM this morning. Initial evaluation with a CT head suggestive of bilateral diffuse sulcal edema and loss of gray-white matter differentiation-suggestive of possible global hypoxic/anoxic injury. Blood glucose on arrival 1150, DKA. Review of systems cannot be obtained due to his mentation Patient unable  to provide any past history of social history.  Objective: On my examination, patient is currently ventilated, not on any sedation, did not require sedation or paralytics during cooling phase or upon rewarming.  No spontaneous movements noted.  Mild pupillary asymmetry with left pupil larger than the right by millimeter.  Absent corneal reflexes.  Breathing with the ventilator.  Absent corneals and gag.  Symmetric face.  No movements noted to noxious stimulation.  Imaging/diagnostics: I have independently reviewed imaging obtained: CT imaging with bilateral sulcal edema and loss of gray-white differentiation. EEG suggestive of profound encephalopathy-likely anoxic/hypoxic brain injury. Labs remarkable for hyperglycemia, deranged renal function with creatinine of 8.6, BUN 25 and a GFR of 8.  Leukocytosis with white count of 13.2.  Hemoglobin 10.3.  Assessment: 49 year old man with above past medical history status post cardiac arrest with at least 30-minute of downtime prior to ROSC.  Also in DKA on arrival. Clinical examination, with the caveat that this is around 12 hours after rewarming, shows absence of brainstem reflexes and cortical function. Imaging on arrival in the form of CT head suggestive of possible global anoxic brain injury.  EEG also supportive of the global hypoxic/anoxic injury. Per current guidelines, prognostication in a patient who has been on TTM requires the patient to be normothermic at least 72 hours, which is  an option in his case but given the poor exam at arrival and already visualized hypoxic/ischemic changes even prior to initiation of the TTM, I would expect the prognosis to be poor for neurologically meaningful recovery.  Recommendations: Obtain an MRI of the brain without contrast to assess for the extent of anoxic/hypoxic damage If the MRI confirms global anoxic/hypoxic damage, discussions can be made with the family regarding further goals of care with the understanding that the chances of any neurologically meaningful recovery, given comorbidities and imaging findings would be really poor. I discussed my plan with Dr. Valeta Harms as well as the family at bedside-father, mother and wife. I will follow up after the MRI is obtained.  -- Amie Portland, MD [facility] Pager: [phone] If 7pm to 7am, please call on call as listed on AMION.  CRITICAL CARE ATTESTATION Performed by: Amie Portland, MD Total critical care time: 31 minutes Critical care time was exclusive of separately billable procedures and treating other patients and/or supervising APPs/Residents/Students Critical care was necessary to treat or prevent imminent or life-threatening deterioration due to anoxic brain injury This patient is critically ill and at significant risk for neurological worsening and/or death and care requires constant monitoring. Critical care was time spent personally by me on the following activities: development of treatment plan with patient and/or surrogate as well as nursing, discussions with consultants, evaluation of patient's response to treatment, examination of patient, obtaining history from patient or surrogate, ordering and performing treatments and interventions, ordering and review of laboratory studies, ordering and review of radiographic studies, pulse oximetry, re-evaluation of patient's condition, participation in multidisciplinary rounds and medical decision making of high complexity  in the care of this patient.

## 2019-06-29 NOTE — Progress Notes (Signed)
Patient was transport to MRI & back to 2H25 without any complications.

## 2019-06-30 LAB — BASIC METABOLIC PANEL
Anion gap: 15 (ref 5–15)
BUN: 33 mg/dL — ABNORMAL HIGH (ref 6–20)
CO2: 25 mmol/L (ref 22–32)
Calcium: 8.3 mg/dL — ABNORMAL LOW (ref 8.9–10.3)
Chloride: 97 mmol/L — ABNORMAL LOW (ref 98–111)
Creatinine, Ser: 11.82 mg/dL — ABNORMAL HIGH (ref 0.61–1.24)
GFR calc Af Amer: 5 mL/min — ABNORMAL LOW (ref >60)
GFR calc non Af Amer: 4 mL/min — ABNORMAL LOW (ref >60)
Glucose, Bld: 272 mg/dL — ABNORMAL HIGH (ref 70–99)
Potassium: 4.8 mmol/L (ref 3.5–5.1)
Sodium: 137 mmol/L (ref 135–145)

## 2019-06-30 LAB — GLUCOSE, CAPILLARY
Glucose-Capillary: 184 mg/dL — ABNORMAL HIGH (ref 70–99)
Glucose-Capillary: 199 mg/dL — ABNORMAL HIGH (ref 70–99)
Glucose-Capillary: 246 mg/dL — ABNORMAL HIGH (ref 70–99)
Glucose-Capillary: 248 mg/dL — ABNORMAL HIGH (ref 70–99)
Glucose-Capillary: 257 mg/dL — ABNORMAL HIGH (ref 70–99)
Glucose-Capillary: 273 mg/dL — ABNORMAL HIGH (ref 70–99)
Glucose-Capillary: 293 mg/dL — ABNORMAL HIGH (ref 70–99)

## 2019-06-30 MED ORDER — ACETAMINOPHEN 325 MG PO TABS: 650.0000 mg | ORAL_TABLET | Freq: Four times a day (QID) | ORAL | Status: DC | PRN

## 2019-06-30 MED ORDER — MORPHINE SULFATE (PF) 2 MG/ML IV SOLN: 2.0000 mg | INTRAVENOUS | Status: DC | PRN

## 2019-06-30 MED ORDER — DIPHENHYDRAMINE HCL 50 MG/ML IJ SOLN: 25.0000 mg | INTRAMUSCULAR | Status: DC | PRN

## 2019-06-30 MED ORDER — MORPHINE BOLUS VIA INFUSION
5.0000 mg | INTRAVENOUS | Status: DC | PRN
  Filled 2019-06-30: qty 5

## 2019-06-30 MED ORDER — GLYCOPYRROLATE 0.2 MG/ML IJ SOLN: 0.2000 mg | INTRAMUSCULAR | Status: DC | PRN

## 2019-06-30 MED ORDER — HEPARIN SODIUM (PORCINE) 1000 UNIT/ML DIALYSIS: 3000.0000 [IU] | Freq: Once | INTRAMUSCULAR | Status: DC

## 2019-06-30 MED ORDER — GLYCOPYRROLATE 1 MG PO TABS
1.0000 mg | ORAL_TABLET | ORAL | Status: DC | PRN
  Filled 2019-06-30: qty 1

## 2019-06-30 MED ORDER — DEXTROSE 5 % IV SOLN: INTRAVENOUS | Status: DC

## 2019-06-30 MED ORDER — ACETAMINOPHEN 650 MG RE SUPP: 650.0000 mg | Freq: Four times a day (QID) | RECTAL | Status: DC | PRN

## 2019-06-30 MED ORDER — POLYVINYL ALCOHOL 1.4 % OP SOLN: 1.0000 [drp] | Freq: Four times a day (QID) | OPHTHALMIC | Status: DC | PRN

## 2019-06-30 MED ORDER — MORPHINE 100MG IN NS 100ML (1MG/ML) PREMIX INFUSION
0.0000 mg/h | INTRAVENOUS | Status: DC
  Administered 2019-06-30: 5 mg/h via INTRAVENOUS
  Filled 2019-06-30: qty 100

## 2019-07-06 DIAGNOSIS — E440 Moderate protein-calorie malnutrition: Secondary | ICD-10-CM | POA: Diagnosis not present

## 2019-07-06 DIAGNOSIS — R509 Fever, unspecified: Secondary | ICD-10-CM | POA: Diagnosis not present

## 2019-07-06 DIAGNOSIS — K7689 Other specified diseases of liver: Secondary | ICD-10-CM | POA: Diagnosis not present

## 2019-07-06 DIAGNOSIS — N186 End stage renal disease: Secondary | ICD-10-CM | POA: Diagnosis not present

## 2019-07-06 DIAGNOSIS — N2581 Secondary hyperparathyroidism of renal origin: Secondary | ICD-10-CM | POA: Diagnosis not present

## 2019-07-06 DIAGNOSIS — Z992 Dependence on renal dialysis: Secondary | ICD-10-CM | POA: Diagnosis not present

## 2019-07-08 DIAGNOSIS — E440 Moderate protein-calorie malnutrition: Secondary | ICD-10-CM | POA: Diagnosis not present

## 2019-07-08 DIAGNOSIS — N186 End stage renal disease: Secondary | ICD-10-CM | POA: Diagnosis not present

## 2019-07-08 DIAGNOSIS — R509 Fever, unspecified: Secondary | ICD-10-CM | POA: Diagnosis not present

## 2019-07-08 DIAGNOSIS — N2581 Secondary hyperparathyroidism of renal origin: Secondary | ICD-10-CM | POA: Diagnosis not present

## 2019-07-08 DIAGNOSIS — K7689 Other specified diseases of liver: Secondary | ICD-10-CM | POA: Diagnosis not present

## 2019-07-08 DIAGNOSIS — Z992 Dependence on renal dialysis: Secondary | ICD-10-CM | POA: Diagnosis not present

## 2019-07-09 DIAGNOSIS — R509 Fever, unspecified: Secondary | ICD-10-CM | POA: Diagnosis not present

## 2019-07-09 DIAGNOSIS — E440 Moderate protein-calorie malnutrition: Secondary | ICD-10-CM | POA: Diagnosis not present

## 2019-07-09 DIAGNOSIS — N2581 Secondary hyperparathyroidism of renal origin: Secondary | ICD-10-CM | POA: Diagnosis not present

## 2019-07-09 DIAGNOSIS — N186 End stage renal disease: Secondary | ICD-10-CM | POA: Diagnosis not present

## 2019-07-09 DIAGNOSIS — K7689 Other specified diseases of liver: Secondary | ICD-10-CM | POA: Diagnosis not present

## 2019-07-09 DIAGNOSIS — Z992 Dependence on renal dialysis: Secondary | ICD-10-CM | POA: Diagnosis not present

## 2019-07-12 DIAGNOSIS — K7689 Other specified diseases of liver: Secondary | ICD-10-CM | POA: Diagnosis not present

## 2019-07-12 DIAGNOSIS — R509 Fever, unspecified: Secondary | ICD-10-CM | POA: Diagnosis not present

## 2019-07-12 DIAGNOSIS — Z992 Dependence on renal dialysis: Secondary | ICD-10-CM | POA: Diagnosis not present

## 2019-07-12 DIAGNOSIS — N2581 Secondary hyperparathyroidism of renal origin: Secondary | ICD-10-CM | POA: Diagnosis not present

## 2019-07-12 DIAGNOSIS — E440 Moderate protein-calorie malnutrition: Secondary | ICD-10-CM | POA: Diagnosis not present

## 2019-07-12 DIAGNOSIS — N186 End stage renal disease: Secondary | ICD-10-CM | POA: Diagnosis not present

## 2019-07-13 NOTE — Progress Notes (Signed)
Cooling pads removed and bath given.  Lucius Conn, RN

## 2019-07-13 NOTE — Death Summary Note (Signed)
DEATH SUMMARY   Patient Details  Name: Lance [name] MRN: [id number] DOB: 09/23/1970  Admission/Discharge Information   Admit Date:  July 16, 2019  Date of Death: Date of Death: 19-Jul-2019  Time of Death: Time of Death: 1958  Length of Stay: 3  Referring Physician: [name], MD   Reason(s) for Hospitalization  Cardiac arrest due to hyperkalemia and acidemia.  Diagnoses  Preliminary cause of death:  Secondary Diagnoses (including complications and co-morbidities):  Active Problems:   Cardiac arrest ([place])   Type 1 diabetes mellitus with ketoacidosis without coma ([place])   Encounter for central line placement   Brief Hospital Course (including significant findings, care, treatment, and services provided and events leading to death)  Lance [name] is a 49 year old male with a past medical history of ESRD on home dialysis,Type 1 DM, HFpEF, HL, CAD followed by [facility],DES x2 to the RCA in 04/2019,PAD s/p R BKAwho was in his usual state of health prior to today. His wife states that he woke up this morning feeling nauseated with vomiting and diarrhea. His wife witnessed him try to stand and then sent back into his wheelchair and become unresponsive.EMS arrived 5 minutes later and found patient in V. fib arrest, it he underwent approximately 30 minutes of CPR and ROSC was obtained.   In the ED, EKG with prolonged QTC and K of 8, glucose >700 with HCO3 7, anion gap 30.He was given IV insulin, fluids, bicarb, Ca gluconate and admitted to [facility]. CT head noncon showed no acute intracranial process, loss of gray-white differentiation c/f anoxic brain injury.   Of note, patient had a similar event in 05-19-2017 where he coded in the emergency department after presenting in DKA with hyperkalemia.  Patient's underwent targeted temperature management but unfortunately suffered brain herniation from severe anoxic injury.  Family allowed him to pass in peace on 2019-07-19 at 19:58.    Pertinent Labs and Studies  Significant Diagnostic Studies CT HEAD WO CONTRAST  Result Date: 07/16/19 CLINICAL DATA:  Cardiac arrest, CPR EXAM: CT HEAD WITHOUT CONTRAST TECHNIQUE: Contiguous axial images were obtained from the base of the skull through the vertex without intravenous contrast. COMPARISON:  None. FINDINGS: Brain: No hemorrhage or intracranial mass is visualized. Diffuse bilateral sulcal edema. Loss of gray-white matter differentiation at the basal ganglia with hypodense appearing caudate nuclei. Nonenlarged ventricles. Vascular: No hyperdense vessels.  No unexpected calcification. Skull: Normal. Negative for fracture or focal lesion. Sinuses/Orbits: Mucosal thickening in the ethmoid sinuses. Other: None IMPRESSION: Diffuse bilateral sulcal edema and loss of gray-white matter differentiation concerning for global anoxic injury. Negative for herniation at this time. Electronically Signed   By: Donavan Foil M.D.   On: 07/16/2019 23:33   MR BRAIN WO CONTRAST  Result Date: 06/29/2019 CLINICAL DATA:  Absent neurological reflexes; cardiac arrest. EXAM: MRI HEAD WITHOUT CONTRAST TECHNIQUE: Multiplanar, multiecho pulse sequences of the brain and surrounding structures were obtained without intravenous contrast. COMPARISON:  Noncontrast head CT Jul 16, 2019 FINDINGS: Brain: There is diffuse cortical restricted diffusion and T2/FLAIR hyperintense signal abnormality. Restricted diffusion and T2/FLAIR hyperintense signal abnormality are also present throughout much of the cerebellum. Findings are consistent with diffuse hypoxic ischemic injury. There is marked swelling of the supratentorial and infratentorial brain. Diffuse cerebral sulcal effacement with near complete effacement of the ventricular system and effacement of the basal cisterns. There is also severe posterior fossa mass effect with cerebellar tonsillar herniation and crowding at the level of the foramen magnum. There is no evidence of  intracranial mass. No midline shift or extra-axial fluid collection. No chronic intracranial blood products. Vascular: Expected proximal arterial flow voids. Skull and upper cervical spine: No focal marrow lesion. Sinuses/Orbits: Visualized orbits show no acute finding. Mild paranasal sinus mucosal thickening. Trace fluid within bilateral mastoid air cells These results will be called to the ordering clinician or representative by the Radiologist Assistant, and communication documented in the PACS or [employer]. IMPRESSION: Findings consistent with diffuse hypoxic ischemic injury. There is marked swelling of the supratentorial and infratentorial brain with near complete effacement of the ventricular system and effacement of the basal cisterns. Severe posterior fossa mass effect with cerebellar tonsillar herniation and crowding at the level of the foramen magnum. Electronically Signed   By: Kellie Simmering DO   On: 06/29/2019 13:36   DG CHEST PORT 1 VIEW  Result Date: 06/28/2019 CLINICAL DATA:  Central line placement EXAM: PORTABLE CHEST 1 VIEW COMPARISON:  06/22/2019, 12/01/2018 FINDINGS: Endotracheal tube tip about 5.6 cm superior to the carina. Esophageal tube tip overlies the proximal stomach. New right IJ central venous catheter with tip over the SVC. No right pneumothorax. Mild cardiomegaly with central vascular congestion. Probable trace pleural effusions. Aortic atherosclerosis. IMPRESSION: 1. New right IJ central venous catheter with tip overlying the distal SVC. No pneumothorax 2. Cardiomegaly with vascular congestion and probable small pleural effusions. Electronically Signed   By: Donavan Foil M.D.   On: 06/28/2019 00:56   DG Chest Port 1 View  Result Date: 06/20/2019 CLINICAL DATA:  Cardiac arrest at home EXAM: PORTABLE CHEST 1 VIEW COMPARISON:  December 01, 2018 FINDINGS: There is cardiomegaly. ETT is 2.2 cm above the carina. NG tube is seen below the diaphragm. There is prominence of the  central pulmonary vasculature. No large airspace consolidation or pleural effusion. Streaky atelectasis seen in the right mid lung. Vascular stent overlying the right axilla. No acute osseous abnormality. IMPRESSION: ET tube and NG tube in satisfactory position. Cardiomegaly and pulmonary vascular congestion Electronically Signed   By: Prudencio Pair M.D.   On: 06/21/2019 21:31   EEG adult  Result Date: 06/28/2019 Lora Havens, MD     06/28/2019 10:57 AM Patient Name: ANTWINE AGOSTO MRN: [id number] Epilepsy Attending: Lora Havens Referring Physician/Provider: Elease Etienne, NP Date: 06/28/2019 Duration: 23.27 minutes Patient history: 49 year old male status post cardiac arrest on TTM.  EEG to evaluate for seizures. Level of alertness: Comatose AEDs during EEG study: None Technical aspects: This EEG study was done with scalp electrodes positioned according to the 10-20 International system of electrode placement. Electrical activity was acquired at a sampling rate of 500Hz  and reviewed with a high frequency filter of 70Hz  and a low frequency filter of 1Hz . EEG data were recorded continuously and digitally stored. Description: EEG showed continuous generalized background suppression.  EEG was not reactive to tactile stimuli.  Hyperventilation and photic stimulation were not performed.   ABNORMALITY -Background suppression, generalized IMPRESSION: This study is suggestive of profound diffuse encephalopathy, nonspecific to etiology but most likely related to diffuse anoxic/hypoxic brain injury.  No seizures or epileptiform discharges were seen throughout the recording. Priyanka Barbra Sarks   Overnight EEG with video  Result Date: 06/29/2019 Lora Havens, MD     06/29/2019  2:20 PM Patient Name: ZAYLIN RUNCO MRN: [id number] Epilepsy Attending: Lora Havens Referring Physician/Provider: Elease Etienne, NP Duration: 06/28/2019 1039 to 06/29/2019 1020  Patient history: 49 year old male status  post cardiac arrest on TTM.  EEG to  evaluate for seizures.  Level of alertness: Comatose  AEDs during EEG study: None  Technical aspects: This EEG study was done with scalp electrodes positioned according to the 10-20 International system of electrode placement. Electrical activity was acquired at a sampling rate of 500Hz  and reviewed with a high frequency filter of 70Hz  and a low frequency filter of 1Hz . EEG data were recorded continuously and digitally stored.  Description: EEG showed continuous generalized background suppression.  EEG was not reactive to tactile stimuli.  Hyperventilation and photic stimulation were not performed.    ABNORMALITY -Background suppression, generalized  IMPRESSION: This study is suggestive of profound diffuse encephalopathy, nonspecific to etiology but most likely related to diffuse anoxic/hypoxic brain injury.  No seizures or epileptiform discharges were seen throughout the recording.  Lora Havens   ECHOCARDIOGRAM COMPLETE  Result Date: 06/28/2019    ECHOCARDIOGRAM REPORT   Patient Name:   JANMICHAEL GIRAUD III Date of Exam: 06/28/2019 Medical Rec #:  [id number]            Height:       75.0 in Accession #:    [id number]           Weight:       237.7 lb Date of Birth:  [birth year]            BSA:          2.361 m Patient Age:    5 years             BP:           115/69 mmHg Patient Gender: M                    HR:           82 bpm. Exam Location:  Inpatient Procedure: 2D Echo, Cardiac Doppler and Color Doppler Indications:    Cardiac Arrest I46.9  History:        Patient has prior history of Echocardiogram examinations, most                 recent 09/25/2017. CHF, Aortic Valve Disease; Risk                 Factors:Hypertension, Diabetes, Dyslipidemia, Former Smoker and                 GERD. ESRD. Pleural Effusion.  Sonographer:    Jonelle Sidle Dance Referring Phys: [id number] Francesca Jewett  Sonographer Comments: Echo performed with patient supine and on artificial respirator.  IMPRESSIONS  1. Left ventricular ejection fraction, by estimation, is 40 to 45%. The left ventricle has mildly decreased function. The left ventricle demonstrates global hypokinesis. Left ventricular diastolic parameters are consistent with Grade II diastolic dysfunction (pseudonormalization). Elevated left ventricular end-diastolic pressure.  2. Right ventricular systolic function is low normal. The right ventricular size is normal.  3. Left atrial size was mildly dilated.  4. The mitral valve is normal in structure. Trivial mitral valve regurgitation. No evidence of mitral stenosis.  5. The aortic valve is tricuspid. Aortic valve regurgitation is not visualized. Mild aortic valve stenosis.  6. The inferior vena cava is dilated in size with <50% respiratory variability, suggesting right atrial pressure of 15 mmHg. Comparison(s): Changes from prior study are noted. EF slightly reduced compared to prior. FINDINGS  Left Ventricle: Left ventricular ejection fraction, by estimation, is 40 to 45%. The left ventricle has mildly decreased function. The left ventricle demonstrates global hypokinesis. The left  ventricular internal cavity size was small. There is no left ventricular hypertrophy. Left ventricular diastolic parameters are consistent with Grade II diastolic dysfunction (pseudonormalization). Elevated left ventricular end-diastolic pressure. Right Ventricle: The right ventricular size is normal. No increase in right ventricular wall thickness. Right ventricular systolic function is low normal. Left Atrium: Left atrial size was mildly dilated. Right Atrium: Right atrial size was normal in size. Pericardium: A small pericardial effusion is present. Mitral Valve: The mitral valve is normal in structure. Moderate to severe mitral annular calcification. Trivial mitral valve regurgitation. No evidence of mitral valve stenosis. Tricuspid Valve: The tricuspid valve is normal in structure. Tricuspid valve regurgitation is  trivial. No evidence of tricuspid stenosis. Aortic Valve: The aortic valve is tricuspid. . There is moderate thickening and moderate calcification of the aortic valve. Aortic valve regurgitation is not visualized. Mild aortic stenosis is present. There is moderate thickening of the aortic valve. There is moderate calcification of the aortic valve. Aortic valve mean gradient measures 9.5 mmHg. Aortic valve peak gradient measures 18.2 mmHg. Aortic valve area, by VTI measures 1.39 cm. Pulmonic Valve: The pulmonic valve was not well visualized. Pulmonic valve regurgitation is trivial. No evidence of pulmonic stenosis. Aorta: The aortic root and ascending aorta are structurally normal, with no evidence of dilitation. Venous: The inferior vena cava is dilated in size with less than 50% respiratory variability, suggesting right atrial pressure of 15 mmHg. IAS/Shunts: No atrial level shunt detected by color flow Doppler.  LEFT VENTRICLE PLAX 2D LVIDd:         5.30 cm  Diastology LVIDs:         4.20 cm  LV e' lateral:   6.85 cm/s LV PW:         1.20 cm  LV E/e' lateral: 14.6 LV IVS:        0.90 cm  LV e' medial:    3.92 cm/s LVOT diam:     2.20 cm  LV E/e' medial:  25.5 LV SV:         54 LV SV Index:   23 LVOT Area:     3.80 cm  RIGHT VENTRICLE             IVC RV Basal diam:  2.90 cm     IVC diam: 2.20 cm RV S prime:     11.30 cm/s TAPSE (M-mode): 1.8 cm LEFT ATRIUM             Index       RIGHT ATRIUM           Index LA diam:        4.30 cm 1.82 cm/m  RA Area:     18.30 cm LA Vol (A2C):   73.1 ml 30.96 ml/m RA Volume:   52.70 ml  22.32 ml/m LA Vol (A4C):   64.4 ml 27.27 ml/m LA Biplane Vol: 74.1 ml 31.38 ml/m  AORTIC VALVE AV Area (Vmax):    1.36 cm AV Area (Vmean):   1.29 cm AV Area (VTI):     1.39 cm AV Vmax:           213.25 cm/s AV Vmean:          142.250 cm/s AV VTI:            0.392 m AV Peak Grad:      18.2 mmHg AV Mean Grad:      9.5 mmHg LVOT Vmax:         76.30  cm/s LVOT Vmean:        48.100 cm/s LVOT  VTI:          0.143 m LVOT/AV VTI ratio: 0.37  AORTA Ao Root diam: 3.40 cm Ao Asc diam:  3.10 cm MITRAL VALVE MV Area (PHT): 2.83 cm     SHUNTS MV Decel Time: 268 msec     Systemic VTI:  0.14 m MV E velocity: 100.00 cm/s  Systemic Diam: 2.20 cm MV A velocity: 77.10 cm/s MV E/A ratio:  1.30 Buford Dresser MD Electronically signed by Buford Dresser MD Signature Date/Time: 06/28/2019/2:38:42 PM    Final     Microbiology Recent Results (from the past 240 hour(s))  SARS Coronavirus 2 by RT PCR (hospital order, performed in [facility] hospital lab) Nasopharyngeal Nasopharyngeal Swab     Status: None   Collection Time: 06/23/2019  9:10 PM   Specimen: Nasopharyngeal Swab  Result Value Ref Range Status   SARS Coronavirus 2 NEGATIVE NEGATIVE Final    Comment: (NOTE) SARS-CoV-2 target nucleic acids are NOT DETECTED. The SARS-CoV-2 RNA is generally detectable in upper and lower respiratory specimens during the acute phase of infection. The lowest concentration of SARS-CoV-2 viral copies this assay can detect is 250 copies / mL. A negative result does not preclude SARS-CoV-2 infection and should not be used as the sole basis for treatment or other patient management decisions.  A negative result may occur with improper specimen collection / handling, submission of specimen other than nasopharyngeal swab, presence of viral mutation(s) within the areas targeted by this assay, and inadequate number of viral copies (<250 copies / mL). A negative result must be combined with clinical observations, patient history, and epidemiological information. Fact Sheet for Patients:   StrictlyIdeas.no Fact Sheet for Healthcare Providers: BankingDealers.co.za This test is not yet approved or cleared  by the [place] FDA and has been authorized for detection and/or diagnosis of SARS-CoV-2 by FDA under an Emergency Use Authorization (EUA).  This EUA will  remain in effect (meaning this test can be used) for the duration of the COVID-19 declaration under Section 564(b)(1) of the Act, 21 U.S.C. section 360bbb-3(b)(1), unless the authorization is terminated or revoked sooner. Performed at [facility], [place] [address] [place], [place] [address]   MRSA PCR Screening     Status: None   Collection Time: 06/25/2019 11:35 PM   Specimen: Nasal Mucosa; Nasopharyngeal  Result Value Ref Range Status   MRSA by PCR NEGATIVE NEGATIVE Final    Comment:        The GeneXpert MRSA Assay (FDA approved for NASAL specimens only), is one component of a comprehensive MRSA colonization surveillance program. It is not intended to diagnose MRSA infection nor to guide or monitor treatment for MRSA infections. Performed at [facility], [place] [address], [place], [place] [address]     Lab Basic Metabolic Panel: Recent Labs  Lab 06/28/19 [id number] 06/28/19 0527 06/28/19 1035 06/28/19 1136 06/28/19 1405 06/28/19 1405 06/28/19 1411 06/28/19 1622 06/28/19 1716 06/29/19 0350 07/04/19 0455  NA 121*   < > 136   < > 137   < > 138 138 139 138 137  K 6.8*   < > 3.4*   < > 3.3*   < > 3.3* 3.2* 3.4* 3.4* 4.8  CL 81*   < > 96*  --  97*  --   --   --  96* 98 97*  CO2 15*   < > 25  --  27  --   --   --  28 28 25   GLUCOSE 1,150*   < > 318*  --  225*  --   --   --  173* 208* 272*  BUN 70*   < > 22*  --  22*  --   --   --  24* 25* 33*  CREATININE 13.06*   < > 6.38*  --  6.88*  --   --   --  7.36* 8.60* 11.82*  CALCIUM 8.3*   < > 8.1*  --  8.1*  --   --   --  8.2* 8.1* 8.3*  MG 2.4  --   --   --   --   --   --   --   --  1.8  --   PHOS 5.1*  --   --   --   --   --   --   --   --   --   --    < > = values in this interval not displayed.   Liver Function Tests: Recent Labs  Lab 06/28/19 0419  ALBUMIN 2.8*   No results for input(s): LIPASE, AMYLASE in the last 168 hours. No results for input(s): AMMONIA in the last 168 hours. CBC: Recent Labs  Lab  06/16/2019 2139 06/22/2019 2139 06/28/19 0515 06/28/19 0527 06/28/19 0932 06/28/19 1136 06/28/19 1411 06/28/19 1622 06/29/19 0350  WBC 10.3  --  17.4*  --   --   --   --   --  13.2*  HGB 11.1*   < > 11.8*   < > 10.9* 10.9* 10.9* 10.5* 10.3*  HCT 35.6*   < > 34.4*   < > 32.0* 32.0* 32.0* 31.0* 30.5*  MCV 104.4*  --  93.0  --   --   --   --   --  98.1  PLT 265  --  280  --   --   --   --   --  195   < > = values in this interval not displayed.   Cardiac Enzymes: No results for input(s): CKTOTAL, CKMB, CKMBINDEX, TROPONINI in the last 168 hours. Sepsis Labs: Recent Labs  Lab 06/26/2019 2122 06/28/2019 2139 06/28/19 0221 06/28/19 0515 06/29/19 0350  WBC  --  10.3  --  17.4* 13.2*  LATICACIDVEN >11.0*  --  8.8*  --   --      Candee Furbish 07/01/2019, 12:36 PM

## 2019-07-13 NOTE — Progress Notes (Signed)
85 mL of Morphine wasted with Trilby Drummer, RN

## 2019-07-13 NOTE — Progress Notes (Signed)
Pt was made comfort care and family were waiting to withdraw care. Once they let us know they were ready, MD notified via Elink, RRT notified and came to bedside to extubate pt. Pt was started on a Morphine drip prior to extubation. At [place] pt went asystole on the monitor. Death was pronounced by primary RN (myself) and Andee Poles, Agricultural consultant. Family were present and at pt's side when he passed.

## 2019-07-13 NOTE — Progress Notes (Signed)
NAME:  Lance [name], MRN:  [id number], DOB:  29-Jun-1970, LOS: 3 ADMISSION DATE:  06/17/2019, CONSULTATION DATE:  07/04/2019 REFERRING MD:  EDP, CHIEF COMPLAINT:  OOH Cardiac Arrest   Brief History   Lance [name] is a 49 year old male with a past medical history of ESRD on home dialysis, Type 1 DM, HFpEF, HL, CAD followed by [facility], DES x2 to the RCA in 05-02-19, PAD s/p R BKA who was in his usual state of health prior to today.  His wife states that he woke up this morning feeling nauseated with vomiting and diarrhea.  His wife witnessed him try to stand and then sent back into his wheelchair and become unresponsive. EMS arrived 5 minutes later and found patient in V. fib arrest, it he underwent approximately 30 minutes of CPR and ROSC was obtained.    In the ED, EKG with prolonged QTC and K of 8, glucose >700 with HCO3 7, anion gap 30. He was given IV insulin, fluids, bicarb, Ca gluconate and admitted to [facility]. CT head noncon showed no acute intracranial process, loss of gray-white differentiation c/f anoxic brain injury.   Of note, patient had a similar event in May 01, 2017 where he coded in the emergency department after presenting in DKA with hyperkalemia. Past Medical History  ESRD on dialysis  T1DM HFpEF  CAD s/p DESx2 to RCA in 2019-05-02 PAD s/p R [place] [facility] Events   5/17: OOH Cardiac arrest   Consults:  Nephrology  Procedures:  Dialysis 5/17  Significant Diagnostic Tests:  CT Head noncon 5/17: Diffuse bilateral sulcal edema and loss of gray-white matter differentiation concerning for global anoxic injury. Negative for herniation at this time.   Micro Data:  -  Antimicrobials:  -   Interim history/subjective:   No issues overnight.  Remains on mechanical life support.  Objective   Blood pressure (!) 108/48, pulse 96, temperature 99.7 F (37.6 C), temperature source Axillary, resp. rate 12, height 6\' 3"  (1.905 m), weight 110.4 kg, SpO2 99 %.    Vent Mode:  PRVC FiO2 (%):  [40 %] 40 % Set Rate:  [12 bmp] 12 bmp Vt Set:  [550 mL] 550 mL PEEP:  [5 cmH20] 5 cmH20 Plateau Pressure:  [19 cmH20-20 cmH20] 19 cmH20   Intake/Output Summary (Last 24 hours) at 01-Jul-2019 1237 Last data filed at Jul 01, 2019 1100 Gross per 24 hour  Intake 562.39 ml  Output 850 ml  Net -287.61 ml   Filed Weights   06/28/19 0825 06/29/19 0500 07/01/19 0500  Weight: 107.8 kg 108.2 kg 110.4 kg    Examination: Gen: Resting in bed on mechanical support HEENT: NCAT Heart: Regular rate rhythm S1-S2 Lungs: Bilateral mechanical breaths Abdomen: Soft nontender nondistended Extremities: no clubbing, cyanosis, or edema Neuro: Does not withdrawal to pain, pupils unresponsive, loss of corneals, no cough no gag  Resolved Hospital Problem list   none  Assessment & Plan:  49 yo man with h/o ESRD on dialysis, CAD s/p DES to RCAx2, HFpEF, T1DM, PAD s/p R BKA presented with OOH cardiac arrest, hyperkalemia, and DKA.   Acute metabolic encephalopathy Severe diffuse anoxic brain injury Witnessed out of hospital V. fib cardiac arrest / acute metabolic encephalopathy  - Continue Levophed to maintain MAP>65 -Overall poor prognosis -We appreciate neurology input -Plans for comfort care transition today. -Withdrawal of life-sustaining treatment order set has been placed.  Acute Hypoxic respiratory failure: -For withdrawal of care today  Type 1 DM with DKA -For withdrawal  of care today  ESRD on home dialysis / hyperkalemia - Nephrology consulted, appreciate recs ; plan for HD 5/19 if improved - Monitor K   CAD s/p DES x2 to RCA / HFpEF / hypertension / hyperlipidemia / prolonged QT  - repeat EKG pending - Continue home ASA, Brilinta  FEN:  - NPO, on D5 at 27mL/hr and has D50 prn. No tube feeds yet   Best practice:  Diet: NPO Pain/Anxiety/Delirium protocol (if indicated): none VAP protocol (if indicated): Per protocol DVT prophylaxis: SQH GI prophylaxis:  Protonix Glucose control: Insulin drip Mobility: BR Code Status: DNR Family Communication: Bedside, 5/17 Disposition: ICU   Garner Nash, DO [facility] 07/08/2019 12:38 PM

## 2019-07-13 NOTE — Procedures (Signed)
Extubation Procedure Note  Patient Details:   Name: Lance [name] DOB: [birth year] MRN: [id number]   Airway Documentation:  Airway 8 mm (Active)  Secured at (cm) 27 cm Jul 07, 2019 1528  Measured From Lips 2019-07-07 [place] 2019/07/07 1528  Secured By [employer] 07-Jul-2019 1528  Tube Holder Repositioned Yes 07-Jul-2019 1528  Cuff Pressure (cm H2O) 26 cm H2O 06/29/19 2102  Site Condition Dry 07-07-2019 1220   Vent end date: (not recorded) Vent end time: (not recorded)   Evaluation  O2 sats: transiently fell during during procedure Complications: No apparent complications Patient did tolerate procedure well. Bilateral Breath Sounds: Clear, Diminished   No Comfort extubation.  Family and RN at bedside. Lacretia Nicks 07-07-2019, 7:47 PM

## 2019-07-13 NOTE — Progress Notes (Addendum)
[facility] Progress Note  Subjective: not responsive. Plans are for withdrawal of care.   Vitals:   07/08/2019 1256 07-08-2019 1300 Jul 08, 2019 1315 Jul 08, 2019 1330  BP:      Pulse: 96 95 94 94  Resp: 12 12 12 12   Temp:      TempSrc:      SpO2: 99% 99% 98% 98%  Weight:      Height:        Exam:  intubated , not responsive  chest cta bilat  cor reg no mrg  abd soft ntnd no ascites  ext no edema, R BKA    R BC aVF+bruit   Neuro unresponsive    Dialysis: [facility] NxStage 2k/45 lactate, 60L vol, 64% full fraction. 450 bfr. 102.5kg  R BC AVF  15ga Hep 3000    - calc 2 ug qd  - velphoro 1gm tid ac/ phoslo 2 ac tid   Assessment/ Plan: 1.  Status post ventricular fibrillation arrest: 25 min of CPR before ROSC. Significant ABI. Planning for withdrawal of care today.  2.  Hyperkalemia: sp emergent HD early Monday am 3.  End-stage renal disease: On home hemodialysis 4 days a week. Will cancel HD for today given plans for withdrawal of care. Will follow from a distance.  4.  Hypotension: on low dose gtt 5.  Hyperglycemia/ DKA: AG 30 > 13 w/ IV insulin and HD. Resolved.  6.  Coronary artery disease status post PTCA     Rob Mozell Hardacre 07-08-2019, 1:45 PM   Recent Labs  Lab 06/28/19 0419 06/28/19 0515 06/28/19 1622 06/28/19 1716 06/29/19 0350 Jul 08, 2019 0455  K 6.8*   < > 3.2*   < > 3.4* 4.8  BUN 70*   < >  --    < > 25* 33*  CREATININE 13.06*   < >  --    < > 8.60* 11.82*  CALCIUM 8.3*   < >  --    < > 8.1* 8.3*  PHOS 5.1*  --   --   --   --   --   HGB  --    < > 10.5*  --  10.3*  --    < > = values in this interval not displayed.   Inpatient medications: . aspirin  81 mg Per Tube Daily  . Chlorhexidine Gluconate Cloth  6 each Topical Q0600  . heparin  5,000 Units Subcutaneous Q8H  . insulin aspart  1-3 Units Subcutaneous Q4H  . insulin detemir  10 Units Subcutaneous Q12H  . pantoprazole sodium  40 mg Per Tube QHS  . sodium chloride flush  10-40 mL Intracatheter  Q12H  . ticagrelor  90 mg Per Tube BID   . sodium chloride 10 mL/hr at 06/28/19 1000  . sodium chloride    . sodium chloride    . insulin Stopped (07/08/19 0148)  . norepinephrine (LEVOPHED) Adult infusion 10 mcg/min (07-08-19 1300)   sodium chloride, sodium chloride, sodium chloride, docusate sodium, morphine injection, polyethylene glycol, sodium chloride flush

## 2019-07-13 NOTE — Plan of Care (Signed)
  Problem: Clinical Measurements: Goal: Will remain free from infection Outcome: Progressing   Problem: Safety: Goal: Ability to remain free from injury will improve Outcome: Progressing   Problem: Clinical Measurements: Goal: Ability to maintain clinical measurements within normal limits will improve Outcome: Not Progressing Goal: Diagnostic test results will improve Outcome: Not Progressing Goal: Respiratory complications will improve Outcome: Not Progressing Goal: Cardiovascular complication will be avoided Outcome: Not Progressing   Problem: Activity: Goal: Risk for activity intolerance will decrease Outcome: Not Progressing   Problem: Nutrition: Goal: Adequate nutrition will be maintained Outcome: Not Progressing   Problem: Skin Integrity: Goal: Risk for impaired skin integrity will decrease Outcome: Not Progressing   Problem: Cardiac: Goal: Ability to achieve and maintain adequate cardiopulmonary perfusion will improve Outcome: Not Progressing   Problem: Neurologic: Goal: Promote progressive neurologic recovery Outcome: Not Progressing   Problem: Skin Integrity: Goal: Risk for impaired skin integrity will be minimized. Outcome: Not Progressing

## 2019-07-13 DEATH — deceased
